# Patient Record
Sex: Male | Born: 1950
Health system: Southern US, Community
[De-identification: ages and names within clinical notes are randomized; demographics above are authoritative.]

## PROBLEM LIST (undated history)

## (undated) DIAGNOSIS — I219 Acute myocardial infarction, unspecified: Secondary | ICD-10-CM

## (undated) DIAGNOSIS — F1990 Other psychoactive substance use, unspecified, uncomplicated: Secondary | ICD-10-CM

## (undated) DIAGNOSIS — T7840XA Allergy, unspecified, initial encounter: Secondary | ICD-10-CM

## (undated) DIAGNOSIS — R0600 Dyspnea, unspecified: Secondary | ICD-10-CM

## (undated) DIAGNOSIS — G4733 Obstructive sleep apnea (adult) (pediatric): Secondary | ICD-10-CM

## (undated) DIAGNOSIS — I739 Peripheral vascular disease, unspecified: Secondary | ICD-10-CM

## (undated) DIAGNOSIS — B029 Zoster without complications: Secondary | ICD-10-CM

## (undated) DIAGNOSIS — R7303 Prediabetes: Secondary | ICD-10-CM

## (undated) DIAGNOSIS — Z8489 Family history of other specified conditions: Secondary | ICD-10-CM

## (undated) DIAGNOSIS — R600 Localized edema: Secondary | ICD-10-CM

## (undated) DIAGNOSIS — I2510 Atherosclerotic heart disease of native coronary artery without angina pectoris: Secondary | ICD-10-CM

## (undated) DIAGNOSIS — Z95810 Presence of automatic (implantable) cardiac defibrillator: Secondary | ICD-10-CM

## (undated) DIAGNOSIS — I509 Heart failure, unspecified: Secondary | ICD-10-CM

## (undated) DIAGNOSIS — G4730 Sleep apnea, unspecified: Secondary | ICD-10-CM

## (undated) DIAGNOSIS — I359 Nonrheumatic aortic valve disorder, unspecified: Secondary | ICD-10-CM

## (undated) DIAGNOSIS — F40243 Fear of flying: Secondary | ICD-10-CM

## (undated) DIAGNOSIS — D649 Anemia, unspecified: Secondary | ICD-10-CM

## (undated) DIAGNOSIS — I4891 Unspecified atrial fibrillation: Secondary | ICD-10-CM

## (undated) DIAGNOSIS — I442 Atrioventricular block, complete: Secondary | ICD-10-CM

## (undated) DIAGNOSIS — F32A Depression, unspecified: Secondary | ICD-10-CM

## (undated) DIAGNOSIS — Z950 Presence of cardiac pacemaker: Secondary | ICD-10-CM

## (undated) DIAGNOSIS — I10 Essential (primary) hypertension: Secondary | ICD-10-CM

## (undated) DIAGNOSIS — M2550 Pain in unspecified joint: Secondary | ICD-10-CM

## (undated) DIAGNOSIS — H409 Unspecified glaucoma: Secondary | ICD-10-CM

## (undated) DIAGNOSIS — R0602 Shortness of breath: Secondary | ICD-10-CM

## (undated) DIAGNOSIS — T8859XA Other complications of anesthesia, initial encounter: Secondary | ICD-10-CM

## (undated) DIAGNOSIS — M1990 Unspecified osteoarthritis, unspecified site: Secondary | ICD-10-CM

## (undated) DIAGNOSIS — F419 Anxiety disorder, unspecified: Secondary | ICD-10-CM

## (undated) DIAGNOSIS — F1010 Alcohol abuse, uncomplicated: Secondary | ICD-10-CM

## (undated) DIAGNOSIS — R011 Cardiac murmur, unspecified: Secondary | ICD-10-CM

## (undated) DIAGNOSIS — I351 Nonrheumatic aortic (valve) insufficiency: Secondary | ICD-10-CM

## (undated) HISTORY — DX: Unspecified glaucoma: H40.9

## (undated) HISTORY — DX: Alcohol abuse, uncomplicated: F10.10

## (undated) HISTORY — DX: Cardiac murmur, unspecified: R01.1

## (undated) HISTORY — PX: CATARACT EXTRACTION: SUR2

## (undated) HISTORY — DX: Presence of automatic (implantable) cardiac defibrillator: Z95.810

## (undated) HISTORY — PX: OTHER SURGICAL HISTORY: SHX169

## (undated) HISTORY — DX: Allergy, unspecified, initial encounter: T78.40XA

## (undated) HISTORY — DX: Essential (primary) hypertension: I10

## (undated) HISTORY — DX: Zoster without complications: B02.9

## (undated) HISTORY — DX: Nonrheumatic aortic (valve) insufficiency: I35.1

## (undated) HISTORY — DX: Other psychoactive substance use, unspecified, uncomplicated: F19.90

## (undated) HISTORY — DX: Obstructive sleep apnea (adult) (pediatric): G47.33

## (undated) HISTORY — PX: EYE SURGERY: SHX253

## (undated) HISTORY — PX: CARDIAC VALVE REPLACEMENT: SHX585

## (undated) HISTORY — DX: Unspecified atrial fibrillation: I48.91

## (undated) HISTORY — PX: CARDIAC CATHETERIZATION: SHX172

## (undated) HISTORY — DX: Atrioventricular block, complete: I44.2

## (undated) HISTORY — DX: Localized edema: R60.0

---
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
## 2005-10-25 ENCOUNTER — Ambulatory Visit: Payer: Self-pay | Admitting: Family Medicine

## 2005-10-25 LAB — CONVERTED CEMR LAB
ALT: 44 units/L — ABNORMAL HIGH (ref 0–40)
AST: 26 units/L (ref 0–37)
Albumin: 3.9 g/dL (ref 3.5–5.2)
Alkaline Phosphatase: 38 units/L — ABNORMAL LOW (ref 39–117)
BUN: 11 mg/dL (ref 6–23)
Basophils Absolute: 0.1 10*3/uL (ref 0.0–0.1)
Basophils Relative: 1 % (ref 0.0–1.0)
CO2: 29 meq/L (ref 19–32)
Calcium: 9.2 mg/dL (ref 8.4–10.5)
Chloride: 104 meq/L (ref 96–112)
Chol/HDL Ratio, serum: 6.4
Cholesterol: 267 mg/dL (ref 0–200)
Creatinine, Ser: 0.9 mg/dL (ref 0.4–1.5)
Eosinophil percent: 2.2 % (ref 0.0–5.0)
GFR calc non Af Amer: 92 mL/min
Glomerular Filtration Rate, Af Am: 112 mL/min/{1.73_m2}
Glucose, Bld: 92 mg/dL (ref 70–99)
HCT: 42.6 % (ref 39.0–52.0)
HDL: 41.5 mg/dL (ref >39.0)
Hemoglobin: 14.4 g/dL (ref 13.0–17.0)
LDL DIRECT: 188.2 mg/dL
Lymphocytes Relative: 34.3 % (ref 12.0–46.0)
MCHC: 33.8 g/dL (ref 30.0–36.0)
MCV: 93.4 fL (ref 78.0–100.0)
Monocytes Absolute: 0.6 10*3/uL (ref 0.2–0.7)
Monocytes Relative: 8.3 % (ref 3.0–11.0)
Neutro Abs: 3.7 10*3/uL (ref 1.4–7.7)
Neutrophils Relative %: 54.2 % (ref 43.0–77.0)
PSA: 0.34 ng/mL (ref 0.10–4.00)
Platelets: 245 10*3/uL (ref 150–400)
Potassium: 4.1 meq/L (ref 3.5–5.1)
RBC: 4.56 M/uL (ref 4.22–5.81)
RDW: 12.6 % (ref 11.5–14.6)
Sodium: 140 meq/L (ref 135–145)
TSH: 3.19 microintl units/mL (ref 0.35–5.50)
Total Bilirubin: 0.8 mg/dL (ref 0.3–1.2)
Total Protein: 6.4 g/dL (ref 6.0–8.3)
Triglyceride fasting, serum: 162 mg/dL — ABNORMAL HIGH (ref 0–149)
VLDL: 32 mg/dL (ref 0–40)
WBC: 6.8 10*3/uL (ref 4.5–10.5)

## 2005-10-29 ENCOUNTER — Ambulatory Visit: Payer: Self-pay | Admitting: Family Medicine

## 2005-12-05 ENCOUNTER — Ambulatory Visit: Payer: Self-pay | Admitting: Family Medicine

## 2006-01-10 ENCOUNTER — Ambulatory Visit: Payer: Self-pay | Admitting: Family Medicine

## 2006-10-09 DIAGNOSIS — I10 Essential (primary) hypertension: Secondary | ICD-10-CM | POA: Insufficient documentation

## 2007-12-15 ENCOUNTER — Ambulatory Visit: Payer: Self-pay | Admitting: Family Medicine

## 2007-12-15 DIAGNOSIS — B029 Zoster without complications: Secondary | ICD-10-CM | POA: Insufficient documentation

## 2008-01-08 ENCOUNTER — Ambulatory Visit: Payer: Self-pay | Admitting: Family Medicine

## 2008-01-08 DIAGNOSIS — B0229 Other postherpetic nervous system involvement: Secondary | ICD-10-CM | POA: Insufficient documentation

## 2010-02-02 NOTE — Assessment & Plan Note (Signed)
Summary: BUMPS ON BACK AND UNDER ARM/JLS   Vital Signs:  Patient Profile:   60 Years Old Male Weight:      249 pounds Temp:     98.6 degrees F oral Pulse rate:   68 / minute Pulse rhythm:   regular BP sitting:   142 / 68  (left arm)  Vitals Entered By: Kern Reap CMA (December 15, 2007 3:41 PM)                 Chief Complaint:  rash.  History of Present Illness: it is a 60 year old male, who comes in today with a rash under right armpit.  He began having pain in the 3 days later he broke out in a rash.  His pain now on a scale of one to 10 as a 5.  It's localized to the right axillary area    Prior Medication List:  ATENOLOL 50 MG  TABS (ATENOLOL) 1/2 every morning ZESTORETIC 20-25 MG  TABS (LISINOPRIL-HYDROCHLOROTHIAZIDE) every morning   Current Allergies: No known allergies   Past Medical History:    Reviewed history from 10/09/2006 and no changes required:       Hypertension       shingles   Social History:    Reviewed history and no changes required:       Occupation:       Married       Never Smoked       Alcohol use-no       Drug use-no       Regular exercise-yes   Risk Factors:  Tobacco use:  never Drug use:  no Alcohol use:  no Exercise:  yes   Review of Systems      See HPI   Physical Exam  General:     Well-developed,well-nourished,in no acute distress; alert,appropriate and cooperative throughout examination Skin:     rash consistent with herpes zoster    Impression & Recommendations:  Problem # 1:  HERPES ZOSTER (ICD-053.9) Assessment: New  Complete Medication List: 1)  Atenolol 50 Mg Tabs (Atenolol) .... 1/2 every morning 2)  Zestoretic 20-25 Mg Tabs (Lisinopril-hydrochlorothiazide) .... Every morning 3)  Zovirax 800 Mg Tabs (Acyclovir) .... Take 1 tablet by mouth three times a day 4)  Vicodin Es 7.5-750 Mg Tabs (Hydrocodone-acetaminophen) .... Take 1 tablet by mouth three times a day   Patient Instructions:  1)  begin Zovirax, 800 mg 3 times a day.  I will also give these and pain pills to take.  Return p.r.n.   Prescriptions: VICODIN ES 7.5-750 MG TABS (HYDROCODONE-ACETAMINOPHEN) Take 1 tablet by mouth three times a day  #40 x 1   Entered and Authorized by:   Roderick Pee MD   Signed by:   Roderick Pee MD on 12/15/2007   Method used:   Print then Give to Patient   RxID:   [id number] ZOVIRAX 800 MG TABS (ACYCLOVIR) Take 1 tablet by mouth three times a day  #50 x 1   Entered and Authorized by:   Roderick Pee MD   Signed by:   Roderick Pee MD on 12/15/2007   Method used:   Electronically to        [facility] [address] (retail)       [address], [address]       [place], [place]  [address]       Ph: [phone]  Fax: [phone]   RxID:   [id number]  ]

## 2010-02-02 NOTE — Assessment & Plan Note (Signed)
Summary: shingles/mhf   Vital Signs:  Patient Profile:   60 Years Old Male Weight:      250 pounds Temp:     98.3 degrees F oral BP sitting:   140 / 78  (left arm) Cuff size:   regular  Vitals Entered By: Kern Reap CMA (January 08, 2008 11:32 AM)                 Chief Complaint:  follow up shingles.  History of Present Illness: that is a 60 year old male, nonsmoker, who comes in today for reevaluation of shingles.  We saw him on December the 14th with acute shingles involving h.  He stating the Zovirax, 800 mg 3 times a day, and the red spots have premature resolved, but not completely gone.  now he has developed some tingling and pain in his posterior right shoulder where there was no rash.    Prior Medication List:  ATENOLOL 50 MG  TABS (ATENOLOL) 1/2 every morning ZESTORETIC 20-25 MG  TABS (LISINOPRIL-HYDROCHLOROTHIAZIDE) every morning ZOVIRAX 800 MG TABS (ACYCLOVIR) Take 1 tablet by mouth three times a day VICODIN ES 7.5-750 MG TABS (HYDROCODONE-ACETAMINOPHEN) Take 1 tablet by mouth three times a day   Current Allergies: No known allergies   Past Medical History:    Reviewed history from 12/15/2007 and no changes required:       Hypertension       shingles   Social History:    Reviewed history from 12/15/2007 and no changes required:       Occupation:       Married       Never Smoked       Alcohol use-no       Drug use-no       Regular exercise-yes    Review of Systems      See HPI   Physical Exam  General:     Well-developed,well-nourished,in no acute distress; alert,appropriate and cooperative throughout examination Skin:     there are still some residual zoster lesions, although there not vesiculated.  Posterior right shoulder is tender to touch.  No rash    Impression & Recommendations:  Problem # 1:  POSTHERPETIC NEURALGIA (ICD-053.19) Assessment: New  Problem # 2:  HERPES ZOSTER (ICD-053.9) Assessment: Improved   Complete Medication List: 1)  Atenolol 50 Mg Tabs (Atenolol) .... 1/2 every morning 2)  Zestoretic 20-25 Mg Tabs (Lisinopril-hydrochlorothiazide) .... Every morning 3)  Zovirax 800 Mg Tabs (Acyclovir) .... Take 1 tablet by mouth three times a day 4)  Vicodin Es 7.5-750 Mg Tabs (Hydrocodone-acetaminophen) .... Take 1 tablet by mouth three times a day 5)  Prednisone 20 Mg Tabs (Prednisone) .... Uad   Patient Instructions: 1)  take any med around of the Zovirax, 800 mg 3 times a day.  Also add prednisone two tablets daily for 3 days, one for 3 days, a half a tablet a day for 3 days, then half a tablet Monday, Wednesday, Friday, for a 3-week taper.  Return p.r.n.   Prescriptions: ZOVIRAX 800 MG TABS (ACYCLOVIR) Take 1 tablet by mouth three times a day  #50 x 1   Entered and Authorized by:   Roderick Pee MD   Signed by:   Roderick Pee MD on 01/08/2008   Method used:   Electronically to        [facility] [address] (retail)       [address], [address]  [place], [place]  [address]       Ph: [phone]       Fax: [phone]   RxID:   [id number] PREDNISONE 20 MG TABS (PREDNISONE) UAD  #50 x 1   Entered and Authorized by:   Roderick Pee MD   Signed by:   Roderick Pee MD on 01/08/2008   Method used:   Electronically to        [facility] [address] (retail)       [address]       [place], [place]  [address]       Ph: [phone]       Fax: [phone]   RxID:   [id number]  ]
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
## 2011-01-03 ENCOUNTER — Ambulatory Visit (INDEPENDENT_AMBULATORY_CARE_PROVIDER_SITE_OTHER): Payer: Self-pay | Admitting: Internal Medicine

## 2011-01-03 ENCOUNTER — Encounter: Payer: Self-pay | Admitting: Internal Medicine

## 2011-01-03 DIAGNOSIS — R011 Cardiac murmur, unspecified: Secondary | ICD-10-CM

## 2011-01-03 DIAGNOSIS — J069 Acute upper respiratory infection, unspecified: Secondary | ICD-10-CM

## 2011-01-03 MED ORDER — HYDROCODONE-HOMATROPINE 5-1.5 MG/5ML PO SYRP: 5.0000 mL | ORAL_SOLUTION | Freq: Two times a day (BID) | ORAL | Status: AC | PRN

## 2011-01-03 NOTE — Assessment & Plan Note (Signed)
61 year old white male with probable viral URI. We discussed symptomatic treatment. Patient advised to office if symptoms persist or worsen.

## 2011-01-03 NOTE — Assessment & Plan Note (Signed)
Patient with new heart murmur.  He denies chest pain but has dyspnea on exertion which he attributes to obesity.  Patient advised to follow up with PCP re: murmur and possible evaluation with 2 D Echo.

## 2011-01-03 NOTE — Progress Notes (Signed)
  Subjective:    Patient ID: Gregory [name], male    DOB: 1950/05/02, 61 y.o.   MRN: [id number]  URI  This is a new problem. The current episode started in the past 7 days. There has been no fever. Associated symptoms include congestion, coughing and a sore throat. Pertinent negatives include no diarrhea or vomiting. He has tried nothing for the symptoms.      Review of Systems  HENT: Positive for congestion and sore throat.   Respiratory: Positive for cough.   Gastrointestinal: Negative for vomiting and diarrhea.       Past Medical History  Diagnosis Date  . Hypertension   . Shingles     History   Social History  . Marital Status: Married    Spouse Name: N/A    Number of Children: N/A  . Years of Education: N/A   Occupational History  . Not on file.   Social History Main Topics  . Smoking status: Never Smoker   . Smokeless tobacco: Not on file  . Alcohol Use: No  . Drug Use: No  . Sexually Active:    Other Topics Concern  . Not on file   Social History Narrative  . No narrative on file    No past surgical history on file.  No family history on file.  No Known Allergies  No current outpatient prescriptions on file prior to visit.    BP 152/74  Temp(Src) 98.2 F (36.8 C) (Oral)  Ht 5\' 8"  (1.727 m)  Wt 255 lb (115.667 kg)  BMI 38.77 kg/m2    Objective:   Physical Exam  Constitutional: He appears well-developed and well-nourished.  HENT:  Head: Normocephalic and atraumatic.  Right Ear: External ear normal.  Left Ear: External ear normal.  Mouth/Throat: No oropharyngeal exudate.       Slight oropharyngeal erythema  Cardiovascular: Normal rate and regular rhythm.        Systolic ejection murmur 2/6 right sternal border, lower frequency  Pulmonary/Chest: Effort normal and breath sounds normal. No respiratory distress. He has no wheezes. He has no rales.  Lymphadenopathy:    He has no cervical adenopathy.  Skin: Skin is warm and dry.    Psychiatric: He has a normal mood and affect. His behavior is normal.       Assessment & Plan:

## 2011-01-03 NOTE — Patient Instructions (Signed)
Please follow up with Dr. Tawanna Cooler within 1-2 weeks re:  Elevated blood pressure and heart murmur Please call our office if your symptoms do not improve or gets worse.

## 2011-01-17 ENCOUNTER — Other Ambulatory Visit (INDEPENDENT_AMBULATORY_CARE_PROVIDER_SITE_OTHER): Payer: Self-pay

## 2011-01-17 DIAGNOSIS — Z0000 Encounter for general adult medical examination without abnormal findings: Secondary | ICD-10-CM

## 2011-01-17 LAB — LIPID PANEL
Cholesterol: 218 mg/dL — ABNORMAL HIGH (ref 0–200)
HDL: 47.5 mg/dL
Total CHOL/HDL Ratio: 5
Triglycerides: 135 mg/dL (ref 0.0–149.0)
VLDL: 27 mg/dL (ref 0.0–40.0)

## 2011-01-17 LAB — CBC WITH DIFFERENTIAL/PLATELET
Basophils Absolute: 0 10*3/uL (ref 0.0–0.1)
Basophils Relative: 0.4 % (ref 0.0–3.0)
Eosinophils Absolute: 0.2 10*3/uL (ref 0.0–0.7)
Eosinophils Relative: 2.5 % (ref 0.0–5.0)
HCT: 38.9 % — ABNORMAL LOW (ref 39.0–52.0)
Hemoglobin: 13 g/dL (ref 13.0–17.0)
Lymphocytes Relative: 23.1 % (ref 12.0–46.0)
Lymphs Abs: 1.9 10*3/uL (ref 0.7–4.0)
MCHC: 33.4 g/dL (ref 30.0–36.0)
MCV: 96.1 fl (ref 78.0–100.0)
Monocytes Absolute: 0.6 10*3/uL (ref 0.1–1.0)
Monocytes Relative: 6.8 % (ref 3.0–12.0)
Neutro Abs: 5.7 10*3/uL (ref 1.4–7.7)
Neutrophils Relative %: 67.2 % (ref 43.0–77.0)
Platelets: 276 10*3/uL (ref 150.0–400.0)
RBC: 4.05 Mil/uL — ABNORMAL LOW (ref 4.22–5.81)
RDW: 13.4 % (ref 11.5–14.6)
WBC: 8.4 10*3/uL (ref 4.5–10.5)

## 2011-01-17 LAB — HEPATIC FUNCTION PANEL
ALT: 35 U/L (ref 0–53)
AST: 26 U/L (ref 0–37)
Albumin: 3.8 g/dL (ref 3.5–5.2)
Alkaline Phosphatase: 44 U/L (ref 39–117)
Bilirubin, Direct: 0.1 mg/dL (ref 0.0–0.3)
Total Bilirubin: 0.7 mg/dL (ref 0.3–1.2)
Total Protein: 6.4 g/dL (ref 6.0–8.3)

## 2011-01-17 LAB — BASIC METABOLIC PANEL
BUN: 12 mg/dL (ref 6–23)
CO2: 29 mEq/L (ref 19–32)
Calcium: 8.7 mg/dL (ref 8.4–10.5)
Chloride: 104 mEq/L (ref 96–112)
Creatinine, Ser: 0.8 mg/dL (ref 0.4–1.5)
GFR: 101.05 mL/min (ref >60.00)
Glucose, Bld: 98 mg/dL (ref 70–99)
Potassium: 4.4 mEq/L (ref 3.5–5.1)
Sodium: 142 mEq/L (ref 135–145)

## 2011-01-17 LAB — POCT URINALYSIS DIPSTICK
Bilirubin, UA: NEGATIVE
Blood, UA: NEGATIVE
Glucose, UA: NEGATIVE
Ketones, UA: NEGATIVE
Leukocytes, UA: NEGATIVE
Nitrite, UA: NEGATIVE
Protein, UA: NEGATIVE
Spec Grav, UA: 1.02
Urobilinogen, UA: 0.2
pH, UA: 6

## 2011-01-17 LAB — PSA: PSA: 0.44 ng/mL (ref 0.10–4.00)

## 2011-01-17 LAB — LDL CHOLESTEROL, DIRECT: Direct LDL: 145 mg/dL

## 2011-01-18 LAB — TSH: TSH: 2.73 u[IU]/mL (ref 0.35–5.50)

## 2011-02-13 ENCOUNTER — Other Ambulatory Visit: Payer: Self-pay | Admitting: Family Medicine

## 2011-02-13 ENCOUNTER — Encounter: Payer: Self-pay | Admitting: Family Medicine

## 2011-02-13 ENCOUNTER — Ambulatory Visit (INDEPENDENT_AMBULATORY_CARE_PROVIDER_SITE_OTHER): Payer: Self-pay | Admitting: Family Medicine

## 2011-02-13 DIAGNOSIS — R011 Cardiac murmur, unspecified: Secondary | ICD-10-CM

## 2011-02-13 DIAGNOSIS — I10 Essential (primary) hypertension: Secondary | ICD-10-CM

## 2011-02-13 MED ORDER — LISINOPRIL-HYDROCHLOROTHIAZIDE 20-12.5 MG PO TABS: 1.0000 | ORAL_TABLET | Freq: Every day | ORAL | Status: DC

## 2011-02-13 NOTE — Progress Notes (Signed)
  Subjective:    Patient ID: Gregory [name], male    DOB: 1950/07/12, 61 y.o.   MRN: [id number]  HPI Gregory [name] is a 61 year old married male nonsmoker who comes in today for a physical examination  I have not seen him in many years. He has a history of hypertension and was on medication which he stopped 3 years ago. His blood pressure today is 220/70. He states he's asymptomatic.  He states he was here a couple weeks ago and saw Dr. you who told him he had a heart murmur. He's never had a history of a heart murmur in the past.  He gets routine eye care, dental care, never has had a colonoscopy and tetanus booster has been greater than 10 years ago.   Review of Systems  Constitutional: Negative.   HENT: Negative.   Eyes: Negative.   Respiratory: Negative.   Cardiovascular: Negative.   Gastrointestinal: Negative.   Genitourinary: Negative.   Musculoskeletal: Negative.   Skin: Negative.   Neurological: Negative.   Hematological: Negative.   Psychiatric/Behavioral: Negative.        Objective:   Physical Exam  Constitutional: He is oriented to person, place, and time. He appears well-developed and well-nourished.  HENT:  Head: Normocephalic and atraumatic.  Right Ear: External ear normal.  Left Ear: External ear normal.  Nose: Nose normal.  Mouth/Throat: Oropharynx is clear and moist.  Eyes: Conjunctivae and EOM are normal. Pupils are equal, round, and reactive to light.  Neck: Normal range of motion. Neck supple. No JVD present. No tracheal deviation present. No thyromegaly present.  Cardiovascular: Normal rate, regular rhythm and intact distal pulses.  Exam reveals no gallop and no friction rub.   Murmur heard.      There is a grade 3/6 systolic ejection murmur heard best at the aortic area  BP 220/70 at 4:12 PM he was given 0.4 mg of clonidine and 10 mg of a beta blocker and blood pressure was monitored every 15 minutes x4  Pulmonary/Chest: Effort normal and breath sounds  normal. No stridor. No respiratory distress. He has no wheezes. He has no rales. He exhibits no tenderness.  Abdominal: Soft. Bowel sounds are normal. He exhibits no distension and no mass. There is no tenderness. There is no rebound and no guarding.  Genitourinary: Rectum normal, prostate normal and penis normal. Guaiac negative stool. No penile tenderness.  Musculoskeletal: Normal range of motion. He exhibits no edema and no tenderness.  Lymphadenopathy:    He has no cervical adenopathy.  Neurological: He is alert and oriented to person, place, and time. He has normal reflexes. No cranial nerve deficit. He exhibits normal muscle tone.  Skin: Skin is warm and dry. No rash noted. No erythema. No pallor.  Psychiatric: He has a normal mood and affect. His behavior is normal. Judgment and thought content normal.          Assessment & Plan:  Hypertension,,,,,,,,,,,,, begin Zestoretic monitor BP followup in 48 hours  Obesity weight 258 pounds height 67-1/2 inches tall recommend diet exercise and weight loss  Heart murmur set up for a 2-D echo for eval

## 2011-02-13 NOTE — Patient Instructions (Signed)
Take one at the Zestoretic tablet when you get home now then starting tomorrow morning one tablet every morning  Check your blood pressure 3 times daily  Complete salt free diet  Return on Thursday for followup with you all your blood pressure readings and the device  We will get you set up in cardiology to evaluate your heart murmur

## 2011-02-15 ENCOUNTER — Ambulatory Visit (INDEPENDENT_AMBULATORY_CARE_PROVIDER_SITE_OTHER): Payer: Self-pay | Admitting: Family Medicine

## 2011-02-15 ENCOUNTER — Encounter: Payer: Self-pay | Admitting: Family Medicine

## 2011-02-15 DIAGNOSIS — I10 Essential (primary) hypertension: Secondary | ICD-10-CM

## 2011-02-15 DIAGNOSIS — I359 Nonrheumatic aortic valve disorder, unspecified: Secondary | ICD-10-CM

## 2011-02-15 DIAGNOSIS — I351 Nonrheumatic aortic (valve) insufficiency: Secondary | ICD-10-CM | POA: Insufficient documentation

## 2011-02-15 NOTE — Progress Notes (Signed)
  Subjective:    Patient ID: Gregory [name], male    DOB: 1950/05/24, 61 y.o.   MRN: [id number]  HPI  ed is a 61 year old male who comes in today for evaluation of hypertension  We saw him the other day for the first time in many years. His blood pressure was up and he had a new heart murmur. We started Zestoretic 20-12.5 daily BP now 160/70. Pressure the other day was 220 systolic no side effects from medication  Echocardiogram set up for tomorrow  Review of Systems Cardiopulmonary his systems otherwise negative    Objective:   Physical Exam  Well-developed well-nourished male in no acute distress cardiac exam shows a murmur consistent with aortic insufficiency grade 3/6 BP right arm sitting position 160/70      Assessment & Plan:  Hypertension with aortic insufficiency continue current medication echocardiogram tomorrow followup after echo

## 2011-02-15 NOTE — Patient Instructions (Signed)
I will call you I gets a report on your echocardiogram

## 2011-02-16 ENCOUNTER — Other Ambulatory Visit: Payer: Self-pay

## 2011-02-16 ENCOUNTER — Ambulatory Visit (HOSPITAL_COMMUNITY): Payer: Self-pay | Attending: Family Medicine | Admitting: Radiology

## 2011-02-16 DIAGNOSIS — R011 Cardiac murmur, unspecified: Secondary | ICD-10-CM | POA: Insufficient documentation

## 2011-02-16 DIAGNOSIS — Z6838 Body mass index (BMI) 38.0-38.9, adult: Secondary | ICD-10-CM | POA: Insufficient documentation

## 2011-02-16 DIAGNOSIS — I10 Essential (primary) hypertension: Secondary | ICD-10-CM | POA: Insufficient documentation

## 2011-02-21 ENCOUNTER — Other Ambulatory Visit: Payer: Self-pay | Admitting: Family Medicine

## 2011-02-21 DIAGNOSIS — R011 Cardiac murmur, unspecified: Secondary | ICD-10-CM

## 2011-02-21 DIAGNOSIS — I351 Nonrheumatic aortic (valve) insufficiency: Secondary | ICD-10-CM

## 2011-03-01 ENCOUNTER — Institutional Professional Consult (permissible substitution) (INDEPENDENT_AMBULATORY_CARE_PROVIDER_SITE_OTHER): Payer: Self-pay | Admitting: Cardiothoracic Surgery

## 2011-03-01 ENCOUNTER — Encounter: Payer: Self-pay | Admitting: Cardiothoracic Surgery

## 2011-03-01 VITALS — BP 146/62 | HR 62 | Resp 16 | Ht 67.5 in | Wt 258.0 lb

## 2011-03-01 DIAGNOSIS — I359 Nonrheumatic aortic valve disorder, unspecified: Secondary | ICD-10-CM

## 2011-03-01 NOTE — Patient Instructions (Addendum)
Start Good dental Care see dentist Will make appointment with Cardiology FU echo 3 months then see me   Aortic Insufficiency  Aortic insufficiency (AI) is a condition where the valve between the heart and the aorta (the big vessel that pumps blood to the entire body) does not close well enough. This means the heart has to work harder to pump the same amount of blood than it would if the valve closed tightly. Every time the heart beats, some of the blood leaks back into the heart. This would be like bailing out a boat with a leaky bucket. Over time, this condition leads to high blood pressure and eventually causes the heart to fail. CAUSES  Anything that weakens the aortic valve can cause AI. Examples include:  Rheumatic fever.   Congenital (present at birth) valve abnormalities.   Aortic aneurysm (a ballooning of a weak spot in the vessel wall).   Syphilis.   Some collagen diseases and genetic problems.  AI also can be caused by infection or injury or can develop following the repair of aortic stenosis (a narrowing of the valve that does not let enough blood through). SYMPTOMS  Weakness and fatigue.   Shortness of breath.   Needing to sleep on 2 or more pillows at night to breathe better.   Chest discomfort.   Head bobbing.  DIAGNOSIS  The diagnosis of AI can usually be made with a physical exam and an echocardiogram. An echocardiogram is a test that uses ultrasound to examine the heart. Other tests may also be done to confirm the diagnosis. TREATMENT   If there are mild to no symptoms, only observation may be needed. With severe problems, hospitalization may be necessary.   Medications may be used to prevent an infection forming on the valves, to keep symptoms from getting worse or to delay surgery for 2 or 3 years. Some medications commonly used for AI help the heart work better.   Surgery to repair or replace the valve is usually reserved for last. Surgery results in better  outcomes if not delayed too long.   Sudden onset of AI may require urgent surgery to replace the valve.  HOME CARE INSTRUCTIONS   Echocardiograms (a sound wave picture of the heart and great vessels) are done periodically to monitor treatment.   Notify the health care provider or dentist about any history of heart valve disease before treatment for any condition. Any dental work, including cleaning, and any invasive procedure can introduce bacteria into the bloodstream. Bacteria can infect a weakened valve causing endocarditis.   Take any medications as prescribed.  SEEK MEDICAL CARE IF:  Chest or breathing problems that get worse occur.   You notice irregular heartbeats.   You have unexplained fevers.  SEEK IMMEDIATE MEDICAL CARE IF:   You have new or severe shortness of breath.   You experience lightheadedness.   New or severe chest pains occur.   There are rapid or irregular heartbeats related to the above symptoms.  MAKE SURE YOU:   Understand these instructions.   Will watch your condition.   Will get help right away if you are not doing well or get worse.  Document Released: 06/24/2002 Document Revised: 08/30/2010 Document Reviewed: 11/06/2006 [facility] Patient Information 2012 [place], [name].  Aortic Valve Replacement You have a disease of one of the valves of your heart. In you or your child's case, it is the aortic valve which needs replacing. Aortic valve replacement is open heart surgery done by  a heart surgeon. This operation treats problems with the aortic valve. The aortic valve is the "outflow valve" for the left side of the heart. The left side of your heart (left ventricle) is the large muscular part of the heart that pumps blood to the rest of the body. It separates the left ventricle from the aorta. When the heart squeezes down (contracts), the aortic valve is what keeps the blood from flowing back into the ventricle from the aorta. This allows the blood to  keep moving through the body.  Surgery may be necessary when the valve does not open or close completely. A stenotic (narrow) valve does not let the blood leave the heart normally. This causes blood to back up in the left ventricle. This makes it hard for the heart to increase the amount of blood that it pumps. The heart has to work harder. This may produce shortness of breath and fatigue. Problems are worse with activity.  If the valve leaflets do not meet correctly when closing, blood may leak backward into the ventricle each time the heart pumps. This is called aortic insufficiency. When some of the blood leaks backwards, the heart has to work even harder. The heart can allow for this over-work for a long time if the leakage came on slowly. Eventually, the heart fails.  Aortic valve problems may be caused by a birth defect. This is called congenital. Wear and tear can cause valves to fail. More commonly, rheumatic fever may damage the aortic valve. Occasionally, the valve may be damaged by infection. This also causes the aortic valve to leak.  DESCRIPTION OF SURGERY Aortic valves can be repaired. When the valve is too damaged to repair, the valve must be replaced. A prosthetic (artificial) valve is used to do this. Valves damaged by rheumatic disease often must be replaced.  Two types of artificial valves are available:  Mechanical valves made entirely from man-made materials.   Biological valves which are made from animal tissues or taken from a cadaver.  Each has advantages and disadvantages. The choice of which type to use should be made by you and your surgeon. Your risks, age, lifestyle, other medical problems including the decision on whether to be on blood thinners the rest of your life all will help you decide on which type of valve to use. There are a number of good MECHANICAL PROSTHESES available. All work well. The main advantage of mechanical valves is that they do not wear out. Their main  disadvantage is that blood clots easier on mechanical valves. If this happens the valve will not work normally. Because of this, patients with mechanical valves must take anticoagulants (blood thinners) for life. There is also a small but definite risk of blood clots causing stroke, even when taking anticoagulants.  There are a number of BIOLOGICAL CHOICES for aortic valve replacement. Most are made from pig aortic valves. Some are taken from cadavers. The main advantage is that they have a reduced risk of blood clots forming on the valve. This lessens the chance of the valve not working or causing a stroke. A large disadvantage of biological or tissue valves is that they wear out sooner than mechanical valves. The rate at which they wear out depends on the patient's age. A young boy might wear out such a valve in only a few years. The same valve might last 10 years in a middle aged person, and even longer in a patient over the age of 65. A tissue  valve used in a person over 76 years old may never need replacement. RISKS AND COMPLICATIONS Your cardiologist and cardiothoracic surgeon can best determine your individual risk. It will depend on your age, general condition, medical conditions, and your heart function. In general, the risks include:  Problems from the operation itself are low risk. Some common risks are:   Risks from the anesthesia.   Bleeding and infection.   Lifelong treatment with medications to prevent blood clots is needed for mechanical valve replacements.   Infection is more common with valve replacement than with valve repair.   Valve failure is more common with valve replacement than with valve repair. Pig valves tend to fail after about 8 to 10 years.  PROCEDURE  Valve repair or replacement is open-heart surgery. You are given general anesthesia (medications to help you sleep). You are then placed on a heart-lung machine. This machine provides oxygen to your blood while the  heart is not working. The surgery generally lasts from 3 to 5 hours. During surgery, the surgeon makes a large incision (cut) in the chest. Sometimes the heart is cooled to slow or stop the heartbeat. The damaged aortic valve is either repaired or removed and replaced with an artificial heart valve. AFTER THE PROCEDURE  Recovery from heart valve surgery usually involves a few days in an intensive care unit (ICU) of a hospital. Full recovery from heart valve surgery can take several months.   Anticoagulation (blood thinning) treatment with warfarin is often prescribed for 6 weeks to 3 months after surgery for those with biological valves. It is prescribed for life for those with mechanical valves.   Recovery includes healing of the surgical incision. There is a gradual building of stamina and exercise abilities. An exercise program under the direction of a physical therapist may be recommended.   Once you have an artificial valve, your heart function and your life will return to normal. You usually feel better after surgery. Shortness of breath and fatigue should lessen. If your heart was already severely damaged before your surgery, you may continue to have problems.   You can usually resume most of your normal activities. You will have to continue to monitor your condition. You need to watch out for blood clots and infections.   Artificial valves need to be replaced after a period of time. It is important that you see your caregiver regularly.   Some individuals with an aortic valve replacement need to take antibiotics before having dental work or other surgical procedures. This is called prophylactic antibiotic treatment. These drugs help to prevent infective endocarditis. Antibiotics are only recommended for individuals with the highest risk for developing infective endocarditis. Let your dentist and your caregiver know if you have a history of any of the following so that the necessary precautions  can be taken:   A VSD.   A repaired VSD.   Endocarditis in the past.   An artificial (prosthetic) heart valve.  HOME CARE INSTRUCTIONS   Use all medications as prescribed.   Take your temperature every morning for the first week after surgery. Record these.   Weigh yourself every morning for at least the first week after surgery and record.   Do not lift more than 10 pounds (4.5 kg) until your sternum (breastbone) has healed. Avoid all activities which would place strain on your incision.   You may shower but do not take baths until instructed by your caregivers.   Avoid driving for 4 to 6  weeks following surgery or as instructed.   Use your elastic stockings during the day. You should wear the stockings for at least 2 weeks after discharge or longer if your ankles are swollen. The stockings help blood flow and help reduce swelling in the legs. It is easiest to put the stockings on before you get out of bed in the morning. They should fit snugly.  SEEK IMMEDIATE MEDICAL CARE IF:  You develop chest pain which is not coming from your incision (surgical cut) .   You develop shortness of breath.   You develop a temperature over 101 F (38.3 C).   You have a sudden weight gain. Let your caregiver know what the weight gain is.  Document Released: 05/09/2004 Document Revised: 08/30/2010 Document Reviewed: 12/15/2007 [facility] Patient Information 2012 [place], [name].

## 2011-03-01 NOTE — Progress Notes (Signed)
[address]            [place] [address]          [phone]      TABITHA T[place] Medical Record #[id number] Date of Birth: 12/26/1950  Referring: Evette Georges, MD Primary Care: Evette Georges, MD, MD No Cardiology  Chief Complaint:    Chief Complaint  Patient presents with  . Heart Murmur    ECHO 02/16/11...aortic valve regurgitation....eval and treat  . Shortness of Breath    History of Present Illness:    Patient is a 61 year old male with no known previous cardiac history. He noted about 6 weeks ago some flulike symptoms and went to see his primary care physician. He was noted to be very hypertensive with a blood pressure of 210/90 and a cardiac murmur was noted. With no previous history of echocardiogram or previous history of murmur patient was sent for an echocardiogram which demonstrated aortic insufficiency. The patient notes that his flulike symptoms had dissipated he denies any fever chills or other signs or symptoms of endocarditis. He does have some exertion related shortness of breath but his remains very active. 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 He notes only mild pedal edema.      Current Activity/ Functional Status: Patient is independent with mobility/ambulation, transfers, ADL's, IADL's.   Past Medical History  Diagnosis Date  . Hypertension poor control recently started back taking meds   . Shingles      surgical history :. Broken ankle and rib fractures   family history :. Patient adopted, biologic mother died of breast cancer 06-Jun-2022, sister has breast cancer, unknow histor of father  History   Social History  . Marital Status: Married    Spouse Name: N/A    Number of Children: N/A  . Years of Education: N/A   Occupational History  works Holiday representative      Social History Main Topics  . Smoking status: Never Smoker    . Smokeless tobacco: Never Used  . Alcohol Use: Yes daily beer drinker  . Drug Use: No   Other Topics Concern  . Not on file   Social History Narrative  . Married lives with wife one daughter now nursing student at [employer]    History  Smoking status  . Never Smoker   Smokeless tobacco  . Never Used    History  Alcohol Use  . Yes     No Known Allergies  Current Outpatient Prescriptions  Medication Sig Dispense Refill  . aspirin EC 325 MG tablet Take 325 mg by mouth daily.      [name] lisinopril-hydrochlorothiazide (ZESTORETIC) 20-12.5 MG per tablet Take 1 tablet by mouth daily.  90 tablet  3       Review of Systems:     Cardiac Review of Systems: Y or N  Chest Pain [ n   ]  Resting SOB [ n  ] Exertional SOB  Cove.Etienne  ]  Orthopnea Cove.Etienne  ]   Pedal Edema [ mild  ]    Palpitations Milo.Brash  ] Syncope  [  n]   Presyncope [ n  ]  General Review of Systems: [Y] = yes [  ]=no Constitional: recent weight change [ gaining wt ]; anorexia [ n ]; fatigue [ n ]; nausea [  n  ]; night sweats [n  ]; fever [ n ]; or chills [n  ];                                                                                                                                          Dental: poor dentition[ very poor ]; Last Dentist visit:many  years  Eye : blurred vision [  ]; diplopia [   ]; vision changes [  ];  Amaurosis fugax[  ]; Resp: cough [  ];  wheezing[n  ];  hemoptysis[ n ]; shortness of breath[ y ]; paroxysmal nocturnal dyspnea[n  ]; dyspnea on exertion[ y ]; or orthopnea[y  ];  GI:  gallstones[  ], vomiting[  ];  dysphagia[ n ]; melena[n  ];  hematochezia [ n ]; heartburn[  ];   Hx of  Colonoscopy[ n- has scheduled next month ]; GU: kidney stones [  ]; hematuria[ n ];   dysuria [  ];  nocturia[  ];  history of     obstruction [  ];             Skin: rash, swelling[  ];, hair loss[  ];  peripheral edema[  ];  or itching[  ]; Musculosketetal: myalgias[  ];  joint swelling[  ];  joint erythema[  ];  joint pain[  ];   back pain[  ];  Heme/Lymph: bruising[  ];  bleeding[  ];  anemia[  ];  Neuro: TIA[ n ];  headaches[  ];  stroke[ n ];  vertigo[n  ];  seizures[  ];   paresthesias[  ];  difficulty walking[  ];  Psych:depression[  ]; anxiety[  ];  Endocrine: diabetes[  ];  thyroid dysfunction[  ];  Immunizations: Flu [ n ]; Pneumococcal[n  ];  Other:  Physical Exam: BP 146/62  Pulse 62  Resp 16  Ht 5' 7.5" (1.715 m)  Wt 258 lb (117.028 kg)  BMI 39.81 kg/m2  SpO2 93%  General appearance: alert, cooperative and no distress Neurologic: intact Heart: diastolic murmur: holodiastolic 3/6, blowing throughout the precordium Lungs: clear to auscultation bilaterally Abdomen: soft, non-tender; bowel sounds normal; no masses,  no organomegaly and marked obesity Extremities: extremities normal, atraumatic, no cyanosis or edema and Homans sign is negative, no sign of DVT no carotid bruits   Diagnostic Studies & Laboratory data:     Recent Radiology Findings:   No results found.  Recent Lab Findings: Lab Results  Component Value Date   WBC 8.4 01/17/2011   HGB 13.0 01/17/2011   HCT 38.9* 01/17/2011   PLT 276.0 01/17/2011   GLUCOSE 98 01/17/2011   CHOL 218* 01/17/2011   TRIG 135.0 01/17/2011   HDL 47.50 01/17/2011   LDLDIRECT 145.0 01/17/2011   ALT 35 01/17/2011   AST 26 01/17/2011   NA 142 01/17/2011   K 4.4 01/17/2011   CL 104  01/17/2011   CREATININE 0.8 01/17/2011   BUN 12 01/17/2011   CO2 29 01/17/2011   TSH 2.73 01/17/2011   ECHO: Echocardiography  Patient: Kendrew, Paci MR #: [id number] Study Date: 02/16/2011 Gender: M Age: 66 Height: 172.7cm Weight: 113.4kg BSA: 2.48m^2 Pt. Status: Room:  ATTENDING Evette Georges ORDERING Evette Georges REFERRING Evette Georges PERFORMING [facility], [facility] SONOGRAPHER Junious Dresser, RDCS cc:  ------------------------------------------------------------ LV EF: 50% -  55%  ------------------------------------------------------------ Indications: Murmur 785.2.  ------------------------------------------------------------ History: PMH: Acquired from the patient and from the patient's chart. Murmur. Accelerated hypertension. Risk factors: Hypertension. Morbidly obese.  ------------------------------------------------------------ Study Conclusions  - Left ventricle: Wall thickness was increased in a pattern of mild LVH. Systolic function was normal. The estimated ejection fraction was in the range of 50% to 55%. - Aortic valve: Moderate to severe regurgitation. Mean gradient: 14mm Hg (S). Peak gradient: 28mm Hg (S). Echocardiography. M-mode, complete 2D, spectral Doppler, and color Doppler. Height: Height: 172.7cm. Height: 68in. Weight: Weight: 113.4kg. Weight: 249.5lb. Body mass index: BMI: 38kg/m^2. Body surface area: BSA: 2.66m^2. Blood pressure: 176/72. Patient status: Outpatient. Location: [place] [facility]  ------------------------------------------------------------  ------------------------------------------------------------ Left ventricle: Wall thickness was increased in a pattern of mild LVH. Systolic function was normal. The estimated ejection fraction was in the range of 50% to 55%. Early diastolic septal annular tissue Doppler velocities Ea were abnormal.  ------------------------------------------------------------ Aortic valve: Mildly thickened, mildly calcified leaflets. Doppler: Moderate to severe regurgitation. VTI ratio of LVOT to aortic valve: 0.61. Valve area: 3.21cm^2(VTI). Indexed valve area: 1.43cm^2/m^2 (VTI). Peak velocity ratio of LVOT to aortic valve: 0.55. Valve area: 2.91cm^2 (Vmax). Indexed valve area: 1.29cm^2/m^2 (Vmax). Mean gradient: 14mm Hg (S). Peak gradient: 28mm Hg (S).  ------------------------------------------------------------ Aorta: Ascending aorta: The ascending aorta was  mildly dilated.  ------------------------------------------------------------ Mitral valve: Structurally normal valve. Leaflet separation was normal. Doppler: Transvalvular velocity was within the normal range. There was no evidence for stenosis. No regurgitation.  ------------------------------------------------------------ Left atrium: The atrium was normal in size.  ------------------------------------------------------------ Right ventricle: The cavity size was normal. Wall thickness was normal. Systolic function was normal.  ------------------------------------------------------------ Pulmonic valve: Doppler: No significant regurgitation.  ------------------------------------------------------------ Tricuspid valve: Doppler: Trivial regurgitation.  ------------------------------------------------------------ Right atrium: The atrium was normal in size.  ------------------------------------------------------------ Pericardium: There was no pericardial effusion.  ------------------------------------------------------------  2D measurements Normal Doppler measurements Normal Left ventricle Left ventricle LVID ED, 52.6 mm 43-52 Ea, lat 3.95 cm/s ------ chord, ann, tiss PLAX DP LVID ES, 31.4 mm 23-38 E/Ea, lat 14.9 ------ chord, ann, tiss 9 PLAX DP FS, chord, 40 % >29 Ea, med 6.25 cm/s ------ PLAX ann, tiss LVPW, ED 13.7 mm ------ DP IVS/LVPW 1.45 <1.3 E/Ea, med 9.47 ------ ratio, ED ann, tiss Vol ED, 329 ml ------ DP MOD1 LVOT Vol ES, 127 ml ------ Peak vel, 145 cm/s ------ MOD1 S EF, MOD1 62 % ------ VTI, S 32.8 cm ------ Vol index, 146 ml/m^2 ------ Peak 8 mm Hg ------ ED, MOD1 gradient, Vol index, 56 ml/m^2 ------ S ES, MOD1 Stroke vol 174. ml ------ Vol ED, 284 ml ------ 1 MOD2 Stroke 77.4 ml/m^2 ------ Vol ES, 127 ml ------ index MOD2 Aortic valve EF, MOD2 55 % ------ Peak vel, 265 cm/s ------ Stroke 157 ml ------ S vol, MOD2 Mean vel, 174 cm/s  ------ Vol index, 126 ml/m^2 ------ S ED, MOD2 VTI, S 54.2 cm ------ Vol index, 56 ml/m^2 ------ Mean 14 mm Hg ------ ES, MOD2 gradient, Stroke 69.8 ml/m^2 ------ S index,  Peak 28 mm Hg ------ MOD2 gradient, Ventricular septum S IVS, ED 19.8 mm ------ VTI ratio 0.61 ------ LVOT LVOT/AV Diam, S 26 mm ------ Area, VTI 3.21 cm^2 ------ Area 5.31 cm^2 ------ Area index 1.43 cm^2/m ------ Diam 26 mm ------ (VTI) ^2 Aorta Peak vel 0.55 ------ Root diam, 44 mm ------ ratio, ED LVOT/AV AAo AP 45 mm ------ Area, Vmax 2.91 cm^2 ------ diam, S Area index 1.29 cm^2/m ------ Left atrium (Vmax) ^2 AP dim 36 mm ------ Regurg PHT 356 ms ------ AP dim 1.6 cm/m^2 <2.2 Mitral valve index Peak E vel 59.2 cm/s ------ Peak A vel 118 cm/s ------ Decelerati 285 ms 150-23 on time 0 Peak E/A 0.5 ------ ratio Right ventricle Sa vel, 15.8 cm/s ------ lat ann, tiss DP     Assessment / Plan:     #1 new discovery of aortic insufficiency, moderate to severe with 55% ejection fraction end diastolic LV dimension 52.6 mm left ventricular dimension end systole 31.4, peak velocity across aortic valve 265 cm/s #2 poor dentition with abnormal aortic valve #3 poorly controlled hypertension, now back on meds and compliant #4 obesity  I have reviewed with the patient the diagnosis of aortic insufficiency at this point without evidence of LV enlargement, and no baseline study to compare I would recommend reevaluation with echocardiogram in 3 months. The patient has not seen cardiology this will be arranged.  Have discussed with him the need to start good dental care and to have his poor dentition taking care of her for consideration of aortic valve repair or replacement. The risk of endocarditis with abnormal valve and poor dentition was strongly stressed to the patient.         Delight Ovens MD  Beeper [phone] Office [phone] 03/01/2011 11:36 AM

## 2011-03-02 ENCOUNTER — Telehealth: Payer: Self-pay | Admitting: Family Medicine

## 2011-03-02 NOTE — Telephone Encounter (Signed)
Spoke with wife

## 2011-03-02 NOTE — Telephone Encounter (Signed)
The cardiac appt. Has been made w/ Dr. Antoine Poche...wife just wanted to let Dr.Todd know this..please give her a call

## 2011-03-22 ENCOUNTER — Encounter: Payer: Self-pay | Admitting: Gastroenterology

## 2011-03-26 ENCOUNTER — Ambulatory Visit (INDEPENDENT_AMBULATORY_CARE_PROVIDER_SITE_OTHER): Payer: Self-pay | Admitting: Cardiology

## 2011-03-26 ENCOUNTER — Encounter: Payer: Self-pay | Admitting: Cardiology

## 2011-03-26 VITALS — BP 140/70 | HR 64 | Ht 67.0 in | Wt 257.0 lb

## 2011-03-26 DIAGNOSIS — I351 Nonrheumatic aortic (valve) insufficiency: Secondary | ICD-10-CM

## 2011-03-26 DIAGNOSIS — I10 Essential (primary) hypertension: Secondary | ICD-10-CM

## 2011-03-26 DIAGNOSIS — E66811 Obesity, class 1: Secondary | ICD-10-CM | POA: Insufficient documentation

## 2011-03-26 DIAGNOSIS — E661 Drug-induced obesity: Secondary | ICD-10-CM | POA: Insufficient documentation

## 2011-03-26 DIAGNOSIS — I359 Nonrheumatic aortic valve disorder, unspecified: Secondary | ICD-10-CM

## 2011-03-26 DIAGNOSIS — E669 Obesity, unspecified: Secondary | ICD-10-CM

## 2011-03-26 MED ORDER — AMLODIPINE BESYLATE 2.5 MG PO TABS: 2.5000 mg | ORAL_TABLET | Freq: Every day | ORAL | Status: DC

## 2011-03-26 NOTE — Assessment & Plan Note (Signed)
He will start Norvasc 2.5 mg for better blood pressure control and possibly reduce the progression of his aortic insufficiency. He will otherwise continue the medications as listed. Of note he almost definitely has sleep apnea but would like to defer a sleep study. Control of this could also control his blood pressure.

## 2011-03-26 NOTE — Assessment & Plan Note (Signed)
He has significant regurgitation and a low normal ejection fraction but normal chamber size. While he will eventually need surgery on his aortic valve I do think we can follow this and I will repeat an echo in 3 months. In the meantime he needs dental work and weight loss.

## 2011-03-26 NOTE — Assessment & Plan Note (Signed)
The patient understands the need to lose weight with diet and exercise. We have discussed specific strategies for this.  

## 2011-03-26 NOTE — Patient Instructions (Addendum)
Please start Amlodipine 2.5 mg once a day.  Continue all other medications as listed  Your physician has requested that you have an echocardiogram in 3 months. Echocardiography is a painless test that uses sound waves to create images of your heart. It provides your doctor with information about the size and shape of your heart and how well your heart's chambers and valves are working. This procedure takes approximately one hour. There are no restrictions for this procedure.  See Dr Antoine Poche in 3 months  Aortic Insufficiency  Aortic insufficiency (AI) is a condition where the valve between the heart and the aorta (the big vessel that pumps blood to the entire body) does not close well enough. This means the heart has to work harder to pump the same amount of blood than it would if the valve closed tightly. Every time the heart beats, some of the blood leaks back into the heart. This would be like bailing out a boat with a leaky bucket. Over time, this condition leads to high blood pressure and eventually causes the heart to fail. CAUSES  Anything that weakens the aortic valve can cause AI. Examples include:  Rheumatic fever.   Congenital (present at birth) valve abnormalities.   Aortic aneurysm (a ballooning of a weak spot in the vessel wall).   Syphilis.   Some collagen diseases and genetic problems.  AI also can be caused by infection or injury or can develop following the repair of aortic stenosis (a narrowing of the valve that does not let enough blood through). SYMPTOMS  Weakness and fatigue.   Shortness of breath.   Needing to sleep on 2 or more pillows at night to breathe better.   Chest discomfort.   Head bobbing.  DIAGNOSIS  The diagnosis of AI can usually be made with a physical exam and an echocardiogram. An echocardiogram is a test that uses ultrasound to examine the heart. Other tests may also be done to confirm the diagnosis. TREATMENT   If there are mild to no  symptoms, only observation may be needed. With severe problems, hospitalization may be necessary.   Medications may be used to prevent an infection forming on the valves, to keep symptoms from getting worse or to delay surgery for 2 or 3 years. Some medications commonly used for AI help the heart work better.   Surgery to repair or replace the valve is usually reserved for last. Surgery results in better outcomes if not delayed too long.   Sudden onset of AI may require urgent surgery to replace the valve.  HOME CARE INSTRUCTIONS   Echocardiograms (a sound wave picture of the heart and great vessels) are done periodically to monitor treatment.   Notify the health care provider or dentist about any history of heart valve disease before treatment for any condition. Any dental work, including cleaning, and any invasive procedure can introduce bacteria into the bloodstream. Bacteria can infect a weakened valve causing endocarditis.   Take any medications as prescribed.  SEEK MEDICAL CARE IF:  Chest or breathing problems that get worse occur.   You notice irregular heartbeats.   You have unexplained fevers.  SEEK IMMEDIATE MEDICAL CARE IF:   You have new or severe shortness of breath.   You experience lightheadedness.   New or severe chest pains occur.   There are rapid or irregular heartbeats related to the above symptoms.  MAKE SURE YOU:   Understand these instructions.   Will watch your condition.   Will  get help right away if you are not doing well or get worse.  Document Released: 06/24/2002 Document Revised: 12/07/2010 Document Reviewed: 11/06/2006 [facility] Patient Information 2012 [place], [name].

## 2011-03-26 NOTE — Progress Notes (Signed)
   HPI The patient is referred for evaluation of aortic insufficiency. He recently had hypertension and saw Dr. Tawanna Cooler.  He does not routinely follow with a primary physician. He was noted to have a murmur and was sent for an echocardiogram. This demonstrated severe aortic insufficiency with an EF of 50-55% and some mild stenosis. The end-diastolic dimension was 52 mm and end-systolic 31 mm. He was originally sent to Dr. Tyrone Sage who referred him here for continued followup.  The patient denies any cardiovascular symptoms. He doesn't exercise but he does work in [employer]. The patient denies any new symptoms such as chest discomfort, neck or arm discomfort. There has been no new shortness of breath, PND or orthopnea. There have been no reported palpitations, presyncope or syncope.  He has no edema.   No Known Allergies  Current Outpatient Prescriptions  Medication Sig Dispense Refill  . aspirin EC 325 MG tablet Take 325 mg by mouth daily.      [name] lisinopril-hydrochlorothiazide (ZESTORETIC) 20-12.5 MG per tablet Take 1 tablet by mouth daily.  90 tablet  3    Past Medical History  Diagnosis Date  . Hypertension   . Shingles     Past Surgical History  Procedure Date  . None     Family History  Problem Relation Age of Onset  . Adopted: Yes    History   Social History  . Marital Status: Married    Spouse Name: N/A    Number of Children: 3  . Years of Education: N/A   Occupational History  . Construction    Social History Main Topics  . Smoking status: Never Smoker   . Smokeless tobacco: Never Used  . Alcohol Use: Yes  . Drug Use: No  . Sexually Active: Not on file   Other Topics Concern  . Not on file   Social History Narrative   Lives at home with wife.    ROS: As stated in the HPI and negative for all other systems.  PHYSICAL EXAM BP 140/70  Pulse 64  Ht 5\' 7"  (1.702 m)  Wt 257 lb (116.574 kg)  BMI 40.25 kg/m2 GENERAL:  Well appearing HEENT:  Pupils equal  round and reactive, fundi not visualized, oral mucosa unremarkable NECK:  No jugular venous distention, waveform within normal limits, carotid upstroke brisk and symmetric, no bruits, no thyromegaly LYMPHATICS:  No cervical, inguinal adenopathy LUNGS:  Clear to auscultation bilaterally BACK:  No CVA tenderness CHEST:  Unremarkable HEART:  PMI not displaced or sustained,S1 and S2 within normal limits, no S3, no S4, no clicks, no rubs, apical systolic murmur radiating out the outflow tract.  3/6 diastolic murmur heard best at the left third interspace and terminating in mid diastole ABD:  Flat, positive bowel sounds normal in frequency in pitch, no bruits, no rebound, no guarding, no midline pulsatile mass, no hepatomegaly, no splenomegaly, obese EXT:  Pulses are brisk throughout, no edema, no cyanosis no clubbing SKIN:  No rashes no nodules NEURO:  Cranial nerves II through XII grossly intact, motor grossly intact throughout [facility]:  Cognitively intact, oriented to person place and time  EKG:  02/13/11  sinus rhythm, left ventricular hypertrophy by voltage criteria, no acute ST-T wave changes.  ASSESSMENT AND PLAN

## 2011-06-26 ENCOUNTER — Other Ambulatory Visit: Payer: Self-pay

## 2011-06-26 ENCOUNTER — Ambulatory Visit (INDEPENDENT_AMBULATORY_CARE_PROVIDER_SITE_OTHER): Payer: Self-pay | Admitting: Cardiology

## 2011-06-26 ENCOUNTER — Ambulatory Visit (HOSPITAL_COMMUNITY): Payer: Self-pay | Attending: Cardiology | Admitting: Radiology

## 2011-06-26 ENCOUNTER — Encounter: Payer: Self-pay | Admitting: Cardiology

## 2011-06-26 VITALS — BP 138/60 | HR 60 | Ht 67.0 in | Wt 258.0 lb

## 2011-06-26 DIAGNOSIS — I359 Nonrheumatic aortic valve disorder, unspecified: Secondary | ICD-10-CM

## 2011-06-26 DIAGNOSIS — I079 Rheumatic tricuspid valve disease, unspecified: Secondary | ICD-10-CM | POA: Insufficient documentation

## 2011-06-26 DIAGNOSIS — I351 Nonrheumatic aortic (valve) insufficiency: Secondary | ICD-10-CM

## 2011-06-26 DIAGNOSIS — E669 Obesity, unspecified: Secondary | ICD-10-CM

## 2011-06-26 DIAGNOSIS — I10 Essential (primary) hypertension: Secondary | ICD-10-CM | POA: Insufficient documentation

## 2011-06-26 MED ORDER — AMLODIPINE BESYLATE 5 MG PO TABS: 5.0000 mg | ORAL_TABLET | Freq: Every day | ORAL | Status: DC

## 2011-06-26 NOTE — Assessment & Plan Note (Signed)
I will increase his Norvasc 5 mg daily.

## 2011-06-26 NOTE — Assessment & Plan Note (Signed)
I will review the results of the echo today. He will eventually need valve replacement but his chamber size was not enlarged previously. He's having no symptoms and has normal LV function. I suspect we'll be able to follow this longer with repeat physical and echo examinations.

## 2011-06-26 NOTE — Patient Instructions (Addendum)
Please start Norvasc (Amlodipine) 5 mg a day Continue all other medications as listed  Follow up in 6 months with Dr Antoine Poche.  You will receive a letter in the mail 2 months before you are due.  Please call us when you receive this letter to schedule your follow up appointment.

## 2011-06-26 NOTE — Progress Notes (Signed)
    HPI The patient is referred for evaluation of aortic insufficiency. He had a repeat echo today with results pending.   At the last visit I started him on low-dose amlodipine.  He did well with this. He has had no new cardiovascular symptoms. The patient denies any new symptoms such as chest discomfort, neck or arm discomfort. There has been no new shortness of breath, PND or orthopnea. There have been no reported palpitations, presyncope or syncope.  He is limited somewhat by bilateral knee pain.   No Known Allergies  Current Outpatient Prescriptions  Medication Sig Dispense Refill  . amLODipine (NORVASC) 2.5 MG tablet Take 1 tablet (2.5 mg total) by mouth daily.  30 tablet  11  . aspirin EC 325 MG tablet Take 325 mg by mouth daily.      [name] lisinopril-hydrochlorothiazide (ZESTORETIC) 20-12.5 MG per tablet Take 1 tablet by mouth daily.  90 tablet  3  . DISCONTD: atenolol (TENORMIN) 50 MG tablet Take 50 mg by mouth daily. 1/2 tab in am        Past Medical History  Diagnosis Date  . Hypertension   . Shingles     Past Surgical History  Procedure Date  . None     ROS: As stated in the HPI and negative for all other systems.  PHYSICAL EXAM BP 138/60  Pulse 60  Ht 5\' 7"  (1.702 m)  Wt 258 lb (117.028 kg)  BMI 40.41 kg/m2 GENERAL:  Well appearing HEENT:  Pupils equal round and reactive, fundi not visualized, oral mucosa unremarkable NECK:  No jugular venous distention, waveform within normal limits, carotid upstroke brisk and symmetric, no bruits, no thyromegaly LYMPHATICS:  No cervical, inguinal adenopathy LUNGS:  Clear to auscultation bilaterally BACK:  No CVA tenderness CHEST:  Unremarkable HEART:  PMI not displaced or sustained,S1 and S2 within normal limits, no S3, no S4, no clicks, no rubs, apical systolic murmur radiating out the outflow tract.  3/6 diastolic murmur heard best at the left third interspace and terminating in mid diastole ABD:  Flat, positive bowel sounds  normal in frequency in pitch, no bruits, no rebound, no guarding, no midline pulsatile mass, no hepatomegaly, no splenomegaly, obese EXT:  Pulses are brisk throughout, no edema, no cyanosis no clubbing   EKG:  Sinus rhythm, rate 60, axis within normal limits, intervals within normal limits, no acute ST-T wave changes.  06/26/2011   ASSESSMENT AND PLAN

## 2011-06-26 NOTE — Progress Notes (Signed)
Echocardiogram performed.  

## 2011-06-26 NOTE — Assessment & Plan Note (Signed)
The patient understands the need to lose weight with diet and exercise. We have discussed specific strategies for this.  

## 2011-06-28 ENCOUNTER — Ambulatory Visit: Payer: Self-pay | Admitting: Cardiothoracic Surgery

## 2011-08-01 ENCOUNTER — Encounter: Payer: Self-pay | Admitting: *Deleted

## 2012-02-08 ENCOUNTER — Telehealth: Payer: Self-pay | Admitting: Family Medicine

## 2012-02-08 ENCOUNTER — Other Ambulatory Visit: Payer: Self-pay | Admitting: Family Medicine

## 2012-02-08 MED ORDER — LISINOPRIL-HYDROCHLOROTHIAZIDE 20-12.5 MG PO TABS: 1.0000 | ORAL_TABLET | Freq: Every day | ORAL | Status: DC

## 2012-02-08 NOTE — Telephone Encounter (Signed)
Pt needs refill on lisinopril-hctz # 30 call into [facility] [place] [phone]

## 2012-02-19 ENCOUNTER — Other Ambulatory Visit (INDEPENDENT_AMBULATORY_CARE_PROVIDER_SITE_OTHER): Payer: Self-pay

## 2012-02-19 DIAGNOSIS — Z0000 Encounter for general adult medical examination without abnormal findings: Secondary | ICD-10-CM

## 2012-02-19 LAB — CBC WITH DIFFERENTIAL/PLATELET
Basophils Absolute: 0 10*3/uL (ref 0.0–0.1)
Basophils Relative: 0.5 % (ref 0.0–3.0)
Eosinophils Absolute: 0.1 10*3/uL (ref 0.0–0.7)
Eosinophils Relative: 1.7 % (ref 0.0–5.0)
HCT: 40.2 % (ref 39.0–52.0)
Hemoglobin: 13.5 g/dL (ref 13.0–17.0)
Lymphocytes Relative: 33.7 % (ref 12.0–46.0)
Lymphs Abs: 2.1 10*3/uL (ref 0.7–4.0)
MCHC: 33.6 g/dL (ref 30.0–36.0)
MCV: 93.6 fl (ref 78.0–100.0)
Monocytes Absolute: 0.6 10*3/uL (ref 0.1–1.0)
Monocytes Relative: 10.2 % (ref 3.0–12.0)
Neutro Abs: 3.3 10*3/uL (ref 1.4–7.7)
Neutrophils Relative %: 53.9 % (ref 43.0–77.0)
Platelets: 214 10*3/uL (ref 150.0–400.0)
RBC: 4.3 Mil/uL (ref 4.22–5.81)
RDW: 12.9 % (ref 11.5–14.6)
WBC: 6.1 10*3/uL (ref 4.5–10.5)

## 2012-02-19 LAB — POCT URINALYSIS DIPSTICK
Blood, UA: NEGATIVE
Glucose, UA: NEGATIVE
Leukocytes, UA: NEGATIVE
Nitrite, UA: NEGATIVE
Protein, UA: NEGATIVE
Spec Grav, UA: >=1.03
Urobilinogen, UA: 0.2
pH, UA: 5.5

## 2012-02-19 LAB — HEPATIC FUNCTION PANEL
ALT: 28 U/L (ref 0–53)
AST: 24 U/L (ref 0–37)
Albumin: 3.8 g/dL (ref 3.5–5.2)
Alkaline Phosphatase: 37 U/L — ABNORMAL LOW (ref 39–117)
Bilirubin, Direct: 0.1 mg/dL (ref 0.0–0.3)
Total Bilirubin: 0.7 mg/dL (ref 0.3–1.2)
Total Protein: 6.5 g/dL (ref 6.0–8.3)

## 2012-02-19 LAB — LIPID PANEL
Cholesterol: 252 mg/dL — ABNORMAL HIGH (ref 0–200)
HDL: 50.6 mg/dL (ref >39.00)
Total CHOL/HDL Ratio: 5
Triglycerides: 198 mg/dL — ABNORMAL HIGH (ref 0.0–149.0)
VLDL: 39.6 mg/dL (ref 0.0–40.0)

## 2012-02-19 LAB — BASIC METABOLIC PANEL
BUN: 17 mg/dL (ref 6–23)
CO2: 29 mEq/L (ref 19–32)
Calcium: 9.1 mg/dL (ref 8.4–10.5)
Chloride: 102 mEq/L (ref 96–112)
Creatinine, Ser: 1.1 mg/dL (ref 0.4–1.5)
GFR: 72.51 mL/min (ref >60.00)
Glucose, Bld: 88 mg/dL (ref 70–99)
Potassium: 4.4 mEq/L (ref 3.5–5.1)
Sodium: 138 mEq/L (ref 135–145)

## 2012-02-19 LAB — LDL CHOLESTEROL, DIRECT: Direct LDL: 162.2 mg/dL

## 2012-02-19 LAB — PSA: PSA: 0.33 ng/mL (ref 0.10–4.00)

## 2012-02-19 LAB — TSH: TSH: 3.38 u[IU]/mL (ref 0.35–5.50)

## 2012-02-25 ENCOUNTER — Encounter: Payer: Self-pay | Admitting: Family Medicine

## 2012-02-25 ENCOUNTER — Ambulatory Visit (INDEPENDENT_AMBULATORY_CARE_PROVIDER_SITE_OTHER): Payer: Self-pay | Admitting: Family Medicine

## 2012-02-25 VITALS — BP 180/80 | Temp 98.3°F | Ht 68.0 in | Wt 260.0 lb

## 2012-02-25 DIAGNOSIS — I351 Nonrheumatic aortic (valve) insufficiency: Secondary | ICD-10-CM

## 2012-02-25 DIAGNOSIS — E785 Hyperlipidemia, unspecified: Secondary | ICD-10-CM

## 2012-02-25 DIAGNOSIS — Z1211 Encounter for screening for malignant neoplasm of colon: Secondary | ICD-10-CM

## 2012-02-25 DIAGNOSIS — I359 Nonrheumatic aortic valve disorder, unspecified: Secondary | ICD-10-CM

## 2012-02-25 DIAGNOSIS — Z23 Encounter for immunization: Secondary | ICD-10-CM

## 2012-02-25 DIAGNOSIS — I10 Essential (primary) hypertension: Secondary | ICD-10-CM

## 2012-02-25 MED ORDER — AMLODIPINE BESYLATE 5 MG PO TABS: 5.0000 mg | ORAL_TABLET | Freq: Every day | ORAL | Status: DC

## 2012-02-25 MED ORDER — LISINOPRIL-HYDROCHLOROTHIAZIDE 20-12.5 MG PO TABS: 1.0000 | ORAL_TABLET | Freq: Every day | ORAL | Status: DC

## 2012-02-25 NOTE — Progress Notes (Signed)
  Subjective:    Patient ID: Gregory [name], male    DOB: 05/01/1950, 62 y.o.   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 Rexene Edison. In cardiology in June exam was unchanged EKG was unchanged. He has voltage criteria for LVH secondary to his aortic insufficiency however he's noticed no change in exercise tolerance PND peripheral edema etc. He states he's able to work 8 hours outside doing [employer] representative without complications  His blood pressure today is 180/80. He states his cuff at home he gets 138-165 systolic over 60-65 diastolic. However his cuff is over 40 years old. I will ask him to purchase a new cuff.  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   HENT: Negative.   Eyes: Negative.   Respiratory: Negative.   Cardiovascular: Negative.   Gastrointestinal: Negative.   Genitourinary: Negative.   Musculoskeletal: Negative.   Skin: Negative.   Neurological: Negative.   Psychiatric/Behavioral: Negative.        Objective:   Physical Exam  Constitutional: He is oriented to person, place, and time. He appears well-developed and well-nourished.  HENT:  Head: Normocephalic and atraumatic.  Right Ear: External ear normal.  Left Ear: External ear normal.  Nose: Nose normal.  Mouth/Throat: Oropharynx is clear and moist.  Eyes: Conjunctivae and EOM are normal. Pupils are equal, round, and reactive to light.  Neck: Normal range of motion. Neck supple. No JVD present. No tracheal deviation present. No thyromegaly present.  Cardiovascular: Normal rate, regular rhythm, normal heart sounds and intact  distal pulses.  Exam reveals no gallop and no friction rub.   No murmur heard. Grade 2 murmur of aortic insufficiency also audible in the carotids  Aorta normal peripheral pulses normal 1+ peripheral edema bilaterally  Pulmonary/Chest: Effort normal and breath sounds normal. No stridor. No respiratory distress. He has no wheezes. He has no rales. He exhibits no tenderness.  Abdominal: Soft. Bowel sounds are normal. He exhibits no distension and no mass. There is no tenderness. There is no rebound and no guarding.  Genitourinary: Rectum normal, prostate normal and penis normal. Guaiac negative stool. No penile tenderness.  Musculoskeletal: Normal range of motion. He exhibits no edema and no tenderness.  Lymphadenopathy:    He has no cervical adenopathy.  Neurological: He is alert and oriented to person, place, and time. He has normal reflexes. No cranial nerve deficit. He exhibits normal muscle tone.  Skin: Skin is warm and dry. No rash noted. No erythema. No pallor.  Psychiatric: He has a normal mood and affect. His behavior is normal. Judgment and thought content normal.          Assessment & Plan:  Healthy male  Overweight again encouraged diet exercise and weight loss  Hypertension purchased a new blood pressure cuff monitor Bp,,,,,,,,,, return in 6 weeks for followup  Continue the Norvasc and lisinopril for good blood pressure control and an aspirin tablet  We will put you in the 2 for a screening colonoscopy

## 2012-02-25 NOTE — Patient Instructions (Addendum)
Continue your good health habits  Of avoid salt  Purchase a new digital blood pressure cuff  Omron and check your blood pressure daily in the morning  Return in 6 weeks for followup  Annual physical examination  Fat-free diet as we discussed and return in 3 months for followup lipid panel,,,,,,,,,, fasting

## 2012-04-08 ENCOUNTER — Ambulatory Visit (INDEPENDENT_AMBULATORY_CARE_PROVIDER_SITE_OTHER): Payer: Self-pay | Admitting: Family Medicine

## 2012-04-08 ENCOUNTER — Encounter: Payer: Self-pay | Admitting: Family Medicine

## 2012-04-08 VITALS — BP 150/70 | Temp 98.4°F | Wt 260.0 lb

## 2012-04-08 DIAGNOSIS — I10 Essential (primary) hypertension: Secondary | ICD-10-CM

## 2012-04-08 MED ORDER — AMLODIPINE BESYLATE 10 MG PO TABS: 10.0000 mg | ORAL_TABLET | Freq: Every day | ORAL | Status: DC

## 2012-04-08 NOTE — Progress Notes (Signed)
  Subjective:    Patient ID: Gregory [name], male    DOB: [birth year], 62 y.o.   MRN: [id number]  HPI  Gregory [name] is a delightful 62 year old married male nonsmoker who comes in today for followup of hypertension  He's currently on Norvasc 5 mg daily and systematic 20-12.5 daily BP 150/70. Pulse 70 and regular. He does have a history of aortic insufficiency  Review of Systems    review of systems negative Objective:   Physical Exam Well-developed well-nourished male in no acute distress BP right arm sitting position with cough and his digital cuff 160/70       Assessment & Plan:  Hypertension not at goal increase

## 2012-04-08 NOTE — Patient Instructions (Addendum)
Continue the Zestoretic one daily  Increase the Norvasc to 10 mg daily  Check a blood pressure daily in the morning  Return in one month for followup  Make an appointment to see Dr. Davonna Belling this spring for followup

## 2012-05-20 ENCOUNTER — Other Ambulatory Visit (INDEPENDENT_AMBULATORY_CARE_PROVIDER_SITE_OTHER): Payer: Self-pay

## 2012-05-20 DIAGNOSIS — E785 Hyperlipidemia, unspecified: Secondary | ICD-10-CM

## 2012-05-20 LAB — LIPID PANEL
Cholesterol: 234 mg/dL — ABNORMAL HIGH (ref 0–200)
HDL: 46.8 mg/dL (ref >39.00)
Total CHOL/HDL Ratio: 5
Triglycerides: 146 mg/dL (ref 0.0–149.0)
VLDL: 29.2 mg/dL (ref 0.0–40.0)

## 2012-05-20 LAB — LDL CHOLESTEROL, DIRECT: Direct LDL: 153.3 mg/dL

## 2012-05-27 ENCOUNTER — Ambulatory Visit: Payer: Self-pay | Admitting: Family Medicine

## 2012-05-29 ENCOUNTER — Encounter: Payer: Self-pay | Admitting: Family Medicine

## 2012-05-29 ENCOUNTER — Ambulatory Visit (INDEPENDENT_AMBULATORY_CARE_PROVIDER_SITE_OTHER): Payer: Self-pay | Admitting: Family Medicine

## 2012-05-29 VITALS — BP 140/70 | Temp 98.2°F | Wt 265.0 lb

## 2012-05-29 DIAGNOSIS — I10 Essential (primary) hypertension: Secondary | ICD-10-CM

## 2012-05-29 LAB — BASIC METABOLIC PANEL
BUN: 18 mg/dL (ref 6–23)
CO2: 28 mEq/L (ref 19–32)
Calcium: 8.6 mg/dL (ref 8.4–10.5)
Chloride: 103 mEq/L (ref 96–112)
Creatinine, Ser: 1 mg/dL (ref 0.4–1.5)
GFR: 80.95 mL/min (ref >60.00)
Glucose, Bld: 88 mg/dL (ref 70–99)
Potassium: 4.3 mEq/L (ref 3.5–5.1)
Sodium: 138 mEq/L (ref 135–145)

## 2012-05-29 MED ORDER — LISINOPRIL 40 MG PO TABS: 40.0000 mg | ORAL_TABLET | Freq: Every day | ORAL | Status: DC

## 2012-05-29 MED ORDER — HYDROCHLOROTHIAZIDE 25 MG PO TABS: 25.0000 mg | ORAL_TABLET | Freq: Every day | ORAL | Status: DC

## 2012-05-29 NOTE — Progress Notes (Signed)
  Subjective:    Patient ID: Gregory [name], male    DOB: 17-Jan-1950, 62 y.o.   MRN: [id number]  HPI Gregory [name] is a Delightful 62 year old married male nonsmoker who comes in today for followup of hypertension. We increased his Norvasc to 10 mg daily baseline a systematic 20-12.5. BP still elevated 170/70 today. BP checks at home consistent We also did a followup lipid panel in 2 weeks ago. He changed his diet decreased his fatty intake and alcohol and lipids are fairly back to normal  Review of Systems    review of systems negative,,,,,,,,, daughter recently got a job at [employer] in the nursing department Objective:   Physical Exam  Well-developed well-nourished man any acute distress BP right arm sitting position 170/70      Assessment & Plan:  Hypertension not at goal increase lisinopril to 40 mg daily BP check daily followup in

## 2012-05-29 NOTE — Patient Instructions (Addendum)
Continue the Norvasc 10 mg daily  Stop the Zestoretic 20-12.5  Start lisinopril 40 mg and hydrochlorothiazide 25 mg,,,,,,,,,,,, one of each every morning  BP check every morning followup in one month

## 2013-04-17 ENCOUNTER — Telehealth: Payer: Self-pay | Admitting: Family Medicine

## 2013-04-17 DIAGNOSIS — I10 Essential (primary) hypertension: Secondary | ICD-10-CM

## 2013-04-17 MED ORDER — AMLODIPINE BESYLATE 10 MG PO TABS: 10.0000 mg | ORAL_TABLET | Freq: Every day | ORAL | Status: DC

## 2013-04-17 NOTE — Telephone Encounter (Signed)
Rx sent to pharmacy   

## 2013-04-17 NOTE — Telephone Encounter (Signed)
Patient came in and schedule his CPX for 05/26/13, but also needs a refill of amLODipine (NORVASC) 10 MG tablet. He actually needs refill before his next appointment date. His contact number has been verified. Thanks!

## 2013-05-18 ENCOUNTER — Other Ambulatory Visit (INDEPENDENT_AMBULATORY_CARE_PROVIDER_SITE_OTHER): Payer: Self-pay

## 2013-05-18 DIAGNOSIS — Z0000 Encounter for general adult medical examination without abnormal findings: Secondary | ICD-10-CM

## 2013-05-18 LAB — LIPID PANEL
Cholesterol: 236 mg/dL — ABNORMAL HIGH (ref 0–200)
HDL: 49.3 mg/dL (ref >39.00)
LDL Cholesterol: 160 mg/dL — ABNORMAL HIGH (ref 0–99)
Total CHOL/HDL Ratio: 5
Triglycerides: 134 mg/dL (ref 0.0–149.0)
VLDL: 26.8 mg/dL (ref 0.0–40.0)

## 2013-05-18 LAB — CBC WITH DIFFERENTIAL/PLATELET
Basophils Absolute: 0 10*3/uL (ref 0.0–0.1)
Basophils Relative: 0.5 % (ref 0.0–3.0)
Eosinophils Absolute: 0.2 10*3/uL (ref 0.0–0.7)
Eosinophils Relative: 2.7 % (ref 0.0–5.0)
HCT: 38.7 % — ABNORMAL LOW (ref 39.0–52.0)
Hemoglobin: 12.7 g/dL — ABNORMAL LOW (ref 13.0–17.0)
Lymphocytes Relative: 30 % (ref 12.0–46.0)
Lymphs Abs: 2 10*3/uL (ref 0.7–4.0)
MCHC: 32.8 g/dL (ref 30.0–36.0)
MCV: 95.9 fl (ref 78.0–100.0)
Monocytes Absolute: 0.6 10*3/uL (ref 0.1–1.0)
Monocytes Relative: 9.4 % (ref 3.0–12.0)
Neutro Abs: 3.9 10*3/uL (ref 1.4–7.7)
Neutrophils Relative %: 57.4 % (ref 43.0–77.0)
Platelets: 216 10*3/uL (ref 150.0–400.0)
RBC: 4.03 Mil/uL — ABNORMAL LOW (ref 4.22–5.81)
RDW: 13.9 % (ref 11.5–15.5)
WBC: 6.8 10*3/uL (ref 4.0–10.5)

## 2013-05-18 LAB — BASIC METABOLIC PANEL
BUN: 31 mg/dL — ABNORMAL HIGH (ref 6–23)
CO2: 25 mEq/L (ref 19–32)
Calcium: 8.8 mg/dL (ref 8.4–10.5)
Chloride: 104 mEq/L (ref 96–112)
Creatinine, Ser: 1.2 mg/dL (ref 0.4–1.5)
GFR: 62.24 mL/min (ref >60.00)
Glucose, Bld: 101 mg/dL — ABNORMAL HIGH (ref 70–99)
Potassium: 4.7 mEq/L (ref 3.5–5.1)
Sodium: 137 mEq/L (ref 135–145)

## 2013-05-18 LAB — HEPATIC FUNCTION PANEL
ALT: 26 U/L (ref 0–53)
AST: 24 U/L (ref 0–37)
Albumin: 3.8 g/dL (ref 3.5–5.2)
Alkaline Phosphatase: 39 U/L (ref 39–117)
Bilirubin, Direct: 0.1 mg/dL (ref 0.0–0.3)
Total Bilirubin: 0.7 mg/dL (ref 0.2–1.2)
Total Protein: 6.2 g/dL (ref 6.0–8.3)

## 2013-05-18 LAB — POCT URINALYSIS DIPSTICK
Blood, UA: NEGATIVE
Glucose, UA: NEGATIVE
Leukocytes, UA: NEGATIVE
Nitrite, UA: NEGATIVE
Spec Grav, UA: >=1.03
Urobilinogen, UA: 0.2
pH, UA: 5.5

## 2013-05-18 LAB — PSA: PSA: 0.36 ng/mL (ref 0.10–4.00)

## 2013-05-18 LAB — TSH: TSH: 2.99 u[IU]/mL (ref 0.35–4.50)

## 2013-05-21 ENCOUNTER — Telehealth: Payer: Self-pay | Admitting: Family Medicine

## 2013-05-21 DIAGNOSIS — I10 Essential (primary) hypertension: Secondary | ICD-10-CM

## 2013-05-21 MED ORDER — HYDROCHLOROTHIAZIDE 25 MG PO TABS: 25.0000 mg | ORAL_TABLET | Freq: Every day | ORAL | Status: DC

## 2013-05-21 MED ORDER — LISINOPRIL 40 MG PO TABS: 40.0000 mg | ORAL_TABLET | Freq: Every day | ORAL | Status: DC

## 2013-05-21 NOTE — Telephone Encounter (Signed)
[facility] [name], Gregory [name] is requesting re-fill on lisinopril (PRINIVIL,ZESTRIL) 40 MG tablet and lisinopril-hydrochlorothiazide (PRINZIDE,ZESTORETIC) 20-12.5 MG per tablet

## 2013-05-22 ENCOUNTER — Telehealth: Payer: Self-pay | Admitting: Family Medicine

## 2013-05-22 NOTE — Telephone Encounter (Signed)
Relevant patient education mailed to patient.q

## 2013-05-26 ENCOUNTER — Ambulatory Visit (INDEPENDENT_AMBULATORY_CARE_PROVIDER_SITE_OTHER): Payer: Self-pay | Admitting: Family Medicine

## 2013-05-26 ENCOUNTER — Encounter: Payer: Self-pay | Admitting: Family Medicine

## 2013-05-26 VITALS — BP 130/90 | Temp 98.3°F | Ht 68.0 in | Wt 263.0 lb

## 2013-05-26 DIAGNOSIS — D649 Anemia, unspecified: Secondary | ICD-10-CM

## 2013-05-26 DIAGNOSIS — I10 Essential (primary) hypertension: Secondary | ICD-10-CM

## 2013-05-26 DIAGNOSIS — I359 Nonrheumatic aortic valve disorder, unspecified: Secondary | ICD-10-CM

## 2013-05-26 DIAGNOSIS — E669 Obesity, unspecified: Secondary | ICD-10-CM

## 2013-05-26 DIAGNOSIS — R011 Cardiac murmur, unspecified: Secondary | ICD-10-CM

## 2013-05-26 DIAGNOSIS — I351 Nonrheumatic aortic (valve) insufficiency: Secondary | ICD-10-CM

## 2013-05-26 MED ORDER — LISINOPRIL 40 MG PO TABS: 40.0000 mg | ORAL_TABLET | Freq: Every day | ORAL | Status: DC

## 2013-05-26 MED ORDER — AMLODIPINE BESYLATE 10 MG PO TABS: 10.0000 mg | ORAL_TABLET | Freq: Every day | ORAL | Status: DC

## 2013-05-26 MED ORDER — HYDROCHLOROTHIAZIDE 25 MG PO TABS: 25.0000 mg | ORAL_TABLET | Freq: Every day | ORAL | Status: DC

## 2013-05-26 NOTE — Progress Notes (Signed)
Pre visit review using our clinic review tool, if applicable. No additional management support is needed unless otherwise documented below in the visit note. 

## 2013-05-26 NOTE — Progress Notes (Signed)
   Subjective:    Patient ID: Gregory [name], male    DOB: [birth year], 63 y.o.   MRN: [id number]  HPI Gregory [name] is a 63 year old married male nonsmoker who comes in today for general physical examination because of a history of hypertension, obesity...Marland KitchenMarland KitchenMarland Kitchen weight 263 pounds....... aortic insufficiency  He says overall he feels well and has no complaints.  He gets routine eye care, dental care, never had a colonoscopy. I've explained to him repeatedly the importance of screening colonoscopy again he declines  Vaccinations up-to-date  His GFR has gone from 80-62 BUN is up to 31 from 18 and he's noticed decreased exercise tolerance which may mean his aortic insufficiency is getting worse   Review of Systems  Constitutional: Negative.   HENT: Negative.   Eyes: Negative.   Respiratory: Negative.   Cardiovascular: Negative.   Gastrointestinal: Negative.   Genitourinary: Negative.   Musculoskeletal: Negative.   Skin: Negative.   Neurological: Negative.   Psychiatric/Behavioral: Negative.        Objective:   Physical Exam  Nursing note and vitals reviewed. Constitutional: He is oriented to person, place, and time. He appears well-developed and well-nourished.  HENT:  Head: Normocephalic and atraumatic.  Right Ear: External ear normal.  Left Ear: External ear normal.  Nose: Nose normal.  Mouth/Throat: Oropharynx is clear and moist.  Eyes: Conjunctivae and EOM are normal. Pupils are equal, round, and reactive to light.  Neck: Normal range of motion. Neck supple. No JVD present. No tracheal deviation present. No thyromegaly present.  Cardiovascular: Normal rate, regular rhythm and intact distal pulses.  Exam reveals no gallop and no friction rub.   Murmur heard. Pulmonary/Chest: Effort normal and breath sounds normal. No stridor. No respiratory distress. He has no wheezes. He has no rales. He exhibits no tenderness.  Abdominal: Soft. Bowel sounds are normal. He exhibits no distension  and no mass. There is no tenderness. There is no rebound and no guarding.  Genitourinary: Rectum normal, prostate normal and penis normal. Guaiac negative stool. No penile tenderness.  Musculoskeletal: Normal range of motion. He exhibits no edema and no tenderness.  Lymphadenopathy:    He has no cervical adenopathy.  Neurological: He is alert and oriented to person, place, and time. He has normal reflexes. No cranial nerve deficit. He exhibits normal muscle tone.  Skin: Skin is warm and dry. No rash noted. No erythema. No pallor.  Psychiatric: He has a normal mood and affect. His behavior is normal. Judgment and thought content normal.          Assessment & Plan:  ... Obesity......... diet exercise and weight loss  Hypertension at goal continue current therapy  Aortic insufficiency followup by cardiology yearly ..............[name]  Anemia....... stop aspirin...Marland KitchenMarland KitchenMarland Kitchen begin iron........ colonoscopy .........[name]

## 2013-05-26 NOTE — Patient Instructions (Signed)
Work hard this summer with walking 30 minutes daily...Marland KitchenMarland KitchenMarland Kitchen diet and weight loss  Call and make an appointment to see Dr. Deeann Dowse. ............ in cardiology  Continue your current medications  Followup in 1 year sooner if any problem ..........[name]

## 2013-06-10 ENCOUNTER — Encounter: Payer: Self-pay | Admitting: Family Medicine

## 2013-08-03 ENCOUNTER — Ambulatory Visit: Payer: Self-pay | Admitting: Family Medicine

## 2013-08-06 ENCOUNTER — Ambulatory Visit (INDEPENDENT_AMBULATORY_CARE_PROVIDER_SITE_OTHER): Payer: Medicare Other | Admitting: Family Medicine

## 2013-08-06 ENCOUNTER — Encounter: Payer: Self-pay | Admitting: Family Medicine

## 2013-08-06 VITALS — BP 110/70 | Temp 98.5°F | Wt 261.0 lb

## 2013-08-06 DIAGNOSIS — D509 Iron deficiency anemia, unspecified: Secondary | ICD-10-CM | POA: Diagnosis not present

## 2013-08-06 DIAGNOSIS — I10 Essential (primary) hypertension: Secondary | ICD-10-CM

## 2013-08-06 DIAGNOSIS — D649 Anemia, unspecified: Secondary | ICD-10-CM | POA: Insufficient documentation

## 2013-08-06 DIAGNOSIS — D539 Nutritional anemia, unspecified: Secondary | ICD-10-CM | POA: Insufficient documentation

## 2013-08-06 LAB — CBC WITH DIFFERENTIAL/PLATELET
Basophils Absolute: 0 10*3/uL (ref 0.0–0.1)
Basophils Relative: 0.3 % (ref 0.0–3.0)
Eosinophils Absolute: 0.2 10*3/uL (ref 0.0–0.7)
Eosinophils Relative: 2 % (ref 0.0–5.0)
HCT: 39.1 % (ref 39.0–52.0)
Hemoglobin: 13.2 g/dL (ref 13.0–17.0)
Lymphocytes Relative: 26.7 % (ref 12.0–46.0)
Lymphs Abs: 2.2 10*3/uL (ref 0.7–4.0)
MCHC: 33.6 g/dL (ref 30.0–36.0)
MCV: 95.1 fl (ref 78.0–100.0)
Monocytes Absolute: 0.7 10*3/uL (ref 0.1–1.0)
Monocytes Relative: 8.2 % (ref 3.0–12.0)
Neutro Abs: 5.2 10*3/uL (ref 1.4–7.7)
Neutrophils Relative %: 62.8 % (ref 43.0–77.0)
Platelets: 227 10*3/uL (ref 150.0–400.0)
RBC: 4.11 Mil/uL — ABNORMAL LOW (ref 4.22–5.81)
RDW: 13.6 % (ref 11.5–15.5)
WBC: 8.3 10*3/uL (ref 4.0–10.5)

## 2013-08-06 NOTE — Progress Notes (Signed)
   Subjective:    Patient ID: Gregory [name], male    DOB: 1950/11/10, 63 y.o.   MRN: [id number]  HPI Gregory [name] is a 63 year old married male nonsmoker who comes in today for evaluation of 2 problems  We changed his blood pressure medication because he was not at goal. On 10 mg of Norvasc, hydrochlorothiazide 25 and lisinopril 40 mg daily his blood pressures dropped to 110/70. No side effects to medication  On his physical exam 2 months ago we also notice a slight anemia. He didn't take an aspirin daily. Otherwise his CBC was normal. We therefore stopped the aspirin and start him on iron. He needs a followup CBC to be sure his blood count has gone back to normal   Review of Systems Negative    Objective:   Physical Exam  Well-developed and nourished male no acute distress vital signs stable he is afebrile BP 110/70      Assessment & Plan:  Hypertension at goal,,,,,,,,,,,,,, continue current therapy  Anemia,,,,,,,,,,, probably secondary to aspirin,,,,,,,,,, recheck CBC,

## 2013-08-06 NOTE — Progress Notes (Signed)
Pre visit review using our clinic review tool, if applicable. No additional management support is needed unless otherwise documented below in the visit note. 

## 2013-08-06 NOTE — Patient Instructions (Signed)
Continue current medications  I will call you I get your blood count report back

## 2013-08-12 NOTE — Progress Notes (Signed)
I left a message at the pts cell number to return my call-home number disconnected.

## 2014-05-03 ENCOUNTER — Telehealth: Payer: Self-pay

## 2014-05-03 DIAGNOSIS — I10 Essential (primary) hypertension: Secondary | ICD-10-CM

## 2014-05-03 MED ORDER — LISINOPRIL 40 MG PO TABS: 40.0000 mg | ORAL_TABLET | Freq: Every day | ORAL | Status: DC

## 2014-05-03 MED ORDER — HYDROCHLOROTHIAZIDE 25 MG PO TABS: 25.0000 mg | ORAL_TABLET | Freq: Every day | ORAL | Status: DC

## 2014-05-03 NOTE — Telephone Encounter (Signed)
rx sent

## 2014-05-03 NOTE — Telephone Encounter (Signed)
[facility] refill request for lisinopril (PRINIVIL,ZESTRIL) 40 MG tablet and hydrochlorothiazide (HYDRODIURIL) 25 MG tablet

## 2014-08-04 ENCOUNTER — Other Ambulatory Visit: Payer: Self-pay | Admitting: Family Medicine

## 2014-08-18 ENCOUNTER — Telehealth: Payer: Self-pay | Admitting: Family Medicine

## 2014-08-18 DIAGNOSIS — I10 Essential (primary) hypertension: Secondary | ICD-10-CM

## 2014-08-18 MED ORDER — LISINOPRIL 40 MG PO TABS: 40.0000 mg | ORAL_TABLET | Freq: Every day | ORAL | Status: DC

## 2014-08-18 MED ORDER — AMLODIPINE BESYLATE 10 MG PO TABS: 10.0000 mg | ORAL_TABLET | Freq: Every day | ORAL | Status: DC

## 2014-08-18 MED ORDER — HYDROCHLOROTHIAZIDE 25 MG PO TABS: 25.0000 mg | ORAL_TABLET | Freq: Every day | ORAL | Status: DC

## 2014-08-18 NOTE — Telephone Encounter (Signed)
Pt has an appt for cpx in jan 2017. Pt needs refills on lisinopril, hctz and amlodipine sent to [facility] in [place]

## 2015-01-11 ENCOUNTER — Ambulatory Visit (INDEPENDENT_AMBULATORY_CARE_PROVIDER_SITE_OTHER): Payer: Self-pay | Admitting: Family Medicine

## 2015-01-11 ENCOUNTER — Encounter: Payer: Self-pay | Admitting: Family Medicine

## 2015-01-11 VITALS — BP 130/78 | Temp 97.7°F | Ht 68.0 in | Wt 269.0 lb

## 2015-01-11 DIAGNOSIS — E669 Obesity, unspecified: Secondary | ICD-10-CM

## 2015-01-11 DIAGNOSIS — R011 Cardiac murmur, unspecified: Secondary | ICD-10-CM

## 2015-01-11 DIAGNOSIS — I351 Nonrheumatic aortic (valve) insufficiency: Secondary | ICD-10-CM

## 2015-01-11 DIAGNOSIS — Z23 Encounter for immunization: Secondary | ICD-10-CM

## 2015-01-11 DIAGNOSIS — R351 Nocturia: Secondary | ICD-10-CM

## 2015-01-11 DIAGNOSIS — N401 Enlarged prostate with lower urinary tract symptoms: Secondary | ICD-10-CM

## 2015-01-11 DIAGNOSIS — I10 Essential (primary) hypertension: Secondary | ICD-10-CM

## 2015-01-11 DIAGNOSIS — Z0000 Encounter for general adult medical examination without abnormal findings: Secondary | ICD-10-CM

## 2015-01-11 LAB — CBC WITH DIFFERENTIAL/PLATELET
Basophils Absolute: 0 10*3/uL (ref 0.0–0.1)
Basophils Relative: 0.4 % (ref 0.0–3.0)
Eosinophils Absolute: 0.2 10*3/uL (ref 0.0–0.7)
Eosinophils Relative: 2.4 % (ref 0.0–5.0)
HCT: 41.9 % (ref 39.0–52.0)
Hemoglobin: 13.8 g/dL (ref 13.0–17.0)
Lymphocytes Relative: 33.9 % (ref 12.0–46.0)
Lymphs Abs: 2.3 10*3/uL (ref 0.7–4.0)
MCHC: 33 g/dL (ref 30.0–36.0)
MCV: 94.8 fl (ref 78.0–100.0)
Monocytes Absolute: 0.7 10*3/uL (ref 0.1–1.0)
Monocytes Relative: 10.1 % (ref 3.0–12.0)
Neutro Abs: 3.7 10*3/uL (ref 1.4–7.7)
Neutrophils Relative %: 53.2 % (ref 43.0–77.0)
Platelets: 240 10*3/uL (ref 150.0–400.0)
RBC: 4.42 Mil/uL (ref 4.22–5.81)
RDW: 13.6 % (ref 11.5–15.5)
WBC: 6.9 10*3/uL (ref 4.0–10.5)

## 2015-01-11 LAB — POCT URINALYSIS DIPSTICK
Glucose, UA: NEGATIVE
Ketones, UA: NEGATIVE
Leukocytes, UA: NEGATIVE
Nitrite, UA: NEGATIVE
Protein, UA: NEGATIVE
Spec Grav, UA: >=1.03
Urobilinogen, UA: 0.2
pH, UA: 5.5

## 2015-01-11 LAB — BASIC METABOLIC PANEL
BUN: 19 mg/dL (ref 6–23)
CO2: 31 mEq/L (ref 19–32)
Calcium: 9.4 mg/dL (ref 8.4–10.5)
Chloride: 101 mEq/L (ref 96–112)
Creatinine, Ser: 0.99 mg/dL (ref 0.40–1.50)
GFR: 80.29 mL/min (ref >60.00)
Glucose, Bld: 87 mg/dL (ref 70–99)
Potassium: 4.6 mEq/L (ref 3.5–5.1)
Sodium: 140 mEq/L (ref 135–145)

## 2015-01-11 LAB — HEPATIC FUNCTION PANEL
ALT: 19 U/L (ref 0–53)
AST: 16 U/L (ref 0–37)
Albumin: 4.3 g/dL (ref 3.5–5.2)
Alkaline Phosphatase: 41 U/L (ref 39–117)
Bilirubin, Direct: 0.1 mg/dL (ref 0.0–0.3)
Total Bilirubin: 0.6 mg/dL (ref 0.2–1.2)
Total Protein: 6.6 g/dL (ref 6.0–8.3)

## 2015-01-11 LAB — LIPID PANEL
Cholesterol: 267 mg/dL — ABNORMAL HIGH (ref 0–200)
HDL: 52.6 mg/dL (ref >39.00)
LDL Cholesterol: 174 mg/dL — ABNORMAL HIGH (ref 0–99)
NonHDL: 213.95
Total CHOL/HDL Ratio: 5
Triglycerides: 198 mg/dL — ABNORMAL HIGH (ref 0.0–149.0)
VLDL: 39.6 mg/dL (ref 0.0–40.0)

## 2015-01-11 LAB — TSH: TSH: 4.14 u[IU]/mL (ref 0.35–4.50)

## 2015-01-11 LAB — PSA: PSA: 0.39 ng/mL (ref 0.10–4.00)

## 2015-01-11 MED ORDER — AMLODIPINE BESYLATE 10 MG PO TABS: 10.0000 mg | ORAL_TABLET | Freq: Every day | ORAL | Status: DC

## 2015-01-11 MED ORDER — LISINOPRIL 40 MG PO TABS: 40.0000 mg | ORAL_TABLET | Freq: Every day | ORAL | Status: DC

## 2015-01-11 MED ORDER — HYDROCHLOROTHIAZIDE 25 MG PO TABS: 25.0000 mg | ORAL_TABLET | Freq: Every day | ORAL | Status: DC

## 2015-01-11 NOTE — Progress Notes (Signed)
Pre visit review using our clinic review tool, if applicable. No additional management support is needed unless otherwise documented below in the visit note. 

## 2015-01-11 NOTE — Patient Instructions (Addendum)
Work are this year in the diet exercise and weight loss................. complete Sugar free diet  Continue current medication  Return when the week for removal of the lesion on the back  We will set you up a time in GI to discuss noninvasive colon cancer screening Please call your insurance company and find out where you can get the shingles vaccine the cheapest

## 2015-01-11 NOTE — Progress Notes (Signed)
Subjective:    Patient ID: Gregory [name], male    DOB: November 14, 1950, 65 y.o.   MRN: [id number]  HPI Head is a 65 year old married male nonsmoker who comes in today for general physical examination  He has a history of hypertension on Norvasc 10 mg, hydrochlorothiazide 25 mg, and lisinopril 40 mg daily his blood pressures normal 130/78  He said overall he feels well. He has a lesion on the left upper back which we advised to return last year and get removed. The os was unable to come back. It's grown in size. Again advised to come back ASAP and have that removed.  He gets routine eye care, dental care, colonoscopy........ have discussed since age 59 having a colonoscopy for screening. He's always declined. Explained the reasons for colonoscopy this time is willing to listen to their presentation about noninvasive screening.  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 He's got a big job in [place] right now  Married nonsmoker  Vaccinations tetanus 2014 and had a repeat in 2016. He declines a flu shot. He was given a Pneumovax. Information given on shingles although he has had occasional shingles in the past. Still recommend vaccination  Weight is 261. This was a year ago now it's 269. Again discussed diet exercise and weight loss.  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 Advised to do so   Review of Systems  Constitutional: Negative.   HENT: Negative.   Eyes: Negative.   Respiratory: Negative.   Cardiovascular: Negative.   Gastrointestinal: Negative.   Endocrine: Negative.   Genitourinary: Negative.   Musculoskeletal: Negative.   Skin: Negative.   Allergic/Immunologic: Negative.   Neurological: Negative.   Hematological: Negative.   Psychiatric/Behavioral: Negative.        Objective:   Physical Exam  Constitutional: He is oriented to person, place, and time. He appears well-developed and well-nourished.  HENT:  Head: Normocephalic and atraumatic.  Right Ear: External ear normal.  Left Ear: External ear normal.  Nose: Nose normal.  Mouth/Throat: Oropharynx is clear and moist.  Eyes: Conjunctivae and EOM are normal. Pupils are equal, round, and reactive to light.  Neck: Normal range of motion. Neck supple. No JVD present. No tracheal deviation present. No thyromegaly present.  Cardiovascular: Normal rate, regular rhythm, normal heart sounds and intact distal pulses.  Exam reveals no gallop and no friction rub.   Murmur of aortic insufficiency as previously heard no change in quality of murmur  Pulmonary/Chest: Effort normal and breath sounds normal. No stridor. No respiratory distress. He has no wheezes. He has no rales. He exhibits no tenderness.  Abdominal: Soft. Bowel sounds are normal. He exhibits no distension and no mass. There is no tenderness. There is no rebound and no guarding.  Genitourinary: Rectum normal and penis normal. Guaiac negative stool. No penile tenderness.  2+ symmetrical nonnodular BPH  Musculoskeletal: Normal range of motion. He exhibits no edema or tenderness.  Lymphadenopathy:    He has no cervical adenopathy.  Neurological: He is alert and oriented to person, place, and time. He has normal reflexes. No cranial nerve deficit. He exhibits normal muscle tone.  Skin: Skin is warm and dry. No rash noted. No erythema. No pallor.  Total body skin exam normal except a 12 mm x 12 mm lesion left upper back. Will get  him a time next week for removal it's enlarged and has irregular margins  Psychiatric: He has a normal mood and affect. His behavior is normal. Judgment and thought content normal.  Nursing note and vitals reviewed.         Assessment & Plan:Obesity. obesity  Obesity.......... again recommended diet exercise and weight loss  Hypertension  at goal......... continue current therapy  Aortic insufficiency..........[name] murmur unchanged no further therapy at this time  Abnormal mole left upper back......[name] return for removal ASAP  Also referred for GI to discuss noninvasive colon cancer screening

## 2015-01-18 ENCOUNTER — Ambulatory Visit (INDEPENDENT_AMBULATORY_CARE_PROVIDER_SITE_OTHER): Payer: Self-pay | Admitting: Family Medicine

## 2015-01-18 ENCOUNTER — Encounter: Payer: Self-pay | Admitting: Family Medicine

## 2015-01-18 DIAGNOSIS — C44509 Unspecified malignant neoplasm of skin of other part of trunk: Secondary | ICD-10-CM | POA: Insufficient documentation

## 2015-01-18 DIAGNOSIS — C44519 Basal cell carcinoma of skin of other part of trunk: Secondary | ICD-10-CM | POA: Diagnosis not present

## 2015-01-18 DIAGNOSIS — C44599 Other specified malignant neoplasm of skin of other part of trunk: Secondary | ICD-10-CM

## 2015-01-18 NOTE — Addendum Note (Signed)
Addended by: Westley Hummer B on: 01/18/2015 04:37 PM   Modules accepted: Orders

## 2015-01-18 NOTE — Patient Instructions (Signed)
Remove the Band-Aid tomorrow and leave the area open to the air  Within 2 weeks Gregory [name] or I will call you the report

## 2015-01-18 NOTE — Progress Notes (Signed)
   Subjective:    Patient ID: Gregory [name], male    DOB: [birth year], 65 y.o.   MRN: [id number]  HPI Gregory [name] is a 65 year old married male nonsmoker who comes in today for move of the lesion on his back  We noticed this lesion a year ago and asked him back for removal and never came back. We saw last week for a physical and noticed that again and indeed it's gotten bigger. We encouraged him to come back for move when he did today  The lesion measures 15 by note 15 mm. It's raised red and appears to be a skin cancer.  After informed consent lesion was cleaned with alcohol anesthetized with 1% Xylocaine with epinephrine. It was excised with 3 mm margins and sent for pathologic analysis. The base was cauterized. Band-Aid was applied. He tolerated the procedure no complications   Review of Systems    review of systems otherwise negative Objective:   Physical Exam  Procedure see above      Assessment & Plan:  15 mm x 15 mm mass left upper back appears to be a skin cancer..........[name] removed as above........ path pending

## 2015-03-15 ENCOUNTER — Ambulatory Visit: Payer: Medicare Other | Admitting: Family Medicine

## 2015-04-04 ENCOUNTER — Ambulatory Visit (INDEPENDENT_AMBULATORY_CARE_PROVIDER_SITE_OTHER): Payer: Self-pay | Admitting: Family Medicine

## 2015-04-04 ENCOUNTER — Encounter: Payer: Self-pay | Admitting: Family Medicine

## 2015-04-04 VITALS — BP 120/70 | Temp 98.4°F | Wt 273.0 lb

## 2015-04-04 DIAGNOSIS — L910 Hypertrophic scar: Secondary | ICD-10-CM

## 2015-04-04 NOTE — Progress Notes (Signed)
   Subjective:    Patient ID: Gregory [name], male    DOB: March 10, 1950, 65 y.o.   MRN: [id number]  HPI Gregory [name] is a 64 year old married male nonsmoker who comes in today for follow-up  We removed the lesion in his left upper scapula 2 months ago. Pathologically it turned out to be a basal cell carcinoma. Margins were clean he comes in today for follow-up. He says everything is well but it's itchy and elevated and red   Review of Systems Review of systems negative    Objective:   Physical Exam  Well-developed well-nourished male no acute distress vital signs stable he is afebrile examination of back shows a keloid formation were removed the basal cell carcinoma      Assessment & Plan:  Keloid in the basal cell carcinoma.......[name] 1/2 mL of Depo-Medrol was injected into the keloid......[name] return when necessary

## 2015-04-04 NOTE — Patient Instructions (Signed)
Return when necessary 

## 2015-04-04 NOTE — Progress Notes (Signed)
Pre visit review using our clinic review tool, if applicable. No additional management support is needed unless otherwise documented below in the visit note. 

## 2015-05-04 ENCOUNTER — Ambulatory Visit: Payer: Medicare Other | Admitting: Family Medicine

## 2015-07-26 ENCOUNTER — Telehealth: Payer: Self-pay | Admitting: Family Medicine

## 2015-07-26 NOTE — Telephone Encounter (Signed)
Spouse called wanting to transfer care from dr todd to dr Diona Browner.  [place] is closer to home Ok to schedule

## 2015-07-26 NOTE — Telephone Encounter (Signed)
Okay as long as Dr Sherren Mocha does not have concerns about pt.

## 2015-08-10 NOTE — Telephone Encounter (Signed)
ok 

## 2015-08-10 NOTE — Telephone Encounter (Signed)
Left message asking pt to call office  °

## 2015-09-06 DIAGNOSIS — M170 Bilateral primary osteoarthritis of knee: Secondary | ICD-10-CM | POA: Diagnosis not present

## 2015-09-06 DIAGNOSIS — M25561 Pain in right knee: Secondary | ICD-10-CM | POA: Diagnosis not present

## 2015-09-06 DIAGNOSIS — M25562 Pain in left knee: Secondary | ICD-10-CM | POA: Diagnosis not present

## 2015-09-06 DIAGNOSIS — R262 Difficulty in walking, not elsewhere classified: Secondary | ICD-10-CM | POA: Diagnosis not present

## 2015-09-13 DIAGNOSIS — M25561 Pain in right knee: Secondary | ICD-10-CM | POA: Diagnosis not present

## 2015-09-13 DIAGNOSIS — M1711 Unilateral primary osteoarthritis, right knee: Secondary | ICD-10-CM | POA: Diagnosis not present

## 2015-09-14 DIAGNOSIS — M25562 Pain in left knee: Secondary | ICD-10-CM | POA: Diagnosis not present

## 2015-09-14 DIAGNOSIS — M1712 Unilateral primary osteoarthritis, left knee: Secondary | ICD-10-CM | POA: Diagnosis not present

## 2015-09-20 DIAGNOSIS — M170 Bilateral primary osteoarthritis of knee: Secondary | ICD-10-CM | POA: Diagnosis not present

## 2015-09-20 DIAGNOSIS — M25561 Pain in right knee: Secondary | ICD-10-CM | POA: Diagnosis not present

## 2015-09-20 DIAGNOSIS — M25562 Pain in left knee: Secondary | ICD-10-CM | POA: Diagnosis not present

## 2015-09-23 ENCOUNTER — Ambulatory Visit (INDEPENDENT_AMBULATORY_CARE_PROVIDER_SITE_OTHER): Payer: Medicare Other | Admitting: Family Medicine

## 2015-09-23 ENCOUNTER — Encounter: Payer: Self-pay | Admitting: Family Medicine

## 2015-09-23 DIAGNOSIS — D509 Iron deficiency anemia, unspecified: Secondary | ICD-10-CM

## 2015-09-23 DIAGNOSIS — I10 Essential (primary) hypertension: Secondary | ICD-10-CM | POA: Diagnosis not present

## 2015-09-23 DIAGNOSIS — M170 Bilateral primary osteoarthritis of knee: Secondary | ICD-10-CM

## 2015-09-23 DIAGNOSIS — I351 Nonrheumatic aortic (valve) insufficiency: Secondary | ICD-10-CM | POA: Diagnosis not present

## 2015-09-23 NOTE — Assessment & Plan Note (Signed)
Unclear reason present.  Stop iron. recehck with iron test in 01/2016 in next CPX.

## 2015-09-23 NOTE — Assessment & Plan Note (Signed)
Need to re-eval with ECHO. Some evidence of fluid overload and DOE.

## 2015-09-23 NOTE — Progress Notes (Signed)
Pre visit review using our clinic review tool, if applicable. No additional management support is needed unless otherwise documented below in the visit note. 

## 2015-09-23 NOTE — Assessment & Plan Note (Signed)
Well controlled. Continue current medication.  

## 2015-09-23 NOTE — Progress Notes (Addendum)
   Subjective:    Patient ID: Gregory [name], male    DOB: 09-30-1950, 65 y.o.   MRN: [id number]  HPI  65 year old male presents to establish care.  Previous PCP: Dr. Sherren Mocha  Last CPX: 01/2015 reviewed note.  Hypertension:   Stable control on amlodipine, HCTZ and lisinopril BP Readings from Last 3 Encounters:  09/23/15 128/70  04/04/15 120/70  01/11/15 130/78  Using medication without problems or lightheadedness:  Chest pain with exertion: None Edema: Has noted swelling in leg in last 4-5 months. Short of breath:some increase with exertio Average home BPs:  Not checking regularly. Other issues: Aortic insufficiency, moderate: ECHO: stable in 2013. Was seen by Dr. Percival Spanish at the time.  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 No additional exercise  Bilateral bone on bone knee arthritis: Followed by flexogenics. Stable control at this time.  Hx of anemia: on ferrous sulfate. HAs been using once a week.  Morbid obesity:  Body mass index is 43.38 kg/m.   Review of Systems  Constitutional: Negative for fatigue.  Respiratory: Positive for shortness of breath. Negative for cough.   Cardiovascular: Positive for leg swelling. Negative for chest pain and palpitations.  Gastrointestinal: Negative for abdominal pain.       Objective:   Physical Exam  Constitutional: Vital signs are normal. He appears well-developed and well-nourished.  Morbid obesity, central obesity  HENT:  Head: Normocephalic.  Right Ear: Hearing normal.  Left Ear: Hearing normal.  Nose: Nose normal.  Mouth/Throat: Oropharynx is clear and moist and mucous membranes are normal.  Neck: Trachea normal. Carotid bruit is not present. No thyroid mass and no thyromegaly present.  Cardiovascular: Normal rate, regular rhythm and normal pulses.  Exam reveals no gallop, no distant heart sounds and no friction rub.   Murmur heard.  Systolic murmur is present with a grade of 2/6  2 plus pitting peripheral edema    Pulmonary/Chest: Effort normal and breath sounds normal. No respiratory distress.  Skin: Skin is warm, dry and intact. No rash noted.  Psychiatric: He has a normal mood and affect. His speech is normal and behavior is normal. Thought content normal.          Assessment & Plan:

## 2015-09-23 NOTE — Assessment & Plan Note (Signed)
Recent treatment of flexogenics. Injections.. Improved.

## 2015-09-23 NOTE — Patient Instructions (Addendum)
Hold iron. Limit to 2 beer daily if able.  Start water exercise or aerobics.  Work on low carbohydrate low fat diet. Stop at front desk to set up referral for ECHO.

## 2015-09-23 NOTE — Assessment & Plan Note (Signed)
Counseled on healthy eating, regular exercise and weight loss.

## 2015-10-05 ENCOUNTER — Telehealth (HOSPITAL_COMMUNITY): Payer: Self-pay | Admitting: Family Medicine

## 2015-10-06 NOTE — Telephone Encounter (Signed)
Close encounter 

## 2015-10-07 ENCOUNTER — Other Ambulatory Visit (HOSPITAL_COMMUNITY): Payer: Medicare Other

## 2015-10-19 ENCOUNTER — Other Ambulatory Visit: Payer: Self-pay

## 2015-10-19 ENCOUNTER — Ambulatory Visit (HOSPITAL_COMMUNITY): Payer: Medicare Other | Attending: Cardiology

## 2015-10-19 DIAGNOSIS — I700 Atherosclerosis of aorta: Secondary | ICD-10-CM | POA: Diagnosis not present

## 2015-10-19 DIAGNOSIS — I351 Nonrheumatic aortic (valve) insufficiency: Secondary | ICD-10-CM | POA: Diagnosis not present

## 2015-10-20 ENCOUNTER — Telehealth: Payer: Self-pay | Admitting: Family Medicine

## 2015-10-20 DIAGNOSIS — I351 Nonrheumatic aortic (valve) insufficiency: Secondary | ICD-10-CM

## 2015-10-20 DIAGNOSIS — R011 Cardiac murmur, unspecified: Secondary | ICD-10-CM

## 2015-10-20 DIAGNOSIS — I350 Nonrheumatic aortic (valve) stenosis: Secondary | ICD-10-CM

## 2015-10-20 NOTE — Telephone Encounter (Signed)
-----   Message from Carter Kitten, [place] sent at 10/20/2015 12:27 PM EDT ----- Mr. Certo notified as instructed by telephone.  He is agreeable to the cardiologist referral.  He has seen Dr. [name] in the past.  Ok to leave message with appointment when scheduled.

## 2015-10-20 NOTE — Telephone Encounter (Signed)
Pt returned Donnas call about echo results. Please advise

## 2015-10-20 NOTE — Telephone Encounter (Signed)
Echocardiogram result discussed with patient.  See results note on Echo from 10/19/2015.

## 2015-10-30 NOTE — Progress Notes (Signed)
Cardiology Office Note   Date:  10/31/2015   ID:  Emmanuelle, Connally [birth year], MRN [id number]  PCP:  [name], MD  Cardiologist:   [name], MD  Referring:  [name], MD  Chief Complaint  Patient presents with  . AS/AI      History of Present Illness: Gregory [name] is a 65 y.o. male who presents for follow up of aortic valve disease.  I saw her in 2013 for follow up of aortic insufficiency.  She had a follow up echo earlier this month.  This demonstrated a preserved EF with moderate AS and moderate to severe AI.  Since I last saw him he has done well.  The patient denies any new symptoms such as chest discomfort, neck or arm discomfort. There has been no new shortness of breath, PND or orthopnea. There have been no reported palpitations, presyncope or syncope.  He does have some knee pain and has slowly gained weight and so does not have as much exercise tolerance as he had previously.   Past Medical History:  Diagnosis Date  . Aortic insufficiency   . Hypertension   . Shingles     Past Surgical History:  Procedure Laterality Date  . None       Current Outpatient Prescriptions  Medication Sig Dispense Refill  . amLODipine (NORVASC) 10 MG tablet Take 1 tablet (10 mg total) by mouth daily. 90 tablet 3  . hydrochlorothiazide (HYDRODIURIL) 25 MG tablet Take 1 tablet (25 mg total) by mouth daily. 90 tablet 1  . ibuprofen (ADVIL,MOTRIN) 100 MG chewable tablet Chew 100 mg by mouth every 8 (eight) hours as needed for fever.    [name] lisinopril (PRINIVIL,ZESTRIL) 40 MG tablet Take 1 tablet (40 mg total) by mouth daily. 90 tablet 3   No current facility-administered medications for this visit.     Allergies:   Review of patient's allergies indicates no known allergies.    Social History:  The patient  reports that he has never smoked. He has never used smokeless tobacco. He reports that he drinks about 3.0 oz of alcohol per week . He reports that he does not  use drugs.   Family History:  The patient's family history includes Alcohol abuse in his father; Breast cancer in his sister; Cancer in his mother. He was adopted.    ROS:  Please see the history of present illness.   Otherwise, review of systems are positive for none.   All other systems are reviewed and negative.    PHYSICAL EXAM: VS:  BP (!) 136/54 (BP Location: Right Arm)   Pulse (!) 56   Ht 5\' 8"  (1.727 m)   Wt 276 lb (125.2 kg)   BMI 41.97 kg/m  , BMI Body mass index is 41.97 kg/m. GENERAL:  Well appearing HEENT:  Pupils equal round and reactive, fundi not visualized, oral mucosa unremarkable NECK:  No jugular venous distention, waveform within normal limits, carotid upstroke brisk and symmetric, no bruits, no thyromegaly LYMPHATICS:  No cervical, inguinal adenopathy LUNGS:  Clear to auscultation bilaterally BACK:  No CVA tenderness CHEST:  Unremarkable HEART:  PMI not displaced or sustained,S1 and S2 within normal limits, no S3, no S4, no clicks, no rubs, 2 out of 6 apical systolic murmur radiating slightly out the outflow tract, soft diastolic murmur heard best at the third left intercostal space ABD:  Flat, positive bowel sounds normal in frequency in pitch, no bruits, no rebound, no guarding, no  midline pulsatile mass, no hepatomegaly, no splenomegaly EXT:  2 plus pulses throughout, trace edema, no cyanosis no clubbing SKIN:  No rashes no nodules NEURO:  Cranial nerves II through XII grossly intact, motor grossly intact throughout PSYCH:  Cognitively intact, oriented to person place and time    EKG:  EKG is not ordered today. The ekg ordered 01/11/15 demonstrates sinus rhythm, rate 60, axis within normal limits, intervals within normal limits, no acute ST-T wave changes.   Recent Labs: 01/11/2015: ALT 19; BUN 19; Creatinine, Ser 0.99; Hemoglobin 13.8; Platelets 240.0; Potassium 4.6; Sodium 140; TSH 4.14    Lipid Panel    Component Value Date/Time   CHOL 267 (H)  01/11/2015 1111   TRIG 198.0 (H) 01/11/2015 1111   TRIG 162 (H) 10/25/2005 1507   HDL 52.60 01/11/2015 1111   CHOLHDL 5 01/11/2015 1111   VLDL 39.6 01/11/2015 1111   LDLCALC 174 (H) 01/11/2015 1111   LDLDIRECT 153.3 05/20/2012 0938      Wt Readings from Last 3 Encounters:  10/31/15 276 lb (125.2 kg)  09/23/15 277 lb (125.6 kg)  04/04/15 273 lb (123.8 kg)      Other studies Reviewed: Additional studies/ records that were reviewed today include: Echo. Review of the above records demonstrates:  Please see elsewhere in the note.     ASSESSMENT AND PLAN:  AS/AI:    These are both moderate. He has no symptoms. We had a long discussion about the physiology of this. I will follow this clinically and with a repeat echocardiogram in one year.  HTN:  The blood pressure is at target. No change in medications is indicated. We will continue with therapeutic lifestyle changes (TLC).  OBESITY:  We had a long discussion about this.  The patient understands the need to lose weight with diet and exercise. We have discussed specific strategies for this.  Current medicines are reviewed at length with the patient today.  The patient does not have concerns regarding medicines.  The following changes have been made:  no change  Labs/ tests ordered today include:   Orders Placed This Encounter  Procedures  . ECHOCARDIOGRAM COMPLETE     Disposition:   FU with me in one year.     Signed, [name], MD  10/31/2015 10:50 AM    [place]

## 2015-10-31 ENCOUNTER — Ambulatory Visit (INDEPENDENT_AMBULATORY_CARE_PROVIDER_SITE_OTHER): Payer: Medicare Other | Admitting: Cardiology

## 2015-10-31 ENCOUNTER — Encounter: Payer: Self-pay | Admitting: Cardiology

## 2015-10-31 VITALS — BP 136/54 | HR 56 | Ht 68.0 in | Wt 276.0 lb

## 2015-10-31 DIAGNOSIS — I350 Nonrheumatic aortic (valve) stenosis: Secondary | ICD-10-CM

## 2015-10-31 NOTE — Patient Instructions (Addendum)
Medication Instructions:  Your physician recommends that you continue on your current medications as directed. Please refer to the Current Medication list given to you today.  Labwork: none  Testing/Procedures: Your physician has requested that you have an echocardiogram. Echocardiography is a painless test that uses sound waves to create images of your heart. It provides your doctor with information about the size and shape of your heart and how well your heart's chambers and valves are working. This procedure takes approximately one hour. There are no restrictions for this procedure. 1 year a day or so before follow up ov  Follow-Up: Your physician wants you to follow-up in: 1 year ov day or so after Echo You will receive a reminder letter in the mail two months in advance. If you don't receive a letter, please call our office to schedule the follow-up appointment.  If you need a refill on your cardiac medications before your next appointment, please call your pharmacy.

## 2015-11-17 ENCOUNTER — Other Ambulatory Visit: Payer: Self-pay | Admitting: Family Medicine

## 2015-11-17 DIAGNOSIS — I10 Essential (primary) hypertension: Secondary | ICD-10-CM

## 2015-11-17 NOTE — Telephone Encounter (Signed)
Filled by Dr. Diona Browner since she is new PCP

## 2016-01-03 ENCOUNTER — Telehealth: Payer: Self-pay | Admitting: Family Medicine

## 2016-01-03 DIAGNOSIS — Z125 Encounter for screening for malignant neoplasm of prostate: Secondary | ICD-10-CM

## 2016-01-03 DIAGNOSIS — I10 Essential (primary) hypertension: Secondary | ICD-10-CM

## 2016-01-03 DIAGNOSIS — D509 Iron deficiency anemia, unspecified: Secondary | ICD-10-CM

## 2016-01-03 DIAGNOSIS — R7303 Prediabetes: Secondary | ICD-10-CM

## 2016-01-03 NOTE — Telephone Encounter (Signed)
-----   Message from Marchia Bond sent at 12/27/2015  1:46 PM EST ----- Regarding: Dm f/u labs Thurs 1/4, need orders. Thanks :-) Please order future dm f/u labs for pt's upcoming lab appt. Thanks Aniceto Boss

## 2016-01-03 NOTE — Telephone Encounter (Signed)
Please correct pt's appt.Gregory [name] He is scheduled for DM follow up.. Not sure why at last OV started make AMW with labs prior in 3 months. Please correct appt and move to different day if needed to put in correct slot... Or if he is agreeable, schedule him to see Benita Stabile first

## 2016-01-04 DIAGNOSIS — R262 Difficulty in walking, not elsewhere classified: Secondary | ICD-10-CM | POA: Diagnosis not present

## 2016-01-04 DIAGNOSIS — M25562 Pain in left knee: Secondary | ICD-10-CM | POA: Diagnosis not present

## 2016-01-04 DIAGNOSIS — M170 Bilateral primary osteoarthritis of knee: Secondary | ICD-10-CM | POA: Diagnosis not present

## 2016-01-04 DIAGNOSIS — M25561 Pain in right knee: Secondary | ICD-10-CM | POA: Diagnosis not present

## 2016-01-04 NOTE — Telephone Encounter (Signed)
Will forward to Melissa to have her reschedule patient since she ordinally scheduled him.  Please see Dr. Rometta Emery note below.

## 2016-01-05 ENCOUNTER — Other Ambulatory Visit (INDEPENDENT_AMBULATORY_CARE_PROVIDER_SITE_OTHER): Payer: Medicare Other

## 2016-01-05 DIAGNOSIS — I10 Essential (primary) hypertension: Secondary | ICD-10-CM

## 2016-01-05 DIAGNOSIS — R7303 Prediabetes: Secondary | ICD-10-CM

## 2016-01-05 DIAGNOSIS — Z125 Encounter for screening for malignant neoplasm of prostate: Secondary | ICD-10-CM | POA: Diagnosis not present

## 2016-01-05 DIAGNOSIS — D509 Iron deficiency anemia, unspecified: Secondary | ICD-10-CM | POA: Diagnosis not present

## 2016-01-05 LAB — CBC WITH DIFFERENTIAL/PLATELET
Basophils Absolute: 0.1 10*3/uL (ref 0.0–0.1)
Basophils Relative: 0.7 % (ref 0.0–3.0)
Eosinophils Absolute: 0.1 10*3/uL (ref 0.0–0.7)
Eosinophils Relative: 2.1 % (ref 0.0–5.0)
HCT: 38.1 % — ABNORMAL LOW (ref 39.0–52.0)
Hemoglobin: 13.2 g/dL (ref 13.0–17.0)
Lymphocytes Relative: 32.8 % (ref 12.0–46.0)
Lymphs Abs: 2.3 10*3/uL (ref 0.7–4.0)
MCHC: 34.5 g/dL (ref 30.0–36.0)
MCV: 93.6 fl (ref 78.0–100.0)
Monocytes Absolute: 0.6 10*3/uL (ref 0.1–1.0)
Monocytes Relative: 8.7 % (ref 3.0–12.0)
Neutro Abs: 3.8 10*3/uL (ref 1.4–7.7)
Neutrophils Relative %: 55.7 % (ref 43.0–77.0)
Platelets: 222 10*3/uL (ref 150.0–400.0)
RBC: 4.07 Mil/uL — ABNORMAL LOW (ref 4.22–5.81)
RDW: 12.9 % (ref 11.5–15.5)
WBC: 6.9 10*3/uL (ref 4.0–10.5)

## 2016-01-05 LAB — LIPID PANEL
Cholesterol: 240 mg/dL — ABNORMAL HIGH (ref 0–200)
HDL: 48.7 mg/dL (ref >39.00)
NonHDL: 190.99
Total CHOL/HDL Ratio: 5
Triglycerides: 207 mg/dL — ABNORMAL HIGH (ref 0.0–149.0)
VLDL: 41.4 mg/dL — ABNORMAL HIGH (ref 0.0–40.0)

## 2016-01-05 LAB — COMPREHENSIVE METABOLIC PANEL
ALT: 15 U/L (ref 0–53)
AST: 14 U/L (ref 0–37)
Albumin: 4.1 g/dL (ref 3.5–5.2)
Alkaline Phosphatase: 41 U/L (ref 39–117)
BUN: 21 mg/dL (ref 6–23)
CO2: 33 mEq/L — ABNORMAL HIGH (ref 19–32)
Calcium: 9.3 mg/dL (ref 8.4–10.5)
Chloride: 102 mEq/L (ref 96–112)
Creatinine, Ser: 1.03 mg/dL (ref 0.40–1.50)
GFR: 76.48 mL/min (ref >60.00)
Glucose, Bld: 120 mg/dL — ABNORMAL HIGH (ref 70–99)
Potassium: 4.5 mEq/L (ref 3.5–5.1)
Sodium: 141 mEq/L (ref 135–145)
Total Bilirubin: 0.5 mg/dL (ref 0.2–1.2)
Total Protein: 6.3 g/dL (ref 6.0–8.3)

## 2016-01-05 LAB — HEMOGLOBIN A1C: Hgb A1c MFr Bld: 5.7 % (ref 4.6–6.5)

## 2016-01-05 LAB — PSA, MEDICARE: PSA: 0.41 ng/ml (ref 0.10–4.00)

## 2016-01-05 LAB — LDL CHOLESTEROL, DIRECT: Direct LDL: 151 mg/dL

## 2016-01-09 NOTE — Telephone Encounter (Signed)
Called pt to change appt, left message [phone]

## 2016-01-12 ENCOUNTER — Ambulatory Visit (INDEPENDENT_AMBULATORY_CARE_PROVIDER_SITE_OTHER): Payer: Medicare Other | Admitting: Family Medicine

## 2016-01-12 ENCOUNTER — Encounter: Payer: Self-pay | Admitting: Family Medicine

## 2016-01-12 DIAGNOSIS — D509 Iron deficiency anemia, unspecified: Secondary | ICD-10-CM

## 2016-01-12 DIAGNOSIS — E1169 Type 2 diabetes mellitus with other specified complication: Secondary | ICD-10-CM | POA: Diagnosis not present

## 2016-01-12 DIAGNOSIS — E7800 Pure hypercholesterolemia, unspecified: Secondary | ICD-10-CM | POA: Insufficient documentation

## 2016-01-12 DIAGNOSIS — I10 Essential (primary) hypertension: Secondary | ICD-10-CM | POA: Diagnosis not present

## 2016-01-12 DIAGNOSIS — E669 Obesity, unspecified: Secondary | ICD-10-CM

## 2016-01-12 LAB — HM DIABETES FOOT EXAM

## 2016-01-12 MED ORDER — ATORVASTATIN CALCIUM 40 MG PO TABS: 40.0000 mg | ORAL_TABLET | Freq: Every day | ORAL | 11 refills | Status: DC

## 2016-01-12 NOTE — Progress Notes (Signed)
Pre visit review using our clinic review tool, if applicable. No additional management support is needed unless otherwise documented below in the visit note. 

## 2016-01-12 NOTE — Progress Notes (Signed)
   Subjective:    Patient ID: Gregory [name], male    DOB: 09-14-1950, 66 y.o.   MRN: [id number]  Diabetes  Pertinent negatives for diabetes include no chest pain and no fatigue.      65 year old male pt presents for 3 month follow up DM.   Diabetes:  Excellent control Lab Results  Component Value Date   HGBA1C 5.7 01/05/2016  Using medications without difficulties: Hypoglycemic episodes:? Hyperglycemic episodes: ? Feet problems: none Blood Sugars averaging: not checking eye exam within last year:  Hypertension:   stable control on amlodipine, HCTZ and lisinopril. 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  High risk CVD with DM history.. On NO statin.. rec high dose statin. LD far from goal. Lab Results  Component Value Date   CHOL 240 (H) 01/05/2016   HDL 48.70 01/05/2016   LDLCALC 174 (H) 01/11/2015   LDLDIRECT 151.0 01/05/2016   TRIG 207.0 (H) 01/05/2016   CHOLHDL 5 01/05/2016    Aortic insufficiency:mderate, asymptomatic  Seen 10/2015 by Dr. Percival Spanish.  Plan repeat ECHO in 1 year   Anemia, resolved. No current indication for iron supplements  Review of Systems  Constitutional: Negative for fatigue and fever.  HENT: Negative for ear pain.   Eyes: Negative for pain.  Respiratory: Negative for shortness of breath.   Cardiovascular: Negative for chest pain.  Gastrointestinal: Negative for abdominal distention.       Objective:   Physical Exam  Constitutional: Vital signs are  normal. He appears well-developed and well-nourished.  Morbidly obese  HENT:  Head: Normocephalic.  Right Ear: Hearing normal.  Left Ear: Hearing normal.  Nose: Nose normal.  Mouth/Throat: Oropharynx is clear and moist and mucous membranes are normal.  Neck: Trachea normal. Carotid bruit is not present. No thyroid mass and no thyromegaly present.  Cardiovascular: Normal rate, regular rhythm and normal pulses.  Exam reveals no gallop, no distant heart sounds and no friction rub.   Murmur heard.  Systolic murmur is present with a grade of 2/6  No peripheral edema  Pulmonary/Chest: Effort normal and breath sounds normal. No respiratory distress.  1 plus pitting edema  Skin: Skin is warm, dry and intact. No rash noted.  Psychiatric: He has a normal mood and affect. His speech is normal and behavior is normal. Thought content normal.          Assessment & Plan:

## 2016-01-12 NOTE — Assessment & Plan Note (Signed)
High risk for CVD. Rec high to mod dose statin.  Start atorvastatin 40 mg daily.  Follow up with labs in 3 months.

## 2016-01-12 NOTE — Assessment & Plan Note (Signed)
Well controlled. Continue current medication.  

## 2016-01-12 NOTE — Assessment & Plan Note (Signed)
Resolved off iron.

## 2016-01-12 NOTE — Assessment & Plan Note (Addendum)
Well controlled on no med. Work on [employer].

## 2016-01-12 NOTE — Assessment & Plan Note (Signed)
Encouraged exercise, weight loss, healthy eating habits. ? ?

## 2016-01-12 NOTE — Patient Instructions (Addendum)
Start atorvastatin 40 mg daily. Work on low cholesterol diet, low carb diet, weight loss and try to increase exercise. Decreased the cheese. Make sure to have yearly eye exam.

## 2016-01-31 ENCOUNTER — Other Ambulatory Visit: Payer: Self-pay | Admitting: *Deleted

## 2016-01-31 MED ORDER — AMLODIPINE BESYLATE 10 MG PO TABS: 10.0000 mg | ORAL_TABLET | Freq: Every day | ORAL | 3 refills | Status: DC

## 2016-02-12 ENCOUNTER — Other Ambulatory Visit: Payer: Self-pay | Admitting: Family Medicine

## 2016-02-12 DIAGNOSIS — I10 Essential (primary) hypertension: Secondary | ICD-10-CM

## 2016-02-23 DIAGNOSIS — G8929 Other chronic pain: Secondary | ICD-10-CM | POA: Diagnosis not present

## 2016-02-23 DIAGNOSIS — M25562 Pain in left knee: Secondary | ICD-10-CM | POA: Diagnosis not present

## 2016-02-23 DIAGNOSIS — M170 Bilateral primary osteoarthritis of knee: Secondary | ICD-10-CM | POA: Diagnosis not present

## 2016-04-10 ENCOUNTER — Telehealth: Payer: Self-pay | Admitting: Family Medicine

## 2016-04-10 DIAGNOSIS — E7800 Pure hypercholesterolemia, unspecified: Secondary | ICD-10-CM

## 2016-04-10 DIAGNOSIS — Z1159 Encounter for screening for other viral diseases: Secondary | ICD-10-CM

## 2016-04-10 DIAGNOSIS — D509 Iron deficiency anemia, unspecified: Secondary | ICD-10-CM

## 2016-04-10 DIAGNOSIS — E1169 Type 2 diabetes mellitus with other specified complication: Secondary | ICD-10-CM

## 2016-04-10 DIAGNOSIS — E669 Obesity, unspecified: Principal | ICD-10-CM

## 2016-04-10 NOTE — Telephone Encounter (Signed)
-----   Message from Ellamae Sia sent at 03/26/2016  3:28 PM EDT ----- Regarding: Lab orders for Wednesday, 4.11.18 Lab orders for a f/u appt

## 2016-04-11 ENCOUNTER — Encounter (INDEPENDENT_AMBULATORY_CARE_PROVIDER_SITE_OTHER): Payer: Self-pay

## 2016-04-11 ENCOUNTER — Other Ambulatory Visit (INDEPENDENT_AMBULATORY_CARE_PROVIDER_SITE_OTHER): Payer: Medicare Other

## 2016-04-11 DIAGNOSIS — E669 Obesity, unspecified: Secondary | ICD-10-CM | POA: Diagnosis not present

## 2016-04-11 DIAGNOSIS — E7800 Pure hypercholesterolemia, unspecified: Secondary | ICD-10-CM | POA: Diagnosis not present

## 2016-04-11 DIAGNOSIS — Z1159 Encounter for screening for other viral diseases: Secondary | ICD-10-CM

## 2016-04-11 DIAGNOSIS — E1169 Type 2 diabetes mellitus with other specified complication: Secondary | ICD-10-CM

## 2016-04-11 LAB — COMPREHENSIVE METABOLIC PANEL
ALT: 16 U/L (ref 0–53)
AST: 15 U/L (ref 0–37)
Albumin: 4 g/dL (ref 3.5–5.2)
Alkaline Phosphatase: 48 U/L (ref 39–117)
BUN: 21 mg/dL (ref 6–23)
CO2: 29 mEq/L (ref 19–32)
Calcium: 9 mg/dL (ref 8.4–10.5)
Chloride: 102 mEq/L (ref 96–112)
Creatinine, Ser: 0.9 mg/dL (ref 0.40–1.50)
GFR: 89.29 mL/min (ref >60.00)
Glucose, Bld: 101 mg/dL — ABNORMAL HIGH (ref 70–99)
Potassium: 4.3 mEq/L (ref 3.5–5.1)
Sodium: 137 mEq/L (ref 135–145)
Total Bilirubin: 0.5 mg/dL (ref 0.2–1.2)
Total Protein: 6.6 g/dL (ref 6.0–8.3)

## 2016-04-11 LAB — LIPID PANEL
Cholesterol: 151 mg/dL (ref 0–200)
HDL: 58.7 mg/dL (ref >39.00)
LDL Cholesterol: 74 mg/dL (ref 0–99)
NonHDL: 92.2
Total CHOL/HDL Ratio: 3
Triglycerides: 91 mg/dL (ref 0.0–149.0)
VLDL: 18.2 mg/dL (ref 0.0–40.0)

## 2016-04-11 LAB — HEMOGLOBIN A1C: Hgb A1c MFr Bld: 5.9 % (ref 4.6–6.5)

## 2016-04-12 LAB — HEPATITIS C ANTIBODY: HCV Ab: NEGATIVE

## 2016-04-17 ENCOUNTER — Ambulatory Visit (INDEPENDENT_AMBULATORY_CARE_PROVIDER_SITE_OTHER): Payer: Medicare Other | Admitting: Family Medicine

## 2016-04-17 ENCOUNTER — Encounter: Payer: Self-pay | Admitting: Family Medicine

## 2016-04-17 DIAGNOSIS — M5442 Lumbago with sciatica, left side: Secondary | ICD-10-CM | POA: Diagnosis not present

## 2016-04-17 DIAGNOSIS — Z23 Encounter for immunization: Secondary | ICD-10-CM

## 2016-04-17 DIAGNOSIS — M545 Low back pain, unspecified: Secondary | ICD-10-CM | POA: Insufficient documentation

## 2016-04-17 DIAGNOSIS — E7800 Pure hypercholesterolemia, unspecified: Secondary | ICD-10-CM

## 2016-04-17 NOTE — Progress Notes (Signed)
Pre visit review using our clinic review tool, if applicable. No additional management support is needed unless otherwise documented below in the visit note. 

## 2016-04-17 NOTE — Addendum Note (Signed)
Addended by: Carter Kitten on: 04/17/2016 09:46 AM   Modules accepted: Orders

## 2016-04-17 NOTE — Progress Notes (Signed)
   Subjective:    Patient ID: Gregory [name], male    DOB: 1950/06/09, 66 y.o.   MRN: [id number]  HPI   66 year old male with DM, HTN, morbid obesity presents for 3 month follow up on cholesterol.  Elevated Cholesterol: Started on atorvastatin 40 mg daily. LDL now at goal, much improved. LFTs stable. Lab Results  Component Value Date   CHOL 151 04/11/2016   HDL 58.70 04/11/2016   LDLCALC 74 04/11/2016   LDLDIRECT 151.0 01/05/2016   TRIG 91.0 04/11/2016   CHOLHDL 3 04/11/2016  Using medications without problems: none Muscle aches: none Diet compliance: moderate Exercise: minimal due to knee.. Active at work. Other complaints:  DM stable: Lab Results  Component Value Date   HGBA1C 5.9 04/11/2016     Wt Readings from Last 3 Encounters:  04/17/16 268 lb 12 oz (121.9 kg)  01/12/16 272 lb 4 oz (123.5 kg)  10/31/15 276 lb (125.2 kg)    He has been having 2 weeks of left buttock pain, radiating to left foot. No numbness, no weakness.  no incontinence.  No fall no new injury.  He carried weight at work.  Ibuprofen 800 mg twice daily.. Doesn't help much... Uses mainly for knees.  Blood pressure (!) 140/58, pulse (!) 50, temperature 97.4 F (36.3 C), temperature source Oral, height 5\' 7"  (1.702 m), weight 268 lb 12 oz (121.9 kg).   Review of Systems  Constitutional: Negative for fatigue.  HENT: Negative for ear pain.   Eyes: Negative for pain.  Respiratory: Negative for shortness of breath and wheezing.   Cardiovascular: Negative for chest pain.       Objective:   Physical Exam  Constitutional: Vital signs are normal. He appears well-developed and well-nourished.  Central obesity  HENT:  Head: Normocephalic.  Right Ear: Hearing normal.  Left Ear: Hearing normal.  Nose: Nose normal.  Mouth/Throat: Oropharynx is clear and moist and mucous membranes are normal.  Neck: Trachea normal. Carotid bruit is not present. No thyroid mass and no thyromegaly present.    Cardiovascular: Normal rate, regular rhythm and normal pulses.  Exam reveals no gallop, no distant heart sounds and no friction rub.   No murmur heard. No peripheral edema  Pulmonary/Chest: Effort normal and breath sounds normal. No respiratory distress.  Musculoskeletal:   Decreased ROM back  no ttp, neg SLR  Neurological: He has normal strength. He displays no atrophy. No sensory deficit. He exhibits normal muscle tone. Gait abnormal. Coordination normal.   Stiff gait   Skin: Skin is warm, dry and intact. No rash noted.  Psychiatric: He has a normal mood and affect. His speech is normal and behavior is normal. Thought content normal.          Assessment & Plan:

## 2016-04-17 NOTE — Patient Instructions (Signed)
Heat on low back. Start gentle stretching.  Try to use ibuprofen prn only.  Continue atorvastatin daily longterm. Call if left buttock pain not improving.

## 2016-04-17 NOTE — Assessment & Plan Note (Addendum)
Treat with heat, NSAIDs and start ROM exercises. Follow up if not improving in 2 weeks.

## 2016-04-17 NOTE — Assessment & Plan Note (Signed)
LDl now improved on atorvastatin.  Encouraged exercise, weight loss, healthy eating habits.

## 2016-04-18 DIAGNOSIS — M1711 Unilateral primary osteoarthritis, right knee: Secondary | ICD-10-CM | POA: Diagnosis not present

## 2016-04-18 DIAGNOSIS — M2022 Hallux rigidus, left foot: Secondary | ICD-10-CM | POA: Diagnosis not present

## 2016-04-18 DIAGNOSIS — M1712 Unilateral primary osteoarthritis, left knee: Secondary | ICD-10-CM | POA: Diagnosis not present

## 2016-05-12 ENCOUNTER — Other Ambulatory Visit: Payer: Self-pay | Admitting: Family Medicine

## 2016-05-12 DIAGNOSIS — I10 Essential (primary) hypertension: Secondary | ICD-10-CM

## 2016-08-23 ENCOUNTER — Telehealth: Payer: Self-pay | Admitting: Family Medicine

## 2016-09-27 NOTE — Telephone Encounter (Signed)
08/23/16-Left pt message asking to call Ebony Hail back directly at [phone] to schedule AWV + labs with Katha Cabal and CPE with PCP.  *NOTE* No hx of AWV   09/27/16-Left pt message asking to call Ebony Hail back directly at [phone] to schedule AWV + labs with Katha Cabal and CPE with PCP.  *NOTE* No hx of AWV

## 2016-10-10 DIAGNOSIS — S0101XD Laceration without foreign body of scalp, subsequent encounter: Secondary | ICD-10-CM | POA: Diagnosis not present

## 2016-10-10 DIAGNOSIS — S0101XA Laceration without foreign body of scalp, initial encounter: Secondary | ICD-10-CM | POA: Diagnosis not present

## 2016-10-10 DIAGNOSIS — Z23 Encounter for immunization: Secondary | ICD-10-CM | POA: Diagnosis not present

## 2016-10-13 DIAGNOSIS — S61311D Laceration without foreign body of left index finger with damage to nail, subsequent encounter: Secondary | ICD-10-CM | POA: Diagnosis not present

## 2016-10-17 DIAGNOSIS — S61211D Laceration without foreign body of left index finger without damage to nail, subsequent encounter: Secondary | ICD-10-CM | POA: Diagnosis not present

## 2016-10-17 DIAGNOSIS — Z4802 Encounter for removal of sutures: Secondary | ICD-10-CM | POA: Diagnosis not present

## 2016-10-30 ENCOUNTER — Ambulatory Visit (INDEPENDENT_AMBULATORY_CARE_PROVIDER_SITE_OTHER): Payer: Medicare Other | Admitting: Family Medicine

## 2016-10-30 ENCOUNTER — Encounter: Payer: Self-pay | Admitting: Family Medicine

## 2016-10-30 DIAGNOSIS — F418 Other specified anxiety disorders: Secondary | ICD-10-CM

## 2016-10-30 MED ORDER — ALPRAZOLAM 0.5 MG PO TBDP: 0.5000 mg | ORAL_TABLET | Freq: Every day | ORAL | 0 refills | Status: DC | PRN

## 2016-10-30 MED ORDER — ALPRAZOLAM 0.25 MG PO TABS: 0.2500 mg | ORAL_TABLET | Freq: Every day | ORAL | 0 refills | Status: DC | PRN

## 2016-10-30 NOTE — Assessment & Plan Note (Signed)
Associated with plane flights and some elements of claustrophobia. No clear panic disorder or GAD.  Denies depression.

## 2016-10-30 NOTE — Progress Notes (Signed)
   Subjective:    Patient ID: Gregory [name], male    DOB: 03-Sep-1950, 66 y.o.   MRN: [id number]  HPI  66 year old male presents for   Medication for a flight to [facility]  He has a history of flight associated anxiety. Hyperventilating, panic attack. He has noted claustrophobia when crawling under a house. He has removed.   Has no other depression and anxiety. Denies SI.  GAD 7: 4    Review of Systems  Constitutional: Negative for diaphoresis.  HENT: Negative for ear pain.   Eyes: Negative for pain.  Respiratory: Negative for cough.   Cardiovascular: Negative for chest pain.  Gastrointestinal: Negative for abdominal distention.       Objective:   Physical Exam  Constitutional: Vital signs are normal. He appears well-developed and well-nourished.  HENT:  Head: Normocephalic.  Right Ear: Hearing normal.  Left Ear: Hearing normal.  Nose: Nose normal.  Mouth/Throat: Oropharynx is clear and moist and mucous membranes are normal.  Neck: Trachea normal. Carotid bruit is not present. No thyroid mass and no thyromegaly present.  Cardiovascular: Normal rate, regular rhythm and normal pulses.  Exam reveals no gallop, no distant heart sounds and no friction rub.   No murmur heard. No peripheral edema  Pulmonary/Chest: Effort normal and breath sounds normal. No respiratory distress.  Skin: Skin is warm, dry and intact. No rash noted.  Psychiatric: He has a normal mood and affect. His speech is normal and behavior is normal. Thought content normal.          Assessment & Plan:

## 2016-10-31 ENCOUNTER — Ambulatory Visit: Payer: Self-pay

## 2016-10-31 DIAGNOSIS — I10 Essential (primary) hypertension: Secondary | ICD-10-CM

## 2016-10-31 MED ORDER — HYDROCHLOROTHIAZIDE 25 MG PO TABS: 25.0000 mg | ORAL_TABLET | Freq: Every day | ORAL | 1 refills | Status: DC

## 2016-12-12 ENCOUNTER — Telehealth: Payer: Self-pay | Admitting: Family Medicine

## 2016-12-12 ENCOUNTER — Ambulatory Visit: Payer: Medicare Other

## 2016-12-12 ENCOUNTER — Other Ambulatory Visit (INDEPENDENT_AMBULATORY_CARE_PROVIDER_SITE_OTHER): Payer: Medicare Other

## 2016-12-12 DIAGNOSIS — E1169 Type 2 diabetes mellitus with other specified complication: Secondary | ICD-10-CM | POA: Diagnosis not present

## 2016-12-12 DIAGNOSIS — E669 Obesity, unspecified: Secondary | ICD-10-CM | POA: Diagnosis not present

## 2016-12-12 DIAGNOSIS — D509 Iron deficiency anemia, unspecified: Secondary | ICD-10-CM | POA: Diagnosis not present

## 2016-12-12 DIAGNOSIS — Z125 Encounter for screening for malignant neoplasm of prostate: Secondary | ICD-10-CM

## 2016-12-12 LAB — CBC WITH DIFFERENTIAL/PLATELET
Basophils Absolute: 0.1 10*3/uL (ref 0.0–0.1)
Basophils Relative: 0.8 % (ref 0.0–3.0)
Eosinophils Absolute: 0.1 10*3/uL (ref 0.0–0.7)
Eosinophils Relative: 1.5 % (ref 0.0–5.0)
HCT: 40.1 % (ref 39.0–52.0)
Hemoglobin: 13.3 g/dL (ref 13.0–17.0)
Lymphocytes Relative: 26.7 % (ref 12.0–46.0)
Lymphs Abs: 1.8 10*3/uL (ref 0.7–4.0)
MCHC: 33.2 g/dL (ref 30.0–36.0)
MCV: 97.5 fl (ref 78.0–100.0)
Monocytes Absolute: 0.8 10*3/uL (ref 0.1–1.0)
Monocytes Relative: 11.2 % (ref 3.0–12.0)
Neutro Abs: 4.1 10*3/uL (ref 1.4–7.7)
Neutrophils Relative %: 59.8 % (ref 43.0–77.0)
Platelets: 204 10*3/uL (ref 150.0–400.0)
RBC: 4.12 Mil/uL — ABNORMAL LOW (ref 4.22–5.81)
RDW: 13.8 % (ref 11.5–15.5)
WBC: 6.8 10*3/uL (ref 4.0–10.5)

## 2016-12-12 LAB — COMPREHENSIVE METABOLIC PANEL
ALT: 12 U/L (ref 0–53)
AST: 15 U/L (ref 0–37)
Albumin: 4.2 g/dL (ref 3.5–5.2)
Alkaline Phosphatase: 43 U/L (ref 39–117)
BUN: 20 mg/dL (ref 6–23)
CO2: 30 mEq/L (ref 19–32)
Calcium: 9.1 mg/dL (ref 8.4–10.5)
Chloride: 101 mEq/L (ref 96–112)
Creatinine, Ser: 1.03 mg/dL (ref 0.40–1.50)
GFR: 76.26 mL/min (ref >60.00)
Glucose, Bld: 97 mg/dL (ref 70–99)
Potassium: 4.4 mEq/L (ref 3.5–5.1)
Sodium: 140 mEq/L (ref 135–145)
Total Bilirubin: 0.7 mg/dL (ref 0.2–1.2)
Total Protein: 6.4 g/dL (ref 6.0–8.3)

## 2016-12-12 LAB — LIPID PANEL
Cholesterol: 156 mg/dL (ref 0–200)
HDL: 57.9 mg/dL (ref >39.00)
LDL Cholesterol: 58 mg/dL (ref 0–99)
NonHDL: 97.66
Total CHOL/HDL Ratio: 3
Triglycerides: 197 mg/dL — ABNORMAL HIGH (ref 0.0–149.0)
VLDL: 39.4 mg/dL (ref 0.0–40.0)

## 2016-12-12 LAB — PSA, MEDICARE: PSA: 0.42 ng/ml (ref 0.10–4.00)

## 2016-12-12 LAB — HEMOGLOBIN A1C: Hgb A1c MFr Bld: 5.9 % (ref 4.6–6.5)

## 2016-12-12 NOTE — Telephone Encounter (Signed)
-----   Message from Ellamae Sia sent at 12/12/2016  9:45 AM EST ----- Regarding: Lab orders asap Patient is scheduled for CPX labs, please order future labs, Thanks , Karna Christmas

## 2016-12-18 ENCOUNTER — Other Ambulatory Visit: Payer: Self-pay

## 2016-12-18 ENCOUNTER — Encounter: Payer: Self-pay | Admitting: Family Medicine

## 2016-12-18 ENCOUNTER — Ambulatory Visit (INDEPENDENT_AMBULATORY_CARE_PROVIDER_SITE_OTHER): Payer: Medicare Other | Admitting: Family Medicine

## 2016-12-18 VITALS — BP 140/60 | HR 59 | Temp 97.5°F | Ht 68.0 in | Wt 276.5 lb

## 2016-12-18 DIAGNOSIS — I351 Nonrheumatic aortic (valve) insufficiency: Secondary | ICD-10-CM

## 2016-12-18 DIAGNOSIS — E7800 Pure hypercholesterolemia, unspecified: Secondary | ICD-10-CM

## 2016-12-18 DIAGNOSIS — E1169 Type 2 diabetes mellitus with other specified complication: Secondary | ICD-10-CM

## 2016-12-18 DIAGNOSIS — E669 Obesity, unspecified: Secondary | ICD-10-CM | POA: Diagnosis not present

## 2016-12-18 DIAGNOSIS — I10 Essential (primary) hypertension: Secondary | ICD-10-CM

## 2016-12-18 DIAGNOSIS — M170 Bilateral primary osteoarthritis of knee: Secondary | ICD-10-CM

## 2016-12-18 DIAGNOSIS — R609 Edema, unspecified: Secondary | ICD-10-CM | POA: Diagnosis not present

## 2016-12-18 DIAGNOSIS — Z0000 Encounter for general adult medical examination without abnormal findings: Secondary | ICD-10-CM | POA: Diagnosis not present

## 2016-12-18 DIAGNOSIS — F418 Other specified anxiety disorders: Secondary | ICD-10-CM | POA: Diagnosis not present

## 2016-12-18 MED ORDER — LISINOPRIL 40 MG PO TABS: 40.0000 mg | ORAL_TABLET | Freq: Every day | ORAL | 3 refills | Status: DC

## 2016-12-18 MED ORDER — FUROSEMIDE 20 MG PO TABS: 20.0000 mg | ORAL_TABLET | Freq: Every day | ORAL | 3 refills | Status: DC | PRN

## 2016-12-18 MED ORDER — ATORVASTATIN CALCIUM 40 MG PO TABS: 40.0000 mg | ORAL_TABLET | Freq: Every day | ORAL | 3 refills | Status: DC

## 2016-12-18 MED ORDER — AMLODIPINE BESYLATE 10 MG PO TABS: 10.0000 mg | ORAL_TABLET | Freq: Every day | ORAL | 3 refills | Status: DC

## 2016-12-18 MED ORDER — ALPRAZOLAM 0.5 MG PO TABS: 0.5000 mg | ORAL_TABLET | Freq: Every day | ORAL | 0 refills | Status: DC | PRN

## 2016-12-18 MED ORDER — POTASSIUM CHLORIDE CRYS ER 20 MEQ PO TBCR: 20.0000 meq | EXTENDED_RELEASE_TABLET | Freq: Every day | ORAL | 3 refills | Status: DC | PRN

## 2016-12-18 MED ORDER — ALPRAZOLAM 0.25 MG PO TABS: 0.2500 mg | ORAL_TABLET | Freq: Every day | ORAL | 0 refills | Status: DC | PRN

## 2016-12-18 NOTE — Progress Notes (Addendum)
Subjective:    Patient ID: Gregory [name], male    DOB: [birth year], 66 y.o.   MRN: [id number]  HPI  The patient presents for annual medicare wellness, complete physical and review of chronic health problems. He/She also has the following acute concerns today: Bilateral knee pain.  I have personally reviewed the Medicare Annual Wellness questionnaire and have noted 1. The patient's medical and social history 2. Their use of alcohol, tobacco or illicit drugs 3. Their current medications and supplements 4. The patient's functional ability including ADL's, fall risks, home safety risks and hearing or visual             impairment. 5. Diet and physical activities 6. Evidence for depression or mood disorders 7.         Updated provider list Cognitive evaluation was performed and recorded on pt medicare questionnaire form. The patients weight, height, BMI and visual acuity have been recorded in the chart  I have made referrals, counseling and provided education to the patient based review of the above and I have provided the pt with a written personalized care plan for preventive services.   Documentation of this information was scanned into the electronic record under the media tab.   Bilateral knee pain: secondary to OA.[name] Left knee steroid injection last year.  He has been  Having increased pain in right knee, gradually worse in last 3-4 months. Interested in steroid injection in right knee.  No recent fall, or change in activity.  Diabetes:   Well controlled Lab Results  Component Value Date   HGBA1C 5.9 12/12/2016  Using medications without difficulties: Hypoglycemic episodes: Hyperglycemic episodes: Feet problems: none Blood Sugars averaging: not checking eye exam within last year: DUE  Hypertension:   Borderline control in office today.. On HCTZ, amlodipine, lisinopril BP Readings from Last 3 Encounters:  12/18/16 140/60  10/30/16 138/66  04/17/16 (!) 140/58  Using  medication without problems or lightheadedness:  Chest pain with exertion: none Edema: Improved initially on HCTZ, but now has returned bilaterally, left greater than right. Short of breath:  Stable with exertion Average home BPs: not checking at home. Other issues:   Peripheral edema eval  ECHO 2017: changes of aortic valve have progressed some from 2013. Recommend referral to cardiology for further recommendations/eval.  HE HAS NOT SEEN YET. Heart squeeze strength Is normal  Elevated Cholesterol:. At goal on atorvastatin 40 mg daily Lab Results  Component Value Date   CHOL 156 12/12/2016   HDL 57.90 12/12/2016   LDLCALC 58 12/12/2016   LDLDIRECT 151.0 01/05/2016   TRIG 197.0 (H) 12/12/2016   CHOLHDL 3 12/12/2016  Using medications without problems: Muscle aches:  Diet compliance: moderate Exercise: he remains fairly active.. walking Other complaints:  Body mass index is 42.04 kg/m. Wt Readings from Last 3 Encounters:  12/18/16 276 lb 8 oz (125.4 kg)  10/30/16 275 lb (124.7 kg)  04/17/16 268 lb 12 oz (121.9 kg)   Anxiety situation on plane flight, Need 3-4 at a time.  Occ using in stores.. Agoraphobia.. Occ.  PHQ2: 2   Social History /Family History/Past Medical History reviewed in detail and updated in EMR if needed. Pulse (!) 59, temperature (!) 97.5 F (36.4 C), temperature source Oral, height 5\' 8"  (1.727 m), weight 276 lb 8 oz (125.4 kg).   Visual Acuity Screening   Right eye Left eye Both eyes  Without correction:     With correction: 20/40 20/25 20/25    Advance directives and  end of life planning reviewed in detail with patient and documented in EMR. Patient given handout on advance care directives if needed. HCPOA and living will updated if needed. Fall Risk  12/18/2016 01/11/2015  Falls in the past year? No Yes  Number falls in past yr: - 1  Injury with Fall? - Yes    Review of Systems  Constitutional: Negative for fatigue and fever.  HENT: Negative  for ear pain.   Eyes: Negative for pain.  Respiratory: Positive for shortness of breath. Negative for cough.   Cardiovascular: Positive for leg swelling. Negative for chest pain and palpitations.  Gastrointestinal: Negative for abdominal pain.  Genitourinary: Negative for dysuria.  Musculoskeletal: Negative for arthralgias.  Neurological: Negative for syncope, light-headedness and headaches.  Psychiatric/Behavioral: Negative for dysphoric mood.       Objective:   Physical Exam  Constitutional: He appears well-developed and well-nourished.  Non-toxic appearance. He does not appear ill. No distress.  HENT:  Head: Normocephalic and atraumatic.  Right Ear: Hearing, tympanic membrane, external ear and ear canal normal.  Left Ear: Hearing, tympanic membrane, external ear and ear canal normal.  Nose: Nose normal.  Mouth/Throat: Uvula is midline, oropharynx is clear and moist and mucous membranes are normal.  Eyes: Conjunctivae, EOM and lids are normal. Pupils are equal, round, and reactive to light. Lids are everted and swept, no foreign bodies found.  Neck: Trachea normal, normal range of motion and phonation normal. Neck supple. Carotid bruit is not present. No thyroid mass and no thyromegaly present.  Cardiovascular: Normal rate, regular rhythm, S1 normal, S2 normal, intact distal pulses and normal pulses. Exam reveals no gallop.  No murmur heard. Pulmonary/Chest: Breath sounds normal. He has no wheezes. He has no rhonchi. He has no rales.  Abdominal: Soft. Normal appearance and bowel sounds are normal. There is no hepatosplenomegaly. There is no tenderness. There is no rebound, no guarding and no CVA tenderness. No hernia.  Lymphadenopathy:    He has no cervical adenopathy.  Neurological: He is alert. He has normal strength and normal reflexes. No cranial nerve deficit or sensory deficit. Gait normal.  Skin: Skin is warm, dry and intact. No rash noted.  Psychiatric: He has a normal mood  and affect. His speech is normal and behavior is normal. Judgment normal.     Diabetic foot exam: Normal inspection No skin breakdown No calluses  Normal DP pulses Normal sensation to light touch and monofilament Nails normal      Assessment & Plan:  The patient's preventative maintenance and recommended screening tests for an annual wellness exam were reviewed in full today. Brought up to date unless services declined.  Counselled on the importance of diet, exercise, and its role in overall health and mortality. The patient's FH and SH was reviewed, including their home life, tobacco status, and drug and alcohol status.    Vaccines: refused flu.. uptodate with PNA, Tdap.. Consider shingles Prostate Cancer Screen:  Stable Lab Results  Component Value Date   PSA 0.42 12/12/2016   PSA 0.41 01/05/2016   PSA 0.39 01/11/2015  Colon Cancer Screen:       Smoking Status:no smoker ETOH/ drug use: beer 2 a day  Hep C: neg  HIV screen:   refused

## 2016-12-18 NOTE — Patient Instructions (Addendum)
Set up yearly eye exam.  Stop HCTZ.  Start lasix as needed for swelling. When taking lasix take a potassium tablet with it.  Make sure to keep cardiologist appointment for further eval.  Elevate, walk daily and consider compression hose.  Call if interested in cologuard testing.  Work on [employer] and regular exercise.  MAKE AN APPT ON WAY OUT TO SEE DR. COPLAND for right knee pain and likely steroid injection.

## 2016-12-18 NOTE — Assessment & Plan Note (Signed)
Needs follow up for progression with cardiology.

## 2017-04-21 NOTE — Progress Notes (Signed)
Cardiology Office Note   Date:  04/22/2017   ID:  Gregory, [name] [birth year], MRN [id number]  PCP:  Jinny Sanders, MD  Cardiologist:   [name], MD  Referring:  Jinny Sanders, MD  Chief Complaint  Patient presents with  . Edema      History of Present Illness: Gregory [name] is a 67 y.o. male who presents for follow up of aortic valve disease.  I saw him last in 2017.  She had moderate AI and AS.   He was supposed to return for 1 year follow-up but he did not schedule this and his wife made him come to an appointment finally.  He has had increasing lower extremity swelling for about the past year.  He was recently treated with diuretic without improvement.  His weights have been steady for about a year.  He is on his feet a lot at work and still works an 8-hour day in [employer].  He does get dyspnea climbing a flight of stairs but he can keep going.  Is not describing PND or orthopnea.  Is not having any palpitations, presyncope or syncope.  He has had no cough fevers or chills.  He has no chest pressure, neck or arm discomfort.  Past Medical History:  Diagnosis Date  . Aortic insufficiency   . Hypertension   . Shingles     Past Surgical History:  Procedure Laterality Date  . None       Current Outpatient Medications  Medication Sig Dispense Refill  . ALPRAZolam (XANAX) 0.5 MG tablet Take 1 tablet (0.5 mg total) by mouth daily as needed for anxiety (30 minprior to plane floght). 20 tablet 0  . amLODipine (NORVASC) 10 MG tablet Take 1 tablet (10 mg total) by mouth daily. 90 tablet 3  . atorvastatin (LIPITOR) 40 MG tablet Take 1 tablet (40 mg total) by mouth daily. 90 tablet 3  . furosemide (LASIX) 40 MG tablet Take 1 tablet (40 mg total) by mouth daily. 90 tablet 3  . hydrochlorothiazide (HYDRODIURIL) 25 MG tablet Take 1 tablet (25 mg total) by mouth daily. 90 tablet 1  . ibuprofen (ADVIL,MOTRIN) 200 MG tablet Take 800 mg by mouth 2 (two) times daily.      [name] lisinopril (PRINIVIL,ZESTRIL) 40 MG tablet Take 1 tablet (40 mg total) by mouth daily. 90 tablet 3  . potassium chloride SA (K-DUR,KLOR-CON) 20 MEQ tablet Take 1 tablet (20 mEq total) by mouth daily as needed (Take only when taking lasix). 30 tablet 3   No current facility-administered medications for this visit.     Allergies:   Patient has no known allergies.    ROS:  Please see the history of present illness.   Otherwise, review of systems are positive for knee pain.   All other systems are reviewed and negative.    PHYSICAL EXAM: VS:  BP (!) 144/65   Pulse (!) 53   Ht 5\' 8"  (1.727 m)   Wt 278 lb (126.1 kg)   BMI 42.27 kg/m  , BMI Body mass index is 42.27 kg/m.  GENERAL:  Well appearing NECK:  No jugular venous distention, waveform within normal limits, carotid upstroke brisk and symmetric, no bruits, no thyromegaly LUNGS:  Clear to auscultation bilaterally CHEST:  Unremarkable HEART:  PMI not displaced or sustained,S1 and S2 within normal limits, no S3, no S4, no clicks, no rubs, 3 of 6 apical systolic murmur radiating at the aortic outflow tract, diastolic murmur  ending in mid diastole ABD:  Flat, positive bowel sounds normal in frequency in pitch, no bruits, no rebound, no guarding, no midline pulsatile mass, no hepatomegaly, no splenomegaly, umbilical hernia, obese EXT:  2 plus pulses throughout, moderate edema to knees with chronic venous stasis changes, no cyanosis no clubbing    EKG:  EKG  ordered today. The ekg ordered 04/22/17 demonstrates sinus rhythm, rate 53, axis within normal limits, intervals within normal limits, no acute ST-T wave changes.   Recent Labs: 12/12/2016: ALT 12; BUN 20; Creatinine, Ser 1.03; Hemoglobin 13.3; Platelets 204.0; Potassium 4.4; Sodium 140    Lipid Panel    Component Value Date/Time   CHOL 156 12/12/2016 1129   TRIG 197.0 (H) 12/12/2016 1129   TRIG 162 (H) 10/25/2005 1507   HDL 57.90 12/12/2016 1129   CHOLHDL 3 12/12/2016 1129    VLDL 39.4 12/12/2016 1129   LDLCALC 58 12/12/2016 1129   LDLDIRECT 151.0 01/05/2016 0748      Wt Readings from Last 3 Encounters:  04/22/17 278 lb (126.1 kg)  12/18/16 276 lb 8 oz (125.4 kg)  10/30/16 275 lb (124.7 kg)      Other studies Reviewed: Additional studies/ records that were reviewed today include: labs. Review of the above records demonstrates:     ASSESSMENT AND PLAN:  AS/AI:   She had moderate stenosis and regurgitation in 2017.  He is overdue for follow-up.  At this point I will start with an echocardiogram.  HTN: Blood pressure is slightly elevated but he says it is not high at home.  He can keep a blood pressure diary.   OBESITY:   We talked about weight as a contributing factor.  He drinks too much beer and we talked about reducing this.    EDEMA: This is probably multifactorial and somewhat related to his weight.  I am going to increase his Lasix to 40 mg daily.  We talked about fluid restriction.  He is going to get compression socks.  I will check the echocardiogram and then further evaluation will be based on these results.  Current medicines are reviewed at length with the patient today.  The patient does not have concerns regarding medicines.  The following changes have been made:  None  Labs/ tests ordered today include:   Orders Placed This Encounter  Procedures  . EKG 12-Lead  . ECHOCARDIOGRAM COMPLETE     Disposition:   FU with me after the echo.     Signed, [name], MD  04/22/2017 11:40 AM    [place]

## 2017-04-22 ENCOUNTER — Ambulatory Visit (INDEPENDENT_AMBULATORY_CARE_PROVIDER_SITE_OTHER): Payer: Medicare Other | Admitting: Cardiology

## 2017-04-22 ENCOUNTER — Encounter: Payer: Self-pay | Admitting: Cardiology

## 2017-04-22 VITALS — BP 144/65 | HR 53 | Ht 68.0 in | Wt 278.0 lb

## 2017-04-22 DIAGNOSIS — I351 Nonrheumatic aortic (valve) insufficiency: Secondary | ICD-10-CM

## 2017-04-22 DIAGNOSIS — M7989 Other specified soft tissue disorders: Secondary | ICD-10-CM

## 2017-04-22 DIAGNOSIS — I10 Essential (primary) hypertension: Secondary | ICD-10-CM | POA: Diagnosis not present

## 2017-04-22 MED ORDER — FUROSEMIDE 40 MG PO TABS: 40.0000 mg | ORAL_TABLET | Freq: Every day | ORAL | 3 refills | Status: DC

## 2017-04-22 NOTE — Patient Instructions (Signed)
Medication Instructions:  INCREASE- Furosemide(Lasix) 40 mg daily  If you need a refill on your cardiac medications before your next appointment, please call your pharmacy.  Labwork: None Ordered   Testing/Procedures: Your physician has requested that you have an echocardiogram. Echocardiography is a painless test that uses sound waves to create images of your heart. It provides your doctor with information about the size and shape of your heart and how well your heart's chambers and valves are working. This procedure takes approximately one hour. There are no restrictions for this procedure.  Follow-Up: Your physician wants you to follow-up in: 1 Month.     Thank you for choosing [facility] at [facility]!!

## 2017-04-29 ENCOUNTER — Other Ambulatory Visit: Payer: Self-pay

## 2017-04-29 ENCOUNTER — Ambulatory Visit (HOSPITAL_COMMUNITY): Payer: Medicare Other | Attending: Internal Medicine

## 2017-04-29 DIAGNOSIS — I351 Nonrheumatic aortic (valve) insufficiency: Secondary | ICD-10-CM | POA: Diagnosis not present

## 2017-04-29 DIAGNOSIS — I080 Rheumatic disorders of both mitral and aortic valves: Secondary | ICD-10-CM | POA: Insufficient documentation

## 2017-04-29 DIAGNOSIS — I10 Essential (primary) hypertension: Secondary | ICD-10-CM | POA: Insufficient documentation

## 2017-05-01 ENCOUNTER — Telehealth: Payer: Self-pay | Admitting: Cardiology

## 2017-05-01 NOTE — Telephone Encounter (Signed)
Left message to call back  

## 2017-05-01 NOTE — Telephone Encounter (Signed)
New message   Patient's spouse order calling to discuss and request order for home health services. Please call

## 2017-05-01 NOTE — Telephone Encounter (Signed)
Spoke with Pt's wife who reports her husband was instructed to wear compression hose. She verbalize that she has a bad back and its very difficult for her and her husband to put/remove them. Pt requesting to see if home health services would be available to provide assistance. Routed to Dr. Warren Lacy and primary nurse.

## 2017-05-01 NOTE — Telephone Encounter (Signed)
I am sure that home health would not be able to come out daily and apply compression stockings.

## 2017-05-03 NOTE — Telephone Encounter (Signed)
Spoke with pt he stated he is taking care of the situation, and voice thanks for calling

## 2017-05-08 ENCOUNTER — Other Ambulatory Visit: Payer: Self-pay | Admitting: Family Medicine

## 2017-05-14 ENCOUNTER — Encounter: Payer: Self-pay | Admitting: Cardiology

## 2017-05-20 ENCOUNTER — Other Ambulatory Visit: Payer: Self-pay | Admitting: Family Medicine

## 2017-06-02 NOTE — Progress Notes (Signed)
Cardiology Office Note   Date:  06/03/2017   ID:  Gregory [name] 28-May-1950, MRN [id number]  PCP:  Jinny Sanders, MD  Cardiologist:   [name], MD  Referring:  Jinny Sanders, MD  Chief Complaint  Patient presents with  . Shortness of Breath      History of Present Illness: Gregory [name] is a 67 y.o. male who presents for follow up of aortic valve disease.  I saw him last in 2017.  She had moderate AI and AS.   He was supposed to return for 1 year follow-up but he did not schedule this and his wife made him come to an appointment finally.  He has had increasing lower extremity swelling for about the past year.  I sent him for an echo and had moderately severe AS and severe AI with an EF of 55 - 60%.  LV size was normal.    Since that time he has had no new symptoms.  He will get dyspneic walking across level ground but this is chronic.  He previously reported that this was shortness of breath walking up the stairs but it might be more than this.  He does not have PND or orthopnea.  He works outside in [employer] The patient denies any new symptoms such as chest discomfort, neck or arm discomfort.  There have been no reported palpitations, presyncope or syncope.    Past Medical History:  Diagnosis Date  . Aortic insufficiency   . Hypertension   . Shingles     Past Surgical History:  Procedure Laterality Date  . None       Current Outpatient Medications  Medication Sig Dispense Refill  . ALPRAZolam (XANAX) 0.5 MG tablet Take 1 tablet (0.5 mg total) by mouth daily as needed for anxiety (30 minprior to plane floght). 20 tablet 0  . amLODipine (NORVASC) 10 MG tablet Take 1 tablet (10 mg total) by mouth daily. 90 tablet 3  . atorvastatin (LIPITOR) 40 MG tablet Take 1 tablet (40 mg total) by mouth daily. 90 tablet 3  . furosemide (LASIX) 40 MG tablet Take 1 tablet (40 mg total) by mouth daily. 90 tablet 3  . hydrochlorothiazide (HYDRODIURIL) 25 MG tablet Take  1 tablet (25 mg total) by mouth daily. 90 tablet 1  . ibuprofen (ADVIL,MOTRIN) 200 MG tablet Take 800 mg by mouth 2 (two) times daily.    [name] KLOR-CON M20 20 MEQ tablet TAKE 1 TABLET BY MOUTH ONCE DAILY AS NEEDED (TAKE  ONLY  WHEN  TAKING  LASIX) 90 tablet 1  . lisinopril (PRINIVIL,ZESTRIL) 40 MG tablet Take 1 tablet (40 mg total) by mouth daily. 90 tablet 3   No current facility-administered medications for this visit.     Allergies:   Patient has no known allergies.    ROS:  Please see the history of present illness.   Otherwise, review of systems are positive none.   All other systems are reviewed and negative.    PHYSICAL EXAM: VS:  BP 139/70   Pulse 60   Ht 5\' 7"  (1.702 m)   Wt 280 lb 3.2 oz (127.1 kg)   BMI 43.89 kg/m  , BMI Body mass index is 43.89 kg/m.  GENERAL:  Well appearing NECK:  No jugular venous distention, waveform within normal limits, carotid upstroke brisk and symmetric, no bruits, no thyromegaly LUNGS:  Clear to auscultation bilaterally CHEST:  Unremarkable HEART:  PMI not displaced or sustained,S1 and S2  within normal limits, no S3, no S4, no clicks, no rubs, 3 out of 6 apical systolic murmur radiating at the aortic outflow tract, diastolic murmur ending in mid diastole.  ABD:  Flat, positive bowel sounds normal in frequency in pitch, no bruits, no rebound, no guarding, no midline pulsatile mass, no hepatomegaly, no splenomegaly EXT:  2 plus pulses throughout, weeping leg edema, no cyanosis no clubbing    EKG:  EKG  ordered today. The ekg ordered 04/22/17 demonstrates sinus rhythm, rate 60, axis within normal limits, intervals within normal limits, no acute ST-T wave changes.   Recent Labs: 12/12/2016: ALT 12; BUN 20; Creatinine, Ser 1.03; Hemoglobin 13.3; Platelets 204.0; Potassium 4.4; Sodium 140    Lipid Panel    Component Value Date/Time   CHOL 156 12/12/2016 1129   TRIG 197.0 (H) 12/12/2016 1129   TRIG 162 (H) 10/25/2005 1507   HDL 57.90  12/12/2016 1129   CHOLHDL 3 12/12/2016 1129   VLDL 39.4 12/12/2016 1129   LDLCALC 58 12/12/2016 1129   LDLDIRECT 151.0 01/05/2016 0748      Wt Readings from Last 3 Encounters:  06/03/17 280 lb 3.2 oz (127.1 kg)  04/22/17 278 lb (126.1 kg)  12/18/16 276 lb 8 oz (125.4 kg)      Other studies Reviewed: Additional studies/ records that were reviewed today include:  Echocardiogram personally reviewed for this visit. Review of the above records demonstrates:  See elsewhere.    ASSESSMENT AND PLAN:  AS/AI:    He has moderately severe AS and severe AI.    We reviewed this at length.  This has progressed.  He still however has a mean gradient of only 25.  His left ventricular size and function is not at the point suggesting need for valve replacement.  It is very hard to judge symptoms as he is obese and this certainly contributes to dyspnea.  At this point and we will follow this clinically and check him again with an echocardiogram in 6 months.  I think he has he does know that I think he is progressing to the need for valve replacement perhaps in the nextC1 valvular disease.  He knows that I think that he is progressing to the need for valve replacement in the next 6 to 18 months and he needs to have modified lifestyle changes.  HTN: Blood pressure is at target.  I will continue the meds as listed.   OBESITY:    We had another long discussion about this.  We talked about diet and exercise.    EDEMA:   At the last visit I increased his lasix.  However, I do not go up on this further as he works outside in the hot sun.  He is wearing his compression stockings.  I do not think this is related to right-sided heart failure as this is not evident on his echo.  He needs to drastically have lifestyle changes as we discussed and in particular salt restriction.    Current medicines are reviewed at length with the patient today.  The patient does not have concerns regarding medicines.  The following  changes have been made:  None  Labs/ tests ordered today include:   Orders Placed This Encounter  Procedures  . EKG 12-Lead  . ECHOCARDIOGRAM COMPLETE     Disposition:   FU with me in six months after the repeat echo. [name], MD  06/03/2017 9:30 AM    [facility]

## 2017-06-03 ENCOUNTER — Ambulatory Visit (INDEPENDENT_AMBULATORY_CARE_PROVIDER_SITE_OTHER): Payer: Medicare Other | Admitting: Cardiology

## 2017-06-03 ENCOUNTER — Encounter: Payer: Self-pay | Admitting: Cardiology

## 2017-06-03 VITALS — BP 139/70 | HR 60 | Ht 67.0 in | Wt 280.2 lb

## 2017-06-03 DIAGNOSIS — I350 Nonrheumatic aortic (valve) stenosis: Secondary | ICD-10-CM

## 2017-06-03 DIAGNOSIS — M7989 Other specified soft tissue disorders: Secondary | ICD-10-CM | POA: Diagnosis not present

## 2017-06-03 DIAGNOSIS — I351 Nonrheumatic aortic (valve) insufficiency: Secondary | ICD-10-CM

## 2017-06-03 NOTE — Patient Instructions (Addendum)
Medication Instructions:  Continue current medications  If you need a refill on your cardiac medications before your next appointment, please call your pharmacy.  Labwork: None Ordered    Testing/Procedures: Your physician has requested that you have an echocardiogram in 6 Months. Echocardiography is a painless test that uses sound waves to create images of your heart. It provides your doctor with information about the size and shape of your heart and how well your heart's chambers and valves are working. This procedure takes approximately one hour. There are no restrictions for this procedure.  Follow-Up: Your physician wants you to follow-up in: 6 Months. You should receive a reminder letter in the mail two months in advance. If you do not receive a letter, please call our office [phone].    Thank you for choosing [facility] at [facility]!!       

## 2017-07-01 DIAGNOSIS — L600 Ingrowing nail: Secondary | ICD-10-CM | POA: Diagnosis not present

## 2017-07-01 DIAGNOSIS — M25775 Osteophyte, left foot: Secondary | ICD-10-CM | POA: Diagnosis not present

## 2017-07-01 DIAGNOSIS — M25774 Osteophyte, right foot: Secondary | ICD-10-CM | POA: Diagnosis not present

## 2017-10-16 LAB — HM DIABETES EYE EXAM

## 2017-11-11 DIAGNOSIS — H25812 Combined forms of age-related cataract, left eye: Secondary | ICD-10-CM | POA: Diagnosis not present

## 2017-11-11 DIAGNOSIS — H25811 Combined forms of age-related cataract, right eye: Secondary | ICD-10-CM | POA: Diagnosis not present

## 2017-11-11 DIAGNOSIS — H40003 Preglaucoma, unspecified, bilateral: Secondary | ICD-10-CM | POA: Diagnosis not present

## 2017-11-11 DIAGNOSIS — H02831 Dermatochalasis of right upper eyelid: Secondary | ICD-10-CM | POA: Diagnosis not present

## 2017-11-14 ENCOUNTER — Telehealth: Payer: Self-pay

## 2017-11-14 NOTE — Telephone Encounter (Signed)
Noted  

## 2017-11-14 NOTE — Telephone Encounter (Signed)
FYI:  Spoke with patient about scheduling his Annual Wellness visit-physical with Dr Diona Browner. Patient declined getting this scheduled at this time and advised patient we will follow up in January to try and schedule again.

## 2017-11-21 ENCOUNTER — Other Ambulatory Visit: Payer: Self-pay | Admitting: Family Medicine

## 2017-12-03 ENCOUNTER — Other Ambulatory Visit: Payer: Self-pay

## 2017-12-03 ENCOUNTER — Ambulatory Visit (HOSPITAL_COMMUNITY): Payer: Medicare Other | Attending: Cardiology

## 2017-12-03 DIAGNOSIS — I350 Nonrheumatic aortic (valve) stenosis: Secondary | ICD-10-CM | POA: Diagnosis not present

## 2017-12-03 DIAGNOSIS — I351 Nonrheumatic aortic (valve) insufficiency: Secondary | ICD-10-CM | POA: Diagnosis not present

## 2017-12-05 DIAGNOSIS — H2511 Age-related nuclear cataract, right eye: Secondary | ICD-10-CM | POA: Diagnosis not present

## 2017-12-05 DIAGNOSIS — H25811 Combined forms of age-related cataract, right eye: Secondary | ICD-10-CM | POA: Diagnosis not present

## 2017-12-30 ENCOUNTER — Other Ambulatory Visit: Payer: Self-pay | Admitting: Family Medicine

## 2017-12-30 NOTE — Telephone Encounter (Signed)
Last office visit 12/18/2016.  Last refilled 11/21/2017 for #30 with no refills.  Rx states needs office visit.  Patient was contacted on 11/14/2017 for schedule MWV which patient declined at that time.  No future appointments.  Refill?

## 2018-01-09 ENCOUNTER — Ambulatory Visit (INDEPENDENT_AMBULATORY_CARE_PROVIDER_SITE_OTHER): Payer: Medicare HMO | Admitting: Family Medicine

## 2018-01-09 ENCOUNTER — Encounter: Payer: Self-pay | Admitting: Family Medicine

## 2018-01-09 VITALS — BP 120/60 | HR 59 | Temp 98.3°F | Ht 68.0 in | Wt 292.2 lb

## 2018-01-09 DIAGNOSIS — I10 Essential (primary) hypertension: Secondary | ICD-10-CM | POA: Diagnosis not present

## 2018-01-09 DIAGNOSIS — R609 Edema, unspecified: Secondary | ICD-10-CM | POA: Diagnosis not present

## 2018-01-09 DIAGNOSIS — E7800 Pure hypercholesterolemia, unspecified: Secondary | ICD-10-CM

## 2018-01-09 DIAGNOSIS — I519 Heart disease, unspecified: Secondary | ICD-10-CM

## 2018-01-09 DIAGNOSIS — E1169 Type 2 diabetes mellitus with other specified complication: Secondary | ICD-10-CM | POA: Diagnosis not present

## 2018-01-09 DIAGNOSIS — E669 Obesity, unspecified: Secondary | ICD-10-CM | POA: Diagnosis not present

## 2018-01-09 DIAGNOSIS — I5189 Other ill-defined heart diseases: Secondary | ICD-10-CM | POA: Insufficient documentation

## 2018-01-09 DIAGNOSIS — L98491 Non-pressure chronic ulcer of skin of other sites limited to breakdown of skin: Secondary | ICD-10-CM

## 2018-01-09 LAB — HM DIABETES FOOT EXAM

## 2018-01-09 MED ORDER — POTASSIUM CHLORIDE CRYS ER 20 MEQ PO TBCR: 20.0000 meq | EXTENDED_RELEASE_TABLET | Freq: Every day | ORAL | 3 refills | Status: DC

## 2018-01-09 MED ORDER — ALPRAZOLAM 0.5 MG PO TABS: 0.5000 mg | ORAL_TABLET | Freq: Every day | ORAL | 0 refills | Status: DC | PRN

## 2018-01-09 MED ORDER — FUROSEMIDE 40 MG PO TABS: 40.0000 mg | ORAL_TABLET | Freq: Every day | ORAL | 3 refills | Status: DC

## 2018-01-09 NOTE — Assessment & Plan Note (Signed)
Due for re-eval. Encouraged exercise, weight loss, healthy eating habits. Referred to nutritionist.

## 2018-01-09 NOTE — Assessment & Plan Note (Signed)
Due for re-eval. Low chol diet.

## 2018-01-09 NOTE — Assessment & Plan Note (Signed)
Well controlled. Continue current medication.  

## 2018-01-09 NOTE — Assessment & Plan Note (Signed)
Referred to wound care center.  No clear infection.  Poor healing given severe edema and diabetes.   Elevate legs, compression hose and will increase lasix to 2 tabs 40 mg daily x 3 days.

## 2018-01-09 NOTE — Progress Notes (Signed)
Subjective:    Patient ID: Gregory [name], male    DOB: [birth year], 68 y.o.   MRN: [id number]  HPI   68 year old male presents for a medication refill and is overdue for follow up.  Diabetes:  Due for A1C  On no meds. Lab Results  Component Value Date   HGBA1C 5.9 12/12/2016  Using medications without difficulties: Hypoglycemic episodes: Hyperglycemic episodes: Feet problems:  Has sores on boths Blood Sugars averaging: not  checking eye exam within last year: yes  Hypertension:   Good control of BP on lisinopril, HCTZ, amlodipine Using medication without problems or lightheadedness:  none Chest pain with exertion:none Edema:  yes Short of breath: none Average home BPs: Other issues: fatigued  Due for re-check of cholesterol on atorvastatin 40 mg daily.   Lesion  on  Right lower legs.. nonhealing.  Clear liquids weeping. Mild odor.  Itching, not painful. Using pads on it, cleaning with hydrogen peroxide and applying neosporin. No fever.  No numbness in feet. Wears compression hoses.  Started after area rubbed on compression hose stand.   He has appt with cardiology with [facility].  recent ECHo stable AI and AS, nml EF, grade 1 diastolic dys  On lasix and potassium for swelling.   Using alprazolam for situation anxiety. Social History /Family History/Past Medical History reviewed in detail and updated in EMR if needed. Blood pressure 120/60, pulse (!) 59, temperature 98.3 F (36.8 C), temperature source Oral, height 5\' 8"  (1.727 m), weight 292 lb 4 oz (132.6 kg), SpO2 90 %.  Review of Systems  Constitutional: Positive for fatigue. Negative for fever.  HENT: Negative for ear pain.   Eyes: Negative for pain.  Respiratory: Negative for cough and shortness of breath.   Cardiovascular: Positive for leg swelling. Negative for chest pain and palpitations.  Gastrointestinal: Negative for abdominal pain.  Genitourinary: Negative for dysuria.  Musculoskeletal: Negative  for arthralgias.  Neurological: Negative for syncope, light-headedness and headaches.  Psychiatric/Behavioral: Negative for dysphoric mood.       Objective:   Physical Exam Constitutional:      Appearance: He is well-developed. He is obese.  HENT:     Head: Normocephalic.     Right Ear: Hearing normal.     Left Ear: Hearing normal.     Nose: Nose normal.  Neck:     Thyroid: No thyroid mass or thyromegaly.     Vascular: No carotid bruit.     Trachea: Trachea normal.  Cardiovascular:     Rate and Rhythm: Normal rate and regular rhythm.     Pulses: Normal pulses.     Heart sounds: Heart sounds not distant. No murmur. No friction rub. No gallop.      Comments: No peripheral edema Pulmonary:     Effort: Pulmonary effort is normal. No respiratory distress.     Breath sounds: Normal breath sounds.  Musculoskeletal:     Right lower leg: 2+ Pitting Edema present.     Left lower leg: 2+ Pitting Edema present.  Skin:    General: Skin is warm and dry.     Findings: Wound present. No rash.          Comments:  Large are of shallow Ulceration and weeping across back of right calf.  Psychiatric:        Speech: Speech normal.        Behavior: Behavior normal.        Thought Content: Thought content normal.  Assessment & Plan:

## 2018-01-09 NOTE — Assessment & Plan Note (Signed)
Noted on 2019 ECHO... likely contributing to peripheral edema.

## 2018-01-09 NOTE — Patient Instructions (Addendum)
Our office will call you to set up wound care and nutrition referral. Return for fasting labs  in next few days.  Stop HCTZ.  Increase lasix x 2 tabs daily until follow up with cardiology.  Elevate feet above heart   Continue compression hose.

## 2018-01-11 NOTE — Progress Notes (Signed)
Cardiology Office Note   Date:  01/13/2018   ID:  Ebon, Ketchum [birth year], MRN [id number]  PCP:  Jinny Sanders, MD  Cardiologist:   [name], MD  Referring:  Jinny Sanders, MD   Chief Complaint  Patient presents with  . Edema      History of Present Illness: Gregory [name] is a 68 y.o. male who presents for follow up of aortic valve disease.  He had a follow up echo in Dec and had moderate AS and AI.  This was unchanged from previous or maybe appeared to be slightly less.  He has had no new shortness of breath, PND or orthopnea.  His problem is joint pain.  This limits his ability to do his job which is in [employer].  He has to climb stairs.  He gets dyspneic walking up the stairs but this is not changed.  He is not describing resting shortness of breath, PND or orthopnea.  He has had increased lower extremity swelling and has some nonhealing ulcers about the see the wound clinic.  He has been given some increased diuretic in the last couple of days for this.  He unfortunately has not been able lose weight with diet and exercise   Past Medical History:  Diagnosis Date  . Aortic insufficiency   . Hypertension   . Shingles     Past Surgical History:  Procedure Laterality Date  . None       Current Outpatient Medications  Medication Sig Dispense Refill  . ALPRAZolam (XANAX) 0.5 MG tablet Take 1 tablet (0.5 mg total) by mouth daily as needed for anxiety (30 minprior to plane floght). 20 tablet 0  . amLODipine (NORVASC) 10 MG tablet Take 1 tablet (10 mg total) by mouth daily. 90 tablet 3  . atorvastatin (LIPITOR) 40 MG tablet Take 1 tablet (40 mg total) by mouth daily. 90 tablet 3  . furosemide (LASIX) 40 MG tablet Take 1 tablet (40 mg total) by mouth daily. 90 tablet 3  . ibuprofen (ADVIL,MOTRIN) 200 MG tablet Take 800 mg by mouth 2 (two) times daily.    [name] lisinopril (PRINIVIL,ZESTRIL) 40 MG tablet Take 1 tablet (40 mg total) by mouth daily. 90 tablet  3  . potassium chloride SA (K-DUR,KLOR-CON) 20 MEQ tablet Take 1 tablet (20 mEq total) by mouth daily. 90 tablet 3   No current facility-administered medications for this visit.     Allergies:   Patient has no known allergies.    ROS:  Please see the history of present illness.   Otherwise, review of systems are positive knee pain.   All other systems are reviewed and negative.    PHYSICAL EXAM: VS:  BP 120/68   Pulse 60   Ht 5\' 7"  (1.702 m)   Wt 291 lb (132 kg)   SpO2 94%   BMI 45.58 kg/m  , BMI Body mass index is 45.58 kg/m.  GENERAL:  Well appearing NECK:  No jugular venous distention, waveform within normal limits, carotid upstroke brisk and symmetric, no bruits, no thyromegaly LUNGS:  Clear to auscultation bilaterally CHEST:  Unremarkable HEART:  PMI not displaced or sustained,S1 and S2 within normal limits, no S3, no S4, no clicks, no rubs, 3 out of 6 apical systolic murmur radiating out the aortic outflow tract, diastolic murmur ending in mid diastole, ABD:  Flat, positive bowel sounds normal in frequency in pitch, no bruits, no rebound, no guarding, no midline pulsatile mass, no  hepatomegaly, no splenomegaly EXT:  2 plus pulses throughout, moderate calf edema edema, no cyanosis no clubbing, bandaged venous stasis ulcers   EKG:  EKG not ordered today.   Recent Labs: No results found for requested labs within last 8760 hours.    Lipid Panel    Component Value Date/Time   CHOL 156 12/12/2016 1129   TRIG 197.0 (H) 12/12/2016 1129   TRIG 162 (H) 10/25/2005 1507   HDL 57.90 12/12/2016 1129   CHOLHDL 3 12/12/2016 1129   VLDL 39.4 12/12/2016 1129   LDLCALC 58 12/12/2016 1129   LDLDIRECT 151.0 01/05/2016 0748      Wt Readings from Last 3 Encounters:  01/13/18 291 lb (132 kg)  01/09/18 292 lb 4 oz (132.6 kg)  06/03/17 280 lb 3.2 oz (127.1 kg)      Other studies Reviewed: Additional studies/ records that were reviewed today include: Echo. Review of the above  records demonstrates: See below   ASSESSMENT AND PLAN:  AS/AI:    These were both moderate at the recent echo.  At this point he does not seem to have overt symptoms necessarily related to this.  He is got normal LV size and function.  I am going to keep a close eye on this and see him again in 6 months.  I will consider repeating the echo at that time.  In the meantime we talked a lot about weight loss as below.   HTN: Blood pressure is at target.  No change in therapy.   OBESITY:    I am going to refer him to the [facility].  EDEMA:    This is probably more venous insufficiency issue also tied to some morbid obesity and sleep apnea.   He is going to treat this with as needed diuretics.  He is wearing compression stockings.  He needs salt restriction.  I will consider sleep apnea evaluation in the future.  I think the most important issue is weight loss.  He will follow with the wound clinic.   Current medicines are reviewed at length with the patient today.  The patient does not have concerns regarding medicines.  The following changes have been made:  None  Labs/ tests ordered today include:   Orders Placed This Encounter  Procedures  . Amb Ref to [facility]     Disposition:   FU with me in six months.    Signed, [name], MD  01/13/2018 12:34 PM    [facility]

## 2018-01-13 ENCOUNTER — Ambulatory Visit: Payer: Medicare HMO | Admitting: Cardiology

## 2018-01-13 ENCOUNTER — Encounter: Payer: Self-pay | Admitting: Cardiology

## 2018-01-13 ENCOUNTER — Other Ambulatory Visit: Payer: Self-pay | Admitting: Family Medicine

## 2018-01-13 DIAGNOSIS — I10 Essential (primary) hypertension: Secondary | ICD-10-CM | POA: Diagnosis not present

## 2018-01-13 DIAGNOSIS — I351 Nonrheumatic aortic (valve) insufficiency: Secondary | ICD-10-CM | POA: Diagnosis not present

## 2018-01-13 DIAGNOSIS — I890 Lymphedema, not elsewhere classified: Secondary | ICD-10-CM | POA: Insufficient documentation

## 2018-01-13 DIAGNOSIS — I350 Nonrheumatic aortic (valve) stenosis: Secondary | ICD-10-CM | POA: Diagnosis not present

## 2018-01-13 DIAGNOSIS — M7989 Other specified soft tissue disorders: Secondary | ICD-10-CM

## 2018-01-13 NOTE — Patient Instructions (Addendum)
Medication Instructions:  Continue current medications  If you need a refill on your cardiac medications before your next appointment, please call your pharmacy.  Labwork: None Ordered   Take the provided lab slips with you to the lab for your blood draw.  When you have your labs (blood work) drawn today and your tests are completely normal, you will receive your results only by MyChart Message (if you have MyChart) -OR-  A paper copy in the mail.  If you have any lab test that is abnormal or we need to change your treatment, we will call you to review these results.  Testing/Procedures: None Ordered  Follow-Up: You have been referred to [facility] [phone]  You will need a follow up appointment in 6 months.  Please call our office 2 months in advance to schedule this appointment.  You may see Dr Percival Spanish or one of the following Advanced Practice Providers on your designated Care Team:   Rosaria Ferries, PA-C . Jory Sims, DNP, ANP    At [facility], you and your health needs are our priority.  As part of our continuing mission to provide you with exceptional heart care, we have created designated Provider Care Teams.  These Care Teams include your primary Cardiologist (physician) and Advanced Practice Providers (APPs -  Physician Assistants and Nurse Practitioners) who all work together to provide you with the care you need, when you need it.  Thank you for choosing [facility] at [facility]!!

## 2018-01-14 ENCOUNTER — Other Ambulatory Visit (INDEPENDENT_AMBULATORY_CARE_PROVIDER_SITE_OTHER): Payer: Medicare HMO

## 2018-01-14 ENCOUNTER — Encounter: Payer: Self-pay | Admitting: *Deleted

## 2018-01-14 DIAGNOSIS — E7800 Pure hypercholesterolemia, unspecified: Secondary | ICD-10-CM

## 2018-01-14 DIAGNOSIS — E1169 Type 2 diabetes mellitus with other specified complication: Secondary | ICD-10-CM

## 2018-01-14 DIAGNOSIS — E669 Obesity, unspecified: Secondary | ICD-10-CM

## 2018-01-14 LAB — COMPREHENSIVE METABOLIC PANEL
ALT: 13 U/L (ref 0–53)
AST: 15 U/L (ref 0–37)
Albumin: 4 g/dL (ref 3.5–5.2)
Alkaline Phosphatase: 58 U/L (ref 39–117)
BUN: 16 mg/dL (ref 6–23)
CO2: 30 mEq/L (ref 19–32)
Calcium: 8.9 mg/dL (ref 8.4–10.5)
Chloride: 100 mEq/L (ref 96–112)
Creatinine, Ser: 0.8 mg/dL (ref 0.40–1.50)
GFR: 101.76 mL/min (ref >60.00)
Glucose, Bld: 99 mg/dL (ref 70–99)
Potassium: 4.3 mEq/L (ref 3.5–5.1)
Sodium: 138 mEq/L (ref 135–145)
Total Bilirubin: 0.6 mg/dL (ref 0.2–1.2)
Total Protein: 6.9 g/dL (ref 6.0–8.3)

## 2018-01-14 LAB — LIPID PANEL
Cholesterol: 156 mg/dL (ref 0–200)
HDL: 56.2 mg/dL (ref >39.00)
LDL Cholesterol: 74 mg/dL (ref 0–99)
NonHDL: 99.69
Total CHOL/HDL Ratio: 3
Triglycerides: 129 mg/dL (ref 0.0–149.0)
VLDL: 25.8 mg/dL (ref 0.0–40.0)

## 2018-01-14 LAB — HEMOGLOBIN A1C: Hgb A1c MFr Bld: 5.8 % (ref 4.6–6.5)

## 2018-01-17 ENCOUNTER — Encounter: Payer: Medicare HMO | Attending: Physician Assistant | Admitting: Physician Assistant

## 2018-01-17 DIAGNOSIS — I10 Essential (primary) hypertension: Secondary | ICD-10-CM | POA: Insufficient documentation

## 2018-01-17 DIAGNOSIS — L97812 Non-pressure chronic ulcer of other part of right lower leg with fat layer exposed: Secondary | ICD-10-CM | POA: Diagnosis not present

## 2018-01-17 DIAGNOSIS — L97822 Non-pressure chronic ulcer of other part of left lower leg with fat layer exposed: Secondary | ICD-10-CM | POA: Diagnosis not present

## 2018-01-17 DIAGNOSIS — E11622 Type 2 diabetes mellitus with other skin ulcer: Secondary | ICD-10-CM | POA: Insufficient documentation

## 2018-01-17 DIAGNOSIS — H40013 Open angle with borderline findings, low risk, bilateral: Secondary | ICD-10-CM | POA: Diagnosis not present

## 2018-01-17 DIAGNOSIS — I83028 Varicose veins of left lower extremity with ulcer other part of lower leg: Secondary | ICD-10-CM | POA: Insufficient documentation

## 2018-01-17 DIAGNOSIS — I83018 Varicose veins of right lower extremity with ulcer other part of lower leg: Secondary | ICD-10-CM | POA: Insufficient documentation

## 2018-01-17 DIAGNOSIS — I890 Lymphedema, not elsewhere classified: Secondary | ICD-10-CM | POA: Insufficient documentation

## 2018-01-17 DIAGNOSIS — L03115 Cellulitis of right lower limb: Secondary | ICD-10-CM | POA: Insufficient documentation

## 2018-01-17 DIAGNOSIS — I872 Venous insufficiency (chronic) (peripheral): Secondary | ICD-10-CM | POA: Diagnosis not present

## 2018-01-19 NOTE — Progress Notes (Signed)
Gregory [name] ([id number]) Visit Report for 01/17/2018 Abuse/Suicide Risk Screen Details Patient Name: Gregory [name] Date of Service: 01/17/2018 2:30 PM Medical Record Number: [id number] Patient Account Number: [id number] Date of Birth/Sex: 1950/11/20 (68 y.o. M) Treating RN: Montey Hora Primary Care Ameen Mostafa: [name] Other Clinician: Referring Shian Goodnow: [name] Treating Alyjah Lovingood/Extender: Melburn Hake, HOYT Weeks in Treatment: 0 Abuse/Suicide Risk Screen Items Answer ABUSE/SUICIDE RISK SCREEN: Has anyone close to you tried to hurt or harm you recentlyo No Do you feel uncomfortable with anyone in your familyo No Has anyone forced you do things that you didnot want to doo No Do you have any thoughts of harming yourselfo No Patient displays signs or symptoms of abuse and/or neglect. No Electronic Signature(s) Signed: 01/17/2018 5:32:05 PM By: Montey Hora Entered By: Montey Hora on 01/17/2018 14:38:00 Gregory [name] ([id number]) -------------------------------------------------------------------------------- Activities of Daily Living Details Patient Name: [name], [name] Date of Service: 01/17/2018 2:30 PM Medical Record Number: [id number] Patient Account Number: [id number] Date of Birth/Sex: 06-Feb-1950 (68 y.o. M) Treating RN: Montey Hora Primary Care Ryder Man: [name] Other Clinician: Referring Linnie Mcglocklin: [name] Treating Diondra Pines/Extender: Melburn Hake, HOYT Weeks in Treatment: 0 Activities of Daily Living Items Answer Activities of Daily Living (Please select one for each item) Drive Automobile Completely Able Take Medications Completely Able Use Telephone Completely Able Care for Appearance Completely Able Use Toilet Completely Able Bath / Shower Completely Able Dress Self Completely Able Feed Self Completely Able Walk Completely Able Get In / Out Bed Completely Able Housework Completely Able Prepare Meals Completely [place] for Self Completely Able Electronic Signature(s) Signed: 01/17/2018 5:32:05 PM By: Montey Hora Entered By: Montey Hora on 01/17/2018 14:38:19 Gregory [name] ([id number]) -------------------------------------------------------------------------------- Education Assessment Details Patient Name: Gregory [name] Date of Service: 01/17/2018 2:30 PM Medical Record Number: [id number] Patient Account Number: [id number] Date of Birth/Sex: 12/12/1950 (68 y.o. M) Treating RN: Montey Hora Primary Care Xiamara Hulet: [name] Other Clinician: Referring Manda Holstad: [name] Treating Johnie Makki/Extender: Melburn Hake, HOYT Weeks in Treatment: 0 Primary Learner Assessed: Patient Learning Preferences/Education Level/Primary Language Learning Preference: Explanation, Demonstration Highest Education Level: College or Above Preferred Language: English Cognitive Barrier Assessment/Beliefs Language Barrier: No Translator Needed: No Memory Deficit: No Emotional Barrier: No Cultural/Religious Beliefs Affecting Medical Care: No Physical Barrier Assessment Impaired Vision: No Impaired Hearing: No Decreased Hand dexterity: No Knowledge/Comprehension Assessment Knowledge Level: Medium Comprehension Level: Medium Ability to understand written Medium instructions: Ability to understand verbal Medium instructions: Motivation Assessment Anxiety Level: Calm Cooperation: Cooperative Education Importance: Acknowledges Need Interest in Health Problems: Asks Questions Perception: Coherent Willingness to Engage in Self- Medium Management Activities: Readiness to Engage in Self- Medium Management Activities: Electronic Signature(s) Signed: 01/17/2018 5:32:05 PM By: Montey Hora Entered By: Montey Hora on 01/17/2018 14:38:48 Gregory [name] ([id number]) -------------------------------------------------------------------------------- Fall Risk Assessment  Details Patient Name: Gregory [name] Date of Service: 01/17/2018 2:30 PM Medical Record Number: [id number] Patient Account Number: [id number] Date of Birth/Sex: 12-Mar-1950 (68 y.o. M) Treating RN: Montey Hora Primary Care Seymore Brodowski: [name] Other Clinician: Referring Dorise Gangi: [name] Treating Sven Pinheiro/Extender: Melburn Hake, HOYT Weeks in Treatment: 0 Fall Risk Assessment Items Have you had 2 or more falls in the last 12 monthso 0 No Have you had any fall that resulted in injury in the last 12 monthso 0 No FALL RISK ASSESSMENT: History of falling - immediate or within 3 months 0 No Secondary diagnosis 0 No Ambulatory aid None/bed rest/wheelchair/nurse 0 Yes Crutches/cane/walker 0 No Furniture  0 No IV Access/Saline Lock 0 No Gait/Training Normal/bed rest/immobile 0 No Weak 10 Yes Impaired 0 No Mental Status Oriented to own ability 0 Yes Electronic Signature(s) Signed: 01/17/2018 5:32:05 PM By: Montey Hora Entered By: Montey Hora on 01/17/2018 14:40:10 Gregory [name] ([id number]) -------------------------------------------------------------------------------- Foot Assessment Details Patient Name: Gregory [name] Date of Service: 01/17/2018 2:30 PM Medical Record Number: [id number] Patient Account Number: [id number] Date of Birth/Sex: 29-Feb-1950 (67 y.o. M) Treating RN: Montey Hora Primary Care Brodie Correll: [name] Other Clinician: Referring Sequan Auxier: [name] Treating Tymarion Everard/Extender: Melburn Hake, HOYT Weeks in Treatment: 0 Foot Assessment Items Site Locations + = Sensation present, - = Sensation absent, C = Callus, U = Ulcer R = Redness, W = Warmth, M = Maceration, PU = Pre-ulcerative lesion F = Fissure, S = Swelling, D = Dryness Assessment Right: Left: Other Deformity: No No Prior Foot Ulcer: No No Prior Amputation: No No Charcot Joint: No No Ambulatory Status: Ambulatory Without Help Gait: Steady Electronic Signature(s) Signed:  01/17/2018 5:32:05 PM By: Montey Hora Entered By: Montey Hora on 01/17/2018 14:50:10 Gregory [name] ([id number]) -------------------------------------------------------------------------------- Nutrition Risk Assessment Details Patient Name: Gregory [name] Date of Service: 01/17/2018 2:30 PM Medical Record Number: [id number] Patient Account Number: [id number] Date of Birth/Sex: [birth year] (68 y.o. M) Treating RN: Montey Hora Primary Care Phuong [name]: [name] Other Clinician: Referring Bryley Chrisman: [name] Treating Alveda Vanhorne/Extender: Melburn Hake, HOYT Weeks in Treatment: 0 Height (in): 68 Weight (lbs): 285 Body Mass Index (BMI): 43.3 Nutrition Risk Assessment Items NUTRITION RISK SCREEN: I have an illness or condition that made me change the kind and/or amount of 0 No food I eat I eat fewer than two meals per day 0 No I eat few fruits and vegetables, or milk products 0 No I have three or more drinks of beer, liquor or wine almost every day 0 No I have tooth or mouth problems that make it hard for me to eat 0 No I don't always have enough money to buy the food I need 0 No I eat alone most of the time 0 No I take three or more different prescribed or over-the-counter drugs a day 1 Yes Without wanting to, I have lost or gained 10 pounds in the last six months 0 No I am not always physically able to shop, cook and/or feed myself 0 No Nutrition Protocols Good Risk Protocol 0 No interventions needed Moderate Risk Protocol Electronic Signature(s) Signed: 01/17/2018 5:32:05 PM By: Montey Hora Entered By: Montey Hora on 01/17/2018 14:40:16

## 2018-01-19 NOTE — Progress Notes (Addendum)
Gregory [name], Gregory [name] ([id number]) Visit Report for 01/17/2018 Allergy List Details Patient Name: Gregory [name] Date of Service: 01/17/2018 2:30 PM Medical Record Number: [id number] Patient Account Number: [id number] Date of Birth/Sex: 10-Nov-1950 (68 y.o. M) Treating RN: Secundino Ginger Primary Care Tomeika Weinmann: [name] Other Clinician: Referring Emberley Kral: [name] Treating Micheal Murad/Extender: STONE III, HOYT Weeks in Treatment: 0 Allergies Active Allergies No Known Allergies Allergy Notes Electronic Signature(s) Signed: 01/17/2018 2:50:02 PM By: Secundino Ginger Entered By: Secundino Ginger on 01/17/2018 14:36:01 Gregory [name] ([id number]) -------------------------------------------------------------------------------- Arrival Information Details Patient Name: Gregory [name] Date of Service: 01/17/2018 2:30 PM Medical Record Number: [id number] Patient Account Number: [id number] Date of Birth/Sex: November 13, 1950 (68 y.o. M) Treating RN: Secundino Ginger Primary Care Ernestene Coover: [name] Other Clinician: Referring Sintia Mckissic: [name] Treating Momina Hunton/Extender: Melburn Hake, HOYT Weeks in Treatment: 0 Visit Information Patient Arrived: Cane Arrival Time: 14:19 Accompanied By: wife Transfer Assistance: None Patient Identification Verified: Yes Secondary Verification Process Completed: Yes Electronic Signature(s) Signed: 01/17/2018 2:50:02 PM By: Secundino Ginger Entered By: Secundino Ginger on 01/17/2018 14:21:40 Gregory [name] ([id number]) -------------------------------------------------------------------------------- Clinic Level of Care Assessment Details Patient Name: Gregory [name] Date of Service: 01/17/2018 2:30 PM Medical Record Number: [id number] Patient Account Number: [id number] Date of Birth/Sex: 11-24-1950 (68 y.o. M) Treating RN: Harold Barban Primary Care Satara Virella: [name] Other Clinician: Referring Dennie Moltz: [name] Treating Elberta Lachapelle/Extender: Melburn Hake, HOYT Weeks  in Treatment: 0 Clinic Level of Care Assessment Items TOOL 2 Quantity Score []  - Use when only an EandM is performed on the INITIAL visit 0 ASSESSMENTS - Nursing Assessment / Reassessment X - General Physical Exam (combine w/ comprehensive assessment (listed just below) when 1 20 performed on new pt. evals) X- 1 25 Comprehensive Assessment (HX, ROS, Risk Assessments, Wounds Hx, etc.) ASSESSMENTS - Wound and Skin Assessment / Reassessment []  - Simple Wound Assessment / Reassessment - one wound 0 X- 2 5 Complex Wound Assessment / Reassessment - multiple wounds []  - 0 Dermatologic / Skin Assessment (not related to wound area) ASSESSMENTS - Ostomy and/or Continence Assessment and Care []  - Incontinence Assessment and Management 0 []  - 0 Ostomy Care Assessment and Management (repouching, etc.) PROCESS - Coordination of Care X - Simple Patient / Family Education for ongoing care 1 15 []  - 0 Complex (extensive) Patient / Family Education for ongoing care X- 1 10 Staff obtains Programmer, systems, Records, Test Results / Process Orders []  - 0 Staff telephones HHA, Nursing Homes / Clarify orders / etc []  - 0 Routine Transfer to another Facility (non-emergent condition) []  - 0 Routine Hospital Admission (non-emergent condition) X- 1 15 New Admissions / Biomedical engineer / Ordering NPWT, Apligraf, etc. []  - 0 Emergency Hospital Admission (emergent condition) X- 1 10 Simple Discharge Coordination []  - 0 Complex (extensive) Discharge Coordination PROCESS - Special Needs []  - Pediatric / Minor Patient Management 0 []  - 0 Isolation Patient Management Gregory [name], Gregory [name] ([id number]) []  - 0 Hearing / Language / Visual special needs []  - 0 Assessment of Community assistance (transportation, D/C planning, etc.) []  - 0 Additional assistance / Altered mentation []  - 0 Support Surface(s) Assessment (bed, cushion, seat, etc.) INTERVENTIONS - Wound Cleansing / Measurement X - Wound Imaging  (photographs - any number of wounds) 1 5 []  - 0 Wound Tracing (instead of photographs) []  - 0 Simple Wound Measurement - one wound X- 2 5 Complex Wound Measurement - multiple wounds []  - 0 Simple Wound Cleansing - one wound X- 2 5 Complex Wound Cleansing -  multiple wounds INTERVENTIONS - Wound Dressings []  - Small Wound Dressing one or multiple wounds 0 X- 2 15 Medium Wound Dressing one or multiple wounds []  - 0 Large Wound Dressing one or multiple wounds []  - 0 Application of Medications - injection INTERVENTIONS - Miscellaneous []  - External ear exam 0 []  - 0 Specimen Collection (cultures, biopsies, blood, body fluids, etc.) []  - 0 Specimen(s) / Culture(s) sent or taken to Lab for analysis []  - 0 Patient Transfer (multiple staff / Harrel Lemon Lift / Similar devices) []  - 0 Simple Staple / Suture removal (25 or less) []  - 0 Complex Staple / Suture removal (26 or more) []  - 0 Hypo / Hyperglycemic Management (close monitor of Blood Glucose) X- 1 15 Ankle / Brachial Index (ABI) - do not check if billed separately Has the patient been seen at the hospital within the last three years: Yes Total Score: 175 Level Of Care: New/Established - Level 5 Electronic Signature(s) Signed: 01/20/2018 5:13:49 PM By: Harold Barban Entered By: Harold Barban on 01/17/2018 15:23:38 Gregory [name] ([id number]) -------------------------------------------------------------------------------- Encounter Discharge Information Details Patient Name: Gregory [name] Date of Service: 01/17/2018 2:30 PM Medical Record Number: [id number] Patient Account Number: [id number] Date of Birth/Sex: [birth year] (68 y.o. M) Treating RN: Montey Hora Primary Care Ellysia Char: [name] Other Clinician: Referring Jomarie Gellis: [name] Treating Celedonio Sortino/Extender: Melburn Hake, HOYT Weeks in Treatment: 0 Encounter Discharge Information Items Post Procedure Vitals Discharge Condition: Stable Temperature  (F): 98.1 Ambulatory Status: Ambulatory Pulse (bpm): 60 Discharge Destination: Home Respiratory Rate (breaths/min): 18 Transportation: Private Auto Blood Pressure (mmHg): 120/60 Accompanied By: spouse Schedule Follow-up Appointment: Yes Clinical Summary of Care: Electronic Signature(s) Signed: 01/17/2018 5:32:05 PM By: Montey Hora Entered By: Montey Hora on 01/17/2018 16:25:03 Gregory [name] ([id number]) -------------------------------------------------------------------------------- Lower Extremity Assessment Details Patient Name: Gregory [name] Date of Service: 01/17/2018 2:30 PM Medical Record Number: [id number] Patient Account Number: [id number] Date of Birth/Sex: [birth year] (68 y.o. M) Treating RN: Montey Hora Primary Care Oliverio Cho: [name] Other Clinician: Referring Navneet Schmuck: [name] Treating Woodrow Dulski/Extender: Melburn Hake, HOYT Weeks in Treatment: 0 Edema Assessment Assessed: [Left: No] [Right: No] Edema: [Left: Yes] [Right: Yes] Calf Left: Right: Point of Measurement: 32 cm From Medial Instep 47 cm 46.7 cm Ankle Left: Right: Point of Measurement: 12 cm From Medial Instep 32.8 cm 31.5 cm Vascular Assessment Pulses: Dorsalis Pedis Palpable: [Left:Yes] [Right:Yes] Posterior Tibial Palpable: [Left:No] [Right:No] Extremity colors, hair growth, and conditions: Extremity Color: [Left:Hyperpigmented] [Right:Hyperpigmented] Hair Growth on Extremity: [Left:No] [Right:No] Temperature of Extremity: [Left:Warm] [Right:Warm] Capillary Refill: [Left:< 3 seconds] [Right:< 3 seconds] Toe Nail Assessment Left: Right: Thick: Yes Yes Discolored: Yes Yes Deformed: No No Improper Length and Hygiene: Yes Yes Notes ABI [place] BILATERAL >220 - all pulses were biphasic Electronic Signature(s) Signed: 01/22/2018 9:48:36 AM By: Montey Hora Previous Signature: 01/17/2018 5:32:05 PM Version By: Montey Hora Entered By: Montey Hora on 01/22/2018  09:48:36 Gregory [name] ([id number]) -------------------------------------------------------------------------------- Multi Wound Chart Details Patient Name: Gregory [name] Date of Service: 01/17/2018 2:30 PM Medical Record Number: [id number] Patient Account Number: [id number] Date of Birth/Sex: 1950-06-01 (68 y.o. M) Treating RN: Harold Barban Primary Care Curley Hogen: [name] Other Clinician: Referring Shernell Saldierna: [name] Treating Charelle Petrakis/Extender: Melburn Hake, HOYT Weeks in Treatment: 0 Vital Signs Height(in): 68 Pulse(bpm): 60 Weight(lbs): 285 Blood Pressure(mmHg): 120/60 Body Mass Index(BMI): 43 Temperature(F): 98.1 Respiratory Rate 16 (breaths/min): Photos: [1:No Photos] [2:No Photos] [N/A:N/A] Wound Location: [1:Right Lower Leg - Circumfernential] [2:Left Lower Leg - Lateral] [N/A:N/A] Wounding Event: [1:Gradually  Appeared] [2:Gradually Appeared] [N/A:N/A] Primary Etiology: [1:Venous Leg Ulcer] [2:Venous Leg Ulcer] [N/A:N/A] Comorbid History: [1:Cataracts, Hypertension, Type II Diabetes] [2:Cataracts, Hypertension, Type II Diabetes] [N/A:N/A] Date Acquired: [1:12/03/2017] [2:12/30/2017] [N/A:N/A] Weeks of Treatment: [1:0] [2:0] [N/A:N/A] Wound Status: [1:Open] [2:Open] [N/A:N/A] Measurements L x W x D [1:24.5x13x0.1] [2:1.4x0.7x0.1] [N/A:N/A] (cm) Area (cm) : [8:127.517] [2:0.77] [N/A:N/A] Volume (cm) : [1:25.015] [2:0.077] [N/A:N/A] Classification: [1:Partial Thickness] [2:Full Thickness Without Exposed Support Structures] [N/A:N/A] Exudate Amount: [1:Large] [2:Large] [N/A:N/A] Exudate Type: [1:Serous] [2:Sanguinous] [N/A:N/A] Exudate Color: [1:amber] [2:red] [N/A:N/A] Wound Margin: [1:Indistinct, nonvisible] [2:Flat and Intact] [N/A:N/A] Granulation Amount: [1:Large (67-100%)] [2:Large (67-100%)] [N/A:N/A] Granulation Quality: [1:Red] [2:Red] [N/A:N/A] Necrotic Amount: [1:None Present (0%)] [2:None Present (0%)] [N/A:N/A] Exposed  Structures: [1:Fascia: No Fat Layer (Subcutaneous Tissue) Exposed: No Tendon: No Muscle: No Joint: No Bone: No Limited to Skin Breakdown] [2:Fat Layer (Subcutaneous Tissue) Exposed: Yes Fascia: No Tendon: No Muscle: No Joint: No Bone: No] [N/A:N/A] Epithelialization: [1:None] [2:None] [N/A:N/A] Periwound Skin Texture: [1:Excoriation: Yes Induration: No Callus: No Crepitus: No] [2:Excoriation: No Induration: No Callus: No Crepitus: No] [N/A:N/A] Rash: No Rash: No Scarring: No Scarring: No Periwound Skin Moisture: Maceration: Yes Maceration: No N/A Dry/Scaly: No Dry/Scaly: No Periwound Skin Color: Hemosiderin Staining: Yes Hemosiderin Staining: Yes N/A Atrophie Blanche: No Atrophie Blanche: No Cyanosis: No Cyanosis: No Ecchymosis: No Ecchymosis: No Erythema: No Erythema: No Mottled: No Mottled: No Pallor: No Pallor: No Rubor: No Rubor: No Temperature: No Abnormality No Abnormality N/A Tenderness on Palpation: Yes Yes N/A Wound Preparation: Ulcer Cleansing: Ulcer Cleansing: N/A Rinsed/Irrigated with Saline Rinsed/Irrigated with Saline Topical Anesthetic Applied: Topical Anesthetic Applied: None Other: lidocaine 4% Treatment Notes Electronic Signature(s) Signed: 01/20/2018 5:13:49 PM By: Harold Barban Entered By: Harold Barban on 01/17/2018 15:21:27 Gregory [name] ([id number]) -------------------------------------------------------------------------------- [place] Details Patient Name: Gregory [name] Date of Service: 01/17/2018 2:30 PM Medical Record Number: [id number] Patient Account Number: [id number] Date of Birth/Sex: 09-16-1950 (68 y.o. M) Treating RN: Harold Barban Primary Care Cassius Cullinane: [name] Other Clinician: Referring Kevork Joyce: [name] Treating Nastassja Witkop/Extender: Melburn Hake, HOYT Weeks in Treatment: 0 Active Inactive Wound/Skin Impairment Nursing Diagnoses: Impaired tissue integrity Knowledge deficit related to  ulceration/compromised skin integrity Goals: Ulcer/skin breakdown will have a volume reduction of 30% by week 4 Date Initiated: 01/17/2018 Target Resolution Date: 02/17/2018 Goal Status: Active Interventions: Assess patient/caregiver ability to obtain necessary supplies Assess patient/caregiver ability to perform ulcer/skin care regimen upon admission and as needed Assess ulceration(s) every visit Notes: Electronic Signature(s) Signed: 01/20/2018 5:13:49 PM By: Harold Barban Entered By: Harold Barban on 01/17/2018 15:21:12 Gregory [name] ([id number]) -------------------------------------------------------------------------------- Pain Assessment Details Patient Name: Gregory [name] Date of Service: 01/17/2018 2:30 PM Medical Record Number: [id number] Patient Account Number: [id number] Date of Birth/Sex: [birth year] (68 y.o. M) Treating RN: Secundino Ginger Primary Care Lonzell Dorris: [name] Other Clinician: Referring Ayodeji Keimig: [name] Treating Phoenix Dresser/Extender: Melburn Hake, HOYT Weeks in Treatment: 0 Active Problems Location of Pain Severity and Description of Pain Patient Has Paino No Site Locations Pain Management and Medication Current Pain Management: Notes pt denies any pain at this time. Electronic Signature(s) Signed: 01/17/2018 2:50:02 PM By: Secundino Ginger Entered By: Secundino Ginger on 01/17/2018 14:34:36 Gregory [name] ([id number]) -------------------------------------------------------------------------------- Patient/Caregiver Education Details Patient Name: Gregory [name] Date of Service: 01/17/2018 2:30 PM Medical Record Number: [id number] Patient Account Number: [id number] Date of Birth/Gender: 07-May-1950 (68 y.o. M) Treating RN: Harold Barban Primary Care Physician: [name] Other Clinician: Referring Physician: [name] Treating Physician/Extender: Melburn Hake, HOYT Weeks in Treatment: 0 Education  Assessment Education Provided  To: Patient Education Topics Provided Welcome [facility]: Handouts: Welcome [facility] Methods: Demonstration, Explain/Verbal Responses: State content correctly Wound/Skin Impairment: Handouts: Caring for Your Ulcer Methods: Demonstration, Explain/Verbal Responses: State content correctly Electronic Signature(s) Signed: 01/20/2018 5:13:49 PM By: Harold Barban Entered By: Harold Barban on 01/17/2018 15:21:49 Gregory [name] ([id number]) -------------------------------------------------------------------------------- Wound Assessment Details Patient Name: Gregory [name] Date of Service: 01/17/2018 2:30 PM Medical Record Number: [id number] Patient Account Number: [id number] Date of Birth/Sex: Oct 01, 1950 (68 y.o. M) Treating RN: Montey Hora Primary Care Gregg Winchell: [name] Other Clinician: Referring Hajer Dwyer: [name] Treating Notnamed Scholz/Extender: Melburn Hake, HOYT Weeks in Treatment: 0 Wound Status Wound Number: 1 Primary Etiology: Venous Leg Ulcer Wound Location: Right Lower Leg - Circumfernential Wound Status: Open Wounding Event: Gradually Appeared Comorbid History: Cataracts, Hypertension, Type II Diabetes Date Acquired: 12/03/2017 Weeks Of Treatment: 0 Clustered Wound: No Wound Measurements Length: (cm) 24.5 Width: (cm) 13 Depth: (cm) 0.1 Area: (cm) 250.149 Volume: (cm) 25.015 % Reduction in Area: % Reduction in Volume: Epithelialization: None Tunneling: No Undermining: No Wound Description Classification: Partial Thickness Wound Margin: Indistinct, nonvisible Exudate Amount: Large Exudate Type: Serous Exudate Color: amber Foul Odor After Cleansing: No Slough/Fibrino No Wound Bed Granulation Amount: Large (67-100%) Exposed Structure Granulation Quality: Red Fascia Exposed: No Necrotic Amount: None Present (0%) Fat Layer (Subcutaneous Tissue) Exposed: No Tendon Exposed: No Muscle Exposed: No Joint Exposed:  No Bone Exposed: No Limited to Skin Breakdown Periwound Skin Texture Texture Color No Abnormalities Noted: No No Abnormalities Noted: No Callus: No Atrophie Blanche: No Crepitus: No Cyanosis: No Excoriation: Yes Ecchymosis: No Induration: No Erythema: No Rash: No Hemosiderin Staining: Yes Scarring: No Mottled: No Pallor: No Moisture Rubor: No No Abnormalities Noted: No Dry / Scaly: No Temperature / Pain Maceration: Yes Temperature: No Abnormality Gregory [name], Gregory [name] ([id number]) Tenderness on Palpation: Yes Wound Preparation Ulcer Cleansing: Rinsed/Irrigated with Saline Topical Anesthetic Applied: None Electronic Signature(s) Signed: 01/17/2018 5:32:05 PM By: Montey Hora Entered By: Montey Hora on 01/17/2018 14:56:28 Gregory [name] ([id number]) -------------------------------------------------------------------------------- Wound Assessment Details Patient Name: Gregory [name] Date of Service: 01/17/2018 2:30 PM Medical Record Number: [id number] Patient Account Number: [id number] Date of Birth/Sex: May 22, 1950 (68 y.o. M) Treating RN: Montey Hora Primary Care Kandi Brusseau: [name] Other Clinician: Referring Lorina Duffner: [name] Treating Aleea Hendry/Extender: Melburn Hake, HOYT Weeks in Treatment: 0 Wound Status Wound Number: 2 Primary Etiology: Venous Leg Ulcer Wound Location: Left Lower Leg - Lateral Wound Status: Open Wounding Event: Gradually Appeared Comorbid History: Cataracts, Hypertension, Type II Diabetes Date Acquired: 12/30/2017 Weeks Of Treatment: 0 Clustered Wound: No Photos Photo Uploaded By: Montey Hora on 01/17/2018 17:35:22 Wound Measurements Length: (cm) 1.4 Width: (cm) 0.7 Depth: (cm) 0.1 Area: (cm) 0.77 Volume: (cm) 0.077 % Reduction in Area: % Reduction in Volume: Epithelialization: None Tunneling: No Undermining: No Wound Description Full Thickness Without Exposed Support Classification: Structures Wound  Margin: Flat and Intact Exudate Large Amount: Exudate Type: Sanguinous Exudate Color: red Foul Odor After Cleansing: No Slough/Fibrino No Wound Bed Granulation Amount: Large (67-100%) Exposed Structure Granulation Quality: Red Fascia Exposed: No Necrotic Amount: None Present (0%) Fat Layer (Subcutaneous Tissue) Exposed: Yes Tendon Exposed: No Muscle Exposed: No Joint Exposed: No Bone Exposed: No Gregory [name], Gregory [name] ([id number]) Periwound Skin Texture Texture Color No Abnormalities Noted: No No Abnormalities Noted: No Callus: No Atrophie Blanche: No Crepitus: No Cyanosis: No Excoriation: No Ecchymosis: No Induration: No Erythema: No Rash: No Hemosiderin Staining: Yes Scarring: No Mottled:  No Pallor: No Moisture Rubor: No No Abnormalities Noted: No Dry / Scaly: No Temperature / Pain Maceration: No Temperature: No Abnormality Tenderness on Palpation: Yes Wound Preparation Ulcer Cleansing: Rinsed/Irrigated with Saline Topical Anesthetic Applied: Other: lidocaine 4%, Electronic Signature(s) Signed: 01/17/2018 5:32:05 PM By: Montey Hora Entered By: Montey Hora on 01/17/2018 14:59:16 Gregory [name] ([id number]) -------------------------------------------------------------------------------- Vitals Details Patient Name: Gregory [name] Date of Service: 01/17/2018 2:30 PM Medical Record Number: [id number] Patient Account Number: [id number] Date of Birth/Sex: 1950/07/04 (68 y.o. M) Treating RN: Secundino Ginger Primary Care Thierry Dobosz: [name] Other Clinician: Referring Marquese Burkland: [name] Treating Cristen Murcia/Extender: Melburn Hake, HOYT Weeks in Treatment: 0 Vital Signs Time Taken: 14:22 Temperature (F): 98.1 Height (in): 68 Pulse (bpm): 60 Source: Stated Respiratory Rate (breaths/min): 16 Weight (lbs): 285 Blood Pressure (mmHg): 120/60 Source: Stated Reference Range: 80 - 120 mg / dl Body Mass Index (BMI): 43.3 Electronic Signature(s) Signed:  01/17/2018 2:50:02 PM By: Secundino Ginger Entered BySecundino Ginger on 01/17/2018 14:29:40

## 2018-01-20 DIAGNOSIS — I10 Essential (primary) hypertension: Secondary | ICD-10-CM | POA: Diagnosis not present

## 2018-01-20 DIAGNOSIS — L03115 Cellulitis of right lower limb: Secondary | ICD-10-CM | POA: Diagnosis not present

## 2018-01-20 DIAGNOSIS — I890 Lymphedema, not elsewhere classified: Secondary | ICD-10-CM | POA: Diagnosis not present

## 2018-01-20 DIAGNOSIS — E11622 Type 2 diabetes mellitus with other skin ulcer: Secondary | ICD-10-CM | POA: Diagnosis not present

## 2018-01-20 DIAGNOSIS — I83028 Varicose veins of left lower extremity with ulcer other part of lower leg: Secondary | ICD-10-CM | POA: Diagnosis not present

## 2018-01-20 DIAGNOSIS — I83018 Varicose veins of right lower extremity with ulcer other part of lower leg: Secondary | ICD-10-CM | POA: Diagnosis not present

## 2018-01-20 DIAGNOSIS — L97822 Non-pressure chronic ulcer of other part of left lower leg with fat layer exposed: Secondary | ICD-10-CM | POA: Diagnosis not present

## 2018-01-20 DIAGNOSIS — L97812 Non-pressure chronic ulcer of other part of right lower leg with fat layer exposed: Secondary | ICD-10-CM | POA: Diagnosis not present

## 2018-01-20 NOTE — Progress Notes (Signed)
Gregory, [name] ([id number]) Visit Report for 01/20/2018 Arrival Information Details Patient Name: Gregory [name], Gregory [name] Date of Service: 01/20/2018 8:00 AM Medical Record Number: [id number] Patient Account Number: [id number] Date of Birth/Sex: 06-06-1950 (67 y.o. M) Treating RN: Montey Hora Primary Care Jakevion Arney: [name] Other Clinician: Referring Shalom Ware: [name] Treating Kemani Heidel/Extender: Melburn Hake, HOYT Weeks in Treatment: 0 Visit Information Patient Arrived: Ambulatory Arrival Time: 08:17 Accompanied By: self Transfer Assistance: None Patient Identification Verified: Yes Secondary Verification Process Completed: Yes Electronic Signature(s) Signed: 01/20/2018 3:56:47 PM By: Montey Hora Entered By: Montey Hora on 01/20/2018 08:26:34 Gregory [name] ([id number]) -------------------------------------------------------------------------------- Clinic Level of Care Assessment Details Patient Name: Gregory [name] Date of Service: 01/20/2018 8:00 AM Medical Record Number: [id number] Patient Account Number: [id number] Date of Birth/Sex: [birth year] (67 y.o. M) Treating RN: Montey Hora Primary Care Tezra Mahr: [name] Other Clinician: Referring Jaydyn Bozzo: [name] Treating Nataley Bahri/Extender: Melburn Hake, HOYT Weeks in Treatment: 0 Clinic Level of Care Assessment Items TOOL 4 Quantity Score []  - Use when only an EandM is performed on FOLLOW-UP visit 0 ASSESSMENTS - Nursing Assessment / Reassessment X - Reassessment of Co-morbidities (includes updates in patient status) 1 10 X- 1 5 Reassessment of Adherence to Treatment Plan ASSESSMENTS - Wound and Skin Assessment / Reassessment []  - Simple Wound Assessment / Reassessment - one wound 0 X- 2 5 Complex Wound Assessment / Reassessment - multiple wounds []  - 0 Dermatologic / Skin Assessment (not related to wound area) ASSESSMENTS - Focused Assessment []  - Circumferential Edema Measurements - multi  extremities 0 []  - 0 Nutritional Assessment / Counseling / Intervention []  - 0 Lower Extremity Assessment (monofilament, tuning fork, pulses) []  - 0 Peripheral Arterial Disease Assessment (using hand held doppler) ASSESSMENTS - Ostomy and/or Continence Assessment and Care []  - Incontinence Assessment and Management 0 []  - 0 Ostomy Care Assessment and Management (repouching, etc.) PROCESS - Coordination of Care X - Simple Patient / Family Education for ongoing care 1 15 []  - 0 Complex (extensive) Patient / Family Education for ongoing care X- 1 10 Staff obtains Programmer, systems, Records, Test Results / Process Orders []  - 0 Staff telephones HHA, Nursing Homes / Clarify orders / etc []  - 0 Routine Transfer to another Facility (non-emergent condition) []  - 0 Routine Hospital Admission (non-emergent condition) []  - 0 New Admissions / Biomedical engineer / Ordering NPWT, Apligraf, etc. []  - 0 Emergency Hospital Admission (emergent condition) X- 1 10 Simple Discharge Coordination PESACH, FRISCH ([id number]) []  - 0 Complex (extensive) Discharge Coordination PROCESS - Special Needs []  - Pediatric / Minor Patient Management 0 []  - 0 Isolation Patient Management []  - 0 Hearing / Language / Visual special needs []  - 0 Assessment of Community assistance (transportation, D/C planning, etc.) []  - 0 Additional assistance / Altered mentation []  - 0 Support Surface(s) Assessment (bed, cushion, seat, etc.) INTERVENTIONS - Wound Cleansing / Measurement []  - Simple Wound Cleansing - one wound 0 X- 2 5 Complex Wound Cleansing - multiple wounds X- 1 5 Wound Imaging (photographs - any number of wounds) []  - 0 Wound Tracing (instead of photographs) []  - 0 Simple Wound Measurement - one wound X- 2 5 Complex Wound Measurement - multiple wounds INTERVENTIONS - Wound Dressings []  - Small Wound Dressing one or multiple wounds 0 []  - 0 Medium Wound Dressing one or multiple wounds X- 2  20 Large Wound Dressing one or multiple wounds []  - 0 Application of Medications - topical []  - 0 Application of Medications -  injection INTERVENTIONS - Miscellaneous []  - External ear exam 0 []  - 0 Specimen Collection (cultures, biopsies, blood, body fluids, etc.) []  - 0 Specimen(s) / Culture(s) sent or taken to Lab for analysis []  - 0 Patient Transfer (multiple staff / Harrel Lemon Lift / Similar devices) []  - 0 Simple Staple / Suture removal (25 or less) []  - 0 Complex Staple / Suture removal (26 or more) []  - 0 Hypo / Hyperglycemic Management (close monitor of Blood Glucose) []  - 0 Ankle / Brachial Index (ABI) - do not check if billed separately []  - 0 Vital Signs AMADI, FRADY ([id number]) Has the patient been seen at the hospital within the last three years: Yes Total Score: 125 Level Of Care: New/Established - Level 4 Electronic Signature(s) Signed: 01/20/2018 3:56:47 PM By: Montey Hora Entered By: Montey Hora on 01/20/2018 08:29:05 Gregory [name] ([id number]) -------------------------------------------------------------------------------- Encounter Discharge Information Details Patient Name: Gregory [name] Date of Service: 01/20/2018 8:00 AM Medical Record Number: [id number] Patient Account Number: [id number] Date of Birth/Sex: [birth year] (67 y.o. M) Treating RN: Montey Hora Primary Care Thales Knipple: [name] Other Clinician: Referring Caleel Kiner: [name] Treating Laneisha Mino/Extender: Melburn Hake, HOYT Weeks in Treatment: 0 Encounter Discharge Information Items Discharge Condition: Stable Ambulatory Status: Ambulatory Discharge Destination: Home Transportation: Private Auto Accompanied By: self Schedule Follow-up Appointment: Yes Clinical Summary of Care: Electronic Signature(s) Signed: 01/20/2018 3:56:47 PM By: Montey Hora Entered By: Montey Hora on 01/20/2018 08:28:18 Gregory [name]  ([id number]) -------------------------------------------------------------------------------- Patient/Caregiver Education Details Patient Name: Gregory [name] Date of Service: 01/20/2018 8:00 AM Medical Record Number: [id number] Patient Account Number: [id number] Date of Birth/Gender: 1950-11-25 (68 y.o. M) Treating RN: Montey Hora Primary Care Physician: [name] Other Clinician: Referring Physician: [name] Treating Physician/Extender: Sharalyn Ink in Treatment: 0 Education Assessment Education Provided To: Patient Education Topics Provided Wound/Skin Impairment: Handouts: Other: reportable s/s Methods: Explain/Verbal Responses: State content correctly Electronic Signature(s) Signed: 01/20/2018 3:56:47 PM By: Montey Hora Entered By: Montey Hora on 01/20/2018 08:28:08 Gregory [name] ([id number]) -------------------------------------------------------------------------------- Wound Assessment Details Patient Name: Gregory [name] Date of Service: 01/20/2018 8:00 AM Medical Record Number: [id number] Patient Account Number: [id number] Date of Birth/Sex: 04/22/1950 (67 y.o. M) Treating RN: Montey Hora Primary Care Porfirio Bollier: [name] Other Clinician: Referring Reizy Dunlow: [name] Treating Rinaldo Macqueen/Extender: Melburn Hake, HOYT Weeks in Treatment: 0 Wound Status Wound Number: 1 Primary Etiology: Venous Leg Ulcer Wound Location: Right Lower Leg - Circumfernential Wound Status: Open Wounding Event: Gradually Appeared Comorbid Cataracts, Hypertension, Type II History: Diabetes Date Acquired: 12/03/2017 Weeks Of Treatment: 0 Clustered Wound: No Wound Measurements Length: (cm) 24.5 Width: (cm) 13 Depth: (cm) 0.1 Area: (cm) 250.149 Volume: (cm) 25.015 % Reduction in Area: 0% % Reduction in Volume: 0% Epithelialization: Medium (34-66%) Tunneling: No Wound Description Classification: Partial Thickness Foul Wound Margin: Indistinct,  nonvisible Sloug Exudate Amount: Large Exudate Type: Serous Exudate Color: amber Odor After Cleansing: No h/Fibrino No Wound Bed Granulation Amount: Large (67-100%) Exposed Structure Granulation Quality: Red Fascia Exposed: No Necrotic Amount: None Present (0%) Fat Layer (Subcutaneous Tissue) Exposed: No Tendon Exposed: No Muscle Exposed: No Joint Exposed: No Bone Exposed: No Limited to Skin Breakdown Periwound Skin Texture Texture Color No Abnormalities Noted: No No Abnormalities Noted: No Callus: No Atrophie Blanche: No Crepitus: No Cyanosis: No Excoriation: Yes Ecchymosis: No Induration: No Erythema: No Rash: No Hemosiderin Staining: Yes Scarring: No Mottled: No Pallor: No Moisture Rubor: No No Abnormalities Noted: No Dry / Scaly: No Temperature / Pain Maceration: Yes Temperature:  No Abnormality TRELL, SECRIST ([id number]) Tenderness on Palpation: Yes Wound Preparation Ulcer Cleansing: Rinsed/Irrigated with Saline Topical Anesthetic Applied: None Treatment Notes Wound #1 (Right, Circumferential Lower Leg) Notes silvercel to left leg, xtrasorb and abd to right leg with bilateral kerlix and coban wrap with unna to anchor Electronic Signature(s) Signed: 01/20/2018 3:56:47 PM By: Montey Hora Entered By: Montey Hora on 01/20/2018 08:27:08 Gregory [name] ([id number]) -------------------------------------------------------------------------------- Wound Assessment Details Patient Name: Gregory [name] Date of Service: 01/20/2018 8:00 AM Medical Record Number: [id number] Patient Account Number: [id number] Date of Birth/Sex: 1950-07-01 (67 y.o. M) Treating RN: Montey Hora Primary Care Dredyn Gubbels: [name] Other Clinician: Referring Lynna Zamorano: [name] Treating Oumar Marcott/Extender: Melburn Hake, HOYT Weeks in Treatment: 0 Wound Status Wound Number: 2 Primary Etiology: Venous Leg Ulcer Wound Location: Left Lower Leg - Lateral Wound Status:  Open Wounding Event: Gradually Appeared Comorbid History: Cataracts, Hypertension, Type II Diabetes Date Acquired: 12/30/2017 Weeks Of Treatment: 0 Clustered Wound: No Wound Measurements Length: (cm) 1.4 Width: (cm) 0.7 Depth: (cm) 0.1 Area: (cm) 0.77 Volume: (cm) 0.077 % Reduction in Area: 0% % Reduction in Volume: 0% Epithelialization: None Tunneling: No Undermining: No Wound Description Full Thickness Without Exposed Support Foul Odo Classification: Structures Slough/F Wound Margin: Flat and Intact Exudate Large Amount: Exudate Type: Sanguinous Exudate Color: red r After Cleansing: No ibrino No Wound Bed Granulation Amount: Large (67-100%) Exposed Structure Granulation Quality: Red Fascia Exposed: No Necrotic Amount: None Present (0%) Fat Layer (Subcutaneous Tissue) Exposed: Yes Tendon Exposed: No Muscle Exposed: No Joint Exposed: No Bone Exposed: No Periwound Skin Texture Texture Color No Abnormalities Noted: No No Abnormalities Noted: No Callus: No Atrophie Blanche: No Crepitus: No Cyanosis: No Excoriation: No Ecchymosis: No Induration: No Erythema: No Rash: No Hemosiderin Staining: Yes Scarring: No Mottled: No Pallor: No Moisture Rubor: No No Abnormalities Noted: No Dry / Scaly: No Temperature / Pain Reeser, Jyquan J. ([id number]) Maceration: No Temperature: No Abnormality Tenderness on Palpation: Yes Wound Preparation Ulcer Cleansing: Other: soap and water, Topical Anesthetic Applied: Other: lidocaine 4%, Treatment Notes Wound #2 (Left, Lateral Lower Leg) Notes silvercel to left leg, xtrasorb and abd to right leg with bilateral kerlix and coban wrap with unna to anchor Electronic Signature(s) Signed: 01/20/2018 3:56:47 PM By: Montey Hora Entered By: Montey Hora on 01/20/2018 08:27:26

## 2018-01-22 ENCOUNTER — Encounter: Payer: Medicare HMO | Admitting: Physician Assistant

## 2018-01-22 DIAGNOSIS — I890 Lymphedema, not elsewhere classified: Secondary | ICD-10-CM | POA: Diagnosis not present

## 2018-01-22 DIAGNOSIS — I87311 Chronic venous hypertension (idiopathic) with ulcer of right lower extremity: Secondary | ICD-10-CM | POA: Diagnosis not present

## 2018-01-22 DIAGNOSIS — S81802A Unspecified open wound, left lower leg, initial encounter: Secondary | ICD-10-CM | POA: Diagnosis not present

## 2018-01-22 DIAGNOSIS — S81801A Unspecified open wound, right lower leg, initial encounter: Secondary | ICD-10-CM | POA: Diagnosis not present

## 2018-01-22 DIAGNOSIS — E11622 Type 2 diabetes mellitus with other skin ulcer: Secondary | ICD-10-CM | POA: Diagnosis not present

## 2018-01-22 DIAGNOSIS — I10 Essential (primary) hypertension: Secondary | ICD-10-CM | POA: Diagnosis not present

## 2018-01-22 DIAGNOSIS — I87391 Chronic venous hypertension (idiopathic) with other complications of right lower extremity: Secondary | ICD-10-CM | POA: Diagnosis not present

## 2018-01-22 DIAGNOSIS — L97812 Non-pressure chronic ulcer of other part of right lower leg with fat layer exposed: Secondary | ICD-10-CM | POA: Diagnosis not present

## 2018-01-22 DIAGNOSIS — I83018 Varicose veins of right lower extremity with ulcer other part of lower leg: Secondary | ICD-10-CM | POA: Diagnosis not present

## 2018-01-22 DIAGNOSIS — L97822 Non-pressure chronic ulcer of other part of left lower leg with fat layer exposed: Secondary | ICD-10-CM | POA: Diagnosis not present

## 2018-01-22 DIAGNOSIS — I83028 Varicose veins of left lower extremity with ulcer other part of lower leg: Secondary | ICD-10-CM | POA: Diagnosis not present

## 2018-01-22 DIAGNOSIS — L03115 Cellulitis of right lower limb: Secondary | ICD-10-CM | POA: Diagnosis not present

## 2018-01-23 NOTE — Progress Notes (Signed)
Gregory [name] ([id number]) Visit Report for 01/17/2018 Chief Complaint Document Details Patient Name: Gregory [name], Gregory [name] Date of Service: 01/17/2018 2:30 PM Medical Record Number: [id number] Patient Account Number: [id number] Date of Birth/Sex: [birth year] (68 y.o. M) Treating RN: Harold Barban Primary Care Provider: [name] Other Clinician: Referring Provider: [name] Treating Provider/Extender: Melburn Hake, HOYT Weeks in Treatment: 0 Information Obtained from: Patient Chief Complaint Bilateral LE ulcers Electronic Signature(s) Signed: 01/23/2018 9:23:56 AM By: Worthy Keeler PA-C Entered By: Worthy Keeler on 01/17/2018 15:18:27 Gregory [name] ([id number]) -------------------------------------------------------------------------------- Debridement Details Patient Name: Gregory [name] Date of Service: 01/17/2018 2:30 PM Medical Record Number: [id number] Patient Account Number: [id number] Date of Birth/Sex: 1950-01-21 (68 y.o. M) Treating RN: Harold Barban Primary Care Provider: [name] Other Clinician: Referring Provider: [name] Treating Provider/Extender: Melburn Hake, HOYT Weeks in Treatment: 0 Debridement Performed for Wound #1 Right,Circumferential Lower Leg Assessment: Performed By: Physician STONE III, HOYT E., PA-C Debridement Type: Chemical/Enzymatic/Mechanical Agent Used: Gauze Severity of Tissue Pre Fat layer exposed Debridement: Level of Consciousness (Pre- Awake and Alert procedure): Pre-procedure Verification/Time Yes - 15:20 Out Taken: Start Time: 15:20 Pain Control: Lidocaine Instrument: Other : gauze Bleeding: None End Time: 15:24 Procedural Pain: 0 Post Procedural Pain: 0 Response to Treatment: Procedure was tolerated well Level of Consciousness Awake and Alert (Post-procedure): Post Debridement Measurements of Total Wound Length: (cm) 24.5 Width: (cm) 13 Depth: (cm) 0.1 Volume: (cm) 25.015 Character of  Wound/Ulcer Post Debridement: Improved Severity of Tissue Post Debridement: Fat layer exposed Post Procedure Diagnosis Same as Pre-procedure Electronic Signature(s) Signed: 01/20/2018 5:13:49 PM By: Harold Barban Signed: 01/23/2018 9:23:56 AM By: Worthy Keeler PA-C Entered By: Harold Barban on 01/17/2018 15:30:04 Gregory [name] ([id number]) -------------------------------------------------------------------------------- Debridement Details Patient Name: Gregory [name] Date of Service: 01/17/2018 2:30 PM Medical Record Number: [id number] Patient Account Number: [id number] Date of Birth/Sex: 11/27/1950 (68 y.o. M) Treating RN: Harold Barban Primary Care Provider: [name] Other Clinician: Referring Provider: [name] Treating Provider/Extender: Melburn Hake, HOYT Weeks in Treatment: 0 Debridement Performed for Wound #2 Left,Lateral Lower Leg Assessment: Performed By: Physician STONE III, HOYT E., PA-C Debridement Type: Chemical/Enzymatic/Mechanical Agent Used: Gauze Severity of Tissue Pre Fat layer exposed Debridement: Level of Consciousness (Pre- Awake and Alert procedure): Pre-procedure Verification/Time Yes - 15:20 Out Taken: Start Time: 15:20 Pain Control: Lidocaine Instrument: Other : gauze Bleeding: None End Time: 15:24 Procedural Pain: 0 Post Procedural Pain: 0 Response to Treatment: Procedure was tolerated well Level of Consciousness Awake and Alert (Post-procedure): Post Debridement Measurements of Total Wound Length: (cm) 1.4 Width: (cm) 0.7 Depth: (cm) 0.1 Volume: (cm) 0.077 Character of Wound/Ulcer Post Debridement: Improved Severity of Tissue Post Debridement: Fat layer exposed Post Procedure Diagnosis Same as Pre-procedure Electronic Signature(s) Signed: 01/20/2018 5:13:49 PM By: Harold Barban Signed: 01/23/2018 9:23:56 AM By: Worthy Keeler PA-C Entered By: Harold Barban on 01/17/2018 15:30:36 Gregory [name]  ([id number]) -------------------------------------------------------------------------------- HPI Details Patient Name: Gregory [name] Date of Service: 01/17/2018 2:30 PM Medical Record Number: [id number] Patient Account Number: [id number] Date of Birth/Sex: [birth year] (68 y.o. M) Treating RN: Harold Barban Primary Care Provider: [name] Other Clinician: Referring Provider: [name] Treating Provider/Extender: Melburn Hake, HOYT Weeks in Treatment: 0 History of Present Illness HPI Description: 01/17/18 on evaluation today patient presents for initial inspection concerning wounds that he has of the bilateral lower extremities. This has been an intermittent issue for him and he states that he's had swelling for at least a couple of years  that he can remember. 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 He does have a history of hypertension as well as diabetes mellitus type II. His hemoglobin A1c was 8.5 on 01/14/18. Currently he has been putting antibiotic ointment on it and in regard to the right lower extremity using a telfa pad which you can see the outline of where there is some maceration and skin breakdown. The weeping of the right lower extremity is more circumferential in nature. He is not currently on any oral antibiotic therapy. No fevers, chills, nausea, or vomiting noted at this time. Electronic Signature(s) Signed: 01/23/2018 9:23:56 AM By: Worthy Keeler PA-C Entered By: Worthy Keeler on 01/22/2018 00:28:35 Gregory [name] ([id number]) -------------------------------------------------------------------------------- Physical Exam Details Patient Name: Gregory [name] Date of Service: 01/17/2018 2:30 PM Medical Record Number: [id number] Patient Account Number: [id number] Date of Birth/Sex: November 11, 1950 (68 y.o.  M) Treating RN: Harold Barban Primary Care Provider: [name] Other Clinician: Referring Provider: [name] Treating Provider/Extender: Melburn Hake, HOYT Weeks in Treatment: 0 Constitutional sitting or standing blood pressure is within target range for patient.. pulse regular and within target range for patient.[name] respirations regular, non-labored and within target range for patient.[name] temperature within target range for patient.. Well- nourished and well-hydrated in no acute distress. Eyes conjunctiva clear no eyelid edema noted. pupils equal round and reactive to light and accommodation. Ears, Nose, Mouth, and Throat no gross abnormality of ear auricles or external auditory canals. normal hearing noted during conversation. mucus membranes moist. Respiratory normal breathing without difficulty. clear to auscultation bilaterally. Cardiovascular regular rate and rhythm with normal S1, S2. 2+ dorsalis pedis/posterior tibialis pulses. 2+ pitting edema of the bilateral lower extremities. Gastrointestinal (GI) soft, non-tender, non-distended, +BS. no ventral hernia noted. Musculoskeletal normal gait and posture. no significant deformity or arthritic changes, no loss or range of motion, no clubbing. Psychiatric this patient is able to make decisions and demonstrates good insight into disease process. Alert and Oriented x 3. pleasant and cooperative. Notes On evaluation today patient's wounds did require debridement bilaterally to clear away necrotic material and aid in healing. He tolerated this without complication at this point. Fortunately there does not appear to be significant pain nor any significant signs of infection which was good news. He does have swelling and erythema but I'm not certain this is actually related to cellulitis I believe it may be just do more to the lymphedema. Electronic Signature(s) Signed: 01/23/2018 9:23:56 AM By: Worthy Keeler PA-C Entered By: Worthy Keeler on 01/22/2018 00:29:36 Gregory [name] ([id number]) -------------------------------------------------------------------------------- Physician Orders Details Patient Name: IKER, NUTTALL. Date of Service: 01/17/2018 2:30 PM Medical Record Number: [id number] Patient Account Number: [id number] Date of Birth/Sex: 1950-08-18 (68 y.o. M) Treating RN: Harold Barban Primary Care Provider: [name] Other Clinician: Referring Provider: [name] Treating Provider/Extender: Melburn Hake, HOYT Weeks in Treatment: 0 Verbal / Phone Orders: No Diagnosis Coding ICD-10 Coding Code Description E11.622 Type 2 diabetes mellitus with other skin ulcer I87.2 Venous insufficiency (chronic) (peripheral) L97.812 Non-pressure chronic ulcer of other part of right lower leg with fat layer exposed L97.822 Non-pressure chronic ulcer of other part of left lower leg with fat layer exposed I10 Essential (primary) hypertension Wound Cleansing o May shower with protection. Primary Wound Dressing Wound #1 Right,Circumferential Lower Leg o XtraSorb Wound #2 Left,Lateral Lower Leg   o Silver Alginate Secondary Dressing Wound #1 Right,Circumferential Lower Leg o ABD pad Wound #2 Left,Lateral Lower Leg o ABD pad Follow-up Appointments Wound #1 Right,Circumferential Lower Leg o Return Appointment in 1 week. o Other: - Nurse Visit Monday January 20th Wound #2 Left,Lateral Lower Leg o Return Appointment in 1 week. o Other: - Nurse Visit Monday January 20th Edema Control o Kerlix and Coban - Bilateral Consults o Vascular - Arterial Study ABI, TBI with [facility] Patient Medications Allergies: No Known Allergies ZACKARIAH, VANDERPOL ([id number]) Notifications Medication Indication Start End Levaquin 01/20/2018 DOSE 1 - oral 750 mg tablet - 1 tablet oral taken 1 time a day for 10 days Electronic Signature(s) Signed: 01/20/2018 8:34:02 AM By: Worthy Keeler  PA-C Entered By: Worthy Keeler on 01/20/2018 08:34:02 Gregory [name] ([id number]) -------------------------------------------------------------------------------- Problem List Details Patient Name: Gregory [name] Date of Service: 01/17/2018 2:30 PM Medical Record Number: [id number] Patient Account Number: [id number] Date of Birth/Sex: 16-Aug-1950 (68 y.o. M) Treating RN: Harold Barban Primary Care Provider: [name] Other Clinician: Referring Provider: [name] Treating Provider/Extender: Melburn Hake, HOYT Weeks in Treatment: 0 Active Problems ICD-10 Evaluated Encounter Code Description Active Date Today Diagnosis E11.622 Type 2 diabetes mellitus with other skin ulcer 01/17/2018 No Yes I87.2 Venous insufficiency (chronic) (peripheral) 01/17/2018 No Yes L97.812 Non-pressure chronic ulcer of other part of right lower leg 01/17/2018 No Yes with fat layer exposed L97.822 Non-pressure chronic ulcer of other part of left lower leg with 01/17/2018 No Yes fat layer exposed [place] (primary) hypertension 01/17/2018 No Yes Inactive Problems Resolved Problems Electronic Signature(s) Signed: 01/23/2018 9:23:56 AM By: Worthy Keeler PA-C Entered By: Worthy Keeler on 01/17/2018 15:18:18 Gregory [name] ([id number]) -------------------------------------------------------------------------------- Progress Note Details Patient Name: Gregory [name] Date of Service: 01/17/2018 2:30 PM Medical Record Number: [id number] Patient Account Number: [id number] Date of Birth/Sex: December 01, 1950 (68 y.o. M) Treating RN: Harold Barban Primary Care Provider: [name] Other Clinician: Referring Provider: [name] Treating Provider/Extender: Melburn Hake, HOYT Weeks in Treatment: 0 Subjective Chief Complaint Information obtained from Patient Bilateral LE ulcers History of Present Illness (HPI) 01/17/18 on evaluation today patient presents for initial inspection concerning  wounds that he has of the bilateral lower extremities. This has been an intermittent issue for him and he states that he's had swelling for at least a couple of years that he can remember. 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 He does have a history of hypertension as well as diabetes mellitus type II. His hemoglobin A1c was 8.5 on 01/14/18. Currently he has been putting antibiotic ointment on it and in regard to the right lower extremity using a telfa pad which you can see the outline of where there is some maceration and skin breakdown. The weeping of the right lower extremity is more circumferential in nature. He is not currently on any oral antibiotic therapy. No fevers, chills, nausea, or vomiting noted at this time. Wound History Patient presents with 2 open wounds that have been present for approximately 6 weeks. Patient has been treating wounds in the following manner: dry bandage and compression socks. Laboratory tests have not been performed in the last month. Patient reportedly has not tested positive for an antibiotic resistant organism. Patient reportedly has not tested positive for osteomyelitis. Patient reportedly has not had testing performed to  evaluate circulation in the legs. Patient experiences the following problems associated with their wounds: swelling. Patient History Information obtained from Patient, Caregiver. Allergies No Known Allergies Family History Cancer - Siblings,Mother, No family history of Diabetes, Heart Disease, Hereditary Spherocytosis, Hypertension, Kidney Disease, Lung Disease, Seizures, Stroke, Thyroid Problems, Tuberculosis. Social History Never smoker, Marital Status - Married, Alcohol Use - Moderate, Drug Use - No History, Caffeine Use - Daily. Medical History Eyes Patient has history of Cataracts  - left; right removed Denies history of Glaucoma, Optic Neuritis Ear/Nose/Mouth/Throat Denies history of Chronic sinus problems/congestion, Middle ear problems Hematologic/Lymphatic Denies history of Anemia, Hemophilia, Human Immunodeficiency Virus, Lymphedema, Sickle Cell Disease Respiratory MATSON, WELCH ([id number]) Denies history of Aspiration, Asthma, Chronic Obstructive Pulmonary Disease (COPD), Pneumothorax, Sleep Apnea, Tuberculosis Cardiovascular Patient has history of Hypertension Denies history of Angina, Arrhythmia, Congestive Heart Failure, Coronary Artery Disease, Deep Vein Thrombosis, Hypotension, Myocardial Infarction, Peripheral Arterial Disease, Peripheral Venous Disease, Phlebitis, Vasculitis Gastrointestinal Denies history of Cirrhosis , Colitis, Crohn s, Hepatitis A, Hepatitis B, Hepatitis C Endocrine Patient has history of Type II Diabetes - borderline Denies history of Type I Diabetes Genitourinary Denies history of End Stage Renal Disease Immunological Denies history of Lupus Erythematosus, Raynaud s, Scleroderma Integumentary (Skin) Denies history of History of Burn, History of pressure wounds Musculoskeletal Denies history of Gout, Rheumatoid Arthritis, Osteoarthritis, Osteomyelitis Neurologic Denies history of Dementia, Neuropathy, Quadriplegia, Paraplegia, Seizure Disorder Oncologic Denies history of Received Chemotherapy, Received Radiation Psychiatric Denies history of Anorexia/bulimia, Confinement Anxiety Medical And Surgical History Notes Cardiovascular aortic insufficiency Review of Systems (ROS) Constitutional Symptoms (General Health) Denies complaints or symptoms of Fatigue, Fever, Chills, Marked Weight Change. Eyes Complains or has symptoms of Glasses / Contacts - glasses. Denies complaints or symptoms of Dry Eyes, Vision Changes. Ear/Nose/Mouth/Throat Denies complaints or symptoms of Difficult clearing ears,  Sinusitis. Hematologic/Lymphatic Denies complaints or symptoms of Bleeding / Clotting Disorders, Human Immunodeficiency Virus. Respiratory Denies complaints or symptoms of Chronic or frequent coughs, Shortness of Breath. Cardiovascular Complains or has symptoms of LE edema. Denies complaints or symptoms of Chest pain. Gastrointestinal Denies complaints or symptoms of Frequent diarrhea, Nausea, Vomiting. Endocrine Denies complaints or symptoms of Hepatitis, Thyroid disease, Polydypsia (Excessive Thirst). Genitourinary Denies complaints or symptoms of Kidney failure/ Dialysis, Incontinence/dribbling. Immunological Denies complaints or symptoms of Hives, Itching. Integumentary (Skin) Complains or has symptoms of Wounds, Swelling. Denies complaints or symptoms of Bleeding or bruising tendency, Breakdown. Musculoskeletal Denies complaints or symptoms of Muscle Pain, Muscle Weakness. Neurologic Denies complaints or symptoms of Numbness/parasthesias, Focal/Weakness. EANN, CLELAND ([id number]) Psychiatric Denies complaints or symptoms of Anxiety, Claustrophobia. General Notes: patient is adopted and does not know much family history Objective Constitutional sitting or standing blood pressure is within target range for patient.. pulse regular and within target range for patient.[name] respirations regular, non-labored and within target range for patient.[name] temperature within target range for patient.. Well- nourished and well-hydrated in no acute distress. Vitals Time Taken: 2:22 PM, Height: 68 in, Source: Stated, Weight: 285 lbs, Source: Stated, BMI: 43.3, Temperature: 98.1 F, Pulse: 60 bpm, Respiratory Rate: 16 breaths/min, Blood Pressure: 120/60 mmHg. Eyes conjunctiva clear no eyelid edema noted. pupils equal round and reactive to light and accommodation. Ears, Nose, Mouth, and Throat no gross abnormality of ear auricles or external auditory canals. normal hearing noted during  conversation. mucus membranes moist. Respiratory normal breathing without difficulty. clear to auscultation bilaterally. Cardiovascular regular rate and rhythm with normal S1, S2. 2+ dorsalis pedis/posterior tibialis pulses. 2+ pitting edema of the  bilateral lower extremities. Gastrointestinal (GI) soft, non-tender, non-distended, +BS. no ventral hernia noted. Musculoskeletal normal gait and posture. no significant deformity or arthritic changes, no loss or range of motion, no clubbing. Psychiatric this patient is able to make decisions and demonstrates good insight into disease process. Alert and Oriented x 3. pleasant and cooperative. General Notes: On evaluation today patient's wounds did require debridement bilaterally to clear away necrotic material and aid in healing. He tolerated this without complication at this point. Fortunately there does not appear to be significant pain nor any significant signs of infection which was good news. He does have swelling and erythema but I'm not certain this is actually related to cellulitis I believe it may be just do more to the lymphedema. Integumentary (Hair, Skin) Wound #1 status is Open. Original cause of wound was Gradually Appeared. The wound is located on the Right,Circumferential Lower Leg. The wound measures 24.5cm length x 13cm width x 0.1cm depth; 250.149cm^2 area and 25.015cm^3 volume. The wound is limited to skin breakdown. There is no tunneling or undermining noted. There is a large amount of serous drainage noted. The wound margin is indistinct and nonvisible. There is large (67-100%) red granulation within the wound bed. There is no necrotic tissue within the wound bed. The periwound skin appearance exhibited: VENANCIO, CHENIER ([id number]) Excoriation, Maceration, Hemosiderin Staining. The periwound skin appearance did not exhibit: Callus, Crepitus, Induration, Rash, Scarring, Dry/Scaly, Atrophie Blanche, Cyanosis, Ecchymosis,  Mottled, Pallor, Rubor, Erythema. Periwound temperature was noted as No Abnormality. The periwound has tenderness on palpation. Wound #2 status is Open. Original cause of wound was Gradually Appeared. The wound is located on the Left,Lateral Lower Leg. The wound measures 1.4cm length x 0.7cm width x 0.1cm depth; 0.77cm^2 area and 0.077cm^3 volume. There is Fat Layer (Subcutaneous Tissue) Exposed exposed. There is no tunneling or undermining noted. There is a large amount of sanguinous drainage noted. The wound margin is flat and intact. There is large (67-100%) red granulation within the wound bed. There is no necrotic tissue within the wound bed. The periwound skin appearance exhibited: Hemosiderin Staining. The periwound skin appearance did not exhibit: Callus, Crepitus, Excoriation, Induration, Rash, Scarring, Dry/Scaly, Maceration, Atrophie Blanche, Cyanosis, Ecchymosis, Mottled, Pallor, Rubor, Erythema. Periwound temperature was noted as No Abnormality. The periwound has tenderness on palpation. Assessment Active Problems ICD-10 Type 2 diabetes mellitus with other skin ulcer Venous insufficiency (chronic) (peripheral) Non-pressure chronic ulcer of other part of right lower leg with fat layer exposed Non-pressure chronic ulcer of other part of left lower leg with fat layer exposed Essential (primary) hypertension Procedures Wound #1 Pre-procedure diagnosis of Wound #1 is a Venous Leg Ulcer located on the Right,Circumferential Lower Leg .Severity of Tissue Pre Debridement is: Fat layer exposed. There was a Chemical/Enzymatic/Mechanical debridement performed by STONE III, HOYT E., PA-C. With the following instrument(s): gauze after achieving pain control using Lidocaine. Other agent used was Gauze. A time out was conducted at 15:20, prior to the start of the procedure. There was no bleeding. The procedure was tolerated well with a pain level of 0 throughout and a pain level of 0 following  the procedure. Post Debridement Measurements: 24.5cm length x 13cm width x 0.1cm depth; 25.015cm^3 volume. Character of Wound/Ulcer Post Debridement is improved. Severity of Tissue Post Debridement is: Fat layer exposed. Post procedure Diagnosis Wound #1: Same as Pre-Procedure Wound #2 Pre-procedure diagnosis of Wound #2 is a Venous Leg Ulcer located on the Left,Lateral Lower Leg .Severity of Tissue Pre Debridement is:  Fat layer exposed. There was a Chemical/Enzymatic/Mechanical debridement performed by STONE III, HOYT E., PA-C. With the following instrument(s): gauze after achieving pain control using Lidocaine. Other agent used was Gauze. A time out was conducted at 15:20, prior to the start of the procedure. There was no bleeding. The procedure was tolerated well with a pain level of 0 throughout and a pain level of 0 following the procedure. Post Debridement Measurements: 1.4cm length x 0.7cm width x 0.1cm depth; 0.077cm^3 volume. Character of Wound/Ulcer Post Debridement is improved. Severity of Tissue Post Debridement is: Fat layer exposed. Post procedure Diagnosis Wound #2: Same as Pre-Procedure KINNEY, SACKMANN ([id number]) Plan Wound Cleansing: May shower with protection. Primary Wound Dressing: Wound #1 Right,Circumferential Lower Leg: XtraSorb Wound #2 Left,Lateral Lower Leg: Silver Alginate Secondary Dressing: Wound #1 Right,Circumferential Lower Leg: ABD pad Wound #2 Left,Lateral Lower Leg: ABD pad Follow-up Appointments: Wound #1 Right,Circumferential Lower Leg: Return Appointment in 1 week. Other: - Nurse Visit Monday January 20th Wound #2 Left,Lateral Lower Leg: Return Appointment in 1 week. Other: - Nurse Visit Monday January 20th Edema Control: Kerlix and Coban - Bilateral Consults ordered were: Vascular - Arterial Study ABI, TBI with [facility] The following medication(s) was prescribed: Levaquin oral 750 mg tablet 1 1 tablet oral taken 1  time a day for 10 days starting 01/20/2018 My suggestion at this point is gonna be that we initiate the above wound care measures for the next week. Patient was in agreement with plan. This will include bringing him back on Monday for a repeat wrap in order to ensure there's no sign of infection and nothing worsening at that point. I'm also going to send him for an arterial study with ABI and TBI with [facility]. In the meantime we will utilize a Kerlex and Coban wrap which should provide some compression while not being too constrictive until we can obtain the arterial studies. He was in agreement with this plan. Please see above for specific wound care orders. We will see patient for re-evaluation in 1 week(s) here in the clinic. If anything worsens or changes patient will contact our office for additional recommendations. Addendum: patient returns prepared nurse visit for a wrap change on Monday 01/20/18. Unfortunately it did appear that he was having some increasing cellulitis of the right lower extremity which had me somewhat more concerned at this point. For that reason I did go ahead and have this rewrapped but recommended a repeat nurse visit on Wednesday in order to ensure nothing is worsening. I did also go ahead and prescribe Levaquin for him which was sent to the pharmacy. We will see were things stand on Wednesday and I will see him for a provider visit on Friday. Electronic Signature(s) Signed: 01/23/2018 9:23:56 AM By: Cathi Roan, Guy Sandifer ([id number]) Entered By: Worthy Keeler on 01/22/2018 00:31:42 Gregory [name] ([id number]) -------------------------------------------------------------------------------- ROS/PFSH Details Patient Name: JACARIE, PATE Date of Service: 01/17/2018 2:30 PM Medical Record Number: [id number] Patient Account Number: [id number] Date of Birth/Sex: [birth year] (67 y.o. M) Treating RN: Montey Hora Primary Care  Provider: [name] Other Clinician: Referring Provider: [name] Treating Provider/Extender: Melburn Hake, HOYT Weeks in Treatment: 0 Information Obtained From Patient Caregiver Wound History Do you currently have one or more open woundso Yes How many open wounds do you currently haveo 2 Approximately how long have you had your woundso 6 weeks How have you been treating your wound(s) until nowo dry bandage and  compression socks Has your wound(s) ever healed and then re-openedo No Have you had any lab work done in the past montho No Have you tested positive for an antibiotic resistant organism (MRSA, VRE)o No Have you tested positive for osteomyelitis (bone infection)o No Have you had any tests for circulation on your legso No Have you had other problems associated with your woundso Swelling Constitutional Symptoms (General Health) Complaints and Symptoms: Negative for: Fatigue; Fever; Chills; Marked Weight Change Eyes Complaints and Symptoms: Positive for: Glasses / Contacts - glasses Negative for: Dry Eyes; Vision Changes Medical History: Positive for: Cataracts - left; right removed Negative for: Glaucoma; Optic Neuritis Ear/Nose/Mouth/Throat Complaints and Symptoms: Negative for: Difficult clearing ears; Sinusitis Medical History: Negative for: Chronic sinus problems/congestion; Middle ear problems Hematologic/Lymphatic Complaints and Symptoms: Negative for: Bleeding / Clotting Disorders; Human Immunodeficiency Virus Medical History: Negative for: Anemia; Hemophilia; Human Immunodeficiency Virus; Lymphedema; Sickle Cell Disease Respiratory Complaints and Symptoms: Negative for: Chronic or frequent coughs; Shortness of Breath CUINN, WESTERHOLD ([id number]) Medical History: Negative for: Aspiration; Asthma; Chronic Obstructive Pulmonary Disease (COPD); Pneumothorax; Sleep Apnea; Tuberculosis Cardiovascular Complaints and Symptoms: Positive for: LE edema Negative  for: Chest pain Medical History: Positive for: Hypertension Negative for: Angina; Arrhythmia; Congestive Heart Failure; Coronary Artery Disease; Deep Vein Thrombosis; Hypotension; Myocardial Infarction; Peripheral Arterial Disease; Peripheral Venous Disease; Phlebitis; Vasculitis Past Medical History Notes: aortic insufficiency Gastrointestinal Complaints and Symptoms: Negative for: Frequent diarrhea; Nausea; Vomiting Medical History: Negative for: Cirrhosis ; Colitis; Crohnos; Hepatitis A; Hepatitis B; Hepatitis C Endocrine Complaints and Symptoms: Negative for: Hepatitis; Thyroid disease; Polydypsia (Excessive Thirst) Medical History: Positive for: Type II Diabetes - borderline Negative for: Type I Diabetes Genitourinary Complaints and Symptoms: Negative for: Kidney failure/ Dialysis; Incontinence/dribbling Medical History: Negative for: End Stage Renal Disease Immunological Complaints and Symptoms: Negative for: Hives; Itching Medical History: Negative for: Lupus Erythematosus; Raynaudos; Scleroderma Integumentary (Skin) Complaints and Symptoms: Positive for: Wounds; Swelling Negative for: Bleeding or bruising tendency; Breakdown Medical History: KENZEL, RUESCH ([id number]) Negative for: History of Burn; History of pressure wounds Musculoskeletal Complaints and Symptoms: Negative for: Muscle Pain; Muscle Weakness Medical History: Negative for: Gout; Rheumatoid Arthritis; Osteoarthritis; Osteomyelitis Neurologic Complaints and Symptoms: Negative for: Numbness/parasthesias; Focal/Weakness Medical History: Negative for: Dementia; Neuropathy; Quadriplegia; Paraplegia; Seizure Disorder Psychiatric Complaints and Symptoms: Negative for: Anxiety; Claustrophobia Medical History: Negative for: Anorexia/bulimia; Confinement Anxiety Oncologic Medical History: Negative for: Received Chemotherapy; Received Radiation HBO Extended History  Items Eyes: Cataracts Immunizations Pneumococcal Vaccine: Received Pneumococcal Vaccination: No Implantable Devices Family and Social History Cancer: Yes - Siblings,Mother; Diabetes: No; Heart Disease: No; Hereditary Spherocytosis: No; Hypertension: No; Kidney Disease: No; Lung Disease: No; Seizures: No; Stroke: No; Thyroid Problems: No; Tuberculosis: No; Never smoker; Marital Status - Married; Alcohol Use: Moderate; Drug Use: No History; Caffeine Use: Daily; Financial Concerns: No; Food, Clothing or Shelter Needs: No; Support System Lacking: No; Transportation Concerns: No; Advanced Directives: No; Patient does not want information on Advanced Directives Notes patient is adopted and does not know much family history Electronic Signature(s) Signed: 01/17/2018 5:32:05 PM By: Montey Hora Signed: 01/23/2018 9:23:56 AM By: Worthy Keeler PA-C Entered By: Montey Hora on 01/17/2018 14:48:39 Gregory [name] ([id number]) -------------------------------------------------------------------------------- [address] Details Patient Name: Gregory [name] Date of Service: 01/17/2018 Medical Record Number: [id number] Patient Account Number: [id number] Date of Birth/Sex: 1950-06-25 (68 y.o. M) Treating RN: Harold Barban Primary Care Provider: [name] Other Clinician: Referring Provider: [name] Treating Provider/Extender: Melburn Hake, HOYT Weeks in Treatment: 0 Diagnosis Coding ICD-10 Codes  Code Description E11.622 Type 2 diabetes mellitus with other skin ulcer I87.2 Venous insufficiency (chronic) (peripheral) L97.812 Non-pressure chronic ulcer of other part of right lower leg with fat layer exposed L97.822 Non-pressure chronic ulcer of other part of left lower leg with fat layer exposed [place] (primary) hypertension Facility Procedures CPT4 Code: 34356861 Description: 68372 - DEBR[place] W/O ANES NON SELECT Modifier: Quantity: 1 CPT4 Code: [id number] Description: 20802  - [place] W/O ANES NON SELECT Modifier: Quantity: 1 Physician Procedures CPT4 Code Description: 2336122 [id number] - WC PHYS LEVEL 4 - NEW PT ICD-10 Diagnosis Description E11.622 Type 2 diabetes mellitus with other skin ulcer I87.2 Venous insufficiency (chronic) (peripheral) L97.812 Non-pressure chronic ulcer of other part of  right lower leg wit L97.822 Non-pressure chronic ulcer of other part of left lower leg with Modifier: h fat layer expo fat layer expos Quantity: 1 sed ed Electronic Signature(s) Signed: 01/20/2018 9:58:15 PM By: Worthy Keeler PA-C Entered By: Worthy Keeler on 01/20/2018 21:22:33

## 2018-01-24 ENCOUNTER — Encounter: Payer: Medicare HMO | Admitting: Physician Assistant

## 2018-01-24 DIAGNOSIS — E11622 Type 2 diabetes mellitus with other skin ulcer: Secondary | ICD-10-CM | POA: Diagnosis not present

## 2018-01-24 DIAGNOSIS — L97822 Non-pressure chronic ulcer of other part of left lower leg with fat layer exposed: Secondary | ICD-10-CM | POA: Diagnosis not present

## 2018-01-24 DIAGNOSIS — L97812 Non-pressure chronic ulcer of other part of right lower leg with fat layer exposed: Secondary | ICD-10-CM | POA: Diagnosis not present

## 2018-01-24 DIAGNOSIS — L03115 Cellulitis of right lower limb: Secondary | ICD-10-CM | POA: Diagnosis not present

## 2018-01-24 DIAGNOSIS — I83018 Varicose veins of right lower extremity with ulcer other part of lower leg: Secondary | ICD-10-CM | POA: Diagnosis not present

## 2018-01-24 DIAGNOSIS — I872 Venous insufficiency (chronic) (peripheral): Secondary | ICD-10-CM | POA: Diagnosis not present

## 2018-01-24 DIAGNOSIS — I890 Lymphedema, not elsewhere classified: Secondary | ICD-10-CM | POA: Diagnosis not present

## 2018-01-24 DIAGNOSIS — I83028 Varicose veins of left lower extremity with ulcer other part of lower leg: Secondary | ICD-10-CM | POA: Diagnosis not present

## 2018-01-24 DIAGNOSIS — I10 Essential (primary) hypertension: Secondary | ICD-10-CM | POA: Diagnosis not present

## 2018-01-24 NOTE — Progress Notes (Signed)
SHON, INDELICATO ([id number]) Visit Report for 01/22/2018 HPI Details Patient Name: Gregory [name], Gregory [name] Date of Service: 01/22/2018 8:30 AM Medical Record Number: [id number] Patient Account Number: [id number] Date of Birth/Sex: 02/27/1950 (67 y.o. M) Treating RN: Cornell Barman Primary Care Provider: [name] Other Clinician: Referring Provider: [name] Treating Provider/Extender: Ricard Dillon Weeks in Treatment: 0 History of Present Illness HPI Description: 01/17/18 on evaluation today patient presents for initial inspection concerning wounds that he has of the bilateral lower extremities. This has been an intermittent issue for him and he states that he's had swelling for at least a couple of years that he can remember. 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 He does have a history of hypertension as well as diabetes mellitus type II. His hemoglobin A1c was 8.5 on 01/14/18. Currently he has been putting antibiotic ointment on it and in regard to the right lower extremity using a telfa pad which you can see the outline of where there is some maceration and skin breakdown. The weeping of the right lower extremity is more circumferential in nature. He is not currently on any oral antibiotic therapy. No fevers, chills, nausea, or vomiting noted at this time. 1/22; the patient was here for a nurse visit today but I was asked to look at the patient because of concerns raised by his wife and daughter who are present. The patient is on Levaquin based on concern of cellulitis in the right lower leg. This was apparently started on Monday. His daughter and wife are concerned about the increasing erythema predominantly on the anterior foot and anterior leg. There does not appear to be a lot of pain here but there is weeping lymphedema fluid  coming out of the anterior leg with loss of epithelialization. The entire area looks somewhat angry but there is no palpable tenderness. There is extensive damage on the posterior calf as well. On the left leg he has a small wound that looks improved. The patient has not been systemically unwell no fever no chills. He worked a full day Designer, fashion/clothing I believe]. His daughter is actually a nurse with previous MedSurg training and should be able to change his dressings Electronic Signature(s) Signed: 01/22/2018 5:53:07 PM By: Linton Ham MD Entered By: Linton Ham on 01/22/2018 10:44:47 Gregory [name] ([id number]) -------------------------------------------------------------------------------- Physical Exam Details Patient Name: Gregory [name], Gregory [name] Date of Service: 01/22/2018 8:30 AM Medical Record Number: [id number] Patient Account Number: [id number] Date of Birth/Sex: 05-03-1950 (67 y.o. M) Treating RN: Cornell Barman Primary Care Provider: [name] Other Clinician: Referring Provider: [name] Treating Provider/Extender: Ricard Dillon Weeks in Treatment: 0 Constitutional Sitting or standing Blood Pressure is within target range for patient.. Pulse regular and within target range for patient.[name] Respirations regular, non-labored and within target range.. Temperature is normal and within the target range for the patient.[name] appears in no distress. Eyes Conjunctivae clear. No discharge. Respiratory Respiratory effort is easy and symmetric bilaterally. Rate is normal at rest and on room air.. Bilateral breath sounds are clear and equal in all lobes with no wheezes, rales or rhonchi.. Cardiovascular Pansystolic murmur compatible with mitral regurgitation. Pedal pulses are easily palpable on the right and the left although his ABIs in the clinic were noncompressible. Bilateral lymphedema. Integumentary (Hair, Skin) Patient clearly has lymphedema in both  legs weeping lymphedema fluid  on the right. Psychiatric No evidence of depression, anxiety, or agitation. Calm, cooperative, and communicative. Appropriate interactions and affect.. Notes Wound exam; on evaluation today the patient's wounds did not require debridement. The area on the left lateral calf looks healthy and appears to be epithelializing. He has open areas on the right anterior and right posterior calf. Especially anteriorly he is weeping edema fluid. There is erythema on the right anterior part of this and also the dorsal foot but no palpable tenderness. Electronic Signature(s) Signed: 01/22/2018 5:53:07 PM By: Linton Ham MD Entered By: Linton Ham on 01/22/2018 10:47:54 Gregory [name] ([id number]) -------------------------------------------------------------------------------- Physician Orders Details Patient Name: Gregory [name], Gregory [name] Date of Service: 01/22/2018 8:30 AM Medical Record Number: [id number] Patient Account Number: [id number] Date of Birth/Sex: Apr 24, 1950 (67 y.o. M) Treating RN: Cornell Barman Primary Care Provider: [name] Other Clinician: Referring Provider: [name] Treating Provider/Extender: Tito Dine in Treatment: 0 Verbal / Phone Orders: No Diagnosis Coding Wound Cleansing Wound #1 Right,Circumferential Lower Leg o May shower with protection. Wound #2 Left,Lateral Lower Leg o May shower with protection. Skin Barriers/Peri-Wound Care Wound #1 Right,Circumferential Lower Leg o Triamcinolone Acetonide Ointment (TCA) Primary Wound Dressing Wound #1 Right,Circumferential Lower Leg o Silver Alginate o XtraSorb Wound #2 Left,Lateral Lower Leg o Silver Alginate Secondary Dressing Wound #2 Left,Lateral Lower Leg o Dry Gauze Dressing Change Frequency o Change Dressing Monday, Wednesday, Friday Follow-up Appointments Wound #1 Right,Circumferential Lower Leg o Return Appointment in 1 week. o Other: -  Nurse Visit Monday, Wed, Fri Wound #2 Left,Lateral Lower Leg o Return Appointment in 1 week. o Other: - Nurse Visit Monday, Wed, Fri Edema Control Wound #1 Right,Circumferential Lower Leg o 3 Layer Compression System - Bilateral o Elevate legs to the level of the heart and pump ankles as often as possible Wound #2 Left,Lateral Lower Leg o 3 Layer Compression System - Bilateral o Elevate legs to the level of the heart and pump ankles as often as possible Gregory [name], Gregory [name] ([id number]) Electronic Signature(s) Signed: 01/22/2018 5:53:07 PM By: Linton Ham MD Signed: 01/23/2018 5:43:44 PM By: Gretta Cool, BSN, RN, CWS, Kim RN, BSN Entered By: Gretta Cool, BSN, RN, CWS, Kim on 01/22/2018 09:29:29 Gregory [name] ([id number]) -------------------------------------------------------------------------------- Problem List Details Patient Name: Gregory [name], Gregory [name] Date of Service: 01/22/2018 8:30 AM Medical Record Number: [id number] Patient Account Number: [id number] Date of Birth/Sex: 1950/10/19 (67 y.o. M) Treating RN: Cornell Barman Primary Care Provider: [name] Other Clinician: Referring Provider: [name] Treating Provider/Extender: Tito Dine in Treatment: 0 Active Problems ICD-10 Evaluated Encounter Code Description Active Date Today Diagnosis E11.622 Type 2 diabetes mellitus with other skin ulcer 01/17/2018 No Yes I87.2 Venous insufficiency (chronic) (peripheral) 01/17/2018 No Yes L97.812 Non-pressure chronic ulcer of other part of right lower leg 01/17/2018 No Yes with fat layer exposed L97.822 Non-pressure chronic ulcer of other part of left lower leg with 01/17/2018 No Yes fat layer exposed I10 Essential (primary) hypertension 01/17/2018 No Yes L03.115 Cellulitis of right lower limb 01/22/2018 No Yes Inactive Problems Resolved Problems Electronic Signature(s) Signed: 01/22/2018 5:53:07 PM By: Linton Ham MD Entered By: Linton Ham on 01/22/2018  10:23:17 Gregory [name] ([id number]) -------------------------------------------------------------------------------- Progress Note Details Patient Name: Gregory [name] Date of Service: 01/22/2018 8:30 AM Medical Record Number: [id number] Patient Account Number: [id number] Date of Birth/Sex: 10-Jul-1950 (67 y.o. M) Treating RN: Cornell Barman Primary Care Provider: [name] Other Clinician: Referring Provider: [name] Treating Provider/Extender: Ricard Dillon Weeks in Treatment: 0 Subjective History of  Present Illness (HPI) 01/17/18 on evaluation today patient presents for initial inspection concerning wounds that he has of the bilateral lower extremities. This has been an intermittent issue for him and he states that he's had swelling for at least a couple of years that he can remember. 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 He does have a history of hypertension as well as diabetes mellitus type II. His hemoglobin A1c was 8.5 on 01/14/18. Currently he has been putting antibiotic ointment on it and in regard to the right lower extremity using a telfa pad which you can see the outline of where there is some maceration and skin breakdown. The weeping of the right lower extremity is more circumferential in nature. He is not currently on any oral antibiotic therapy. No fevers, chills, nausea, or vomiting noted at this time. 1/22; the patient was here for a nurse visit today but I was asked to look at the patient because of concerns raised by his wife and daughter who are present. The patient is on Levaquin based on concern of cellulitis in the right lower leg. This was apparently started on Monday. His daughter and wife are concerned about the increasing erythema predominantly on the anterior foot and anterior leg. There does not  appear to be a lot of pain here but there is weeping lymphedema fluid coming out of the anterior leg with loss of epithelialization. The entire area looks somewhat angry but there is no palpable tenderness. There is extensive damage on the posterior calf as well. On the left leg he has a small wound that looks improved. The patient has not been systemically unwell no fever no chills. He worked a full day Designer, fashion/clothing I believe]. His daughter is actually a nurse with previous MedSurg training and should be able to change his dressings Objective Constitutional Sitting or standing Blood Pressure is within target range for patient.. Pulse regular and within target range for patient.[name] Respirations regular, non-labored and within target range.. Temperature is normal and within the target range for the patient.[name] appears in no distress. Vitals Time Taken: 8:57 AM, Height: 68 in, Weight: 285 lbs, BMI: 43.3, Temperature: 97.9 F, Pulse: 60 bpm, Respiratory Rate: 16 breaths/min, Blood Pressure: 124/60 mmHg. Eyes Conjunctivae clear. No discharge. Respiratory Respiratory effort is easy and symmetric bilaterally. Rate is normal at rest and on room air.. Bilateral breath sounds are clear and equal in all lobes with no wheezes, rales or rhonchi.. Cardiovascular Pansystolic murmur compatible with mitral regurgitation. Pedal pulses are easily palpable on the right and the left although his ABIs in the clinic were noncompressible. Bilateral lymphedema. Gregory [name], Gregory [name] ([id number]) Psychiatric No evidence of depression, anxiety, or agitation. Calm, cooperative, and communicative. Appropriate interactions and affect.. General Notes: Wound exam; on evaluation today the patient's wounds did not require debridement. The area on the left lateral calf looks healthy and appears to be epithelializing. He has open areas on the right anterior and right posterior calf. Especially anteriorly he  is weeping edema fluid. There is erythema on the right anterior part of this and also the dorsal foot but no palpable tenderness. Integumentary (Hair, Skin) Patient clearly has lymphedema in both legs weeping lymphedema fluid on the right. Wound #1 status is Open. Original cause of wound was Gradually Appeared. The wound is located on the Right,Circumferential Lower Leg.  The wound measures 14cm length x 25cm width x 0.1cm depth; 274.889cm^2 area and 27.489cm^3 volume. The wound is limited to skin breakdown. There is no tunneling or undermining noted. There is a large amount of serous drainage noted. The wound margin is indistinct and nonvisible. There is large (67-100%) red granulation within the wound bed. There is no necrotic tissue within the wound bed. The periwound skin appearance exhibited: Excoriation, Maceration, Hemosiderin Staining, Erythema. The periwound skin appearance did not exhibit: Callus, Crepitus, Induration, Rash, Scarring, Dry/Scaly, Atrophie Blanche, Cyanosis, Ecchymosis, Mottled, Pallor, Rubor. The surrounding wound skin color is noted with erythema. Periwound temperature was noted as No Abnormality. The periwound has tenderness on palpation. Wound #2 status is Open. Original cause of wound was Gradually Appeared. The wound is located on the Left,Lateral Lower Leg. The wound measures 0.9cm length x 0.7cm width x 0.1cm depth; 0.495cm^2 area and 0.049cm^3 volume. There is Fat Layer (Subcutaneous Tissue) Exposed exposed. There is no tunneling or undermining noted. There is a large amount of sanguinous drainage noted. The wound margin is flat and intact. There is large (67-100%) red granulation within the wound bed. There is no necrotic tissue within the wound bed. The periwound skin appearance exhibited: Hemosiderin Staining. The periwound skin appearance did not exhibit: Callus, Crepitus, Excoriation, Induration, Rash, Scarring, Dry/Scaly, Maceration, Atrophie Blanche,  Cyanosis, Ecchymosis, Mottled, Pallor, Rubor, Erythema. Periwound temperature was noted as No Abnormality. The periwound has tenderness on palpation. 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 Wound #2 Left,Lateral Lower Leg: May shower with protection. Skin Barriers/Peri-Wound Care: Wound #1 Right,Circumferential Lower Leg: Triamcinolone Acetonide Ointment (TCA) Primary Wound Dressing: Wound #1 Right,Circumferential Lower Leg: Silver Alginate XtraSorb Wound #2 Left,Lateral Lower Leg: Silver Alginate Secondary Dressing: Wound #2 Left,Lateral Lower Leg: Dry Gauze Dressing Change Frequency: Change Dressing Monday, Wednesday, Friday Follow-up Appointments: Wound #1 Right,Circumferential Lower Leg: Return Appointment in 1 week. Other: - Nurse Visit Monday, Wed, Fri Wound #2 Left,Lateral Lower Leg: Return Appointment in 1 week. Other: - Nurse Visit Monday, Wed, Fri Edema Control: Wound #1 Right,Circumferential Lower Leg: 3 Layer Compression System - Bilateral Elevate legs to the level of the heart and pump ankles as often as possible Wound #2 Left,Lateral Lower  Leg: 3 Layer Compression System - Bilateral Elevate legs to the level of the heart and pump ankles as often as possible 1. On the right leg a combination of lymphedema with chronic venous inflammation. I cannot rule out a component of cellulitis however he is already on Levaquin which should cover most identifiable causes of this. The major issue is the weeping edema fluid and we are going to have to put him in compression I chose 3 layer. Although his previous ABIs were noncompressible he has a vibrant lower extremity pulses. His daughter is a Marine scientist with MedSurg training and she  should be able to change his dressing tomorrow. We are going to use Liberal TCA/silver alginate size extra sorb under 3 layer compression. Daughter will change this tomorrow and will see him back in follow-up on Friday 2. I have marked the area of erythema on the right leg which is the biggest concern here. I have told them that any extension of the erythema, increase in pain and/or systemic symptoms would dictate a trip to the ER in the meantime. 3. I see no evidence to suggest a DVT 4. At this point I continue to think he is safe for outpatient management. I did not change the antibiotics. 5. Unfortunately was really difficult to compare this to anything since we did not seem to have pictures of the right leg and I heal although they were taken according to our nurses 6. I have advised him to take some time off work and keep his leg elevated. Electronic Signature(s) Gregory [name], Gregory [name] ([id number]) Signed: 01/22/2018 5:53:07 PM By: Linton Ham MD Entered By: Linton Ham on 01/22/2018 10:52:33 Gregory [name], Gregory [name] ([id number]) -------------------------------------------------------------------------------- SuperBill Details Patient Name: Gregory [name], Gregory [name] Date of Service: 01/22/2018 Medical Record Number: [id number] Patient Account Number: [id number] Date of Birth/Sex: Oct 15, 1950 (68 y.o. M) Treating RN:  Cornell Barman Primary Care Provider: [name] Other Clinician: Referring Provider: [name] Treating Provider/Extender: Ricard Dillon Weeks in Treatment: 0 Diagnosis Coding ICD-10 Codes Code Description E11.622 Type 2 diabetes mellitus with other skin ulcer I87.2 Venous insufficiency (chronic) (peripheral) L97.812 Non-pressure chronic ulcer of other part of right lower leg with fat layer exposed L97.822 Non-pressure chronic ulcer of other part of left lower leg with fat layer exposed I10 Essential (primary) hypertension L03.115 Cellulitis of right lower limb Facility Procedures CPT4: Description Modifier Quantity Code [id number] BILATERAL: Application of multi-layer venous compression system; leg (below 1 knee), including ankle and foot. Physician Procedures CPT4 Code Description: [id number] - WC PHYS LEVEL 3 - EST PT ICD-10 Diagnosis Description K82.060 Non-pressure chronic ulcer of other part of right lower leg w L97.822 Non-pressure chronic ulcer of other part of left lower leg wi L03.115 Cellulitis of  right lower limb I87.2 Venous insufficiency (chronic) (peripheral) Modifier: ith fat layer expo th fat layer expos Quantity: 1 sed ed Electronic Signature(s) Signed: 01/22/2018 12:52:45 PM By: Gretta Cool, BSN, RN, CWS, Kim RN, BSN Signed: 01/22/2018 5:53:07 PM By: Linton Ham MD Entered By: Gretta Cool, BSN, RN, CWS, Kim on 01/22/2018 12:52:44

## 2018-01-24 NOTE — Progress Notes (Signed)
Gregory [name], Gregory [name] ([id number]) Visit Report for 01/22/2018 Arrival Information Details Patient Name: Gregory [name], Gregory [name] Date of Service: 01/22/2018 8:30 AM Medical Record Number: [id number] Patient Account Number: [id number] Date of Birth/Sex: 03/25/1950 (68 y.o. M) Treating RN: Secundino Ginger Primary Care Celestine Bougie: [name] Other Clinician: Referring Babbie Dondlinger: [name] Treating Svea Pusch/Extender: Tito Dine in Treatment: 0 Visit Information History Since Last Visit Added or deleted any medications: No Patient Arrived: Ambulatory Any new allergies or adverse reactions: No Arrival Time: 08:46 Had a fall or experienced change in No Accompanied By: spouse activities of daily living that may affect Transfer Assistance: None risk of falls: Patient Identification Verified: Yes Signs or symptoms of abuse/neglect since last visito No Secondary Verification Process Completed: Yes Hospitalized since last visit: No Implantable device outside of the clinic excluding No cellular tissue based products placed in the center since last visit: Has Dressing in Place as Prescribed: Yes Has Compression in Place as Prescribed: Yes Pain Present Now: No Notes BP 124/60 HR 60 T 97.9 R 16 Electronic Signature(s) Signed: 01/22/2018 11:13:56 AM By: Secundino Ginger Entered By: Secundino Ginger on 01/22/2018 08:57:17 Gregory [name] ([id number]) -------------------------------------------------------------------------------- Encounter Discharge Information Details Patient Name: Gregory [name] Date of Service: 01/22/2018 8:30 AM Medical Record Number: [id number] Patient Account Number: [id number] Date of Birth/Sex: February 21, 1950 (68 y.o. M) Treating RN: Montey Hora Primary Care Savior Himebaugh: [name] Other Clinician: Referring Yahye Siebert: [name] Treating Efton Thomley/Extender: Tito Dine in Treatment: 0 Encounter Discharge Information Items Discharge Condition: Stable Ambulatory  Status: Ambulatory Discharge Destination: Home Transportation: Private Auto Accompanied By: spouse and dtr Schedule Follow-up Appointment: Yes Clinical Summary of Care: Electronic Signature(s) Signed: 01/22/2018 5:29:12 PM By: Montey Hora Entered By: Montey Hora on 01/22/2018 09:55:16 Gregory [name] ([id number]) -------------------------------------------------------------------------------- Lower Extremity Assessment Details Patient Name: Gregory [name] Date of Service: 01/22/2018 8:30 AM Medical Record Number: [id number] Patient Account Number: [id number] Date of Birth/Sex: 07/12/1950 (68 y.o. M) Treating RN: Secundino Ginger Primary Care Jock Mahon: [name] Other Clinician: Referring Ericha Whittingham: [name] Treating Kregg Cihlar/Extender: Tito Dine in Treatment: 0 Edema Assessment Assessed: [Left: No] [Right: No] [Left: Edema] [Right: :] Calf Left: Right: Point of Measurement: 32 cm From Medial Instep 45.6 cm 45.5 cm Ankle Left: Right: Point of Measurement: 12 cm From Medial Instep 30.2 cm 30.5 cm Vascular Assessment Pulses: Dorsalis Pedis Palpable: [Left:Yes] [Right:Yes] Posterior Tibial Extremity colors, hair growth, and conditions: Extremity Color: [Left:Normal] [Right:Red] Hair Growth on Extremity: [Left:Yes] [Right:Yes] Temperature of Extremity: [Left:Warm] [Right:Hot] Capillary Refill: [Left:< 3 seconds] [Right:< 3 seconds] Toe Nail Assessment Left: Right: Thick: Yes Yes Discolored: Yes Yes Deformed: No No Improper Length and Hygiene: Yes Yes Electronic Signature(s) Signed: 01/22/2018 11:13:56 AM By: Secundino Ginger Entered By: Secundino Ginger on 01/22/2018 09:03:17 Gregory [name] ([id number]) -------------------------------------------------------------------------------- Multi Wound Chart Details Patient Name: Gregory [name] Date of Service: 01/22/2018 8:30 AM Medical Record Number: [id number] Patient Account Number: [id number] Date of  Birth/Sex: 1950-01-12 (68 y.o. M) Treating RN: Cornell Barman Primary Care Hermen Mario: [name] Other Clinician: Referring Malley Hauter: [name] Treating Xadrian Craighead/Extender: Tito Dine in Treatment: 0 Vital Signs Height(in): 67 Pulse(bpm): 60 Weight(lbs): 285 Blood Pressure(mmHg): 124/60 Body Mass Index(BMI): 43 Temperature(F): 97.9 Respiratory Rate 16 (breaths/min): Photos: [N/A:N/A] Wound Location: Right Lower Leg - Left Lower Leg - Lateral N/A Circumfernential Wounding Event: Gradually Appeared Gradually Appeared N/A Primary Etiology: Venous Leg Ulcer Venous Leg Ulcer N/A Comorbid History: Cataracts, Hypertension, Type Cataracts, Hypertension, Type N/A II Diabetes II Diabetes  Date Acquired: 12/03/2017 12/30/2017 N/A Weeks of Treatment: 0 0 N/A Wound Status: Open Open N/A Measurements L x W x D 14x25x0.1 0.9x0.7x0.1 N/A (cm) Area (cm) : 274.889 0.495 N/A Volume (cm) : 27.489 0.049 N/A % Reduction in Area: -9.90% 35.70% N/A % Reduction in Volume: -9.90% 36.40% N/A Classification: Partial Thickness Full Thickness Without N/A Exposed Support Structures Exudate Amount: Large Large N/A Exudate Type: Serous Sanguinous N/A Exudate Color: amber red N/A LARONN, Gregory [name] ([id number]) Wound Margin: Indistinct, nonvisible Flat and Intact N/A Granulation Amount: Large (67-100%) Large (67-100%) N/A Granulation Quality: Red Red N/A Necrotic Amount: None Present (0%) None Present (0%) N/A Exposed Structures: Fascia: No Fat Layer (Subcutaneous N/A Fat Layer (Subcutaneous Tissue) Exposed: Yes Tissue) Exposed: No Fascia: No Tendon: No Tendon: No Muscle: No Muscle: No Joint: No Joint: No Bone: No Bone: No Limited to Skin Breakdown Epithelialization: Medium (34-66%) Small (1-33%) N/A Periwound Skin Texture: Excoriation: Yes Excoriation: No N/A Induration: No Induration: No Callus: No Callus: No Crepitus: No Crepitus: No Rash: No Rash: No Scarring:  No Scarring: No Periwound Skin Moisture: Maceration: Yes Maceration: No N/A Dry/Scaly: No Dry/Scaly: No Periwound Skin Color: Erythema: Yes Hemosiderin Staining: Yes N/A Hemosiderin Staining: Yes Atrophie Blanche: No Atrophie Blanche: No Cyanosis: No Cyanosis: No Ecchymosis: No Ecchymosis: No Erythema: No Mottled: No Mottled: No Pallor: No Pallor: No Rubor: No Rubor: No Temperature: No Abnormality No Abnormality N/A Tenderness on Palpation: Yes Yes N/A Wound Preparation: Ulcer Cleansing: Other: soap Ulcer Cleansing: Other: soap N/A and water and water Topical Anesthetic Applied: Topical Anesthetic Applied: None None Treatment Notes Wound #1 (Right, Circumferential Lower Leg) Notes silvercel to both wounds, xtrasorb and abd to right leg with bilateral kerlix and coban wrap with unna to anchor Wound #2 (Left, Lateral Lower Leg) Notes silvercel to both wounds, xtrasorb and abd with bilateral 3 layer wrap with unna to anchor Electronic Signature(s) Signed: 01/22/2018 5:53:07 PM By: Linton Ham MD Entered By: Linton Ham on 01/22/2018 10:23:29 Gregory [name] ([id number]) -------------------------------------------------------------------------------- Multi-Disciplinary Care Plan Details Patient Name: OSMANY, AZER. Date of Service: 01/22/2018 8:30 AM Medical Record Number: [id number] Patient Account Number: [id number] Date of Birth/Sex: 1950/10/11 (67 y.o. M) Treating RN: Cornell Barman Primary Care Judeth Gilles: [name] Other Clinician: Referring Haasini Patnaude: [name] Treating Levia Waltermire/Extender: Tito Dine in Treatment: 0 Active Inactive Soft Tissue Infection Nursing Diagnoses: Impaired tissue integrity Potential for infection: soft tissue Goals: Patient's soft tissue infection will resolve Date Initiated: 01/22/2018 Target Resolution Date: 01/27/2018 Goal Status: Active Interventions: Assess signs and symptoms of infection every  visit Treatment Activities: Systemic antibiotics : 01/22/2018 Notes: Wound/Skin Impairment Nursing Diagnoses: Impaired tissue integrity Knowledge deficit related to ulceration/compromised skin integrity Goals: Ulcer/skin breakdown will have a volume reduction of 30% by week 4 Date Initiated: 01/17/2018 Target Resolution Date: 02/17/2018 Goal Status: Active Interventions: Assess patient/caregiver ability to obtain necessary supplies Assess patient/caregiver ability to perform ulcer/skin care regimen upon admission and as needed Assess ulceration(s) every visit Notes: Electronic Signature(s) Signed: 01/23/2018 5:43:44 PM By: Gretta Cool, BSN, RN, CWS, Kim RN, BSN Entered By: Gretta Cool, BSN, RN, CWS, Kim on 01/22/2018 09:21:21 ANIAS, BARTOL ([id number], BRUMETT ([id number]) -------------------------------------------------------------------------------- Pain Assessment Details Patient Name: FISCHER, HALLEY. Date of Service: 01/22/2018 8:30 AM Medical Record Number: [id number] Patient Account Number: [id number] Date of Birth/Sex: December 07, 1950 (67 y.o. M) Treating RN: Secundino Ginger Primary Care Miku Udall: [name] Other Clinician: Referring Vineeth Fell: [name] Treating Honesti Seaberg/Extender: Tito Dine in Treatment: 0 Active Problems Location  of Pain Severity and Description of Pain Patient Has Paino No Site Locations Pain Management and Medication Current Pain Management: Electronic Signature(s) Signed: 01/22/2018 11:13:56 AM By: Secundino Ginger Entered By: Secundino Ginger on 01/22/2018 08:57:23 Gregory [name] ([id number]) -------------------------------------------------------------------------------- Patient/Caregiver Education Details Patient Name: ASHAR, LEWINSKI. Date of Service: 01/22/2018 8:30 AM Medical Record Number: [id number] Patient Account Number: [id number] Date of Birth/Gender: 09/24/1950 (68 y.o. M) Treating RN: Montey Hora Primary Care Physician:  [name] Other Clinician: Referring Physician: [name] Treating Physician/Extender: Tito Dine in Treatment: 0 Education Assessment Education Provided To: Patient and Caregiver Education Topics Provided Venous: Handouts: Other: how to wrap using 3 layer compression Methods: Demonstration, Explain/Verbal Responses: State content correctly Electronic Signature(s) Signed: 01/22/2018 5:29:12 PM By: Montey Hora Entered By: Montey Hora on 01/22/2018 09:55:37 Gregory [name] ([id number]) -------------------------------------------------------------------------------- Wound Assessment Details Patient Name: Gregory [name] Date of Service: 01/22/2018 8:30 AM Medical Record Number: [id number] Patient Account Number: [id number] Date of Birth/Sex: 1950-07-03 (67 y.o. M) Treating RN: Secundino Ginger Primary Care Temesgen Weightman: [name] Other Clinician: Referring Singleton Hickox: [name] Treating Caylin Raby/Extender: Tito Dine in Treatment: 0 Wound Status Wound Number: 1 Primary Etiology: Venous Leg Ulcer Wound Location: Right Lower Leg - Circumfernential Wound Status: Open Wounding Event: Gradually Appeared Comorbid History: Cataracts, Hypertension, Type II Diabetes Date Acquired: 12/03/2017 Weeks Of Treatment: 0 Clustered Wound: No Photos Photo Uploaded By: Montey Hora on 01/22/2018 09:11:23 Wound Measurements Length: (cm) 14 Width: (cm) 25 Depth: (cm) 0.1 Area: (cm) 274.889 Volume: (cm) 27.489 % Reduction in Area: -9.9% % Reduction in Volume: -9.9% Epithelialization: Medium (34-66%) Tunneling: No Undermining: No Wound Description Classification: Partial Thickness Wound Margin: Indistinct, nonvisible Exudate Amount: Large Exudate Type: Serous Exudate Color: amber Foul Odor After Cleansing: No Slough/Fibrino No Wound Bed Granulation Amount: Large (67-100%) Exposed Structure Granulation Quality: Red Fascia Exposed: No Necrotic  Amount: None Present (0%) Fat Layer (Subcutaneous Tissue) Exposed: No Tendon Exposed: No Muscle Exposed: No Joint Exposed: No Bone Exposed: No Limited to Skin Breakdown Periwound Skin Texture LEVERNE, AMRHEIN ([id number]) Texture Color No Abnormalities Noted: No No Abnormalities Noted: No Callus: No Atrophie Blanche: No Crepitus: No Cyanosis: No Excoriation: Yes Ecchymosis: No Induration: No Erythema: Yes Rash: No Hemosiderin Staining: Yes Scarring: No Mottled: No Pallor: No Moisture Rubor: No No Abnormalities Noted: No Dry / Scaly: No Temperature / Pain Maceration: Yes Temperature: No Abnormality Tenderness on Palpation: Yes Wound Preparation Ulcer Cleansing: Other: soap and water, Topical Anesthetic Applied: None Electronic Signature(s) Signed: 01/22/2018 11:13:56 AM By: Secundino Ginger Entered By: Secundino Ginger on 01/22/2018 09:00:51 Gregory [name] ([id number]) -------------------------------------------------------------------------------- Wound Assessment Details Patient Name: Gregory [name] Date of Service: 01/22/2018 8:30 AM Medical Record Number: [id number] Patient Account Number: [id number] Date of Birth/Sex: November 15, 1950 (67 y.o. M) Treating RN: Secundino Ginger Primary Care Liara Holm: [name] Other Clinician: Referring Satia Winger: [name] Treating Teeghan Hammer/Extender: Tito Dine in Treatment: 0 Wound Status Wound Number: 2 Primary Etiology: Venous Leg Ulcer Wound Location: Left Lower Leg - Lateral Wound Status: Open Wounding Event: Gradually Appeared Comorbid History: Cataracts, Hypertension, Type II Diabetes Date Acquired: 12/30/2017 Weeks Of Treatment: 0 Clustered Wound: No Photos Photo Uploaded By: Montey Hora on 01/22/2018 09:10:57 Wound Measurements Length: (cm) 0.9 Width: (cm) 0.7 Depth: (cm) 0.1 Area: (cm) 0.495 Volume: (cm) 0.049 % Reduction in Area: 35.7% % Reduction in Volume: 36.4% Epithelialization: Small  (1-33%) Tunneling: No Undermining: No Wound Description Full Thickness Without Exposed Support Classification: Structures Wound Margin: Flat and  Intact Exudate Large Amount: Exudate Type: Sanguinous Exudate Color: red Foul Odor After Cleansing: No Slough/Fibrino No Wound Bed Granulation Amount: Large (67-100%) Exposed Structure Granulation Quality: Red Fascia Exposed: No Necrotic Amount: None Present (0%) Fat Layer (Subcutaneous Tissue) Exposed: Yes Tendon Exposed: No Muscle Exposed: No Joint Exposed: No Bone Exposed: No TANDY, LEWIN ([id number]) Periwound Skin Texture Texture Color No Abnormalities Noted: No No Abnormalities Noted: No Callus: No Atrophie Blanche: No Crepitus: No Cyanosis: No Excoriation: No Ecchymosis: No Induration: No Erythema: No Rash: No Hemosiderin Staining: Yes Scarring: No Mottled: No Pallor: No Moisture Rubor: No No Abnormalities Noted: No Dry / Scaly: No Temperature / Pain Maceration: No Temperature: No Abnormality Tenderness on Palpation: Yes Wound Preparation Ulcer Cleansing: Other: soap and water, Topical Anesthetic Applied: None Electronic Signature(s) Signed: 01/22/2018 11:13:56 AM By: Secundino Ginger Entered By: Secundino Ginger on 01/22/2018 08:59:06 Gregory [name] ([id number]) -------------------------------------------------------------------------------- Vitals Details Patient Name: Gregory [name] Date of Service: 01/22/2018 8:30 AM Medical Record Number: [id number] Patient Account Number: [id number] Date of Birth/Sex: [birth year] (67 y.o. M) Treating RN: Secundino Ginger Primary Care Elyssia Strausser: [name] Other Clinician: Referring Johnattan Strassman: [name] Treating Ardon Franklin/Extender: Tito Dine in Treatment: 0 Vital Signs Time Taken: 08:57 Temperature (F): 97.9 Height (in): 68 Pulse (bpm): 60 Weight (lbs): 285 Respiratory Rate (breaths/min): 16 Body Mass Index (BMI): 43.3 Blood Pressure (mmHg):  124/60 Reference Range: 80 - 120 mg / dl Electronic Signature(s) Signed: 01/22/2018 11:13:56 AM By: Secundino Ginger Entered BySecundino Ginger on 01/22/2018 08:57:47

## 2018-01-25 NOTE — Progress Notes (Signed)
Gregory [name] ([id number]) Visit Report for 01/24/2018 Chief Complaint Document Details Patient Name: Gregory [name] Date of Service: 01/24/2018 9:00 AM Medical Record Number: [id number] Patient Account Number: [id number] Date of Birth/Sex: 20-Nov-1950 (67 y.o. M) Treating RN: Montey Hora Primary Care Provider: [name] Other Clinician: Referring Provider: [name] Treating Provider/Extender: Melburn Hake, HOYT Weeks in Treatment: 1 Information Obtained from: Patient Chief Complaint Bilateral LE ulcers Electronic Signature(s) Signed: 01/24/2018 5:23:14 PM By: Worthy Keeler PA-C Entered By: Worthy Keeler on 01/24/2018 09:32:26 Gregory [name] ([id number]) -------------------------------------------------------------------------------- HPI Details Patient Name: Gregory [name] Date of Service: 01/24/2018 9:00 AM Medical Record Number: [id number] Patient Account Number: [id number] Date of Birth/Sex: 04/17/1950 (67 y.o. M) Treating RN: Montey Hora Primary Care Provider: [name] Other Clinician: Referring Provider: [name] Treating Provider/Extender: Melburn Hake, HOYT Weeks in Treatment: 1 History of Present Illness HPI Description: 01/17/18 on evaluation today patient presents for initial inspection concerning wounds that he has of the bilateral lower extremities. This has been an intermittent issue for him and he states that he's had swelling for at least a couple of years that he can remember. 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 He does have a history of hypertension as well as diabetes mellitus type II. His hemoglobin A1c was 8.5 on 01/14/18. Currently he has been putting antibiotic ointment on it and in regard to the right lower extremity using a telfa pad which you can see the outline of where there  is some maceration and skin breakdown. The weeping of the right lower extremity is more circumferential in nature. He is not currently on any oral antibiotic therapy. No fevers, chills, nausea, or vomiting noted at this time. 1/22; the patient was here for a nurse visit today but I was asked to look at the patient because of concerns raised by his wife and daughter who are present. The patient is on Levaquin based on concern of cellulitis in the right lower leg. This was apparently started on Monday. His daughter and wife are concerned about the increasing erythema predominantly on the anterior foot and anterior leg. There does not appear to be a lot of pain here but there is weeping lymphedema fluid coming out of the anterior leg with loss of epithelialization. The entire area looks somewhat angry but there is no palpable tenderness. There is extensive damage on the posterior calf as well. On the left leg he has a small wound that looks improved. The patient has not been systemically unwell no fever no chills. He worked a full day Designer, fashion/clothing I believe]. His daughter is actually a nurse with previous MedSurg training and should be able to change his dressings 01/24/18 on evaluation today patient appears to be doing better in my pinion in regard to his bilateral lower extremity ones/cellulitis. With that being said it sounds like when he came in first nurse visit on Wednesday things were not doing nearly as well as what they are today. In fact his wife states that it is much improved today compared to the way things appeared at that point. Nonetheless they were very worried in fact she tells me that she was "scared". His daughter who is a nurse has actually changed his dressing yesterday for him including the three layer compression wrap. She appeared to do a good job  with this which is excellent news. With that being said we did order wrap supplies for them to have it home just  so they can keep a close eye on this over the next several days as well. Fortunately there is no sign of systemic infection at this time which is good news. The erythema never spread out of the marked locations that we marked last Wednesday. Electronic Signature(s) Signed: 01/24/2018 5:23:14 PM By: Worthy Keeler PA-C Entered By: Worthy Keeler on 01/24/2018 11:30:50 Gregory [name] ([id number]) -------------------------------------------------------------------------------- Physical Exam Details Patient Name: Gregory [name] Date of Service: 01/24/2018 9:00 AM Medical Record Number: [id number] Patient Account Number: [id number] Date of Birth/Sex: [birth year] (67 y.o. M) Treating RN: Montey Hora Primary Care Provider: [name] Other Clinician: Referring Provider: [name] Treating Provider/Extender: Melburn Hake, HOYT Weeks in Treatment: 1 Constitutional Well-nourished and well-hydrated in no acute distress. Respiratory normal breathing without difficulty. clear to auscultation bilaterally. Cardiovascular regular rate and rhythm with normal S1, S2. 2+ dorsalis pedis/posterior tibialis pulses. 1+ pitting edema of the bilateral lower extremities. Psychiatric this patient is able to make decisions and demonstrates good insight into disease process. Alert and Oriented x 3. pleasant and cooperative. Notes Patient's wound bed currently shows signs of good granulation at this time which is excellent news. Fortunately there does not appear to be any evidence of infection at this point also good news that is systemically. Locally he still has erythema of the right lower extremity really none in the left this does have me concerned about whether or not the Levaquin is still doing the job like you wanted to or if we should potentially add something else to aid in resolution. After discussing with the family and patient today my suggestion was that we go ahead and proceed with adding  Keflex to his regimen to be on the safe side. They will keep a close eye on the theme of the right lower extremity in particular. Electronic Signature(s) Signed: 01/24/2018 5:23:14 PM By: Worthy Keeler PA-C Entered By: Worthy Keeler on 01/24/2018 11:31:53 Gregory [name] ([id number]) -------------------------------------------------------------------------------- Physician Orders Details Patient Name: Gregory [name] Date of Service: 01/24/2018 9:00 AM Medical Record Number: [id number] Patient Account Number: [id number] Date of Birth/Sex: 1950/08/16 (67 y.o. M) Treating RN: Montey Hora Primary Care Provider: [name] Other Clinician: Referring Provider: [name] Treating Provider/Extender: Melburn Hake, HOYT Weeks in Treatment: 1 Verbal / Phone Orders: No Diagnosis Coding ICD-10 Coding Code Description E11.622 Type 2 diabetes mellitus with other skin ulcer I87.2 Venous insufficiency (chronic) (peripheral) L97.812 Non-pressure chronic ulcer of other part of right lower leg with fat layer exposed L97.822 Non-pressure chronic ulcer of other part of left lower leg with fat layer exposed I10 Essential (primary) hypertension L03.115 Cellulitis of right lower limb Wound Cleansing Wound #1 Right,Circumferential Lower Leg o May shower with protection. Wound #2 Left,Lateral Lower Leg o May shower with protection. Skin Barriers/Peri-Wound Care Wound #1 Right,Circumferential Lower Leg o Moisturizing lotion Wound #2 Left,Lateral Lower Leg o Moisturizing lotion Primary Wound Dressing Wound #2 Left,Lateral Lower Leg o Silver Alginate Secondary Dressing Wound #1 Right,Circumferential Lower Leg o ABD pad Wound #2 Left,Lateral Lower Leg o Dry Gauze Dressing Change Frequency Wound #1 Right,Circumferential Lower Leg o Change dressing every day. Wound #2 Left,Lateral Lower Leg o Change dressing every week Gregory [name], Gregory [name] ([id number]) Follow-up  Appointments Wound #1 Right,Circumferential Lower Leg o Return Appointment in 1 week. - Monday 01/27/18 Wound #2 Left,Lateral Lower Leg o Return  Appointment in 1 week. - Monday 01/27/18 Edema Control Wound #1 Right,Circumferential Lower Leg o 3 Layer Compression System - Bilateral o Elevate legs to the level of the heart and pump ankles as often as possible Wound #2 Left,Lateral Lower Leg o 3 Layer Compression System - Bilateral o Elevate legs to the level of the heart and pump ankles as often as possible Patient Medications Allergies: No Known Allergies Notifications Medication Indication Start End Keflex 01/24/2018 DOSE 1 - oral 500 mg capsule - 1 capsule oral taken 3 times a day for 10 days Electronic Signature(s) Signed: 01/24/2018 10:08:38 AM By: Worthy Keeler PA-C Entered By: Worthy Keeler on 01/24/2018 10:08:37 Gregory [name] ([id number]) -------------------------------------------------------------------------------- Problem List Details Patient Name: Gregory [name] Date of Service: 01/24/2018 9:00 AM Medical Record Number: [id number] Patient Account Number: [id number] Date of Birth/Sex: 1950/11/19 (67 y.o. M) Treating RN: Montey Hora Primary Care Provider: [name] Other Clinician: Referring Provider: [name] Treating Provider/Extender: Worthy Keeler Weeks in Treatment: 1 Active Problems ICD-10 Evaluated Encounter Code Description Active Date Today Diagnosis E11.622 Type 2 diabetes mellitus with other skin ulcer 01/17/2018 No Yes I87.2 Venous insufficiency (chronic) (peripheral) 01/17/2018 No Yes L97.812 Non-pressure chronic ulcer of other part of right lower leg 01/17/2018 No Yes with fat layer exposed L97.822 Non-pressure chronic ulcer of other part of left lower leg with 01/17/2018 No Yes fat layer exposed [place] (primary) hypertension 01/17/2018 No Yes L03.115 Cellulitis of right lower limb 01/22/2018 No Yes Inactive  Problems Resolved Problems Electronic Signature(s) Signed: 01/24/2018 5:23:14 PM By: Worthy Keeler PA-C Entered By: Worthy Keeler on 01/24/2018 09:32:21 Gregory [name] ([id number]) -------------------------------------------------------------------------------- Progress Note Details Patient Name: Gregory [name] Date of Service: 01/24/2018 9:00 AM Medical Record Number: [id number] Patient Account Number: [id number] Date of Birth/Sex: 1950-05-25 (67 y.o. M) Treating RN: Montey Hora Primary Care Provider: [name] Other Clinician: Referring Provider: [name] Treating Provider/Extender: Melburn Hake, HOYT Weeks in Treatment: 1 Subjective Chief Complaint Information obtained from Patient Bilateral LE ulcers History of Present Illness (HPI) 01/17/18 on evaluation today patient presents for initial inspection concerning wounds that he has of the bilateral lower extremities. This has been an intermittent issue for him and he states that he's had swelling for at least a couple of years that he can remember. 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 He does have a history of hypertension as well as diabetes mellitus type II. His hemoglobin A1c was 8.5 on 01/14/18. Currently he has been putting antibiotic ointment on it and in regard to the right lower extremity using a telfa pad which you can see the outline of where there is some maceration and skin breakdown. The weeping of the right lower extremity is more circumferential in nature. He is not currently on any oral antibiotic therapy. No fevers, chills, nausea, or vomiting noted at this time. 1/22; the patient was here for a nurse visit today but I was asked to look at the patient because of concerns raised by his wife and daughter who are present. The patient is on Levaquin based  on concern of cellulitis in the right lower leg. This was apparently started on Monday. His daughter and wife are concerned about the increasing erythema predominantly on the anterior foot and anterior leg. There does not appear to be a lot of  pain here but there is weeping lymphedema fluid coming out of the anterior leg with loss of epithelialization. The entire area looks somewhat angry but there is no palpable tenderness. There is extensive damage on the posterior calf as well. On the left leg he has a small wound that looks improved. The patient has not been systemically unwell no fever no chills. He worked a full day Designer, fashion/clothing I believe]. His daughter is actually a nurse with previous MedSurg training and should be able to change his dressings 01/24/18 on evaluation today patient appears to be doing better in my pinion in regard to his bilateral lower extremity ones/cellulitis. With that being said it sounds like when he came in first nurse visit on Wednesday things were not doing nearly as well as what they are today. In fact his wife states that it is much improved today compared to the way things appeared at that point. Nonetheless they were very worried in fact she tells me that she was "scared". His daughter who is a nurse has actually changed his dressing yesterday for him including the three layer compression wrap. She appeared to do a good job with this which is excellent news. With that being said we did order wrap supplies for them to have it home just so they can keep a close eye on this over the next several days as well. Fortunately there is no sign of systemic infection at this time which is good news. The erythema never spread out of the marked locations that we marked last Wednesday. Patient History Information obtained from Patient. Family History Cancer - Siblings,Mother, No family history of Diabetes, Heart Disease, Hereditary Spherocytosis,  Hypertension, Kidney Disease, Lung Disease, Seizures, Stroke, Thyroid Problems, Tuberculosis. Social History Never smoker, Marital Status - Married, Alcohol Use - Moderate, Drug Use - No History, Caffeine Use - Daily. Medical And Surgical History Notes Cardiovascular Gregory [name], Gregory [name] ([id number]) aortic insufficiency Review of Systems (ROS) Constitutional Symptoms (General Health) Denies complaints or symptoms of Fever, Chills. Respiratory The patient has no complaints or symptoms. Cardiovascular Complains or has symptoms of LE edema. Psychiatric The patient has no complaints or symptoms. Objective Constitutional Well-nourished and well-hydrated in no acute distress. Vitals Time Taken: 9:04 AM, Height: 68 in, Weight: 285 lbs, BMI: 43.3, Temperature: 97.6 F, Pulse: 61 bpm, Respiratory Rate: 16 breaths/min, Blood Pressure: 121/58 mmHg. Respiratory normal breathing without difficulty. clear to auscultation bilaterally. Cardiovascular regular rate and rhythm with normal S1, S2. 2+ dorsalis pedis/posterior tibialis pulses. 1+ pitting edema of the bilateral lower extremities. Psychiatric this patient is able to make decisions and demonstrates good insight into disease process. Alert and Oriented x 3. pleasant and cooperative. General Notes: Patient's wound bed currently shows signs of good granulation at this time which is excellent news. Fortunately there does not appear to be any evidence of infection at this point also good news that is systemically. Locally he still has erythema of the right lower extremity really none in the left this does have me concerned about whether or not the Levaquin is still doing the job like you wanted to or if we should potentially add something else to aid in resolution. After discussing with the family and patient today my suggestion was that we go ahead and proceed with adding Keflex to his regimen to be on the safe side. They will keep a close eye  on the theme of the right lower extremity in particular. Integumentary (Hair, Skin) Wound #1 status is Open.  Original cause of wound was Gradually Appeared. The wound is located on the Right,Circumferential Lower Leg. The wound measures 0.1cm length x 0.1cm width x 0.1cm depth; 0.008cm^2 area and 0.001cm^3 volume. The wound is limited to skin breakdown. There is no tunneling or undermining noted. There is a none present amount of drainage noted. The wound margin is indistinct and nonvisible. There is no granulation within the wound bed. There is no necrotic tissue within the wound bed. The periwound skin appearance exhibited: Excoriation, Maceration, Hemosiderin Staining, Erythema. The periwound skin appearance did not exhibit: Callus, Crepitus, Induration, Rash, Scarring, Dry/Scaly, Atrophie Blanche, Cyanosis, Ecchymosis, Mottled, Pallor, Rubor. The surrounding wound skin color is noted with erythema. Periwound temperature was noted as No Abnormality. The periwound has tenderness on palpation. Wound #2 status is Open. Original cause of wound was Gradually Appeared. The wound is located on the [place] J. ([id number]) Leg. The wound measures 0.9cm length x 0.7cm width x 0.1cm depth; 0.495cm^2 area and 0.049cm^3 volume. There is Fat Layer (Subcutaneous Tissue) Exposed exposed. There is no tunneling or undermining noted. There is a small amount of serous drainage noted. The wound margin is flat and intact. There is large (67-100%) red granulation within the wound bed. There is no necrotic tissue within the wound bed. The periwound skin appearance exhibited: Hemosiderin Staining. The periwound skin appearance did not exhibit: Callus, Crepitus, Excoriation, Induration, Rash, Scarring, Dry/Scaly, Maceration, Atrophie Blanche, Cyanosis, Ecchymosis, Mottled, Pallor, Rubor, Erythema. Periwound temperature was noted as No Abnormality. The periwound has tenderness on  palpation. Assessment Active Problems ICD-10 Type 2 diabetes mellitus with other skin ulcer Venous insufficiency (chronic) (peripheral) Non-pressure chronic ulcer of other part of right lower leg with fat layer exposed Non-pressure chronic ulcer of other part of left lower leg with fat layer exposed Essential (primary) hypertension Cellulitis of right lower limb Procedures Wound #1 Pre-procedure diagnosis of Wound #1 is a Venous Leg Ulcer located on the Right,Circumferential Lower Leg . There was a Three Layer Compression Therapy Procedure by Montey Hora, RN. Post procedure Diagnosis Wound #1: Same as Pre-Procedure Wound #2 Pre-procedure diagnosis of Wound #2 is a Venous Leg Ulcer located on the Left,Lateral Lower Leg . There was a Three Layer Compression Therapy Procedure by Montey Hora, RN. Post procedure Diagnosis Wound #2: Same as Pre-Procedure Plan Wound Cleansing: Wound #1 Right,Circumferential Lower Leg: May shower with protection. Wound #2 Left,Lateral Lower Leg: May shower with protection. Skin Barriers/Peri-Wound Care: Wound #1 Right,Circumferential Lower Leg: Moisturizing lotion Wound #2 Left,Lateral Lower Leg: Moisturizing lotion Gregory [name], Gregory [name] ([id number]) Primary Wound Dressing: Wound #2 Left,Lateral Lower Leg: Silver Alginate Secondary Dressing: Wound #1 Right,Circumferential Lower Leg: ABD pad Wound #2 Left,Lateral Lower Leg: Dry Gauze Dressing Change Frequency: Wound #1 Right,Circumferential Lower Leg: Change dressing every day. Wound #2 Left,Lateral Lower Leg: Change dressing every week Follow-up Appointments: Wound #1 Right,Circumferential Lower Leg: Return Appointment in 1 week. - Monday 01/27/18 Wound #2 Left,Lateral Lower Leg: Return Appointment in 1 week. - Monday 01/27/18 Edema Control: Wound #1 Right,Circumferential Lower Leg: 3 Layer Compression System - Bilateral Elevate legs to the level of the heart and pump ankles as often as  possible Wound #2 Left,Lateral Lower Leg: 3 Layer Compression System - Bilateral Elevate legs to the level of the heart and pump ankles as often as possible The following medication(s) was prescribed: Keflex oral 500 mg capsule 1 1 capsule oral taken 3 times a day for 10 days starting 01/24/2018 At this point since the patient seems  to be doing so well I am going to suggest we continue the Levaquin I will add the Keflex but I do not think there's any need for him to go to the ER for further evaluation at this moment. With that being said if he is not continuing to show signs of improvement or special if anything worsens included fever, chills, nausea, vomiting, or diarrhea and I would want him to go to the ER as soon as possible. This was discussed with patient in the present of his daughter and wife today. He's in agreement with this plan. Otherwise I did send in the Flex for him to his pharmacy of choice [facility]. I will see him back for reevaluation on Monday to ensure things appear to be doing well and that there is no sign of worsening. Again my biggest concern is this infection getting worse and causing him to become septic. Electronic Signature(s) Signed: 01/24/2018 5:23:14 PM By: Worthy Keeler PA-C Entered By: Worthy Keeler on 01/24/2018 11:33:03 Gregory [name] ([id number]) -------------------------------------------------------------------------------- ROS/PFSH Details Patient Name: Gregory [name] Date of Service: 01/24/2018 9:00 AM Medical Record Number: [id number] Patient Account Number: [id number] Date of Birth/Sex: 1950/06/20 (67 y.o. M) Treating RN: Montey Hora Primary Care Provider: [name] Other Clinician: Referring Provider: [name] Treating Provider/Extender: Melburn Hake, HOYT Weeks in Treatment: 1 Information Obtained From Patient Wound History Do you currently have one or more open woundso Yes How many open wounds do you currently  haveo 2 Approximately how long have you had your woundso 6 weeks How have you been treating your wound(s) until nowo dry bandage and compression socks Has your wound(s) ever healed and then re-openedo No Have you had any lab work done in the past montho No Have you tested positive for an antibiotic resistant organism (MRSA, VRE)o No Have you tested positive for osteomyelitis (bone infection)o No Have you had any tests for circulation on your legso No Have you had other problems associated with your woundso Swelling Constitutional Symptoms (General Health) Complaints and Symptoms: Negative for: Fever; Chills Cardiovascular Complaints and Symptoms: Positive for: LE edema Medical History: Positive for: Hypertension Negative for: Angina; Arrhythmia; Congestive Heart Failure; Coronary Artery Disease; Deep Vein Thrombosis; Hypotension; Myocardial Infarction; Peripheral Arterial Disease; Peripheral Venous Disease; Phlebitis; Vasculitis Past Medical History Notes: aortic insufficiency Eyes Medical History: Positive for: Cataracts - left; right removed Negative for: Glaucoma; Optic Neuritis Ear/Nose/Mouth/Throat Medical History: Negative for: Chronic sinus problems/congestion; Middle ear problems Hematologic/Lymphatic Medical History: Negative for: Anemia; Hemophilia; Human Immunodeficiency Virus; Lymphedema; Sickle Cell Disease Respiratory Gregory [name], Gregory [name] ([id number]) Complaints and Symptoms: No Complaints or Symptoms Medical History: Negative for: Aspiration; Asthma; Chronic Obstructive Pulmonary Disease (COPD); Pneumothorax; Sleep Apnea; Tuberculosis Gastrointestinal Medical History: Negative for: Cirrhosis ; Colitis; Crohnos; Hepatitis A; Hepatitis B; Hepatitis C Endocrine Medical History: Positive for: Type II Diabetes - borderline Negative for: Type I Diabetes Genitourinary Medical History: Negative for: End Stage Renal Disease Immunological Medical History: Negative  for: Lupus Erythematosus; Raynaudos; Scleroderma Integumentary (Skin) Medical History: Negative for: History of Burn; History of pressure wounds Musculoskeletal Medical History: Negative for: Gout; Rheumatoid Arthritis; Osteoarthritis; Osteomyelitis Neurologic Medical History: Negative for: Dementia; Neuropathy; Quadriplegia; Paraplegia; Seizure Disorder Oncologic Medical History: Negative for: Received Chemotherapy; Received Radiation Psychiatric Complaints and Symptoms: No Complaints or Symptoms Medical History: Negative for: Anorexia/bulimia; Confinement Anxiety HBO Extended History Items Gregory [name], Gregory [name] ([id number]) Eyes: Cataracts Immunizations Pneumococcal Vaccine: Received Pneumococcal Vaccination: No Implantable Devices Family and Social History Cancer: Yes -  Siblings,Mother; Diabetes: No; Heart Disease: No; Hereditary Spherocytosis: No; Hypertension: No; Kidney Disease: No; Lung Disease: No; Seizures: No; Stroke: No; Thyroid Problems: No; Tuberculosis: No; Never smoker; Marital Status - Married; Alcohol Use: Moderate; Drug Use: No History; Caffeine Use: Daily; Financial Concerns: No; Food, Clothing or Shelter Needs: No; Support System Lacking: No; Transportation Concerns: No; Advanced Directives: No; Patient does not want information on Advanced Directives Physician Affirmation I have reviewed and agree with the above information. Electronic Signature(s) Signed: 01/24/2018 3:43:47 PM By: Montey Hora Signed: 01/24/2018 5:23:14 PM By: Worthy Keeler PA-C Entered By: Worthy Keeler on 01/24/2018 11:31:12 Gregory [name], Gregory [name] ([id number]) -------------------------------------------------------------------------------- SuperBill Details Patient Name: Gregory [name] Date of Service: 01/24/2018 Medical Record Number: [id number] Patient Account Number: [id number] Date of Birth/Sex: 1950/03/01 (68 y.o. 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 Physician Procedures CPT4 Code Description: 8264158 [id number] - WC PHYS LEVEL 4 - EST PT ICD-10 Diagnosis Description E11.622 Type 2 diabetes mellitus with other skin ulcer I87.2 Venous insufficiency (chronic) (peripheral) L97.822 Non-pressure chronic ulcer of other part of left  lower leg wi L97.812 Non-pressure chronic ulcer of other part of right lower leg w Modifier: th fat layer expos ith fat layer expo Quantity: 1 ed sed Electronic Signature(s) Signed: 01/24/2018 5:23:14 PM By: Worthy Keeler PA-C Entered By: Worthy Keeler on 01/24/2018 11:33:16

## 2018-01-25 NOTE — Progress Notes (Signed)
ROYAL, VANDEVOORT ([id number]) Visit Report for 01/24/2018 Arrival Information Details Patient Name: Gregory [name], Gregory [name] Date of Service: 01/24/2018 9:00 AM Medical Record Number: [id number] Patient Account Number: [id number] Date of Birth/Sex: 04/28/1950 (68 y.o. M) Treating RN: Montey Hora Primary Care Sharnay Cashion: [name] Other Clinician: Referring Bettey Muraoka: [name] Treating Rhonin Trott/Extender: Melburn Hake, HOYT Weeks in Treatment: 1 Visit Information History Since Last Visit Added or deleted any medications: No Patient Arrived: Ambulatory Any new allergies or adverse reactions: No Arrival Time: 09:02 Had a fall or experienced change in No Accompanied By: wife, daughter activities of daily living that may affect Transfer Assistance: None risk of falls: Patient Identification Verified: Yes Signs or symptoms of abuse/neglect since last visito No Secondary Verification Process Completed: Yes Hospitalized since last visit: No Implantable device outside of the clinic excluding No cellular tissue based products placed in the center since last visit: Has Dressing in Place as Prescribed: Yes Pain Present Now: Yes Electronic Signature(s) Signed: 01/24/2018 3:57:43 PM By: Lorine Bears RCP, RRT, CHT Entered By: Lorine Bears on 01/24/2018 09:03:51 Gregory [name] ([id number]) -------------------------------------------------------------------------------- Compression Therapy Details Patient Name: Gregory [name] Date of Service: 01/24/2018 9:00 AM Medical Record Number: [id number] Patient Account Number: [id number] Date of Birth/Sex: 1950-11-07 (68 y.o. M) Treating RN: Montey Hora Primary Care Zeanna Sunde: [name] Other Clinician: Referring Tevis Conger: [name] Treating Lamija Besse/Extender: Melburn Hake, HOYT Weeks in Treatment: 1 Compression Therapy Performed for Wound Assessment: Wound #1 Right,Circumferential Lower Leg Performed By:  Clinician Montey Hora, RN Compression Type: Three Layer Post Procedure Diagnosis Same as Pre-procedure Electronic Signature(s) Signed: 01/24/2018 3:43:47 PM By: Montey Hora Entered By: Montey Hora on 01/24/2018 10:07:19 Gregory [name] ([id number]) -------------------------------------------------------------------------------- Compression Therapy Details Patient Name: Gregory [name] Date of Service: 01/24/2018 9:00 AM Medical Record Number: [id number] Patient Account Number: [id number] Date of Birth/Sex: 05-09-1950 (68 y.o. M) Treating RN: Montey Hora Primary Care Brighten Buzzelli: [name] Other Clinician: Referring Laketa Sandoz: [name] Treating Erielle Gawronski/Extender: Melburn Hake, HOYT Weeks in Treatment: 1 Compression Therapy Performed for Wound Assessment: Wound #2 Left,Lateral Lower Leg Performed By: Clinician Montey Hora, RN Compression Type: Three Layer Post Procedure Diagnosis Same as Pre-procedure Electronic Signature(s) Signed: 01/24/2018 3:43:47 PM By: Montey Hora Entered By: Montey Hora on 01/24/2018 10:07:19 Gregory [name] ([id number]) -------------------------------------------------------------------------------- Encounter Discharge Information Details Patient Name: Gregory [name] Date of Service: 01/24/2018 9:00 AM Medical Record Number: [id number] Patient Account Number: [id number] Date of Birth/Sex: 09/21/1950 (68 y.o. M) Treating RN: Montey Hora Primary Care Olando Willems: [name] Other Clinician: Referring Calbert Hulsebus: [name] Treating Maigan Bittinger/Extender: Melburn Hake, HOYT Weeks in Treatment: 1 Encounter Discharge Information Items Discharge Condition: Stable Ambulatory Status: Ambulatory Discharge Destination: Home Transportation: Private Auto Accompanied By: spouse Schedule Follow-up Appointment: Yes Clinical Summary of Care: Electronic Signature(s) Signed: 01/24/2018 3:43:47 PM By: Montey Hora Entered By: Montey Hora  on 01/24/2018 10:46:23 Gregory [name] ([id number]) -------------------------------------------------------------------------------- Lower Extremity Assessment Details Patient Name: Gregory [name] Date of Service: 01/24/2018 9:00 AM Medical Record Number: [id number] Patient Account Number: [id number] Date of Birth/Sex: Mar 21, 1950 (68 y.o. M) Treating RN: Secundino Ginger Primary Care Myracle Febres: [name] Other Clinician: Referring Amareon Phung: [name] Treating Ruffus Kamaka/Extender: Melburn Hake, HOYT Weeks in Treatment: 1 Edema Assessment Assessed: [Left: No] [Right: No] [Left: Edema] [Right: :] Calf Left: Right: Point of Measurement: 32 cm From Medial Instep 40 cm 44 cm Ankle Left: Right: Point of Measurement: 12 cm From Medial Instep 28 cm 30 cm Vascular Assessment Claudication: Claudication Assessment [Left:None] [Right:None] Pulses: Dorsalis  Pedis Palpable: [Left:Yes] [Right:Yes] Posterior Tibial Extremity colors, hair growth, and conditions: Extremity Color: [Left:Normal] [Right:Red] Hair Growth on Extremity: [Left:No] [Right:No] Temperature of Extremity: [Left:Warm] [Right:Warm] Capillary Refill: [Left:< 3 seconds] [Right:< 3 seconds] Toe Nail Assessment Left: Right: Thick: Yes Yes Discolored: No No Deformed: No No Improper Length and Hygiene: No No Electronic Signature(s) Signed: 01/24/2018 3:23:26 PM By: Secundino Ginger Entered By: Secundino Ginger on 01/24/2018 09:32:53 Gregory [name] ([id number]) -------------------------------------------------------------------------------- Multi Wound Chart Details Patient Name: Gregory [name] Date of Service: 01/24/2018 9:00 AM Medical Record Number: [id number] Patient Account Number: [id number] Date of Birth/Sex: [birth year] (68 y.o. M) Treating RN: Montey Hora Primary Care Dimples Probus: [name] Other Clinician: Referring Darlin Stenseth: [name] Treating Adabella Stanis/Extender: Melburn Hake, HOYT Weeks in Treatment: 1 Vital  Signs Height(in): 68 Pulse(bpm): 61 Weight(lbs): 285 Blood Pressure(mmHg): 121/58 Body Mass Index(BMI): 43 Temperature(F): 97.6 Respiratory Rate 16 (breaths/min): Photos: [N/A:N/A] Wound Location: Right Lower Leg - Left Lower Leg - Lateral N/A Circumfernential Wounding Event: Gradually Appeared Gradually Appeared N/A Primary Etiology: Venous Leg Ulcer Venous Leg Ulcer N/A Comorbid History: Cataracts, Hypertension, Type Cataracts, Hypertension, Type N/A II Diabetes II Diabetes Date Acquired: 12/03/2017 12/30/2017 N/A Weeks of Treatment: 1 1 N/A Wound Status: Open Open N/A Measurements L x W x D 0.1x0.1x0.1 0.9x0.7x0.1 N/A (cm) Area (cm) : 0.008 0.495 N/A Volume (cm) : 0.001 0.049 N/A % Reduction in Area: 100.00% 35.70% N/A % Reduction in Volume: 100.00% 36.40% N/A Classification: Partial Thickness Full Thickness Without N/A Exposed Support Structures Exudate Amount: None Present Small N/A Exudate Type: N/A Serous N/A Exudate Color: N/A amber N/A Wound Margin: Indistinct, nonvisible Flat and Intact N/A Granulation Amount: None Present (0%) Large (67-100%) N/A Granulation Quality: N/A Red N/A Necrotic Amount: None Present (0%) None Present (0%) N/A Exposed Structures: Fascia: No Fat Layer (Subcutaneous N/A Fat Layer (Subcutaneous Tissue) Exposed: Yes Tissue) Exposed: No Fascia: No Tendon: No Tendon: No Muscle: No Muscle: No TEDRIC, LEETH ([id number]) Joint: No Joint: No Bone: No Bone: No Limited to Skin Breakdown Epithelialization: Medium (34-66%) Small (1-33%) N/A Periwound Skin Texture: Excoriation: Yes Excoriation: No N/A Induration: No Induration: No Callus: No Callus: No Crepitus: No Crepitus: No Rash: No Rash: No Scarring: No Scarring: No Periwound Skin Moisture: Maceration: Yes Maceration: No N/A Dry/Scaly: No Dry/Scaly: No Periwound Skin Color: Erythema: Yes Hemosiderin Staining: Yes N/A Hemosiderin Staining: Yes Atrophie  Blanche: No Atrophie Blanche: No Cyanosis: No Cyanosis: No Ecchymosis: No Ecchymosis: No Erythema: No Mottled: No Mottled: No Pallor: No Pallor: No Rubor: No Rubor: No Temperature: No Abnormality No Abnormality N/A Tenderness on Palpation: Yes Yes N/A Wound Preparation: Ulcer Cleansing: Other: soap Ulcer Cleansing: Other: soap N/A and water and water Topical Anesthetic Applied: Topical Anesthetic Applied: None None Treatment Notes Electronic Signature(s) Signed: 01/24/2018 3:43:47 PM By: Montey Hora Entered By: Montey Hora on 01/24/2018 09:52:24 Gregory [name] ([id number]) -------------------------------------------------------------------------------- Bryce Details Patient Name: Gregory [name] Date of Service: 01/24/2018 9:00 AM Medical Record Number: [id number] Patient Account Number: [id number] Date of Birth/Sex: 01/19/1950 (68 y.o. M) Treating RN: Montey Hora Primary Care Khamila Bassinger: [name] Other Clinician: Referring Matthew Cina: [name] Treating Kalayla Shadden/Extender: Melburn Hake, HOYT Weeks in Treatment: 1 Active Inactive Soft Tissue Infection Nursing Diagnoses: Impaired tissue integrity Potential for infection: soft tissue Goals: Patient's soft tissue infection will resolve Date Initiated: 01/22/2018 Target Resolution Date: 01/27/2018 Goal Status: Active Interventions: Assess signs and symptoms of infection every visit Treatment Activities: Systemic antibiotics : 01/22/2018 Notes: Wound/Skin Impairment Nursing Diagnoses: Impaired tissue  integrity Knowledge deficit related to ulceration/compromised skin integrity Goals: Ulcer/skin breakdown will have a volume reduction of 30% by week 4 Date Initiated: 01/17/2018 Target Resolution Date: 02/17/2018 Goal Status: Active Interventions: Assess patient/caregiver ability to obtain necessary supplies Assess patient/caregiver ability to perform ulcer/skin care regimen upon  admission and as needed Assess ulceration(s) every visit Notes: Electronic Signature(s) Signed: 01/24/2018 3:43:47 PM By: Montey Hora Entered By: Montey Hora on 01/24/2018 09:52:18 Gregory [name] ([id number]) Bernie Covey, Guy Sandifer ([id number]) -------------------------------------------------------------------------------- Pain Assessment Details Patient Name: Gregory [name] Date of Service: 01/24/2018 9:00 AM Medical Record Number: [id number] Patient Account Number: [id number] Date of Birth/Sex: 1950-06-20 (68 y.o. M) Treating RN: Montey Hora Primary Care Dvante Hands: [name] Other Clinician: Referring Kateryn Marasigan: [name] Treating Dameshia Seybold/Extender: Melburn Hake, HOYT Weeks in Treatment: 1 Active Problems Location of Pain Severity and Description of Pain Patient Has Paino Yes Site Locations Rate the pain. Current Pain Level: 4 Pain Management and Medication Current Pain Management: Electronic Signature(s) Signed: 01/24/2018 3:43:47 PM By: Montey Hora Signed: 01/24/2018 3:57:43 PM By: Becky Sax, Sallie RCP, RRT, CHT Entered By: Lorine Bears on 01/24/2018 09:04:01 Gregory [name] ([id number]) -------------------------------------------------------------------------------- Patient/Caregiver Education Details Patient Name: Gregory [name] Date of Service: 01/24/2018 9:00 AM Medical Record Number: [id number] Patient Account Number: [id number] Date of Birth/Gender: 1950/05/25 (68 y.o. M) Treating RN: Montey Hora Primary Care Physician: [name] Other Clinician: Referring Physician: [name] Treating Physician/Extender: Sharalyn Ink in Treatment: 1 Education Assessment Education Provided To: Patient and Caregiver Education Topics Provided Wound/Skin Impairment: Handouts: Other: wound care as ordered Methods: Explain/Verbal Responses: State content correctly Electronic Signature(s) Signed: 01/24/2018 3:43:47  PM By: Montey Hora Entered By: Montey Hora on 01/24/2018 10:07:55 Gregory [name] ([id number]) -------------------------------------------------------------------------------- Wound Assessment Details Patient Name: Gregory [name] Date of Service: 01/24/2018 9:00 AM Medical Record Number: [id number] Patient Account Number: [id number] Date of Birth/Sex: Aug 18, 1950 (68 y.o. M) Treating RN: Secundino Ginger Primary Care Vivyan Biggers: [name] Other Clinician: Referring Io Dieujuste: [name] Treating Ahnna Dungan/Extender: Melburn Hake, HOYT Weeks in Treatment: 1 Wound Status Wound Number: 1 Primary Etiology: Venous Leg Ulcer Wound Location: Right Lower Leg - Circumfernential Wound Status: Open Wounding Event: Gradually Appeared Comorbid History: Cataracts, Hypertension, Type II Diabetes Date Acquired: 12/03/2017 Weeks Of Treatment: 1 Clustered Wound: No Photos Photo Uploaded By: Secundino Ginger on 01/24/2018 09:35:18 Wound Measurements Length: (cm) 0.1 Width: (cm) 0.1 Depth: (cm) 0.1 Area: (cm) 0.008 Volume: (cm) 0.001 % Reduction in Area: 100% % Reduction in Volume: 100% Epithelialization: Medium (34-66%) Tunneling: No Undermining: No Wound Description Classification: Partial Thickness Foul Od Wound Margin: Indistinct, nonvisible Slough/ Exudate Amount: None Present or After Cleansing: No Fibrino No Wound Bed Granulation Amount: None Present (0%) Exposed Structure Necrotic Amount: None Present (0%) Fascia Exposed: No Fat Layer (Subcutaneous Tissue) Exposed: No Tendon Exposed: No Muscle Exposed: No Joint Exposed: No Bone Exposed: No Limited to Skin Breakdown Periwound Skin Texture Texture Color ROGER, KETTLES ([id number]) No Abnormalities Noted: No No Abnormalities Noted: No Callus: No Atrophie Blanche: No Crepitus: No Cyanosis: No Excoriation: Yes Ecchymosis: No Induration: No Erythema: Yes Rash: No Hemosiderin Staining: Yes Scarring: No Mottled:  No Pallor: No Moisture Rubor: No No Abnormalities Noted: No Dry / Scaly: No Temperature / Pain Maceration: Yes Temperature: No Abnormality Tenderness on Palpation: Yes Wound Preparation Ulcer Cleansing: Other: soap and water, Topical Anesthetic Applied: None Treatment Notes Wound #1 (Right, Circumferential Lower Leg) Notes silvercel left, ABD to right with 3 layer wraps bilateral Electronic Signature(s) Signed:  01/24/2018 3:23:26 PM By: Secundino Ginger Entered By: Secundino Ginger on 01/24/2018 09:22:29 Gregory [name] ([id number]) -------------------------------------------------------------------------------- Wound Assessment Details Patient Name: IAM, LIPSON Date of Service: 01/24/2018 9:00 AM Medical Record Number: [id number] Patient Account Number: [id number] Date of Birth/Sex: 1950-01-30 (68 y.o. M) Treating RN: Secundino Ginger Primary Care Anaclara Acklin: [name] Other Clinician: Referring Dally Oshel: [name] Treating Emmalena Canny/Extender: Melburn Hake, HOYT Weeks in Treatment: 1 Wound Status Wound Number: 2 Primary Etiology: Venous Leg Ulcer Wound Location: Left Lower Leg - Lateral Wound Status: Open Wounding Event: Gradually Appeared Comorbid History: Cataracts, Hypertension, Type II Diabetes Date Acquired: 12/30/2017 Weeks Of Treatment: 1 Clustered Wound: No Photos Photo Uploaded By: Secundino Ginger on 01/24/2018 09:35:53 Wound Measurements Length: (cm) 0.9 Width: (cm) 0.7 Depth: (cm) 0.1 Area: (cm) 0.495 Volume: (cm) 0.049 % Reduction in Area: 35.7% % Reduction in Volume: 36.4% Epithelialization: Small (1-33%) Tunneling: No Undermining: No Wound Description Full Thickness Without Exposed Support Foul Odo Classification: Structures Slough/F Wound Margin: Flat and Intact Exudate Small Amount: Exudate Type: Serous Exudate Color: amber r After Cleansing: No ibrino No Wound Bed Granulation Amount: Large (67-100%) Exposed Structure Granulation Quality:  Red Fascia Exposed: No Necrotic Amount: None Present (0%) Fat Layer (Subcutaneous Tissue) Exposed: Yes Tendon Exposed: No Muscle Exposed: No Joint Exposed: No Bone Exposed: No KARSIN, PESTA ([id number]) Periwound Skin Texture Texture Color No Abnormalities Noted: No No Abnormalities Noted: No Callus: No Atrophie Blanche: No Crepitus: No Cyanosis: No Excoriation: No Ecchymosis: No Induration: No Erythema: No Rash: No Hemosiderin Staining: Yes Scarring: No Mottled: No Pallor: No Moisture Rubor: No No Abnormalities Noted: No Dry / Scaly: No Temperature / Pain Maceration: No Temperature: No Abnormality Tenderness on Palpation: Yes Wound Preparation Ulcer Cleansing: Other: soap and water, Topical Anesthetic Applied: None Treatment Notes Wound #2 (Left, Lateral Lower Leg) Notes silvercel left, ABD to right with 3 layer wraps bilateral Electronic Signature(s) Signed: 01/24/2018 3:23:26 PM By: Secundino Ginger Entered By: Secundino Ginger on 01/24/2018 09:29:41 Gregory [name] ([id number]) -------------------------------------------------------------------------------- Vitals Details Patient Name: Gregory [name] Date of Service: 01/24/2018 9:00 AM Medical Record Number: [id number] Patient Account Number: [id number] Date of Birth/Sex: December 31, 1950 (68 y.o. M) Treating RN: Montey Hora Primary Care Marcelus Dubberly: [name] Other Clinician: Referring Kinsley Holderman: [name] Treating Leelynn Whetsel/Extender: Melburn Hake, HOYT Weeks in Treatment: 1 Vital Signs Time Taken: 09:04 Temperature (F): 97.6 Height (in): 68 Pulse (bpm): 61 Weight (lbs): 285 Respiratory Rate (breaths/min): 16 Body Mass Index (BMI): 43.3 Blood Pressure (mmHg): 121/58 Reference Range: 80 - 120 mg / dl Airway Electronic Signature(s) Signed: 01/24/2018 3:57:43 PM By: Lorine Bears RCP, RRT, CHT Entered By: Lorine Bears on 01/24/2018 09:07:39

## 2018-01-27 ENCOUNTER — Encounter: Payer: Medicare HMO | Admitting: Physician Assistant

## 2018-01-27 DIAGNOSIS — L97822 Non-pressure chronic ulcer of other part of left lower leg with fat layer exposed: Secondary | ICD-10-CM | POA: Diagnosis not present

## 2018-01-27 DIAGNOSIS — I890 Lymphedema, not elsewhere classified: Secondary | ICD-10-CM | POA: Diagnosis not present

## 2018-01-27 DIAGNOSIS — I83018 Varicose veins of right lower extremity with ulcer other part of lower leg: Secondary | ICD-10-CM | POA: Diagnosis not present

## 2018-01-27 DIAGNOSIS — E11622 Type 2 diabetes mellitus with other skin ulcer: Secondary | ICD-10-CM | POA: Diagnosis not present

## 2018-01-27 DIAGNOSIS — I10 Essential (primary) hypertension: Secondary | ICD-10-CM | POA: Diagnosis not present

## 2018-01-27 DIAGNOSIS — L03115 Cellulitis of right lower limb: Secondary | ICD-10-CM | POA: Diagnosis not present

## 2018-01-27 DIAGNOSIS — L97811 Non-pressure chronic ulcer of other part of right lower leg limited to breakdown of skin: Secondary | ICD-10-CM | POA: Diagnosis not present

## 2018-01-27 DIAGNOSIS — I83028 Varicose veins of left lower extremity with ulcer other part of lower leg: Secondary | ICD-10-CM | POA: Diagnosis not present

## 2018-01-27 DIAGNOSIS — L97812 Non-pressure chronic ulcer of other part of right lower leg with fat layer exposed: Secondary | ICD-10-CM | POA: Diagnosis not present

## 2018-01-29 ENCOUNTER — Encounter: Payer: Medicare HMO | Attending: Family Medicine | Admitting: *Deleted

## 2018-01-29 DIAGNOSIS — E1169 Type 2 diabetes mellitus with other specified complication: Secondary | ICD-10-CM | POA: Insufficient documentation

## 2018-01-29 DIAGNOSIS — E669 Obesity, unspecified: Secondary | ICD-10-CM | POA: Diagnosis not present

## 2018-01-29 NOTE — Patient Instructions (Signed)
Plan:  Aim for 4 Carb Choices per meal (60 grams) +/- 1 either way  Aim for 0-2 Carbs per snack if hungry  Include protein in moderation with your meals and snacks Consider reading food labels for Total Carbohydrate and Sodium Content of foods Consider  increasing your activity level daily as tolerated

## 2018-01-30 NOTE — Progress Notes (Signed)
Diabetes Self-Management Education  Visit Type: First/Initial  Appt. Start Time: 0800 Appt. End Time: 0930  01/30/2018  [name], identified by name and date of birth, is a 68 y.o. male with a diagnosis of Diabetes: Type 2.  He is here with his wife, who participated in the visit. 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 He is planning to have knee surgery as soon as he can lose the weight needed to have it done.  He does not have a meter yet and is not on any diabetes medications. His wife prepares the meals and she states she is very careful about the sodium content of the foods she fixes.   ASSESSMENT  There were no vitals taken for this visit. There is no height or weight on file to calculate BMI.  Diabetes Self-Management Education - 01/29/18 0824      Visit Information   Visit Type  First/Initial      Initial Visit   Diabetes Type  Type 2    Are you currently following a meal plan?  No    Are you taking your medications as prescribed?  Not on Medications      Health Coping   How would you rate your overall health?  Poor      Psychosocial Assessment   Patient Belief/Attitude about Diabetes  Other (comment)   confused   Self-care barriers  None    Other persons present  Patient;Spouse/SO    Patient Concerns  Nutrition/Meal planning    Special Needs  None    Preferred Learning Style  No preference indicated    Learning Readiness  Contemplating    What is the last grade level you completed in school?  3 years college      Pre-Education Assessment   Patient understands the diabetes disease and treatment process.  Needs Instruction    Patient understands incorporating nutritional management into lifestyle.  Needs Instruction    Patient undertands incorporating physical activity into lifestyle.  Needs Instruction    Patient understands using medications safely.  Needs Instruction    Patient  understands monitoring blood glucose, interpreting and using results  Needs Instruction    Patient understands prevention, detection, and treatment of acute complications.  Needs Instruction    Patient understands prevention, detection, and treatment of chronic complications.  Needs Instruction    Patient understands how to develop strategies to address psychosocial issues.  Needs Instruction    Patient understands how to develop strategies to promote health/change behavior.  Needs Instruction      Complications   Last HgB A1C per patient/outside source  5.8 %    How often do you check your blood sugar?  0 times/day (not testing)    Have you had a dilated eye exam in the past 12 months?  Yes    Have you had a dental exam in the past 12 months?  Yes    Are you checking your feet?  Yes    How many days per week are you checking your feet?  5      Dietary Intake   Breakfast  bacon egg biscuit NOW grits, 1 egg, occasionally sausage    Snack (morning)  occasionally nutrition bar OR payday candy bar    Lunch  buys on job [employer] classic, (no fries) - twice a week OR skips    Dinner  wife cooks most nights now-  meat, starch, vegetables, salad occasionally, has cut back to 1 serving now    Snack (evening)  ocasionally 1/3 cup ice cream OR gelato    Beverage(s)  coffee with flavored creamer, medium regular soda, was drinking at least 4 beers/night, less than 3/week now      Exercise   Exercise Type  ADL's      Patient Education   Previous Diabetes Education  No    Disease state   Definition of diabetes, type 1 and 2, and the diagnosis of diabetes;Factors that contribute to the development of diabetes    Nutrition management   Role of diet in the treatment of diabetes and the relationship between the three main macronutrients and blood glucose level;Food label reading, portion sizes and measuring food.;Carbohydrate counting;Reviewed blood glucose goals for pre and post meals and how to evaluate the  patients' food intake on their blood glucose level.    Physical activity and exercise   Role of exercise on diabetes management, blood pressure control and cardiac health.    Monitoring  Purpose and frequency of SMBG.;Identified appropriate SMBG and/or A1C goals.    Chronic complications  Relationship between chronic complications and blood glucose control    Psychosocial adjustment  Role of stress on diabetes      Individualized Goals (developed by patient)   Nutrition  General guidelines for healthy choices and portions discussed    Physical Activity  Exercise 3-5 times per week    Medications  Not Applicable      Post-Education Assessment   Patient understands the diabetes disease and treatment process.  Demonstrates understanding / competency    Patient understands incorporating nutritional management into lifestyle.  Demonstrates understanding / competency    Patient undertands incorporating physical activity into lifestyle.  Demonstrates understanding / competency    Patient understands prevention, detection, and treatment of acute complications.  Demonstrates understanding / competency    Patient understands prevention, detection, and treatment of chronic complications.  Demonstrates understanding / competency    Patient understands how to develop strategies to address psychosocial issues.  Demonstrates understanding / competency    Patient understands how to develop strategies to promote health/change behavior.  Demonstrates understanding / competency      Outcomes   Expected Outcomes  Demonstrated interest in learning. Expect positive outcomes    Future DMSE  PRN    Program Status  Completed       Individualized Plan for Diabetes Self-Management Training:   Learning Objective:  Patient will have a greater understanding of diabetes self-management. Patient education plan is to attend individual and/or group sessions per assessed needs and concerns.   Plan:   Patient  Instructions  Plan:  Aim for 4 Carb Choices per meal (60 grams) +/- 1 either way  Aim for 0-2 Carbs per snack if hungry  Include protein in moderation with your meals and snacks Consider reading food labels for Total Carbohydrate and Sodium Content of foods Consider  increasing your activity level daily as tolerated  Expected Outcomes:  Demonstrated interest in learning. Expect positive outcomes  Education material provided: Food label handouts, A1C conversion sheet and Carbohydrate counting sheet  If problems or questions, patient to contact team via:  Phone  Future DSME appointment: PRN

## 2018-01-31 ENCOUNTER — Other Ambulatory Visit (INDEPENDENT_AMBULATORY_CARE_PROVIDER_SITE_OTHER): Payer: Self-pay | Admitting: Physician Assistant

## 2018-01-31 ENCOUNTER — Ambulatory Visit (INDEPENDENT_AMBULATORY_CARE_PROVIDER_SITE_OTHER): Payer: Medicare HMO

## 2018-01-31 DIAGNOSIS — I890 Lymphedema, not elsewhere classified: Secondary | ICD-10-CM | POA: Diagnosis not present

## 2018-01-31 DIAGNOSIS — L03115 Cellulitis of right lower limb: Secondary | ICD-10-CM | POA: Diagnosis not present

## 2018-01-31 DIAGNOSIS — E11622 Type 2 diabetes mellitus with other skin ulcer: Secondary | ICD-10-CM | POA: Diagnosis not present

## 2018-01-31 DIAGNOSIS — L98499 Non-pressure chronic ulcer of skin of other sites with unspecified severity: Secondary | ICD-10-CM | POA: Diagnosis not present

## 2018-01-31 DIAGNOSIS — L97812 Non-pressure chronic ulcer of other part of right lower leg with fat layer exposed: Secondary | ICD-10-CM | POA: Diagnosis not present

## 2018-01-31 DIAGNOSIS — I83018 Varicose veins of right lower extremity with ulcer other part of lower leg: Secondary | ICD-10-CM | POA: Diagnosis not present

## 2018-01-31 DIAGNOSIS — L97822 Non-pressure chronic ulcer of other part of left lower leg with fat layer exposed: Secondary | ICD-10-CM | POA: Diagnosis not present

## 2018-01-31 DIAGNOSIS — I83028 Varicose veins of left lower extremity with ulcer other part of lower leg: Secondary | ICD-10-CM | POA: Diagnosis not present

## 2018-01-31 DIAGNOSIS — I10 Essential (primary) hypertension: Secondary | ICD-10-CM | POA: Diagnosis not present

## 2018-02-02 NOTE — Progress Notes (Signed)
KEEVON, HENNEY ([id number]) Visit Report for 01/31/2018 Arrival Information Details Patient Name: Gregory [name], Gregory [name] Date of Service: 01/31/2018 2:30 PM Medical Record Number: [id number] Patient Account Number: [id number] Date of Birth/Sex: Aug 25, 1950 (68 y.o. M) Treating RN: Montey Hora Primary Care Malone Admire: [name] Other Clinician: Referring Jesselle Laflamme: [name] Treating Dacia Capers/Extender: Melburn Hake, HOYT Weeks in Treatment: 2 Visit Information History Since Last Visit Added or deleted any medications: No Patient Arrived: Ambulatory Any new allergies or adverse reactions: No Arrival Time: 14:38 Had a fall or experienced change in No Accompanied By: self activities of daily living that may affect Transfer Assistance: None risk of falls: Patient Identification Verified: Yes Signs or symptoms of abuse/neglect since last visito No Secondary Verification Process Yes Hospitalized since last visit: No Completed: Implantable device outside of the clinic excluding No Patient Has Alerts: Yes cellular tissue based products placed in the center Patient Alerts: ABI 01/31/18 L 1.16 R since last visit: 1.14 Has Dressing in Place as Prescribed: Yes TBI L .59 R .58 Has Compression in Place as Prescribed: Yes Pain Present Now: No Electronic Signature(s) Signed: 01/31/2018 4:57:04 PM By: Montey Hora Entered By: Montey Hora on 01/31/2018 16:57:04 Gregory [name] ([id number]) -------------------------------------------------------------------------------- Compression Therapy Details Patient Name: Gregory [name] Date of Service: 01/31/2018 2:30 PM Medical Record Number: [id number] Patient Account Number: [id number] Date of Birth/Sex: 10/16/1950 (68 y.o. M) Treating RN: Montey Hora Primary Care Lynnell Fiumara: [name] Other Clinician: Referring Kejon Feild: [name] Treating Kelvin Sennett/Extender: Melburn Hake, HOYT Weeks in Treatment: 2 Compression Therapy Performed  for Wound Assessment: Wound #2 Left,Lateral Lower Leg Performed By: Clinician Montey Hora, RN Compression Type: Three Layer Electronic Signature(s) Signed: 01/31/2018 5:16:27 PM By: Montey Hora Entered By: Montey Hora on 01/31/2018 14:39:50 Gregory [name] ([id number]) -------------------------------------------------------------------------------- Compression Therapy Details Patient Name: Gregory [name] Date of Service: 01/31/2018 2:30 PM Medical Record Number: [id number] Patient Account Number: [id number] Date of Birth/Sex: 03-08-1950 (68 y.o. M) Treating RN: Montey Hora Primary Care Vilda Zollner: [name] Other Clinician: Referring Braden Cimo: [name] Treating Marites Nath/Extender: Melburn Hake, HOYT Weeks in Treatment: 2 Compression Therapy Performed for Wound Assessment: NonWound Condition Lymphedema - Right Leg Performed By: Clinician Montey Hora, RN Compression Type: Three Layer Electronic Signature(s) Signed: 01/31/2018 5:16:27 PM By: Montey Hora Entered By: Montey Hora on 01/31/2018 14:40:06 Gregory [name] ([id number]) -------------------------------------------------------------------------------- Encounter Discharge Information Details Patient Name: Gregory [name], Gregory [name] Date of Service: 01/31/2018 2:30 PM Medical Record Number: [id number] Patient Account Number: [id number] Date of Birth/Sex: [birth year] (68 y.o. M) Treating RN: Montey Hora Primary Care Kadiatou Oplinger: [name] Other Clinician: Referring Neera Teng: [name] Treating Roda Lauture/Extender: Sharalyn Ink in Treatment: 2 Encounter Discharge Information Items Discharge Condition: Stable Ambulatory Status: Ambulatory Discharge Destination: Home Transportation: Private Auto Accompanied By: self Schedule Follow-up Appointment: No Clinical Summary of Care: Electronic Signature(s) Signed: 01/31/2018 5:16:27 PM By: Montey Hora Entered By: Montey Hora on 01/31/2018  14:41:03 Gregory [name] ([id number]) -------------------------------------------------------------------------------- Patient/Caregiver Education Details Patient Name: Gregory [name] Date of Service: 01/31/2018 2:30 PM Medical Record Number: [id number] Patient Account Number: [id number] Date of Birth/Gender: May 13, 1950 (68 y.o. M) Treating RN: Montey Hora Primary Care Physician: [name] Other Clinician: Referring Physician: [name] Treating Physician/Extender: Sharalyn Ink in Treatment: 2 Education Assessment Education Provided To: Patient Education Topics Provided Venous: Handouts: Other: need for compression Methods: Explain/Verbal Responses: State content correctly Electronic Signature(s) Signed: 01/31/2018 5:16:27 PM By: Montey Hora Entered By: Montey Hora on 01/31/2018 14:40:53 Gregory [name] ([id number]) -------------------------------------------------------------------------------- Wound Assessment  Details Patient Name: Gregory [name], Gregory [name] Date of Service: 01/31/2018 2:30 PM Medical Record Number: [id number] Patient Account Number: [id number] Date of Birth/Sex: [birth year] (68 y.o. M) Treating RN: Montey Hora Primary Care Lyndall Windt: [name] Other Clinician: Referring Eann Cleland: [name] Treating Isaid Salvia/Extender: Melburn Hake, HOYT Weeks in Treatment: 2 Wound Status Wound Number: 2 Primary Etiology: Venous Leg Ulcer Wound Location: Left Lower Leg - Lateral Wound Status: Open Wounding Event: Gradually Appeared Comorbid History: Cataracts, Hypertension, Type II Diabetes Date Acquired: 12/30/2017 Weeks Of Treatment: 2 Clustered Wound: No Wound Measurements Length: (cm) 0.1 Width: (cm) 0.1 Depth: (cm) 0.1 Area: (cm) 0.008 Volume: (cm) 0.001 % Reduction in Area: 99% % Reduction in Volume: 98.7% Epithelialization: Small (1-33%) Tunneling: No Undermining: No Wound Description Full Thickness Without Exposed  Support Foul Odo Classification: Structures Slough/F Wound Margin: Flat and Intact Exudate None Present Amount: r After Cleansing: No ibrino No Wound Bed Granulation Amount: None Present (0%) Exposed Structure Necrotic Amount: Small (1-33%) Fascia Exposed: No Necrotic Quality: Eschar Fat Layer (Subcutaneous Tissue) Exposed: Yes Tendon Exposed: No Muscle Exposed: No Joint Exposed: No Bone Exposed: No Periwound Skin Texture Texture Color No Abnormalities Noted: No No Abnormalities Noted: No Callus: No Atrophie Blanche: No Crepitus: No Cyanosis: No Excoriation: No Ecchymosis: No Induration: No Erythema: No Rash: No Hemosiderin Staining: Yes Scarring: No Mottled: No Pallor: No Moisture Rubor: No No Abnormalities Noted: No Dry / Scaly: No Temperature / Pain Maceration: No Temperature: No Abnormality Tenderness on Palpation: Yes Gregory [name], Gregory [name] ([id number]) Wound Preparation Ulcer Cleansing: Other: soap and water, Topical Anesthetic Applied: None Treatment Notes Wound #2 (Left, Lateral Lower Leg) Notes Siver Alg left; Bilateral Wraps weekly Electronic Signature(s) Signed: 01/31/2018 5:16:27 PM By: Montey Hora Entered By: Montey Hora on 01/31/2018 14:39:34

## 2018-02-02 NOTE — Progress Notes (Signed)
Gregory [name], Gregory [name] ([id number]) Visit Report for 01/27/2018 Arrival Information Details Patient Name: Gregory [name], Gregory [name] Date of Service: 01/27/2018 9:30 AM Medical Record Number: [id number] Patient Account Number: [id number] Date of Birth/Sex: 1950/09/30 (67 y.o. M) Treating RN: Cornell Barman Primary Care Hakiem Malizia: [name] Other Clinician: Referring Kyeisha Janowicz: [name] Treating Delman Goshorn/Extender: Melburn Hake, HOYT Weeks in Treatment: 1 Visit Information History Since Last Visit Added or deleted any medications: No Patient Arrived: Kasandra Knudsen Any new allergies or adverse reactions: No Arrival Time: 09:26 Had a fall or experienced change in No Accompanied By: wife activities of daily living that may affect Transfer Assistance: None risk of falls: Patient Identification Verified: Yes Signs or symptoms of abuse/neglect since last visito No Secondary Verification Process Completed: Yes Hospitalized since last visit: No Implantable device outside of the clinic excluding No cellular tissue based products placed in the center since last visit: Has Dressing in Place as Prescribed: Yes Pain Present Now: Yes Electronic Signature(s) Signed: 01/27/2018 2:36:35 PM By: Lorine Bears RCP, RRT, CHT Entered By: Lorine Bears on 01/27/2018 09:27:19 Gregory [name] ([id number]) -------------------------------------------------------------------------------- Encounter Discharge Information Details Patient Name: Gregory [name], Gregory [name] Date of Service: 01/27/2018 9:30 AM Medical Record Number: [id number] Patient Account Number: [id number] Date of Birth/Sex: 02-24-1950 (67 y.o. M) Treating RN: Cornell Barman Primary Care Samarion Ehle: [name] Other Clinician: Referring Michie Molnar: [name] Treating Liara Holm/Extender: Melburn Hake, HOYT Weeks in Treatment: 1 Encounter Discharge Information Items Discharge Condition: Stable Ambulatory Status: Cane Discharge Destination:  Home Transportation: Private Auto Accompanied By: self Schedule Follow-up Appointment: Yes Clinical Summary of Care: Electronic Signature(s) Signed: 01/27/2018 4:45:46 PM By: Gretta Cool, BSN, RN, CWS, Kim RN, BSN Entered By: Gretta Cool, BSN, RN, CWS, Kim on 01/27/2018 10:27:21 Gregory [name] ([id number]) -------------------------------------------------------------------------------- Lower Extremity Assessment Details Patient Name: Gregory [name], Gregory [name] Date of Service: 01/27/2018 9:30 AM Medical Record Number: [id number] Patient Account Number: [id number] Date of Birth/Sex: Jun 15, 1950 (67 y.o. M) Treating RN: Secundino Ginger Primary Care Erminie Foulks: [name] Other Clinician: Referring Prestin Munch: [name] Treating Bessie Boyte/Extender: Melburn Hake, HOYT Weeks in Treatment: 1 Edema Assessment Assessed: [Left: No] [Right: No] [Left: Edema] [Right: :] Calf Left: Right: Point of Measurement: 32 cm From Medial Instep 40 cm 40 cm Ankle Left: Right: Point of Measurement: 12 cm From Medial Instep 28 cm 27 cm Vascular Assessment Claudication: Claudication Assessment [Left:None] [Right:None] Pulses: Dorsalis Pedis Palpable: [Left:Yes] [Right:Yes] Posterior Tibial Extremity colors, hair growth, and conditions: Extremity Color: [Left:Red] [Right:Normal] Hair Growth on Extremity: [Right:No] Temperature of Extremity: [Left:Warm] [Right:Warm] Capillary Refill: [Left:< 3 seconds] [Right:< 3 seconds] Toe Nail Assessment Left: Right: Thick: No No Discolored: No No Deformed: No No Improper Length and Hygiene: No No Electronic Signature(s) Signed: 01/27/2018 2:52:40 PM By: Secundino Ginger Signed: 01/27/2018 4:45:46 PM By: Gretta Cool, BSN, RN, CWS, Kim RN, BSN Entered By: Gretta Cool, BSN, RN, CWS, Kim on 01/27/2018 10:24:24 Gregory [name] ([id number]) -------------------------------------------------------------------------------- Multi Wound Chart Details Patient Name: Gregory [name], Gregory [name] Date of Service:  01/27/2018 9:30 AM Medical Record Number: [id number] Patient Account Number: [id number] Date of Birth/Sex: Jan 30, 1950 (67 y.o. M) Treating RN: Cornell Barman Primary Care Latoy Labriola: [name] Other Clinician: Referring Khaza Blansett: [name] Treating Dawnyel Leven/Extender: Melburn Hake, HOYT Weeks in Treatment: 1 Vital Signs Height(in): 68 Pulse(bpm): 71 Weight(lbs): 285 Blood Pressure(mmHg): 130/55 Body Mass Index(BMI): 43 Temperature(F): 97.6 Respiratory Rate 16 (breaths/min): Photos: [1:No Photos] [2:No Photos] [N/A:N/A] Wound Location: [1:Right Lower Leg - Circumferential] [2:Left Lower Leg - Lateral] [N/A:N/A] Wounding Event: [1:Gradually Appeared] [2:Gradually Appeared] [N/A:N/A] Primary Etiology: [1:Venous Leg  Ulcer] [2:Venous Leg Ulcer] [N/A:N/A] Comorbid History: [1:Cataracts, Hypertension, Type II Diabetes] [2:Cataracts, Hypertension, Type II Diabetes] [N/A:N/A] Date Acquired: [1:12/03/2017] [2:12/30/2017] [N/A:N/A] Weeks of Treatment: [1:1] [2:1] [N/A:N/A] Wound Status: [1:Open] [2:Open] [N/A:N/A] Measurements L x W x D [1:0.1x0.1x0.1] [2:0.9x0.5x0.1] [N/A:N/A] (cm) Area (cm) : [1:0.008] [2:0.353] [N/A:N/A] Volume (cm) : [1:0.001] [2:0.035] [N/A:N/A] % Reduction in Area: [1:100.00%] [2:54.20%] [N/A:N/A] % Reduction in Volume: [1:100.00%] [2:54.50%] [N/A:N/A] Classification: [1:Partial Thickness] [2:Full Thickness Without Exposed Support Structures] [N/A:N/A] Exudate Amount: [1:None Present] [2:Small] [N/A:N/A] Exudate Type: [1:N/A] [2:Serous] [N/A:N/A] Exudate Color: [1:N/A] [2:amber] [N/A:N/A] Wound Margin: [1:Indistinct, nonvisible] [2:Flat and Intact] [N/A:N/A] Granulation Amount: [1:None Present (0%)] [2:None Present (0%)] [N/A:N/A] Necrotic Amount: [1:None Present (0%)] [2:Small (1-33%)] [N/A:N/A] Necrotic Tissue: [1:N/A] [2:Eschar] [N/A:N/A] Exposed Structures: [1:Fascia: No Fat Layer (Subcutaneous Tissue) Exposed: No Tendon: No Muscle: No Joint: No Bone: No Limited  to Skin Breakdown] [2:Fat Layer (Subcutaneous Tissue) Exposed: Yes Fascia: No Tendon: No Muscle: No Joint: No Bone: No] [N/A:N/A] Epithelialization: [1:Medium (34-66%)] [2:Small (1-33%)] [N/A:N/A] Periwound Skin Texture: [1:Excoriation: Yes Induration: No Callus: No] [2:Excoriation: No Induration: No Callus: No] [N/A:N/A] Crepitus: No Crepitus: No Rash: No Rash: No Scarring: No Scarring: No Periwound Skin Moisture: Maceration: Yes Maceration: No N/A Dry/Scaly: No Dry/Scaly: No Periwound Skin Color: Erythema: Yes Hemosiderin Staining: Yes N/A Hemosiderin Staining: Yes Atrophie Blanche: No Atrophie Blanche: No Cyanosis: No Cyanosis: No Ecchymosis: No Ecchymosis: No Erythema: No Mottled: No Mottled: No Pallor: No Pallor: No Rubor: No Rubor: No Temperature: No Abnormality No Abnormality N/A Tenderness on Palpation: Yes Yes N/A Wound Preparation: Ulcer Cleansing: Other: soap Ulcer Cleansing: Other: soap N/A and water and water Topical Anesthetic Applied: Topical Anesthetic Applied: None None Treatment Notes Electronic Signature(s) Signed: 01/27/2018 4:45:46 PM By: Gretta Cool, BSN, RN, CWS, Kim RN, BSN Entered By: Gretta Cool, BSN, RN, CWS, Kim on 01/27/2018 10:18:58 Gregory [name] ([id number]) -------------------------------------------------------------------------------- Multi-Disciplinary Care Plan Details Patient Name: Gregory [name], Gregory [name] Date of Service: 01/27/2018 9:30 AM Medical Record Number: [id number] Patient Account Number: [id number] Date of Birth/Sex: 1950/10/20 (67 y.o. M) Treating RN: Cornell Barman Primary Care Dorlisa Savino: [name] Other Clinician: Referring Natisha Trzcinski: [name] Treating Toria Monte/Extender: Melburn Hake, HOYT Weeks in Treatment: 1 Active Inactive Soft Tissue Infection Nursing Diagnoses: Impaired tissue integrity Potential for infection: soft tissue Goals: Patient's soft tissue infection will resolve Date Initiated: 01/22/2018 Target  Resolution Date: 01/27/2018 Goal Status: Active Interventions: Assess signs and symptoms of infection every visit Treatment Activities: Systemic antibiotics : 01/22/2018 Notes: Wound/Skin Impairment Nursing Diagnoses: Impaired tissue integrity Knowledge deficit related to ulceration/compromised skin integrity Goals: Ulcer/skin breakdown will have a volume reduction of 30% by week 4 Date Initiated: 01/17/2018 Target Resolution Date: 02/17/2018 Goal Status: Active Interventions: Assess patient/caregiver ability to obtain necessary supplies Assess patient/caregiver ability to perform ulcer/skin care regimen upon admission and as needed Assess ulceration(s) every visit Notes: Electronic Signature(s) Signed: 01/27/2018 4:45:46 PM By: Gretta Cool, BSN, RN, CWS, Kim RN, BSN Entered By: Gretta Cool, BSN, RN, CWS, Kim on 01/27/2018 10:18:50 Gregory [name], Gregory [name] ([id number], BIVINS ([id number]) -------------------------------------------------------------------------------- Non-Wound Condition Assessment Details Patient Name: Gregory [name], Gregory [name] Date of Service: 01/27/2018 9:30 AM Medical Record Number: [id number] Patient Account Number: [id number] Date of Birth/Sex: 01-Sep-1950 (67 y.o. M) Treating RN: Cornell Barman Primary Care Burech Mcfarland: [name] Other Clinician: Referring Ecko Beasley: [name] Treating Johnross Nabozny/Extender: Melburn Hake, HOYT Weeks in Treatment: 1 Non-Wound Condition: Condition: Lymphedema Location: Leg Side: Right Periwound Skin Texture Texture Color No Abnormalities Noted: No No Abnormalities Noted: No Callus: No Atrophie Blanche: No Crepitus: No Cyanosis:  No Excoriation: No Ecchymosis: No Friable: No Erythema: No Induration: No Hemosiderin Staining: No Rash: No Mottled: No Scarring: No Pallor: No Rubor: Yes Moisture No Abnormalities Noted: No Dry / Scaly: Yes Maceration: No Electronic Signature(s) Signed: 01/27/2018 4:45:46 PM By: Gretta Cool, BSN, RN, CWS, Kim  RN, BSN Entered By: Gretta Cool, BSN, RN, CWS, Kim on 01/27/2018 10:20:42 Gregory [name] ([id number]) -------------------------------------------------------------------------------- Pain Assessment Details Patient Name: Gregory [name], Gregory [name] Date of Service: 01/27/2018 9:30 AM Medical Record Number: [id number] Patient Account Number: [id number] Date of Birth/Sex: 10/23/1950 (67 y.o. M) Treating RN: Cornell Barman Primary Care Bufford Helms: [name] Other Clinician: Referring Danelle Curiale: [name] Treating Veronique Warga/Extender: Melburn Hake, HOYT Weeks in Treatment: 1 Active Problems Location of Pain Severity and Description of Pain Patient Has Paino Yes Site Locations Rate the pain. Current Pain Level: 6 Pain Management and Medication Current Pain Management: Electronic Signature(s) Signed: 01/27/2018 2:36:35 PM By: Lorine Bears RCP, RRT, CHT Signed: 01/27/2018 4:45:46 PM By: Gretta Cool, BSN, RN, CWS, Kim RN, BSN Entered By: Lorine Bears on 01/27/2018 09:27:39 Gregory [name] ([id number]) -------------------------------------------------------------------------------- Patient/Caregiver Education Details Patient Name: Gregory [name] Date of Service: 01/27/2018 9:30 AM Medical Record Number: [id number] Patient Account Number: [id number] Date of Birth/Gender: 1950-03-24 (68 y.o. M) Treating RN: Cornell Barman Primary Care Physician: [name] Other Clinician: Referring Physician: [name] Treating Physician/Extender: Sharalyn Ink in Treatment: 1 Education Assessment Education Provided To: Patient Education Topics Provided Venous: Handouts: Controlling Swelling with Multilayered Compression Wraps Methods: Demonstration, Explain/Verbal Responses: State content correctly Wound/Skin Impairment: Handouts: Caring for Your Ulcer Methods: Demonstration, Explain/Verbal Responses: State content correctly Electronic Signature(s) Signed: 01/27/2018  4:45:46 PM By: Gretta Cool, BSN, RN, CWS, Kim RN, BSN Entered By: Gretta Cool, BSN, RN, CWS, Kim on 01/27/2018 10:27:37 Gregory [name] ([id number]) -------------------------------------------------------------------------------- Wound Assessment Details Patient Name: Gregory [name], Gregory [name] Date of Service: 01/27/2018 9:30 AM Medical Record Number: [id number] Patient Account Number: [id number] Date of Birth/Sex: 11/23/1950 (67 y.o. M) Treating RN: Cornell Barman Primary Care Kenedy Haisley: [name] Other Clinician: Referring Antwine Agosto: [name] Treating Eythan Jayne/Extender: Melburn Hake, HOYT Weeks in Treatment: 1 Wound Status Wound Number: 1 Primary Etiology: Venous Leg Ulcer Wound Location: Right, Circumferential Lower Leg Wound Status: Healed - Epithelialized Wounding Event: Gradually Appeared Comorbid History: Cataracts, Hypertension, Type II Diabetes Date Acquired: 12/03/2017 Weeks Of Treatment: 1 Clustered Wound: No Photos Photo Uploaded By: Secundino Ginger on 01/27/2018 10:53:31 Wound Measurements Length: (cm) 0 % Red Width: (cm) 0 % Red Depth: (cm) 0 Epith Area: (cm) 0 Tunn Volume: (cm) 0 Unde uction in Area: 100% uction in Volume: 100% elialization: Medium (34-66%) eling: No rmining: No Wound Description Classification: Partial Thickness Wound Margin: Indistinct, nonvisible Exudate Amount: None Present Foul Odor After Cleansing: No Slough/Fibrino No Wound Bed Granulation Amount: None Present (0%) Exposed Structure Necrotic Amount: None Present (0%) Fascia Exposed: No Fat Layer (Subcutaneous Tissue) Exposed: No Tendon Exposed: No Muscle Exposed: No Joint Exposed: No Bone Exposed: No Limited to Skin Breakdown Periwound Skin Texture Texture Color EARLY, STEEL ([id number]) No Abnormalities Noted: No No Abnormalities Noted: No Callus: No Atrophie Blanche: No Crepitus: No Cyanosis: No Excoriation: Yes Ecchymosis: No Induration: No Erythema: Yes Rash: No Hemosiderin  Staining: Yes Scarring: No Mottled: No Pallor: No Moisture Rubor: No No Abnormalities Noted: No Dry / Scaly: No Temperature / Pain Maceration: Yes Temperature: No Abnormality Tenderness on Palpation: Yes Wound Preparation Ulcer Cleansing: Other: soap and water, Topical Anesthetic Applied: None Electronic Signature(s) Signed: 01/27/2018 4:45:46 PM By: Gretta Cool, BSN, RN,  CWS, Kim RN, BSN Entered By: Gretta Cool, BSN, RN, CWS, Kim on 01/27/2018 10:21:09 Gregory [name] ([id number]) -------------------------------------------------------------------------------- Wound Assessment Details Patient Name: Gregory [name], Gregory [name] Date of Service: 01/27/2018 9:30 AM Medical Record Number: [id number] Patient Account Number: [id number] Date of Birth/Sex: [birth year] (67 y.o. M) Treating RN: Secundino Ginger Primary Care Tali Coster: [name] Other Clinician: Referring Tonique Mendonca: [name] Treating Rhonda Vangieson/Extender: Melburn Hake, HOYT Weeks in Treatment: 1 Wound Status Wound Number: 2 Primary Etiology: Venous Leg Ulcer Wound Location: Left Lower Leg - Lateral Wound Status: Open Wounding Event: Gradually Appeared Comorbid History: Cataracts, Hypertension, Type II Diabetes Date Acquired: 12/30/2017 Weeks Of Treatment: 1 Clustered Wound: No Photos Photo Uploaded By: Secundino Ginger on 01/27/2018 10:53:31 Wound Measurements Length: (cm) 0.9 Width: (cm) 0.5 Depth: (cm) 0.1 Area: (cm) 0.353 Volume: (cm) 0.035 % Reduction in Area: 54.2% % Reduction in Volume: 54.5% Epithelialization: Small (1-33%) Tunneling: No Undermining: No Wound Description Full Thickness Without Exposed Support Foul Odo Classification: Structures Slough/F Wound Margin: Flat and Intact Exudate Small Amount: Exudate Type: Serous Exudate Color: amber r After Cleansing: No ibrino No Wound Bed Granulation Amount: None Present (0%) Exposed Structure Necrotic Amount: Small (1-33%) Fascia Exposed: No Necrotic Quality:  Eschar Fat Layer (Subcutaneous Tissue) Exposed: Yes Tendon Exposed: No Muscle Exposed: No Joint Exposed: No Bone Exposed: No BHARATH, BERNSTEIN ([id number]) Periwound Skin Texture Texture Color No Abnormalities Noted: No No Abnormalities Noted: No Callus: No Atrophie Blanche: No Crepitus: No Cyanosis: No Excoriation: No Ecchymosis: No Induration: No Erythema: No Rash: No Hemosiderin Staining: Yes Scarring: No Mottled: No Pallor: No Moisture Rubor: No No Abnormalities Noted: No Dry / Scaly: No Temperature / Pain Maceration: No Temperature: No Abnormality Tenderness on Palpation: Yes Wound Preparation Ulcer Cleansing: Other: soap and water, Topical Anesthetic Applied: None Electronic Signature(s) Signed: 01/27/2018 2:52:40 PM By: Secundino Ginger Entered By: Secundino Ginger on 01/27/2018 09:54:41 Gregory [name] ([id number]) -------------------------------------------------------------------------------- [place] Details Patient Name: Gregory [name] Date of Service: 01/27/2018 9:30 AM Medical Record Number: [id number] Patient Account Number: [id number] Date of Birth/Sex: 25-Jun-1950 (67 y.o. M) Treating RN: Cornell Barman Primary Care Rain Friedt: [name] Other Clinician: Referring Jovanne Riggenbach: [name] Treating Nhyla Nappi/Extender: Melburn Hake, HOYT Weeks in Treatment: 1 Vital Signs Time Taken: 09:27 Temperature (F): 97.6 Height (in): 68 Pulse (bpm): 71 Weight (lbs): 285 Respiratory Rate (breaths/min): 16 Body Mass Index (BMI): 43.3 Blood Pressure (mmHg): 130/55 Reference Range: 80 - 120 mg / dl Airway Electronic Signature(s) Signed: 01/27/2018 2:36:35 PM By: Lorine Bears RCP, RRT, CHT Entered By: Lorine Bears on 01/27/2018 09:30:48

## 2018-02-03 ENCOUNTER — Other Ambulatory Visit: Payer: Self-pay | Admitting: Family Medicine

## 2018-02-04 ENCOUNTER — Encounter: Payer: Medicare HMO | Attending: Physician Assistant | Admitting: Physician Assistant

## 2018-02-04 DIAGNOSIS — Z09 Encounter for follow-up examination after completed treatment for conditions other than malignant neoplasm: Secondary | ICD-10-CM | POA: Insufficient documentation

## 2018-02-04 DIAGNOSIS — R609 Edema, unspecified: Secondary | ICD-10-CM | POA: Diagnosis not present

## 2018-02-04 DIAGNOSIS — I890 Lymphedema, not elsewhere classified: Secondary | ICD-10-CM | POA: Diagnosis not present

## 2018-02-04 DIAGNOSIS — Z872 Personal history of diseases of the skin and subcutaneous tissue: Secondary | ICD-10-CM | POA: Diagnosis not present

## 2018-02-04 DIAGNOSIS — I872 Venous insufficiency (chronic) (peripheral): Secondary | ICD-10-CM | POA: Diagnosis not present

## 2018-02-04 DIAGNOSIS — L97822 Non-pressure chronic ulcer of other part of left lower leg with fat layer exposed: Secondary | ICD-10-CM | POA: Diagnosis not present

## 2018-02-04 DIAGNOSIS — E119 Type 2 diabetes mellitus without complications: Secondary | ICD-10-CM | POA: Insufficient documentation

## 2018-02-04 DIAGNOSIS — I10 Essential (primary) hypertension: Secondary | ICD-10-CM | POA: Diagnosis not present

## 2018-02-05 NOTE — Progress Notes (Signed)
Gregory [name] ([id number]) Visit Report for 02/04/2018 Chief Complaint Document Details Patient Name: Gregory [name], Gregory [name] Date of Service: 02/04/2018 8:30 AM Medical Record Number: [id number] Patient Account Number: [id number] Date of Birth/Sex: December 15, 1950 (68 y.o. M) Treating RN: Montey Hora Primary Care Provider: [name] Other Clinician: Referring Provider: [name] Treating Provider/Extender: Melburn Hake, HOYT Weeks in Treatment: 2 Information Obtained from: Patient Chief Complaint Bilateral LE ulcers Electronic Signature(s) Signed: 02/04/2018 5:55:30 PM By: Worthy Keeler PA-C Entered By: Worthy Keeler on 02/04/2018 08:10:03 Gregory [name] ([id number]) -------------------------------------------------------------------------------- HPI Details Patient Name: Gregory [name] Date of Service: 02/04/2018 8:30 AM Medical Record Number: [id number] Patient Account Number: [id number] Date of Birth/Sex: 1950-01-14 (68 y.o. M) Treating RN: Montey Hora Primary Care Provider: [name] Other Clinician: Referring Provider: [name] Treating Provider/Extender: Melburn Hake, HOYT Weeks in Treatment: 2 History of Present Illness HPI Description: 01/17/18 on evaluation today patient presents for initial inspection concerning wounds that he has of the bilateral lower extremities. This has been an intermittent issue for him and he states that he's had swelling for at least a couple of years that he can remember. 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 He does have a history of hypertension as well as diabetes mellitus type II. His hemoglobin A1c was 8.5 on 01/14/18. Currently he has been putting antibiotic ointment on it and in regard to the right lower extremity using a telfa pad which you can see the outline of where there is  some maceration and skin breakdown. The weeping of the right lower extremity is more circumferential in nature. He is not currently on any oral antibiotic therapy. No fevers, chills, nausea, or vomiting noted at this time. 1/22; the patient was here for a nurse visit today but I was asked to look at the patient because of concerns raised by his wife and daughter who are present. The patient is on Levaquin based on concern of cellulitis in the right lower leg. This was apparently started on Monday. His daughter and wife are concerned about the increasing erythema predominantly on the anterior foot and anterior leg. There does not appear to be a lot of pain here but there is weeping lymphedema fluid coming out of the anterior leg with loss of epithelialization. The entire area looks somewhat angry but there is no palpable tenderness. There is extensive damage on the posterior calf as well. On the left leg he has a small wound that looks improved. The patient has not been systemically unwell no fever no chills. He worked a full day Designer, fashion/clothing I believe]. His daughter is actually a nurse with previous MedSurg training and should be able to change his dressings 01/24/18 on evaluation today patient appears to be doing better in my pinion in regard to his bilateral lower extremity ones/cellulitis. With that being said it sounds like when he came in first nurse visit on Wednesday things were not doing nearly as well as what they are today. In fact his wife states that it is much improved today compared to the way things appeared at that point. Nonetheless they were very worried in fact she tells me that she was "scared". His daughter who is a nurse has actually changed his dressing yesterday for him including the three layer compression wrap. She appeared to do a good job  with this which is excellent news. With that being said we did order wrap supplies for them to have it home just so  they can keep a close eye on this over the next several days as well. Fortunately there is no sign of systemic infection at this time which is good news. The erythema never spread out of the marked locations that we marked last Wednesday. 01/27/18 on evaluation today patient actually appears to be doing excellent in regard to his bilateral lower extremities. Overall I feel like his left lateral wound is almost completely healed and in regard to his right lower extremity he appears to be doing excellent in regard to his swelling. In general I am very pleased. The erythema and cellulitis seems to be completely resolving in regard to the right lower extremity. 02/04/18 on evaluation today patient appears to be doing rather well in regard to his bilateral lower extremities. There does not appear to be anything remaining open today which is great news. Overall very pleased with how things have progressed. There is no signs of infection he has completed both of his antibiotics. Electronic Signature(s) Signed: 02/04/2018 5:55:30 PM By: Worthy Keeler PA-C Entered By: Worthy Keeler on 02/04/2018 09:04:25 Gregory [name] ([id number]) -------------------------------------------------------------------------------- Physical Exam Details Patient Name: Gregory [name] Date of Service: 02/04/2018 8:30 AM Medical Record Number: [id number] Patient Account Number: [id number] Date of Birth/Sex: 10/16/1950 (68 y.o. M) Treating RN: Montey Hora Primary Care Provider: [name] Other Clinician: Referring Provider: [name] Treating Provider/Extender: Melburn Hake, HOYT Weeks in Treatment: 2 Constitutional Well-nourished and well-hydrated in no acute distress. Respiratory normal breathing without difficulty. Cardiovascular 1+ pitting edema of the bilateral lower extremities. Psychiatric this patient is able to make decisions and demonstrates good insight into disease process. Alert and Oriented x 3.  pleasant and cooperative. Notes Patient's wounds currently show signs of complete epithelialization which is excellent news. There is no sign of anything worsening and no sign of active infection which is good news. Overall very pleased with the progress that he has made. Electronic Signature(s) Signed: 02/04/2018 5:55:30 PM By: Worthy Keeler PA-C Entered By: Worthy Keeler on 02/04/2018 09:04:46 Gregory [name] ([id number]) -------------------------------------------------------------------------------- Physician Orders Details Patient Name: Gregory [name] Date of Service: 02/04/2018 8:30 AM Medical Record Number: [id number] Patient Account Number: [id number] Date of Birth/Sex: [birth year] (68 y.o. M) Treating RN: Montey Hora Primary Care Provider: [name] Other Clinician: Referring Provider: [name] Treating Provider/Extender: Melburn Hake, HOYT Weeks in Treatment: 2 Verbal / Phone Orders: No Diagnosis Coding ICD-10 Coding Code Description E11.622 Type 2 diabetes mellitus with other skin ulcer I87.2 Venous insufficiency (chronic) (peripheral) L97.812 Non-pressure chronic ulcer of other part of right lower leg with fat layer exposed L97.822 Non-pressure chronic ulcer of other part of left lower leg with fat layer exposed I10 Essential (primary) hypertension L03.115 Cellulitis of right lower limb Discharge From [facility] o Discharge from [place] Signature(s) Signed: 02/04/2018 4:52:45 PM By: Montey Hora Signed: 02/04/2018 5:55:30 PM By: Worthy Keeler PA-C Entered By: Montey Hora on 02/04/2018 08:58:36 Gregory [name] ([id number]) -------------------------------------------------------------------------------- Problem List Details Patient Name: Gregory [name] Date of Service: 02/04/2018 8:30 AM Medical Record Number: [id number] Patient Account Number: [id number] Date of Birth/Sex: [birth year] (68 y.o. M) Treating RN: Montey Hora Primary Care Provider: [name] Other Clinician: Referring Provider: [name] Treating Provider/Extender: Melburn Hake, HOYT Weeks in Treatment: 2 Active Problems ICD-10 Evaluated Encounter Code Description Active Date  Today Diagnosis E11.622 Type 2 diabetes mellitus with other skin ulcer 01/17/2018 No Yes I87.2 Venous insufficiency (chronic) (peripheral) 01/17/2018 No Yes L97.812 Non-pressure chronic ulcer of other part of right lower leg 01/17/2018 No Yes with fat layer exposed L97.822 Non-pressure chronic ulcer of other part of left lower leg with 01/17/2018 No Yes fat layer exposed I10 Essential (primary) hypertension 01/17/2018 No Yes L03.115 Cellulitis of right lower limb 01/22/2018 No Yes Inactive Problems Resolved Problems Electronic Signature(s) Signed: 02/04/2018 5:55:30 PM By: Worthy Keeler PA-C Entered By: Worthy Keeler on 02/04/2018 08:09:59 Gregory [name] ([id number]) -------------------------------------------------------------------------------- Progress Note Details Patient Name: Gregory [name] Date of Service: 02/04/2018 8:30 AM Medical Record Number: [id number] Patient Account Number: [id number] Date of Birth/Sex: 06-17-1950 (68 y.o. M) Treating RN: Montey Hora Primary Care Provider: [name] Other Clinician: Referring Provider: [name] Treating Provider/Extender: Melburn Hake, HOYT Weeks in Treatment: 2 Subjective Chief Complaint Information obtained from Patient Bilateral LE ulcers History of Present Illness (HPI) 01/17/18 on evaluation today patient presents for initial inspection concerning wounds that he has of the bilateral lower extremities. This has been an intermittent issue for him and he states that he's had swelling for at least a couple of years that he can remember. 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 He does have a history of hypertension as well as diabetes mellitus type II. His hemoglobin A1c was 8.5 on 01/14/18. Currently he has been putting antibiotic ointment on it and in regard to the right lower extremity using a telfa pad which you can see the outline of where there is some maceration and skin breakdown. The weeping of the right lower extremity is more circumferential in nature. He is not currently on any oral antibiotic therapy. No fevers, chills, nausea, or vomiting noted at this time. 1/22; the patient was here for a nurse visit today but I was asked to look at the patient because of concerns raised by his wife and daughter who are present. The patient is on Levaquin based on concern of cellulitis in the right lower leg. This was apparently started on Monday. His daughter and wife are concerned about the increasing erythema predominantly on the anterior foot and anterior leg. There does not appear to be a lot of pain here but there is weeping lymphedema fluid coming out of the anterior leg with loss of epithelialization. The entire area looks somewhat angry but there is no palpable tenderness. There is extensive damage on the posterior calf as well. On the left leg he has a small wound that looks improved. The patient has not been systemically unwell no fever no chills. He worked a full day Designer, fashion/clothing I believe]. His daughter is actually a nurse with previous MedSurg training and should be able to change his dressings 01/24/18 on evaluation today patient appears to be doing better in my pinion in regard to his bilateral lower extremity ones/cellulitis. With that being said it sounds like when he came in first nurse visit on Wednesday things were not doing nearly as well as what they are today. In fact his wife states that it is much improved today compared to the way things appeared at that point. Nonetheless they were very  worried in fact she tells me that she was "scared". His daughter who is a Marine scientist has actually changed  his dressing yesterday for him including the three layer compression wrap. She appeared to do a good job with this which is excellent news. With that being said we did order wrap supplies for them to have it home just so they can keep a close eye on this over the next several days as well. Fortunately there is no sign of systemic infection at this time which is good news. The erythema never spread out of the marked locations that we marked last Wednesday. 01/27/18 on evaluation today patient actually appears to be doing excellent in regard to his bilateral lower extremities. Overall I feel like his left lateral wound is almost completely healed and in regard to his right lower extremity he appears to be doing excellent in regard to his swelling. In general I am very pleased. The erythema and cellulitis seems to be completely resolving in regard to the right lower extremity. 02/04/18 on evaluation today patient appears to be doing rather well in regard to his bilateral lower extremities. There does not appear to be anything remaining open today which is great news. Overall very pleased with how things have progressed. There is no signs of infection he has completed both of his antibiotics. Patient History Information obtained from Patient. Family History CONNOR, MEACHAM ([id number]) [place], No family history of Diabetes, Heart Disease, Hereditary Spherocytosis, Hypertension, Kidney Disease, Lung Disease, Seizures, Stroke, Thyroid Problems, Tuberculosis. Social History Never smoker, Marital Status - Married, Alcohol Use - Moderate, Drug Use - No History, Caffeine Use - Daily. Medical History Eyes Patient has history of Cataracts - left; right removed Denies history of Glaucoma, Optic Neuritis Ear/Nose/Mouth/Throat Denies history of Chronic sinus problems/congestion, Middle  ear problems Hematologic/Lymphatic Denies history of Anemia, Hemophilia, Human Immunodeficiency Virus, Lymphedema, Sickle Cell Disease Respiratory Denies history of Aspiration, Asthma, Chronic Obstructive Pulmonary Disease (COPD), Pneumothorax, Sleep Apnea, Tuberculosis Cardiovascular Patient has history of Hypertension Denies history of Angina, Arrhythmia, Congestive Heart Failure, Coronary Artery Disease, Deep Vein Thrombosis, Hypotension, Myocardial Infarction, Peripheral Arterial Disease, Peripheral Venous Disease, Phlebitis, Vasculitis Gastrointestinal Denies history of Cirrhosis , Colitis, Crohn s, Hepatitis A, Hepatitis B, Hepatitis C Endocrine Patient has history of Type II Diabetes - borderline Denies history of Type I Diabetes Genitourinary Denies history of End Stage Renal Disease Immunological Denies history of Lupus Erythematosus, Raynaud s, Scleroderma Integumentary (Skin) Denies history of History of Burn, History of pressure wounds Musculoskeletal Denies history of Gout, Rheumatoid Arthritis, Osteoarthritis, Osteomyelitis Neurologic Denies history of Dementia, Neuropathy, Quadriplegia, Paraplegia, Seizure Disorder Oncologic Denies history of Received Chemotherapy, Received Radiation Psychiatric Denies history of Anorexia/bulimia, Confinement Anxiety Medical And Surgical History Notes Cardiovascular aortic insufficiency Review of Systems (ROS) Constitutional Symptoms (General Health) Denies complaints or symptoms of Fever, Chills. Respiratory The patient has no complaints or symptoms. Cardiovascular Complains or has symptoms of LE edema. Psychiatric The patient has no complaints or symptoms. JONNIE, TRUXILLO ([id number]) Objective Constitutional Well-nourished and well-hydrated in no acute distress. Vitals Time Taken: 8:31 AM, Height: 68 in, Weight: 285 lbs, BMI: 43.3, Temperature: 97.6 F, Pulse: 54 bpm, Respiratory Rate: 16 breaths/min, Blood  Pressure: 132/51 mmHg. Respiratory normal breathing without difficulty. Cardiovascular 1+ pitting edema of the bilateral lower extremities. Psychiatric this patient is able to make decisions and demonstrates good insight into disease process. Alert and Oriented x 3. pleasant and cooperative. General Notes: Patient's wounds currently show signs of complete epithelialization which is excellent news. There is no sign of anything worsening and no sign of active infection which is  good news. Overall very pleased with the progress that he has made. Integumentary (Hair, Skin) Wound #2 status is Healed - Epithelialized. Original cause of wound was Gradually Appeared. The wound is located on the Left,Lateral Lower Leg. The wound measures 0cm length x 0cm width x 0cm depth; 0cm^2 area and 0cm^3 volume. There is Fat Layer (Subcutaneous Tissue) Exposed exposed. There is no tunneling or undermining noted. There is a none present amount of drainage noted. The wound margin is flat and intact. There is no granulation within the wound bed. There is a small (1-33%) amount of necrotic tissue within the wound bed including Eschar. The periwound skin appearance exhibited: Hemosiderin Staining. The periwound skin appearance did not exhibit: Callus, Crepitus, Excoriation, Induration, Rash, Scarring, Dry/Scaly, Maceration, Atrophie Blanche, Cyanosis, Ecchymosis, Mottled, Pallor, Rubor, Erythema. Periwound temperature was noted as No Abnormality. The periwound has tenderness on palpation. Assessment Active Problems ICD-10 Type 2 diabetes mellitus with other skin ulcer Venous insufficiency (chronic) (peripheral) Non-pressure chronic ulcer of other part of right lower leg with fat layer exposed Non-pressure chronic ulcer of other part of left lower leg with fat layer exposed Essential (primary) hypertension Cellulitis of right lower limb Plan JARRAH, BABICH ([id number]) Discharge From [facility]  Services: Discharge from [place] My suggestion currently is gonna be that we continue with the above wound care measures for the next week. I did review his arterial study with him today which revealed excellent findings in regard to his ABI's and TBI's. I think he has no issues in this regard. Therefore he will continue to wear the compression stockings which he did pick up from [place] as well. We will subsequently see were things stand in the future as needed if anything changes or worsens. Electronic Signature(s) Signed: 02/04/2018 5:55:30 PM By: Worthy Keeler PA-C Entered By: Worthy Keeler on 02/04/2018 09:05:21 Gregory [name] ([id number]) -------------------------------------------------------------------------------- ROS/PFSH Details Patient Name: Gregory [name] Date of Service: 02/04/2018 8:30 AM Medical Record Number: [id number] Patient Account Number: [id number] Date of Birth/Sex: 1950-04-24 (68 y.o. M) Treating RN: Montey Hora Primary Care Provider: [name] Other Clinician: Referring Provider: [name] Treating Provider/Extender: Melburn Hake, HOYT Weeks in Treatment: 2 Information Obtained From Patient Wound History Do you currently have one or more open woundso Yes How many open wounds do you currently haveo 2 Approximately how long have you had your woundso 6 weeks How have you been treating your wound(s) until nowo dry bandage and compression socks Has your wound(s) ever healed and then re-openedo No Have you had any lab work done in the past montho No Have you tested positive for an antibiotic resistant organism (MRSA, VRE)o No Have you tested positive for osteomyelitis (bone infection)o No Have you had any tests for circulation on your legso No Have you had other problems associated with your woundso Swelling Constitutional Symptoms (General Health) Complaints and Symptoms: Negative for: Fever; Chills Cardiovascular Complaints and  Symptoms: Positive for: LE edema Medical History: Positive for: Hypertension Negative for: Angina; Arrhythmia; Congestive Heart Failure; Coronary Artery Disease; Deep Vein Thrombosis; Hypotension; Myocardial Infarction; Peripheral Arterial Disease; Peripheral Venous Disease; Phlebitis; Vasculitis Past Medical History Notes: aortic insufficiency Eyes Medical History: Positive for: Cataracts - left; right removed Negative for: Glaucoma; Optic Neuritis Ear/Nose/Mouth/Throat Medical History: Negative for: Chronic sinus problems/congestion; Middle ear problems Hematologic/Lymphatic Medical History: Negative for: Anemia; Hemophilia; Human Immunodeficiency Virus; Lymphedema; Sickle Cell Disease Respiratory JASIR, ROTHER ([id number]) Complaints and Symptoms: No Complaints or Symptoms Medical History: Negative  for: Aspiration; Asthma; Chronic Obstructive Pulmonary Disease (COPD); Pneumothorax; Sleep Apnea; Tuberculosis Gastrointestinal Medical History: Negative for: Cirrhosis ; Colitis; Crohnos; Hepatitis A; Hepatitis B; Hepatitis C Endocrine Medical History: Positive for: Type II Diabetes - borderline Negative for: Type I Diabetes Genitourinary Medical History: Negative for: End Stage Renal Disease Immunological Medical History: Negative for: Lupus Erythematosus; Raynaudos; Scleroderma Integumentary (Skin) Medical History: Negative for: History of Burn; History of pressure wounds Musculoskeletal Medical History: Negative for: Gout; Rheumatoid Arthritis; Osteoarthritis; Osteomyelitis Neurologic Medical History: Negative for: Dementia; Neuropathy; Quadriplegia; Paraplegia; Seizure Disorder Oncologic Medical History: Negative for: Received Chemotherapy; Received Radiation Psychiatric Complaints and Symptoms: No Complaints or Symptoms Medical History: Negative for: Anorexia/bulimia; Confinement Anxiety HBO Extended History Items LENNIX, KNEISEL  ([id number]) Eyes: Cataracts Immunizations Pneumococcal Vaccine: Received Pneumococcal Vaccination: No Implantable Devices Family and Social History Cancer: Yes - Siblings,Mother; Diabetes: No; Heart Disease: No; Hereditary Spherocytosis: No; Hypertension: No; Kidney Disease: No; Lung Disease: No; Seizures: No; Stroke: No; Thyroid Problems: No; Tuberculosis: No; Never smoker; Marital Status - Married; Alcohol Use: Moderate; Drug Use: No History; Caffeine Use: Daily; Financial Concerns: No; Food, Clothing or Shelter Needs: No; Support System Lacking: No; Transportation Concerns: No; Advanced Directives: No; Patient does not want information on Advanced Directives Physician Affirmation I have reviewed and agree with the above information. Electronic Signature(s) Signed: 02/04/2018 4:52:45 PM By: Montey Hora Signed: 02/04/2018 5:55:30 PM By: Worthy Keeler PA-C Entered By: Worthy Keeler on 02/04/2018 08:53:25 Gregory [name] ([id number]) -------------------------------------------------------------------------------- SuperBill Details Patient Name: Gregory [name] Date of Service: 02/04/2018 Medical Record Number: [id number] Patient Account Number: [id number] Date of Birth/Sex: 1950-08-17 (68 y.o. M) Treating RN: Montey Hora Primary Care Provider: [name] Other Clinician: Referring Provider: [name] Treating Provider/Extender: Melburn Hake, HOYT Weeks in Treatment: 2 Diagnosis Coding ICD-10 Codes Code Description E11.622 Type 2 diabetes mellitus with other skin ulcer I87.2 Venous insufficiency (chronic) (peripheral) L97.812 Non-pressure chronic ulcer of other part of right lower leg with fat layer exposed L97.822 Non-pressure chronic ulcer of other part of left lower leg with fat layer exposed I10 Essential (primary) hypertension L03.115 Cellulitis of right lower limb Facility Procedures CPT4 Code: [id number] Description: 99213 - WOUND CARE VISIT-LEV 3 EST  PT Modifier: Quantity: 1 Physician Procedures CPT4 Code Description: [id number] - WC PHYS LEVEL 3 - EST PT ICD-10 Diagnosis Description E11.622 Type 2 diabetes mellitus with other skin ulcer I87.2 Venous insufficiency (chronic) (peripheral) L97.812 Non-pressure chronic ulcer of other part of  right lower leg w Y60.600 Non-pressure chronic ulcer of other part of left lower leg wi Modifier: ith fat layer expo th fat layer expos Quantity: 1 sed ed Electronic Signature(s) Signed: 02/04/2018 5:55:30 PM By: Worthy Keeler PA-C Entered By: Worthy Keeler on 02/04/2018 09:05:38

## 2018-02-07 NOTE — Progress Notes (Signed)
ROSE, HIPPLER ([id number]) Visit Report for 02/04/2018 Arrival Information Details Patient Name: Gregory [name], Gregory [name] Date of Service: 02/04/2018 8:30 AM Medical Record Number: [id number] Patient Account Number: [id number] Date of Birth/Sex: 1950-10-16 (68 y.o. M) Treating RN: Montey Hora Primary Care Brodrick Curran: [name] Other Clinician: Referring Eden Toohey: [name] Treating Vinny Taranto/Extender: Melburn Hake, HOYT Weeks in Treatment: 2 Visit Information History Since Last Visit Added or deleted any medications: No Patient Arrived: Cane Any new allergies or adverse reactions: No Arrival Time: 08:28 Had a fall or experienced change in No Accompanied By: wife activities of daily living that may affect Transfer Assistance: None risk of falls: Patient Identification Verified: Yes Signs or symptoms of abuse/neglect since last visito No Secondary Verification Process Yes Hospitalized since last visit: No Completed: Implantable device outside of the clinic excluding No Patient Has Alerts: Yes cellular tissue based products placed in the center Patient Alerts: ABI 01/31/18 L 1.16 R since last visit: 1.14 Has Dressing in Place as Prescribed: Yes TBI L .59 R .58 Pain Present Now: No Electronic Signature(s) Signed: 02/04/2018 4:14:38 PM By: Lorine Bears RCP, RRT, CHT Entered By: Lorine Bears on 02/04/2018 08:31:25 Gregory [name] ([id number]) -------------------------------------------------------------------------------- Clinic Level of Care Assessment Details Patient Name: Gregory [name] Date of Service: 02/04/2018 8:30 AM Medical Record Number: [id number] Patient Account Number: [id number] Date of Birth/Sex: [birth year] (68 y.o. M) Treating RN: Montey Hora Primary Care Adlean Hardeman: [name] Other Clinician: Referring Keelyn Fjelstad: [name] Treating Terald Jump/Extender: Melburn Hake, HOYT Weeks in Treatment: 2 Clinic Level of Care Assessment  Items TOOL 4 Quantity Score []  - Use when only an EandM is performed on FOLLOW-UP visit 0 ASSESSMENTS - Nursing Assessment / Reassessment X - Reassessment of Co-morbidities (includes updates in patient status) 1 10 X- 1 5 Reassessment of Adherence to Treatment Plan ASSESSMENTS - Wound and Skin Assessment / Reassessment X - Simple Wound Assessment / Reassessment - one wound 1 5 []  - 0 Complex Wound Assessment / Reassessment - multiple wounds []  - 0 Dermatologic / Skin Assessment (not related to wound area) ASSESSMENTS - Focused Assessment X - Circumferential Edema Measurements - multi extremities 2 5 []  - 0 Nutritional Assessment / Counseling / Intervention X- 1 5 Lower Extremity Assessment (monofilament, tuning fork, pulses) []  - 0 Peripheral Arterial Disease Assessment (using hand held doppler) ASSESSMENTS - Ostomy and/or Continence Assessment and Care []  - Incontinence Assessment and Management 0 []  - 0 Ostomy Care Assessment and Management (repouching, etc.) PROCESS - Coordination of Care X - Simple Patient / Family Education for ongoing care 1 15 []  - 0 Complex (extensive) Patient / Family Education for ongoing care X- 1 10 Staff obtains Programmer, systems, Records, Test Results / Process Orders []  - 0 Staff telephones HHA, Nursing Homes / Clarify orders / etc []  - 0 Routine Transfer to another Facility (non-emergent condition) []  - 0 Routine Hospital Admission (non-emergent condition) []  - 0 New Admissions / Biomedical engineer / Ordering NPWT, Apligraf, etc. []  - 0 Emergency Hospital Admission (emergent condition) X- 1 10 Simple Discharge Coordination RAYN, ENDERSON ([id number]) []  - 0 Complex (extensive) Discharge Coordination PROCESS - Special Needs []  - Pediatric / Minor Patient Management 0 []  - 0 Isolation Patient Management []  - 0 Hearing / Language / Visual special needs []  - 0 Assessment of Community assistance (transportation, D/C planning, etc.) []   - 0 Additional assistance / Altered mentation []  - 0 Support Surface(s) Assessment (bed, cushion, seat, etc.) INTERVENTIONS - Wound Cleansing / Measurement X -  Simple Wound Cleansing - one wound 1 5 []  - 0 Complex Wound Cleansing - multiple wounds X- 1 5 Wound Imaging (photographs - any number of wounds) []  - 0 Wound Tracing (instead of photographs) X- 1 5 Simple Wound Measurement - one wound []  - 0 Complex Wound Measurement - multiple wounds INTERVENTIONS - Wound Dressings []  - Small Wound Dressing one or multiple wounds 0 []  - 0 Medium Wound Dressing one or multiple wounds []  - 0 Large Wound Dressing one or multiple wounds []  - 0 Application of Medications - topical []  - 0 Application of Medications - injection INTERVENTIONS - Miscellaneous []  - External ear exam 0 []  - 0 Specimen Collection (cultures, biopsies, blood, body fluids, etc.) []  - 0 Specimen(s) / Culture(s) sent or taken to Lab for analysis []  - 0 Patient Transfer (multiple staff / Civil Service fast streamer / Similar devices) []  - 0 Simple Staple / Suture removal (25 or less) []  - 0 Complex Staple / Suture removal (26 or more) []  - 0 Hypo / Hyperglycemic Management (close monitor of Blood Glucose) []  - 0 Ankle / Brachial Index (ABI) - do not check if billed separately X- 1 5 Vital Signs PAOLA, FLYNT. ([id number]) Has the patient been seen at the hospital within the last three years: Yes Total Score: 90 Level Of Care: New/Established - Level 3 Electronic Signature(s) Signed: 02/04/2018 4:52:45 PM By: Montey Hora Entered By: Montey Hora on 02/04/2018 08:59:05 Gregory [name] ([id number]) -------------------------------------------------------------------------------- Encounter Discharge Information Details Patient Name: Gregory [name] Date of Service: 02/04/2018 8:30 AM Medical Record Number: [id number] Patient Account Number: [id number] Date of Birth/Sex: 01/13/1950 (68 y.o. M) Treating RN: Montey Hora Primary Care Kinneth Fujiwara: [name] Other Clinician: Referring Layten Aiken: [name] Treating Deuce Paternoster/Extender: Melburn Hake, HOYT Weeks in Treatment: 2 Encounter Discharge Information Items Discharge Condition: Stable Ambulatory Status: Ambulatory Discharge Destination: Home Transportation: Private Auto Accompanied By: wife Schedule Follow-up Appointment: No Clinical Summary of Care: Electronic Signature(s) Signed: 02/04/2018 4:52:45 PM By: Montey Hora Entered By: Montey Hora on 02/04/2018 09:00:34 Gregory [name] ([id number]) -------------------------------------------------------------------------------- Lower Extremity Assessment Details Patient Name: Gregory [name] Date of Service: 02/04/2018 8:30 AM Medical Record Number: [id number] Patient Account Number: [id number] Date of Birth/Sex: 1950/05/01 (68 y.o. M) Treating RN: Harold Barban Primary Care Shalay Carder: [name] Other Clinician: Referring Thomasena Vandenheuvel: [name] Treating Ayanah Snader/Extender: Melburn Hake, HOYT Weeks in Treatment: 2 Edema Assessment Assessed: [Left: No] [Right: No] Edema: [Left: Yes] [Right: Yes] Calf Left: Right: Point of Measurement: 35 cm From Medial Instep 38.5 cm 39 cm Ankle Left: Right: Point of Measurement: 12 cm From Medial Instep 27.5 cm 27.5 cm Vascular Assessment Pulses: Dorsalis Pedis Palpable: [Left:Yes] [Right:Yes] Posterior Tibial Palpable: [Left:Yes] [Right:Yes] Extremity colors, hair growth, and conditions: Extremity Color: [Left:Normal] [Right:Normal] Hair Growth on Extremity: [Left:Yes] [Right:Yes] Temperature of Extremity: [Left:Warm] [Right:Warm] Capillary Refill: [Left:< 3 seconds] [Right:< 3 seconds] Toe Nail Assessment Left: Right: Thick: Yes Yes Discolored: Yes Yes Deformed: Yes Yes Improper Length and Hygiene: No No Electronic Signature(s) Signed: 02/06/2018 11:43:02 AM By: Harold Barban Entered By: Harold Barban on 02/04/2018  08:45:06 Gregory [name] ([id number]) -------------------------------------------------------------------------------- Multi-Disciplinary Care Plan Details Patient Name: Gregory [name] Date of Service: 02/04/2018 8:30 AM Medical Record Number: [id number] Patient Account Number: [id number] Date of Birth/Sex: 1950/09/18 (68 y.o. M) Treating RN: Montey Hora Primary Care Renne Cornick: [name] Other Clinician: Referring Joanie Duprey: [name] Treating Stephonie Wilcoxen/Extender: Melburn Hake, HOYT Weeks in Treatment: 2 Active Inactive Electronic Signature(s) Signed: 02/04/2018 4:52:45 PM By: Marjory Lies,  Di Kindle Entered By: Montey Hora on 02/04/2018 08:58:22 Gregory [name] ([id number]) -------------------------------------------------------------------------------- Pain Assessment Details Patient Name: SAYID, MOLL Date of Service: 02/04/2018 8:30 AM Medical Record Number: [id number] Patient Account Number: [id number] Date of Birth/Sex: 07-03-1950 (68 y.o. M) Treating RN: Montey Hora Primary Care Tywaun Hiltner: [name] Other Clinician: Referring Merritt Kibby: [name] Treating Reylene Stauder/Extender: Melburn Hake, HOYT Weeks in Treatment: 2 Active Problems Location of Pain Severity and Description of Pain Patient Has Paino No Site Locations Pain Management and Medication Current Pain Management: Electronic Signature(s) Signed: 02/04/2018 4:14:38 PM By: Paulla Fore, RRT, CHT Signed: 02/04/2018 4:52:45 PM By: Montey Hora Entered By: Lorine Bears on 02/04/2018 08:31:37 Gregory [name] ([id number]) -------------------------------------------------------------------------------- Patient/Caregiver Education Details Patient Name: LAROY, MUSTARD. Date of Service: 02/04/2018 8:30 AM Medical Record Number: [id number] Patient Account Number: [id number] Date of Birth/Gender: August 12, 1950 (68 y.o. M) Treating RN: Montey Hora Primary Care Physician:  [name] Other Clinician: Referring Physician: [name] Treating Physician/Extender: Sharalyn Ink in Treatment: 2 Education Assessment Education Provided To: Patient and Caregiver Education Topics Provided Basic Hygiene: Handouts: Other: care of newly healed ulcer site Methods: Explain/Verbal Responses: State content correctly Venous: Handouts: Other: need for ongoing compression Methods: Explain/Verbal Responses: State content correctly Electronic Signature(s) Signed: 02/04/2018 4:52:45 PM By: Montey Hora Entered By: Montey Hora on 02/04/2018 09:01:06 Gregory [name] ([id number]) -------------------------------------------------------------------------------- Wound Assessment Details Patient Name: Gregory [name] Date of Service: 02/04/2018 8:30 AM Medical Record Number: [id number] Patient Account Number: [id number] Date of Birth/Sex: 30-Apr-1950 (68 y.o. M) Treating RN: Montey Hora Primary Care Koltyn Kelsay: [name] Other Clinician: Referring Kawan Valladolid: [name] Treating Janit Cutter/Extender: Melburn Hake, HOYT Weeks in Treatment: 2 Wound Status Wound Number: 2 Primary Etiology: Venous Leg Ulcer Wound Location: Left, Lateral Lower Leg Wound Status: Healed - Epithelialized Wounding Event: Gradually Appeared Comorbid History: Cataracts, Hypertension, Type II Diabetes Date Acquired: 12/30/2017 Weeks Of Treatment: 2 Clustered Wound: No Photos Photo Uploaded By: Harold Barban on 02/04/2018 12:20:44 Wound Measurements Length: (cm) 0 % Red Width: (cm) 0 % Red Depth: (cm) 0 Epith Area: (cm) 0 Tunn Volume: (cm) 0 Unde uction in Area: 100% uction in Volume: 100% elialization: Small (1-33%) eling: No rmining: No Wound Description Full Thickness Without Exposed Support Classification: Structures Wound Margin: Flat and Intact Exudate None Present Amount: Foul Odor After Cleansing: No Slough/Fibrino No Wound Bed Granulation Amount:  None Present (0%) Exposed Structure Necrotic Amount: Small (1-33%) Fascia Exposed: No Necrotic Quality: Eschar Fat Layer (Subcutaneous Tissue) Exposed: Yes Tendon Exposed: No Muscle Exposed: No Joint Exposed: No Bone Exposed: No Periwound Skin Texture Texture Color DENNIES, COATE ([id number]) No Abnormalities Noted: No No Abnormalities Noted: No Callus: No Atrophie Blanche: No Crepitus: No Cyanosis: No Excoriation: No Ecchymosis: No Induration: No Erythema: No Rash: No Hemosiderin Staining: Yes Scarring: No Mottled: No Pallor: No Moisture Rubor: No No Abnormalities Noted: No Dry / Scaly: No Temperature / Pain Maceration: No Temperature: No Abnormality Tenderness on Palpation: Yes Wound Preparation Ulcer Cleansing: Other: soap and water, Topical Anesthetic Applied: None Electronic Signature(s) Signed: 02/04/2018 4:52:45 PM By: Montey Hora Entered By: Montey Hora on 02/04/2018 08:55:51 Gregory [name] ([id number]) -------------------------------------------------------------------------------- Vitals Details Patient Name: Gregory [name] Date of Service: 02/04/2018 8:30 AM Medical Record Number: [id number] Patient Account Number: [id number] Date of Birth/Sex: Feb 21, 1950 (68 y.o. M) Treating RN: Montey Hora Primary Care Aarav Burgett: [name] Other Clinician: Referring Shanekqua Schaper: [name] Treating Jasmane Brockway/Extender: Melburn Hake, HOYT Weeks in Treatment: 2 Vital Signs Time Taken:  08:31 Temperature (F): 97.6 Height (in): 68 Pulse (bpm): 54 Weight (lbs): 285 Respiratory Rate (breaths/min): 16 Body Mass Index (BMI): 43.3 Blood Pressure (mmHg): 132/51 Reference Range: 80 - 120 mg / dl Airway Electronic Signature(s) Signed: 02/04/2018 4:14:38 PM By: Lorine Bears RCP, RRT, CHT Entered By: Lorine Bears on 02/04/2018 08:33:58

## 2018-02-20 DIAGNOSIS — H2512 Age-related nuclear cataract, left eye: Secondary | ICD-10-CM | POA: Diagnosis not present

## 2018-02-20 DIAGNOSIS — H25812 Combined forms of age-related cataract, left eye: Secondary | ICD-10-CM | POA: Diagnosis not present

## 2018-02-20 NOTE — Progress Notes (Signed)
Gregory [name], Gregory [name] ([id number]) Visit Report for 01/27/2018 Chief Complaint Document Details Patient Name: Gregory [name], Gregory [name] Date of Service: 01/27/2018 9:30 AM Medical Record Number: [id number] Patient Account Number: [id number] Date of Birth/Sex: 09-19-1950 (67 y.o. M) Treating RN: Cornell Barman Primary Care Provider: [name] Other Clinician: Referring Provider: [name] Treating Provider/Extender: Melburn Hake, HOYT Weeks in Treatment: 1 Information Obtained from: Patient Chief Complaint Bilateral LE ulcers Electronic Signature(s) Signed: 01/31/2018 9:05:33 PM By: Worthy Keeler PA-C Entered By: Worthy Keeler on 01/27/2018 09:59:00 Gregory [name] ([id number]) -------------------------------------------------------------------------------- HPI Details Patient Name: Gregory [name] Date of Service: 01/27/2018 9:30 AM Medical Record Number: [id number] Patient Account Number: [id number] Date of Birth/Sex: 1950-10-03 (67 y.o. M) Treating RN: Cornell Barman Primary Care Provider: [name] Other Clinician: Referring Provider: [name] Treating Provider/Extender: Melburn Hake, HOYT Weeks in Treatment: 1 History of Present Illness HPI Description: 01/17/18 on evaluation today patient presents for initial inspection concerning wounds that he has of the bilateral lower extremities. This has been an intermittent issue for him and he states that he's had swelling for at least a couple of years that he can remember. 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 He does have a history of hypertension as well as diabetes mellitus type II. His hemoglobin A1c was 8.5 on 01/14/18. Currently he has been putting antibiotic ointment on it and in regard to the right lower extremity using a telfa pad which you can see the outline of where there is some  maceration and skin breakdown. The weeping of the right lower extremity is more circumferential in nature. He is not currently on any oral antibiotic therapy. No fevers, chills, nausea, or vomiting noted at this time. 1/22; the patient was here for a nurse visit today but I was asked to look at the patient because of concerns raised by his wife and daughter who are present. The patient is on Levaquin based on concern of cellulitis in the right lower leg. This was apparently started on Monday. His daughter and wife are concerned about the increasing erythema predominantly on the anterior foot and anterior leg. There does not appear to be a lot of pain here but there is weeping lymphedema fluid coming out of the anterior leg with loss of epithelialization. The entire area looks somewhat angry but there is no palpable tenderness. There is extensive damage on the posterior calf as well. On the left leg he has a small wound that looks improved. The patient has not been systemically unwell no fever no chills. He worked a full day Designer, fashion/clothing I believe]. His daughter is actually a nurse with previous MedSurg training and should be able to change his dressings 01/24/18 on evaluation today patient appears to be doing better in my pinion in regard to his bilateral lower extremity ones/cellulitis. With that being said it sounds like when he came in first nurse visit on Wednesday things were not doing nearly as well as what they are today. In fact his wife states that it is much improved today compared to the way things appeared at that point. Nonetheless they were very worried in fact she tells me that she was "scared". His daughter who is a nurse has actually changed his dressing yesterday for him including the three layer compression wrap. She appeared to do a good job  with this which is excellent news. With that being said we did order wrap supplies for them to have it home just so they  can keep a close eye on this over the next several days as well. Fortunately there is no sign of systemic infection at this time which is good news. The erythema never spread out of the marked locations that we marked last Wednesday. 01/27/18 on evaluation today patient actually appears to be doing excellent in regard to his bilateral lower extremities. Overall I feel like his left lateral wound is almost completely healed and in regard to his right lower extremity he appears to be doing excellent in regard to his swelling. In general I am very pleased. The erythema and cellulitis seems to be completely resolving in regard to the right lower extremity. Electronic Signature(s) Signed: 01/31/2018 9:05:33 PM By: Worthy Keeler PA-C Entered By: Worthy Keeler on 01/31/2018 20:00:41 Gregory [name] ([id number]) -------------------------------------------------------------------------------- Physical Exam Details Patient Name: Gregory [name], Gregory [name] Date of Service: 01/27/2018 9:30 AM Medical Record Number: [id number] Patient Account Number: [id number] Date of Birth/Sex: [birth year] (67 y.o. M) Treating RN: Cornell Barman Primary Care Provider: [name] Other Clinician: Referring Provider: [name] Treating Provider/Extender: Melburn Hake, HOYT Weeks in Treatment: 1 Constitutional Well-nourished and well-hydrated in no acute distress. Respiratory normal breathing without difficulty. clear to auscultation bilaterally. Cardiovascular regular rate and rhythm with normal S1, S2. Psychiatric this patient is able to make decisions and demonstrates good insight into disease process. Alert and Oriented x 3. pleasant and cooperative. Notes Patient's wound bed currently on the left lateral leg showed signs of good granulation no sharp debridement was required today. He's been doing very well with the compression wrap and overall I think this is definitely something I would recommend continuing I fully  expect he will likely be healed next week. Electronic Signature(s) Signed: 01/31/2018 9:05:33 PM By: Worthy Keeler PA-C Entered By: Worthy Keeler on 01/31/2018 20:01:09 Gregory [name] ([id number]) -------------------------------------------------------------------------------- Physician Orders Details Patient Name: Gregory [name], Gregory [name] Date of Service: 01/27/2018 9:30 AM Medical Record Number: [id number] Patient Account Number: [id number] Date of Birth/Sex: Dec 19, 1950 (67 y.o. M) Treating RN: Cornell Barman Primary Care Provider: [name] Other Clinician: Referring Provider: [name] Treating Provider/Extender: Melburn Hake, HOYT Weeks in Treatment: 1 Verbal / Phone Orders: No Diagnosis Coding ICD-10 Coding Code Description E11.622 Type 2 diabetes mellitus with other skin ulcer I87.2 Venous insufficiency (chronic) (peripheral) L97.812 Non-pressure chronic ulcer of other part of right lower leg with fat layer exposed L97.822 Non-pressure chronic ulcer of other part of left lower leg with fat layer exposed I10 Essential (primary) hypertension L03.115 Cellulitis of right lower limb Wound Cleansing Wound #2 Left,Lateral Lower Leg o May shower with protection. Anesthetic (add to Medication List) Wound #2 Left,Lateral Lower Leg o Topical Lidocaine 4% cream applied to wound bed prior to debridement (In Clinic Only). Skin Barriers/Peri-Wound Care Wound #2 Left,Lateral Lower Leg o Moisturizing lotion Primary Wound Dressing Wound #2 Left,Lateral Lower Leg o Silver Alginate Secondary Dressing Wound #2 Left,Lateral Lower Leg o Dry Gauze Dressing Change Frequency Wound #2 Left,Lateral Lower Leg o Change dressing every week Follow-up Appointments Wound #2 Left,Lateral Lower Leg o Return Appointment in 1 week. Edema Control Wound #2 Left,Lateral Lower Leg o 3 Layer Compression System - Bilateral Gregory [name], Gregory [name] ([id number]) o Elevate legs to the level of  the heart and pump ankles as often as possible Electronic Signature(s) Signed: 01/27/2018 4:45:46 PM By: Gretta Cool, BSN, RN,  CWS, Romie Minus, BSN Signed: 01/31/2018 9:05:33 PM By: Worthy Keeler PA-C Entered By: Gretta Cool, BSN, RN, CWS, Kim on 01/27/2018 10:22:46 Gregory [name] ([id number]) -------------------------------------------------------------------------------- Problem List Details Patient Name: KIYOTO, SLOMSKI Date of Service: 01/27/2018 9:30 AM Medical Record Number: [id number] Patient Account Number: [id number] Date of Birth/Sex: Oct 16, 1950 (67 y.o. M) Treating RN: Cornell Barman Primary Care Provider: [name] Other Clinician: Referring Provider: [name] Treating Provider/Extender: Worthy Keeler Weeks in Treatment: 1 Active Problems ICD-10 Evaluated Encounter Code Description Active Date Today Diagnosis E11.622 Type 2 diabetes mellitus with other skin ulcer 01/17/2018 No Yes I87.2 Venous insufficiency (chronic) (peripheral) 01/17/2018 No Yes L97.812 Non-pressure chronic ulcer of other part of right lower leg 01/17/2018 No Yes with fat layer exposed L97.822 Non-pressure chronic ulcer of other part of left lower leg with 01/17/2018 No Yes fat layer exposed I10 Essential (primary) hypertension 01/17/2018 No Yes L03.115 Cellulitis of right lower limb 01/22/2018 No Yes Inactive Problems Resolved Problems Electronic Signature(s) Signed: 01/31/2018 9:05:33 PM By: Worthy Keeler PA-C Entered By: Worthy Keeler on 01/27/2018 09:58:54 Gregory [name] ([id number]) -------------------------------------------------------------------------------- Progress Note Details Patient Name: Gregory [name] Date of Service: 01/27/2018 9:30 AM Medical Record Number: [id number] Patient Account Number: [id number] Date of Birth/Sex: 12/12/1950 (67 y.o. M) Treating RN: Cornell Barman Primary Care Provider: [name] Other Clinician: Referring Provider: [name] Treating  Provider/Extender: Melburn Hake, HOYT Weeks in Treatment: 1 Subjective Chief Complaint Information obtained from Patient Bilateral LE ulcers History of Present Illness (HPI) 01/17/18 on evaluation today patient presents for initial inspection concerning wounds that he has of the bilateral lower extremities. This has been an intermittent issue for him and he states that he's had swelling for at least a couple of years that he can remember. 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 He does have a history of hypertension as well as diabetes mellitus type II. His hemoglobin A1c was 8.5 on 01/14/18. Currently he has been putting antibiotic ointment on it and in regard to the right lower extremity using a telfa pad which you can see the outline of where there is some maceration and skin breakdown. The weeping of the right lower extremity is more circumferential in nature. He is not currently on any oral antibiotic therapy. No fevers, chills, nausea, or vomiting noted at this time. 1/22; the patient was here for a nurse visit today but I was asked to look at the patient because of concerns raised by his wife and daughter who are present. The patient is on Levaquin based on concern of cellulitis in the right lower leg. This was apparently started on Monday. His daughter and wife are concerned about the increasing erythema predominantly on the anterior foot and anterior leg. There does not appear to be a lot of pain here but there is weeping lymphedema fluid coming out of the anterior leg with loss of epithelialization. The entire area looks somewhat angry but there is no palpable tenderness. There is extensive damage on the posterior calf as well. On the left leg he has a small wound that looks improved. The patient has not been systemically unwell no fever no  chills. He worked a full day Designer, fashion/clothing I believe]. His daughter is actually a nurse with previous MedSurg training and should be able to change his dressings 01/24/18 on  evaluation today patient appears to be doing better in my pinion in regard to his bilateral lower extremity ones/cellulitis. With that being said it sounds like when he came in first nurse visit on Wednesday things were not doing nearly as well as what they are today. In fact his wife states that it is much improved today compared to the way things appeared at that point. Nonetheless they were very worried in fact she tells me that she was "scared". His daughter who is a nurse has actually changed his dressing yesterday for him including the three layer compression wrap. She appeared to do a good job with this which is excellent news. With that being said we did order wrap supplies for them to have it home just so they can keep a close eye on this over the next several days as well. Fortunately there is no sign of systemic infection at this time which is good news. The erythema never spread out of the marked locations that we marked last Wednesday. 01/27/18 on evaluation today patient actually appears to be doing excellent in regard to his bilateral lower extremities. Overall I feel like his left lateral wound is almost completely healed and in regard to his right lower extremity he appears to be doing excellent in regard to his swelling. In general I am very pleased. The erythema and cellulitis seems to be completely resolving in regard to the right lower extremity. Patient History Information obtained from Patient. Family History Cancer - Siblings,Mother, No family history of Diabetes, Heart Disease, Hereditary Spherocytosis, Hypertension, Kidney Disease, Lung Disease, Seizures, Stroke, Thyroid Problems, Tuberculosis. Gregory [name], Gregory [name] ([id number]) Social History Never smoker, Marital Status -  Married, Alcohol Use - Moderate, Drug Use - No History, Caffeine Use - Daily. Medical History Eyes Patient has history of Cataracts - left; right removed Denies history of Glaucoma, Optic Neuritis Ear/Nose/Mouth/Throat Denies history of Chronic sinus problems/congestion, Middle ear problems Hematologic/Lymphatic Denies history of Anemia, Hemophilia, Human Immunodeficiency Virus, Lymphedema, Sickle Cell Disease Respiratory Denies history of Aspiration, Asthma, Chronic Obstructive Pulmonary Disease (COPD), Pneumothorax, Sleep Apnea, Tuberculosis Cardiovascular Patient has history of Hypertension Denies history of Angina, Arrhythmia, Congestive Heart Failure, Coronary Artery Disease, Deep Vein Thrombosis, Hypotension, Myocardial Infarction, Peripheral Arterial Disease, Peripheral Venous Disease, Phlebitis, Vasculitis Gastrointestinal Denies history of Cirrhosis , Colitis, Crohn s, Hepatitis A, Hepatitis B, Hepatitis C Endocrine Patient has history of Type II Diabetes - borderline Denies history of Type I Diabetes Genitourinary Denies history of End Stage Renal Disease Immunological Denies history of Lupus Erythematosus, Raynaud s, Scleroderma Integumentary (Skin) Denies history of History of Burn, History of pressure wounds Musculoskeletal Denies history of Gout, Rheumatoid Arthritis, Osteoarthritis, Osteomyelitis Neurologic Denies history of Dementia, Neuropathy, Quadriplegia, Paraplegia, Seizure Disorder Oncologic Denies history of Received Chemotherapy, Received Radiation Psychiatric Denies history of Anorexia/bulimia, Confinement Anxiety Medical And Surgical History Notes Cardiovascular aortic insufficiency Review of Systems (ROS) Constitutional Symptoms (General Health) Denies complaints or symptoms of Fever, Chills. Respiratory The patient has no complaints or symptoms. Cardiovascular Complains or has symptoms of LE edema. Psychiatric The patient has no complaints or  symptoms. Gregory [name], Gregory [name] ([id number]) Objective Constitutional Well-nourished and well-hydrated in no acute distress. Vitals Time Taken: 9:27 AM, Height: 68 in, Weight: 285 lbs, BMI: 43.3, Temperature: 97.6 F, Pulse: 71 bpm, Respiratory Rate: 16 breaths/min, Blood Pressure: 130/55 mmHg. Respiratory normal breathing without difficulty. clear to auscultation bilaterally. Cardiovascular regular rate and rhythm with normal S1, S2. Psychiatric this patient is able to make decisions and demonstrates good  insight into disease process. Alert and Oriented x 3. pleasant and cooperative. General Notes: Patient's wound bed currently on the left lateral leg showed signs of good granulation no sharp debridement was required today. He's been doing very well with the compression wrap and overall I think this is definitely something I would recommend continuing I fully expect he will likely be healed next week. Integumentary (Hair, Skin) Wound #1 status is Healed - Epithelialized. Original cause of wound was Gradually Appeared. The wound is located on the Right,Circumferential Lower Leg. The wound measures 0cm length x 0cm width x 0cm depth; 0cm^2 area and 0cm^3 volume. The wound is limited to skin breakdown. There is no tunneling or undermining noted. There is a none present amount of drainage noted. The wound margin is indistinct and nonvisible. There is no granulation within the wound bed. There is no necrotic tissue within the wound bed. The periwound skin appearance exhibited: Excoriation, Maceration, Hemosiderin Staining, Erythema. The periwound skin appearance did not exhibit: Callus, Crepitus, Induration, Rash, Scarring, Dry/Scaly, Atrophie Blanche, Cyanosis, Ecchymosis, Mottled, Pallor, Rubor. The surrounding wound skin color is noted with erythema. Periwound temperature was noted as No Abnormality. The periwound has tenderness on palpation. Wound #2 status is Open. Original cause of wound  was Gradually Appeared. The wound is located on the Left,Lateral Lower Leg. The wound measures 0.9cm length x 0.5cm width x 0.1cm depth; 0.353cm^2 area and 0.035cm^3 volume. There is Fat Layer (Subcutaneous Tissue) Exposed exposed. There is no tunneling or undermining noted. There is a small amount of serous drainage noted. The wound margin is flat and intact. There is no granulation within the wound bed. There is a small (1-33%) amount of necrotic tissue within the wound bed including Eschar. The periwound skin appearance exhibited: Hemosiderin Staining. The periwound skin appearance did not exhibit: Callus, Crepitus, Excoriation, Induration, Rash, Scarring, Dry/Scaly, Maceration, Atrophie Blanche, Cyanosis, Ecchymosis, Mottled, Pallor, Rubor, Erythema. Periwound temperature was noted as No Abnormality. The periwound has tenderness on palpation. Other Condition(s) Patient presents with Lymphedema located on the Right Leg. The skin appearance exhibited: Dry/Scaly, Rubor. The skin appearance did not exhibit: Atrophie Blanche, Callus, Crepitus, Cyanosis, Ecchymosis, Erythema, Excoriation, Friable, Hemosiderin Staining, Induration, Maceration, Mottled, Pallor, Rash, Scarring. Assessment Active Problems ICD-10 Type 2 diabetes mellitus with other skin ulcer Venous insufficiency (chronic) (peripheral) Gregory [name], LAMOUREAUX. ([id number]) Non-pressure chronic ulcer of other part of right lower leg with fat layer exposed Non-pressure chronic ulcer of other part of left lower leg with fat layer exposed Essential (primary) hypertension Cellulitis of right lower limb Plan Wound Cleansing: Wound #2 Left,Lateral Lower Leg: May shower with protection. Anesthetic (add to Medication List): Wound #2 Left,Lateral Lower Leg: Topical Lidocaine 4% cream applied to wound bed prior to debridement (In Clinic Only). Skin Barriers/Peri-Wound Care: Wound #2 Left,Lateral Lower Leg: Moisturizing lotion Primary Wound  Dressing: Wound #2 Left,Lateral Lower Leg: Silver Alginate Secondary Dressing: Wound #2 Left,Lateral Lower Leg: Dry Gauze Dressing Change Frequency: Wound #2 Left,Lateral Lower Leg: Change dressing every week Follow-up Appointments: Wound #2 Left,Lateral Lower Leg: Return Appointment in 1 week. Edema Control: Wound #2 Left,Lateral Lower Leg: 3 Layer Compression System - Bilateral Elevate legs to the level of the heart and pump ankles as often as possible My suggestion currently is gonna be that we go ahead and continue with the above wound care measures the patient is in agreement the plan. If anything changes or worsens meantime he or his family will contact the office to let me know. We will  see him for one more nurse visit rapid change on Friday just to see that everything is still okay and ensure there are no complications or concerns. He is in agreement that plan. Please see above for specific wound care orders. We will see patient for re-evaluation in 1 week(s) here in the clinic. If anything worsens or changes patient will contact our office for additional recommendations. Electronic Signature(s) Signed: 01/31/2018 9:05:33 PM By: Worthy Keeler PA-C Entered By: Worthy Keeler on 01/31/2018 20:01:36 Gregory [name] ([id number]) -------------------------------------------------------------------------------- ROS/PFSH Details Patient Name: Gregory [name] Date of Service: 01/27/2018 9:30 AM Medical Record Number: [id number] Patient Account Number: [id number] Date of Birth/Sex: Jul 25, 1950 (67 y.o. M) Treating RN: Cornell Barman Primary Care Provider: [name] Other Clinician: Referring Provider: [name] Treating Provider/Extender: Melburn Hake, HOYT Weeks in Treatment: 1 Information Obtained From Patient Wound History Do you currently have one or more open woundso Yes How many open wounds do you currently haveo 2 Approximately how long have you had your woundso 6  weeks How have you been treating your wound(s) until nowo dry bandage and compression socks Has your wound(s) ever healed and then re-openedo No Have you had any lab work done in the past montho No Have you tested positive for an antibiotic resistant organism (MRSA, VRE)o No Have you tested positive for osteomyelitis (bone infection)o No Have you had any tests for circulation on your legso No Have you had other problems associated with your woundso Swelling Constitutional Symptoms (General Health) Complaints and Symptoms: Negative for: Fever; Chills Cardiovascular Complaints and Symptoms: Positive for: LE edema Medical History: Positive for: Hypertension Negative for: Angina; Arrhythmia; Congestive Heart Failure; Coronary Artery Disease; Deep Vein Thrombosis; Hypotension; Myocardial Infarction; Peripheral Arterial Disease; Peripheral Venous Disease; Phlebitis; Vasculitis Past Medical History Notes: aortic insufficiency Eyes Medical History: Positive for: Cataracts - left; right removed Negative for: Glaucoma; Optic Neuritis Ear/Nose/Mouth/Throat Medical History: Negative for: Chronic sinus problems/congestion; Middle ear problems Hematologic/Lymphatic Medical History: Negative for: Anemia; Hemophilia; Human Immunodeficiency Virus; Lymphedema; Sickle Cell Disease Respiratory Gregory [name], Gregory [name] ([id number]) Complaints and Symptoms: No Complaints or Symptoms Medical History: Negative for: Aspiration; Asthma; Chronic Obstructive Pulmonary Disease (COPD); Pneumothorax; Sleep Apnea; Tuberculosis Gastrointestinal Medical History: Negative for: Cirrhosis ; Colitis; Crohnos; Hepatitis A; Hepatitis B; Hepatitis C Endocrine Medical History: Positive for: Type II Diabetes - borderline Negative for: Type I Diabetes Genitourinary Medical History: Negative for: End Stage Renal Disease Immunological Medical History: Negative for: Lupus Erythematosus; Raynaudos;  Scleroderma Integumentary (Skin) Medical History: Negative for: History of Burn; History of pressure wounds Musculoskeletal Medical History: Negative for: Gout; Rheumatoid Arthritis; Osteoarthritis; Osteomyelitis Neurologic Medical History: Negative for: Dementia; Neuropathy; Quadriplegia; Paraplegia; Seizure Disorder Oncologic Medical History: Negative for: Received Chemotherapy; Received Radiation Psychiatric Complaints and Symptoms: No Complaints or Symptoms Medical History: Negative for: Anorexia/bulimia; Confinement Anxiety HBO Extended History Items DELMAS, Gregory [name] ([id number]) Eyes: Cataracts Immunizations Pneumococcal Vaccine: Received Pneumococcal Vaccination: No Implantable Devices Family and Social History Cancer: Yes - Siblings,Mother; Diabetes: No; Heart Disease: No; Hereditary Spherocytosis: No; Hypertension: No; Kidney Disease: No; Lung Disease: No; Seizures: No; Stroke: No; Thyroid Problems: No; Tuberculosis: No; Never smoker; Marital Status - Married; Alcohol Use: Moderate; Drug Use: No History; Caffeine Use: Daily; Financial Concerns: No; Food, Clothing or Shelter Needs: No; Support System Lacking: No; Transportation Concerns: No; Advanced Directives: No; Patient does not want information on Advanced Directives Physician Affirmation I have reviewed and agree with the above information. Electronic Signature(s) Signed: 01/31/2018 9:05:33 PM By: Worthy Keeler  PA-C Signed: 02/20/2018 7:49:08 AM By: Gretta Cool, BSN, RN, CWS, Kim RN, BSN Entered By: Worthy Keeler on 01/31/2018 20:00:57 Gregory [name] ([id number]) -------------------------------------------------------------------------------- SuperBill Details Patient Name: Gregory [name], Gregory [name] Date of Service: 01/27/2018 Medical Record Number: [id number] Patient Account Number: [id number] Date of Birth/Sex: 1950-11-24 (68 y.o. M) Treating RN: Cornell Barman Primary Care Provider: [name] Other  Clinician: Referring Provider: [name] Treating Provider/Extender: Melburn Hake, HOYT Weeks in Treatment: 1 Diagnosis Coding ICD-10 Codes Code Description E11.622 Type 2 diabetes mellitus with other skin ulcer I87.2 Venous insufficiency (chronic) (peripheral) L97.812 Non-pressure chronic ulcer of other part of right lower leg with fat layer exposed L97.822 Non-pressure chronic ulcer of other part of left lower leg with fat layer exposed I10 Essential (primary) hypertension L03.115 Cellulitis of right lower limb Facility Procedures CPT4: Description Modifier Quantity Code [id number] BILATERAL: Application of multi-layer venous compression system; leg (below 1 knee), including ankle and foot. Physician Procedures CPT4 Code Description: [id number] - WC PHYS LEVEL 4 - EST PT ICD-10 Diagnosis Description E11.622 Type 2 diabetes mellitus with other skin ulcer I87.2 Venous insufficiency (chronic) (peripheral) L97.812 Non-pressure chronic ulcer of other part of  right lower leg w L97.822 Non-pressure chronic ulcer of other part of left lower leg wi Modifier: ith fat layer expo th fat layer expos Quantity: 1 sed ed Electronic Signature(s) Signed: 01/31/2018 9:05:33 PM By: Worthy Keeler PA-C Previous Signature: 01/27/2018 4:45:46 PM Version By: Gretta Cool, BSN, RN, CWS, Kim RN, BSN Entered By: Worthy Keeler on 01/27/2018 23:42:06

## 2018-03-19 DIAGNOSIS — H524 Presbyopia: Secondary | ICD-10-CM | POA: Diagnosis not present

## 2018-03-20 ENCOUNTER — Encounter (INDEPENDENT_AMBULATORY_CARE_PROVIDER_SITE_OTHER): Payer: Medicare HMO

## 2018-03-20 ENCOUNTER — Other Ambulatory Visit: Payer: Self-pay

## 2018-03-27 ENCOUNTER — Ambulatory Visit (INDEPENDENT_AMBULATORY_CARE_PROVIDER_SITE_OTHER): Payer: Medicare HMO | Admitting: Family Medicine

## 2018-04-10 ENCOUNTER — Ambulatory Visit (INDEPENDENT_AMBULATORY_CARE_PROVIDER_SITE_OTHER): Payer: Medicare HMO | Admitting: Family Medicine

## 2018-04-21 ENCOUNTER — Other Ambulatory Visit: Payer: Self-pay | Admitting: Family Medicine

## 2018-05-23 ENCOUNTER — Encounter: Payer: Self-pay | Admitting: Family Medicine

## 2018-05-23 ENCOUNTER — Ambulatory Visit (INDEPENDENT_AMBULATORY_CARE_PROVIDER_SITE_OTHER): Payer: Medicare HMO | Admitting: Family Medicine

## 2018-05-23 VITALS — Ht 67.0 in | Wt 270.0 lb

## 2018-05-23 DIAGNOSIS — I519 Heart disease, unspecified: Secondary | ICD-10-CM

## 2018-05-23 DIAGNOSIS — I890 Lymphedema, not elsewhere classified: Secondary | ICD-10-CM

## 2018-05-23 DIAGNOSIS — I5189 Other ill-defined heart diseases: Secondary | ICD-10-CM

## 2018-05-23 MED ORDER — ALPRAZOLAM 0.5 MG PO TABS: 0.5000 mg | ORAL_TABLET | Freq: Every day | ORAL | 0 refills | Status: DC | PRN

## 2018-05-23 NOTE — Assessment & Plan Note (Signed)
As well as venous insufficiency   INcrease lasix for 3 days, continue elevation, increase to multiple times daily, wear  compression hose, decrease salt, wors more aggressively on weight loss and increase exercise.  Pt will call/email with update in several days.

## 2018-05-23 NOTE — Progress Notes (Signed)
VIRTUAL VISIT Due to national recommendations of social distancing due to [place] 19, a virtual visit is felt to be most appropriate for this patient at this time.   I connected with the patient on 05/23/18 at  8:00 AM EDT by virtual telehealth platform and verified that I am speaking with the correct person using two identifiers.   I discussed the limitations, risks, security and privacy concerns of performing an evaluation and management service by  virtual telehealth platform and the availability of in person appointments. I also discussed with the patient that there may be a patient responsible charge related to this service. The patient expressed understanding and agreed to proceed.  Patient location: Home Provider Location: [facility] Participants: Gregory [name] and [name]   Chief Complaint  Patient presents with  . Leg Swelling    was diagnosed with lymphedema 6 months ago. Cleared up. About 2 weeks ago he got a "nic" on his leg and legs are swelling even with compression hoses.    History of Present Illness: 68 year old male pt with HTN, AS, AI, morbid obesity  lymphedema and well controlled diabetes Treated wounds on legs in past with wound care center, treated edema with compression hose,elevation and lasix  Lab Results  Component Value Date   HGBA1C 5.8 01/14/2018    Today he reports that 2 weeks ago his peripheral edema returned significantly;y worse in last 3-5 days despite compression hose.  Worst at ngiht, improves after elevation and when waking in AM.   He has been eating more in last 2 month with covid pandemic. Walking 1 miles daily though. May be eating more salt. No SOB, no cheat pain, some increase in fatigue. He is taking lasix 40 mg daily. Not following weights at home.   He also has an area that he scratched 2 weeks ago on left lower leg.. scabbed and healing, no discharged.  No surrounding erythema.   135/73 He has been more anxious  with covid 19... has used alprazolam once a week in last several months... requests refill.  Previously used the mdd for plane flight anxiety.  Denies depression or generalized anxiety. Not interested in other medications at this time.  COVID 19 screen No recent travel or known exposure to COVID19 The patient denies respiratory symptoms of COVID 19 at this time.  The importance of social distancing was discussed today.   Review of Systems  Constitutional: Negative for chills and fever.  HENT: Negative for congestion and ear pain.   Eyes: Negative for pain and redness.  Respiratory: Negative for cough and shortness of breath.   Cardiovascular: Positive for leg swelling. Negative for chest pain and palpitations.  Gastrointestinal: Negative for abdominal pain, blood in stool, constipation, diarrhea, nausea and vomiting.  Genitourinary: Negative for dysuria.  Musculoskeletal: Negative for falls and myalgias.  Skin: Negative for rash.  Neurological: Negative for dizziness.  Psychiatric/Behavioral: Negative for depression. The patient is nervous/anxious.       Past Medical History:  Diagnosis Date  . Aortic insufficiency   . Hypertension   . Shingles     reports that he has never smoked. He has never used smokeless tobacco. He reports current alcohol use of about 5.0 standard drinks of alcohol per week. He reports that he does not use drugs.   Current Outpatient Medications:  .  ALPRAZolam (XANAX) 0.5 MG tablet, Take 1 tablet (0.5 mg total) by mouth daily as needed for anxiety (30 minprior to plane floght).,  Disp: 20 tablet, Rfl: 0 .  amLODipine (NORVASC) 10 MG tablet, TAKE 1 TABLET BY MOUTH ONCE DAILY, Disp: 90 tablet, Rfl: 1 .  atorvastatin (LIPITOR) 40 MG tablet, Take 1 tablet by mouth once daily, Disp: 90 tablet, Rfl: 2 .  furosemide (LASIX) 40 MG tablet, Take 1 tablet (40 mg total) by mouth daily., Disp: 90 tablet, Rfl: 3 .  ibuprofen (ADVIL,MOTRIN) 200 MG tablet, Take 800 mg by  mouth 2 (two) times daily., Disp: , Rfl:  .  lisinopril (PRINIVIL,ZESTRIL) 40 MG tablet, TAKE 1 TABLET BY MOUTH DAILY, Disp: 90 tablet, Rfl: 1 .  potassium chloride SA (K-DUR,KLOR-CON) 20 MEQ tablet, Take 1 tablet (20 mEq total) by mouth daily., Disp: 90 tablet, Rfl: 3   Observations/Objective: Height 5\' 7"  (1.702 m), weight 270 lb (122.5 kg).  Physical Exam Physical Exam Constitutional:      General: The patient is not in acute distress.Morbidly obese Pulmonary:     Effort: Pulmonary effort is normal. No respiratory distress.  Neurological:     Mental Status: The patient is alert and oriented to person, place, and time.  Psychiatric:        Mood and Affect: Mood normal.        Behavior: Behavior normal.  Currently minimal edema in bialteral ancles given it is early AM, small scab on elft lower leg without redness.  Assessment and Plan Lymphedema As well as venous insufficiency   INcrease lasix for 3 days, continue elevation, increase to multiple times daily, wear  compression hose, decrease salt, wors more aggressively on weight loss and increase exercise.  Pt will call/email with update in several days.  Grade I diastolic dysfunction No current SOB or chest pain despite fluid overload.     I discussed the assessment and treatment plan with the patient. The patient was provided an opportunity to ask questions and all were answered. The patient agreed with the plan and demonstrated an understanding of the instructions.   The patient was advised to call back or seek an in-person evaluation if the symptoms worsen or if the condition fails to improve as anticipated.     Gregory Lofts, MD

## 2018-05-23 NOTE — Patient Instructions (Addendum)
Increase lasix to  Extra 1/2 tab daily x 3 days  But if no improvement with that dose increase to extra 1 tab daily ( for total of 2 tabs daily ) x 3 days. Continue elevation, increase to multiple times daily, wear  compression hose, decrease salt, work more aggressively on weight loss and increased exercise.   Edema  Edema is an abnormal buildup of fluids in the body tissues and under the skin. Swelling of the legs, feet, and ankles is a common symptom that becomes more likely as you get older. Swelling is also common in looser tissues, like around the eyes. When the affected area is squeezed, the fluid may move out of that spot and leave a dent for a few moments. This dent is called pitting edema. There are many possible causes of edema. Eating too much salt (sodium) and being on your feet or sitting for a long time can cause edema in your legs, feet, and ankles. Hot weather may make edema worse. Common causes of edema include:  Heart failure.  Liver or kidney disease.  Weak leg blood vessels.  Cancer.  An injury.  Pregnancy.  Medicines.  Being obese.  Low protein levels in the blood. Edema is usually painless. Your skin may look swollen or shiny. Follow these instructions at home:  Keep the affected body part raised (elevated) above the level of your heart when you are sitting or lying down.  Do not sit still or stand for long periods of time.  Do not wear tight clothing. Do not wear garters on your upper legs.  Exercise your legs to get your circulation going. This helps to move the fluid back into your blood vessels, and it may help the swelling go down.  Wear elastic bandages or support stockings to reduce swelling as told by your health care provider.  Eat a low-salt (low-sodium) diet to reduce fluid as told by your health care provider.  Depending on the cause of your swelling, you may need to limit how much fluid you drink (fluid restriction).  Take  over-the-counter and prescription medicines only as told by your health care provider. Contact a health care provider if:  Your edema does not get better with treatment.  You have heart, liver, or kidney disease and have symptoms of edema.  You have sudden and unexplained weight gain. Get help right away if:  You develop shortness of breath or chest pain.  You cannot breathe when you lie down.  You develop pain, redness, or warmth in the swollen areas.  You have heart, liver, or kidney disease and suddenly get edema.  You have a fever and your symptoms suddenly get worse. Summary  Edema is an abnormal buildup of fluids in the body tissues and under the skin.  Eating too much salt (sodium) and being on your feet or sitting for a long time can cause edema in your legs, feet, and ankles.  Keep the affected body part raised (elevated) above the level of your heart when you are sitting or lying down. This information is not intended to replace advice given to you by your health care provider. Make sure you discuss any questions you have with your health care provider. Document Released: 12/18/2004 Document Revised: 01/21/2016 Document Reviewed: 01/21/2016 Elsevier Interactive Patient Education  2019 [employer].

## 2018-05-23 NOTE — Assessment & Plan Note (Signed)
No current SOB or chest pain despite fluid overload.

## 2018-07-30 ENCOUNTER — Other Ambulatory Visit: Payer: Self-pay | Admitting: Family Medicine

## 2018-07-30 DIAGNOSIS — I10 Essential (primary) hypertension: Secondary | ICD-10-CM

## 2018-08-03 ENCOUNTER — Encounter: Payer: Self-pay | Admitting: Adult Health

## 2018-08-03 NOTE — Progress Notes (Signed)
Cardiology Office Note   Date:  08/04/2018   ID:  Gregory [name], Gregory [name] [birth year], MRN [id number]  PCP:  Jinny Sanders, MD  Cardiologist: Dr. Percival Spanish Follow Up   History of Present Illness: Gregory [name] is a 68 y.o. male who presents for ongoing assessment and management of aortic valve disease with moderate left ear and AI per echocardiogram in December 2019.  The patient is also being treated for hypertension with prior history of morbid obesity, OSA, anxiety associated with flying and shingles.  Patient was last seen in the office on 01/13/2018 he was completely asymptomatic concerning his aortic valve disease.  Plan was to repeat echocardiogram for close surveillance of aortic valve disease.  Due to obesity the patient was referred to the medical weight loss clinic.  Lower extremity edema was noted and felt to be related to venous insufficiency.  He is here for follow-up to evaluate his status.  He comes today without any significant complaints.  He is medically compliant.  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  The patient denies significant shortness of breath or chest pain with exertion.  He lost about 20 pounds but put the weight back on within the last couple of months since the [place] pandemic.  He is uncertain what his blood glucoses been running.   Past Medical History:  Diagnosis Date  . Anxiety with flying   . Aortic insufficiency   . Hypertension   . OSA (obstructive sleep apnea)   . Shingles     Past Surgical History:  Procedure Laterality Date  . None       Current Outpatient Medications  Medication Sig Dispense Refill  . ALPRAZolam (XANAX) 0.5 MG tablet Take 1 tablet (0.5 mg total) by mouth daily as needed for anxiety (30 min prior to plane flight or prn anxiety). 20 tablet 0  . amLODipine (NORVASC) 10 MG tablet Take 1 tablet by mouth once  daily 90 tablet 1  . atorvastatin (LIPITOR) 40 MG tablet Take 1 tablet by mouth once daily 90 tablet 2  . furosemide (LASIX) 40 MG tablet Take 1 tablet (40 mg total) by mouth daily. 90 tablet 3  . ibuprofen (ADVIL,MOTRIN) 200 MG tablet Take 800 mg by mouth 2 (two) times daily.    [name] lisinopril (ZESTRIL) 40 MG tablet Take 1 tablet by mouth once daily 90 tablet 1  . potassium chloride SA (K-DUR,KLOR-CON) 20 MEQ tablet Take 1 tablet (20 mEq total) by mouth daily. 90 tablet 3   No current facility-administered medications for this visit.     Allergies:   Patient has no known allergies.    Social History:  The patient  reports that he has never smoked. He has never used smokeless tobacco. He reports current alcohol use of about 5.0 standard drinks of alcohol per week. He reports that he does not use drugs.   Family History:  The patient's family history includes Alcohol abuse in his father; Breast cancer in his sister; Cancer in his mother. He was adopted.    ROS: All other systems are reviewed and negative. Unless otherwise mentioned in H&P    PHYSICAL EXAM: VS:  BP (!) 146/60   Pulse (!) 56   Ht 5\' 7"  (1.702 m)   Wt 278 lb 6.4 oz (126.3 kg)   SpO2 94%   BMI 43.60 kg/m  ,  BMI Body mass index is 43.6 kg/m. GEN: Well nourished, well developed, in no acute distress HEENT: normal Neck: no JVD, carotid bruits, or masses Cardiac: RRR; 2/6 harsh holosystolic murmur with preserved S2, radiation to the carotids bilaterally louder on the left than the right, with abdominal bruits auscultated rubs, or gallops,no edema  Respiratory:  Clear to auscultation bilaterally, normal work of breathing GI: soft, nontender, nondistended, + BS MS: no deformity or atrophy Skin: warm and dry, no rash Neuro:  Strength and sensation are intact Psych: euthymic mood, full affect   EKG: Not completed this office visit.  Recent Labs: 01/14/2018: ALT 13; BUN 16; Creatinine, Ser 0.80; Potassium 4.3; Sodium 138     Lipid Panel    Component Value Date/Time   CHOL 156 01/14/2018 0752   TRIG 129.0 01/14/2018 0752   TRIG 162 (H) 10/25/2005 1507   HDL 56.20 01/14/2018 0752   CHOLHDL 3 01/14/2018 0752   VLDL 25.8 01/14/2018 0752   LDLCALC 74 01/14/2018 0752   LDLDIRECT 151.0 01/05/2016 0748      Wt Readings from Last 3 Encounters:  08/04/18 278 lb 6.4 oz (126.3 kg)  05/23/18 270 lb (122.5 kg)  01/13/18 291 lb (132 kg)      Other studies Reviewed: Echocardiogram 12-22-2018  Left ventricle: The cavity size was severely dilated. There was   mild concentric hypertrophy. Systolic function was normal. The   estimated ejection fraction was in the range of 55% to 60%. Wall   motion was normal; there were no regional wall motion   abnormalities. There was an increased relative contribution of   atrial contraction to ventricular filling. Doppler parameters are   consistent with abnormal left ventricular relaxation (grade 1   diastolic dysfunction). Doppler parameters are consistent with   high ventricular filling pressure. - Aortic valve: Trileaflet; severely thickened, severely calcified   leaflets. Valve mobility was restricted. There was moderate   stenosis. There was moderate regurgitation. Mean gradient (S): 20   mm Hg. VTI ratio of LVOT to aortic valve: 0.38. Valve area (VTI):   1.89 cm^2. Valve area (Vmax): 1.71 cm^2. Valve area (Vmean): 1.78   cm^2. Regurgitation pressure half-time: 491 ms. - Aorta: Aortic root dimension: 44 mm (ED). Ascending aorta   diameter: 4.5 mm (ED). - Aortic root: The aortic root was moderately dilated. - Ascending aorta: The ascending aorta was moderately dilated. - Mitral valve: Calcified annulus. - Left atrium: The atrium was severely dilated. - Right ventricle: The cavity size was moderately dilated. Wall   thickness was normal.  ASSESSMENT AND PLAN:  1.  Aortic valve stenosis: Per echocardiogram in December 2020 he had moderate stenosis with moderate  regurgitation.  Aortic valve area 1.89 cm by VTI, and 1.71 by Vmax.  I will repeat his echocardiogram for progression of aortic valve stenosis in comparison to echocardiogram completed in December.  He is currently asymptomatic and continues to climb stairs even though he is having some shortness of breath and feeling fatigued afterwards.  He states this is nothing new and chalks up to his weight.  2.  Hypertension: Slightly elevated today.  The patient states it is normally lower that he was running late and the traffic was more congested than usual.  We will not make any changes at this time unless he has persistently elevated blood pressures.  3.  Hypercholesterolemia: He continues on atorvastatin 40 mg daily.  He will have fasting lipids LFTs, BMET ordered today.   Current medicines are reviewed at  length with the patient today.    Labs/ tests ordered today include: Fasting lipids LFTs, BMET, echocardiogram.  Phill Myron. West Pugh, ANP, AACC   08/04/2018 11:06 AM    [place] [address] Office [phone] Fax [phone]

## 2018-08-04 ENCOUNTER — Encounter: Payer: Self-pay | Admitting: Adult Health

## 2018-08-04 ENCOUNTER — Other Ambulatory Visit: Payer: Self-pay

## 2018-08-04 ENCOUNTER — Ambulatory Visit: Payer: Medicare HMO | Admitting: Adult Health

## 2018-08-04 VITALS — BP 146/60 | HR 56 | Ht 67.0 in | Wt 278.4 lb

## 2018-08-04 DIAGNOSIS — I350 Nonrheumatic aortic (valve) stenosis: Secondary | ICD-10-CM

## 2018-08-04 DIAGNOSIS — E7800 Pure hypercholesterolemia, unspecified: Secondary | ICD-10-CM | POA: Diagnosis not present

## 2018-08-04 DIAGNOSIS — I10 Essential (primary) hypertension: Secondary | ICD-10-CM | POA: Diagnosis not present

## 2018-08-04 DIAGNOSIS — Z79899 Other long term (current) drug therapy: Secondary | ICD-10-CM

## 2018-08-04 LAB — HEPATIC FUNCTION PANEL
ALT: 16 IU/L (ref 0–44)
AST: 18 IU/L (ref 0–40)
Albumin: 4.5 g/dL (ref 3.8–4.8)
Alkaline Phosphatase: 56 IU/L (ref 39–117)
Bilirubin Total: 0.4 mg/dL (ref 0.0–1.2)
Bilirubin, Direct: 0.15 mg/dL (ref 0.00–0.40)
Total Protein: 6.6 g/dL (ref 6.0–8.5)

## 2018-08-04 LAB — LIPID PANEL
Chol/HDL Ratio: 2.4 ratio (ref 0.0–5.0)
Cholesterol, Total: 161 mg/dL (ref 100–199)
HDL: 66 mg/dL (ref >39)
LDL Calculated: 74 mg/dL (ref 0–99)
Triglycerides: 105 mg/dL (ref 0–149)
VLDL Cholesterol Cal: 21 mg/dL (ref 5–40)

## 2018-08-04 LAB — BASIC METABOLIC PANEL
BUN/Creatinine Ratio: 16 (ref 10–24)
BUN: 15 mg/dL (ref 8–27)
CO2: 24 mmol/L (ref 20–29)
Calcium: 9.1 mg/dL (ref 8.6–10.2)
Chloride: 102 mmol/L (ref 96–106)
Creatinine, Ser: 0.95 mg/dL (ref 0.76–1.27)
GFR calc Af Amer: 94 mL/min/{1.73_m2} (ref >59)
GFR calc non Af Amer: 81 mL/min/{1.73_m2} (ref >59)
Glucose: 94 mg/dL (ref 65–99)
Potassium: 4.9 mmol/L (ref 3.5–5.2)
Sodium: 143 mmol/L (ref 134–144)

## 2018-08-04 NOTE — Patient Instructions (Signed)
Medication Instructions:  Continue current medications  If you need a refill on your cardiac medications before your next appointment, please call your pharmacy.  Labwork: Fasting Lipid Liver, BMP HERE IN OUR OFFICE AT [facility]  You will need to fast. DO NOT EAT OR DRINK PAST MIDNIGHT.      Take the provided lab slips with you to the lab for your blood draw.   When you have your labs (blood work) drawn today and your tests are completely normal, you will receive your results only by MyChart Message (if you have MyChart) -OR-  A paper copy in the mail.  If you have any lab test that is abnormal or we need to change your treatment, we will call you to review these results.  Testing/Procedures: Your physician has requested that you have an echocardiogram. Echocardiography is a painless test that uses sound waves to create images of your heart. It provides your doctor with information about the size and shape of your heart and how well your heart's chambers and valves are working. This procedure takes approximately one hour. There are no restrictions for this procedure.  Follow-Up: You will need a follow up appointment in 6 months.  Please call our office 2 months in advance to schedule this appointment.  You may see Dr Percival Spanish or one of the following Advanced Practice Providers on your designated Care Team:   Rosaria Ferries, PA-C . Jory Sims, DNP, ANP     At [facility], you and your health needs are our priority.  As part of our continuing mission to provide you with exceptional heart care, we have created designated Provider Care Teams.  These Care Teams include your primary Cardiologist (physician) and Advanced Practice Providers (APPs -  Physician Assistants and Nurse Practitioners) who all work together to provide you with the care you need, when you need it.  Thank you for choosing [facility]!!

## 2018-08-08 ENCOUNTER — Other Ambulatory Visit (INDEPENDENT_AMBULATORY_CARE_PROVIDER_SITE_OTHER): Payer: Medicare HMO

## 2018-08-08 DIAGNOSIS — I350 Nonrheumatic aortic (valve) stenosis: Secondary | ICD-10-CM | POA: Diagnosis not present

## 2018-08-08 DIAGNOSIS — M5442 Lumbago with sciatica, left side: Secondary | ICD-10-CM | POA: Diagnosis not present

## 2018-08-08 DIAGNOSIS — I10 Essential (primary) hypertension: Secondary | ICD-10-CM

## 2018-08-08 DIAGNOSIS — R011 Cardiac murmur, unspecified: Secondary | ICD-10-CM

## 2018-08-08 DIAGNOSIS — I351 Nonrheumatic aortic (valve) insufficiency: Secondary | ICD-10-CM

## 2018-08-14 ENCOUNTER — Ambulatory Visit (INDEPENDENT_AMBULATORY_CARE_PROVIDER_SITE_OTHER): Payer: Medicare HMO | Admitting: Bariatrics

## 2018-08-14 ENCOUNTER — Encounter (INDEPENDENT_AMBULATORY_CARE_PROVIDER_SITE_OTHER): Payer: Self-pay | Admitting: Bariatrics

## 2018-08-14 ENCOUNTER — Other Ambulatory Visit: Payer: Self-pay

## 2018-08-14 VITALS — BP 142/99 | HR 53 | Temp 98.3°F | Ht 67.0 in | Wt 267.0 lb

## 2018-08-14 DIAGNOSIS — R0602 Shortness of breath: Secondary | ICD-10-CM

## 2018-08-14 DIAGNOSIS — Z0289 Encounter for other administrative examinations: Secondary | ICD-10-CM

## 2018-08-14 DIAGNOSIS — F3289 Other specified depressive episodes: Secondary | ICD-10-CM | POA: Diagnosis not present

## 2018-08-14 DIAGNOSIS — R69 Illness, unspecified: Secondary | ICD-10-CM | POA: Diagnosis not present

## 2018-08-14 DIAGNOSIS — E66813 Obesity, class 3: Secondary | ICD-10-CM

## 2018-08-14 DIAGNOSIS — Z6841 Body Mass Index (BMI) 40.0 and over, adult: Secondary | ICD-10-CM

## 2018-08-14 DIAGNOSIS — E119 Type 2 diabetes mellitus without complications: Secondary | ICD-10-CM | POA: Diagnosis not present

## 2018-08-14 DIAGNOSIS — I10 Essential (primary) hypertension: Secondary | ICD-10-CM | POA: Diagnosis not present

## 2018-08-14 DIAGNOSIS — E1169 Type 2 diabetes mellitus with other specified complication: Secondary | ICD-10-CM | POA: Diagnosis not present

## 2018-08-14 DIAGNOSIS — R5383 Other fatigue: Secondary | ICD-10-CM | POA: Diagnosis not present

## 2018-08-14 DIAGNOSIS — R799 Abnormal finding of blood chemistry, unspecified: Secondary | ICD-10-CM | POA: Diagnosis not present

## 2018-08-14 DIAGNOSIS — I890 Lymphedema, not elsewhere classified: Secondary | ICD-10-CM | POA: Diagnosis not present

## 2018-08-15 LAB — HEMOGLOBIN A1C
Est. average glucose Bld gHb Est-mCnc: 114 mg/dL
Hgb A1c MFr Bld: 5.6 % (ref 4.8–5.6)

## 2018-08-15 LAB — VITAMIN D 25 HYDROXY (VIT D DEFICIENCY, FRACTURES): Vit D, 25-Hydroxy: 36.3 ng/mL (ref 30.0–100.0)

## 2018-08-15 LAB — INSULIN, RANDOM: INSULIN: 11.4 u[IU]/mL (ref 2.6–24.9)

## 2018-08-15 LAB — TSH: TSH: 2.56 u[IU]/mL (ref 0.450–4.500)

## 2018-08-15 LAB — T3: T3, Total: 88 ng/dL (ref 71–180)

## 2018-08-15 LAB — T4, FREE: Free T4: 0.8 ng/dL — ABNORMAL LOW (ref 0.82–1.77)

## 2018-08-18 ENCOUNTER — Other Ambulatory Visit: Payer: Self-pay

## 2018-08-18 ENCOUNTER — Ambulatory Visit (HOSPITAL_COMMUNITY): Payer: Medicare HMO | Attending: Cardiology

## 2018-08-18 DIAGNOSIS — I350 Nonrheumatic aortic (valve) stenosis: Secondary | ICD-10-CM | POA: Insufficient documentation

## 2018-08-19 ENCOUNTER — Telehealth: Payer: Self-pay | Admitting: Cardiology

## 2018-08-19 NOTE — Telephone Encounter (Signed)
New Message   Patient returning call about his echo results. Please give patient a call back.

## 2018-08-19 NOTE — Progress Notes (Signed)
Office: [phone]  /  Fax: [phone]   Dear Dr. Diona Browner,   Thank you for referring Gregory Gregory [name] to our clinic. The following note includes my evaluation and treatment recommendations.  HPI:   Chief Complaint: OBESITY    Gregory Gregory [name] has been referred by Gregory Sanders, MD for consultation regarding his obesity and obesity related comorbidities.    Gregory Gregory [name] (MR# [id number]) is a 68 y.o. male who presents on 08/14/2018 for obesity evaluation and treatment. Current BMI is Body mass index is 41.82 kg/m.Gregory Gregory [name] has been struggling with his weight for many years and has been unsuccessful in either losing weight, maintaining weight loss, or reaching his healthy weight goal. His goal is to be under 200 pounds.     Gregory Gregory [name] attended our information session and states he is currently in the action stage of change and ready to dedicate time achieving and maintaining a healthier weight. Gregory Gregory [name] is interested in becoming our patient and working on intensive lifestyle modifications including (but not limited to) diet, exercise and weight loss. Gregory Gregory [name] states that he needs a knee replacement    Gregory Gregory [name] states his family eats meals together he thinks his family will eat healthier with him  his wife does the cooking and he likes sweets his desired weight loss is 92 lbs. his heaviest weight ever was 292 lbs. he has significant food cravings issues  he skips meals frequently he is frequently drinking liquids with calories he frequently makes poor food choices he sometimes eats larger portions than normal  he has binge eating behaviors he struggles with emotional eating    Fatigue Gregory Gregory [name] feels his energy is lower than it should be. This has worsened with weight gain and has not worsened recently. Gregory Gregory [name] admits to daytime somnolence and he admits to waking up still tired. Patient is at risk for obstructive sleep apnea. Gregory [name] has a history of symptoms of daytime fatigue, morning  fatigue and hypertension. Patient generally gets 5 hours of sleep per night, and states they generally have restless sleep. Snoring is present. Apneic episodes are present. Epworth Sleepiness Score is 21  Dyspnea on exertion Gregory Gregory [name] notes increasing shortness of breath with certain activities and seems to be worsening over time with weight gain. He notes getting out of breath sooner with activity than he used to. This has not gotten worse recently. Gregory Gregory [name] admits to orthopnea.  Hypertension Gregory Gregory [name] is a 68 y.o. male with hypertension. He has a history of aortic insufficiency, Grade I diastolic dysfunction and heart murmur). His blood pressure is elevated today at 142/99. He is taking Norvasc and Zestril. Gregory Gregory [name] denies chest pain. He is working weight loss to help control his blood pressure with the goal of decreasing his risk of heart attack and stroke. Gregory Gregory [name] blood pressure is not currently controlled.  Diabetes II in obesity Gregory [name] has a diagnosis of diabetes type II. He is not on medications. Last A1c was at 5.8 (prediabetic stage). He is attempting to work on intensive lifestyle modifications including diet, exercise, and weight loss to help control his blood glucose levels.  Lymphedema Gregory Gregory [name] has a diagnosis of lymphedema and he wears support Gregory [name]  Depression with emotional eating behaviors Gregory Gregory [name] is struggling with emotional eating and he is doing some stress eating. He is using food for comfort to the extent that it is negatively impacting his health. He often snacks when he is not hungry. Gregory Gregory [name] sometimes feels he is out of control and then feels  guilty that he made poor food choices. He has been working on behavior modification techniques to help reduce his emotional eating. He shows no sign of suicidal or homicidal ideations.  Depression Screen Gregory Gregory [name] Food and Mood (modified PHQ-9) score was  Depression screen PHQ 2/9 08/14/2018  Decreased Interest 3  Down,  Depressed, Hopeless 1  PHQ - 2 Score 4  Altered sleeping 3  Tired, decreased energy 3  Change in appetite 1  Trouble concentrating 1  Moving slowly or fidgety/restless 3  Suicidal thoughts 0  PHQ-9 Score 15  Difficult doing work/chores Somewhat difficult    ASSESSMENT AND PLAN:  Other fatigue - Plan: T3, TSH, T4, free, VITAMIN D 25 Hydroxy (Vit-D Deficiency, Fractures)  Shortness of breath on exertion  Essential hypertension  Type 2 diabetes mellitus without complication, without long-term current use of insulin (HCC) - Plan: Hemoglobin A1c, Insulin, random  Other depression - with emotional eating  Lymphedema  Class 3 severe obesity with serious comorbidity and body mass index (BMI) of 40.0 to 44.9 in adult, unspecified obesity type (HCC)  PLAN:  Fatigue Gregory Gregory [name] was informed that his fatigue may be related to obesity, depression or many other causes. Labs will be ordered, and in the meanwhile Gregory [name] has agreed to work on diet, exercise and weight loss to help with fatigue. Proper sleep hygiene was discussed including the need for 7-8 hours of quality sleep each night. A sleep study was not ordered based on symptoms and Epworth score.  Dyspnea on exertion Gregory [name]'s shortness of breath appears to be obesity related and exercise induced. He has agreed to work on weight loss and gradually increase exercise to treat his exercise induced shortness of breath. If Gregory [name] follows our instructions and loses weight without improvement of his shortness of breath, we will plan to refer to pulmonology. We will monitor this condition regularly. Gregory Gregory [name] agrees to this plan.  Hypertension We discussed sodium restriction, working on healthy weight loss, and a regular exercise program as the means to achieve improved blood pressure control. Gregory Gregory [name] agreed with this plan and agreed to follow up as directed. We will continue to monitor his blood pressure as well as his progress with the above lifestyle  modifications. He will continue his medications as prescribed and will watch for signs of hypotension as he continues his lifestyle modifications.  Diabetes II in obesity Gregory Gregory [name] has been given extensive diabetes education by myself today including ideal fasting and post-prandial blood glucose readings, individual ideal Hgb A1c goals and hypoglycemia prevention. We discussed the importance of good blood sugar control to decrease the likelihood of diabetic complications such as nephropathy, neuropathy, limb loss, blindness, coronary artery disease, and death. We discussed the importance of intensive lifestyle modification including diet, exercise and weight loss as the first line treatment for diabetes. Ebbie will work on increasing lean protein and decreasing simple carbohydrates in his diet. He agrees to follow up at the agreed upon time.  Lymphedema Gregory Gregory [name] will increase elevating his feet and he will continue to wear support Gregory [name] Gregory Gregory [name] agrees to follow up with our clinic in 2 weeks.  Depression with Emotional Eating Behaviors We discussed behavior modification techniques today to help Gregory Gregory [name] deal with his emotional eating and depression. We will refer patient to Dr. Mallie Mussel our bariatric psychologist.  Depression Screen Gregory Gregory [name] had a strongly positive depression screening. Depression is commonly associated with obesity and often results in emotional eating behaviors. We will monitor this closely and work on CBT to help improve the non-hunger  eating patterns. Referral to Psychology may be required if no improvement is seen as he continues in our clinic.  Obesity Shelia is currently in the action stage of change and his goal is to continue with weight loss efforts. I recommend Gregory Gregory [name] begin the structured treatment plan as follows:  He has agreed to follow the Category 4 plan  Gregory Gregory [name] has been instructed to eventually work up to a goal of 150 minutes of combined cardio and strengthening exercise per  week for weight loss and overall health benefits. We discussed the following Behavioral Modification Strategies today: planning for success, increase H2O intake, no skipping meals, keeping healthy foods in the home, increasing lean protein intake, decreasing simple carbohydrates, increasing vegetables, decrease eating out and work on meal planning and intentional eating   He was informed of the importance of frequent follow up visits to maximize his success with intensive lifestyle modifications for his multiple health conditions. He was informed we would discuss his lab results at his next visit unless there is a critical issue that needs to be addressed sooner. Juvon agreed to keep his next visit at the agreed upon time to discuss these results.  ALLERGIES: No Known Allergies  MEDICATIONS: Current Outpatient Medications on File Prior to Visit  Medication Sig Dispense Refill  . ALPRAZolam (XANAX) 0.5 MG tablet Take 1 tablet (0.5 mg total) by mouth daily as needed for anxiety (30 min prior to plane flight or prn anxiety). 20 tablet 0  . amLODipine (NORVASC) 10 MG tablet Take 1 tablet by mouth once daily 90 tablet 1  . aspirin 81 MG chewable tablet Chew by mouth daily.    [name] atorvastatin (LIPITOR) 40 MG tablet Take 1 tablet by mouth once daily 90 tablet 2  . furosemide (LASIX) 40 MG tablet Take 1 tablet (40 mg total) by mouth daily. 90 tablet 3  . ibuprofen (ADVIL,MOTRIN) 200 MG tablet Take 800 mg by mouth 2 (two) times daily.    [name] lisinopril (ZESTRIL) 40 MG tablet Take 1 tablet by mouth once daily 90 tablet 1  . Misc Natural Products (OSTEO BI-FLEX/5-LOXIN ADVANCED PO) Take by mouth.    . potassium chloride SA (K-DUR,KLOR-CON) 20 MEQ tablet Take 1 tablet (20 mEq total) by mouth daily. 90 tablet 3  . Ferrous Sulfate (IRON) 325 (65 Fe) MG TABS Take by mouth.     No current facility-administered medications on file prior to visit.     PAST MEDICAL HISTORY: Past Medical History:  Diagnosis  Date  . Alcohol abuse   . Anxiety with flying   . Aortic insufficiency   . Drug use   . Edema of both lower extremities   . Glaucoma   . Heart murmur   . Hypertension   . Joint pain   . OSA (obstructive sleep apnea)   . Shingles   . SOB (shortness of breath)     PAST SURGICAL HISTORY: Past Surgical History:  Procedure Laterality Date  . CATARACT EXTRACTION    . None      SOCIAL HISTORY: Social History   Tobacco Use  . Smoking status: Never Smoker  . Smokeless tobacco: Never Used  Substance Use Topics  . Alcohol use: Yes    Alcohol/week: 5.0 standard drinks    Types: 5 Cans of beer per week  . Drug use: No    FAMILY HISTORY: Family History  Adopted: Yes  Problem Relation Age of Onset  . Cancer Mother        breast  cancer  . Alcohol abuse Father   . Breast cancer Sister     ROS: Review of Systems  Constitutional: Positive for malaise/fatigue.  HENT: Positive for hearing loss.   Eyes:       + Wear Glasses or Contacts Positive for Floaters  Respiratory: Positive for shortness of breath.   Cardiovascular: Positive for orthopnea. Negative for chest pain.       Positive for Shortness of Breath with Activity Positive for Sudden Awakening from Sleep with Shortness of Breath Positive for Calf/Leg Pain with Walking  Gastrointestinal: Positive for constipation and heartburn.  Genitourinary: Positive for frequency.  Musculoskeletal: Positive for back pain.       Positive for Muscle or Joint Pain Positive for Swollen Joints  Skin:       Positive for Dryness  Neurological: Positive for weakness.  Endo/Heme/Allergies: Bruises/bleeds easily.  Psychiatric/Behavioral: Positive for depression. Negative for suicidal ideas. The patient has insomnia.     PHYSICAL EXAM: Blood pressure (!) 142/99, pulse (!) 53, temperature 98.3 F (36.8 C), temperature source Oral, height 5\' 7"  (1.702 m), weight 267 lb (121.1 kg), SpO2 93 %. Body mass index is 41.82 kg/m. Physical  Exam Vitals signs reviewed.  Constitutional:      Appearance: He is well-developed. He is obese.  HENT:     Head: Normocephalic and atraumatic.     Nose: Nose normal.  Eyes:     General: No scleral icterus.    Extraocular Movements: Extraocular movements intact.  Neck:     Musculoskeletal: Normal range of motion and neck supple.     Thyroid: No thyromegaly.  Cardiovascular:     Rate and Rhythm: Normal rate and regular rhythm.  Pulmonary:     Effort: Pulmonary effort is normal. No respiratory distress.  Abdominal:     Palpations: Abdomen is soft.     Tenderness: There is no abdominal tenderness.  Musculoskeletal: Normal range of motion.     Comments: Range of Motion normal in all 4 extremities  Skin:    General: Skin is warm and dry.  Neurological:     Mental Status: He is alert and oriented to person, place, and time.     Comments: + walking with a cane  Psychiatric:        Behavior: Behavior normal.        Thought Content: Thought content does not include homicidal or suicidal ideation.     RECENT LABS AND TESTS: BMET    Component Value Date/Time   NA 143 08/04/2018 1052   K 4.9 08/04/2018 1052   CL 102 08/04/2018 1052   CO2 24 08/04/2018 1052   GLUCOSE 94 08/04/2018 1052   GLUCOSE 99 01/14/2018 0752   GLUCOSE 92 10/25/2005 1507   BUN 15 08/04/2018 1052   CREATININE 0.95 08/04/2018 1052   CALCIUM 9.1 08/04/2018 1052   GFRNONAA 81 08/04/2018 1052   GFRAA 94 08/04/2018 1052   Lab Results  Component Value Date   HGBA1C 5.6 08/14/2018   Lab Results  Component Value Date   INSULIN 11.4 08/14/2018   CBC    Component Value Date/Time   WBC 6.8 12/12/2016 1129   RBC 4.12 (L) 12/12/2016 1129   HGB 13.3 12/12/2016 1129   HCT 40.1 12/12/2016 1129   PLT 204.0 12/12/2016 1129   MCV 97.5 12/12/2016 1129   MCHC 33.2 12/12/2016 1129   RDW 13.8 12/12/2016 1129   LYMPHSABS 1.8 12/12/2016 1129   MONOABS 0.8 12/12/2016 1129   EOSABS 0.1  12/12/2016 1129   BASOSABS  0.1 12/12/2016 1129   Iron/TIBC/Ferritin/ %Sat No results found for: IRON, TIBC, FERRITIN, IRONPCTSAT Lipid Panel     Component Value Date/Time   CHOL 161 08/04/2018 1052   TRIG 105 08/04/2018 1052   TRIG 162 (H) 10/25/2005 1507   HDL 66 08/04/2018 1052   CHOLHDL 2.4 08/04/2018 1052   CHOLHDL 3 01/14/2018 0752   VLDL 25.8 01/14/2018 0752   LDLCALC 74 08/04/2018 1052   LDLDIRECT 151.0 01/05/2016 0748   Hepatic Function Panel     Component Value Date/Time   PROT 6.6 08/04/2018 1052   ALBUMIN 4.5 08/04/2018 1052   AST 18 08/04/2018 1052   ALT 16 08/04/2018 1052   ALKPHOS 56 08/04/2018 1052   BILITOT 0.4 08/04/2018 1052   BILIDIR 0.15 08/04/2018 1052      Component Value Date/Time   TSH 2.560 08/14/2018 1209   TSH 4.14 01/11/2015 1111   TSH 2.99 05/18/2013 0847    INDIRECT CALORIMETER done today shows a VO2 of 349 and a REE of 2428.  His calculated basal metabolic rate is 1505 thus his basal metabolic rate is better than expected.       OBESITY BEHAVIORAL INTERVENTION VISIT  Today's visit was # 1   Starting weight: 267 lbs Starting date: 08/14/2018 Today's weight : 267 lbs Today's date: 08/14/2018 Total lbs lost to date: 0    08/14/2018  Height 5\' 7"  (1.702 m)  Weight 267 lb (121.1 kg)  BMI (Calculated) 41.81  BLOOD PRESSURE - SYSTOLIC 697  BLOOD PRESSURE - DIASTOLIC 99   Body Fat % 94.8 %    ASK: We discussed the diagnosis of obesity with Gregory Gregory [name] today and Gregory Gregory [name] agreed to give [place] permission to discuss obesity behavioral modification therapy today.  ASSESS: Riyad has the diagnosis of obesity and his BMI today is 41.81 Jamail is in the action stage of change   ADVISE: Herberto was educated on the multiple health risks of obesity as well as the benefit of weight loss to improve his health. He was advised of the need for long term treatment and the importance of lifestyle modifications to improve his current health and to decrease his risk of  future health problems.  AGREE: Multiple dietary modification options and treatment options were discussed and  Tahjir agreed to follow the recommendations documented in the above note.  ARRANGE: Ladell was educated on the importance of frequent visits to treat obesity as outlined per CMS and USPSTF guidelines and agreed to schedule his next follow up appointment today.  Corey Skains, am acting as Location manager for [employer]. Owens Shark, DO  I have reviewed the above documentation for accuracy and completeness, and I agree with the above. -Jearld Lesch, DO

## 2018-08-20 ENCOUNTER — Encounter (INDEPENDENT_AMBULATORY_CARE_PROVIDER_SITE_OTHER): Payer: Self-pay | Admitting: Bariatrics

## 2018-08-20 NOTE — Progress Notes (Signed)
Office: [phone]  /  Fax: [phone]    Date: August 26, 2018   Appointment Start Time: 9:09am Duration: 31 minutes Provider: Lawerance Cruel, Psy.D. Type of Session: Intake for Individual Therapy  Location of Patient: Home Location of Provider: [facility] Type of Contact: Telepsychological Visit via [employer] Web[employer]  Informed Consent: This provider called Ramon Dredge at 9:01am as he did not present for the [facility] appointment. He reported he was working with his wife to join the [employer] appointment. Assistance was provided. As such, today's appointment was initiated 9 minutes late. Prior to proceeding with today's appointment, two pieces of identifying information were obtained from Ramon Dredge to verify identity. In addition, Rebel's physical location at the time of this appointment was obtained. Barry reported he was at home and provided the address. In the event of technical difficulties, Hershall shared a phone number he could be reached at. Ramon Dredge and this provider participated in today's telepsychological service. Also, Pepper shared his wife, Braeton Chaires, was present. He requested she be present for the duration of the appointment. As such, limits of confidentiality were addressed. Lavert acknowledged understanding and consented to proceed with his wife being present.   The provider's role was explained to [employer]. The provider reviewed and discussed issues of confidentiality, privacy, and limits therein (e.g., reporting obligations). In addition to verbal informed consent, written informed consent for psychological services was obtained from [facility] prior to the initial intake interview. Written consent included information concerning the practice, financial arrangements, and confidentiality and patients' rights. Since the clinic is not a 24/7 crisis center, mental health emergency resources were shared, and the provider explained MyChart, e-mail, voicemail, and/or other  messaging systems should be utilized only for non-emergency reasons. This provider also explained that information obtained during appointments will be placed in Dracen's medical record in a confidential manner and relevant information will be shared with other providers at [facility] that he meets with for coordination of care. Trisden verbally acknowledged understanding of the aforementioned, and agreed to use mental health emergency resources discussed if needed. Moreover, Trenity agreed information may be shared with other [facility] providers as needed for coordination of care. By signing the service agreement document, Rhody provided written consent for coordination of care.   Prior to initiating telepsychological services, Waldron was provided with an informed consent document, which included the development of a safety plan (i.e., an emergency contact and emergency resources) in the event of an emergency/crisis. Josian expressed understanding of the rationale of the safety plan and provided consent for this provider to reach out to his emergency contact in the event of an emergency/crisis. Jamille returned the completed consent form prior to today's appointment. This provider verbally reviewed the consent form during today's appointment prior to proceeding with the appointment. Jastin verbally acknowledged understanding that he is ultimately responsible for understanding his insurance benefits as it relates to reimbursement of telepsychological and in-person services. This provider also reviewed confidentiality, as it relates to telepsychological services, as well as the rationale for telepsychological services. More specifically, this provider's clinic is limiting in-person visits due to COVID-19. Therapeutic services will resume to in-person appointments once deemed appropriate. Tylyn expressed understanding regarding the rationale for telepsychological services. In addition,  this provider explained the telepsychological services informed consent document would be considered an addendum to the initial consent document/service agreement. Angie verbally consented to proceed.   Chief Complaint/HPI: Absalon was referred by Dr. Corinna Capra due to depression  with emotional eating behaviors. Per the note for the visit with Dr. Corinna Capra on August 14, 2018, "Macklyn is struggling with emotional eating and he is doing some stress eating. He is using food for comfort to the extent that it is negatively impacting his health. He often snacks when he is not hungry. Djay sometimes feels he is out of control and then feels guilty that he made poor food choices. He has been working on behavior modification techniques to help reduce his emotional eating. He shows no sign of suicidal or homicidal ideations." Pedro reported experiencing the following: significant food cravings issues , frequently drinking liquids with calories, frequently making poor food choices, frequently eating larger portions than normal , binge eating behaviors, struggling with emotional eating and skipping meals frequently.   During today's appointment, Javyn was verbally administered a questionnaire assessing various behaviors related to emotional eating. Hristopher endorsed the following: overeat when you are celebrating, experience food cravings on a regular basis, eat certain foods when you are anxious, stressed, depressed, or your feelings are hurt, use food to help you cope with emotional situations, overeat when you are angry or upset, overeat frequently when you are bored or lonely, not worry about what you eat when you are in a good mood, overeat when you are angry at someone just to show them they cannot control you and overeat when you are alone, but eat much less when you are with other people. He shared he craves salty snacks, pretzels and chips. He added, "We've pretty much eliminated 90% of that." Regarding the  onset of emotional eating, Ward stated, "I wouldn't know." He described the current frequency as "very little because we are on this little diet thing we are doing." In addition, Chawn denied a history of binge eating. He described engaging in grazing behaviors on the weekends. Johathon denied a history of restricting food intake, purging and engagement in other compensatory strategies, and has never been diagnosed with an eating disorder. He also denied a history of treatment for emotional eating.  Moreover, Eaven indicated boredom triggers emotional eating, whereas "the wife cracking the whip" makes emotional eating better. His wife added Daishawn was previously more physically activity, but is no longer able to due to medical conditions. She further shared he continues to eat the way he used to when he was "active." Furthermore, Lion shared he needs to knee surgery and noted he currently also has a heart murmur that will need to be addressed.   Mental Status Examination:  Appearance: neat Behavior: cooperative Mood: euthymic Affect: mood congruent Speech: normal in rate, volume, and tone Eye Contact: appropriate Psychomotor Activity: appropriate Thought Process: linear, logical, and goal directed  Content/Perceptual Disturbances: denies suicidal and homicidal ideation, plan, and intent and no hallucinations, delusions, bizarre thinking or behavior reported or observed Orientation: time, person, place and purpose of appointment Cognition/Sensorium: memory, attention, language, and fund of knowledge intact  Insight: fair Judgment: fair  Family & Psychosocial History: Loran reported he is married and he has one adult biological daughter and two adult adopted sons. He indicated he is currently employed full time in [employer]. Additionally, Damarea shared his highest level of education obtained is "about third year of college." Currently, Kasem's social support system consists of his  wife, co-workers, and friends. Moreover, Adrianne stated he resides with his wife, 3 cats, and 1 dog.   Medical History:  Past Medical History:  Diagnosis Date  . Alcohol abuse   . Anxiety  with flying   . Aortic insufficiency   . Drug use   . Edema of both lower extremities   . Glaucoma   . Heart murmur   . Hypertension   . Joint pain   . OSA (obstructive sleep apnea)   . Shingles   . SOB (shortness of breath)    Past Surgical History:  Procedure Laterality Date  . CATARACT EXTRACTION    . None     Current Outpatient Medications on File Prior to Visit  Medication Sig Dispense Refill  . ALPRAZolam (XANAX) 0.5 MG tablet Take 1 tablet (0.5 mg total) by mouth daily as needed for anxiety (30 min prior to plane flight or prn anxiety). 20 tablet 0  . amLODipine (NORVASC) 10 MG tablet Take 1 tablet by mouth once daily 90 tablet 1  . aspirin 81 MG chewable tablet Chew by mouth daily.    [name] atorvastatin (LIPITOR) 40 MG tablet Take 1 tablet by mouth once daily 90 tablet 2  . Ferrous Sulfate (IRON) 325 (65 Fe) MG TABS Take by mouth.    . furosemide (LASIX) 40 MG tablet Take 1 tablet (40 mg total) by mouth daily. 90 tablet 3  . ibuprofen (ADVIL,MOTRIN) 200 MG tablet Take 800 mg by mouth 2 (two) times daily.    [name] lisinopril (ZESTRIL) 40 MG tablet Take 1 tablet by mouth once daily 90 tablet 1  . Misc Natural Products (OSTEO BI-FLEX/5-LOXIN ADVANCED PO) Take by mouth.    . potassium chloride SA (K-DUR,KLOR-CON) 20 MEQ tablet Take 1 tablet (20 mEq total) by mouth daily. 90 tablet 3   No current facility-administered medications on file prior to visit.   Zamar denied a history of head injuries and loss of consciousness.    Mental Health History: Taras denied a history of therapeutic services. Samantha denied a history of hospitalizations for psychiatric concerns. He indicated he met with a psychiatrist "during the [place] war era." Maks recalled he met with a psychiatrist "to have an excuse" to  not be enlisted. Currently, Parsa is prescribed Xanax by her PCP to assist with flying as he experiences claustrophobia. Demetrik denied a family history of mental health related concerns. He noted, "I don't have a whole lot of history. Parents died when I was very young." Tegen denied a trauma history, including psychological, physical  and sexual abuse, as well as neglect.   Gar described his typical mood as "slack." He explained, "I try not to think too hard about anything in general." Aside from concerns noted above and endorsed on the PHQ-9 and GAD-7, Brodrick reported experiencing decreased motivation due to his physical concerns and decreased self-esteem due to weight. He reported a history of panic attacks and noted the last one was a year ago. Given endorsed current alcohol use. More specifically, Kross shared he used to consume "3 or 4 beers a night 7 days a week." Since starting with the clinic, Laurin stated they have "gotten rid of most of the beer." Currently, he consumes 2 White Claws daily. He denied current concerns about his alcohol use. He denied tobacco use. He endorsed illicit/recreational substance use. More specifically, Kaynin shared marijuana used "maybe once a week, but not a weekly basis." His marijuana use is in the form of vaping and edibles. Klay is unsure of the quantity. Madyx denied operating a motor vehicle or machinery while under the influence. He noted his marijuana and alcohol use are at home. Tray further shared his providers are aware of his  marijuana use. Regarding caffeine intake, Jamesdavid reported consuming one cup of coffee daily. Furthermore, Ramon Dredge denied experiencing the following: hopelessness, memory concerns, hallucinations and delusions, paranoia and symptoms of mania (e.g., expansive mood, flighty ideas, decreased need for sleep, engagement in risky behaviors). He also denied history of and current suicidal ideation, plan, and intent; history of and current  homicidal ideation, plan, and intent; and history of and current engagement in self-harm.  The following strengths were reported by Ramon Dredge: sense of humor and very dedicated at work. The following strengths were observed by this provider: ability to express thoughts and feelings during the therapeutic session, ability to establish and benefit from a therapeutic relationship, ability to learn and practice coping skills, willingness to work toward established goal(s) with the clinic and ability to engage in reciprocal conversation.  Legal History: Delshaun reported a history of "several DWIs" during the 39s and 57s. His last DWI was in the mid- 90s.   Structured Assessment Results: The Patient Health Questionnaire-9 (PHQ-9) is a self-report measure that assesses symptoms and severity of depression over the course of the last two weeks. Cahlil obtained a score of 5 suggesting mild depression. Nix finds the endorsed symptoms to be not difficult at all. Little interest or pleasure in doing things 0  Feeling down, depressed, or hopeless 0  Trouble falling or staying asleep, or sleeping too much 3  Feeling tired or having little energy 1  Poor appetite or overeating 0  Feeling bad about yourself --- or that you are a failure or have let yourself or your family down 1  Trouble concentrating on things, such as reading the newspaper or watching television 0  Moving or speaking so slowly that other people could have noticed? Or the opposite --- being so fidgety or restless that you have been moving around a lot more than usual 0  Thoughts that you would be better off dead or hurting yourself in some way 0  PHQ-9 Score 5    The Generalized Anxiety Disorder-7 (GAD-7) is a brief self-report measure that assesses symptoms of anxiety over the course of the last two weeks. Jocob obtained a score of 1 suggesting minimal anxiety. Gamaliel finds the endorsed symptoms to be not difficult at all. Feeling nervous,  anxious, on edge 0  Not being able to stop or control worrying 0  Worrying too much about different things 0  Trouble relaxing 0  Being so restless that it's hard to sit still 0  Becoming easily annoyed or irritable 1  Feeling afraid as if something awful might happen 0  GAD-7 Score 1   Interventions: A chart review was conducted prior to the clinical intake interview. The PHQ-9, and GAD-7 were verbally administered as well as a Mood and Food questionnaire to assess various behaviors related to emotional eating. Throughout session, empathic reflections and validation was provided. Psychoeducation regarding emotional versus physical hunger was provided. Cope was sent a handout via e-mail to increase awareness of hunger patterns and subsequent eating. Ramon Dredge provided verbal consent during today's appointment for this provider to send the handout via e-mail.   Provisional DSM-5 Diagnosis: 311 (F32.8) Other Specified Depressive Disorder, Emotional Eating Behaviors  Plan: Enoch declined future appointments with this provider. Kailand noted, "I know what I need to do" and shared he has support. He acknowledged understanding that he may request a follow-up appointment with this provider in the future as long as he is still established with the clinic. No further follow-up planned by this  provider.

## 2018-08-21 NOTE — Telephone Encounter (Signed)
Pt aware of his Echo

## 2018-08-26 ENCOUNTER — Ambulatory Visit (INDEPENDENT_AMBULATORY_CARE_PROVIDER_SITE_OTHER): Payer: Medicare HMO | Admitting: Psychology

## 2018-08-26 ENCOUNTER — Other Ambulatory Visit: Payer: Self-pay

## 2018-08-26 DIAGNOSIS — R69 Illness, unspecified: Secondary | ICD-10-CM | POA: Diagnosis not present

## 2018-08-26 DIAGNOSIS — F3289 Other specified depressive episodes: Secondary | ICD-10-CM

## 2018-08-28 ENCOUNTER — Ambulatory Visit (INDEPENDENT_AMBULATORY_CARE_PROVIDER_SITE_OTHER): Payer: Medicare HMO | Admitting: Bariatrics

## 2018-09-01 ENCOUNTER — Ambulatory Visit (INDEPENDENT_AMBULATORY_CARE_PROVIDER_SITE_OTHER): Payer: Medicare HMO | Admitting: Bariatrics

## 2018-09-01 ENCOUNTER — Other Ambulatory Visit: Payer: Self-pay

## 2018-09-01 VITALS — BP 122/59 | HR 59 | Temp 98.3°F | Ht 67.0 in | Wt 259.0 lb

## 2018-09-01 DIAGNOSIS — E559 Vitamin D deficiency, unspecified: Secondary | ICD-10-CM

## 2018-09-01 DIAGNOSIS — E8881 Metabolic syndrome: Secondary | ICD-10-CM

## 2018-09-01 DIAGNOSIS — Z6841 Body Mass Index (BMI) 40.0 and over, adult: Secondary | ICD-10-CM | POA: Diagnosis not present

## 2018-09-01 MED ORDER — VITAMIN D (ERGOCALCIFEROL) 1.25 MG (50000 UNIT) PO CAPS: 50000.0000 [IU] | ORAL_CAPSULE | ORAL | 0 refills | Status: DC

## 2018-09-02 NOTE — Progress Notes (Signed)
Office: [phone]  /  Fax: [phone]   HPI:   Chief Complaint: OBESITY Gregory [name] is here to discuss his progress with his obesity treatment plan. He is on the Category 4 plan and is following his eating plan approximately 85 % of the time. He states he is exercising 0 minutes 0 times per week. [name] is down 8 pounds. He did well with breakfast and he struggles with grazing. [name] is not getting enough water. His weight is 259 lb (117.5 kg) today and he has had a weight loss of 8 pounds over a period of 2 weeks since his last visit. He has lost 8 lbs since starting treatment with [place].  Insulin Resistance (mild) [name] has a diagnosis of insulin resistance based on his elevated fasting insulin level >5. Although [name]'s blood glucose readings are still under good control, insulin resistance puts him at greater risk of metabolic syndrome and diabetes. His last A1c was at 5.6 and last insulin level was at 11.4 He is not taking metformin currently and continues to work on diet and exercise to decrease risk of diabetes. [name] denies polyphagia.  Vitamin D deficiency Triumph has a diagnosis of vitamin D deficiency. He is not currently taking vit D and his last vitamin D level was at 36.3. Aj denies nausea, vomiting or muscle weakness.  ASSESSMENT AND PLAN:  Insulin resistance  Vitamin D deficiency - Plan: Vitamin D, Ergocalciferol, (DRISDOL) 1.25 MG (50000 UT) CAPS capsule  Class 3 severe obesity with serious comorbidity and body mass index (BMI) of 40.0 to 44.9 in adult, unspecified obesity type (HCC)  PLAN:  Insulin Resistance (mild) [name] will continue to work on weight loss, exercise, increasing lean protein and decreasing simple carbohydrates in his diet to help decrease the risk of diabetes. He was informed that eating too many simple carbohydrates or too many calories at one sitting increases the likelihood of GI side effects. [name] agreed to follow up with [place] as directed to  monitor his progress.  Vitamin D Deficiency [name] was informed that low vitamin D levels contributes to fatigue and are associated with obesity, breast, and colon cancer. [name] agrees to start prescription Vit D @50 ,000 IU every week #4 with no refills and he will follow up for routine testing of vitamin D, at least 2-3 times per year. He was informed of the risk of over-replacement of vitamin D and agrees to not increase his dose unless he discusses this with [place] first. [name] agrees to follow up with our clinic in 2 weeks.  Obesity [name] is currently in the action stage of change. As such, his goal is to continue with weight loss efforts He has agreed to follow the Category 4 plan [name] has been instructed to work up to a goal of 150 minutes of combined cardio and strengthening exercise per week for weight loss and overall health benefits. We discussed the following Behavioral Modification Strategies today: increase H2O intake, no skipping meals, keeping healthy foods in the home, increasing lean protein intake, decreasing simple carbohydrates, increasing vegetables, decrease eating out and work on meal planning and intentional eating  [name] has agreed to follow up with our clinic in 2 weeks. He was informed of the importance of frequent follow up visits to maximize his success with intensive lifestyle modifications for his multiple health conditions.  ALLERGIES: No Known Allergies  MEDICATIONS: Current Outpatient Medications on File Prior to Visit  Medication Sig Dispense Refill  . ALPRAZolam (XANAX) 0.5 MG tablet Take 1  tablet (0.5 mg total) by mouth daily as needed for anxiety (30 min prior to plane flight or prn anxiety). 20 tablet 0  . amLODipine (NORVASC) 10 MG tablet Take 1 tablet by mouth once daily 90 tablet 1  . aspirin 81 MG chewable tablet Chew by mouth daily.    [name] atorvastatin (LIPITOR) 40 MG tablet Take 1 tablet by mouth once daily 90 tablet 2  . Ferrous Sulfate (IRON) 325  (65 Fe) MG TABS Take by mouth.    . furosemide (LASIX) 40 MG tablet Take 1 tablet (40 mg total) by mouth daily. 90 tablet 3  . ibuprofen (ADVIL,MOTRIN) 200 MG tablet Take 800 mg by mouth 2 (two) times daily.    [name] lisinopril (ZESTRIL) 40 MG tablet Take 1 tablet by mouth once daily 90 tablet 1  . Misc Natural Products (OSTEO BI-FLEX/5-LOXIN ADVANCED PO) Take by mouth.    . potassium chloride SA (K-DUR,KLOR-CON) 20 MEQ tablet Take 1 tablet (20 mEq total) by mouth daily. 90 tablet 3   No current facility-administered medications on file prior to visit.     PAST MEDICAL HISTORY: Past Medical History:  Diagnosis Date  . Alcohol abuse   . Anxiety with flying   . Aortic insufficiency   . Drug use   . Edema of both lower extremities   . Glaucoma   . Heart murmur   . Hypertension   . Joint pain   . OSA (obstructive sleep apnea)   . Shingles   . SOB (shortness of breath)     PAST SURGICAL HISTORY: Past Surgical History:  Procedure Laterality Date  . CATARACT EXTRACTION    . None      SOCIAL HISTORY: Social History   Tobacco Use  . Smoking status: Never Smoker  . Smokeless tobacco: Never Used  Substance Use Topics  . Alcohol use: Yes    Alcohol/week: 5.0 standard drinks    Types: 5 Cans of beer per week  . Drug use: No    FAMILY HISTORY: Family History  Adopted: Yes  Problem Relation Age of Onset  . Cancer Mother        breast cancer  . Alcohol abuse Father   . Breast cancer Sister     ROS: Review of Systems  Constitutional: Positive for weight loss.  Gastrointestinal: Negative for nausea and vomiting.  Musculoskeletal:       Negative for muscle weakness  Endo/Heme/Allergies:       Negative for polyphagia    PHYSICAL EXAM: Blood pressure (!) 122/59, pulse (!) 59, temperature 98.3 F (36.8 C), temperature source Oral, height 5\' 7"  (1.702 m), weight 259 lb (117.5 kg), SpO2 94 %. Body mass index is 40.57 kg/m. Physical Exam Vitals signs reviewed.   Constitutional:      Appearance: Normal appearance. He is well-developed. He is obese.  Cardiovascular:     Rate and Rhythm: Normal rate.  Pulmonary:     Effort: Pulmonary effort is normal.  Musculoskeletal: Normal range of motion.     Comments: + wearing support hose on lower legs  Skin:    General: Skin is warm and dry.  Neurological:     Mental Status: He is alert and oriented to person, place, and time.     Comments: + walking with a cane  Psychiatric:        Mood and Affect: Mood normal.        Behavior: Behavior normal.     RECENT LABS AND TESTS: BMET  Component Value Date/Time   NA 143 08/04/2018 1052   K 4.9 08/04/2018 1052   CL 102 08/04/2018 1052   CO2 24 08/04/2018 1052   GLUCOSE 94 08/04/2018 1052   GLUCOSE 99 01/14/2018 0752   GLUCOSE 92 10/25/2005 1507   BUN 15 08/04/2018 1052   CREATININE 0.95 08/04/2018 1052   CALCIUM 9.1 08/04/2018 1052   GFRNONAA 81 08/04/2018 1052   GFRAA 94 08/04/2018 1052   Lab Results  Component Value Date   HGBA1C 5.6 08/14/2018   HGBA1C 5.8 01/14/2018   HGBA1C 5.9 12/12/2016   HGBA1C 5.9 04/11/2016   HGBA1C 5.7 01/05/2016   Lab Results  Component Value Date   INSULIN 11.4 08/14/2018   CBC    Component Value Date/Time   WBC 6.8 12/12/2016 1129   RBC 4.12 (L) 12/12/2016 1129   HGB 13.3 12/12/2016 1129   HCT 40.1 12/12/2016 1129   PLT 204.0 12/12/2016 1129   MCV 97.5 12/12/2016 1129   MCHC 33.2 12/12/2016 1129   RDW 13.8 12/12/2016 1129   LYMPHSABS 1.8 12/12/2016 1129   MONOABS 0.8 12/12/2016 1129   EOSABS 0.1 12/12/2016 1129   BASOSABS 0.1 12/12/2016 1129   Iron/TIBC/Ferritin/ %Sat No results found for: IRON, TIBC, FERRITIN, IRONPCTSAT Lipid Panel     Component Value Date/Time   CHOL 161 08/04/2018 1052   TRIG 105 08/04/2018 1052   TRIG 162 (H) 10/25/2005 1507   HDL 66 08/04/2018 1052   CHOLHDL 2.4 08/04/2018 1052   CHOLHDL 3 01/14/2018 0752   VLDL 25.8 01/14/2018 0752   LDLCALC 74 08/04/2018 1052    LDLDIRECT 151.0 01/05/2016 0748   Hepatic Function Panel     Component Value Date/Time   PROT 6.6 08/04/2018 1052   ALBUMIN 4.5 08/04/2018 1052   AST 18 08/04/2018 1052   ALT 16 08/04/2018 1052   ALKPHOS 56 08/04/2018 1052   BILITOT 0.4 08/04/2018 1052   BILIDIR 0.15 08/04/2018 1052      Component Value Date/Time   TSH 2.560 08/14/2018 1209   TSH 4.14 01/11/2015 1111   TSH 2.99 05/18/2013 0847     Ref. Range 08/14/2018 12:09  Vitamin D, 25-Hydroxy Latest Ref Range: 30.0 - 100.0 ng/mL 36.3    OBESITY BEHAVIORAL INTERVENTION VISIT  Today's visit was # 2   Starting weight: 267 lbs Starting date: 08/14/2018 Today's weight : 259 lbs Today's date: 09/01/2018 Total lbs lost to date: 8    09/01/2018  Height 5\' 7"  (1.702 m)  Weight 259 lb (117.5 kg)  BMI (Calculated) 40.56  BLOOD PRESSURE - SYSTOLIC [id number]  BLOOD PRESSURE - DIASTOLIC 59   Body Fat % 123XX123 %  Total Body Water (lbs) 124.4 lbs    ASK: We discussed the diagnosis of obesity with [name] today and Clifford agreed to give [place] permission to discuss obesity behavioral modification therapy today.  ASSESS: Josiya has the diagnosis of obesity and his BMI today is 40.56 Treyvion is in the action stage of change   ADVISE: Atlas was educated on the multiple health risks of obesity as well as the benefit of weight loss to improve his health. He was advised of the need for long term treatment and the importance of lifestyle modifications to improve his current health and to decrease his risk of future health problems.  AGREE: Multiple dietary modification options and treatment options were discussed and  Jahsir agreed to follow the recommendations documented in the above note.  ARRANGE: Jamonta was educated on the importance  of frequent visits to treat obesity as outlined per CMS and USPSTF guidelines and agreed to schedule his next follow up appointment today.  Corey Skains, am acting as Location manager for [employer]. Owens Shark, DO  I have reviewed the above documentation for accuracy and completeness, and I agree with the above. -Jearld Lesch, DO

## 2018-09-03 ENCOUNTER — Encounter (INDEPENDENT_AMBULATORY_CARE_PROVIDER_SITE_OTHER): Payer: Self-pay | Admitting: Bariatrics

## 2018-09-15 ENCOUNTER — Ambulatory Visit (INDEPENDENT_AMBULATORY_CARE_PROVIDER_SITE_OTHER): Payer: Medicare HMO | Admitting: Family Medicine

## 2018-09-15 ENCOUNTER — Other Ambulatory Visit: Payer: Self-pay

## 2018-09-15 VITALS — BP 110/54 | HR 55 | Temp 97.8°F | Ht 67.0 in | Wt 256.0 lb

## 2018-09-15 DIAGNOSIS — E8881 Metabolic syndrome: Secondary | ICD-10-CM | POA: Diagnosis not present

## 2018-09-15 DIAGNOSIS — Z6841 Body Mass Index (BMI) 40.0 and over, adult: Secondary | ICD-10-CM | POA: Diagnosis not present

## 2018-09-16 NOTE — Progress Notes (Signed)
Office: [phone]  /  Fax: [phone]   HPI:   Chief Complaint: OBESITY Gregory [name] is here to discuss his progress with his obesity treatment plan. He is on the Category 4 plan and is following his eating plan approximately 100 % of the time. He states he is walking on the job 1 to 2 miles 5 times per week. Gregory [name] doesn't always eat all of the food on the plan, such as the bread. He does tend to eat too many grapes. His wife prepares his food for him. His weight is 256 lb (116.1 kg) today and has had a weight loss of 3 pounds over a period of 2 weeks since his last visit. He has lost 11 lbs since starting treatment with [place].  Insulin Resistance Gregory [name] has a diagnosis of insulin resistance based on his elevated fasting insulin level >5. Although Harman's blood glucose readings are still under good control, insulin resistance puts him at greater risk of metabolic syndrome and diabetes. Gregory [name] is not on metformin and he continues to work on diet and exercise to decrease risk of diabetes. Gregory [name] denies polyphagia.  ASSESSMENT AND PLAN:  Insulin resistance  Class 3 severe obesity with serious comorbidity and body mass index (BMI) of 40.0 to 44.9 in adult, unspecified obesity type ([place])  PLAN:  Insulin Resistance Gregory [name] will continue to work on weight loss, exercise, and decreasing simple carbohydrates in his diet to help decrease the risk of diabetes. We dicussed metformin including benefits and risks. He was informed that eating too many simple carbohydrates or too many calories at one sitting increases the likelihood of GI side effects. Gregory [name] will continue with the meal plan and follow up with [place] as directed to monitor his progress. At least 15 minutes were spent on discussing the following behavioral intervention visit.  Obesity Keydon is currently in the action stage of change. As such, his goal is to continue with weight loss efforts He has agreed to follow the Category 4 plan  [name] will continue his current exercise regimen for weight loss and overall health benefits. We discussed the following Behavioral Modification Strategies today: planning for success and decreasing simple carbohydrates   Gregory [name] may eat 20 grapes, rather than apples. I encouraged him to eat all of the food on the plan, including the bread.  Gregory [name] has agreed to follow up with our clinic in 2 weeks. He was informed of the importance of frequent follow up visits to maximize his success with intensive lifestyle modifications for his multiple health conditions.  ALLERGIES: No Known Allergies  MEDICATIONS: Current Outpatient Medications on File Prior to Visit  Medication Sig Dispense Refill  . ALPRAZolam (XANAX) 0.5 MG tablet Take 1 tablet (0.5 mg total) by mouth daily as needed for anxiety (30 min prior to plane flight or prn anxiety). 20 tablet 0  . amLODipine (NORVASC) 10 MG tablet Take 1 tablet by mouth once daily 90 tablet 1  . aspirin 81 MG chewable tablet Chew by mouth daily.    Gregory [name] atorvastatin (LIPITOR) 40 MG tablet Take 1 tablet by mouth once daily 90 tablet 2  . furosemide (LASIX) 40 MG tablet Take 1 tablet (40 mg total) by mouth daily. 90 tablet 3  . ibuprofen (ADVIL,MOTRIN) 200 MG tablet Take 800 mg by mouth 2 (two) times daily.    Gregory [name] lisinopril (ZESTRIL) 40 MG tablet Take 1 tablet by mouth once daily 90 tablet 1  . Misc Natural Products (OSTEO BI-FLEX/5-LOXIN ADVANCED PO) Take by mouth.    Gregory [name]  potassium chloride SA (K-DUR,KLOR-CON) 20 MEQ tablet Take 1 tablet (20 mEq total) by mouth daily. 90 tablet 3  . Vitamin D, Ergocalciferol, (DRISDOL) 1.25 MG (50000 UT) CAPS capsule Take 1 capsule (50,000 Units total) by mouth every 7 (seven) days. 4 capsule 0  . Ferrous Sulfate (IRON) 325 (65 Fe) MG TABS Take by mouth.     No current facility-administered medications on file prior to visit.     PAST MEDICAL HISTORY: Past Medical History:  Diagnosis Date  . Alcohol abuse   . Anxiety with  flying   . Aortic insufficiency   . Drug use   . Edema of both lower extremities   . Glaucoma   . Heart murmur   . Hypertension   . Joint pain   . OSA (obstructive sleep apnea)   . Shingles   . SOB (shortness of breath)     PAST SURGICAL HISTORY: Past Surgical History:  Procedure Laterality Date  . CATARACT EXTRACTION    . None      SOCIAL HISTORY: Social History   Tobacco Use  . Smoking status: Never Smoker  . Smokeless tobacco: Never Used  Substance Use Topics  . Alcohol use: Yes    Alcohol/week: 5.0 standard drinks    Types: 5 Cans of beer per week  . Drug use: No    FAMILY HISTORY: Family History  Adopted: Yes  Problem Relation Age of Onset  . Cancer Mother        breast cancer  . Alcohol abuse Father   . Breast cancer Sister     ROS: Review of Systems  Constitutional: Positive for weight loss.  Endo/Heme/Allergies:       Negative for polyphagia    PHYSICAL EXAM: Blood pressure (!) 110/54, pulse (!) 55, temperature 97.8 F (36.6 C), temperature source Oral, height 5\' 7"  (1.702 m), weight 256 lb (116.1 kg), SpO2 95 %. Body mass index is 40.1 kg/m. Physical Exam Vitals signs reviewed.  Constitutional:      Appearance: Normal appearance. He is well-developed. He is obese.  Cardiovascular:     Rate and Rhythm: Normal rate.  Pulmonary:     Effort: Pulmonary effort is normal.  Musculoskeletal: Normal range of motion.  Skin:    General: Skin is warm and dry.  Neurological:     Mental Status: He is alert and oriented to person, place, and time.  Psychiatric:        Mood and Affect: Mood normal.        Behavior: Behavior normal.     RECENT LABS AND TESTS: BMET    Component Value Date/Time   NA 143 08/04/2018 1052   K 4.9 08/04/2018 1052   CL 102 08/04/2018 1052   CO2 24 08/04/2018 1052   GLUCOSE 94 08/04/2018 1052   GLUCOSE 99 01/14/2018 0752   GLUCOSE 92 10/25/2005 1507   BUN 15 08/04/2018 1052   CREATININE 0.95 08/04/2018 1052    CALCIUM 9.1 08/04/2018 1052   GFRNONAA 81 08/04/2018 1052   GFRAA 94 08/04/2018 1052   Lab Results  Component Value Date   HGBA1C 5.6 08/14/2018   HGBA1C 5.8 01/14/2018   HGBA1C 5.9 12/12/2016   HGBA1C 5.9 04/11/2016   HGBA1C 5.7 01/05/2016   Lab Results  Component Value Date   INSULIN 11.4 08/14/2018   CBC    Component Value Date/Time   WBC 6.8 12/12/2016 1129   RBC 4.12 (L) 12/12/2016 1129   HGB 13.3 12/12/2016 1129   HCT  40.1 12/12/2016 1129   PLT 204.0 12/12/2016 1129   MCV 97.5 12/12/2016 1129   MCHC 33.2 12/12/2016 1129   RDW 13.8 12/12/2016 1129   LYMPHSABS 1.8 12/12/2016 1129   MONOABS 0.8 12/12/2016 1129   EOSABS 0.1 12/12/2016 1129   BASOSABS 0.1 12/12/2016 1129   Iron/TIBC/Ferritin/ %Sat No results found for: IRON, TIBC, FERRITIN, IRONPCTSAT Lipid Panel     Component Value Date/Time   CHOL 161 08/04/2018 1052   TRIG 105 08/04/2018 1052   TRIG 162 (H) 10/25/2005 1507   HDL 66 08/04/2018 1052   CHOLHDL 2.4 08/04/2018 1052   CHOLHDL 3 01/14/2018 0752   VLDL 25.8 01/14/2018 0752   LDLCALC 74 08/04/2018 1052   LDLDIRECT 151.0 01/05/2016 0748   Hepatic Function Panel     Component Value Date/Time   PROT 6.6 08/04/2018 1052   ALBUMIN 4.5 08/04/2018 1052   AST 18 08/04/2018 1052   ALT 16 08/04/2018 1052   ALKPHOS 56 08/04/2018 1052   BILITOT 0.4 08/04/2018 1052   BILIDIR 0.15 08/04/2018 1052      Component Value Date/Time   TSH 2.560 08/14/2018 1209   TSH 4.14 01/11/2015 1111   TSH 2.99 05/18/2013 0847     Ref. Range 08/14/2018 12:09  Vitamin D, 25-Hydroxy Latest Ref Range: 30.0 - 100.0 ng/mL 36.3    OBESITY BEHAVIORAL INTERVENTION VISIT  Today's visit was # 3   Starting weight: 267 lbs Starting date: 08/14/2018 Today's weight : 256 lbs Today's date: 09/15/2018 Total lbs lost to date: 11    09/15/2018  Height 5\' 7"  (1.702 m)  Weight 256 lb (116.1 kg)  BMI (Calculated) 40.09  BLOOD PRESSURE - SYSTOLIC [id number]  BLOOD PRESSURE - DIASTOLIC 54    Body Fat % 36.9 %  Total Body Water (lbs) 119 lbs    ASK: We discussed the diagnosis of obesity with [name] today and Broly agreed to give [place] permission to discuss obesity behavioral modification therapy today.  ASSESS: Malick has the diagnosis of obesity and his BMI today is 40.09 Jagdish is in the action stage of change   ADVISE: Xaylen was educated on the multiple health risks of obesity as well as the benefit of weight loss to improve his health. He was advised of the need for long term treatment and the importance of lifestyle modifications to improve his current health and to decrease his risk of future health problems.  AGREE: Multiple dietary modification options and treatment options were discussed and  Kodi agreed to follow the recommendations documented in the above note.  ARRANGE: Mcarthur was educated on the importance of frequent visits to treat obesity as outlined per CMS and USPSTF guidelines and agreed to schedule his next follow up appointment today.  Corey Skains, am acting as Location manager for [employer], FNP-C.  I have reviewed the above documentation for accuracy and completeness, and I agree with the above.  - Hadlei Stitt, FNP-C.

## 2018-09-17 ENCOUNTER — Encounter (INDEPENDENT_AMBULATORY_CARE_PROVIDER_SITE_OTHER): Payer: Self-pay | Admitting: Family Medicine

## 2018-09-17 DIAGNOSIS — Z6839 Body mass index (BMI) 39.0-39.9, adult: Secondary | ICD-10-CM | POA: Insufficient documentation

## 2018-09-17 DIAGNOSIS — E8881 Metabolic syndrome: Secondary | ICD-10-CM | POA: Insufficient documentation

## 2018-09-29 ENCOUNTER — Ambulatory Visit (INDEPENDENT_AMBULATORY_CARE_PROVIDER_SITE_OTHER): Payer: Medicare HMO | Admitting: Family Medicine

## 2018-09-29 ENCOUNTER — Other Ambulatory Visit: Payer: Self-pay

## 2018-09-29 VITALS — BP 107/52 | HR 60 | Ht 67.0 in | Wt 258.0 lb

## 2018-09-29 DIAGNOSIS — E119 Type 2 diabetes mellitus without complications: Secondary | ICD-10-CM | POA: Diagnosis not present

## 2018-09-29 DIAGNOSIS — Z6841 Body Mass Index (BMI) 40.0 and over, adult: Secondary | ICD-10-CM | POA: Diagnosis not present

## 2018-09-29 DIAGNOSIS — E559 Vitamin D deficiency, unspecified: Secondary | ICD-10-CM | POA: Diagnosis not present

## 2018-09-29 DIAGNOSIS — E66813 Obesity, class 3: Secondary | ICD-10-CM

## 2018-09-29 MED ORDER — VITAMIN D (ERGOCALCIFEROL) 1.25 MG (50000 UNIT) PO CAPS: 50000.0000 [IU] | ORAL_CAPSULE | ORAL | 0 refills | Status: DC

## 2018-09-30 ENCOUNTER — Encounter (INDEPENDENT_AMBULATORY_CARE_PROVIDER_SITE_OTHER): Payer: Self-pay | Admitting: Family Medicine

## 2018-09-30 DIAGNOSIS — E559 Vitamin D deficiency, unspecified: Secondary | ICD-10-CM | POA: Insufficient documentation

## 2018-09-30 NOTE — Progress Notes (Signed)
Office: [phone]  /  Fax: [phone]   HPI:   Chief Complaint: OBESITY Gregory [name] is here to discuss his progress with his obesity treatment plan. He is on the  follow the Category 4 plan and is following his eating plan approximately 95 % of the time. He states he is exercising by walking at work 1-2 miles daily 5 times per week.   Alexis has been off his plan because wife is out of town caring for her mother. She usually fixes his meals. He has been off the plan mostly at dinner. He is also not eating all his protein throughout the day.  His weight is 258 lb (117 kg) today and has had a weight gain of 2 pounds over a period of 2 weeks since his last visit. He has lost 9 lbs since starting treatment with [place].  Vitamin D deficiency Gregory [name] has a diagnosis of vitamin D deficiency. He is currently taking vit D and denies nausea, vomiting or muscle weakness. He reports fatigue. Vit D level not yet at goal but has improved with vit D prescription.   Ref. Range 08/14/2018 12:09  Vitamin D, 25-Hydroxy Latest Ref Range: 30.0 - 100.0 ng/mL 36.3   Diabetes II Gregory [name] has a diagnosis of diabetes type II. Gregory [name] diabetes is well controlled with diet only. He  denies any hypoglycemic episodes. Last A1c was 5.6.  He has been working on intensive lifestyle modifications including diet, exercise, and weight loss to help control his blood glucose levels. Lab Results  Component Value Date   HGBA1C 5.6 08/14/2018     ASSESSMENT AND PLAN:  Vitamin D deficiency - Plan: Vitamin D, Ergocalciferol, (DRISDOL) 1.25 MG (50000 UT) CAPS capsule  Type 2 diabetes mellitus without complication, without long-term current use of insulin (HCC)  Class 3 severe obesity with serious comorbidity and body mass index (BMI) of 40.0 to 44.9 in adult, unspecified obesity type ([employer])  PLAN: Vitamin D Deficiency Gregory [name] was informed that low vitamin D levels contributes to fatigue and are associated with obesity, breast, and  colon cancer. He agrees to continue to take prescription Vit D @50 ,000 IU every week #4 with no refills  and will follow up for routine testing of vitamin D, at least 2-3 times per year. He was informed of the risk of over-replacement of vitamin D and agrees to not increase his dose unless he discusses this with [place] first. Agrees to follow up with our clinic as directed.   Diabetes II Gregory [name] has been given extensive diabetes education by myself today including ideal fasting and post-prandial blood glucose readings, individual ideal HgA1c goals  and hypoglycemia prevention. We discussed the importance of good blood sugar control to decrease the likelihood of diabetic complications such as nephropathy, neuropathy, limb loss, blindness, coronary artery disease, and death. We discussed the importance of intensive lifestyle modification including diet, exercise and weight loss as the first line treatment for diabetes. Gregory [name] agrees to continue his diabetes medications and will follow up at the agreed upon time.  Obesity Gregory [name] is currently in the action stage of change. As such, his goal is to continue with weight loss efforts He has agreed to follow a lower carbohydrate, vegetable and lean protein rich diet plan and follow the Category 4 plan Gregory [name] has been instructed to continue exercise as above for weight loss and overall health benefits. We discussed the following Behavioral Modification Strategies today:planning for success,  increasing lean protein intake and work on meal planning and easy  cooking plans   Gregory [name] has agreed to follow up with our clinic in 2 weeks. He was informed of the importance of frequent follow up visits to maximize his success with intensive lifestyle modifications for his multiple health conditions.  ALLERGIES: No Known Allergies  MEDICATIONS: Current Outpatient Medications on File Prior to Visit  Medication Sig Dispense Refill  . ALPRAZolam (XANAX) 0.5 MG tablet Take 1  tablet (0.5 mg total) by mouth daily as needed for anxiety (30 min prior to plane flight or prn anxiety). 20 tablet 0  . amLODipine (NORVASC) 10 MG tablet Take 1 tablet by mouth once daily 90 tablet 1  . aspirin 81 MG chewable tablet Chew by mouth daily.    [name] atorvastatin (LIPITOR) 40 MG tablet Take 1 tablet by mouth once daily 90 tablet 2  . Ferrous Sulfate (IRON) 325 (65 Fe) MG TABS Take by mouth.    . furosemide (LASIX) 40 MG tablet Take 1 tablet (40 mg total) by mouth daily. 90 tablet 3  . ibuprofen (ADVIL,MOTRIN) 200 MG tablet Take 800 mg by mouth 2 (two) times daily.    [name] lisinopril (ZESTRIL) 40 MG tablet Take 1 tablet by mouth once daily 90 tablet 1  . Misc Natural Products (OSTEO BI-FLEX/5-LOXIN ADVANCED PO) Take by mouth.    . potassium chloride SA (K-DUR,KLOR-CON) 20 MEQ tablet Take 1 tablet (20 mEq total) by mouth daily. 90 tablet 3   No current facility-administered medications on file prior to visit.     PAST MEDICAL HISTORY: Past Medical History:  Diagnosis Date  . Alcohol abuse   . Anxiety with flying   . Aortic insufficiency   . Drug use   . Edema of both lower extremities   . Glaucoma   . Heart murmur   . Hypertension   . Joint pain   . OSA (obstructive sleep apnea)   . Shingles   . SOB (shortness of breath)     PAST SURGICAL HISTORY: Past Surgical History:  Procedure Laterality Date  . CATARACT EXTRACTION    . None      SOCIAL HISTORY: Social History   Tobacco Use  . Smoking status: Never Smoker  . Smokeless tobacco: Never Used  Substance Use Topics  . Alcohol use: Yes    Alcohol/week: 5.0 standard drinks    Types: 5 Cans of beer per week  . Drug use: No    FAMILY HISTORY: Family History  Adopted: Yes  Problem Relation Age of Onset  . Cancer Mother        breast cancer  . Alcohol abuse Father   . Breast cancer Sister     ROS: Review of Systems  Constitutional: Negative for weight loss.  Gastrointestinal: Negative for nausea and  vomiting.  Musculoskeletal:       Negative for muscle weakness  Endo/Heme/Allergies:       Negative for hypoglycemia     PHYSICAL EXAM: Blood pressure (!) 107/52, pulse 60, height 5\' 7"  (1.702 m), weight 258 lb (117 kg), SpO2 95 %. Body mass index is 40.41 kg/m. Physical Exam Vitals signs reviewed.  Constitutional:      Appearance: Normal appearance. He is obese.  HENT:     Head: Normocephalic.     Nose: Nose normal.  Neck:     Musculoskeletal: Normal range of motion.  Cardiovascular:     Rate and Rhythm: Normal rate.     Pulses: Normal pulses.  Pulmonary:     Effort: Pulmonary effort is normal.  Musculoskeletal: Normal range of motion.  Skin:    General: Skin is warm and dry.  Neurological:     Mental Status: He is alert and oriented to person, place, and time.  Psychiatric:        Mood and Affect: Mood normal.        Behavior: Behavior normal.     RECENT LABS AND TESTS: BMET    Component Value Date/Time   NA 143 08/04/2018 1052   K 4.9 08/04/2018 1052   CL 102 08/04/2018 1052   CO2 24 08/04/2018 1052   GLUCOSE 94 08/04/2018 1052   GLUCOSE 99 01/14/2018 0752   GLUCOSE 92 10/25/2005 1507   BUN 15 08/04/2018 1052   CREATININE 0.95 08/04/2018 1052   CALCIUM 9.1 08/04/2018 1052   GFRNONAA 81 08/04/2018 1052   GFRAA 94 08/04/2018 1052   Lab Results  Component Value Date   HGBA1C 5.6 08/14/2018   HGBA1C 5.8 01/14/2018   HGBA1C 5.9 12/12/2016   HGBA1C 5.9 04/11/2016   HGBA1C 5.7 01/05/2016   Lab Results  Component Value Date   INSULIN 11.4 08/14/2018   CBC    Component Value Date/Time   WBC 6.8 12/12/2016 1129   RBC 4.12 (L) 12/12/2016 1129   HGB 13.3 12/12/2016 1129   HCT 40.1 12/12/2016 1129   PLT 204.0 12/12/2016 1129   MCV 97.5 12/12/2016 1129   MCHC 33.2 12/12/2016 1129   RDW 13.8 12/12/2016 1129   LYMPHSABS 1.8 12/12/2016 1129   MONOABS 0.8 12/12/2016 1129   EOSABS 0.1 12/12/2016 1129   BASOSABS 0.1 12/12/2016 1129   Iron/TIBC/Ferritin/  %Sat No results found for: IRON, TIBC, FERRITIN, IRONPCTSAT Lipid Panel     Component Value Date/Time   CHOL 161 08/04/2018 1052   TRIG 105 08/04/2018 1052   TRIG 162 (H) 10/25/2005 1507   HDL 66 08/04/2018 1052   CHOLHDL 2.4 08/04/2018 1052   CHOLHDL 3 01/14/2018 0752   VLDL 25.8 01/14/2018 0752   LDLCALC 74 08/04/2018 1052   LDLDIRECT 151.0 01/05/2016 0748   Hepatic Function Panel     Component Value Date/Time   PROT 6.6 08/04/2018 1052   ALBUMIN 4.5 08/04/2018 1052   AST 18 08/04/2018 1052   ALT 16 08/04/2018 1052   ALKPHOS 56 08/04/2018 1052   BILITOT 0.4 08/04/2018 1052   BILIDIR 0.15 08/04/2018 1052      Component Value Date/Time   TSH 2.560 08/14/2018 1209   TSH 4.14 01/11/2015 1111   TSH 2.99 05/18/2013 0847    Ref. Range 08/14/2018 12:09  Vitamin D, 25-Hydroxy Latest Ref Range: 30.0 - 100.0 ng/mL 36.3      OBESITY BEHAVIORAL INTERVENTION VISIT  Today's visit was # 4   Starting weight: 267 lbs Starting date: 08/14/18 Today's weight : Weight: 258 lb (117 kg)  Today's date: 09/29/18 Total lbs lost to date: 9 lbs At least 15 minutes were spent on discussing the following behavioral intervention visit.   ASK: We discussed the diagnosis of obesity with Gregory [name] today and Gregory [name] agreed to give [place] permission to discuss obesity behavioral modification therapy today.  ASSESS: Gregory [name] has the diagnosis of obesity and his BMI today is 40.4 Gregory [name] is in the action stage of change   ADVISE: Gregory [name] was educated on the multiple health risks of obesity as well as the benefit of weight loss to improve his health. He was advised of the need for long term treatment and the importance of lifestyle modifications to improve his current  health and to decrease his risk of future health problems.  AGREE: Multiple dietary modification options and treatment options were discussed and  Gregory [name] agreed to follow the recommendations documented in the above note.  ARRANGE:  Gregory [name] was educated on the importance of frequent visits to treat obesity as outlined per CMS and USPSTF guidelines and agreed to schedule his next follow up appointment today.  Leary Roca, am acting as transcriptionist for [employer], FNP   I have reviewed the above documentation for accuracy and completeness, and I agree with the above.  -  , FNP-C.

## 2018-10-01 DIAGNOSIS — H40013 Open angle with borderline findings, low risk, bilateral: Secondary | ICD-10-CM | POA: Diagnosis not present

## 2018-10-13 ENCOUNTER — Other Ambulatory Visit: Payer: Self-pay

## 2018-10-13 ENCOUNTER — Ambulatory Visit (INDEPENDENT_AMBULATORY_CARE_PROVIDER_SITE_OTHER): Payer: Medicare HMO | Admitting: Family Medicine

## 2018-10-13 VITALS — BP 125/56 | HR 65 | Temp 98.0°F | Ht 67.0 in | Wt 255.0 lb

## 2018-10-13 DIAGNOSIS — Z6839 Body mass index (BMI) 39.0-39.9, adult: Secondary | ICD-10-CM

## 2018-10-13 DIAGNOSIS — E119 Type 2 diabetes mellitus without complications: Secondary | ICD-10-CM

## 2018-10-14 NOTE — Progress Notes (Signed)
Office: [phone]  /  Fax: [phone]   HPI:   Chief Complaint: OBESITY Gregory [name] is here to discuss his progress with his obesity treatment plan. He is on the Category 4 plan and is following his eating plan approximately 80 to 90 % of the time. He states he is exercising 0 minutes 0 times per week. Gregory [name] has been cooking more for himself since his wife has been away helping her mom who is ill. He has a lot of stress right now  - at work and personal. His appetite is a little of, due to stress. He is not getting all of the protein in. His weight is 255 lb (115.7 kg) today and has had a weight loss of 3 pounds over a period of 2 weeks since his last visit. He has lost 12 lbs since starting treatment with [place].  Diabetes II Gregory [name] has a diagnosis of diabetes type II. Gregory [name] does not check his blood sugars at home. He is on metformin only and his last A1c was at 5.6 on 08/14/18. Gregory [name] denies hypoglycemia or polyphagia. He has been working on intensive lifestyle modifications including diet, exercise, and weight loss to help control his blood glucose levels.  ASSESSMENT AND PLAN:  Type 2 diabetes mellitus without complication, without long-term current use of insulin (HCC)  Class 2 severe obesity with serious comorbidity and body mass index (BMI) of 39.0 to 39.9 in adult, unspecified obesity type (HCC)  PLAN:  Diabetes II Gregory [name] has been given extensive diabetes education by myself today including ideal fasting and post-prandial blood glucose readings, individual ideal Hgb A1c goals and hypoglycemia prevention. We discussed the importance of good blood sugar control to decrease the likelihood of diabetic complications such as nephropathy, neuropathy, limb loss, blindness, coronary artery disease, and death. We discussed the importance of intensive lifestyle modification including diet, exercise and weight loss as the first line treatment for diabetes. Gregory [name] will continue metformin and he  will follow up at the agreed upon time.  Obesity Gregory [name] is currently in the action stage of change. As such, his goal is to continue with weight loss efforts He has agreed to follow the Category 4 plan Gregory [name] has not been prescribed exercise at this time. We discussed the following Behavioral Modification Strategies today: planning for success, increasing lean protein intake and work on meal planning and easy cooking plans  We discussed exchanges for 2 ounces of protein.  Gregory [name] has agreed to follow up with our clinic in 2 weeks. He was informed of the importance of frequent follow up visits to maximize his success with intensive lifestyle modifications for his multiple health conditions.  ALLERGIES: No Known Allergies  MEDICATIONS: Current Outpatient Medications on File Prior to Visit  Medication Sig Dispense Refill  . ALPRAZolam (XANAX) 0.5 MG tablet Take 1 tablet (0.5 mg total) by mouth daily as needed for anxiety (30 min prior to plane flight or prn anxiety). 20 tablet 0  . amLODipine (NORVASC) 10 MG tablet Take 1 tablet by mouth once daily 90 tablet 1  . aspirin 81 MG chewable tablet Chew by mouth daily.    [name] atorvastatin (LIPITOR) 40 MG tablet Take 1 tablet by mouth once daily 90 tablet 2  . Ferrous Sulfate (IRON) 325 (65 Fe) MG TABS Take by mouth.    . furosemide (LASIX) 40 MG tablet Take 1 tablet (40 mg total) by mouth daily. 90 tablet 3  . ibuprofen (ADVIL,MOTRIN) 200 MG tablet Take 800 mg by mouth 2 (  two) times daily.    [name] lisinopril (ZESTRIL) 40 MG tablet Take 1 tablet by mouth once daily 90 tablet 1  . Misc Natural Products (OSTEO BI-FLEX/5-LOXIN ADVANCED PO) Take by mouth.    . potassium chloride SA (K-DUR,KLOR-CON) 20 MEQ tablet Take 1 tablet (20 mEq total) by mouth daily. 90 tablet 3  . Vitamin D, Ergocalciferol, (DRISDOL) 1.25 MG (50000 UT) CAPS capsule Take 1 capsule (50,000 Units total) by mouth every 7 (seven) days. 4 capsule 0   No current facility-administered  medications on file prior to visit.     PAST MEDICAL HISTORY: Past Medical History:  Diagnosis Date  . Alcohol abuse   . Anxiety with flying   . Aortic insufficiency   . Drug use   . Edema of both lower extremities   . Glaucoma   . Heart murmur   . Hypertension   . Joint pain   . OSA (obstructive sleep apnea)   . Shingles   . SOB (shortness of breath)     PAST SURGICAL HISTORY: Past Surgical History:  Procedure Laterality Date  . CATARACT EXTRACTION    . None      SOCIAL HISTORY: Social History   Tobacco Use  . Smoking status: Never Smoker  . Smokeless tobacco: Never Used  Substance Use Topics  . Alcohol use: Yes    Alcohol/week: 5.0 standard drinks    Types: 5 Cans of beer per week  . Drug use: No    FAMILY HISTORY: Family History  Adopted: Yes  Problem Relation Age of Onset  . Cancer Mother        breast cancer  . Alcohol abuse Father   . Breast cancer Sister     ROS: Review of Systems  Constitutional: Positive for weight loss.  Endo/Heme/Allergies:       Negative for hypoglycemia Negative for polyphagia  Psychiatric/Behavioral:       Positive for Stress    PHYSICAL EXAM: Blood pressure (!) 125/56, pulse 65, temperature 98 F (36.7 C), temperature source Oral, height 5\' 7"  (1.702 m), weight 255 lb (115.7 kg), SpO2 94 %. Body mass index is 39.94 kg/m. Physical Exam Vitals signs reviewed.  Constitutional:      Appearance: Normal appearance. He is well-developed. He is obese.  Cardiovascular:     Rate and Rhythm: Normal rate.  Pulmonary:     Effort: Pulmonary effort is normal.  Musculoskeletal: Normal range of motion.     Comments: Uses cane for ambulation.  Skin:    General: Skin is warm and dry.  Neurological:     Mental Status: He is alert and oriented to person, place, and time.  Psychiatric:        Mood and Affect: Mood normal.        Behavior: Behavior normal.     RECENT LABS AND TESTS: BMET    Component Value Date/Time    NA 143 08/04/2018 1052   K 4.9 08/04/2018 1052   CL 102 08/04/2018 1052   CO2 24 08/04/2018 1052   GLUCOSE 94 08/04/2018 1052   GLUCOSE 99 01/14/2018 0752   GLUCOSE 92 10/25/2005 1507   BUN 15 08/04/2018 1052   CREATININE 0.95 08/04/2018 1052   CALCIUM 9.1 08/04/2018 1052   GFRNONAA 81 08/04/2018 1052   GFRAA 94 08/04/2018 1052   Lab Results  Component Value Date   HGBA1C 5.6 08/14/2018   HGBA1C 5.8 01/14/2018   HGBA1C 5.9 12/12/2016   HGBA1C 5.9 04/11/2016   HGBA1C 5.7  01/05/2016   Lab Results  Component Value Date   INSULIN 11.4 08/14/2018   CBC    Component Value Date/Time   WBC 6.8 12/12/2016 1129   RBC 4.12 (L) 12/12/2016 1129   HGB 13.3 12/12/2016 1129   HCT 40.1 12/12/2016 1129   PLT 204.0 12/12/2016 1129   MCV 97.5 12/12/2016 1129   MCHC 33.2 12/12/2016 1129   RDW 13.8 12/12/2016 1129   LYMPHSABS 1.8 12/12/2016 1129   MONOABS 0.8 12/12/2016 1129   EOSABS 0.1 12/12/2016 1129   BASOSABS 0.1 12/12/2016 1129   Iron/TIBC/Ferritin/ %Sat No results found for: IRON, TIBC, FERRITIN, IRONPCTSAT Lipid Panel     Component Value Date/Time   CHOL 161 08/04/2018 1052   TRIG 105 08/04/2018 1052   TRIG 162 (H) 10/25/2005 1507   HDL 66 08/04/2018 1052   CHOLHDL 2.4 08/04/2018 1052   CHOLHDL 3 01/14/2018 0752   VLDL 25.8 01/14/2018 0752   LDLCALC 74 08/04/2018 1052   LDLDIRECT 151.0 01/05/2016 0748   Hepatic Function Panel     Component Value Date/Time   PROT 6.6 08/04/2018 1052   ALBUMIN 4.5 08/04/2018 1052   AST 18 08/04/2018 1052   ALT 16 08/04/2018 1052   ALKPHOS 56 08/04/2018 1052   BILITOT 0.4 08/04/2018 1052   BILIDIR 0.15 08/04/2018 1052      Component Value Date/Time   TSH 2.560 08/14/2018 1209   TSH 4.14 01/11/2015 1111   TSH 2.99 05/18/2013 0847     Ref. Range 08/14/2018 12:09  Vitamin D, 25-Hydroxy Latest Ref Range: 30.0 - 100.0 ng/mL 36.3    OBESITY BEHAVIORAL INTERVENTION VISIT  Today's visit was # 5   Starting weight: 267  lbs Starting date: 08/14/2018 Today's weight : 255 lbs Today's date: 10/13/2018 Total lbs lost to date: 12    10/13/2018  Height 5\' 7"  (1.702 m)  Weight 255 lb (115.7 kg)  BMI (Calculated) 39.93  BLOOD PRESSURE - SYSTOLIC 125  BLOOD PRESSURE - DIASTOLIC 56   Body Fat % 38 %  Total Body Water (lbs) 125 lbs    ASK: We discussed the diagnosis of obesity with Gregory [name] today and Gregory [name] agreed to give [place] permission to discuss obesity behavioral modification therapy today.  ASSESS: Gregory [name] has the diagnosis of obesity and his BMI today is 39.93 Gregory [name] is in the action stage of change   ADVISE: Gregory [name] was educated on the multiple health risks of obesity as well as the benefit of weight loss to improve his health. He was advised of the need for long term treatment and the importance of lifestyle modifications to improve his current health and to decrease his risk of future health problems.  AGREE: Multiple dietary modification options and treatment options were discussed and  Gregory [name] agreed to follow the recommendations documented in the above note.  ARRANGE: Gregory [name] was educated on the importance of frequent visits to treat obesity as outlined per CMS and USPSTF guidelines and agreed to schedule his next follow up appointment today.  I, [place] Crane, am acting as transcriptionist for [place], FNP-C  I have reviewed the above documentation for accuracy and completeness, and I agree with the above.  - Ladavion Savitz, FNP-C.

## 2018-10-15 ENCOUNTER — Encounter (INDEPENDENT_AMBULATORY_CARE_PROVIDER_SITE_OTHER): Payer: Self-pay | Admitting: Family Medicine

## 2018-10-27 ENCOUNTER — Ambulatory Visit (INDEPENDENT_AMBULATORY_CARE_PROVIDER_SITE_OTHER): Payer: Medicare HMO | Admitting: Family Medicine

## 2018-10-27 ENCOUNTER — Other Ambulatory Visit: Payer: Self-pay

## 2018-10-27 ENCOUNTER — Encounter (INDEPENDENT_AMBULATORY_CARE_PROVIDER_SITE_OTHER): Payer: Self-pay | Admitting: Family Medicine

## 2018-10-27 VITALS — BP 122/56 | HR 53 | Temp 97.6°F | Ht 67.0 in | Wt 253.0 lb

## 2018-10-27 DIAGNOSIS — E559 Vitamin D deficiency, unspecified: Secondary | ICD-10-CM | POA: Diagnosis not present

## 2018-10-27 DIAGNOSIS — Z6839 Body mass index (BMI) 39.0-39.9, adult: Secondary | ICD-10-CM | POA: Diagnosis not present

## 2018-10-27 DIAGNOSIS — E119 Type 2 diabetes mellitus without complications: Secondary | ICD-10-CM | POA: Diagnosis not present

## 2018-10-27 MED ORDER — VITAMIN D (ERGOCALCIFEROL) 1.25 MG (50000 UNIT) PO CAPS: 50000.0000 [IU] | ORAL_CAPSULE | ORAL | 0 refills | Status: DC

## 2018-10-28 NOTE — Progress Notes (Signed)
Office: [phone]  /  Fax: [phone]   HPI:   Chief Complaint: OBESITY Gregory [name] is here to discuss his progress with his obesity treatment plan. He is on the Category 4 plan and is following his eating plan approximately 90 % of the time. He states he is exercising 0 minutes 0 times per week. Gregory [name] is doing well on his plan, despite having to cook for himself. Hunger is well satisfied. His wife is now home. She had been away from home caring for her mother. His weight is 253 lb (114.8 kg) today and has had a weight loss of 2 pounds over a period of 2 weeks since his last visit. He has lost 14 lbs since starting treatment with [place].  Vitamin D deficiency Gregory [name] has a diagnosis of vitamin D deficiency. His last vitamin D level was at 36.3 on 08/14/18 and was not at goal. Gregory [name] is currently taking vit D and he denies nausea, vomiting or muscle weakness.  Diabetes II Gregory [name] has a diagnosis of diabetes type II which is well controlled with diet. He is not on metformin. Gregory [name] does not check CBGs at home. Gregory [name] denies any hypoglycemic episodes. Last A1c was at 5.6 on 08/14/18. He has been working on intensive lifestyle modifications including diet, exercise, and weight loss to help control his blood glucose levels.  ASSESSMENT AND PLAN:  Vitamin D deficiency - Plan: Vitamin D, Ergocalciferol, (DRISDOL) 1.25 MG (50000 UT) CAPS capsule, DISCONTINUED: Vitamin D, Ergocalciferol, (DRISDOL) 1.25 MG (50000 UT) CAPS capsule  Type 2 diabetes mellitus without complication, without long-term current use of insulin (HCC)  Class 2 severe obesity with serious comorbidity and body mass index (BMI) of 39.0 to 39.9 in adult, unspecified obesity type ([place])  PLAN:  Vitamin D Deficiency Gregory [name] was informed that low vitamin D levels contributes to fatigue and are associated with obesity, breast, and colon cancer. Gregory [name] agrees to continue to take prescription Vit D @50 ,000 IU every week #4 with no refills and  he will follow up for routine testing of vitamin D, at least 2-3 times per year. He was informed of the risk of over-replacement of vitamin D and agrees to not increase his dose unless he discusses this with [place] first. We will check vitamin D level next month and Gregory [name] agrees to follow up as directed.  Diabetes II Gregory [name] has been given extensive diabetes education by myself today including ideal fasting and post-prandial blood glucose readings, individual ideal Hgb A1c goals and hypoglycemia prevention. We discussed the importance of good blood sugar control to decrease the likelihood of diabetic complications such as nephropathy, neuropathy, limb loss, blindness, coronary artery disease, and death. We discussed the importance of intensive lifestyle modification including diet, exercise and weight loss as the first line treatment for diabetes. Gregory [name] will continue with the meal plan and he will follow up at the agreed upon time.  Obesity Gregory [name] is currently in the action stage of change. 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 We discussed the following Behavioral Modification Strategies today: planning for success, increasing lean protein intake and work on meal planning and easy cooking plans Handout for egg muffin recipe was given to patient today.  Gregory [name] has agreed to follow up with our clinic in 2 to 3 weeks. He was informed of the importance of frequent follow up  visits to maximize his success with intensive lifestyle modifications for his multiple health conditions.  ALLERGIES: No Known Allergies  MEDICATIONS: Current Outpatient Medications on File Prior to Visit  Medication Sig Dispense Refill  . ALPRAZolam (XANAX) 0.5 MG tablet Take 1 tablet (0.5 mg total) by mouth daily as needed for anxiety (30 min prior to plane flight or prn anxiety). 20 tablet 0   . amLODipine (NORVASC) 10 MG tablet Take 1 tablet by mouth once daily 90 tablet 1  . aspirin 81 MG chewable tablet Chew by mouth daily.    [name] atorvastatin (LIPITOR) 40 MG tablet Take 1 tablet by mouth once daily 90 tablet 2  . Ferrous Sulfate (IRON) 325 (65 Fe) MG TABS Take by mouth.    . furosemide (LASIX) 40 MG tablet Take 1 tablet (40 mg total) by mouth daily. 90 tablet 3  . ibuprofen (ADVIL,MOTRIN) 200 MG tablet Take 800 mg by mouth 2 (two) times daily.    [name] lisinopril (ZESTRIL) 40 MG tablet Take 1 tablet by mouth once daily 90 tablet 1  . Misc Natural Products (OSTEO BI-FLEX/5-LOXIN ADVANCED PO) Take by mouth.    . potassium chloride SA (K-DUR,KLOR-CON) 20 MEQ tablet Take 1 tablet (20 mEq total) by mouth daily. 90 tablet 3   No current facility-administered medications on file prior to visit.     PAST MEDICAL HISTORY: Past Medical History:  Diagnosis Date  . Alcohol abuse   . Anxiety with flying   . Aortic insufficiency   . Drug use   . Edema of both lower extremities   . Glaucoma   . Heart murmur   . Hypertension   . Joint pain   . OSA (obstructive sleep apnea)   . Shingles   . SOB (shortness of breath)     PAST SURGICAL HISTORY: Past Surgical History:  Procedure Laterality Date  . CATARACT EXTRACTION    . None      SOCIAL HISTORY: Social History   Tobacco Use  . Smoking status: Never Smoker  . Smokeless tobacco: Never Used  Substance Use Topics  . Alcohol use: Yes    Alcohol/week: 5.0 standard drinks    Types: 5 Cans of beer per week  . Drug use: No    FAMILY HISTORY: Family History  Adopted: Yes  Problem Relation Age of Onset  . Cancer Mother        breast cancer  . Alcohol abuse Father   . Breast cancer Sister     ROS: Review of Systems  Constitutional: Positive for weight loss.  Gastrointestinal: Negative for nausea and vomiting.  Musculoskeletal:       Negative for muscle weakness  Endo/Heme/Allergies:       Negative for hypoglycemia     PHYSICAL EXAM: Blood pressure (!) 122/56, pulse (!) 53, temperature 97.6 F (36.4 C), temperature source Oral, height 5\' 7"  (1.702 m), weight 253 lb (114.8 kg), SpO2 96 %. Body mass index is 39.63 kg/m. Physical Exam Vitals signs reviewed.  Constitutional:      Appearance: Normal appearance. He is well-developed. He is obese.  Cardiovascular:     Rate and Rhythm: Normal rate.  Pulmonary:     Effort: Pulmonary effort is normal.  Musculoskeletal: Normal range of motion.  Skin:    General: Skin is warm and dry.  Neurological:     Mental Status: He is alert and oriented to person, place, and time.  Psychiatric:        Mood and Affect: Mood  normal.        Behavior: Behavior normal.     RECENT LABS AND TESTS: BMET    Component Value Date/Time   NA 143 08/04/2018 1052   K 4.9 08/04/2018 1052   CL 102 08/04/2018 1052   CO2 24 08/04/2018 1052   GLUCOSE 94 08/04/2018 1052   GLUCOSE 99 01/14/2018 0752   GLUCOSE 92 10/25/2005 1507   BUN 15 08/04/2018 1052   CREATININE 0.95 08/04/2018 1052   CALCIUM 9.1 08/04/2018 1052   GFRNONAA 81 08/04/2018 1052   GFRAA 94 08/04/2018 1052   Lab Results  Component Value Date   HGBA1C 5.6 08/14/2018   HGBA1C 5.8 01/14/2018   HGBA1C 5.9 12/12/2016   HGBA1C 5.9 04/11/2016   HGBA1C 5.7 01/05/2016   Lab Results  Component Value Date   INSULIN 11.4 08/14/2018   CBC    Component Value Date/Time   WBC 6.8 12/12/2016 1129   RBC 4.12 (L) 12/12/2016 1129   HGB 13.3 12/12/2016 1129   HCT 40.1 12/12/2016 1129   PLT 204.0 12/12/2016 1129   MCV 97.5 12/12/2016 1129   MCHC 33.2 12/12/2016 1129   RDW 13.8 12/12/2016 1129   LYMPHSABS 1.8 12/12/2016 1129   MONOABS 0.8 12/12/2016 1129   EOSABS 0.1 12/12/2016 1129   BASOSABS 0.1 12/12/2016 1129   Iron/TIBC/Ferritin/ %Sat No results found for: IRON, TIBC, FERRITIN, IRONPCTSAT Lipid Panel     Component Value Date/Time   CHOL 161 08/04/2018 1052   TRIG 105 08/04/2018 1052   TRIG 162 (H)  10/25/2005 1507   HDL 66 08/04/2018 1052   CHOLHDL 2.4 08/04/2018 1052   CHOLHDL 3 01/14/2018 0752   VLDL 25.8 01/14/2018 0752   LDLCALC 74 08/04/2018 1052   LDLDIRECT 151.0 01/05/2016 0748   Hepatic Function Panel     Component Value Date/Time   PROT 6.6 08/04/2018 1052   ALBUMIN 4.5 08/04/2018 1052   AST 18 08/04/2018 1052   ALT 16 08/04/2018 1052   ALKPHOS 56 08/04/2018 1052   BILITOT 0.4 08/04/2018 1052   BILIDIR 0.15 08/04/2018 1052      Component Value Date/Time   TSH 2.560 08/14/2018 1209   TSH 4.14 01/11/2015 1111   TSH 2.99 05/18/2013 0847     Ref. Range 08/14/2018 12:09  Vitamin D, 25-Hydroxy Latest Ref Range: 30.0 - 100.0 ng/mL 36.3    OBESITY BEHAVIORAL INTERVENTION VISIT  Today's visit was # 6   Starting weight: 267 lbs Starting date: 08/14/2018 Today's weight : 253 lbs Today's date: 10/27/2018 Total lbs lost to date: 14    10/27/2018  Height 5\' 7"  (1.702 m)  Weight 253 lb (114.8 kg)  BMI (Calculated) 39.62  BLOOD PRESSURE - SYSTOLIC [id number]  BLOOD PRESSURE - DIASTOLIC 56   Body Fat % [id number] %  Total Body Water (lbs) 118.4 lbs    ASK: We discussed the diagnosis of obesity with Gregory [name] today and Gregory [name] agreed to give [place] permission to discuss obesity behavioral modification therapy today.  ASSESS: Gregory [name] has the diagnosis of obesity and his BMI today is 39.62 Gregory [name] is in the action stage of change   ADVISE: Gregory [name] was educated on the multiple health risks of obesity as well as the benefit of weight loss to improve his health. He was advised of the need for long term treatment and the importance of lifestyle modifications to improve his current health and to decrease his risk of future health problems.  AGREE: Multiple dietary modification options and treatment options  were discussed and  Sakib agreed to follow the recommendations documented in the above note.  ARRANGE: Gregory [name] was educated on the importance of frequent visits to treat  obesity as outlined per CMS and USPSTF guidelines and agreed to schedule his next follow up appointment today.  I, Doreene Nest, am acting as transcriptionist for [employer], FNP-C  I have reviewed the above documentation for accuracy and completeness, and I agree with the above.  - Dawn Whitmire, FNP-C.

## 2018-10-29 ENCOUNTER — Encounter (INDEPENDENT_AMBULATORY_CARE_PROVIDER_SITE_OTHER): Payer: Self-pay | Admitting: Family Medicine

## 2018-10-29 DIAGNOSIS — E119 Type 2 diabetes mellitus without complications: Secondary | ICD-10-CM | POA: Insufficient documentation

## 2018-10-31 DIAGNOSIS — H40013 Open angle with borderline findings, low risk, bilateral: Secondary | ICD-10-CM | POA: Diagnosis not present

## 2018-11-11 ENCOUNTER — Ambulatory Visit (INDEPENDENT_AMBULATORY_CARE_PROVIDER_SITE_OTHER): Payer: Medicare HMO | Admitting: Family Medicine

## 2018-11-11 ENCOUNTER — Encounter: Payer: Self-pay | Admitting: Family Medicine

## 2018-11-11 ENCOUNTER — Other Ambulatory Visit: Payer: Self-pay

## 2018-11-11 VITALS — BP 148/60 | HR 58 | Temp 98.0°F | Ht 67.0 in | Wt 256.8 lb

## 2018-11-11 DIAGNOSIS — F418 Other specified anxiety disorders: Secondary | ICD-10-CM | POA: Diagnosis not present

## 2018-11-11 DIAGNOSIS — M545 Low back pain, unspecified: Secondary | ICD-10-CM

## 2018-11-11 DIAGNOSIS — Z23 Encounter for immunization: Secondary | ICD-10-CM | POA: Diagnosis not present

## 2018-11-11 DIAGNOSIS — R69 Illness, unspecified: Secondary | ICD-10-CM | POA: Diagnosis not present

## 2018-11-11 MED ORDER — ALPRAZOLAM 0.5 MG PO TABS: 0.5000 mg | ORAL_TABLET | Freq: Every day | ORAL | 0 refills | Status: DC | PRN

## 2018-11-11 MED ORDER — DICLOFENAC SODIUM 75 MG PO TBEC: 75.0000 mg | DELAYED_RELEASE_TABLET | Freq: Two times a day (BID) | ORAL | 0 refills | Status: DC

## 2018-11-11 NOTE — Addendum Note (Signed)
Addended by: Carter Kitten on: 11/11/2018 09:24 AM   Modules accepted: Orders

## 2018-11-11 NOTE — Patient Instructions (Signed)
Start diclofenac twice daily, heat on low back and  Start home stretching exercise.  Call if not improving in 2-4 weeks for possible physical therapy or further imaging.

## 2018-11-11 NOTE — Assessment & Plan Note (Signed)
Pdmp unremarkable.  Not escalating use... refilled 20 today.

## 2018-11-11 NOTE — Assessment & Plan Note (Signed)
Treat with NSAIDs, heat, and start home PT. If not improving refer to PT and consider X-ray to eval further. Weight loss encouraged.
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
## 2018-11-17 ENCOUNTER — Other Ambulatory Visit: Payer: Self-pay

## 2018-11-17 ENCOUNTER — Ambulatory Visit (INDEPENDENT_AMBULATORY_CARE_PROVIDER_SITE_OTHER): Payer: Medicare HMO | Admitting: Family Medicine

## 2018-11-17 ENCOUNTER — Encounter: Payer: Self-pay | Admitting: Family Medicine

## 2018-11-17 ENCOUNTER — Encounter (INDEPENDENT_AMBULATORY_CARE_PROVIDER_SITE_OTHER): Payer: Self-pay | Admitting: Family Medicine

## 2018-11-17 VITALS — BP 129/70 | HR 56 | Temp 97.5°F | Ht 67.0 in | Wt 253.0 lb

## 2018-11-17 DIAGNOSIS — Z6839 Body mass index (BMI) 39.0-39.9, adult: Secondary | ICD-10-CM | POA: Diagnosis not present

## 2018-11-17 DIAGNOSIS — R7303 Prediabetes: Secondary | ICD-10-CM

## 2018-11-17 DIAGNOSIS — E559 Vitamin D deficiency, unspecified: Secondary | ICD-10-CM | POA: Diagnosis not present

## 2018-11-17 MED ORDER — VITAMIN D (ERGOCALCIFEROL) 1.25 MG (50000 UNIT) PO CAPS: 50000.0000 [IU] | ORAL_CAPSULE | ORAL | 0 refills | Status: DC

## 2018-11-18 ENCOUNTER — Encounter (INDEPENDENT_AMBULATORY_CARE_PROVIDER_SITE_OTHER): Payer: Self-pay | Admitting: Family Medicine

## 2018-11-18 DIAGNOSIS — R7303 Prediabetes: Secondary | ICD-10-CM | POA: Insufficient documentation

## 2018-11-18 NOTE — Progress Notes (Signed)
Office: [phone]  /  Fax: [phone]   HPI:   Chief Complaint: OBESITY Gregory [name] is here to discuss his progress with his obesity treatment plan. He is on the Category 4 plan and is following his eating plan approximately 90 % of the time. He states he is exercising 0 minutes 0 times per week. Gregory [name] has stuck to the plan well overall. He is eating all of the protein on the plan, but he reports that he is not eating all of the snack calories. 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 Gregory [name] Kitchen His weight is 253 lb (114.8 kg) today and he has maintained weight since his last visit. He has lost 14 lbs since starting treatment with [place].  Prediabetes Gregory [name] has a reported diagnosis of diabetes type II which is well controlled. He is not on metformin. Gregory [name] does not check his blood sugars at home. Last A1c was at 5.6 (08/14/18). Patient denies he is diabetic but reports he is prediabetic. He reports his wife told the doctor at one point that he was diabetic and it has been on his chart ever since. The highest A1c I can find in his chart is 5.9. We have records back to 2007.   Vitamin D deficiency Gregory [name] has a diagnosis of vitamin D deficiency. His last vitamin D level was at 36.3 (08/14/18) and was not at goal. He is currently taking vit D. Gregory [name] denies fatigue, nausea, vomiting or muscle weakness.  ASSESSMENT AND PLAN:  Vitamin D deficiency - Plan: Vitamin D, Ergocalciferol, (DRISDOL) 1.25 MG (50000 UT) CAPS capsule, DISCONTINUED: Vitamin D, Ergocalciferol, (DRISDOL) 1.25 MG (50000 UT) CAPS capsule  Prediabetes  Class 2 severe obesity with serious comorbidity and body mass index (BMI) of 39.0 to 39.9 in adult, unspecified obesity type [facility])  PLAN:  Pre-Diabetes Gregory [name] will continue to work on weight loss, exercise, and decreasing simple carbohydrates in his diet to help decrease the risk of diabetes. We dicussed metformin including benefits and risks. He  was informed that eating too many simple carbohydrates or too many calories at one sitting increases the likelihood of GI side effects. Gregory [name] declined metformin for now and a prescription was not written today. Gregory [name] agreed to follow up with [place] as directed to monitor his progress.   Vitamin D Deficiency Gregory [name] was informed that low vitamin D levels contributes to fatigue and are associated with obesity, breast, and colon cancer. Gregory [name] agrees to continue to take prescription Vit D @50 ,000 IU every week #12 with no refills. He requested 90 days. He will follow up for routine testing of vitamin D, at least 2-3 times per year. He was informed of the risk of over-replacement of vitamin D and agrees to not increase his dose unless he discusses this with [place] first. We will check vitamin D level in 1 month and Day agreed to follow up as directed.  Obesity Gregory [name] is currently in the action stage of change. As such, his goal is to continue with weight loss efforts He has agreed to follow the Category 4 plan We discussed the following Behavioral Modification Strategies today: planning for success, increasing lean protein intake and holiday eating strategies   Gregory [name] will eat all of his snack calories.  Gregory [name] has agreed to follow up with our clinic in 3 weeks. He was informed of the importance of frequent follow up visits to maximize his success with intensive lifestyle modifications for his multiple health conditions.  ALLERGIES: No Known Allergies  MEDICATIONS: Current Outpatient Medications on File Prior to Visit  Medication Sig Dispense Refill  . ALPRAZolam (XANAX) 0.5 MG tablet Take 1 tablet (0.5 mg total) by mouth daily as needed for anxiety (30 min prior to plane flight or prn anxiety). 20 tablet 0  . amLODipine (NORVASC) 10 MG tablet Take 1 tablet by mouth once daily 90 tablet 1  . aspirin 81 MG chewable tablet Chew by mouth daily.    Gregory [name] Kitchen atorvastatin (LIPITOR) 40 MG tablet Take 1 tablet by  mouth once daily 90 tablet 2  . diclofenac (VOLTAREN) 75 MG EC tablet Take 1 tablet (75 mg total) by mouth 2 (two) times daily. 30 tablet 0  . Ferrous Sulfate (IRON) 325 (65 Fe) MG TABS Take by mouth.    . furosemide (LASIX) 40 MG tablet Take 1 tablet (40 mg total) by mouth daily. 90 tablet 3  . latanoprost (XALATAN) 0.005 % ophthalmic solution 1 drop at bedtime.    Gregory [name] Kitchen lisinopril (ZESTRIL) 40 MG tablet Take 1 tablet by mouth once daily 90 tablet 1  . Misc Natural Products (OSTEO BI-FLEX/5-LOXIN ADVANCED PO) Take by mouth.    . potassium chloride SA (K-DUR,KLOR-CON) 20 MEQ tablet Take 1 tablet (20 mEq total) by mouth daily. 90 tablet 3   No current facility-administered medications on file prior to visit.     PAST MEDICAL HISTORY: Past Medical History:  Diagnosis Date  . Alcohol abuse   . Anxiety with flying   . Aortic insufficiency   . Drug use   . Edema of both lower extremities   . Glaucoma   . Heart murmur   . Hypertension   . Joint pain   . OSA (obstructive sleep apnea)   . Shingles   . SOB (shortness of breath)     PAST SURGICAL HISTORY: Past Surgical History:  Procedure Laterality Date  . CATARACT EXTRACTION    . None      SOCIAL HISTORY: Social History   Tobacco Use  . Smoking status: Never Smoker  . Smokeless tobacco: Never Used  Substance Use Topics  . Alcohol use: Yes    Alcohol/week: 5.0 standard drinks    Types: 5 Cans of beer per week  . Drug use: No    FAMILY HISTORY: Family History  Adopted: Yes  Problem Relation Age of Onset  . Cancer Mother        breast cancer  . Alcohol abuse Father   . Breast cancer Sister     ROS: Review of Systems  Constitutional: Negative for malaise/fatigue and weight loss.  Gastrointestinal: Negative for nausea and vomiting.  Musculoskeletal:       Negative for muscle weakness  Endo/Heme/Allergies:       Negative for polyphagia    PHYSICAL EXAM: Blood pressure 129/70, pulse (!) 56, temperature (!) 97.5 F  (36.4 C), temperature source Oral, height 5\' 7"  (1.702 m), weight 253 lb (114.8 kg), SpO2 95 %. Body mass index is 39.63 kg/m. Physical Exam Vitals signs reviewed.  Constitutional:      Appearance: Normal appearance. He is well-developed. He is obese.  Cardiovascular:     Rate and Rhythm: Normal rate.  Pulmonary:     Effort: Pulmonary effort is normal.  Musculoskeletal: Normal range of motion.  Skin:    General: Skin is warm and dry.  Neurological:     Mental Status: He is alert and oriented to person, place, and time.  Psychiatric:  Mood and Affect: Mood normal.        Behavior: Behavior normal.     RECENT LABS AND TESTS: BMET    Component Value Date/Time   NA 143 08/04/2018 1052   K 4.9 08/04/2018 1052   CL 102 08/04/2018 1052   CO2 24 08/04/2018 1052   GLUCOSE 94 08/04/2018 1052   GLUCOSE 99 01/14/2018 0752   GLUCOSE 92 10/25/2005 1507   BUN 15 08/04/2018 1052   CREATININE 0.95 08/04/2018 1052   CALCIUM 9.1 08/04/2018 1052   GFRNONAA 81 08/04/2018 1052   GFRAA 94 08/04/2018 1052   Lab Results  Component Value Date   HGBA1C 5.6 08/14/2018   HGBA1C 5.8 01/14/2018   HGBA1C 5.9 12/12/2016   HGBA1C 5.9 04/11/2016   HGBA1C 5.7 01/05/2016   Lab Results  Component Value Date   INSULIN 11.4 08/14/2018   CBC    Component Value Date/Time   WBC 6.8 12/12/2016 1129   RBC 4.12 (L) 12/12/2016 1129   HGB 13.3 12/12/2016 1129   HCT 40.1 12/12/2016 1129   PLT 204.0 12/12/2016 1129   MCV 97.5 12/12/2016 1129   MCHC 33.2 12/12/2016 1129   RDW 13.8 12/12/2016 1129   LYMPHSABS 1.8 12/12/2016 1129   MONOABS 0.8 12/12/2016 1129   EOSABS 0.1 12/12/2016 1129   BASOSABS 0.1 12/12/2016 1129   Iron/TIBC/Ferritin/ %Sat No results found for: IRON, TIBC, FERRITIN, IRONPCTSAT Lipid Panel     Component Value Date/Time   CHOL 161 08/04/2018 1052   TRIG 105 08/04/2018 1052   TRIG 162 (H) 10/25/2005 1507   HDL 66 08/04/2018 1052   CHOLHDL 2.4 08/04/2018 1052    CHOLHDL 3 01/14/2018 0752   VLDL 25.8 01/14/2018 0752   LDLCALC 74 08/04/2018 1052   LDLDIRECT 151.0 01/05/2016 0748   Hepatic Function Panel     Component Value Date/Time   PROT 6.6 08/04/2018 1052   ALBUMIN 4.5 08/04/2018 1052   AST 18 08/04/2018 1052   ALT 16 08/04/2018 1052   ALKPHOS 56 08/04/2018 1052   BILITOT 0.4 08/04/2018 1052   BILIDIR 0.15 08/04/2018 1052      Component Value Date/Time   TSH 2.560 08/14/2018 1209   TSH 4.14 01/11/2015 1111   TSH 2.99 05/18/2013 0847     Ref. Range 08/14/2018 12:09  Vitamin D, 25-Hydroxy Latest Ref Range: 30.0 - 100.0 ng/mL 36.3    OBESITY BEHAVIORAL INTERVENTION VISIT  Today's visit was # 7   Starting weight: 267 lbs Starting date: 08/14/2018 Today's weight : 253 lbs Today's date: 11/17/2018 Total lbs lost to date: 14    11/17/2018  Height 5\' 7"  (1.702 m)  Weight 253 lb (114.8 kg)  BMI (Calculated) 39.62  BLOOD PRESSURE - SYSTOLIC [id number]  BLOOD PRESSURE - DIASTOLIC 70   Body Fat % [id number] %  Total Body Water (lbs) 128.6 lbs    ASK: We discussed the diagnosis of obesity with Gregory [name] today and Gregory [name] agreed to give [place] permission to discuss obesity behavioral modification therapy today.  ASSESS: Gregory [name] has the diagnosis of obesity and his BMI today is 39.62 Gregory [name] is in the action stage of change   ADVISE: Gregory [name] was educated on the multiple health risks of obesity as well as the benefit of weight loss to improve his health. He was advised of the need for long term treatment and the importance of lifestyle modifications to improve his current health and to decrease his risk of future health problems.  AGREE: Multiple dietary modification  options and treatment options were discussed and  Shayde agreed to follow the recommendations documented in the above note.  ARRANGE: Gregory [name] was educated on the importance of frequent visits to treat obesity as outlined per CMS and USPSTF guidelines and agreed to schedule his  next follow up appointment today.  Corey Skains, am acting as Location manager for [employer], FNP-C.  I have reviewed the above documentation for accuracy and completeness, and I agree with the above.  - Boniface Goffe, FNP-C.

## 2018-11-21 ENCOUNTER — Ambulatory Visit: Payer: Medicare HMO | Admitting: Family Medicine

## 2018-11-24 ENCOUNTER — Ambulatory Visit (INDEPENDENT_AMBULATORY_CARE_PROVIDER_SITE_OTHER): Payer: Medicare HMO | Admitting: Primary Care

## 2018-11-24 ENCOUNTER — Encounter: Payer: Self-pay | Admitting: Primary Care

## 2018-11-24 DIAGNOSIS — U071 COVID-19: Secondary | ICD-10-CM | POA: Diagnosis not present

## 2018-11-24 DIAGNOSIS — Z20828 Contact with and (suspected) exposure to other viral communicable diseases: Secondary | ICD-10-CM

## 2018-11-24 DIAGNOSIS — Z20822 Contact with and (suspected) exposure to covid-19: Secondary | ICD-10-CM | POA: Insufficient documentation

## 2018-11-24 NOTE — Assessment & Plan Note (Signed)
Exposed to Covid-19 all last week, he has not developed symptoms. He appears very well today. Discussed that it's reasonable to remain home without testing as he doesn't have symptoms and is feeling well, but he does already have an appointment this evening. Discussed to quarantine until results return.

## 2018-11-24 NOTE — Progress Notes (Signed)
Subjective:    Patient ID: Gregory [name], male    DOB: Feb 26, 1950, 68 y.o.   MRN: [id number]  HPI  Virtual Visit via Video Note  I connected with Gregory [name] on 11/24/18 at  9:20 AM EST by a video enabled telemedicine application and verified that I am speaking with the correct person using two identifiers.  Location: Patient: Home Provider: Office   I discussed the limitations of evaluation and management by telemedicine and the availability of in person appointments. The patient expressed understanding and agreed to proceed.  History of Present Illness:  Gregory [name] is a 68 year old male with a history of hypertension, anemia, anxiety, obesity, prediabetes who presents today with reports of Covid-19 exposure.  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 The friend had a fever and cough and five days ago tested positive for Covid-19. He did go to the gas station once before he knew about the exposure, wore a mask. He denies cough, fevers, loss of taste/smell, shortness of breath, body aches, etc. He's feeling great overall. His wife has no symptoms.  He has an appointment with the health department this evening for Covid-19 testing.    Observations/Objective:  Alert and oriented. Appears well, not sickly. No distress. Speaking in complete sentences. No cough.   Assessment and Plan:  Exposed to Covid-19 all last week, he has not developed symptoms. He appears very well today. Discussed that it's reasonable to remain home without testing as he doesn't have symptoms and is feeling well, but he does already have an appointment this evening. Discussed to quarantine until results return.  Follow Up Instructions:  Please update [place] with your Covid-19 test result.  Remain at home until notified of your results.   It was a pleasure meeting you! Allie Bossier, NP-C    I discussed the  assessment and treatment plan with the patient. The patient was provided an opportunity to ask questions and all were answered. The patient agreed with the plan and demonstrated an understanding of the instructions.   The patient was advised to call back or seek an in-person evaluation if the symptoms worsen or if the condition fails to improve as anticipated.    Pleas Koch, NP    Review of Systems  Constitutional: Negative for chills, fatigue and fever.  HENT: Negative for congestion and sore throat.   Respiratory: Negative for cough and shortness of breath.   Cardiovascular: Negative for chest pain.  Gastrointestinal: Negative for diarrhea.       Past Medical History:  Diagnosis Date  . Alcohol abuse   . Anxiety with flying   . Aortic insufficiency   . Drug use   . Edema of both lower extremities   . Glaucoma   . Heart murmur   . Hypertension   . Joint pain   . OSA (obstructive sleep apnea)   . Shingles   . SOB (shortness of breath)      Social History   Socioeconomic History  . Marital status: Married    Spouse name: Cindi  . Number of children: 3  . Years of education: Not on file  . Highest education level: Not on file  Occupational History  . Occupation: Barista  . Financial resource strain: Not on file  . Food insecurity    Worry: Not on file  Inability: Not on file  . Transportation needs    Medical: Not on file    Non-medical: Not on file  Tobacco Use  . Smoking status: Never Smoker  . Smokeless tobacco: Never Used  Substance and Sexual Activity  . Alcohol use: Yes    Alcohol/week: 5.0 standard drinks    Types: 5 Cans of beer per week  . Drug use: No  . Sexual activity: Not on file  Lifestyle  . Physical activity    Days per week: Not on file    Minutes per session: Not on file  . Stress: Not on file  Relationships  . Social Herbalist on phone: Not on file    Gets together: Not on file    Attends  religious service: Not on file    Active member of club or organization: Not on file    Attends meetings of clubs or organizations: Not on file    Relationship status: Not on file  . Intimate partner violence    Fear of current or ex partner: Not on file    Emotionally abused: Not on file    Physically abused: Not on file    Forced sexual activity: Not on file  Other Topics Concern  . Not on file  Social History Narrative   Lives at home with wife.    Past Surgical History:  Procedure Laterality Date  . CATARACT EXTRACTION    . None      Family History  Adopted: Yes  Problem Relation Age of Onset  . Cancer Mother        breast cancer  . Alcohol abuse Father   . Breast cancer Sister     No Known Allergies  Current Outpatient Medications on File Prior to Visit  Medication Sig Dispense Refill  . ALPRAZolam (XANAX) 0.5 MG tablet Take 1 tablet (0.5 mg total) by mouth daily as needed for anxiety (30 min prior to plane flight or prn anxiety). 20 tablet 0  . amLODipine (NORVASC) 10 MG tablet Take 1 tablet by mouth once daily 90 tablet 1  . aspirin 81 MG chewable tablet Chew by mouth daily.    [name] atorvastatin (LIPITOR) 40 MG tablet Take 1 tablet by mouth once daily 90 tablet 2  . diclofenac (VOLTAREN) 75 MG EC tablet Take 1 tablet (75 mg total) by mouth 2 (two) times daily. 30 tablet 0  . Ferrous Sulfate (IRON) 325 (65 Fe) MG TABS Take by mouth.    . furosemide (LASIX) 40 MG tablet Take 1 tablet (40 mg total) by mouth daily. 90 tablet 3  . latanoprost (XALATAN) 0.005 % ophthalmic solution 1 drop at bedtime.    [name] lisinopril (ZESTRIL) 40 MG tablet Take 1 tablet by mouth once daily 90 tablet 1  . Misc Natural Products (OSTEO BI-FLEX/5-LOXIN ADVANCED PO) Take by mouth.    . potassium chloride SA (K-DUR,KLOR-CON) 20 MEQ tablet Take 1 tablet (20 mEq total) by mouth daily. 90 tablet 3  . Vitamin D, Ergocalciferol, (DRISDOL) 1.25 MG (50000 UT) CAPS capsule Take 1 capsule (50,000 Units  total) by mouth every 7 (seven) days. 12 capsule 0   No current facility-administered medications on file prior to visit.     There were no vitals taken for this visit.   Objective:   Physical Exam  Constitutional: He is oriented to person, place, and time. He appears well-nourished.  Respiratory: Effort normal. No respiratory distress.  No cough   Neurological: He  is alert and oriented to person, place, and time.  Psychiatric: He has a normal mood and affect.           Assessment & Plan:

## 2018-11-24 NOTE — Patient Instructions (Signed)
Please update [place] with your Covid-19 test result.  Remain at home until notified of your results.   It was a pleasure meeting you! Allie Bossier, NP-C

## 2018-11-25 ENCOUNTER — Other Ambulatory Visit: Payer: Self-pay | Admitting: Family Medicine

## 2018-11-25 NOTE — Telephone Encounter (Signed)
Last office visit 11/24/2018 with [name] for Covid Exposure.  Last refilled 11/11/2018 for #30 with no refills.  No future appointments with PCP.

## 2018-12-04 ENCOUNTER — Telehealth: Payer: Self-pay | Admitting: Family Medicine

## 2018-12-04 NOTE — Telephone Encounter (Signed)
Spoke with spouse (cindy) she wanted to know when pt could go get another covid test so he can to back to work  He had a covid test 2 weeks from Monday

## 2018-12-04 NOTE — Telephone Encounter (Signed)
No retesting needed.  CDC recs:   If your COVID test is POSITIVE you may return to work/school  if all of the following are true: 1.  10 days since symptom onset or positive COVID-19 test 2.    3 consecutive days without fever and without antipyretics 3.    You have symptom improvement [especially respiratory])

## 2018-12-05 NOTE — Telephone Encounter (Signed)
Cindy notified as instructed by telephone.

## 2018-12-08 ENCOUNTER — Ambulatory Visit (INDEPENDENT_AMBULATORY_CARE_PROVIDER_SITE_OTHER): Payer: Medicare HMO | Admitting: Family Medicine

## 2018-12-12 ENCOUNTER — Other Ambulatory Visit: Payer: Self-pay | Admitting: Family Medicine

## 2018-12-12 NOTE — Telephone Encounter (Signed)
Last office visit 11/24/2018 with [name] for Covid Exposure.  Last refilled 11/25/2018 for #30 with no refills.  No future appointments.

## 2018-12-29 ENCOUNTER — Other Ambulatory Visit: Payer: Self-pay | Admitting: Family Medicine

## 2018-12-29 NOTE — Telephone Encounter (Signed)
Last office visit 11/24/2018 with [name] for Covid Exposure.  Last refilled 12/12/2018 for #30 with no refills.  No future appointments.

## 2019-01-06 ENCOUNTER — Other Ambulatory Visit: Payer: Self-pay | Admitting: Family Medicine

## 2019-01-19 ENCOUNTER — Other Ambulatory Visit: Payer: Self-pay | Admitting: Family Medicine

## 2019-01-19 NOTE — Telephone Encounter (Signed)
Last office 11/24/2018 with [name] with exposure to Covid-19. Last refilled 12/28/2020f or #30 with no refills.  No future appointments.

## 2019-01-23 ENCOUNTER — Ambulatory Visit (INDEPENDENT_AMBULATORY_CARE_PROVIDER_SITE_OTHER): Payer: Medicare HMO | Admitting: Family Medicine

## 2019-01-23 ENCOUNTER — Encounter: Payer: Self-pay | Admitting: Family Medicine

## 2019-01-23 VITALS — Temp 96.4°F | Ht 68.4 in

## 2019-01-23 DIAGNOSIS — M545 Low back pain, unspecified: Secondary | ICD-10-CM

## 2019-01-23 MED ORDER — CYCLOBENZAPRINE HCL 10 MG PO TABS: 10.0000 mg | ORAL_TABLET | Freq: Every evening | ORAL | 0 refills | Status: DC | PRN

## 2019-01-23 NOTE — Assessment & Plan Note (Signed)
Add muscle relaxant in addition to diclofenac BID. Continue heat and start home PT.  if not improving he will need X-ray , in office eval and likely formal PT.  No red flags.

## 2019-01-23 NOTE — Progress Notes (Signed)
VIRTUAL VISIT Due to national recommendations of social distancing due to [place] 19, a virtual visit is felt to be most appropriate for this patient at this time.   I connected with the patient on 01/23/19 at  2:20 PM EST by virtual telehealth platform and verified that I am speaking with the correct person using two identifiers.   I discussed the limitations, risks, security and privacy concerns of performing an evaluation and management service by  virtual telehealth platform and the availability of in person appointments. I also discussed with the patient that there may be a patient responsible charge related to this service. The patient expressed understanding and agreed to proceed.  Patient location: Home Provider Location: [facility] Participants: [name] and [name]   Chief Complaint  Patient presents with  . Spasms    Back    History of Present Illness:  69 year old male with history of  Back pain presents with flare of low back muscle spasms.  Seen in 11/2018 with right sided low back pain without sciatica.[name] treated with  Diclofenac, home PT and heat.  Symptoms improved but did not resolve.   He reports that is has returned in last week to left. More sore  Had sudden worsening and tightening left lower back.. sharp pain yesterday when adjusting position.  Cannot stand when it occurs.  no new loss of urine, no fever, no numbness or weakness.   Fell  7-10 days ago  Going up stairs, foot caught.. hit right shin   No radiation of pain.   He has lymphedema and slow resolution of scratch on legs.    He is still taking diclofenac.. no SE.  Using heat and bengay.  Per wife he is not ttp over vertebrae.   COVID 19 screen No recent travel or known exposure to COVID19 The patient denies respiratory symptoms of COVID 19 at this time.  The importance of social distancing was discussed today.   Review of Systems  Constitutional: Negative for chills and  fever.  HENT: Negative for congestion and ear pain.   Eyes: Negative for pain and redness.  Respiratory: Negative for cough and shortness of breath.   Cardiovascular: Negative for chest pain, palpitations and leg swelling.  Gastrointestinal: Negative for abdominal pain, blood in stool, constipation, diarrhea, nausea and vomiting.  Genitourinary: Negative for dysuria.  Musculoskeletal: Negative for falls and myalgias.  Skin: Negative for rash.  Neurological: Negative for dizziness.  Psychiatric/Behavioral: Negative for depression. The patient is not nervous/anxious.       Past Medical History:  Diagnosis Date  . Alcohol abuse   . Anxiety with flying   . Aortic insufficiency   . Drug use   . Edema of both lower extremities   . Glaucoma   . Heart murmur   . Hypertension   . Joint pain   . OSA (obstructive sleep apnea)   . Shingles   . SOB (shortness of breath)     reports that he has never smoked. He has never used smokeless tobacco. He reports current alcohol use of about 5.0 standard drinks of alcohol per week. He reports that he does not use drugs.   Current Outpatient Medications:  .  ALPRAZolam (XANAX) 0.5 MG tablet, Take 1 tablet (0.5 mg total) by mouth daily as needed for anxiety (30 min prior to plane flight or prn anxiety)., Disp: 20 tablet, Rfl: 0 .  amLODipine (NORVASC) 10 MG tablet, Take 1 tablet by mouth once  daily, Disp: 90 tablet, Rfl: 1 .  aspirin 81 MG chewable tablet, Chew by mouth daily., Disp: , Rfl:  .  atorvastatin (LIPITOR) 40 MG tablet, Take 1 tablet by mouth once daily, Disp: 90 tablet, Rfl: 2 .  diclofenac (VOLTAREN) 75 MG EC tablet, Take 1 tablet by mouth twice daily, Disp: 30 tablet, Rfl: 0 .  Ferrous Sulfate (IRON) 325 (65 Fe) MG TABS, Take by mouth., Disp: , Rfl:  .  furosemide (LASIX) 40 MG tablet, Take 1 tablet (40 mg total) by mouth daily., Disp: 90 tablet, Rfl: 3 .  latanoprost (XALATAN) 0.005 % ophthalmic solution, 1 drop at bedtime., Disp: , Rfl:   .  lisinopril (ZESTRIL) 40 MG tablet, Take 1 tablet by mouth once daily, Disp: 90 tablet, Rfl: 1 .  Misc Natural Products (OSTEO BI-FLEX/5-LOXIN ADVANCED PO), Take by mouth., Disp: , Rfl:  .  potassium chloride SA (KLOR-CON) 20 MEQ tablet, Take 1 tablet by mouth once daily, Disp: 90 tablet, Rfl: 1 .  Vitamin D, Ergocalciferol, (DRISDOL) 1.25 MG (50000 UT) CAPS capsule, Take 1 capsule (50,000 Units total) by mouth every 7 (seven) days., Disp: 12 capsule, Rfl: 0   Observations/Objective: Temperature (!) 96.4 F (35.8 C), temperature source Temporal, height 5' 8.4" (1.737 m).  Physical Exam  Physical Exam Constitutional:      General: The patient is not in acute distress. Pulmonary:     Effort: Pulmonary effort is normal. No respiratory distress.  Neurological:     Mental Status: The patient is alert and oriented to person, place, and time.  Psychiatric:        Mood and Affect: Mood normal.        Behavior: Behavior normal.   Assessment and Plan Acute left-sided low back pain without sciatica Add muscle relaxant in addition to diclofenac BID. Continue heat and start home PT.  if not improving he will need X-ray , in office eval and likely formal PT.  No red flags.     I discussed the assessment and treatment plan with the patient. The patient was provided an opportunity to ask questions and all were answered. The patient agreed with the plan and demonstrated an understanding of the instructions.   The patient was advised to call back or seek an in-person evaluation if the symptoms worsen or if the condition fails to improve as anticipated.     [name], MD

## 2019-01-23 NOTE — Patient Instructions (Signed)
Start cyclobenzaprine at night.  Continue diclofenac twice daily.  Continue home low back stretches.  Continue heat.  If not improving 2 weeks.. make appt for in office eval and likely X-ray.

## 2019-01-26 ENCOUNTER — Other Ambulatory Visit: Payer: Self-pay | Admitting: Family Medicine

## 2019-01-28 ENCOUNTER — Telehealth: Payer: Self-pay | Admitting: *Deleted

## 2019-01-28 NOTE — Telephone Encounter (Signed)
Patient's wife called stating that he takes Diclofenac for back pain. Patient's wife stated that he took the covid vaccine yesterday and has a bad headache today. Patient's wife stated that the diclofenac is not helping with his headache. Jenny Reichmann wants to know if her husband can take tylenol for the headache since he takes Diclofenac?

## 2019-01-28 NOTE — Telephone Encounter (Signed)
yes

## 2019-01-28 NOTE — Telephone Encounter (Addendum)
Left message for Gregory [name] that he can take Tylenol for his headache.  It is safe to take with the Diclofenac per Dr. Lorelei Pont.

## 2019-01-30 DIAGNOSIS — H5213 Myopia, bilateral: Secondary | ICD-10-CM | POA: Diagnosis not present

## 2019-02-02 ENCOUNTER — Other Ambulatory Visit: Payer: Self-pay | Admitting: Family Medicine

## 2019-02-02 DIAGNOSIS — I10 Essential (primary) hypertension: Secondary | ICD-10-CM

## 2019-02-10 ENCOUNTER — Other Ambulatory Visit: Payer: Self-pay | Admitting: Family Medicine

## 2019-02-10 NOTE — Telephone Encounter (Signed)
Last office visit 01/23/2019 for back pain.  Last refilled 01/19/2019 for #30 with no refills.  No future appointments.

## 2019-03-02 DIAGNOSIS — R079 Chest pain, unspecified: Secondary | ICD-10-CM

## 2019-03-02 HISTORY — DX: Chest pain, unspecified: R07.9

## 2019-03-03 ENCOUNTER — Inpatient Hospital Stay (HOSPITAL_COMMUNITY)
Admission: EM | Admit: 2019-03-03 | Discharge: 2019-03-20 | DRG: 217 | Disposition: A | Discharge status: Home Health | Payer: Medicare HMO | Attending: Cardiothoracic Surgery | Admitting: Cardiothoracic Surgery

## 2019-03-03 ENCOUNTER — Emergency Department (HOSPITAL_COMMUNITY): Payer: Medicare HMO

## 2019-03-03 ENCOUNTER — Other Ambulatory Visit: Payer: Self-pay

## 2019-03-03 ENCOUNTER — Encounter (HOSPITAL_COMMUNITY): Payer: Self-pay | Admitting: Emergency Medicine

## 2019-03-03 DIAGNOSIS — Z4682 Encounter for fitting and adjustment of non-vascular catheter: Secondary | ICD-10-CM | POA: Diagnosis not present

## 2019-03-03 DIAGNOSIS — E785 Hyperlipidemia, unspecified: Secondary | ICD-10-CM | POA: Diagnosis not present

## 2019-03-03 DIAGNOSIS — M1710 Unilateral primary osteoarthritis, unspecified knee: Secondary | ICD-10-CM | POA: Diagnosis present

## 2019-03-03 DIAGNOSIS — Z01818 Encounter for other preprocedural examination: Secondary | ICD-10-CM | POA: Diagnosis not present

## 2019-03-03 DIAGNOSIS — H409 Unspecified glaucoma: Secondary | ICD-10-CM | POA: Diagnosis present

## 2019-03-03 DIAGNOSIS — I25119 Atherosclerotic heart disease of native coronary artery with unspecified angina pectoris: Secondary | ICD-10-CM | POA: Diagnosis present

## 2019-03-03 DIAGNOSIS — I5032 Chronic diastolic (congestive) heart failure: Secondary | ICD-10-CM | POA: Diagnosis present

## 2019-03-03 DIAGNOSIS — Z01810 Encounter for preprocedural cardiovascular examination: Secondary | ICD-10-CM | POA: Diagnosis not present

## 2019-03-03 DIAGNOSIS — R079 Chest pain, unspecified: Secondary | ICD-10-CM

## 2019-03-03 DIAGNOSIS — F1020 Alcohol dependence, uncomplicated: Secondary | ICD-10-CM | POA: Diagnosis present

## 2019-03-03 DIAGNOSIS — Z888 Allergy status to other drugs, medicaments and biological substances status: Secondary | ICD-10-CM

## 2019-03-03 DIAGNOSIS — Z419 Encounter for procedure for purposes other than remedying health state, unspecified: Secondary | ICD-10-CM

## 2019-03-03 DIAGNOSIS — I77810 Thoracic aortic ectasia: Secondary | ICD-10-CM | POA: Diagnosis present

## 2019-03-03 DIAGNOSIS — Z7982 Long term (current) use of aspirin: Secondary | ICD-10-CM

## 2019-03-03 DIAGNOSIS — D7582 Heparin induced thrombocytopenia (HIT): Secondary | ICD-10-CM | POA: Diagnosis not present

## 2019-03-03 DIAGNOSIS — Z79899 Other long term (current) drug therapy: Secondary | ICD-10-CM

## 2019-03-03 DIAGNOSIS — K029 Dental caries, unspecified: Secondary | ICD-10-CM | POA: Diagnosis present

## 2019-03-03 DIAGNOSIS — I38 Endocarditis, valve unspecified: Secondary | ICD-10-CM | POA: Diagnosis not present

## 2019-03-03 DIAGNOSIS — I2510 Atherosclerotic heart disease of native coronary artery without angina pectoris: Secondary | ICD-10-CM | POA: Diagnosis not present

## 2019-03-03 DIAGNOSIS — J479 Bronchiectasis, uncomplicated: Secondary | ICD-10-CM | POA: Diagnosis not present

## 2019-03-03 DIAGNOSIS — I440 Atrioventricular block, first degree: Secondary | ICD-10-CM | POA: Diagnosis not present

## 2019-03-03 DIAGNOSIS — J90 Pleural effusion, not elsewhere classified: Secondary | ICD-10-CM

## 2019-03-03 DIAGNOSIS — I252 Old myocardial infarction: Secondary | ICD-10-CM | POA: Diagnosis not present

## 2019-03-03 DIAGNOSIS — Z8616 Personal history of COVID-19: Secondary | ICD-10-CM | POA: Diagnosis not present

## 2019-03-03 DIAGNOSIS — I214 Non-ST elevation (NSTEMI) myocardial infarction: Principal | ICD-10-CM

## 2019-03-03 DIAGNOSIS — K011 Impacted teeth: Secondary | ICD-10-CM | POA: Diagnosis present

## 2019-03-03 DIAGNOSIS — I110 Hypertensive heart disease with heart failure: Secondary | ICD-10-CM | POA: Diagnosis not present

## 2019-03-03 DIAGNOSIS — F419 Anxiety disorder, unspecified: Secondary | ICD-10-CM | POA: Diagnosis present

## 2019-03-03 DIAGNOSIS — I10 Essential (primary) hypertension: Secondary | ICD-10-CM | POA: Diagnosis not present

## 2019-03-03 DIAGNOSIS — R0789 Other chest pain: Secondary | ICD-10-CM | POA: Diagnosis not present

## 2019-03-03 DIAGNOSIS — K5900 Constipation, unspecified: Secondary | ICD-10-CM | POA: Diagnosis present

## 2019-03-03 DIAGNOSIS — I890 Lymphedema, not elsewhere classified: Secondary | ICD-10-CM | POA: Diagnosis present

## 2019-03-03 DIAGNOSIS — I255 Ischemic cardiomyopathy: Secondary | ICD-10-CM | POA: Diagnosis present

## 2019-03-03 DIAGNOSIS — K0530 Chronic periodontitis, unspecified: Secondary | ICD-10-CM | POA: Diagnosis present

## 2019-03-03 DIAGNOSIS — J9811 Atelectasis: Secondary | ICD-10-CM | POA: Diagnosis not present

## 2019-03-03 DIAGNOSIS — R0602 Shortness of breath: Secondary | ICD-10-CM

## 2019-03-03 DIAGNOSIS — I352 Nonrheumatic aortic (valve) stenosis with insufficiency: Secondary | ICD-10-CM | POA: Diagnosis present

## 2019-03-03 DIAGNOSIS — R0902 Hypoxemia: Secondary | ICD-10-CM | POA: Diagnosis not present

## 2019-03-03 DIAGNOSIS — G4733 Obstructive sleep apnea (adult) (pediatric): Secondary | ICD-10-CM | POA: Diagnosis present

## 2019-03-03 DIAGNOSIS — Z01811 Encounter for preprocedural respiratory examination: Secondary | ICD-10-CM | POA: Diagnosis not present

## 2019-03-03 DIAGNOSIS — Z803 Family history of malignant neoplasm of breast: Secondary | ICD-10-CM

## 2019-03-03 DIAGNOSIS — I350 Nonrheumatic aortic (valve) stenosis: Secondary | ICD-10-CM | POA: Diagnosis not present

## 2019-03-03 DIAGNOSIS — D62 Acute posthemorrhagic anemia: Secondary | ICD-10-CM | POA: Diagnosis not present

## 2019-03-03 DIAGNOSIS — Z952 Presence of prosthetic heart valve: Secondary | ICD-10-CM

## 2019-03-03 DIAGNOSIS — I4891 Unspecified atrial fibrillation: Secondary | ICD-10-CM | POA: Diagnosis not present

## 2019-03-03 DIAGNOSIS — R918 Other nonspecific abnormal finding of lung field: Secondary | ICD-10-CM | POA: Diagnosis not present

## 2019-03-03 DIAGNOSIS — I25110 Atherosclerotic heart disease of native coronary artery with unstable angina pectoris: Secondary | ICD-10-CM | POA: Diagnosis not present

## 2019-03-03 DIAGNOSIS — M549 Dorsalgia, unspecified: Secondary | ICD-10-CM | POA: Diagnosis present

## 2019-03-03 DIAGNOSIS — K30 Functional dyspepsia: Secondary | ICD-10-CM | POA: Diagnosis present

## 2019-03-03 DIAGNOSIS — E8881 Metabolic syndrome: Secondary | ICD-10-CM | POA: Diagnosis present

## 2019-03-03 DIAGNOSIS — M264 Malocclusion, unspecified: Secondary | ICD-10-CM | POA: Diagnosis present

## 2019-03-03 DIAGNOSIS — I959 Hypotension, unspecified: Secondary | ICD-10-CM | POA: Diagnosis not present

## 2019-03-03 DIAGNOSIS — I358 Other nonrheumatic aortic valve disorders: Secondary | ICD-10-CM | POA: Diagnosis present

## 2019-03-03 DIAGNOSIS — Z6839 Body mass index (BMI) 39.0-39.9, adult: Secondary | ICD-10-CM

## 2019-03-03 DIAGNOSIS — G8929 Other chronic pain: Secondary | ICD-10-CM | POA: Diagnosis present

## 2019-03-03 DIAGNOSIS — Z20822 Contact with and (suspected) exposure to covid-19: Secondary | ICD-10-CM | POA: Diagnosis not present

## 2019-03-03 DIAGNOSIS — Z9889 Other specified postprocedural states: Secondary | ICD-10-CM

## 2019-03-03 DIAGNOSIS — Z951 Presence of aortocoronary bypass graft: Secondary | ICD-10-CM

## 2019-03-03 HISTORY — DX: Nonrheumatic aortic valve disorder, unspecified: I35.9

## 2019-03-03 LAB — CBC
HCT: 35.3 % — ABNORMAL LOW (ref 39.0–52.0)
Hemoglobin: 11.4 g/dL — ABNORMAL LOW (ref 13.0–17.0)
MCH: 30.2 pg (ref 26.0–34.0)
MCHC: 32.3 g/dL (ref 30.0–36.0)
MCV: 93.4 fL (ref 80.0–100.0)
Platelets: 227 10*3/uL (ref 150–400)
RBC: 3.78 MIL/uL — ABNORMAL LOW (ref 4.22–5.81)
RDW: 13.3 % (ref 11.5–15.5)
WBC: 8.4 10*3/uL (ref 4.0–10.5)
nRBC: 0 % (ref 0.0–0.2)

## 2019-03-03 LAB — BASIC METABOLIC PANEL
Anion gap: 9 (ref 5–15)
BUN: 18 mg/dL (ref 8–23)
CO2: 24 mmol/L (ref 22–32)
Calcium: 8.7 mg/dL — ABNORMAL LOW (ref 8.9–10.3)
Chloride: 106 mmol/L (ref 98–111)
Creatinine, Ser: 0.88 mg/dL (ref 0.61–1.24)
GFR calc Af Amer: >60 mL/min (ref >60)
GFR calc non Af Amer: >60 mL/min (ref >60)
Glucose, Bld: 114 mg/dL — ABNORMAL HIGH (ref 70–99)
Potassium: 3.8 mmol/L (ref 3.5–5.1)
Sodium: 139 mmol/L (ref 135–145)

## 2019-03-03 LAB — TROPONIN I (HIGH SENSITIVITY): Troponin I (High Sensitivity): 741 ng/L (ref <18)

## 2019-03-03 LAB — PROTIME-INR
INR: 1.1 (ref 0.8–1.2)
Prothrombin Time: 14 seconds (ref 11.4–15.2)

## 2019-03-03 MED ORDER — SODIUM CHLORIDE 0.9% FLUSH
3.0000 mL | Freq: Once | INTRAVENOUS | Status: AC
  Administered 2019-03-03: 3 mL via INTRAVENOUS

## 2019-03-03 NOTE — ED Triage Notes (Signed)
Patient arrived with EMS from home reports central chest pain this evening radiating to left arm , denies SOB , mild nausea /no diaphoresis , chest pain resolved at arrival , he received 1 NTG sl and ASA 324 mg prior to arrival .

## 2019-03-03 NOTE — Progress Notes (Signed)
ANTICOAGULATION CONSULT NOTE - Initial Consult  Pharmacy Consult for Heparin Indication: chest pain/ACS  No Known Allergies  Patient Measurements: Height: 5\' 6"  (167.6 cm) Weight: 264 lb 8.8 oz (120 kg) IBW/kg (Calculated) : 63.8 Heparin Dosing Weight: 90 kg  Vital Signs: Temp: 97.7 F (36.5 C) (03/02 2119) Temp Source: Oral (03/02 2119) BP: 141/57 (03/02 2119) Pulse Rate: 83 (03/02 2119)  Labs: Recent Labs    03/03/19 2113  HGB 11.4*  HCT 35.3*  PLT 227  LABPROT 14.0  INR 1.1  CREATININE 0.88  TROPONINIHS 741*    Estimated Creatinine Clearance: 95.3 mL/min (by C-G formula based on SCr of 0.88 mg/dL).   Medical History: Past Medical History:  Diagnosis Date  . Alcohol abuse   . Anxiety with flying   . Aortic insufficiency   . Drug use   . Edema of both lower extremities   . Glaucoma   . Heart murmur   . Hypertension   . Joint pain   . OSA (obstructive sleep apnea)   . Shingles   . SOB (shortness of breath)     Medications:  No current facility-administered medications on file prior to encounter.   Current Outpatient Medications on File Prior to Encounter  Medication Sig Dispense Refill  . ALPRAZolam (XANAX) 0.5 MG tablet Take 1 tablet (0.5 mg total) by mouth daily as needed for anxiety (30 min prior to plane flight or prn anxiety). 20 tablet 0  . amLODipine (NORVASC) 10 MG tablet Take 1 tablet by mouth once daily 90 tablet 0  . aspirin 81 MG chewable tablet Chew by mouth daily.    [name] atorvastatin (LIPITOR) 40 MG tablet Take 1 tablet by mouth once daily 90 tablet 2  . cyclobenzaprine (FLEXERIL) 10 MG tablet Take 1 tablet (10 mg total) by mouth at bedtime as needed for muscle spasms. 15 tablet 0  . diclofenac (VOLTAREN) 75 MG EC tablet Take 1 tablet by mouth twice daily 30 tablet 0  . Ferrous Sulfate (IRON) 325 (65 Fe) MG TABS Take by mouth.    . furosemide (LASIX) 40 MG tablet Take 1 tablet by mouth once daily 90 tablet 0  . latanoprost (XALATAN) 0.005  % ophthalmic solution 1 drop at bedtime.    [name] lisinopril (ZESTRIL) 40 MG tablet Take 1 tablet by mouth once daily 90 tablet 0  . Misc Natural Products (OSTEO BI-FLEX/5-LOXIN ADVANCED PO) Take by mouth.    . potassium chloride SA (KLOR-CON) 20 MEQ tablet Take 1 tablet by mouth once daily 90 tablet 1  . Vitamin D, Ergocalciferol, (DRISDOL) 1.25 MG (50000 UT) CAPS capsule Take 1 capsule (50,000 Units total) by mouth every 7 (seven) days. 12 capsule 0     Assessment: 69 y.o. male with chest pain for heparin Goal of Therapy:  Heparin level 0.3-0.7 units/ml Monitor platelets by anticoagulation protocol: Yes   Plan:  Heparin 4000 units IV bolus, then start heparin 1450 units/hr Check heparin level in 6 hours.   Caryl Pina 03/03/2019,11:53 PM

## 2019-03-04 ENCOUNTER — Encounter (HOSPITAL_COMMUNITY)
Admission: EM | Disposition: A | Discharge status: Home Health | Payer: Self-pay | Source: Home / Self Care | Attending: Cardiothoracic Surgery

## 2019-03-04 ENCOUNTER — Inpatient Hospital Stay (HOSPITAL_COMMUNITY): Payer: Medicare HMO

## 2019-03-04 ENCOUNTER — Encounter (HOSPITAL_COMMUNITY): Payer: Self-pay | Admitting: Cardiovascular Disease

## 2019-03-04 DIAGNOSIS — R0602 Shortness of breath: Secondary | ICD-10-CM | POA: Diagnosis not present

## 2019-03-04 DIAGNOSIS — I890 Lymphedema, not elsewhere classified: Secondary | ICD-10-CM | POA: Diagnosis present

## 2019-03-04 DIAGNOSIS — R918 Other nonspecific abnormal finding of lung field: Secondary | ICD-10-CM | POA: Diagnosis not present

## 2019-03-04 DIAGNOSIS — I2510 Atherosclerotic heart disease of native coronary artery without angina pectoris: Secondary | ICD-10-CM | POA: Diagnosis not present

## 2019-03-04 DIAGNOSIS — G4733 Obstructive sleep apnea (adult) (pediatric): Secondary | ICD-10-CM | POA: Diagnosis present

## 2019-03-04 DIAGNOSIS — I25119 Atherosclerotic heart disease of native coronary artery with unspecified angina pectoris: Secondary | ICD-10-CM | POA: Diagnosis present

## 2019-03-04 DIAGNOSIS — J9811 Atelectasis: Secondary | ICD-10-CM | POA: Diagnosis not present

## 2019-03-04 DIAGNOSIS — I214 Non-ST elevation (NSTEMI) myocardial infarction: Secondary | ICD-10-CM | POA: Diagnosis not present

## 2019-03-04 DIAGNOSIS — I77810 Thoracic aortic ectasia: Secondary | ICD-10-CM | POA: Diagnosis present

## 2019-03-04 DIAGNOSIS — I350 Nonrheumatic aortic (valve) stenosis: Secondary | ICD-10-CM | POA: Diagnosis not present

## 2019-03-04 DIAGNOSIS — I5032 Chronic diastolic (congestive) heart failure: Secondary | ICD-10-CM | POA: Diagnosis not present

## 2019-03-04 DIAGNOSIS — F1020 Alcohol dependence, uncomplicated: Secondary | ICD-10-CM | POA: Diagnosis present

## 2019-03-04 DIAGNOSIS — I255 Ischemic cardiomyopathy: Secondary | ICD-10-CM | POA: Diagnosis present

## 2019-03-04 DIAGNOSIS — K029 Dental caries, unspecified: Secondary | ICD-10-CM | POA: Diagnosis present

## 2019-03-04 DIAGNOSIS — M1710 Unilateral primary osteoarthritis, unspecified knee: Secondary | ICD-10-CM | POA: Diagnosis present

## 2019-03-04 DIAGNOSIS — K5900 Constipation, unspecified: Secondary | ICD-10-CM | POA: Diagnosis present

## 2019-03-04 DIAGNOSIS — J90 Pleural effusion, not elsewhere classified: Secondary | ICD-10-CM | POA: Diagnosis not present

## 2019-03-04 DIAGNOSIS — D62 Acute posthemorrhagic anemia: Secondary | ICD-10-CM | POA: Diagnosis not present

## 2019-03-04 DIAGNOSIS — Z20822 Contact with and (suspected) exposure to covid-19: Secondary | ICD-10-CM | POA: Diagnosis not present

## 2019-03-04 DIAGNOSIS — I440 Atrioventricular block, first degree: Secondary | ICD-10-CM | POA: Diagnosis not present

## 2019-03-04 DIAGNOSIS — I252 Old myocardial infarction: Secondary | ICD-10-CM | POA: Diagnosis not present

## 2019-03-04 DIAGNOSIS — I352 Nonrheumatic aortic (valve) stenosis with insufficiency: Secondary | ICD-10-CM | POA: Diagnosis not present

## 2019-03-04 DIAGNOSIS — R079 Chest pain, unspecified: Secondary | ICD-10-CM | POA: Diagnosis not present

## 2019-03-04 DIAGNOSIS — Z4682 Encounter for fitting and adjustment of non-vascular catheter: Secondary | ICD-10-CM | POA: Diagnosis not present

## 2019-03-04 DIAGNOSIS — Z01818 Encounter for other preprocedural examination: Secondary | ICD-10-CM | POA: Diagnosis not present

## 2019-03-04 DIAGNOSIS — F419 Anxiety disorder, unspecified: Secondary | ICD-10-CM | POA: Diagnosis present

## 2019-03-04 DIAGNOSIS — K0530 Chronic periodontitis, unspecified: Secondary | ICD-10-CM | POA: Diagnosis present

## 2019-03-04 DIAGNOSIS — J479 Bronchiectasis, uncomplicated: Secondary | ICD-10-CM | POA: Diagnosis not present

## 2019-03-04 DIAGNOSIS — Z01810 Encounter for preprocedural cardiovascular examination: Secondary | ICD-10-CM | POA: Diagnosis not present

## 2019-03-04 DIAGNOSIS — I38 Endocarditis, valve unspecified: Secondary | ICD-10-CM | POA: Diagnosis not present

## 2019-03-04 DIAGNOSIS — Z6839 Body mass index (BMI) 39.0-39.9, adult: Secondary | ICD-10-CM | POA: Diagnosis not present

## 2019-03-04 DIAGNOSIS — I110 Hypertensive heart disease with heart failure: Secondary | ICD-10-CM | POA: Diagnosis not present

## 2019-03-04 DIAGNOSIS — D7582 Heparin induced thrombocytopenia (HIT): Secondary | ICD-10-CM | POA: Diagnosis not present

## 2019-03-04 DIAGNOSIS — E785 Hyperlipidemia, unspecified: Secondary | ICD-10-CM | POA: Diagnosis not present

## 2019-03-04 DIAGNOSIS — I4891 Unspecified atrial fibrillation: Secondary | ICD-10-CM | POA: Diagnosis not present

## 2019-03-04 DIAGNOSIS — Z01811 Encounter for preprocedural respiratory examination: Secondary | ICD-10-CM | POA: Diagnosis not present

## 2019-03-04 DIAGNOSIS — I25110 Atherosclerotic heart disease of native coronary artery with unstable angina pectoris: Secondary | ICD-10-CM | POA: Diagnosis not present

## 2019-03-04 DIAGNOSIS — K011 Impacted teeth: Secondary | ICD-10-CM | POA: Diagnosis present

## 2019-03-04 DIAGNOSIS — Z8616 Personal history of COVID-19: Secondary | ICD-10-CM | POA: Diagnosis not present

## 2019-03-04 HISTORY — PX: LEFT HEART CATH AND CORONARY ANGIOGRAPHY: CATH118249

## 2019-03-04 LAB — HEPARIN LEVEL (UNFRACTIONATED): Heparin Unfractionated: 0.16 IU/mL — ABNORMAL LOW (ref 0.30–0.70)

## 2019-03-04 LAB — SARS CORONAVIRUS 2 (TAT 6-24 HRS): SARS Coronavirus 2: NEGATIVE

## 2019-03-04 LAB — POC SARS CORONAVIRUS 2 AG -  ED: SARS Coronavirus 2 Ag: NEGATIVE

## 2019-03-04 LAB — TROPONIN I (HIGH SENSITIVITY)
Troponin I (High Sensitivity): 1464 ng/L (ref <18)
Troponin I (High Sensitivity): 3155 ng/L (ref <18)

## 2019-03-04 LAB — ECHOCARDIOGRAM COMPLETE
Height: 66 in
Weight: 4232.83 oz

## 2019-03-04 LAB — HIV ANTIBODY (ROUTINE TESTING W REFLEX): HIV Screen 4th Generation wRfx: NONREACTIVE

## 2019-03-04 SURGERY — LEFT HEART CATH AND CORONARY ANGIOGRAPHY
Anesthesia: LOCAL

## 2019-03-04 MED ORDER — HEPARIN (PORCINE) IN NACL 1000-0.9 UT/500ML-% IV SOLN
INTRAVENOUS | Status: DC | PRN
  Administered 2019-03-04 (×2): 500 mL

## 2019-03-04 MED ORDER — YOU HAVE A PACEMAKER BOOK
Freq: Once | Status: DC
  Filled 2019-03-04: qty 1

## 2019-03-04 MED ORDER — IOHEXOL 350 MG/ML SOLN
INTRAVENOUS | Status: DC | PRN
  Administered 2019-03-04: 150 mL via INTRA_ARTERIAL

## 2019-03-04 MED ORDER — SODIUM CHLORIDE 0.9% FLUSH
3.0000 mL | Freq: Two times a day (BID) | INTRAVENOUS | Status: DC
  Administered 2019-03-05: 3 mL via INTRAVENOUS

## 2019-03-04 MED ORDER — IOHEXOL 350 MG/ML SOLN
INTRAVENOUS | Status: AC
  Filled 2019-03-04: qty 1

## 2019-03-04 MED ORDER — SODIUM CHLORIDE 0.9 % IV SOLN: INTRAVENOUS | Status: AC

## 2019-03-04 MED ORDER — ATORVASTATIN CALCIUM 40 MG PO TABS
40.0000 mg | ORAL_TABLET | Freq: Every day | ORAL | Status: DC
  Administered 2019-03-04: 40 mg via ORAL
  Filled 2019-03-04: qty 1

## 2019-03-04 MED ORDER — LIDOCAINE HCL (PF) 1 % IJ SOLN
INTRAMUSCULAR | Status: AC
  Filled 2019-03-04: qty 30

## 2019-03-04 MED ORDER — SODIUM CHLORIDE 0.9 % WEIGHT BASED INFUSION
3.0000 mL/kg/h | INTRAVENOUS | Status: DC
  Administered 2019-03-04: 3 mL/kg/h via INTRAVENOUS

## 2019-03-04 MED ORDER — PERFLUTREN LIPID MICROSPHERE
1.0000 mL | INTRAVENOUS | Status: AC | PRN
  Administered 2019-03-04: 2 mL via INTRAVENOUS
  Filled 2019-03-04: qty 10

## 2019-03-04 MED ORDER — ASPIRIN EC 81 MG PO TBEC
81.0000 mg | DELAYED_RELEASE_TABLET | Freq: Every day | ORAL | Status: DC
  Administered 2019-03-04 – 2019-03-05 (×2): 81 mg via ORAL
  Filled 2019-03-04 (×2): qty 1

## 2019-03-04 MED ORDER — LISINOPRIL 40 MG PO TABS
40.0000 mg | ORAL_TABLET | Freq: Every day | ORAL | Status: DC
  Administered 2019-03-04 – 2019-03-05 (×2): 40 mg via ORAL
  Filled 2019-03-04: qty 2
  Filled 2019-03-04: qty 1

## 2019-03-04 MED ORDER — MIDAZOLAM HCL 2 MG/2ML IJ SOLN
INTRAMUSCULAR | Status: DC | PRN
  Administered 2019-03-04: 2 mg via INTRAVENOUS

## 2019-03-04 MED ORDER — SODIUM CHLORIDE 0.9% FLUSH
3.0000 mL | Freq: Two times a day (BID) | INTRAVENOUS | Status: DC
  Administered 2019-03-04 – 2019-03-05 (×3): 3 mL via INTRAVENOUS

## 2019-03-04 MED ORDER — LIDOCAINE HCL (PF) 1 % IJ SOLN
INTRAMUSCULAR | Status: DC | PRN
  Administered 2019-03-04: 18 mL
  Administered 2019-03-04: 2 mL

## 2019-03-04 MED ORDER — HEPARIN (PORCINE) 25000 UT/250ML-% IV SOLN
1650.0000 [IU]/h | INTRAVENOUS | Status: DC
  Administered 2019-03-04: 1450 [IU]/h via INTRAVENOUS
  Administered 2019-03-04: 1650 [IU]/h via INTRAVENOUS
  Filled 2019-03-04 (×2): qty 250

## 2019-03-04 MED ORDER — HEPARIN (PORCINE) 25000 UT/250ML-% IV SOLN
1750.0000 [IU]/h | INTRAVENOUS | Status: DC
  Administered 2019-03-05: 1750 [IU]/h via INTRAVENOUS
  Administered 2019-03-05: 1650 [IU]/h via INTRAVENOUS
  Administered 2019-03-06: 1750 [IU]/h via INTRAVENOUS
  Filled 2019-03-04 (×4): qty 250

## 2019-03-04 MED ORDER — HEPARIN SODIUM (PORCINE) 1000 UNIT/ML IJ SOLN
INTRAMUSCULAR | Status: AC
  Filled 2019-03-04: qty 1

## 2019-03-04 MED ORDER — AMLODIPINE BESYLATE 10 MG PO TABS
10.0000 mg | ORAL_TABLET | Freq: Every day | ORAL | Status: DC
  Administered 2019-03-04 – 2019-03-05 (×2): 10 mg via ORAL
  Filled 2019-03-04 (×2): qty 2

## 2019-03-04 MED ORDER — HEART ATTACK BOUNCING BOOK
Freq: Once | Status: AC
  Filled 2019-03-04: qty 1

## 2019-03-04 MED ORDER — SODIUM CHLORIDE 0.9 % WEIGHT BASED INFUSION: 1.0000 mL/kg/h | INTRAVENOUS | Status: DC

## 2019-03-04 MED ORDER — FUROSEMIDE 40 MG PO TABS
40.0000 mg | ORAL_TABLET | Freq: Every day | ORAL | Status: DC
  Administered 2019-03-04 – 2019-03-05 (×2): 40 mg via ORAL
  Filled 2019-03-04: qty 1
  Filled 2019-03-04: qty 2

## 2019-03-04 MED ORDER — MIDAZOLAM HCL 2 MG/2ML IJ SOLN
INTRAMUSCULAR | Status: AC
  Filled 2019-03-04: qty 2

## 2019-03-04 MED ORDER — FENTANYL CITRATE (PF) 100 MCG/2ML IJ SOLN
INTRAMUSCULAR | Status: AC
  Filled 2019-03-04: qty 2

## 2019-03-04 MED ORDER — SODIUM CHLORIDE 0.9 % IV SOLN: 250.0000 mL | INTRAVENOUS | Status: DC | PRN

## 2019-03-04 MED ORDER — NITROGLYCERIN 0.4 MG SL SUBL: 0.4000 mg | SUBLINGUAL_TABLET | SUBLINGUAL | Status: DC | PRN

## 2019-03-04 MED ORDER — ONDANSETRON HCL 4 MG/2ML IJ SOLN: 4.0000 mg | Freq: Four times a day (QID) | INTRAMUSCULAR | Status: DC | PRN

## 2019-03-04 MED ORDER — SODIUM CHLORIDE 0.9% FLUSH: 3.0000 mL | INTRAVENOUS | Status: DC | PRN

## 2019-03-04 MED ORDER — HYDRALAZINE HCL 20 MG/ML IJ SOLN: 10.0000 mg | INTRAMUSCULAR | Status: AC | PRN

## 2019-03-04 MED ORDER — SODIUM CHLORIDE 0.9 % WEIGHT BASED INFUSION: 3.0000 mL/kg/h | INTRAVENOUS | Status: DC

## 2019-03-04 MED ORDER — LABETALOL HCL 5 MG/ML IV SOLN: 10.0000 mg | INTRAVENOUS | Status: AC | PRN

## 2019-03-04 MED ORDER — HEPARIN (PORCINE) IN NACL 1000-0.9 UT/500ML-% IV SOLN
INTRAVENOUS | Status: AC
  Filled 2019-03-04: qty 1000

## 2019-03-04 MED ORDER — SODIUM CHLORIDE 0.9 % WEIGHT BASED INFUSION
1.0000 mL/kg/h | INTRAVENOUS | Status: DC
  Administered 2019-03-04: 1 mL/kg/h via INTRAVENOUS

## 2019-03-04 MED ORDER — VERAPAMIL HCL 2.5 MG/ML IV SOLN
INTRAVENOUS | Status: DC | PRN
  Administered 2019-03-04: 10 mL via INTRA_ARTERIAL

## 2019-03-04 MED ORDER — ASPIRIN EC 81 MG PO TBEC: 81.0000 mg | DELAYED_RELEASE_TABLET | Freq: Every day | ORAL | Status: DC

## 2019-03-04 MED ORDER — VERAPAMIL HCL 2.5 MG/ML IV SOLN
INTRAVENOUS | Status: AC
  Filled 2019-03-04: qty 2

## 2019-03-04 MED ORDER — HEPARIN BOLUS VIA INFUSION
4000.0000 [IU] | Freq: Once | INTRAVENOUS | Status: AC
  Administered 2019-03-04: 4000 [IU] via INTRAVENOUS
  Filled 2019-03-04: qty 4000

## 2019-03-04 MED ORDER — ACETAMINOPHEN 325 MG PO TABS: 650.0000 mg | ORAL_TABLET | ORAL | Status: DC | PRN

## 2019-03-04 MED ORDER — FENTANYL CITRATE (PF) 100 MCG/2ML IJ SOLN
INTRAMUSCULAR | Status: DC | PRN
  Administered 2019-03-04: 50 ug via INTRAVENOUS

## 2019-03-04 SURGICAL SUPPLY — 21 items
CATH 5FR JL3.5 JR4 ANG PIG MP (CATHETERS) ×1 IMPLANT
CATH INFINITI 5FR AL1 (CATHETERS) ×1 IMPLANT
CATH INFINITI 5FR JL4 (CATHETERS) ×1 IMPLANT
CATH INFINITI 5FR JL5 (CATHETERS) ×1 IMPLANT
CATH LAUNCHER 5F EBU4.0 (CATHETERS) ×1 IMPLANT
CATH VISTA GUIDE 6FR XBLAD4 (CATHETERS) ×1 IMPLANT
CLOSURE MYNX CONTROL 6F/7F (Vascular Products) ×1 IMPLANT
DEVICE RAD COMP TR BAND LRG (VASCULAR PRODUCTS) ×1 IMPLANT
GLIDESHEATH SLEND SS 6F .021 (SHEATH) ×1 IMPLANT
GUIDEWIRE INQWIRE 1.5J.035X260 (WIRE) IMPLANT
INQWIRE 1.5J .035X260CM (WIRE) ×2
KIT HEART LEFT (KITS) ×2 IMPLANT
PACK CARDIAC CATHETERIZATION (CUSTOM PROCEDURE TRAY) ×2 IMPLANT
SHEATH PINNACLE 5F 10CM (SHEATH) ×1 IMPLANT
SHEATH PINNACLE 6F 10CM (SHEATH) ×1 IMPLANT
SHEATH PROBE COVER 6X72 (BAG) ×1 IMPLANT
TRANSDUCER W/STOPCOCK (MISCELLANEOUS) ×2 IMPLANT
TUBING CIL FLEX 10 FLL-RA (TUBING) ×2 IMPLANT
WIRE EMERALD 3MM-J .035X150CM (WIRE) ×1 IMPLANT
WIRE EMERALD ST .035X150CM (WIRE) ×1 IMPLANT
WIRE HI TORQ VERSACORE-J 145CM (WIRE) ×1 IMPLANT

## 2019-03-04 NOTE — Progress Notes (Addendum)
Progress Note  Patient Name: Gregory [name] Date of Encounter: 03/04/2019  Primary Cardiologist: [name], MD   Subjective   Patient is comfortable in bed. No further chest pain. Breathing is normal.   Inpatient Medications    Scheduled Meds: . amLODipine  10 mg Oral Daily  . [START ON 03/05/2019] aspirin EC  81 mg Oral Daily  . atorvastatin  40 mg Oral Daily  . furosemide  40 mg Oral Daily  . lisinopril  40 mg Oral Daily   Continuous Infusions: . heparin 1,650 Units/hr (03/04/19 0905)   PRN Meds: acetaminophen, nitroGLYCERIN, ondansetron (ZOFRAN) IV   Vital Signs    Vitals:   03/04/19 0800 03/04/19 0815 03/04/19 0830 03/04/19 0845  BP: (!) 136/56 (!) 133/52 (!) 123/59 (!) 144/47  Pulse:      Resp: (!) 24 17 16 18   Temp:      TempSrc:      SpO2:      Weight:      Height:       No intake or output data in the 24 hours ending 03/04/19 0908 Last 3 Weights 03/03/2019 11/17/2018 11/11/2018  Weight (lbs) 264 lb 8.8 oz 253 lb 256 lb 12 oz  Weight (kg) 120 kg 114.76 kg 116.461 kg      Telemetry     - Personally Reviewed  ECG    NSR, LVH, minimal ST depression lateral leads - Personally Reviewed  Physical Exam   GEN: No acute distress.   Neck: No JVD Cardiac: RRR, + murmurs,no rubs, or gallops.  Respiratory: Clear to auscultation bilaterally. GI: Soft, nontender, non-distended  MS: trace edema; No deformity. Neuro:  Nonfocal  Psych: Normal affect   Labs    High Sensitivity Troponin:   Recent Labs  Lab 03/03/19 2113 03/03/19 2319  TROPONINIHS 741* 1,464*      Chemistry Recent Labs  Lab 03/03/19 2113  NA 139  K 3.8  CL 106  CO2 24  GLUCOSE 114*  BUN 18  CREATININE 0.88  CALCIUM 8.7*  GFRNONAA >60  GFRAA >60  ANIONGAP 9     Hematology Recent Labs  Lab 03/03/19 2113  WBC 8.4  RBC 3.78*  HGB 11.4*  HCT 35.3*  MCV 93.4  MCH 30.2  MCHC 32.3  RDW 13.3  PLT 227    BNPNo results for input(s): BNP, PROBNP in the last 168  hours.   DDimer No results for input(s): DDIMER in the last 168 hours.   Radiology    DG Chest 2 View  Result Date: 03/03/2019 CLINICAL DATA:  Chest pain radiating into the left arm. EXAM: CHEST - 2 VIEW COMPARISON:  None. FINDINGS: Cardiac shadow is at the upper limits of normal in size. Aortic calcifications are seen. The lungs are clear without focal infiltrate or effusion. No acute bony abnormality is noted. IMPRESSION: No active cardiopulmonary disease. Electronically Signed   By: Inez Catalina M.D.   On: 03/03/2019 21:29    Cardiac Studies   Echo ordered  Echo 08/2018 1. The left ventricle has normal systolic function, with an ejection  fraction of 55-60%. The cavity size was normal. Left ventricular diastolic  Doppler parameters are consistent with pseudonormalization. Elevated left  ventricular end-diastolic pressure  No evidence of left ventricular regional wall motion abnormalities.  2. The right ventricle has normal systolic function. The cavity was  normal. There is no increase in right ventricular wall thickness.  3. Left atrial size was mildly dilated.  4. The  mitral valve is degenerative. Mild thickening of the mitral valve  leaflet. There is moderate mitral annular calcification present.  5. The aortic valve is tricuspid. Moderate thickening of the aortic  valve. Severe calcifcation of the aortic valve. Aortic valve regurgitation  is mild to moderate by color flow Doppler. Mild to moderate stenosis of  the aortic valve. AV Mean Grad: 26.0  mmHg, AV Area (VTI): 1.57 cm, LVOT/AV VTI ratio: 0.45.  6. The aorta is abnormal unless otherwise noted.  7. There is mild dilatation of the ascending aorta and of the aortic root  measuring 42 mm.  8. Compared to prior echo, the mean AVG has increased from 20 to 83mmHg.   Patient Profile     69 y.o. male with pmh of mod AS and AI, prediabetes, Vitamin D deficiency, HTN, HLD, OSA, chronic lymphedema, obesity, back pain,  ETOH use, chronic exertional dyspnea, and COVID infection 2 months ago who presents to the ER for chest pain.   Assessment & Plan    NSTEMI Patient presents with chest pain described as "a small child sitting on my chest" with associated nausea. Received NTG and ASA prior to ariival. EKG with minimal ST depressions in the lateral leads and TWI in the inferior leads. HS troponin 741 > 1,464. COVID negative. Echo from August 2020 showed preserved LV function.  - continue IV heparin - continue to trend troponin - continue aspirin - continue atorvastatin 40 mg - echo ordered - Would plan for cath. Creatinine stable Risks and benefits of cardiac catheterization have been discussed with the patient.  These include bleeding, infection, kidney damage, stroke, heart attack, death.  The patient understands these risks and is willing to proceed.  HLD - continue statin - LDL 74 08/2018, goal <70>>increase statin  AS/AI - moderate AS mean gradient 57mmHg, AV area 1.57cm2 (compared to prior echo the AVG has increased from 20 to 76mmHg) -  moderate AI - echo ordered  HTN - continue lisinopril and amlodipine - pressure stable    For questions or updates, please contact [facility] Please consult [email or web address] for contact info under        Signed, [name], MD  03/04/2019, 9:08 AM    History and all data above reviewed.  Patient examined.  I agree with the findings as above.  The patient has no further chest pain.  Reports two days of on and off pain.    The patient exam reveals COR:RRR, systolic murmur unchanged from previous  ,  Lungs: Clear  ,  Abd: Positive bowel sounds, no rebound no guarding, Ext No edema  .  All available labs, radiology testing, previous records reviewed. Agree with documented assessment and plan. NSTEMI:  Cath today.  Discussed with the patient.    [name]  9:11 AM  03/04/2019

## 2019-03-04 NOTE — Progress Notes (Signed)
  Echocardiogram 2D Echocardiogram has been performed.  Gregory [name] 03/04/2019, 12:18 PM

## 2019-03-04 NOTE — H&P (View-Only) (Signed)
Progress Note  Patient Name: Gregory [name] Date of Encounter: 03/04/2019  Primary Cardiologist: [name], MD   Subjective   Patient is comfortable in bed. No further chest pain. Breathing is normal.   Inpatient Medications    Scheduled Meds: . amLODipine  10 mg Oral Daily  . [START ON 03/05/2019] aspirin EC  81 mg Oral Daily  . atorvastatin  40 mg Oral Daily  . furosemide  40 mg Oral Daily  . lisinopril  40 mg Oral Daily   Continuous Infusions: . heparin 1,650 Units/hr (03/04/19 0905)   PRN Meds: acetaminophen, nitroGLYCERIN, ondansetron (ZOFRAN) IV   Vital Signs    Vitals:   03/04/19 0800 03/04/19 0815 03/04/19 0830 03/04/19 0845  BP: (!) 136/56 (!) 133/52 (!) 123/59 (!) 144/47  Pulse:      Resp: (!) 24 17 16 18   Temp:      TempSrc:      SpO2:      Weight:      Height:       No intake or output data in the 24 hours ending 03/04/19 0908 Last 3 Weights 03/03/2019 11/17/2018 11/11/2018  Weight (lbs) 264 lb 8.8 oz 253 lb 256 lb 12 oz  Weight (kg) 120 kg 114.76 kg 116.461 kg      Telemetry     - Personally Reviewed  ECG    NSR, LVH, minimal ST depression lateral leads - Personally Reviewed  Physical Exam   GEN: No acute distress.   Neck: No JVD Cardiac: RRR, + murmurs,no rubs, or gallops.  Respiratory: Clear to auscultation bilaterally. GI: Soft, nontender, non-distended  MS: trace edema; No deformity. Neuro:  Nonfocal  Psych: Normal affect   Labs    High Sensitivity Troponin:   Recent Labs  Lab 03/03/19 2113 03/03/19 2319  TROPONINIHS 741* 1,464*      Chemistry Recent Labs  Lab 03/03/19 2113  NA 139  K 3.8  CL 106  CO2 24  GLUCOSE 114*  BUN 18  CREATININE 0.88  CALCIUM 8.7*  GFRNONAA >60  GFRAA >60  ANIONGAP 9     Hematology Recent Labs  Lab 03/03/19 2113  WBC 8.4  RBC 3.78*  HGB 11.4*  HCT 35.3*  MCV 93.4  MCH 30.2  MCHC 32.3  RDW 13.3  PLT 227    BNPNo results for input(s): BNP, PROBNP in the last 168  hours.   DDimer No results for input(s): DDIMER in the last 168 hours.   Radiology    DG Chest 2 View  Result Date: 03/03/2019 CLINICAL DATA:  Chest pain radiating into the left arm. EXAM: CHEST - 2 VIEW COMPARISON:  None. FINDINGS: Cardiac shadow is at the upper limits of normal in size. Aortic calcifications are seen. The lungs are clear without focal infiltrate or effusion. No acute bony abnormality is noted. IMPRESSION: No active cardiopulmonary disease. Electronically Signed   By: Inez Catalina M.D.   On: 03/03/2019 21:29    Cardiac Studies   Echo ordered  Echo 08/2018 1. The left ventricle has normal systolic function, with an ejection  fraction of 55-60%. The cavity size was normal. Left ventricular diastolic  Doppler parameters are consistent with pseudonormalization. Elevated left  ventricular end-diastolic pressure  No evidence of left ventricular regional wall motion abnormalities.  2. The right ventricle has normal systolic function. The cavity was  normal. There is no increase in right ventricular wall thickness.  3. Left atrial size was mildly dilated.  4. The  mitral valve is degenerative. Mild thickening of the mitral valve  leaflet. There is moderate mitral annular calcification present.  5. The aortic valve is tricuspid. Moderate thickening of the aortic  valve. Severe calcifcation of the aortic valve. Aortic valve regurgitation  is mild to moderate by color flow Doppler. Mild to moderate stenosis of  the aortic valve. AV Mean Grad: 26.0  mmHg, AV Area (VTI): 1.57 cm, LVOT/AV VTI ratio: 0.45.  6. The aorta is abnormal unless otherwise noted.  7. There is mild dilatation of the ascending aorta and of the aortic root  measuring 42 mm.  8. Compared to prior echo, the mean AVG has increased from 20 to 28mmHg.   Patient Profile     69 y.o. male with pmh of mod AS and AI, prediabetes, Vitamin D deficiency, HTN, HLD, OSA, chronic lymphedema, obesity, back pain,  ETOH use, chronic exertional dyspnea, and COVID infection 2 months ago who presents to the ER for chest pain.   Assessment & Plan    NSTEMI Patient presents with chest pain described as "a small child sitting on my chest" with associated nausea. Received NTG and ASA prior to ariival. EKG with minimal ST depressions in the lateral leads and TWI in the inferior leads. HS troponin 741 > 1,464. COVID negative. Echo from August 2020 showed preserved LV function.  - continue IV heparin - continue to trend troponin - continue aspirin - continue atorvastatin 40 mg - echo ordered - Would plan for cath. Creatinine stable Risks and benefits of cardiac catheterization have been discussed with the patient.  These include bleeding, infection, kidney damage, stroke, heart attack, death.  The patient understands these risks and is willing to proceed.  HLD - continue statin - LDL 74 08/2018, goal <70>>increase statin  AS/AI - moderate AS mean gradient 57mmHg, AV area 1.57cm2 (compared to prior echo the AVG has increased from 20 to 21mmHg) -  moderate AI - echo ordered  HTN - continue lisinopril and amlodipine - pressure stable    For questions or updates, please contact [facility] Please consult [email or web address] for contact info under        Signed, [name], MD  03/04/2019, 9:08 AM    History and all data above reviewed.  Patient examined.  I agree with the findings as above.  The patient has no further chest pain.  Reports two days of on and off pain.    The patient exam reveals COR:RRR, systolic murmur unchanged from previous  ,  Lungs: Clear  ,  Abd: Positive bowel sounds, no rebound no guarding, Ext No edema  .  All available labs, radiology testing, previous records reviewed. Agree with documented assessment and plan. NSTEMI:  Cath today.  Discussed with the patient.    [name]  9:11 AM  03/04/2019

## 2019-03-04 NOTE — Progress Notes (Signed)
ANTICOAGULATION CONSULT NOTE  Pharmacy Consult for Heparin Indication: chest pain/ACS  No Known Allergies  Patient Measurements: Height: 5\' 6"  (167.6 cm) Weight: 264 lb 8.8 oz (120 kg) IBW/kg (Calculated) : 63.8 Heparin Dosing Weight: 91.8 kg  Vital Signs: Temp: 97.7 F (36.5 C) (03/02 2119) Temp Source: Oral (03/02 2119) BP: 127/43 (03/03 0745) Pulse Rate: 67 (03/03 0600)  Labs: Recent Labs    03/03/19 2113 03/03/19 2319 03/04/19 0731  HGB 11.4*  --   --   HCT 35.3*  --   --   PLT 227  --   --   LABPROT 14.0  --   --   INR 1.1  --   --   HEPARINUNFRC  --   --  0.16*  CREATININE 0.88  --   --   TROPONINIHS 741* 1,464*  --     Estimated Creatinine Clearance: 95.3 mL/min (by C-G formula based on SCr of 0.88 mg/dL).   Medical History: Past Medical History:  Diagnosis Date  . Alcohol abuse   . Anxiety with flying   . Aortic insufficiency   . Drug use   . Edema of both lower extremities   . Glaucoma   . Heart murmur   . Hypertension   . Joint pain   . OSA (obstructive sleep apnea)   . Shingles   . SOB (shortness of breath)     Medications:  No current facility-administered medications on file prior to encounter.   Current Outpatient Medications on File Prior to Encounter  Medication Sig Dispense Refill  . ALPRAZolam (XANAX) 0.5 MG tablet Take 1 tablet (0.5 mg total) by mouth daily as needed for anxiety (30 min prior to plane flight or prn anxiety). 20 tablet 0  . amLODipine (NORVASC) 10 MG tablet Take 1 tablet by mouth once daily (Patient taking differently: Take 10 mg by mouth daily. ) 90 tablet 0  . atorvastatin (LIPITOR) 40 MG tablet Take 1 tablet by mouth once daily (Patient taking differently: Take 40 mg by mouth every morning. ) 90 tablet 2  . diclofenac (VOLTAREN) 75 MG EC tablet Take 1 tablet by mouth twice daily (Patient taking differently: Take 75 mg by mouth daily. ) 30 tablet 0  . furosemide (LASIX) 40 MG tablet Take 1 tablet by mouth once daily  (Patient taking differently: Take 40 mg by mouth daily. ) 90 tablet 0  . latanoprost (XALATAN) 0.005 % ophthalmic solution Place 1 drop into both eyes every morning.     [name] lisinopril (ZESTRIL) 40 MG tablet Take 1 tablet by mouth once daily (Patient taking differently: Take 40 mg by mouth daily. ) 90 tablet 0  . Misc Natural Products (OSTEO BI-FLEX/5-LOXIN ADVANCED PO) Take 2 tablets by mouth daily.     . potassium chloride SA (KLOR-CON) 20 MEQ tablet Take 1 tablet by mouth once daily (Patient taking differently: Take 20 mEq by mouth daily. ) 90 tablet 1  . cyclobenzaprine (FLEXERIL) 10 MG tablet Take 1 tablet (10 mg total) by mouth at bedtime as needed for muscle spasms. (Patient not taking: Reported on 03/04/2019) 15 tablet 0  . [DISCONTINUED] aspirin 81 MG chewable tablet Chew by mouth daily.    . [DISCONTINUED] Ferrous Sulfate (IRON) 325 (65 Fe) MG TABS Take by mouth.    . [DISCONTINUED] Vitamin D, Ergocalciferol, (DRISDOL) 1.25 MG (50000 UT) CAPS capsule Take 1 capsule (50,000 Units total) by mouth every 7 (seven) days. 12 capsule 0     Assessment: 69 y.o. male  presenting with NSTEMI, continuing on heparin IV. Cardiology planning cath today. Patient is not on anticoagulation PTA. Hg 11.4, plt wnl. Initial heparin level low at 0.16. No bleeding or issues with infusion per discussion with RN.  Goal of Therapy:  Heparin level 0.3-0.7 units/ml Monitor platelets by anticoagulation protocol: Yes   Plan:  Increase heparin to 1650 units/hr 8hr heparin level Monitor daily heparin level and CBC, s/sx bleeding Cath planned 3/3   Arturo Morton, PharmD, BCPS Please check AMION for all [place] contact numbers Clinical Pharmacist 03/04/2019 8:27 AM

## 2019-03-04 NOTE — Progress Notes (Signed)
ANTICOAGULATION CONSULT NOTE  Pharmacy Consult for Heparin Indication: chest pain/ACS  No Known Allergies  Patient Measurements: Height: 5\' 7"  (170.2 cm) Weight: 241 lb 11.2 oz (109.6 kg) IBW/kg (Calculated) : 66.1 Heparin Dosing Weight: 91.8 kg  Vital Signs: Temp: 98.9 F (37.2 C) (03/03 1657) Temp Source: Oral (03/03 1657) BP: 121/52 (03/03 1657) Pulse Rate: 66 (03/03 1657)  Labs: Recent Labs    03/03/19 2113 03/03/19 2319 03/04/19 0505 03/04/19 0731  HGB 11.4*  --   --   --   HCT 35.3*  --   --   --   PLT 227  --   --   --   LABPROT 14.0  --   --   --   INR 1.1  --   --   --   HEPARINUNFRC  --   --   --  0.16*  CREATININE 0.88  --   --   --   TROPONINIHS 741* 1,464* 3,155*  --     Estimated Creatinine Clearance: 92.3 mL/min (by C-G formula based on SCr of 0.88 mg/dL).   Medical History: Past Medical History:  Diagnosis Date  . Alcohol abuse   . Aortic valve disease    AI and AS, Moderate  . Chest pain 03/2019  . Drug use   . Edema of both lower extremities   . Glaucoma   . Hypertension   . OSA (obstructive sleep apnea)   . Shingles     Assessment: 69 yr old male presented with NSTEMI, for which pharmacy was consulted to dose IV heparin. Patient was not on anticoagulation PTA. Hgb 11.4,platelets 227.   Pt is S/P cath today, with findings of complex disease in mid LAD, diffuse complex disease in proximal, and mid RCA and moderate AI/AS; CT surgery to see pt re: surgical AVR and bypass of LAD and RCA. Pharmacy is consulted to restart heparin 8 hrs after sheath removal (per procedure log, sheath removed at 16:35 PM today).  Goal of Therapy:  Heparin level 0.3-0.7 units/ml Monitor platelets by anticoagulation protocol: Yes   Plan:  Restart heparin infusion at 1650 units/hr ~8 hrs after sheath removal (restart heparin at 12:30 AM tomorrow, 3/4) Check heparin level 8 hrs after restarting heparin infusion Monitor daily heparin level, CBC Monitor for  signs/symptoms of bleeding  Gillermina Hu, PharmD, BCPS, [facility] Clinical Pharmacist 03/04/2019 5:16 PM

## 2019-03-04 NOTE — ED Notes (Signed)
Echo at bedside

## 2019-03-04 NOTE — ED Notes (Signed)
Labs collected by phlebotomy  

## 2019-03-04 NOTE — H&P (Signed)
Cardiology Admission History and Physical:   Patient ID: Gregory [name]; MRN: [id number]; DOB: 30-Apr-1950   Admission date: 03/03/2019  Primary Care Provider: Jinny Sanders, MD Primary Cardiologist: No primary care provider on file.   Chief Complaint:  Chest pain  History of Present Illness:   Gregory [name] is a 69 y.o. male with a history of moderate AS and AI, prediabetes (not on medications), vitamin D deficiency, hypertension, hyperlipidemia, obstructive sleep apnea, chronic lymphedema, obesity, back pain, ETOH use, and chronic exertional dyspnea who now presents to the hospital with complaints of central chest pain. There was some associated nausea but no vomiting or diaphoresis. The chest pain did not radiate.   In the ED the blood pressure was 141/57 mmHg with a heart rate of 83 bpm.  Respiratory rate was 16.  The chest x-ray was unremarkable.  The high-sensitivity troponins were 741 and 1464.  The initial ECG revealed ST depressions in V4 V5 and V6 with T wave inversions in the inferior leads.  The subsequent electrocardiogram revealed normal sinus rhythm, ventricular rate of 74 bpm, voltage criteria for LVH and nonspecific T wave abnormalities.  He received sublingual NTG and 324 mg of ASA prior to hospital arrival.   Previous cardiac work-up Echocardiogram - 08/18/2018 1. The left ventricle has normal systolic function, with an ejection fraction of 55-60%. The cavity size was normal. Left ventricular diastolic Doppler parameters are consistent with pseudonormalization. Elevated left ventricular end-diastolic pressure No evidence of left ventricular regional wall motion abnormalities. 2. The right ventricle has normal systolic function. The cavity was normal. There is no increase in right ventricular wall thickness. 3. Left atrial size was mildly dilated. 4. The mitral valve is degenerative. Mild thickening of the mitral valve leaflet. There is moderate mitral annular  calcification present. 5. The aortic valve is tricuspid. Moderate thickening of the aortic valve. Severe calcifcation of the aortic valve. Aortic valve regurgitation is mild to moderate by color flow Doppler. Mild to moderate stenosis of the aortic valve. AV Mean Grad: 26.0 mmHg, AV Area (VTI): 1.57 cm, LVOT/AV VTI ratio: 0.45. 6. The aorta is abnormal unless otherwise noted. 7. There is mild dilatation of the ascending aorta and of the aortic root measuring 42 mm. 8. Compared to prior echo, the mean AVG has increased from 20 to 31mmHg.   Past Medical History:  Diagnosis Date  . Alcohol abuse   . Anxiety with flying   . Aortic insufficiency   . Drug use   . Edema of both lower extremities   . Glaucoma   . Heart murmur   . Hypertension   . Joint pain   . OSA (obstructive sleep apnea)   . Shingles   . SOB (shortness of breath)     Past Surgical History:  Procedure Laterality Date  . CATARACT EXTRACTION    . None       Medications Prior to Admission: Prior to Admission medications   Medication Sig Start Date End Date Taking? Authorizing Provider  ALPRAZolam Duanne Moron) 0.5 MG tablet Take 1 tablet (0.5 mg total) by mouth daily as needed for anxiety (30 min prior to plane flight or prn anxiety). 11/11/18   Bedsole, Amy E, MD  amLODipine (NORVASC) 10 MG tablet Take 1 tablet by mouth once daily 01/26/19   Diona Browner, Amy E, MD  aspirin 81 MG chewable tablet Chew by mouth daily.    [provider]  atorvastatin (LIPITOR) 40 MG tablet Take 1 tablet by mouth once  daily 01/19/19   Bedsole, Amy E, MD  cyclobenzaprine (FLEXERIL) 10 MG tablet Take 1 tablet (10 mg total) by mouth at bedtime as needed for muscle spasms. 01/23/19   Jinny Sanders, MD  diclofenac (VOLTAREN) 75 MG EC tablet Take 1 tablet by mouth twice daily 02/10/19   Bedsole, Amy E, MD  Ferrous Sulfate (IRON) 325 (65 Fe) MG TABS Take by mouth.    [provider]  furosemide (LASIX) 40 MG tablet Take 1 tablet by mouth  once daily 01/26/19   Bedsole, Amy E, MD  latanoprost (XALATAN) 0.005 % ophthalmic solution 1 drop at bedtime. 10/25/18   [provider]  lisinopril (ZESTRIL) 40 MG tablet Take 1 tablet by mouth once daily 02/02/19   Bedsole, Amy E, MD  Misc Natural Products (OSTEO BI-FLEX/5-LOXIN ADVANCED PO) Take by mouth.    [provider]  potassium chloride SA (KLOR-CON) 20 MEQ tablet Take 1 tablet by mouth once daily 01/06/19   Bedsole, Amy E, MD  Vitamin D, Ergocalciferol, (DRISDOL) 1.25 MG (50000 UT) CAPS capsule Take 1 capsule (50,000 Units total) by mouth every 7 (seven) days. 11/17/18   [place], Joneen Boers, FNP     Allergies:   No Known Allergies  Social History:   Social History   Socioeconomic History  . Marital status: Married    Spouse name: Cindi  . Number of children: 3  . Years of education: Not on file  . Highest education level: Not on file  Occupational History  . Occupation: Architect  Tobacco Use  . Smoking status: Never Smoker  . Smokeless tobacco: Never Used  Substance and Sexual Activity  . Alcohol use: Yes    Alcohol/week: 5.0 standard drinks    Types: 5 Cans of beer per week  . Drug use: No  . Sexual activity: Not on file  Other Topics Concern  . Not on file  Social History Narrative   Lives at home with wife.   Social Determinants of Health   Financial Resource Strain:   . Difficulty of Paying Living Expenses: Not on file  Food Insecurity:   . Worried About Charity fundraiser in the Last Year: Not on file  . Ran Out of Food in the Last Year: Not on file  Transportation Needs:   . Lack of Transportation (Medical): Not on file  . Lack of Transportation (Non-Medical): Not on file  Physical Activity:   . Days of Exercise per Week: Not on file  . Minutes of Exercise per Session: Not on file  Stress:   . Feeling of Stress : Not on file  Social Connections:   . Frequency of Communication with Friends and Family: Not on file  . Frequency of  Social Gatherings with Friends and Family: Not on file  . Attends Religious Services: Not on file  . Active Member of Clubs or Organizations: Not on file  . Attends Archivist Meetings: Not on file  . Marital Status: Not on file  Intimate Partner Violence:   . Fear of Current or Ex-Partner: Not on file  . Emotionally Abused: Not on file  . Physically Abused: Not on file  . Sexually Abused: Not on file     Family History:  The patient's family history includes Alcohol abuse in his father; Breast cancer in his sister; Cancer in his mother. He was adopted.     Review of Systems: [y] = yes, [ ]  = no   . General: Weight gain [ ] ;  Weight loss [ ] ; Anorexia [ ] ; Fatigue [ ] ; Fever [ ] ; Chills [ ] ; Weakness [ ]   . Cardiac: Chest pain/pressure [Y]; Resting SOB [ ] ; Exertional SOB [Y]; Orthopnea [ ] ; Pedal Edema [Y]; Palpitations [ ] ; Syncope [ ] ; Presyncope [ ] ; Paroxysmal nocturnal dyspnea[ ]   . Pulmonary: Cough [ ] ; Wheezing[ ] ; Hemoptysis[ ] ; Sputum [ ] ; Snoring [ ]   . GI: Vomiting[ ] ; Dysphagia[ ] ; Melena[ ] ; Hematochezia [ ] ; Heartburn[ ] ; Abdominal pain [ ] ; Constipation [ ] ; Diarrhea [ ] ; BRBPR [ ]   . GU: Hematuria[ ] ; Dysuria [ ] ; Nocturia[ ]   . Vascular: Pain in legs with walking [ ] ; Pain in feet with lying flat [ ] ; Non-healing sores [ ] ; Stroke [ ] ; TIA [ ] ; Slurred speech [ ] ;  . Neuro: Headaches[ ] ; Vertigo[ ] ; Seizures[ ] ; Paresthesias[ ] ;Blurred vision [ ] ; Diplopia [ ] ; Vision changes [ ]   . Ortho/Skin: Arthritis [ ] ; Joint pain [Y ]; Muscle pain [ ] ; Joint swelling [ ] ; Back Pain [Y]; Rash [ ]   . Psych: Depression[ ] ; Anxiety[ ]   . Heme: Bleeding problems [ ] ; Clotting disorders [ ] ; Anemia [ ]   . Endocrine: Diabetes [ ] ; Thyroid dysfunction[ ]      Physical Exam/Data:   Vitals:   03/03/19 2105 03/03/19 2119 03/04/19 0034 03/04/19 0042  BP:  (!) 141/57  (!) 109/40  Pulse:  83 69   Resp:  16 16   Temp:  97.7 F (36.5 C)    TempSrc:  Oral    SpO2:  97% 98%    Weight: 120 kg     Height: 5\' 6"  (1.676 m)      No intake or output data in the 24 hours ending 03/04/19 0055 Filed Weights   03/03/19 2105  Weight: 120 kg   Body mass index is 42.7 kg/m.  General:  Well nourished, well developed, in no acute distress HEENT: normal Lymph: no adenopathy Neck: no JVD Endocrine:  No thryomegaly Vascular: No carotid bruits; FA pulses 2+ bilaterally without bruits  Cardiac:  normal S1, S2; 2/6 systolic murmur, soft diastolic murmur. Lungs:  clear to auscultation bilaterally, no wheezing, rhonchi or rales  Abd: soft, nontender, no hepatomegaly  Ext: Nonpitting edema of the bilateral lower extremities Musculoskeletal:  No deformities, BUE and BLE strength normal and equal Skin: warm and dry  Neuro:  CNs 2-12 intact, no focal abnormalities noted Psych:  Normal affect    Laboratory Data:  Chemistry Recent Labs  Lab 03/03/19 2113  NA 139  K 3.8  CL 106  CO2 24  GLUCOSE 114*  BUN 18  CREATININE 0.88  CALCIUM 8.7*  GFRNONAA >60  GFRAA >60  ANIONGAP 9    No results for input(s): PROT, ALBUMIN, AST, ALT, ALKPHOS, BILITOT in the last 168 hours. Hematology Recent Labs  Lab 03/03/19 2113  WBC 8.4  RBC 3.78*  HGB 11.4*  HCT 35.3*  MCV 93.4  MCH 30.2  MCHC 32.3  RDW 13.3  PLT 227   Cardiac EnzymesNo results for input(s): TROPONINI in the last 168 hours. No results for input(s): TROPIPOC in the last 168 hours.  BNPNo results for input(s): BNP, PROBNP in the last 168 hours.  DDimer No results for input(s): DDIMER in the last 168 hours.  Radiology/Studies:  DG Chest 2 View  Result Date: 03/03/2019 CLINICAL DATA:  Chest pain radiating into the left arm. EXAM: CHEST - 2 VIEW COMPARISON:  None. FINDINGS: Cardiac shadow is at the  upper limits of normal in size. Aortic calcifications are seen. The lungs are clear without focal infiltrate or effusion. No acute bony abnormality is noted. IMPRESSION: No active cardiopulmonary disease.  Electronically Signed   By: Inez Catalina M.D.   On: 03/03/2019 21:29    Assessment and Plan:   1. Non-ST elevation myocardial infarction Patient has a known history of moderate AS and moderate AI with preserved LV function.  He now presents to the hospital with complaints of chest pain.  He was noted to have transient ST depressions in the lateral leads with T wave inversions in the inferior leads.  These subsequently normalized.  The high-sensitivity troponins are abnormal.  -Admit to Stepdown unit -ASA 81 mg daily -Continue atorvastatin 40 mg nightly -Unfractionated heparin IV infusion -Consider a repeat transthoracic echocardiogram to evaluate the severity of his valvular disease and to reestimate the LV function. -Continue lisinopril and amlodipine -Continue daily furosemide -Daily weights, strict I and Os -Maintain serum potassium >4.0 and magnesium >2.0 -Keep the patient NPO for possible cardiac catheterization in the morning.   Severity of Illness: The appropriate patient status for this patient is INPATIENT. Inpatient status is judged to be reasonable and necessary in order to provide the required intensity of service to ensure the patient's safety. The patient's presenting symptoms, physical exam findings, and initial radiographic and laboratory data in the context of their chronic comorbidities is felt to place them at high risk for further clinical deterioration. Furthermore, it is not anticipated that the patient will be medically stable for discharge from the hospital within 2 midnights of admission. The following factors support the patient status of inpatient.   " The patient's presenting symptoms include chest pain. " The worrisome physical exam findings include abnormal cardiac auscultation. " The initial radiographic and laboratory data are worrisome because of elevated troponin. " The chronic co-morbidities include hypertension and hyperlipidemia.   * I certify that at  the point of admission it is my clinical judgment that the patient will require inpatient hospital care spanning beyond 2 midnights from the point of admission due to high intensity of service, high risk for further deterioration and high frequency of surveillance required.*    For questions or updates, please contact [place] Please consult [email or web address] for contact info under Cardiology/STEMI.    Signed, Meade Maw, MD  03/04/2019 12:55 AM

## 2019-03-04 NOTE — Interval H&P Note (Signed)
History and Physical Interval Note:  03/04/2019 3:10 PM  Gregory [name]  has presented today for surgery, with the diagnosis of n stemi.  The various methods of treatment have been discussed with the patient and family. After consideration of risks, benefits and other options for treatment, the patient has consented to  Procedure(s): LEFT HEART CATH AND CORONARY ANGIOGRAPHY (N/A) as a surgical intervention.  The patient's history has been reviewed, patient examined, no change in status, stable for surgery.  I have reviewed the patient's chart and labs.  Questions were answered to the patient's satisfaction.    Cath Lab Visit (complete for each Cath Lab visit)  Clinical Evaluation Leading to the Procedure:   ACS: Yes.    Non-ACS:    Anginal Classification: CCS III  Anti-ischemic medical therapy: Minimal Therapy (1 class of medications)  Non-Invasive Test Results: No non-invasive testing performed  Prior CABG: No previous CABG        Lauree Chandler

## 2019-03-04 NOTE — ED Notes (Signed)
Care endorsed to [place], [place]

## 2019-03-04 NOTE — ED Notes (Signed)
Assumed care of pt. Pt resting on cart in NAD. Equal rise and fall of chest noted. Breathing easy, non-labored. VSS on monitors. Call light within reach.  Denies any needs at this time. Will continue to monitor

## 2019-03-04 NOTE — ED Provider Notes (Signed)
Emergency Department Provider Note   I have reviewed the triage vital signs and the nursing notes.   HISTORY  Chief Complaint Chest Pain   HPI Gregory [name] is a 69 y.o. male who presents the emerge department today with chest pain.  Patient states that he initially had the feeling of chest pressure like someone standing on it.  Positive diaphoresis and dyspnea but no dyspnea or lightheadedness.  Patient states that he called EMS and took an aspirin.  When EMS got there they gave him nitroglycerin which resolved his pain he is pain-free at this time.  No history of the same.  Does have history of borderline diabetes, hypertension hyperlipidemia.  Has seen Dr. Percival Spanish before for valve disorder. No recent illnesses.    No other associated or modifying symptoms.    Past Medical History:  Diagnosis Date  . Alcohol abuse   . Anxiety with flying   . Aortic insufficiency   . Drug use   . Edema of both lower extremities   . Glaucoma   . Heart murmur   . Hypertension   . Joint pain   . OSA (obstructive sleep apnea)   . Shingles   . SOB (shortness of breath)     Patient Active Problem List   Diagnosis Date Noted  . NSTEMI (non-ST elevated myocardial infarction) ([place]) 03/04/2019  . Exposure to COVID-19 virus 11/24/2018  . Prediabetes 11/18/2018  . Vitamin D deficiency 09/30/2018  . Insulin resistance 09/17/2018  . Class 2 severe obesity with serious comorbidity and body mass index (BMI) of 39.0 to 39.9 in adult ([place]) 09/17/2018  . Lymphedema 01/13/2018  . Aortic valve stenosis 01/13/2018  . Grade I diastolic dysfunction [id number]  . Situational anxiety 10/30/2016  . Acute left-sided low back pain without sciatica 04/17/2016  . High cholesterol 01/12/2016  . Bilateral primary osteoarthritis of knee 09/23/2015  . Anemia 08/06/2013  . Morbid obesity ([place]) 03/26/2011  . Aortic insufficiency 02/15/2011  . Heart murmur 01/03/2011  . Essential hypertension 10/09/2006     Past Surgical History:  Procedure Laterality Date  . CATARACT EXTRACTION    . None      Current Outpatient Rx  . Order #: [id number] Class: Normal  . Order #: [id number] Class: Normal  . Order #: [id number] Class: Normal  . Order #: [id number] Class: Normal  . Order #: [id number] Class: Normal  . Order #: [id number] Class: Historical Med  . Order #: [id number] Class: Normal  . Order #: [id number] Class: Historical Med  . Order #: [id number] Class: Normal  . Order #: [id number] Class: Normal    Allergies Patient has no known allergies.  Family History  Adopted: Yes  Problem Relation Age of Onset  . Cancer Mother        breast cancer  . Alcohol abuse Father   . Breast cancer Sister     Social History Social History   Tobacco Use  . Smoking status: Never Smoker  . Smokeless tobacco: Never Used  Substance Use Topics  . Alcohol use: Yes    Alcohol/week: 5.0 standard drinks    Types: 5 Cans of beer per week  . Drug use: No    Review of Systems  All other systems negative except as documented in the HPI. All pertinent positives and negatives as reviewed in the HPI. ____________________________________________   PHYSICAL EXAM:  VITAL SIGNS: ED Triage Vitals  Enc Vitals Group     BP 03/03/19 2119 (!) 141/57     Pulse Rate 03/03/19 2119 83  Resp 03/03/19 2119 16     Temp 03/03/19 2119 97.7 F (36.5 C)     Temp Source 03/03/19 2119 Oral     SpO2 03/03/19 2119 97 %     Weight 03/03/19 2105 264 lb 8.8 oz (120 kg)     Height 03/03/19 2105 5\' 6"  (1.676 m)     Head Circumference --      Peak Flow --      Pain Score 03/03/19 2105 4     Pain Loc --      Pain Edu? --      Excl. in [place]? --     Constitutional: Alert and oriented. Well appearing and in no acute distress. Eyes: Conjunctivae are normal. PERRL. EOMI. Head: Atraumatic. Nose: No congestion/rhinnorhea. Mouth/Throat: Mucous membranes are moist.  Oropharynx non-erythematous. Neck: No stridor.  No meningeal signs.    Cardiovascular: Normal rate, regular rhythm. Good peripheral circulation. Grossly normal heart sounds.   Respiratory: Normal respiratory effort.  No retractions. Lungs CTAB. Gastrointestinal: Soft and nontender. No distention.  Musculoskeletal: No lower extremity tenderness nor edema. No gross deformities of extremities. Neurologic:  Normal speech and language. No gross focal neurologic deficits are appreciated.  Skin:  Skin is warm, dry and intact. No rash noted.   ____________________________________________   LABS (all labs ordered are listed, but only abnormal results are displayed)  Labs Reviewed  BASIC METABOLIC PANEL - Abnormal; Notable for the following components:      Result Value   Glucose, Bld 114 (*)    Calcium 8.7 (*)    All other components within normal limits  CBC - Abnormal; Notable for the following components:   RBC 3.78 (*)    Hemoglobin 11.4 (*)    HCT 35.3 (*)    All other components within normal limits  TROPONIN I (HIGH SENSITIVITY) - Abnormal; Notable for the following components:   Troponin I (High Sensitivity) 741 (*)    All other components within normal limits  TROPONIN I (HIGH SENSITIVITY) - Abnormal; Notable for the following components:   Troponin I (High Sensitivity) 1,464 (*)    All other components within normal limits  SARS CORONAVIRUS 2 (TAT 6-24 HRS)  PROTIME-INR  HEPARIN LEVEL (UNFRACTIONATED)  HIV ANTIBODY (ROUTINE TESTING W REFLEX)  POC SARS CORONAVIRUS 2 AG -  ED   ____________________________________________  EKG   EKG Interpretation  Date/Time:  Tuesday March 03 2019 21:10:26 EST Ventricular Rate:  85 PR Interval:  198 QRS Duration: 100 QT Interval:  384 QTC Calculation: 456 R Axis:   99 Text Interpretation: Normal sinus rhythm Rightward axis ST & T wave abnormality, consider inferior ischemia Abnormal ECG Confirmed by Merrily Pew [phone]) on 03/03/2019 11:35:15 PM       EKG Interpretation  Date/Time:  Tuesday March 03 2019 23:36:49 EST Ventricular Rate:  74 PR Interval:  198 QRS Duration: 100 QT Interval:  417 QTC Calculation: 463 R Axis:   -31 Text Interpretation: Sinus rhythm Prolonged PR interval Abnormal R-wave progression, early transition Left ventricular hypertrophy Borderline T abnormalities, inferior leads improved depressions from earlier Confirmed by Merrily Pew [phone]) on 03/04/2019 2:08:26 AM      ____________________________________________  RADIOLOGY  DG Chest 2 View  Result Date: 03/03/2019 CLINICAL DATA:  Chest pain radiating into the left arm. EXAM: CHEST - 2 VIEW COMPARISON:  None. FINDINGS: Cardiac shadow is at the upper limits of normal in size. Aortic calcifications are seen. The lungs are clear without focal infiltrate or effusion. No  acute bony abnormality is noted. IMPRESSION: No active cardiopulmonary disease. Electronically Signed   By: Inez Catalina M.D.   On: 03/03/2019 21:29    ____________________________________________   PROCEDURES  Procedure(s) performed:   .Critical Care Performed by: Merrily Pew, MD Authorized by: Merrily Pew, MD   Critical care provider statement:    Critical care time (minutes):  31   Critical care was time spent personally by me on the following activities:  Discussions with consultants, evaluation of patient's response to treatment, examination of patient, ordering and performing treatments and interventions, ordering and review of laboratory studies, ordering and review of radiographic studies, pulse oximetry, re-evaluation of patient's condition, obtaining history from patient or surrogate and review of old charts  ____________________________________________   [place] / [place] / ED COURSE  Patient with a pretty typical story for angina.  His EKG was not consistent with STEMI but did have evidence of ischemia.  First troponin 741.  Heparin started.  Cardiology consulted for admission.   Pertinent labs  & imaging results that were available during my care of the patient were reviewed by me and considered in my medical decision making (see chart for details).  ____________________________________________  FINAL CLINICAL IMPRESSION(S) / ED DIAGNOSES  Final diagnoses:  NSTEMI (non-ST elevated myocardial infarction) ([place])    MEDICATIONS GIVEN DURING THIS VISIT:  Medications  heparin ADULT infusion 100 units/mL (25000 units/236mL sodium chloride 0.45%) (1,450 Units/hr Intravenous New Bag/Given 03/04/19 0038)  amLODipine (NORVASC) tablet 10 mg (has no administration in time range)  atorvastatin (LIPITOR) tablet 40 mg (has no administration in time range)  furosemide (LASIX) tablet 40 mg (has no administration in time range)  lisinopril (ZESTRIL) tablet 40 mg (has no administration in time range)  aspirin EC tablet 81 mg (has no administration in time range)  nitroGLYCERIN (NITROSTAT) SL tablet 0.4 mg (has no administration in time range)  acetaminophen (TYLENOL) tablet 650 mg (has no administration in time range)  ondansetron (ZOFRAN) injection 4 mg (has no administration in time range)  sodium chloride flush (NS) 0.9 % injection 3 mL (3 mLs Intravenous Given 03/03/19 2115)  heparin bolus via infusion 4,000 Units (4,000 Units Intravenous Bolus from Bag 03/04/19 0041)     NEW OUTPATIENT MEDICATIONS STARTED DURING THIS VISIT:  New Prescriptions   No medications on file    Note:  This note was prepared with assistance of Dragon voice recognition software. Occasional wrong-word or sound-a-like substitutions may have occurred due to the inherent limitations of voice recognition software.   Alik Mawson, Corene Cornea, MD 03/04/19 [phone]

## 2019-03-05 ENCOUNTER — Inpatient Hospital Stay (HOSPITAL_COMMUNITY): Payer: Medicare HMO

## 2019-03-05 ENCOUNTER — Other Ambulatory Visit: Payer: Self-pay | Admitting: *Deleted

## 2019-03-05 DIAGNOSIS — I2510 Atherosclerotic heart disease of native coronary artery without angina pectoris: Secondary | ICD-10-CM

## 2019-03-05 DIAGNOSIS — I25110 Atherosclerotic heart disease of native coronary artery with unstable angina pectoris: Secondary | ICD-10-CM

## 2019-03-05 DIAGNOSIS — Z01810 Encounter for preprocedural cardiovascular examination: Secondary | ICD-10-CM

## 2019-03-05 DIAGNOSIS — I352 Nonrheumatic aortic (valve) stenosis with insufficiency: Secondary | ICD-10-CM

## 2019-03-05 LAB — URINALYSIS, ROUTINE W REFLEX MICROSCOPIC
Bilirubin Urine: NEGATIVE
Glucose, UA: NEGATIVE mg/dL
Ketones, ur: 5 mg/dL — AB
Leukocytes,Ua: NEGATIVE
Nitrite: NEGATIVE
Protein, ur: NEGATIVE mg/dL
Specific Gravity, Urine: 1.018 (ref 1.005–1.030)
pH: 5 (ref 5.0–8.0)

## 2019-03-05 LAB — BLOOD GAS, ARTERIAL
Acid-Base Excess: 2 mmol/L (ref 0.0–2.0)
Bicarbonate: 25.5 mmol/L (ref 20.0–28.0)
Drawn by: 39898
FIO2: 21
O2 Saturation: 93.3 %
Patient temperature: 36.8
pCO2 arterial: 35.8 mmHg (ref 32.0–48.0)
pH, Arterial: 7.466 — ABNORMAL HIGH (ref 7.350–7.450)
pO2, Arterial: 70.8 mmHg — ABNORMAL LOW (ref 83.0–108.0)

## 2019-03-05 LAB — PULMONARY FUNCTION TEST
FEF 25-75 Pre: 2.01 L/sec
FEF2575-%Pred-Pre: 91 %
FEV1-%Pred-Pre: 83 %
FEV1-Pre: 2.4 L
FEV1FVC-%Pred-Pre: 103 %
FEV6-%Pred-Pre: 84 %
FEV6-Pre: 3.14 L
FEV6FVC-%Pred-Pre: 106 %
FVC-%Pred-Pre: 79 %
FVC-Pre: 3.14 L
Pre FEV1/FVC ratio: 76 %
Pre FEV6/FVC Ratio: 100 %

## 2019-03-05 LAB — SURGICAL PCR SCREEN
MRSA, PCR: NEGATIVE
Staphylococcus aureus: NEGATIVE

## 2019-03-05 LAB — CBC
HCT: 34.5 % — ABNORMAL LOW (ref 39.0–52.0)
Hemoglobin: 11.3 g/dL — ABNORMAL LOW (ref 13.0–17.0)
MCH: 29.8 pg (ref 26.0–34.0)
MCHC: 32.8 g/dL (ref 30.0–36.0)
MCV: 91 fL (ref 80.0–100.0)
Platelets: 226 10*3/uL (ref 150–400)
RBC: 3.79 MIL/uL — ABNORMAL LOW (ref 4.22–5.81)
RDW: 13.3 % (ref 11.5–15.5)
WBC: 9.2 10*3/uL (ref 4.0–10.5)
nRBC: 0 % (ref 0.0–0.2)

## 2019-03-05 LAB — ABO/RH: ABO/RH(D): B POS

## 2019-03-05 LAB — BASIC METABOLIC PANEL
Anion gap: 12 (ref 5–15)
BUN: 10 mg/dL (ref 8–23)
CO2: 24 mmol/L (ref 22–32)
Calcium: 8.4 mg/dL — ABNORMAL LOW (ref 8.9–10.3)
Chloride: 103 mmol/L (ref 98–111)
Creatinine, Ser: 0.72 mg/dL (ref 0.61–1.24)
GFR calc Af Amer: >60 mL/min (ref >60)
GFR calc non Af Amer: >60 mL/min (ref >60)
Glucose, Bld: 110 mg/dL — ABNORMAL HIGH (ref 70–99)
Potassium: 3.8 mmol/L (ref 3.5–5.1)
Sodium: 139 mmol/L (ref 135–145)

## 2019-03-05 LAB — HEMOGLOBIN A1C
Hgb A1c MFr Bld: 5.6 % (ref 4.8–5.6)
Mean Plasma Glucose: 114.02 mg/dL

## 2019-03-05 LAB — TYPE AND SCREEN
ABO/RH(D): B POS
Antibody Screen: NEGATIVE

## 2019-03-05 LAB — APTT: aPTT: 69 seconds — ABNORMAL HIGH (ref 24–36)

## 2019-03-05 LAB — HEPARIN LEVEL (UNFRACTIONATED): Heparin Unfractionated: 0.28 IU/mL — ABNORMAL LOW (ref 0.30–0.70)

## 2019-03-05 MED ORDER — MILRINONE LACTATE IN DEXTROSE 20-5 MG/100ML-% IV SOLN
0.3000 ug/kg/min | INTRAVENOUS | Status: DC
  Filled 2019-03-05: qty 100

## 2019-03-05 MED ORDER — EPINEPHRINE HCL 5 MG/250ML IV SOLN IN NS
0.0000 ug/min | INTRAVENOUS | Status: DC
  Filled 2019-03-05: qty 250

## 2019-03-05 MED ORDER — NOREPINEPHRINE 4 MG/250ML-% IV SOLN
0.0000 ug/min | INTRAVENOUS | Status: DC
  Filled 2019-03-05: qty 250

## 2019-03-05 MED ORDER — PHENYLEPHRINE HCL-NACL 20-0.9 MG/250ML-% IV SOLN
30.0000 ug/min | INTRAVENOUS | Status: AC
  Administered 2019-03-06: 50 ug/min via INTRAVENOUS
  Filled 2019-03-05: qty 250

## 2019-03-05 MED ORDER — TRANEXAMIC ACID (OHS) BOLUS VIA INFUSION
15.0000 mg/kg | INTRAVENOUS | Status: AC
  Administered 2019-03-06: 1635 mg via INTRAVENOUS
  Filled 2019-03-05: qty 1635

## 2019-03-05 MED ORDER — SODIUM CHLORIDE 0.9 % IV SOLN
INTRAVENOUS | Status: DC
  Filled 2019-03-05: qty 30

## 2019-03-05 MED ORDER — METOPROLOL TARTRATE 12.5 MG HALF TABLET
12.5000 mg | ORAL_TABLET | Freq: Once | ORAL | Status: AC
  Administered 2019-03-06: 12.5 mg via ORAL
  Filled 2019-03-05: qty 1

## 2019-03-05 MED ORDER — TEMAZEPAM 15 MG PO CAPS: 15.0000 mg | ORAL_CAPSULE | Freq: Once | ORAL | Status: DC | PRN

## 2019-03-05 MED ORDER — SODIUM CHLORIDE 0.9 % IV SOLN
750.0000 mg | INTRAVENOUS | Status: AC
  Administered 2019-03-06: 750 mg via INTRAVENOUS
  Filled 2019-03-05: qty 750

## 2019-03-05 MED ORDER — VANCOMYCIN HCL 1500 MG/300ML IV SOLN
1500.0000 mg | INTRAVENOUS | Status: AC
  Administered 2019-03-06: 1500 mg via INTRAVENOUS
  Filled 2019-03-05: qty 300

## 2019-03-05 MED ORDER — CHLORHEXIDINE GLUCONATE CLOTH 2 % EX PADS
6.0000 | MEDICATED_PAD | Freq: Once | CUTANEOUS | Status: AC
  Administered 2019-03-05: 6 via TOPICAL

## 2019-03-05 MED ORDER — DEXMEDETOMIDINE HCL IN NACL 400 MCG/100ML IV SOLN
0.1000 ug/kg/h | INTRAVENOUS | Status: AC
  Administered 2019-03-06: .5 ug/kg/h via INTRAVENOUS
  Filled 2019-03-05: qty 100

## 2019-03-05 MED ORDER — CHLORHEXIDINE GLUCONATE 0.12 % MT SOLN
15.0000 mL | Freq: Once | OROMUCOSAL | Status: AC
  Administered 2019-03-06: 15 mL via OROMUCOSAL
  Filled 2019-03-05: qty 15

## 2019-03-05 MED ORDER — TRANEXAMIC ACID 1000 MG/10ML IV SOLN
1.5000 mg/kg/h | INTRAVENOUS | Status: AC
  Administered 2019-03-06: 1.5 mg/kg/h via INTRAVENOUS
  Filled 2019-03-05: qty 25

## 2019-03-05 MED ORDER — INSULIN REGULAR(HUMAN) IN NACL 100-0.9 UT/100ML-% IV SOLN
INTRAVENOUS | Status: AC
  Administered 2019-03-06: 1 [IU]/h via INTRAVENOUS
  Filled 2019-03-05: qty 100

## 2019-03-05 MED ORDER — PLASMA-LYTE 148 IV SOLN
INTRAVENOUS | Status: DC
  Filled 2019-03-05 (×2): qty 2.5

## 2019-03-05 MED ORDER — TRANEXAMIC ACID (OHS) PUMP PRIME SOLUTION
2.0000 mg/kg | INTRAVENOUS | Status: DC
  Filled 2019-03-05: qty 2.18

## 2019-03-05 MED ORDER — CHLORHEXIDINE GLUCONATE 0.12 % MT SOLN
15.0000 mL | Freq: Two times a day (BID) | OROMUCOSAL | Status: DC
  Administered 2019-03-05 (×2): 15 mL via OROMUCOSAL
  Filled 2019-03-05 (×2): qty 15

## 2019-03-05 MED ORDER — NITROGLYCERIN IN D5W 200-5 MCG/ML-% IV SOLN
2.0000 ug/min | INTRAVENOUS | Status: AC
  Administered 2019-03-06: 10 ug/min via INTRAVENOUS
  Filled 2019-03-05: qty 250

## 2019-03-05 MED ORDER — ALPRAZOLAM 0.5 MG PO TABS
0.5000 mg | ORAL_TABLET | Freq: Two times a day (BID) | ORAL | Status: DC | PRN
  Administered 2019-03-05 (×2): 0.5 mg via ORAL
  Filled 2019-03-05: qty 1
  Filled 2019-03-05: qty 2

## 2019-03-05 MED ORDER — ATORVASTATIN CALCIUM 80 MG PO TABS
80.0000 mg | ORAL_TABLET | Freq: Every day | ORAL | Status: DC
  Administered 2019-03-05 – 2019-03-20 (×15): 80 mg via ORAL
  Filled 2019-03-05 (×15): qty 1

## 2019-03-05 MED ORDER — POTASSIUM CHLORIDE 2 MEQ/ML IV SOLN
80.0000 meq | INTRAVENOUS | Status: DC
  Filled 2019-03-05: qty 40

## 2019-03-05 MED ORDER — MAGNESIUM SULFATE 50 % IJ SOLN
40.0000 meq | INTRAMUSCULAR | Status: DC
  Filled 2019-03-05: qty 9.85

## 2019-03-05 MED ORDER — SODIUM CHLORIDE 0.9 % IV SOLN
1.5000 g | INTRAVENOUS | Status: AC
  Administered 2019-03-06: 1.5 g via INTRAVENOUS
  Filled 2019-03-05: qty 1.5

## 2019-03-05 MED ORDER — CHLORHEXIDINE GLUCONATE CLOTH 2 % EX PADS: 6.0000 | MEDICATED_PAD | Freq: Once | CUTANEOUS | Status: DC

## 2019-03-05 MED ORDER — BISACODYL 5 MG PO TBEC
5.0000 mg | DELAYED_RELEASE_TABLET | Freq: Once | ORAL | Status: AC
  Administered 2019-03-05: 5 mg via ORAL
  Filled 2019-03-05: qty 1

## 2019-03-05 MED FILL — Heparin Sodium (Porcine) Inj 1000 Unit/ML: INTRAMUSCULAR | Qty: 30 | Status: AC

## 2019-03-05 NOTE — Consult Note (Addendum)
[address]       [place] [address]             [phone]        Gurley J X[place] Medical Record [id number] Date of Birth: 1950-09-21  Referring: No ref. provider found Primary Care: [name], MD Primary Cardiologist:James Hochrein, MD  Chief Complaint:    Chief Complaint  Patient presents with  . Chest Pain    History of Present Illness:      [name] is a 69 year old male patient with a past medical history significant for essential hypertension, moderate aortic valve insufficiency, moderate aortic valve stenosis, ETOH abuse, chronic lymphedema, chronic back pain, morbid obesity, hyperlipidemia, obsturctive sleep apnea, insulin resistance, and exposure to COVID 19 (with recent negative test on 3/3) who presented to [facility] with a chief complaint of chest pain with assicated nausea but no vomiting or diaphoresis. The pain did not radiate. Vitals were stable in the ED but high-sensitivity troponins were 741 and 1464 respectfully. EKG suspicious for MI. He underwent a cardiac cath on 3/3 which revealed: Proximal RCA lesion of 70%, mid RCA lesion of 95%, RV branch lesion of 80%, proximal circumflex lesion of 40%, proximal to mid LAD lesion of 90%, and mid LAD lesion of 70%.  Due to his complex LAD disease and multivessel disease coronary artery bypass grafting was recommended.  An echocardiogram was also performed which showed a left ventricular ejection fraction of 45 to 50%.  His aortic valve had severe regurgitation and moderate stenosis.  He plans to have a cardiac CT today to evaluate his aortic root and aorta. We are consulted for possible surgical intervention for his aortic valve disease and coronary disease.   The patient shares that he does have some ambulation issues due to chronic knee pain.  He does use a cane with ambulation.  He still endorses working an 8-hour day without issues.  Him and his wife expressed interest in  cardiac rehab post procedure.    Current Activity/ Functional Status: Patient was independent with mobility/ambulation, transfers, ADL's, IADL's.   Zubrod Score: At the time of surgery this patient's most appropriate activity status/level should be described as: []     0    Normal activity, no symptoms []     1    Restricted in physical strenuous activity but ambulatory, able to do out light work [x]     2    Ambulatory and capable of self care, unable to do work activities, up and about                 more than 50%  Of the time                            []     3    Only limited self care, in bed greater than 50% of waking hours []     4    Completely disabled, no self care, confined to bed or chair []     5    Moribund  Past Medical History:  Diagnosis Date  . Alcohol abuse   . Aortic valve disease    AI and AS, Moderate  . Chest pain 03/2019  . Drug use   . Edema of both lower extremities   . Glaucoma   . Hypertension   . OSA (obstructive sleep apnea)   . Shingles  Past Surgical History:  Procedure Laterality Date  . CATARACT EXTRACTION    . LEFT HEART CATH AND CORONARY ANGIOGRAPHY N/A 03/04/2019   Procedure: LEFT HEART CATH AND CORONARY ANGIOGRAPHY;  Surgeon: Burnell Blanks, MD;  Location: [facility];  Service: Cardiovascular;  Laterality: N/A;  . None      Social History   Tobacco Use  Smoking Status Never Smoker  Smokeless Tobacco Never Used    Social History   Substance and Sexual Activity  Alcohol Use Yes  . Alcohol/week: 5.0 standard drinks  . Types: 5 Cans of beer per week     No Known Allergies  Current Facility-Administered Medications  Medication Dose Route Frequency Provider Last Rate Last Admin  . 0.9 %  sodium chloride infusion  250 mL Intravenous PRN Burnell Blanks, MD      . acetaminophen (TYLENOL) tablet 650 mg  650 mg Oral Q4H PRN Burnell Blanks, MD      . amLODipine (NORVASC) tablet 10 mg  10 mg Oral Daily  Burnell Blanks, MD   10 mg at 03/04/19 [id number]  . aspirin EC tablet 81 mg  81 mg Oral Daily Burnell Blanks, MD   81 mg at 03/04/19 1156  . atorvastatin (LIPITOR) tablet 80 mg  80 mg Oral Daily Furth, Cadence H, PA-C      . furosemide (LASIX) tablet 40 mg  40 mg Oral Daily Burnell Blanks, MD   40 mg at 03/04/19 0923  . heparin ADULT infusion 100 units/mL (25000 units/252mL sodium chloride 0.45%)  1,650 Units/hr Intravenous Continuous Donato Heinz, MD 16.5 mL/hr at 03/05/19 0038 1,650 Units/hr at 03/05/19 0038  . lisinopril (ZESTRIL) tablet 40 mg  40 mg Oral Daily Burnell Blanks, MD   40 mg at 03/04/19 [id number]  . nitroGLYCERIN (NITROSTAT) SL tablet 0.4 mg  0.4 mg Sublingual Q5 Min x 3 PRN Burnell Blanks, MD      . ondansetron [facility]) injection 4 mg  4 mg Intravenous Q6H PRN Burnell Blanks, MD      . sodium chloride flush (NS) 0.9 % injection 3 mL  3 mL Intravenous Q12H Lauree Chandler D, MD      . sodium chloride flush (NS) 0.9 % injection 3 mL  3 mL Intravenous Q12H Burnell Blanks, MD   3 mL at 03/04/19 2144  . sodium chloride flush (NS) 0.9 % injection 3 mL  3 mL Intravenous PRN Burnell Blanks, MD        Medications Prior to Admission  Medication Sig Dispense Refill Last Dose  . ALPRAZolam (XANAX) 0.5 MG tablet Take 1 tablet (0.5 mg total) by mouth daily as needed for anxiety (30 min prior to plane flight or prn anxiety). 20 tablet 0   . amLODipine (NORVASC) 10 MG tablet Take 1 tablet by mouth once daily (Patient taking differently: Take 10 mg by mouth daily. ) 90 tablet 0 03/03/2019 at Unknown time  . atorvastatin (LIPITOR) 40 MG tablet Take 1 tablet by mouth once daily (Patient taking differently: Take 40 mg by mouth every morning. ) 90 tablet 2 03/03/2019 at Unknown time  . diclofenac (VOLTAREN) 75 MG EC tablet Take 1 tablet by mouth twice daily (Patient taking differently: Take 75 mg by mouth daily. ) 30 tablet 0  03/03/2019 at Unknown time  . furosemide (LASIX) 40 MG tablet Take 1 tablet by mouth once daily (Patient taking differently: Take 40 mg by mouth daily. ) 90  tablet 0 03/03/2019 at Unknown time  . latanoprost (XALATAN) 0.005 % ophthalmic solution Place 1 drop into both eyes every morning.    03/03/2019 at Unknown time  . lisinopril (ZESTRIL) 40 MG tablet Take 1 tablet by mouth once daily (Patient taking differently: Take 40 mg by mouth daily. ) 90 tablet 0 03/03/2019 at Unknown time  . Misc Natural Products (OSTEO BI-FLEX/5-LOXIN ADVANCED PO) Take 2 tablets by mouth daily.    03/03/2019 at Unknown time  . potassium chloride SA (KLOR-CON) 20 MEQ tablet Take 1 tablet by mouth once daily (Patient taking differently: Take 20 mEq by mouth daily. ) 90 tablet 1 03/03/2019 at Unknown time  . cyclobenzaprine (FLEXERIL) 10 MG tablet Take 1 tablet (10 mg total) by mouth at bedtime as needed for muscle spasms. (Patient not taking: Reported on 03/04/2019) 15 tablet 0 Not Taking at Unknown time    Family History  Adopted: Yes  Problem Relation Age of Onset  . Cancer Mother        breast cancer  . Alcohol abuse Father   . Breast cancer Sister      Review of Systems:   Review of Systems  Constitutional: Positive for malaise/fatigue. Negative for chills, diaphoresis, fever and weight loss.  HENT: Negative.   Respiratory: Negative for cough, shortness of breath and wheezing.   Cardiovascular: Positive for chest pain and leg swelling.  Gastrointestinal: Positive for nausea. Negative for abdominal pain, constipation and vomiting.  Musculoskeletal: Positive for back pain and joint pain.  Neurological: Positive for weakness (with ambulation due to knee issues).  Psychiatric/Behavioral: The patient is nervous/anxious.    Pertinent items are noted in HPI.     Physical Exam: BP (!) 129/52 (BP Location: Right Arm)   Pulse 82   Temp 98.3 F (36.8 C) (Oral)   Resp 18   Ht 5\' 7"  (1.702 m)   Wt 109 kg   SpO2 95%   BMI  37.62 kg/m    General appearance: alert, cooperative and no distress Resp: clear to auscultation bilaterally Cardio: regular rate and rhythm, S1, S2 normal, no murmur, click, rub or gallop GI: soft, non-tender; bowel sounds normal; no masses,  no organomegaly Extremities: 1+ pitting edema in his lower extremity Neurologic: Grossly normal     Recent Radiology Findings:   DG Chest 2 View  Result Date: 03/03/2019 CLINICAL DATA:  Chest pain radiating into the left arm. EXAM: CHEST - 2 VIEW COMPARISON:  None. FINDINGS: Cardiac shadow is at the upper limits of normal in size. Aortic calcifications are seen. The lungs are clear without focal infiltrate or effusion. No acute bony abnormality is noted. IMPRESSION: No active cardiopulmonary disease. Electronically Signed   By: Inez Catalina M.D.   On: 03/03/2019 21:29   CARDIAC CATHETERIZATION  Result Date: 03/04/2019  Prox RCA lesion is 70% stenosed.  Mid RCA lesion is 95% stenosed.  RV Branch lesion is 80% stenosed.  Prox Cx lesion is 40% stenosed.  Prox LAD to Mid LAD lesion is 90% stenosed.  Mid LAD lesion is 70% stenosed.  1. Severe calcific disease in the mid LAD 2. The Circumflex has mild to moderate proximal stenosis 3. The RCA is a large dominant vessel with severe proximal stenosis and severe mid stenosis with poor flow into the distal vessel. Recommendations: He has complex disease in the mid LAD, diffuse complex disease in the proximal and mid RCA and moderate AI/AS. I suspect that his aortic root is enlarged.. Echo pending today. He  is known to have at least moderate AS and AI by prior echo. I think he would benefit from surgical AVR and bypass of the LAD and RCA. We will arrange a cardiac CTA tomorrow to better assess his aortic root and ascending aorta. I have called CT surgery tonight and they will see him tomorrow.   ECHOCARDIOGRAM COMPLETE  Result Date: 03/04/2019    ECHOCARDIOGRAM REPORT   Patient Name:   Gregory [name] [facility] Date of  Exam: 03/04/2019 Medical Rec #:  [id number]         Height:       66.0 in Accession #:    [id number]        Weight:       264.6 lb Date of Birth:  Nov 28, 1950         BSA:          2.252 m Patient Age:    76 years          BP:           143/52 mmHg Patient Gender: M                 HR:           73 bpm. Exam Location:  Inpatient Procedure: 2D Echo Indications:    Chest Pain 786.50 / R07.9  History:        Patient has prior history of Echocardiogram examinations, most                 recent 08/18/2018. Aortic Valve Disease; Risk Factors:Prediabetes                 and Sleep Apnea. History of COVID 19. ETOH abuse. Chronic                 lymphedema, chronic exertional dyspnea.  Sonographer:    Roseanna Rainbow RDCS Referring Phys: [id number] [place]  Sonographer Comments: Technically difficult study due to poor echo windows and patient is morbidly obese. Image acquisition challenging due to patient body habitus. IMPRESSIONS  1. Left ventricular ejection fraction, by estimation, is 45 to 50%. The left ventricle has mildly decreased function. The left ventricle demonstrates regional wall motion abnormalities, hypokinesis of the apical septal wall and the apex, hypokinesis of the basal inferior wall. There is moderate left ventricular hypertrophy. Left ventricular diastolic parameters are consistent with Grade I diastolic dysfunction (impaired relaxation).  2. Right ventricular systolic function is normal. The right ventricular size is normal. Tricuspid regurgitation signal is inadequate for assessing PA pressure.  3. Left atrial size was mildly dilated.  4. The mitral valve is normal in structure and function. No evidence of mitral valve regurgitation. No evidence of mitral stenosis.  5. The aortic valve is tricuspid. Aortic valve regurgitation is severe. Moderate aortic valve stenosis. Mean gradient across the aortic valve is 40 mmHg with AVA, however, calculated to 1.7 cm^2. I think that there is severe aortic  insufficiency in the setting of a dilated aortic root with high flow from AI leading to elevated gradient across the aortic valve (probably no more than moderate stenosis).  6. Aortic dilatation noted. 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  7. The inferior vena cava is normal in size with <50% respiratory variability, suggesting right atrial pressure of 8 mmHg. FINDINGS  Left Ventricle: Left ventricular ejection fraction, by estimation, is 45 to 50%. The left ventricle has mildly decreased function. The left ventricle demonstrates regional wall  motion abnormalities. Definity contrast agent was given IV to delineate the left ventricular endocardial borders. The left ventricular internal cavity size was normal in size. There is moderate left ventricular hypertrophy. Left ventricular diastolic parameters are consistent with Grade I diastolic dysfunction (impaired relaxation). Right Ventricle: The right ventricular size is normal. No increase in right ventricular wall thickness. Right ventricular systolic function is normal. Tricuspid regurgitation signal is inadequate for assessing PA pressure. Left Atrium: Left atrial size was mildly dilated. Right Atrium: Right atrial size was normal in size. Pericardium: There is no evidence of pericardial effusion. Mitral Valve: The mitral valve is normal in structure and function. There is mild calcification of the mitral valve leaflet(s). Mild to moderate mitral annular calcification. No evidence of mitral valve regurgitation. No evidence of mitral valve stenosis. Tricuspid Valve: The tricuspid valve is normal in structure. Tricuspid valve regurgitation is not demonstrated. Aortic Valve: The aortic valve is tricuspid. Aortic valve regurgitation is severe. Aortic regurgitation PHT measures 228 msec. Moderate aortic stenosis is present. Aortic valve mean gradient measures 40.0 mmHg. Aortic valve peak gradient measures 62.0 mmHg. Aortic valve area, by VTI  measures 1.86 cm. Pulmonic Valve: The pulmonic valve was normal in structure. Pulmonic valve regurgitation is not visualized. Aorta: Aortic dilatation noted. 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 Venous: The inferior vena cava is normal in size with less than 50% respiratory variability, suggesting right atrial pressure of 8 mmHg. IAS/Shunts: No atrial level shunt detected by color flow Doppler.  LEFT VENTRICLE PLAX 2D LVIDd:         5.40 cm      Diastology LVIDs:         3.80 cm      LV e' lateral:   6.85 cm/s LV PW:         1.90 cm      LV E/e' lateral: 16.1 LV IVS:        1.80 cm      LV e' medial:    6.42 cm/s LVOT diam:     2.30 cm      LV E/e' medial:  17.1 LV SV:         182 LV SV Index:   81 LVOT Area:     4.15 cm  LV Volumes (MOD) LV vol d, MOD A2C: 236.0 ml LV vol d, MOD A4C: 262.0 ml LV vol s, MOD A2C: 140.0 ml LV vol s, MOD A4C: 143.0 ml LV SV MOD A2C:     96.0 ml LV SV MOD A4C:     262.0 ml LV SV MOD BP:      120.0 ml RIGHT VENTRICLE             IVC RV S prime:     11.30 cm/s  IVC diam: 2.00 cm TAPSE (M-mode): 2.5 cm LEFT ATRIUM             Index       RIGHT ATRIUM           Index LA diam:        4.00 cm 1.78 cm/m  RA Area:     20.20 cm LA Vol (A2C):   83.0 ml 36.85 ml/m RA Volume:   63.80 ml  28.33 ml/m LA Vol (A4C):   61.1 ml 27.13 ml/m LA Biplane Vol: 74.5 ml 33.08 ml/m  AORTIC VALVE AV Area (Vmax):    1.84 cm AV Area (Vmean):   1.84 cm AV Area (VTI):  1.86 cm AV Vmax:           393.80 cm/s AV Vmean:          282.800 cm/s AV VTI:            0.978 m AV Peak Grad:      62.0 mmHg AV Mean Grad:      40.0 mmHg LVOT Vmax:         174.00 cm/s LVOT Vmean:        125.000 cm/s LVOT VTI:          0.439 m LVOT/AV VTI ratio: 0.45 AI PHT:            228 msec  AORTA Ao Root diam: 4.60 cm Ao Asc diam:  4.40 cm MITRAL VALVE MV Area (PHT): 2.38 cm     SHUNTS MV Decel Time: 319 msec     Systemic VTI:  0.44 m MV E velocity: 110.10 cm/s  Systemic Diam: 2.30 cm MV A velocity:  154.00 cm/s MV E/A ratio:  0.71 Loralie Champagne MD Electronically signed by Loralie Champagne MD Signature Date/Time: 03/04/2019/5:51:01 PM    Final      I have independently reviewed the above radiologic studies and discussed with the patient   Recent Lab Findings: Lab Results  Component Value Date   WBC 9.2 03/05/2019   HGB 11.3 (L) 03/05/2019   HCT 34.5 (L) 03/05/2019   PLT 226 03/05/2019   GLUCOSE 110 (H) 03/05/2019   CHOL 161 08/04/2018   TRIG 105 08/04/2018   HDL 66 08/04/2018   LDLDIRECT 151.0 01/05/2016   LDLCALC 74 08/04/2018   ALT 16 08/04/2018   AST 18 08/04/2018   NA 139 03/05/2019   K 3.8 03/05/2019   CL 103 03/05/2019   CREATININE 0.72 03/05/2019   BUN 10 03/05/2019   CO2 24 03/05/2019   TSH 2.560 08/14/2018   INR 1.1 03/03/2019   HGBA1C 5.6 08/14/2018      Assessment / Plan:      1. NSTEMI-continue asa81, sublingual nitro PRN 2. Severe aortic valve insufficiency-replacement planned 3. Moderate aortic valve stenosis-replacement planned 4. Hypertension-continue norvasc 5. Hyperlipidemia-continue lipitor 6. Obesity-heart healthy diet, education 7. Obstructive sleep apnea 8.  Anxiety-continue PRN xanax 9.  Alcohol dependence-drinks 5x a week. Wife also asking about "pot brownies" for before surgery due to his anxiety. Discouraged use.  10. May need a dental work-up due to several missing teeth and what seems to be gingivitis/abscess issue. He does have regular cleanings with the most recent being last month.   Plan: Will await the results of the cardiac CT scan. Continue pre-op workup.  I discussed mechanical versus tissue aortic valve replacement.  The patient is leaning towards a tissue valve since there is no need for lifelong Coumadin.  Coronary artery bypass grafting also discussed with the patient and his wife.  Cardiac catheterization reviewed.  The patient will most likely need outpatient cardiac rehab due to chronic mobility issues related to his knee  arthritis.  I  spent 40 minutes counseling the patient face to face.   Nicholes Rough, PA-C 03/05/2019 8:34 AM   Pt seen and examined; chart reviewed. I agree with the documentation as provided by PA Harriet Pho. 69 yo man with severe AS and complicated CAD, making clear best option for treatment SAVR/CABG. He has been cleared by dental. Would like to get noncontrast CT chest to assess ascending aorta/root due to AS/AI.  Plan OR tomorrow. He has had the opportunity to  ask questions which are answered to his apparent satisfaction. Will proceed.   Loni Delbridge Z. Orvan Seen, [place]

## 2019-03-05 NOTE — Progress Notes (Addendum)
Progress Note  Patient Name: Gregory [name] Date of Encounter: 03/05/2019  Primary Cardiologist: [name], MD   Subjective   Cath yesterday showed disease in the mid LAD, mid RCA and moderate AI/AS/ Plan CT surgery consult. Denis further chest pain.   Inpatient Medications    Scheduled Meds:  amLODipine  10 mg Oral Daily   aspirin EC  81 mg Oral Daily   atorvastatin  40 mg Oral Daily   furosemide  40 mg Oral Daily   lisinopril  40 mg Oral Daily   sodium chloride flush  3 mL Intravenous Q12H   sodium chloride flush  3 mL Intravenous Q12H   Continuous Infusions:  sodium chloride     heparin 1,650 Units/hr (03/05/19 0038)   PRN Meds: sodium chloride, acetaminophen, nitroGLYCERIN, ondansetron (ZOFRAN) IV, sodium chloride flush   Vital Signs    Vitals:   03/04/19 1920 03/04/19 1950 03/04/19 2146 03/05/19 0447  BP:  (!) 126/51 (!) 122/51 (!) 122/53  Pulse: 73 75 80 76  Resp:  17  18  Temp:  98.4 F (36.9 C)  98.2 F (36.8 C)  TempSrc:  Oral  Oral  SpO2: 95% 95% 93% 94%  Weight:    109 kg  Height:        Intake/Output Summary (Last 24 hours) at 03/05/2019 0811 Last data filed at 03/05/2019 0658 Gross per 24 hour  Intake 1486.67 ml  Output 2325 ml  Net -838.33 ml   Last 3 Weights 03/05/2019 03/04/2019 03/03/2019  Weight (lbs) 240 lb 3.2 oz 241 lb 11.2 oz 264 lb 8.8 oz  Weight (kg) 108.954 kg 109.634 kg 120 kg      Telemetry    NSR PACs and PVCs, HR 60-70s - Personally Reviewed  ECG    NSR, 72 bpm, minimal LVH, TWI III - Personally Reviewed  Physical Exam   GEN: No acute distress.   Neck: No JVD Cardiac: RRR, + murmur, no rubs, or gallops.  Respiratory: Clear to auscultation bilaterally. GI: Soft, nontender, non-distended  MS: Trace edema; No deformity. Neuro:  Nonfocal  Psych: Normal affect   Labs    High Sensitivity Troponin:   Recent Labs  Lab 03/03/19 2113 03/03/19 2319 03/04/19 0505  TROPONINIHS 741* 1,464* 3,155*       Chemistry Recent Labs  Lab 03/03/19 2113 03/05/19 0412  NA 139 139  K 3.8 3.8  CL 106 103  CO2 24 24  GLUCOSE 114* 110*  BUN 18 10  CREATININE 0.88 0.72  CALCIUM 8.7* 8.4*  GFRNONAA >60 >60  GFRAA >60 >60  ANIONGAP 9 12     Hematology Recent Labs  Lab 03/03/19 2113 03/05/19 0412  WBC 8.4 9.2  RBC 3.78* 3.79*  HGB 11.4* 11.3*  HCT 35.3* 34.5*  MCV 93.4 91.0  MCH 30.2 29.8  MCHC 32.3 32.8  RDW 13.3 13.3  PLT 227 226    BNPNo results for input(s): BNP, PROBNP in the last 168 hours.   DDimer No results for input(s): DDIMER in the last 168 hours.   Radiology    DG Chest 2 View  Result Date: 03/03/2019 CLINICAL DATA:  Chest pain radiating into the left arm. EXAM: CHEST - 2 VIEW COMPARISON:  None. FINDINGS: Cardiac shadow is at the upper limits of normal in size. Aortic calcifications are seen. The lungs are clear without focal infiltrate or effusion. No acute bony abnormality is noted. IMPRESSION: No active cardiopulmonary disease. Electronically Signed   By: Inez Catalina  M.D.   On: 03/03/2019 21:29   CARDIAC CATHETERIZATION  Result Date: 03/04/2019  Prox RCA lesion is 70% stenosed.  Mid RCA lesion is 95% stenosed.  RV Branch lesion is 80% stenosed.  Prox Cx lesion is 40% stenosed.  Prox LAD to Mid LAD lesion is 90% stenosed.  Mid LAD lesion is 70% stenosed.  1. Severe calcific disease in the mid LAD 2. The Circumflex has mild to moderate proximal stenosis 3. The RCA is a large dominant vessel with severe proximal stenosis and severe mid stenosis with poor flow into the distal vessel. Recommendations: He has complex disease in the mid LAD, diffuse complex disease in the proximal and mid RCA and moderate AI/AS. I suspect that his aortic root is enlarged.. Echo pending today. He is known to have at least moderate AS and AI by prior echo. I think he would benefit from surgical AVR and bypass of the LAD and RCA. We will arrange a cardiac CTA tomorrow to better assess his  aortic root and ascending aorta. I have called CT surgery tonight and they will see him tomorrow.   ECHOCARDIOGRAM COMPLETE  Result Date: 03/04/2019    ECHOCARDIOGRAM REPORT   Patient Name:   Gregory [name] [facility] Date of Exam: 03/04/2019 Medical Rec #:  [id number]         Height:       66.0 in Accession #:    [id number]        Weight:       264.6 lb Date of Birth:  13-Mar-1950         BSA:          2.252 m Patient Age:    69 years          BP:           143/52 mmHg Patient Gender: M                 HR:           73 bpm. Exam Location:  Inpatient Procedure: 2D Echo Indications:    Chest Pain 786.50 / R07.9  History:        Patient has prior history of Echocardiogram examinations, most                 recent 08/18/2018. Aortic Valve Disease; Risk Factors:Prediabetes                 and Sleep Apnea. History of COVID 19. ETOH abuse. Chronic                 lymphedema, chronic exertional dyspnea.  Sonographer:    Roseanna Rainbow RDCS Referring Phys: [id number] [place]  Sonographer Comments: Technically difficult study due to poor echo windows and patient is morbidly obese. Image acquisition challenging due to patient body habitus. IMPRESSIONS  1. Left ventricular ejection fraction, by estimation, is 45 to 50%. The left ventricle has mildly decreased function. The left ventricle demonstrates regional wall motion abnormalities, hypokinesis of the apical septal wall and the apex, hypokinesis of the basal inferior wall. There is moderate left ventricular hypertrophy. Left ventricular diastolic parameters are consistent with Grade I diastolic dysfunction (impaired relaxation).  2. Right ventricular systolic function is normal. The right ventricular size is normal. Tricuspid regurgitation signal is inadequate for assessing PA pressure.  3. Left atrial size was mildly dilated.  4. The mitral valve is normal in structure and function. No evidence of mitral valve regurgitation. No evidence of mitral  stenosis.  5. The aortic valve is  tricuspid. Aortic valve regurgitation is severe. Moderate aortic valve stenosis. Mean gradient across the aortic valve is 40 mmHg with AVA, however, calculated to 1.7 cm^2. I think that there is severe aortic insufficiency in the setting of a dilated aortic root with high flow from AI leading to elevated gradient across the aortic valve (probably no more than moderate stenosis).  6. Aortic dilatation noted. 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  7. The inferior vena cava is normal in size with <50% respiratory variability, suggesting right atrial pressure of 8 mmHg. FINDINGS  Left Ventricle: Left ventricular ejection fraction, by estimation, is 45 to 50%. The left ventricle has mildly decreased function. The left ventricle demonstrates regional wall motion abnormalities. Definity contrast agent was given IV to delineate the left ventricular endocardial borders. The left ventricular internal cavity size was normal in size. There is moderate left ventricular hypertrophy. Left ventricular diastolic parameters are consistent with Grade I diastolic dysfunction (impaired relaxation). Right Ventricle: The right ventricular size is normal. No increase in right ventricular wall thickness. Right ventricular systolic function is normal. Tricuspid regurgitation signal is inadequate for assessing PA pressure. Left Atrium: Left atrial size was mildly dilated. Right Atrium: Right atrial size was normal in size. Pericardium: There is no evidence of pericardial effusion. Mitral Valve: The mitral valve is normal in structure and function. There is mild calcification of the mitral valve leaflet(s). Mild to moderate mitral annular calcification. No evidence of mitral valve regurgitation. No evidence of mitral valve stenosis. Tricuspid Valve: The tricuspid valve is normal in structure. Tricuspid valve regurgitation is not demonstrated. Aortic Valve: The aortic valve is tricuspid. Aortic valve regurgitation is  severe. Aortic regurgitation PHT measures 228 msec. Moderate aortic stenosis is present. Aortic valve mean gradient measures 40.0 mmHg. Aortic valve peak gradient measures 62.0 mmHg. Aortic valve area, by VTI measures 1.86 cm. Pulmonic Valve: The pulmonic valve was normal in structure. Pulmonic valve regurgitation is not visualized. Aorta: Aortic dilatation noted. 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 Venous: The inferior vena cava is normal in size with less than 50% respiratory variability, suggesting right atrial pressure of 8 mmHg. IAS/Shunts: No atrial level shunt detected by color flow Doppler.  LEFT VENTRICLE PLAX 2D LVIDd:         5.40 cm      Diastology LVIDs:         3.80 cm      LV e' lateral:   6.85 cm/s LV PW:         1.90 cm      LV E/e' lateral: 16.1 LV IVS:        1.80 cm      LV e' medial:    6.42 cm/s LVOT diam:     2.30 cm      LV E/e' medial:  17.1 LV SV:         182 LV SV Index:   81 LVOT Area:     4.15 cm  LV Volumes (MOD) LV vol d, MOD A2C: 236.0 ml LV vol d, MOD A4C: 262.0 ml LV vol s, MOD A2C: 140.0 ml LV vol s, MOD A4C: 143.0 ml LV SV MOD A2C:     96.0 ml LV SV MOD A4C:     262.0 ml LV SV MOD BP:      120.0 ml RIGHT VENTRICLE  IVC RV S prime:     11.30 cm/s  IVC diam: 2.00 cm TAPSE (M-mode): 2.5 cm LEFT ATRIUM             Index       RIGHT ATRIUM           Index LA diam:        4.00 cm 1.78 cm/m  RA Area:     20.20 cm LA Vol (A2C):   83.0 ml 36.85 ml/m RA Volume:   63.80 ml  28.33 ml/m LA Vol (A4C):   61.1 ml 27.13 ml/m LA Biplane Vol: 74.5 ml 33.08 ml/m  AORTIC VALVE AV Area (Vmax):    1.84 cm AV Area (Vmean):   1.84 cm AV Area (VTI):     1.86 cm AV Vmax:           393.80 cm/s AV Vmean:          282.800 cm/s AV VTI:            0.978 m AV Peak Grad:      62.0 mmHg AV Mean Grad:      40.0 mmHg LVOT Vmax:         174.00 cm/s LVOT Vmean:        125.000 cm/s LVOT VTI:          0.439 m LVOT/AV VTI ratio: 0.45 AI PHT:            228 msec  AORTA Ao  Root diam: 4.60 cm Ao Asc diam:  4.40 cm MITRAL VALVE MV Area (PHT): 2.38 cm     SHUNTS MV Decel Time: 319 msec     Systemic VTI:  0.44 m MV E velocity: 110.10 cm/s  Systemic Diam: 2.30 cm MV A velocity: 154.00 cm/s MV E/A ratio:  0.71 Loralie Champagne MD Electronically signed by Loralie Champagne MD Signature Date/Time: 03/04/2019/5:51:01 PM    Final     Cardiac Studies    Cardiac cath 03/04/19  Prox RCA lesion is 70% stenosed.  Mid RCA lesion is 95% stenosed.  RV Branch lesion is 80% stenosed.  Prox Cx lesion is 40% stenosed.  Prox LAD to Mid LAD lesion is 90% stenosed.  Mid LAD lesion is 70% stenosed.   1. Severe calcific disease in the mid LAD 2. The Circumflex has mild to moderate proximal stenosis 3. The RCA is a large dominant vessel with severe proximal stenosis and severe mid stenosis with poor flow into the distal vessel.   Recommendations: He has complex disease in the mid LAD, diffuse complex disease in the proximal and mid RCA and moderate AI/AS. I suspect that his aortic root is enlarged.. Echo pending today. He is known to have at least moderate AS and AI by prior echo. I think he would benefit from surgical AVR and bypass of the LAD and RCA. We will arrange a cardiac CTA tomorrow to better assess his aortic root and ascending aorta. I have called CT surgery tonight and they will see him tomorrow.   Coronary Diagrams  Diagnostic Dominance: Right    Echo 03/04/19 1. Left ventricular ejection fraction, by estimation, is 45 to 50%. The  left ventricle has mildly decreased function. The left ventricle  demonstrates regional wall motion abnormalities, hypokinesis of the apical  septal wall and the apex, hypokinesis of  the basal inferior wall. There is moderate left ventricular hypertrophy.  Left ventricular diastolic parameters are consistent with Grade I  diastolic dysfunction (impaired relaxation).  2. Right ventricular systolic function is normal. The right ventricular   size is normal. Tricuspid regurgitation signal is inadequate for assessing  PA pressure.  3. Left atrial size was mildly dilated.  4. The mitral valve is normal in structure and function. No evidence of  mitral valve regurgitation. No evidence of mitral stenosis.  5. The aortic valve is tricuspid. Aortic valve regurgitation is severe.  Moderate aortic valve stenosis. Mean gradient across the aortic valve is  40 mmHg with AVA, however, calculated to 1.7 cm^2. I think that there is  severe aortic insufficiency in the  setting of a dilated aortic root with high flow from AI leading to  elevated gradient across the aortic valve (probably no more than moderate  stenosis).  6. Aortic dilatation noted. There is moderate dilatation of the aortic  root measuring 46 mm.  7. The inferior vena cava is normal in size with <50% respiratory  variability, suggesting right atrial pressure of 8 mmHg.   Patient Profile     70 y.o. male with pmh of mod AS and AI, prediabetes, Vitamin D deficiency, HTN, HLD, OSA, chronic lymphedema, obesity, back pain, ETOH use, chronic exertional dyspnea, and COVID infection 2 months ago who presents to the ER for chest pain.  Assessment & Plan    NSTEMI Patient presented with chest pain. EKG with minimal ST depressions in the lateral leads and TWI in the inferior leads. HS troponin 741 > 1,464 > 3,155.  - Cath showed complex disease in the mid LAD, diffuse complex disease in the prox and mid RCA and moderate AI/AS. Recommend surgical AVR bypass of the LAD and RCA.  - Plan for cardiac CTA to better assess his aorta and CT surgery consult. Will hold on ordering given he might need other imaging for TAVR work-up.  - On IV heparin - continue aspirin and statin  - echo showed EF 45-50%, RWMA of LV, hypokinesis of the apical and septal wall and the apex, hypokinesis of basal inferior wall. Moderate LVH, G1DD, severe AR  Ischemic cardiomyopathy/Chronic diastolic HF - EF  is [id number], lower than EF in 08/2018 EF 55-60% - continue ACE - Patient appear compensated. Start low dose BB - continue home lasix  HLD - continue statin - LDL 74 08/2018, goal <70>>increase statin  AS/AI - moderate AS mean gradient 76mmHg, AV area 1.57cm2 (compared to prior echo the AVG has increased from 20 to 59mmHg) - echo this admission showed severe AR with mean gradient of 59mmHg , dilated aortic root, moderate stenosis  HTN - continue lisinopril and amlodipine - pressure stable   For questions or updates, please contact [facility] Please consult [email or web address] for contact info under        Signed, Cadence Ninfa Meeker, PA-C  03/05/2019, 8:11 AM    History and all data above reviewed.  Patient examined.  I agree with the findings as above.   No chest pain.  No SOB. The patient exam reveals COR:RRR, systolic murmur  ,  Lungs: Clear  ,  Abd: Positive bowel sounds, no rebound no guarding, Ext Right radial and femoral sites without bleeding or bruising.   .  All available labs, radiology testing, previous records reviewed. Agree with documented assessment and plan. CAD:  Films reviewed.  High grade RCA and LAD disease.  Plan CT today.  Suggesting CABG/AVR and we will evaluate the size of the root as well.   EF is mildly reduced.  NSTEMI:   Continue heparin.  Jeneen Rinks [facility]  10:56 AM  03/05/2019

## 2019-03-05 NOTE — Progress Notes (Signed)
Pt needs Cardiac CT ASAP before procedure tomorrow. RN contacted CT and confirmed CT would be performed ASAP.

## 2019-03-05 NOTE — Consult Note (Signed)
DENTAL CONSULTATION  Date of Consultation:  03/05/2019 Patient Name:   Gregory [name] Date of Birth:   [birth year] Medical Record Number: [id number]  COVID 19 SCREENING: The patient does not symptoms concerning for COVID-19 infection (Including fever, chills, cough, or new SHORTNESS OF BREATH).    VITALS: BP (!) 116/42 (BP Location: Left Arm)   Pulse 75   Temp 98.1 F (36.7 C) (Oral)   Resp 18   Ht 5\' 7"  (1.702 m)   Wt 109 kg   SpO2 95%   BMI 37.62 kg/m   CHIEF COMPLAINT: Patient referred by Dr. Orvan Seen for a dental consultation.  HPI: ALEX MENDIOLA is a 69 year old male recently diagnosed with severe aortic stenosis and coronary artery disease.  Patient with a distal aortic valve replacement and coronary artery bypass graft procedure.  Patient is now seen as part of a preheart valve surgery dental protocol examination to rule out dental infection that may affect the patient stomach health anticipated heart valve surgery.  The patient currently denies acute toothaches, swellings, or abscesses.  Patient was last seen 2-3 weeks ago for a dental cleaning. This was with Dr. Derrek Gu. Patient usually seen on an every 4- 75-month basis.  Patient denies having any partial dentures.  The patient has some dental phobia and a history of anxiety.    PROBLEM LIST: Patient Active Problem List   Diagnosis Date Noted  . NSTEMI (non-ST elevated myocardial infarction) ([place]) 03/04/2019  . Exposure to COVID-19 virus 11/24/2018  . Prediabetes 11/18/2018  . Vitamin D deficiency 09/30/2018  . Insulin resistance 09/17/2018  . Class 2 severe obesity with serious comorbidity and body mass index (BMI) of 39.0 to 39.9 in adult ([place]) 09/17/2018  . Lymphedema 01/13/2018  . Aortic valve stenosis 01/13/2018  . Grade I diastolic dysfunction [id number]  . Situational anxiety 10/30/2016  . Acute left-sided low back pain without sciatica 04/17/2016  . High cholesterol 01/12/2016  . Bilateral primary  osteoarthritis of knee 09/23/2015  . Anemia 08/06/2013  . Morbid obesity ([place]) 03/26/2011  . Aortic insufficiency 02/15/2011  . Heart murmur 01/03/2011  . Essential hypertension 10/09/2006    PMH: Past Medical History:  Diagnosis Date  . Alcohol abuse   . Aortic valve disease    AI and AS, Moderate  . Chest pain 03/2019  . Drug use   . Edema of both lower extremities   . Glaucoma   . Hypertension   . OSA (obstructive sleep apnea)   . Shingles     PSH: Past Surgical History:  Procedure Laterality Date  . CATARACT EXTRACTION    . LEFT HEART CATH AND CORONARY ANGIOGRAPHY N/A 03/04/2019   Procedure: LEFT HEART CATH AND CORONARY ANGIOGRAPHY;  Surgeon: Burnell Blanks, MD;  Location: [facility];  Service: Cardiovascular;  Laterality: N/A;  . None      ALLERGIES: No Known Allergies  MEDICATIONS: Current Facility-Administered Medications  Medication Dose Route Frequency Provider Last Rate Last Admin  . 0.9 %  sodium chloride infusion  250 mL Intravenous PRN Burnell Blanks, MD      . acetaminophen (TYLENOL) tablet 650 mg  650 mg Oral Q4H PRN Burnell Blanks, MD      . ALPRAZolam Duanne Moron) tablet 0.5 mg  0.5 mg Oral BID PRN Harriet Pho, Tessa N, PA-C   0.5 mg at 03/05/19 1024  . amLODipine (NORVASC) tablet 10 mg  10 mg Oral Daily Burnell Blanks, MD   10 mg at 03/05/19 1024  .  aspirin EC tablet 81 mg  81 mg Oral Daily Burnell Blanks, MD   81 mg at 03/05/19 1023  . atorvastatin (LIPITOR) tablet 80 mg  80 mg Oral Daily Furth, Cadence H, PA-C   80 mg at 03/05/19 1023  . furosemide (LASIX) tablet 40 mg  40 mg Oral Daily Burnell Blanks, MD   40 mg at 03/05/19 1023  . heparin ADULT infusion 100 units/mL (25000 units/229mL sodium chloride 0.45%)  1,750 Units/hr Intravenous Continuous Donato Heinz, MD 17.5 mL/hr at 03/05/19 1027 1,750 Units/hr at 03/05/19 1027  . lisinopril (ZESTRIL) tablet 40 mg  40 mg Oral Daily Burnell Blanks, MD   40 mg at 03/05/19 1024  . nitroGLYCERIN (NITROSTAT) SL tablet 0.4 mg  0.4 mg Sublingual Q5 Min x 3 PRN Burnell Blanks, MD      . ondansetron [facility]) injection 4 mg  4 mg Intravenous Q6H PRN Burnell Blanks, MD      . sodium chloride flush (NS) 0.9 % injection 3 mL  3 mL Intravenous Q12H Burnell Blanks, MD   3 mL at 03/05/19 1044  . sodium chloride flush (NS) 0.9 % injection 3 mL  3 mL Intravenous Q12H Burnell Blanks, MD   3 mL at 03/05/19 1025  . sodium chloride flush (NS) 0.9 % injection 3 mL  3 mL Intravenous PRN Burnell Blanks, MD        LABS: Lab Results  Component Value Date   WBC 9.2 03/05/2019   HGB 11.3 (L) 03/05/2019   HCT 34.5 (L) 03/05/2019   MCV 91.0 03/05/2019   PLT 226 03/05/2019      Component Value Date/Time   NA 139 03/05/2019 0412   NA 143 08/04/2018 1052   K 3.8 03/05/2019 0412   CL 103 03/05/2019 0412   CO2 24 03/05/2019 0412   GLUCOSE 110 (H) 03/05/2019 0412   GLUCOSE 92 10/25/2005 1507   BUN 10 03/05/2019 0412   BUN 15 08/04/2018 1052   CREATININE 0.72 03/05/2019 0412   CALCIUM 8.4 (L) 03/05/2019 0412   GFRNONAA >60 03/05/2019 0412   GFRAA >60 03/05/2019 0412   Lab Results  Component Value Date   INR 1.1 03/03/2019   No results found for: PTT  SOCIAL HISTORY: Social History   Socioeconomic History  . Marital status: Married    Spouse name: Cindi  . Number of children: 3  . Years of education: Not on file  . Highest education level: Not on file  Occupational History  . Occupation: Architect  Tobacco Use  . Smoking status: Never Smoker  . Smokeless tobacco: Never Used  Substance and Sexual Activity  . Alcohol use: Yes    Alcohol/week: 5.0 standard drinks    Types: 5 Cans of beer per week  . Drug use: No  . Sexual activity: Not on file  Other Topics Concern  . Not on file  Social History Narrative   Lives at home with wife.   Social Determinants of Health    Financial Resource Strain:   . Difficulty of Paying Living Expenses: Not on file  Food Insecurity:   . Worried About Charity fundraiser in the Last Year: Not on file  . Ran Out of Food in the Last Year: Not on file  Transportation Needs:   . Lack of Transportation (Medical): Not on file  . Lack of Transportation (Non-Medical): Not on file  Physical Activity:   . Days of Exercise  per Week: Not on file  . Minutes of Exercise per Session: Not on file  Stress:   . Feeling of Stress : Not on file  Social Connections:   . Frequency of Communication with Friends and Family: Not on file  . Frequency of Social Gatherings with Friends and Family: Not on file  . Attends Religious Services: Not on file  . Active Member of Clubs or Organizations: Not on file  . Attends Archivist Meetings: Not on file  . Marital Status: Not on file  Intimate Partner Violence:   . Fear of Current or Ex-Partner: Not on file  . Emotionally Abused: Not on file  . Physically Abused: Not on file  . Sexually Abused: Not on file    FAMILY HISTORY: Family History  Adopted: Yes  Problem Relation Age of Onset  . Cancer Mother        breast cancer  . Alcohol abuse Father   . Breast cancer Sister     REVIEW OF SYSTEMS: Reviewed with the patient as per History of present illness. Psych: Patient does have dental phobia with a generalized anxiety disorder.   DENTAL HISTORY: CHIEF COMPLAINT: Patient referred by Dr. Orvan Seen for a dental consultation.  HPI: NGHIA MASSER is a 69 year old male recently diagnosed with severe aortic stenosis and coronary artery disease.  Patient with a distal aortic valve replacement and coronary artery bypass graft procedure.  Patient is now seen as part of a preheart valve surgery dental protocol examination to rule out dental infection that may affect the patient stomach health anticipated heart valve surgery.  The patient currently denies acute toothaches,  swellings, or abscesses.  Patient was last seen 2-3 weeks ago for a dental cleaning. This was with Dr. Derrek Gu. Patient usually seen on an every 4- 10-month basis.  Patient denies having any partial dentures.  The patient has some dental phobia and a history of anxiety.    DENTAL EXAMINATION: GENERAL: The patient is a well-developed, well-nourished male no acute distress. HEAD AND NECK: The patient has a significant mustache and beard making access very difficult.  There is no palpable neck lymphadenopathy.  The patient denies acute TMJ symptoms. INTRAORAL EXAM: Patient has normal saliva.  I do not see any evidence of oral abscess formation.  The patient has by bilateral mandibular lingual tori DENTITION: Patient is missing tooth numbers 2, 3, 4, 15,18, 19, 20, 30, and 31.  There is an impacted tooth #16. PERIODONTAL: Patient has chronic periodontitis with plaque and calculus accumulations, gingival recession, and incipient to moderate bone loss.  No significant tooth mobility is noted. DENTAL CARIES/SUBOPTIMAL RESTORATIONS: A suboptimal dental restoration is associated with tooth #32.  This tooth has been treatment plan for a crown by patient report. ENDODONTIC: Patient currently denies acute vulvitis symptoms.  There does appear to be periapical pathology associated with the roots of tooth #15.  Tooth #8 had previous root canal therapy. CROWN AND BRIDGE: There is a crown on tooth #29.  Patient is also being treatment planned for a crown on tooth number 32 as soon as he is medically stable from the anticipated heart valve surgery.  PROSTHODONTIC: Patient denies having partial dentures. OCCLUSION: Patient has a poor occlusal scheme secondary to multiple missing teeth, supra eruption and drifting of the unopposed teeth into the edentulous areas, and lack replacing the missing teeth with dental prostheses.    RADIOGRAPHIC INTERPRETATION: Orthopantogram was taken on 03/05/2019. There are multiple  missing teeth.  There  is an impacted tooth #16.  There are multiple missing teeth.  There is supra eruption and drifting of the unopposed teeth into the edentulous areas.  Multiple dental restorations are noted.  Patient has a previous root canal therapy associated with tooth #8.   ASSESSMENTS: 1.  Severe aortic stenosis 2.  Coronary artery disease 3.  Preheart valve surgery dental protocol 4.  Suboptimal dental restoration associated with tooth #32 with need for crown restoration.   5.  Chronic periodontitis with bone loss 6.  Gingival recession 7.  Accretions-minimal 8.  Multiple missing teeth 9.  Impacted tooth #16. 10.  Supra eruption and drifting of the unopposed teeth into the edentulous areas 11.  Poor occlusal scheme and malocclusion 12.  Need for antibiotic premedication prior to invasive dental procedures after the anticipated heart valve surgery. 13.  Bilateral mandibular lingual tori 14.  Dental phobia  PLAN/RECOMMENDATIONS: 1. I discussed the risks, benefits, and complications of various treatment options with the patient in relationship to his medical and dental conditions, anticipated heart valve surgery, and risk for endocarditis. We discussed various treatment options to include no treatment, extractions with alveoloplasty, pre-prosthetic surgery as indicated, periodontal therapy, dental restorations, root canal therapy, crown and bridge therapy, implant therapy, and replacement of missing teeth as indicated. The patient currently wishes to defer any dental treatment at this time.  The patient will follow up with Dr. Orvil Feil for a crown restoration on tooth #32 once he is medically stable from the anticipated heart valve surgery.  Patient understands that he will need antibiotic premedication prior to invasive dental procedures after the heart valve surgery.  Patient did agree to use chlorhexidine rinses twice daily for the next 6 months to aid disinfection of the oral  cavity.   2. Discussion of findings with medical team and coordination of future medical and dental care as needed.    Lenn Cal, DDS

## 2019-03-05 NOTE — Progress Notes (Addendum)
ANTICOAGULATION CONSULT NOTE  Pharmacy Consult for Heparin Indication: chest pain/ACS  No Known Allergies  Patient Measurements: Height: 5\' 7"  (170.2 cm) Weight: 240 lb 3.2 oz (109 kg) IBW/kg (Calculated) : 66.1 Heparin Dosing Weight: 91.8 kg  Vital Signs: Temp: 98.3 F (36.8 C) (03/04 0750) Temp Source: Oral (03/04 0750) BP: 129/52 (03/04 0750) Pulse Rate: 82 (03/04 0750)  Labs: Recent Labs    03/03/19 2113 03/03/19 2319 03/04/19 0505 03/04/19 0731 03/05/19 0412 03/05/19 0812  HGB 11.4*  --   --   --  11.3*  --   HCT 35.3*  --   --   --  34.5*  --   PLT 227  --   --   --  226  --   LABPROT 14.0  --   --   --   --   --   INR 1.1  --   --   --   --   --   HEPARINUNFRC  --   --   --  0.16*  --  0.28*  CREATININE 0.88  --   --   --  0.72  --   TROPONINIHS 741* 1,464* 3,155*  --   --   --     Estimated Creatinine Clearance: 101.2 mL/min (by C-G formula based on SCr of 0.72 mg/dL).   Medical History: Past Medical History:  Diagnosis Date  . Alcohol abuse   . Aortic valve disease    AI and AS, Moderate  . Chest pain 03/2019  . Drug use   . Edema of both lower extremities   . Glaucoma   . Hypertension   . OSA (obstructive sleep apnea)   . Shingles     Assessment: 69 yr old male presented with NSTEMI, for which pharmacy was consulted to dose IV heparin. Patient was not on anticoagulation PTA.   Pt is S/P cath today, with findings of complex disease in mid LAD, diffuse complex disease in proximal, and mid RCA and moderate AI/AS; CT surgery to see pt re: surgical AVR and bypass of LAD and RCA. Heparin level after restart was slightly subtherapeutic at 0.28, on 1650 units/hr. Hgb 11.3, plt 226. No s/sx of bleeding or infusion issues.   Goal of Therapy:  Heparin level 0.3-0.7 units/ml Monitor platelets by anticoagulation protocol: Yes   Plan:  Increase heparin infusion to 1750 units/hr to get into goal range Monitor daily heparin level, CBC Monitor for  signs/symptoms of bleeding  Antonietta Jewel, PharmD, BCCCP Clinical Pharmacist  Phone: [phone]  Please check AMION for all [facility] phone numbers After 10:00 PM, call [place] [phone] 03/05/2019 9:44 AM

## 2019-03-05 NOTE — Progress Notes (Signed)
Pre CABG exam completed.  Preliminary results can be found under CV proc under chart review.  03/05/2019 1:55 PM  Steffon Gladu, K., RDMS, RVT

## 2019-03-06 ENCOUNTER — Inpatient Hospital Stay (HOSPITAL_COMMUNITY): Payer: Medicare HMO

## 2019-03-06 ENCOUNTER — Inpatient Hospital Stay (HOSPITAL_COMMUNITY): Payer: Medicare HMO | Admitting: Anesthesiology

## 2019-03-06 ENCOUNTER — Encounter (HOSPITAL_COMMUNITY)
Admission: EM | Disposition: A | Discharge status: Home Health | Payer: Self-pay | Source: Home / Self Care | Attending: Cardiothoracic Surgery

## 2019-03-06 DIAGNOSIS — I352 Nonrheumatic aortic (valve) stenosis with insufficiency: Secondary | ICD-10-CM

## 2019-03-06 DIAGNOSIS — I25110 Atherosclerotic heart disease of native coronary artery with unstable angina pectoris: Secondary | ICD-10-CM

## 2019-03-06 DIAGNOSIS — Z952 Presence of prosthetic heart valve: Secondary | ICD-10-CM

## 2019-03-06 HISTORY — PX: AORTIC VALVE REPLACEMENT: SHX41

## 2019-03-06 HISTORY — PX: TEE WITHOUT CARDIOVERSION: SHX5443

## 2019-03-06 HISTORY — PX: CORONARY ARTERY BYPASS GRAFT: SHX141

## 2019-03-06 HISTORY — PX: RADIAL ARTERY HARVEST: SHX5067

## 2019-03-06 LAB — CBC
HCT: 33.1 % — ABNORMAL LOW (ref 39.0–52.0)
HCT: 32.3 % — ABNORMAL LOW (ref 39.0–52.0)
HCT: 30.6 % — ABNORMAL LOW (ref 39.0–52.0)
Hemoglobin: 10.7 g/dL — ABNORMAL LOW (ref 13.0–17.0)
Hemoglobin: 10.1 g/dL — ABNORMAL LOW (ref 13.0–17.0)
Hemoglobin: 9.8 g/dL — ABNORMAL LOW (ref 13.0–17.0)
MCH: 29.9 pg (ref 26.0–34.0)
MCH: 30.1 pg (ref 26.0–34.0)
MCH: 29.9 pg (ref 26.0–34.0)
MCHC: 32.3 g/dL (ref 30.0–36.0)
MCHC: 31.3 g/dL (ref 30.0–36.0)
MCHC: 32 g/dL (ref 30.0–36.0)
MCV: 92.5 fL (ref 80.0–100.0)
MCV: 96.4 fL (ref 80.0–100.0)
MCV: 93.3 fL (ref 80.0–100.0)
Platelets: 224 10*3/uL (ref 150–400)
Platelets: 159 10*3/uL (ref 150–400)
Platelets: 165 10*3/uL (ref 150–400)
RBC: 3.58 MIL/uL — ABNORMAL LOW (ref 4.22–5.81)
RBC: 3.35 MIL/uL — ABNORMAL LOW (ref 4.22–5.81)
RBC: 3.28 MIL/uL — ABNORMAL LOW (ref 4.22–5.81)
RDW: 13.4 % (ref 11.5–15.5)
RDW: 13.2 % (ref 11.5–15.5)
RDW: 13.2 % (ref 11.5–15.5)
WBC: 9.2 10*3/uL (ref 4.0–10.5)
WBC: 19.7 10*3/uL — ABNORMAL HIGH (ref 4.0–10.5)
WBC: 13.3 10*3/uL — ABNORMAL HIGH (ref 4.0–10.5)
nRBC: 0 % (ref 0.0–0.2)
nRBC: 0 % (ref 0.0–0.2)
nRBC: 0 % (ref 0.0–0.2)

## 2019-03-06 LAB — GLUCOSE, CAPILLARY
Glucose-Capillary: 138 mg/dL — ABNORMAL HIGH (ref 70–99)
Glucose-Capillary: 141 mg/dL — ABNORMAL HIGH (ref 70–99)
Glucose-Capillary: 144 mg/dL — ABNORMAL HIGH (ref 70–99)
Glucose-Capillary: 149 mg/dL — ABNORMAL HIGH (ref 70–99)
Glucose-Capillary: 149 mg/dL — ABNORMAL HIGH (ref 70–99)
Glucose-Capillary: 159 mg/dL — ABNORMAL HIGH (ref 70–99)
Glucose-Capillary: 162 mg/dL — ABNORMAL HIGH (ref 70–99)
Glucose-Capillary: 163 mg/dL — ABNORMAL HIGH (ref 70–99)
Glucose-Capillary: 165 mg/dL — ABNORMAL HIGH (ref 70–99)
Glucose-Capillary: 169 mg/dL — ABNORMAL HIGH (ref 70–99)

## 2019-03-06 LAB — POCT I-STAT, CHEM 8
BUN: 18 mg/dL (ref 8–23)
BUN: 19 mg/dL (ref 8–23)
BUN: 17 mg/dL (ref 8–23)
BUN: 18 mg/dL (ref 8–23)
BUN: 18 mg/dL (ref 8–23)
BUN: 19 mg/dL (ref 8–23)
Calcium, Ion: 1.16 mmol/L (ref 1.15–1.40)
Calcium, Ion: 1.09 mmol/L — ABNORMAL LOW (ref 1.15–1.40)
Calcium, Ion: 1 mmol/L — ABNORMAL LOW (ref 1.15–1.40)
Calcium, Ion: 1.04 mmol/L — ABNORMAL LOW (ref 1.15–1.40)
Calcium, Ion: 1.05 mmol/L — ABNORMAL LOW (ref 1.15–1.40)
Calcium, Ion: 1.06 mmol/L — ABNORMAL LOW (ref 1.15–1.40)
Chloride: 101 mmol/L (ref 98–111)
Chloride: 101 mmol/L (ref 98–111)
Chloride: 100 mmol/L (ref 98–111)
Chloride: 101 mmol/L (ref 98–111)
Chloride: 102 mmol/L (ref 98–111)
Chloride: 102 mmol/L (ref 98–111)
Creatinine, Ser: 0.8 mg/dL (ref 0.61–1.24)
Creatinine, Ser: 0.9 mg/dL (ref 0.61–1.24)
Creatinine, Ser: 0.7 mg/dL (ref 0.61–1.24)
Creatinine, Ser: 0.8 mg/dL (ref 0.61–1.24)
Creatinine, Ser: 0.8 mg/dL (ref 0.61–1.24)
Creatinine, Ser: 0.8 mg/dL (ref 0.61–1.24)
Glucose, Bld: 111 mg/dL — ABNORMAL HIGH (ref 70–99)
Glucose, Bld: 113 mg/dL — ABNORMAL HIGH (ref 70–99)
Glucose, Bld: 106 mg/dL — ABNORMAL HIGH (ref 70–99)
Glucose, Bld: 132 mg/dL — ABNORMAL HIGH (ref 70–99)
Glucose, Bld: 143 mg/dL — ABNORMAL HIGH (ref 70–99)
Glucose, Bld: 162 mg/dL — ABNORMAL HIGH (ref 70–99)
HCT: 30 % — ABNORMAL LOW (ref 39.0–52.0)
HCT: 28 % — ABNORMAL LOW (ref 39.0–52.0)
HCT: 26 % — ABNORMAL LOW (ref 39.0–52.0)
HCT: 24 % — ABNORMAL LOW (ref 39.0–52.0)
HCT: 24 % — ABNORMAL LOW (ref 39.0–52.0)
HCT: 24 % — ABNORMAL LOW (ref 39.0–52.0)
Hemoglobin: 10.2 g/dL — ABNORMAL LOW (ref 13.0–17.0)
Hemoglobin: 9.5 g/dL — ABNORMAL LOW (ref 13.0–17.0)
Hemoglobin: 8.8 g/dL — ABNORMAL LOW (ref 13.0–17.0)
Hemoglobin: 8.2 g/dL — ABNORMAL LOW (ref 13.0–17.0)
Hemoglobin: 8.2 g/dL — ABNORMAL LOW (ref 13.0–17.0)
Hemoglobin: 8.2 g/dL — ABNORMAL LOW (ref 13.0–17.0)
Potassium: 3.7 mmol/L (ref 3.5–5.1)
Potassium: 4 mmol/L (ref 3.5–5.1)
Potassium: 4.1 mmol/L (ref 3.5–5.1)
Potassium: 4.5 mmol/L (ref 3.5–5.1)
Potassium: 5 mmol/L (ref 3.5–5.1)
Potassium: 4.6 mmol/L (ref 3.5–5.1)
Sodium: 138 mmol/L (ref 135–145)
Sodium: 137 mmol/L (ref 135–145)
Sodium: 138 mmol/L (ref 135–145)
Sodium: 135 mmol/L (ref 135–145)
Sodium: 135 mmol/L (ref 135–145)
Sodium: 138 mmol/L (ref 135–145)
TCO2: 30 mmol/L (ref 22–32)
TCO2: 27 mmol/L (ref 22–32)
TCO2: 28 mmol/L (ref 22–32)
TCO2: 27 mmol/L (ref 22–32)
TCO2: 28 mmol/L (ref 22–32)
TCO2: 24 mmol/L (ref 22–32)

## 2019-03-06 LAB — BASIC METABOLIC PANEL
Anion gap: 10 (ref 5–15)
Anion gap: 8 (ref 5–15)
BUN: 16 mg/dL (ref 8–23)
BUN: 16 mg/dL (ref 8–23)
CO2: 24 mmol/L (ref 22–32)
CO2: 21 mmol/L — ABNORMAL LOW (ref 22–32)
Calcium: 8.3 mg/dL — ABNORMAL LOW (ref 8.9–10.3)
Calcium: 8.2 mg/dL — ABNORMAL LOW (ref 8.9–10.3)
Chloride: 104 mmol/L (ref 98–111)
Chloride: 109 mmol/L (ref 98–111)
Creatinine, Ser: 0.99 mg/dL (ref 0.61–1.24)
Creatinine, Ser: 0.87 mg/dL (ref 0.61–1.24)
GFR calc Af Amer: >60 mL/min (ref >60)
GFR calc Af Amer: >60 mL/min (ref >60)
GFR calc non Af Amer: >60 mL/min (ref >60)
GFR calc non Af Amer: >60 mL/min (ref >60)
Glucose, Bld: 116 mg/dL — ABNORMAL HIGH (ref 70–99)
Glucose, Bld: 152 mg/dL — ABNORMAL HIGH (ref 70–99)
Potassium: 3.7 mmol/L (ref 3.5–5.1)
Potassium: 4.5 mmol/L (ref 3.5–5.1)
Sodium: 138 mmol/L (ref 135–145)
Sodium: 138 mmol/L (ref 135–145)

## 2019-03-06 LAB — PLATELET COUNT: Platelets: 150 10*3/uL (ref 150–400)

## 2019-03-06 LAB — POCT I-STAT 7, (LYTES, BLD GAS, ICA,H+H)
Acid-Base Excess: 2 mmol/L (ref 0.0–2.0)
Acid-base deficit: 2 mmol/L (ref 0.0–2.0)
Acid-base deficit: 3 mmol/L — ABNORMAL HIGH (ref 0.0–2.0)
Acid-base deficit: 3 mmol/L — ABNORMAL HIGH (ref 0.0–2.0)
Acid-base deficit: 4 mmol/L — ABNORMAL HIGH (ref 0.0–2.0)
Bicarbonate: 26.9 mmol/L (ref 20.0–28.0)
Bicarbonate: 23.2 mmol/L (ref 20.0–28.0)
Bicarbonate: 24.2 mmol/L (ref 20.0–28.0)
Bicarbonate: 22.3 mmol/L (ref 20.0–28.0)
Bicarbonate: 22.8 mmol/L (ref 20.0–28.0)
Calcium, Ion: 1.02 mmol/L — ABNORMAL LOW (ref 1.15–1.40)
Calcium, Ion: 1.06 mmol/L — ABNORMAL LOW (ref 1.15–1.40)
Calcium, Ion: 1.07 mmol/L — ABNORMAL LOW (ref 1.15–1.40)
Calcium, Ion: 1.19 mmol/L (ref 1.15–1.40)
Calcium, Ion: 1.2 mmol/L (ref 1.15–1.40)
HCT: 25 % — ABNORMAL LOW (ref 39.0–52.0)
HCT: 28 % — ABNORMAL LOW (ref 39.0–52.0)
HCT: 29 % — ABNORMAL LOW (ref 39.0–52.0)
HCT: 27 % — ABNORMAL LOW (ref 39.0–52.0)
HCT: 27 % — ABNORMAL LOW (ref 39.0–52.0)
Hemoglobin: 8.5 g/dL — ABNORMAL LOW (ref 13.0–17.0)
Hemoglobin: 9.5 g/dL — ABNORMAL LOW (ref 13.0–17.0)
Hemoglobin: 9.9 g/dL — ABNORMAL LOW (ref 13.0–17.0)
Hemoglobin: 9.2 g/dL — ABNORMAL LOW (ref 13.0–17.0)
Hemoglobin: 9.2 g/dL — ABNORMAL LOW (ref 13.0–17.0)
O2 Saturation: 100 %
O2 Saturation: 94 %
O2 Saturation: 93 %
O2 Saturation: 95 %
O2 Saturation: 89 %
Patient temperature: 36.2
Patient temperature: 37.3
Patient temperature: 37.5
Potassium: 4.1 mmol/L (ref 3.5–5.1)
Potassium: 4.5 mmol/L (ref 3.5–5.1)
Potassium: 4.2 mmol/L (ref 3.5–5.1)
Potassium: 4.3 mmol/L (ref 3.5–5.1)
Potassium: 4.3 mmol/L (ref 3.5–5.1)
Sodium: 139 mmol/L (ref 135–145)
Sodium: 139 mmol/L (ref 135–145)
Sodium: 139 mmol/L (ref 135–145)
Sodium: 138 mmol/L (ref 135–145)
Sodium: 139 mmol/L (ref 135–145)
TCO2: 28 mmol/L (ref 22–32)
TCO2: 25 mmol/L (ref 22–32)
TCO2: 26 mmol/L (ref 22–32)
TCO2: 24 mmol/L (ref 22–32)
TCO2: 24 mmol/L (ref 22–32)
pCO2 arterial: 43.8 mmHg (ref 32.0–48.0)
pCO2 arterial: 42.8 mmHg (ref 32.0–48.0)
pCO2 arterial: 48.4 mmHg — ABNORMAL HIGH (ref 32.0–48.0)
pCO2 arterial: 40.4 mmHg (ref 32.0–48.0)
pCO2 arterial: 49.2 mmHg — ABNORMAL HIGH (ref 32.0–48.0)
pH, Arterial: 7.396 (ref 7.350–7.450)
pH, Arterial: 7.343 — ABNORMAL LOW (ref 7.350–7.450)
pH, Arterial: 7.303 — ABNORMAL LOW (ref 7.350–7.450)
pH, Arterial: 7.351 (ref 7.350–7.450)
pH, Arterial: 7.277 — ABNORMAL LOW (ref 7.350–7.450)
pO2, Arterial: 297 mmHg — ABNORMAL HIGH (ref 83.0–108.0)
pO2, Arterial: 74 mmHg — ABNORMAL LOW (ref 83.0–108.0)
pO2, Arterial: 73 mmHg — ABNORMAL LOW (ref 83.0–108.0)
pO2, Arterial: 83 mmHg (ref 83.0–108.0)
pO2, Arterial: 66 mmHg — ABNORMAL LOW (ref 83.0–108.0)

## 2019-03-06 LAB — PROTIME-INR
INR: 1.9 — ABNORMAL HIGH (ref 0.8–1.2)
Prothrombin Time: 21.9 seconds — ABNORMAL HIGH (ref 11.4–15.2)

## 2019-03-06 LAB — ECHO INTRAOPERATIVE TEE
Height: 67 in
Weight: 3829 oz

## 2019-03-06 LAB — HEMOGLOBIN AND HEMATOCRIT, BLOOD
HCT: 24.7 % — ABNORMAL LOW (ref 39.0–52.0)
Hemoglobin: 8 g/dL — ABNORMAL LOW (ref 13.0–17.0)

## 2019-03-06 LAB — APTT: aPTT: 62 seconds — ABNORMAL HIGH (ref 24–36)

## 2019-03-06 LAB — MAGNESIUM: Magnesium: 2.8 mg/dL — ABNORMAL HIGH (ref 1.7–2.4)

## 2019-03-06 SURGERY — CORONARY ARTERY BYPASS GRAFTING (CABG)
Anesthesia: General | Site: Chest

## 2019-03-06 MED ORDER — VANCOMYCIN HCL 1000 MG IV SOLR
INTRAVENOUS | Status: AC
  Filled 2019-03-06: qty 3000

## 2019-03-06 MED ORDER — PROTAMINE SULFATE 10 MG/ML IV SOLN
INTRAVENOUS | Status: DC | PRN
  Administered 2019-03-06: 50 mg via INTRAVENOUS
  Administered 2019-03-06: 10 mg via INTRAVENOUS
  Administered 2019-03-06 (×5): 50 mg via INTRAVENOUS

## 2019-03-06 MED ORDER — PROPOFOL 10 MG/ML IV BOLUS
INTRAVENOUS | Status: DC | PRN
  Administered 2019-03-06: 40 mg via INTRAVENOUS

## 2019-03-06 MED ORDER — ACETAMINOPHEN 160 MG/5ML PO SOLN: 650.0000 mg | Freq: Once | ORAL | Status: AC

## 2019-03-06 MED ORDER — NITROGLYCERIN IN D5W 200-5 MCG/ML-% IV SOLN: 7.0000 ug/min | INTRAVENOUS | Status: DC

## 2019-03-06 MED ORDER — FENTANYL CITRATE (PF) 250 MCG/5ML IJ SOLN
INTRAMUSCULAR | Status: AC
  Filled 2019-03-06: qty 5

## 2019-03-06 MED ORDER — OXYCODONE HCL 5 MG PO TABS
5.0000 mg | ORAL_TABLET | ORAL | Status: DC | PRN
  Administered 2019-03-07 (×2): 5 mg via ORAL
  Administered 2019-03-08 – 2019-03-09 (×2): 10 mg via ORAL
  Administered 2019-03-10 – 2019-03-11 (×4): 5 mg via ORAL
  Filled 2019-03-06: qty 2
  Filled 2019-03-06 (×6): qty 1
  Filled 2019-03-06: qty 2
  Filled 2019-03-06: qty 1

## 2019-03-06 MED ORDER — VANCOMYCIN HCL IN DEXTROSE 1-5 GM/200ML-% IV SOLN
1000.0000 mg | Freq: Once | INTRAVENOUS | Status: AC
  Administered 2019-03-06: 1000 mg via INTRAVENOUS
  Filled 2019-03-06: qty 200

## 2019-03-06 MED ORDER — CHLORHEXIDINE GLUCONATE CLOTH 2 % EX PADS
6.0000 | MEDICATED_PAD | Freq: Every day | CUTANEOUS | Status: DC
  Administered 2019-03-06 – 2019-03-10 (×5): 6 via TOPICAL

## 2019-03-06 MED ORDER — BUPIVACAINE LIPOSOME 1.3 % IJ SUSP
INTRAMUSCULAR | Status: DC | PRN
  Administered 2019-03-06: 50 mL

## 2019-03-06 MED ORDER — SODIUM CHLORIDE 0.9 % IV SOLN: INTRAVENOUS | Status: DC

## 2019-03-06 MED ORDER — DEXMEDETOMIDINE HCL IN NACL 200 MCG/50ML IV SOLN
INTRAVENOUS | Status: AC
  Filled 2019-03-06: qty 50

## 2019-03-06 MED ORDER — ROCURONIUM BROMIDE 10 MG/ML (PF) SYRINGE
PREFILLED_SYRINGE | INTRAVENOUS | Status: AC
  Filled 2019-03-06: qty 30

## 2019-03-06 MED ORDER — PROTAMINE SULFATE 10 MG/ML IV SOLN
INTRAVENOUS | Status: AC
  Filled 2019-03-06: qty 50

## 2019-03-06 MED ORDER — ACETAMINOPHEN 500 MG PO TABS
1000.0000 mg | ORAL_TABLET | Freq: Four times a day (QID) | ORAL | Status: AC
  Administered 2019-03-07 – 2019-03-11 (×16): 1000 mg via ORAL
  Filled 2019-03-06 (×15): qty 2

## 2019-03-06 MED ORDER — HEPARIN SODIUM (PORCINE) 1000 UNIT/ML IJ SOLN
INTRAMUSCULAR | Status: AC
  Filled 2019-03-06: qty 1

## 2019-03-06 MED ORDER — 0.9 % SODIUM CHLORIDE (POUR BTL) OPTIME
TOPICAL | Status: DC | PRN
  Administered 2019-03-06: 5000 mL

## 2019-03-06 MED ORDER — MORPHINE SULFATE (PF) 2 MG/ML IV SOLN
1.0000 mg | INTRAVENOUS | Status: DC | PRN
  Administered 2019-03-06: 2 mg via INTRAVENOUS
  Administered 2019-03-06: 1 mg via INTRAVENOUS
  Administered 2019-03-07 (×2): 2 mg via INTRAVENOUS
  Filled 2019-03-06 (×4): qty 1

## 2019-03-06 MED ORDER — ROCURONIUM BROMIDE 10 MG/ML (PF) SYRINGE
PREFILLED_SYRINGE | INTRAVENOUS | Status: DC | PRN
  Administered 2019-03-06 (×3): 50 mg via INTRAVENOUS
  Administered 2019-03-06: 100 mg via INTRAVENOUS
  Administered 2019-03-06: 50 mg via INTRAVENOUS

## 2019-03-06 MED ORDER — CALCIUM CHLORIDE 10 % IV SOLN
1.0000 g | Freq: Once | INTRAVENOUS | Status: AC
  Administered 2019-03-06: 1 g via INTRAVENOUS

## 2019-03-06 MED ORDER — SODIUM CHLORIDE 0.45 % IV SOLN: INTRAVENOUS | Status: DC | PRN

## 2019-03-06 MED ORDER — DOPAMINE-DEXTROSE 3.2-5 MG/ML-% IV SOLN
0.0000 ug/kg/min | INTRAVENOUS | Status: DC
  Administered 2019-03-06: 5 ug/kg/min via INTRAVENOUS
  Filled 2019-03-06 (×2): qty 250

## 2019-03-06 MED ORDER — ASPIRIN EC 325 MG PO TBEC
325.0000 mg | DELAYED_RELEASE_TABLET | Freq: Every day | ORAL | Status: DC
  Administered 2019-03-07 – 2019-03-12 (×6): 325 mg via ORAL
  Filled 2019-03-06 (×6): qty 1

## 2019-03-06 MED ORDER — ESMOLOL HCL 100 MG/10ML IV SOLN
INTRAVENOUS | Status: AC
  Filled 2019-03-06: qty 10

## 2019-03-06 MED ORDER — NON FORMULARY
Status: DC | PRN
  Administered 2019-03-06: 40 mL via SURGICAL_CAVITY

## 2019-03-06 MED ORDER — DOPAMINE-DEXTROSE 3.2-5 MG/ML-% IV SOLN
3.0000 ug/kg/min | INTRAVENOUS | Status: DC
  Administered 2019-03-07: 3 ug/kg/min via INTRAVENOUS
  Filled 2019-03-06: qty 250

## 2019-03-06 MED ORDER — LACTATED RINGERS IV SOLN: INTRAVENOUS | Status: DC

## 2019-03-06 MED ORDER — MIDAZOLAM HCL (PF) 10 MG/2ML IJ SOLN
INTRAMUSCULAR | Status: AC
  Filled 2019-03-06: qty 2

## 2019-03-06 MED ORDER — ALBUMIN HUMAN 5 % IV SOLN
250.0000 mL | INTRAVENOUS | Status: AC | PRN
  Administered 2019-03-06 (×2): 12.5 g via INTRAVENOUS
  Filled 2019-03-06: qty 500

## 2019-03-06 MED ORDER — DEXMEDETOMIDINE HCL IN NACL 400 MCG/100ML IV SOLN
0.0000 ug/kg/h | INTRAVENOUS | Status: DC
  Administered 2019-03-06 (×2): 0.5 ug/kg/h via INTRAVENOUS
  Filled 2019-03-06: qty 100

## 2019-03-06 MED ORDER — BISACODYL 10 MG RE SUPP
10.0000 mg | Freq: Every day | RECTAL | Status: DC
  Filled 2019-03-06: qty 1

## 2019-03-06 MED ORDER — INSULIN REGULAR(HUMAN) IN NACL 100-0.9 UT/100ML-% IV SOLN
INTRAVENOUS | Status: DC
  Administered 2019-03-07: 2.8 [IU]/h via INTRAVENOUS

## 2019-03-06 MED ORDER — TRAMADOL HCL 50 MG PO TABS
50.0000 mg | ORAL_TABLET | ORAL | Status: DC | PRN
  Administered 2019-03-07 (×2): 100 mg via ORAL
  Administered 2019-03-08 – 2019-03-13 (×4): 50 mg via ORAL
  Administered 2019-03-13: 100 mg via ORAL
  Filled 2019-03-06 (×3): qty 1
  Filled 2019-03-06 (×2): qty 2
  Filled 2019-03-06: qty 1
  Filled 2019-03-06: qty 2

## 2019-03-06 MED ORDER — VANCOMYCIN HCL 1000 MG IV SOLR
INTRAVENOUS | Status: DC | PRN
  Administered 2019-03-06: 3 g

## 2019-03-06 MED ORDER — ACETAMINOPHEN 160 MG/5ML PO SOLN: 1000.0000 mg | Freq: Four times a day (QID) | ORAL | Status: AC

## 2019-03-06 MED ORDER — DOCUSATE SODIUM 100 MG PO CAPS
200.0000 mg | ORAL_CAPSULE | Freq: Every day | ORAL | Status: DC
  Administered 2019-03-07 – 2019-03-17 (×9): 200 mg via ORAL
  Filled 2019-03-06 (×10): qty 2

## 2019-03-06 MED ORDER — MAGNESIUM SULFATE 4 GM/100ML IV SOLN
4.0000 g | Freq: Once | INTRAVENOUS | Status: AC
  Administered 2019-03-06: 4 g via INTRAVENOUS
  Filled 2019-03-06: qty 100

## 2019-03-06 MED ORDER — STERILE WATER FOR INJECTION IJ SOLN
INTRAMUSCULAR | Status: DC | PRN
  Administered 2019-03-06: 10 mL via SURGICAL_CAVITY

## 2019-03-06 MED ORDER — HEPARIN SODIUM (PORCINE) 1000 UNIT/ML IJ SOLN
INTRAMUSCULAR | Status: DC | PRN
  Administered 2019-03-06: 38000 [IU] via INTRAVENOUS

## 2019-03-06 MED ORDER — ISOSORBIDE MONONITRATE ER 30 MG PO TB24
30.0000 mg | ORAL_TABLET | Freq: Every day | ORAL | Status: DC
  Administered 2019-03-07 – 2019-03-20 (×14): 30 mg via ORAL
  Filled 2019-03-06 (×14): qty 1

## 2019-03-06 MED ORDER — LACTATED RINGERS IV SOLN: 500.0000 mL | Freq: Once | INTRAVENOUS | Status: DC | PRN

## 2019-03-06 MED ORDER — ALBUTEROL SULFATE HFA 108 (90 BASE) MCG/ACT IN AERS
INHALATION_SPRAY | RESPIRATORY_TRACT | Status: DC | PRN
  Administered 2019-03-06: 3 via RESPIRATORY_TRACT

## 2019-03-06 MED ORDER — FENTANYL CITRATE (PF) 250 MCG/5ML IJ SOLN
INTRAMUSCULAR | Status: DC | PRN
  Administered 2019-03-06: 250 ug via INTRAVENOUS
  Administered 2019-03-06: 700 ug via INTRAVENOUS
  Administered 2019-03-06: 250 ug via INTRAVENOUS
  Administered 2019-03-06: 50 ug via INTRAVENOUS

## 2019-03-06 MED ORDER — SODIUM CHLORIDE 0.9 % IV SOLN
1.5000 g | Freq: Two times a day (BID) | INTRAVENOUS | Status: AC
  Administered 2019-03-06 – 2019-03-08 (×4): 1.5 g via INTRAVENOUS
  Filled 2019-03-06 (×4): qty 1.5

## 2019-03-06 MED ORDER — SODIUM CHLORIDE 0.9% FLUSH
3.0000 mL | Freq: Two times a day (BID) | INTRAVENOUS | Status: DC
  Administered 2019-03-07 – 2019-03-09 (×4): 3 mL via INTRAVENOUS

## 2019-03-06 MED ORDER — DEXTROSE 50 % IV SOLN: 0.0000 mL | INTRAVENOUS | Status: DC | PRN

## 2019-03-06 MED ORDER — PLASMA-LYTE 148 IV SOLN
INTRAVENOUS | Status: DC | PRN
  Administered 2019-03-06: 500 mL via INTRAVASCULAR

## 2019-03-06 MED ORDER — ONDANSETRON HCL 4 MG/2ML IJ SOLN
4.0000 mg | Freq: Four times a day (QID) | INTRAMUSCULAR | Status: DC | PRN
  Administered 2019-03-08 – 2019-03-14 (×3): 4 mg via INTRAVENOUS
  Filled 2019-03-06 (×4): qty 2

## 2019-03-06 MED ORDER — STERILE WATER FOR INJECTION IJ SOLN
INTRAMUSCULAR | Status: AC
  Filled 2019-03-06: qty 10

## 2019-03-06 MED ORDER — LACTATED RINGERS IV SOLN: INTRAVENOUS | Status: DC | PRN

## 2019-03-06 MED ORDER — PANTOPRAZOLE SODIUM 40 MG PO TBEC
40.0000 mg | DELAYED_RELEASE_TABLET | Freq: Every day | ORAL | Status: DC
  Administered 2019-03-08 – 2019-03-20 (×13): 40 mg via ORAL
  Filled 2019-03-06 (×13): qty 1

## 2019-03-06 MED ORDER — SODIUM CHLORIDE 0.9% FLUSH: 3.0000 mL | INTRAVENOUS | Status: DC | PRN

## 2019-03-06 MED ORDER — SODIUM CHLORIDE 0.9 % IV SOLN: 250.0000 mL | INTRAVENOUS | Status: DC

## 2019-03-06 MED ORDER — ORAL CARE MOUTH RINSE
15.0000 mL | OROMUCOSAL | Status: DC
  Administered 2019-03-06 (×3): 15 mL via OROMUCOSAL

## 2019-03-06 MED ORDER — ALBUMIN HUMAN 5 % IV SOLN: INTRAVENOUS | Status: DC | PRN

## 2019-03-06 MED ORDER — METOPROLOL TARTRATE 25 MG/10 ML ORAL SUSPENSION
12.5000 mg | Freq: Two times a day (BID) | ORAL | Status: DC
  Filled 2019-03-06: qty 5

## 2019-03-06 MED ORDER — METOPROLOL TARTRATE 5 MG/5ML IV SOLN: 2.5000 mg | INTRAVENOUS | Status: DC | PRN

## 2019-03-06 MED ORDER — KETOROLAC TROMETHAMINE 15 MG/ML IJ SOLN: 15.0000 mg | Freq: Once | INTRAMUSCULAR | Status: AC | PRN

## 2019-03-06 MED ORDER — BISACODYL 5 MG PO TBEC
10.0000 mg | DELAYED_RELEASE_TABLET | Freq: Every day | ORAL | Status: DC
  Administered 2019-03-07 – 2019-03-17 (×8): 10 mg via ORAL
  Filled 2019-03-06 (×11): qty 2

## 2019-03-06 MED ORDER — POTASSIUM CHLORIDE 10 MEQ/50ML IV SOLN: 10.0000 meq | INTRAVENOUS | Status: AC

## 2019-03-06 MED ORDER — PHENYLEPHRINE HCL-NACL 10-0.9 MG/250ML-% IV SOLN
INTRAVENOUS | Status: AC
  Filled 2019-03-06: qty 250

## 2019-03-06 MED ORDER — HEMOSTATIC AGENTS (NO CHARGE) OPTIME
TOPICAL | Status: DC | PRN
  Administered 2019-03-06 (×2): 1 via TOPICAL

## 2019-03-06 MED ORDER — FENTANYL CITRATE (PF) 250 MCG/5ML IJ SOLN
INTRAMUSCULAR | Status: AC
  Filled 2019-03-06: qty 20

## 2019-03-06 MED ORDER — BUPIVACAINE HCL (PF) 0.5 % IJ SOLN
INTRAMUSCULAR | Status: AC
  Filled 2019-03-06: qty 30

## 2019-03-06 MED ORDER — MIDAZOLAM HCL (PF) 5 MG/ML IJ SOLN
INTRAMUSCULAR | Status: DC | PRN
  Administered 2019-03-06: 3 mg via INTRAVENOUS
  Administered 2019-03-06: 1 mg via INTRAVENOUS
  Administered 2019-03-06: 6 mg via INTRAVENOUS

## 2019-03-06 MED ORDER — NOREPINEPHRINE 4 MG/250ML-% IV SOLN
0.0000 ug/min | INTRAVENOUS | Status: DC
  Administered 2019-03-06: 5 ug/min via INTRAVENOUS
  Administered 2019-03-07: 15 ug/min via INTRAVENOUS
  Filled 2019-03-06 (×3): qty 250

## 2019-03-06 MED ORDER — CHLORHEXIDINE GLUCONATE 0.12% ORAL RINSE (MEDLINE KIT)
15.0000 mL | Freq: Two times a day (BID) | OROMUCOSAL | Status: DC
  Administered 2019-03-06 – 2019-03-12 (×7): 15 mL via OROMUCOSAL

## 2019-03-06 MED ORDER — ACETAMINOPHEN 650 MG RE SUPP
650.0000 mg | Freq: Once | RECTAL | Status: AC
  Administered 2019-03-06: 650 mg via RECTAL

## 2019-03-06 MED ORDER — ASPIRIN 81 MG PO CHEW
324.0000 mg | CHEWABLE_TABLET | Freq: Every day | ORAL | Status: DC
  Filled 2019-03-06: qty 4

## 2019-03-06 MED ORDER — PROPOFOL 10 MG/ML IV BOLUS
INTRAVENOUS | Status: AC
  Filled 2019-03-06: qty 20

## 2019-03-06 MED ORDER — METOPROLOL TARTRATE 12.5 MG HALF TABLET
12.5000 mg | ORAL_TABLET | Freq: Two times a day (BID) | ORAL | Status: DC
  Administered 2019-03-09 – 2019-03-20 (×21): 12.5 mg via ORAL
  Filled 2019-03-06 (×22): qty 1

## 2019-03-06 MED ORDER — MIDAZOLAM HCL 2 MG/2ML IJ SOLN: 2.0000 mg | INTRAMUSCULAR | Status: DC | PRN

## 2019-03-06 MED ORDER — BUPIVACAINE LIPOSOME 1.3 % IJ SUSP
20.0000 mL | Freq: Once | INTRAMUSCULAR | Status: DC
  Filled 2019-03-06: qty 20

## 2019-03-06 MED ORDER — FAMOTIDINE IN NACL 20-0.9 MG/50ML-% IV SOLN
20.0000 mg | Freq: Two times a day (BID) | INTRAVENOUS | Status: DC
  Administered 2019-03-06: 20 mg via INTRAVENOUS

## 2019-03-06 MED ORDER — CHLORHEXIDINE GLUCONATE 0.12 % MT SOLN
15.0000 mL | OROMUCOSAL | Status: AC
  Administered 2019-03-06: 15 mL via OROMUCOSAL

## 2019-03-06 MED ORDER — PHENYLEPHRINE HCL-NACL 20-0.9 MG/250ML-% IV SOLN
0.0000 ug/min | INTRAVENOUS | Status: DC
  Administered 2019-03-06: 85 ug/min via INTRAVENOUS
  Filled 2019-03-06 (×2): qty 250

## 2019-03-06 MED ORDER — PHENYLEPHRINE HCL-NACL 10-0.9 MG/250ML-% IV SOLN
INTRAVENOUS | Status: DC | PRN
  Administered 2019-03-06: 50 ug/min via INTRAVENOUS

## 2019-03-06 MED ORDER — 0.9 % SODIUM CHLORIDE (POUR BTL) OPTIME
TOPICAL | Status: DC | PRN
  Administered 2019-03-06: 1000 mL

## 2019-03-06 MED ORDER — SODIUM CHLORIDE (PF) 0.9 % IJ SOLN
OROMUCOSAL | Status: DC | PRN
  Administered 2019-03-06 (×2): 4 mL via TOPICAL

## 2019-03-06 SURGICAL SUPPLY — 109 items
ADAPTER CARDIO PERF ANTE/RETRO (ADAPTER) ×4 IMPLANT
ADH SKN CLS APL DERMABOND .7 (GAUZE/BANDAGES/DRESSINGS) ×6
ADPR PRFSN 84XANTGRD RTRGD (ADAPTER) ×3
APPLIER CLIP 9.375 SM OPEN (CLIP) ×4
APR CLP SM 9.3 20 MLT OPN (CLIP) ×3
BAG DECANTER FOR FLEXI CONT (MISCELLANEOUS) ×4 IMPLANT
BASKET HEART (ORDER IN 25'S) (MISCELLANEOUS) ×4
BASKET HEART (ORDER IN 25S) (MISCELLANEOUS) ×3 IMPLANT
BLADE CLIPPER SURG (BLADE) ×4 IMPLANT
BLADE STERNUM SYSTEM 6 (BLADE) ×4 IMPLANT
BLADE SURG 15 STRL LF DISP TIS (BLADE) ×4 IMPLANT
BLADE SURG 15 STRL SS (BLADE) ×8
BNDG CMPR MED 15X6 ELC VLCR LF (GAUZE/BANDAGES/DRESSINGS) ×3
BNDG ELASTIC 4X5.8 VLCR STR LF (GAUZE/BANDAGES/DRESSINGS) ×4 IMPLANT
BNDG ELASTIC 6X15 VLCR STRL LF (GAUZE/BANDAGES/DRESSINGS) ×2 IMPLANT
BNDG ELASTIC 6X5.8 VLCR STR LF (GAUZE/BANDAGES/DRESSINGS) ×4 IMPLANT
BNDG GAUZE ELAST 4 BULKY (GAUZE/BANDAGES/DRESSINGS) ×4 IMPLANT
CANISTER SUCT 3000ML PPV (MISCELLANEOUS) ×4 IMPLANT
CATH CPB KIT HENDRICKSON (MISCELLANEOUS) ×4 IMPLANT
CATH ROBINSON RED A/P 18FR (CATHETERS) ×10 IMPLANT
CLIP APPLIE 9.375 SM OPEN (CLIP) ×3 IMPLANT
CLIP RETRACTION 3.0MM CORONARY (MISCELLANEOUS) ×4 IMPLANT
CONT SPEC 4OZ CLIKSEAL STRL BL (MISCELLANEOUS) ×4 IMPLANT
COVER MAYO STAND STRL (DRAPES) ×5 IMPLANT
DERMABOND ADVANCED (GAUZE/BANDAGES/DRESSINGS) ×2
DERMABOND ADVANCED .7 DNX12 (GAUZE/BANDAGES/DRESSINGS) ×3 IMPLANT
DRAIN CHANNEL 28F RND 3/8 FF (WOUND CARE) ×12 IMPLANT
DRAPE CARDIOVASCULAR INCISE (DRAPES) ×4
DRAPE HALF SHEET 40X57 (DRAPES) ×4 IMPLANT
DRAPE SLUSH/WARMER DISC (DRAPES) ×4 IMPLANT
DRAPE SRG 135X102X78XABS (DRAPES) ×3 IMPLANT
DRSG AQUACEL AG ADV 3.5X14 (GAUZE/BANDAGES/DRESSINGS) ×4 IMPLANT
ELECT CAUTERY BLADE 6.4 (BLADE) ×4 IMPLANT
ELECT REM PT RETURN 9FT ADLT (ELECTROSURGICAL) ×8
ELECTRODE REM PT RTRN 9FT ADLT (ELECTROSURGICAL) ×6 IMPLANT
FELT TEFLON 1X6 (MISCELLANEOUS) ×7 IMPLANT
FIBERTAPE STERNAL CLSR 2 36IN (Sternal Fixation) ×8 IMPLANT
FIBERTAPE STERNAL CLSR 2X36 (Sternal Fixation) ×6 IMPLANT
GAUZE SPONGE 4X4 12PLY STRL (GAUZE/BANDAGES/DRESSINGS) ×6 IMPLANT
GAUZE SPONGE 4X4 12PLY STRL LF (GAUZE/BANDAGES/DRESSINGS) ×2 IMPLANT
GEL ULTRASOUND 20GR AQUASONIC (MISCELLANEOUS) ×4 IMPLANT
GLOVE BIO SURGEON STRL SZ 6.5 (GLOVE) ×6 IMPLANT
GLOVE BIOGEL PI IND STRL 6 (GLOVE) IMPLANT
GLOVE BIOGEL PI IND STRL 7.0 (GLOVE) ×1 IMPLANT
GLOVE BIOGEL PI IND STRL 9 (GLOVE) ×1 IMPLANT
GLOVE BIOGEL PI INDICATOR 6 (GLOVE) ×1
GLOVE BIOGEL PI INDICATOR 7.0 (GLOVE) ×1
GLOVE BIOGEL PI INDICATOR 9 (GLOVE) ×1
GLOVE NEODERM STRL 7.5  LF PF (GLOVE) ×9
GLOVE NEODERM STRL 7.5 LF PF (GLOVE) ×9 IMPLANT
GLOVE SURG NEODERM 7.5  LF PF (GLOVE) ×3
GLOVE SURG SS PI 7.5 STRL IVOR (GLOVE) ×4 IMPLANT
GOWN STRL REUS W/ TWL LRG LVL3 (GOWN DISPOSABLE) ×12 IMPLANT
GOWN STRL REUS W/TWL LRG LVL3 (GOWN DISPOSABLE) ×48
HEMOSTAT POWDER SURGIFOAM 1G (HEMOSTASIS) ×8 IMPLANT
INSERT FOGARTY XLG (MISCELLANEOUS) ×2 IMPLANT
KIT BASIN OR (CUSTOM PROCEDURE TRAY) ×4 IMPLANT
KIT SUCTION CATH 14FR (SUCTIONS) ×4 IMPLANT
KIT SUT CK MINI COMBO 4X17 (Prosthesis & Implant Heart) ×2 IMPLANT
KIT TURNOVER KIT B (KITS) ×4 IMPLANT
KIT VASOVIEW HEMOPRO 2 VH 4000 (KITS) ×4 IMPLANT
LINE VENT (MISCELLANEOUS) ×1 IMPLANT
MARKER GRAFT CORONARY BYPASS (MISCELLANEOUS) ×10 IMPLANT
NDL 18GX1X1/2 (RX/OR ONLY) (NEEDLE) ×2 IMPLANT
NDL SUT PASSING CERCLAG MED (SUTURE) IMPLANT
NDL SUT PASSING CERCLAGE MED (SUTURE) ×4
NEEDLE 18GX1X1/2 (RX/OR ONLY) (NEEDLE) ×4 IMPLANT
NEEDLE SUT PASSING CERCLAG MED (SUTURE) ×3 IMPLANT
NS IRRIG 1000ML POUR BTL (IV SOLUTION) ×20 IMPLANT
PACK E OPEN HEART (SUTURE) ×4 IMPLANT
PACK OPEN HEART (CUSTOM PROCEDURE TRAY) ×4 IMPLANT
PACK SPY-PHI (KITS) ×1 IMPLANT
PAD ARMBOARD 7.5X6 YLW CONV (MISCELLANEOUS) ×8 IMPLANT
PAD ELECT DEFIB RADIOL ZOLL (MISCELLANEOUS) ×4 IMPLANT
PENCIL BUTTON HOLSTER BLD 10FT (ELECTRODE) ×4 IMPLANT
POSITIONER HEAD DONUT 9IN (MISCELLANEOUS) ×4 IMPLANT
PUNCH AORTIC ROT 4.0MM RCL 40 (MISCELLANEOUS) ×1 IMPLANT
SEALANT SURG COSEAL 8ML (VASCULAR PRODUCTS) ×1 IMPLANT
SET CANNULATION TOURNIQUET (MISCELLANEOUS) ×2 IMPLANT
SET CARDIOPLEGIA MPS 5001102 (MISCELLANEOUS) ×1 IMPLANT
SHEARS HARMONIC STRL 23CM (MISCELLANEOUS) ×4 IMPLANT
SUT BONE WAX W31G (SUTURE) ×4 IMPLANT
SUT ETHIBOND 2 0 SH (SUTURE) ×8
SUT ETHIBOND 2 0 SH 36X2 (SUTURE) ×2 IMPLANT
SUT ETHIBOND X763 2 0 SH 1 (SUTURE) ×4 IMPLANT
SUT MNCRL AB 3-0 PS2 18 (SUTURE) ×8 IMPLANT
SUT PDS AB 1 CTX 36 (SUTURE) ×8 IMPLANT
SUT PROLENE 2 0 SH DA (SUTURE) ×2 IMPLANT
SUT PROLENE 3 0 SH DA (SUTURE) ×8 IMPLANT
SUT PROLENE 4 0 RB 1 (SUTURE) ×8
SUT PROLENE 4-0 RB1 .5 CRCL 36 (SUTURE) IMPLANT
SUT PROLENE 6 0 C 1 30 (SUTURE) ×13 IMPLANT
SUT PROLENE 8 0 BV175 6 (SUTURE) ×4 IMPLANT
SUT PROLENE BLUE 7 0 (SUTURE) ×4 IMPLANT
SUT PROLENE POLY MONO (SUTURE) ×6 IMPLANT
SUT STEEL 6MS V (SUTURE) ×4 IMPLANT
SUT STEEL SZ 6 DBL 3X14 BALL (SUTURE) ×4 IMPLANT
SYR 30ML LL (SYRINGE) ×5 IMPLANT
SYR 3ML LL SCALE MARK (SYRINGE) ×4 IMPLANT
SYSTEM SAHARA CHEST DRAIN ATS (WOUND CARE) ×4 IMPLANT
TAPE CLOTH SOFT 2X10 (GAUZE/BANDAGES/DRESSINGS) ×1 IMPLANT
TAPE CLOTH SURG 4X10 WHT LF (GAUZE/BANDAGES/DRESSINGS) ×1 IMPLANT
TOWEL GREEN STERILE (TOWEL DISPOSABLE) ×4 IMPLANT
TOWEL GREEN STERILE FF (TOWEL DISPOSABLE) ×4 IMPLANT
TRAY FOLEY SLVR 16FR TEMP STAT (SET/KITS/TRAYS/PACK) ×4 IMPLANT
TUBING LAP HI FLOW INSUFFLATIO (TUBING) ×4 IMPLANT
UNDERPAD 30X30 (UNDERPADS AND DIAPERS) ×4 IMPLANT
VALVE SYSTEM EDWS INTUITY 25A (Prosthesis & Implant Heart) ×1 IMPLANT
WATER STERILE IRR 1000ML POUR (IV SOLUTION) ×8 IMPLANT

## 2019-03-06 NOTE — Procedures (Signed)
Extubation Procedure Note  Patient Details:   Name: Gregory [name] DOB: 12/27/1950 MRN: [id number]   Airway Documentation:    Vent end date: 03/06/19 Vent end time: 2100   Evaluation  O2 sats: stable throughout Complications: No apparent complications Patient did tolerate procedure well. Bilateral Breath Sounds: Clear, Diminished   Yes, pt able to cough to clear secretions. Pt had NIF -27 and VC of 6L. Pt positive for cuff leak before extubation. Placed on 4L humidified nasal cannula. Rt noticed blood clot in bottom of ETT, pt in no distress. Rt to monitor as needed.   Virgilio Frees 03/06/2019, 9:04 PM

## 2019-03-06 NOTE — Anesthesia Procedure Notes (Signed)
Procedure Name: Intubation Date/Time: 03/06/2019 7:58 AM Performed by: Mariea Clonts, CRNA Pre-anesthesia Checklist: Patient identified, Emergency Drugs available, Suction available and Patient being monitored Patient Re-evaluated:Patient Re-evaluated prior to induction Oxygen Delivery Method: Circle System Utilized Preoxygenation: Pre-oxygenation with 100% oxygen Induction Type: IV induction Ventilation: Mask ventilation without difficulty and Oral airway inserted - appropriate to patient size Laryngoscope Size: Sabra Heck and 2 Grade View: Grade I Tube type: Oral Tube size: 8.0 mm Number of attempts: 1 Airway Equipment and Method: Stylet and Oral airway Placement Confirmation: ETT inserted through vocal cords under direct vision,  positive ETCO2 and breath sounds checked- equal and bilateral Tube secured with: Tape Dental Injury: Teeth and Oropharynx as per pre-operative assessment

## 2019-03-06 NOTE — Brief Op Note (Signed)
03/03/2019 - 03/06/2019  1:00 PM  PATIENT:  Gregory [name]  69 y.o. male  PRE-OPERATIVE DIAGNOSIS:  CAD AS AI  POST-OPERATIVE DIAGNOSIS:  CAD AS  PROCEDURE:  Procedure(s) with comments: CORONARY ARTERY BYPASS GRAFTING (CABG), ON PUMP, TIMES THREE, USING BILATERAL INTERNAL MAMMARIES AND LEFT RADIAL ARTERY HARVEST (N/A) - BILATERAL IMA AORTIC VALVE REPLACEMENT (AVR), USING INTUITY 25MM (N/A) RADIAL ARTERY HARVEST (Left) TRANSESOPHAGEAL ECHOCARDIOGRAM (TEE) (N/A) INDOCYANINE GREEN FLUORESCENCE IMAGING (ICG) (N/A)  SURGEON: Wonda Olds, MD   PHYSICIAN ASSISTANT: Taino Maertens  ANESTHESIA:   general  EBL:  Per anesthesia and perfusion records   BLOOD ADMINISTERED:none  DRAINS: Mediastinal and bilateral pleural drains   LOCAL MEDICATIONS USED:  Bilateral intercostal Exparel  SPECIMEN:  Source of Specimen:  Aortic valve leaflets  DISPOSITION OF SPECIMEN:  PATHOLOGY and LAB for Culture  COUNTS:  Correct  DICTATION: .Dragon Dictation  PLAN OF CARE: Discharge to home after PACU  PATIENT DISPOSITION:  ICU - intubated and hemodynamically stable.   Delay start of Pharmacological VTE agent (>24hrs) due to surgical blood loss or risk of bleeding: yes

## 2019-03-06 NOTE — Progress Notes (Signed)
Called by RN due to tape from OR no longer sticking to pts face and ETT not properly secured. ETT resecured with pink tape at 25cm. Pt tolerated well. RT will continue to monitor.

## 2019-03-06 NOTE — Progress Notes (Signed)
  Echocardiogram Echocardiogram Transesophageal has been performed.  Gregory [name] 03/06/2019, 9:02 AM

## 2019-03-06 NOTE — Anesthesia Procedure Notes (Signed)
Central Venous Catheter Insertion Performed by: Albertha Ghee, MD, anesthesiologist Start/End3/05/2019 7:06 AM, 03/06/2019 7:14 AM Patient location: Pre-op. Preanesthetic checklist: patient identified, IV checked, site marked, risks and benefits discussed, surgical consent, monitors and equipment checked, pre-op evaluation, timeout performed and anesthesia consent Position: Trendelenburg Lidocaine 1% used for infiltration and patient sedated Hand hygiene performed , maximum sterile barriers used  and Seldinger technique used Catheter size: 9 Fr Central line and PA cath was placed.Sheath introducer Swan type:thermodilation Procedure performed using ultrasound guided technique. Ultrasound Notes:anatomy identified, needle tip was noted to be adjacent to the nerve/plexus identified, no ultrasound evidence of intravascular and/or intraneural injection and image(s) printed for medical record Attempts: 1 Following insertion, line sutured, dressing applied and Biopatch. Post procedure assessment: blood return through all ports, free fluid flow and no air  Patient tolerated the procedure well with no immediate complications.

## 2019-03-06 NOTE — Op Note (Signed)
CARDIOTHORACIC SURGERY OPERATIVE NOTE  Date of Procedure: 03/06/2019  Preoperative Diagnosis: Severe 2-vessel Coronary Artery Disease and severe aortic valve disease  Postoperative Diagnosis: Same  Procedure:    Aortic Valve replacement (25 mm [name] Intuity bovine bioprosthetic)  Coronary Artery Bypass Grafting x 3  Left Internal Mammary Artery to Distal Left Anterior Descending Coronary Artery; left radial artery to Posterior Descending Coronary Artery; pedicled RIMA to rigth posterolateral artery. Open left radial artery Harvest; bilateral IMA harvesting Completion graft surveillance with indocyanine green fluorescence imaging (SPY);  Multilevel rib block with exparel/Marcaine solution  Surgeon: B. Murvin Natal, MD  Assistant: Macarthur Critchley PA-C  Anesthesia: get  Operative Findings:  Mildly reduced left ventricular systolic function  good quality internal mammary artery conduits  good quality radial artery conduit  good quality target vessels for grafting  Well-seated aortic valve prosthesis    BRIEF CLINICAL NOTE AND INDICATIONS FOR SURGERY 69 yo man with known aortic valve disease presented with new onset chest pain earlier this week. He ruled in for a NSTEMI. This prompted left heart catheterization which demonstrated severe two-vessel coronary artery disease. Consult was then placed for CT surgery for consideration of aortic valve replacement and coronary bypass. The risks and benefits of the procedure have been explained and he wishes to proceed.   DETAILS OF THE OPERATIVE PROCEDURE  Preparation:  The patient is brought to the operating room on the above mentioned date and central monitoring was established by the anesthesia team including placement of Swan-Ganz catheter and radial arterial line. The patient is placed in the supine position on the operating table.  Intravenous antibiotics are administered. General endotracheal anesthesia is induced uneventfully. A  Foley catheter is placed.  Baseline transesophageal echocardiogram was performed.  Findings were notable for moderate to severe aortic insufficiency and mildly reduced left ventricular function  The patient's chest, abdomen, both groins, left upper extremity, and both lower extremities are prepared and draped in a sterile manner. A time out procedure is performed.   Surgical Approach and Conduit Harvest:  A median sternotomy incision was performed and the left internal mammary artery is dissected from the chest wall and prepared for bypass grafting. The left internal mammary artery is notably good quality conduit. Simultaneously, the left radial artery is obtained using open harvesting technique.  After removal of the radial artery, the surgical incision in the upper extremity is closed with absorbable suture, and the left arm is tucked at the side. Attention is turned to the right chest with a right internal mammary artery is mobilized in a standard fashion. Following systemic heparinization, both internal mammary arteries were transected distally noted to have excellent flow. The arteries were treated with solution of papaverine. Both sides of the hemisternum were injected with Exparel rel/Marcaine solution within the interspaces.   Extracorporeal Cardiopulmonary Bypass and Myocardial Protection:  The pericardium is opened. The ascending aorta is mildly dilated in appearance. The ascending aorta and the right atrium are cannulated for cardiopulmonary bypass.  Adequate heparinization is verified. A retrograde cardioplegia cannula is placed through the right atrium into the coronary sinus. A left ventricular vent is inserted through the right superior pulmonary vein.  The entire pre-bypass portion of the operation was notable for stable hemodynamics.  Cardiopulmonary bypass was begun and the surface of the heart is inspected. Distal target vessels are selected for coronary artery bypass grafting. A  cardioplegia cannula is placed in the ascending aorta.   The patient is allowed to cool passively to [facility] systemic  temperature.  The aortic cross clamp is applied and cold blood cardioplegia is delivered initially in an retrograde fashion due to the severity of aortic insufficiency. Supplemental cardioplegia is given antegrade through the aortic root, but the heart tended to distend..  Iced saline slush is applied for topical hypothermia. Repeat doses of cardioplegia are administered intermittently throughout the entire cross clamp portion of the operation through the aortic root,  through the coronary sinus catheter, and through subsequently placed bypass grafts in order to maintain completely flat electrocardiogram.   Coronary Artery Bypass Grafting and Aortic Valve Replacement:   Attention is first turned to the coronary grafting. The posterior descending branch of the right coronary artery was grafted using the radial artery graft in an end-to-side fashion.  At the site of distal anastomosis the target vessel was fair quality and measured approximately 1.5 mm in diameter. Next, the distal left anterior coronary artery was grafted with the left internal mammary artery in an end-to-side fashion.  At the site of distal anastomosis the target vessel was good quality and measured approximately 2 mm in diameter. Anastomotic patency and runoff was confirmed with indocyanine green fluorescence imaging (SPY).   Next, the aortic valve is accessed by opening the aorta transversely. The aortic valve was inspected and found to be trileaflet and heavily calcified circumferentially including at the level of the annulus. Prior to debridement hand-held cardioplegia is given down the 2 coronary ostia. The valve leaflets are excised and the annulus was carefully and thoroughly debrided. A 25 mm [name] Intuity valve sizer fit easily. 3 sutures of 2-0 Ethibond were placed at the nadir of each cusp. These were then brought  through the sewing ring of the Intuity valve after it had been prepared on the back table. The valve was then lowered into position and the sutures were tied with core knot crimping devices. The balloon on the rapid deployment valve was then expanded according to manufacturer recommendations. The valve seated well. The aortotomy was closed in layers.   The distal right coronary artery was grafted using the pedicled right internal mammary graft in an end-to-side fashion. This was performed because the posterior descending artery was totally occluded and the prior radial graft was placed distal to the occlusion. At the site of distal anastomosis the target vessel was good quality and measured approximately 2 mm in diameter.  Finally, the proximal anastomosis of the radial artery was performed onto the side of the right internal mammary artery with a running suture of 7-0 Prolene. This was done because the radial graft would not reach the aorta.Anastomotic patency and runoff was confirmed with indocyanine green fluorescence imaging (SPY). A hotshot dose of cardioplegia was given antegrade. De-airing procedures were performed and the aortic cross-clamp was removed.  Procedure Completion:  All proximal and distal coronary anastomoses were inspected for hemostasis and appropriate graft orientation. Epicardial pacing wires are fixed to the right ventricular outflow tract and to the right atrial appendage. The patient is rewarmed to 37C temperature. The patient is weaned and disconnected from cardiopulmonary bypass.  The patient's rhythm at separation from bypass was sinus bradycardia.  The patient was weaned from cardiopulmonary bypass without any inotropic support.   Followup transesophageal echocardiogram performed after separation from bypass revealed a well-seated valve and preserved biventricular function. The aortic and venous cannula were removed uneventfully. Protamine was administered to reverse the  anticoagulation. The mediastinum and pleural space were inspected for hemostasis and irrigated with saline solution. The mediastinum and bilateral pleural  space were drained using fluted chest tubes placed through separate stab incisions inferiorly.  The soft tissues anterior to the aorta were reapproximated loosely. The sternum is closed with double strength sternal wire. The soft tissues anterior to the sternum were closed in multiple layers and the skin is closed with a running subcuticular skin closure.  The post-bypass portion of the operation was notable for stable rhythm and hemodynamics.  \No blood products were administered during the operation.   Disposition:  The patient tolerated the procedure well and is transported to the surgical intensive care in stable condition. There are no intraoperative complications. All sponge instrument and needle counts are verified correct at completion of the operation.    Jayme Cloud, MD 03/06/2019 7:54 PM

## 2019-03-06 NOTE — Transfer of Care (Signed)
Immediate Anesthesia Transfer of Care Note  Patient: Gregory [name]  Procedure(s) Performed: CORONARY ARTERY BYPASS GRAFTING (CABG), ON PUMP, TIMES THREE, USING BILATERAL INTERNAL MAMMARIES AND LEFT RADIAL ARTERY HARVEST (N/A Chest) AORTIC VALVE REPLACEMENT (AVR), USING INTUITY 25MM (N/A ) RADIAL ARTERY HARVEST (Left Arm Lower) TRANSESOPHAGEAL ECHOCARDIOGRAM (TEE) (N/A ) INDOCYANINE GREEN FLUORESCENCE IMAGING (ICG) (N/A )  Patient Location: SICU  Anesthesia Type:General  Level of Consciousness: sedated and unresponsive  Airway & Oxygen Therapy: Patient remains intubated per anesthesia plan and Patient placed on Ventilator (see vital sign flow sheet for setting)  Post-op Assessment: Report given to RN and Post -op Vital signs reviewed and stable  Post vital signs: Reviewed and stable  Last Vitals:  Vitals Value Taken Time  BP    Temp 36.5 C 03/06/19 1448  Pulse 88 03/06/19 1448  Resp 12 03/06/19 1448  SpO2 94 % 03/06/19 1448  Vitals shown include unvalidated device data.  Last Pain:  Vitals:   03/06/19 0422  TempSrc: Oral  PainSc:       Patients Stated Pain Goal: 0 ([id number])  Complications: No apparent anesthesia complications

## 2019-03-06 NOTE — Progress Notes (Signed)
CT surgery p.m. Rounds  Remains sedated after surgery on full ventilator support Atrially paced with good cardiac output Requiring high-dose neo with medium dose dopamine to maintain blood pressure We will transition from neo-Synephrine to Levophed and then wean as tolerated  Minimal chest tube drainage, good urine output Postop hemoglobin 10.1

## 2019-03-06 NOTE — Anesthesia Preprocedure Evaluation (Signed)
Anesthesia Evaluation  Patient identified by MRN, date of birth, ID band Patient awake    Reviewed: Allergy & Precautions, NPO status , Patient's Chart, lab work & pertinent test results  Airway Mallampati: I  TM Distance: >3 FB Neck ROM: Full    Dental   Pulmonary sleep apnea ,    Pulmonary exam normal        Cardiovascular hypertension, Pt. on medications + Past MI  Normal cardiovascular exam+ Valvular Problems/Murmurs AI and AS      Neuro/Psych Anxiety    GI/Hepatic   Endo/Other    Renal/GU      Musculoskeletal   Abdominal   Peds  Hematology   Anesthesia Other Findings   Reproductive/Obstetrics                             Anesthesia Physical Anesthesia Plan  ASA: III  Anesthesia Plan: General   Post-op Pain Management:    Induction: Intravenous  PONV Risk Score and Plan: Treatment may vary due to age or medical condition  Airway Management Planned: Oral ETT  Additional Equipment: Arterial line, PA Cath, TEE and Ultrasound Guidance Line Placement  Intra-op Plan:   Post-operative Plan: Post-operative intubation/ventilation  Informed Consent: I have reviewed the patients History and Physical, chart, labs and discussed the procedure including the risks, benefits and alternatives for the proposed anesthesia with the patient or authorized representative who has indicated his/her understanding and acceptance.       Plan Discussed with: CRNA and Surgeon  Anesthesia Plan Comments:         Anesthesia Quick Evaluation

## 2019-03-06 NOTE — Anesthesia Procedure Notes (Addendum)
Arterial Line Insertion Start/End3/05/2019 7:20 AM, 03/06/2019 7:25 AM Performed by: Lillia Abed, MD, anesthesiologist  Preanesthetic checklist: patient identified, IV checked, risks and benefits discussed, surgical consent, monitors and equipment checked, pre-op evaluation, timeout performed and anesthesia consent Lidocaine 1% used for infiltration Right, brachial was placed Catheter size: 20 G Hand hygiene performed  and maximum sterile barriers used   Attempts: 1 Procedure performed using ultrasound guided technique. Ultrasound Notes:anatomy identified, needle tip was noted to be adjacent to the nerve/plexus identified and no ultrasound evidence of intravascular and/or intraneural injection Following insertion, dressing applied and Biopatch. Post procedure assessment: normal  Patient tolerated the procedure well with no immediate complications.

## 2019-03-06 NOTE — H&P (Signed)
History and Physical Interval Note:  03/06/2019 7:19 AM  Gregory [name]  has presented today for surgery, with the diagnosis of CAD AS AI.  The various methods of treatment have been discussed with the patient and family. After consideration of risks, benefits and other options for treatment, the patient has consented to  Procedure(s) with comments: CORONARY ARTERY BYPASS GRAFTING (CABG) (N/A) - BILATERAL IMA AORTIC VALVE REPLACEMENT (AVR) (N/A) RADIAL ARTERY HARVEST (Left) TRANSESOPHAGEAL ECHOCARDIOGRAM (TEE) (N/A) INDOCYANINE GREEN FLUORESCENCE IMAGING (ICG) (N/A) as a surgical intervention.  The patient's history has been reviewed, patient examined, no change in status, stable for surgery.  I have reviewed the patient's chart and labs.  Questions were answered to the patient's satisfaction.     Wonda Olds

## 2019-03-06 NOTE — Anesthesia Postprocedure Evaluation (Signed)
Anesthesia Post Note  Patient: Gregory [name]  Procedure(s) Performed: CORONARY ARTERY BYPASS GRAFTING (CABG), ON PUMP, TIMES THREE, USING BILATERAL INTERNAL MAMMARIES AND LEFT RADIAL ARTERY HARVEST (N/A Chest) AORTIC VALVE REPLACEMENT (AVR), USING INTUITY 25MM (N/A ) RADIAL ARTERY HARVEST (Left Arm Lower) TRANSESOPHAGEAL ECHOCARDIOGRAM (TEE) (N/A ) INDOCYANINE GREEN FLUORESCENCE IMAGING (ICG) (N/A )     Patient location during evaluation: SICU Anesthesia Type: General Level of consciousness: sedated Pain management: pain level controlled Vital Signs Assessment: post-procedure vital signs reviewed and stable Respiratory status: patient remains intubated per anesthesia plan Cardiovascular status: stable Postop Assessment: no apparent nausea or vomiting Anesthetic complications: no    Last Vitals:  Vitals:   03/06/19 1645 03/06/19 1700  BP:    Pulse: 80 80  Resp: 16 14  Temp: 36.5 C 36.5 C  SpO2: 95% 95%    Last Pain:  Vitals:   03/06/19 0422  TempSrc: Oral  PainSc:                  Jessie Schrieber DAVID

## 2019-03-07 ENCOUNTER — Inpatient Hospital Stay (HOSPITAL_COMMUNITY): Payer: Medicare HMO

## 2019-03-07 LAB — GLUCOSE, CAPILLARY
Glucose-Capillary: 159 mg/dL — ABNORMAL HIGH (ref 70–99)
Glucose-Capillary: 140 mg/dL — ABNORMAL HIGH (ref 70–99)
Glucose-Capillary: 145 mg/dL — ABNORMAL HIGH (ref 70–99)
Glucose-Capillary: 124 mg/dL — ABNORMAL HIGH (ref 70–99)
Glucose-Capillary: 125 mg/dL — ABNORMAL HIGH (ref 70–99)
Glucose-Capillary: 149 mg/dL — ABNORMAL HIGH (ref 70–99)
Glucose-Capillary: 121 mg/dL — ABNORMAL HIGH (ref 70–99)
Glucose-Capillary: 134 mg/dL — ABNORMAL HIGH (ref 70–99)
Glucose-Capillary: 131 mg/dL — ABNORMAL HIGH (ref 70–99)
Glucose-Capillary: 107 mg/dL — ABNORMAL HIGH (ref 70–99)
Glucose-Capillary: 107 mg/dL — ABNORMAL HIGH (ref 70–99)
Glucose-Capillary: 141 mg/dL — ABNORMAL HIGH (ref 70–99)

## 2019-03-07 LAB — POCT I-STAT 7, (LYTES, BLD GAS, ICA,H+H)
Acid-base deficit: 2 mmol/L (ref 0.0–2.0)
Acid-base deficit: 3 mmol/L — ABNORMAL HIGH (ref 0.0–2.0)
Bicarbonate: 22.3 mmol/L (ref 20.0–28.0)
Bicarbonate: 23.4 mmol/L (ref 20.0–28.0)
Calcium, Ion: 1.16 mmol/L (ref 1.15–1.40)
Calcium, Ion: 1.16 mmol/L (ref 1.15–1.40)
HCT: 25 % — ABNORMAL LOW (ref 39.0–52.0)
HCT: 26 % — ABNORMAL LOW (ref 39.0–52.0)
Hemoglobin: 8.5 g/dL — ABNORMAL LOW (ref 13.0–17.0)
Hemoglobin: 8.8 g/dL — ABNORMAL LOW (ref 13.0–17.0)
O2 Saturation: 91 %
O2 Saturation: 96 %
Patient temperature: 37.3
Patient temperature: 37.7
Potassium: 4 mmol/L (ref 3.5–5.1)
Potassium: 4.1 mmol/L (ref 3.5–5.1)
Sodium: 139 mmol/L (ref 135–145)
Sodium: 140 mmol/L (ref 135–145)
TCO2: 24 mmol/L (ref 22–32)
TCO2: 25 mmol/L (ref 22–32)
pCO2 arterial: 42.8 mmHg (ref 32.0–48.0)
pCO2 arterial: 44.8 mmHg (ref 32.0–48.0)
pH, Arterial: 7.328 — ABNORMAL LOW (ref 7.350–7.450)
pH, Arterial: 7.329 — ABNORMAL LOW (ref 7.350–7.450)
pO2, Arterial: 69 mmHg — ABNORMAL LOW (ref 83.0–108.0)
pO2, Arterial: 87 mmHg (ref 83.0–108.0)

## 2019-03-07 LAB — BASIC METABOLIC PANEL
Anion gap: 8 (ref 5–15)
Anion gap: 10 (ref 5–15)
BUN: 12 mg/dL (ref 8–23)
BUN: 13 mg/dL (ref 8–23)
CO2: 22 mmol/L (ref 22–32)
CO2: 23 mmol/L (ref 22–32)
Calcium: 7.9 mg/dL — ABNORMAL LOW (ref 8.9–10.3)
Calcium: 7.9 mg/dL — ABNORMAL LOW (ref 8.9–10.3)
Chloride: 110 mmol/L (ref 98–111)
Chloride: 107 mmol/L (ref 98–111)
Creatinine, Ser: 0.76 mg/dL (ref 0.61–1.24)
Creatinine, Ser: 0.77 mg/dL (ref 0.61–1.24)
GFR calc Af Amer: >60 mL/min (ref >60)
GFR calc Af Amer: >60 mL/min (ref >60)
GFR calc non Af Amer: >60 mL/min (ref >60)
GFR calc non Af Amer: >60 mL/min (ref >60)
Glucose, Bld: 142 mg/dL — ABNORMAL HIGH (ref 70–99)
Glucose, Bld: 137 mg/dL — ABNORMAL HIGH (ref 70–99)
Potassium: 4.3 mmol/L (ref 3.5–5.1)
Potassium: 4.2 mmol/L (ref 3.5–5.1)
Sodium: 140 mmol/L (ref 135–145)
Sodium: 140 mmol/L (ref 135–145)

## 2019-03-07 LAB — CBC
HCT: 29.4 % — ABNORMAL LOW (ref 39.0–52.0)
HCT: 28.3 % — ABNORMAL LOW (ref 39.0–52.0)
Hemoglobin: 9.4 g/dL — ABNORMAL LOW (ref 13.0–17.0)
Hemoglobin: 9 g/dL — ABNORMAL LOW (ref 13.0–17.0)
MCH: 29.7 pg (ref 26.0–34.0)
MCH: 30 pg (ref 26.0–34.0)
MCHC: 32 g/dL (ref 30.0–36.0)
MCHC: 31.8 g/dL (ref 30.0–36.0)
MCV: 93 fL (ref 80.0–100.0)
MCV: 94.3 fL (ref 80.0–100.0)
Platelets: 153 10*3/uL (ref 150–400)
Platelets: 146 10*3/uL — ABNORMAL LOW (ref 150–400)
RBC: 3.16 MIL/uL — ABNORMAL LOW (ref 4.22–5.81)
RBC: 3 MIL/uL — ABNORMAL LOW (ref 4.22–5.81)
RDW: 13.2 % (ref 11.5–15.5)
RDW: 13.6 % (ref 11.5–15.5)
WBC: 12 10*3/uL — ABNORMAL HIGH (ref 4.0–10.5)
WBC: 14.7 10*3/uL — ABNORMAL HIGH (ref 4.0–10.5)
nRBC: 0 % (ref 0.0–0.2)
nRBC: 0 % (ref 0.0–0.2)

## 2019-03-07 LAB — MAGNESIUM
Magnesium: 2.5 mg/dL — ABNORMAL HIGH (ref 1.7–2.4)
Magnesium: 2.3 mg/dL (ref 1.7–2.4)

## 2019-03-07 MED ORDER — ALPRAZOLAM 0.5 MG PO TABS
0.5000 mg | ORAL_TABLET | Freq: Two times a day (BID) | ORAL | Status: DC | PRN
  Administered 2019-03-08 – 2019-03-19 (×13): 0.5 mg via ORAL
  Filled 2019-03-07 (×14): qty 1

## 2019-03-07 MED ORDER — NOREPINEPHRINE 4 MG/250ML-% IV SOLN
0.0000 ug/min | INTRAVENOUS | Status: DC
  Administered 2019-03-07: 12 ug/min via INTRAVENOUS
  Administered 2019-03-07: 10 ug/min via INTRAVENOUS
  Administered 2019-03-08: 9 ug/min via INTRAVENOUS
  Administered 2019-03-08: 5 ug/min via INTRAVENOUS
  Administered 2019-03-09: 7 ug/min via INTRAVENOUS
  Administered 2019-03-09: 10 ug/min via INTRAVENOUS
  Administered 2019-03-09 – 2019-03-10 (×2): 8 ug/min via INTRAVENOUS
  Filled 2019-03-07 (×7): qty 250

## 2019-03-07 MED ORDER — INSULIN DETEMIR 100 UNIT/ML ~~LOC~~ SOLN
8.0000 [IU] | Freq: Two times a day (BID) | SUBCUTANEOUS | Status: DC
  Administered 2019-03-07 – 2019-03-20 (×27): 8 [IU] via SUBCUTANEOUS
  Filled 2019-03-07 (×29): qty 0.08

## 2019-03-07 MED ORDER — INSULIN ASPART 100 UNIT/ML ~~LOC~~ SOLN
0.0000 [IU] | SUBCUTANEOUS | Status: DC
  Administered 2019-03-07 – 2019-03-13 (×5): 2 [IU] via SUBCUTANEOUS

## 2019-03-07 MED ORDER — FUROSEMIDE 10 MG/ML IJ SOLN
20.0000 mg | Freq: Two times a day (BID) | INTRAMUSCULAR | Status: DC
  Administered 2019-03-07 – 2019-03-08 (×4): 20 mg via INTRAVENOUS
  Filled 2019-03-07 (×4): qty 2

## 2019-03-07 MED ORDER — METOLAZONE 5 MG PO TABS
5.0000 mg | ORAL_TABLET | Freq: Every day | ORAL | Status: DC
  Administered 2019-03-08 – 2019-03-09 (×2): 5 mg via ORAL
  Filled 2019-03-07 (×2): qty 1

## 2019-03-07 NOTE — Progress Notes (Signed)
CT surgery p.m. Rounds  Patient up in chair supported with high flow oxygen Receiving Lasix twice daily On mid dose dopamine and norepinephrine to mean blood pressure P.m. labs reviewed and are satisfactory-creatinine 0.8 hemoglobin 9.0 potassium 4.2

## 2019-03-07 NOTE — Progress Notes (Signed)
1 Day Post-Op Procedure(s) (LRB): CORONARY ARTERY BYPASS GRAFTING (CABG), ON PUMP, TIMES THREE, USING BILATERAL INTERNAL MAMMARIES AND LEFT RADIAL ARTERY HARVEST (N/A) AORTIC VALVE REPLACEMENT (AVR), USING INTUITY 25MM (N/A) RADIAL ARTERY HARVEST (Left) TRANSESOPHAGEAL ECHOCARDIOGRAM (TEE) (N/A) INDOCYANINE GREEN FLUORESCENCE IMAGING (ICG) (N/A) Subjective: Breathing comfortably on BiPaP CXR clear NSR, CI > 2 Objective: Vital signs in last 24 hours: Temp:  [97.2 F (36.2 C)-99.9 F (37.7 C)] 99.7 F (37.6 C) (03/06 0700) Pulse Rate:  [78-93] 80 (03/06 0724) Cardiac Rhythm: Normal sinus rhythm;Ventricular paced (03/06 0400) Resp:  [12-33] 14 (03/06 0724) BP: (116-124)/(48-51) 124/48 (03/06 0724) SpO2:  [90 %-100 %] 96 % (03/06 0724) Arterial Line BP: (102-145)/(43-70) 126/48 (03/06 0700) FiO2 (%):  [40 %-50 %] 40 % (03/06 0724) Weight:  [108.8 kg] 108.8 kg (03/06 0500)  Hemodynamic parameters for last 24 hours: PAP: (29-57)/(11-23) 35/18 CVP:  [3 mmHg-17 mmHg] 10 mmHg CO:  [6.4 L/min-9.5 L/min] 6.8 L/min CI:  [2.9 L/min/m2-4.3 L/min/m2] 3.1 L/min/m2  Intake/Output from previous day: 03/05 0701 - 03/06 0700 In: 6024.1 [I.V.:4880.6; Blood:380; IV Piggyback:763.5] Out: [id number] [Urine:2225; Blood:800; Chest Tube:350] Intake/Output this shift: No intake/output data recorded.       Exam    General- alert and comfortable    Neck- no JVD, no cervical adenopathy palpable, no carotid bruit   Lungs- clear without rales, wheezes   Cor- regular rate and rhythm, no murmur , gallop   Abdomen- soft, non-tender   Extremities - warm, non-tender, minimal edema   Neuro- oriented, appropriate, no focal weakness   Lab Results: Recent Labs    03/06/19 2001 03/06/19 2050 03/07/19 0344 03/07/19 0639  WBC 13.3*  --  12.0*  --   HGB 9.8*   < > 9.4* 8.8*  HCT 30.6*   < > 29.4* 26.0*  PLT 165  --  153  --    < > = values in this interval not displayed.   BMET:  Recent Labs     03/06/19 2001 03/06/19 2050 03/07/19 0344 03/07/19 0639  NA 138   < > 140 140  K 4.5   < > 4.3 4.0  CL 109  --  110  --   CO2 21*  --  22  --   GLUCOSE 152*  --  142*  --   BUN 16  --  12  --   CREATININE 0.87  --  0.76  --   CALCIUM 8.2*  --  7.9*  --    < > = values in this interval not displayed.    PT/INR:  Recent Labs    03/06/19 1458  LABPROT 21.9*  INR 1.9*   ABG    Component Value Date/Time   PHART 7.329 (L) 03/07/2019 0639   HCO3 22.3 03/07/2019 0639   TCO2 24 03/07/2019 0639   ACIDBASEDEF 3.0 (H) 03/07/2019 0639   O2SAT 91.0 03/07/2019 0639   CBG (last 3)  Recent Labs    03/07/19 0637 03/07/19 0817 03/07/19 0923  GLUCAP 149* 121* 134*    Assessment/Plan: S/P Procedure(s) (LRB): CORONARY ARTERY BYPASS GRAFTING (CABG), ON PUMP, TIMES THREE, USING BILATERAL INTERNAL MAMMARIES AND LEFT RADIAL ARTERY HARVEST (N/A) AORTIC VALVE REPLACEMENT (AVR), USING INTUITY 25MM (N/A) RADIAL ARTERY HARVEST (Left) TRANSESOPHAGEAL ECHOCARDIOGRAM (TEE) (N/A) INDOCYANINE GREEN FLUORESCENCE IMAGING (ICG) (N/A) Mobilize Diuresis Diabetes control See progression orders wean pressors- leave dopamine at 3 mcg today   LOS: 3 days    Gregory [name] 03/07/2019

## 2019-03-07 NOTE — Progress Notes (Signed)
Patient taken off bipap and placed on 14L salter high flow nasal cannula.  Patient currently tolerating well.  Patient states he feels slightly anxious however reassured patient that vitals are stable and numbers are within normal limits.  Patient was able to take a few small sips of water.  Will continue to monitor.

## 2019-03-08 ENCOUNTER — Inpatient Hospital Stay (HOSPITAL_COMMUNITY): Payer: Medicare HMO

## 2019-03-08 LAB — GLUCOSE, CAPILLARY
Glucose-Capillary: 103 mg/dL — ABNORMAL HIGH (ref 70–99)
Glucose-Capillary: 108 mg/dL — ABNORMAL HIGH (ref 70–99)
Glucose-Capillary: 116 mg/dL — ABNORMAL HIGH (ref 70–99)
Glucose-Capillary: 117 mg/dL — ABNORMAL HIGH (ref 70–99)
Glucose-Capillary: 124 mg/dL — ABNORMAL HIGH (ref 70–99)
Glucose-Capillary: 138 mg/dL — ABNORMAL HIGH (ref 70–99)
Glucose-Capillary: 139 mg/dL — ABNORMAL HIGH (ref 70–99)

## 2019-03-08 LAB — POCT I-STAT, CHEM 8
BUN: 16 mg/dL (ref 8–23)
Calcium, Ion: 1.15 mmol/L (ref 1.15–1.40)
Chloride: 103 mmol/L (ref 98–111)
Creatinine, Ser: 0.7 mg/dL (ref 0.61–1.24)
Glucose, Bld: 101 mg/dL — ABNORMAL HIGH (ref 70–99)
HCT: 21 % — ABNORMAL LOW (ref 39.0–52.0)
Hemoglobin: 7.1 g/dL — ABNORMAL LOW (ref 13.0–17.0)
Potassium: 3.7 mmol/L (ref 3.5–5.1)
Sodium: 137 mmol/L (ref 135–145)
TCO2: 30 mmol/L (ref 22–32)

## 2019-03-08 LAB — CBC
HCT: 26.1 % — ABNORMAL LOW (ref 39.0–52.0)
Hemoglobin: 8.1 g/dL — ABNORMAL LOW (ref 13.0–17.0)
MCH: 30.1 pg (ref 26.0–34.0)
MCHC: 31 g/dL (ref 30.0–36.0)
MCV: 97 fL (ref 80.0–100.0)
Platelets: 132 10*3/uL — ABNORMAL LOW (ref 150–400)
RBC: 2.69 MIL/uL — ABNORMAL LOW (ref 4.22–5.81)
RDW: 13.7 % (ref 11.5–15.5)
WBC: 13.1 10*3/uL — ABNORMAL HIGH (ref 4.0–10.5)
nRBC: 0 % (ref 0.0–0.2)

## 2019-03-08 LAB — BASIC METABOLIC PANEL
Anion gap: 8 (ref 5–15)
BUN: 14 mg/dL (ref 8–23)
CO2: 25 mmol/L (ref 22–32)
Calcium: 7.6 mg/dL — ABNORMAL LOW (ref 8.9–10.3)
Chloride: 106 mmol/L (ref 98–111)
Creatinine, Ser: 0.71 mg/dL (ref 0.61–1.24)
GFR calc Af Amer: >60 mL/min (ref >60)
GFR calc non Af Amer: >60 mL/min (ref >60)
Glucose, Bld: 122 mg/dL — ABNORMAL HIGH (ref 70–99)
Potassium: 4.2 mmol/L (ref 3.5–5.1)
Sodium: 139 mmol/L (ref 135–145)

## 2019-03-08 MED ORDER — AMIODARONE LOAD VIA INFUSION
150.0000 mg | Freq: Once | INTRAVENOUS | Status: AC
  Administered 2019-03-08: 150 mg via INTRAVENOUS
  Filled 2019-03-08: qty 83.34

## 2019-03-08 MED ORDER — POTASSIUM CHLORIDE CRYS ER 20 MEQ PO TBCR
20.0000 meq | EXTENDED_RELEASE_TABLET | ORAL | Status: AC
  Administered 2019-03-08 – 2019-03-09 (×3): 20 meq via ORAL
  Filled 2019-03-08 (×2): qty 1

## 2019-03-08 MED ORDER — AMIODARONE HCL IN DEXTROSE 360-4.14 MG/200ML-% IV SOLN
30.0000 mg/h | INTRAVENOUS | Status: DC
  Administered 2019-03-09 – 2019-03-11 (×5): 30 mg/h via INTRAVENOUS
  Filled 2019-03-08 (×4): qty 200

## 2019-03-08 MED ORDER — AMIODARONE HCL IN DEXTROSE 360-4.14 MG/200ML-% IV SOLN
INTRAVENOUS | Status: AC
  Filled 2019-03-08: qty 200

## 2019-03-08 MED ORDER — AMIODARONE HCL IN DEXTROSE 360-4.14 MG/200ML-% IV SOLN
60.0000 mg/h | INTRAVENOUS | Status: AC
  Administered 2019-03-08 – 2019-03-09 (×2): 60 mg/h via INTRAVENOUS
  Filled 2019-03-08 (×2): qty 200

## 2019-03-08 MED ORDER — FUROSEMIDE 10 MG/ML IJ SOLN
40.0000 mg | Freq: Two times a day (BID) | INTRAMUSCULAR | Status: DC
  Administered 2019-03-09 – 2019-03-20 (×23): 40 mg via INTRAVENOUS
  Filled 2019-03-08 (×23): qty 4

## 2019-03-08 NOTE — Progress Notes (Signed)
      [address]       [place] [address]             [phone]                 2 Days Post-Op Procedure(s) (LRB): CORONARY ARTERY BYPASS GRAFTING (CABG), ON PUMP, TIMES THREE, USING BILATERAL INTERNAL MAMMARIES AND LEFT RADIAL ARTERY HARVEST (N/A) AORTIC VALVE REPLACEMENT (AVR), USING INTUITY 25MM (N/A) RADIAL ARTERY HARVEST (Left) TRANSESOPHAGEAL ECHOCARDIOGRAM (TEE) (N/A) INDOCYANINE GREEN FLUORESCENCE IMAGING (ICG) (N/A)   Events: No events.  Doing well _______________________________________________________________ Vitals: BP (!) 107/46 Comment: A-line pressure   Pulse 72   Temp (!) 96.8 F (36 C) (Axillary)   Resp (!) 22   Ht 5\' 7"  (1.702 m)   Wt 113.8 kg   SpO2 99%   BMI 39.29 kg/m   - Neuro: alert NAD  - Cardiovascular: sinus  Drips: dop 3 levo 5.   PAP: (28-40)/(10-16) 28/10 CVP:  [2 mmHg-6 mmHg] 2 mmHg  - Pulm: clear    ABG    Component Value Date/Time   PHART 7.329 (L) 03/07/2019 0639   PCO2ART 42.8 03/07/2019 0639   PO2ART 69.0 (L) 03/07/2019 0639   HCO3 22.3 03/07/2019 0639   TCO2 24 03/07/2019 0639   ACIDBASEDEF 3.0 (H) 03/07/2019 0639   O2SAT 91.0 03/07/2019 0639    - Abd: soft - Extremity: trace edema  .Intake/Output      03/06 0701 - 03/07 0700 03/07 0701 - 03/08 0700   P.O.  240   I.V. (mL/kg) 1469.4 (12.9) 245.4 (2.2)   Blood     IV Piggyback     Total Intake(mL/kg) 1469.4 (12.9) 485.4 (4.3)   Urine (mL/kg/hr) 1750 (0.6) 575 (1.2)   Blood     Chest Tube 270 50   Total Output 2020 625   Net -550.6 -139.6           _______________________________________________________________ Labs: CBC Latest Ref Rng & Units 03/08/2019 03/07/2019 03/07/2019  WBC 4.0 - 10.5 K/uL 13.1(H) 14.7(H) -  Hemoglobin 13.0 - 17.0 g/dL 8.1(L) 9.0(L) 8.8(L)  Hematocrit 39.0 - 52.0 % 26.1(L) 28.3(L) 26.0(L)  Platelets 150 - 400 K/uL 132(L) 146(L) -   CMP Latest Ref Rng & Units 03/08/2019 03/07/2019 03/07/2019  Glucose 70 - 99 mg/dL 122(H)  137(H) -  BUN 8 - 23 mg/dL 14 13 -  Creatinine 0.61 - 1.24 mg/dL 0.71 0.77 -  Sodium 135 - 145 mmol/L 139 140 140  Potassium 3.5 - 5.1 mmol/L 4.2 4.2 4.0  Chloride 98 - 111 mmol/L 106 107 -  CO2 22 - 32 mmol/L 25 23 -  Calcium 8.9 - 10.3 mg/dL 7.6(L) 7.9(L) -  Total Protein 6.0 - 8.5 g/dL - - -  Total Bilirubin 0.0 - 1.2 mg/dL - - -  Alkaline Phos 39 - 117 IU/L - - -  AST 0 - 40 IU/L - - -  ALT 0 - 44 IU/L - - -    CXR: PV congest.  Right effusion  _______________________________________________________________  Assessment and Plan: POD 2 s/p CABG AVR.  Doing well  Neuro: pain controlled CV: on asp and statin.  Will wean dop and levo today Pulm: will remove CT tubes.  Continue pulm toilet Renal: will start diuresis once off levo GI: on liquids Heme: stable ID: [email or web address] Endo: SSI  Dispo: continue ICU care  Melodie Bouillon, MD 03/08/2019 11:24 AM

## 2019-03-09 ENCOUNTER — Encounter: Payer: Self-pay | Admitting: *Deleted

## 2019-03-09 LAB — GLUCOSE, CAPILLARY
Glucose-Capillary: 101 mg/dL — ABNORMAL HIGH (ref 70–99)
Glucose-Capillary: 103 mg/dL — ABNORMAL HIGH (ref 70–99)
Glucose-Capillary: 107 mg/dL — ABNORMAL HIGH (ref 70–99)
Glucose-Capillary: 112 mg/dL — ABNORMAL HIGH (ref 70–99)
Glucose-Capillary: 113 mg/dL — ABNORMAL HIGH (ref 70–99)
Glucose-Capillary: 129 mg/dL — ABNORMAL HIGH (ref 70–99)

## 2019-03-09 LAB — SURGICAL PATHOLOGY

## 2019-03-09 LAB — CBC
HCT: 24.2 % — ABNORMAL LOW (ref 39.0–52.0)
Hemoglobin: 7.4 g/dL — ABNORMAL LOW (ref 13.0–17.0)
MCH: 29.6 pg (ref 26.0–34.0)
MCHC: 30.6 g/dL (ref 30.0–36.0)
MCV: 96.8 fL (ref 80.0–100.0)
Platelets: 130 10*3/uL — ABNORMAL LOW (ref 150–400)
RBC: 2.5 MIL/uL — ABNORMAL LOW (ref 4.22–5.81)
RDW: 13.9 % (ref 11.5–15.5)
WBC: 10.9 10*3/uL — ABNORMAL HIGH (ref 4.0–10.5)
nRBC: 0 % (ref 0.0–0.2)

## 2019-03-09 LAB — BASIC METABOLIC PANEL
Anion gap: 8 (ref 5–15)
BUN: 15 mg/dL (ref 8–23)
CO2: 27 mmol/L (ref 22–32)
Calcium: 7.7 mg/dL — ABNORMAL LOW (ref 8.9–10.3)
Chloride: 103 mmol/L (ref 98–111)
Creatinine, Ser: 0.73 mg/dL (ref 0.61–1.24)
GFR calc Af Amer: >60 mL/min (ref >60)
GFR calc non Af Amer: >60 mL/min (ref >60)
Glucose, Bld: 118 mg/dL — ABNORMAL HIGH (ref 70–99)
Potassium: 3.9 mmol/L (ref 3.5–5.1)
Sodium: 138 mmol/L (ref 135–145)

## 2019-03-09 MED ORDER — MIDODRINE HCL 5 MG PO TABS
2.5000 mg | ORAL_TABLET | Freq: Three times a day (TID) | ORAL | Status: DC
  Administered 2019-03-09 (×2): 2.5 mg via ORAL
  Filled 2019-03-09 (×2): qty 1

## 2019-03-09 MED ORDER — FE FUMARATE-B12-VIT C-FA-IFC PO CAPS
1.0000 | ORAL_CAPSULE | Freq: Two times a day (BID) | ORAL | Status: DC
  Administered 2019-03-09 – 2019-03-20 (×23): 1 via ORAL
  Filled 2019-03-09 (×24): qty 1

## 2019-03-09 MED ORDER — MAGNESIUM OXIDE 400 (241.3 MG) MG PO TABS
400.0000 mg | ORAL_TABLET | Freq: Two times a day (BID) | ORAL | Status: DC
  Administered 2019-03-09 – 2019-03-20 (×23): 400 mg via ORAL
  Filled 2019-03-09 (×23): qty 1

## 2019-03-09 MED ORDER — COLCHICINE 0.6 MG PO TABS
0.6000 mg | ORAL_TABLET | Freq: Two times a day (BID) | ORAL | Status: DC
  Administered 2019-03-09 – 2019-03-20 (×23): 0.6 mg via ORAL
  Filled 2019-03-09 (×22): qty 1

## 2019-03-09 MED ORDER — POTASSIUM CHLORIDE CRYS ER 20 MEQ PO TBCR
20.0000 meq | EXTENDED_RELEASE_TABLET | Freq: Two times a day (BID) | ORAL | Status: DC
  Administered 2019-03-09 – 2019-03-20 (×23): 20 meq via ORAL
  Filled 2019-03-09 (×24): qty 1

## 2019-03-09 MED ORDER — KETOROLAC TROMETHAMINE 15 MG/ML IJ SOLN
7.5000 mg | Freq: Three times a day (TID) | INTRAMUSCULAR | Status: AC
  Administered 2019-03-09 – 2019-03-12 (×12): 7.5 mg via INTRAVENOUS
  Filled 2019-03-09 (×11): qty 1

## 2019-03-09 NOTE — Progress Notes (Signed)
3 Days Post-Op Procedure(s) (LRB): CORONARY ARTERY BYPASS GRAFTING (CABG), ON PUMP, TIMES THREE, USING BILATERAL INTERNAL MAMMARIES AND LEFT RADIAL ARTERY HARVEST (N/A) AORTIC VALVE REPLACEMENT (AVR), USING INTUITY 25MM (N/A) RADIAL ARTERY HARVEST (Left) TRANSESOPHAGEAL ECHOCARDIOGRAM (TEE) (N/A) INDOCYANINE GREEN FLUORESCENCE IMAGING (ICG) (N/A) Subjective: Soreness with coughing Objective: Vital signs in last 24 hours: Temp:  [96.8 F (36 C)-98.1 F (36.7 C)] 98.1 F (36.7 C) (03/08 0400) Pulse Rate:  [62-117] 99 (03/08 0730) Cardiac Rhythm: Atrial fibrillation;Bundle branch block (03/08 0400) Resp:  [8-27] 20 (03/08 0730) BP: (80-112)/(30-65) 86/57 (03/08 0600) SpO2:  [92 %-100 %] 100 % (03/08 0730) Arterial Line BP: (100-145)/(40-73) 112/48 (03/08 0730) Weight:  [115.4 kg] 115.4 kg (03/08 0500)  Hemodynamic parameters for last 24 hours:    Intake/Output from previous day: 03/07 0701 - 03/08 0700 In: 2458.4 [P.O.:1080; I.V.:1378.4] Out: 1930 [Urine:1830; Chest Tube:100] Intake/Output this shift: No intake/output data recorded.  General appearance: alert and cooperative Neurologic: intact Heart: irregularly irregular rhythm Lungs: clear to auscultation bilaterally Abdomen: soft, non-tender; bowel sounds normal; no masses,  no organomegaly Extremities: edema mild Wound: c/d/i  Lab Results: Recent Labs    03/08/19 0500 03/08/19 0500 03/08/19 2319 03/09/19 0448  WBC 13.1*  --   --  10.9*  HGB 8.1*   < > 7.1* 7.4*  HCT 26.1*   < > 21.0* 24.2*  PLT 132*  --   --  130*   < > = values in this interval not displayed.   BMET:  Recent Labs    03/08/19 0500 03/08/19 0500 03/08/19 2319 03/09/19 0448  NA 139   < > 137 138  K 4.2   < > 3.7 3.9  CL 106   < > 103 103  CO2 25  --   --  27  GLUCOSE 122*   < > 101* 118*  BUN 14   < > 16 15  CREATININE 0.71   < > 0.70 0.73  CALCIUM 7.6*  --   --  7.7*   < > = values in this interval not displayed.    PT/INR:   Recent Labs    03/06/19 1458  LABPROT 21.9*  INR 1.9*   ABG    Component Value Date/Time   PHART 7.329 (L) 03/07/2019 0639   HCO3 22.3 03/07/2019 0639   TCO2 30 03/08/2019 2319   ACIDBASEDEF 3.0 (H) 03/07/2019 0639   O2SAT 91.0 03/07/2019 0639   CBG (last 3)  Recent Labs    03/08/19 2052 03/09/19 0038 03/09/19 0404  GLUCAP 103* 113* 103*    Assessment/Plan: S/P Procedure(s) (LRB): CORONARY ARTERY BYPASS GRAFTING (CABG), ON PUMP, TIMES THREE, USING BILATERAL INTERNAL MAMMARIES AND LEFT RADIAL ARTERY HARVEST (N/A) AORTIC VALVE REPLACEMENT (AVR), USING INTUITY 25MM (N/A) RADIAL ARTERY HARVEST (Left) TRANSESOPHAGEAL ECHOCARDIOGRAM (TEE) (N/A) INDOCYANINE GREEN FLUORESCENCE IMAGING (ICG) (N/A) Mobilize Diuresis leave in ICU today  amio for atrial fib Pain control   LOS: 5 days    Wonda Olds 03/09/2019

## 2019-03-09 NOTE — Discharge Summary (Signed)
Physician Discharge Summary  Patient ID: Gregory [name] MRN: [id number] DOB/AGE: 07/24/1950 69 y.o.  Admit date: 03/03/2019 Discharge date: 03/20/2019  Admission Diagnoses: Acute NSTEMI Coronary artery disease Aortic insufficiency Aortic stenosis Obesity History of hypertension Dyslipidemia Prediabetes   Discharge Diagnoses:  Acute NSTEMI Coronary artery disease Aortic insufficiency Aortic stenosis Obesity History of hypertension Dyslipidemia Prediabetes S/P aortic valve replacement and coronary bypass grafting x3 Postoperative atrial fibrillation Heparin induced thrombocytopenia (HIT)   Discharged Condition: good  History of Present Illness:      [name] is a 69 year old male patient with a past medical history significant for essential hypertension, moderate aortic valve insufficiency, moderate aortic valve stenosis, ETOH abuse, chronic lymphedema, chronic back pain, morbid obesity, hyperlipidemia, obsturctive sleep apnea, insulin resistance, and exposure to COVID 19 (with recent negative test on 3/3) who presented to [facility] with a chief complaint of chest pain with assicated nausea but no vomiting or diaphoresis. The pain did not radiate. Vitals were stable in the ED but high-sensitivity troponins were 741 and 1464 respectfully. EKG suspicious for MI. He underwent a cardiac cath on 3/3 which revealed: Proximal RCA lesion of 70%, mid RCA lesion of 95%, RV branch lesion of 80%, proximal circumflex lesion of 40%, proximal to mid LAD lesion of 90%, and mid LAD lesion of 70%.  Due to his complex LAD disease and multivessel disease coronary artery bypass grafting was recommended.  An echocardiogram was also performed which showed a left ventricular ejection fraction of 45 to 50%.  His aortic valve had severe regurgitation and moderate stenosis.  He plans to have a cardiac CT today to evaluate his aortic root and aorta. We are consulted for possible surgical  intervention for his aortic valve disease and coronary disease.   The patient shares that he does have some ambulation issues due to chronic knee pain.  He does use a cane with ambulation.  He still endorses working an 8-hour day without issues.  He and his wife expressed interest in cardiac rehab post procedure.  Hospital Course:   Coronary bypass grafting and aortic valve replacement was offered to the patient and he decided to proceed with surgery.  He was taken to the operating room on 03/06/2019.  The aortic valve was replaced with a 25 mm [name] Intuity bovine bioprosthetic valve.  Coronary bypass grafting x3 was also accomplished using all arterial conduit.  Please see operative note below for details.  Following the procedure, the patient was transferred to the cardiovascular ICU in stable condition.  He remained hemodynamically stable.  He was weaned from the ventilator and extubated the evening of surgery.  Levophed in renal dose dopamine were gradually weaned over the next few days.  He developed atrial fibrillation early postoperatively and was started on amiodarone drip.  This resulted in good rate control and eventual conversion back to SR. He developed profound thrombocytopenia several days following surgery. His heparin antibody screen was positive for HIT. Aspirin and Lovenox were discontinued.  He was started on an Argatroban infusion. His platelet count declined to 17,000 on 3/14 and was beginning to show evidence of recovery by 3/15 with a Plt count of 18,000. He did not have any evidence of bleeding. He and his wife were advised of his heparin allergy. His last platelet count was up to 32,000.  Hematology consult was placed.  They recommended patient be transitioned to coumadin.  His most recent INR is 2.3 with a goal of 2.0-3.0.  He was  mobilized with physical therapy.  Diet was advanced and well-tolerated.  He remains in NSR.  His blood pressure is improving on Midodrine.  He is working  with PT/OT, he is making good progress.  He is felt to be safe to discharge with home health.      Consults: hematology/oncology  Significant Diagnostic Studies:   LEFT HEART CATH AND CORONARY ANGIOGRAPHY  Conclusion    Prox RCA lesion is 70% stenosed.  Mid RCA lesion is 95% stenosed.  RV Branch lesion is 80% stenosed.  Prox Cx lesion is 40% stenosed.  Prox LAD to Mid LAD lesion is 90% stenosed.  Mid LAD lesion is 70% stenosed.   1. Severe calcific disease in the mid LAD 2. The Circumflex has mild to moderate proximal stenosis 3. The RCA is a large dominant vessel with severe proximal stenosis and severe mid stenosis with poor flow into the distal vessel.   Recommendations: He has complex disease in the mid LAD, diffuse complex disease in the proximal and mid RCA and moderate AI/AS. I suspect that his aortic root is enlarged.. Echo pending today. He is known to have at least moderate AS and AI by prior echo. I think he would benefit from surgical AVR and bypass of the LAD and RCA. We will arrange a cardiac CTA tomorrow to better assess his aortic root and ascending aorta. I have called CT surgery tonight and they will see him tomorrow.     ECHOCARDIOGRAM REPORT    Patient Name:  Gregory [name] [facility] Date of Exam: 03/04/2019  Medical Rec #: [id number]     Height:    66.0 in  Accession #:  [id number]    Weight:    264.6 lb  Date of Birth: 14-Sep-1950     BSA:     2.252 m  Patient Age:  89 years     BP:      143/52 mmHg  Patient Gender: M         HR:      73 bpm.  Exam Location: Inpatient   Procedure: 2D Echo   Indications:  Chest Pain 786.50 / R07.9    History:    Patient has prior history of Echocardiogram examinations,  most         recent 08/18/2018. Aortic Valve Disease; Risk  Factors:Prediabetes         and Sleep Apnea. History of COVID 19. ETOH abuse. Chronic         lymphedema,  chronic exertional dyspnea.    Sonographer:  Roseanna Rainbow RDCS  Referring Phys: [id number] [place]     Sonographer Comments: Technically difficult study due to poor echo windows  and patient is morbidly obese. Image acquisition challenging due to  patient body habitus.  IMPRESSIONS    1. Left ventricular ejection fraction, by estimation, is 45 to 50%. The  left ventricle has mildly decreased function. The left ventricle  demonstrates regional wall motion abnormalities, hypokinesis of the apical  septal wall and the apex, hypokinesis of  the basal inferior wall. There is moderate left ventricular hypertrophy.  Left ventricular diastolic parameters are consistent with Grade I  diastolic dysfunction (impaired relaxation).  2. Right ventricular systolic function is normal. The right ventricular  size is normal. Tricuspid regurgitation signal is inadequate for assessing  PA pressure.  3. Left atrial size was mildly dilated.  4. The mitral valve is normal in structure and function. No evidence of  mitral valve regurgitation. No evidence of  mitral stenosis.  5. The aortic valve is tricuspid. Aortic valve regurgitation is severe.  Moderate aortic valve stenosis. Mean gradient across the aortic valve is  40 mmHg with AVA, however, calculated to 1.7 cm^2. I think that there is  severe aortic insufficiency in the  setting of a dilated aortic root with high flow from AI leading to  elevated gradient across the aortic valve (probably no more than moderate  stenosis).  6. Aortic dilatation noted. There is moderate dilatation of the aortic  root measuring 46 mm.  7. The inferior vena cava is normal in size with <50% respiratory  variability, suggesting right atrial pressure of 8 mmHg.    Treatments:   CARDIOTHORACIC SURGERY OPERATIVE NOTE  Date of Procedure:    03/06/2019  Preoperative Diagnosis:      Severe 2-vessel Coronary Artery Disease and severe aortic valve  disease  Postoperative Diagnosis:    Same  Procedure:        Aortic Valve replacement (25 mm [name] Intuity bovine bioprosthetic)  Coronary Artery Bypass Grafting x 3             Left Internal Mammary Artery to Distal Left Anterior Descending Coronary Artery; left radial artery to Posterior Descending Coronary Artery; pedicled RIMA to rigth posterolateral artery. Open left radial artery Harvest; bilateral IMA harvesting Completion graft surveillance with indocyanine green fluorescence imaging (SPY);  Multilevel rib block with exparel/Marcaine solution  Surgeon:        B. Murvin Natal, MD  Assistant:       Macarthur Critchley PA-C  Anesthesia:    get  Operative Findings: ? Mildly reduced left ventricular systolic function ? good quality internal mammary artery conduits ? good quality radial artery conduit ? good quality target vessels for grafting ? Well-seated aortic valve prosthesis    BRIEF CLINICAL NOTE AND INDICATIONS FOR SURGERY 69 yo man with known aortic valve disease presented with new onset chest pain earlier this week. He ruled in for a NSTEMI. This prompted left heart catheterization which demonstrated severe two-vessel coronary artery disease. Consult was then placed for CT surgery for consideration of aortic valve replacement and coronary bypass. The risks and benefits of the procedure have been explained and he wishes to proceed.  Discharge Exam: Blood pressure 108/69, pulse 72, temperature 97.9 F (36.6 C), temperature source Oral, resp. rate 15, height 5\' 7"  (1.702 m), weight 100.7 kg, SpO2 93 %.  General appearance: alert, cooperative and no distress Heart: regular rate and rhythm Lungs: clear to auscultation bilaterally Abdomen: soft, non-tender; bowel sounds normal; no masses,  no organomegaly Extremities: extremities normal, atraumatic, no cyanosis or edema Wound: clean and dry   Discharge disposition: 01-Home or Self Care  Discharge Medications:    Allergies as of 03/20/2019      Reactions   Heparin    HIT antibody and SRA positive      Medication List    STOP taking these medications   amLODipine 10 MG tablet Commonly known as: NORVASC   diclofenac 75 MG EC tablet Commonly known as: VOLTAREN   lisinopril 40 MG tablet Commonly known as: ZESTRIL     TAKE these medications   ALPRAZolam 0.5 MG tablet Commonly known as: XANAX Take 1 tablet (0.5 mg total) by mouth daily as needed for anxiety (30 min prior to plane flight or prn anxiety).   amiodarone 200 MG tablet Commonly known as: PACERONE Take 1 tablet (200 mg total) by mouth daily.   atorvastatin 80 MG tablet  Commonly known as: LIPITOR Take 1 tablet (80 mg total) by mouth daily. What changed:   medication strength  how much to take   colchicine 0.6 MG tablet Take 1 tablet (0.6 mg total) by mouth 2 (two) times daily.   cyclobenzaprine 10 MG tablet Commonly known as: FLEXERIL Take 1 tablet (10 mg total) by mouth at bedtime as needed for muscle spasms.   furosemide 40 MG tablet Commonly known as: LASIX Take 1 tablet by mouth once daily   isosorbide mononitrate 30 MG 24 hr tablet Commonly known as: IMDUR Take 1 tablet (30 mg total) by mouth daily.   latanoprost 0.005 % ophthalmic solution Commonly known as: XALATAN Place 1 drop into both eyes every morning.   metoprolol tartrate 25 MG tablet Commonly known as: LOPRESSOR Take 0.5 tablets (12.5 mg total) by mouth 2 (two) times daily.   midodrine 2.5 MG tablet Commonly known as: PROAMATINE Take 1 tablet (2.5 mg total) by mouth 3 (three) times daily with meals.   OSTEO BI-FLEX/5-LOXIN ADVANCED PO Take 2 tablets by mouth daily.   potassium chloride SA 20 MEQ tablet Commonly known as: KLOR-CON Take 1 tablet by mouth once daily   traMADol 50 MG tablet Commonly known as: ULTRAM Take 1-2 tablets (50-100 mg total) by mouth every 4 (four) hours as needed for moderate pain.   warfarin 2.5 MG  tablet Commonly known as: Coumadin Take 1 tablet (2.5 mg total) by mouth daily.            Durable Medical Equipment  (From admission, onward)         Start     Ordered   03/18/19 0851  For home use only DME Shower stool  Once     03/18/19 0850   03/16/19 1135  For home use only DME 4 wheeled rolling walker with seat  Once    Question:  Patient needs a walker to treat with the following condition  Answer:  Weakness   03/16/19 1135        The patient has been discharged on:   1.Beta Blocker:  Yes [ x  ]                              No   [   ]                              If No, reason:  2.Ace Inhibitor/ARB: Yes [   ]                                     No  [ x   ]                                     If No, reason: labile BP  3.Statin:   Yes [  x ]                  No  [   ]                  If No, reason:  4.Ecasa:  Yes  [  x ]  No   [   ]                  If No, reason:       Follow-up Information    Wonda Olds, MD. Go on 03/30/2019.   Specialty: Cardiothoracic Surgery Why: Appointment time is at 3:00 pm Contact information: [address] [place] [address] [place], [place] Follow up.   Specialty: Home Health Services Why: HHPT Contact information: [place] [address] [facility] Follow up.   Why: rollator , will bring to room prior to discharge. Contact information: [place] [address] [phone]        Lendon Colonel, NP. Go on 04/08/2019.   Specialties: Nurse Practitioner, Radiology, Cardiology Why: Appointment time is at 2:15 pm Contact information: [address] [place] [address] [place] Follow up on 03/23/2019.   Specialty: Cardiology Why: Appointment is at 2:30 for PT/INR Contact information: [place] [phone]       Nicholas Lose, MD Follow up on 03/26/2019.   Specialty: Hematology and Oncology Why: Appointment is at 3:00 for follow up of HIT Contact information: [place] [phone] [phone]             Signed:  Ellwood Handler, PA-C 03/20/2019, 7:43 AM

## 2019-03-09 NOTE — Addendum Note (Signed)
Addendum  created 03/09/19 0755 by Josephine Igo, CRNA   Order list changed

## 2019-03-10 ENCOUNTER — Inpatient Hospital Stay (HOSPITAL_COMMUNITY): Payer: Medicare HMO

## 2019-03-10 LAB — GLUCOSE, CAPILLARY
Glucose-Capillary: 103 mg/dL — ABNORMAL HIGH (ref 70–99)
Glucose-Capillary: 109 mg/dL — ABNORMAL HIGH (ref 70–99)
Glucose-Capillary: 111 mg/dL — ABNORMAL HIGH (ref 70–99)
Glucose-Capillary: 112 mg/dL — ABNORMAL HIGH (ref 70–99)
Glucose-Capillary: 88 mg/dL (ref 70–99)
Glucose-Capillary: 95 mg/dL (ref 70–99)
Glucose-Capillary: 95 mg/dL (ref 70–99)

## 2019-03-10 LAB — BASIC METABOLIC PANEL
Anion gap: 7 (ref 5–15)
BUN: 16 mg/dL (ref 8–23)
CO2: 29 mmol/L (ref 22–32)
Calcium: 7.5 mg/dL — ABNORMAL LOW (ref 8.9–10.3)
Chloride: 102 mmol/L (ref 98–111)
Creatinine, Ser: 0.77 mg/dL (ref 0.61–1.24)
GFR calc Af Amer: >60 mL/min (ref >60)
GFR calc non Af Amer: >60 mL/min (ref >60)
Glucose, Bld: 126 mg/dL — ABNORMAL HIGH (ref 70–99)
Potassium: 4.6 mmol/L (ref 3.5–5.1)
Sodium: 138 mmol/L (ref 135–145)

## 2019-03-10 LAB — CBC
HCT: 25 % — ABNORMAL LOW (ref 39.0–52.0)
Hemoglobin: 7.7 g/dL — ABNORMAL LOW (ref 13.0–17.0)
MCH: 30.6 pg (ref 26.0–34.0)
MCHC: 30.8 g/dL (ref 30.0–36.0)
MCV: 99.2 fL (ref 80.0–100.0)
Platelets: 150 10*3/uL (ref 150–400)
RBC: 2.52 MIL/uL — ABNORMAL LOW (ref 4.22–5.81)
RDW: 14.1 % (ref 11.5–15.5)
WBC: 9.3 10*3/uL (ref 4.0–10.5)
nRBC: 0.5 % — ABNORMAL HIGH (ref 0.0–0.2)

## 2019-03-10 MED ORDER — MIDODRINE HCL 5 MG PO TABS
5.0000 mg | ORAL_TABLET | Freq: Three times a day (TID) | ORAL | Status: DC
  Administered 2019-03-10 – 2019-03-13 (×8): 5 mg via ORAL
  Filled 2019-03-10 (×9): qty 1

## 2019-03-10 NOTE — Plan of Care (Signed)
  Problem: Education: Goal: Knowledge of General Education information will improve Description: Including pain rating scale, medication(s)/side effects and non-pharmacologic comfort measures Outcome: Progressing   Problem: Education: Goal: Knowledge of General Education information will improve Description: Including pain rating scale, medication(s)/side effects and non-pharmacologic comfort measures Outcome: Progressing   Problem: Health Behavior/Discharge Planning: Goal: Ability to manage health-related needs will improve Outcome: Progressing   Problem: Clinical Measurements: Goal: Ability to maintain clinical measurements within normal limits will improve Outcome: Progressing   Problem: Activity: Goal: Risk for activity intolerance will decrease Outcome: Progressing   Problem: Nutrition: Goal: Adequate nutrition will be maintained Outcome: Progressing   Problem: Coping: Goal: Level of anxiety will decrease Outcome: Progressing   Problem: Elimination: Goal: Will not experience complications related to bowel motility Outcome: Progressing Goal: Will not experience complications related to urinary retention Outcome: Progressing   Problem: Pain Managment: Goal: General experience of comfort will improve Outcome: Progressing   Problem: Safety: Goal: Ability to remain free from injury will improve Outcome: Progressing   Problem: Skin Integrity: Goal: Risk for impaired skin integrity will decrease Outcome: Progressing

## 2019-03-10 NOTE — Plan of Care (Signed)
  Problem: Education: Goal: Knowledge of General Education information will improve Description: Including pain rating scale, medication(s)/side effects and non-pharmacologic comfort measures Outcome: Progressing   Problem: Health Behavior/Discharge Planning: Goal: Ability to manage health-related needs will improve Outcome: Progressing   Problem: Clinical Measurements: Goal: Ability to maintain clinical measurements within normal limits will improve Outcome: Progressing Goal: Will remain free from infection Outcome: Progressing Goal: Diagnostic test results will improve Outcome: Progressing Goal: Respiratory complications will improve Outcome: Progressing Goal: Cardiovascular complication will be avoided Outcome: Progressing   Problem: Activity: Goal: Risk for activity intolerance will decrease Outcome: Progressing   Problem: Nutrition: Goal: Adequate nutrition will be maintained Outcome: Progressing   Problem: Coping: Goal: Level of anxiety will decrease Outcome: Progressing   Problem: Elimination: Goal: Will not experience complications related to bowel motility Outcome: Progressing Goal: Will not experience complications related to urinary retention Outcome: Progressing   Problem: Pain Managment: Goal: General experience of comfort will improve Outcome: Progressing   Problem: Safety: Goal: Ability to remain free from injury will improve Outcome: Progressing   Problem: Skin Integrity: Goal: Risk for impaired skin integrity will decrease Outcome: Progressing   Problem: Education: Goal: Understanding of cardiac disease, CV risk reduction, and recovery process will improve Outcome: Progressing Goal: Understanding of medication regimen will improve Outcome: Progressing Goal: Individualized Educational Video(s) Outcome: Progressing   Problem: Activity: Goal: Ability to tolerate increased activity will improve Outcome: Progressing   Problem: Cardiac: Goal:  Ability to achieve and maintain adequate cardiopulmonary perfusion will improve Outcome: Progressing Goal: Vascular access site(s) Level 0-1 will be maintained Outcome: Progressing   Problem: Health Behavior/Discharge Planning: Goal: Ability to safely manage health-related needs after discharge will improve Outcome: Progressing   Problem: Education: Goal: Will demonstrate proper wound care and an understanding of methods to prevent future damage Outcome: Progressing Goal: Knowledge of disease or condition will improve Outcome: Progressing Goal: Knowledge of the prescribed therapeutic regimen will improve Outcome: Progressing Goal: Individualized Educational Video(s) Outcome: Progressing   Problem: Activity: Goal: Risk for activity intolerance will decrease Outcome: Progressing   Problem: Cardiac: Goal: Will achieve and/or maintain hemodynamic stability Outcome: Progressing   Problem: Clinical Measurements: Goal: Postoperative complications will be avoided or minimized Outcome: Progressing   Problem: Respiratory: Goal: Respiratory status will improve Outcome: Progressing   Problem: Skin Integrity: Goal: Wound healing without signs and symptoms of infection Outcome: Progressing Goal: Risk for impaired skin integrity will decrease Outcome: Progressing   Problem: Urinary Elimination: Goal: Ability to achieve and maintain adequate renal perfusion and functioning will improve Outcome: Progressing   Problem: Activity: Goal: Risk for activity intolerance will decrease Outcome: Progressing   Problem: Cardiac: Goal: Will achieve and/or maintain hemodynamic stability Outcome: Progressing   Problem: Respiratory: Goal: Respiratory status will improve Outcome: Progressing   Problem: Urinary Elimination: Goal: Ability to achieve and maintain adequate renal perfusion and functioning will improve Outcome: Progressing

## 2019-03-10 NOTE — Plan of Care (Signed)
  Problem: Cardiac: Goal: Will achieve and/or maintain hemodynamic stability Outcome: Progressing   Problem: Respiratory: Goal: Respiratory status will improve Outcome: Progressing   Problem: Urinary Elimination: Goal: Ability to achieve and maintain adequate renal perfusion and functioning will improve Outcome: Progressing   Problem: Cardiac: Goal: Will achieve and/or maintain hemodynamic stability Outcome: Progressing   Problem: Respiratory: Goal: Respiratory status will improve Outcome: Progressing

## 2019-03-10 NOTE — Progress Notes (Signed)
4 Days Post-Op Procedure(s) (LRB): CORONARY ARTERY BYPASS GRAFTING (CABG), ON PUMP, TIMES THREE, USING BILATERAL INTERNAL MAMMARIES AND LEFT RADIAL ARTERY HARVEST (N/A) AORTIC VALVE REPLACEMENT (AVR), USING INTUITY 25MM (N/A) RADIAL ARTERY HARVEST (Left) TRANSESOPHAGEAL ECHOCARDIOGRAM (TEE) (N/A) INDOCYANINE GREEN FLUORESCENCE IMAGING (ICG) (N/A) Subjective: No complaints  Objective: Vital signs in last 24 hours: Temp:  [97.9 F (36.6 C)-98.2 F (36.8 C)] 98 F (36.7 C) (03/09 0400) Pulse Rate:  [61-97] 74 (03/09 0700) Cardiac Rhythm: (P) Atrial fibrillation;Bundle branch block (03/08 2055) Resp:  [8-27] 21 (03/09 0700) BP: (92-119)/(51-67) 119/66 (03/09 0700) SpO2:  [91 %-100 %] 96 % (03/09 0700) Arterial Line BP: (74-116)/(37-55) 108/43 (03/08 1715) Weight:  [116.6 kg] 116.6 kg (03/09 0500)  Hemodynamic parameters for last 24 hours:    Intake/Output from previous day: 03/08 0701 - 03/09 0700 In: 1879.9 [P.O.:240; I.V.:1639.9] Out: 2500 [Urine:2500] Intake/Output this shift: No intake/output data recorded.  General appearance: alert and cooperative Neurologic: intact Heart: regular rate and rhythm, S1, S2 normal, no murmur, click, rub or gallop Lungs: clear to auscultation bilaterally Abdomen: soft, non-tender; bowel sounds normal; no masses,  no organomegaly Extremities: extremities normal, atraumatic, no cyanosis or edema Wound: c/d/i  Lab Results: Recent Labs    03/08/19 0500 03/08/19 0500 03/08/19 2319 03/09/19 0448  WBC 13.1*  --   --  10.9*  HGB 8.1*   < > 7.1* 7.4*  HCT 26.1*   < > 21.0* 24.2*  PLT 132*  --   --  130*   < > = values in this interval not displayed.   BMET:  Recent Labs    03/09/19 0448 03/10/19 0448  NA 138 138  K 3.9 4.6  CL 103 102  CO2 27 29  GLUCOSE 118* 126*  BUN 15 16  CREATININE 0.73 0.77  CALCIUM 7.7* 7.5*    PT/INR: No results for input(s): LABPROT, INR in the last 72 hours. ABG    Component Value Date/Time   PHART 7.329 (L) 03/07/2019 0639   HCO3 22.3 03/07/2019 0639   TCO2 30 03/08/2019 2319   ACIDBASEDEF 3.0 (H) 03/07/2019 0639   O2SAT 91.0 03/07/2019 0639   CBG (last 3)  Recent Labs    03/09/19 2037 03/10/19 0049 03/10/19 0438  GLUCAP 112* 95 103*    Assessment/Plan: S/P Procedure(s) (LRB): CORONARY ARTERY BYPASS GRAFTING (CABG), ON PUMP, TIMES THREE, USING BILATERAL INTERNAL MAMMARIES AND LEFT RADIAL ARTERY HARVEST (N/A) AORTIC VALVE REPLACEMENT (AVR), USING INTUITY 25MM (N/A) RADIAL ARTERY HARVEST (Left) TRANSESOPHAGEAL ECHOCARDIOGRAM (TEE) (N/A) INDOCYANINE GREEN FLUORESCENCE IMAGING (ICG) (N/A) Mobilize Diuresis See progression orders   LOS: 6 days    Gregory [name] 03/10/2019

## 2019-03-10 NOTE — Progress Notes (Signed)
CARDIAC REHAB PHASE I   PRE:  Rate/Rhythm: 78 SR    BP: sitting 113/65    SaO2: 97 4L  MODE:  Ambulation: 370 ft   POST:  Rate/Rhythm: 94 SR    BP: sitting 105/66     SaO2: 97 4L  Pt up to EOB with verbal cues and mod assist. Stood independently. Walked with EVA, 4L, assist x2 (standby). Anterior lean. Increased distance without rest. To recliner, no major c/o. Encouraged IS and another walk this evening. Wife present. Max, ACSM 03/10/2019 1:31 PM

## 2019-03-11 LAB — BASIC METABOLIC PANEL
Anion gap: 8 (ref 5–15)
BUN: 15 mg/dL (ref 8–23)
CO2: 33 mmol/L — ABNORMAL HIGH (ref 22–32)
Calcium: 7.7 mg/dL — ABNORMAL LOW (ref 8.9–10.3)
Chloride: 99 mmol/L (ref 98–111)
Creatinine, Ser: 0.77 mg/dL (ref 0.61–1.24)
GFR calc Af Amer: >60 mL/min (ref >60)
GFR calc non Af Amer: >60 mL/min (ref >60)
Glucose, Bld: 100 mg/dL — ABNORMAL HIGH (ref 70–99)
Potassium: 4.1 mmol/L (ref 3.5–5.1)
Sodium: 140 mmol/L (ref 135–145)

## 2019-03-11 LAB — GLUCOSE, CAPILLARY
Glucose-Capillary: 100 mg/dL — ABNORMAL HIGH (ref 70–99)
Glucose-Capillary: 106 mg/dL — ABNORMAL HIGH (ref 70–99)
Glucose-Capillary: 121 mg/dL — ABNORMAL HIGH (ref 70–99)
Glucose-Capillary: 96 mg/dL (ref 70–99)
Glucose-Capillary: 97 mg/dL (ref 70–99)

## 2019-03-11 LAB — CBC
HCT: 24 % — ABNORMAL LOW (ref 39.0–52.0)
Hemoglobin: 7.5 g/dL — ABNORMAL LOW (ref 13.0–17.0)
MCH: 29.6 pg (ref 26.0–34.0)
MCHC: 31.3 g/dL (ref 30.0–36.0)
MCV: 94.9 fL (ref 80.0–100.0)
Platelets: 104 10*3/uL — ABNORMAL LOW (ref 150–400)
RBC: 2.53 MIL/uL — ABNORMAL LOW (ref 4.22–5.81)
RDW: 14 % (ref 11.5–15.5)
WBC: 6.8 10*3/uL (ref 4.0–10.5)
nRBC: 0.3 % — ABNORMAL HIGH (ref 0.0–0.2)

## 2019-03-11 MED ORDER — AMIODARONE HCL 200 MG PO TABS
200.0000 mg | ORAL_TABLET | Freq: Every day | ORAL | Status: DC
  Administered 2019-03-11 – 2019-03-20 (×10): 200 mg via ORAL
  Filled 2019-03-11 (×10): qty 1

## 2019-03-11 MED ORDER — METOCLOPRAMIDE HCL 5 MG/ML IJ SOLN
10.0000 mg | Freq: Four times a day (QID) | INTRAMUSCULAR | Status: AC
  Administered 2019-03-11 (×2): 10 mg via INTRAVENOUS
  Filled 2019-03-11 (×2): qty 2

## 2019-03-11 MED ORDER — ALUM & MAG HYDROXIDE-SIMETH 200-200-20 MG/5ML PO SUSP
30.0000 mL | ORAL | Status: DC | PRN
  Administered 2019-03-11: 30 mL via ORAL
  Filled 2019-03-11: qty 30

## 2019-03-11 MED ORDER — METOCLOPRAMIDE HCL 5 MG/ML IJ SOLN: 10.0000 mg | Freq: Four times a day (QID) | INTRAMUSCULAR | Status: DC | PRN

## 2019-03-11 MED FILL — Sodium Chloride IV Soln 0.9%: INTRAVENOUS | Qty: 3000 | Status: AC

## 2019-03-11 MED FILL — Sodium Bicarbonate IV Soln 8.4%: INTRAVENOUS | Qty: 50 | Status: AC

## 2019-03-11 MED FILL — Mannitol IV Soln 20%: INTRAVENOUS | Qty: 500 | Status: AC

## 2019-03-11 MED FILL — Heparin Sodium (Porcine) Inj 1000 Unit/ML: INTRAMUSCULAR | Qty: 30 | Status: AC

## 2019-03-11 MED FILL — Magnesium Sulfate Inj 50%: INTRAMUSCULAR | Qty: 2 | Status: AC

## 2019-03-11 MED FILL — Potassium Chloride Inj 2 mEq/ML: INTRAVENOUS | Qty: 40 | Status: AC

## 2019-03-11 MED FILL — Heparin Sodium (Porcine) Inj 1000 Unit/ML: INTRAMUSCULAR | Qty: 20 | Status: AC

## 2019-03-11 MED FILL — Electrolyte-R (PH 7.4) Solution: INTRAVENOUS | Qty: 3000 | Status: AC

## 2019-03-11 NOTE — Evaluation (Signed)
Occupational Therapy Evaluation Patient Details Name: Gregory [name] MRN: [id number] DOB: 10-27-1950 Today's Date: 03/11/2019    History of Present Illness Pt is a 69 yo male s/p CABG x3, aortic valve replacement, radial artery harvest and TEE. PMHx: HTN, HLD, obstructive sleep apnea, chronic lymphedema in BLEs, obesity, back pain, ETOH use, and chronic exertional.   Clinical Impression   Pt PTA: Pt living at home with spouse; still working full time and very active. Pt currently requires additional OT for safety with precautions with ADL, mobility and energy conservation education. Pt very limited for LB ADL at this time; maxA and minA for UB ADL. Pt was using sock aid at home to donn compression stockings/socks.  Pt 95% on 2L at EOB; pt desatting to mid 80s when standing at sink requiring 4L O2 and pursed lip breathing techniques to assist. Pt returned to EOB >90% within 30 secs of resting on 2L [place] O2. Pt would benefit from continued OT skilled services. OT following acutely.    Follow Up Recommendations  Home health OT;Supervision/Assistance - 24 hour(initially)    Equipment Recommendations  3 in 1 bedside commode    Recommendations for Other Services       Precautions / Restrictions Precautions Precautions: Fall;Other (comment) Precaution Comments: O2 Restrictions Weight Bearing Restrictions: Yes Other Position/Activity Restrictions: sternal precautions      Mobility Bed Mobility Overal bed mobility: Needs Assistance Bed Mobility: Rolling;Supine to Sit;Sit to Sidelying Rolling: Min guard   Supine to sit: Mod assist Sit to supine: Min assist Sit to sidelying: Min guard General bed mobility comments: modA for trunk elevation and cues to scoot to EOB; minA for BLE management when getting back in bed  Transfers Overall transfer level: Needs assistance Equipment used: Rolling walker (2 wheeled) Transfers: Sit to/from Stand Sit to Stand: Min guard         General  transfer comment: using momentum    Balance Overall balance assessment: Needs assistance Sitting-balance support: No upper extremity supported;Feet supported Sitting balance-Leahy Scale: Good Sitting balance - Comments: supervision   Standing balance support: Bilateral upper extremity supported Standing balance-Leahy Scale: Good Standing balance comment: supervision with BUE support of RW                           ADL either performed or assessed with clinical judgement   ADL Overall ADL's : Needs assistance/impaired Eating/Feeding: Set up;Sitting   Grooming: Min guard;Standing   Upper Body Bathing: Minimal assistance;Standing   Lower Body Bathing: Maximal assistance;Sitting/lateral leans;Sit to/from stand;Cueing for safety   Upper Body Dressing : Minimal assistance;Sitting   Lower Body Dressing: Maximal assistance;Sitting/lateral leans;Sit to/from stand;Cueing for safety   Toilet Transfer: Min guard;Ambulation;Regular Toilet;RW   Toileting- Clothing Manipulation and Hygiene: Moderate assistance;Maximal assistance;Cueing for safety;Sitting/lateral lean;Sit to/from stand       Functional mobility during ADLs: Min guard;Rolling walker General ADL Comments: Pt requires additional OT for safety with precautions with ADL, mobility and energy conservation education. Pt very limited for LB ADL at this time. Pt was using sock aid at home to donn compression stockings/socks.      Vision Baseline Vision/History: Wears glasses Wears Glasses: At all times Patient Visual Report: No change from baseline Vision Assessment?: No apparent visual deficits     Perception     Praxis      Pertinent Vitals/Pain Pain Assessment: Faces Pain Score: 5  Faces Pain Scale: Hurts little more Pain Location: incision Pain Descriptors /  Indicators: Grimacing Pain Intervention(s): Limited activity within patient's tolerance     Hand Dominance Left   Extremity/Trunk Assessment  Upper Extremity Assessment Upper Extremity Assessment: Overall WFL for tasks assessed   Lower Extremity Assessment Lower Extremity Assessment: Generalized weakness   Cervical / Trunk Assessment Cervical / Trunk Assessment: Normal   Communication Communication Communication: No difficulties   Cognition Arousal/Alertness: Awake/alert Behavior During Therapy: WFL for tasks assessed/performed Overall Cognitive Status: Within Functional Limits for tasks assessed                                 General Comments: Pt able to answer all PLOF questions.   General Comments  pt on 3L [place] upon PT arrival, PT attempts to wean O2 but pt desats to 83% on RA. Pt desats into mid 80s on 2L [place]. Pt also reading with desaturation into 70s on 3L [place], however PT believes this is an inaccurate reading as pt conversing without distress during ambulation. Pt SpO2 reading 94% on 3L [place] immediately upon return to sitting at edge of bed    Exercises     Shoulder Instructions      Home Living Family/patient expects to be discharged to:: Private residence Living Arrangements: Spouse/significant other Available Help at Discharge: Family;Available 24 hours/day Type of Home: House Home Access: Stairs to enter [employer] of Steps: 2 Entrance Stairs-Rails: None Home Layout: One level     Bathroom Shower/Tub: Occupational psychologist: Standard     Home Equipment: Cane - single point          Prior Functioning/Environment Level of Independence: Independent with assistive device(s)(ambulates with cane)        Comments: Works in [employer] 8 hours daily M-F        OT Problem List: Decreased strength;Decreased activity tolerance;Impaired balance (sitting and/or standing);Decreased safety awareness;Pain;Increased edema;Decreased knowledge of use of DME or AE;Decreased knowledge of precautions      OT Treatment/Interventions: Self-care/ADL training;Therapeutic  exercise;Energy conservation;DME and/or AE instruction;Therapeutic activities;Patient/family education;Balance training    OT Goals(Current goals can be found in the care plan section) Acute Rehab OT Goals Patient Stated Goal: To improve ability to take care of self OT Goal Formulation: With patient Time For Goal Achievement: 03/25/19 Potential to Achieve Goals: Good ADL Goals Pt Will Perform Lower Body Dressing: with min guard assist;sit to/from stand;with adaptive equipment Pt Will Transfer to Toilet: with supervision;ambulating Pt Will Perform Toileting - Clothing Manipulation and hygiene: with supervision;sit to/from stand Pt/caregiver will Perform Home Exercise Program: Increased strength;Both right and left upper extremity;With Supervision Additional ADL Goal #1: Pt will be able to state 3 energy conservation techniques.  OT Frequency: Min 2X/week   Barriers to D/C:            Co-evaluation              AM-PAC OT "6 Clicks" Daily Activity     Outcome Measure Help from another person eating meals?: None Help from another person taking care of personal grooming?: A Little Help from another person toileting, which includes using toliet, bedpan, or urinal?: A Lot Help from another person bathing (including washing, rinsing, drying)?: A Lot Help from another person to put on and taking off regular upper body clothing?: A Lot Help from another person to put on and taking off regular lower body clothing?: Total 6 Click Score: 14   End of Session Equipment Utilized During  Treatment: Rolling walker Nurse Communication: Mobility status  Activity Tolerance: Patient tolerated treatment well;Patient limited by fatigue Patient left: in bed;with call bell/phone within reach;with family/visitor present  OT Visit Diagnosis: Unsteadiness on feet (R26.81);Muscle weakness (generalized) (M62.81)                Time: [id number] OT Time Calculation (min): 36 min Charges:  OT General  Charges $OT Visit: 1 Visit OT Evaluation $OT Eval Moderate Complexity: 1 Mod OT Treatments $Self Care/Home Management : 8-22 mins  Jefferey Pica, OTR/L Acute Rehabilitation Services Pager: [phone] Office: [phone]   Jarrah Babich C 03/11/2019, 5:30 PM

## 2019-03-11 NOTE — Plan of Care (Signed)
  Problem: Education: Goal: Knowledge of General Education information will improve Description: Including pain rating scale, medication(s)/side effects and non-pharmacologic comfort measures Outcome: Progressing   Problem: Education: Goal: Knowledge of General Education information will improve Description: Including pain rating scale, medication(s)/side effects and non-pharmacologic comfort measures Outcome: Progressing   Problem: Health Behavior/Discharge Planning: Goal: Ability to manage health-related needs will improve Outcome: Progressing   Problem: Clinical Measurements: Goal: Ability to maintain clinical measurements within normal limits will improve Outcome: Progressing Goal: Will remain free from infection Outcome: Progressing Goal: Diagnostic test results will improve Outcome: Progressing Goal: Respiratory complications will improve Outcome: Progressing Goal: Cardiovascular complication will be avoided Outcome: Progressing   Problem: Activity: Goal: Risk for activity intolerance will decrease Outcome: Progressing   Problem: Nutrition: Goal: Adequate nutrition will be maintained Outcome: Progressing   Problem: Coping: Goal: Level of anxiety will decrease Outcome: Progressing   Problem: Elimination: Goal: Will not experience complications related to bowel motility Outcome: Progressing Goal: Will not experience complications related to urinary retention Outcome: Progressing   Problem: Pain Managment: Goal: General experience of comfort will improve Outcome: Progressing   Problem: Safety: Goal: Ability to remain free from injury will improve Outcome: Progressing   Problem: Skin Integrity: Goal: Risk for impaired skin integrity will decrease Outcome: Progressing   Problem: Education: Goal: Understanding of cardiac disease, CV risk reduction, and recovery process will improve Outcome: Progressing Goal: Understanding of medication regimen will  improve Outcome: Progressing Goal: Individualized Educational Video(s) Outcome: Progressing   Problem: Activity: Goal: Ability to tolerate increased activity will improve Outcome: Progressing   Problem: Cardiac: Goal: Ability to achieve and maintain adequate cardiopulmonary perfusion will improve Outcome: Progressing Goal: Vascular access site(s) Level 0-1 will be maintained Outcome: Progressing   Problem: Health Behavior/Discharge Planning: Goal: Ability to safely manage health-related needs after discharge will improve Outcome: Progressing   Problem: Education: Goal: Will demonstrate proper wound care and an understanding of methods to prevent future damage Outcome: Progressing Goal: Knowledge of disease or condition will improve Outcome: Progressing Goal: Knowledge of the prescribed therapeutic regimen will improve Outcome: Progressing Goal: Individualized Educational Video(s) Outcome: Progressing   Problem: Activity: Goal: Risk for activity intolerance will decrease Outcome: Progressing   Problem: Cardiac: Goal: Will achieve and/or maintain hemodynamic stability Outcome: Progressing   Problem: Clinical Measurements: Goal: Postoperative complications will be avoided or minimized Outcome: Progressing   Problem: Respiratory: Goal: Respiratory status will improve Outcome: Progressing   Problem: Skin Integrity: Goal: Wound healing without signs and symptoms of infection Outcome: Progressing Goal: Risk for impaired skin integrity will decrease Outcome: Progressing   Problem: Urinary Elimination: Goal: Ability to achieve and maintain adequate renal perfusion and functioning will improve Outcome: Progressing   Problem: Activity: Goal: Risk for activity intolerance will decrease Outcome: Progressing   Problem: Cardiac: Goal: Will achieve and/or maintain hemodynamic stability Outcome: Progressing   Problem: Respiratory: Goal: Respiratory status will  improve Outcome: Progressing   Problem: Urinary Elimination: Goal: Ability to achieve and maintain adequate renal perfusion and functioning will improve Outcome: Progressing

## 2019-03-11 NOTE — Progress Notes (Signed)
CARDIAC REHAB PHASE I   PRE:  Rate/Rhythm: 79 SR    BP: sitting 124/77    SaO2: 93 4L  MODE:  Ambulation: 340 ft   POST:  Rate/Rhythm: 92 SR    BP: sitting 130/66     SaO2: 90 3L  Pt had not been up since yesterday afternoon per pt. Mod assist to get to EOB. Able to stand with rocking. Used RW in hall, slow and steady pace. C/o SOB and fatigue. Attempted 2L initially but 88 2L. Increased to 3L and maintained 90 3L rest of walk. To recliner after walk and left on 2L in room, notified RN.  Encouraged x2 more walks and IS and recliner. Pt will need encouragement. [place], ACSM 03/11/2019 11:24 AM

## 2019-03-11 NOTE — Progress Notes (Signed)
5 Days Post-Op Procedure(s) (LRB): CORONARY ARTERY BYPASS GRAFTING (CABG), ON PUMP, TIMES THREE, USING BILATERAL INTERNAL MAMMARIES AND LEFT RADIAL ARTERY HARVEST (N/A) AORTIC VALVE REPLACEMENT (AVR), USING INTUITY 25MM (N/A) RADIAL ARTERY HARVEST (Left) TRANSESOPHAGEAL ECHOCARDIOGRAM (TEE) (N/A) INDOCYANINE GREEN FLUORESCENCE IMAGING (ICG) (N/A) Subjective: Awake and alert. C/O some indigestion, no BM yet.  Maintaining SR on amiodarone infusion.   Objective: Vital signs in last 24 hours: Temp:  [97.7 F (36.5 C)-98.9 F (37.2 C)] 97.9 F (36.6 C) (03/10 0737) Pulse Rate:  [63-77] 73 (03/10 0737) Cardiac Rhythm: Normal sinus rhythm;Heart block (03/10 0737) Resp:  [11-22] 21 (03/10 0737) BP: (97-132)/(54-83) 132/83 (03/10 0737) SpO2:  [95 %-100 %] 100 % (03/10 0737) Weight:  [116.3 kg] 116.3 kg (03/10 0326)    Intake/Output from previous day: 03/09 0701 - 03/10 0700 In: 493.5 [I.V.:493.5] Out: 2025 [Urine:2025] Intake/Output this shift: No intake/output data recorded.  General appearance: alert, cooperative and no distress Neurologic: intact Heart: SR with first degreee AVB Lungs: Breath sounds clear. O2 sats acceptable on RA. Abdomen: Soft, non-tender. Extremities: All are warm and well perfused. Left arm incision is intact and dry.  Wound: the sternal incision is well approximated.  Lab Results: Recent Labs    03/10/19 0448 03/11/19 0234  WBC 9.3 6.8  HGB 7.7* 7.5*  HCT 25.0* 24.0*  PLT 150 104*   BMET:  Recent Labs    03/10/19 0448 03/11/19 0234  NA 138 140  K 4.6 4.1  CL 102 99  CO2 29 33*  GLUCOSE 126* 100*  BUN 16 15  CREATININE 0.77 0.77  CALCIUM 7.5* 7.7*    PT/INR: No results for input(s): LABPROT, INR in the last 72 hours. ABG    Component Value Date/Time   PHART 7.329 (L) 03/07/2019 0639   HCO3 22.3 03/07/2019 0639   TCO2 30 03/08/2019 2319   ACIDBASEDEF 3.0 (H) 03/07/2019 0639   O2SAT 91.0 03/07/2019 0639   CBG (last 3)  Recent  Labs    03/10/19 2015 03/10/19 2331 03/11/19 0328  GLUCAP 111* 88 97    Assessment/Plan: S/P Procedure(s) (LRB): CORONARY ARTERY BYPASS GRAFTING (CABG), ON PUMP, TIMES THREE, USING BILATERAL INTERNAL MAMMARIES AND LEFT RADIAL ARTERY HARVEST (N/A) AORTIC VALVE REPLACEMENT (AVR), USING INTUITY 25MM (N/A) RADIAL ARTERY HARVEST (Left) TRANSESOPHAGEAL ECHOCARDIOGRAM (TEE) (N/A) INDOCYANINE GREEN FLUORESCENCE IMAGING (ICG) (N/A)  -POD-5 CABG / AVR. BP acceptable off all pressors for ~24 hours. On midodrine 5mg  BID. Making slow progress with mobility, will request inatient rehab eval.   -Post-op A-fib-- now in SR with 1st degree AVB. Convert to oral amiodarone. K+4.1.  -Expected acute blood loss anemia--no indication for transfusion. Continue Trinsicon and monitor.  -Constipation / indigestion- PRN laxative, Mylanta. Reglan x 3 doses, ambulate.   -Endo- glucose is well controlled.   LOS: 7 days    Antony Odea, [place] [phone] 03/11/2019

## 2019-03-11 NOTE — Progress Notes (Signed)
Inpatient Rehabilitation Admissions Coordinator  PT and OT evals with home health recommended when medically ready for d/c. Patient mod assist supine to sit, min guard with transfers and then 400 feet with supervision. He is progressing well and will not need an inpt rehab admission at this current high functional level. Recommend HH and 24/7 assist of family when medically ready for d/c. Please call me with any questions.  Danne Baxter, RN, MSN Rehab Admissions Coordinator [phone] 03/11/2019 6:13 PM

## 2019-03-11 NOTE — Evaluation (Signed)
Physical Therapy Evaluation Patient Details Name: Gregory [name] MRN: [id number] DOB: 1950-03-05 Today's Date: 03/11/2019   History of Present Illness  Pt is a 69 yo male s/p CABG x3, aortic valve replacement, radial artery harvest and TEE. PMHx: HTN, HLD, obstructive sleep apnea, chronic lymphedema in BLEs, obesity, back pain, ETOH use, and chronic exertional.  Clinical Impression  Pt presents to PT with deficits in functional mobility, gait, balance, power, endurance, strength, and cognition. Pt is somewhat unsteady at times during ambulation with minor LOB and bumping into wall twice during gait training. Pt also requires physical assistance to perform bed mobility in alignment with sternal precautions. Pt will benefit from aggressive mobilization to improve functional mobility quality and reduce falls risk. Pt will benefit from acute PT session with wife present to assist in family education on mobility technique and guarding.    Follow Up Recommendations Home health PT;Supervision for mobility/OOB    Equipment Recommendations  Rolling walker with 5" wheels    Recommendations for Other Services       Precautions / Restrictions Precautions Precautions: Fall;Other (comment) Precaution Comments: O2 Restrictions Weight Bearing Restrictions: Yes Other Position/Activity Restrictions: sternal precautions      Mobility  Bed Mobility Overal bed mobility: Needs Assistance Bed Mobility: Rolling;Supine to Sit;Sit to Sidelying Rolling: Min guard   Supine to sit: Mod assist   Sit to sidelying: Min guard    Transfers Overall transfer level: Needs assistance Equipment used: Rolling walker (2 wheeled) Transfers: Sit to/from Stand Sit to Stand: Min guard            Ambulation/Gait Ambulation/Gait assistance: Supervision Gait Distance (Feet): 400 Feet Assistive device: Rolling walker (2 wheeled) Gait Pattern/deviations: Step-through pattern Gait velocity: reduced Gait  velocity interpretation: 1.31 - 2.62 ft/sec, indicative of limited community ambulator General Gait Details: pt with slowed step through gait, pt bumping into wall 3 times and with one minor LOB upon initiating gait requiring minA to correct, otherwise [facility]  Science writer    Modified Rankin (Stroke Patients Only)       Balance Overall balance assessment: Needs assistance Sitting-balance support: No upper extremity supported;Feet supported Sitting balance-Leahy Scale: Good Sitting balance - Comments: supervision   Standing balance support: Bilateral upper extremity supported Standing balance-Leahy Scale: Good Standing balance comment: supervision with BUE support of RW                             Pertinent Vitals/Pain Pain Assessment: Faces Pain Score: 5  Faces Pain Scale: Hurts little more Pain Location: incision Pain Descriptors / Indicators: Grimacing Pain Intervention(s): Limited activity within patient's tolerance    Home Living Family/patient expects to be discharged to:: Private residence Living Arrangements: Spouse/significant other Available Help at Discharge: Family;Available 24 hours/day Type of Home: House Home Access: Stairs to enter Entrance Stairs-Rails: None Entrance Stairs-Number of Steps: 2 Home Layout: One level Home Equipment: Cane - single point      Prior Function Level of Independence: Independent with assistive device(s)(ambulates with cane)         Comments: Works in [employer] 8 hours daily M-F     Hand Dominance   Dominant Hand: Left    Extremity/Trunk Assessment   Upper Extremity Assessment Upper Extremity Assessment: Overall WFL for tasks assessed    Lower Extremity Assessment Lower Extremity Assessment: Generalized weakness    Cervical / Trunk Assessment Cervical /  Trunk Assessment: Normal  Communication   Communication: No difficulties  Cognition Arousal/Alertness:  Awake/alert Behavior During Therapy: WFL for tasks assessed/performed Overall Cognitive Status: Within Functional Limits for tasks assessed                                 General Comments: Pt able to answer all PLOF questions.      General Comments General comments (skin integrity, edema, etc.): pt on 3L [place] upon PT arrival, PT attempts to wean O2 but pt desats to 83% on RA. Pt desats into mid 80s on 2L [place]. Pt also reading with desaturation into 70s on 3L [place], however PT believes this is an inaccurate reading as pt conversing without distress during ambulation. Pt SpO2 reading 94% on 3L [place] immediately upon return to sitting at edge of bed    Exercises     Assessment/Plan    PT Assessment Patient needs continued PT services  PT Problem List Decreased strength;Decreased activity tolerance;Decreased balance;Decreased mobility;Decreased cognition;Decreased knowledge of use of DME;Decreased safety awareness;Decreased knowledge of precautions       PT Treatment Interventions DME instruction;Gait training;Stair training;Functional mobility training;Therapeutic activities;Therapeutic exercise;Balance training;Neuromuscular re-education;Patient/family education    PT Goals (Current goals can be found in the Care Plan section)  Acute Rehab PT Goals Patient Stated Goal: To improve mobility and go home PT Goal Formulation: With patient Time For Goal Achievement: 03/25/19 Potential to Achieve Goals: Good    Frequency Min 3X/week   Barriers to discharge        Co-evaluation               AM-PAC PT "6 Clicks" Mobility  Outcome Measure Help needed turning from your back to your side while in a flat bed without using bedrails?: A Little Help needed moving from lying on your back to sitting on the side of a flat bed without using bedrails?: A Lot Help needed moving to and from a bed to a chair (including a wheelchair)?: A Little Help needed standing up from a chair using  your arms (e.g., wheelchair or bedside chair)?: A Little Help needed to walk in hospital room?: None Help needed climbing 3-5 steps with a railing? : A Lot 6 Click Score: 17    End of Session Equipment Utilized During Treatment: Oxygen Activity Tolerance: Patient tolerated treatment well Patient left: in bed;with call bell/phone within reach;with bed alarm set Nurse Communication: Mobility status PT Visit Diagnosis: Unsteadiness on feet (R26.81)    Time: [id number] PT Time Calculation (min) (ACUTE ONLY): 34 min   Charges:   PT Evaluation $PT Eval Moderate Complexity: 1 Mod PT Treatments $Gait Training: 8-22 mins        Zenaida Niece, PT, DPT Acute Rehabilitation Pager: [phone]   Zenaida Niece 03/11/2019, 5:13 PM

## 2019-03-11 NOTE — Progress Notes (Signed)
Inpatient Rehabilitation Admissions Coordinator  Inpatient rehab consult received. I will place order for PT and OT evals to assist with assessment of needs before we complete consult for candidacy to admit. I will follow.  Danne Baxter, RN, MSN Rehab Admissions Coordinator [phone] 03/11/2019 11:35 AM

## 2019-03-12 ENCOUNTER — Inpatient Hospital Stay (HOSPITAL_COMMUNITY): Payer: Medicare HMO

## 2019-03-12 LAB — BASIC METABOLIC PANEL
Anion gap: 9 (ref 5–15)
BUN: 13 mg/dL (ref 8–23)
CO2: 34 mmol/L — ABNORMAL HIGH (ref 22–32)
Calcium: 8.1 mg/dL — ABNORMAL LOW (ref 8.9–10.3)
Chloride: 96 mmol/L — ABNORMAL LOW (ref 98–111)
Creatinine, Ser: 0.87 mg/dL (ref 0.61–1.24)
GFR calc Af Amer: >60 mL/min (ref >60)
GFR calc non Af Amer: >60 mL/min (ref >60)
Glucose, Bld: 105 mg/dL — ABNORMAL HIGH (ref 70–99)
Potassium: 4.2 mmol/L (ref 3.5–5.1)
Sodium: 139 mmol/L (ref 135–145)

## 2019-03-12 LAB — CBC
HCT: 25.3 % — ABNORMAL LOW (ref 39.0–52.0)
Hemoglobin: 7.7 g/dL — ABNORMAL LOW (ref 13.0–17.0)
MCH: 28.9 pg (ref 26.0–34.0)
MCHC: 30.4 g/dL (ref 30.0–36.0)
MCV: 95.1 fL (ref 80.0–100.0)
Platelets: 56 10*3/uL — ABNORMAL LOW (ref 150–400)
RBC: 2.66 MIL/uL — ABNORMAL LOW (ref 4.22–5.81)
RDW: 14.2 % (ref 11.5–15.5)
WBC: 5.3 10*3/uL (ref 4.0–10.5)
nRBC: 0 % (ref 0.0–0.2)

## 2019-03-12 LAB — GLUCOSE, CAPILLARY
Glucose-Capillary: 119 mg/dL — ABNORMAL HIGH (ref 70–99)
Glucose-Capillary: 91 mg/dL (ref 70–99)
Glucose-Capillary: 106 mg/dL — ABNORMAL HIGH (ref 70–99)
Glucose-Capillary: 104 mg/dL — ABNORMAL HIGH (ref 70–99)
Glucose-Capillary: 115 mg/dL — ABNORMAL HIGH (ref 70–99)
Glucose-Capillary: 99 mg/dL (ref 70–99)

## 2019-03-12 MED ORDER — LOPERAMIDE HCL 1 MG/7.5ML PO SUSP
4.0000 mg | ORAL | Status: AC | PRN
  Administered 2019-03-12: 4 mg via ORAL
  Filled 2019-03-12 (×2): qty 30

## 2019-03-12 NOTE — Progress Notes (Signed)
Physical Therapy Treatment Patient Details Name: Gregory [name] MRN: [id number] DOB: 10/06/1950 Today's Date: 03/12/2019    History of Present Illness Pt is a 69 yo male s/p CABG x3, aortic valve replacement, radial artery harvest and TEE. PMHx: HTN, HLD, obstructive sleep apnea, chronic lymphedema in BLEs, obesity, back pain, ETOH use.    PT Comments    Pt demonstrating improved stability with walking today. His O2 sats were stable on 2 L [place] with walking and talking.  Pt needing min cues for safety with transfers.  Continues to require mod A for supine/sit due to sternal precautions - discussed sleeping in his power recliner at home.    Follow Up Recommendations  Home health PT;Supervision for mobility/OOB     Equipment Recommendations  Rolling walker with 5" wheels    Recommendations for Other Services       Precautions / Restrictions Precautions Precautions: Fall;Other (comment);Sternal Precaution Comments: O2 Restrictions Other Position/Activity Restrictions: sternal precautions    Mobility  Bed Mobility Overal bed mobility: Needs Assistance       Supine to sit: Mod assist Sit to supine: Min assist   General bed mobility comments: modA for trunk elevation and cues to scoot to EOB; minA for BLE management when getting back in bed - limited due to sternal precautions  Transfers Overall transfer level: Needs assistance Equipment used: Rolling walker (2 wheeled) Transfers: Sit to/from Stand Sit to Stand: Min guard  Pt required A with ADLs and use of RW for balance during ADLs. .         General transfer comment: using momentum; performed x 4  Ambulation/Gait Ambulation/Gait assistance: Supervision Gait Distance (Feet): 400 Feet Assistive device: Rolling walker (2 wheeled) Gait Pattern/deviations: Step-through pattern Gait velocity: reduced   General Gait Details: Cued for RW proximity 1 time, no episodes of bumping into wall today and no  LOB   Stairs             Wheelchair Mobility    Modified Rankin (Stroke Patients Only)       Balance Overall balance assessment: Needs assistance Sitting-balance support: No upper extremity supported;Feet supported Sitting balance-Leahy Scale: Good Sitting balance - Comments: supervision   Standing balance support: During functional activity;No upper extremity supported Standing balance-Leahy Scale: Fair                              Cognition Arousal/Alertness: Awake/alert Behavior During Therapy: WFL for tasks assessed/performed Overall Cognitive Status: Within Functional Limits for tasks assessed                                        Exercises      General Comments General comments (skin integrity, edema, etc.): Pt on 2 L [place] with sats maintaining 93-95% during PT.  Pt was able to carry on conversation with walking without SHOB.  Reports his goal is to get back on his bicycle.      Pertinent Vitals/Pain Pain Assessment: No/denies pain    Home Living                      Prior Function            PT Goals (current goals can now be found in the care plan section) Progress towards PT goals: Progressing toward goals    Frequency  Min 3X/week      PT Plan Current plan remains appropriate    Co-evaluation              AM-PAC PT "6 Clicks" Mobility   Outcome Measure  Help needed turning from your back to your side while in a flat bed without using bedrails?: A Little Help needed moving from lying on your back to sitting on the side of a flat bed without using bedrails?: A Lot Help needed moving to and from a bed to a chair (including a wheelchair)?: None Help needed standing up from a chair using your arms (e.g., wheelchair or bedside chair)?: None Help needed to walk in hospital room?: None Help needed climbing 3-5 steps with a railing? : A Lot 6 Click Score: 19    End of Session Equipment Utilized  During Treatment: Oxygen;Gait belt Activity Tolerance: Patient tolerated treatment well Patient left: in bed;with call bell/phone within reach;with bed alarm set Nurse Communication: Mobility status PT Visit Diagnosis: Unsteadiness on feet (R26.81)     Time: [id number] PT Time Calculation (min) (ACUTE ONLY): 36 min  Charges:  $Gait Training: 8-22 mins $Therapeutic Activity: 8-22 mins                     Maggie Font, PT Acute Rehab Services Pager [phone] [facility] [phone] [facility] [place] 03/12/2019, 4:32 PM

## 2019-03-12 NOTE — Progress Notes (Addendum)
6 Days Post-Op Procedure(s) (LRB): CORONARY ARTERY BYPASS GRAFTING (CABG), ON PUMP, TIMES THREE, USING BILATERAL INTERNAL MAMMARIES AND LEFT RADIAL ARTERY HARVEST (N/A) AORTIC VALVE REPLACEMENT (AVR), USING INTUITY 25MM (N/A) RADIAL ARTERY HARVEST (Left) TRANSESOPHAGEAL ECHOCARDIOGRAM (TEE) (N/A) INDOCYANINE GREEN FLUORESCENCE IMAGING (ICG) (N/A)   Subjective: Complains of headache and shortness of breath this AM.  BM yesterday afternoon.  Remains in SR.   Objective: Vital signs in last 24 hours: Temp:  [97.8 F (36.6 C)-97.9 F (36.6 C)] 97.8 F (36.6 C) (03/11 0221) Pulse Rate:  [70-74] 74 (03/11 0221) Cardiac Rhythm: Normal sinus rhythm;Bundle branch block (03/11 0704) Resp:  [16-25] 25 (03/11 0221) BP: (109-132)/(62-83) 126/74 (03/11 0221) SpO2:  [95 %-100 %] 95 % (03/11 0221) Weight:  [111.5 kg] 111.5 kg (03/11 0221)     Intake/Output from previous day: 03/10 0701 - 03/11 0700 In: -  Out: 350 [Urine:350] Intake/Output this shift: No intake/output data recorded.  Physical Exam: General appearance: alert, cooperative and moderate distress, pale skin. Neurologic: intact Heart: SR with first degreee AVB Lungs: Breath sounds clear. O2 sats 96 on 2Lnc O2. Abdomen: Soft, non-tender, active bowel sounds. Extremities: All are warm and well perfused. Left arm incision is intact and dry.  Wound: the sternal incision is well approximated.   Lab Results: Recent Labs    03/11/19 0234 03/12/19 0212  WBC 6.8 5.3  HGB 7.5* 7.7*  HCT 24.0* 25.3*  PLT 104* 56*   BMET:  Recent Labs    03/11/19 0234 03/12/19 0212  NA 140 139  K 4.1 4.2  CL 99 96*  CO2 33* 34*  GLUCOSE 100* 105*  BUN 15 13  CREATININE 0.77 0.87  CALCIUM 7.7* 8.1*    PT/INR: No results for input(s): LABPROT, INR in the last 72 hours. ABG    Component Value Date/Time   PHART 7.329 (L) 03/07/2019 0639   HCO3 22.3 03/07/2019 0639   TCO2 30 03/08/2019 2319   ACIDBASEDEF 3.0 (H) 03/07/2019 0639   O2SAT 91.0 03/07/2019 0639   CBG (last 3)  Recent Labs    03/11/19 2002 03/11/19 2352 03/12/19 0429  GLUCAP 121* 106* 119*    Assessment/Plan: S/P Procedure(s) (LRB): CORONARY ARTERY BYPASS GRAFTING (CABG), ON PUMP, TIMES THREE, USING BILATERAL INTERNAL MAMMARIES AND LEFT RADIAL ARTERY HARVEST (N/A) AORTIC VALVE REPLACEMENT (AVR), USING INTUITY 25MM (N/A) RADIAL ARTERY HARVEST (Left) TRANSESOPHAGEAL ECHOCARDIOGRAM (TEE) (N/A) INDOCYANINE GREEN FLUORESCENCE IMAGING (ICG) (N/A)  -POD-6 CABG / AVR.  BP stable, on midodrine 5mg  BID. Making slow progress with mobility.  He was evaluated by PT / OT and is felt to be functioning too well to justify inpatient rehab.  Check CXR this AM to eval shortness of breath.   -Post-op A-fib-- Remains in SR with 1st degree AVB. Continue oral amiodarone. K+4.2.  -Expected acute blood loss anemia--may benefit from transfusion due to weakness,fatiguw, and shortness of breath.  Continue Trinsicon and monitor.  -Constipation / indigestion- resolved.  -Thrombocytopenia--Plt count 150K->104K->56K over past 48 hours. Not on heparin or Lovenox. On ASA 325mg /day. No signs of bleeding.   -Endo- glucose is well controlled.    LOS: 8 days    Gregory [name], [place] [phone] 03/12/2019

## 2019-03-12 NOTE — Progress Notes (Signed)
CARDIAC REHAB PHASE I   PRE:  Rate/Rhythm: 80 SR    BP: sitting 151/79    SaO2: 98 3L  MODE:  Ambulation: 400 ft   POST:  Rate/Rhythm: 96 SR    BP: sitting 153/71     SaO2: 92 3L  Pt needed max assist to get to EOB. Attempt x2 to stand with rocking. Slow pace walking, fatigued and SOB. SaO2 90-92 3L. Tended to walk close to right wall. x2 rest stops. To recliner after walk. VSS. Encouraged IS, max 1250 mL. Encouraged strong effort every inspirations. Encouraged x2 more walks today. [place], ACSM 03/12/2019 10:53 AM

## 2019-03-12 NOTE — Plan of Care (Signed)
  Problem: Education: Goal: Knowledge of General Education information will improve Description: Including pain rating scale, medication(s)/side effects and non-pharmacologic comfort measures Outcome: Progressing   Problem: Education: Goal: Knowledge of General Education information will improve Description: Including pain rating scale, medication(s)/side effects and non-pharmacologic comfort measures Outcome: Progressing   Problem: Health Behavior/Discharge Planning: Goal: Ability to manage health-related needs will improve Outcome: Progressing   Problem: Clinical Measurements: Goal: Ability to maintain clinical measurements within normal limits will improve Outcome: Progressing Goal: Will remain free from infection Outcome: Progressing Goal: Diagnostic test results will improve Outcome: Progressing Goal: Respiratory complications will improve Outcome: Progressing Goal: Cardiovascular complication will be avoided Outcome: Progressing   Problem: Activity: Goal: Risk for activity intolerance will decrease Outcome: Progressing   Problem: Nutrition: Goal: Adequate nutrition will be maintained Outcome: Progressing   Problem: Coping: Goal: Level of anxiety will decrease Outcome: Progressing   Problem: Elimination: Goal: Will not experience complications related to bowel motility Outcome: Progressing Goal: Will not experience complications related to urinary retention Outcome: Progressing   Problem: Pain Managment: Goal: General experience of comfort will improve Outcome: Progressing   Problem: Safety: Goal: Ability to remain free from injury will improve Outcome: Progressing   Problem: Skin Integrity: Goal: Risk for impaired skin integrity will decrease Outcome: Progressing   Problem: Education: Goal: Understanding of cardiac disease, CV risk reduction, and recovery process will improve Outcome: Progressing Goal: Understanding of medication regimen will  improve Outcome: Progressing Goal: Individualized Educational Video(s) Outcome: Progressing   Problem: Activity: Goal: Ability to tolerate increased activity will improve Outcome: Progressing   Problem: Cardiac: Goal: Ability to achieve and maintain adequate cardiopulmonary perfusion will improve Outcome: Progressing Goal: Vascular access site(s) Level 0-1 will be maintained Outcome: Progressing   Problem: Health Behavior/Discharge Planning: Goal: Ability to safely manage health-related needs after discharge will improve Outcome: Progressing   Problem: Education: Goal: Will demonstrate proper wound care and an understanding of methods to prevent future damage Outcome: Progressing Goal: Knowledge of disease or condition will improve Outcome: Progressing Goal: Knowledge of the prescribed therapeutic regimen will improve Outcome: Progressing Goal: Individualized Educational Video(s) Outcome: Progressing   Problem: Activity: Goal: Risk for activity intolerance will decrease Outcome: Progressing   Problem: Cardiac: Goal: Will achieve and/or maintain hemodynamic stability Outcome: Progressing   Problem: Clinical Measurements: Goal: Postoperative complications will be avoided or minimized Outcome: Progressing   Problem: Respiratory: Goal: Respiratory status will improve Outcome: Progressing   Problem: Skin Integrity: Goal: Wound healing without signs and symptoms of infection Outcome: Progressing Goal: Risk for impaired skin integrity will decrease Outcome: Progressing   Problem: Urinary Elimination: Goal: Ability to achieve and maintain adequate renal perfusion and functioning will improve Outcome: Progressing   Problem: Activity: Goal: Risk for activity intolerance will decrease Outcome: Progressing   Problem: Cardiac: Goal: Will achieve and/or maintain hemodynamic stability Outcome: Progressing   Problem: Respiratory: Goal: Respiratory status will  improve Outcome: Progressing   Problem: Urinary Elimination: Goal: Ability to achieve and maintain adequate renal perfusion and functioning will improve Outcome: Progressing

## 2019-03-12 NOTE — Plan of Care (Signed)
  Problem: Education: Goal: Knowledge of General Education information will improve Description: Including pain rating scale, medication(s)/side effects and non-pharmacologic comfort measures Outcome: Progressing   Problem: Education: Goal: Knowledge of General Education information will improve Description: Including pain rating scale, medication(s)/side effects and non-pharmacologic comfort measures Outcome: Progressing   Problem: Health Behavior/Discharge Planning: Goal: Ability to manage health-related needs will improve Outcome: Progressing   Problem: Clinical Measurements: Goal: Ability to maintain clinical measurements within normal limits will improve Outcome: Progressing Goal: Will remain free from infection Outcome: Progressing Goal: Diagnostic test results will improve Outcome: Progressing Goal: Respiratory complications will improve Outcome: Progressing Goal: Cardiovascular complication will be avoided Outcome: Progressing   Problem: Activity: Goal: Risk for activity intolerance will decrease Outcome: Progressing   Problem: Nutrition: Goal: Adequate nutrition will be maintained Outcome: Progressing   Problem: Coping: Goal: Level of anxiety will decrease Outcome: Progressing   Problem: Elimination: Goal: Will not experience complications related to bowel motility Outcome: Progressing Goal: Will not experience complications related to urinary retention Outcome: Progressing   Problem: Pain Managment: Goal: General experience of comfort will improve Outcome: Progressing   Problem: Safety: Goal: Ability to remain free from injury will improve Outcome: Progressing   Problem: Skin Integrity: Goal: Risk for impaired skin integrity will decrease Outcome: Progressing   Problem: Education: Goal: Understanding of cardiac disease, CV risk reduction, and recovery process will improve Outcome: Progressing Goal: Understanding of medication regimen will  improve Outcome: Progressing Goal: Individualized Educational Video(s) Outcome: Progressing   Problem: Activity: Goal: Ability to tolerate increased activity will improve Outcome: Progressing   Problem: Cardiac: Goal: Ability to achieve and maintain adequate cardiopulmonary perfusion will improve Outcome: Progressing Goal: Vascular access site(s) Level 0-1 will be maintained Outcome: Progressing   Problem: Health Behavior/Discharge Planning: Goal: Ability to safely manage health-related needs after discharge will improve Outcome: Progressing   Problem: Education: Goal: Will demonstrate proper wound care and an understanding of methods to prevent future damage Outcome: Progressing Goal: Knowledge of disease or condition will improve Outcome: Progressing Goal: Knowledge of the prescribed therapeutic regimen will improve Outcome: Progressing Goal: Individualized Educational Video(s) Outcome: Progressing   Problem: Activity: Goal: Risk for activity intolerance will decrease Outcome: Progressing   Problem: Cardiac: Goal: Will achieve and/or maintain hemodynamic stability Outcome: Progressing   Problem: Clinical Measurements: Goal: Postoperative complications will be avoided or minimized Outcome: Progressing   Problem: Respiratory: Goal: Respiratory status will improve Outcome: Progressing   Problem: Skin Integrity: Goal: Wound healing without signs and symptoms of infection Outcome: Progressing Goal: Risk for impaired skin integrity will decrease Outcome: Progressing   Problem: Urinary Elimination: Goal: Ability to achieve and maintain adequate renal perfusion and functioning will improve Outcome: Progressing   Problem: Activity: Goal: Risk for activity intolerance will decrease Outcome: Progressing   Problem: Cardiac: Goal: Will achieve and/or maintain hemodynamic stability Outcome: Progressing   Problem: Respiratory: Goal: Respiratory status will  improve Outcome: Progressing

## 2019-03-13 ENCOUNTER — Inpatient Hospital Stay (HOSPITAL_COMMUNITY): Payer: Medicare HMO

## 2019-03-13 ENCOUNTER — Telehealth: Payer: Self-pay | Admitting: Cardiology

## 2019-03-13 LAB — BASIC METABOLIC PANEL
Anion gap: 11 (ref 5–15)
BUN: 17 mg/dL (ref 8–23)
CO2: 34 mmol/L — ABNORMAL HIGH (ref 22–32)
Calcium: 8.5 mg/dL — ABNORMAL LOW (ref 8.9–10.3)
Chloride: 94 mmol/L — ABNORMAL LOW (ref 98–111)
Creatinine, Ser: 0.9 mg/dL (ref 0.61–1.24)
GFR calc Af Amer: >60 mL/min (ref >60)
GFR calc non Af Amer: >60 mL/min (ref >60)
Glucose, Bld: 106 mg/dL — ABNORMAL HIGH (ref 70–99)
Potassium: 4.3 mmol/L (ref 3.5–5.1)
Sodium: 139 mmol/L (ref 135–145)

## 2019-03-13 LAB — CBC
HCT: 28.4 % — ABNORMAL LOW (ref 39.0–52.0)
Hemoglobin: 8.8 g/dL — ABNORMAL LOW (ref 13.0–17.0)
MCH: 29.7 pg (ref 26.0–34.0)
MCHC: 31 g/dL (ref 30.0–36.0)
MCV: 95.9 fL (ref 80.0–100.0)
Platelets: 30 10*3/uL — ABNORMAL LOW (ref 150–400)
RBC: 2.96 MIL/uL — ABNORMAL LOW (ref 4.22–5.81)
RDW: 14.3 % (ref 11.5–15.5)
WBC: 5.7 10*3/uL (ref 4.0–10.5)
nRBC: 0 % (ref 0.0–0.2)

## 2019-03-13 LAB — GLUCOSE, CAPILLARY
Glucose-Capillary: 104 mg/dL — ABNORMAL HIGH (ref 70–99)
Glucose-Capillary: 135 mg/dL — ABNORMAL HIGH (ref 70–99)
Glucose-Capillary: 91 mg/dL (ref 70–99)
Glucose-Capillary: 109 mg/dL — ABNORMAL HIGH (ref 70–99)
Glucose-Capillary: 129 mg/dL — ABNORMAL HIGH (ref 70–99)

## 2019-03-13 MED ORDER — INSULIN ASPART 100 UNIT/ML ~~LOC~~ SOLN: 0.0000 [IU] | SUBCUTANEOUS | Status: DC

## 2019-03-13 MED ORDER — CHLORHEXIDINE GLUCONATE 0.12 % MT SOLN
15.0000 mL | Freq: Two times a day (BID) | OROMUCOSAL | Status: DC
  Administered 2019-03-14 – 2019-03-19 (×12): 15 mL via OROMUCOSAL
  Filled 2019-03-13 (×12): qty 15

## 2019-03-13 MED ORDER — INSULIN ASPART 100 UNIT/ML ~~LOC~~ SOLN
0.0000 [IU] | Freq: Three times a day (TID) | SUBCUTANEOUS | Status: DC
  Administered 2019-03-18 (×2): 2 [IU] via SUBCUTANEOUS

## 2019-03-13 MED ORDER — MIDODRINE HCL 5 MG PO TABS
2.5000 mg | ORAL_TABLET | Freq: Three times a day (TID) | ORAL | Status: DC
  Administered 2019-03-13 – 2019-03-20 (×18): 2.5 mg via ORAL
  Filled 2019-03-13 (×19): qty 1

## 2019-03-13 NOTE — Progress Notes (Signed)
Occupational Therapy Treatment Patient Details Name: Gregory [name] MRN: [id number] DOB: 1950/03/05 Today's Date: 03/13/2019    History of present illness Pt is a 69 yo male s/p CABG x3, aortic valve replacement, radial artery harvest and TEE. PMHx: HTN, HLD, obstructive sleep apnea, chronic lymphedema in BLEs, obesity, back pain, ETOH use.   OT comments  Pt progressing toward stated goals. Reports increased fatigue this date due to not feeling well, with emesis and HA earlier this date. Pt completed sit <> stands with min A and momentum. Cues and education given on sternal precautions. Pt completed household level of functional mobility with min guard assist and cues for safety. Pt taking very small, slow and careful steps this date. 4L [place] required for functional mobility with cues for pursed lip breathing and ECS strategies. D/c recs remain appropriate. Will continue to follow.   Follow Up Recommendations  Home health OT;Supervision/Assistance - 24 hour    Equipment Recommendations  3 in 1 bedside commode    Recommendations for Other Services      Precautions / Restrictions Precautions Precautions: Fall;Other (comment);Sternal Precaution Booklet Issued: (reviewed in context of BADL) Precaution Comments: watch O2       Mobility Bed Mobility               General bed mobility comments: sitting EOB upon arrival  Transfers Overall transfer level: Needs assistance Equipment used: Rolling walker (2 wheeled) Transfers: Sit to/from Stand Sit to Stand: Min assist         General transfer comment: using momentum and heart pillow, min A to rise and steady then palcing hands on RW    Balance Overall balance assessment: Needs assistance Sitting-balance support: No upper extremity supported;Feet supported Sitting balance-Leahy Scale: Good     Standing balance support: During functional activity;No upper extremity supported Standing balance-Leahy Scale: Poor Standing  balance comment: reliant on external support                           ADL either performed or assessed with clinical judgement   ADL Overall ADL's : Needs assistance/impaired Eating/Feeding: Set up;Sitting Eating/Feeding Details (indicate cue type and reason): to feed self jello Grooming: Min guard;Standing Grooming Details (indicate cue type and reason): cues to maintain sternal precuations when reaching up to face             Lower Body Dressing: Maximal assistance;Sitting/lateral leans;Sit to/from stand Lower Body Dressing Details (indicate cue type and reason): to don socks Toilet Transfer: Minimal assistance;RW;Ambulation Toilet Transfer Details (indicate cue type and reason): needing momentum to engage in transfer while following sternal precautions Toileting- Clothing Manipulation and Hygiene: Moderate assistance;Maximal assistance;Cueing for safety;Sitting/lateral lean;Sit to/from stand       Functional mobility during ADLs: Min guard;Rolling walker;Cueing for safety General ADL Comments: progressed to increased engagement in BADL mobility with ECS strategies     Vision Patient Visual Report: No change from baseline     Perception     Praxis      Cognition Arousal/Alertness: Awake/alert Behavior During Therapy: WFL for tasks assessed/performed Overall Cognitive Status: Within Functional Limits for tasks assessed                                          Exercises     Shoulder Instructions       General Comments  Pertinent Vitals/ Pain       Pain Assessment: No/denies pain  Home Living                                          Prior Functioning/Environment              Frequency  Min 2X/week        Progress Toward Goals  OT Goals(current goals can now be found in the care plan section)  Progress towards OT goals: Progressing toward goals  Acute Rehab OT Goals Patient Stated Goal: To  improve ability to take care of self OT Goal Formulation: With patient Time For Goal Achievement: 03/25/19 Potential to Achieve Goals: Good  Plan Discharge plan remains appropriate    Co-evaluation                 AM-PAC OT "6 Clicks" Daily Activity     Outcome Measure   Help from another person eating meals?: None Help from another person taking care of personal grooming?: A Little Help from another person toileting, which includes using toliet, bedpan, or urinal?: A Little Help from another person bathing (including washing, rinsing, drying)?: A Lot Help from another person to put on and taking off regular upper body clothing?: A Lot Help from another person to put on and taking off regular lower body clothing?: Total 6 Click Score: 15    End of Session Equipment Utilized During Treatment: Rolling walker  OT Visit Diagnosis: Unsteadiness on feet (R26.81);Muscle weakness (generalized) (M62.81)   Activity Tolerance Patient tolerated treatment well   Patient Left in chair;with call bell/phone within reach;with family/visitor present   Nurse Communication Mobility status        Time: [id number] OT Time Calculation (min): 23 min  Charges: OT General Charges $OT Visit: 1 Visit OT Treatments $Self Care/Home Management : 23-37 mins  Zenovia Jarred, MSOT, OTR/L [facility] Office Number: [phone] Pager: [phone]  Zenovia Jarred 03/13/2019, 5:56 PM

## 2019-03-13 NOTE — Telephone Encounter (Signed)
  Wife is calling because patient had open heart surgery last Friday 03/06/19 and she would like to speak to Dr Percival Spanish in regards to what is going on with him. She would like some advice

## 2019-03-13 NOTE — Telephone Encounter (Signed)
Spoke to pt wife per DPR. States that the pt had open heart surgery two days ago and they drew labs yesterday and the pt platelet count dropped to 56. She stated the dr's discussed doing a tranfsfusion but has not heard anything else about it today and they have not gotten labs drawn today either. Pt wife is very frustrated and wants to know if Dr. Percival Spanish can have any input on pt. Will forward to Dr. Percival Spanish for review.

## 2019-03-13 NOTE — Telephone Encounter (Signed)
Wife returned call.

## 2019-03-13 NOTE — Plan of Care (Signed)
  Problem: Education: Goal: Knowledge of General Education information will improve Description: Including pain rating scale, medication(s)/side effects and non-pharmacologic comfort measures Outcome: Progressing   Problem: Education: Goal: Knowledge of General Education information will improve Description: Including pain rating scale, medication(s)/side effects and non-pharmacologic comfort measures Outcome: Progressing   Problem: Health Behavior/Discharge Planning: Goal: Ability to manage health-related needs will improve Outcome: Progressing   Problem: Clinical Measurements: Goal: Ability to maintain clinical measurements within normal limits will improve Outcome: Progressing Goal: Will remain free from infection Outcome: Progressing Goal: Diagnostic test results will improve Outcome: Progressing Goal: Respiratory complications will improve Outcome: Progressing Goal: Cardiovascular complication will be avoided Outcome: Progressing   Problem: Activity: Goal: Risk for activity intolerance will decrease Outcome: Progressing   Problem: Nutrition: Goal: Adequate nutrition will be maintained Outcome: Progressing   Problem: Coping: Goal: Level of anxiety will decrease Outcome: Progressing   Problem: Elimination: Goal: Will not experience complications related to bowel motility Outcome: Progressing Goal: Will not experience complications related to urinary retention Outcome: Progressing   Problem: Pain Managment: Goal: General experience of comfort will improve Outcome: Progressing   Problem: Safety: Goal: Ability to remain free from injury will improve Outcome: Progressing   Problem: Skin Integrity: Goal: Risk for impaired skin integrity will decrease Outcome: Progressing   Problem: Education: Goal: Understanding of cardiac disease, CV risk reduction, and recovery process will improve Outcome: Progressing Goal: Understanding of medication regimen will  improve Outcome: Progressing Goal: Individualized Educational Video(s) Outcome: Progressing   Problem: Activity: Goal: Ability to tolerate increased activity will improve Outcome: Progressing   Problem: Cardiac: Goal: Ability to achieve and maintain adequate cardiopulmonary perfusion will improve Outcome: Progressing Goal: Vascular access site(s) Level 0-1 will be maintained Outcome: Progressing   Problem: Health Behavior/Discharge Planning: Goal: Ability to safely manage health-related needs after discharge will improve Outcome: Progressing   Problem: Education: Goal: Will demonstrate proper wound care and an understanding of methods to prevent future damage Outcome: Progressing Goal: Knowledge of disease or condition will improve Outcome: Progressing Goal: Knowledge of the prescribed therapeutic regimen will improve Outcome: Progressing Goal: Individualized Educational Video(s) Outcome: Progressing   Problem: Activity: Goal: Risk for activity intolerance will decrease Outcome: Progressing   Problem: Cardiac: Goal: Will achieve and/or maintain hemodynamic stability Outcome: Progressing   Problem: Clinical Measurements: Goal: Postoperative complications will be avoided or minimized Outcome: Progressing   Problem: Respiratory: Goal: Respiratory status will improve Outcome: Progressing   Problem: Skin Integrity: Goal: Wound healing without signs and symptoms of infection Outcome: Progressing Goal: Risk for impaired skin integrity will decrease Outcome: Progressing   Problem: Urinary Elimination: Goal: Ability to achieve and maintain adequate renal perfusion and functioning will improve Outcome: Progressing   Problem: Activity: Goal: Risk for activity intolerance will decrease Outcome: Progressing   Problem: Cardiac: Goal: Will achieve and/or maintain hemodynamic stability Outcome: Progressing   Problem: Respiratory: Goal: Respiratory status will  improve Outcome: Progressing   Problem: Urinary Elimination: Goal: Ability to achieve and maintain adequate renal perfusion and functioning will improve Outcome: Progressing

## 2019-03-13 NOTE — Progress Notes (Signed)
7 Days Post-Op Procedure(s) (LRB): CORONARY ARTERY BYPASS GRAFTING (CABG), ON PUMP, TIMES THREE, USING BILATERAL INTERNAL MAMMARIES AND LEFT RADIAL ARTERY HARVEST (N/A) AORTIC VALVE REPLACEMENT (AVR), USING INTUITY 25MM (N/A) RADIAL ARTERY HARVEST (Left) TRANSESOPHAGEAL ECHOCARDIOGRAM (TEE) (N/A) INDOCYANINE GREEN FLUORESCENCE IMAGING (ICG) (N/A) Subjective: Sitting up in the bedside chair. Says he had a better day yesterday but that he doesn't sleep well at night in general.  O2 at 2L, [place].   Objective: Vital signs in last 24 hours: Temp:  [97.7 F (36.5 C)-98.2 F (36.8 C)] 97.7 F (36.5 C) (03/12 0735) Pulse Rate:  [68-80] 72 (03/12 0735) Cardiac Rhythm: Normal sinus rhythm;Bundle branch block (03/12 0700) Resp:  [14-27] 20 (03/12 0735) BP: (103-133)/(61-82) 133/61 (03/12 0735) SpO2:  [93 %-98 %] 93 % (03/12 0735) Weight:  [id number] kg] 109 kg (03/12 0610)     Intake/Output from previous day: 03/11 0701 - 03/12 0700 In: 600 [P.O.:600] Out: 1600 [Urine:1600] Intake/Output this shift: No intake/output data recorded.  Physical Exam: General appearance:alert, cooperative and no distress. Neurologic:intact Heart:SR with first degreee AVB Lungs:Breath sounds clear. O2 sats 96 on 2Lnc O2. Abdomen:Soft, non-tender, active bowel sounds. Extremities:All are warm and well perfused. Left arm incision is intact and dry. Wound:the sternal incision is well approximated.  Lab Results: Recent Labs    03/11/19 0234 03/12/19 0212  WBC 6.8 5.3  HGB 7.5* 7.7*  HCT 24.0* 25.3*  PLT 104* 56*   BMET:  Recent Labs    03/11/19 0234 03/12/19 0212  NA 140 139  K 4.1 4.2  CL 99 96*  CO2 33* 34*  GLUCOSE 100* 105*  BUN 15 13  CREATININE 0.77 0.87  CALCIUM 7.7* 8.1*    PT/INR: No results for input(s): LABPROT, INR in the last 72 hours. ABG    Component Value Date/Time   PHART 7.329 (L) 03/07/2019 0639   HCO3 22.3 03/07/2019 0639   TCO2 30 03/08/2019 2319   ACIDBASEDEF  3.0 (H) 03/07/2019 0639   O2SAT 91.0 03/07/2019 0639   CBG (last 3)  Recent Labs    03/12/19 2327 03/13/19 0434 03/13/19 0626  GLUCAP 99 104* 135*    Assessment/Plan: S/P Procedure(s) (LRB): CORONARY ARTERY BYPASS GRAFTING (CABG), ON PUMP, TIMES THREE, USING BILATERAL INTERNAL MAMMARIES AND LEFT RADIAL ARTERY HARVEST (N/A) AORTIC VALVE REPLACEMENT (AVR), USING INTUITY 25MM (N/A) RADIAL ARTERY HARVEST (Left) TRANSESOPHAGEAL ECHOCARDIOGRAM (TEE) (N/A) INDOCYANINE GREEN FLUORESCENCE IMAGING (ICG) (N/A)   -POD-7 CABG / AVR.  BP acceptable, on midodrine 5mg  BID. Making slow progress with mobility.  He was evaluated by PT / OT and is felt to be functioning too well to justify inpatient rehab.     -Post-op A-fib-- Remains in SR with 1st degree AVB. Continue oral amiodarone. .  -Expected acute blood loss anemia--may benefit from transfusion due to weakness,fatiguw, and shortness of breath.  AM labs pending. Continue Trinsicon and monitor.  -Constipation / indigestion- resolved.  -Thrombocytopenia--Plt count  56K yesterday. AM< lab pending.  Not on heparin or Lovenox. On ASA 325mg /day. No signs of bleeding.   -Endo- glucose is well controlled.    LOS: 9 days    Gregory [name], [place] [phone] 03/13/2019

## 2019-03-13 NOTE — Telephone Encounter (Signed)
LM2CB 

## 2019-03-13 NOTE — Progress Notes (Signed)
Checked back with pt. Now sleeping soundly. Per wife and RN recently c/o HA and vomiting. Will let him sleep now. Discussed with wife encouraging him to walk later and practice IS. Will f/u as time allows. [place], ACSM 1:49 PM 03/13/2019

## 2019-03-13 NOTE — Progress Notes (Signed)
Came to ambulate after pt got a bath. Very exerted/SOB. Can only complete 2-3 words while talking. SaO2 slow to register. 90-96 4L on EOB. Max assist to get to bed (minimal pt effort). Once settled, less SOB, VSS. Will f/u later.  [id number] [place], ACSM 10:22 AM 03/13/2019

## 2019-03-14 LAB — CBC
HCT: 26.2 % — ABNORMAL LOW (ref 39.0–52.0)
Hemoglobin: 8.2 g/dL — ABNORMAL LOW (ref 13.0–17.0)
MCH: 29.5 pg (ref 26.0–34.0)
MCHC: 31.3 g/dL (ref 30.0–36.0)
MCV: 94.2 fL (ref 80.0–100.0)
Platelets: 23 10*3/uL — CL (ref 150–400)
RBC: 2.78 MIL/uL — ABNORMAL LOW (ref 4.22–5.81)
RDW: 14.5 % (ref 11.5–15.5)
WBC: 6 10*3/uL (ref 4.0–10.5)
nRBC: 0 % (ref 0.0–0.2)

## 2019-03-14 LAB — BASIC METABOLIC PANEL
Anion gap: 13 (ref 5–15)
BUN: 17 mg/dL (ref 8–23)
CO2: 32 mmol/L (ref 22–32)
Calcium: 8.3 mg/dL — ABNORMAL LOW (ref 8.9–10.3)
Chloride: 93 mmol/L — ABNORMAL LOW (ref 98–111)
Creatinine, Ser: 0.85 mg/dL (ref 0.61–1.24)
GFR calc Af Amer: >60 mL/min (ref >60)
GFR calc non Af Amer: >60 mL/min (ref >60)
Glucose, Bld: 101 mg/dL — ABNORMAL HIGH (ref 70–99)
Potassium: 4.5 mmol/L (ref 3.5–5.1)
Sodium: 138 mmol/L (ref 135–145)

## 2019-03-14 LAB — HEPARIN INDUCED PLATELET AB (HIT ANTIBODY): Heparin Induced Plt Ab: 3.136 OD — ABNORMAL HIGH (ref 0.000–0.400)

## 2019-03-14 LAB — GLUCOSE, CAPILLARY
Glucose-Capillary: 92 mg/dL (ref 70–99)
Glucose-Capillary: 92 mg/dL (ref 70–99)
Glucose-Capillary: 111 mg/dL — ABNORMAL HIGH (ref 70–99)
Glucose-Capillary: 107 mg/dL — ABNORMAL HIGH (ref 70–99)

## 2019-03-14 LAB — APTT
aPTT: 41 seconds — ABNORMAL HIGH (ref 24–36)
aPTT: 73 seconds — ABNORMAL HIGH (ref 24–36)

## 2019-03-14 LAB — PREPARE RBC (CROSSMATCH)

## 2019-03-14 MED ORDER — ARGATROBAN 50 MG/50ML IV SOLN
0.5000 ug/kg/min | INTRAVENOUS | Status: DC
  Administered 2019-03-14 – 2019-03-19 (×9): 0.5 ug/kg/min via INTRAVENOUS
  Filled 2019-03-14 (×12): qty 50

## 2019-03-14 MED ORDER — SODIUM CHLORIDE 0.9% FLUSH
3.0000 mL | Freq: Two times a day (BID) | INTRAVENOUS | Status: DC
  Administered 2019-03-14 – 2019-03-19 (×11): 3 mL via INTRAVENOUS

## 2019-03-14 MED ORDER — SODIUM CHLORIDE 0.9 % IV SOLN: 250.0000 mL | INTRAVENOUS | Status: DC | PRN

## 2019-03-14 MED ORDER — AMIODARONE IV BOLUS ONLY 150 MG/100ML
150.0000 mg | Freq: Once | INTRAVENOUS | Status: DC
  Filled 2019-03-14 (×2): qty 100

## 2019-03-14 MED ORDER — SODIUM CHLORIDE 0.9% IV SOLUTION: Freq: Once | INTRAVENOUS | Status: DC

## 2019-03-14 MED ORDER — SODIUM CHLORIDE 0.9% FLUSH
3.0000 mL | INTRAVENOUS | Status: DC | PRN
  Administered 2019-03-15: 3 mL via INTRAVENOUS

## 2019-03-14 NOTE — Progress Notes (Signed)
Dr. Orvan Seen notified of patients Platelet count down to 23 this morning. Orders received for a type and screen and transfuse 1 unit of PRBC's. Lab notified to do the type and screen. Patient has consent signed from March 05 2019 this admission. Will complete  this order when lab is sent to blood bank. Patient resting in bed.

## 2019-03-14 NOTE — Progress Notes (Signed)
ANTICOAGULATION CONSULT NOTE - Initial Consult  Pharmacy Consult for argatroban Indication: R/o HIT  Allergies  Allergen Reactions  . Heparin     HIT antibody pending 3/13    Patient Measurements: Height: 5\' 7"  (170.2 cm) Weight: 235 lb 14.3 oz (107 kg) IBW/kg (Calculated) : 66.1  Vital Signs: Temp: 97.7 F (36.5 C) (03/13 0808) Temp Source: Oral (03/13 0808) BP: 135/89 (03/13 0808) Pulse Rate: 80 (03/13 0814)  Labs: Recent Labs    03/12/19 0212 03/12/19 0212 03/13/19 0846 03/14/19 0226  HGB 7.7*   < > 8.8* 8.2*  HCT 25.3*  --  28.4* 26.2*  PLT 56*  --  30* 23*  CREATININE 0.87  --  0.90 0.85   < > = values in this interval not displayed.    Estimated Creatinine Clearance: 94.4 mL/min (by C-G formula based on SCr of 0.85 mg/dL).   Medical History: Past Medical History:  Diagnosis Date  . Alcohol abuse   . Aortic valve disease    AI and AS, Moderate  . Chest pain 03/2019  . Drug use   . Edema of both lower extremities   . Glaucoma   . Hypertension   . OSA (obstructive sleep apnea)   . Shingles     Medications:  Infusions:  . sodium chloride    . argatroban      Assessment: 69 yo male admitted with ACS, now s/p CABG with dropping platelet count.  Baseline 227.  Heparin exposure 3/3-3/5, platelets dropped to 146 on 3/6, but then increased to 150 on 3/9.  As of 3/10 have continued to trend down. Platelet count today down to 23 and pharmacy asked to begin argatroban per possible HIT protocol.  Goal of Therapy:  aPTT 50-90 seconds Monitor platelets by anticoagulation protocol: Yes   Plan:  1. Start argatroban at 0.5 mcg/kg/min. 2. Check aPTT 2 hrs after gtt started. 3. Daily aPTT and CBC. 4.  F/u HIT labs as available.  Antibody and SRA pending.  Marguerite Olea, [facility] Clinical Pharmacist Phone [phone]  03/14/2019 8:34 AM

## 2019-03-14 NOTE — Progress Notes (Signed)
Pharmacy Heparin Induced Thrombocytopenia (HIT) Note:  Gregory [name] is an 69 y.o. male being evaluated for HIT. Heparin was started 3/3 for ACS, and baseline platelets were 227.   HIT labs were ordered on 3/13 when platelets dropped to 23.  Auto-populate labs: No results found for: HEPINDPLTAB, SRALOWDOSEHP, SRAHIGHDOSEH   CALCULATE SCORE:  4Ts (see the HIT Algorithm) Score  Thrombocytopenia 1  Timing 0  Thrombosis 0  Other causes of thrombocytopenia 0  Total 1     Recommendations (A or B) are based on available lab results (HIT antibody and/or SRA) and the HIT algorithm    A. HIT antibody result available-  In process  B. SRA result availability - in process  Name of MD Contacted: [facility] (Discussed with provider) Labs ordered:  SRA ordered  Heparin allergy:  Heparin allergy documented or updated. Anticoagulation plans:  Begin alternative anticoagulation with argatroban  Marguerite Olea, [facility] Clinical Pharmacist Phone [phone]  03/14/2019 7:45 AM

## 2019-03-14 NOTE — Progress Notes (Signed)
CRITICAL VALUE ALERT  Critical Value:  Platelets = 20  Date & Time Notied:  03/14/2019  Provider Notified: Dr. Orvan Seen  Orders Received/Actions taken: Amiodarone bolus held as per Dr. Orvan Seen as he has converted back to NSR with HR of 80s at approximately 1312.

## 2019-03-14 NOTE — Progress Notes (Signed)
8 Days Post-Op Procedure(s) (LRB): CORONARY ARTERY BYPASS GRAFTING (CABG), ON PUMP, TIMES THREE, USING BILATERAL INTERNAL MAMMARIES AND LEFT RADIAL ARTERY HARVEST (N/A) AORTIC VALVE REPLACEMENT (AVR), USING INTUITY 25MM (N/A) RADIAL ARTERY HARVEST (Left) TRANSESOPHAGEAL ECHOCARDIOGRAM (TEE) (N/A) INDOCYANINE GREEN FLUORESCENCE IMAGING (ICG) (N/A) Subjective: Says he feels poorly today, still has a headache. He has no new complaints.  Objective: Vital signs in last 24 hours: Temp:  [97.7 F (36.5 C)-98.2 F (36.8 C)] 98.2 F (36.8 C) (03/13 0316) Pulse Rate:  [69-89] 71 (03/13 0400) Cardiac Rhythm: Normal sinus rhythm (03/13 0400) Resp:  [12-26] 19 (03/13 0400) BP: (89-144)/(54-89) 142/89 (03/13 0316) SpO2:  [93 %-96 %] 94 % (03/13 0400) Weight:  ST:[id number] kg] 107 kg (03/13 0605)    Intake/Output from previous day: 03/12 0701 - 03/13 0700 In: 400 [P.O.:400] Out: 2975 [Urine:2975] Intake/Output this shift: No intake/output data recorded.  Physical Exam: General appearance:Sleeping but awakened easily. Hs is alert, cooperative and milddistress. Neurologic:intact Heart:SR with first degreee AVB Lungs:Breath sounds clear. O2 sats96on 3Lnc O2. Abdomen:Soft, non-tender, active bowel sounds. Extremities:All are warm and well perfused. Left arm incision is intact and dry. Wound:the sternal incision is well approximated.  Lab Results: Recent Labs    03/13/19 0846 03/14/19 0226  WBC 5.7 6.0  HGB 8.8* 8.2*  HCT 28.4* 26.2*  PLT 30* 23*   BMET:  Recent Labs    03/13/19 0846 03/14/19 0226  NA 139 138  K 4.3 4.5  CL 94* 93*  CO2 34* 32  GLUCOSE 106* 101*  BUN 17 17  CREATININE 0.90 0.85  CALCIUM 8.5* 8.3*    PT/INR: No results for input(s): LABPROT, INR in the last 72 hours. ABG    Component Value Date/Time   PHART 7.329 (L) 03/07/2019 0639   HCO3 22.3 03/07/2019 0639   TCO2 30 03/08/2019 2319   ACIDBASEDEF 3.0 (H) 03/07/2019 0639   O2SAT 91.0 03/07/2019  0639   CBG (last 3)  Recent Labs    03/13/19 1603 03/13/19 2132 03/14/19 0601  GLUCAP 109* 129* 92    Assessment/Plan: S/P Procedure(s) (LRB): CORONARY ARTERY BYPASS GRAFTING (CABG), ON PUMP, TIMES THREE, USING BILATERAL INTERNAL MAMMARIES AND LEFT RADIAL ARTERY HARVEST (N/A) AORTIC VALVE REPLACEMENT (AVR), USING INTUITY 25MM (N/A) RADIAL ARTERY HARVEST (Left) TRANSESOPHAGEAL ECHOCARDIOGRAM (TEE) (N/A) INDOCYANINE GREEN FLUORESCENCE IMAGING (ICG) (N/A)  -POD-8CABG / AVR. BP trending up,  Stop midodrine today.  Making slow progress with mobility. He was evaluated by PT / OT and is felt to be functioning too well to justify inpatient rehab.   -Post-op A-fib--Remainsin SR with 1st degree AVB. Continueoral amiodarone. .  -Expected acute blood loss anemia--plan to transfuse 1 unit PRBC's today.  Continue Trinsicon and monitor.  -Constipation / indigestion-resolved.  -Thrombocytopenia--Plt count  has continued to trend down -> 23,000 today.  HIT antibody screen and SRA ordered yesterday.    Not on heparin or Lovenox. ASA discontinued yesterday.  No signs of bleeding.   LOS: 10 days    Antony Odea, [place] [phone] 03/14/2019

## 2019-03-14 NOTE — Progress Notes (Signed)
[place] for argatroban Indication: R/o HIT  Allergies  Allergen Reactions  . Heparin     HIT antibody pending 3/13    Patient Measurements: Height: 5\' 7"  (170.2 cm) Weight: 235 lb 14.3 oz (107 kg) IBW/kg (Calculated) : 66.1  Vital Signs: Temp: 98 F (36.7 C) (03/13 1015) Temp Source: Oral (03/13 1015) BP: 117/67 (03/13 1400) Pulse Rate: 75 (03/13 1400)  Labs: Recent Labs    03/12/19 0212 03/12/19 0212 03/13/19 0846 03/13/19 0846 03/14/19 0226 03/14/19 1045 03/14/19 1350  HGB 7.7*   < > 8.8*   < > 8.2*  --  9.5*  HCT 25.3*   < > 28.4*  --  26.2*  --  29.4*  PLT 56*   < > 30*  --  23*  --  20*  APTT  --   --   --   --   --  41* 73*  CREATININE 0.87  --  0.90  --  0.85  --   --    < > = values in this interval not displayed.    Estimated Creatinine Clearance: 94.4 mL/min (by C-G formula based on SCr of 0.85 mg/dL).   Medical History: Past Medical History:  Diagnosis Date  . Alcohol abuse   . Aortic valve disease    AI and AS, Moderate  . Chest pain 03/2019  . Drug use   . Edema of both lower extremities   . Glaucoma   . Hypertension   . OSA (obstructive sleep apnea)   . Shingles     Medications:  Infusions:  . sodium chloride    . amiodarone    . argatroban 0.5 mcg/kg/min (03/14/19 1047)    Assessment: 69 yo male admitted with ACS, now s/p CABG with dropping platelet count.  Baseline 227.  Heparin exposure 3/3-3/5, platelets dropped to 146 on 3/6, but then increased to 150 on 3/9.  As of 3/10 have continued to trend down. Platelet count today down to 23 and pharmacy asked to begin argatroban per possible HIT protocol.  Initial aPTT drawn just minutes after infusion started (41), now up to 73 after infusion running for 2 hrs (at goal).  Goal of Therapy:  aPTT 50-90 seconds Monitor platelets by anticoagulation protocol: Yes   Plan:  1. Continue argatroban at 0.5 mcg/kg/min. 2. Check aPTT in 2 hrs. 3. Daily  aPTT and CBC. 4.  F/u HIT labs as available.  Antibody and SRA pending.  Marguerite Olea, [facility] Clinical Pharmacist Phone [phone]  03/14/2019 3:00 PM

## 2019-03-14 NOTE — Plan of Care (Signed)
  Problem: Education: Goal: Knowledge of General Education information will improve Description: Including pain rating scale, medication(s)/side effects and non-pharmacologic comfort measures Outcome: Progressing   Problem: Education: Goal: Knowledge of General Education information will improve Description: Including pain rating scale, medication(s)/side effects and non-pharmacologic comfort measures Outcome: Progressing   Problem: Health Behavior/Discharge Planning: Goal: Ability to manage health-related needs will improve Outcome: Progressing   Problem: Clinical Measurements: Goal: Ability to maintain clinical measurements within normal limits will improve Outcome: Progressing Goal: Will remain free from infection Outcome: Progressing Goal: Diagnostic test results will improve Outcome: Progressing Goal: Respiratory complications will improve Outcome: Progressing Goal: Cardiovascular complication will be avoided Outcome: Progressing   Problem: Activity: Goal: Risk for activity intolerance will decrease Outcome: Progressing   Problem: Nutrition: Goal: Adequate nutrition will be maintained Outcome: Progressing   Problem: Coping: Goal: Level of anxiety will decrease Outcome: Progressing   Problem: Elimination: Goal: Will not experience complications related to bowel motility Outcome: Progressing Goal: Will not experience complications related to urinary retention Outcome: Progressing   Problem: Pain Managment: Goal: General experience of comfort will improve Outcome: Progressing   Problem: Safety: Goal: Ability to remain free from injury will improve Outcome: Progressing   Problem: Skin Integrity: Goal: Risk for impaired skin integrity will decrease Outcome: Progressing   Problem: Education: Goal: Understanding of cardiac disease, CV risk reduction, and recovery process will improve Outcome: Progressing Goal: Understanding of medication regimen will  improve Outcome: Progressing Goal: Individualized Educational Video(s) Outcome: Progressing   Problem: Activity: Goal: Ability to tolerate increased activity will improve Outcome: Progressing   Problem: Cardiac: Goal: Ability to achieve and maintain adequate cardiopulmonary perfusion will improve Outcome: Progressing Goal: Vascular access site(s) Level 0-1 will be maintained Outcome: Progressing   Problem: Health Behavior/Discharge Planning: Goal: Ability to safely manage health-related needs after discharge will improve Outcome: Progressing   Problem: Education: Goal: Will demonstrate proper wound care and an understanding of methods to prevent future damage Outcome: Progressing Goal: Knowledge of disease or condition will improve Outcome: Progressing Goal: Knowledge of the prescribed therapeutic regimen will improve Outcome: Progressing Goal: Individualized Educational Video(s) Outcome: Progressing   Problem: Activity: Goal: Risk for activity intolerance will decrease Outcome: Progressing   Problem: Cardiac: Goal: Will achieve and/or maintain hemodynamic stability Outcome: Progressing   Problem: Clinical Measurements: Goal: Postoperative complications will be avoided or minimized Outcome: Progressing   Problem: Respiratory: Goal: Respiratory status will improve Outcome: Progressing   Problem: Skin Integrity: Goal: Wound healing without signs and symptoms of infection Outcome: Progressing Goal: Risk for impaired skin integrity will decrease Outcome: Progressing   Problem: Urinary Elimination: Goal: Ability to achieve and maintain adequate renal perfusion and functioning will improve Outcome: Progressing   Problem: Activity: Goal: Risk for activity intolerance will decrease Outcome: Progressing   Problem: Cardiac: Goal: Will achieve and/or maintain hemodynamic stability Outcome: Progressing   Problem: Respiratory: Goal: Respiratory status will  improve Outcome: Progressing   Problem: Urinary Elimination: Goal: Ability to achieve and maintain adequate renal perfusion and functioning will improve Outcome: Progressing

## 2019-03-14 NOTE — Progress Notes (Signed)
CARDIAC REHAB PHASE I   PRE:  Rate/Rhythm: 130s-140s Afib  1115 CR staff in to see patient. Patient laying in bed with table top fan blowing for "feeling hot". Primary RN in with patient. New onset Afib with RVR. Patient not feeling well. No ambulation at this time secondary to medical condition. Awaiting CVTS PA to present to bedside for Afib management. Will follow up with patient on Monday.  Colsen Modi [name] RN, BSN

## 2019-03-14 NOTE — Progress Notes (Addendum)
Patient converted to A-fib - HR 100-128.  B/P 110/95.  States feels "flushed", T=98.1.,  Otherwise, asymptomatic.  Enid Cutter, PA-C notified.  NNO at this time.  Will be in later to assess.

## 2019-03-15 LAB — CBC
HCT: 29.4 % — ABNORMAL LOW (ref 39.0–52.0)
HCT: 28.4 % — ABNORMAL LOW (ref 39.0–52.0)
Hemoglobin: 9.5 g/dL — ABNORMAL LOW (ref 13.0–17.0)
Hemoglobin: 9.1 g/dL — ABNORMAL LOW (ref 13.0–17.0)
MCH: 29.2 pg (ref 26.0–34.0)
MCH: 29.2 pg (ref 26.0–34.0)
MCHC: 32.3 g/dL (ref 30.0–36.0)
MCHC: 32 g/dL (ref 30.0–36.0)
MCV: 90.5 fL (ref 80.0–100.0)
MCV: 91 fL (ref 80.0–100.0)
Platelets: 20 10*3/uL — CL (ref 150–400)
Platelets: 17 10*3/uL — CL (ref 150–400)
RBC: 3.25 MIL/uL — ABNORMAL LOW (ref 4.22–5.81)
RBC: 3.12 MIL/uL — ABNORMAL LOW (ref 4.22–5.81)
RDW: 17 % — ABNORMAL HIGH (ref 11.5–15.5)
RDW: 17.2 % — ABNORMAL HIGH (ref 11.5–15.5)
WBC: 7.7 10*3/uL (ref 4.0–10.5)
WBC: 7.2 10*3/uL (ref 4.0–10.5)
nRBC: 0 % (ref 0.0–0.2)
nRBC: 0.3 % — ABNORMAL HIGH (ref 0.0–0.2)

## 2019-03-15 LAB — BASIC METABOLIC PANEL
Anion gap: 12 (ref 5–15)
BUN: 21 mg/dL (ref 8–23)
CO2: 32 mmol/L (ref 22–32)
Calcium: 8.2 mg/dL — ABNORMAL LOW (ref 8.9–10.3)
Chloride: 94 mmol/L — ABNORMAL LOW (ref 98–111)
Creatinine, Ser: 0.97 mg/dL (ref 0.61–1.24)
GFR calc Af Amer: >60 mL/min (ref >60)
GFR calc non Af Amer: >60 mL/min (ref >60)
Glucose, Bld: 108 mg/dL — ABNORMAL HIGH (ref 70–99)
Potassium: 4.5 mmol/L (ref 3.5–5.1)
Sodium: 138 mmol/L (ref 135–145)

## 2019-03-15 LAB — BPAM RBC
Blood Product Expiration Date: 202104052359
ISSUE DATE / TIME: 202103130808
Unit Type and Rh: 7300

## 2019-03-15 LAB — GLUCOSE, CAPILLARY
Glucose-Capillary: 100 mg/dL — ABNORMAL HIGH (ref 70–99)
Glucose-Capillary: 103 mg/dL — ABNORMAL HIGH (ref 70–99)
Glucose-Capillary: 90 mg/dL (ref 70–99)
Glucose-Capillary: 104 mg/dL — ABNORMAL HIGH (ref 70–99)

## 2019-03-15 LAB — TYPE AND SCREEN
ABO/RH(D): B POS
Antibody Screen: NEGATIVE
Unit division: 0

## 2019-03-15 LAB — AEROBIC/ANAEROBIC CULTURE W GRAM STAIN (SURGICAL/DEEP WOUND)

## 2019-03-15 LAB — HEPARIN INDUCED PLATELET AB (HIT ANTIBODY): Heparin Induced Plt Ab: 2.476 OD — ABNORMAL HIGH (ref 0.000–0.400)

## 2019-03-15 LAB — APTT: aPTT: 74 seconds — ABNORMAL HIGH (ref 24–36)

## 2019-03-15 NOTE — Progress Notes (Signed)
Occupational Therapy Treatment Patient Details Name: Gregory [name] MRN: [id number] DOB: August 06, 1950 Today's Date: 03/15/2019    History of present illness Pt is a 69 yo male s/p CABG x3, aortic valve replacement, radial artery harvest and TEE. PMHx: HTN, HLD, obstructive sleep apnea, chronic lymphedema in BLEs, obesity, back pain, ETOH use.   OT comments  Pt progressing with sit to stands and OOB ADL. Pt standing for grooming task x6 mins at sink with supervisionA. Pt requiring modA to maxA overall for toilet hygiene and bathing after catheter malfunction. Pt minguardA for power-up for sit to stand, but continues to require modA for bed mobility for trunk elevation. Pt's O2 desats on 3L O2 with exertion after 1 minute into 80s; pt requiring 4L O2 to maintain >90% O2.  Pt continues to require OT skilled services to increase to PLOF. OT following.    Follow Up Recommendations  Home health OT;Supervision/Assistance - 24 hour    Equipment Recommendations  3 in 1 bedside commode    Recommendations for Other Services      Precautions / Restrictions Precautions Precautions: Fall;Other (comment);Sternal Precaution Booklet Issued: No Precaution Comments: watch O2; pt aware of no pushing/pulling sternal precs Restrictions Weight Bearing Restrictions: Yes       Mobility Bed Mobility Overal bed mobility: Needs Assistance Bed Mobility: Rolling;Sidelying to Sit Rolling: Min guard Sidelying to sit: Mod assist       General bed mobility comments: modA for trunk elevation  Transfers Overall transfer level: Needs assistance Equipment used: Rolling walker (2 wheeled) Transfers: Sit to/from Stand Sit to Stand: Min guard         General transfer comment: using momentum + holding heart pillow with minguardA for power-up to x1 from bed x1 from [facility]    Balance Overall balance assessment: Needs assistance   Sitting balance-Leahy Scale: Good Sitting balance - Comments: supervision    Standing balance support: During functional activity;No upper extremity supported Standing balance-Leahy Scale: Fair Standing balance comment: standing at sink with neither UE supported                           ADL either performed or assessed with clinical judgement   ADL Overall ADL's : Needs assistance/impaired     Grooming: Supervision/safety;Standing Grooming Details (indicate cue type and reason): chair behind for energy conservation- pt stood x5 mins for grooming tasks.             Lower Body Dressing: Maximal assistance;Sit to/from stand Lower Body Dressing Details (indicate cue type and reason): to don socks Toilet Transfer: Min guard;Ambulation;BSC;RW   Toileting- Clothing Manipulation and Hygiene: Moderate assistance;Cueing for safety;Sitting/lateral lean;Sit to/from stand Toileting - Clothing Manipulation Details (indicate cue type and reason): Pt's condom catheter became kinked and pt urinating on floor; pt able to assist with washing anterior pericare prior to replacing catheter.     Functional mobility during ADLs: Min guard;Rolling walker;Cueing for safety General ADL Comments: Pt standing for grooming task x6 mins at sink with supervisionA/ Pt requiring modA to maxA overall for toilet hygiene and bathing after catheter malfunction.     Vision   Vision Assessment?: No apparent visual deficits   Perception     Praxis      Cognition Arousal/Alertness: Awake/alert Behavior During Therapy: WFL for tasks assessed/performed Overall Cognitive Status: Within Functional Limits for tasks assessed  Exercises     Shoulder Instructions       General Comments O2 desats on 3L O2 with exertion after 1 minute into 80; pt requiring 4L O2 to maintain >90% O2.    Pertinent Vitals/ Pain       Pain Assessment: Faces Faces Pain Scale: Hurts little more Pain Location: incision Pain Descriptors /  Indicators: Grimacing Pain Intervention(s): Monitored during session;Repositioned  Home Living                                          Prior Functioning/Environment              Frequency  Min 3X/week        Progress Toward Goals  OT Goals(current goals can now be found in the care plan section)  Progress towards OT goals: Progressing toward goals  Acute Rehab OT Goals Patient Stated Goal: To improve ability to take care of self OT Goal Formulation: With patient Time For Goal Achievement: 03/25/19 Potential to Achieve Goals: Good ADL Goals Pt Will Perform Lower Body Dressing: with min guard assist;sit to/from stand;with adaptive equipment Pt Will Transfer to Toilet: with supervision;ambulating Pt Will Perform Toileting - Clothing Manipulation and hygiene: with supervision;sit to/from stand Pt/caregiver will Perform Home Exercise Program: Increased strength;Both right and left upper extremity;With Supervision Additional ADL Goal #1: Pt will be able to state 3 energy conservation techniques.  Plan Discharge plan remains appropriate;Frequency needs to be updated    Co-evaluation                 AM-PAC OT "6 Clicks" Daily Activity     Outcome Measure   Help from another person eating meals?: None Help from another person taking care of personal grooming?: A Little Help from another person toileting, which includes using toliet, bedpan, or urinal?: A Little Help from another person bathing (including washing, rinsing, drying)?: A Lot Help from another person to put on and taking off regular upper body clothing?: A Lot Help from another person to put on and taking off regular lower body clothing?: A Lot 6 Click Score: 16    End of Session Equipment Utilized During Treatment: Rolling walker  OT Visit Diagnosis: Unsteadiness on feet (R26.81);Muscle weakness (generalized) (M62.81)   Activity Tolerance Patient tolerated treatment well    Patient Left in chair;with call bell/phone within reach;with chair alarm set   Nurse Communication Mobility status        Time: 0900-1000 OT Time Calculation (min): 60 min  Charges: OT General Charges $OT Visit: 1 Visit OT Treatments $Self Care/Home Management : 38-52 mins $Therapeutic Activity: 8-22 mins  Jefferey Pica, OTR/L Acute Rehabilitation Services Pager: [phone] Office: [phone]   Ariyan Brisendine C 03/15/2019, 3:32 PM

## 2019-03-15 NOTE — Progress Notes (Signed)
[place] for argatroban Indication: R/o HIT  Allergies  Allergen Reactions  . Heparin     HIT antibody pending 3/13    Patient Measurements: Height: 5\' 7"  (170.2 cm) Weight: 230 lb 6.1 oz (104.5 kg) IBW/kg (Calculated) : 66.1  Vital Signs: Temp: 98.1 F (36.7 C) (03/14 0355) Temp Source: Oral (03/14 0355) BP: 122/85 (03/14 0355) Pulse Rate: 77 (03/14 0400)  Labs: Recent Labs    03/13/19 0846 03/13/19 0846 03/14/19 0226 03/14/19 0226 03/14/19 1045 03/14/19 1350 03/15/19 0316  HGB 8.8*   < > 8.2*   < >  --  9.5* 9.1*  HCT 28.4*   < > 26.2*  --   --  29.4* 28.4*  PLT 30*   < > 23*  --   --  20* 17*  APTT  --   --   --   --  41* 73* 74*  CREATININE 0.90  --  0.85  --   --   --  0.97   < > = values in this interval not displayed.    Estimated Creatinine Clearance: 81.7 mL/min (by C-G formula based on SCr of 0.97 mg/dL).   Medical History: Past Medical History:  Diagnosis Date  . Alcohol abuse   . Aortic valve disease    AI and AS, Moderate  . Chest pain 03/2019  . Drug use   . Edema of both lower extremities   . Glaucoma   . Hypertension   . OSA (obstructive sleep apnea)   . Shingles     Medications:  Infusions:  . sodium chloride    . amiodarone    . argatroban 0.5 mcg/kg/min (03/15/19 0400)    Assessment: 69 yo male admitted with ACS, now s/p CABG with dropping platelet count.  Baseline 227.  Heparin exposure 3/3-3/5, platelets dropped to 146 on 3/6, but then increased to 150 on 3/9.  As of 3/10 have continued to trend down. Platelet count today down to 23 and pharmacy asked to begin argatroban per possible HIT protocol.  APTT this morning remains in goal range.  Pt is a difficult stick for labs.  HIT antibody test from 3/12 is positive.  Confirmatory SRA pending.  Goal of Therapy:  aPTT 50-90 seconds Monitor platelets by anticoagulation protocol: Yes   Plan:  1. Continue argatroban at 0.5 mcg/kg/min. 2.  Daily aPTT and CBC. 3. F/u SRA for HIT when available.  Marguerite Olea, [facility] Clinical Pharmacist Phone [phone]  03/15/2019 8:18 AM

## 2019-03-15 NOTE — Plan of Care (Signed)
  Problem: Education: Goal: Knowledge of General Education information will improve Description: Including pain rating scale, medication(s)/side effects and non-pharmacologic comfort measures Outcome: Progressing   Problem: Education: Goal: Knowledge of General Education information will improve Description: Including pain rating scale, medication(s)/side effects and non-pharmacologic comfort measures Outcome: Progressing   Problem: Health Behavior/Discharge Planning: Goal: Ability to manage health-related needs will improve Outcome: Progressing   Problem: Clinical Measurements: Goal: Ability to maintain clinical measurements within normal limits will improve Outcome: Progressing Goal: Will remain free from infection Outcome: Progressing Goal: Diagnostic test results will improve Outcome: Progressing Goal: Respiratory complications will improve Outcome: Progressing Goal: Cardiovascular complication will be avoided Outcome: Progressing   Problem: Activity: Goal: Risk for activity intolerance will decrease Outcome: Progressing   Problem: Nutrition: Goal: Adequate nutrition will be maintained Outcome: Progressing   Problem: Coping: Goal: Level of anxiety will decrease Outcome: Progressing   Problem: Elimination: Goal: Will not experience complications related to bowel motility Outcome: Progressing Goal: Will not experience complications related to urinary retention Outcome: Progressing   Problem: Pain Managment: Goal: General experience of comfort will improve Outcome: Progressing   Problem: Safety: Goal: Ability to remain free from injury will improve Outcome: Progressing   Problem: Skin Integrity: Goal: Risk for impaired skin integrity will decrease Outcome: Progressing   Problem: Education: Goal: Understanding of cardiac disease, CV risk reduction, and recovery process will improve Outcome: Progressing Goal: Understanding of medication regimen will  improve Outcome: Progressing Goal: Individualized Educational Video(s) Outcome: Progressing   Problem: Activity: Goal: Ability to tolerate increased activity will improve Outcome: Progressing   Problem: Cardiac: Goal: Ability to achieve and maintain adequate cardiopulmonary perfusion will improve Outcome: Progressing Goal: Vascular access site(s) Level 0-1 will be maintained Outcome: Progressing   Problem: Health Behavior/Discharge Planning: Goal: Ability to safely manage health-related needs after discharge will improve Outcome: Progressing   Problem: Education: Goal: Will demonstrate proper wound care and an understanding of methods to prevent future damage Outcome: Progressing Goal: Knowledge of disease or condition will improve Outcome: Progressing Goal: Knowledge of the prescribed therapeutic regimen will improve Outcome: Progressing Goal: Individualized Educational Video(s) Outcome: Progressing   Problem: Activity: Goal: Risk for activity intolerance will decrease Outcome: Progressing   Problem: Cardiac: Goal: Will achieve and/or maintain hemodynamic stability Outcome: Progressing   Problem: Clinical Measurements: Goal: Postoperative complications will be avoided or minimized Outcome: Progressing   Problem: Respiratory: Goal: Respiratory status will improve Outcome: Progressing   Problem: Skin Integrity: Goal: Wound healing without signs and symptoms of infection Outcome: Progressing Goal: Risk for impaired skin integrity will decrease Outcome: Progressing   Problem: Urinary Elimination: Goal: Ability to achieve and maintain adequate renal perfusion and functioning will improve Outcome: Progressing   Problem: Activity: Goal: Risk for activity intolerance will decrease Outcome: Progressing   Problem: Cardiac: Goal: Will achieve and/or maintain hemodynamic stability Outcome: Progressing   Problem: Respiratory: Goal: Respiratory status will  improve Outcome: Progressing   Problem: Urinary Elimination: Goal: Ability to achieve and maintain adequate renal perfusion and functioning will improve Outcome: Progressing

## 2019-03-15 NOTE — Progress Notes (Signed)
9 Days Post-Op Procedure(s) (LRB): CORONARY ARTERY BYPASS GRAFTING (CABG), ON PUMP, TIMES THREE, USING BILATERAL INTERNAL MAMMARIES AND LEFT RADIAL ARTERY HARVEST (N/A) AORTIC VALVE REPLACEMENT (AVR), USING INTUITY 25MM (N/A) RADIAL ARTERY HARVEST (Left) TRANSESOPHAGEAL ECHOCARDIOGRAM (TEE) (N/A) INDOCYANINE GREEN FLUORESCENCE IMAGING (ICG) (N/A) Subjective: Says he feels a lot better today. Headache resolved.  Had another episode of atrial fibrillation yesterday that converted back to SR with additional  IV amiodarone.  Objective: Vital signs in last 24 hours: Temp:  [97.7 F (36.5 C)-98.1 F (36.7 C)] 98.1 F (36.7 C) (03/14 0355) Pulse Rate:  [70-135] 77 (03/14 0400) Cardiac Rhythm: Normal sinus rhythm;Heart block;Bundle branch block (03/14 0703) Resp:  [12-28] 19 (03/14 0400) BP: (94-138)/(61-116) 122/85 (03/14 0355) SpO2:  [93 %-99 %] 97 % (03/14 0400) Weight:  [104.5 kg] 104.5 kg (03/14 0714)     Intake/Output from previous day: 03/13 0701 - 03/14 0700 In: 804 [P.O.:448; I.V.:62; Blood:294] Out: 1600 [Urine:1600] Intake/Output this shift: No intake/output data recorded.   Physical Exam: General appearance:Sleeping but awakened easily. Hs is alert, cooperative and milddistress. Neurologic:intact Heart:SR with first degreee AVB Lungs:Breath sounds clear. O2 sats96on 3Lnc O2. Abdomen:Soft, non-tender, active bowel sounds. Extremities:All are warm and well perfused. Left arm incision is intact and dry. Wound:the sternal incision is well approximated.  Lab Results: Recent Labs    03/14/19 1350 03/15/19 0316  WBC 7.7 7.2  HGB 9.5* 9.1*  HCT 29.4* 28.4*  PLT 20* 17*   BMET:  Recent Labs    03/14/19 0226 03/15/19 0316  NA 138 138  K 4.5 4.5  CL 93* 94*  CO2 32 32  GLUCOSE 101* 108*  BUN 17 21  CREATININE 0.85 0.97  CALCIUM 8.3* 8.2*    PT/INR: No results for input(s): LABPROT, INR in the last 72 hours. ABG    Component Value Date/Time   PHART 7.329 (L) 03/07/2019 0639   HCO3 22.3 03/07/2019 0639   TCO2 30 03/08/2019 2319   ACIDBASEDEF 3.0 (H) 03/07/2019 0639   O2SAT 91.0 03/07/2019 0639   CBG (last 3)  Recent Labs    03/14/19 1618 03/14/19 2118 03/15/19 0635  GLUCAP 111* 107* 100*    Assessment/Plan: S/P Procedure(s) (LRB): CORONARY ARTERY BYPASS GRAFTING (CABG), ON PUMP, TIMES THREE, USING BILATERAL INTERNAL MAMMARIES AND LEFT RADIAL ARTERY HARVEST (N/A) AORTIC VALVE REPLACEMENT (AVR), USING INTUITY 25MM (N/A) RADIAL ARTERY HARVEST (Left) TRANSESOPHAGEAL ECHOCARDIOGRAM (TEE) (N/A) INDOCYANINE GREEN FLUORESCENCE IMAGING (ICG) (N/A)  -POD-9CABG / AVR. BPstable off midodrine.  Making slow progress with mobility.    -Post-op A-fib--Remainsin SR with 1st degree AVB. Continueoral amiodarone. .  -Expected acute blood loss anemia--Hct improved after transfusion 1 unit PRBC's 3/13. Continue Trinsicon and monitor.  -Constipation / indigestion-resolved.  -Thrombocytopenia--Plt count has continued to trend down -> 17,000 today.  HIT antibody screen positive at 3.1 ( normal 0.0-0.4)  SRA ordered 3/12 ans is pending.  Not on heparin or Lovenox. ASA discontinued. Argatroban started 03/14/19.  No signs of bleeding.  -Disposition-He was evaluated by PT / OT and is felt to be functioning too well to justify inpatient rehab. Will plan for eventual discharge with home PT.    LOS: 11 days    Antony Odea, PA-C [phone] 03/15/2019

## 2019-03-15 NOTE — Plan of Care (Signed)
  Problem: Education: Goal: Knowledge of General Education information will improve Description: Including pain rating scale, medication(s)/side effects and non-pharmacologic comfort measures Outcome: Progressing   Problem: Education: Goal: Knowledge of General Education information will improve Description: Including pain rating scale, medication(s)/side effects and non-pharmacologic comfort measures Outcome: Progressing   Problem: Health Behavior/Discharge Planning: Goal: Ability to manage health-related needs will improve Outcome: Progressing   Problem: Clinical Measurements: Goal: Ability to maintain clinical measurements within normal limits will improve Outcome: Progressing Goal: Will remain free from infection Outcome: Progressing Goal: Diagnostic test results will improve Outcome: Progressing Goal: Respiratory complications will improve Outcome: Progressing Goal: Cardiovascular complication will be avoided Outcome: Progressing   Problem: Activity: Goal: Risk for activity intolerance will decrease Outcome: Progressing   Problem: Nutrition: Goal: Adequate nutrition will be maintained Outcome: Progressing   Problem: Coping: Goal: Level of anxiety will decrease Outcome: Progressing   Problem: Elimination: Goal: Will not experience complications related to bowel motility Outcome: Progressing Goal: Will not experience complications related to urinary retention Outcome: Progressing   Problem: Pain Managment: Goal: General experience of comfort will improve Outcome: Progressing   Problem: Safety: Goal: Ability to remain free from injury will improve Outcome: Progressing   Problem: Skin Integrity: Goal: Risk for impaired skin integrity will decrease Outcome: Progressing   Problem: Education: Goal: Understanding of cardiac disease, CV risk reduction, and recovery process will improve Outcome: Progressing Goal: Understanding of medication regimen will  improve Outcome: Progressing Goal: Individualized Educational Video(s) Outcome: Progressing   Problem: Activity: Goal: Ability to tolerate increased activity will improve Outcome: Progressing   Problem: Cardiac: Goal: Ability to achieve and maintain adequate cardiopulmonary perfusion will improve Outcome: Progressing Goal: Vascular access site(s) Level 0-1 will be maintained Outcome: Progressing   Problem: Health Behavior/Discharge Planning: Goal: Ability to safely manage health-related needs after discharge will improve Outcome: Progressing   Problem: Education: Goal: Will demonstrate proper wound care and an understanding of methods to prevent future damage Outcome: Progressing Goal: Knowledge of disease or condition will improve Outcome: Progressing Goal: Knowledge of the prescribed therapeutic regimen will improve Outcome: Progressing Goal: Individualized Educational Video(s) Outcome: Progressing   Problem: Activity: Goal: Risk for activity intolerance will decrease Outcome: Progressing   Problem: Cardiac: Goal: Will achieve and/or maintain hemodynamic stability Outcome: Progressing   Problem: Clinical Measurements: Goal: Postoperative complications will be avoided or minimized Outcome: Progressing   Problem: Respiratory: Goal: Respiratory status will improve Outcome: Progressing   Problem: Urinary Elimination: Goal: Ability to achieve and maintain adequate renal perfusion and functioning will improve Outcome: Progressing   Problem: Activity: Goal: Risk for activity intolerance will decrease Outcome: Progressing   Problem: Cardiac: Goal: Will achieve and/or maintain hemodynamic stability Outcome: Progressing   Problem: Respiratory: Goal: Respiratory status will improve Outcome: Progressing   Problem: Urinary Elimination: Goal: Ability to achieve and maintain adequate renal perfusion and functioning will improve Outcome: Progressing

## 2019-03-15 NOTE — Telephone Encounter (Signed)
I looked over the hospital records and it looks like the patient developed what is essentially a reaction to the heparin that means he cannot ever use this med again.  He is being appropriately treated as I reviewed the plan and labs.

## 2019-03-16 ENCOUNTER — Inpatient Hospital Stay (HOSPITAL_COMMUNITY): Payer: Medicare HMO

## 2019-03-16 LAB — BASIC METABOLIC PANEL
Anion gap: 11 (ref 5–15)
BUN: 25 mg/dL — ABNORMAL HIGH (ref 8–23)
CO2: 34 mmol/L — ABNORMAL HIGH (ref 22–32)
Calcium: 8.2 mg/dL — ABNORMAL LOW (ref 8.9–10.3)
Chloride: 94 mmol/L — ABNORMAL LOW (ref 98–111)
Creatinine, Ser: 1.03 mg/dL (ref 0.61–1.24)
GFR calc Af Amer: >60 mL/min (ref >60)
GFR calc non Af Amer: >60 mL/min (ref >60)
Glucose, Bld: 96 mg/dL (ref 70–99)
Potassium: 4.1 mmol/L (ref 3.5–5.1)
Sodium: 139 mmol/L (ref 135–145)

## 2019-03-16 LAB — CBC
HCT: 29.2 % — ABNORMAL LOW (ref 39.0–52.0)
Hemoglobin: 9.2 g/dL — ABNORMAL LOW (ref 13.0–17.0)
MCH: 28.8 pg (ref 26.0–34.0)
MCHC: 31.5 g/dL (ref 30.0–36.0)
MCV: 91.5 fL (ref 80.0–100.0)
Platelets: 18 10*3/uL — CL (ref 150–400)
RBC: 3.19 MIL/uL — ABNORMAL LOW (ref 4.22–5.81)
RDW: 17 % — ABNORMAL HIGH (ref 11.5–15.5)
WBC: 7.3 10*3/uL (ref 4.0–10.5)
nRBC: 0 % (ref 0.0–0.2)

## 2019-03-16 LAB — GLUCOSE, CAPILLARY
Glucose-Capillary: 83 mg/dL (ref 70–99)
Glucose-Capillary: 96 mg/dL (ref 70–99)
Glucose-Capillary: 84 mg/dL (ref 70–99)
Glucose-Capillary: 92 mg/dL (ref 70–99)

## 2019-03-16 LAB — APTT: aPTT: 83 seconds — ABNORMAL HIGH (ref 24–36)

## 2019-03-16 NOTE — Progress Notes (Signed)
[place] for argatroban Indication: R/o HIT  Allergies  Allergen Reactions  . Heparin     HIT antibody pending 3/13    Patient Measurements: Height: 5\' 7"  (170.2 cm) Weight: 226 lb 13.7 oz (102.9 kg) IBW/kg (Calculated) : 66.1  Vital Signs: Temp: 98.2 F (36.8 C) (03/15 0400) Temp Source: Oral (03/15 0400) BP: 101/71 (03/15 0400) Pulse Rate: 67 (03/15 0400)  Labs: Recent Labs    03/14/19 0226 03/14/19 1045 03/14/19 1350 03/14/19 1350 03/15/19 0316 03/16/19 0222  HGB 8.2*  --  9.5*   < > 9.1* 9.2*  HCT 26.2*  --  29.4*  --  28.4* 29.2*  PLT 23*  --  20*  --  17* 18*  APTT  --    < > 73*  --  74* 83*  CREATININE 0.85  --   --   --  0.97 1.03   < > = values in this interval not displayed.    Estimated Creatinine Clearance: 76.3 mL/min (by C-G formula based on SCr of 1.03 mg/dL).   Medical History: Past Medical History:  Diagnosis Date  . Alcohol abuse   . Aortic valve disease    AI and AS, Moderate  . Chest pain 03/2019  . Drug use   . Edema of both lower extremities   . Glaucoma   . Hypertension   . OSA (obstructive sleep apnea)   . Shingles     Medications:  Infusions:  . sodium chloride    . amiodarone    . argatroban 0.5 mcg/kg/min (03/16/19 0400)    Assessment: 69 yo male admitted with ACS, now s/p CABG with dropping platelet count.  Baseline 227.  Heparin exposure 3/3-3/5, platelets dropped to 146 on 3/6, but then increased to 150 on 3/9.  As of 3/10 have continued to trend down. Platelet count today stable from yesterday at 18 and pharmacy asked to begin argatroban per possible HIT protocol.  APTT this morning remains in goal range at 83.  Pt is a difficult stick for labs. No s/sx of bleeding.   HIT antibody test from 3/12 is positive.  Confirmatory SRA remains pending.  Goal of Therapy:  aPTT 50-90 seconds Monitor platelets by anticoagulation protocol: Yes   Plan:  1. Continue argatroban at 0.5  mcg/kg/min. 2. Daily aPTT and CBC. 3. F/u SRA for HIT when available.  Antonietta Jewel, PharmD, BCCCP Clinical Pharmacist  Phone: [phone]  Please check AMION for all [place] phone numbers After 10:00 PM, call [place] [phone]  03/16/2019 7:15 AM

## 2019-03-16 NOTE — Progress Notes (Signed)
CARDIAC REHAB PHASE I   PRE:  Rate/Rhythm: 81 SR    BP: sitting 110/85    SaO2: 98 3L  MODE:  Ambulation: 400 ft   POST:  Rate/Rhythm: 98 SR    BP: lying 83/75, retake 96/63     SaO2: 98 3L  Pt feeling much improved. Needed assist to stand. Independent with rollator but slow pace. Rest x3 due to SOB, used 3L. SAO2 improved to 98 3L. Will begin to wean tomorrow. To bed for nap, BP down. Sts he was slightly lightheaded. Practiced IS and flutter. Encouraged x1 more walk this evening.  [place], ACSM 03/16/2019 2:31 PM

## 2019-03-16 NOTE — Progress Notes (Signed)
10 Days Post-Op Procedure(s) (LRB): CORONARY ARTERY BYPASS GRAFTING (CABG), ON PUMP, TIMES THREE, USING BILATERAL INTERNAL MAMMARIES AND LEFT RADIAL ARTERY HARVEST (N/A) AORTIC VALVE REPLACEMENT (AVR), USING INTUITY 25MM (N/A) RADIAL ARTERY HARVEST (Left) TRANSESOPHAGEAL ECHOCARDIOGRAM (TEE) (N/A) INDOCYANINE GREEN FLUORESCENCE IMAGING (ICG) (N/A) Subjective: Awake and alert. More cheerful today and says he is feeling better each day. No new concerns.   Objective: Vital signs in last 24 hours: Temp:  [97.6 F (36.4 C)-98.2 F (36.8 C)] 97.8 F (36.6 C) (03/15 0731) Pulse Rate:  [64-78] 67 (03/15 0400) Cardiac Rhythm: Normal sinus rhythm (03/15 0500) Resp:  [12-20] 12 (03/15 0400) BP: (95-113)/(71-78) 101/71 (03/15 0400) SpO2:  [95 %-97 %] 97 % (03/15 0400) Weight:  [102.9 kg] 102.9 kg (03/15 0500)    Intake/Output from previous day: 03/14 0701 - 03/15 0700 In: 285.7 [P.O.:200; I.V.:85.7] Out: 1850 [Urine:1850] Intake/Output this shift: No intake/output data recorded.  Physical Exam: General appearance:Sleeping but awakened easily. Hs isalert, cooperative andmilddistress. Neurologic:intact Heart:SR with first degreee AVB Lungs:Breath sounds clear. O2 sats96on4Lnc O2. Abdomen:Soft, non-tender, active bowel sounds. Extremities:All are warm and well perfused. Left arm incision is intact and dry. Wound:the sternal incision is well approximated.  Lab Results: Recent Labs    03/15/19 0316 03/16/19 0222  WBC 7.2 7.3  HGB 9.1* 9.2*  HCT 28.4* 29.2*  PLT 17* 18*   BMET:  Recent Labs    03/15/19 0316 03/16/19 0222  NA 138 139  K 4.5 4.1  CL 94* 94*  CO2 32 34*  GLUCOSE 108* 96  BUN 21 25*  CREATININE 0.97 1.03  CALCIUM 8.2* 8.2*    PT/INR: No results for input(s): LABPROT, INR in the last 72 hours. ABG    Component Value Date/Time   PHART 7.329 (L) 03/07/2019 0639   HCO3 22.3 03/07/2019 0639   TCO2 30 03/08/2019 2319   ACIDBASEDEF 3.0 (H)  03/07/2019 0639   O2SAT 91.0 03/07/2019 0639   CBG (last 3)  Recent Labs    03/15/19 1618 03/15/19 2049 03/16/19 0617  GLUCAP 90 104* 83    Assessment/Plan: S/P Procedure(s) (LRB): CORONARY ARTERY BYPASS GRAFTING (CABG), ON PUMP, TIMES THREE, USING BILATERAL INTERNAL MAMMARIES AND LEFT RADIAL ARTERY HARVEST (N/A) AORTIC VALVE REPLACEMENT (AVR), USING INTUITY 25MM (N/A) RADIAL ARTERY HARVEST (Left) TRANSESOPHAGEAL ECHOCARDIOGRAM (TEE) (N/A) INDOCYANINE GREEN FLUORESCENCE IMAGING (ICG) (N/A)  -POD-10CABG / AVR. BPstable off midodrine. Continue working on mobility.    Still on 4L O2., will repeat the CXR today to f/u on small bilateral effusions.   -Post-op A-fib--Remainsin SR with 1st degree AVB. Continueoral amiodarone. .  -Expected acute blood loss anemia--Hct improved after transfusion 1 unit PRBC's 3/13 and continues to trend up. Continue Trinsicon and monitor.  -Constipation / indigestion-resolved.  -Thrombocytopenia--Plt count now trending up slightly ->18,000 today.  HIT antibody screen positive at 3.1 ( normal 0.0-0.4)  SRA ordered 3/12 ans is pending.Not on heparin or Lovenox.ASA discontinued.Argatroban started 03/14/19. No signs of bleeding.  -Disposition-He was evaluated by PT / OT and is felt to be functioning too well to justify inpatient rehab. Will plan for eventual discharge with home PT.    LOS: 12 days    Antony Odea, PA-C [phone] 03/16/2019

## 2019-03-16 NOTE — Plan of Care (Signed)

## 2019-03-16 NOTE — TOC Progression Note (Signed)
Transition of Care [facility]) - Progression Note    Patient Details  Name: Gregory [name] MRN: [id number] Date of Birth: Aug 10, 1950  [facility] of [facility] CM/SW Contact  Zenon Mayo, RN Phone Number: 03/16/2019, 12:07 PM  Clinical Narrative:    Patient from home with wife, pt eval rec HHPT.  Patient states wife will be home with him at all times and she can assist him.  NCM offered choice from medicare.gov list ,he states [facility] will be ok .  NCM made referral to [place] with [facility] for HHPT, she states they are able to take referral, soc will begin 24 to 48 hrs post dc.    Expected Discharge Plan: [place] Barriers to Discharge: Continued Medical Work up  Expected Discharge Plan and Services Expected Discharge Plan: [place] In-house Referral: NA Discharge Planning Services: CM Consult Post Acute Care Choice: Durable Medical Equipment, Home Health Living arrangements for the past 2 months: Single Family Home                 DME Arranged: Walker rolling with seat DME Agency: [employer] Date DME Agency Contacted: 03/16/19 Time DME Agency Contacted: 1206 Representative spoke with at DME Agency: [place]: PT Reliance: [facility] Date [place]: 03/16/19 Time [place]: 1207 Representative spoke with at [place]: [place] ([place]) Interventions    Readmission Risk Interventions No flowsheet data found.

## 2019-03-16 NOTE — Plan of Care (Signed)
  Problem: Education: Goal: Knowledge of General Education information will improve Description: Including pain rating scale, medication(s)/side effects and non-pharmacologic comfort measures Outcome: Progressing   Problem: Education: Goal: Knowledge of General Education information will improve Description: Including pain rating scale, medication(s)/side effects and non-pharmacologic comfort measures Outcome: Progressing   Problem: Health Behavior/Discharge Planning: Goal: Ability to manage health-related needs will improve Outcome: Progressing   Problem: Clinical Measurements: Goal: Ability to maintain clinical measurements within normal limits will improve Outcome: Progressing Goal: Will remain free from infection Outcome: Progressing Goal: Diagnostic test results will improve Outcome: Progressing Goal: Respiratory complications will improve Outcome: Progressing Goal: Cardiovascular complication will be avoided Outcome: Progressing   Problem: Activity: Goal: Risk for activity intolerance will decrease Outcome: Progressing   Problem: Nutrition: Goal: Adequate nutrition will be maintained Outcome: Progressing   Problem: Coping: Goal: Level of anxiety will decrease Outcome: Progressing   Problem: Elimination: Goal: Will not experience complications related to bowel motility Outcome: Progressing Goal: Will not experience complications related to urinary retention Outcome: Progressing   Problem: Pain Managment: Goal: General experience of comfort will improve Outcome: Progressing   Problem: Safety: Goal: Ability to remain free from injury will improve Outcome: Progressing   Problem: Skin Integrity: Goal: Risk for impaired skin integrity will decrease Outcome: Progressing   Problem: Education: Goal: Understanding of cardiac disease, CV risk reduction, and recovery process will improve Outcome: Progressing Goal: Understanding of medication regimen will  improve Outcome: Progressing Goal: Individualized Educational Video(s) Outcome: Progressing   Problem: Activity: Goal: Ability to tolerate increased activity will improve Outcome: Progressing   Problem: Cardiac: Goal: Ability to achieve and maintain adequate cardiopulmonary perfusion will improve Outcome: Progressing Goal: Vascular access site(s) Level 0-1 will be maintained Outcome: Progressing   Problem: Health Behavior/Discharge Planning: Goal: Ability to safely manage health-related needs after discharge will improve Outcome: Progressing   Problem: Education: Goal: Will demonstrate proper wound care and an understanding of methods to prevent future damage Outcome: Progressing Goal: Knowledge of disease or condition will improve Outcome: Progressing Goal: Knowledge of the prescribed therapeutic regimen will improve Outcome: Progressing Goal: Individualized Educational Video(s) Outcome: Progressing   Problem: Activity: Goal: Risk for activity intolerance will decrease Outcome: Progressing   Problem: Cardiac: Goal: Will achieve and/or maintain hemodynamic stability Outcome: Progressing   Problem: Clinical Measurements: Goal: Postoperative complications will be avoided or minimized Outcome: Progressing   Problem: Respiratory: Goal: Respiratory status will improve Outcome: Progressing   Problem: Skin Integrity: Goal: Wound healing without signs and symptoms of infection Outcome: Progressing Goal: Risk for impaired skin integrity will decrease Outcome: Progressing   Problem: Urinary Elimination: Goal: Ability to achieve and maintain adequate renal perfusion and functioning will improve Outcome: Progressing   Problem: Activity: Goal: Risk for activity intolerance will decrease Outcome: Progressing   Problem: Cardiac: Goal: Will achieve and/or maintain hemodynamic stability Outcome: Progressing   Problem: Respiratory: Goal: Respiratory status will  improve Outcome: Progressing   Problem: Urinary Elimination: Goal: Ability to achieve and maintain adequate renal perfusion and functioning will improve Outcome: Progressing

## 2019-03-16 NOTE — Care Management Important Message (Signed)
Important Message  Patient Details  Name: KEYSEAN GRANIER MRN: [id number] Date of Birth: [birth year]   Medicare Important Message Given:  Yes     [name] 03/16/2019, 11:37 AM

## 2019-03-16 NOTE — Progress Notes (Signed)
Physical Therapy Treatment Patient Details Name: Gregory [name] MRN: [id number] DOB: Jul 19, 1950 Today's Date: 03/16/2019    History of Present Illness Pt is a 69 yo male s/p CABG x3, aortic valve replacement, radial artery harvest and TEE. PMHx: HTN, HLD, obstructive sleep apnea, chronic lymphedema in BLEs, obesity, back pain, ETOH use.    PT Comments    Pt making good progress. Did very well using rollator. Prefers rollator to RW for home.    Follow Up Recommendations  Home health PT;Supervision for mobility/OOB     Equipment Recommendations  Other (comment)(rollator)    Recommendations for Other Services       Precautions / Restrictions Precautions Precautions: Fall;Other (comment);Sternal Precaution Booklet Issued: No Precaution Comments: watch O2; pt aware of no pushing/pulling sternal precs    Mobility  Bed Mobility Overal bed mobility: Needs Assistance Bed Mobility: Rolling;Sidelying to Sit Rolling: Min guard Sidelying to sit: Min assist       General bed mobility comments: Assist to elevate trunk into sitting  Transfers Overall transfer level: Needs assistance Equipment used: Rolling walker (2 wheeled) Transfers: Sit to/from Stand Sit to Stand: Min guard         General transfer comment: Assist to bring hips up using rocking momentum. Hands on knees for support.  Ambulation/Gait Ambulation/Gait assistance: Supervision Gait Distance (Feet): 350 Feet Assistive device: 4-wheeled walker Gait Pattern/deviations: Step-through pattern Gait velocity: decr Gait velocity interpretation: 1.31 - 2.62 ft/sec, indicative of limited community ambulator General Gait Details: Amb on 2L with SpO2 >90%. Did well with rolling walker.    Stairs             Wheelchair Mobility    Modified Rankin (Stroke Patients Only)       Balance Overall balance assessment: Needs assistance Sitting-balance support: No upper extremity supported Sitting balance-Leahy  Scale: Good     Standing balance support: During functional activity;No upper extremity supported Standing balance-Leahy Scale: Fair                              Cognition Arousal/Alertness: Awake/alert Behavior During Therapy: WFL for tasks assessed/performed Overall Cognitive Status: Within Functional Limits for tasks assessed                                        Exercises      General Comments        Pertinent Vitals/Pain Pain Assessment: No/denies pain    Home Living                      Prior Function            PT Goals (current goals can now be found in the care plan section) Progress towards PT goals: Progressing toward goals    Frequency    Min 3X/week      PT Plan Current plan remains appropriate    Co-evaluation              AM-PAC PT "6 Clicks" Mobility   Outcome Measure  Help needed turning from your back to your side while in a flat bed without using bedrails?: A Little Help needed moving from lying on your back to sitting on the side of a flat bed without using bedrails?: A Little Help needed moving to and from a bed to a chair (  including a wheelchair)?: A Little Help needed standing up from a chair using your arms (e.g., wheelchair or bedside chair)?: A Little Help needed to walk in hospital room?: A Little Help needed climbing 3-5 steps with a railing? : A Little 6 Click Score: 18    End of Session Equipment Utilized During Treatment: Oxygen Activity Tolerance: Patient tolerated treatment well Patient left: in chair;with call bell/phone within reach;with chair alarm set Nurse Communication: Mobility status PT Visit Diagnosis: Unsteadiness on feet (R26.81)     Time: 1000-1030 PT Time Calculation (min) (ACUTE ONLY): 30 min  Charges:  $Gait Training: 23-37 mins                     [place] Pager [phone] Office [place] 03/16/2019, 2:27 PM

## 2019-03-17 LAB — BASIC METABOLIC PANEL
Anion gap: 13 (ref 5–15)
BUN: 29 mg/dL — ABNORMAL HIGH (ref 8–23)
CO2: 31 mmol/L (ref 22–32)
Calcium: 8.1 mg/dL — ABNORMAL LOW (ref 8.9–10.3)
Chloride: 92 mmol/L — ABNORMAL LOW (ref 98–111)
Creatinine, Ser: 1.12 mg/dL (ref 0.61–1.24)
GFR calc Af Amer: >60 mL/min (ref >60)
GFR calc non Af Amer: >60 mL/min (ref >60)
Glucose, Bld: 106 mg/dL — ABNORMAL HIGH (ref 70–99)
Potassium: 4.3 mmol/L (ref 3.5–5.1)
Sodium: 136 mmol/L (ref 135–145)

## 2019-03-17 LAB — SEROTONIN RELEASE ASSAY (SRA)
SRA .2 IU/mL UFH Ser-aCnc: 92 % — ABNORMAL HIGH (ref 0–20)
SRA 100IU/mL UFH Ser-aCnc: <1 % (ref 0–20)

## 2019-03-17 LAB — CBC
HCT: 28.8 % — ABNORMAL LOW (ref 39.0–52.0)
Hemoglobin: 9.1 g/dL — ABNORMAL LOW (ref 13.0–17.0)
MCH: 28.6 pg (ref 26.0–34.0)
MCHC: 31.6 g/dL (ref 30.0–36.0)
MCV: 90.6 fL (ref 80.0–100.0)
Platelets: 20 10*3/uL — CL (ref 150–400)
RBC: 3.18 MIL/uL — ABNORMAL LOW (ref 4.22–5.81)
RDW: 16.7 % — ABNORMAL HIGH (ref 11.5–15.5)
WBC: 8.4 10*3/uL (ref 4.0–10.5)
nRBC: 0 % (ref 0.0–0.2)

## 2019-03-17 LAB — GLUCOSE, CAPILLARY
Glucose-Capillary: 104 mg/dL — ABNORMAL HIGH (ref 70–99)
Glucose-Capillary: 108 mg/dL — ABNORMAL HIGH (ref 70–99)
Glucose-Capillary: 102 mg/dL — ABNORMAL HIGH (ref 70–99)
Glucose-Capillary: 114 mg/dL — ABNORMAL HIGH (ref 70–99)

## 2019-03-17 LAB — APTT: aPTT: 80 seconds — ABNORMAL HIGH (ref 24–36)

## 2019-03-17 MED ORDER — LOPERAMIDE HCL 1 MG/7.5ML PO SUSP
4.0000 mg | ORAL | Status: AC | PRN
  Filled 2019-03-17: qty 30

## 2019-03-17 MED ORDER — WHITE PETROLATUM EX OINT
TOPICAL_OINTMENT | CUTANEOUS | Status: AC
  Filled 2019-03-17: qty 28.35

## 2019-03-17 NOTE — Progress Notes (Signed)
[place] for argatroban Indication: R/o HIT  Allergies  Allergen Reactions  . Heparin     HIT antibody pending 3/13    Patient Measurements: Height: 5\' 7"  (170.2 cm) Weight: 220 lb 3.8 oz (99.9 kg) IBW/kg (Calculated) : 66.1  Vital Signs: Temp: 97.9 F (36.6 C) (03/16 0415) Temp Source: Oral (03/16 0415) BP: 98/73 (03/16 0415) Pulse Rate: 69 (03/16 0415)  Labs: Recent Labs    03/15/19 0316 03/15/19 0316 03/16/19 0222 03/17/19 0104  HGB 9.1*   < > 9.2* 9.1*  HCT 28.4*  --  29.2* 28.8*  PLT 17*  --  18* 20*  APTT 74*  --  83* 80*  CREATININE 0.97  --  1.03 1.12   < > = values in this interval not displayed.    Estimated Creatinine Clearance: 69.1 mL/min (by C-G formula based on SCr of 1.12 mg/dL).   Medical History: Past Medical History:  Diagnosis Date  . Alcohol abuse   . Aortic valve disease    AI and AS, Moderate  . Chest pain 03/2019  . Drug use   . Edema of both lower extremities   . Glaucoma   . Hypertension   . OSA (obstructive sleep apnea)   . Shingles     Medications:  Infusions:  . sodium chloride    . amiodarone    . argatroban 0.5 mcg/kg/min (03/16/19 1726)    Assessment: 69 yo male admitted with ACS, now s/p CABG with dropping platelet count.  Baseline 227.  Heparin exposure 3/3-3/5, platelets dropped to 146 on 3/6, but then increased to 150 on 3/9.  As of 3/10 have continued to trend down. Pharmacy asked to begin argatroban per possible HIT protocol.  APTT this morning remains in goal range at 80.  Pt is a difficult stick for labs. No s/sx of bleeding. Hgb 9.1, plt increasing slightly to 20  HIT antibody test from 3/12 is positive.  Confirmatory SRA remains pending.  Goal of Therapy:  aPTT 50-90 seconds Monitor platelets by anticoagulation protocol: Yes   Plan:  1. Continue argatroban at 0.5 mcg/kg/min. 2. Daily aPTT and CBC. 3. F/u SRA for HIT when available.  Antonietta Jewel, PharmD,  BCCCP Clinical Pharmacist  Phone: [phone]  Please check AMION for all [place] phone numbers After 10:00 PM, call [place] [phone]  03/17/2019 7:17 AM

## 2019-03-17 NOTE — Telephone Encounter (Signed)
Spoke with patient's wife. Informed her that Dr. Percival Spanish has reviewed the hospital records and labs. Dr. Percival Spanish is in agreement with treatment plan. Spouse reports her concerns have been addressed at this time. She was having difficulty with communication but it has gotten better. No further questions or concerns at this time.

## 2019-03-17 NOTE — Plan of Care (Signed)
  Problem: Education: Goal: Knowledge of General Education information will improve Description: Including pain rating scale, medication(s)/side effects and non-pharmacologic comfort measures Outcome: Progressing   Problem: Education: Goal: Knowledge of General Education information will improve Description: Including pain rating scale, medication(s)/side effects and non-pharmacologic comfort measures Outcome: Progressing   Problem: Health Behavior/Discharge Planning: Goal: Ability to manage health-related needs will improve Outcome: Progressing   Problem: Clinical Measurements: Goal: Ability to maintain clinical measurements within normal limits will improve Outcome: Progressing Goal: Will remain free from infection Outcome: Progressing Goal: Diagnostic test results will improve Outcome: Progressing Goal: Respiratory complications will improve Outcome: Progressing Goal: Cardiovascular complication will be avoided Outcome: Progressing   Problem: Activity: Goal: Risk for activity intolerance will decrease Outcome: Progressing   Problem: Nutrition: Goal: Adequate nutrition will be maintained Outcome: Progressing   Problem: Coping: Goal: Level of anxiety will decrease Outcome: Progressing   Problem: Elimination: Goal: Will not experience complications related to bowel motility Outcome: Progressing Goal: Will not experience complications related to urinary retention Outcome: Progressing   Problem: Pain Managment: Goal: General experience of comfort will improve Outcome: Progressing   Problem: Safety: Goal: Ability to remain free from injury will improve Outcome: Progressing   Problem: Skin Integrity: Goal: Risk for impaired skin integrity will decrease Outcome: Progressing   Problem: Education: Goal: Understanding of cardiac disease, CV risk reduction, and recovery process will improve Outcome: Progressing Goal: Understanding of medication regimen will  improve Outcome: Progressing Goal: Individualized Educational Video(s) Outcome: Progressing   Problem: Activity: Goal: Ability to tolerate increased activity will improve Outcome: Progressing   Problem: Cardiac: Goal: Ability to achieve and maintain adequate cardiopulmonary perfusion will improve Outcome: Progressing Goal: Vascular access site(s) Level 0-1 will be maintained Outcome: Progressing   Problem: Health Behavior/Discharge Planning: Goal: Ability to safely manage health-related needs after discharge will improve Outcome: Progressing   Problem: Education: Goal: Will demonstrate proper wound care and an understanding of methods to prevent future damage Outcome: Progressing Goal: Knowledge of disease or condition will improve Outcome: Progressing Goal: Knowledge of the prescribed therapeutic regimen will improve Outcome: Progressing Goal: Individualized Educational Video(s) Outcome: Progressing   Problem: Activity: Goal: Risk for activity intolerance will decrease Outcome: Progressing   Problem: Cardiac: Goal: Will achieve and/or maintain hemodynamic stability Outcome: Progressing   Problem: Clinical Measurements: Goal: Postoperative complications will be avoided or minimized Outcome: Progressing   Problem: Respiratory: Goal: Respiratory status will improve Outcome: Progressing   Problem: Skin Integrity: Goal: Wound healing without signs and symptoms of infection Outcome: Progressing Goal: Risk for impaired skin integrity will decrease Outcome: Progressing   Problem: Urinary Elimination: Goal: Ability to achieve and maintain adequate renal perfusion and functioning will improve Outcome: Progressing   Problem: Activity: Goal: Risk for activity intolerance will decrease Outcome: Progressing   Problem: Cardiac: Goal: Will achieve and/or maintain hemodynamic stability Outcome: Progressing

## 2019-03-17 NOTE — Progress Notes (Signed)
      [address]       [place] [address]             [phone]      11 Days Post-Op Procedure(s) (LRB): CORONARY ARTERY BYPASS GRAFTING (CABG), ON PUMP, TIMES THREE, USING BILATERAL INTERNAL MAMMARIES AND LEFT RADIAL ARTERY HARVEST (N/A) AORTIC VALVE REPLACEMENT (AVR), USING INTUITY 25MM (N/A) RADIAL ARTERY HARVEST (Left) TRANSESOPHAGEAL ECHOCARDIOGRAM (TEE) (N/A) INDOCYANINE GREEN FLUORESCENCE IMAGING (ICG) (N/A) Subjective: Feels okay this morning. Pain is well controlled. Walked three times yesterday but none today so far.   Objective: Vital signs in last 24 hours: Temp:  [97.6 F (36.4 C)-98.4 F (36.9 C)] 97.8 F (36.6 C) (03/16 0839) Pulse Rate:  [69-81] 69 (03/16 0415) Cardiac Rhythm: Normal sinus rhythm;Bundle branch block (03/16 0703) Resp:  [16-25] 18 (03/16 0415) BP: (98-110)/(69-85) 105/76 (03/16 0839) SpO2:  [95 %-99 %] 95 % (03/16 0415) Weight:  [99.9 kg] 99.9 kg (03/16 0415)     Intake/Output from previous day: 03/15 0701 - 03/16 0700 In: 766.2 [P.O.:680; I.V.:86.2] Out: 1900 [Urine:1900] Intake/Output this shift: Total I/O In: -  Out: 300 [Urine:300]  General appearance: alert, cooperative and no distress Heart: regular rate and rhythm, S1, S2 normal, no murmur, click, rub or gallop Lungs: clear to auscultation bilaterally Abdomen: soft, non-tender; bowel sounds normal; no masses,  no organomegaly Extremities: extremities normal, atraumatic, no cyanosis or edema Wound: clean and dry sternal incision and radial harvest site on the left forearm  Lab Results: Recent Labs    03/16/19 0222 03/17/19 0104  WBC 7.3 8.4  HGB 9.2* 9.1*  HCT 29.2* 28.8*  PLT 18* 20*   BMET:  Recent Labs    03/16/19 0222 03/17/19 0104  NA 139 136  K 4.1 4.3  CL 94* 92*  CO2 34* 31  GLUCOSE 96 106*  BUN 25* 29*  CREATININE 1.03 1.12  CALCIUM 8.2* 8.1*    PT/INR: No results for input(s): LABPROT, INR in the last 72 hours. ABG      Component Value Date/Time   PHART 7.329 (L) 03/07/2019 0639   HCO3 22.3 03/07/2019 0639   TCO2 30 03/08/2019 2319   ACIDBASEDEF 3.0 (H) 03/07/2019 0639   O2SAT 91.0 03/07/2019 0639   CBG (last 3)  Recent Labs    03/16/19 1620 03/16/19 2104 03/17/19 0604  GLUCAP 84 92 104*    Assessment/Plan: S/P Procedure(s) (LRB): CORONARY ARTERY BYPASS GRAFTING (CABG), ON PUMP, TIMES THREE, USING BILATERAL INTERNAL MAMMARIES AND LEFT RADIAL ARTERY HARVEST (N/A) AORTIC VALVE REPLACEMENT (AVR), USING INTUITY 25MM (N/A) RADIAL ARTERY HARVEST (Left) TRANSESOPHAGEAL ECHOCARDIOGRAM (TEE) (N/A) INDOCYANINE GREEN FLUORESCENCE IMAGING (ICG) (N/A)  1. CV-BP stable, NSR in the 70s, Continue PO Amio, lipitor, Imdur (radial artery harvest) and metoprolol.  On low-dose midodrine.  2. Pulm- CXR showed: Mild atelectatic type opacity at the bases. Continue to wean oxygen as tolerated.  3. Renal-creatinine 1.12. electrolytes okay 4. Thrombocytopenia, HIT antibody positive. Argatroban started 3/13. H and H 9.1/28.8. Probably continue for another week per Dr. Orvan Seen   Plan: Expected discharge home with home health services when ready. Continue to wean oxygen as tolerated. Encouraged ambulation 3 x daily. Continues to use incentive spirometer.     LOS: 13 days    Gregory [name] 03/17/2019

## 2019-03-17 NOTE — Progress Notes (Signed)
Occupational Therapy Treatment Patient Details Name: Gregory [name] MRN: [id number] DOB: May 18, 1950 Today's Date: 03/17/2019    History of present illness Pt is a 69 yo male s/p CABG x3, aortic valve replacement, radial artery harvest and TEE. PMHx: HTN, HLD, obstructive sleep apnea, chronic lymphedema in BLEs, obesity, back pain, ETOH use.   OT comments  Pt standing for grooming; pt supervisionA for task. Pt cues for hand placement for transfers. O2 desats on 2L O2 with exertion  into 80s; pt requiring 4L O2 to maintain >90% O2. Pt quickly recovers >90% for 15 secs and returned to 2L O2 maintaining >90%.Pt continues to require maxA for LB ADL. Pt would benefit from continued OT skilled services. OT following acutely.   Follow Up Recommendations  Home health OT;Supervision/Assistance - 24 hour    Equipment Recommendations  3 in 1 bedside commode    Recommendations for Other Services      Precautions / Restrictions Precautions Precautions: Fall;Other (comment);Sternal Precaution Booklet Issued: No Precaution Comments: watch O2; pt aware of no pushing/pulling sternal precs Restrictions Weight Bearing Restrictions: Yes Other Position/Activity Restrictions: sternal precautions       Mobility Bed Mobility Overal bed mobility: Needs Assistance Bed Mobility: Rolling;Sidelying to Sit Rolling: Min guard Sidelying to sit: Min assist       General bed mobility comments: Assist to elevate trunk into sitting  Transfers Overall transfer level: Needs assistance Equipment used: Rolling walker (2 wheeled) Transfers: Sit to/from Stand Sit to Stand: Min guard         General transfer comment: Pt holding pillow requiring rocking momentum    Balance Overall balance assessment: Needs assistance Sitting-balance support: No upper extremity supported Sitting balance-Leahy Scale: Good     Standing balance support: During functional activity;No upper extremity supported Standing  balance-Leahy Scale: Fair                             ADL either performed or assessed with clinical judgement   ADL Overall ADL's : Needs assistance/impaired     Grooming: Supervision/safety;Standing Grooming Details (indicate cue type and reason): chair behind for energy conservation- pt stood x5 mins for grooming tasks.             Lower Body Dressing: Maximal assistance;Sit to/from stand Lower Body Dressing Details (indicate cue type and reason): to don socks             Functional mobility during ADLs: Min guard;Rolling walker;Cueing for safety General ADL Comments: Pt standing for grooming; pt supervisionA for task. Pt cues for hand placement for transfers.     Vision   Vision Assessment?: No apparent visual deficits   Perception     Praxis      Cognition Arousal/Alertness: Awake/alert Behavior During Therapy: WFL for tasks assessed/performed Overall Cognitive Status: Within Functional Limits for tasks assessed                                          Exercises     Shoulder Instructions       General Comments O2 desats on 2L O2 with exertion  into 80s; pt requiring 4L O2 to maintain >90% O2. Pt quickly recovers >90% for 15 secs and returned to 2L O2 maintaining >90%.    Pertinent Vitals/ Pain       Pain Assessment: No/denies pain  Home Living                                          Prior Functioning/Environment              Frequency  Min 3X/week        Progress Toward Goals  OT Goals(current goals can now be found in the care plan section)  Progress towards OT goals: Progressing toward goals  Acute Rehab OT Goals Patient Stated Goal: To improve ability to take care of self OT Goal Formulation: With patient Time For Goal Achievement: 03/25/19 Potential to Achieve Goals: Good ADL Goals Pt Will Perform Lower Body Dressing: with min guard assist;sit to/from stand;with adaptive  equipment Pt Will Transfer to Toilet: with supervision;ambulating Pt Will Perform Toileting - Clothing Manipulation and hygiene: with supervision;sit to/from stand Pt/caregiver will Perform Home Exercise Program: Increased strength;Both right and left upper extremity;With Supervision Additional ADL Goal #1: Pt will be able to state 3 energy conservation techniques.  Plan Discharge plan remains appropriate    Co-evaluation                 AM-PAC OT "6 Clicks" Daily Activity     Outcome Measure   Help from another person eating meals?: None Help from another person taking care of personal grooming?: A Little Help from another person toileting, which includes using toliet, bedpan, or urinal?: A Little Help from another person bathing (including washing, rinsing, drying)?: A Lot Help from another person to put on and taking off regular upper body clothing?: A Little Help from another person to put on and taking off regular lower body clothing?: A Lot 6 Click Score: 17    End of Session Equipment Utilized During Treatment: Rolling walker;Oxygen  OT Visit Diagnosis: Unsteadiness on feet (R26.81);Muscle weakness (generalized) (M62.81)   Activity Tolerance Patient tolerated treatment well   Patient Left in chair;with call bell/phone within reach;with chair alarm set   Nurse Communication Mobility status        Time: [id number] OT Time Calculation (min): 17 min  Charges: OT General Charges $OT Visit: 1 Visit OT Treatments $Self Care/Home Management : 8-22 mins  Jefferey Pica, OTR/L Acute Rehabilitation Services Pager: [phone] Office: [phone]    Churchill Grimsley C 03/17/2019, 5:52 PM

## 2019-03-17 NOTE — Progress Notes (Signed)
CARDIAC REHAB PHASE I   PRE:  Rate/Rhythm: 78 SR    BP: sitting 103/70    SaO2: 99 3L, 95-98 RA  MODE:  Ambulation: 500 ft   POST:  Rate/Rhythm: 98 SR    BP: sitting 116/77     SaO2: 98 RA  Pt feeling well this am. Able to get out of bed with less assist but still needed min to get all the way up. Rocked to stand, stood on second try. Steady in hall with rollator. D/c'd O2 and SaO2 97 RA. Pt sts slightly more SOB but tolerated fairly well. Had him increase distance. To recliner. Encouraged x2 more walks and IS/flutter. Feeling good.  [place], ACSM 03/17/2019 10:39 AM

## 2019-03-17 NOTE — Plan of Care (Signed)
  Problem: Education: Goal: Knowledge of General Education information will improve Description: Including pain rating scale, medication(s)/side effects and non-pharmacologic comfort measures Outcome: Progressing   Problem: Education: Goal: Knowledge of General Education information will improve Description: Including pain rating scale, medication(s)/side effects and non-pharmacologic comfort measures Outcome: Progressing   Problem: Health Behavior/Discharge Planning: Goal: Ability to manage health-related needs will improve Outcome: Progressing   Problem: Clinical Measurements: Goal: Ability to maintain clinical measurements within normal limits will improve Outcome: Progressing Goal: Will remain free from infection Outcome: Progressing Goal: Respiratory complications will improve Outcome: Progressing Goal: Cardiovascular complication will be avoided Outcome: Progressing   Problem: Activity: Goal: Risk for activity intolerance will decrease Outcome: Progressing   Problem: Nutrition: Goal: Adequate nutrition will be maintained Outcome: Progressing   Problem: Coping: Goal: Level of anxiety will decrease Outcome: Progressing

## 2019-03-18 LAB — GLUCOSE, CAPILLARY
Glucose-Capillary: 95 mg/dL (ref 70–99)
Glucose-Capillary: 128 mg/dL — ABNORMAL HIGH (ref 70–99)
Glucose-Capillary: 103 mg/dL — ABNORMAL HIGH (ref 70–99)
Glucose-Capillary: 125 mg/dL — ABNORMAL HIGH (ref 70–99)

## 2019-03-18 LAB — CBC
HCT: 27.4 % — ABNORMAL LOW (ref 39.0–52.0)
Hemoglobin: 8.7 g/dL — ABNORMAL LOW (ref 13.0–17.0)
MCH: 28.7 pg (ref 26.0–34.0)
MCHC: 31.8 g/dL (ref 30.0–36.0)
MCV: 90.4 fL (ref 80.0–100.0)
Platelets: 25 10*3/uL — CL (ref 150–400)
RBC: 3.03 MIL/uL — ABNORMAL LOW (ref 4.22–5.81)
RDW: 17 % — ABNORMAL HIGH (ref 11.5–15.5)
WBC: 9.4 10*3/uL (ref 4.0–10.5)
nRBC: 0 % (ref 0.0–0.2)

## 2019-03-18 LAB — APTT: aPTT: 79 seconds — ABNORMAL HIGH (ref 24–36)

## 2019-03-18 MED ORDER — BOOST / RESOURCE BREEZE PO LIQD CUSTOM
237.0000 mL | Freq: Three times a day (TID) | ORAL | Status: DC
  Administered 2019-03-18 – 2019-03-20 (×6): 1 via ORAL

## 2019-03-18 NOTE — Progress Notes (Signed)
Physical Therapy Treatment Patient Details Name: Gregory [name] MRN: [id number] DOB: [birth year] Today's Date: 03/18/2019    History of Present Illness Pt is a 69 yo male s/p CABG x3, aortic valve replacement, radial artery harvest and TEE. PMHx: HTN, HLD, obstructive sleep apnea, chronic lymphedema in BLEs, obesity, back pain, ETOH use.    PT Comments    Patient seen for gait/stair training. Pt tolerated session well on RA. Pt requires min guard/min A for mobility this session. Pt will continue to benefit from further skilled PT services to maximize independence and safety with mobility.    Follow Up Recommendations  Home health PT;Supervision for mobility/OOB     Equipment Recommendations  Other (comment)(rollator  (deliverd to room))    Recommendations for Other Services       Precautions / Restrictions Precautions Precautions: Fall;Sternal Precaution Booklet Issued: No Restrictions Weight Bearing Restrictions: Yes    Mobility  Bed Mobility Overal bed mobility: Needs Assistance Bed Mobility: Rolling;Sidelying to Sit;Sit to Sidelying Rolling: Min guard Sidelying to sit: Mod assist     Sit to sidelying: Min guard General bed mobility comments: cues for sequencing  Transfers Overall transfer level: Needs assistance Equipment used: (rollator) Transfers: Sit to/from Stand Sit to Stand: Min guard         General transfer comment: stood several times during session; first 2 trials pt with L lateral bias and unable to gain balance so returned to sitting; third trial pt more steady   Ambulation/Gait Ambulation/Gait assistance: Min guard Gait Distance (Feet): 150 Feet Assistive device: 4-wheeled walker Gait Pattern/deviations: Step-through pattern;Decreased stride length Gait velocity: decreased   General Gait Details: VSS on RA; min guard for safety given impaired balance with sit to stands this session; no LOB   Stairs Stairs: Yes Stairs assistance: Min  assist Stair Management: One rail Left;Step to pattern;Sideways Number of Stairs: 2 General stair comments:  cues for sequencing and technique; painful knees but R > L; assist to steady; heavy reliance on rail    Wheelchair Mobility    Modified Rankin (Stroke Patients Only)       Balance Overall balance assessment: Needs assistance Sitting-balance support: No upper extremity supported Sitting balance-Leahy Scale: Good     Standing balance support: During functional activity Standing balance-Leahy Scale: Poor                              Cognition Arousal/Alertness: Awake/alert Behavior During Therapy: WFL for tasks assessed/performed Overall Cognitive Status: Within Functional Limits for tasks assessed                                        Exercises      General Comments        Pertinent Vitals/Pain Pain Assessment: Faces Faces Pain Scale: Hurts little more Pain Location: incision Pain Descriptors / Indicators: Grimacing Pain Intervention(s): Monitored during session;Repositioned    Home Living                      Prior Function            PT Goals (current goals can now be found in the care plan section) Progress towards PT goals: Progressing toward goals    Frequency    Min 3X/week      PT Plan Current plan remains appropriate  Co-evaluation              AM-PAC PT "6 Clicks" Mobility   Outcome Measure  Help needed turning from your back to your side while in a flat bed without using bedrails?: A Little Help needed moving from lying on your back to sitting on the side of a flat bed without using bedrails?: A Little Help needed moving to and from a bed to a chair (including a wheelchair)?: A Little Help needed standing up from a chair using your arms (e.g., wheelchair or bedside chair)?: A Little Help needed to walk in hospital room?: A Little Help needed climbing 3-5 steps with a railing? : A  Little 6 Click Score: 18    End of Session Equipment Utilized During Treatment: Gait belt Activity Tolerance: Patient tolerated treatment well Patient left: with call bell/phone within reach;Other (comment)(pt on [facility] and RN notified) Nurse Communication: Mobility status PT Visit Diagnosis: Unsteadiness on feet (R26.81)     Time: [id number] PT Time Calculation (min) (ACUTE ONLY): 37 min  Charges:  $Gait Training: 23-37 mins                     Earney Navy, PTA Acute Rehabilitation Services Pager: [phone] Office: [phone]     Darliss Cheney 03/18/2019, 5:43 PM

## 2019-03-18 NOTE — Progress Notes (Signed)
CARDIAC REHAB PHASE I   PRE:  Rate/Rhythm: 78 SR    BP: sitting 85/68    SaO2: 95 RA  MODE:  Ambulation: 560 ft   POST:  Rate/Rhythm: 98 SR    BP: sitting 123/87     SaO2: 94 RA, up to 100 RA resting  Pt still requiring assist to move himself to EOB. Difficult for him to turn and also to scoot hips. Stood mostly independent. Ambulated independently with rollator. No rest, increased distance, less SOB. To recliner. SAO2 94 RA. Encouraged IS/flutter. Sts he wore O2 last night. Has OSA.  [place], ACSM 03/18/2019 2:52 PM

## 2019-03-18 NOTE — Progress Notes (Signed)
      [address]       [place] [address]             [phone]      12 Days Post-Op Procedure(s) (LRB): CORONARY ARTERY BYPASS GRAFTING (CABG), ON PUMP, TIMES THREE, USING BILATERAL INTERNAL MAMMARIES AND LEFT RADIAL ARTERY HARVEST (N/A) AORTIC VALVE REPLACEMENT (AVR), USING INTUITY 25MM (N/A) RADIAL ARTERY HARVEST (Left) TRANSESOPHAGEAL ECHOCARDIOGRAM (TEE) (N/A) INDOCYANINE GREEN FLUORESCENCE IMAGING (ICG) (N/A)   Subjective:  No new complaints.  Patient is feeling great.  He is hoping to go home so.  Objective: Vital signs in last 24 hours: Temp:  [97.5 F (36.4 C)-98.2 F (36.8 C)] 97.5 F (36.4 C) (03/17 0500) Pulse Rate:  [68-82] 69 (03/17 0500) Cardiac Rhythm: Normal sinus rhythm;Bundle branch block (03/17 0703) Resp:  [15-24] 15 (03/17 0500) BP: (94-111)/(64-76) 109/65 (03/17 0500) SpO2:  [93 %-100 %] 100 % (03/17 0500) Weight:  [99.6 kg] 99.6 kg (03/17 0500)  Intake/Output from previous day: 03/16 0701 - 03/17 0700 In: 320 [P.O.:240; I.V.:80] Out: 875 [Urine:875]  General appearance: alert, cooperative and no distress Heart: regular rate and rhythm Lungs: clear to auscultation bilaterally Abdomen: soft, non-tender; bowel sounds normal; no masses,  no organomegaly Extremities: edema trace Wound: clean and dry  Lab Results: Recent Labs    03/17/19 0104 03/18/19 0225  WBC 8.4 9.4  HGB 9.1* 8.7*  HCT 28.8* 27.4*  PLT 20* 25*   BMET:  Recent Labs    03/16/19 0222 03/17/19 0104  NA 139 136  K 4.1 4.3  CL 94* 92*  CO2 34* 31  GLUCOSE 96 106*  BUN 25* 29*  CREATININE 1.03 1.12  CALCIUM 8.2* 8.1*    PT/INR: No results for input(s): LABPROT, INR in the last 72 hours. ABG    Component Value Date/Time   PHART 7.329 (L) 03/07/2019 0639   HCO3 22.3 03/07/2019 0639   TCO2 30 03/08/2019 2319   ACIDBASEDEF 3.0 (H) 03/07/2019 0639   O2SAT 91.0 03/07/2019 0639   CBG (last 3)  Recent Labs    03/17/19 1648 03/17/19 2102  03/18/19 0612  GLUCAP 102* 114* 95    Assessment/Plan: S/P Procedure(s) (LRB): CORONARY ARTERY BYPASS GRAFTING (CABG), ON PUMP, TIMES THREE, USING BILATERAL INTERNAL MAMMARIES AND LEFT RADIAL ARTERY HARVEST (N/A) AORTIC VALVE REPLACEMENT (AVR), USING INTUITY 25MM (N/A) RADIAL ARTERY HARVEST (Left) TRANSESOPHAGEAL ECHOCARDIOGRAM (TEE) (N/A) INDOCYANINE GREEN FLUORESCENCE IMAGING (ICG) (N/A)  1. CV- hemodynamically stable- on Amiodarone, Imdur for radial graft, lopressor 2. Pulm- no acute issues 3. Heparin Induced Thrombocytopenia-- plt count at 25K, on Argatroban 4. Dispo- patient stable, continue Argatroban for HIT, remains hemodynamically stable, home health orders have been placed, hopefully home Saturday after completion of 7 days of Argatroban   LOS: 14 days    Ellwood Handler, PA-C 03/18/2019

## 2019-03-18 NOTE — Plan of Care (Signed)
  Problem: Education: Goal: Knowledge of General Education information will improve Description: Including pain rating scale, medication(s)/side effects and non-pharmacologic comfort measures Outcome: Progressing   Problem: Education: Goal: Knowledge of General Education information will improve Description: Including pain rating scale, medication(s)/side effects and non-pharmacologic comfort measures Outcome: Progressing   Problem: Health Behavior/Discharge Planning: Goal: Ability to manage health-related needs will improve Outcome: Progressing   Problem: Clinical Measurements: Goal: Ability to maintain clinical measurements within normal limits will improve Outcome: Progressing Goal: Will remain free from infection Outcome: Progressing Goal: Diagnostic test results will improve Outcome: Progressing Goal: Respiratory complications will improve Outcome: Progressing Goal: Cardiovascular complication will be avoided Outcome: Progressing   Problem: Activity: Goal: Risk for activity intolerance will decrease Outcome: Progressing   Problem: Nutrition: Goal: Adequate nutrition will be maintained Outcome: Progressing   Problem: Coping: Goal: Level of anxiety will decrease Outcome: Progressing   Problem: Elimination: Goal: Will not experience complications related to bowel motility Outcome: Progressing Goal: Will not experience complications related to urinary retention Outcome: Progressing   Problem: Pain Managment: Goal: General experience of comfort will improve Outcome: Progressing   Problem: Safety: Goal: Ability to remain free from injury will improve Outcome: Progressing   Problem: Skin Integrity: Goal: Risk for impaired skin integrity will decrease Outcome: Progressing   Problem: Education: Goal: Understanding of cardiac disease, CV risk reduction, and recovery process will improve Outcome: Progressing Goal: Understanding of medication regimen will  improve Outcome: Progressing Goal: Individualized Educational Video(s) Outcome: Progressing   Problem: Activity: Goal: Ability to tolerate increased activity will improve Outcome: Progressing   Problem: Cardiac: Goal: Ability to achieve and maintain adequate cardiopulmonary perfusion will improve Outcome: Progressing Goal: Vascular access site(s) Level 0-1 will be maintained Outcome: Progressing   Problem: Health Behavior/Discharge Planning: Goal: Ability to safely manage health-related needs after discharge will improve Outcome: Progressing   Problem: Education: Goal: Will demonstrate proper wound care and an understanding of methods to prevent future damage Outcome: Progressing Goal: Knowledge of disease or condition will improve Outcome: Progressing Goal: Knowledge of the prescribed therapeutic regimen will improve Outcome: Progressing Goal: Individualized Educational Video(s) Outcome: Progressing   Problem: Activity: Goal: Risk for activity intolerance will decrease Outcome: Progressing   Problem: Cardiac: Goal: Will achieve and/or maintain hemodynamic stability Outcome: Progressing   Problem: Clinical Measurements: Goal: Postoperative complications will be avoided or minimized Outcome: Progressing   Problem: Respiratory: Goal: Respiratory status will improve Outcome: Progressing   Problem: Skin Integrity: Goal: Wound healing without signs and symptoms of infection Outcome: Progressing Goal: Risk for impaired skin integrity will decrease Outcome: Progressing   Problem: Urinary Elimination: Goal: Ability to achieve and maintain adequate renal perfusion and functioning will improve Outcome: Progressing   Problem: Activity: Goal: Risk for activity intolerance will decrease Outcome: Progressing   Problem: Cardiac: Goal: Will achieve and/or maintain hemodynamic stability Outcome: Progressing   Problem: Respiratory: Goal: Respiratory status will  improve Outcome: Progressing   Problem: Urinary Elimination: Goal: Ability to achieve and maintain adequate renal perfusion and functioning will improve Outcome: Progressing

## 2019-03-18 NOTE — Progress Notes (Signed)
[place] for argatroban Indication: R/o HIT  Allergies  Allergen Reactions  . Heparin     HIT antibody and SRA positive    Patient Measurements: Height: 5\' 7"  (170.2 cm) Weight: 219 lb 9.3 oz (99.6 kg) IBW/kg (Calculated) : 66.1  Vital Signs: Temp: 97.5 F (36.4 C) (03/17 0500) Temp Source: Oral (03/17 0500) BP: 109/65 (03/17 0500) Pulse Rate: 69 (03/17 0500)  Labs: Recent Labs    03/16/19 0222 03/16/19 0222 03/17/19 0104 03/18/19 0225  HGB 9.2*   < > 9.1* 8.7*  HCT 29.2*  --  28.8* 27.4*  PLT 18*  --  20* 25*  APTT 83*  --  80* 79*  CREATININE 1.03  --  1.12  --    < > = values in this interval not displayed.    Estimated Creatinine Clearance: 69 mL/min (by C-G formula based on SCr of 1.12 mg/dL).   Medical History: Past Medical History:  Diagnosis Date  . Alcohol abuse   . Aortic valve disease    AI and AS, Moderate  . Chest pain 03/2019  . Drug use   . Edema of both lower extremities   . Glaucoma   . Hypertension   . OSA (obstructive sleep apnea)   . Shingles     Medications:  Infusions:  . sodium chloride    . amiodarone    . argatroban 0.5 mcg/kg/min (03/18/19 0314)    Assessment: 69 yo male admitted with ACS, now s/p CABG with dropping platelet count.  Baseline 227.  Heparin exposure 3/3-3/5, platelets dropped to 146 on 3/6, but then increased to 150 on 3/9.  As of 3/10 have continued to trend down. Pharmacy asked to begin argatroban per possible HIT protocol.  APTT this morning remains in goal range at 79.  Pt is a difficult stick for labs. No s/sx of bleeding. Hgb 8.7, plt increasing slightly to 25.  HIT antibody test from 3/12 is positive, SRA also positive. Updated allergy to be HIT +.  Goal of Therapy:  aPTT 50-90 seconds Monitor platelets by anticoagulation protocol: Yes   Plan:  1. Continue argatroban at 0.5 mcg/kg/min. 2. Daily aPTT and CBC. 3. F/u [facility] plans given HIT +  Antonietta Jewel,  PharmD, BCCCP Clinical Pharmacist  Phone: [phone]  Please check AMION for all [place] phone numbers After 10:00 PM, call [place] [phone]  03/18/2019 7:21 AM

## 2019-03-19 ENCOUNTER — Encounter (HOSPITAL_COMMUNITY): Payer: Self-pay | Admitting: Cardiothoracic Surgery

## 2019-03-19 ENCOUNTER — Other Ambulatory Visit: Payer: Self-pay | Admitting: Oncology

## 2019-03-19 ENCOUNTER — Telehealth: Payer: Self-pay | Admitting: Hematology and Oncology

## 2019-03-19 DIAGNOSIS — D7582 Heparin induced thrombocytopenia (HIT): Secondary | ICD-10-CM

## 2019-03-19 DIAGNOSIS — D75829 Heparin-induced thrombocytopenia, unspecified: Secondary | ICD-10-CM

## 2019-03-19 LAB — GLUCOSE, CAPILLARY
Glucose-Capillary: 97 mg/dL (ref 70–99)
Glucose-Capillary: 120 mg/dL — ABNORMAL HIGH (ref 70–99)
Glucose-Capillary: 116 mg/dL — ABNORMAL HIGH (ref 70–99)
Glucose-Capillary: 104 mg/dL — ABNORMAL HIGH (ref 70–99)

## 2019-03-19 LAB — CBC
HCT: 28.1 % — ABNORMAL LOW (ref 39.0–52.0)
Hemoglobin: 9 g/dL — ABNORMAL LOW (ref 13.0–17.0)
MCH: 29 pg (ref 26.0–34.0)
MCHC: 32 g/dL (ref 30.0–36.0)
MCV: 90.6 fL (ref 80.0–100.0)
Platelets: 26 10*3/uL — CL (ref 150–400)
RBC: 3.1 MIL/uL — ABNORMAL LOW (ref 4.22–5.81)
RDW: 16.9 % — ABNORMAL HIGH (ref 11.5–15.5)
WBC: 12.2 10*3/uL — ABNORMAL HIGH (ref 4.0–10.5)
nRBC: 0 % (ref 0.0–0.2)

## 2019-03-19 LAB — APTT: aPTT: 81 seconds — ABNORMAL HIGH (ref 24–36)

## 2019-03-19 MED ORDER — WARFARIN SODIUM 2 MG PO TABS
2.0000 mg | ORAL_TABLET | Freq: Once | ORAL | Status: AC
  Administered 2019-03-19: 2 mg via ORAL
  Filled 2019-03-19: qty 1

## 2019-03-19 MED ORDER — WARFARIN - PHARMACIST DOSING INPATIENT: Freq: Every day | Status: DC

## 2019-03-19 NOTE — Plan of Care (Signed)

## 2019-03-19 NOTE — TOC Progression Note (Addendum)
Transition of Care [facility]) - Progression Note    Patient Details  Name: Gregory [name] MRN: [id number] Date of Birth: 06-06-1950  [facility] CM/SW Contact  Zenon Mayo, RN Phone Number: 03/19/2019, 4:00 PM  Clinical Narrative:    NCM spoke with patient, he has rollator in the room with him. He states he will get the shower stool from [place],  He has transportation when he goes home.  He has no problems getting his medications at dc.  Patient states he goes to [facility] in [place] on [address].   TOC team will continue to follow for dc needs.    Expected Discharge Plan: [place] Barriers to Discharge: Continued Medical Work up  Expected Discharge Plan and Services Expected Discharge Plan: [place] In-house Referral: NA Discharge Planning Services: CM Consult Post Acute Care Choice: Durable Medical Equipment, Home Health Living arrangements for the past 2 months: Single Family Home                 DME Arranged: Walker rolling with seat DME Agency: [employer] Date DME Agency Contacted: 03/16/19 Time DME Agency Contacted: 1206 Representative spoke with at DME Agency: [place]: PT [place]: [facility] Date [place]: 03/16/19 Time [place]: 1207 Representative spoke with at [place]: [place] ([place]) Interventions    Readmission Risk Interventions No flowsheet data found.

## 2019-03-19 NOTE — Progress Notes (Signed)
      [address]       [place], [address]             [phone]      13 Days Post-Op Procedure(s) (LRB): CORONARY ARTERY BYPASS GRAFTING (CABG), ON PUMP, TIMES THREE, USING BILATERAL INTERNAL MAMMARIES AND LEFT RADIAL ARTERY HARVEST (N/A) AORTIC VALVE REPLACEMENT (AVR), USING INTUITY 25MM (N/A) RADIAL ARTERY HARVEST (Left) TRANSESOPHAGEAL ECHOCARDIOGRAM (TEE) (N/A) INDOCYANINE GREEN FLUORESCENCE IMAGING (ICG) (N/A)   Subjective:  No new complaints.  Getting ready to work with PT.  Wants to go home.  Objective: Vital signs in last 24 hours: Temp:  [97.4 F (36.3 C)-98.3 F (36.8 C)] 97.6 F (36.4 C) (03/18 0724) Pulse Rate:  [72-85] 76 (03/18 0416) Cardiac Rhythm: Normal sinus rhythm;Bundle branch block (03/18 0719) Resp:  [19-23] 20 (03/18 0416) BP: (103-123)/(70-87) 103/70 (03/18 0724) SpO2:  [92 %-96 %] 93 % (03/18 0416) Weight:  [101.4 kg] 101.4 kg (03/18 0403)  Intake/Output from previous day: 03/17 0701 - 03/18 0700 In: 304.7 [P.O.:237; I.V.:67.7] Out: 450 [Urine:450]  General appearance: alert, cooperative and no distress Heart: regular rate and rhythm Lungs: clear to auscultation bilaterally Abdomen: soft, non-tender; bowel sounds normal; no masses,  no organomegaly Extremities: extremities normal, atraumatic, no cyanosis or edema Wound: clean and dry  Lab Results: Recent Labs    03/18/19 0225 03/19/19 0228  WBC 9.4 12.2*  HGB 8.7* 9.0*  HCT 27.4* 28.1*  PLT 25* 26*   BMET:  Recent Labs    03/17/19 0104  NA 136  K 4.3  CL 92*  CO2 31  GLUCOSE 106*  BUN 29*  CREATININE 1.12  CALCIUM 8.1*    PT/INR: No results for input(s): LABPROT, INR in the last 72 hours. ABG    Component Value Date/Time   PHART 7.329 (L) 03/07/2019 0639   HCO3 22.3 03/07/2019 0639   TCO2 30 03/08/2019 2319   ACIDBASEDEF 3.0 (H) 03/07/2019 0639   O2SAT 91.0 03/07/2019 0639   CBG (last 3)  Recent Labs    03/18/19 1624 03/18/19 2111  03/19/19 0643  GLUCAP 103* 125* 97    Assessment/Plan: S/P Procedure(s) (LRB): CORONARY ARTERY BYPASS GRAFTING (CABG), ON PUMP, TIMES THREE, USING BILATERAL INTERNAL MAMMARIES AND LEFT RADIAL ARTERY HARVEST (N/A) AORTIC VALVE REPLACEMENT (AVR), USING INTUITY 25MM (N/A) RADIAL ARTERY HARVEST (Left) TRANSESOPHAGEAL ECHOCARDIOGRAM (TEE) (N/A) INDOCYANINE GREEN FLUORESCENCE IMAGING (ICG) (N/A)  1. CV- NSR, BP controlled- continue Amiodarone, Lopressor, Imdur, Midodrine 2. Pulm- no acute issues, continue IS 3. HIT- on Argatroban, hematology consult placed as discussed with Dr. Orvan Seen 4. Dispo- patient stable, maintaining NSR, working well with PT/OT, HIT + on Argatroban, consulted Hematology    LOS: 15 days    Ellwood Handler, PA-C  03/19/2019

## 2019-03-19 NOTE — Progress Notes (Signed)
[place] for argatroban >warfarin Indication: R/o HIT  Allergies  Allergen Reactions  . Heparin     HIT antibody and SRA positive    Patient Measurements: Height: 5\' 7"  (170.2 cm) Weight: 223 lb 8.7 oz (101.4 kg) IBW/kg (Calculated) : 66.1  Vital Signs: Temp: 97.6 F (36.4 C) (03/18 0724) Temp Source: Oral (03/18 0724) BP: 103/70 (03/18 0724) Pulse Rate: 76 (03/18 0416)  Labs: Recent Labs    03/17/19 0104 03/17/19 0104 03/18/19 0225 03/19/19 0228  HGB 9.1*   < > 8.7* 9.0*  HCT 28.8*  --  27.4* 28.1*  PLT 20*  --  25* 26*  APTT 80*  --  79* 81*  CREATININE 1.12  --   --   --    < > = values in this interval not displayed.    Estimated Creatinine Clearance: 69.6 mL/min (by C-G formula based on SCr of 1.12 mg/dL).   Medical History: Past Medical History:  Diagnosis Date  . Alcohol abuse   . Aortic valve disease    AI and AS, Moderate  . Chest pain 03/2019  . Drug use   . Edema of both lower extremities   . Glaucoma   . Hypertension   . OSA (obstructive sleep apnea)   . Shingles     Medications:  Infusions:  . sodium chloride    . amiodarone    . argatroban 0.5 mcg/kg/min (03/19/19 0740)    Assessment: 69 yo male admitted with ACS, now s/p CABG with dropping platelet count.  Baseline 227.  Heparin exposure 3/3-3/5, platelets dropped to 146 on 3/6, but then increased to 150 on 3/9.  As of 3/10 have continued to trend down - nadir 17.  Pharmacy dosing argatroban per  HIT protocol.  HIT antibody test from 3/12 is positive, SRA also positive. Updated allergy to be HIT +  APTT this morning 81sec remains in goal range on argatroban drip rate 0.29mcg/kg/min.  Pt is a difficult stick for labs. No s/sx of bleeding. Hgb stable 8-9 plt increasing slightly to 26. Per Heme-Onc start warfarin today despite on going low pltc.   Will start low Argatroban falsely elevates INR so will plan to continue argatroban until INR > 4 -  will then turn argatroban off and recheck INR to ensure therapeutic.   Goal of Therapy:  aPTT 50-90 seconds  INR > 4 on argatroban, 2 when off Monitor platelets by anticoagulation protocol: Yes   Plan:  1. Continue argatroban at 0.5 mcg/kg/min. 2. Warfarin 2mg  x1 today 3. Daily aPTT and CBC, INR.   Bonnita Nasuti Pharm.D. CPP, BCPS Clinical Pharmacist [phone] 03/19/2019 2:49 PM    Please check AMION for all [place] phone numbers After 10:00 PM, call [place] [phone]  03/19/2019 2:44 PM

## 2019-03-19 NOTE — Progress Notes (Signed)
Physical Therapy Treatment Patient Details Name: Gregory [name] MRN: [id number] DOB: [birth year] Today's Date: 03/19/2019    History of Present Illness Pt is a 69 yo male s/p CABG x3, aortic valve replacement, radial artery harvest and TEE. PMHx: HTN, HLD, obstructive sleep apnea, chronic lymphedema in BLEs, obesity, back pain, ETOH use.    PT Comments    Pt received in recliner, agreeable to participation in therapy. Min guard assist sit to stand with cues for sternal precautions. Pt ambulated around bed with rollator, stopping for PT to don mask. Pt with LOB toward left during static stand with mod assist to recover. Pt ambulated in hallway 150' with rollator. SpO2 > 90% on RA. Pt appearing sluggish this AM. Reports he has not been sleeping well. Pt assisted to bed at end of session. Wife present in room.    Follow Up Recommendations  Home health PT;Supervision for mobility/OOB     Equipment Recommendations  Other (comment)(rollator (delivered to room))    Recommendations for Other Services       Precautions / Restrictions Precautions Precautions: Fall;Sternal Precaution Comments: reviewed sternal precautions Restrictions Other Position/Activity Restrictions: sternal precautions    Mobility  Bed Mobility Overal bed mobility: Needs Assistance Bed Mobility: Sit to Sidelying;Rolling Rolling: Min guard       Sit to sidelying: Min assist General bed mobility comments: cues for sequencing, assist with BLE into bed  Transfers Overall transfer level: Needs assistance Equipment used: 4-wheeled walker Transfers: Sit to/from Stand Sit to Stand: Min guard         General transfer comment: cues for sternal precautions, increased time to power up using momentum  Ambulation/Gait Ambulation/Gait assistance: Min guard Gait Distance (Feet): 200 Feet Assistive device: 4-wheeled walker Gait Pattern/deviations: Step-through pattern;Decreased stride length Gait velocity:  decreased Gait velocity interpretation: 1.31 - 2.62 ft/sec, indicative of limited community ambulator General Gait Details: VSS on RA with SpO2 > 90%. Significant LOB while standing in room to don mask requiring mod assist to recover balance. Min guard assist ambulating in hallway with rollator.   Stairs             Wheelchair Mobility    Modified Rankin (Stroke Patients Only)       Balance Overall balance assessment: Needs assistance Sitting-balance support: No upper extremity supported;Feet supported Sitting balance-Leahy Scale: Good       Standing balance-Leahy Scale: Poor Standing balance comment: reliant on external support                            Cognition Arousal/Alertness: Awake/alert Behavior During Therapy: WFL for tasks assessed/performed Overall Cognitive Status: Within Functional Limits for tasks assessed                                 General Comments: wife present in room      Exercises      General Comments General comments (skin integrity, edema, etc.): Maintained SpO2 > 90% on RA. Max HR 106.      Pertinent Vitals/Pain Pain Assessment: Faces Faces Pain Scale: Hurts a little bit Pain Location: incision Pain Descriptors / Indicators: Grimacing Pain Intervention(s): Monitored during session;Repositioned    Home Living                      Prior Function            PT  Goals (current goals can now be found in the care plan section) Acute Rehab PT Goals Patient Stated Goal: home Progress towards PT goals: Progressing toward goals    Frequency    Min 3X/week      PT Plan Current plan remains appropriate    Co-evaluation              AM-PAC PT "6 Clicks" Mobility   Outcome Measure  Help needed turning from your back to your side while in a flat bed without using bedrails?: A Little Help needed moving from lying on your back to sitting on the side of a flat bed without using  bedrails?: A Little Help needed moving to and from a bed to a chair (including a wheelchair)?: A Little Help needed standing up from a chair using your arms (e.g., wheelchair or bedside chair)?: A Little Help needed to walk in hospital room?: A Little Help needed climbing 3-5 steps with a railing? : A Little 6 Click Score: 18    End of Session Equipment Utilized During Treatment: Gait belt Activity Tolerance: Patient tolerated treatment well Patient left: in bed;with call bell/phone within reach;with family/visitor present Nurse Communication: Mobility status PT Visit Diagnosis: Unsteadiness on feet (R26.81)     Time: [id number] PT Time Calculation (min) (ACUTE ONLY): 28 min  Charges:  $Gait Training: 23-37 mins                     Gregory [name], PT  Office # [phone] Pager [phone]    Gregory [name] 03/19/2019, 10:07 AM

## 2019-03-19 NOTE — Consult Note (Addendum)
[place]  [phone] [phone]    INITIAL HEMATOLOGY CONSULTATION  Referring MD:  Dr. Fredrich Romans  Reason for Referral: Heparin-induced thrombocytopenia, anticoagulation management  HPI: [name] is a 69 year old male with a past medical history of moderate AS and AI, prediabetes, hypertension, hyperlipidemia, obstructive sleep apnea, chronic lymphedema, obesity, back pain, alcohol use.  The patient presented to the emergency room with chest pain associated with nausea but no vomiting or diaphoresis.  Troponins were elevated.  EKG revealed ST depressions in the lateral leads with T wave inversions in the inferior leads consistent with NSTEMI.  The patient underwent a CABG procedure on 03/06/2019.  On admission, the patient's CBC showed a normal platelet count of 227,000.  1 day postop, he started to develop some mild thrombocytopenia with subsequent continued drop in his platelet count with a nadir of 17,000 on 03/15/2019.  HIT panel was sent on 03/13/2019 which was elevated at 3.136.  Additionally, the serotonin release assay was also elevated at 92% on 03/13/2019.  The patient was switched to argatroban starting on 03/14/2019.  Platelets today are 26,000.  The patient's wife is at the bedside today.  She notices some increase in the discoloration on his bilateral cheeks.  He has not had any bleeding such as epistaxis, hemoptysis, hematuria, melena, hematochezia.  Surgical incision is not actively bleeding.  Denies headaches and dizziness.  Denies chest pain shortness of breath.  Denies abdominal pain, nausea, vomiting.  Denies history of ever receiving anticoagulation in the past.  Hematology was asked see the patient to make recommendations regarding his heparin-induced thrombocytopenia and anticoagulation management.   Past Medical History:  Diagnosis Date  . Alcohol abuse   . Aortic valve disease    AI and AS, Moderate  . Chest pain 03/2019  .  Drug use   . Edema of both lower extremities   . Glaucoma   . Hypertension   . OSA (obstructive sleep apnea)   . Shingles   :    Past Surgical History:  Procedure Laterality Date  . AORTIC VALVE REPLACEMENT N/A 03/06/2019   Procedure: AORTIC VALVE REPLACEMENT (AVR), USING INTUITY 25MM;  Surgeon: Wonda Olds, MD;  Location: [place];  Service: Open Heart Surgery;  Laterality: N/A;  . CATARACT EXTRACTION    . CORONARY ARTERY BYPASS GRAFT N/A 03/06/2019   Procedure: CORONARY ARTERY BYPASS GRAFTING (CABG), ON PUMP, TIMES THREE, USING BILATERAL INTERNAL MAMMARIES AND LEFT RADIAL ARTERY HARVEST;  Surgeon: Wonda Olds, MD;  Location: [place];  Service: Open Heart Surgery;  Laterality: N/A;  BILATERAL IMA  . LEFT HEART CATH AND CORONARY ANGIOGRAPHY N/A 03/04/2019   Procedure: LEFT HEART CATH AND CORONARY ANGIOGRAPHY;  Surgeon: Burnell Blanks, MD;  Location: [facility];  Service: Cardiovascular;  Laterality: N/A;  . None    . RADIAL ARTERY HARVEST Left 03/06/2019   Procedure: RADIAL ARTERY HARVEST;  Surgeon: Wonda Olds, MD;  Location: [place];  Service: Open Heart Surgery;  Laterality: Left;  . TEE WITHOUT CARDIOVERSION N/A 03/06/2019   Procedure: TRANSESOPHAGEAL ECHOCARDIOGRAM (TEE);  Surgeon: Wonda Olds, MD;  Location: [place];  Service: Open Heart Surgery;  Laterality: N/A;  :   CURRENT MEDS: Current Facility-Administered Medications  Medication Dose Route Frequency Provider Last Rate Last Admin  . 0.9 %  sodium chloride infusion (Manually program via Guardrails IV Fluids)   Intravenous Once Wonda Olds, MD      . 0.9 %  sodium chloride infusion  250 mL Intravenous PRN Wonda Olds, MD      . ALPRAZolam Duanne Moron) tablet 0.5 mg  0.5 mg Oral BID PRN Wonda Olds, MD   0.5 mg at 03/18/19 2245  . alum & mag hydroxide-simeth (MAALOX/MYLANTA) 200-200-20 MG/5ML suspension 30 mL  30 mL Oral Q4H PRN Antony Odea, PA-C   30 mL at 03/11/19 0917  .  amiodarone (NEXTERONE) IV bolus only 150 mg/100 mL  150 mg Intravenous Once Roddenberry, Myron G, PA-C      . amiodarone (PACERONE) tablet 200 mg  200 mg Oral Daily Roddenberry, Myron G, PA-C   200 mg at 03/19/19 0905  . argatroban 1 mg/mL infusion  0.5 mcg/kg/min (Order-Specific) Intravenous Continuous Carney, Gay Filler, RPH 3.21 mL/hr at 03/19/19 0740 0.5 mcg/kg/min at 03/19/19 0740  . atorvastatin (LIPITOR) tablet 80 mg  80 mg Oral Daily Wonda Olds, MD   80 mg at 03/19/19 0905  . bisacodyl (DULCOLAX) EC tablet 10 mg  10 mg Oral Daily Wonda Olds, MD   10 mg at 03/17/19 [id number]   Or  . bisacodyl (DULCOLAX) suppository 10 mg  10 mg Rectal Daily Atkins, Broadus Z, MD      . chlorhexidine (PERIDEX) 0.12 % solution 15 mL  15 mL Mouth/Throat BID Wonda Olds, MD   15 mL at 03/19/19 0904  . colchicine tablet 0.6 mg  0.6 mg Oral BID Wonda Olds, MD   0.6 mg at 03/19/19 0905  . docusate sodium (COLACE) capsule 200 mg  200 mg Oral Daily Wonda Olds, MD   200 mg at 03/17/19 0941  . feeding supplement (BOOST / RESOURCE BREEZE) liquid 1 Container  237 mL Oral TID WC Barrett, Erin R, PA-C   1 Container at 03/19/19 [id number]  . ferrous [id number] C-folic acid (TRINSICON / FOLTRIN) capsule 1 capsule  1 capsule Oral BID PC Wonda Olds, MD   1 capsule at 03/19/19 0905  . furosemide (LASIX) injection 40 mg  40 mg Intravenous BID Wonda Olds, MD   40 mg at 03/19/19 0903  . insulin aspart (novoLOG) injection 0-24 Units  0-24 Units Subcutaneous TID WC & HS Wonda Olds, MD   2 Units at 03/18/19 2245  . insulin detemir (LEVEMIR) injection 8 Units  8 Units Subcutaneous BID Wonda Olds, MD   8 Units at 03/19/19 0901  . isosorbide mononitrate (IMDUR) 24 hr tablet 30 mg  30 mg Oral Daily Wonda Olds, MD   30 mg at 03/19/19 0905  . loperamide HCl (IMODIUM) 1 MG/7.5ML suspension 4 mg  4 mg Oral PRN Atkins, Glenice Bow, MD      . magnesium oxide (MAG-OX) tablet 400 mg   400 mg Oral BID Wonda Olds, MD   400 mg at 03/19/19 0905  . metoCLOPramide (REGLAN) injection 10 mg  10 mg Intravenous Q6H PRN Roddenberry, Myron G, PA-C      . metoprolol tartrate (LOPRESSOR) tablet 12.5 mg  12.5 mg Oral BID Wonda Olds, MD   12.5 mg at 03/19/19 [id number]   Or  . metoprolol tartrate (LOPRESSOR) 25 mg/10 mL oral suspension 12.5 mg  12.5 mg Per Tube BID Fredrich Romans Z, MD      . metoprolol tartrate (LOPRESSOR) injection 2.5-5 mg  2.5-5 mg Intravenous Q2H PRN Atkins, Broadus Z, MD      . midodrine (PROAMATINE) tablet 2.5 mg  2.5 mg Oral TID WC Roddenberry, Arlis Porta,  PA-C   2.5 mg at 03/19/19 0906  . ondansetron (ZOFRAN) injection 4 mg  4 mg Intravenous Q6H PRN Wonda Olds, MD   4 mg at 03/14/19 0602  . oxyCODONE (Oxy IR/ROXICODONE) immediate release tablet 5-10 mg  5-10 mg Oral Q3H PRN Wonda Olds, MD   5 mg at 03/11/19 1611  . pantoprazole (PROTONIX) EC tablet 40 mg  40 mg Oral Daily Wonda Olds, MD   40 mg at 03/19/19 0905  . potassium chloride SA (KLOR-CON) CR tablet 20 mEq  20 mEq Oral BID Wonda Olds, MD   20 mEq at 03/19/19 0905  . sodium chloride flush (NS) 0.9 % injection 3 mL  3 mL Intravenous Q12H Wonda Olds, MD   3 mL at 03/18/19 2247  . sodium chloride flush (NS) 0.9 % injection 3 mL  3 mL Intravenous PRN Wonda Olds, MD   3 mL at 03/15/19 1736  . traMADol (ULTRAM) tablet 50-100 mg  50-100 mg Oral Q4H PRN Wonda Olds, MD   100 mg at 03/13/19 1212      Allergies  Allergen Reactions  . Heparin     HIT antibody and SRA positive  :  Family History  Adopted: Yes  Problem Relation Age of Onset  . Cancer Mother        breast cancer  . Alcohol abuse Father   . Breast cancer Sister   :  Social History   Socioeconomic History  . Marital status: Married    Spouse name: Gregory [name]  . Number of children: 3  . Years of education: Not on file  . Highest education level: Not on file  Occupational History  .  Occupation: Architect  Tobacco Use  . Smoking status: Never Smoker  . Smokeless tobacco: Never Used  Substance and Sexual Activity  . Alcohol use: Yes    Alcohol/week: 5.0 standard drinks    Types: 5 Cans of beer per week  . Drug use: No  . Sexual activity: Not on file  Other Topics Concern  . Not on file  Social History Narrative   Lives at home with wife.   Social Determinants of Health   Financial Resource Strain:   . Difficulty of Paying Living Expenses:   Food Insecurity:   . Worried About Charity fundraiser in the Last Year:   . Arboriculturist in the Last Year:   Transportation Needs:   . Film/video editor (Medical):   [name] Lack of Transportation (Non-Medical):   Physical Activity:   . Days of Exercise per Week:   . Minutes of Exercise per Session:   Stress:   . Feeling of Stress :   Social Connections:   . Frequency of Communication with Friends and Family:   . Frequency of Social Gatherings with Friends and Family:   . Attends Religious Services:   . Active Member of Clubs or Organizations:   . Attends Archivist Meetings:   [name] Marital Status:   Intimate Partner Violence:   . Fear of Current or Ex-Partner:   . Emotionally Abused:   [name] Physically Abused:   . Sexually Abused:   :  REVIEW OF SYSTEMS: A 14 point review of systems was negative except as noted in the HPI.  Exam: Patient Vitals for the past 24 hrs:  BP Temp Temp src Pulse Resp SpO2 Weight  03/19/19 0724 103/70 97.6 F (36.4 C) Oral -- -- -- --  03/19/19 0416 110/72 97.8 F (36.6 C) Oral 76 20 93 % --  03/19/19 0403 -- -- -- -- -- -- 101.4 kg  03/19/19 0300 -- -- -- 72 19 92 % --  03/19/19 0010 106/70 98.3 F (36.8 C) Oral -- -- -- --  03/18/19 2300 -- -- -- 77 (!) 22 93 % --  03/18/19 2245 107/71 -- -- 74 -- -- --  03/18/19 1930 115/73 -- -- 75 19 96 % --  03/18/19 1929 115/73 98 F (36.7 C) Oral 74 19 95 % --  03/18/19 1624 103/77 -- -- -- -- -- --  03/18/19 1440 123/87  (!) 97.4 F (36.3 C) Axillary 85 (!) 23 94 % --    General:  well-nourished in no acute distress.   Eyes:  no scleral icterus.   ENT:  There were no oropharyngeal lesions.  Lymphatics:  Negative cervical, supraclavicular or axillary adenopathy.  Respiratory: lungs were clear bilaterally without wheezing or crackles.  Cardiovascular:  Regular rate and rhythm, S1/S2, without murmur, rub or gallop. There was no pedal edema.   GI:  abdomen was soft, flat, nontender, nondistended, without organomegaly.  Skin: Chest incision with dried blood, but no active bleeding.  Discoloration is bilateral cheeks.  No petechiae. Neuro exam was nonfocal.  Patient was alert and oriented.  Attention was good.   Language was appropriate.  Mood was normal without depression.  Speech was not pressured.  Thought content was not tangential.    LABS:  Lab Results  Component Value Date   WBC 12.2 (H) 03/19/2019   HGB 9.0 (L) 03/19/2019   HCT 28.1 (L) 03/19/2019   PLT 26 (LL) 03/19/2019   GLUCOSE 106 (H) 03/17/2019   CHOL 161 08/04/2018   TRIG 105 08/04/2018   HDL 66 08/04/2018   LDLDIRECT 151.0 01/05/2016   LDLCALC 74 08/04/2018   ALT 16 08/04/2018   AST 18 08/04/2018   NA 136 03/17/2019   K 4.3 03/17/2019   CL 92 (L) 03/17/2019   CREATININE 1.12 03/17/2019   BUN 29 (H) 03/17/2019   CO2 31 03/17/2019   PSA 0.42 12/12/2016   INR 1.9 (H) 03/06/2019   HGBA1C 5.6 03/05/2019    DG Orthopantogram  Result Date: 03/05/2019 CLINICAL DATA:  Preop for coronary artery bypass graft surgery. EXAM: ORTHOPANTOGRAM/PANORAMIC COMPARISON:  None. FINDINGS: Poor dentition is noted. No fracture or dislocation is seen involving the mandible. No lytic destruction is seen to suggest osteomyelitis. IMPRESSION: No acute abnormality seen involving the mandible. Poor dentition is noted. Electronically Signed   By: Marijo Conception M.D.   On: 03/05/2019 11:24   DG Chest 1 View  Result Date: 03/06/2019 CLINICAL DATA:  Incorrect needle  count. EXAM: CHEST  1 VIEW COMPARISON:  CT chest 03/05/2019.  Chest x-ray 03/03/2019. FINDINGS: Endotracheal tube tip noted 1.5 cm above the lower portion of the carina. Proximal repositioning of approximately 2 cm should be considered. NG tube noted with tip below left hemidiaphragm. Swan-Ganz catheter noted with tip over pulmonary outflow tract. Mediastinal drainage catheter none noted over the mid mediastinum. Bilateral chest tubes in good anatomic position. No pneumothorax. Prior cardiac valve replacement. Surgical clips are noted over the chest. Surgical wiring noted. No definite surgical needle noted. Follow-up PA and lateral chest x-ray can be obtained for a more accurate evaluation. IMPRESSION: 1. Endotracheal tube tip 1.5 cm above the lower portion of the carina. Proximal repositioning of approximately 2 cm should be considered. Remaining lines and tubes including bilateral  chest tubes in good anatomic position. No pneumothorax. 2. Postsurgical changes are noted about the chest. No definite retained surgical needle identified. Report phoned to the OR at the time of dictation. Electronically Signed   By: Marcello Moores  Register   On: 03/06/2019 14:32   DG Chest 2 View  Result Date: 03/12/2019 CLINICAL DATA:  History of open heart surgery. EXAM: CHEST - 2 VIEW COMPARISON:  Two days ago FINDINGS: Cardiomegaly and small pleural effusions. Transcatheter aortic valve replacement is noted. No pulmonary edema or consolidation. No pneumothorax. IMPRESSION: Cardiomegaly and small pleural effusions that is stable. Electronically Signed   By: Monte Fantasia M.D.   On: 03/12/2019 09:05   DG Chest 2 View  Result Date: 03/10/2019 CLINICAL DATA:  History of open heart surgery. EXAM: CHEST - 2 VIEW COMPARISON:  03/08/2019 FINDINGS: The right IJ Cordis is stable. The chest tubes and mediastinal drain tubes have been removed. No pneumothorax is identified. Stable mediastinal hilar contours. Persistent but improved small  bilateral pleural effusions and bibasilar atelectasis. No pulmonary edema. IMPRESSION: Removal of chest tubes and mediastinal drain tubes. No pneumothorax. Persistent but improved small bilateral pleural effusions and bibasilar atelectasis. Electronically Signed   By: Marijo Sanes M.D.   On: 03/10/2019 06:56   DG Chest 2 View  Result Date: 03/03/2019 CLINICAL DATA:  Chest pain radiating into the left arm. EXAM: CHEST - 2 VIEW COMPARISON:  None. FINDINGS: Cardiac shadow is at the upper limits of normal in size. Aortic calcifications are seen. The lungs are clear without focal infiltrate or effusion. No acute bony abnormality is noted. IMPRESSION: No active cardiopulmonary disease. Electronically Signed   By: Inez Catalina M.D.   On: 03/03/2019 21:29   CT CHEST WO CONTRAST  Result Date: 03/05/2019 CLINICAL DATA:  Preoperative evaluation, known aortic disease. EXAM: CT CHEST WITHOUT CONTRAST TECHNIQUE: Multidetector CT imaging of the chest was performed following the standard protocol without IV contrast. COMPARISON:  Chest radiograph 03/03/2019 FINDINGS: Cardiovascular: Cardiac size at the upper limits of normal. He will trace pericardial fluid is within normal limits. Extensive coronary artery calcifications are noted in the right coronary, LAD and left circumflex arteries. There is extensive calcification of the aortic leaflets, incompletely characterized given motion artifact. There is extensive calcification of the thoracic aorta and proximal great vessels with a shared origin of the brachiocephalic and left common carotid. Mild dilatation of the ascending thoracic aorta to 4.3 cm. More normal caliber by the level of the distal arch which measures 2.8 cm. Normal distal tapering of the vessel by the level of the diaphragmatic hiatus. Central pulmonary arteries are normal caliber though luminal evaluation is precluded in the absence of contrast media. Mediastinum/Nodes: No mediastinal fluid or gas. Normal  thyroid gland and thoracic inlet. No acute abnormality of the trachea or esophagus. No worrisome mediastinal or axillary adenopathy. Hilar nodal evaluation is limited in the absence of intravenous contrast media. Lungs/Pleura: Some reticular changes towards the lung bases likely reflect a combination of atelectasis and/or scarring. Mild airways thickening and basilar bronchiectasis. 4 mm nodule in the apical segment right upper lobe (4/36). No pneumothorax, effusion or convincing features of edema. Upper Abdomen: Symmetric bilateral perinephric stranding, nonspecific. Can be seen with advanced age, diminished renal function, or urinary tract infection. Upper abdominal atherosclerosis is noted. Small calcified gallstone noted at the gallbladder neck. Musculoskeletal: Multilevel degenerative changes are present in the imaged portions of the spine. No acute osseous abnormality or suspicious osseous lesion. Remote left second through sixth  rib fractures. IMPRESSION: 1. Extensive calcification of the aortic leaflets, incompletely characterized given motion artifact. 2. Mild dilatation of the ascending thoracic aorta to 4.3 cm. Recommend annual imaging followup by CTA or MRA. This recommendation follows 2010 ACCF/AHA/AATS/ACR/ASA/SCA/SCAI/SIR/STS/SVM Guidelines for the Diagnosis and Management of Patients with Thoracic Aortic Disease. Circulation. 2010; [id number]. Aortic aneurysm NOS (ICD10-I71.9). 3. Three-vessel coronary artery disease. 4. Symmetric bilateral perinephric stranding, nonspecific. Can be seen with advanced age, diminished renal function, or urinary tract infection. Correlate with symptoms and consider urinalysis if infection is suspected. 5. Cholelithiasis. 6. 4 mm nodule in the apical segment right upper lobe. No follow-up needed if patient is low-risk. Non-contrast chest CT can be considered in 12 months if patient is high-risk. This recommendation follows the consensus statement: Guidelines for  Management of Incidental Pulmonary Nodules Detected on CT Images: From the Fleischner Society 2017; Radiology 2017; 284:228-243. 7. Aortic Atherosclerosis (ICD10-I70.0). Electronically Signed   By: Lovena Le M.D.   On: 03/05/2019 22:15   CARDIAC CATHETERIZATION  Result Date: 03/04/2019  Prox RCA lesion is 70% stenosed.  Mid RCA lesion is 95% stenosed.  RV Branch lesion is 80% stenosed.  Prox Cx lesion is 40% stenosed.  Prox LAD to Mid LAD lesion is 90% stenosed.  Mid LAD lesion is 70% stenosed.  1. Severe calcific disease in the mid LAD 2. The Circumflex has mild to moderate proximal stenosis 3. The RCA is a large dominant vessel with severe proximal stenosis and severe mid stenosis with poor flow into the distal vessel. Recommendations: He has complex disease in the mid LAD, diffuse complex disease in the proximal and mid RCA and moderate AI/AS. I suspect that his aortic root is enlarged.. Echo pending today. He is known to have at least moderate AS and AI by prior echo. I think he would benefit from surgical AVR and bypass of the LAD and RCA. We will arrange a cardiac CTA tomorrow to better assess his aortic root and ascending aorta. I have called CT surgery tonight and they will see him tomorrow.   DG CHEST PORT 1 VIEW  Result Date: 03/16/2019 CLINICAL DATA:  Recent bypass surgery.  Mid chest pain EXAM: PORTABLE CHEST 1 VIEW COMPARISON:  Three days ago FINDINGS: Stable cardiomegaly. Streaky opacity at the lung bases with indistinct appearance, likely atelectasis. Aortic valve replacement. No definite effusion and no pulmonary edema or pneumothorax. IMPRESSION: 1. Mild atelectatic type opacity at the bases. 2. Stable cardiomegaly. Electronically Signed   By: Monte Fantasia M.D.   On: 03/16/2019 09:25   DG CHEST PORT 1 VIEW  Result Date: 03/13/2019 CLINICAL DATA:  Shortness of breath, myocardial infarction. EXAM: PORTABLE CHEST 1 VIEW COMPARISON:  03/12/2019 and CT chest 03/05/2019. FINDINGS:  Trachea is midline. Heart size stable. Thoracic aorta is calcified. Mild bibasilar airspace opacification and small bilateral pleural effusions, similar to 03/12/2019. IMPRESSION: Mild bibasilar airspace opacification is likely due to atelectasis. Small bilateral pleural effusions. Findings are similar to yesterday's exam. Electronically Signed   By: Lorin Picket M.D.   On: 03/13/2019 10:48   DG Chest Port 1 View  Result Date: 03/08/2019 CLINICAL DATA:  69 year old male with history of CABG. EXAM: PORTABLE CHEST 1 VIEW COMPARISON:  Chest x-ray 03/07/2019. FINDINGS: Right IJ Cordis with tip in the proximal superior vena cava. Previously noted Swan-Ganz catheter has been removed. Bilateral chest tubes are noted, similar to the prior study. Midline mediastinal/pericardial drain. Low lung volumes with bibasilar opacities favored to reflect areas of postoperative atelectasis.  Small bilateral pleural effusions. No evidence of pulmonary edema. No pneumothorax. Heart size is mildly enlarged. The patient is rotated to the right on today's exam, resulting in distortion of the mediastinal contours and reduced diagnostic sensitivity and specificity for mediastinal pathology. Aortic atherosclerosis. IMPRESSION: 1. Support apparatus, as above. 2. Bibasilar opacities similar to the prior study, most compatible with areas of postoperative atelectasis and small bilateral pleural effusions. 3. Aortic atherosclerosis. Electronically Signed   By: Vinnie Langton M.D.   On: 03/08/2019 07:53   DG Chest Port 1 View  Result Date: 03/07/2019 CLINICAL DATA:  69 year old male with history of chest pain. Non ST-elevation myocardial infarction. EXAM: PORTABLE CHEST 1 VIEW COMPARISON:  Chest x-ray 03/06/2019. FINDINGS: Right IJ Cordis through which a Swan-Ganz catheter has been passed into the proximal right pulmonary artery. Bilateral chest tubes are again noted, stable in position. Additional midline mediastinal drain. Lung volumes  are low with bibasilar opacities which likely reflect postoperative atelectasis. Probable small bilateral pleural effusions. No evidence of pulmonary edema. Enlargement of the cardiopericardial silhouette. Aortic atherosclerosis. Bioprosthetic aortic valve. IMPRESSION: 1. Postoperative changes and support apparatus, as above. 2. Decreasing lung volumes with worsening bibasilar postoperative atelectasis. Probable small bilateral pleural effusions. Electronically Signed   By: Vinnie Langton M.D.   On: 03/07/2019 09:24   DG Chest Port 1 View  Result Date: 03/06/2019 CLINICAL DATA:  69 year old male status post intubation. EXAM: PORTABLE CHEST 1 VIEW COMPARISON:  Earlier radiograph dated 03/06/2019. FINDINGS: There has been interval retraction of the endotracheal tube with tip now approximately 5.5 cm above the carina. Bilateral chest tubes, enteric tube, Swan-Ganz catheter and cardiac valve repair as seen previously. Bibasilar atelectatic changes noted. There is a small right pleural effusion increased since the prior radiograph. No pneumothorax. No acute osseous pathology. IMPRESSION: 1. Interval retraction of the endotracheal tube with tip now approximately 5.5 cm above the carina. 2. Small right pleural effusion. Electronically Signed   By: Anner Crete M.D.   On: 03/06/2019 16:10   ECHOCARDIOGRAM COMPLETE  Result Date: 03/04/2019    ECHOCARDIOGRAM REPORT   Patient Name:   Gregory [name] [facility] Date of Exam: 03/04/2019 Medical Rec #:  [id number]         Height:       66.0 in Accession #:    [id number]        Weight:       264.6 lb Date of Birth:  15-Feb-1950         BSA:          2.252 m Patient Age:    8 years          BP:           143/52 mmHg Patient Gender: M                 HR:           73 bpm. Exam Location:  Inpatient Procedure: 2D Echo Indications:    Chest Pain 786.50 / R07.9  History:        Patient has prior history of Echocardiogram examinations, most                 recent 08/18/2018. Aortic Valve  Disease; Risk Factors:Prediabetes                 and Sleep Apnea. History of COVID 19. ETOH abuse. Chronic                 lymphedema,  chronic exertional dyspnea.  Sonographer:    Roseanna Rainbow RDCS Referring Phys: [id number] [place]  Sonographer Comments: Technically difficult study due to poor echo windows and patient is morbidly obese. Image acquisition challenging due to patient body habitus. IMPRESSIONS  1. Left ventricular ejection fraction, by estimation, is 45 to 50%. The left ventricle has mildly decreased function. The left ventricle demonstrates regional wall motion abnormalities, hypokinesis of the apical septal wall and the apex, hypokinesis of the basal inferior wall. There is moderate left ventricular hypertrophy. Left ventricular diastolic parameters are consistent with Grade I diastolic dysfunction (impaired relaxation).  2. Right ventricular systolic function is normal. The right ventricular size is normal. Tricuspid regurgitation signal is inadequate for assessing PA pressure.  3. Left atrial size was mildly dilated.  4. The mitral valve is normal in structure and function. No evidence of mitral valve regurgitation. No evidence of mitral stenosis.  5. The aortic valve is tricuspid. Aortic valve regurgitation is severe. Moderate aortic valve stenosis. Mean gradient across the aortic valve is 40 mmHg with AVA, however, calculated to 1.7 cm^2. I think that there is severe aortic insufficiency in the setting of a dilated aortic root with high flow from AI leading to elevated gradient across the aortic valve (probably no more than moderate stenosis).  6. Aortic dilatation noted. 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  7. The inferior vena cava is normal in size with <50% respiratory variability, suggesting right atrial pressure of 8 mmHg. FINDINGS  Left Ventricle: Left ventricular ejection fraction, by estimation, is 45 to 50%. The left ventricle has mildly decreased  function. The left ventricle demonstrates regional wall motion abnormalities. Definity contrast agent was given IV to delineate the left ventricular endocardial borders. The left ventricular internal cavity size was normal in size. There is moderate left ventricular hypertrophy. Left ventricular diastolic parameters are consistent with Grade I diastolic dysfunction (impaired relaxation). Right Ventricle: The right ventricular size is normal. No increase in right ventricular wall thickness. Right ventricular systolic function is normal. Tricuspid regurgitation signal is inadequate for assessing PA pressure. Left Atrium: Left atrial size was mildly dilated. Right Atrium: Right atrial size was normal in size. Pericardium: There is no evidence of pericardial effusion. Mitral Valve: The mitral valve is normal in structure and function. There is mild calcification of the mitral valve leaflet(s). Mild to moderate mitral annular calcification. No evidence of mitral valve regurgitation. No evidence of mitral valve stenosis. Tricuspid Valve: The tricuspid valve is normal in structure. Tricuspid valve regurgitation is not demonstrated. Aortic Valve: The aortic valve is tricuspid. Aortic valve regurgitation is severe. Aortic regurgitation PHT measures 228 msec. Moderate aortic stenosis is present. Aortic valve mean gradient measures 40.0 mmHg. Aortic valve peak gradient measures 62.0 mmHg. Aortic valve area, by VTI measures 1.86 cm. Pulmonic Valve: The pulmonic valve was normal in structure. Pulmonic valve regurgitation is not visualized. Aorta: Aortic dilatation noted. 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 Venous: The inferior vena cava is normal in size with less than 50% respiratory variability, suggesting right atrial pressure of 8 mmHg. IAS/Shunts: No atrial level shunt detected by color flow Doppler.  LEFT VENTRICLE PLAX 2D LVIDd:         5.40 cm      Diastology LVIDs:         3.80 cm      LV  e' lateral:   6.85 cm/s LV PW:  1.90 cm      LV E/e' lateral: 16.1 LV IVS:        1.80 cm      LV e' medial:    6.42 cm/s LVOT diam:     2.30 cm      LV E/e' medial:  17.1 LV SV:         182 LV SV Index:   81 LVOT Area:     4.15 cm  LV Volumes (MOD) LV vol d, MOD A2C: 236.0 ml LV vol d, MOD A4C: 262.0 ml LV vol s, MOD A2C: 140.0 ml LV vol s, MOD A4C: 143.0 ml LV SV MOD A2C:     96.0 ml LV SV MOD A4C:     262.0 ml LV SV MOD BP:      120.0 ml RIGHT VENTRICLE             IVC RV S prime:     11.30 cm/s  IVC diam: 2.00 cm TAPSE (M-mode): 2.5 cm LEFT ATRIUM             Index       RIGHT ATRIUM           Index LA diam:        4.00 cm 1.78 cm/m  RA Area:     20.20 cm LA Vol (A2C):   83.0 ml 36.85 ml/m RA Volume:   63.80 ml  28.33 ml/m LA Vol (A4C):   61.1 ml 27.13 ml/m LA Biplane Vol: 74.5 ml 33.08 ml/m  AORTIC VALVE AV Area (Vmax):    1.84 cm AV Area (Vmean):   1.84 cm AV Area (VTI):     1.86 cm AV Vmax:           393.80 cm/s AV Vmean:          282.800 cm/s AV VTI:            0.978 m AV Peak Grad:      62.0 mmHg AV Mean Grad:      40.0 mmHg LVOT Vmax:         174.00 cm/s LVOT Vmean:        125.000 cm/s LVOT VTI:          0.439 m LVOT/AV VTI ratio: 0.45 AI PHT:            228 msec  AORTA Ao Root diam: 4.60 cm Ao Asc diam:  4.40 cm MITRAL VALVE MV Area (PHT): 2.38 cm     SHUNTS MV Decel Time: 319 msec     Systemic VTI:  0.44 m MV E velocity: 110.10 cm/s  Systemic Diam: 2.30 cm MV A velocity: 154.00 cm/s MV E/A ratio:  0.71 Loralie Champagne MD Electronically signed by Loralie Champagne MD Signature Date/Time: 03/04/2019/5:51:01 PM    Final    ECHO INTRAOPERATIVE TEE  Result Date: 03/06/2019  *INTRAOPERATIVE TRANSESOPHAGEAL REPORT *  Patient Name:   Gregory [name] Date of Exam: 03/06/2019 Medical Rec #:  [id number]         Height:       67.0 in Accession #:    [id number]        Weight:       239.3 lb Date of Birth:  [birth year]         BSA:          2.18 m Patient Age:    6 years          BP:  120/48 mmHg  Patient Gender: M                 HR:           73 bpm. Exam Location:  Anesthesiology Transesophogeal exam was perform intraoperatively during surgical procedure. Patient was closely monitored under general anesthesia during the entirety of examination. Indications:     CABG. CAD Sonographer:     Jannett Celestine RDCS (AE) Performing Phys: [id number] BROADUS Z ATKINS Complications: No known complications during this procedure. POST-OP IMPRESSIONS - Left Ventricle: The left ventricle is unchanged from pre-bypass. - Aorta: The aorta appears unchanged from pre-bypass. - Aortic Valve: A homograft bioprosthetic valve was placed, leaflets are not freely mobile. Normal washing jets for valve type. - Mitral Valve: There is no regurgitation. PRE-OP FINDINGS  Left Ventricle: The left ventricle has low normal systolic function, with an ejection fraction of 50-55%. The cavity size was normal. There is no increase in left ventricular wall thickness. Right Ventricle: The right ventricle has normal systolic function. The cavity was normal. There is no increase in right ventricular wall thickness. Left Atrium: Left atrial size was normal in size. The left atrial appendage is well visualized and there is no evidence of thrombus present. Right Atrium: Right atrial size was normal in size. Right atrial pressure is estimated at 10 mmHg. Interatrial Septum: No atrial level shunt detected by color flow Doppler. Pericardium: There is no evidence of pericardial effusion. Mitral Valve: The mitral valve is normal in structure. No thickening of the mitral valve leaflet. No calcification of the mitral valve leaflet. Mitral valve regurgitation is mild by color flow Doppler. The MR jet is centrally-directed. Tricuspid Valve: The tricuspid valve was normal in structure. Tricuspid valve regurgitation was not visualized by color flow Doppler. Aortic Valve: The aortic valve is tricuspid There is severe thickening of the aortic valve and There is severe  calcifcation of the aortic valve Aortic valve regurgitation is severe by color flow Doppler. The jet is SAM present. There is moderate stenosis  of the aortic valve. Pulmonic Valve: The pulmonic valve was normal in structure. Pulmonic valve regurgitation is not visualized by color flow Doppler. Aorta: The aortic root, ascending aorta and aortic arch are normal in size and structure.  Lillia Abed MD Electronically signed by Lillia Abed MD Signature Date/Time: 03/06/2019/5:09:22 PM    Final    VAS US DOPPLER PRE CABG  Result Date: 03/05/2019 PREOPERATIVE VASCULAR EVALUATION  Indications:      Pre-CABG. Risk Factors:     Hypertension. Limitations:      Bilateral carotid high bifurcations and patient body habitus. Comparison Study: ABI 01-31-18. Performing Technologist: Baldwin Crown RVT, RDMS  Examination Guidelines: A complete evaluation includes B-mode imaging, spectral Doppler, color Doppler, and power Doppler as needed of all accessible portions of each vessel. Bilateral testing is considered an integral part of a complete examination. Limited examinations for reoccurring indications may be performed as noted.  Right Carotid Findings: +----------+--------+--------+--------+---------------------+------------------+           PSV cm/sEDV cm/sStenosisDescribe             Comments           +----------+--------+--------+--------+---------------------+------------------+ CCA Prox  176     21                                   tortuous and  bounding           +----------+--------+--------+--------+---------------------+------------------+ CCA Distal83      21                                                      +----------+--------+--------+--------+---------------------+------------------+ ICA Prox  101     17      1-39%   heterogenous and                                                          calcific                                 +----------+--------+--------+--------+---------------------+------------------+ ICA Distal89      13                                                      +----------+--------+--------+--------+---------------------+------------------+ ECA       140                     heterogenous                            +----------+--------+--------+--------+---------------------+------------------+ Portions of this table do not appear on this page. +----------+--------+-------+----------------+------------+           PSV cm/sEDV cmsDescribe        Arm Pressure +----------+--------+-------+----------------+------------+ [id number]            Multiphasic, [id number]          +----------+--------+-------+----------------+------------+ +---------+--------+--+--------+---------+ VertebralPSV cm/s58EDV cm/sAntegrade +---------+--------+--+--------+---------+ Left Carotid Findings: +----------+--------+--------+--------+-------------------------+--------+           PSV cm/sEDV cm/sStenosisDescribe                 Comments +----------+--------+--------+--------+-------------------------+--------+ CCA Prox  134     17                                                +----------+--------+--------+--------+-------------------------+--------+ CCA Distal106     16                                                +----------+--------+--------+--------+-------------------------+--------+ ICA Prox  99      10      1-39%   heterogenous and calcific         +----------+--------+--------+--------+-------------------------+--------+ ICA Distal144     24                                                +----------+--------+--------+--------+-------------------------+--------+ ECA  84      17              heterogenous                      +----------+--------+--------+--------+-------------------------+--------+  +----------+--------+--------+--------+------------+ SubclavianPSV cm/sEDV cm/sDescribeArm Pressure +----------+--------+--------+--------+------------+           240                                  +----------+--------+--------+--------+------------+ +---------+--------+---+--------+--+ VertebralPSV cm/s114EDV cm/s17 +---------+--------+---+--------+--+  ABI Findings: +--------+------------------+-----+---------+--------+ Right   Rt Pressure (mmHg)IndexWaveform Comment  +--------+------------------+-----+---------+--------+ [id number]                    triphasic         +--------+------------------+-----+---------+--------+ PTA     254               2.15 triphasic         +--------+------------------+-----+---------+--------+ DP      181               1.53 triphasic         +--------+------------------+-----+---------+--------+ +--------+------------------+-----+---------+-------------+ Left    Lt Pressure (mmHg)IndexWaveform Comment       +--------+------------------+-----+---------+-------------+ Brachial                       triphasicUTO due to IV +--------+------------------+-----+---------+-------------+ PTA     216               1.83 triphasic              +--------+------------------+-----+---------+-------------+ DP      190               1.61 triphasic              +--------+------------------+-----+---------+-------------+ +-------+---------------+----------------+ ABI/TBIToday's ABI/TBIPrevious ABI/TBI +-------+---------------+----------------+ Right  1.53                            +-------+---------------+----------------+ Left   1.83                            +-------+---------------+----------------+  Right Doppler Findings: +--------+--------+-----+---------+--------+ Site    PressureIndexDoppler  Comments +--------+--------+-----+---------+--------+ [id number]          triphasic          +--------+--------+-----+---------+--------+ Radial               triphasic         +--------+--------+-----+---------+--------+ Ulnar                triphasic         +--------+--------+-----+---------+--------+  Left Doppler Findings: +--------+--------+-----+---------+-------------+ Site    PressureIndexDoppler  Comments      +--------+--------+-----+---------+-------------+ Brachial             triphasicUTO due to IV +--------+--------+-----+---------+-------------+ Radial               triphasic              +--------+--------+-----+---------+-------------+ Ulnar                triphasic              +--------+--------+-----+---------+-------------+  Summary: Right Carotid: Velocities in the right ICA are consistent with a 1-39% stenosis. Left Carotid: Velocities in the left ICA are consistent with a 1-39%  stenosis. Vertebrals:  Bilateral vertebral arteries demonstrate antegrade flow. Subclavians: Normal flow hemodynamics were seen in bilateral subclavian              arteries. Right ABI: Resting right ankle-brachial index indicates noncompressible right lower extremity arteries. Left ABI: Resting left ankle-brachial index indicates noncompressible left lower extremity arteries. Right Upper Extremity: Doppler waveform obliterate with right radial compression. Doppler waveforms decrease <50% with right ulnar compression. Left Upper Extremity: Doppler waveform obliterate with left radial compression. Doppler waveforms decrease 50% with left ulnar compression.  Electronically signed by Servando Snare MD on 03/05/2019 at 4:09:10 PM.    Final     ASSESSMENT AND PLAN:  1.  Heparin-induced thrombocytopenia 2.  NSTEMI 3.  Hypertension 4.  Hyperlipidemia 5.  Obstructive sleep apnea 6.  Obesity  -Platelet count is slowly rising.  He remains on argatroban.  He is not actively bleeding.  Recommend transition to warfarin for anticoagulation.  I have placed a pharmacy consult for  warfarin management.  We will plan to recheck a CBC and PT/INR in our office next week.  He will need to remain on warfarin for at least 3 months.  Thank you for this referral.  Mikey Bussing, DNP, AGPCNP-BC, AOCNP  Attending Note  I personally saw the patient, reviewed the chart and examined the patient. The plan of care was discussed with the patient. I agree with the assessment and plan as documented above. Thank you very much for the consultation. - HIT: Because of thrombosis risk in patients with HIT, I recommend anticoagulation with Coumadin for the next 3 months. -Thrombocytopenia: We expect that the platelet count will slowly recover.  After much discussion about continuation of argatroban versus transitioning to Coumadin, we made a decision to transition him to Coumadin over the next 3 days as the platelet count slowly recover. -Appreciate the pharmacy for titrating the Coumadin dose while continuing argatroban.  The INR tends to be influenced by argatroban and therefore our goal while the patient is on argatroban would be to have an INR of 4 with Coumadin. -At that time argatroban can be discontinued and patient can remain on Coumadin with INR check keep INR between 2-3.

## 2019-03-19 NOTE — Plan of Care (Signed)
  Problem: Education: Goal: Knowledge of General Education information will improve Description: Including pain rating scale, medication(s)/side effects and non-pharmacologic comfort measures Outcome: Progressing   Problem: Education: Goal: Knowledge of General Education information will improve Description: Including pain rating scale, medication(s)/side effects and non-pharmacologic comfort measures Outcome: Progressing   Problem: Health Behavior/Discharge Planning: Goal: Ability to manage health-related needs will improve Outcome: Progressing   Problem: Clinical Measurements: Goal: Ability to maintain clinical measurements within normal limits will improve Outcome: Progressing Goal: Will remain free from infection Outcome: Progressing Goal: Diagnostic test results will improve Outcome: Progressing Goal: Respiratory complications will improve Outcome: Progressing Goal: Cardiovascular complication will be avoided Outcome: Progressing   Problem: Activity: Goal: Risk for activity intolerance will decrease Outcome: Progressing   Problem: Nutrition: Goal: Adequate nutrition will be maintained Outcome: Progressing   Problem: Coping: Goal: Level of anxiety will decrease Outcome: Progressing   Problem: Elimination: Goal: Will not experience complications related to bowel motility Outcome: Progressing Goal: Will not experience complications related to urinary retention Outcome: Progressing   Problem: Pain Managment: Goal: General experience of comfort will improve Outcome: Progressing   Problem: Safety: Goal: Ability to remain free from injury will improve Outcome: Progressing   Problem: Skin Integrity: Goal: Risk for impaired skin integrity will decrease Outcome: Progressing   Problem: Education: Goal: Understanding of cardiac disease, CV risk reduction, and recovery process will improve Outcome: Progressing Goal: Understanding of medication regimen will  improve Outcome: Progressing Goal: Individualized Educational Video(s) Outcome: Progressing   Problem: Cardiac: Goal: Ability to achieve and maintain adequate cardiopulmonary perfusion will improve Outcome: Progressing Goal: Vascular access site(s) Level 0-1 will be maintained Outcome: Progressing

## 2019-03-19 NOTE — Progress Notes (Signed)
Came to ambulate however pt feeling anxious. Asked for anxiety meds and a nap. Notified RN, encouraged him to ambulate with staff after nap. Encouraged IS and flutter. Yves Dill CES, ACSM 2:58 PM 03/19/2019 [id number]

## 2019-03-19 NOTE — Telephone Encounter (Signed)
Scheduled appt per 3/18 schmessage - unable to reach pt / left message with appt date and time

## 2019-03-20 DIAGNOSIS — I214 Non-ST elevation (NSTEMI) myocardial infarction: Secondary | ICD-10-CM | POA: Diagnosis not present

## 2019-03-20 LAB — CBC
HCT: 28 % — ABNORMAL LOW (ref 39.0–52.0)
Hemoglobin: 8.8 g/dL — ABNORMAL LOW (ref 13.0–17.0)
MCH: 28.5 pg (ref 26.0–34.0)
MCHC: 31.4 g/dL (ref 30.0–36.0)
MCV: 90.6 fL (ref 80.0–100.0)
Platelets: 32 10*3/uL — ABNORMAL LOW (ref 150–400)
RBC: 3.09 MIL/uL — ABNORMAL LOW (ref 4.22–5.81)
RDW: 16.9 % — ABNORMAL HIGH (ref 11.5–15.5)
WBC: 11 10*3/uL — ABNORMAL HIGH (ref 4.0–10.5)
nRBC: 0 % (ref 0.0–0.2)

## 2019-03-20 LAB — GLUCOSE, CAPILLARY: Glucose-Capillary: 89 mg/dL (ref 70–99)

## 2019-03-20 LAB — APTT: aPTT: 83 seconds — ABNORMAL HIGH (ref 24–36)

## 2019-03-20 LAB — PROTIME-INR
INR: 2.3 — ABNORMAL HIGH (ref 0.8–1.2)
Prothrombin Time: 25.4 seconds — ABNORMAL HIGH (ref 11.4–15.2)

## 2019-03-20 MED ORDER — ISOSORBIDE MONONITRATE ER 30 MG PO TB24: 30.0000 mg | ORAL_TABLET | Freq: Every day | ORAL | 0 refills | Status: DC

## 2019-03-20 MED ORDER — ATORVASTATIN CALCIUM 80 MG PO TABS: 80.0000 mg | ORAL_TABLET | Freq: Every day | ORAL | 3 refills | Status: DC

## 2019-03-20 MED ORDER — TRAMADOL HCL 50 MG PO TABS: 50.0000 mg | ORAL_TABLET | ORAL | 0 refills | Status: DC | PRN

## 2019-03-20 MED ORDER — METOPROLOL TARTRATE 25 MG PO TABS: 12.5000 mg | ORAL_TABLET | Freq: Two times a day (BID) | ORAL | 3 refills | Status: DC

## 2019-03-20 MED ORDER — MIDODRINE HCL 2.5 MG PO TABS: 2.5000 mg | ORAL_TABLET | Freq: Three times a day (TID) | ORAL | 1 refills | Status: DC

## 2019-03-20 MED ORDER — AMIODARONE HCL 200 MG PO TABS: 200.0000 mg | ORAL_TABLET | Freq: Every day | ORAL | 1 refills | Status: DC

## 2019-03-20 MED ORDER — WARFARIN SODIUM 2 MG PO TABS
2.0000 mg | ORAL_TABLET | Freq: Once | ORAL | Status: AC
  Administered 2019-03-20: 2 mg via ORAL
  Filled 2019-03-20: qty 1

## 2019-03-20 MED ORDER — WARFARIN SODIUM 2.5 MG PO TABS: 2.5000 mg | ORAL_TABLET | Freq: Every day | ORAL | 3 refills | Status: DC

## 2019-03-20 MED ORDER — COLCHICINE 0.6 MG PO TABS: 0.6000 mg | ORAL_TABLET | Freq: Two times a day (BID) | ORAL | 1 refills | Status: DC

## 2019-03-20 NOTE — Progress Notes (Signed)
Physical Therapy Treatment Patient Details Name: Gregory [name] MRN: [id number] DOB: Nov 24, 1950 Today's Date: 03/20/2019    History of Present Illness Pt is a 69 yo male s/p CABG x3, aortic valve replacement, radial artery harvest and TEE. PMHx: HTN, HLD, obstructive sleep apnea, chronic lymphedema in BLEs, obesity, back pain, ETOH use.    PT Comments    Patient seen for mobility progression with wife and daughter present and actively participating in session. Pt requires more assist for OOB mobility this session given L lateral bias/instability upon standing from EOB and with gait. Pt had more difficulty with stair training this session vs on 3/17. Pt requires mod A +2 due to bilat knee buckling and pt hanging onto rail and required assistance to achieve upright standing and balance to ascend second step. Pt presents with slow processing and poor recall of precautions. Wife reports pt is not at baseline cognition. This therapist and pt's family expressed concern about pt being able to safely negotiate stairs to enter home. CM notified of possible need for ambulance transport home however family decided to take pt home. Pt will continue to benefit from further skilled PT services to maximize independence and safety with mobility.     Follow Up Recommendations  Home health PT;Supervision for mobility/OOB     Equipment Recommendations  Other (comment)(rollator (delivered to room))    Recommendations for Other Services       Precautions / Restrictions Precautions Precautions: Fall;Sternal Precaution Booklet Issued: Yes (comment) Precaution Comments: reviewed sternal precautions with all tasks as pt continues to need cues; handout provided by OT    Mobility  Bed Mobility               General bed mobility comments: pt sitting EOB upon arrival with wife and daughter present  Transfers Overall transfer level: Needs assistance Equipment used: 4-wheeled walker Transfers: Sit  to/from Stand Sit to Stand: Min guard;Min assist         General transfer comment: cues for hand placement and technique; pt stood from EOB had posterior LOB and went back down onto bed   Ambulation/Gait Ambulation/Gait assistance: Min guard;Min assist Gait Distance (Feet): (200 ft with seated break) Assistive device: 4-wheeled walker Gait Pattern/deviations: Step-through pattern;Decreased stride length;Trendelenburg;Decreased step length - left;Decreased stance time - right;Decreased weight shift to right(L lateral bias ) Gait velocity: decreased   General Gait Details: pt with L Lateral bias and c/o R knee pain when asked otherwise seemingly unaware/could not explain leaning toward L side; assist to steady; seated rest break required after stair training   Stairs   Stairs assistance: Mod assist;+2 safety/equipment Stair Management: One rail Left;Step to pattern;Sideways Number of Stairs: 2 General stair comments: attempted to have pt practice with +2 HHA as family reports they will not have railing installed on stairs before pt is d/c today however pt requires more support and used rail; pt presents with R knee buckling when descendin steps and then when ascending with bilat Knee buckling and pt hanging onto rail and assist from therapist required to get back into standing and to ascend last step; pt then requested seated break   Wheelchair Mobility    Modified Rankin (Stroke Patients Only)       Balance Overall balance assessment: Needs assistance Sitting-balance support: No upper extremity supported;Feet supported Sitting balance-Leahy Scale: Good     Standing balance support: During functional activity Standing balance-Leahy Scale: Poor  Cognition Arousal/Alertness: Awake/alert Behavior During Therapy: WFL for tasks assessed/performed Overall Cognitive Status: Impaired/Different from baseline Area of Impairment: Following  commands;Safety/judgement;Problem solving;Memory                     Memory: Decreased recall of precautions Following Commands: Follows one step commands with increased time Safety/Judgement: Decreased awareness of safety;Decreased awareness of deficits   Problem Solving: Slow processing;Requires verbal cues;Requires tactile cues;Difficulty sequencing General Comments: wife present and reports pt is not at baseline cognition      Exercises      General Comments        Pertinent Vitals/Pain Pain Assessment: Faces Faces Pain Scale: Hurts a little bit Pain Location: incision, R knee Pain Descriptors / Indicators: Grimacing Pain Intervention(s): Limited activity within patient's tolerance;Monitored during session;Repositioned    Home Living                      Prior Function            PT Goals (current goals can now be found in the care plan section) Acute Rehab PT Goals Patient Stated Goal: home Progress towards PT goals: Progressing toward goals    Frequency    Min 3X/week      PT Plan Current plan remains appropriate    Co-evaluation              AM-PAC PT "6 Clicks" Mobility   Outcome Measure  Help needed turning from your back to your side while in a flat bed without using bedrails?: A Little Help needed moving from lying on your back to sitting on the side of a flat bed without using bedrails?: A Little Help needed moving to and from a bed to a chair (including a wheelchair)?: A Little Help needed standing up from a chair using your arms (e.g., wheelchair or bedside chair)?: A Little Help needed to walk in hospital room?: A Little Help needed climbing 3-5 steps with a railing? : A Lot 6 Click Score: 17    End of Session Equipment Utilized During Treatment: Gait belt Activity Tolerance: Patient tolerated treatment well Patient left: with call bell/phone within reach;with family/visitor present;in chair Nurse Communication:  Mobility status PT Visit Diagnosis: Unsteadiness on feet (R26.81)     Time: [id number] PT Time Calculation (min) (ACUTE ONLY): 24 min  Charges:  $Gait Training: 23-37 mins                     Earney Navy, PTA Acute Rehabilitation Services Pager: [phone] Office: [phone]     Darliss Cheney 03/20/2019, 2:58 PM

## 2019-03-20 NOTE — Progress Notes (Signed)
Discharged home accompanied by wife and daughter, discharge instructions given to pt. Belongings taken home. 

## 2019-03-20 NOTE — Progress Notes (Signed)
CARDIAC REHAB PHASE I   Discussed sternal precautions, IS, exercise, daily wts, diet, and CRPII with pt, wife and daughter. Receptive but anxious. Worried he is too weak. I had him walk with bathroom a few steps without rollator, pt very hesitant, seems to stem from anxiety. Needs verbal cues to process through and to give encouragement. Will refer to [place].  Pt is interested in participating in Virtual Cardiac and Pulmonary Rehab. Pt advised that Virtual Cardiac and Pulmonary Rehab is provided at no cost to the patient.  Checklist:  1. Pt has smart device  ie smartphone and/or ipad for downloading an app  Yes 2. Reliable internet/wifi service    Yes 3. Understands how to use their smartphone and navigate within an app.  Yes Pt verbalized understanding and is in agreement. [id number]  [place], ACSM 03/20/2019 11:49 AM

## 2019-03-20 NOTE — Progress Notes (Signed)
      [address]       [place] [address]             [phone]      14 Days Post-Op Procedure(s) (LRB): CORONARY ARTERY BYPASS GRAFTING (CABG), ON PUMP, TIMES THREE, USING BILATERAL INTERNAL MAMMARIES AND LEFT RADIAL ARTERY HARVEST (N/A) AORTIC VALVE REPLACEMENT (AVR), USING INTUITY 25MM (N/A) RADIAL ARTERY HARVEST (Left) TRANSESOPHAGEAL ECHOCARDIOGRAM (TEE) (N/A) INDOCYANINE GREEN FLUORESCENCE IMAGING (ICG) (N/A)   Subjective:  Patient has no complaints.  He is very happy to be going home today.  Objective: Vital signs in last 24 hours: Temp:  [97.6 F (36.4 C)-98 F (36.7 C)] 97.9 F (36.6 C) (03/19 0307) Pulse Rate:  [72-81] 72 (03/18 2347) Cardiac Rhythm: Normal sinus rhythm (03/19 0708) Resp:  [15-20] 15 (03/19 0307) BP: (100-125)/(68-77) 108/69 (03/19 0307) SpO2:  [92 %-98 %] 93 % (03/19 0307) Weight:  [100.7 kg] 100.7 kg (03/19 0307)  Intake/Output from previous day: 03/18 0701 - 03/19 0700 In: 797 [P.O.:720; I.V.:77] Out: 500 [Urine:500]  General appearance: alert, cooperative and no distress Heart: regular rate and rhythm Lungs: clear to auscultation bilaterally Abdomen: soft, non-tender; bowel sounds normal; no masses,  no organomegaly Extremities: edema trace Wound: clean and dry  Lab Results: Recent Labs    03/19/19 0228 03/20/19 0213  WBC 12.2* 11.0*  HGB 9.0* 8.8*  HCT 28.1* 28.0*  PLT 26* 32*   BMET: No results for input(s): NA, K, CL, CO2, GLUCOSE, BUN, CREATININE, CALCIUM in the last 72 hours.  PT/INR:  Recent Labs    03/20/19 0213  LABPROT 25.4*  INR 2.3*   ABG    Component Value Date/Time   PHART 7.329 (L) 03/07/2019 0639   HCO3 22.3 03/07/2019 0639   TCO2 30 03/08/2019 2319   ACIDBASEDEF 3.0 (H) 03/07/2019 0639   O2SAT 91.0 03/07/2019 0639   CBG (last 3)  Recent Labs    03/19/19 1630 03/19/19 2158 03/20/19 0559  GLUCAP 116* 104* 89    Assessment/Plan: S/P Procedure(s) (LRB): CORONARY ARTERY  BYPASS GRAFTING (CABG), ON PUMP, TIMES THREE, USING BILATERAL INTERNAL MAMMARIES AND LEFT RADIAL ARTERY HARVEST (N/A) AORTIC VALVE REPLACEMENT (AVR), USING INTUITY 25MM (N/A) RADIAL ARTERY HARVEST (Left) TRANSESOPHAGEAL ECHOCARDIOGRAM (TEE) (N/A) INDOCYANINE GREEN FLUORESCENCE IMAGING (ICG) (N/A)  1. CV- NSR, BP slowly improving- continue Lopressor, Amiodarone, Midodrine 2. INR 2.3, continue coumadin will plan to have [facility] on Monday 3. HIT- appreciated hematology assistance, on coumadin will f/u with their office on Wednesday, platelet count up to 32 4. Dispo- patient stable, his INR is at 2.3 on coumadin for HIT plan to check PT/INR on Monday, his platelet count is slowly improving, maintaining NSR, BP slowly improving can hopefully d/c Midodrine as an outpatient.[name] as discussed with Dr. Orvan Seen will d/c home today   LOS: 16 days    Ellwood Handler, PA-C  03/20/2019

## 2019-03-20 NOTE — Care Management Important Message (Signed)
Important Message  Patient Details  Name: Gregory [name] MRN: [id number] Date of Birth: 1950-03-25   Medicare Important Message Given:  Yes     [name] 03/20/2019, 1:16 PM

## 2019-03-20 NOTE — Progress Notes (Signed)
[place] for argatroban >warfarin Indication: R/o HIT  Allergies  Allergen Reactions  . Heparin     HIT antibody and SRA positive    Patient Measurements: Height: 5\' 7"  (170.2 cm) Weight: 222 lb 0.1 oz (100.7 kg)(scale b) IBW/kg (Calculated) : 66.1  Vital Signs: Temp: 97.9 F (36.6 C) (03/19 0307) Temp Source: Oral (03/19 0307) BP: 108/69 (03/19 0307) Pulse Rate: 72 (03/18 2347)  Labs: Recent Labs    03/18/19 0225 03/18/19 0225 03/19/19 0228 03/20/19 0213  HGB 8.7*   < > 9.0* 8.8*  HCT 27.4*  --  28.1* 28.0*  PLT 25*  --  26* 32*  APTT 79*  --  81* 83*  LABPROT  --   --   --  25.4*  INR  --   --   --  2.3*   < > = values in this interval not displayed.    Estimated Creatinine Clearance: 69.4 mL/min (by C-G formula based on SCr of 1.12 mg/dL).   Medical History: Past Medical History:  Diagnosis Date  . Alcohol abuse   . Aortic valve disease    AI and AS, Moderate  . Chest pain 03/2019  . Drug use   . Edema of both lower extremities   . Glaucoma   . Hypertension   . OSA (obstructive sleep apnea)   . Shingles     Medications:  Infusions:  . sodium chloride    . amiodarone    . argatroban 0.5 mcg/kg/min (03/20/19 0300)    Assessment: 69 yo male admitted with ACS, now s/p CABG with dropping platelet count.  Baseline 227.  Heparin exposure 3/3-3/5, platelets dropped to 146 on 3/6, but then increased to 150 on 3/9.  As of 3/10 have continued to trend down - nadir 17.  Pharmacy dosing argatroban per  HIT protocol.  HIT antibody test from 3/12 is positive, SRA also positive. Updated allergy to be HIT +  APTT this morning 83sec remains in goal range on argatroban drip rate 0.82mcg/kg/min.   No s/sx of bleeding. Hgb stable 8-9 plt increasing slightly to 32. Per Heme-Onc start warfarin today despite on going low pltc.   Will start low Argatroban falsely elevates INR so will plan to continue argatroban until INR > 4 -  will then turn argatroban off and recheck INR to ensure therapeutic.   Goal of Therapy:  aPTT 50-90 seconds  INR > 4 on argatroban, 2 when off Monitor platelets by anticoagulation protocol: Yes   Plan:  1. Continue argatroban at 0.5 mcg/kg/min. 2. Warfarin 2mg  x1 today -repeat 3. Daily aPTT and CBC, INR.   Bonnita Nasuti Pharm.D. CPP, BCPS Clinical Pharmacist [phone] 03/20/2019 7:38 AM    Please check AMION for all [place] phone numbers After 10:00 PM, call [place] [phone]  03/20/2019 7:38 AM

## 2019-03-20 NOTE — TOC Transition Note (Signed)
Transition of Care [facility]) - CM/SW Discharge Note   Patient Details  Name: Gregory [name] MRN: [id number] Date of Birth: Jul 05, 1950  [facility] CM/SW Contact:  Zenon Mayo, RN Phone Number: 03/20/2019, 9:33 AM   Clinical Narrative:    Patient is for dc today, NCM notified [place] with [facility] to add [facility] to services, she states she can add , patient now with [place] and HHPT.  He has rollator in the room.  He will get the shower chair from [place].  He has no other needs.   Final next level of care: [place] Barriers to Discharge: No Barriers Identified   Patient Goals and CMS Choice Patient states their goals for this hospitalization and ongoing recovery are:: get better CMS Medicare.gov Compare Post Acute Care list provided to:: Patient Choice offered to / list presented to : Patient  Discharge Placement                       Discharge Plan and Services In-house Referral: NA Discharge Planning Services: CM Consult Post Acute Care Choice: Durable Medical Equipment, Home Health          DME Arranged: Walker rolling with seat DME Agency: [employer] Date DME Agency Contacted: 03/16/19 Time DME Agency Contacted: 1206 Representative spoke with at DME Agency: Thedore Mins HH Arranged: RN, PT [place] Agency: [facility] [place] Date [place]: 03/16/19 Time [place]: 1207 Representative spoke with at [place]: [employer] ([place]) Interventions     Readmission Risk Interventions No flowsheet data found.

## 2019-03-20 NOTE — Discharge Instructions (Signed)
Discharge Instructions:  1. You may shower, please wash incisions daily with soap and water and keep dry.  If you wish to cover wounds with dressing you may do so but please keep clean and change daily.  No tub baths or swimming until incisions have completely healed.  If your incisions become red or develop any drainage please call our office at [phone]  2. No Driving until cleared by Dr. Orvan Seen' office and you are no longer using narcotic pain medications  3. Monitor your weight daily.. Please use the same scale and weigh at same time... If you gain 5-10 lbs in 48 hours with associated lower extremity swelling, please contact our office at [phone]  4. Fever of 101.5 for at least 24 hours with no source, please contact our office at [phone]  5. Activity- up as tolerated, please walk at least 3 times per day.  Avoid strenuous activity, no lifting, pushing, or pulling with your arms over 8-10 lbs for a minimum of 6 weeks  6. If any questions or concerns arise, please do not hesitate to contact our office at [phone]       Bleeding Precautions When on Anticoagulant Therapy, Adult Anticoagulant therapy, also called blood thinner therapy, is medicine that helps to prevent and treat blood clots. The medicine works by stopping blood clots from forming or growing. Blood clots that form in your blood vessels can be dangerous. They can break loose and travel to the heart, lungs, or brain. This increases the risk of a heart attack, stroke, or blocked lung artery (pulmonary embolism). Anticoagulants also increase the risk of bleeding. Try to protect yourself from cuts and other injuries that can cause bleeding. It is important to take anticoagulants exactly as told by your health care provider. Why do I need to be on anticoagulant therapy? You may need this medicine if you are at risk of developing a blood clot. Conditions that increase your risk of a blood clot include:  Being born  with heart disease or a heart malformation (congenital heart disease).  Developing heart disease.  Having had surgery, such as valve replacement.  Having had a serious accident or other type of severe injury (trauma).  Having certain types of cancer.  Having certain diseases that can increase blood clotting.  Having a high risk of stroke or heart attack.  Having atrial fibrillation (AF). What are the common anticoagulant medicines? There are several types of anticoagulant medicines. The most common types are:  Medicines that you take by mouth (oral medicines), such as: ? Warfarin. ? Novel oral anticoagulants (NOACs), such as:  Direct thrombin inhibitors (dabigatran).  Factor Xa inhibitors (apixaban, edoxaban, and rivaroxaban).  Injections, such as: ? Unfractionated heparin. ? Low molecular weight heparin. These anticoagulants work in different ways to prevent blood clots. They also have different risks and side effects. What do I need to remember while on anticoagulant therapy? Taking anticoagulants  Take your medicine at the same time every day. If you forget to take your medicine, take it as soon as you remember. Do not double your dosage of medicine if you miss a whole day. Take your normal dose and call your health care provider.  Do not stop taking your medicine unless your health care provider approves. Stopping the medicine can increase your risk of developing a blood clot. Taking other medicines  Take over-the-counter and prescriptions medicines only as told by your health care provider.  Do not take over-the-counter NSAIDs, including aspirin and ibuprofen, while  you are on anticoagulant therapy. These medicines increase your risk of dangerous bleeding.  Get approval from your health care provider before you start taking any new medicines, vitamins, or herbal products. Some of these could interfere with your therapy. General instructions  Keep all follow-up visits  as told by your health care provider. This is important.  If you are pregnant or trying to get pregnant, talk with a health care provider about anticoagulants. Some of these medicines are not safe to take during pregnancy.  Tell all health care providers, including your dentist, that you are on anticoagulant therapy. It is especially important to tell providers before you have any surgery, medical procedures, or dental work done. What precautions should I take?   Be very careful when using knives, scissors, or other sharp objects.  Use an electric razor instead of a blade.  Do not use toothpicks.  Use a soft-bristled toothbrush. Brush your teeth gently.  Always wear shoes outdoors and wear slippers indoors.  Be careful when cutting your fingernails and toenails.  Place bath mats in the bathroom. If possible, install handrails as well.  Wear gloves while you do yard work.  Wear your seat belt.  Prevent falls by removing loose rugs and extension cords from areas where you walk. Use a cane or walker if you need it.  Avoid constipation by: ? Drinking enough fluid to keep your urine clear or pale yellow. ? Eating foods that are high in fiber, such as fresh fruits and vegetables, whole grains, and beans. ? Limiting foods that are high in fat and processed sugars, such as fried and sweet foods.  Do not play contact sports or participate in other activities that have a high risk for injury. What other precautions are important if on warfarin therapy? If you are taking a type of anticoagulant called warfarin, make sure you:  Work with a diet and nutrition specialist (dietitian) to make an eating plan. Do not make any sudden changes to your diet after you have started your eating plan.  Do not drink alcohol. It can interfere with your medicine and increase your risk of an injury that causes bleeding.  Get regular blood tests as told by your health care provider. What are some  questions to ask my health care provider?  Why do I need anticoagulant therapy?  What is the best anticoagulant therapy for my condition?  How long will I need anticoagulant therapy?  What are the side effects of anticoagulant therapy?  When should I take my medicine? What should I do if I forget to take it?  Will I need to have regular blood tests?  Do I need to change my diet? Are there foods or drinks that I should avoid?  What activities are safe for me?  What should I do if I want to get pregnant? Contact a health care provider if:  You miss a dose of medicine: ? And you are not sure what to do. ? For more than one day.  You have: ? Menstrual bleeding that is heavier than normal. ? Bloody or brown urine. ? Easy bruising. ? Black and tarry stool or bright red stool. ? Side effects from your medicine.  You feel weak or dizzy.  You become pregnant. Get help right away if:  You have bleeding that will not stop within 20 minutes from: ? The nose. ? The gums. ? A cut on the skin.  You have a severe headache or stomachache.  You  vomit or cough up blood.  You fall or hit your head. Summary  Anticoagulant therapy, also called blood thinner therapy, is medicine that helps to prevent and treat blood clots.  Anticoagulants work in different ways to prevent blood clots. They also have different risks and side effects.  Talk with your health care provider about any precautions that you should take while on anticoagulant therapy. This information is not intended to replace advice given to you by your health care provider. Make sure you discuss any questions you have with your health care provider. Document Revised: 04/09/2018 Document Reviewed: 03/06/2016 Elsevier Patient Education  [place] and Warfarin Warfarin is a blood thinner (anticoagulant). Anticoagulant medicines help prevent the formation of blood clots. These medicines work  by decreasing the activity of vitamin K, which promotes normal blood clotting. When you take warfarin, problems can occur from suddenly increasing or decreasing the amount of vitamin K that you eat from one day to the next. Problems may include:  Blood clots.  Bleeding. What general guidelines do I need to follow? To avoid problems when taking warfarin:  Eat a balanced diet that includes: ? Fresh fruits and vegetables. ? Whole grains. ? Low-fat dairy products. ? Lean proteins, such as fish, eggs, and lean cuts of meat.  Keep your intake of vitamin K consistent from day to day. To do this: ? Avoid eating large amounts of vitamin K one day and low amounts of vitamin K the next day. ? If you take a multivitamin that contains vitamin K, be sure to take it every day. ? Know which foods contain vitamin K. Use the lists below to understand serving sizes and the amount of vitamin K in one serving.  Avoid major changes in your diet. If you are going to change your diet, talk with your health care provider before making changes.  Work with a Financial planner (dietitian) to develop a meal plan that works best for you.  High vitamin K foods Foods that are high in vitamin K contain more than 100 mcg (micrograms) per serving. These include:  Broccoli (cooked) -  cup has 110 mcg.  Brussels sprouts (cooked) -  cup has 109 mcg.  Greens, beet (cooked) -  cup has 350 mcg.  Greens, collard (cooked) -  cup has 418 mcg.  Greens, turnip (cooked) -  cup has 265 mcg.  Green onions or scallions -  cup has 105 mcg.  Kale (fresh or frozen) -  cup has 531 mcg.  Parsley (raw) - 10 sprigs has 164 mcg.  Spinach (cooked) -  cup has 444 mcg.  Swiss chard (cooked) -  cup has 287 mcg. Moderate vitamin K foods Foods that have a moderate amount of vitamin K contain 25-100 mcg per serving. These include:  Asparagus (cooked) - 5 spears have 38 mcg.  Black-eyed peas (dried) -  cup has 32  mcg.  Cabbage (cooked) -  cup has 37 mcg.  Kiwi fruit - 1 medium has 31 mcg.  Lettuce - 1 cup has 57-63 mcg.  Okra (frozen) -  cup has 44 mcg.  Prunes (dried) - 5 prunes have 25 mcg.  Watercress (raw) - 1 cup has 85 mcg. Low vitamin K foods Foods low in vitamin K contain less than 25 mcg per serving. These include:  Artichoke - 1 medium has 18 mcg.  Avocado - 1 oz. has 6 mcg.  Blueberries -  cup has 14 mcg.  Cabbage (raw) -  cup has 21 mcg.  Carrots (cooked) -  cup has 11 mcg.  Cauliflower (raw) -  cup has 11 mcg.  Cucumber with peel (raw) -  cup has 9 mcg.  Grapes -  cup has 12 mcg.  Mango - 1 medium has 9 mcg.  Nuts - 1 oz. has 15 mcg.  Pear - 1 medium has 8 mcg.  Peas (cooked) -  cup has 19 mcg.  Pickles - 1 spear has 14 mcg.  Pumpkin seeds - 1 oz. has 13 mcg.  Sauerkraut (canned) -  cup has 16 mcg.  Soybeans (cooked) -  cup has 16 mcg.  Tomato (raw) - 1 medium has 10 mcg.  Tomato sauce -  cup has 17 mcg. Vitamin K-free foods If a food contain less than 5 mcg per serving, it is considered to have no vitamin K. These foods include:  Bread and cereal products.  Cheese.  Eggs.  Fish and shellfish.  Meat and poultry.  Milk and dairy products.  Sunflower seeds. Actual amounts of vitamin K in foods may be different depending on processing. Talk with your dietitian about what foods you can eat and what foods you should avoid. This information is not intended to replace advice given to you by your health care provider. Make sure you discuss any questions you have with your health care provider. Document Revised: 11/30/2016 Document Reviewed: 03/23/2015 Elsevier Patient Education  2020 [employer].

## 2019-03-20 NOTE — Progress Notes (Addendum)
Occupational Therapy Treatment Patient Details Name: Gregory [name] MRN: [id number] DOB: 25-Sep-1950 Today's Date: 03/20/2019    History of present illness Pt is a 69 yo male s/p CABG x3, aortic valve replacement, radial artery harvest and TEE. PMHx: HTN, HLD, obstructive sleep apnea, chronic lymphedema in BLEs, obesity, back pain, ETOH use.   OT comments  Pt with plan to d/c home this date, focused session on BADL and safety for home. Wife and daughter present for session. Gave pt and family printout for sternal precautions and reviewed and practiced. Pt continues to need cues to consistently follow sternal precautions with transfers. Pt has BSC and rollator for home; daughter has just purchased shower chair. Wife will be home 24/7 and pt and wife prefer to assist with LB ADL assistance. Reviewed ECS strategies as well. Anticipate d/c this date with HHOT remaining appropriate, but will continue to follow as acute to progress BADL tolerance.   Follow Up Recommendations  Home health OT;Supervision/Assistance - 24 hour    Equipment Recommendations  Other (comment)(has BSC and rollator for home)    Recommendations for Other Services      Precautions / Restrictions Precautions Precautions: Fall;Sternal Precaution Booklet Issued: Yes (comment) Precaution Comments: reviewed sternal precautions, continues to need cues, gave handout to wife and daughter present Restrictions Weight Bearing Restrictions: Yes RUE Weight Bearing: Non weight bearing LUE Weight Bearing: Non weight bearing       Mobility Bed Mobility               General bed mobility comments: sitting EOB on arrival  Transfers Overall transfer level: Needs assistance Equipment used: 4-wheeled walker Transfers: Sit to/from Stand Sit to Stand: Min guard         General transfer comment: continued cues for sternal precautions and to place hands on knees and use momentum to stand    Balance Overall balance  assessment: Needs assistance Sitting-balance support: No upper extremity supported;Feet supported Sitting balance-Leahy Scale: Good     Standing balance support: During functional activity Standing balance-Leahy Scale: Poor Standing balance comment: reliant on external support                           ADL either performed or assessed with clinical judgement   ADL Overall ADL's : Needs assistance/impaired                     Lower Body Dressing: Maximal assistance;Sit to/from stand Lower Body Dressing Details (indicate cue type and reason): to don boxers and pants, prefers wife to assist Toilet Transfer: Min guard;Ambulation;BSC;RW Toilet Transfer Details (indicate cue type and reason): needing momentum to engage in transfer while following sternal precautions         Functional mobility during ADLs: Min guard;Rolling walker;Cueing for safety General ADL Comments: focused session on BADL and safey for d/c     Vision Baseline Vision/History: Wears glasses Wears Glasses: At all times Patient Visual Report: No change from baseline     Perception     Praxis      Cognition Arousal/Alertness: Awake/alert Behavior During Therapy: WFL for tasks assessed/performed Overall Cognitive Status: Within Functional Limits for tasks assessed                                          Exercises     Shoulder Instructions  General Comments      Pertinent Vitals/ Pain       Pain Assessment: Faces Faces Pain Scale: Hurts a little bit Pain Location: incision, R knee Pain Descriptors / Indicators: Grimacing Pain Intervention(s): Monitored during session  Home Living                                          Prior Functioning/Environment              Frequency  Min 3X/week        Progress Toward Goals  OT Goals(current goals can now be found in the care plan section)  Progress towards OT goals: Progressing  toward goals  Acute Rehab OT Goals Patient Stated Goal: home OT Goal Formulation: With patient Time For Goal Achievement: 03/25/19 Potential to Achieve Goals: Good  Plan Discharge plan remains appropriate    Co-evaluation                 AM-PAC OT "6 Clicks" Daily Activity     Outcome Measure   Help from another person eating meals?: None Help from another person taking care of personal grooming?: A Little Help from another person toileting, which includes using toliet, bedpan, or urinal?: A Little Help from another person bathing (including washing, rinsing, drying)?: A Lot Help from another person to put on and taking off regular upper body clothing?: A Little Help from another person to put on and taking off regular lower body clothing?: A Lot 6 Click Score: 17    End of Session Equipment Utilized During Treatment: Rolling walker  OT Visit Diagnosis: Unsteadiness on feet (R26.81);Muscle weakness (generalized) (M62.81)   Activity Tolerance Patient tolerated treatment well   Patient Left Other (comment)(walking out in hall with PT)   Nurse Communication Mobility status        Time: [id number] OT Time Calculation (min): 19 min  Charges: OT General Charges $OT Visit: 1 Visit OT Treatments $Self Care/Home Management : 8-22 mins  Zenovia Jarred, MSOT, OTR/L [facility] Office Number: [phone] Pager: [phone]  Zenovia Jarred 03/20/2019, 10:53 AM

## 2019-03-23 ENCOUNTER — Encounter: Payer: Self-pay | Admitting: Pharmacist

## 2019-03-23 ENCOUNTER — Ambulatory Visit (INDEPENDENT_AMBULATORY_CARE_PROVIDER_SITE_OTHER): Payer: Medicare HMO | Admitting: Pharmacist

## 2019-03-23 ENCOUNTER — Other Ambulatory Visit: Payer: Self-pay

## 2019-03-23 ENCOUNTER — Telehealth: Payer: Self-pay | Admitting: *Deleted

## 2019-03-23 ENCOUNTER — Other Ambulatory Visit: Payer: Self-pay | Admitting: Family Medicine

## 2019-03-23 DIAGNOSIS — Z952 Presence of prosthetic heart valve: Secondary | ICD-10-CM

## 2019-03-23 DIAGNOSIS — D7582 Heparin induced thrombocytopenia (HIT): Secondary | ICD-10-CM

## 2019-03-23 DIAGNOSIS — I4891 Unspecified atrial fibrillation: Secondary | ICD-10-CM

## 2019-03-23 DIAGNOSIS — Z862 Personal history of diseases of the blood and blood-forming organs and certain disorders involving the immune mechanism: Secondary | ICD-10-CM | POA: Insufficient documentation

## 2019-03-23 DIAGNOSIS — D75829 Heparin-induced thrombocytopenia, unspecified: Secondary | ICD-10-CM

## 2019-03-23 DIAGNOSIS — Z7901 Long term (current) use of anticoagulants: Secondary | ICD-10-CM | POA: Diagnosis not present

## 2019-03-23 LAB — POCT INR: INR: 2 (ref 2.0–3.0)

## 2019-03-23 NOTE — Telephone Encounter (Signed)
I called the [facility] to see if they had a response to their call on Saturday to the ON CALL MD for our practice.  Mr. Plymouth was having sever diarrhea.  Mrs. Hardie Pulley that Dr. Kipp Brood had called and recommended Immodium.  It has checked the diarrhea, but it hasn't stopped completely.  He has had another episode this morning.  I consulted with D      With Dr. Orvan Seen and he recommended stopping the colchicine.  I relayed this to Mrs. Osmond and she understod..d.  We also covered sleep remedies such as Benadryl and Melatonin.Marland KitchenMarland KitchenShe asked if he could use Xanax for anxiety like he was given in the hospital.  His PCP has ordered this for him before.  I suggested he contact his PCP.  They agreed.  I said to call us back if the diarrhea persisted after the colchicine was stopped.

## 2019-03-23 NOTE — Telephone Encounter (Signed)
Last office visit 01/23/2019 for acute left sidded low back pain w/o sciatica.  Last refilled 11/11/2018 for #20 with no refills.  No future appointments with PCP.

## 2019-03-25 ENCOUNTER — Telehealth (HOSPITAL_COMMUNITY): Payer: Self-pay

## 2019-03-25 NOTE — Progress Notes (Signed)
Patient Care Team: Jinny Sanders, MD as PCP - General (Family Medicine) [name], MD as PCP - Cardiology (Cardiology)  DIAGNOSIS:    ICD-10-CM   1. HIT (heparin-induced thrombocytopenia) (HCC)  D75.82     CHIEF COMPLIANT: Follow-up of recent hospitalization  INTERVAL HISTORY: Gregory [name] is a 69 y.o. with above-mentioned history of Heparin-induced thrombocytopenia who is currently on anticoagulation with Coumadin. He presents to the clinic today for follow-up of his hospitalization.  He was in the hospital with the nausea and vomiting and was diagnosed with NSTEMI status post CABG on 03/06/2019.  Postoperatively he went on heparin drip.  His platelets which were normal on admission went down to 17,000.  HIT antibody panel was positive.  Heparin was stopped and we were consulted.  Recommended Coumadin treatment.  He has been on Coumadin being managed by Coumadin clinic. He is here today to recheck on his platelet count and to see if it is recovering.  He continues to have easy bruising but no active bleeding.  His energy levels are slowly improving and his appetite and strength are getting better.  ALLERGIES:  is allergic to heparin.  MEDICATIONS:  Current Outpatient Medications  Medication Sig Dispense Refill  . ALPRAZolam (XANAX) 0.5 MG tablet TAKE 1 TABLET BY MOUTH ONCE DAILY AS NEEDED FOR ANXIETY (TAKE  30  MINUTES  PRIOR  TO  PLANE  FLIGHT  OR  AS  NEEDED  FOR  ANXIETY) 20 tablet 0  . amiodarone (PACERONE) 200 MG tablet Take 1 tablet (200 mg total) by mouth daily. 30 tablet 1  . atorvastatin (LIPITOR) 80 MG tablet Take 1 tablet (80 mg total) by mouth daily. 30 tablet 3  . cyclobenzaprine (FLEXERIL) 10 MG tablet Take 1 tablet (10 mg total) by mouth at bedtime as needed for muscle spasms. (Patient not taking: Reported on 03/04/2019) 15 tablet 0  . furosemide (LASIX) 40 MG tablet Take 1 tablet by mouth once daily (Patient taking differently: Take 40 mg by mouth daily. ) 90  tablet 0  . isosorbide mononitrate (IMDUR) 30 MG 24 hr tablet Take 1 tablet (30 mg total) by mouth daily. 30 tablet 0  . latanoprost (XALATAN) 0.005 % ophthalmic solution Place 1 drop into both eyes every morning.     . metoprolol tartrate (LOPRESSOR) 25 MG tablet Take 0.5 tablets (12.5 mg total) by mouth 2 (two) times daily. 30 tablet 3  . midodrine (PROAMATINE) 2.5 MG tablet Take 1 tablet (2.5 mg total) by mouth 3 (three) times daily with meals. 90 tablet 1  . Misc Natural Products (OSTEO BI-FLEX/5-LOXIN ADVANCED PO) Take 2 tablets by mouth daily.     . potassium chloride SA (KLOR-CON) 20 MEQ tablet Take 1 tablet by mouth once daily (Patient taking differently: Take 20 mEq by mouth daily. ) 90 tablet 1  . traMADol (ULTRAM) 50 MG tablet Take 1-2 tablets (50-100 mg total) by mouth every 4 (four) hours as needed for moderate pain. 30 tablet 0  . warfarin (COUMADIN) 2.5 MG tablet Take 1 tablet (2.5 mg total) by mouth daily. 30 tablet 3   No current facility-administered medications for this visit.    PHYSICAL EXAMINATION: ECOG PERFORMANCE STATUS: 1 - Symptomatic but completely ambulatory  Vitals:   03/26/19 1500 03/26/19 1517  BP: 127/83 104/69  Pulse: 76   Resp: 17   Temp: 98.2 F (36.8 C)   SpO2: 98%    Filed Weights   03/26/19 1500  Weight: 220 lb 11.2  oz (100.1 kg)    LABORATORY DATA:  I have reviewed the data as listed CMP Latest Ref Rng & Units 03/17/2019 03/16/2019 03/15/2019  Glucose 70 - 99 mg/dL 106(H) 96 108(H)  BUN 8 - 23 mg/dL 29(H) 25(H) 21  Creatinine 0.61 - 1.24 mg/dL 1.12 1.03 0.97  Sodium 135 - 145 mmol/L 136 139 138  Potassium 3.5 - 5.1 mmol/L 4.3 4.1 4.5  Chloride 98 - 111 mmol/L 92(L) 94(L) 94(L)  CO2 22 - 32 mmol/L 31 34(H) 32  Calcium 8.9 - 10.3 mg/dL 8.1(L) 8.2(L) 8.2(L)  Total Protein 6.0 - 8.5 g/dL - - -  Total Bilirubin 0.0 - 1.2 mg/dL - - -  Alkaline Phos 39 - 117 IU/L - - -  AST 0 - 40 IU/L - - -  ALT 0 - 44 IU/L - - -    Lab Results  Component  Value Date   WBC 7.7 03/26/2019   HGB 10.5 (L) 03/26/2019   HCT 32.8 (L) 03/26/2019   MCV 88.4 03/26/2019   PLT 31 (L) 03/26/2019   NEUTROABS 4.5 03/26/2019    ASSESSMENT & PLAN:  HIT (heparin-induced thrombocytopenia) (HCC) HIT: Because of thrombosis risk in patients with HIT, I recommend anticoagulation with Coumadin for the next 3 months. Thrombocytopenia: We expect that the platelet count will slowly recover on coumadin. Lab review: Platelets 31, hemoglobin improved to 10.5 I discussed with the patient and his wife that it might take several weeks for the thrombocytopenia to get better. We can continue to watch and monitor as long as he does not have active bleeding. Because of his risk of thrombosis I recommended continuation of Coumadin therapy for at least the next 3 months. Return to clinic in 2 weeks with labs and follow-up.  No orders of the defined types were placed in this encounter.  The patient has a good understanding of the overall plan. he agrees with it. he will call with any problems that may develop before the next visit here.  Total time spent: 30 mins including face to face time and time spent for planning, charting and coordination of care  Nicholas Lose, MD 03/26/2019  I, Cloyde Reams Dorshimer, am acting as scribe for Dr. Nicholas Lose.  I have reviewed the above documentation for accuracy and completeness, and I agree with the above.

## 2019-03-25 NOTE — Telephone Encounter (Signed)
Attempted to call patient in regards to Cardiac Rehab - LM on VM 

## 2019-03-25 NOTE — Telephone Encounter (Signed)
Pt insurance is active and benefits verified through [facility]. Co-pay $45.00, DED $0.00/$0.00 met, out of pocket $5,000.00/$283.10 met, co-insurance 0%. No pre-authorization required. [employer], 03/25/19 @ 10:25AM, FMB#[id number]  Will contact patient to see if he is interested in the Cardiac Rehab Program. If interested, patient will need to complete follow up appt. Once completed, patient will be contacted for scheduling upon review by the RN Navigator.

## 2019-03-26 ENCOUNTER — Inpatient Hospital Stay: Payer: Medicare HMO | Attending: Hematology and Oncology | Admitting: Hematology and Oncology

## 2019-03-26 ENCOUNTER — Inpatient Hospital Stay: Payer: Medicare HMO

## 2019-03-26 ENCOUNTER — Other Ambulatory Visit: Payer: Self-pay

## 2019-03-26 DIAGNOSIS — Z79899 Other long term (current) drug therapy: Secondary | ICD-10-CM | POA: Insufficient documentation

## 2019-03-26 DIAGNOSIS — Z7901 Long term (current) use of anticoagulants: Secondary | ICD-10-CM | POA: Insufficient documentation

## 2019-03-26 DIAGNOSIS — D7582 Heparin induced thrombocytopenia (HIT): Secondary | ICD-10-CM

## 2019-03-26 DIAGNOSIS — D696 Thrombocytopenia, unspecified: Secondary | ICD-10-CM | POA: Diagnosis not present

## 2019-03-26 DIAGNOSIS — D75829 Heparin-induced thrombocytopenia, unspecified: Secondary | ICD-10-CM

## 2019-03-26 LAB — CBC WITH DIFFERENTIAL (CANCER CENTER ONLY)
Abs Immature Granulocytes: 0.04 10*3/uL (ref 0.00–0.07)
Basophils Absolute: 0 10*3/uL (ref 0.0–0.1)
Basophils Relative: 0 %
Eosinophils Absolute: 0.1 10*3/uL (ref 0.0–0.5)
Eosinophils Relative: 1 %
HCT: 32.8 % — ABNORMAL LOW (ref 39.0–52.0)
Hemoglobin: 10.5 g/dL — ABNORMAL LOW (ref 13.0–17.0)
Immature Granulocytes: 1 %
Lymphocytes Relative: 28 %
Lymphs Abs: 2.1 10*3/uL (ref 0.7–4.0)
MCH: 28.3 pg (ref 26.0–34.0)
MCHC: 32 g/dL (ref 30.0–36.0)
MCV: 88.4 fL (ref 80.0–100.0)
Monocytes Absolute: 0.9 10*3/uL (ref 0.1–1.0)
Monocytes Relative: 12 %
Neutro Abs: 4.5 10*3/uL (ref 1.7–7.7)
Neutrophils Relative %: 58 %
Platelet Count: 31 10*3/uL — ABNORMAL LOW (ref 150–400)
RBC: 3.71 MIL/uL — ABNORMAL LOW (ref 4.22–5.81)
RDW: 16.9 % — ABNORMAL HIGH (ref 11.5–15.5)
WBC Count: 7.7 10*3/uL (ref 4.0–10.5)
nRBC: 0 % (ref 0.0–0.2)

## 2019-03-26 NOTE — Assessment & Plan Note (Signed)
HIT: Because of thrombosis risk in patients with HIT, I recommend anticoagulation with Coumadin for the next 3 months. Thrombocytopenia: We expect that the platelet count will slowly recover on coumadin. Lab review:

## 2019-03-27 ENCOUNTER — Telehealth (HOSPITAL_COMMUNITY): Payer: Self-pay

## 2019-03-27 ENCOUNTER — Other Ambulatory Visit: Payer: Self-pay | Admitting: Cardiothoracic Surgery

## 2019-03-27 DIAGNOSIS — J45909 Unspecified asthma, uncomplicated: Secondary | ICD-10-CM | POA: Diagnosis not present

## 2019-03-27 DIAGNOSIS — D649 Anemia, unspecified: Secondary | ICD-10-CM | POA: Diagnosis not present

## 2019-03-27 DIAGNOSIS — I739 Peripheral vascular disease, unspecified: Secondary | ICD-10-CM | POA: Diagnosis not present

## 2019-03-27 DIAGNOSIS — H539 Unspecified visual disturbance: Secondary | ICD-10-CM | POA: Diagnosis not present

## 2019-03-27 DIAGNOSIS — M1990 Unspecified osteoarthritis, unspecified site: Secondary | ICD-10-CM | POA: Diagnosis not present

## 2019-03-27 DIAGNOSIS — M810 Age-related osteoporosis without current pathological fracture: Secondary | ICD-10-CM | POA: Diagnosis not present

## 2019-03-27 DIAGNOSIS — I2510 Atherosclerotic heart disease of native coronary artery without angina pectoris: Secondary | ICD-10-CM | POA: Diagnosis not present

## 2019-03-27 DIAGNOSIS — Z951 Presence of aortocoronary bypass graft: Secondary | ICD-10-CM | POA: Diagnosis not present

## 2019-03-27 DIAGNOSIS — Z48812 Encounter for surgical aftercare following surgery on the circulatory system: Secondary | ICD-10-CM | POA: Diagnosis not present

## 2019-03-27 DIAGNOSIS — Z952 Presence of prosthetic heart valve: Secondary | ICD-10-CM

## 2019-03-27 DIAGNOSIS — Z7901 Long term (current) use of anticoagulants: Secondary | ICD-10-CM | POA: Diagnosis not present

## 2019-03-27 NOTE — Telephone Encounter (Signed)
Pt wife Cindi returned CR phone call and stated pt is interested in CR. Went over [employer], Cindi verbalized understanding. Will contact patient for scheduling once f/u has been completed.

## 2019-03-28 DIAGNOSIS — D649 Anemia, unspecified: Secondary | ICD-10-CM | POA: Diagnosis not present

## 2019-03-28 DIAGNOSIS — Z48812 Encounter for surgical aftercare following surgery on the circulatory system: Secondary | ICD-10-CM | POA: Diagnosis not present

## 2019-03-28 DIAGNOSIS — I739 Peripheral vascular disease, unspecified: Secondary | ICD-10-CM | POA: Diagnosis not present

## 2019-03-28 DIAGNOSIS — Z951 Presence of aortocoronary bypass graft: Secondary | ICD-10-CM | POA: Diagnosis not present

## 2019-03-28 DIAGNOSIS — I2510 Atherosclerotic heart disease of native coronary artery without angina pectoris: Secondary | ICD-10-CM | POA: Diagnosis not present

## 2019-03-28 DIAGNOSIS — H539 Unspecified visual disturbance: Secondary | ICD-10-CM | POA: Diagnosis not present

## 2019-03-28 DIAGNOSIS — J45909 Unspecified asthma, uncomplicated: Secondary | ICD-10-CM | POA: Diagnosis not present

## 2019-03-28 DIAGNOSIS — M1990 Unspecified osteoarthritis, unspecified site: Secondary | ICD-10-CM | POA: Diagnosis not present

## 2019-03-28 DIAGNOSIS — Z7901 Long term (current) use of anticoagulants: Secondary | ICD-10-CM | POA: Diagnosis not present

## 2019-03-28 DIAGNOSIS — M810 Age-related osteoporosis without current pathological fracture: Secondary | ICD-10-CM | POA: Diagnosis not present

## 2019-03-30 ENCOUNTER — Ambulatory Visit (INDEPENDENT_AMBULATORY_CARE_PROVIDER_SITE_OTHER): Payer: Self-pay | Admitting: Cardiothoracic Surgery

## 2019-03-30 ENCOUNTER — Ambulatory Visit
Admission: RE | Admit: 2019-03-30 | Discharge: 2019-03-30 | Disposition: A | Discharge status: Home / Self Care | Payer: Medicare HMO | Source: Ambulatory Visit | Attending: Cardiothoracic Surgery | Admitting: Cardiothoracic Surgery

## 2019-03-30 ENCOUNTER — Ambulatory Visit (INDEPENDENT_AMBULATORY_CARE_PROVIDER_SITE_OTHER): Payer: Medicare HMO | Admitting: Pharmacist Clinician (PhC)/ Clinical Pharmacy Specialist

## 2019-03-30 ENCOUNTER — Other Ambulatory Visit: Payer: Self-pay

## 2019-03-30 VITALS — BP 118/83 | HR 93 | Temp 98.4°F | Resp 24 | Ht 67.0 in | Wt 218.0 lb

## 2019-03-30 DIAGNOSIS — Z952 Presence of prosthetic heart valve: Secondary | ICD-10-CM | POA: Diagnosis not present

## 2019-03-30 DIAGNOSIS — Z7901 Long term (current) use of anticoagulants: Secondary | ICD-10-CM | POA: Diagnosis not present

## 2019-03-30 DIAGNOSIS — J90 Pleural effusion, not elsewhere classified: Secondary | ICD-10-CM | POA: Diagnosis not present

## 2019-03-30 DIAGNOSIS — D75829 Heparin-induced thrombocytopenia, unspecified: Secondary | ICD-10-CM

## 2019-03-30 DIAGNOSIS — D7582 Heparin induced thrombocytopenia (HIT): Secondary | ICD-10-CM

## 2019-03-30 DIAGNOSIS — I4891 Unspecified atrial fibrillation: Secondary | ICD-10-CM

## 2019-03-30 DIAGNOSIS — Z951 Presence of aortocoronary bypass graft: Secondary | ICD-10-CM

## 2019-03-30 LAB — POCT INR: INR: 8 — AB (ref 2.0–3.0)

## 2019-03-30 LAB — PROTIME-INR
INR: 8.7 (ref 0.9–1.2)
Prothrombin Time: 91.6 s — ABNORMAL HIGH (ref 9.1–12.0)

## 2019-03-30 NOTE — Progress Notes (Signed)
[address]       [place] [address]             [phone]     CARDIOTHORACIC SURGERY OFFICE NOTE  Referring Provider is [name], MD Primary Cardiologist is [name], MD PCP is Jinny Sanders, MD   HPI:  69 yo man s/p AVR CABG 3 weeks ago complicated by development of thrombocytopenia postoperatively ultimately found to be associated with heparin allergy. He was managed with argatroban drip then warfarin therapy and was discharged recently. He now presents for 1st postoperative appointment. He received information today that his INR is 8, but there is no sx of bleeding or shortness of breath. Denies chest pain. Has difficulty sleeping and with anxiety.    Current Outpatient Medications  Medication Sig Dispense Refill  . ALPRAZolam (XANAX) 0.5 MG tablet TAKE 1 TABLET BY MOUTH ONCE DAILY AS NEEDED FOR ANXIETY (TAKE  30  MINUTES  PRIOR  TO  PLANE  FLIGHT  OR  AS  NEEDED  FOR  ANXIETY) 20 tablet 0  . amiodarone (PACERONE) 200 MG tablet Take 1 tablet (200 mg total) by mouth daily. 30 tablet 1  . atorvastatin (LIPITOR) 80 MG tablet Take 1 tablet (80 mg total) by mouth daily. 30 tablet 3  . cyclobenzaprine (FLEXERIL) 10 MG tablet Take 1 tablet (10 mg total) by mouth at bedtime as needed for muscle spasms. 15 tablet 0  . furosemide (LASIX) 40 MG tablet Take 1 tablet by mouth once daily (Patient taking differently: Take 40 mg by mouth daily. ) 90 tablet 0  . isosorbide mononitrate (IMDUR) 30 MG 24 hr tablet Take 1 tablet (30 mg total) by mouth daily. 30 tablet 0  . latanoprost (XALATAN) 0.005 % ophthalmic solution Place 1 drop into both eyes every morning.     . metoprolol tartrate (LOPRESSOR) 25 MG tablet Take 0.5 tablets (12.5 mg total) by mouth 2 (two) times daily. 30 tablet 3  . midodrine (PROAMATINE) 2.5 MG tablet Take 1 tablet (2.5 mg total) by mouth 3 (three) times daily with meals. 90 tablet 1  . Misc Natural Products (OSTEO BI-FLEX/5-LOXIN  ADVANCED PO) Take 2 tablets by mouth daily.     . potassium chloride SA (KLOR-CON) 20 MEQ tablet Take 1 tablet by mouth once daily (Patient taking differently: Take 20 mEq by mouth daily. ) 90 tablet 1  . traMADol (ULTRAM) 50 MG tablet Take 1-2 tablets (50-100 mg total) by mouth every 4 (four) hours as needed for moderate pain. 30 tablet 0  . warfarin (COUMADIN) 2.5 MG tablet Take 1 tablet (2.5 mg total) by mouth daily. (Patient not taking: Reported on 03/30/2019) 30 tablet 3   No current facility-administered medications for this visit.      Physical Exam:   BP 118/83 (BP Location: Left Arm, Patient Position: Sitting, Cuff Size: Normal)   Pulse 93   Temp 98.4 F (36.9 C) (Temporal)   Resp (!) 24   Ht 5\' 7"  (1.702 m)   Wt 98.9 kg   SpO2 94% Comment: RA  BMI 34.14 kg/m   General:  Well-appearing, NAD  Chest:   cta  CV:   rrr  Incisions:  Healing well  Extremities:  No edema  Diagnostic Tests:  CXR with clear lung fields and stable cardiac silhouette. INR 8  Impression:  Doing reasonably well after CABG/AVR despite HIT diagnosis  Plan:  F/u in 2 weeks. F/u with warfarin clinic Stop lasix/potassium  Stop isordil and midodrine I spent in excess of 20 minutes during the conduct of this office consultation and >50% of this time involved direct face-to-face encounter with the patient for counseling and/or coordination of their care.  Level 2                 10 minutes Level 3                 15 minutes Level 4                 25 minutes Level 5                 40 minutes  B. Murvin Natal, MD 03/30/2019 4:52 PM

## 2019-03-31 ENCOUNTER — Ambulatory Visit (INDEPENDENT_AMBULATORY_CARE_PROVIDER_SITE_OTHER): Payer: Medicare HMO | Admitting: Family Medicine

## 2019-03-31 ENCOUNTER — Encounter: Payer: Self-pay | Admitting: Family Medicine

## 2019-03-31 DIAGNOSIS — Z6834 Body mass index (BMI) 34.0-34.9, adult: Secondary | ICD-10-CM

## 2019-03-31 DIAGNOSIS — D7582 Heparin induced thrombocytopenia (HIT): Secondary | ICD-10-CM | POA: Diagnosis not present

## 2019-03-31 DIAGNOSIS — Z48812 Encounter for surgical aftercare following surgery on the circulatory system: Secondary | ICD-10-CM | POA: Diagnosis not present

## 2019-03-31 DIAGNOSIS — E66811 Obesity, class 1: Secondary | ICD-10-CM

## 2019-03-31 DIAGNOSIS — J45909 Unspecified asthma, uncomplicated: Secondary | ICD-10-CM | POA: Diagnosis not present

## 2019-03-31 DIAGNOSIS — I10 Essential (primary) hypertension: Secondary | ICD-10-CM | POA: Diagnosis not present

## 2019-03-31 DIAGNOSIS — D649 Anemia, unspecified: Secondary | ICD-10-CM | POA: Diagnosis not present

## 2019-03-31 DIAGNOSIS — Z951 Presence of aortocoronary bypass graft: Secondary | ICD-10-CM | POA: Diagnosis not present

## 2019-03-31 DIAGNOSIS — R7303 Prediabetes: Secondary | ICD-10-CM

## 2019-03-31 DIAGNOSIS — H539 Unspecified visual disturbance: Secondary | ICD-10-CM | POA: Diagnosis not present

## 2019-03-31 DIAGNOSIS — I214 Non-ST elevation (NSTEMI) myocardial infarction: Secondary | ICD-10-CM

## 2019-03-31 DIAGNOSIS — M1990 Unspecified osteoarthritis, unspecified site: Secondary | ICD-10-CM | POA: Diagnosis not present

## 2019-03-31 DIAGNOSIS — I4891 Unspecified atrial fibrillation: Secondary | ICD-10-CM | POA: Diagnosis not present

## 2019-03-31 DIAGNOSIS — R69 Illness, unspecified: Secondary | ICD-10-CM | POA: Diagnosis not present

## 2019-03-31 DIAGNOSIS — L219 Seborrheic dermatitis, unspecified: Secondary | ICD-10-CM

## 2019-03-31 DIAGNOSIS — F411 Generalized anxiety disorder: Secondary | ICD-10-CM

## 2019-03-31 DIAGNOSIS — I2510 Atherosclerotic heart disease of native coronary artery without angina pectoris: Secondary | ICD-10-CM | POA: Diagnosis not present

## 2019-03-31 DIAGNOSIS — D75829 Heparin-induced thrombocytopenia, unspecified: Secondary | ICD-10-CM

## 2019-03-31 DIAGNOSIS — M810 Age-related osteoporosis without current pathological fracture: Secondary | ICD-10-CM | POA: Diagnosis not present

## 2019-03-31 DIAGNOSIS — I739 Peripheral vascular disease, unspecified: Secondary | ICD-10-CM | POA: Diagnosis not present

## 2019-03-31 DIAGNOSIS — Z7901 Long term (current) use of anticoagulants: Secondary | ICD-10-CM | POA: Diagnosis not present

## 2019-03-31 DIAGNOSIS — E661 Drug-induced obesity: Secondary | ICD-10-CM | POA: Diagnosis not present

## 2019-03-31 MED ORDER — KETOCONAZOLE 2 % EX SHAM: 1.0000 "application " | MEDICATED_SHAMPOO | CUTANEOUS | 0 refills | Status: DC

## 2019-03-31 MED ORDER — SERTRALINE HCL 50 MG PO TABS: 50.0000 mg | ORAL_TABLET | Freq: Every day | ORAL | 3 refills | Status: DC

## 2019-03-31 MED ORDER — TRAZODONE HCL 50 MG PO TABS: 25.0000 mg | ORAL_TABLET | Freq: Every evening | ORAL | 3 refills | Status: DC | PRN

## 2019-03-31 NOTE — Assessment & Plan Note (Signed)
Well controlled. Continue current medication.  

## 2019-03-31 NOTE — Assessment & Plan Note (Signed)
Starting PT gradually get into cardiac rehab.

## 2019-03-31 NOTE — Assessment & Plan Note (Signed)
Start sertraline and trazodone for insomnia. Follow up in 4 weeks.

## 2019-03-31 NOTE — Assessment & Plan Note (Signed)
On coumadin followed by heme/oncology

## 2019-03-31 NOTE — Assessment & Plan Note (Signed)
Treat with ketoconazole shampoo.

## 2019-03-31 NOTE — Assessment & Plan Note (Signed)
Stable control . Encouraged exercise as tolerated with rehab, weight loss, healthy eating habits.

## 2019-03-31 NOTE — Assessment & Plan Note (Signed)
S/P CABG. Recovering well. Euvolemic. Reviewed recent notes.

## 2019-03-31 NOTE — Progress Notes (Signed)
Chief Complaint  Patient presents with  . Follow-up    on open heart surgery  . Discuss Blood Sugar  . Anxiety    History of Present Illness: HPI  69 year old male presents for follow up.   NSTEMI S/P CABG and aortic valve replacement 3 weeks ago, complicated with HIT Cardiologist in Dr. Percival Spanish. Reviewed CVTS  Dr. Orvan Seen post OP note from 03/30/2019 Followed at coumadin clinic and by Heme/Onc Dr. Lindi Adie ( 03/26/2019 note reviewed) for HIT  Plan coumadin for 3 months...  Currently holding coumadin .. follow up at coumadin clinic  In 3 days.   He presents with questions about sugar control. He is wondering why he had insulin given in hospital.  wife check CBG at home has been in 90s. Lab Results  Component Value Date   HGBA1C 5.6 03/05/2019   He has also noted increase anxiety and trouble sleeping in last 3 weeks. He is feeling anxious all day. Uncontrollable worry. He has trouble with early morning waking.[name] always  Using alprazolam  At night occ during the day Used tramadol  Last night for sleep... that helped.Using tramadol for chest wall pain in AM. Melatonin caused bad dreams in past. Needs sleep apnea eval.   Having indigestion with meals.. started prilosec 20 mg started yesterday.   Has PT coming today.   Wt Readings from Last 3 Encounters:  03/31/19 218 lb 8 oz (99.1 kg)  03/30/19 218 lb (98.9 kg)  03/26/19 220 lb 11.2 oz (100.1 kg)     This visit occurred during the SARS-CoV-2 public health emergency.  Safety protocols were in place, including screening questions prior to the visit, additional usage of staff PPE, and extensive cleaning of exam room while observing appropriate contact time as indicated for disinfecting solutions.   COVID 19 screen:  No recent travel or known exposure to COVID19 The patient denies respiratory symptoms of COVID 19 at this time. The importance of social distancing was discussed today.     Review of Systems  Constitutional:  Negative for chills and fever.  HENT: Negative for congestion and ear pain.   Eyes: Negative for pain and redness.  Respiratory: Positive for shortness of breath. Negative for cough.   Cardiovascular: Positive for chest pain. Negative for palpitations and leg swelling.  Gastrointestinal: Negative for abdominal pain, blood in stool, constipation, diarrhea, nausea and vomiting.  Genitourinary: Negative for dysuria.  Musculoskeletal: Negative for falls and myalgias.  Skin: Negative for rash.  Neurological: Negative for dizziness.  Psychiatric/Behavioral: Negative for depression, substance abuse and suicidal ideas. The patient is nervous/anxious and has insomnia.       Past Medical History:  Diagnosis Date  . Alcohol abuse   . Aortic valve disease    AI and AS, Moderate  . Chest pain 03/2019  . Drug use   . Edema of both lower extremities   . Glaucoma   . Hypertension   . OSA (obstructive sleep apnea)   . Shingles     reports that he has never smoked. He has never used smokeless tobacco. He reports current alcohol use of about 5.0 standard drinks of alcohol per week. He reports that he does not use drugs.   Current Outpatient Medications:  .  ALPRAZolam (XANAX) 0.5 MG tablet, TAKE 1 TABLET BY MOUTH ONCE DAILY AS NEEDED FOR ANXIETY (TAKE  30  MINUTES  PRIOR  TO  PLANE  FLIGHT  OR  AS  NEEDED  FOR  ANXIETY), Disp: 20 tablet,  Rfl: 0 .  amiodarone (PACERONE) 200 MG tablet, Take 1 tablet (200 mg total) by mouth daily., Disp: 30 tablet, Rfl: 1 .  atorvastatin (LIPITOR) 80 MG tablet, Take 1 tablet (80 mg total) by mouth daily., Disp: 30 tablet, Rfl: 3 .  cyclobenzaprine (FLEXERIL) 10 MG tablet, Take 1 tablet (10 mg total) by mouth at bedtime as needed for muscle spasms., Disp: 15 tablet, Rfl: 0 .  latanoprost (XALATAN) 0.005 % ophthalmic solution, Place 1 drop into both eyes every morning. , Disp: , Rfl:  .  metoprolol tartrate (LOPRESSOR) 25 MG tablet, Take 0.5 tablets (12.5 mg total) by  mouth 2 (two) times daily., Disp: 30 tablet, Rfl: 3 .  Misc Natural Products (OSTEO BI-FLEX/5-LOXIN ADVANCED PO), Take 2 tablets by mouth daily. , Disp: , Rfl:  .  traMADol (ULTRAM) 50 MG tablet, Take 1-2 tablets (50-100 mg total) by mouth every 4 (four) hours as needed for moderate pain., Disp: 30 tablet, Rfl: 0 .  warfarin (COUMADIN) 2.5 MG tablet, Take 1 tablet (2.5 mg total) by mouth daily., Disp: 30 tablet, Rfl: 3   Observations/Objective: Blood pressure 130/78, pulse 66, temperature (!) 97.3 F (36.3 C), temperature source Temporal, height 5\' 7"  (1.702 m), weight 218 lb 8 oz (99.1 kg), SpO2 97 %.  Physical Exam Constitutional:      Appearance: He is well-developed. He is obese.  HENT:     Head: Normocephalic.     Right Ear: Hearing normal.     Left Ear: Hearing normal.     Nose: Nose normal.  Neck:     Thyroid: No thyroid mass or thyromegaly.     Vascular: No carotid bruit.     Trachea: Trachea normal.  Cardiovascular:     Rate and Rhythm: Normal rate and regular rhythm.     Pulses: Normal pulses.     Heart sounds: Heart sounds not distant. No murmur. No friction rub. No gallop.      Comments: No peripheral edema Pulmonary:     Effort: Pulmonary effort is normal. No respiratory distress.     Breath sounds: Normal breath sounds.  Skin:    General: Skin is warm and dry.     Findings: No rash.     Comments: Dry flaky skin on scalp Well healing scar central chest.    Psychiatric:        Speech: Speech normal.        Behavior: Behavior normal.        Thought Content: Thought content normal.      Assessment and Plan   NSTEMI (non-ST elevated myocardial infarction) (HCC) S/P CABG. Recovering well. Euvolemic. Reviewed recent notes.  Class 1 drug-induced obesity with serious comorbidity and body mass index (BMI) of 34.0 to 34.9 in adult Starting PT gradually get into cardiac rehab.  Prediabetes Stable control . Encouraged exercise as tolerated with rehab, weight loss,  healthy eating habits.   HIT (heparin-induced thrombocytopenia) (HCC)  On coumadin followed by heme/oncology  Essential hypertension Well controlled. Continue current medication.   Atrial fibrillation ([place]) Rate controlled on amiodarone.  GAD (generalized anxiety disorder) Start sertraline and trazodone for insomnia. Follow up in 4 weeks.   Seborrheic dermatitis Treat with ketoconazole shampoo.     [name], MD

## 2019-03-31 NOTE — Assessment & Plan Note (Signed)
Rate controlled on amiodarone.

## 2019-03-31 NOTE — Patient Instructions (Signed)
Start sertraline at night for anxiety.  If sleep still poor, you can add trazodone at bedtime.  Can use xanax as needed for breakthrough anxiety daily. Start ketoconazole shampoo for seborrheic dermatitis.

## 2019-04-01 ENCOUNTER — Telehealth: Payer: Self-pay | Admitting: Cardiology

## 2019-04-01 DIAGNOSIS — Z951 Presence of aortocoronary bypass graft: Secondary | ICD-10-CM | POA: Diagnosis not present

## 2019-04-01 DIAGNOSIS — M1990 Unspecified osteoarthritis, unspecified site: Secondary | ICD-10-CM | POA: Diagnosis not present

## 2019-04-01 DIAGNOSIS — Z48812 Encounter for surgical aftercare following surgery on the circulatory system: Secondary | ICD-10-CM | POA: Diagnosis not present

## 2019-04-01 DIAGNOSIS — M810 Age-related osteoporosis without current pathological fracture: Secondary | ICD-10-CM | POA: Diagnosis not present

## 2019-04-01 DIAGNOSIS — H539 Unspecified visual disturbance: Secondary | ICD-10-CM | POA: Diagnosis not present

## 2019-04-01 DIAGNOSIS — I739 Peripheral vascular disease, unspecified: Secondary | ICD-10-CM | POA: Diagnosis not present

## 2019-04-01 DIAGNOSIS — J45909 Unspecified asthma, uncomplicated: Secondary | ICD-10-CM | POA: Diagnosis not present

## 2019-04-01 DIAGNOSIS — Z7901 Long term (current) use of anticoagulants: Secondary | ICD-10-CM | POA: Diagnosis not present

## 2019-04-01 DIAGNOSIS — D649 Anemia, unspecified: Secondary | ICD-10-CM | POA: Diagnosis not present

## 2019-04-01 DIAGNOSIS — I2510 Atherosclerotic heart disease of native coronary artery without angina pectoris: Secondary | ICD-10-CM | POA: Diagnosis not present

## 2019-04-01 NOTE — Telephone Encounter (Signed)
Called and spoke w/dana at [facility] gave verbal order to draw inr on Friday and report to the [facility] for dosing and dana verbalized understanding

## 2019-04-01 NOTE — Telephone Encounter (Signed)
Gregory [name] from [facility] is calling requesting they take over the patient's coumadin care for the next 4 weeks by at home appointments while he is in recovery so he doesn't have to come in the office. Please advise.

## 2019-04-02 DIAGNOSIS — M810 Age-related osteoporosis without current pathological fracture: Secondary | ICD-10-CM | POA: Diagnosis not present

## 2019-04-02 DIAGNOSIS — H539 Unspecified visual disturbance: Secondary | ICD-10-CM | POA: Diagnosis not present

## 2019-04-02 DIAGNOSIS — I2510 Atherosclerotic heart disease of native coronary artery without angina pectoris: Secondary | ICD-10-CM | POA: Diagnosis not present

## 2019-04-02 DIAGNOSIS — Z951 Presence of aortocoronary bypass graft: Secondary | ICD-10-CM | POA: Diagnosis not present

## 2019-04-02 DIAGNOSIS — D649 Anemia, unspecified: Secondary | ICD-10-CM | POA: Diagnosis not present

## 2019-04-02 DIAGNOSIS — J45909 Unspecified asthma, uncomplicated: Secondary | ICD-10-CM | POA: Diagnosis not present

## 2019-04-02 DIAGNOSIS — Z7901 Long term (current) use of anticoagulants: Secondary | ICD-10-CM | POA: Diagnosis not present

## 2019-04-02 DIAGNOSIS — M1990 Unspecified osteoarthritis, unspecified site: Secondary | ICD-10-CM | POA: Diagnosis not present

## 2019-04-02 DIAGNOSIS — Z48812 Encounter for surgical aftercare following surgery on the circulatory system: Secondary | ICD-10-CM | POA: Diagnosis not present

## 2019-04-02 DIAGNOSIS — I739 Peripheral vascular disease, unspecified: Secondary | ICD-10-CM | POA: Diagnosis not present

## 2019-04-03 ENCOUNTER — Other Ambulatory Visit: Payer: Self-pay | Admitting: *Deleted

## 2019-04-03 ENCOUNTER — Ambulatory Visit: Payer: Self-pay | Admitting: Pharmacist Clinician (PhC)/ Clinical Pharmacy Specialist

## 2019-04-03 DIAGNOSIS — I2510 Atherosclerotic heart disease of native coronary artery without angina pectoris: Secondary | ICD-10-CM | POA: Diagnosis not present

## 2019-04-03 DIAGNOSIS — D75829 Heparin-induced thrombocytopenia, unspecified: Secondary | ICD-10-CM

## 2019-04-03 DIAGNOSIS — J45909 Unspecified asthma, uncomplicated: Secondary | ICD-10-CM | POA: Diagnosis not present

## 2019-04-03 DIAGNOSIS — Z48812 Encounter for surgical aftercare following surgery on the circulatory system: Secondary | ICD-10-CM | POA: Diagnosis not present

## 2019-04-03 DIAGNOSIS — D649 Anemia, unspecified: Secondary | ICD-10-CM | POA: Diagnosis not present

## 2019-04-03 DIAGNOSIS — Z7901 Long term (current) use of anticoagulants: Secondary | ICD-10-CM

## 2019-04-03 DIAGNOSIS — I739 Peripheral vascular disease, unspecified: Secondary | ICD-10-CM | POA: Diagnosis not present

## 2019-04-03 DIAGNOSIS — Z951 Presence of aortocoronary bypass graft: Secondary | ICD-10-CM | POA: Diagnosis not present

## 2019-04-03 DIAGNOSIS — Z952 Presence of prosthetic heart valve: Secondary | ICD-10-CM

## 2019-04-03 DIAGNOSIS — D7582 Heparin induced thrombocytopenia (HIT): Secondary | ICD-10-CM

## 2019-04-03 DIAGNOSIS — H539 Unspecified visual disturbance: Secondary | ICD-10-CM | POA: Diagnosis not present

## 2019-04-03 DIAGNOSIS — I4891 Unspecified atrial fibrillation: Secondary | ICD-10-CM

## 2019-04-03 DIAGNOSIS — M810 Age-related osteoporosis without current pathological fracture: Secondary | ICD-10-CM | POA: Diagnosis not present

## 2019-04-03 DIAGNOSIS — M1990 Unspecified osteoarthritis, unspecified site: Secondary | ICD-10-CM | POA: Diagnosis not present

## 2019-04-03 LAB — POCT INR: INR: 3.9 — AB (ref 2.0–3.0)

## 2019-04-06 ENCOUNTER — Ambulatory Visit: Payer: Medicare HMO | Admitting: Cardiothoracic Surgery

## 2019-04-06 NOTE — Progress Notes (Signed)
Cardiology Office Note   Date:  04/08/2019   ID:  Gregory [name], DOB [birth year], MRN [id number]  PCP:  Jinny Sanders, MD  Cardiologist: Dr. Percival Spanish  No chief complaint on file.    History of Present Illness: Gregory [name] is a 69 y.o. male who presents for ongoing assessment and management of LAD and RCA disease with moderate AI / AS.  He was admitted in the setting of NSTEMI after coming to the ER with chest pain.  He was positive for ACS and therefore cardiac catheterization was completed.   He had a cardiac catheterization during recent hospitalization on 03/04/2019 which revealed severe calcific disease in the mid LAD, the circumflex had mild to moderate proximal stenosis, the RCA, a large dominant vessel, had severe proximal stenosis and severe mid stenosis with poor flow into the distal vessel.  He was also found to have severe aortic regurgitation and moderate aortic valve stenosis, with a mean gradient across aortic valve 56mmHg with AVA, but judged to be severe aortic insufficiency in the setting of dilated aortic root with high flow from AI leading to elevated gradient across aortic valve.  He was therefore referred to CVTS for bypass.  He ultimately underwent coronary artery bypass graft with aortic valve replacement on 03/06/2019.  The patient had LIMA to LAD, left radial artery to PDA, predicted RIMA to the right posterior lateral artery, with open left radial artery harvest, bilateral IMA harvesting.  Also, 25 mm [name] Intuity Bovine bioprosthetic aortic valve placement.  Of note, the patient had a severe reaction to heparin with HIT and severe profound thrombocytopenia postoperatively. Therefore, heparin was added to his medication allergy list.  He was transitioned to Coumadin.  Patient did have some postoperative atrial fibrillation and therefore was started on amiodarone 200 mg daily.  He will need 6-week postoperative repeat echocardiogram ordered today.   He has had his  INR checked tefoday by hematology. It was found to be elevated at 4.0. He has now also sustained a large hematoma of the left forearm at the proximal portion of his radial artery harvest. It is soft and non-painful. The CVTS surgeon has told him to stop coumadin until hearing from him. He is due for repeat labs tomorrow and in 5 days. He has also injured his left great toe which occurred when he was undergoing PT.  He has had an X-ray completed today to evaluate for fracture.   He come today frail, mildly depressed but continues to persevere though all of sequela from CABG and HIT. He states he is not sleeping well and energy is slow to return. He denies active bleeding, hemoptysis or melena.   Past Medical History:  Diagnosis Date  . Alcohol abuse   . Aortic valve disease    AI and AS, Moderate  . Chest pain 03/2019  . Drug use   . Edema of both lower extremities   . Glaucoma   . Hypertension   . OSA (obstructive sleep apnea)   . Shingles     Past Surgical History:  Procedure Laterality Date  . AORTIC VALVE REPLACEMENT N/A 03/06/2019   Procedure: AORTIC VALVE REPLACEMENT (AVR), USING INTUITY 25MM;  Surgeon: Wonda Olds, MD;  Location: [place];  Service: Open Heart Surgery;  Laterality: N/A;  . CATARACT EXTRACTION    . CORONARY ARTERY BYPASS GRAFT N/A 03/06/2019   Procedure: CORONARY ARTERY BYPASS GRAFTING (CABG), ON PUMP, TIMES THREE, USING BILATERAL INTERNAL MAMMARIES AND LEFT RADIAL ARTERY HARVEST;  Surgeon: Wonda Olds, MD;  Location: [place];  Service: Open Heart Surgery;  Laterality: N/A;  BILATERAL IMA  . LEFT HEART CATH AND CORONARY ANGIOGRAPHY N/A 03/04/2019   Procedure: LEFT HEART CATH AND CORONARY ANGIOGRAPHY;  Surgeon: Burnell Blanks, MD;  Location: [place] [facility];  Service: Cardiovascular;  Laterality: N/A;  . None    . RADIAL ARTERY HARVEST Left 03/06/2019   Procedure: RADIAL ARTERY HARVEST;  Surgeon: Wonda Olds, MD;  Location: [place];  Service: Open  Heart Surgery;  Laterality: Left;  . TEE WITHOUT CARDIOVERSION N/A 03/06/2019   Procedure: TRANSESOPHAGEAL ECHOCARDIOGRAM (TEE);  Surgeon: Wonda Olds, MD;  Location: [place];  Service: Open Heart Surgery;  Laterality: N/A;     Current Outpatient Medications  Medication Sig Dispense Refill  . ALPRAZolam (XANAX) 0.5 MG tablet TAKE 1 TABLET BY MOUTH ONCE DAILY AS NEEDED FOR ANXIETY (TAKE  30  MINUTES  PRIOR  TO  PLANE  FLIGHT  OR  AS  NEEDED  FOR  ANXIETY) 20 tablet 0  . amiodarone (PACERONE) 200 MG tablet Take 1 tablet (200 mg total) by mouth daily. 30 tablet 1  . atorvastatin (LIPITOR) 80 MG tablet Take 1 tablet (80 mg total) by mouth daily. 30 tablet 3  . cyclobenzaprine (FLEXERIL) 10 MG tablet Take 1 tablet (10 mg total) by mouth at bedtime as needed for muscle spasms. 15 tablet 0  . ketoconazole (NIZORAL) 2 % shampoo Apply 1 application topically 2 (two) times a week. 120 mL 0  . latanoprost (XALATAN) 0.005 % ophthalmic solution Place 1 drop into both eyes every morning.     . metoprolol tartrate (LOPRESSOR) 25 MG tablet Take 0.5 tablets (12.5 mg total) by mouth 2 (two) times daily. 30 tablet 3  . Misc Natural Products (OSTEO BI-FLEX/5-LOXIN ADVANCED PO) Take 2 tablets by mouth daily.     . sertraline (ZOLOFT) 50 MG tablet Take 1 tablet (50 mg total) by mouth daily. 30 tablet 3  . traMADol (ULTRAM) 50 MG tablet Take 1-2 tablets (50-100 mg total) by mouth every 4 (four) hours as needed for moderate pain. 30 tablet 0  . traZODone (DESYREL) 50 MG tablet Take 0.5-1 tablets (25-50 mg total) by mouth at bedtime as needed for sleep. 30 tablet 3  . warfarin (COUMADIN) 2.5 MG tablet Take 1 tablet (2.5 mg total) by mouth daily. 30 tablet 3   No current facility-administered medications for this visit.    Allergies:   Heparin    Social History:  The patient  reports that he has never smoked. He has never used smokeless tobacco. He reports current alcohol use of about 5.0 standard drinks of  alcohol per week. He reports that he does not use drugs.   Family History:  The patient's family history includes Alcohol abuse in his father; Breast cancer in his sister; Cancer in his mother. He was adopted.    ROS: All other systems are reviewed and negative. Unless otherwise mentioned in H&P    PHYSICAL EXAM: VS:  BP 104/72   Pulse 67   Ht 5\' 7"  (1.702 m)   Wt 215 lb (97.5 kg)   BMI 33.67 kg/m  , BMI Body mass index is 33.67 kg/m. GEN: Well nourished, well developed, in no acute distress HEENT: normal Neck: no JVD, carotid bruits, or masses Cardiac: RRR; 1/6 systolic murmur, heard best at the RSB,  rubs, or gallops,no edema  Respiratory:  Clear to auscultation bilaterally, normal work of breathingef  GI: soft, nontender, nondistended, + BS MS: no deformity or atrophy. Hematoma of the left forearm, soft.Good radial pulse on the left.  Skin: warm and dry, no rash Neuro:  Strength and sensation are intact Psych: euthymic mood, full affect   EKG:  SR with LBBB. Rate of 67 bpm  Recent Labs: 08/04/2018: ALT 16 08/14/2018: TSH 2.560 03/07/2019: Magnesium 2.3 03/17/2019: BUN 29; Creatinine, Ser 1.12; Potassium 4.3; Sodium 136 03/26/2019: Hemoglobin 10.5; Platelet Count 31    Lipid Panel    Component Value Date/Time   CHOL 161 08/04/2018 1052   TRIG 105 08/04/2018 1052   TRIG 162 (H) 10/25/2005 1507   HDL 66 08/04/2018 1052   CHOLHDL 2.4 08/04/2018 1052   CHOLHDL 3 01/14/2018 0752   VLDL 25.8 01/14/2018 0752   LDLCALC 74 08/04/2018 1052   LDLDIRECT 151.0 01/05/2016 0748      Wt Readings from Last 3 Encounters:  04/08/19 215 lb (97.5 kg)  04/08/19 211 lb (95.7 kg)  03/31/19 218 lb 8 oz (99.1 kg)      Other studies Reviewed:  LHC 03/04/2019 Conclusion    Prox RCA lesion is 70% stenosed.  Mid RCA lesion is 95% stenosed.  RV Branch lesion is 80% stenosed.  Prox Cx lesion is 40% stenosed.  Prox LAD to Mid LAD lesion is 90% stenosed.  Mid LAD lesion is 70%  stenosed.   1. Severe calcific disease in the mid LAD 2. The Circumflex has mild to moderate proximal stenosis 3. The RCA is a large dominant vessel with severe proximal stenosis and severe mid stenosis with poor flow into the distal vessel.   Recommendations: He has complex disease in the mid LAD, diffuse complex disease in the proximal and mid RCA and moderate AI/AS. I suspect that his aortic root is enlarged.. Echo pending today. He is known to have at least moderate AS and AI by prior echo. I think he would benefit from surgical AVR and bypass of the LAD and RCA. We will arrange a cardiac CTA tomorrow to better assess his aortic root and ascending aorta.    Echocardiogram 03/06/2019 POST-OP IMPRESSIONS  - Left Ventricle: The left ventricle is unchanged from pre-bypass.  - Aorta: The aorta appears unchanged from pre-bypass.  - Aortic Valve: A homograft bioprosthetic valve was placed, leaflets are  not  freely mobile. Normal washing jets for valve type.  - Mitral Valve: There is no regurgitation.   PRE-OP FINDINGS  Left Ventricle: The left ventricle has low normal systolic function, with  an ejection fraction of 50-55%. The cavity size was normal. There is no  increase in left ventricular wall thickness.   Right Ventricle: The right ventricle has normal systolic function. The  cavity was normal. There is no increase in right ventricular wall  thickness.   Left Atrium: Left atrial size was normal in size. The left atrial  appendage is well visualized and there is no evidence of thrombus present.   Right Atrium: Right atrial size was normal in size. Right atrial pressure  is estimated at 10 mmHg.   Interatrial Septum: No atrial level shunt detected by color flow Doppler.   Pericardium: There is no evidence of pericardial effusion.   Mitral Valve: The mitral valve is normal in structure. No thickening of  the mitral valve leaflet. No calcification of the mitral valve leaflet.    Mitral valve regurgitation is mild by color flow Doppler. The MR jet is  centrally-directed.   Tricuspid Valve: The tricuspid valve  was normal in structure. Tricuspid  valve regurgitation was not visualized by color flow Doppler.   Aortic Valve: The aortic valve is tricuspid There is severe thickening of  the aortic valve and There is severe calcifcation of the aortic valve  Aortic valve regurgitation is severe by color flow Doppler. The jet is SAM  present. There is moderate stenosis  of the aortic valve.   Pulmonic Valve: The pulmonic valve was normal in structure.  Pulmonic valve regurgitation is not visualized by color flow Doppler.    Aorta: The aortic root, ascending aorta and aortic arch are normal in size  and structure.    ASSESSMENT AND PLAN:  1. CAD: S/P CABG. He has had significant complications from this, with HIT, atrial fib and significant deconditioning. He is undergoing PT but has injured left great toe. lHe is very frail. I have spoken to him to encourage him to take it slow and make steady forward progress. No changes in his regimen at this time. He will follow up in 6 weeks with Dr Percival Spanish.   2. HIT: Has been placed on coumadin but noew on hold in the setting of elevated INR of 4.0, and hematoma of the left radial artery harvest site. He is being followed by hematology who is managing coumadin and INR checks.   3. PAF: Currently in NSR with LBBB on amiodarone, which is contributing to elevated INR. Should be able to stop this in another month.  However, with anemia, may need to wait a little longer with higher incidence of atrial fib in this setting  4. S/P Bioprosthetic AoV replacement: Followed by CVTS. He is to have SBE prophylaxis. Not currently on coumadin per Dr. Orvan Seen, CVTS.   5. Hyperlipidemia: On atorvastatin 80 mg daily. Goal of LDL < 70.     Current medicines are reviewed at length with the patient today.  I have spent 45 minutes dedicated to the  care of this patient on the date of this encounter to include pre-visit review of records, assessment, management and diagnostic testing,with shared decision making.  Labs/ tests ordered today include: None  Gregory [name], ANP, AACC   04/08/2019 2:52 PM    [facility] [place] [address] Office [phone] Fax [phone]  Notice: This dictation was prepared with Dragon dictation along with smaller phrase technology. Any transcriptional errors that result from this process are unintentional and may not be corrected upon review.

## 2019-04-07 DIAGNOSIS — Z7901 Long term (current) use of anticoagulants: Secondary | ICD-10-CM | POA: Diagnosis not present

## 2019-04-07 DIAGNOSIS — J45909 Unspecified asthma, uncomplicated: Secondary | ICD-10-CM | POA: Diagnosis not present

## 2019-04-07 DIAGNOSIS — M1990 Unspecified osteoarthritis, unspecified site: Secondary | ICD-10-CM | POA: Diagnosis not present

## 2019-04-07 DIAGNOSIS — H539 Unspecified visual disturbance: Secondary | ICD-10-CM | POA: Diagnosis not present

## 2019-04-07 DIAGNOSIS — I2510 Atherosclerotic heart disease of native coronary artery without angina pectoris: Secondary | ICD-10-CM | POA: Diagnosis not present

## 2019-04-07 DIAGNOSIS — I739 Peripheral vascular disease, unspecified: Secondary | ICD-10-CM | POA: Diagnosis not present

## 2019-04-07 DIAGNOSIS — D649 Anemia, unspecified: Secondary | ICD-10-CM | POA: Diagnosis not present

## 2019-04-07 DIAGNOSIS — Z951 Presence of aortocoronary bypass graft: Secondary | ICD-10-CM | POA: Diagnosis not present

## 2019-04-07 DIAGNOSIS — M810 Age-related osteoporosis without current pathological fracture: Secondary | ICD-10-CM | POA: Diagnosis not present

## 2019-04-07 DIAGNOSIS — Z48812 Encounter for surgical aftercare following surgery on the circulatory system: Secondary | ICD-10-CM | POA: Diagnosis not present

## 2019-04-08 ENCOUNTER — Ambulatory Visit: Payer: Self-pay | Admitting: Physician Assistant

## 2019-04-08 ENCOUNTER — Encounter: Payer: Self-pay | Admitting: Adult Health

## 2019-04-08 ENCOUNTER — Telehealth: Payer: Self-pay

## 2019-04-08 ENCOUNTER — Other Ambulatory Visit: Payer: Self-pay

## 2019-04-08 ENCOUNTER — Ambulatory Visit
Admission: RE | Admit: 2019-04-08 | Discharge: 2019-04-08 | Disposition: A | Discharge status: Home / Self Care | Payer: Medicare HMO | Source: Ambulatory Visit | Attending: Physician Assistant | Admitting: Physician Assistant

## 2019-04-08 ENCOUNTER — Ambulatory Visit: Payer: Medicare HMO | Admitting: Adult Health

## 2019-04-08 ENCOUNTER — Ambulatory Visit (INDEPENDENT_AMBULATORY_CARE_PROVIDER_SITE_OTHER): Payer: Medicare HMO | Admitting: Cardiology

## 2019-04-08 VITALS — BP 104/72 | HR 67 | Ht 67.0 in | Wt 215.0 lb

## 2019-04-08 VITALS — BP 123/80 | HR 61 | Temp 97.7°F | Resp 24 | Ht 67.0 in | Wt 211.0 lb

## 2019-04-08 DIAGNOSIS — M810 Age-related osteoporosis without current pathological fracture: Secondary | ICD-10-CM | POA: Diagnosis not present

## 2019-04-08 DIAGNOSIS — Z7901 Long term (current) use of anticoagulants: Secondary | ICD-10-CM | POA: Diagnosis not present

## 2019-04-08 DIAGNOSIS — Z952 Presence of prosthetic heart valve: Secondary | ICD-10-CM | POA: Diagnosis not present

## 2019-04-08 DIAGNOSIS — H539 Unspecified visual disturbance: Secondary | ICD-10-CM | POA: Diagnosis not present

## 2019-04-08 DIAGNOSIS — I350 Nonrheumatic aortic (valve) stenosis: Secondary | ICD-10-CM

## 2019-04-08 DIAGNOSIS — I739 Peripheral vascular disease, unspecified: Secondary | ICD-10-CM | POA: Diagnosis not present

## 2019-04-08 DIAGNOSIS — M19072 Primary osteoarthritis, left ankle and foot: Secondary | ICD-10-CM | POA: Diagnosis not present

## 2019-04-08 DIAGNOSIS — M1990 Unspecified osteoarthritis, unspecified site: Secondary | ICD-10-CM | POA: Diagnosis not present

## 2019-04-08 DIAGNOSIS — Z951 Presence of aortocoronary bypass graft: Secondary | ICD-10-CM | POA: Diagnosis not present

## 2019-04-08 DIAGNOSIS — I2510 Atherosclerotic heart disease of native coronary artery without angina pectoris: Secondary | ICD-10-CM | POA: Diagnosis not present

## 2019-04-08 DIAGNOSIS — E7800 Pure hypercholesterolemia, unspecified: Secondary | ICD-10-CM

## 2019-04-08 DIAGNOSIS — M79672 Pain in left foot: Secondary | ICD-10-CM

## 2019-04-08 DIAGNOSIS — D75829 Heparin-induced thrombocytopenia, unspecified: Secondary | ICD-10-CM

## 2019-04-08 DIAGNOSIS — S93135A Subluxation of interphalangeal joint of left lesser toe(s), initial encounter: Secondary | ICD-10-CM | POA: Diagnosis not present

## 2019-04-08 DIAGNOSIS — Z48812 Encounter for surgical aftercare following surgery on the circulatory system: Secondary | ICD-10-CM | POA: Diagnosis not present

## 2019-04-08 DIAGNOSIS — D7582 Heparin induced thrombocytopenia (HIT): Secondary | ICD-10-CM | POA: Diagnosis not present

## 2019-04-08 DIAGNOSIS — J45909 Unspecified asthma, uncomplicated: Secondary | ICD-10-CM | POA: Diagnosis not present

## 2019-04-08 DIAGNOSIS — D649 Anemia, unspecified: Secondary | ICD-10-CM | POA: Diagnosis not present

## 2019-04-08 DIAGNOSIS — I4891 Unspecified atrial fibrillation: Secondary | ICD-10-CM

## 2019-04-08 LAB — POCT INR: INR: 4.1 — AB (ref 2.0–3.0)

## 2019-04-08 NOTE — Progress Notes (Signed)
Patient Care Team: Jinny Sanders, MD as PCP - General (Family Medicine) [name], MD as PCP - Cardiology (Cardiology) Lendon Colonel, NP as Nurse Practitioner (Cardiology)  DIAGNOSIS:    ICD-10-CM   1. HIT (heparin-induced thrombocytopenia) (HCC)  D75.82     CHIEF COMPLIANT: Follow-up of heparin-induced thrombocytopenia   INTERVAL HISTORY: Gregory [name] is a 69 y.o. with above-mentioned history of Heparin-inducedthrombocytopenia who is currently on anticoagulation with Coumadin. He presents to the clinic today for follow-up.  He has noticed swelling of his left forearm for the past few days.  He went to see the surgeon and they instructed him to keep his arms elevated.  He tells me that it is getting more harder and is worried about hematoma.  This is at the site of his surgical incision for his graft.  ALLERGIES:  is allergic to heparin.  MEDICATIONS:  Current Outpatient Medications  Medication Sig Dispense Refill  . ALPRAZolam (XANAX) 0.5 MG tablet TAKE 1 TABLET BY MOUTH ONCE DAILY AS NEEDED FOR ANXIETY (TAKE  30  MINUTES  PRIOR  TO  PLANE  FLIGHT  OR  AS  NEEDED  FOR  ANXIETY) 20 tablet 0  . amiodarone (PACERONE) 200 MG tablet Take 1 tablet (200 mg total) by mouth daily. 30 tablet 1  . atorvastatin (LIPITOR) 80 MG tablet Take 1 tablet (80 mg total) by mouth daily. 30 tablet 3  . cyclobenzaprine (FLEXERIL) 10 MG tablet Take 1 tablet (10 mg total) by mouth at bedtime as needed for muscle spasms. 15 tablet 0  . ketoconazole (NIZORAL) 2 % shampoo Apply 1 application topically 2 (two) times a week. 120 mL 0  . latanoprost (XALATAN) 0.005 % ophthalmic solution Place 1 drop into both eyes every morning.     . metoprolol tartrate (LOPRESSOR) 25 MG tablet Take 0.5 tablets (12.5 mg total) by mouth 2 (two) times daily. 30 tablet 3  . Misc Natural Products (OSTEO BI-FLEX/5-LOXIN ADVANCED PO) Take 2 tablets by mouth daily.     . sertraline (ZOLOFT) 50 MG tablet Take 1 tablet  (50 mg total) by mouth daily. 30 tablet 3  . traMADol (ULTRAM) 50 MG tablet Take 1-2 tablets (50-100 mg total) by mouth every 4 (four) hours as needed for moderate pain. 30 tablet 0  . traZODone (DESYREL) 50 MG tablet Take 0.5-1 tablets (25-50 mg total) by mouth at bedtime as needed for sleep. 30 tablet 3  . warfarin (COUMADIN) 2.5 MG tablet Take 1 tablet (2.5 mg total) by mouth daily. 30 tablet 3   No current facility-administered medications for this visit.    PHYSICAL EXAMINATION: ECOG PERFORMANCE STATUS: 1 - Symptomatic but completely ambulatory  Vitals:   04/09/19 1048  BP: 123/74  Pulse: (!) 59  Resp: 17  Temp: 98.3 F (36.8 C)  SpO2: 98%   Filed Weights   04/09/19 1048  Weight: 214 lb 12.8 oz (97.4 kg)    LABORATORY DATA:  I have reviewed the data as listed CMP Latest Ref Rng & Units 03/17/2019 03/16/2019 03/15/2019  Glucose 70 - 99 mg/dL 106(H) 96 108(H)  BUN 8 - 23 mg/dL 29(H) 25(H) 21  Creatinine 0.61 - 1.24 mg/dL 1.12 1.03 0.97  Sodium 135 - 145 mmol/L 136 139 138  Potassium 3.5 - 5.1 mmol/L 4.3 4.1 4.5  Chloride 98 - 111 mmol/L 92(L) 94(L) 94(L)  CO2 22 - 32 mmol/L 31 34(H) 32  Calcium 8.9 - 10.3 mg/dL 8.1(L) 8.2(L) 8.2(L)  Total Protein 6.0 -  8.5 g/dL - - -  Total Bilirubin 0.0 - 1.2 mg/dL - - -  Alkaline Phos 39 - 117 IU/L - - -  AST 0 - 40 IU/L - - -  ALT 0 - 44 IU/L - - -    Lab Results  Component Value Date   WBC 8.2 04/09/2019   HGB 10.2 (L) 04/09/2019   HCT 32.5 (L) 04/09/2019   MCV 87.6 04/09/2019   PLT 79 (L) 04/09/2019   NEUTROABS 5.4 04/09/2019    ASSESSMENT & PLAN:  HIT (heparin-induced thrombocytopenia) (HCC) Diagnosed when he was in the hospital with NSTEMI status post CABG on 03/06/2019.  Postoperatively platelet count went down to 17,000.  HIT antibody panel was positive. Current treatment: Coumadin (on hold)  Lab review: Platelet count 79  Left arm swelling: We will get an ultrasound tomorrow.  We will call him with results of this  test.  If there is any concern for hematoma he will need to see his surgeon back again. He will hold off on taking Coumadin until he meets with the surgeon.  Return to clinic in 1 month with labs and follow-up.  If his platelet count normalizes, we can see him on an as-needed basis. No orders of the defined types were placed in this encounter.  The patient has a good understanding of the overall plan. he agrees with it. he will call with any problems that may develop before the next visit here.  Total time spent: 20 mins including face to face time and time spent for planning, charting and coordination of care  Nicholas Lose, MD 04/09/2019  I, Cloyde Reams Dorshimer, am acting as scribe for Dr. Nicholas Lose.  I have reviewed the above documentation for accuracy and completeness, and I agree with the above.

## 2019-04-08 NOTE — Progress Notes (Signed)
[address]       [place] [address]             [phone]       Gregory [name] is a 69 y.o. male patient s/p CABG x 3 with all arterial conduit and AVR on 3/5. The patient's hospitalization was complicated by HIT and he was started on coumadin on discharge. His INR was 2.3 on discharge but I noted it was supratherapeutic ( > 8) when he came to the office on 3/29 to see Dr. Orvan [name] It is 4.1 today which is still supratherapeutic with a goal INR of 2.0-3.0.     1. Acute pain of left foot    Past Medical History:  Diagnosis Date  . Alcohol abuse   . Aortic valve disease    AI and AS, Moderate  . Chest pain 03/2019  . Drug use   . Edema of both lower extremities   . Glaucoma   . Hypertension   . OSA (obstructive sleep apnea)   . Shingles    No past surgical history pertinent negatives on file. Scheduled Meds: Current Outpatient Medications on File Prior to Visit  Medication Sig Dispense Refill  . ALPRAZolam (XANAX) 0.5 MG tablet TAKE 1 TABLET BY MOUTH ONCE DAILY AS NEEDED FOR ANXIETY (TAKE  30  MINUTES  PRIOR  TO  PLANE  FLIGHT  OR  AS  NEEDED  FOR  ANXIETY) 20 tablet 0  . amiodarone (PACERONE) 200 MG tablet Take 1 tablet (200 mg total) by mouth daily. 30 tablet 1  . atorvastatin (LIPITOR) 80 MG tablet Take 1 tablet (80 mg total) by mouth daily. 30 tablet 3  . cyclobenzaprine (FLEXERIL) 10 MG tablet Take 1 tablet (10 mg total) by mouth at bedtime as needed for muscle spasms. 15 tablet 0  . ketoconazole (NIZORAL) 2 % shampoo Apply 1 application topically 2 (two) times a week. 120 mL 0  . latanoprost (XALATAN) 0.005 % ophthalmic solution Place 1 drop into both eyes every morning.     . metoprolol tartrate (LOPRESSOR) 25 MG tablet Take 0.5 tablets (12.5 mg total) by mouth 2 (two) times daily. 30 tablet 3  . Misc Natural Products (OSTEO BI-FLEX/5-LOXIN ADVANCED PO) Take 2 tablets by mouth daily.     . sertraline (ZOLOFT) 50 MG tablet Take 1 tablet (50 mg  total) by mouth daily. 30 tablet 3  . traMADol (ULTRAM) 50 MG tablet Take 1-2 tablets (50-100 mg total) by mouth every 4 (four) hours as needed for moderate pain. 30 tablet 0  . traZODone (DESYREL) 50 MG tablet Take 0.5-1 tablets (25-50 mg total) by mouth at bedtime as needed for sleep. 30 tablet 3  . warfarin (COUMADIN) 2.5 MG tablet Take 1 tablet (2.5 mg total) by mouth daily. 30 tablet 3   No current facility-administered medications on file prior to visit.    Allergies  Allergen Reactions  . Heparin     HIT antibody and SRA positive   Active Problems:   * No active hospital problems. *  Blood pressure 123/80, pulse 61, temperature 97.7 F (36.5 C), temperature source Temporal, resp. rate (!) 24, height 5\' 7"  (1.702 m), weight 211 lb (95.7 kg), SpO2 97 %.  Subjective patient returns to our office for a left open radial harvest incision check.  Objective   Cor: Regular rate and rhythm, no murmur Pulm: Clear to auscultation bilaterally Abd: No tenderness Ext: Left ankle edema which is  chronic for the patient.  Left big toe is red and tender Wound: Left radial hematoma noted in the picture below   No CXR to review     Left open radial artery harvest site. 10cm by 4.5cm hematoma.    Assessment & Plan   I asked Dr. Darcey [name] to assess his left radial artery harvest site hematoma.  It is soft to the touch and does not appear infected.  This hematoma developed over the last 1 to 2 days per the patient.  His INR is still supratherapeutic at 4.0 according to today's results.  Dr. Darcey [name] wanted the patient to stop Coumadin for now and resume when Dr. Orvan [name] is comfortable.  He does have a hematology appointment tomorrow and will likely have an INR drawn at that time.  He also comes to the clinic complaining of left toe pain.  This is also new and his wife is asking for a foot x-ray.  Otherwise the patient is doing well.  He continues to use a walker for mobilization.  For now, our  recommendations are to keep the left arm in a sling and elevated.  Continue to monitor its size and if it does continue to enlarge please contact our office.  If this is the case then a ultrasound would be indicated.  A left foot x-ray was ordered per Dr. Thayer [name] request.  The patient has a follow-up visit with Dr. Orvan [name] on 04/13/2019.  I have attached a picture of the hematoma at this time so that he may compare when he sees the patient next week.  Follow-up next week with Dr. Julien [name]  Please keep arm elevated and in a sling.  Holding Coumadin for now.  Gregory [name] 04/08/2019

## 2019-04-08 NOTE — Patient Instructions (Signed)
Medication Instructions:  Continue current medications  *If you need a refill on your cardiac medications before your next appointment, please call your pharmacy*   Lab Work: None Ordered  Testing/Procedures: None Ordered   Follow-Up: At [facility], you and your health needs are our priority.  As part of our continuing mission to provide you with exceptional heart care, we have created designated Provider Care Teams.  These Care Teams include your primary Cardiologist (physician) and Advanced Practice Providers (APPs -  Physician Assistants and Nurse Practitioners) who all work together to provide you with the care you need, when you need it.  We recommend signing up for the patient portal called "MyChart".  Sign up information is provided on this After Visit Summary.  MyChart is used to connect with patients for Virtual Visits (Telemedicine).  Patients are able to view lab/test results, encounter notes, upcoming appointments, etc.  Non-urgent messages can be sent to your provider as well.   To learn more about what you can do with MyChart, go to [email or web address].    Your next appointment:   Wednesday May 19th @ 2:00 pm  The format for your next appointment:   In Person  Provider:   [name], MD

## 2019-04-08 NOTE — Telephone Encounter (Addendum)
Pt's wife calls office to report a "knot" beneath his L wrist incision, s/p CABG by Dr. Orvan Seen on 03/06/19. Pt noticed this yesterday and states it has increased in size since then. Home care RN is present and says the area measures 5 cm x 10 cm in diameter. Reports pt is without c/o and VSS. PA visit scheduled for today at 1230.

## 2019-04-09 ENCOUNTER — Other Ambulatory Visit: Payer: Self-pay

## 2019-04-09 ENCOUNTER — Inpatient Hospital Stay: Payer: Medicare HMO | Attending: Hematology and Oncology

## 2019-04-09 ENCOUNTER — Emergency Department (HOSPITAL_COMMUNITY)
Admission: EM | Admit: 2019-04-09 | Discharge: 2019-04-09 | Disposition: A | Discharge status: Home / Self Care | Payer: Medicare HMO | Attending: Emergency Medicine | Admitting: Emergency Medicine

## 2019-04-09 ENCOUNTER — Inpatient Hospital Stay (HOSPITAL_BASED_OUTPATIENT_CLINIC_OR_DEPARTMENT_OTHER): Payer: Medicare HMO | Admitting: Hematology and Oncology

## 2019-04-09 ENCOUNTER — Encounter (HOSPITAL_COMMUNITY): Payer: Self-pay | Admitting: *Deleted

## 2019-04-09 DIAGNOSIS — D7582 Heparin induced thrombocytopenia (HIT): Secondary | ICD-10-CM

## 2019-04-09 DIAGNOSIS — L7632 Postprocedural hematoma of skin and subcutaneous tissue following other procedure: Secondary | ICD-10-CM | POA: Diagnosis present

## 2019-04-09 DIAGNOSIS — D75829 Heparin-induced thrombocytopenia, unspecified: Secondary | ICD-10-CM

## 2019-04-09 DIAGNOSIS — T148XXA Other injury of unspecified body region, initial encounter: Secondary | ICD-10-CM

## 2019-04-09 DIAGNOSIS — S40022A Contusion of left upper arm, initial encounter: Secondary | ICD-10-CM

## 2019-04-09 HISTORY — DX: Atherosclerotic heart disease of native coronary artery without angina pectoris: I25.10

## 2019-04-09 LAB — CBC WITH DIFFERENTIAL (CANCER CENTER ONLY)
Abs Immature Granulocytes: 0.03 10*3/uL (ref 0.00–0.07)
Basophils Absolute: 0 10*3/uL (ref 0.0–0.1)
Basophils Relative: 0 %
Eosinophils Absolute: 0.1 10*3/uL (ref 0.0–0.5)
Eosinophils Relative: 1 %
HCT: 32.5 % — ABNORMAL LOW (ref 39.0–52.0)
Hemoglobin: 10.2 g/dL — ABNORMAL LOW (ref 13.0–17.0)
Immature Granulocytes: 0 %
Lymphocytes Relative: 24 %
Lymphs Abs: 2 10*3/uL (ref 0.7–4.0)
MCH: 27.5 pg (ref 26.0–34.0)
MCHC: 31.4 g/dL (ref 30.0–36.0)
MCV: 87.6 fL (ref 80.0–100.0)
Monocytes Absolute: 0.8 10*3/uL (ref 0.1–1.0)
Monocytes Relative: 9 %
Neutro Abs: 5.4 10*3/uL (ref 1.7–7.7)
Neutrophils Relative %: 66 %
Platelet Count: 79 10*3/uL — ABNORMAL LOW (ref 150–400)
RBC: 3.71 MIL/uL — ABNORMAL LOW (ref 4.22–5.81)
RDW: 15.9 % — ABNORMAL HIGH (ref 11.5–15.5)
WBC Count: 8.2 10*3/uL (ref 4.0–10.5)
nRBC: 0 % (ref 0.0–0.2)

## 2019-04-09 LAB — PROTIME-INR
INR: 3.4 — ABNORMAL HIGH (ref 0.8–1.2)
Prothrombin Time: 34.1 seconds — ABNORMAL HIGH (ref 11.4–15.2)

## 2019-04-09 NOTE — ED Provider Notes (Signed)
Patient was seen here in the emergency department by his CT surgeon who evaluated the area on his arm.  They felt that he could be discharged home and that they would set up a follow-up appointment tomorrow.  The patient has been stable here in the emergency department and will be discharged home at this time.  He does have a hematoma to the left forearm at the area of the graft site.   Dalia Heading, PA-C 04/09/19 1757    Drenda Freeze, MD 04/09/19 [phone]

## 2019-04-09 NOTE — Discharge Instructions (Addendum)
Return here as needed.  Follow-up with your CT surgeon.

## 2019-04-09 NOTE — ED Notes (Signed)
Per MD atkins, pt is cleared to go home. He saw and evaluated pt in the lobby.

## 2019-04-09 NOTE — ED Notes (Signed)
Patient Alert and oriented to baseline. Stable and ambulatory to baseline. Patient verbalized understanding of the discharge instructions.  Patient belongings were taken by the patient.   

## 2019-04-09 NOTE — ED Triage Notes (Signed)
To ED for further eval/treatment of hematoma to left forearm at site of vessel grafting for bypass 3/5. Pt was seen yesterday when left forearm started to swell. Told to elevate and watch - come back if worse or changes. Today pt states swelling is worse and with an area of numbness at wrist. Radial pulse palpable. Sensation in left fingers present and cap refill <3 sec. Will place ice to forearm

## 2019-04-09 NOTE — Assessment & Plan Note (Signed)
Diagnosed when he was in the hospital with NSTEMI status post CABG on 03/06/2019.  Postoperatively platelet count went down to 17,000.  HIT antibody panel was positive. Current treatment: Coumadin  Lab review:

## 2019-04-10 ENCOUNTER — Ambulatory Visit (HOSPITAL_COMMUNITY): Payer: Medicare HMO

## 2019-04-10 ENCOUNTER — Other Ambulatory Visit: Payer: Self-pay | Admitting: Cardiothoracic Surgery

## 2019-04-10 ENCOUNTER — Telehealth: Payer: Self-pay | Admitting: Hematology and Oncology

## 2019-04-10 ENCOUNTER — Telehealth: Payer: Self-pay

## 2019-04-10 DIAGNOSIS — Z952 Presence of prosthetic heart valve: Secondary | ICD-10-CM

## 2019-04-10 NOTE — Telephone Encounter (Signed)
-----   Message from Wonda Olds, MD sent at 04/10/2019  3:57 PM EDT ----- Regarding: RE: re-starting Coumadin? FYI im not here on monday Contact: [phone] Based on how it looked yesterday, I think it's safe to restart tomorrow on a normal schedule for him. Thanks  ----- Message ----- From: Marylen Ponto, LPN Sent: [id number]   3:36 PM EDT To: Laury Deep, RN, Margit Hanks, RN, # Subject: re-starting Coumadin? FYI im not here on mon#  Mrs Rummel called today wanting to know when her husband should restart his coumadin? He saw Tessa on 04/09/19 and was told to hold the coumadin due to a hematoma and his INR was  thin. His last PT/INR check was on 04/09/19 and was 3.4.  He is not schedule for another INR check until 4/14th from home health/ He is scheduled to see you on Monday 04/13/19. Should he continue to hold the coumadin? Until he see's you Monday. Or can he re-start? Please advise Thanks Linden Dolin

## 2019-04-10 NOTE — Telephone Encounter (Signed)
Scheduled per 04/08 los, spoke with patient's wife and patient will be notified.

## 2019-04-13 ENCOUNTER — Ambulatory Visit
Admission: RE | Admit: 2019-04-13 | Discharge: 2019-04-13 | Disposition: A | Discharge status: Home / Self Care | Payer: Medicare HMO | Source: Ambulatory Visit | Attending: Cardiothoracic Surgery | Admitting: Cardiothoracic Surgery

## 2019-04-13 ENCOUNTER — Encounter: Payer: Self-pay | Admitting: Cardiothoracic Surgery

## 2019-04-13 ENCOUNTER — Other Ambulatory Visit: Payer: Self-pay | Admitting: *Deleted

## 2019-04-13 ENCOUNTER — Ambulatory Visit (INDEPENDENT_AMBULATORY_CARE_PROVIDER_SITE_OTHER): Payer: Self-pay | Admitting: Cardiothoracic Surgery

## 2019-04-13 ENCOUNTER — Other Ambulatory Visit: Payer: Self-pay

## 2019-04-13 VITALS — BP 129/79 | HR 66 | Temp 97.2°F | Resp 16 | Ht 67.0 in | Wt 212.7 lb

## 2019-04-13 DIAGNOSIS — Z7901 Long term (current) use of anticoagulants: Secondary | ICD-10-CM | POA: Diagnosis not present

## 2019-04-13 DIAGNOSIS — Z951 Presence of aortocoronary bypass graft: Secondary | ICD-10-CM

## 2019-04-13 DIAGNOSIS — Z952 Presence of prosthetic heart valve: Secondary | ICD-10-CM

## 2019-04-13 DIAGNOSIS — I729 Aneurysm of unspecified site: Secondary | ICD-10-CM

## 2019-04-13 DIAGNOSIS — M810 Age-related osteoporosis without current pathological fracture: Secondary | ICD-10-CM | POA: Diagnosis not present

## 2019-04-13 DIAGNOSIS — Z48812 Encounter for surgical aftercare following surgery on the circulatory system: Secondary | ICD-10-CM | POA: Diagnosis not present

## 2019-04-13 DIAGNOSIS — H539 Unspecified visual disturbance: Secondary | ICD-10-CM | POA: Diagnosis not present

## 2019-04-13 DIAGNOSIS — I739 Peripheral vascular disease, unspecified: Secondary | ICD-10-CM | POA: Diagnosis not present

## 2019-04-13 DIAGNOSIS — M1990 Unspecified osteoarthritis, unspecified site: Secondary | ICD-10-CM | POA: Diagnosis not present

## 2019-04-13 DIAGNOSIS — D649 Anemia, unspecified: Secondary | ICD-10-CM | POA: Diagnosis not present

## 2019-04-13 DIAGNOSIS — I2510 Atherosclerotic heart disease of native coronary artery without angina pectoris: Secondary | ICD-10-CM | POA: Diagnosis not present

## 2019-04-13 DIAGNOSIS — J45909 Unspecified asthma, uncomplicated: Secondary | ICD-10-CM | POA: Diagnosis not present

## 2019-04-13 DIAGNOSIS — J90 Pleural effusion, not elsewhere classified: Secondary | ICD-10-CM | POA: Diagnosis not present

## 2019-04-13 DIAGNOSIS — T81718A Complication of other artery following a procedure, not elsewhere classified, initial encounter: Secondary | ICD-10-CM

## 2019-04-14 DIAGNOSIS — L89312 Pressure ulcer of right buttock, stage 2: Secondary | ICD-10-CM | POA: Diagnosis not present

## 2019-04-14 NOTE — Progress Notes (Signed)
[address]       [place] [address]             [phone]     CARDIOTHORACIC SURGERY OFFICE NOTE  Referring Provider is [name], MD Primary Cardiologist is [name], MD PCP is Jinny Sanders, MD   HPI:  69 yo man underwent urgent AVR/CABG 03/06/19 for heart failure sx. He did well initially but developed HIT without thrombotic complication in the hospital. Ultimately was discharged. Has recently developed LUE hematoma in the bed of radial artery harvest site. No drainage or evidence of poor distal perfusion.    Current Outpatient Medications  Medication Sig Dispense Refill  . ALPRAZolam (XANAX) 0.5 MG tablet TAKE 1 TABLET BY MOUTH ONCE DAILY AS NEEDED FOR ANXIETY (TAKE  30  MINUTES  PRIOR  TO  PLANE  FLIGHT  OR  AS  NEEDED  FOR  ANXIETY) 20 tablet 0  . amiodarone (PACERONE) 200 MG tablet Take 1 tablet (200 mg total) by mouth daily. 30 tablet 1  . atorvastatin (LIPITOR) 80 MG tablet Take 1 tablet (80 mg total) by mouth daily. 30 tablet 3  . cyclobenzaprine (FLEXERIL) 10 MG tablet Take 1 tablet (10 mg total) by mouth at bedtime as needed for muscle spasms. 15 tablet 0  . ketoconazole (NIZORAL) 2 % shampoo Apply 1 application topically 2 (two) times a week. 120 mL 0  . latanoprost (XALATAN) 0.005 % ophthalmic solution Place 1 drop into both eyes every morning.     . metoprolol tartrate (LOPRESSOR) 25 MG tablet Take 0.5 tablets (12.5 mg total) by mouth 2 (two) times daily. 30 tablet 3  . Misc Natural Products (OSTEO BI-FLEX/5-LOXIN ADVANCED PO) Take 2 tablets by mouth daily.     . sertraline (ZOLOFT) 50 MG tablet Take 1 tablet (50 mg total) by mouth daily. 30 tablet 3  . traMADol (ULTRAM) 50 MG tablet Take 1-2 tablets (50-100 mg total) by mouth every 4 (four) hours as needed for moderate pain. 30 tablet 0  . traZODone (DESYREL) 50 MG tablet Take 0.5-1 tablets (25-50 mg total) by mouth at bedtime as needed for sleep. 30 tablet 3  . warfarin  (COUMADIN) 2.5 MG tablet Take 1 tablet (2.5 mg total) by mouth daily. 30 tablet 3   No current facility-administered medications for this visit.      Physical Exam:   BP 129/79 (BP Location: Right Arm)   Pulse 66   Temp (!) 97.2 F (36.2 C)   Resp 16   Ht 5\' 7"  (1.702 m)   Wt 96.5 kg   SpO2 96% Comment: RA  BMI 33.31 kg/m   General:  Well-appearing, NAD  Chest:   cta  CV:   rrr  Incisions:  Well-healed  Extremities:  4x10 cm swelling at proximal aspect of LUE incision, stable, soft  Diagnostic Tests:  CXR with clear lung fields   Impression:  Doing reasonably well after CABG/AVR but has left UE hematoma likely related to over anticoagulation and HIT with severe thrombocytopenia.   Plan:  LUE u/s to evaluate hematoma/seroma Possible OR evacuation of hematoma depending on u/s results.  I spent in excess of 15  minutes during the conduct of this office consultation and >50% of this time involved direct face-to-face encounter with the patient for counseling and/or coordination of their care.  Level 2                 10 minutes Level  3                 15 minutes Level 4                 25 minutes Level 5                 40 minutes  B. Murvin Natal, MD 04/14/2019 4:53 PM

## 2019-04-15 ENCOUNTER — Encounter: Payer: Self-pay | Admitting: Family Medicine

## 2019-04-15 ENCOUNTER — Other Ambulatory Visit: Payer: Self-pay

## 2019-04-15 ENCOUNTER — Ambulatory Visit (INDEPENDENT_AMBULATORY_CARE_PROVIDER_SITE_OTHER): Payer: Medicare HMO | Admitting: Family Medicine

## 2019-04-15 VITALS — BP 120/66 | HR 69 | Temp 97.9°F | Ht 67.0 in

## 2019-04-15 DIAGNOSIS — Z952 Presence of prosthetic heart valve: Secondary | ICD-10-CM

## 2019-04-15 DIAGNOSIS — D7582 Heparin induced thrombocytopenia (HIT): Secondary | ICD-10-CM | POA: Diagnosis not present

## 2019-04-15 DIAGNOSIS — Z951 Presence of aortocoronary bypass graft: Secondary | ICD-10-CM | POA: Insufficient documentation

## 2019-04-15 DIAGNOSIS — Z7901 Long term (current) use of anticoagulants: Secondary | ICD-10-CM | POA: Diagnosis not present

## 2019-04-15 DIAGNOSIS — D75829 Heparin-induced thrombocytopenia, unspecified: Secondary | ICD-10-CM

## 2019-04-15 DIAGNOSIS — M79671 Pain in right foot: Secondary | ICD-10-CM | POA: Insufficient documentation

## 2019-04-15 LAB — URIC ACID: Uric Acid, Serum: 4 mg/dL (ref 4.0–7.8)

## 2019-04-15 LAB — CBC WITH DIFFERENTIAL/PLATELET
Basophils Absolute: 0.1 10*3/uL (ref 0.0–0.1)
Basophils Relative: 0.6 % (ref 0.0–3.0)
Eosinophils Absolute: 0 10*3/uL (ref 0.0–0.7)
Eosinophils Relative: 0.2 % (ref 0.0–5.0)
HCT: 32.1 % — ABNORMAL LOW (ref 39.0–52.0)
Hemoglobin: 10.4 g/dL — ABNORMAL LOW (ref 13.0–17.0)
Lymphocytes Relative: 15.2 % (ref 12.0–46.0)
Lymphs Abs: 1.4 10*3/uL (ref 0.7–4.0)
MCHC: 32.5 g/dL (ref 30.0–36.0)
MCV: 87.4 fl (ref 78.0–100.0)
Monocytes Absolute: 1 10*3/uL (ref 0.1–1.0)
Monocytes Relative: 10.2 % (ref 3.0–12.0)
Neutro Abs: 6.9 10*3/uL (ref 1.4–7.7)
Neutrophils Relative %: 73.8 % (ref 43.0–77.0)
Platelets: 93 10*3/uL — ABNORMAL LOW (ref 150.0–400.0)
RBC: 3.67 Mil/uL — ABNORMAL LOW (ref 4.22–5.81)
RDW: 17.8 % — ABNORMAL HIGH (ref 11.5–15.5)
WBC: 9.3 10*3/uL (ref 4.0–10.5)

## 2019-04-15 LAB — BASIC METABOLIC PANEL
BUN: 14 mg/dL (ref 6–23)
CO2: 25 mEq/L (ref 19–32)
Calcium: 8.5 mg/dL (ref 8.4–10.5)
Chloride: 101 mEq/L (ref 96–112)
Creatinine, Ser: 0.72 mg/dL (ref 0.40–1.50)
GFR: 107.73 mL/min (ref >60.00)
Glucose, Bld: 114 mg/dL — ABNORMAL HIGH (ref 70–99)
Potassium: 3.8 mEq/L (ref 3.5–5.1)
Sodium: 136 mEq/L (ref 135–145)

## 2019-04-15 NOTE — Patient Instructions (Addendum)
I am suspicious for gout flare. Labs today. Restart colchicine, 1 tablet daily until pain has subsided. If not responding to colchicine, let me know and I will send in prednisone taper.   Low-Purine Eating Plan A low-purine eating plan involves making food choices to limit your intake of purine. Purine is a kind of uric acid. Too much uric acid in your blood can cause certain conditions, such as gout and kidney stones. Eating a low-purine diet can help control these conditions. What are tips for following this plan? Reading food labels   Avoid foods with saturated or Trans fat.  Check the ingredient list of grains-based foods, such as bread and cereal, to make sure that they contain whole grains.  Check the ingredient list of sauces or soups to make sure they do not contain meat or fish.  When choosing soft drinks, check the ingredient list to make sure they do not contain high-fructose corn syrup. Shopping  Buy plenty of fresh fruits and vegetables.  Avoid buying canned or fresh fish.  Buy dairy products labeled as low-fat or nonfat.  Avoid buying premade or processed foods. These foods are often high in fat, salt (sodium), and added sugar. Cooking  Use olive oil instead of butter when cooking. Oils like olive oil, canola oil, and sunflower oil contain healthy fats. Meal planning  Learn which foods do or do not affect you. If you find out that a food tends to cause your gout symptoms to flare up, avoid eating that food. You can enjoy foods that do not cause problems. If you have any questions about a food item, talk with your dietitian or health care provider.  Limit foods high in fat, especially saturated fat. Fat makes it harder for your body to get rid of uric acid.  Choose foods that are lower in fat and are lean sources of protein. General guidelines  Limit alcohol intake to no more than 1 drink a day for nonpregnant women and 2 drinks a day for men. One drink equals 12 oz  of beer, 5 oz of wine, or 1 oz of hard liquor. Alcohol can affect the way your body gets rid of uric acid.  Drink plenty of water to keep your urine clear or pale yellow. Fluids can help remove uric acid from your body.  If directed by your health care provider, take a vitamin C supplement.  Work with your health care provider and dietitian to develop a plan to achieve or maintain a healthy weight. Losing weight can help reduce uric acid in your blood. What foods are recommended? The items listed may not be a complete list. Talk with your dietitian about what dietary choices are best for you. Foods low in purines Foods low in purines do not need to be limited. These include:  All fruits.  All low-purine vegetables, pickles, and olives.  Breads, pasta, rice, cornbread, and popcorn. Cake and other baked goods.  All dairy foods.  Eggs, nuts, and nut butters.  Spices and condiments, such as salt, herbs, and vinegar.  Plant oils, butter, and margarine.  Water, sugar-free soft drinks, tea, coffee, and cocoa.  Vegetable-based soups, broths, sauces, and gravies. Foods moderate in purines Foods moderate in purines should be limited to the amounts listed.   cup of asparagus, cauliflower, spinach, mushrooms, or green peas, each day.  2/3 cup uncooked oatmeal, each day.   cup dry wheat bran or wheat germ, each day.  2-3 ounces of meat or poultry, each  day.  4-6 ounces of shellfish, such as crab, lobster, oysters, or shrimp, each day.  1 cup cooked beans, peas, or lentils, each day.  Soup, broths, or bouillon made from meat or fish. Limit these foods as much as possible. What foods are not recommended? The items listed may not be a complete list. Talk with your dietitian about what dietary choices are best for you. Limit your intake of foods high in purines, including:  Beer and other alcohol.  Meat-based gravy or sauce.  Canned or fresh fish, such as: ? Anchovies,  sardines, herring, and tuna. ? Mussels and scallops. ? Codfish, trout, and haddock.  Berniece Salines.  Organ meats, such as: ? Liver or kidney. ? Tripe. ? Sweetbreads (thymus gland or pancreas).  Wild [employer] biochemist.  Yeast or yeast extract supplements.  Drinks sweetened with high-fructose corn syrup. Summary  Eating a low-purine diet can help control conditions caused by too much uric acid in the body, such as gout or kidney stones.  Choose low-purine foods, limit alcohol, and limit foods high in fat.  You will learn over time which foods do or do not affect you. If you find out that a food tends to cause your gout symptoms to flare up, avoid eating that food. This information is not intended to replace advice given to you by your health care provider. Make sure you discuss any questions you have with your health care provider. Document Revised: 11/30/2016 Document Reviewed: 02/01/2016 Elsevier Patient Education  2020 [employer].

## 2019-04-15 NOTE — Assessment & Plan Note (Addendum)
Anticipate diffuse gout flare of R foot, more noticeable at mid sole. Describes mild episode of podagra to L foot last week, that did resolve after 2 days. He was recently on colchicine post-bypass surgery, stopped 2+ wks ago. Avoid NSAID and try to avoid prednisone given recent cardiac history. If colchicine ineffective to control gout, consider prednisone taper (after consulting with thoracic surgery).  rec colchicine 1 a day (BID caused diarrhea), reviewed drug interactions with amiodarone and atorvastatin. He is planning to finish amiodarone dose Monday.  Check labs today (CBC, Cr, urate).

## 2019-04-15 NOTE — Progress Notes (Signed)
This visit was conducted in person.  BP 120/66 (BP Location: Right Arm, Patient Position: Sitting, Cuff Size: Large)   Pulse 69   Temp 97.9 F (36.6 C) (Temporal)   Ht 5\' 7"  (1.702 m)   SpO2 96%   BMI 33.31 kg/m    CC: .bilateral foot swelling Subjective:    Patient ID: Gregory [name], male    DOB: 1950/02/13, 69 y.o.   MRN: [id number]  HPI: Gregory [name] is a 69 y.o. male presenting on 04/15/2019 for Foot Swelling (C/o bilateral foot swelling/pain.  L foot pain started last wk.  Right foot pain/swelling started 2 days ago and is hot to touch.  Pt accompanied by wife, Cindy- temp 97.6.)   2 nights ago R foot started swelling, painful, unable to bear weight. At home had been using rollator then cane, now using wheelchair.  Last week had L 1st great toe pain and swelling with some warmth s/p xray - thoracic surgery provider checked xray and told he had partially dislocated toe ?flared from PT toe lifts.   No fevers/chills, inciting trauma/injury or fall. No h/o gout.   Recent NSTEMI s/p CABG and AV replacement last month, complicated by HIT. On coumadin (for 3 months). To finish amiodarone course next week. Previously on midodrine TID - now off this.   Saw PCP 2 wks ago - at that time no pedal edema noted. He was started on sertraline and trazodone for GAD and insomnia.      Relevant past medical, surgical, family and social history reviewed and updated as indicated. Interim medical history since our last visit reviewed. Allergies and medications reviewed and updated. Outpatient Medications Prior to Visit  Medication Sig Dispense Refill  . ALPRAZolam (XANAX) 0.5 MG tablet TAKE 1 TABLET BY MOUTH ONCE DAILY AS NEEDED FOR ANXIETY (TAKE  30  MINUTES  PRIOR  TO  PLANE  FLIGHT  OR  AS  NEEDED  FOR  ANXIETY) 20 tablet 0  . amiodarone (PACERONE) 200 MG tablet Take 1 tablet (200 mg total) by mouth daily. 30 tablet 1  . atorvastatin (LIPITOR) 80 MG tablet Take 1 tablet (80 mg total)  by mouth daily. 30 tablet 3  . cyclobenzaprine (FLEXERIL) 10 MG tablet Take 1 tablet (10 mg total) by mouth at bedtime as needed for muscle spasms. 15 tablet 0  . ketoconazole (NIZORAL) 2 % shampoo Apply 1 application topically 2 (two) times a week. 120 mL 0  . latanoprost (XALATAN) 0.005 % ophthalmic solution Place 1 drop into both eyes every morning.     . metoprolol tartrate (LOPRESSOR) 25 MG tablet Take 0.5 tablets (12.5 mg total) by mouth 2 (two) times daily. 30 tablet 3  . Misc Natural Products (OSTEO BI-FLEX/5-LOXIN ADVANCED PO) Take 2 tablets by mouth daily.     . sertraline (ZOLOFT) 50 MG tablet Take 1 tablet (50 mg total) by mouth daily. 30 tablet 3  . traMADol (ULTRAM) 50 MG tablet Take 1-2 tablets (50-100 mg total) by mouth every 4 (four) hours as needed for moderate pain. 30 tablet 0  . traZODone (DESYREL) 50 MG tablet Take 0.5-1 tablets (25-50 mg total) by mouth at bedtime as needed for sleep. 30 tablet 3  . warfarin (COUMADIN) 2.5 MG tablet Take 1 tablet (2.5 mg total) by mouth daily. 30 tablet 3  . colchicine 0.6 MG tablet Take 2 tablets on first day of gout flare then 1 tablet daily until gout flare has resolved     No  facility-administered medications prior to visit.     Per HPI unless specifically indicated in ROS section below Review of Systems Objective:    BP 120/66 (BP Location: Right Arm, Patient Position: Sitting, Cuff Size: Large)   Pulse 69   Temp 97.9 F (36.6 C) (Temporal)   Ht 5\' 7"  (1.702 m)   SpO2 96%   BMI 33.31 kg/m   Wt Readings from Last 3 Encounters:  04/13/19 212 lb 11.2 oz (96.5 kg)  04/09/19 214 lb (97.1 kg)  04/09/19 214 lb 12.8 oz (97.4 kg)    Physical Exam Vitals and nursing note reviewed.  Constitutional:      Appearance: Normal appearance. He is not ill-appearing.     Comments: In wheelchair due to R foot pain  Cardiovascular:     Rate and Rhythm: Normal rate and regular rhythm.     Pulses: Normal pulses.     Heart sounds: Murmur  present.  Pulmonary:     Effort: Pulmonary effort is normal. No respiratory distress.     Breath sounds: Normal breath sounds. No wheezing, rhonchi or rales.  Musculoskeletal:        General: Swelling and tenderness present. No deformity. Normal range of motion.     Comments:  1+ DP bilaterally L foot - no residual pain or swelling at 1st MTPJ or throughout foot R foot - tender to light touch along medial ankle into sole, most tender at mid sole. No significant pain or swelling at 1st MTPJ. Edema present to ankle greater than on left, no significant erythema or warmth to foot noted.   Skin:    General: Skin is warm and dry.     Findings: No erythema or rash.  Neurological:     Mental Status: He is alert.  Psychiatric:        Mood and Affect: Mood normal.        Behavior: Behavior normal.       Lab Results  Component Value Date   INR 3.4 (H) 04/09/2019   INR 4.1 (A) 04/08/2019   INR 3.9 (A) 04/03/2019   No results found for: [facility]  Lab Results  Component Value Date   WBC 8.2 04/09/2019   HGB 10.2 (L) 04/09/2019   HCT 32.5 (L) 04/09/2019   MCV 87.6 04/09/2019   PLT 79 (L) 04/09/2019    Lab Results  Component Value Date   CREATININE 1.12 03/17/2019   BUN 29 (H) 03/17/2019   NA 136 03/17/2019   K 4.3 03/17/2019   CL 92 (L) 03/17/2019   CO2 31 03/17/2019   Lab Results  Component Value Date   HGBA1C 5.6 03/05/2019    Assessment & Plan:  This visit occurred during the SARS-CoV-2 public health emergency.  Safety protocols were in place, including screening questions prior to the visit, additional usage of staff PPE, and extensive cleaning of exam room while observing appropriate contact time as indicated for disinfecting solutions.   Problem List Items Addressed This Visit    S/P CABG x 3   S/P aortic valve replacement   Right foot pain - Primary    Anticipate diffuse gout flare of R foot, more noticeable at mid sole. Describes mild episode of podagra to L foot last  week, that did resolve after 2 days. He was recently on colchicine post-bypass surgery, stopped 2+ wks ago. Avoid NSAID and try to avoid prednisone given recent cardiac history. If colchicine ineffective to control gout, consider prednisone taper (after consulting with thoracic  surgery).  rec colchicine 1 a day (BID caused diarrhea), reviewed drug interactions with amiodarone and atorvastatin. He is planning to finish amiodarone dose Monday.  Check labs today (CBC, Cr, urate).       Relevant Orders   Uric acid   CBC with Differential/Platelet   Basic metabolic panel   INR   Long term (current) use of anticoagulants    They are awaiting call from [facility] to set up home monitoring.  Check INR per pt/wife request, forward to cardiology coumadin clinic.       HIT (heparin-induced thrombocytopenia) ([place])       No orders of the defined types were placed in this encounter.  Orders Placed This Encounter  Procedures  . Uric acid  . CBC with Differential/Platelet  . Basic metabolic panel  . INR    Patient instructions: I am suspicious for gout flare. Labs today. Restart colchicine, 1 tablet daily until pain has subsided. If not responding to colchicine, let me know and I will send in prednisone taper.   Follow up plan: Return if symptoms worsen or fail to improve.  Ria Bush, MD

## 2019-04-15 NOTE — Assessment & Plan Note (Addendum)
They are awaiting call from [facility] to set up home monitoring.  Check INR per pt/wife request, forward to cardiology coumadin clinic.

## 2019-04-16 ENCOUNTER — Telehealth: Payer: Self-pay

## 2019-04-16 ENCOUNTER — Telehealth: Payer: Self-pay | Admitting: Pharmacist Clinician (PhC)/ Clinical Pharmacy Specialist

## 2019-04-16 ENCOUNTER — Ambulatory Visit (HOSPITAL_COMMUNITY)
Admission: RE | Admit: 2019-04-16 | Discharge: 2019-04-16 | Disposition: A | Discharge status: Home / Self Care | Payer: Medicare HMO | Source: Ambulatory Visit | Attending: Cardiothoracic Surgery | Admitting: Cardiothoracic Surgery

## 2019-04-16 ENCOUNTER — Ambulatory Visit (INDEPENDENT_AMBULATORY_CARE_PROVIDER_SITE_OTHER): Payer: Medicare HMO

## 2019-04-16 DIAGNOSIS — Z951 Presence of aortocoronary bypass graft: Secondary | ICD-10-CM | POA: Insufficient documentation

## 2019-04-16 DIAGNOSIS — I729 Aneurysm of unspecified site: Secondary | ICD-10-CM | POA: Insufficient documentation

## 2019-04-16 DIAGNOSIS — Z7901 Long term (current) use of anticoagulants: Secondary | ICD-10-CM

## 2019-04-16 DIAGNOSIS — Z952 Presence of prosthetic heart valve: Secondary | ICD-10-CM | POA: Insufficient documentation

## 2019-04-16 DIAGNOSIS — T81718A Complication of other artery following a procedure, not elsewhere classified, initial encounter: Secondary | ICD-10-CM | POA: Insufficient documentation

## 2019-04-16 LAB — POCT INR: INR: 4.1 — AB (ref 2.0–3.0)

## 2019-04-16 NOTE — Patient Instructions (Addendum)
Pre visit review using our clinic review tool, if applicable. No additional management support is needed unless otherwise documented below in the visit note.  Hold warfarin today and tomorrow then continue taking 2.5mg  daily except take 1.25mg  on Mondays and Fridays. Recheck in one week.

## 2019-04-16 NOTE — Telephone Encounter (Signed)
Spoke with patient wife.  His [place] RN visit was cancelled earlier this week, was to have had INR check at same visit.  Because of this, he went to PCP office [place] and had INR done in their coumadin clinic.  INR done by Randall An RN and dosed appropriately.  Wife had asked that they forward INR result to our office as well.    Wife states HH will be at home tomorrow.  LMOM for [facility] Stephanie to call [place] after that visit, so we can determine next visit and arrange INR check.  Stressed that patient does not need INR on Friday.  Wife also states that if [facility] will be a challenge, she is happy to take patient to the [facility] clinic, as it is just 5 minutes from their home.  Will try to continue with [facility] as long as possible, but patient welcome to transition to [facility] at any time.

## 2019-04-16 NOTE — Telephone Encounter (Signed)
Received call from pts wife, Jenny Reichmann, who reports pt was here yesterday and INR was to be checked but they forgot. She is requesting apt today since they are in the area. Placed pt on schedule.

## 2019-04-17 DIAGNOSIS — Z48812 Encounter for surgical aftercare following surgery on the circulatory system: Secondary | ICD-10-CM | POA: Diagnosis not present

## 2019-04-17 DIAGNOSIS — I739 Peripheral vascular disease, unspecified: Secondary | ICD-10-CM | POA: Diagnosis not present

## 2019-04-17 DIAGNOSIS — Z951 Presence of aortocoronary bypass graft: Secondary | ICD-10-CM | POA: Diagnosis not present

## 2019-04-17 DIAGNOSIS — J45909 Unspecified asthma, uncomplicated: Secondary | ICD-10-CM | POA: Diagnosis not present

## 2019-04-17 DIAGNOSIS — Z7901 Long term (current) use of anticoagulants: Secondary | ICD-10-CM | POA: Diagnosis not present

## 2019-04-17 DIAGNOSIS — H539 Unspecified visual disturbance: Secondary | ICD-10-CM | POA: Diagnosis not present

## 2019-04-17 DIAGNOSIS — M810 Age-related osteoporosis without current pathological fracture: Secondary | ICD-10-CM | POA: Diagnosis not present

## 2019-04-17 DIAGNOSIS — D649 Anemia, unspecified: Secondary | ICD-10-CM | POA: Diagnosis not present

## 2019-04-17 DIAGNOSIS — M1990 Unspecified osteoarthritis, unspecified site: Secondary | ICD-10-CM | POA: Diagnosis not present

## 2019-04-17 DIAGNOSIS — I2510 Atherosclerotic heart disease of native coronary artery without angina pectoris: Secondary | ICD-10-CM | POA: Diagnosis not present

## 2019-04-20 ENCOUNTER — Telehealth: Payer: Self-pay | Admitting: Cardiology

## 2019-04-20 DIAGNOSIS — Z48812 Encounter for surgical aftercare following surgery on the circulatory system: Secondary | ICD-10-CM | POA: Diagnosis not present

## 2019-04-20 DIAGNOSIS — J45909 Unspecified asthma, uncomplicated: Secondary | ICD-10-CM | POA: Diagnosis not present

## 2019-04-20 DIAGNOSIS — Z7901 Long term (current) use of anticoagulants: Secondary | ICD-10-CM | POA: Diagnosis not present

## 2019-04-20 DIAGNOSIS — Z951 Presence of aortocoronary bypass graft: Secondary | ICD-10-CM | POA: Diagnosis not present

## 2019-04-20 DIAGNOSIS — D649 Anemia, unspecified: Secondary | ICD-10-CM | POA: Diagnosis not present

## 2019-04-20 DIAGNOSIS — M1990 Unspecified osteoarthritis, unspecified site: Secondary | ICD-10-CM | POA: Diagnosis not present

## 2019-04-20 DIAGNOSIS — M810 Age-related osteoporosis without current pathological fracture: Secondary | ICD-10-CM | POA: Diagnosis not present

## 2019-04-20 DIAGNOSIS — H539 Unspecified visual disturbance: Secondary | ICD-10-CM | POA: Diagnosis not present

## 2019-04-20 DIAGNOSIS — I2510 Atherosclerotic heart disease of native coronary artery without angina pectoris: Secondary | ICD-10-CM | POA: Diagnosis not present

## 2019-04-20 DIAGNOSIS — I739 Peripheral vascular disease, unspecified: Secondary | ICD-10-CM | POA: Diagnosis not present

## 2019-04-20 NOTE — Telephone Encounter (Signed)
Left detailed message on voicemail with verbal order to draw INR on 4/21.

## 2019-04-20 NOTE — Telephone Encounter (Signed)
New Message   Per Tillie Rung need verbal order to draw an inr from the patient on 04/22/2019. Please call to discuss.

## 2019-04-20 NOTE — Telephone Encounter (Signed)
Please arrange for the INR on 4/21.  thanks

## 2019-04-22 ENCOUNTER — Ambulatory Visit (INDEPENDENT_AMBULATORY_CARE_PROVIDER_SITE_OTHER): Payer: Medicare HMO | Admitting: Cardiology

## 2019-04-22 ENCOUNTER — Telehealth: Payer: Self-pay

## 2019-04-22 DIAGNOSIS — M1990 Unspecified osteoarthritis, unspecified site: Secondary | ICD-10-CM | POA: Diagnosis not present

## 2019-04-22 DIAGNOSIS — H539 Unspecified visual disturbance: Secondary | ICD-10-CM | POA: Diagnosis not present

## 2019-04-22 DIAGNOSIS — Z951 Presence of aortocoronary bypass graft: Secondary | ICD-10-CM | POA: Diagnosis not present

## 2019-04-22 DIAGNOSIS — Z7901 Long term (current) use of anticoagulants: Secondary | ICD-10-CM | POA: Diagnosis not present

## 2019-04-22 DIAGNOSIS — D7582 Heparin induced thrombocytopenia (HIT): Secondary | ICD-10-CM

## 2019-04-22 DIAGNOSIS — D649 Anemia, unspecified: Secondary | ICD-10-CM | POA: Diagnosis not present

## 2019-04-22 DIAGNOSIS — Z952 Presence of prosthetic heart valve: Secondary | ICD-10-CM

## 2019-04-22 DIAGNOSIS — M810 Age-related osteoporosis without current pathological fracture: Secondary | ICD-10-CM | POA: Diagnosis not present

## 2019-04-22 DIAGNOSIS — I4891 Unspecified atrial fibrillation: Secondary | ICD-10-CM

## 2019-04-22 DIAGNOSIS — Z48812 Encounter for surgical aftercare following surgery on the circulatory system: Secondary | ICD-10-CM | POA: Diagnosis not present

## 2019-04-22 DIAGNOSIS — J45909 Unspecified asthma, uncomplicated: Secondary | ICD-10-CM | POA: Diagnosis not present

## 2019-04-22 DIAGNOSIS — D75829 Heparin-induced thrombocytopenia, unspecified: Secondary | ICD-10-CM

## 2019-04-22 DIAGNOSIS — I739 Peripheral vascular disease, unspecified: Secondary | ICD-10-CM | POA: Diagnosis not present

## 2019-04-22 DIAGNOSIS — I2510 Atherosclerotic heart disease of native coronary artery without angina pectoris: Secondary | ICD-10-CM | POA: Diagnosis not present

## 2019-04-22 LAB — POCT INR: INR: 3.2 — AB (ref 2.0–3.0)

## 2019-04-22 NOTE — Telephone Encounter (Signed)
*  immobilizing sling.    Thanks Manuela Schwartz : )

## 2019-04-22 NOTE — Telephone Encounter (Signed)
Mr Cepin called this Am about continued tenderness to the touch at Rt Radial harvest site. There is pain with reaching and stretching arm and the knot/swelling seems to be getting harder. He had a ultrasound to r/o DVT and pseudoaneurysm. Both negative. Notified on-call PA/ Johann Capers and she reviewed Patient's information and testing. Patient was instructed to take Tylenol for pain prn./ alterate ice and heat to the area./ Wrap arm with ace bandage or can use a embolizing sling . If his sx's to do not improve by Friday this week to call back for an appointment.

## 2019-04-23 ENCOUNTER — Encounter: Payer: Self-pay | Admitting: Podiatry

## 2019-04-23 ENCOUNTER — Other Ambulatory Visit: Payer: Self-pay

## 2019-04-23 ENCOUNTER — Ambulatory Visit: Payer: Medicare HMO | Admitting: Podiatry

## 2019-04-23 VITALS — BP 151/74 | HR 60 | Temp 98.6°F

## 2019-04-23 DIAGNOSIS — B351 Tinea unguium: Secondary | ICD-10-CM

## 2019-04-23 DIAGNOSIS — D689 Coagulation defect, unspecified: Secondary | ICD-10-CM | POA: Insufficient documentation

## 2019-04-23 DIAGNOSIS — M79674 Pain in right toe(s): Secondary | ICD-10-CM | POA: Diagnosis not present

## 2019-04-23 DIAGNOSIS — M79675 Pain in left toe(s): Secondary | ICD-10-CM

## 2019-04-23 DIAGNOSIS — I890 Lymphedema, not elsewhere classified: Secondary | ICD-10-CM

## 2019-04-23 NOTE — Progress Notes (Signed)
This patient returns to my office for at risk foot care.  This patient requires this care by a professional since this patient will be at risk due to having lymphedema, and coagulation defect.  Patient is taking coumadin.  This patient is unable to cut nails himself since the patient cannot reach his nails.These nails are painful walking and wearing shoes.  This patient presents for at risk foot care today.  General Appearance  Alert, conversant and in no acute stress.  Vascular  Dorsalis pedis and posterior tibial  pulses are weakly  palpable due to swelling both feet. bilaterally.  Capillary return is within normal limits  bilaterally. Temperature is within normal limits  bilaterally.  Neurologic  Senn-Weinstein monofilament wire test within normal limits  bilaterally. Muscle power within normal limits bilaterally.  Nails Thick disfigured discolored nails with subungual debris  Hallux nails  B/L.  Pincer nails hallux  B/L. No evidence of bacterial infection or drainage bilaterally.  Orthopedic  No limitations of motion  feet .  No crepitus or effusions noted.  No bony pathology or digital deformities noted.  Skin  normotropic skin with no porokeratosis noted bilaterally.  No signs of infections or ulcers noted.     Onychomycosis  Pain in right toes  Pain in left toes  Consent was obtained for treatment procedures.   Mechanical debridement of nails 1-5  bilaterally performed with a nail nipper.  Filed with dremel without incident.    Return office visit    3 months                  Told patient to return for periodic foot care and evaluation due to potential at risk complications.   Gardiner Barefoot DPM

## 2019-04-30 ENCOUNTER — Other Ambulatory Visit: Payer: Self-pay

## 2019-04-30 ENCOUNTER — Ambulatory Visit (INDEPENDENT_AMBULATORY_CARE_PROVIDER_SITE_OTHER): Payer: Medicare HMO

## 2019-04-30 DIAGNOSIS — Z7901 Long term (current) use of anticoagulants: Secondary | ICD-10-CM | POA: Diagnosis not present

## 2019-04-30 LAB — POCT INR: INR: 5 — AB (ref 2.0–3.0)

## 2019-04-30 NOTE — Patient Instructions (Addendum)
Pre visit review using our clinic review tool, if applicable. No additional management support is needed unless otherwise documented below in the visit note.  Hold dose today and hold dose tomorrow. Change weekly dosing to 1.25mg  daily except 2.5mg  on Tues and Sat. Recheck in 1 wk.

## 2019-05-01 ENCOUNTER — Telehealth (INDEPENDENT_AMBULATORY_CARE_PROVIDER_SITE_OTHER): Payer: Medicare HMO | Admitting: Family Medicine

## 2019-05-01 ENCOUNTER — Encounter: Payer: Self-pay | Admitting: Family Medicine

## 2019-05-01 DIAGNOSIS — F411 Generalized anxiety disorder: Secondary | ICD-10-CM

## 2019-05-01 DIAGNOSIS — R69 Illness, unspecified: Secondary | ICD-10-CM | POA: Diagnosis not present

## 2019-05-01 NOTE — Progress Notes (Signed)
VIRTUAL VISIT Due to national recommendations of social distancing due to [place] 19, a virtual visit is felt to be most appropriate for this patient at this time.   I connected with the patient on 05/01/19 at  2:00 PM EDT by virtual telehealth platform and verified that I am speaking with the correct person using two identifiers.   I discussed the limitations, risks, security and privacy concerns of performing an evaluation and management service by  virtual telehealth platform and the availability of in person appointments. I also discussed with the patient that there may be a patient responsible charge related to this service. The patient expressed understanding and agreed to proceed.  Patient location: Home Provider Location: [facility] Participants: Gregory [name] and [name]   Chief Complaint  Patient presents with  . Follow-up    Anxiety    History of Present Illness:   69 year old male presents for follow up on GAD.   At last appt 4 weeks ago he was started on sertraline 50 mg daily and trazodone for sleep.  He has noted improvement in anxiety , worry. He is more motivated.  He is sleeping 4-6 hours at night.  Still issue staying asleep. He has not been using the trazodone... not sure it worked.   80-85 % improvement overall.  no SE. GAD 7 : Generalized Anxiety Score 05/01/2019 03/31/2019 10/30/2016  Nervous, Anxious, on Edge 0 3 0  Control/stop worrying 1 3 1   Worry too much - different things 1 3 1   Trouble relaxing 0 3 0  Restless 1 2 1   Easily annoyed or irritable 0 2 1  Afraid - awful might happen 0 2 0  Total GAD 7 Score 3 18 4   Anxiety Difficulty Not difficult at all - -      COVID 19 screen No recent travel or known exposure to [place] The patient denies respiratory symptoms of COVID 19 at this time.  The importance of social distancing was discussed today.   ROS    Past Medical History:  Diagnosis Date  . Alcohol abuse   . Aortic valve  disease    AI and AS, Moderate  . Chest pain 03/2019  . Coronary artery disease   . Drug use   . Edema of both lower extremities   . Glaucoma   . Hypertension   . OSA (obstructive sleep apnea)   . Shingles     reports that he has never smoked. He has never used smokeless tobacco. He reports current alcohol use of about 5.0 standard drinks of alcohol per week. He reports that he does not use drugs.   Current Outpatient Medications:  .  ALPRAZolam (XANAX) 0.5 MG tablet, TAKE 1 TABLET BY MOUTH ONCE DAILY AS NEEDED FOR ANXIETY (TAKE  30  MINUTES  PRIOR  TO  PLANE  FLIGHT  OR  AS  NEEDED  FOR  ANXIETY), Disp: 20 tablet, Rfl: 0 .  atorvastatin (LIPITOR) 80 MG tablet, Take 1 tablet (80 mg total) by mouth daily., Disp: 30 tablet, Rfl: 3 .  colchicine 0.6 MG tablet, Take 2 tablets on first day of gout flare then 1 tablet daily until gout flare has resolved, Disp: , Rfl:  .  cyclobenzaprine (FLEXERIL) 10 MG tablet, Take 1 tablet (10 mg total) by mouth at bedtime as needed for muscle spasms., Disp: 15 tablet, Rfl: 0 .  ketoconazole (NIZORAL) 2 % shampoo, Apply 1 application topically 2 (two) times a week., Disp:  120 mL, Rfl: 0 .  latanoprost (XALATAN) 0.005 % ophthalmic solution, Place 1 drop into both eyes every morning. , Disp: , Rfl:  .  metoprolol tartrate (LOPRESSOR) 25 MG tablet, Take 0.5 tablets (12.5 mg total) by mouth 2 (two) times daily., Disp: 30 tablet, Rfl: 3 .  Misc Natural Products (OSTEO BI-FLEX/5-LOXIN ADVANCED PO), Take 2 tablets by mouth daily. , Disp: , Rfl:  .  sertraline (ZOLOFT) 50 MG tablet, Take 1 tablet (50 mg total) by mouth daily., Disp: 30 tablet, Rfl: 3 .  traMADol (ULTRAM) 50 MG tablet, Take 1-2 tablets (50-100 mg total) by mouth every 4 (four) hours as needed for moderate pain., Disp: 30 tablet, Rfl: 0 .  traZODone (DESYREL) 50 MG tablet, Take 0.5-1 tablets (25-50 mg total) by mouth at bedtime as needed for sleep., Disp: 30 tablet, Rfl: 3 .  warfarin (COUMADIN) 2.5 MG  tablet, Take 1 tablet (2.5 mg total) by mouth daily., Disp: 30 tablet, Rfl: 3   Observations/Objective: Temperature 97.7 F (36.5 C), temperature source Temporal, height 5\' 7"  (1.702 m), weight 209 lb (94.8 kg).  Physical Exam  Physical Exam Constitutional:      General: The patient is not in acute distress. Pulmonary:     Effort: Pulmonary effort is normal. No respiratory distress.  Neurological:     Mental Status: The patient is alert and oriented to person, place, and time.  Psychiatric:        Mood and Affect: Mood normal.        Behavior: Behavior normal.   Assessment and Plan   GAD (generalized anxiety disorder) Improved control on sertrlaine 50 mg daily.. 85% improvement. Still some issues with sleep.. can take sertraline at bedtime or try higher dose or more consistent trazodone.  Follow up in 6 months or at next wellness.   I discussed the assessment and treatment plan with the patient. The patient was provided an opportunity to ask questions and all were answered. The patient agreed with the plan and demonstrated an understanding of the instructions.   The patient was advised to call back or seek an in-person evaluation if the symptoms worsen or if the condition fails to improve as anticipated.     Gregory Lofts, MD

## 2019-05-01 NOTE — Assessment & Plan Note (Signed)
Improved control on sertrlaine 50 mg daily.. 85% improvement. Still some issues with sleep.. can take sertraline at bedtime or try higher dose or more consistent trazodone.  Follow up in 6 months or at next wellness.

## 2019-05-04 ENCOUNTER — Ambulatory Visit (INDEPENDENT_AMBULATORY_CARE_PROVIDER_SITE_OTHER): Payer: Self-pay | Admitting: Surgical

## 2019-05-04 ENCOUNTER — Other Ambulatory Visit: Payer: Self-pay

## 2019-05-04 ENCOUNTER — Telehealth (HOSPITAL_COMMUNITY): Payer: Self-pay

## 2019-05-04 VITALS — BP 148/75 | HR 65 | Temp 97.6°F | Resp 20 | Ht 67.0 in | Wt 209.0 lb

## 2019-05-04 DIAGNOSIS — Z951 Presence of aortocoronary bypass graft: Secondary | ICD-10-CM

## 2019-05-04 DIAGNOSIS — T148XXA Other injury of unspecified body region, initial encounter: Secondary | ICD-10-CM

## 2019-05-04 NOTE — Telephone Encounter (Signed)
Patient wife called and stated he was interested in participating in the Cardiac Rehab Program. Patient will come in for orientation on 05/26/2019@10 :00am and will attend the 11:15am exercise class.  Mailed homework package.

## 2019-05-04 NOTE — Progress Notes (Signed)
[address]       [place] [address]             [phone]           [address]       ,[place] [address]             [phone]      [place] Medical Record [id number] Date of Birth: September 12, 1950  Referring: [name], MD Primary Care: Jinny Sanders, MD Primary Cardiologist: [name], MD   Chief Complaint:   POST OP FOLLOW UP CARDIOTHORACIC SURGERY OPERATIVE NOTE  Date of Procedure:    03/06/2019  Preoperative Diagnosis:      Severe 2-vessel Coronary Artery Disease and severe aortic valve disease  Postoperative Diagnosis:    Same  Procedure:        Aortic Valve replacement (25 mm [name] Intuity bovine bioprosthetic)  Coronary Artery Bypass Grafting x 3             Left Internal Mammary Artery to Distal Left Anterior Descending Coronary Artery; left radial artery to Posterior Descending Coronary Artery; pedicled RIMA to rigth posterolateral artery. Open left radial artery Harvest; bilateral IMA harvesting Completion graft surveillance with indocyanine green fluorescence imaging (SPY);  Multilevel rib block with exparel/Marcaine solution  Surgeon:        B. Murvin Natal, MD  Assistant:       Macarthur Critchley PA-C  Anesthesia:    get  Operative Findings: ? Mildly reduced left ventricular systolic function ? good quality internal mammary artery conduits ? good quality radial artery conduit ? good quality target vessels for grafting ? Well-seated aortic valve prosthesis History of Present Illness:    [name] is a 69 year old male status post the above described procedure who has been dealing with a left arm hematoma associated with his radial artery harvest site.  He is in the office today's date to have it rechecked for current status.  He has that today it is more firm and somewhat larger in the proximal portion of the incision.  He is not having any difficulty with pain or  numbness in the hand.  He is not having cool fingers.  There has been no evidence of erythema or drainage.  The discomfort is only minor.  He does have been heparin-induced thrombocytopenia and follow-up with hematology later this week.      Past Medical History:  Diagnosis Date  . Alcohol abuse   . Aortic valve disease    AI and AS, Moderate  . Chest pain 03/2019  . Coronary artery disease   . Drug use   . Edema of both lower extremities   . Glaucoma   . Hypertension   . OSA (obstructive sleep apnea)   . Shingles      Social History   Tobacco Use  Smoking Status Never Smoker  Smokeless Tobacco Never Used    Social History   Substance and Sexual Activity  Alcohol Use Yes  . Alcohol/week: 5.0 standard drinks  . Types: 5 Cans of beer per week     Allergies  Allergen Reactions  . Heparin     HIT antibody and SRA positive    Current Outpatient Medications  Medication Sig Dispense Refill  . ALPRAZolam (XANAX) 0.5 MG tablet TAKE 1 TABLET BY MOUTH ONCE DAILY AS NEEDED FOR ANXIETY (TAKE  30  MINUTES  PRIOR  TO  PLANE  FLIGHT  OR  AS  NEEDED  FOR  ANXIETY) 20 tablet 0  . atorvastatin (LIPITOR) 80 MG tablet Take 1 tablet (80 mg total) by mouth daily. 30 tablet 3  . colchicine 0.6 MG tablet Take 2 tablets on first day of gout flare then 1 tablet daily until gout flare has resolved    . cyclobenzaprine (FLEXERIL) 10 MG tablet Take 1 tablet (10 mg total) by mouth at bedtime as needed for muscle spasms. 15 tablet 0  . ketoconazole (NIZORAL) 2 % shampoo Apply 1 application topically 2 (two) times a week. 120 mL 0  . latanoprost (XALATAN) 0.005 % ophthalmic solution Place 1 drop into both eyes every morning.     . metoprolol tartrate (LOPRESSOR) 25 MG tablet Take 0.5 tablets (12.5 mg total) by mouth 2 (two) times daily. 30 tablet 3  . Misc Natural Products (OSTEO BI-FLEX/5-LOXIN ADVANCED PO) Take 2 tablets by mouth daily.     . sertraline (ZOLOFT) 50 MG tablet Take 1 tablet (50 mg  total) by mouth daily. 30 tablet 3  . traMADol (ULTRAM) 50 MG tablet Take 1-2 tablets (50-100 mg total) by mouth every 4 (four) hours as needed for moderate pain. 30 tablet 0  . traZODone (DESYREL) 50 MG tablet Take 0.5-1 tablets (25-50 mg total) by mouth at bedtime as needed for sleep. 30 tablet 3  . warfarin (COUMADIN) 2.5 MG tablet Take 1 tablet (2.5 mg total) by mouth daily. 30 tablet 3   No current facility-administered medications for this visit.       Physical Exam: BP (!) 148/75   Pulse 65   Temp 97.6 F (36.4 C) (Skin)   Resp 20   Ht 5\' 7"  (1.702 m)   Wt 209 lb (94.8 kg)   SpO2 95% Comment: RA  BMI 32.73 kg/m   Left forearm incision: Well-healed with a moderate sized hematoma in the proximal portion of the incision.  Fingers are warm.  Ulnar pulses intact.  No sensory changes.  No evidence of cellulitis.   Diagnostic Studies & Laboratory data:     Recent Radiology Findings:   No results found.    Recent Lab Findings: Lab Results  Component Value Date   WBC 9.3 04/15/2019   HGB 10.4 Repeated and verified X2. (L) 04/15/2019   HCT 32.1 (L) 04/15/2019   PLT 93.0 (L) 04/15/2019   GLUCOSE 114 (H) 04/15/2019   CHOL 161 08/04/2018   TRIG 105 08/04/2018   HDL 66 08/04/2018   LDLDIRECT 151.0 01/05/2016   LDLCALC 74 08/04/2018   ALT 16 08/04/2018   AST 18 08/04/2018   NA 136 04/15/2019   K 3.8 04/15/2019   CL 101 04/15/2019   CREATININE 0.72 04/15/2019   BUN 14 04/15/2019   CO2 25 04/15/2019   TSH 2.560 08/14/2018   INR 5.0 (A) 04/30/2019   HGBA1C 5.6 03/05/2019      Assessment / Plan: Moderate-sized left forearm hematoma associated with elevated INR and previous radial artery harvest.  There does not appear to be any specific concerns that would require surgical intervention.  Watchful waiting is warranted.  We did discuss heat and local massage.  Should there be any evidence of infection or neurovascular compromise thinking may change in this regard.  Dr.  Orvan Seen observe the findings and determined management plan.      Medication Changes: No orders of the defined types were placed in this encounter.     John Giovanni, PA-C 05/04/2019 2:44 PM

## 2019-05-06 NOTE — Progress Notes (Signed)
Patient Care Team: Jinny Sanders, MD as PCP - General (Family Medicine) [name], MD as PCP - Cardiology (Cardiology) Lendon Colonel, NP as Nurse Practitioner (Cardiology)  DIAGNOSIS:    ICD-10-CM   1. HIT (heparin-induced thrombocytopenia) (HCC)  D75.82     CHIEF COMPLIANT: Follow-up of heparin-induced thrombocytopenia   INTERVAL HISTORY: Gregory [name] is a 69 y.o. with above-mentioned history of Heparin-inducedthrombocytopenia who is currently on anticoagulation with Coumadin. He presents to the clinic todayfor follow-up.  He continues to have the left arm hematoma.  This is not going to be surgically removed.  He is currently on Coumadin and his INR today is 2.2.  ALLERGIES:  is allergic to heparin.  MEDICATIONS:  Current Outpatient Medications  Medication Sig Dispense Refill  . ALPRAZolam (XANAX) 0.5 MG tablet TAKE 1 TABLET BY MOUTH ONCE DAILY AS NEEDED FOR ANXIETY (TAKE  30  MINUTES  PRIOR  TO  PLANE  FLIGHT  OR  AS  NEEDED  FOR  ANXIETY) 20 tablet 0  . atorvastatin (LIPITOR) 80 MG tablet Take 1 tablet (80 mg total) by mouth daily. 30 tablet 3  . colchicine 0.6 MG tablet Take 2 tablets on first day of gout flare then 1 tablet daily until gout flare has resolved    . cyclobenzaprine (FLEXERIL) 10 MG tablet Take 1 tablet (10 mg total) by mouth at bedtime as needed for muscle spasms. 15 tablet 0  . ketoconazole (NIZORAL) 2 % shampoo Apply 1 application topically 2 (two) times a week. 120 mL 0  . latanoprost (XALATAN) 0.005 % ophthalmic solution Place 1 drop into both eyes every morning.     . metoprolol tartrate (LOPRESSOR) 25 MG tablet Take 0.5 tablets (12.5 mg total) by mouth 2 (two) times daily. 30 tablet 3  . Misc Natural Products (OSTEO BI-FLEX/5-LOXIN ADVANCED PO) Take 2 tablets by mouth daily.     . sertraline (ZOLOFT) 50 MG tablet Take 1 tablet (50 mg total) by mouth daily. 30 tablet 3  . traMADol (ULTRAM) 50 MG tablet Take 1-2 tablets (50-100 mg total)  by mouth every 4 (four) hours as needed for moderate pain. 30 tablet 0  . traZODone (DESYREL) 50 MG tablet Take 0.5-1 tablets (25-50 mg total) by mouth at bedtime as needed for sleep. 30 tablet 3  . warfarin (COUMADIN) 2.5 MG tablet Take 1 tablet (2.5 mg total) by mouth daily. 30 tablet 3   No current facility-administered medications for this visit.    PHYSICAL EXAMINATION: ECOG PERFORMANCE STATUS: 2 - Symptomatic, <50% confined to bed  Vitals:   05/07/19 1037  BP: 122/68  Pulse: (!) 56  Resp: 18  Temp: 98.5 F (36.9 C)  SpO2: 96%   Filed Weights   05/07/19 1037  Weight: 212 lb (96.2 kg)    LABORATORY DATA:  I have reviewed the data as listed CMP Latest Ref Rng & Units 04/15/2019 03/17/2019 03/16/2019  Glucose 70 - 99 mg/dL 114(H) 106(H) 96  BUN 6 - 23 mg/dL 14 29(H) 25(H)  Creatinine 0.40 - 1.50 mg/dL 0.72 1.12 1.03  Sodium 135 - 145 mEq/L 136 136 139  Potassium 3.5 - 5.1 mEq/L 3.8 4.3 4.1  Chloride 96 - 112 mEq/L 101 92(L) 94(L)  CO2 19 - 32 mEq/L 25 31 34(H)  Calcium 8.4 - 10.5 mg/dL 8.5 8.1(L) 8.2(L)  Total Protein 6.0 - 8.5 g/dL - - -  Total Bilirubin 0.0 - 1.2 mg/dL - - -  Alkaline Phos 39 - 117 IU/L - - -  AST 0 - 40 IU/L - - -  ALT 0 - 44 IU/L - - -    Lab Results  Component Value Date   WBC 5.0 05/07/2019   HGB 10.4 (L) 05/07/2019   HCT 34.3 (L) 05/07/2019   MCV 94.0 05/07/2019   PLT 150 05/07/2019   NEUTROABS 2.7 05/07/2019    ASSESSMENT & PLAN:  HIT (heparin-induced thrombocytopenia) (HCC) Diagnosed when he was in the hospital with NSTEMI status post CABG on 03/06/2019.  Postoperatively platelet count went down to 17,000.  HIT antibody panel was positive. Current treatment: Coumadin (on hold)  Lab review: 04/15/2019: Platelets 93 03/26/2019: Platelet count 31 04/16/2019 ultrasound left arm: Left forearm hematoma  Today's labs 05/07/2019: Platelets 150 Based on normalization of platelets, we can safely see him on an as-needed basis. He will continue  Coumadin until August 06, 2019.    No orders of the defined types were placed in this encounter.  The patient has a good understanding of the overall plan. he agrees with it. he will call with any problems that may develop before the next visit here.  Total time spent: 20 mins including face to face time and time spent for planning, charting and coordination of care  Nicholas Lose, MD 05/07/2019  I, Cloyde Reams Dorshimer, am acting as scribe for Dr. Nicholas Lose.  I have reviewed the above documentation for accuracy and completeness, and I agree with the above.

## 2019-05-07 ENCOUNTER — Inpatient Hospital Stay: Payer: Medicare HMO | Admitting: Hematology and Oncology

## 2019-05-07 ENCOUNTER — Ambulatory Visit: Payer: Medicare HMO

## 2019-05-07 ENCOUNTER — Other Ambulatory Visit: Payer: Self-pay

## 2019-05-07 ENCOUNTER — Ambulatory Visit (INDEPENDENT_AMBULATORY_CARE_PROVIDER_SITE_OTHER): Payer: Medicare HMO

## 2019-05-07 ENCOUNTER — Inpatient Hospital Stay: Payer: Medicare HMO | Attending: Hematology and Oncology

## 2019-05-07 DIAGNOSIS — Z79899 Other long term (current) drug therapy: Secondary | ICD-10-CM | POA: Diagnosis not present

## 2019-05-07 DIAGNOSIS — D7582 Heparin induced thrombocytopenia (HIT): Secondary | ICD-10-CM | POA: Insufficient documentation

## 2019-05-07 DIAGNOSIS — I252 Old myocardial infarction: Secondary | ICD-10-CM | POA: Insufficient documentation

## 2019-05-07 DIAGNOSIS — D75829 Heparin-induced thrombocytopenia, unspecified: Secondary | ICD-10-CM

## 2019-05-07 DIAGNOSIS — Z951 Presence of aortocoronary bypass graft: Secondary | ICD-10-CM | POA: Diagnosis not present

## 2019-05-07 DIAGNOSIS — Z7901 Long term (current) use of anticoagulants: Secondary | ICD-10-CM | POA: Insufficient documentation

## 2019-05-07 DIAGNOSIS — I4891 Unspecified atrial fibrillation: Secondary | ICD-10-CM

## 2019-05-07 DIAGNOSIS — Z952 Presence of prosthetic heart valve: Secondary | ICD-10-CM

## 2019-05-07 DIAGNOSIS — S40022A Contusion of left upper arm, initial encounter: Secondary | ICD-10-CM

## 2019-05-07 LAB — PROTIME-INR
INR: 2.2 — ABNORMAL HIGH (ref 0.8–1.2)
Prothrombin Time: 23.5 seconds — ABNORMAL HIGH (ref 11.4–15.2)

## 2019-05-07 LAB — CBC WITH DIFFERENTIAL (CANCER CENTER ONLY)
Abs Immature Granulocytes: 0.01 10*3/uL (ref 0.00–0.07)
Basophils Absolute: 0 10*3/uL (ref 0.0–0.1)
Basophils Relative: 0 %
Eosinophils Absolute: 0.1 10*3/uL (ref 0.0–0.5)
Eosinophils Relative: 2 %
HCT: 34.3 % — ABNORMAL LOW (ref 39.0–52.0)
Hemoglobin: 10.4 g/dL — ABNORMAL LOW (ref 13.0–17.0)
Immature Granulocytes: 0 %
Lymphocytes Relative: 32 %
Lymphs Abs: 1.6 10*3/uL (ref 0.7–4.0)
MCH: 28.5 pg (ref 26.0–34.0)
MCHC: 30.3 g/dL (ref 30.0–36.0)
MCV: 94 fL (ref 80.0–100.0)
Monocytes Absolute: 0.6 10*3/uL (ref 0.1–1.0)
Monocytes Relative: 11 %
Neutro Abs: 2.7 10*3/uL (ref 1.7–7.7)
Neutrophils Relative %: 55 %
Platelet Count: 150 10*3/uL (ref 150–400)
RBC: 3.65 MIL/uL — ABNORMAL LOW (ref 4.22–5.81)
RDW: 16.9 % — ABNORMAL HIGH (ref 11.5–15.5)
WBC Count: 5 10*3/uL (ref 4.0–10.5)
nRBC: 0 % (ref 0.0–0.2)

## 2019-05-07 LAB — POCT INR: INR: 2.2 (ref 2.0–3.0)

## 2019-05-07 NOTE — Patient Instructions (Addendum)
Pre visit review using our clinic review tool, if applicable. No additional management support is needed unless otherwise documented below in the visit note.  Pt had an oncology apt today where INR was checked. Pt's wife, Coralyn Mark, reports the INR by telephone and asks if pt has to come in today. Advised pt did not need to come in.  Advised to continue taking 1.25 daily except take 2.5mg  on Tues and Sat. Recheck in 3 wks.  Cindi verbalized understanding. She denied wanting AVS mailed. Advised if any changes to contact office.

## 2019-05-07 NOTE — Assessment & Plan Note (Signed)
Diagnosed when he was in the hospital with NSTEMI status post CABG on 03/06/2019.  Postoperatively platelet count went down to 17,000.  HIT antibody panel was positive. Current treatment: Coumadin (on hold)  Lab review: 04/15/2019: Platelets 93 03/26/2019: Platelet count 31 04/16/2019 ultrasound left arm: Left forearm hematoma  Today's labs 05/07/2019:

## 2019-05-13 ENCOUNTER — Ambulatory Visit: Payer: Medicare HMO | Admitting: Podiatry

## 2019-05-19 NOTE — Progress Notes (Signed)
Cardiology Office Note   Date:  05/20/2019   ID:  Gregory, [name] 10-27-1950, MRN [id number]  PCP:  Jinny Sanders, MD  Cardiologist:   [name], MD   Chief Complaint  Patient presents with  . Coronary Artery Disease      History of Present Illness: Gregory [name] is a 69 y.o. male for follow up of CAD/CABG and AVR.   He had a cardiac catheterization during recent hospitalization on 03/04/2019 which revealed severe calcific disease in the mid LAD, the circumflex had mild to moderate proximal stenosis, the RCA, a large dominant vessel, had severe proximal stenosis and severe mid stenosis with poor flow into the distal vessel.  He was also found to have severe aortic regurgitation and moderate aortic valve stenosis, with a mean gradient across aortic valve 43mmHg with AVA, but judged to be severe aortic insufficiency in the setting of dilated aortic root with high flow from AI leading to elevated gradient across aortic valve.  He underwent coronary artery bypass graft with aortic valve replacement on 03/06/2019.  The patient had LIMA to LAD, left radial artery to PDA, predicted RIMA to the right posterior lateral artery, with open left radial artery harvest, bilateral IMA harvesting.  Also, 25 mm [name] Intuity Bovine bioprosthetic aortic valve placement.  Of note, the patient had a severe reaction to heparin with HIT and severe profound thrombocytopenia postoperatively. He had postoperative atrial fibrillation and therefore was started on amiodarone 200 mg daily.  He had a left arm hematoma and an elevated INR.  He is being followed by heme.  I reviewed these records for this visit.  He was started back on warfarin as his platelets increased and he now no longer needs to be seen by Heme per their report.   He has done well.  The hematoma on his arm is slowly resolving.  He is going to orientation a cardiac rehab soon.  He walks with a cane because of some knee problems.  He is  not having any new chest pressure, neck or arm discomfort.  Is not having any of the symptoms that he had with his myocardial infarction.  Is not having any shortness of breath, PND or orthopnea.  He has had no weight gain or edema.    Past Medical History:  Diagnosis Date  . Alcohol abuse   . Aortic valve disease    AI and AS, Moderate  . Chest pain 03/2019  . Coronary artery disease   . Drug use   . Edema of both lower extremities   . Glaucoma   . Hypertension   . OSA (obstructive sleep apnea)   . Shingles     Past Surgical History:  Procedure Laterality Date  . AORTIC VALVE REPLACEMENT N/A 03/06/2019   Procedure: AORTIC VALVE REPLACEMENT (AVR), USING INTUITY 25MM;  Surgeon: Wonda Olds, MD;  Location: [place];  Service: Open Heart Surgery;  Laterality: N/A;  . CATARACT EXTRACTION    . CORONARY ARTERY BYPASS GRAFT N/A 03/06/2019   Procedure: CORONARY ARTERY BYPASS GRAFTING (CABG), ON PUMP, TIMES THREE, USING BILATERAL INTERNAL MAMMARIES AND LEFT RADIAL ARTERY HARVEST;  Surgeon: Wonda Olds, MD;  Location: [place];  Service: Open Heart Surgery;  Laterality: N/A;  BILATERAL IMA  . LEFT HEART CATH AND CORONARY ANGIOGRAPHY N/A 03/04/2019   Procedure: LEFT HEART CATH AND CORONARY ANGIOGRAPHY;  Surgeon: Burnell Blanks, MD;  Location: [employer] [facility];  Service: Cardiovascular;  Laterality:  N/A;  . None    . RADIAL ARTERY HARVEST Left 03/06/2019   Procedure: RADIAL ARTERY HARVEST;  Surgeon: Wonda Olds, MD;  Location: [place];  Service: Open Heart Surgery;  Laterality: Left;  . TEE WITHOUT CARDIOVERSION N/A 03/06/2019   Procedure: TRANSESOPHAGEAL ECHOCARDIOGRAM (TEE);  Surgeon: Wonda Olds, MD;  Location: [place];  Service: Open Heart Surgery;  Laterality: N/A;     Current Outpatient Medications  Medication Sig Dispense Refill  . ALPRAZolam (XANAX) 0.5 MG tablet TAKE 1 TABLET BY MOUTH ONCE DAILY AS NEEDED FOR ANXIETY (TAKE  30  MINUTES  PRIOR  TO  PLANE  FLIGHT  OR   AS  NEEDED  FOR  ANXIETY) 20 tablet 0  . atorvastatin (LIPITOR) 80 MG tablet Take 1 tablet (80 mg total) by mouth daily. 30 tablet 3  . colchicine 0.6 MG tablet Take 2 tablets on first day of gout flare then 1 tablet daily until gout flare has resolved    . cyclobenzaprine (FLEXERIL) 10 MG tablet Take 1 tablet (10 mg total) by mouth at bedtime as needed for muscle spasms. 15 tablet 0  . ketoconazole (NIZORAL) 2 % shampoo Apply 1 application topically 2 (two) times a week. 120 mL 0  . latanoprost (XALATAN) 0.005 % ophthalmic solution Place 1 drop into both eyes every morning.     . metoprolol tartrate (LOPRESSOR) 25 MG tablet Take 0.5 tablets (12.5 mg total) by mouth 2 (two) times daily. 30 tablet 3  . Misc Natural Products (OSTEO BI-FLEX/5-LOXIN ADVANCED PO) Take 2 tablets by mouth daily.     . sertraline (ZOLOFT) 50 MG tablet Take 1 tablet (50 mg total) by mouth daily. 30 tablet 3  . traMADol (ULTRAM) 50 MG tablet Take 1-2 tablets (50-100 mg total) by mouth every 4 (four) hours as needed for moderate pain. 30 tablet 0  . traZODone (DESYREL) 50 MG tablet Take 0.5-1 tablets (25-50 mg total) by mouth at bedtime as needed for sleep. 30 tablet 3  . warfarin (COUMADIN) 2.5 MG tablet Take 1 tablet (2.5 mg total) by mouth daily. 30 tablet 3   No current facility-administered medications for this visit.    Allergies:   Heparin    ROS:  Please see the history of present illness.   Otherwise, review of systems are positive for none.   All other systems are reviewed and negative.    PHYSICAL EXAM: VS:  BP 130/70   Pulse 62   Temp (!) 96.3 F (35.7 C)   Ht 5\' 7"  (1.702 m)   Wt 210 lb 12.8 oz (95.6 kg)   SpO2 94%   BMI 33.02 kg/m  , BMI Body mass index is 33.02 kg/m.  GENERAL:  Well appearing NECK:  No jugular venous distention, waveform within normal limits, carotid upstroke brisk and symmetric, no bruits, no thyromegaly LUNGS:  Clear to auscultation bilaterally CHEST:  Well healed sternotomy  scar. HEART:  PMI not displaced or sustained,S1 and S2 within normal limits, no S3, no S4, no clicks, no rubs, 2 out of 6 brief apical systolic murmur radiating slightly at aortic outflow tract, no diastolic murmurs ABD:  Flat, positive bowel sounds normal in frequency in pitch, no bruits, no rebound, no guarding, no midline pulsatile mass, no hepatomegaly, no splenomegaly EXT:  2 plus pulses throughout, mild leg edema, no cyanosis no clubbing, fluctuant hematoma currently left radial harvest site   EKG:  EKG is not ordered today.    Recent Labs: 08/04/2018: ALT  16 08/14/2018: TSH 2.560 03/07/2019: Magnesium 2.3 04/15/2019: BUN 14; Creatinine, Ser 0.72; Potassium 3.8; Sodium 136 05/07/2019: Hemoglobin 10.4; Platelet Count 150    Lipid Panel    Component Value Date/Time   CHOL 161 08/04/2018 1052   TRIG 105 08/04/2018 1052   TRIG 162 (H) 10/25/2005 1507   HDL 66 08/04/2018 1052   CHOLHDL 2.4 08/04/2018 1052   CHOLHDL 3 01/14/2018 0752   VLDL 25.8 01/14/2018 0752   LDLCALC 74 08/04/2018 1052   LDLDIRECT 151.0 01/05/2016 0748      Wt Readings from Last 3 Encounters:  05/20/19 210 lb 12.8 oz (95.6 kg)  05/07/19 212 lb (96.2 kg)  05/04/19 209 lb (94.8 kg)      Other studies Reviewed: Additional studies/ records that were reviewed today include:   Surgical records, hematology records. Review of the above records demonstrates:  Please see elsewhere in the note.     ASSESSMENT AND PLAN:  CAD: S/P CABG. the patient is made a slow but steady recovery.  He is going up is getting cardiac rehab.  No change in therapy.  HIT:   I am going to continue the Coumadin and see him back in August at which point I might discontinue this.  PAF:  Has had no symptomatic tachyarrhythmias.  No change in therapy until I see him back at the end of the summer.  S/P Bioprosthetic AoV replacement:   I will check an echocardiogram in June for a baseline.  He understands endocarditis prophylaxis.    Hyperlipidemia:    The goal will be an LDL less than 70.  He can have a repeat lipid profile when he comes back for his echo.  Hematoma: This is slowly improving.    Covid education: He has had his vaccine.   Current medicines are reviewed at length with the patient today.  The patient does not have concerns regarding medicines.  The following changes have been made:  no change  Labs/ tests ordered today include:   Orders Placed This Encounter  Procedures  . ECHOCARDIOGRAM COMPLETE     Disposition:   FU with me in August.    Signed, [name], MD  05/20/2019 2:50 PM    [place]

## 2019-05-20 ENCOUNTER — Other Ambulatory Visit: Payer: Self-pay

## 2019-05-20 ENCOUNTER — Ambulatory Visit (INDEPENDENT_AMBULATORY_CARE_PROVIDER_SITE_OTHER): Payer: Medicare HMO | Admitting: Cardiology

## 2019-05-20 ENCOUNTER — Encounter: Payer: Self-pay | Admitting: Cardiology

## 2019-05-20 VITALS — BP 130/70 | HR 62 | Temp 96.3°F | Ht 67.0 in | Wt 210.8 lb

## 2019-05-20 DIAGNOSIS — I480 Paroxysmal atrial fibrillation: Secondary | ICD-10-CM | POA: Diagnosis not present

## 2019-05-20 DIAGNOSIS — Z951 Presence of aortocoronary bypass graft: Secondary | ICD-10-CM | POA: Diagnosis not present

## 2019-05-20 DIAGNOSIS — E785 Hyperlipidemia, unspecified: Secondary | ICD-10-CM

## 2019-05-20 DIAGNOSIS — Z7189 Other specified counseling: Secondary | ICD-10-CM

## 2019-05-20 DIAGNOSIS — T148XXA Other injury of unspecified body region, initial encounter: Secondary | ICD-10-CM

## 2019-05-20 DIAGNOSIS — Z952 Presence of prosthetic heart valve: Secondary | ICD-10-CM

## 2019-05-20 NOTE — Patient Instructions (Signed)
Medication Instructions:  NO CHANGES *If you need a refill on your cardiac medications before your next appointment, please call your pharmacy*  Lab Work: NONE ORDERED THIS VISIT  Testing/Procedures: Your physician has requested that you have an echocardiogram IN JUNE. Echocardiography is a painless test that uses sound waves to create images of your heart. It provides your doctor with information about the size and shape of your heart and how well your heart's chambers and valves are working. This procedure takes approximately one hour. There are no restrictions for this procedure. [place]  Follow-Up: At [facility], you and your health needs are our priority.  As part of our continuing mission to provide you with exceptional heart care, we have created designated Provider Care Teams.  These Care Teams include your primary Cardiologist (physician) and Advanced Practice Providers (APPs -  Physician Assistants and Nurse Practitioners) who all work together to provide you with the care you need, when you need it.  Your next appointment:   3 month(s)  The format for your next appointment:   In Person  Provider:   [name], MD

## 2019-05-25 ENCOUNTER — Encounter (HOSPITAL_COMMUNITY)
Admission: RE | Admit: 2019-05-25 | Discharge: 2019-05-25 | Disposition: A | Discharge status: Home / Self Care | Payer: Medicare HMO | Source: Ambulatory Visit | Attending: Cardiology | Admitting: Cardiology

## 2019-05-25 ENCOUNTER — Telehealth (HOSPITAL_COMMUNITY): Payer: Self-pay | Admitting: *Deleted

## 2019-05-25 DIAGNOSIS — Z952 Presence of prosthetic heart valve: Secondary | ICD-10-CM | POA: Insufficient documentation

## 2019-05-25 DIAGNOSIS — Z951 Presence of aortocoronary bypass graft: Secondary | ICD-10-CM | POA: Insufficient documentation

## 2019-05-25 NOTE — Telephone Encounter (Signed)
Spoke with patient. Completed health history. Confirmed appointment for orientation on 05/26/19.Barnet Pall, RN,BSN 05/25/2019 2:44 PM

## 2019-05-26 ENCOUNTER — Ambulatory Visit (HOSPITAL_COMMUNITY): Payer: Medicare HMO

## 2019-05-26 ENCOUNTER — Other Ambulatory Visit: Payer: Self-pay

## 2019-05-26 ENCOUNTER — Encounter (HOSPITAL_COMMUNITY)
Admission: RE | Admit: 2019-05-26 | Discharge: 2019-05-26 | Disposition: A | Discharge status: Home / Self Care | Payer: Medicare HMO | Source: Ambulatory Visit | Attending: Cardiology | Admitting: Cardiology

## 2019-05-26 VITALS — BP 128/72 | HR 61 | Ht 67.0 in | Wt 211.9 lb

## 2019-05-26 DIAGNOSIS — Z951 Presence of aortocoronary bypass graft: Secondary | ICD-10-CM

## 2019-05-26 DIAGNOSIS — Z952 Presence of prosthetic heart valve: Secondary | ICD-10-CM

## 2019-05-27 ENCOUNTER — Encounter (HOSPITAL_COMMUNITY): Payer: Self-pay

## 2019-05-27 NOTE — Progress Notes (Signed)
Cardiac Individual Treatment Plan  Patient Details  Name: Gregory [name] MRN: [id number] Date of Birth: [birth year] Referring Provider:     [facility] from 05/26/2019 in [place]  Referring Provider  [name], MD      Initial Encounter Date:    CARDIAC REHAB PHASE II ORIENTATION from 05/26/2019 in [place]  Date  05/26/19      Visit Diagnosis: S/P CABG x 2 03/06/19  S/P AVR (aortic valve replacement) 03/06/19  Patient's Home Medications on Admission:  Current Outpatient Medications:  .  ALPRAZolam (XANAX) 0.5 MG tablet, TAKE 1 TABLET BY MOUTH ONCE DAILY AS NEEDED FOR ANXIETY (TAKE  30  MINUTES  PRIOR  TO  PLANE  FLIGHT  OR  AS  NEEDED  FOR  ANXIETY), Disp: 20 tablet, Rfl: 0 .  atorvastatin (LIPITOR) 80 MG tablet, Take 1 tablet (80 mg total) by mouth daily., Disp: 30 tablet, Rfl: 3 .  colchicine 0.6 MG tablet, Take 2 tablets on first day of gout flare then 1 tablet daily until gout flare has resolved, Disp: , Rfl:  .  cyclobenzaprine (FLEXERIL) 10 MG tablet, Take 1 tablet (10 mg total) by mouth at bedtime as needed for muscle spasms., Disp: 15 tablet, Rfl: 0 .  ketoconazole (NIZORAL) 2 % shampoo, Apply 1 application topically 2 (two) times a week., Disp: 120 mL, Rfl: 0 .  latanoprost (XALATAN) 0.005 % ophthalmic solution, Place 1 drop into both eyes every morning. , Disp: , Rfl:  .  metoprolol tartrate (LOPRESSOR) 25 MG tablet, Take 0.5 tablets (12.5 mg total) by mouth 2 (two) times daily., Disp: 30 tablet, Rfl: 3 .  Misc Natural Products (OSTEO BI-FLEX/5-LOXIN ADVANCED PO), Take 2 tablets by mouth daily. , Disp: , Rfl:  .  sertraline (ZOLOFT) 50 MG tablet, Take 1 tablet (50 mg total) by mouth daily., Disp: 30 tablet, Rfl: 3 .  traMADol (ULTRAM) 50 MG tablet, Take 1-2 tablets (50-100 mg total) by mouth every 4 (four) hours as needed for moderate pain., Disp: 30 tablet, Rfl: 0 .  traZODone  (DESYREL) 50 MG tablet, Take 0.5-1 tablets (25-50 mg total) by mouth at bedtime as needed for sleep., Disp: 30 tablet, Rfl: 3 .  warfarin (COUMADIN) 2.5 MG tablet, Take 1 tablet (2.5 mg total) by mouth daily., Disp: 30 tablet, Rfl: 3  Past Medical History: Past Medical History:  Diagnosis Date  . Alcohol abuse   . Aortic valve disease    AI and AS, Moderate  . Chest pain 03/2019  . Coronary artery disease   . Drug use   . Edema of both lower extremities   . Glaucoma   . Hypertension   . OSA (obstructive sleep apnea)   . Shingles     Tobacco Use: Social History   Tobacco Use  Smoking Status Never Smoker  Smokeless Tobacco Never Used    Labs: Recent Review Flowsheet Data    Labs for ITP Cardiac and Pulmonary Rehab Latest Ref Rng & Units 03/06/2019 03/06/2019 03/06/2019 03/07/2019 03/08/2019   Cholestrol 100 - 199 mg/dL - - - - -   LDLCALC 0 - 99 mg/dL - - - - -   LDLDIRECT mg/dL - - - - -   HDL >39 mg/dL - - - - -   Trlycerides 0 - 149 mg/dL - - - - -   Hemoglobin A1c 4.8 - 5.6 % - - - - -  PHART 7.350 - 7.450 7.351 7.277(L) 7.328(L) 7.329(L) -   PCO2ART 32.0 - 48.0 mmHg 40.4 49.2(H) 44.8 42.8 -   HCO3 20.0 - 28.0 mmol/L 22.3 22.8 23.4 22.3 -   TCO2 22 - 32 mmol/L 24 24 25 24 30    ACIDBASEDEF 0.0 - 2.0 mmol/L 3.0(H) 4.0(H) 2.0 3.0(H) -   O2SAT % 95.0 89.0 96.0 91.0 -      Capillary Blood Glucose: Lab Results  Component Value Date   GLUCAP 89 03/20/2019   GLUCAP 104 (H) 03/19/2019   GLUCAP 116 (H) 03/19/2019   GLUCAP 120 (H) 03/19/2019   GLUCAP 97 03/19/2019     Exercise Target Goals: Exercise Program Goal: Individual exercise prescription set using results from initial 6 min walk test and THRR while considering  patient's activity barriers and safety.   Exercise Prescription Goal: Starting with aerobic activity 30 plus minutes a day, 3 days per week for initial exercise prescription. Provide home exercise prescription and guidelines that participant acknowledges  understanding prior to discharge.  Activity Barriers & Risk Stratification: Activity Barriers & Cardiac Risk Stratification - 05/26/19 1416      Activity Barriers & Cardiac Risk Stratification   Activity Barriers  Other (comment);Assistive Device    Comments  Bilateral knee pain.    Cardiac Risk Stratification  High       6 Minute Walk: 6 Minute Walk    Row Name 05/26/19 1426         6 Minute Walk   Phase  Initial     Distance  1006 feet     Walk Time  6 minutes     # of Rest Breaks  0     MPH  1.91     METS  2.15     RPE  11     Perceived Dyspnea   1     VO2 Peak  7.53     Symptoms  Yes (comment)     Comments  Patient c/o mild shortness of breath because of facial mask.     Resting HR  61 bpm     Resting BP  128/72     Resting Oxygen Saturation   99 %     Exercise Oxygen Saturation  during 6 min walk  94 %     Max Ex. HR  97 bpm     Max Ex. BP  148/82     2 Minute Post BP  138/70        Oxygen Initial Assessment:   Oxygen Re-Evaluation:   Oxygen Discharge (Final Oxygen Re-Evaluation):   Initial Exercise Prescription: Initial Exercise Prescription - 05/26/19 1500      Date of Initial Exercise RX and Referring Provider   Date  05/26/19    Referring Provider  [name], MD    Expected Discharge Date  07/22/19      Bike   Level  1.5    Minutes  15    METs  2      NuStep   Level  2    SPM  85    Minutes  15    METs  2      Prescription Details   Frequency (times per week)  3    Duration  Progress to 30 minutes of continuous aerobic without signs/symptoms of physical distress      Intensity   THRR 40-80% of Max Heartrate  60-120    Ratings of Perceived Exertion  11-13  Perceived Dyspnea  0-4      Progression   Progression  Continue to progress workloads to maintain intensity without signs/symptoms of physical distress.      Resistance Training   Training Prescription  Yes    Weight  3lbs    Reps  10-15       Perform Capillary  Blood Glucose checks as needed.  Exercise Prescription Changes:   Exercise Comments:   Exercise Goals and Review: Exercise Goals    Row Name 05/26/19 1418             Exercise Goals   Increase Physical Activity  Yes       Intervention  Provide advice, education, support and counseling about physical activity/exercise needs.;Develop an individualized exercise prescription for aerobic and resistive training based on initial evaluation findings, risk stratification, comorbidities and participant's personal goals.       Expected Outcomes  Short Term: Attend rehab on a regular basis to increase amount of physical activity.;Long Term: Add in home exercise to make exercise part of routine and to increase amount of physical activity.;Long Term: Exercising regularly at least 3-5 days a week.       Increase Strength and Stamina  Yes       Intervention  Provide advice, education, support and counseling about physical activity/exercise needs.;Develop an individualized exercise prescription for aerobic and resistive training based on initial evaluation findings, risk stratification, comorbidities and participant's personal goals.       Expected Outcomes  Short Term: Increase workloads from initial exercise prescription for resistance, speed, and METs.;Short Term: Perform resistance training exercises routinely during rehab and add in resistance training at home;Long Term: Improve cardiorespiratory fitness, muscular endurance and strength as measured by increased METs and functional capacity (6MWT)       Able to understand and use rate of perceived exertion (RPE) scale  Yes       Intervention  Provide education and explanation on how to use RPE scale       Expected Outcomes  Short Term: Able to use RPE daily in rehab to express subjective intensity level;Long Term:  Able to use RPE to guide intensity level when exercising independently       Knowledge and understanding of Target Heart Rate Range (THRR)   Yes       Intervention  Provide education and explanation of THRR including how the numbers were predicted and where they are located for reference       Expected Outcomes  Short Term: Able to state/look up THRR;Long Term: Able to use THRR to govern intensity when exercising independently;Short Term: Able to use daily as guideline for intensity in rehab       Able to check pulse independently  Yes       Intervention  Provide education and demonstration on how to check pulse in carotid and radial arteries.;Review the importance of being able to check your own pulse for safety during independent exercise       Expected Outcomes  Short Term: Able to explain why pulse checking is important during independent exercise;Long Term: Able to check pulse independently and accurately       Understanding of Exercise Prescription  Yes       Intervention  Provide education, explanation, and written materials on patient's individual exercise prescription       Expected Outcomes  Short Term: Able to explain program exercise prescription;Long Term: Able to explain home exercise prescription to exercise independently  Exercise Goals Re-Evaluation :    Discharge Exercise Prescription (Final Exercise Prescription Changes):   Nutrition:  Target Goals: Understanding of nutrition guidelines, daily intake of sodium 1500mg , cholesterol 200mg , calories 30% from fat and 7% or less from saturated fats, daily to have 5 or more servings of fruits and vegetables.  Biometrics: Pre Biometrics - 05/26/19 1423      Pre Biometrics   Height  5\' 7"  (1.702 m)    Weight  96.1 kg    Waist Circumference  43 inches    Hip Circumference  44.25 inches    Waist to Hip Ratio  0.97 %    BMI (Calculated)  33.17    Triceps Skinfold  16 mm    % Body Fat  31.5 %    Grip Strength  25.5 kg    Flexibility  14 in    Single Leg Stand  0.81 seconds        Nutrition Therapy Plan and Nutrition Goals:   Nutrition  Assessments:   Nutrition Goals Re-Evaluation:   Nutrition Goals Discharge (Final Nutrition Goals Re-Evaluation):   Psychosocial: Target Goals: Acknowledge presence or absence of significant depression and/or stress, maximize coping skills, provide positive support system. Participant is able to verbalize types and ability to use techniques and skills needed for reducing stress and depression.  Initial Review & Psychosocial Screening: Initial Psych Review & Screening - 05/26/19 1531      Initial Review   Current issues with  Current Stress Concerns    Source of Stress Concerns  Chronic Illness    Comments  Ned did experience some stress due to his recent hosptalization, HIT and hematoma. Festus Aloe is feeling better now.      Family Dynamics   Good Support System?  Yes   Festus Aloe has his wife for support     Barriers   Psychosocial barriers to participate in program  There are no identifiable barriers or psychosocial needs.      Screening Interventions   Interventions  Encouraged to exercise       Quality of Life Scores: Quality of Life - 05/26/19 1557      Quality of Life   Select  Quality of Life      Quality of Life Scores   Health/Function Pre  20.63 %    Socioeconomic Pre  22.29 %    Psych/Spiritual Pre  21.93 %    Family Pre  24 %    GLOBAL Pre  21.74 %      Scores of 19 and below usually indicate a poorer quality of life in these areas.  A difference of  2-3 points is a clinically meaningful difference.  A difference of 2-3 points in the total score of the Quality of Life Index has been associated with significant improvement in overall quality of life, self-image, physical symptoms, and general health in studies assessing change in quality of life.  PHQ-9: Recent Review Flowsheet Data    Depression screen [facility] 2/9 05/26/2019 08/14/2018 12/18/2016 01/11/2015   Decreased Interest 0 3 1 0   Down, Depressed, Hopeless 0 1 1 0   PHQ - 2 Score 0 4 2 0   Altered sleeping - 3 - -    Tired, decreased energy - 3 - -   Change in appetite - 1 - -   Trouble concentrating - 1 - -   Moving slowly or fidgety/restless - 3 - -   Suicidal thoughts - 0 - -  PHQ-9 Score - 15 - -   Difficult doing work/chores - Somewhat difficult - -     Interpretation of Total Score  Total Score Depression Severity:  1-4 = Minimal depression, 5-9 = Mild depression, 10-14 = Moderate depression, 15-19 = Moderately severe depression, 20-27 = Severe depression   Psychosocial Evaluation and Intervention:   Psychosocial Re-Evaluation:   Psychosocial Discharge (Final Psychosocial Re-Evaluation):   Vocational Rehabilitation: Provide vocational rehab assistance to qualifying candidates.   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 Participant will state understanding/return demonstration of topics presented.  Learning Barriers/Preferences: Learning Barriers/Preferences - 05/27/19 0826      Learning Barriers/Preferences   Learning Barriers  Sight   wears reading glasses   Learning Preferences  Pictoral       Education Topics: Hypertension, Hypertension Reduction -Define heart disease and high blood pressure. Discus how high blood pressure affects the body and ways to reduce high blood pressure.   Exercise and Your Heart -Discuss why it is important to exercise, the FITT principles of exercise, normal and abnormal responses to exercise, and how to exercise safely.   Angina -Discuss definition of angina, causes of angina, treatment of angina, and how to decrease risk of having angina.   Cardiac Medications -Review  what the following cardiac medications are used for, how they affect the body, and side effects that may occur when taking the medications.  Medications include Aspirin, Beta blockers, calcium channel blockers, ACE Inhibitors, angiotensin receptor blockers, diuretics, digoxin, and antihyperlipidemics.   Congestive Heart Failure -Discuss the definition of CHF, how to live with CHF, the signs and symptoms of CHF, and how keep track of weight and sodium intake.   Heart Disease and Intimacy -Discus the effect sexual activity has on the heart, how changes occur during intimacy as we age, and safety during sexual activity.   Smoking Cessation / COPD -Discuss different methods to quit smoking, the health benefits of quitting smoking, and the definition of COPD.   Nutrition I: Fats -Discuss the types of cholesterol, what cholesterol does to the heart, and how cholesterol levels can be controlled.   Nutrition II: Labels -Discuss the different components of food labels and how to read food label   Heart Parts/Heart Disease and PAD -Discuss the anatomy of the heart, the pathway of blood circulation through the heart, and these are affected by heart disease.   Stress I: Signs and Symptoms -Discuss the causes of stress, how stress may lead to anxiety and depression, and ways to limit stress.   Stress II: Relaxation -Discuss different types of relaxation techniques to limit stress.   Warning Signs of Stroke / TIA -Discuss definition of a stroke, what the signs and symptoms are of a stroke, and how to identify when someone is having stroke.   Knowledge Questionnaire Score: Knowledge Questionnaire Score - 05/26/19 1525      Knowledge Questionnaire Score   Pre Score  19/24       Core Components/Risk Factors/Patient Goals at Admission: Personal Goals and Risk Factors at Admission - 05/26/19 1558      Core Components/Risk Factors/Patient Goals on Admission  Weight Management   Yes;Obesity    Intervention  Weight Management/Obesity: Establish reasonable short term and long term weight goals.;Obesity: Provide education and appropriate resources to help participant work on and attain dietary goals.    Admit Weight  211 lb 13.8 oz (96.1 kg)    Expected Outcomes  Short Term: Continue to assess and modify interventions until short term weight is achieved;Long Term: Adherence to nutrition and physical activity/exercise program aimed toward attainment of established weight goal;Weight Maintenance: Understanding of the daily nutrition guidelines, which includes 25-35% calories from fat, 7% or less cal from saturated fats, less than 200mg  cholesterol, less than 1.5gm of sodium, & 5 or more servings of fruits and vegetables daily;Understanding recommendations for meals to include 15-35% energy as protein, 25-35% energy from fat, 35-60% energy from carbohydrates, less than 200mg  of dietary cholesterol, 20-35 gm of total fiber daily;Understanding of distribution of calorie intake throughout the day with the consumption of 4-5 meals/snacks    Hypertension  Yes    Intervention  Provide education on lifestyle modifcations including regular physical activity/exercise, weight management, moderate sodium restriction and increased consumption of fresh fruit, vegetables, and low fat dairy, alcohol moderation, and smoking cessation.;Monitor prescription use compliance.    Expected Outcomes  Short Term: Continued assessment and intervention until BP is < 140/21mm HG in hypertensive participants. < 130/64mm HG in hypertensive participants with diabetes, heart failure or chronic kidney disease.;Long Term: Maintenance of blood pressure at goal levels.    Lipids  Yes    Intervention  Provide education and support for participant on nutrition & aerobic/resistive exercise along with prescribed medications to achieve LDL 70mg , HDL >40mg .    Expected Outcomes  Short Term: Participant states understanding of  desired cholesterol values and is compliant with medications prescribed. Participant is following exercise prescription and nutrition guidelines.;Long Term: Cholesterol controlled with medications as prescribed, with individualized exercise RX and with personalized nutrition plan. Value goals: LDL < 70mg , HDL > 40 mg.       Core Components/Risk Factors/Patient Goals Review:    Core Components/Risk Factors/Patient Goals at Discharge (Final Review):    ITP Comments: ITP Comments    Row Name 05/26/19 1424           ITP Comments  Dr Fransico Him MD, Medical Director          Comments: Patient attended orientation on 05/27/2019 to review rules and guidelines for program.  Completed 6 minute walk test, Intitial ITP, and exercise prescription.  VSS. Telemetry-Sinus Rhythm, Bundle Branch this has been previously documented.  Ned reported feeling slightly mild of breath due to  Having to wearing a mask otherwise. Asymptomatic. Safety measures and social distancing in place per CDC guidelines.Barnet Pall, RN,BSN 05/27/2019 8:35 AM

## 2019-05-28 ENCOUNTER — Ambulatory Visit: Payer: Medicare HMO

## 2019-05-29 DIAGNOSIS — H401133 Primary open-angle glaucoma, bilateral, severe stage: Secondary | ICD-10-CM | POA: Diagnosis not present

## 2019-06-03 ENCOUNTER — Other Ambulatory Visit: Payer: Self-pay

## 2019-06-03 ENCOUNTER — Encounter (HOSPITAL_COMMUNITY)
Admission: RE | Admit: 2019-06-03 | Discharge: 2019-06-03 | Disposition: A | Discharge status: Home / Self Care | Payer: Medicare HMO | Source: Ambulatory Visit | Attending: Cardiology | Admitting: Cardiology

## 2019-06-03 VITALS — Ht 67.0 in | Wt 211.0 lb

## 2019-06-03 DIAGNOSIS — Z952 Presence of prosthetic heart valve: Secondary | ICD-10-CM | POA: Insufficient documentation

## 2019-06-03 DIAGNOSIS — Z951 Presence of aortocoronary bypass graft: Secondary | ICD-10-CM | POA: Insufficient documentation

## 2019-06-03 NOTE — Progress Notes (Signed)
Gregory [name] 69 y.o. male Nutrition Note  Visit Diagnosis: S/P CABG x 2 03/06/19  S/P AVR (aortic valve replacement) 03/06/19  Past Medical History:  Diagnosis Date  . Alcohol abuse   . Aortic valve disease    AI and AS, Moderate  . Chest pain 03/2019  . Coronary artery disease   . Drug use   . Edema of both lower extremities   . Glaucoma   . Hypertension   . OSA (obstructive sleep apnea)   . Shingles      Medications reviewed.   Current Outpatient Medications:  .  ALPRAZolam (XANAX) 0.5 MG tablet, TAKE 1 TABLET BY MOUTH ONCE DAILY AS NEEDED FOR ANXIETY (TAKE  30  MINUTES  PRIOR  TO  PLANE  FLIGHT  OR  AS  NEEDED  FOR  ANXIETY), Disp: 20 tablet, Rfl: 0 .  atorvastatin (LIPITOR) 80 MG tablet, Take 1 tablet (80 mg total) by mouth daily., Disp: 30 tablet, Rfl: 3 .  colchicine 0.6 MG tablet, Take 2 tablets on first day of gout flare then 1 tablet daily until gout flare has resolved, Disp: , Rfl:  .  cyclobenzaprine (FLEXERIL) 10 MG tablet, Take 1 tablet (10 mg total) by mouth at bedtime as needed for muscle spasms., Disp: 15 tablet, Rfl: 0 .  ketoconazole (NIZORAL) 2 % shampoo, Apply 1 application topically 2 (two) times a week., Disp: 120 mL, Rfl: 0 .  latanoprost (XALATAN) 0.005 % ophthalmic solution, Place 1 drop into both eyes every morning. , Disp: , Rfl:  .  metoprolol tartrate (LOPRESSOR) 25 MG tablet, Take 0.5 tablets (12.5 mg total) by mouth 2 (two) times daily., Disp: 30 tablet, Rfl: 3 .  Misc Natural Products (OSTEO BI-FLEX/5-LOXIN ADVANCED PO), Take 2 tablets by mouth daily. , Disp: , Rfl:  .  sertraline (ZOLOFT) 50 MG tablet, Take 1 tablet (50 mg total) by mouth daily., Disp: 30 tablet, Rfl: 3 .  traMADol (ULTRAM) 50 MG tablet, Take 1-2 tablets (50-100 mg total) by mouth every 4 (four) hours as needed for moderate pain., Disp: 30 tablet, Rfl: 0 .  traZODone (DESYREL) 50 MG tablet, Take 0.5-1 tablets (25-50 mg total) by mouth at bedtime as needed for sleep., Disp: 30  tablet, Rfl: 3 .  warfarin (COUMADIN) 2.5 MG tablet, Take 1 tablet (2.5 mg total) by mouth daily., Disp: 30 tablet, Rfl: 3   Ht Readings from Last 1 Encounters:  05/26/19 5\' 7"  (1.702 m)     Wt Readings from Last 3 Encounters:  05/26/19 211 lb 13.8 oz (96.1 kg)  05/20/19 210 lb 12.8 oz (95.6 kg)  05/07/19 212 lb (96.2 kg)     There is no height or weight on file to calculate BMI.   Social History   Tobacco Use  Smoking Status Never Smoker  Smokeless Tobacco Never Used     Lab Results  Component Value Date   CHOL 161 08/04/2018   Lab Results  Component Value Date   HDL 66 08/04/2018   Lab Results  Component Value Date   LDLCALC 74 08/04/2018   Lab Results  Component Value Date   TRIG 105 08/04/2018     Lab Results  Component Value Date   HGBA1C 5.6 03/05/2019     CBG (last 3)  No results for input(s): GLUCAP in the last 72 hours.   Nutrition Note  Spoke with pt and his wife. Nutrition Plan and Nutrition Survey goals reviewed with pt. Pt is following a Heart  Healthy diet. Pt wants to lose wt. Pt lost 40 lbs with [facility]. He then lost another 30 lbs after surgery due to poor appetite and abstaining from alcohol. He says he would like to lose another 20 lbs.  We reviewed metabolic adaptations to weight loss. His wife reports pt losing quite a bit of muscle mass.  Pt has Pre-diabetes. Last A1c indicates blood glucose well-controlled.   Per discussion, pt does not use canned/convenience foods often. Pt does not add salt to food. Pt does not eat out frequently.  He feels satisfied with his diet. Pt expressed understanding of the information reviewed.   Nutrition Diagnosis ? Obese  I = 30-34.9 related to excessive energy intake as evidenced by a 33.05 kg/m2  Nutrition Intervention ? Pt's individual nutrition plan reviewed with pt. ? Benefits of adopting Heart Healthy diet discussed when Medficts reviewed.   ? Continue client-centered  nutrition education by RD, as part of interdisciplinary care.  Goal(s) ? Pt to identify food quantities necessary to achieve weight loss of 6-24 lb at graduation from cardiac rehab.  ? Pt to build a healthy plate including vegetables, fruits, whole grains, and low-fat dairy products in a heart healthy meal plan. ?   Plan:   Will provide client-centered nutrition education as part of interdisciplinary care  Monitor and evaluate progress toward nutrition goal with team.   Michaele Offer, MS, RDN, LDN

## 2019-06-03 NOTE — Progress Notes (Signed)
Cardiac Individual Treatment Plan  Patient Details  Name: Gregory [name] MRN: [id number] Date of Birth: [birth year] Referring Provider:     [place] from 05/26/2019 in [facility]  Referring Provider  [name], MD      Initial Encounter Date:    CARDIAC REHAB PHASE II ORIENTATION from 05/26/2019 in [facility]  Date  05/26/19      Visit Diagnosis: S/P CABG x 2 03/06/19  S/P AVR (aortic valve replacement) 03/06/19  Patient's Home Medications on Admission:  Current Outpatient Medications:  .  ALPRAZolam (XANAX) 0.5 MG tablet, TAKE 1 TABLET BY MOUTH ONCE DAILY AS NEEDED FOR ANXIETY (TAKE  30  MINUTES  PRIOR  TO  PLANE  FLIGHT  OR  AS  NEEDED  FOR  ANXIETY), Disp: 20 tablet, Rfl: 0 .  atorvastatin (LIPITOR) 80 MG tablet, Take 1 tablet (80 mg total) by mouth daily., Disp: 30 tablet, Rfl: 3 .  colchicine 0.6 MG tablet, Take 2 tablets on first day of gout flare then 1 tablet daily until gout flare has resolved, Disp: , Rfl:  .  cyclobenzaprine (FLEXERIL) 10 MG tablet, Take 1 tablet (10 mg total) by mouth at bedtime as needed for muscle spasms., Disp: 15 tablet, Rfl: 0 .  ketoconazole (NIZORAL) 2 % shampoo, Apply 1 application topically 2 (two) times a week., Disp: 120 mL, Rfl: 0 .  latanoprost (XALATAN) 0.005 % ophthalmic solution, Place 1 drop into both eyes every morning. , Disp: , Rfl:  .  metoprolol tartrate (LOPRESSOR) 25 MG tablet, Take 0.5 tablets (12.5 mg total) by mouth 2 (two) times daily., Disp: 30 tablet, Rfl: 3 .  Misc Natural Products (OSTEO BI-FLEX/5-LOXIN ADVANCED PO), Take 2 tablets by mouth daily. , Disp: , Rfl:  .  sertraline (ZOLOFT) 50 MG tablet, Take 1 tablet (50 mg total) by mouth daily., Disp: 30 tablet, Rfl: 3 .  traMADol (ULTRAM) 50 MG tablet, Take 1-2 tablets (50-100 mg total) by mouth every 4 (four) hours as needed for moderate pain., Disp: 30 tablet, Rfl: 0 .  traZODone  (DESYREL) 50 MG tablet, Take 0.5-1 tablets (25-50 mg total) by mouth at bedtime as needed for sleep., Disp: 30 tablet, Rfl: 3 .  warfarin (COUMADIN) 2.5 MG tablet, Take 1 tablet (2.5 mg total) by mouth daily., Disp: 30 tablet, Rfl: 3  Past Medical History: Past Medical History:  Diagnosis Date  . Alcohol abuse   . Aortic valve disease    AI and AS, Moderate  . Chest pain 03/2019  . Coronary artery disease   . Drug use   . Edema of both lower extremities   . Glaucoma   . Hypertension   . OSA (obstructive sleep apnea)   . Shingles     Tobacco Use: Social History   Tobacco Use  Smoking Status Never Smoker  Smokeless Tobacco Never Used    Labs: Recent Review Flowsheet Data    Labs for ITP Cardiac and Pulmonary Rehab Latest Ref Rng & Units 03/06/2019 03/06/2019 03/06/2019 03/07/2019 03/08/2019   Cholestrol 100 - 199 mg/dL - - - - -   LDLCALC 0 - 99 mg/dL - - - - -   LDLDIRECT mg/dL - - - - -   HDL >39 mg/dL - - - - -   Trlycerides 0 - 149 mg/dL - - - - -   Hemoglobin A1c 4.8 - 5.6 % - - - - -  PHART 7.350 - 7.450 7.351 7.277(L) 7.328(L) 7.329(L) -   PCO2ART 32.0 - 48.0 mmHg 40.4 49.2(H) 44.8 42.8 -   HCO3 20.0 - 28.0 mmol/L 22.3 22.8 23.4 22.3 -   TCO2 22 - 32 mmol/L 24 24 25 24 30    ACIDBASEDEF 0.0 - 2.0 mmol/L 3.0(H) 4.0(H) 2.0 3.0(H) -   O2SAT % 95.0 89.0 96.0 91.0 -      Capillary Blood Glucose: Lab Results  Component Value Date   GLUCAP 89 03/20/2019   GLUCAP 104 (H) 03/19/2019   GLUCAP 116 (H) 03/19/2019   GLUCAP 120 (H) 03/19/2019   GLUCAP 97 03/19/2019     Exercise Target Goals: Exercise Program Goal: Individual exercise prescription set using results from initial 6 min walk test and THRR while considering  patient's activity barriers and safety.   Exercise Prescription Goal: Starting with aerobic activity 30 plus minutes a day, 3 days per week for initial exercise prescription. Provide home exercise prescription and guidelines that participant acknowledges  understanding prior to discharge.  Activity Barriers & Risk Stratification: Activity Barriers & Cardiac Risk Stratification - 05/26/19 1416      Activity Barriers & Cardiac Risk Stratification   Activity Barriers  Other (comment);Assistive Device    Comments  Bilateral knee pain.    Cardiac Risk Stratification  High       6 Minute Walk: 6 Minute Walk    Row Name 05/26/19 1426         6 Minute Walk   Phase  Initial     Distance  1006 feet     Walk Time  6 minutes     # of Rest Breaks  0     MPH  1.91     METS  2.15     RPE  11     Perceived Dyspnea   1     VO2 Peak  7.53     Symptoms  Yes (comment)     Comments  Patient c/o mild shortness of breath because of facial mask.     Resting HR  61 bpm     Resting BP  128/72     Resting Oxygen Saturation   99 %     Exercise Oxygen Saturation  during 6 min walk  94 %     Max Ex. HR  97 bpm     Max Ex. BP  148/82     2 Minute Post BP  138/70        Oxygen Initial Assessment:   Oxygen Re-Evaluation:   Oxygen Discharge (Final Oxygen Re-Evaluation):   Initial Exercise Prescription: Initial Exercise Prescription - 05/26/19 1500      Date of Initial Exercise RX and Referring Provider   Date  05/26/19    Referring Provider  [name], MD    Expected Discharge Date  07/22/19      Bike   Level  1.5    Minutes  15    METs  2      NuStep   Level  2    SPM  85    Minutes  15    METs  2      Prescription Details   Frequency (times per week)  3    Duration  Progress to 30 minutes of continuous aerobic without signs/symptoms of physical distress      Intensity   THRR 40-80% of Max Heartrate  60-120    Ratings of Perceived Exertion  11-13  Perceived Dyspnea  0-4      Progression   Progression  Continue to progress workloads to maintain intensity without signs/symptoms of physical distress.      Resistance Training   Training Prescription  Yes    Weight  3lbs    Reps  10-15       Perform Capillary  Blood Glucose checks as needed.  Exercise Prescription Changes:  Exercise Prescription Changes    Row Name 06/03/19 1300             Response to Exercise   Blood Pressure (Admit)  140/72       Blood Pressure (Exercise)  118/72       Blood Pressure (Exit)  114/70       Heart Rate (Admit)  70 bpm       Heart Rate (Exercise)  78 bpm       Heart Rate (Exit)  66 bpm       Rating of Perceived Exertion (Exercise)  11       Perceived Dyspnea (Exercise)  0       Symptoms  None       Comments  Changed Prescription to 30 minutes on Nustep       Duration  Progress to 10 minutes continuous walking  at current work load and total walking time to 30-45 min       Intensity  THRR unchanged         Progression   Progression  Continue to progress workloads to maintain intensity without signs/symptoms of physical distress.       Average METs  1.8         Resistance Training   Training Prescription  No         NuStep   Level  2       SPM  85       Minutes  30       METs  1.8          Exercise Comments:  Exercise Comments    Row Name 06/03/19 1340           Exercise Comments  Pt's first day of exercise. Pt responded well to exercise prescription. Upright recumbent bike was not comfortable for pt. Changed prescription to Nustep for 25-30 minutes. Will continue to monitor and progress workloads.          Exercise Goals and Review:  Exercise Goals    Row Name 05/26/19 1418             Exercise Goals   Increase Physical Activity  Yes       Intervention  Provide advice, education, support and counseling about physical activity/exercise needs.;Develop an individualized exercise prescription for aerobic and resistive training based on initial evaluation findings, risk stratification, comorbidities and participant's personal goals.       Expected Outcomes  Short Term: Attend rehab on a regular basis to increase amount of physical activity.;Long Term: Add in home exercise to make  exercise part of routine and to increase amount of physical activity.;Long Term: Exercising regularly at least 3-5 days a week.       Increase Strength and Stamina  Yes       Intervention  Provide advice, education, support and counseling about physical activity/exercise needs.;Develop an individualized exercise prescription for aerobic and resistive training based on initial evaluation findings, risk stratification, comorbidities and participant's personal goals.       Expected Outcomes  Short Term: Increase  workloads from initial exercise prescription for resistance, speed, and METs.;Short Term: Perform resistance training exercises routinely during rehab and add in resistance training at home;Long Term: Improve cardiorespiratory fitness, muscular endurance and strength as measured by increased METs and functional capacity (6MWT)       Able to understand and use rate of perceived exertion (RPE) scale  Yes       Intervention  Provide education and explanation on how to use RPE scale       Expected Outcomes  Short Term: Able to use RPE daily in rehab to express subjective intensity level;Long Term:  Able to use RPE to guide intensity level when exercising independently       Knowledge and understanding of Target Heart Rate Range (THRR)  Yes       Intervention  Provide education and explanation of THRR including how the numbers were predicted and where they are located for reference       Expected Outcomes  Short Term: Able to state/look up THRR;Long Term: Able to use THRR to govern intensity when exercising independently;Short Term: Able to use daily as guideline for intensity in rehab       Able to check pulse independently  Yes       Intervention  Provide education and demonstration on how to check pulse in carotid and radial arteries.;Review the importance of being able to check your own pulse for safety during independent exercise       Expected Outcomes  Short Term: Able to explain why pulse  checking is important during independent exercise;Long Term: Able to check pulse independently and accurately       Understanding of Exercise Prescription  Yes       Intervention  Provide education, explanation, and written materials on patient's individual exercise prescription       Expected Outcomes  Short Term: Able to explain program exercise prescription;Long Term: Able to explain home exercise prescription to exercise independently          Exercise Goals Re-Evaluation :    Discharge Exercise Prescription (Final Exercise Prescription Changes): Exercise Prescription Changes - 06/03/19 1300      Response to Exercise   Blood Pressure (Admit)  140/72    Blood Pressure (Exercise)  118/72    Blood Pressure (Exit)  114/70    Heart Rate (Admit)  70 bpm    Heart Rate (Exercise)  78 bpm    Heart Rate (Exit)  66 bpm    Rating of Perceived Exertion (Exercise)  11    Perceived Dyspnea (Exercise)  0    Symptoms  None    Comments  Changed Prescription to 30 minutes on Nustep    Duration  Progress to 10 minutes continuous walking  at current work load and total walking time to 30-45 min    Intensity  THRR unchanged      Progression   Progression  Continue to progress workloads to maintain intensity without signs/symptoms of physical distress.    Average METs  1.8      Resistance Training   Training Prescription  No      NuStep   Level  2    SPM  85    Minutes  30    METs  1.8       Nutrition:  Target Goals: Understanding of nutrition guidelines, daily intake of sodium 1500mg , cholesterol 200mg , calories 30% from fat and 7% or less from saturated fats, daily to have 5 or more servings of  fruits and vegetables.  Biometrics: Pre Biometrics - 05/26/19 1423      Pre Biometrics   Height  5\' 7"  (1.702 m)    Weight  96.1 kg    Waist Circumference  43 inches    Hip Circumference  44.25 inches    Waist to Hip Ratio  0.97 %    BMI (Calculated)  33.17    Triceps Skinfold  16 mm     % Body Fat  31.5 %    Grip Strength  25.5 kg    Flexibility  14 in    Single Leg Stand  0.81 seconds        Nutrition Therapy Plan and Nutrition Goals:   Nutrition Assessments:   Nutrition Goals Re-Evaluation:   Nutrition Goals Discharge (Final Nutrition Goals Re-Evaluation):   Psychosocial: Target Goals: Acknowledge presence or absence of significant depression and/or stress, maximize coping skills, provide positive support system. Participant is able to verbalize types and ability to use techniques and skills needed for reducing stress and depression.  Initial Review & Psychosocial Screening: Initial Psych Review & Screening - 05/26/19 1531      Initial Review   Current issues with  Current Stress Concerns    Source of Stress Concerns  Chronic Illness    Comments  Ned did experience some stress due to his recent hosptalization, HIT and hematoma. Festus Aloe is feeling better now.      Family Dynamics   Good Support System?  Yes   Festus Aloe has his wife for support     Barriers   Psychosocial barriers to participate in program  There are no identifiable barriers or psychosocial needs.      Screening Interventions   Interventions  Encouraged to exercise       Quality of Life Scores: Quality of Life - 05/26/19 1557      Quality of Life   Select  Quality of Life      Quality of Life Scores   Health/Function Pre  20.63 %    Socioeconomic Pre  22.29 %    Psych/Spiritual Pre  21.93 %    Family Pre  24 %    GLOBAL Pre  21.74 %      Scores of 19 and below usually indicate a poorer quality of life in these areas.  A difference of  2-3 points is a clinically meaningful difference.  A difference of 2-3 points in the total score of the Quality of Life Index has been associated with significant improvement in overall quality of life, self-image, physical symptoms, and general health in studies assessing change in quality of life.  PHQ-9: Recent Review Flowsheet Data     Depression screen [facility] 2/9 05/26/2019 08/14/2018 12/18/2016 01/11/2015   Decreased Interest 0 3 1 0   Down, Depressed, Hopeless 0 1 1 0   PHQ - 2 Score 0 4 2 0   Altered sleeping - 3 - -   Tired, decreased energy - 3 - -   Change in appetite - 1 - -   Trouble concentrating - 1 - -   Moving slowly or fidgety/restless - 3 - -   Suicidal thoughts - 0 - -   PHQ-9 Score - 15 - -   Difficult doing work/chores - Somewhat difficult - -     Interpretation of Total Score  Total Score Depression Severity:  1-4 = Minimal depression, 5-9 = Mild depression, 10-14 = Moderate depression, 15-19 = Moderately severe depression, 20-27 = Severe  depression   Psychosocial Evaluation and Intervention:   Psychosocial Re-Evaluation:   Psychosocial Discharge (Final Psychosocial Re-Evaluation):   Vocational Rehabilitation: Provide vocational rehab assistance to qualifying candidates.   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 Participant will state understanding/return demonstration of topics presented.  Learning Barriers/Preferences: Learning Barriers/Preferences - 05/27/19 0826      Learning Barriers/Preferences   Learning Barriers  Sight   wears reading glasses   Learning Preferences  Pictoral       Education Topics: Hypertension, Hypertension Reduction -Define heart disease and high blood pressure. Discus how high blood pressure affects the body and ways to reduce high blood pressure.   Exercise and Your Heart -Discuss why it is important to exercise, the FITT principles of exercise, normal and abnormal responses to exercise,  and how to exercise safely.   Angina -Discuss definition of angina, causes of angina, treatment of angina, and how to decrease risk of having angina.   Cardiac Medications -Review what the following cardiac medications are used for, how they affect the body, and side effects that may occur when taking the medications.  Medications include Aspirin, Beta blockers, calcium channel blockers, ACE Inhibitors, angiotensin receptor blockers, diuretics, digoxin, and antihyperlipidemics.   Congestive Heart Failure -Discuss the definition of CHF, how to live with CHF, the signs and symptoms of CHF, and how keep track of weight and sodium intake.   Heart Disease and Intimacy -Discus the effect sexual activity has on the heart, how changes occur during intimacy as we age, and safety during sexual activity.   Smoking Cessation / COPD -Discuss different methods to quit smoking, the health benefits of quitting smoking, and the definition of COPD.   Nutrition I: Fats -Discuss the types of cholesterol, what cholesterol does to the heart, and how cholesterol levels can be controlled.   Nutrition II: Labels -Discuss the different components of food labels and how to read food label   Heart Parts/Heart Disease and PAD -Discuss the anatomy of the heart, the pathway of blood circulation through the heart, and these are affected by heart disease.   Stress I: Signs and Symptoms -Discuss the causes of stress, how stress may lead to anxiety and depression, and ways to limit stress.   Stress II: Relaxation -Discuss different types of relaxation techniques to limit stress.   Warning Signs of Stroke / TIA -Discuss definition of a stroke, what the signs and symptoms are of a stroke, and how to identify when someone is having stroke.   Knowledge Questionnaire Score: Knowledge Questionnaire Score - 05/26/19 1525      Knowledge Questionnaire Score   Pre Score  19/24       Core Components/Risk  Factors/Patient Goals at Admission: Personal Goals and Risk Factors at Admission - 05/26/19 1558      Core Components/Risk Factors/Patient Goals on Admission    Weight Management  Yes;Obesity    Intervention  Weight Management/Obesity: Establish reasonable short term and long term weight goals.;Obesity: Provide education and appropriate resources to help participant work on and attain dietary goals.    Admit Weight  211 lb 13.8 oz (96.1 kg)  Expected Outcomes  Short Term: Continue to assess and modify interventions until short term weight is achieved;Long Term: Adherence to nutrition and physical activity/exercise program aimed toward attainment of established weight goal;Weight Maintenance: Understanding of the daily nutrition guidelines, which includes 25-35% calories from fat, 7% or less cal from saturated fats, less than 200mg  cholesterol, less than 1.5gm of sodium, & 5 or more servings of fruits and vegetables daily;Understanding recommendations for meals to include 15-35% energy as protein, 25-35% energy from fat, 35-60% energy from carbohydrates, less than 200mg  of dietary cholesterol, 20-35 gm of total fiber daily;Understanding of distribution of calorie intake throughout the day with the consumption of 4-5 meals/snacks    Hypertension  Yes    Intervention  Provide education on lifestyle modifcations including regular physical activity/exercise, weight management, moderate sodium restriction and increased consumption of fresh fruit, vegetables, and low fat dairy, alcohol moderation, and smoking cessation.;Monitor prescription use compliance.    Expected Outcomes  Short Term: Continued assessment and intervention until BP is < 140/74mm HG in hypertensive participants. < 130/4mm HG in hypertensive participants with diabetes, heart failure or chronic kidney disease.;Long Term: Maintenance of blood pressure at goal levels.    Lipids  Yes    Intervention  Provide education and support for  participant on nutrition & aerobic/resistive exercise along with prescribed medications to achieve LDL 70mg , HDL >40mg .    Expected Outcomes  Short Term: Participant states understanding of desired cholesterol values and is compliant with medications prescribed. Participant is following exercise prescription and nutrition guidelines.;Long Term: Cholesterol controlled with medications as prescribed, with individualized exercise RX and with personalized nutrition plan. Value goals: LDL < 70mg , HDL > 40 mg.       Core Components/Risk Factors/Patient Goals Review:  Goals and Risk Factor Review    Row Name 06/03/19 1215             Core Components/Risk Factors/Patient Goals Review   Personal Goals Review  Weight Management/Obesity;Lipids;Hypertension       Review  Ned started exercise on 06/03/19 without difficulty       Expected Outcomes  Ned will continue to participate in for exercise, nutrition and lifestyle modifications          Core Components/Risk Factors/Patient Goals at Discharge (Final Review):  Goals and Risk Factor Review - 06/03/19 1215      Core Components/Risk Factors/Patient Goals Review   Personal Goals Review  Weight Management/Obesity;Lipids;Hypertension    Review  Ned started exercise on 06/03/19 without difficulty    Expected Outcomes  Ned will continue to participate in for exercise, nutrition and lifestyle modifications       ITP Comments: ITP Comments    Row Name 05/26/19 1424 06/03/19 1214         ITP Comments  Dr Fransico Him MD, Medical Director  30 Day ITP Review. Ned started exercise at cardiac rehab on 06/03/19 and exercised without diffculty.         Comments: Festus Aloe  started cardiac rehab today.  Pt tolerated light exercise without difficulty.Ned uses a cane for stability.Ned exercised on the nustep for 30 minutes. Ned tried the [employer] fit bike but was unable to use due to chronic knee problems, VSS, telemetry-Sinus Rhythm Bundle Branch Block, asymptomatic.   Medication list reconciled. Pt denies barriers to medicaiton compliance.  PSYCHOSOCIAL ASSESSMENT:  PHQ-0. Pt exhibits positive coping skills, hopeful outlook with supportive family. No psychosocial needs identified at this time, no psychosocial interventions necessary.    Pt enjoys listening  to music and working in his work shop.   Pt oriented to exercise equipment and routine.    Understanding verbalized.QUALITY OF LIFE SCORE REVIEW  Pt completed Quality of Life survey as a participant in Cardiac Rehab. Scores 21.0 or below are considered low. Pt scored low in health and functioning Overall 21.74, Health and Function 20.63, socioeconomic 22.29, physiological and spiritual 21.93, family 24.0. Patient quality of life slightly altered by physical constraints which limits ability to perform as prior to recent cardiac illness. Liana Gerold emotional support and reassurance.  Will continue to monitor and intervene as necessary.  Barnet Pall, RN,BSN 06/03/2019 2:18 PM

## 2019-06-04 ENCOUNTER — Ambulatory Visit (INDEPENDENT_AMBULATORY_CARE_PROVIDER_SITE_OTHER): Payer: Medicare HMO

## 2019-06-04 DIAGNOSIS — Z7901 Long term (current) use of anticoagulants: Secondary | ICD-10-CM | POA: Diagnosis not present

## 2019-06-04 LAB — POCT INR: INR: 1.2 — AB (ref 2.0–3.0)

## 2019-06-04 NOTE — Patient Instructions (Addendum)
Pre visit review using our clinic review tool, if applicable. No additional management support is needed unless otherwise documented below in the visit note.  Increase dose today and tomorrow to 2.5mg  then continue taking 1.25 daily except take 2.5mg  on Tues and Sat. Recheck in 2 wks.

## 2019-06-08 ENCOUNTER — Other Ambulatory Visit: Payer: Self-pay

## 2019-06-08 ENCOUNTER — Encounter (HOSPITAL_COMMUNITY)
Admission: RE | Admit: 2019-06-08 | Discharge: 2019-06-08 | Disposition: A | Discharge status: Home / Self Care | Payer: Medicare HMO | Source: Ambulatory Visit | Attending: Cardiology | Admitting: Cardiology

## 2019-06-08 DIAGNOSIS — Z952 Presence of prosthetic heart valve: Secondary | ICD-10-CM

## 2019-06-08 DIAGNOSIS — Z951 Presence of aortocoronary bypass graft: Secondary | ICD-10-CM

## 2019-06-10 ENCOUNTER — Encounter (HOSPITAL_COMMUNITY)
Admission: RE | Admit: 2019-06-10 | Discharge: 2019-06-10 | Disposition: A | Discharge status: Home / Self Care | Payer: Medicare HMO | Source: Ambulatory Visit | Attending: Cardiology | Admitting: Cardiology

## 2019-06-10 ENCOUNTER — Other Ambulatory Visit: Payer: Self-pay

## 2019-06-10 ENCOUNTER — Telehealth: Payer: Self-pay | Admitting: Cardiology

## 2019-06-10 ENCOUNTER — Ambulatory Visit (INDEPENDENT_AMBULATORY_CARE_PROVIDER_SITE_OTHER): Payer: Medicare HMO | Admitting: Primary Care

## 2019-06-10 ENCOUNTER — Encounter: Payer: Self-pay | Admitting: Primary Care

## 2019-06-10 VITALS — BP 130/84 | HR 89 | Temp 96.8°F | Ht 67.0 in | Wt 211.5 lb

## 2019-06-10 DIAGNOSIS — R350 Frequency of micturition: Secondary | ICD-10-CM | POA: Diagnosis not present

## 2019-06-10 DIAGNOSIS — N5089 Other specified disorders of the male genital organs: Secondary | ICD-10-CM | POA: Insufficient documentation

## 2019-06-10 DIAGNOSIS — Z952 Presence of prosthetic heart valve: Secondary | ICD-10-CM

## 2019-06-10 DIAGNOSIS — Z951 Presence of aortocoronary bypass graft: Secondary | ICD-10-CM

## 2019-06-10 LAB — POC URINALSYSI DIPSTICK (AUTOMATED)
Glucose, UA: NEGATIVE
Nitrite, UA: POSITIVE
Protein, UA: POSITIVE — AB
Spec Grav, UA: 1.015 (ref 1.010–1.025)
Urobilinogen, UA: 0.2 E.U./dL
pH, UA: 6 (ref 5.0–8.0)

## 2019-06-10 MED ORDER — CEPHALEXIN 500 MG PO CAPS: 500.0000 mg | ORAL_CAPSULE | Freq: Three times a day (TID) | ORAL | 0 refills | Status: AC

## 2019-06-10 NOTE — Telephone Encounter (Signed)
   Primary Cardiologist: [name], MD  Simple dental extractions are considered low risk procedures per guidelines and generally do not require any specific cardiac clearance. It is also generally accepted that for simple extractions and dental cleanings, there is no need to interrupt blood thinner therapy.  [name]. It does not look like patient has a penicillin allergy; therefore, recommended PO Amoxicillin 2g one hour prior to dental cleaning. If you need [place] to prescribe this, please let us know.  I will route this recommendation to the requesting party via Epic fax function and remove from pre-op pool.  Please call with questions.  Darreld Mclean, PA-C 06/10/2019, 10:25 AM

## 2019-06-10 NOTE — Progress Notes (Signed)
Subjective:    Patient ID: Gregory [name], male    DOB: [birth year], 69 y.o.   MRN: [id number]  HPI  This visit occurred during the SARS-CoV-2 public health emergency.  Safety protocols were in place, including screening questions prior to the visit, additional usage of staff PPE, and extensive cleaning of exam room while observing appropriate contact time as indicated for disinfecting solutions.   Gregory [name] is a 69 year old male patient of Dr. Diona Browner with a medical history of CAD with CABG x 3, osteoarthritis, coagulation disorder, lymphedema, prediabetes who presents today with a chief complaint of scrotal swelling.  Two evenings ago he was getting ready for bed, didn't have underwear on, was turning and accidentally "squashed" his testicle, but didn't really have much pain after. Yesterday he noticed a slight increase in pain and swelling. This morning he noticed a significant increase in swelling and pain, also with some redness. Sitting, getting in and out of his vehicle causes more pain.   He's also noticed nocturia, getting up 5 times a night on average for the last 2 weeks. Also with cloudy and darker urine over the last two weeks.   He denies hematuria, rectal bleeding, jaundice to skin, fevers, difficulty urinating. He is managed on Coumadin for chronic atrial fibrillation.  BP Readings from Last 3 Encounters:  06/10/19 130/84  05/26/19 128/72  05/20/19 130/70     Review of Systems  Constitutional: Negative for fever.  Respiratory: Negative for shortness of breath.   Gastrointestinal: Negative for abdominal pain.  Genitourinary: Positive for frequency, scrotal swelling and urgency. Negative for difficulty urinating, dysuria, flank pain, hematuria and testicular pain.       Past Medical History:  Diagnosis Date  . Alcohol abuse   . Aortic valve disease    AI and AS, Moderate  . Chest pain 03/2019  . Coronary artery disease   . Drug use   . Edema of both lower  extremities   . Glaucoma   . Hypertension   . OSA (obstructive sleep apnea)   . Shingles      Social History   Socioeconomic History  . Marital status: Married    Spouse name: Cindi  . Number of children: 3  . Years of education: Not on file  . Highest education level: Not on file  Occupational History  . Occupation: Architect  Tobacco Use  . Smoking status: Never Smoker  . Smokeless tobacco: Never Used  Substance and Sexual Activity  . Alcohol use: Yes    Alcohol/week: 5.0 standard drinks    Types: 5 Cans of beer per week  . Drug use: Yes    Types: Marijuana  . Sexual activity: Not on file  Other Topics Concern  . Not on file  Social History Narrative   Lives at home with wife.   Social Determinants of Health   Financial Resource Strain:   . Difficulty of Paying Living Expenses:   Food Insecurity:   . Worried About Charity fundraiser in the Last Year:   . Arboriculturist in the Last Year:   Transportation Needs:   . Film/video editor (Medical):   [name] Lack of Transportation (Non-Medical):   Physical Activity:   . Days of Exercise per Week:   . Minutes of Exercise per Session:   Stress:   . Feeling of Stress :   Social Connections:   . Frequency of Communication with Friends and Family:   . Frequency  of Social Gatherings with Friends and Family:   . Attends Religious Services:   . Active Member of Clubs or Organizations:   . Attends Archivist Meetings:   [name] Marital Status:   Intimate Partner Violence:   . Fear of Current or Ex-Partner:   . Emotionally Abused:   [name] Physically Abused:   . Sexually Abused:     Past Surgical History:  Procedure Laterality Date  . AORTIC VALVE REPLACEMENT N/A 03/06/2019   Procedure: AORTIC VALVE REPLACEMENT (AVR), USING INTUITY 25MM;  Surgeon: Wonda Olds, MD;  Location: [facility];  Service: Open Heart Surgery;  Laterality: N/A;  . CARDIAC CATHETERIZATION    . CATARACT EXTRACTION    . CORONARY ARTERY BYPASS  GRAFT N/A 03/06/2019   Procedure: CORONARY ARTERY BYPASS GRAFTING (CABG), ON PUMP, TIMES THREE, USING BILATERAL INTERNAL MAMMARIES AND LEFT RADIAL ARTERY HARVEST;  Surgeon: Wonda Olds, MD;  Location: [facility];  Service: Open Heart Surgery;  Laterality: N/A;  BILATERAL IMA  . LEFT HEART CATH AND CORONARY ANGIOGRAPHY N/A 03/04/2019   Procedure: LEFT HEART CATH AND CORONARY ANGIOGRAPHY;  Surgeon: Burnell Blanks, MD;  Location: [facility];  Service: Cardiovascular;  Laterality: N/A;  . None    . RADIAL ARTERY HARVEST Left 03/06/2019   Procedure: RADIAL ARTERY HARVEST;  Surgeon: Wonda Olds, MD;  Location: [facility];  Service: Open Heart Surgery;  Laterality: Left;  . TEE WITHOUT CARDIOVERSION N/A 03/06/2019   Procedure: TRANSESOPHAGEAL ECHOCARDIOGRAM (TEE);  Surgeon: Wonda Olds, MD;  Location: [facility];  Service: Open Heart Surgery;  Laterality: N/A;    Family History  Adopted: Yes  Problem Relation Age of Onset  . Cancer Mother        breast cancer  . Alcohol abuse Father   . Breast cancer Sister     Allergies  Allergen Reactions  . Heparin     HIT antibody and SRA positive    Current Outpatient Medications on File Prior to Visit  Medication Sig Dispense Refill  . ALPRAZolam (XANAX) 0.5 MG tablet TAKE 1 TABLET BY MOUTH ONCE DAILY AS NEEDED FOR ANXIETY (TAKE  30  MINUTES  PRIOR  TO  PLANE  FLIGHT  OR  AS  NEEDED  FOR  ANXIETY) 20 tablet 0  . atorvastatin (LIPITOR) 80 MG tablet Take 1 tablet (80 mg total) by mouth daily. 30 tablet 3  . colchicine 0.6 MG tablet Take 2 tablets on first day of gout flare then 1 tablet daily until gout flare has resolved    . cyclobenzaprine (FLEXERIL) 10 MG tablet Take 1 tablet (10 mg total) by mouth at bedtime as needed for muscle spasms. 15 tablet 0  . ketoconazole (NIZORAL) 2 % shampoo Apply 1 application topically 2 (two) times a week. 120 mL 0  . latanoprost (XALATAN) 0.005 % ophthalmic solution Place 1 drop into both eyes every  morning.     . metoprolol tartrate (LOPRESSOR) 25 MG tablet Take 0.5 tablets (12.5 mg total) by mouth 2 (two) times daily. 30 tablet 3  . Misc Natural Products (OSTEO BI-FLEX/5-LOXIN ADVANCED PO) Take 2 tablets by mouth daily.     . sertraline (ZOLOFT) 50 MG tablet Take 1 tablet (50 mg total) by mouth daily. 30 tablet 3  . traMADol (ULTRAM) 50 MG tablet Take 1-2 tablets (50-100 mg total) by mouth every 4 (four) hours as needed for moderate pain. 30 tablet 0  . traZODone (DESYREL) 50 MG tablet Take 0.5-1 tablets (  25-50 mg total) by mouth at bedtime as needed for sleep. 30 tablet 3  . warfarin (COUMADIN) 2.5 MG tablet Take 1 tablet (2.5 mg total) by mouth daily. 30 tablet 3   No current facility-administered medications on file prior to visit.    BP 130/84   Pulse 89   Temp (!) 96.8 F (36 C) (Temporal)   Ht 5\' 7"  (1.702 m)   Wt 211 lb 8 oz (95.9 kg)   SpO2 98%   BMI 33.13 kg/m    Objective:   Physical Exam  Constitutional: He appears well-nourished.  Respiratory: Effort normal.  Genitourinary:    Prostate is not tender. Right testis shows swelling and tenderness. Right testis shows no mass. Left testis shows no mass, no swelling and no tenderness.    Genitourinary Comments: Chaperone present.   Moderate swelling with erythema to right scrotum. No nodules or obvious abnormality to prostate. Patient had no pain with prostate exam.   Skin: Skin is warm. There is erythema.           Assessment & Plan:

## 2019-06-10 NOTE — Assessment & Plan Note (Addendum)
Acute for the last 2 weeks with nocturia, darker and cloudy urine. No difficulty urinating.  Prostate exam unrevealing for acute process. UA today with 2+ leuks, positive nitrites, 2+ blood. Culture sent.  Given symptoms, coupled with UA results, will cover for acute cystitis vs prostatitis. Would avoid Cipro due to CAD and NSTEMI history. Cannot prescribe bactrim due to interaction with Coumadin. Rx for cephalexin course sent to pharmacy.   Consulted with PCP who agrees.

## 2019-06-10 NOTE — Patient Instructions (Signed)
Stop by the front desk and speak with either Rosaria Ferries or Charmaine regarding your ultrasound.  Stop by the lab prior to leaving today. I will notify you of your results once received.   It was a pleasure meeting you!

## 2019-06-10 NOTE — Assessment & Plan Note (Addendum)
Acute for the last 2 days, obvious swelling with erythema on exam.  US scrotum ordered and pending. CBC and BMP pending.  Unclear if urinary symptoms and scrotal mass are related, will cover for acute prostatitis vs scrotal cellulitis. Would avoid Cipro due to CAD with NSTEMI history. Cannot do Bactrim due to interaction with Coumadin.  Rx for cephalexin course sent to pharmacy.  Consulted with PCP who agrees.

## 2019-06-10 NOTE — Telephone Encounter (Signed)
What dental office are you calling from? [facility]   1. What is your office phone number? [phone]   2. What is your fax number? [phone]  3. What type of procedure is the patient having performed? Teeth cleaning  4. What date is procedure scheduled or is the patient there now? 06/18/19 (if the patient is at the dentist's office question goes to their cardiologist if he/she is in the office.  If not, question should go to the DOD).   5. What is your question (ex. Antibiotics prior to procedure, holding medication-we need to know how long dentist wants pt to hold med)? Dental office is calling to see if patient needs a pre med before cleaning

## 2019-06-11 ENCOUNTER — Ambulatory Visit (HOSPITAL_COMMUNITY)
Admission: RE | Admit: 2019-06-11 | Discharge: 2019-06-11 | Disposition: A | Discharge status: Home / Self Care | Payer: Medicare HMO | Source: Ambulatory Visit | Attending: Primary Care | Admitting: Primary Care

## 2019-06-11 DIAGNOSIS — N433 Hydrocele, unspecified: Secondary | ICD-10-CM | POA: Diagnosis not present

## 2019-06-11 DIAGNOSIS — N5089 Other specified disorders of the male genital organs: Secondary | ICD-10-CM

## 2019-06-11 LAB — CBC WITH DIFFERENTIAL/PLATELET
Basophils Absolute: 0.1 10*3/uL (ref 0.0–0.1)
Basophils Relative: 0.7 % (ref 0.0–3.0)
Eosinophils Absolute: 0 10*3/uL (ref 0.0–0.7)
Eosinophils Relative: 0.1 % (ref 0.0–5.0)
HCT: 31.9 % — ABNORMAL LOW (ref 39.0–52.0)
Hemoglobin: 10.3 g/dL — ABNORMAL LOW (ref 13.0–17.0)
Lymphocytes Relative: 11.7 % — ABNORMAL LOW (ref 12.0–46.0)
Lymphs Abs: 1.3 10*3/uL (ref 0.7–4.0)
MCHC: 32.2 g/dL (ref 30.0–36.0)
MCV: 92.6 fl (ref 78.0–100.0)
Monocytes Absolute: 1.1 10*3/uL — ABNORMAL HIGH (ref 0.1–1.0)
Monocytes Relative: 9.6 % (ref 3.0–12.0)
Neutro Abs: 8.6 10*3/uL — ABNORMAL HIGH (ref 1.4–7.7)
Neutrophils Relative %: 77.9 % — ABNORMAL HIGH (ref 43.0–77.0)
Platelets: 202 10*3/uL (ref 150.0–400.0)
RBC: 3.44 Mil/uL — ABNORMAL LOW (ref 4.22–5.81)
RDW: 18.4 % — ABNORMAL HIGH (ref 11.5–15.5)
WBC: 11.1 10*3/uL — ABNORMAL HIGH (ref 4.0–10.5)

## 2019-06-11 LAB — BASIC METABOLIC PANEL
BUN: 15 mg/dL (ref 6–23)
CO2: 27 mEq/L (ref 19–32)
Calcium: 9.2 mg/dL (ref 8.4–10.5)
Chloride: 99 mEq/L (ref 96–112)
Creatinine, Ser: 0.84 mg/dL (ref 0.40–1.50)
GFR: 90.13 mL/min (ref >60.00)
Glucose, Bld: 103 mg/dL — ABNORMAL HIGH (ref 70–99)
Potassium: 4.2 mEq/L (ref 3.5–5.1)
Sodium: 136 mEq/L (ref 135–145)

## 2019-06-12 ENCOUNTER — Other Ambulatory Visit: Payer: Self-pay

## 2019-06-12 ENCOUNTER — Ambulatory Visit (HOSPITAL_COMMUNITY): Payer: Medicare HMO | Attending: Cardiology

## 2019-06-12 DIAGNOSIS — Z952 Presence of prosthetic heart valve: Secondary | ICD-10-CM | POA: Diagnosis not present

## 2019-06-12 LAB — URINE CULTURE
MICRO NUMBER:: 10571294
SPECIMEN QUALITY:: ADEQUATE

## 2019-06-15 ENCOUNTER — Other Ambulatory Visit: Payer: Self-pay

## 2019-06-15 ENCOUNTER — Encounter (HOSPITAL_COMMUNITY)
Admission: RE | Admit: 2019-06-15 | Discharge: 2019-06-15 | Disposition: A | Discharge status: Home / Self Care | Payer: Medicare HMO | Source: Ambulatory Visit | Attending: Cardiology | Admitting: Cardiology

## 2019-06-15 ENCOUNTER — Telehealth: Payer: Self-pay

## 2019-06-15 DIAGNOSIS — Z952 Presence of prosthetic heart valve: Secondary | ICD-10-CM

## 2019-06-15 DIAGNOSIS — Z951 Presence of aortocoronary bypass graft: Secondary | ICD-10-CM | POA: Diagnosis not present

## 2019-06-15 NOTE — Telephone Encounter (Signed)
Spoke with patient. Informed patient of results and recommendations. Order placed for repeat echo in 6 months. Patient verbalized understanding.

## 2019-06-15 NOTE — Telephone Encounter (Signed)
-----   Message from [name], MD sent at 06/14/2019 10:29 AM EDT ----- I reviewed the images.  Small perivalvular leak.  I will follow this clinically and with repeat echocardiography perhaps in six months.  He has follow up with me in August in the clinic.  Overall stable valve replacement.  Call Mr. Bunda with the results and send results to Jinny Sanders, MD

## 2019-06-17 ENCOUNTER — Other Ambulatory Visit: Payer: Self-pay

## 2019-06-17 ENCOUNTER — Encounter (HOSPITAL_COMMUNITY)
Admission: RE | Admit: 2019-06-17 | Discharge: 2019-06-17 | Disposition: A | Discharge status: Home / Self Care | Payer: Medicare HMO | Source: Ambulatory Visit | Attending: Cardiology | Admitting: Cardiology

## 2019-06-17 VITALS — Wt 211.0 lb

## 2019-06-17 DIAGNOSIS — Z952 Presence of prosthetic heart valve: Secondary | ICD-10-CM | POA: Diagnosis not present

## 2019-06-17 DIAGNOSIS — Z951 Presence of aortocoronary bypass graft: Secondary | ICD-10-CM | POA: Diagnosis not present

## 2019-06-18 ENCOUNTER — Ambulatory Visit: Payer: Medicare HMO

## 2019-06-18 ENCOUNTER — Ambulatory Visit: Payer: Medicare HMO | Admitting: Podiatry

## 2019-06-18 ENCOUNTER — Other Ambulatory Visit: Payer: Self-pay

## 2019-06-18 ENCOUNTER — Ambulatory Visit (INDEPENDENT_AMBULATORY_CARE_PROVIDER_SITE_OTHER): Payer: Medicare HMO

## 2019-06-18 ENCOUNTER — Encounter: Payer: Self-pay | Admitting: Podiatry

## 2019-06-18 DIAGNOSIS — L600 Ingrowing nail: Secondary | ICD-10-CM

## 2019-06-18 DIAGNOSIS — Z7901 Long term (current) use of anticoagulants: Secondary | ICD-10-CM

## 2019-06-18 LAB — POCT INR: INR: 1.7 — AB (ref 2.0–3.0)

## 2019-06-18 NOTE — Telephone Encounter (Signed)
Pt requesting refill of flexeril. Pt is currently doing  PT and said he has had some back spasms. Last fill Jan 2021 Last apt 05/01/19 No future apt

## 2019-06-18 NOTE — Progress Notes (Signed)
This patient returns to my office for at risk foot care.  This patient requires this care by a professional since this patient will be at risk due to having lymphedema, and coagulation defect.  Patient is taking coumadin.  This patient is unable to cut nails himself since the patient cannot reach his nails.These nails are painful walking and wearing shoes. Patient was seen 6 weeks ago and has developed an inflammation due to nail spicule outside border left foot. This patient presents for at risk foot care today.  General Appearance  Alert, conversant and in no acute stress.  Vascular  Dorsalis pedis and posterior tibial  pulses are weakly  palpable due to swelling both feet. bilaterally.  Capillary return is within normal limits  bilaterally. Temperature is within normal limits  bilaterally.  Neurologic  Senn-Weinstein monofilament wire test within normal limits  bilaterally. Muscle power within normal limits bilaterally.  Nails Thick disfigured discolored nails with subungual debris  Hallux nails  B/L.  Pincer nails hallux  B/L. No evidence of bacterial infection or drainage bilaterally.  Pain at the distal aspect lateral border left hallux.  Orthopedic  No limitations of motion  feet .  No crepitus or effusions noted.  No bony pathology or digital deformities noted.  Skin  normotropic skin with no porokeratosis noted bilaterally.  No signs of infections or ulcers noted.     Nail spicule left hallux.  Consent was obtained for treatment procedures.   Debridement of ingrown toenail lateral border left hallux.   Return office visit    Already scheduled. For preventative foot care services.                 Told patient to return for periodic foot care and evaluation due to potential at risk complications.   Gardiner Barefoot DPM

## 2019-06-18 NOTE — Patient Instructions (Addendum)
Pre visit review using our clinic review tool, if applicable. No additional management support is needed unless otherwise documented below in the visit note.  Increase dose today to 2.5 mg then change weekly does to take 1.25 daily except take 2.5mg  on Mon, Wed, Fri. Recheck in 3 wks.

## 2019-06-19 MED ORDER — CYCLOBENZAPRINE HCL 10 MG PO TABS: 10.0000 mg | ORAL_TABLET | Freq: Every evening | ORAL | 0 refills | Status: DC | PRN

## 2019-06-19 NOTE — Telephone Encounter (Signed)
Refill sent as requested.  Left message for Mr. Snodgrass that refill has been sent to his pharmacy.

## 2019-06-22 ENCOUNTER — Other Ambulatory Visit: Payer: Self-pay

## 2019-06-22 ENCOUNTER — Encounter (HOSPITAL_COMMUNITY)
Admission: RE | Admit: 2019-06-22 | Discharge: 2019-06-22 | Disposition: A | Discharge status: Home / Self Care | Payer: Medicare HMO | Source: Ambulatory Visit | Attending: Cardiology | Admitting: Cardiology

## 2019-06-22 DIAGNOSIS — Z951 Presence of aortocoronary bypass graft: Secondary | ICD-10-CM | POA: Diagnosis not present

## 2019-06-22 DIAGNOSIS — Z952 Presence of prosthetic heart valve: Secondary | ICD-10-CM | POA: Diagnosis not present

## 2019-06-22 MED ORDER — METOPROLOL TARTRATE 25 MG PO TABS: 12.5000 mg | ORAL_TABLET | Freq: Two times a day (BID) | ORAL | 3 refills | Status: DC

## 2019-06-22 MED ORDER — ATORVASTATIN CALCIUM 80 MG PO TABS: 80.0000 mg | ORAL_TABLET | Freq: Every day | ORAL | 3 refills | Status: DC

## 2019-06-23 NOTE — Progress Notes (Signed)
Cardiac Individual Treatment Plan  Patient Details  Name: COLE EASTRIDGE MRN: [id number] Date of Birth: July 04, 1950 Referring Provider:     [place] from 05/26/2019 in [facility]  Referring Provider [name], MD      Initial Encounter Date:    CARDIAC REHAB PHASE II ORIENTATION from 05/26/2019 in [facility]  Date 05/26/19      Visit Diagnosis: S/P CABG x 2 03/06/19  S/P AVR (aortic valve replacement) 03/06/19  Patient's Home Medications on Admission:  Current Outpatient Medications:  .  ALPRAZolam (XANAX) 0.5 MG tablet, TAKE 1 TABLET BY MOUTH ONCE DAILY AS NEEDED FOR ANXIETY (TAKE  30  MINUTES  PRIOR  TO  PLANE  FLIGHT  OR  AS  NEEDED  FOR  ANXIETY), Disp: 20 tablet, Rfl: 0 .  atorvastatin (LIPITOR) 80 MG tablet, Take 1 tablet (80 mg total) by mouth daily., Disp: 30 tablet, Rfl: 3 .  colchicine 0.6 MG tablet, Take 2 tablets on first day of gout flare then 1 tablet daily until gout flare has resolved, Disp: , Rfl:  .  cyclobenzaprine (FLEXERIL) 10 MG tablet, Take 1 tablet (10 mg total) by mouth at bedtime as needed for muscle spasms., Disp: 15 tablet, Rfl: 0 .  ketoconazole (NIZORAL) 2 % shampoo, Apply 1 application topically 2 (two) times a week., Disp: 120 mL, Rfl: 0 .  latanoprost (XALATAN) 0.005 % ophthalmic solution, Place 1 drop into both eyes every morning. , Disp: , Rfl:  .  metoprolol tartrate (LOPRESSOR) 25 MG tablet, Take 0.5 tablets (12.5 mg total) by mouth 2 (two) times daily., Disp: 30 tablet, Rfl: 3 .  Misc Natural Products (OSTEO BI-FLEX/5-LOXIN ADVANCED PO), Take 2 tablets by mouth daily. , Disp: , Rfl:  .  sertraline (ZOLOFT) 50 MG tablet, Take 1 tablet (50 mg total) by mouth daily., Disp: 30 tablet, Rfl: 3 .  traMADol (ULTRAM) 50 MG tablet, Take 1-2 tablets (50-100 mg total) by mouth every 4 (four) hours as needed for moderate pain., Disp: 30 tablet, Rfl: 0 .  traZODone  (DESYREL) 50 MG tablet, Take 0.5-1 tablets (25-50 mg total) by mouth at bedtime as needed for sleep., Disp: 30 tablet, Rfl: 3 .  warfarin (COUMADIN) 2.5 MG tablet, Take 1 tablet (2.5 mg total) by mouth daily., Disp: 30 tablet, Rfl: 3  Past Medical History: Past Medical History:  Diagnosis Date  . Alcohol abuse   . Aortic valve disease    AI and AS, Moderate  . Chest pain 03/2019  . Coronary artery disease   . Drug use   . Edema of both lower extremities   . Glaucoma   . Hypertension   . OSA (obstructive sleep apnea)   . Shingles     Tobacco Use: Social History   Tobacco Use  Smoking Status Never Smoker  Smokeless Tobacco Never Used    Labs: Recent Review Flowsheet Data    Labs for ITP Cardiac and Pulmonary Rehab Latest Ref Rng & Units 03/06/2019 03/06/2019 03/06/2019 03/07/2019 03/08/2019   Cholestrol 100 - 199 mg/dL - - - - -   LDLCALC 0 - 99 mg/dL - - - - -   LDLDIRECT mg/dL - - - - -   HDL >39 mg/dL - - - - -   Trlycerides 0 - 149 mg/dL - - - - -   Hemoglobin A1c 4.8 - 5.6 % - - - - -   PHART 7.35 -  7.45 7.351 7.277(L) 7.328(L) 7.329(L) -   PCO2ART 32 - 48 mmHg 40.4 49.2(H) 44.8 42.8 -   HCO3 20.0 - 28.0 mmol/L 22.3 22.8 23.4 22.3 -   TCO2 22 - 32 mmol/L 24 24 25 24 30    ACIDBASEDEF 0.0 - 2.0 mmol/L 3.0(H) 4.0(H) 2.0 3.0(H) -   O2SAT % 95.0 89.0 96.0 91.0 -      Capillary Blood Glucose: Lab Results  Component Value Date   GLUCAP 89 03/20/2019   GLUCAP 104 (H) 03/19/2019   GLUCAP 116 (H) 03/19/2019   GLUCAP 120 (H) 03/19/2019   GLUCAP 97 03/19/2019     Exercise Target Goals: Exercise Program Goal: Individual exercise prescription set using results from initial 6 min walk test and THRR while considering  patient's activity barriers and safety.   Exercise Prescription Goal: Starting with aerobic activity 30 plus minutes a day, 3 days per week for initial exercise prescription. Provide home exercise prescription and guidelines that participant acknowledges  understanding prior to discharge.  Activity Barriers & Risk Stratification:  Activity Barriers & Cardiac Risk Stratification - 05/26/19 1416      Activity Barriers & Cardiac Risk Stratification   Activity Barriers Other (comment);Assistive Device    Comments Bilateral knee pain.    Cardiac Risk Stratification High           6 Minute Walk:  6 Minute Walk    Row Name 05/26/19 1426         6 Minute Walk   Phase Initial     Distance 1006 feet     Walk Time 6 minutes     # of Rest Breaks 0     MPH 1.91     METS 2.15     RPE 11     Perceived Dyspnea  1     VO2 Peak 7.53     Symptoms Yes (comment)     Comments Patient c/o mild shortness of breath because of facial mask.     Resting HR 61 bpm     Resting BP 128/72     Resting Oxygen Saturation  99 %     Exercise Oxygen Saturation  during 6 min walk 94 %     Max Ex. HR 97 bpm     Max Ex. BP 148/82     2 Minute Post BP 138/70            Oxygen Initial Assessment:   Oxygen Re-Evaluation:   Oxygen Discharge (Final Oxygen Re-Evaluation):   Initial Exercise Prescription:  Initial Exercise Prescription - 05/26/19 1500      Date of Initial Exercise RX and Referring Provider   Date 05/26/19    Referring Provider [name], MD    Expected Discharge Date 07/22/19      Bike   Level 1.5    Minutes 15    METs 2      NuStep   Level 2    SPM 85    Minutes 15    METs 2      Prescription Details   Frequency (times per week) 3    Duration Progress to 30 minutes of continuous aerobic without signs/symptoms of physical distress      Intensity   THRR 40-80% of Max Heartrate 60-120    Ratings of Perceived Exertion 11-13    Perceived Dyspnea 0-4      Progression   Progression Continue to progress workloads to maintain intensity without signs/symptoms of physical distress.  Resistance Training   Training Prescription Yes    Weight 3lbs    Reps 10-15           Perform Capillary Blood Glucose checks  as needed.  Exercise Prescription Changes:   Exercise Prescription Changes    Row Name 06/03/19 1300 06/24/19 1400           Response to Exercise   Blood Pressure (Admit) 140/72 122/60      Blood Pressure (Exercise) 118/72 124/62      Blood Pressure (Exit) 114/70 108/70      Heart Rate (Admit) 70 bpm 63 bpm      Heart Rate (Exercise) 78 bpm 70 bpm      Heart Rate (Exit) 66 bpm 61 bpm      Rating of Perceived Exertion (Exercise) 11 12      Perceived Dyspnea (Exercise) 0 0      Symptoms None None      Comments Changed Prescription to 30 minutes on Nustep Reviewed Home Exercise Program      Duration Progress to 10 minutes continuous walking  at current work load and total walking time to 30-45 min Progress to 10 minutes continuous walking  at current work load and total walking time to 30-45 min      Intensity THRR unchanged THRR unchanged        Progression   Progression Continue to progress workloads to maintain intensity without signs/symptoms of physical distress. Continue to progress workloads to maintain intensity without signs/symptoms of physical distress.      Average METs 1.8 2.7        Resistance Training   Training Prescription No No        NuStep   Level 2 4      SPM 85 95      Minutes 30 30      METs 1.8 2.7        Home Exercise Plan   Plans to continue exercise at -- Home (comment)  Chair Exercises, Stretches      Frequency -- Add 2 additional days to program exercise sessions.      Initial Home Exercises Provided -- 06/24/19             Exercise Comments:   Exercise Comments    Row Name 06/03/19 1340 06/24/19 1444 06/24/19 1446       Exercise Comments Pt's first day of exercise. Pt responded well to exercise prescription. Upright recumbent bike was not comfortable for pt. Changed prescription to Nustep for 25-30 minutes. Will continue to monitor and progress workloads. -- Reviewed Home Exercise Program. Pt is not currently exercising. Discussed with pt  diffferent options to help incorporate exercise into his daily routine. Will continue to monitor.            Exercise Goals and Review:   Exercise Goals    Row Name 05/26/19 1418             Exercise Goals   Increase Physical Activity Yes       Intervention Provide advice, education, support and counseling about physical activity/exercise needs.;Develop an individualized exercise prescription for aerobic and resistive training based on initial evaluation findings, risk stratification, comorbidities and participant's personal goals.       Expected Outcomes Short Term: Attend rehab on a regular basis to increase amount of physical activity.;Long Term: Add in home exercise to make exercise part of routine and to increase amount of physical activity.;Long Term: Exercising regularly at  least 3-5 days a week.       Increase Strength and Stamina Yes       Intervention Provide advice, education, support and counseling about physical activity/exercise needs.;Develop an individualized exercise prescription for aerobic and resistive training based on initial evaluation findings, risk stratification, comorbidities and participant's personal goals.       Expected Outcomes Short Term: Increase workloads from initial exercise prescription for resistance, speed, and METs.;Short Term: Perform resistance training exercises routinely during rehab and add in resistance training at home;Long Term: Improve cardiorespiratory fitness, muscular endurance and strength as measured by increased METs and functional capacity (6MWT)       Able to understand and use rate of perceived exertion (RPE) scale Yes       Intervention Provide education and explanation on how to use RPE scale       Expected Outcomes Short Term: Able to use RPE daily in rehab to express subjective intensity level;Long Term:  Able to use RPE to guide intensity level when exercising independently       Knowledge and understanding of Target Heart Rate  Range (THRR) Yes       Intervention Provide education and explanation of THRR including how the numbers were predicted and where they are located for reference       Expected Outcomes Short Term: Able to state/look up THRR;Long Term: Able to use THRR to govern intensity when exercising independently;Short Term: Able to use daily as guideline for intensity in rehab       Able to check pulse independently Yes       Intervention Provide education and demonstration on how to check pulse in carotid and radial arteries.;Review the importance of being able to check your own pulse for safety during independent exercise       Expected Outcomes Short Term: Able to explain why pulse checking is important during independent exercise;Long Term: Able to check pulse independently and accurately       Understanding of Exercise Prescription Yes       Intervention Provide education, explanation, and written materials on patient's individual exercise prescription       Expected Outcomes Short Term: Able to explain program exercise prescription;Long Term: Able to explain home exercise prescription to exercise independently              Exercise Goals Re-Evaluation :  Exercise Goals Re-Evaluation    Row Name 06/24/19 1502             Exercise Goal Re-Evaluation   Exercise Goals Review Increase Physical Activity;Increase Strength and Stamina;Able to understand and use rate of perceived exertion (RPE) scale;Knowledge and understanding of Target Heart Rate Range (THRR);Able to check pulse independently;Understanding of Exercise Prescription       Comments Reviewed HEP with pt. Also discussed THRR, RPE Scale, weather precuations, NTG use, endpoints of exercise, warmup and cool down and strength training.       Expected Outcomes Pt will continue to do stretches at home. Pt is limited with exercise choices due to orthopedic limitations. Pt is unable to walk for 10 minutes at a time. Pt is considering joining local  [facility] with [employer] to have access to water aerobics. Encouraged pt to use that resource to help with home exercise. Will continue to monitor.               Discharge Exercise Prescription (Final Exercise Prescription Changes):  Exercise Prescription Changes - 06/24/19 1400      Response to  Exercise   Blood Pressure (Admit) 122/60    Blood Pressure (Exercise) 124/62    Blood Pressure (Exit) 108/70    Heart Rate (Admit) 63 bpm    Heart Rate (Exercise) 70 bpm    Heart Rate (Exit) 61 bpm    Rating of Perceived Exertion (Exercise) 12    Perceived Dyspnea (Exercise) 0    Symptoms None    Comments Reviewed Home Exercise Program    Duration Progress to 10 minutes continuous walking  at current work load and total walking time to 30-45 min    Intensity THRR unchanged      Progression   Progression Continue to progress workloads to maintain intensity without signs/symptoms of physical distress.    Average METs 2.7      Resistance Training   Training Prescription No      NuStep   Level 4    SPM 95    Minutes 30    METs 2.7      Home Exercise Plan   Plans to continue exercise at Home (comment)   Chair Exercises, Stretches   Frequency Add 2 additional days to program exercise sessions.    Initial Home Exercises Provided 06/24/19           Nutrition:  Target Goals: Understanding of nutrition guidelines, daily intake of sodium 1500mg , cholesterol 200mg , calories 30% from fat and 7% or less from saturated fats, daily to have 5 or more servings of fruits and vegetables.  Biometrics:  Pre Biometrics - 05/26/19 1423      Pre Biometrics   Height 5\' 7"  (1.702 m)    Weight 96.1 kg    Waist Circumference 43 inches    Hip Circumference 44.25 inches    Waist to Hip Ratio 0.97 %    BMI (Calculated) 33.17    Triceps Skinfold 16 mm    % Body Fat 31.5 %    Grip Strength 25.5 kg    Flexibility 14 in    Single Leg Stand 0.81 seconds            Nutrition Therapy Plan and  Nutrition Goals:  Nutrition Therapy & Goals - 06/03/19 1438      Nutrition Therapy   Diet Heart Healthy      Personal Nutrition Goals   Nutrition Goal Pt to identify food quantities necessary to achieve weight loss of 6-24 lb at graduation from cardiac rehab.    Personal Goal #2 Pt to build a healthy plate including vegetables, fruits, whole grains, and low-fat dairy products in a heart healthy meal plan.      Intervention Plan   Intervention Nutrition handout(s) given to patient.;Prescribe, educate and counsel regarding individualized specific dietary modifications aiming towards targeted core components such as weight, hypertension, lipid management, diabetes, heart failure and other comorbidities.    Expected Outcomes Short Term Goal: A plan has been developed with personal nutrition goals set during dietitian appointment.;Long Term Goal: Adherence to prescribed nutrition plan.           Nutrition Assessments:  Nutrition Assessments - 06/03/19 1442      MEDFICTS Scores   Pre Score 46           Nutrition Goals Re-Evaluation:  Nutrition Goals Re-Evaluation    Row Name 06/03/19 1439 06/25/19 0823           Goals   Current Weight 211 lb (95.7 kg) 210 lb 15.7 oz (95.7 kg)      Nutrition Goal Pt to  identify food quantities necessary to achieve weight loss of 6-24 lb at graduation from cardiac rehab. Pt to identify food quantities necessary to achieve weight loss of 6-24 lb at graduation from cardiac rehab.        Personal Goal #2 Re-Evaluation   Personal Goal #2 Pt to build a healthy plate including vegetables, fruits, whole grains, and low-fat dairy products in a heart healthy meal plan. Pt to build a healthy plate including vegetables, fruits, whole grains, and low-fat dairy products in a heart healthy meal plan.             Nutrition Goals Discharge (Final Nutrition Goals Re-Evaluation):  Nutrition Goals Re-Evaluation - 06/25/19 1751      Goals   Current Weight 210  lb 15.7 oz (95.7 kg)    Nutrition Goal Pt to identify food quantities necessary to achieve weight loss of 6-24 lb at graduation from cardiac rehab.      Personal Goal #2 Re-Evaluation   Personal Goal #2 Pt to build a healthy plate including vegetables, fruits, whole grains, and low-fat dairy products in a heart healthy meal plan.           Psychosocial: Target Goals: Acknowledge presence or absence of significant depression and/or stress, maximize coping skills, provide positive support system. Participant is able to verbalize types and ability to use techniques and skills needed for reducing stress and depression.  Initial Review & Psychosocial Screening:  Initial Psych Review & Screening - 05/26/19 1531      Initial Review   Current issues with Current Stress Concerns    Source of Stress Concerns Chronic Illness    Comments Ned did experience some stress due to his recent hosptalization, HIT and hematoma. Festus Aloe is feeling better now.      Family Dynamics   Good Support System? Yes   Festus Aloe has his wife for support     Barriers   Psychosocial barriers to participate in program There are no identifiable barriers or psychosocial needs.      Screening Interventions   Interventions Encouraged to exercise           Quality of Life Scores:  Quality of Life - 05/26/19 1557      Quality of Life   Select Quality of Life      Quality of Life Scores   Health/Function Pre 20.63 %    Socioeconomic Pre 22.29 %    Psych/Spiritual Pre 21.93 %    Family Pre 24 %    GLOBAL Pre 21.74 %          Scores of 19 and below usually indicate a poorer quality of life in these areas.  A difference of  2-3 points is a clinically meaningful difference.  A difference of 2-3 points in the total score of the Quality of Life Index has been associated with significant improvement in overall quality of life, self-image, physical symptoms, and general health in studies assessing change in quality of  life.  PHQ-9: Recent Review Flowsheet Data    Depression screen [facility] 2/9 05/26/2019 08/14/2018 12/18/2016 01/11/2015   Decreased Interest 0 3 1 0   Down, Depressed, Hopeless 0 1 1 0   PHQ - 2 Score 0 4 2 0   Altered sleeping - 3 - -   Tired, decreased energy - 3 - -   Change in appetite - 1 - -   Trouble concentrating - 1 - -   Moving slowly or fidgety/restless - 3 - -  Suicidal thoughts - 0 - -   PHQ-9 Score - 15 - -   Difficult doing work/chores - Somewhat difficult - -     Interpretation of Total Score  Total Score Depression Severity:  1-4 = Minimal depression, 5-9 = Mild depression, 10-14 = Moderate depression, 15-19 = Moderately severe depression, 20-27 = Severe depression   Psychosocial Evaluation and Intervention:   Psychosocial Re-Evaluation:  Psychosocial Re-Evaluation    [place] Name 06/23/19 1553             Psychosocial Re-Evaluation   Current issues with Current Stress Concerns       Comments Festus Aloe has not voiced having increased stress or concerns during exercise at cardiac rehab       Expected Outcomes Will continue to offer support as needed       Interventions Encouraged to attend Cardiac Rehabilitation for the exercise       Comments Ned did experience some stress due to his recent hosptalization, HIT and hematoma. Festus Aloe is feeling better now.         Initial Review   Source of Stress Concerns Chronic Illness              Psychosocial Discharge (Final Psychosocial Re-Evaluation):  Psychosocial Re-Evaluation - 06/23/19 1553      Psychosocial Re-Evaluation   Current issues with Current Stress Concerns    Comments Festus Aloe has not voiced having increased stress or concerns during exercise at cardiac rehab    Expected Outcomes Will continue to offer support as needed    Interventions Encouraged to attend Cardiac Rehabilitation for the exercise    Comments Ned did experience some stress due to his recent hosptalization, HIT and hematoma. Festus Aloe is feeling better now.       Initial Review   Source of Stress Concerns Chronic Illness           Vocational Rehabilitation: Provide vocational rehab assistance to qualifying candidates.   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 Participant will state understanding/return demonstration of topics presented.  Learning Barriers/Preferences:  Learning Barriers/Preferences - 05/27/19 0826      Learning Barriers/Preferences   Learning Barriers Sight   wears reading glasses   Learning Preferences Pictoral           Education Topics: Hypertension, Hypertension Reduction -Define heart disease and high blood pressure. Discus how high blood pressure affects the body and ways to reduce high blood pressure.   Exercise and Your Heart -Discuss why it is important to exercise, the FITT principles of exercise, normal and abnormal responses to exercise, and how to exercise safely.   Angina -Discuss definition of angina, causes of angina, treatment of angina, and how to decrease risk of having angina.   Cardiac Medications -Review what the following cardiac medications are used for, how they affect the body, and side effects that may occur when taking the medications.  Medications include Aspirin, Beta blockers, calcium channel blockers, ACE Inhibitors, angiotensin receptor blockers, diuretics, digoxin, and antihyperlipidemics.   Congestive Heart Failure -Discuss the definition of CHF, how to live with CHF, the signs and  symptoms of CHF, and how keep track of weight and sodium intake.   Heart Disease and Intimacy -Discus the effect  sexual activity has on the heart, how changes occur during intimacy as we age, and safety during sexual activity.   Smoking Cessation / COPD -Discuss different methods to quit smoking, the health benefits of quitting smoking, and the definition of COPD.   Nutrition I: Fats -Discuss the types of cholesterol, what cholesterol does to the heart, and how cholesterol levels can be controlled.   Nutrition II: Labels -Discuss the different components of food labels and how to read food label   Heart Parts/Heart Disease and PAD -Discuss the anatomy of the heart, the pathway of blood circulation through the heart, and these are affected by heart disease.   Stress I: Signs and Symptoms -Discuss the causes of stress, how stress may lead to anxiety and depression, and ways to limit stress.   Stress II: Relaxation -Discuss different types of relaxation techniques to limit stress.   Warning Signs of Stroke / TIA -Discuss definition of a stroke, what the signs and symptoms are of a stroke, and how to identify when someone is having stroke.   Knowledge Questionnaire Score:  Knowledge Questionnaire Score - 05/26/19 1525      Knowledge Questionnaire Score   Pre Score 19/24           Core Components/Risk Factors/Patient Goals at Admission:  Personal Goals and Risk Factors at Admission - 05/26/19 1558      Core Components/Risk Factors/Patient Goals on Admission    Weight Management Yes;Obesity    Intervention Weight Management/Obesity: Establish reasonable short term and long term weight goals.;Obesity: Provide education and appropriate resources to help participant work on and attain dietary goals.    Admit Weight 211 lb 13.8 oz (96.1 kg)    Expected Outcomes Short Term: Continue to assess and modify interventions until short term weight is achieved;Long Term: Adherence to nutrition and physical activity/exercise program aimed toward attainment of established weight goal;Weight  Maintenance: Understanding of the daily nutrition guidelines, which includes 25-35% calories from fat, 7% or less cal from saturated fats, less than 200mg  cholesterol, less than 1.5gm of sodium, & 5 or more servings of fruits and vegetables daily;Understanding recommendations for meals to include 15-35% energy as protein, 25-35% energy from fat, 35-60% energy from carbohydrates, less than 200mg  of dietary cholesterol, 20-35 gm of total fiber daily;Understanding of distribution of calorie intake throughout the day with the consumption of 4-5 meals/snacks    Hypertension Yes    Intervention Provide education on lifestyle modifcations including regular physical activity/exercise, weight management, moderate sodium restriction and increased consumption of fresh fruit, vegetables, and low fat dairy, alcohol moderation, and smoking cessation.;Monitor prescription use compliance.    Expected Outcomes Short Term: Continued assessment and intervention until BP is < 140/85mm HG in hypertensive participants. < 130/30mm HG in hypertensive participants with diabetes, heart failure or chronic kidney disease.;Long Term: Maintenance of blood pressure at goal levels.    Lipids Yes    Intervention Provide education and support for participant on nutrition & aerobic/resistive exercise along with prescribed medications to achieve LDL 70mg , HDL >40mg .    Expected Outcomes Short Term: Participant states understanding of desired cholesterol values and is compliant with medications prescribed. Participant is following exercise prescription and nutrition guidelines.;Long Term: Cholesterol controlled with medications as prescribed, with individualized exercise RX and with personalized nutrition plan. Value goals: LDL < 70mg , HDL > 40 mg.  Core Components/Risk Factors/Patient Goals Review:   Goals and Risk Factor Review    Row Name 06/03/19 1215 06/23/19 1601           Core Components/Risk Factors/Patient Goals  Review   Personal Goals Review Weight Management/Obesity;Lipids;Hypertension Weight Management/Obesity;Lipids;Hypertension      Review Ned started exercise on 06/03/19 without difficulty Festus Aloe is doing well with exercise twice a week. Ned's vital signs have been stable.      Expected Outcomes Festus Aloe will continue to participate in for exercise, nutrition and lifestyle modifications Festus Aloe will continue to participate in for exercise, nutrition and lifestyle modifications             Core Components/Risk Factors/Patient Goals at Discharge (Final Review):   Goals and Risk Factor Review - 06/23/19 1601      Core Components/Risk Factors/Patient Goals Review   Personal Goals Review Weight Management/Obesity;Lipids;Hypertension    Review Festus Aloe is doing well with exercise twice a week. Ned's vital signs have been stable.    Expected Outcomes Ned will continue to participate in for exercise, nutrition and lifestyle modifications           ITP Comments:  ITP Comments    Row Name 05/26/19 1424 06/03/19 1214 06/23/19 1549       ITP Comments Dr Fransico Him MD, Medical Director 30 Day ITP Review. Ned started exercise at cardiac rehab on 06/03/19 and exercised without diffculty. 30 Day ITP Review. Festus Aloe is doing well with exercise at cardiac rehab despite having chronic bilater knee pain. Attendance is good.            Comments: See ITP comments.Barnet Pall, RN,BSN 06/25/2019 3:12 PM

## 2019-06-24 ENCOUNTER — Other Ambulatory Visit: Payer: Self-pay

## 2019-06-24 ENCOUNTER — Encounter (HOSPITAL_COMMUNITY)
Admission: RE | Admit: 2019-06-24 | Discharge: 2019-06-24 | Disposition: A | Discharge status: Home / Self Care | Payer: Medicare HMO | Source: Ambulatory Visit | Attending: Cardiology | Admitting: Cardiology

## 2019-06-24 DIAGNOSIS — Z951 Presence of aortocoronary bypass graft: Secondary | ICD-10-CM

## 2019-06-24 DIAGNOSIS — Z952 Presence of prosthetic heart valve: Secondary | ICD-10-CM | POA: Diagnosis not present

## 2019-06-24 NOTE — Progress Notes (Signed)
I have reviewed a Home Exercise Prescription with Gregory [name] . Gregory [name] is not currently exercising at home. Pt is active with work and currently doing stretches at home. Pt is limited with exercise choices due to orthopedic limitations. Pt is unable to walk for 10 minutes at a time. Pt is considering joining [facility] [employer] with Gregory [name] to have access to water aerobics. Encouraged pt to use that resource to help with home exercise.   Gregory [name] and I discussed how to progress their exercise prescription. The patient stated that their goals were to increase upper body strength and increase stamina. Explained the importance of aerobic base exercise to help increase strength and stamina. The patient stated that they understand the exercise prescription.  We reviewed exercise guidelines, target heart rate during exercise, RPE Scale, weather conditions, NTG use, endpoints for exercise, warmup and cool down.  Patient is encouraged to come to me with any questions. I will continue to follow up with the patient to assist them with progression and safety.    Gregory Lair MS, ACSM CEP 3:10 PM 06/24/2019

## 2019-06-29 ENCOUNTER — Encounter (HOSPITAL_COMMUNITY)
Admission: RE | Admit: 2019-06-29 | Discharge: 2019-06-29 | Disposition: A | Discharge status: Home / Self Care | Payer: Medicare HMO | Source: Ambulatory Visit | Attending: Cardiology | Admitting: Cardiology

## 2019-06-29 ENCOUNTER — Other Ambulatory Visit: Payer: Self-pay

## 2019-06-29 DIAGNOSIS — Z952 Presence of prosthetic heart valve: Secondary | ICD-10-CM | POA: Diagnosis not present

## 2019-06-29 DIAGNOSIS — Z951 Presence of aortocoronary bypass graft: Secondary | ICD-10-CM | POA: Diagnosis not present

## 2019-07-01 ENCOUNTER — Other Ambulatory Visit: Payer: Self-pay

## 2019-07-01 ENCOUNTER — Encounter (HOSPITAL_COMMUNITY)
Admission: RE | Admit: 2019-07-01 | Discharge: 2019-07-01 | Disposition: A | Discharge status: Home / Self Care | Payer: Medicare HMO | Source: Ambulatory Visit | Attending: Cardiology | Admitting: Cardiology

## 2019-07-01 DIAGNOSIS — Z952 Presence of prosthetic heart valve: Secondary | ICD-10-CM | POA: Diagnosis not present

## 2019-07-01 DIAGNOSIS — Z951 Presence of aortocoronary bypass graft: Secondary | ICD-10-CM

## 2019-07-08 ENCOUNTER — Other Ambulatory Visit: Payer: Self-pay

## 2019-07-08 ENCOUNTER — Encounter (HOSPITAL_COMMUNITY)
Admission: RE | Admit: 2019-07-08 | Discharge: 2019-07-08 | Disposition: A | Discharge status: Home / Self Care | Payer: Medicare HMO | Source: Ambulatory Visit | Attending: Cardiology | Admitting: Cardiology

## 2019-07-08 DIAGNOSIS — Z951 Presence of aortocoronary bypass graft: Secondary | ICD-10-CM | POA: Diagnosis not present

## 2019-07-08 DIAGNOSIS — Z952 Presence of prosthetic heart valve: Secondary | ICD-10-CM | POA: Insufficient documentation

## 2019-07-09 ENCOUNTER — Ambulatory Visit (INDEPENDENT_AMBULATORY_CARE_PROVIDER_SITE_OTHER): Payer: Medicare HMO

## 2019-07-09 DIAGNOSIS — Z7901 Long term (current) use of anticoagulants: Secondary | ICD-10-CM

## 2019-07-09 LAB — POCT INR: INR: 1.2 — AB (ref 2.0–3.0)

## 2019-07-09 NOTE — Patient Instructions (Addendum)
Pre visit review using our clinic review tool, if applicable. No additional management support is needed unless otherwise documented below in the visit note.  Increase dose today to 2.5 mg then change weekly does to take 2.5 daily except take 1.25 mg on Tues and Thurs.  Recheck in 2 wks.

## 2019-07-13 ENCOUNTER — Encounter (HOSPITAL_COMMUNITY)
Admission: RE | Admit: 2019-07-13 | Discharge: 2019-07-13 | Disposition: A | Discharge status: Home / Self Care | Payer: Medicare HMO | Source: Ambulatory Visit | Attending: Cardiology | Admitting: Cardiology

## 2019-07-13 ENCOUNTER — Other Ambulatory Visit: Payer: Self-pay

## 2019-07-13 DIAGNOSIS — Z952 Presence of prosthetic heart valve: Secondary | ICD-10-CM | POA: Diagnosis not present

## 2019-07-13 DIAGNOSIS — Z951 Presence of aortocoronary bypass graft: Secondary | ICD-10-CM | POA: Diagnosis not present

## 2019-07-15 ENCOUNTER — Other Ambulatory Visit: Payer: Self-pay

## 2019-07-15 ENCOUNTER — Encounter (HOSPITAL_COMMUNITY)
Admission: RE | Admit: 2019-07-15 | Discharge: 2019-07-15 | Disposition: A | Discharge status: Home / Self Care | Payer: Medicare HMO | Source: Ambulatory Visit | Attending: Cardiology | Admitting: Cardiology

## 2019-07-15 VITALS — Ht 67.0 in | Wt 217.4 lb

## 2019-07-15 DIAGNOSIS — Z951 Presence of aortocoronary bypass graft: Secondary | ICD-10-CM | POA: Diagnosis not present

## 2019-07-15 DIAGNOSIS — Z952 Presence of prosthetic heart valve: Secondary | ICD-10-CM | POA: Diagnosis not present

## 2019-07-20 ENCOUNTER — Other Ambulatory Visit: Payer: Self-pay

## 2019-07-20 ENCOUNTER — Encounter (HOSPITAL_COMMUNITY)
Admission: RE | Admit: 2019-07-20 | Discharge: 2019-07-20 | Disposition: A | Discharge status: Home / Self Care | Payer: Medicare HMO | Source: Ambulatory Visit | Attending: Cardiology | Admitting: Cardiology

## 2019-07-20 DIAGNOSIS — Z951 Presence of aortocoronary bypass graft: Secondary | ICD-10-CM | POA: Diagnosis not present

## 2019-07-20 DIAGNOSIS — Z952 Presence of prosthetic heart valve: Secondary | ICD-10-CM | POA: Diagnosis not present

## 2019-07-20 NOTE — Progress Notes (Signed)
Discharge Progress Report  Patient Details  Name: Gregory [name] MRN: [id number] Date of Birth: 1950/04/06 Referring Provider:     [place] from 05/26/2019 in [place]  Referring Provider [name], MD        Number of Visits: 14  Reason for Discharge:  Patient reached a stable level of exercise. Patient independent in their exercise. Patient has met program and personal goals.  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  S/P AVR (aortic valve replacement) 03/06/19  ADL [facility]SD:    Initial Exercise Prescription:  Initial Exercise Prescription - 05/26/19 1500       Date of Initial Exercise RX and Referring Provider   Date 05/26/19    Referring Provider [name], MD    Expected Discharge Date 07/22/19      Bike   Level 1.5    Minutes 15    METs 2      NuStep   Level 2    SPM 85    Minutes 15    METs 2      Prescription Details   Frequency (times per week) 3    Duration Progress to 30 minutes of continuous aerobic without signs/symptoms of physical distress      Intensity   THRR 40-80% of Max Heartrate 60-120    Ratings of Perceived Exertion 11-13    Perceived Dyspnea 0-4      Progression   Progression Continue to progress workloads to maintain intensity without signs/symptoms of physical distress.      Resistance Training   Training Prescription Yes    Weight 3lbs    Reps 10-15             Discharge Exercise Prescription (Final Exercise Prescription Changes):  Exercise Prescription Changes - 06/24/19 1400       Response to Exercise   Blood Pressure (Admit) 122/60    Blood Pressure (Exercise) 124/62    Blood Pressure (Exit) 108/70    Heart Rate (Admit) 63 bpm    Heart Rate (Exercise) 70 bpm    Heart Rate (Exit) 61 bpm    Rating of Perceived Exertion (Exercise) 12     Perceived Dyspnea (Exercise) 0    Symptoms None    Comments Reviewed Home Exercise Program    Duration Progress to 10 minutes continuous walking  at current work load and total walking time to 30-45 min    Intensity THRR unchanged      Progression   Progression Continue to progress workloads to maintain intensity without signs/symptoms of physical distress.    Average METs 2.7      Resistance Training   Training Prescription No      NuStep   Level 4    SPM 95    Minutes 30    METs 2.7      Home Exercise Plan   Plans to continue exercise at Home (comment)   Chair Exercises, Stretches   Frequency Add 2 additional days to program exercise sessions.    Initial Home Exercises Provided 06/24/19             Functional Capacity:  6 Minute Walk     Row Name 05/26/19 1426 07/16/19 0739       6 Minute Walk   Phase Initial Discharge    Distance 1006 feet  1089 feet    Distance % Change -- 8.25 %    Distance Feet Change -- 83 ft    Walk Time 6 minutes 6 minutes    # of Rest Breaks 0 0    MPH 1.91 2.1    METS 2.15 2    RPE 11 12    Perceived Dyspnea  1 0    VO2 Peak 7.53 6.9    Symptoms Yes (comment) No    Comments Patient c/o mild shortness of breath because of facial mask. Bilateral knee pain 9/10, used cane during walk    Resting HR 61 bpm 56 bpm    Resting BP 128/72 120/60    Resting Oxygen Saturation  99 % --    Exercise Oxygen Saturation  during 6 min walk 94 % --    Max Ex. HR 97 bpm 81 bpm    Max Ex. BP 148/82 130/72    2 Minute Post BP 138/70 122/70             Psychological, QOL, Others - Outcomes: PHQ 2/9: Depression screen [facility] 2/9 07/20/2019 05/26/2019 08/14/2018 12/18/2016 01/11/2015  Decreased Interest 0 0 3 1 0  Down, Depressed, Hopeless 0 0 1 1 0  PHQ - 2 Score 0 0 4 2 0  Altered sleeping - - 3 - -  Tired, decreased energy - - 3 - -  Change in appetite - - 1 - -  Trouble concentrating - - 1 - -  Moving slowly or fidgety/restless - - 3 - -   Suicidal thoughts - - 0 - -  PHQ-9 Score - - 15 - -  Difficult doing work/chores - - Somewhat difficult - -    Quality of Life:  Quality of Life - 05/26/19 1557       Quality of Life   Select Quality of Life      Quality of Life Scores   Health/Function Pre 20.63 %    Socioeconomic Pre 22.29 %    Psych/Spiritual Pre 21.93 %    Family Pre 24 %    GLOBAL Pre 21.74 %             Personal Goals: Goals established at orientation with interventions provided to work toward goal.  Personal Goals and Risk Factors at Admission - 05/26/19 1558       Core Components/Risk Factors/Patient Goals on Admission    Weight Management Yes;Obesity    Intervention Weight Management/Obesity: Establish reasonable short term and long term weight goals.;Obesity: Provide education and appropriate resources to help participant work on and attain dietary goals.    Admit Weight 211 lb 13.8 oz (96.1 kg)    Expected Outcomes Short Term: Continue to assess and modify interventions until short term weight is achieved;Long Term: Adherence to nutrition and physical activity/exercise program aimed toward attainment of established weight goal;Weight Maintenance: Understanding of the daily nutrition guidelines, which includes 25-35% calories from fat, 7% or less cal from saturated fats, less than 231m cholesterol, less than 1.5gm of sodium, & 5 or more servings of fruits and vegetables daily;Understanding recommendations for meals to include 15-35% energy as protein, 25-35% energy from fat, 35-60% energy from carbohydrates, less than 2030mof dietary cholesterol, 20-35 gm of total fiber daily;Understanding of distribution of calorie intake throughout the day with the consumption of 4-5 meals/snacks    Hypertension Yes    Intervention Provide education on lifestyle modifcations including regular physical activity/exercise, weight management, moderate sodium restriction and increased consumption  of fresh fruit,  vegetables, and low fat dairy, alcohol moderation, and smoking cessation.;Monitor prescription use compliance.    Expected Outcomes Short Term: Continued assessment and intervention until BP is < 140/74m HG in hypertensive participants. < 130/812mHG in hypertensive participants with diabetes, heart failure or chronic kidney disease.;Long Term: Maintenance of blood pressure at goal levels.    Lipids Yes    Intervention Provide education and support for participant on nutrition & aerobic/resistive exercise along with prescribed medications to achieve LDL <7015mHDL >12m32m  Expected Outcomes Short Term: Participant states understanding of desired cholesterol values and is compliant with medications prescribed. Participant is following exercise prescription and nutrition guidelines.;Long Term: Cholesterol controlled with medications as prescribed, with individualized exercise RX and with personalized nutrition plan. Value goals: LDL < 70mg82mL > 40 mg.              Personal Goals Discharge:  Goals and Risk Factor Review     Row Name 06/03/19 1215 06/23/19 1601 07/20/19 1315         Core Components/Risk Factors/Patient Goals Review   Personal Goals Review Weight Management/Obesity;Lipids;Hypertension Weight Management/Obesity;Lipids;Hypertension Weight Management/Obesity;Lipids;Hypertension     Review Gregory [name] started exercise on 06/03/19 without difficulty Gregory [name] iFestus [name] well with exercise twice a week. Gregory [name]'s vital signs have been stable. Gregory [name] iFestus [name] well with exercise twice a week. Gregory [name]'s vital signs have been stable. Gregory [name] completes phase 2 cardiac rehab on 07/22/19     Expected Outcomes Gregory [name] continue to participate in for exercise, nutrition and lifestyle modifications Gregory [name] continue to participate in for exercise, nutrition and lifestyle modifications Gregory [name] continue to walk at home use his hand weights and continue lifestyle and dietary modifications upon completion of phase 2 cardiac rehab.               Exercise Goals and Review:  Exercise Goals     Row Name 05/26/19 1418             Exercise Goals   Increase Physical Activity Yes       Intervention Provide advice, education, support and counseling about physical activity/exercise needs.;Develop an individualized exercise prescription for aerobic and resistive training based on initial evaluation findings, risk stratification, comorbidities and participant's personal goals.       Expected Outcomes Short Term: Attend rehab on a regular basis to increase amount of physical activity.;Long Term: Add in home exercise to make exercise part of routine and to increase amount of physical activity.;Long Term: Exercising regularly at least 3-5 days a week.       Increase Strength and Stamina Yes       Intervention Provide advice, education, support and counseling about physical activity/exercise needs.;Develop an individualized exercise prescription for aerobic and resistive training based on initial evaluation findings, risk stratification, comorbidities and participant's personal goals.       Expected Outcomes Short Term: Increase workloads from initial exercise prescription for resistance, speed, and METs.;Short Term: Perform resistance training exercises routinely during rehab and add in resistance training at home;Long Term: Improve cardiorespiratory fitness, muscular endurance and strength as measured by increased METs and functional capacity (6MWT)       Able to understand and use rate of perceived exertion (RPE) scale Yes       Intervention Provide education and explanation on how to use RPE scale       Expected Outcomes Short Term: Able to use RPE daily in rehab to express subjective intensity level;Long Term:  Able to use RPE to guide intensity level when exercising independently       Knowledge and understanding of Target Heart Rate Range (THRR) Yes       Intervention Provide education and explanation of THRR including how  the numbers were predicted and where they are located for reference       Expected Outcomes Short Term: Able to state/look up THRR;Long Term: Able to use THRR to govern intensity when exercising independently;Short Term: Able to use daily as guideline for intensity in rehab       Able to check pulse independently Yes       Intervention Provide education and demonstration on how to check pulse in carotid and radial arteries.;Review the importance of being able to check your own pulse for safety during independent exercise       Expected Outcomes Short Term: Able to explain why pulse checking is important during independent exercise;Long Term: Able to check pulse independently and accurately       Understanding of Exercise Prescription Yes       Intervention Provide education, explanation, and written materials on patient's individual exercise prescription       Expected Outcomes Short Term: Able to explain program exercise prescription;Long Term: Able to explain home exercise prescription to exercise independently                Exercise Goals Re-Evaluation:  Exercise Goals Re-Evaluation     Row Name 06/24/19 1502             Exercise Goal Re-Evaluation   Exercise Goals Review Increase Physical Activity;Increase Strength and Stamina;Able to understand and use rate of perceived exertion (RPE) scale;Knowledge and understanding of Target Heart Rate Range (THRR);Able to check pulse independently;Understanding of Exercise Prescription       Comments Reviewed HEP with pt. Also discussed THRR, RPE Scale, weather precuations, NTG use, endpoints of exercise, warmup and cool down and strength training.       Expected Outcomes Pt will continue to do stretches at home. Pt is limited with exercise choices due to orthopedic limitations. Pt is unable to walk for 10 minutes at a time. Pt is considering joining local [employer] with [employer] to have access to water aerobics. Encouraged pt to use that  resource to help with home exercise. Will continue to monitor.                Nutrition & Weight - Outcomes:  Pre Biometrics - 05/26/19 1423       Pre Biometrics   Height _0  (1.702 m)    Weight 96.1 kg    Waist Circumference 43 inches    Hip Circumference 44.25 inches    Waist to Hip Ratio 0.97 %    BMI (Calculated) 33.17    Triceps Skinfold 16 mm    % Body Fat 31.5 %    Grip Strength 25.5 kg    Flexibility 14 in    Single Leg Stand 0.81 seconds             Post Biometrics - 07/16/19 0743        Post  Biometrics   Height _1  (1.702 m)    Weight 98.6 kg    Waist Circumference 44 inches    Hip Circumference 45 inches    Waist to Hip Ratio 0.98 %    BMI (Calculated) 34.04    Triceps Skinfold 15 mm    % Body Fat 32 %  Grip Strength 28 kg    Flexibility 17.5 in    Single Leg Stand 0 seconds             Nutrition:  Nutrition Therapy & Goals - 06/03/19 1438       Nutrition Therapy   Diet Heart Healthy      Personal Nutrition Goals   Nutrition Goal Pt to identify food quantities necessary to achieve weight loss of 6-24 lb at graduation from cardiac rehab.    Personal Goal #2 Pt to build a healthy plate including vegetables, fruits, whole grains, and low-fat dairy products in a heart healthy meal plan.      Intervention Plan   Intervention Nutrition handout(s) given to patient.;Prescribe, educate and counsel regarding individualized specific dietary modifications aiming towards targeted core components such as weight, hypertension, lipid management, diabetes, heart failure and other comorbidities.    Expected Outcomes Short Term Goal: A plan has been developed with personal nutrition goals set during dietitian appointment.;Long Term Goal: Adherence to prescribed nutrition plan.             Nutrition Discharge:  Nutrition Assessments - 06/03/19 1442       MEDFICTS Scores   Pre Score 46             Education Questionnaire Score:   Knowledge Questionnaire Score - 05/26/19 1525       Knowledge Questionnaire Score   Pre Score 19/24             Goals reviewed with patient; copy given to patient.Gregory [name] graduated from cardiac rehab program on 07/22/19 with completion of 14 exercise sessions in Phase II. Pt maintained good attendance and progressed nicely during his participation in rehab as evidenced by increased MET level.   Medication list reconciled. Repeat  PHQ score-0  .  Pt has made significant lifestyle changes and should be commended for his success. Pt feels he has achieved his goals during cardiac rehab. Gregory [name] increased his distance on his post exercise walk test by 83 feet  Pt plans to continue exercise by walking when able and using hand weights.Barnet Pall, RN,BSN 07/21/2019 1:14 PM

## 2019-07-22 ENCOUNTER — Encounter (HOSPITAL_COMMUNITY)
Admission: RE | Admit: 2019-07-22 | Discharge: 2019-07-22 | Disposition: A | Discharge status: Home / Self Care | Payer: Medicare HMO | Source: Ambulatory Visit | Attending: Cardiology | Admitting: Cardiology

## 2019-07-22 ENCOUNTER — Other Ambulatory Visit: Payer: Self-pay

## 2019-07-22 DIAGNOSIS — Z951 Presence of aortocoronary bypass graft: Secondary | ICD-10-CM | POA: Diagnosis not present

## 2019-07-22 DIAGNOSIS — Z952 Presence of prosthetic heart valve: Secondary | ICD-10-CM | POA: Diagnosis not present

## 2019-07-23 ENCOUNTER — Other Ambulatory Visit: Payer: Self-pay

## 2019-07-23 ENCOUNTER — Ambulatory Visit (INDEPENDENT_AMBULATORY_CARE_PROVIDER_SITE_OTHER): Payer: Medicare HMO

## 2019-07-23 DIAGNOSIS — Z7901 Long term (current) use of anticoagulants: Secondary | ICD-10-CM

## 2019-07-23 LAB — POCT INR: INR: 1.4 — AB (ref 2.0–3.0)

## 2019-07-23 MED ORDER — WARFARIN SODIUM 2.5 MG PO TABS: 2.5000 mg | ORAL_TABLET | Freq: Every day | ORAL | 3 refills | Status: DC

## 2019-07-23 NOTE — Patient Instructions (Addendum)
Pre visit review using our clinic review tool, if applicable. No additional management support is needed unless otherwise documented below in the visit note.  Increase dose today to 2.5 mg and increase dose tomorrow to 3.75mg  then change weekly does to take 2.5 daily.  Recheck in 2 wks.

## 2019-07-23 NOTE — Progress Notes (Signed)
Pt requested refill while in clinic today. Refill sent in.

## 2019-07-27 ENCOUNTER — Ambulatory Visit: Payer: Medicare HMO | Admitting: Podiatry

## 2019-07-27 ENCOUNTER — Other Ambulatory Visit: Payer: Self-pay | Admitting: Family Medicine

## 2019-07-30 ENCOUNTER — Ambulatory Visit: Payer: Medicare HMO | Admitting: Podiatry

## 2019-07-30 ENCOUNTER — Encounter: Payer: Self-pay | Admitting: Podiatry

## 2019-07-30 ENCOUNTER — Other Ambulatory Visit: Payer: Self-pay

## 2019-07-30 DIAGNOSIS — M79675 Pain in left toe(s): Secondary | ICD-10-CM | POA: Diagnosis not present

## 2019-07-30 DIAGNOSIS — I890 Lymphedema, not elsewhere classified: Secondary | ICD-10-CM | POA: Diagnosis not present

## 2019-07-30 DIAGNOSIS — B351 Tinea unguium: Secondary | ICD-10-CM

## 2019-07-30 DIAGNOSIS — M79674 Pain in right toe(s): Secondary | ICD-10-CM

## 2019-07-30 DIAGNOSIS — D689 Coagulation defect, unspecified: Secondary | ICD-10-CM

## 2019-07-30 NOTE — Progress Notes (Addendum)
This patient returns to my office for at risk foot care.  This patient requires this care by a professional since this patient will be at risk due to having lymphedema, and coagulation defect.  Patient is taking coumadin.  This patient is unable to cut nails himself since the patient cannot reach his nails.These nails are painful walking and wearing shoes.  This patient presents for at risk foot care today.  General Appearance  Alert, conversant and in no acute stress.  Vascular  Dorsalis pedis and posterior tibial  pulses are weakly  palpable due to swelling both feet. bilaterally.  Capillary return is within normal limits  bilaterally. Temperature is within normal limits  bilaterally.  Neurologic  Senn-Weinstein monofilament wire test within normal limits  bilaterally. Muscle power within normal limits bilaterally.  Nails Thick disfigured discolored nails with subungual debris  Hallux nails  B/L.  Pincer nails hallux  B/L. No evidence of bacterial infection or drainage bilaterally.  Orthopedic  No limitations of motion  feet .  No crepitus or effusions noted.  No bony pathology or digital deformities noted.  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  Skin  normotropic skin with no porokeratosis noted bilaterally.  No signs of infections or ulcers noted.     Onychomycosis  Pain in right toes  Pain in left toes  Consent was obtained for treatment procedures.   Mechanical debridement of nails 1-5  bilaterally performed with a nail nipper.  Filed with dremel without incident.    Return office visit    3 months                  Told patient to return for periodic foot care and evaluation due to potential at risk complications.   Gardiner Barefoot DPM

## 2019-08-04 ENCOUNTER — Ambulatory Visit (INDEPENDENT_AMBULATORY_CARE_PROVIDER_SITE_OTHER): Payer: Medicare HMO

## 2019-08-04 ENCOUNTER — Other Ambulatory Visit: Payer: Self-pay

## 2019-08-04 DIAGNOSIS — Z7901 Long term (current) use of anticoagulants: Secondary | ICD-10-CM | POA: Diagnosis not present

## 2019-08-04 LAB — POCT INR: INR: 1.6 — AB (ref 2.0–3.0)

## 2019-08-04 NOTE — Patient Instructions (Addendum)
Pre visit review using our clinic review tool, if applicable. No additional management support is needed unless otherwise documented below in the visit note.  Increase dose today to 5 mg and then change weekly does to take 2.5 daily except take 5mg  on Mondays and 3.75mg  on Thursdays.  Recheck in 2 wks.

## 2019-08-05 NOTE — Progress Notes (Signed)
Cardiology Office Note   Date:  08/06/2019   ID:  Alphonsa, Brickle 1950/02/14, MRN [id number]  PCP:  Jinny Sanders, MD  Cardiologist:   [name], MD   Chief Complaint  Patient presents with  . Coronary Artery Disease      History of Present Illness: Gregory [name] is a 69 y.o. male for follow up of CAD/CABG and AVR.   He had a cardiac catheterization during recent hospitalization on 03/04/2019 which revealed severe calcific disease in the mid LAD, the circumflex had mild to moderate proximal stenosis, the RCA, a large dominant vessel, had severe proximal stenosis and severe mid stenosis with poor flow into the distal vessel.  He was also found to have severe aortic regurgitation and moderate aortic valve stenosis, with a mean gradient across aortic valve 33mmHg with AVA, but judged to be severe aortic insufficiency in the setting of dilated aortic root with high flow from AI leading to elevated gradient across aortic valve.  He underwent coronary artery bypass graft with aortic valve replacement on 03/06/2019.  The patient had LIMA to LAD, left radial artery to PDA, predicted RIMA to the right posterior lateral artery, with open left radial artery harvest, bilateral IMA harvesting.  Also, 25 mm [name] Intuity Bovine bioprosthetic aortic valve placement.  Of note, the patient had a severe reaction to heparin with HIT and severe profound thrombocytopenia postoperatively. He had postoperative atrial fibrillation and therefore was started on amiodarone 200 mg daily.  He had a left arm hematoma and an elevated INR.  He was followed by heme.   He was started back on warfarin as his platelets increased and he now no longer needs to be seen by Heme per their report.  After the last visit I followed up with an echo.  He had a small perivalvular leak.  Otherwise it was stable.    He has been doing relatively well.  He is back at work.  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 The patient denies any new symptoms such as chest discomfort, neck or arm discomfort. There has been no new shortness of breath, PND or orthopnea. There have been no reported palpitations, presyncope or syncope.     Past Medical History:  Diagnosis Date  . Alcohol abuse   . Aortic valve disease    AI and AS, Moderate  . Chest pain 03/2019  . Coronary artery disease   . Drug use   . Edema of both lower extremities   . Glaucoma   . Hypertension   . OSA (obstructive sleep apnea)   . Shingles     Past Surgical History:  Procedure Laterality Date  . AORTIC VALVE REPLACEMENT N/A 03/06/2019   Procedure: AORTIC VALVE REPLACEMENT (AVR), USING INTUITY 25MM;  Surgeon: Wonda Olds, MD;  Location: [facility];  Service: Open Heart Surgery;  Laterality: N/A;  . CARDIAC CATHETERIZATION    . CATARACT EXTRACTION    . CORONARY ARTERY BYPASS GRAFT N/A 03/06/2019   Procedure: CORONARY ARTERY BYPASS GRAFTING (CABG), ON PUMP, TIMES THREE, USING BILATERAL INTERNAL MAMMARIES AND LEFT RADIAL ARTERY HARVEST;  Surgeon: Wonda Olds, MD;  Location: [facility];  Service: Open Heart Surgery;  Laterality: N/A;  BILATERAL IMA  . LEFT HEART CATH AND CORONARY ANGIOGRAPHY N/A 03/04/2019   Procedure: LEFT HEART CATH AND CORONARY ANGIOGRAPHY;  Surgeon: Burnell Blanks, MD;  Location: [facility];  Service: Cardiovascular;  Laterality: N/A;  .  None    . RADIAL ARTERY HARVEST Left 03/06/2019   Procedure: RADIAL ARTERY HARVEST;  Surgeon: Wonda Olds, MD;  Location: [facility];  Service: Open Heart Surgery;  Laterality: Left;  . TEE WITHOUT CARDIOVERSION N/A 03/06/2019   Procedure: TRANSESOPHAGEAL ECHOCARDIOGRAM (TEE);  Surgeon: Wonda Olds, MD;  Location: [facility];  Service: Open Heart Surgery;  Laterality: N/A;     Current Outpatient Medications  Medication Sig Dispense Refill  . ALPRAZolam (XANAX) 0.5 MG tablet TAKE 1 TABLET BY MOUTH ONCE DAILY AS NEEDED FOR ANXIETY (TAKE  30  MINUTES  PRIOR   TO  PLANE  FLIGHT  OR  AS  NEEDED  FOR  ANXIETY) 20 tablet 0  . atorvastatin (LIPITOR) 80 MG tablet Take 1 tablet (80 mg total) by mouth daily. 30 tablet 3  . colchicine 0.6 MG tablet Take 2 tablets on first day of gout flare then 1 tablet daily until gout flare has resolved    . cyclobenzaprine (FLEXERIL) 10 MG tablet Take 1 tablet (10 mg total) by mouth at bedtime as needed for muscle spasms. 15 tablet 0  . ketoconazole (NIZORAL) 2 % shampoo Apply 1 application topically 2 (two) times a week. 120 mL 0  . latanoprost (XALATAN) 0.005 % ophthalmic solution Place 1 drop into both eyes every morning.     . metoprolol tartrate (LOPRESSOR) 25 MG tablet Take 0.5 tablets (12.5 mg total) by mouth 2 (two) times daily. 30 tablet 3  . Misc Natural Products (OSTEO BI-FLEX/5-LOXIN ADVANCED PO) Take 2 tablets by mouth daily.     . sertraline (ZOLOFT) 50 MG tablet Take 1 tablet by mouth once daily 30 tablet 2  . traMADol (ULTRAM) 50 MG tablet Take 1-2 tablets (50-100 mg total) by mouth every 4 (four) hours as needed for moderate pain. 30 tablet 0  . traZODone (DESYREL) 50 MG tablet Take 0.5-1 tablets (25-50 mg total) by mouth at bedtime as needed for sleep. 30 tablet 3  . aspirin EC 81 MG tablet Take 1 tablet (81 mg total) by mouth daily. Swallow whole. 30 tablet 11   No current facility-administered medications for this visit.    Allergies:   Heparin    ROS:  Please see the history of present illness.   Otherwise, review of systems are positive for none.   All other systems are reviewed and negative.    PHYSICAL EXAM: VS:  BP 130/60   Pulse (!) 56   Temp 97.9 F (36.6 C)   Ht 5\' 7"  (1.702 m)   Wt 214 lb 6.4 oz (97.3 kg)   SpO2 95%   BMI 33.58 kg/m  , BMI Body mass index is 33.58 kg/m.  GENERAL:  Well appearing NECK:  No jugular venous distention, waveform within normal limits, carotid upstroke brisk and symmetric, no bruits, no thyromegaly LUNGS:  Clear to auscultation bilaterally CHEST:  Well-healed sternotomy scar HEART:  PMI not displaced or sustained,S1 and S2 within normal limits, no S3, no S4, no clicks, no rubs, 2 out of 6 apical systolic murmur radiating slightly at the aortic outflow tract, no diastolic murmurs ABD:  Flat, positive bowel sounds normal in frequency in pitch, no bruits, no rebound, no guarding, no midline pulsatile mass, no hepatomegaly, no splenomegaly EXT:  2 plus pulses throughout, mild ankle edema, no cyanosis no clubbing   EKG:  EKG not ordered today.    Recent Labs: 08/14/2018: TSH 2.560 03/07/2019: Magnesium 2.3 06/10/2019: BUN 15; Creatinine, Ser 0.84; Hemoglobin 10.3; Platelets  202.0; Potassium 4.2; Sodium 136    Lipid Panel    Component Value Date/Time   CHOL 161 08/04/2018 1052   TRIG 105 08/04/2018 1052   TRIG 162 (H) 10/25/2005 1507   HDL 66 08/04/2018 1052   CHOLHDL 2.4 08/04/2018 1052   CHOLHDL 3 01/14/2018 0752   VLDL 25.8 01/14/2018 0752   LDLCALC 74 08/04/2018 1052   LDLDIRECT 151.0 01/05/2016 0748      Wt Readings from Last 3 Encounters:  08/06/19 214 lb 6.4 oz (97.3 kg)  07/16/19 217 lb 6 oz (98.6 kg)  06/25/19 210 lb 15.7 oz (95.7 kg)      Other studies Reviewed: Additional studies/ records that were reviewed today include:   Echo Review of the above records demonstrates:  Please see elsewhere in the note.     ASSESSMENT AND PLAN:  CAD: S/P CABG.   The patient has no new sypmtoms.  No further cardiovascular testing is indicated.  We will continue with aggressive risk reduction and meds as listed.  HIT:    Today he can come off his anticoagulation.  He will start back on his 81 mg aspirin.  He is advised to get some indication that he can wear that he has a heparin allergy.  PAF:  He has had no recurrence of this.  Med changes as above.  S/P Bioprosthetic AoV replacement:   I will repeat an echo in December.  He understands endocarditis prophylaxis.   Hyperlipidemia:    I will have him check a fasting lipid  profile.   Hematoma:   This resolved.  Covid education: He has had his vaccine.   Current medicines are reviewed at length with the patient today.  The patient does not have concerns regarding medicines.  The following changes have been made:  no change  Labs/ tests ordered today include:   Orders Placed This Encounter  Procedures  . Lipid panel  . ECHOCARDIOGRAM COMPLETE     Disposition:   FU with me in December after the echo .    Signed, [name], MD  08/06/2019 11:56 AM    [place]

## 2019-08-06 ENCOUNTER — Encounter: Payer: Self-pay | Admitting: Cardiology

## 2019-08-06 ENCOUNTER — Other Ambulatory Visit: Payer: Self-pay

## 2019-08-06 ENCOUNTER — Ambulatory Visit: Payer: Medicare HMO | Admitting: Cardiology

## 2019-08-06 VITALS — BP 130/60 | HR 56 | Temp 97.9°F | Ht 67.0 in | Wt 214.4 lb

## 2019-08-06 DIAGNOSIS — Z952 Presence of prosthetic heart valve: Secondary | ICD-10-CM

## 2019-08-06 DIAGNOSIS — I480 Paroxysmal atrial fibrillation: Secondary | ICD-10-CM

## 2019-08-06 DIAGNOSIS — D7582 Heparin induced thrombocytopenia (HIT): Secondary | ICD-10-CM

## 2019-08-06 DIAGNOSIS — E785 Hyperlipidemia, unspecified: Secondary | ICD-10-CM

## 2019-08-06 DIAGNOSIS — I2510 Atherosclerotic heart disease of native coronary artery without angina pectoris: Secondary | ICD-10-CM

## 2019-08-06 DIAGNOSIS — D75829 Heparin-induced thrombocytopenia, unspecified: Secondary | ICD-10-CM

## 2019-08-06 MED ORDER — ASPIRIN EC 81 MG PO TBEC: 81.0000 mg | DELAYED_RELEASE_TABLET | Freq: Every day | ORAL | 11 refills | Status: DC

## 2019-08-06 NOTE — Patient Instructions (Signed)
Medication Instructions:  Stop Warfarin Start Aspirin 81 mg daily Continue all other medications *If you need a refill on your cardiac medications before your next appointment, please call your pharmacy*   Lab Work: Fasting Lipid Panel Lab order enclosed   Testing/Procedures: Schedule Echo in 4 months   Follow-Up: At [facility], you and your health needs are our priority.  As part of our continuing mission to provide you with exceptional heart care, we have created designated Provider Care Teams.  These Care Teams include your primary Cardiologist (physician) and Advanced Practice Providers (APPs -  Physician Assistants and Nurse Practitioners) who all work together to provide you with the care you need, when you need it.  We recommend signing up for the patient portal called "MyChart".  Sign up information is provided on this After Visit Summary.  MyChart is used to connect with patients for Virtual Visits (Telemedicine).  Patients are able to view lab/test results, encounter notes, upcoming appointments, etc.  Non-urgent messages can be sent to your provider as well.   To learn more about what you can do with MyChart, go to [email or web address].    Your next appointment: 4 months    The format for your next appointment: Office     Provider:  Dr.Hochrein

## 2019-08-07 DIAGNOSIS — I480 Paroxysmal atrial fibrillation: Secondary | ICD-10-CM | POA: Diagnosis not present

## 2019-08-07 DIAGNOSIS — I2510 Atherosclerotic heart disease of native coronary artery without angina pectoris: Secondary | ICD-10-CM | POA: Diagnosis not present

## 2019-08-07 DIAGNOSIS — Z952 Presence of prosthetic heart valve: Secondary | ICD-10-CM | POA: Diagnosis not present

## 2019-08-07 DIAGNOSIS — D7582 Heparin induced thrombocytopenia (HIT): Secondary | ICD-10-CM | POA: Diagnosis not present

## 2019-08-07 DIAGNOSIS — E785 Hyperlipidemia, unspecified: Secondary | ICD-10-CM | POA: Diagnosis not present

## 2019-08-07 LAB — LIPID PANEL
Chol/HDL Ratio: 2.2 ratio (ref 0.0–5.0)
Cholesterol, Total: 132 mg/dL (ref 100–199)
HDL: 61 mg/dL (ref >39)
LDL Chol Calc (NIH): 52 mg/dL (ref 0–99)
Triglycerides: 103 mg/dL (ref 0–149)
VLDL Cholesterol Cal: 19 mg/dL (ref 5–40)

## 2019-08-11 ENCOUNTER — Telehealth: Payer: Self-pay | Admitting: Family Medicine

## 2019-08-11 ENCOUNTER — Ambulatory Visit: Payer: Self-pay

## 2019-08-11 NOTE — Telephone Encounter (Signed)
FYI Spouse called to cancel coumadin appointment on 5/17.  She stated cardiology took pt of meds

## 2019-08-11 NOTE — Telephone Encounter (Signed)
Noted. Coumadin episode completed.

## 2019-08-12 ENCOUNTER — Encounter: Payer: Self-pay | Admitting: Family Medicine

## 2019-08-12 ENCOUNTER — Other Ambulatory Visit: Payer: Self-pay

## 2019-08-12 ENCOUNTER — Ambulatory Visit (INDEPENDENT_AMBULATORY_CARE_PROVIDER_SITE_OTHER): Payer: Medicare HMO | Admitting: Family Medicine

## 2019-08-12 VITALS — BP 118/70 | HR 60 | Temp 98.5°F | Ht 67.0 in | Wt 213.0 lb

## 2019-08-12 DIAGNOSIS — M109 Gout, unspecified: Secondary | ICD-10-CM | POA: Diagnosis not present

## 2019-08-12 DIAGNOSIS — M1712 Unilateral primary osteoarthritis, left knee: Secondary | ICD-10-CM

## 2019-08-13 ENCOUNTER — Encounter: Payer: Self-pay | Admitting: Family Medicine

## 2019-08-13 MED ORDER — COLCHICINE 0.6 MG PO TABS: 0.6000 mg | ORAL_TABLET | Freq: Two times a day (BID) | ORAL | 2 refills | Status: DC

## 2019-08-13 MED ORDER — TRAMADOL HCL 50 MG PO TABS: 50.0000 mg | ORAL_TABLET | ORAL | 0 refills | Status: DC | PRN

## 2019-08-13 MED ORDER — PREDNISONE 20 MG PO TABS: ORAL_TABLET | ORAL | 0 refills | Status: DC

## 2019-08-13 NOTE — Progress Notes (Signed)
Jeffre Enriques T. Todrick Siedschlag, MD, [name] [place], [address]  Phone: [phone]  FAX: [phone]  [name] - 69 y.o. male  MRN [id number]  Date of Birth: [birth year]  Date: 08/12/2019  PCP: Jinny Sanders, MD  Referral: Jinny Sanders, MD  Chief Complaint  Patient presents with  . Left Knee Pain    Has been bothering him for awhile. 08-06-19 started feeling a "catch" then heard a pop when he straightened it out all the way. H/O Gout. Has been taking colchicine. That seems to have helped with the pain.    This visit occurred during the SARS-CoV-2 public health emergency.  Safety protocols were in place, including screening questions prior to the visit, additional usage of staff PPE, and extensive cleaning of exam room while observing appropriate contact time as indicated for disinfecting solutions.   Subjective:   Gregory [name] is a 69 y.o. very pleasant male patient with Body mass index is 33.36 kg/m. who presents with the following:  He is a very nice gentleman, and he reports with a history of gouty arthropathy several times in the last 6 months.  He has had a swollen knee that was red and warm a few days ago.  He did start taking some colchicine 1 tablet twice daily on day 1, and subsequently 1 tablet daily.  He and his wife both think that his knee has improved and he is feeling better, but he is not back to baseline.  He does walk with a cane at baseline.  In the past, he reports trying QC kinetics without any relief of symptoms.  PRP versus stem cell.  Review of Systems is noted in the HPI, as appropriate   Objective:   BP 118/70 (BP Location: Right Arm, Patient Position: Sitting, Cuff Size: Large)   Pulse 60   Temp 98.5 F (36.9 C)   Ht 5\' 7"  (1.702 m)   Wt 213 lb (96.6 kg)   SpO2 97%   BMI 33.36 kg/m    GEN: No acute distress;  alert,appropriate. PULM: Breathing comfortably in no respiratory distress PSYCH: Normally interactive.    Left knee: The patient lacks 8 degrees of extension and he is able to flex to 95 degrees.  He does have a moderate ballotable effusion.  Stable to varus and valgus stress.  Lachman and posterior drawer testing is negative.  No tenderness at the tibial plateau as well as no tenderness at the patella.  Patellar and quad tendons are nontender to palpation.  He does have some tenderness on the medial joint line.  Currently there is no warmth or redness.  Radiology: No results found.  Assessment and Plan:     ICD-10-CM   1. Acute gout of left knee, unspecified cause  M10.9   2. Primary osteoarthritis of left knee  M17.12    During this office visit epic medical record was entirely down, and I was not able to review his chart, labs, or prior films.  This is a limitation and he and his wife understand this.  He clinically improved taking colchicine alone, and this is certainly gouty arthropathy.  Increase colchicine dosing to twice daily.  Pulse of oral steroids.  Also did renew his tramadol.  I did go over gouty arthropathy as well as osteoarthritis with the patient and his wife.  They had some questions about joint replacement, and  I reviewed this, but at this time unable to assess the patient in terms of his degree of osteoarthritis.    Longstanding problem contributes with acute exacerbation in terms of both gout as well as osteoarthritis.  Follow-up: No follow-ups on file.  Meds ordered this encounter  Medications  . traMADol (ULTRAM) 50 MG tablet    Sig: Take 1-2 tablets (50-100 mg total) by mouth every 4 (four) hours as needed for moderate pain.    Dispense:  30 tablet    Refill:  0  . colchicine 0.6 MG tablet    Sig: Take 1 tablet (0.6 mg total) by mouth 2 (two) times daily. Prn gout    Dispense:  60 tablet    Refill:  2  . predniSONE (DELTASONE) 20 MG tablet    Sig: 2  tabs po daily for 4 days, then 1 tab po daily for 4 days    Dispense:  12 tablet    Refill:  0   Medications Discontinued During This Encounter  Medication Reason  . traMADol (ULTRAM) 50 MG tablet Reorder  . colchicine 0.6 MG tablet Reorder   No orders of the defined types were placed in this encounter.   Signed,  Maud Deed. Ommie Degeorge, MD   Outpatient Encounter Medications as of 08/12/2019  Medication Sig  . ALPRAZolam (XANAX) 0.5 MG tablet TAKE 1 TABLET BY MOUTH ONCE DAILY AS NEEDED FOR ANXIETY (TAKE  30  MINUTES  PRIOR  TO  PLANE  FLIGHT  OR  AS  NEEDED  FOR  ANXIETY)  . aspirin EC 81 MG tablet Take 1 tablet (81 mg total) by mouth daily. Swallow whole.  [name] atorvastatin (LIPITOR) 80 MG tablet Take 1 tablet (80 mg total) by mouth daily.  . colchicine 0.6 MG tablet Take 1 tablet (0.6 mg total) by mouth 2 (two) times daily. Prn gout  . cyclobenzaprine (FLEXERIL) 10 MG tablet Take 1 tablet (10 mg total) by mouth at bedtime as needed for muscle spasms.  [name] ketoconazole (NIZORAL) 2 % shampoo Apply 1 application topically 2 (two) times a week.  . latanoprost (XALATAN) 0.005 % ophthalmic solution Place 1 drop into both eyes every morning.   . metoprolol tartrate (LOPRESSOR) 25 MG tablet Take 0.5 tablets (12.5 mg total) by mouth 2 (two) times daily.  . Misc Natural Products (OSTEO BI-FLEX/5-LOXIN ADVANCED PO) Take 2 tablets by mouth daily.   . sertraline (ZOLOFT) 50 MG tablet Take 1 tablet by mouth once daily  . traMADol (ULTRAM) 50 MG tablet Take 1-2 tablets (50-100 mg total) by mouth every 4 (four) hours as needed for moderate pain.  . traZODone (DESYREL) 50 MG tablet Take 0.5-1 tablets (25-50 mg total) by mouth at bedtime as needed for sleep.  . [DISCONTINUED] colchicine 0.6 MG tablet Take 2 tablets on first day of gout flare then 1 tablet daily until gout flare has resolved  . predniSONE (DELTASONE) 20 MG tablet 2 tabs po daily for 4 days, then 1 tab po daily for 4 days  . [DISCONTINUED]  traMADol (ULTRAM) 50 MG tablet Take 1-2 tablets (50-100 mg total) by mouth every 4 (four) hours as needed for moderate pain.   No facility-administered encounter medications on file as of 08/12/2019.

## 2019-08-18 ENCOUNTER — Ambulatory Visit: Payer: Medicare HMO

## 2019-08-27 ENCOUNTER — Telehealth: Payer: Self-pay

## 2019-08-27 NOTE — Telephone Encounter (Signed)
Pt has been coughing x 2 days... nonproductive...denies SOB, difficulty breathing, wheezing, fever, chills, nausea, vomiting, body aches, loss of taste or smell---has been had covid vaccine... Denies sick contacts... Wife would like to know what medication pt can take due to his recent heart surgery... please advise

## 2019-08-28 NOTE — Telephone Encounter (Signed)
Mucinex plain or DM.

## 2019-08-28 NOTE — Telephone Encounter (Signed)
Gregory [name] notified as instructed by telephone.

## 2019-08-29 DIAGNOSIS — Z20828 Contact with and (suspected) exposure to other viral communicable diseases: Secondary | ICD-10-CM | POA: Diagnosis not present

## 2019-09-09 ENCOUNTER — Other Ambulatory Visit: Payer: Self-pay | Admitting: Family Medicine

## 2019-09-09 NOTE — Telephone Encounter (Signed)
Last office visit 08/12/2019 with Dr. Lorelei Pont for acute gout of left knee.  Last refilled 08/12/2021f or #30 with no refills by Dr. Lorelei Pont.  No future appointments with PCP.

## 2019-09-15 ENCOUNTER — Other Ambulatory Visit: Payer: Self-pay | Admitting: Physician Assistant

## 2019-10-06 DIAGNOSIS — H401133 Primary open-angle glaucoma, bilateral, severe stage: Secondary | ICD-10-CM | POA: Diagnosis not present

## 2019-10-12 ENCOUNTER — Telehealth: Payer: Self-pay | Admitting: Family Medicine

## 2019-10-12 NOTE — Telephone Encounter (Signed)
Please try and schedule MWV with nurse and CPE with Dr. Bedsole. 

## 2019-10-18 ENCOUNTER — Other Ambulatory Visit: Payer: Self-pay | Admitting: Physician Assistant

## 2019-10-25 ENCOUNTER — Other Ambulatory Visit: Payer: Self-pay | Admitting: Family Medicine

## 2019-10-26 NOTE — Telephone Encounter (Signed)
Last office visit 08/12/2019 with Dr. Lorelei Pont for Gout of left knee.  Last refilled Alprazolam 03/23/2019 for #20 with no refills.  Sertraline 07/27/2019 for #30 with 2 refills.   Per Video visit on 05/01/2019 with Dr. Diona Browner patient was to follow up 6 months or at next wellness.   No future appointments with PCP.

## 2019-10-28 NOTE — Telephone Encounter (Signed)
Labs and medicare wellness 12/17 cpx 12/21 with bedsole  Pt aware

## 2019-11-02 ENCOUNTER — Ambulatory Visit: Payer: Medicare HMO | Admitting: Podiatry

## 2019-11-02 ENCOUNTER — Encounter: Payer: Self-pay | Admitting: Podiatry

## 2019-11-02 ENCOUNTER — Other Ambulatory Visit: Payer: Self-pay

## 2019-11-02 DIAGNOSIS — B351 Tinea unguium: Secondary | ICD-10-CM

## 2019-11-02 DIAGNOSIS — I890 Lymphedema, not elsewhere classified: Secondary | ICD-10-CM | POA: Diagnosis not present

## 2019-11-02 DIAGNOSIS — M79674 Pain in right toe(s): Secondary | ICD-10-CM | POA: Diagnosis not present

## 2019-11-02 DIAGNOSIS — M79675 Pain in left toe(s): Secondary | ICD-10-CM | POA: Diagnosis not present

## 2019-11-02 DIAGNOSIS — D689 Coagulation defect, unspecified: Secondary | ICD-10-CM | POA: Diagnosis not present

## 2019-11-02 NOTE — Progress Notes (Addendum)
This patient returns to my office for at risk foot care.  This patient requires this care by a professional since this patient will be at risk due to having lymphedema, and coagulation defect.    This patient is unable to cut nails himself since the patient cannot reach his nails.These nails are painful walking and wearing shoes.  This patient presents for at risk foot care today.  General Appearance  Alert, conversant and in no acute stress.  Vascular  Dorsalis pedis and posterior tibial  pulses are weakly  palpable due to swelling both feet. bilaterally.  Capillary return is within normal limits  bilaterally. Temperature is within normal limits  Bilaterally.  Mild leg swelling. Neurologic  Senn-Weinstein monofilament wire test within normal limits  bilaterally. Muscle power within normal limits bilaterally.  Nails Thick disfigured discolored nails with subungual debris  Hallux nails  B/L.  Pincer nails hallux  B/L. No evidence of bacterial infection or drainage bilaterally.  Orthopedic  No limitations of motion  feet .  No crepitus or effusions noted.  No bony pathology or digital deformities noted.  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  Skin  normotropic skin with no porokeratosis noted bilaterally.  No signs of infections or ulcers noted.     Onychomycosis  Pain in right toes  Pain in left toes  Consent was obtained for treatment procedures.   Mechanical debridement of nails 1-5  bilaterally performed with a nail nipper.  Filed with dremel without incident.    Return office visit    3 months                  Told patient to return for periodic foot care and evaluation due to potential at risk complications.   Gardiner Barefoot DPM

## 2019-11-19 ENCOUNTER — Other Ambulatory Visit: Payer: Self-pay | Admitting: Physician Assistant

## 2019-12-03 ENCOUNTER — Other Ambulatory Visit: Payer: Self-pay | Admitting: Physician Assistant

## 2019-12-03 ENCOUNTER — Other Ambulatory Visit: Payer: Self-pay | Admitting: Family Medicine

## 2019-12-04 ENCOUNTER — Telehealth: Payer: Self-pay | Admitting: Family Medicine

## 2019-12-04 DIAGNOSIS — D509 Iron deficiency anemia, unspecified: Secondary | ICD-10-CM

## 2019-12-04 DIAGNOSIS — R7303 Prediabetes: Secondary | ICD-10-CM

## 2019-12-04 DIAGNOSIS — M79671 Pain in right foot: Secondary | ICD-10-CM

## 2019-12-04 DIAGNOSIS — E785 Hyperlipidemia, unspecified: Secondary | ICD-10-CM

## 2019-12-04 DIAGNOSIS — E559 Vitamin D deficiency, unspecified: Secondary | ICD-10-CM

## 2019-12-04 NOTE — Telephone Encounter (Signed)
-----   Message from Cloyd Stagers, RT sent at 12/02/2019  2:57 PM EST ----- Regarding: Lab Orders for Friday 12.17.2021 Please place lab orders for Friday 12.17.2021, office visit for physical on Tuesday 12.21.2021 Thank you, Dyke Maes RT(R)

## 2019-12-07 ENCOUNTER — Ambulatory Visit (HOSPITAL_COMMUNITY): Payer: Medicare HMO | Attending: Cardiology

## 2019-12-07 ENCOUNTER — Other Ambulatory Visit: Payer: Self-pay | Admitting: Family Medicine

## 2019-12-07 ENCOUNTER — Other Ambulatory Visit: Payer: Self-pay

## 2019-12-07 DIAGNOSIS — I480 Paroxysmal atrial fibrillation: Secondary | ICD-10-CM

## 2019-12-07 DIAGNOSIS — D7582 Heparin induced thrombocytopenia (HIT): Secondary | ICD-10-CM | POA: Diagnosis not present

## 2019-12-07 DIAGNOSIS — I2510 Atherosclerotic heart disease of native coronary artery without angina pectoris: Secondary | ICD-10-CM | POA: Diagnosis not present

## 2019-12-07 DIAGNOSIS — E785 Hyperlipidemia, unspecified: Secondary | ICD-10-CM | POA: Diagnosis not present

## 2019-12-07 DIAGNOSIS — Z952 Presence of prosthetic heart valve: Secondary | ICD-10-CM | POA: Diagnosis not present

## 2019-12-07 DIAGNOSIS — D509 Iron deficiency anemia, unspecified: Secondary | ICD-10-CM

## 2019-12-07 DIAGNOSIS — D75829 Heparin-induced thrombocytopenia, unspecified: Secondary | ICD-10-CM

## 2019-12-07 LAB — ECHOCARDIOGRAM COMPLETE
AR max vel: 0.97 cm2
AV Area VTI: 0.91 cm2
AV Area mean vel: 0.96 cm2
AV Mean grad: 23 mmHg
AV Peak grad: 48.2 mmHg
Ao pk vel: 3.47 m/s
Area-P 1/2: 1.89 cm2
P 1/2 time: 565 msec
S' Lateral: 4.4 cm

## 2019-12-12 DIAGNOSIS — I2510 Atherosclerotic heart disease of native coronary artery without angina pectoris: Secondary | ICD-10-CM | POA: Insufficient documentation

## 2019-12-12 NOTE — Progress Notes (Signed)
Cardiology Office Note   Date:  12/14/2019   ID:  Gregory [name], Aschoff 10-Apr-1950, MRN [id number]  PCP:  Jinny Sanders, MD  Cardiologist:   [name], MD   Chief Complaint  Patient presents with  . Shortness of Breath      History of Present Illness: Gregory [name] is a 69 y.o. male for follow up of CAD/CABG and AVR.   He had a cardiac catheterization during recent hospitalization on 03/04/2019 which revealed severe calcific disease in the mid LAD, the circumflex had mild to moderate proximal stenosis, the RCA, a large dominant vessel, had severe proximal stenosis and severe mid stenosis with poor flow into the distal vessel.  He was also found to have severe aortic regurgitation and moderate aortic valve stenosis, with a mean gradient across aortic valve 23mmHg with AVA, but judged to be severe aortic insufficiency in the setting of dilated aortic root with high flow from AI leading to elevated gradient across aortic valve.  He underwent coronary artery bypass graft with aortic valve replacement on 03/06/2019.  The patient had LIMA to LAD, left radial artery to PDA, predicted RIMA to the right posterior lateral artery, with open left radial artery harvest, bilateral IMA harvesting.  Also, 25 mm [name] Intuity Bovine bioprosthetic aortic valve placement.  Of note, the patient had a severe reaction to heparin with HIT and severe profound thrombocytopenia postoperatively. He had postoperative atrial fibrillation and therefore was started on amiodarone 200 mg daily.  He had a left arm hematoma and an elevated INR.  He was followed by heme.   He was started back on warfarin as his platelets increased and he now no longer needs to be seen by Heme per their report.  On a follow up echo he had a small perivalvular leak.  Otherwise it was stable.    After the last visit I ordered an echo there is some mild AI and perivalvular leak.  EF was mildly reduced and the aorta is mildly enlarged.   He works in Architect.  Is been working slower than he did before his surgery but he still doing this.  He wants to start Silver sneakers and maybe go swimming.  He is not having any new chest pressure, neck or arm discomfort.  Is not having any palpitations, presyncope or syncope.  He feels like he cannot really take as deep of breath as he used to but he is not describing PND or orthopnea.  Is not had weight gain or edema.   Past Medical History:  Diagnosis Date  . Alcohol abuse   . Aortic valve disease    AI and AS, Moderate  . Chest pain 03/2019  . Coronary artery disease   . Drug use   . Edema of both lower extremities   . Glaucoma   . Hypertension   . OSA (obstructive sleep apnea)   . Shingles     Past Surgical History:  Procedure Laterality Date  . AORTIC VALVE REPLACEMENT N/A 03/06/2019   Procedure: AORTIC VALVE REPLACEMENT (AVR), USING INTUITY 25MM;  Surgeon: Wonda Olds, MD;  Location: [place];  Service: Open Heart Surgery;  Laterality: N/A;  . CARDIAC CATHETERIZATION    . CATARACT EXTRACTION    . CORONARY ARTERY BYPASS GRAFT N/A 03/06/2019   Procedure: CORONARY ARTERY BYPASS GRAFTING (CABG), ON PUMP, TIMES THREE, USING BILATERAL INTERNAL MAMMARIES AND LEFT RADIAL ARTERY HARVEST;  Surgeon: Wonda Olds, MD;  Location: [place];  Service: Open Heart Surgery;  Laterality: N/A;  BILATERAL IMA  . LEFT HEART CATH AND CORONARY ANGIOGRAPHY N/A 03/04/2019   Procedure: LEFT HEART CATH AND CORONARY ANGIOGRAPHY;  Surgeon: Burnell Blanks, MD;  Location: [place] [facility];  Service: Cardiovascular;  Laterality: N/A;  . None    . RADIAL ARTERY HARVEST Left 03/06/2019   Procedure: RADIAL ARTERY HARVEST;  Surgeon: Wonda Olds, MD;  Location: [place];  Service: Open Heart Surgery;  Laterality: Left;  . TEE WITHOUT CARDIOVERSION N/A 03/06/2019   Procedure: TRANSESOPHAGEAL ECHOCARDIOGRAM (TEE);  Surgeon: Wonda Olds, MD;  Location: [place];  Service: Open Heart Surgery;   Laterality: N/A;     Current Outpatient Medications  Medication Sig Dispense Refill  . ALPRAZolam (XANAX) 0.5 MG tablet TAKE 1 TABLET BY MOUTH ONCE DAILY AS NEEDED FOR ANXIETY TAKE  30  MINUTES  PRIOR  TO  PLANE  FLIGHT  OR  AS  NEEDED  FOR  ANXIETY 20 tablet 0  . aspirin EC 81 MG tablet Take 1 tablet (81 mg total) by mouth daily. Swallow whole. 30 tablet 11  . atorvastatin (LIPITOR) 80 MG tablet Take 1 tablet (80 mg total) by mouth daily. 30 tablet 3  . colchicine 0.6 MG tablet Take 1 tablet (0.6 mg total) by mouth 2 (two) times daily. Prn gout 60 tablet 2  . ketoconazole (NIZORAL) 2 % shampoo Apply 1 application topically 2 (two) times a week. 120 mL 0  . latanoprost (XALATAN) 0.005 % ophthalmic solution Place 1 drop into both eyes every morning.     [name] losartan (COZAAR) 25 MG tablet Take 1 tablet (25 mg total) by mouth daily. 90 tablet 3  . metoprolol tartrate (LOPRESSOR) 25 MG tablet Take 1/2 (one-half) tablet by mouth twice daily 30 tablet 2  . Misc Natural Products (OSTEO BI-FLEX/5-LOXIN ADVANCED PO) Take 2 tablets by mouth daily.     . sertraline (ZOLOFT) 50 MG tablet Take 1 tablet by mouth once daily 30 tablet 0  . traMADol (ULTRAM) 50 MG tablet TAKE 1 TABLET BY MOUTH EVERY 4 HOURS AS NEEDED FOR PAIN 30 tablet 0  . traZODone (DESYREL) 50 MG tablet Take 0.5-1 tablets (25-50 mg total) by mouth at bedtime as needed for sleep. 30 tablet 3   No current facility-administered medications for this visit.    Allergies:   Heparin    ROS:  Please see the history of present illness.   Otherwise, review of systems are positive for none.   All other systems are reviewed and negative.    PHYSICAL EXAM: VS:  BP (!) 158/70   Pulse (!) 52  , BMI There is no height or weight on file to calculate BMI.  GENERAL:  Well appearing NECK:  No jugular venous distention, waveform within normal limits, carotid upstroke brisk and symmetric, no bruits, no thyromegaly LUNGS:  Clear to auscultation  bilaterally CHEST:  Unremarkable HEART:  PMI not displaced or sustained,S1 and S2 within normal limits, no S3, no S4, no clicks, no rubs, 3 out of 6 apical systolic murmur early to mid peaking, very soft diastolic murmur murmurs ABD:  Flat, positive bowel sounds normal in frequency in pitch, no bruits, no rebound, no guarding, no midline pulsatile mass, no hepatomegaly, no splenomegaly EXT:  2 plus pulses throughout, no edema, no cyanosis no clubbing     EKG:  EKG not ordered today. NA   Recent Labs: 03/07/2019: Magnesium 2.3 06/10/2019: BUN 15; Creatinine, Ser 0.84; Hemoglobin 10.3;  Platelets 202.0; Potassium 4.2; Sodium 136    Lipid Panel    Component Value Date/Time   CHOL 132 08/07/2019 0811   TRIG 103 08/07/2019 0811   TRIG 162 (H) 10/25/2005 1507   HDL 61 08/07/2019 0811   CHOLHDL 2.2 08/07/2019 0811   CHOLHDL 3 01/14/2018 0752   VLDL 25.8 01/14/2018 0752   LDLCALC 52 08/07/2019 0811   LDLDIRECT 151.0 01/05/2016 0748      Wt Readings from Last 3 Encounters:  08/12/19 213 lb (96.6 kg)  08/06/19 214 lb 6.4 oz (97.3 kg)  07/16/19 217 lb 6 oz (98.6 kg)      Other studies Reviewed: Additional studies/ records that were reviewed today include:   Labs Review of the above records demonstrates:  Please see elsewhere in the note.     ASSESSMENT AND PLAN:  CAD: S/P CABG.    The patient has no new sypmtoms.  No further cardiovascular testing is indicated.  We will continue with aggressive risk reduction and meds as listed.  HIT:    He carries this diagnosis now.   PAF:  He has had no symptomatic paroxysms.   S/P Bioprosthetic AoV replacement:    This was stable on the echo last week as above.  He does have some paravalvular leak and I will follow this clinically.  He understands endocarditis prophylaxis.   Hyperlipidemia:   He is to have a lipid profile.  He has orders in for this.  The LDL goal should be less then 70.  Cardiomyopathy: I am going to add Cozaar 25 mg  daily to his regimen.  He would not be able to tolerate titration of his beta-blocker.  His ejection fraction is mildly low at about the same as it had been.  Covid education: He has had his vaccine.   Current medicines are reviewed at length with the patient today.  The patient does not have concerns regarding medicines.  The following changes have been made:  no change  Labs/ tests ordered today include:   No orders of the defined types were placed in this encounter.    Disposition:   FU with me in December after the echo .    Signed, [name], MD  12/14/2019 8:32 AM    [facility]

## 2019-12-14 ENCOUNTER — Encounter: Payer: Self-pay | Admitting: Cardiology

## 2019-12-14 ENCOUNTER — Ambulatory Visit: Payer: Medicare HMO | Admitting: Cardiology

## 2019-12-14 ENCOUNTER — Other Ambulatory Visit: Payer: Self-pay

## 2019-12-14 VITALS — BP 158/70 | HR 52

## 2019-12-14 DIAGNOSIS — Z951 Presence of aortocoronary bypass graft: Secondary | ICD-10-CM | POA: Diagnosis not present

## 2019-12-14 DIAGNOSIS — I480 Paroxysmal atrial fibrillation: Secondary | ICD-10-CM | POA: Diagnosis not present

## 2019-12-14 DIAGNOSIS — E785 Hyperlipidemia, unspecified: Secondary | ICD-10-CM

## 2019-12-14 DIAGNOSIS — Z952 Presence of prosthetic heart valve: Secondary | ICD-10-CM | POA: Diagnosis not present

## 2019-12-14 DIAGNOSIS — I2510 Atherosclerotic heart disease of native coronary artery without angina pectoris: Secondary | ICD-10-CM

## 2019-12-14 MED ORDER — LOSARTAN POTASSIUM 25 MG PO TABS: 25.0000 mg | ORAL_TABLET | Freq: Every day | ORAL | 3 refills | Status: DC

## 2019-12-14 NOTE — Patient Instructions (Signed)
Medication Instructions:  Start Losartan 25mg  daily *If you need a refill on your cardiac medications before your next appointment, please call your pharmacy*  Lab Work: None ordered this visit  Testing/Procedures: None ordered this visit  Follow-Up: At [facility], you and your health needs are our priority.  As part of our continuing mission to provide you with exceptional heart care, we have created designated Provider Care Teams.  These Care Teams include your primary Cardiologist (physician) and Advanced Practice Providers (APPs -  Physician Assistants and Nurse Practitioners) who all work together to provide you with the care you need, when you need it.    Your next appointment:   3 month(s)  The format for your next appointment:   In Person  Provider:   [name], MD

## 2019-12-18 ENCOUNTER — Other Ambulatory Visit (INDEPENDENT_AMBULATORY_CARE_PROVIDER_SITE_OTHER): Payer: Medicare HMO

## 2019-12-18 ENCOUNTER — Other Ambulatory Visit: Payer: Self-pay

## 2019-12-18 ENCOUNTER — Ambulatory Visit (INDEPENDENT_AMBULATORY_CARE_PROVIDER_SITE_OTHER): Payer: Medicare HMO

## 2019-12-18 DIAGNOSIS — E785 Hyperlipidemia, unspecified: Secondary | ICD-10-CM

## 2019-12-18 DIAGNOSIS — D649 Anemia, unspecified: Secondary | ICD-10-CM | POA: Diagnosis not present

## 2019-12-18 DIAGNOSIS — Z0000 Encounter for general adult medical examination without abnormal findings: Secondary | ICD-10-CM | POA: Diagnosis not present

## 2019-12-18 DIAGNOSIS — R7303 Prediabetes: Secondary | ICD-10-CM

## 2019-12-18 DIAGNOSIS — E559 Vitamin D deficiency, unspecified: Secondary | ICD-10-CM

## 2019-12-18 DIAGNOSIS — D509 Iron deficiency anemia, unspecified: Secondary | ICD-10-CM

## 2019-12-18 LAB — COMPREHENSIVE METABOLIC PANEL
ALT: 15 U/L (ref 0–53)
AST: 39 U/L — ABNORMAL HIGH (ref 0–37)
Albumin: 4.3 g/dL (ref 3.5–5.2)
Alkaline Phosphatase: 49 U/L (ref 39–117)
BUN: 18 mg/dL (ref 6–23)
CO2: 27 mEq/L (ref 19–32)
Calcium: 8.9 mg/dL (ref 8.4–10.5)
Chloride: 104 mEq/L (ref 96–112)
Creatinine, Ser: 0.9 mg/dL (ref 0.40–1.50)
GFR: 86.04 mL/min (ref >60.00)
Glucose, Bld: 104 mg/dL — ABNORMAL HIGH (ref 70–99)
Potassium: 4.3 mEq/L (ref 3.5–5.1)
Sodium: 138 mEq/L (ref 135–145)
Total Bilirubin: 1.2 mg/dL (ref 0.2–1.2)
Total Protein: 6.6 g/dL (ref 6.0–8.3)

## 2019-12-18 LAB — CBC WITH DIFFERENTIAL/PLATELET
Basophils Absolute: 0 10*3/uL (ref 0.0–0.1)
Basophils Relative: 0.5 % (ref 0.0–3.0)
Eosinophils Absolute: 0.1 10*3/uL (ref 0.0–0.7)
Eosinophils Relative: 2 % (ref 0.0–5.0)
HCT: 26.3 % — ABNORMAL LOW (ref 39.0–52.0)
Hemoglobin: 8.1 g/dL — ABNORMAL LOW (ref 13.0–17.0)
Lymphocytes Relative: 35.4 % (ref 12.0–46.0)
Lymphs Abs: 1.6 10*3/uL (ref 0.7–4.0)
MCHC: 30.7 g/dL (ref 30.0–36.0)
MCV: 74.7 fl — ABNORMAL LOW (ref 78.0–100.0)
Monocytes Absolute: 0.5 10*3/uL (ref 0.1–1.0)
Monocytes Relative: 10.4 % (ref 3.0–12.0)
Neutro Abs: 2.3 10*3/uL (ref 1.4–7.7)
Neutrophils Relative %: 51.7 % (ref 43.0–77.0)
Platelets: 254 10*3/uL (ref 150.0–400.0)
RBC: 3.52 Mil/uL — ABNORMAL LOW (ref 4.22–5.81)
RDW: 17.5 % — ABNORMAL HIGH (ref 11.5–15.5)
WBC: 4.5 10*3/uL (ref 4.0–10.5)

## 2019-12-18 LAB — IBC PANEL
Iron: 19 ug/dL — ABNORMAL LOW (ref 42–165)
Saturation Ratios: 3.8 % — ABNORMAL LOW (ref 20.0–50.0)
Transferrin: 360 mg/dL (ref 212.0–360.0)

## 2019-12-18 LAB — LIPID PANEL
Cholesterol: 184 mg/dL (ref 0–200)
HDL: 67.5 mg/dL (ref >39.00)
LDL Cholesterol: 99 mg/dL (ref 0–99)
NonHDL: 116.03
Total CHOL/HDL Ratio: 3
Triglycerides: 83 mg/dL (ref 0.0–149.0)
VLDL: 16.6 mg/dL (ref 0.0–40.0)

## 2019-12-18 LAB — FERRITIN: Ferritin: 9.4 ng/mL — ABNORMAL LOW (ref 22.0–322.0)

## 2019-12-18 LAB — VITAMIN D 25 HYDROXY (VIT D DEFICIENCY, FRACTURES): VITD: 39.3 ng/mL (ref 30.00–100.00)

## 2019-12-18 LAB — HEMOGLOBIN A1C: Hgb A1c MFr Bld: 4.6 % (ref 4.6–6.5)

## 2019-12-18 NOTE — Progress Notes (Addendum)
Subjective:   Gregory [name] is a 69 y.o. male who presents for Medicare Annual/Subsequent preventive examination.  Review of Systems: N/A      I connected with the patient today by telephone and verified that I am speaking with the correct person using two identifiers. Location patient: home Location nurse: work Persons participating in the telephone visit: patient, nurse.   I discussed the limitations, risks, security and privacy concerns of performing an evaluation and management service by telephone and the availability of in person appointments. I also discussed with the patient that there may be a patient responsible charge related to this service. The patient expressed understanding and verbally consented to this telephonic visit.        Cardiac Risk Factors include: advanced age (>51men, >89 women);male gender;Other (see comment), Risk factor comments: hyperlipidemia     Objective:    Today's Vitals   12/18/19 0853  PainSc: 9    There is no height or weight on file to calculate BMI.  Advanced Directives 12/18/2019 03/04/2019 03/03/2019 01/30/2018  Does Patient Have a Medical Advance Directive? No - No No  Would patient like information on creating a medical advance directive? No - Patient declined No - Patient declined No - Patient declined Yes (MAU/Ambulatory/Procedural Areas - Information given)    Current Medications (verified) Outpatient Encounter Medications as of 12/18/2019  Medication Sig   ALPRAZolam (XANAX) 0.5 MG tablet TAKE 1 TABLET BY MOUTH ONCE DAILY AS NEEDED FOR ANXIETY TAKE  30  MINUTES  PRIOR  TO  PLANE  FLIGHT  OR  AS  NEEDED  FOR  ANXIETY   aspirin EC 81 MG tablet Take 1 tablet (81 mg total) by mouth daily. Swallow whole.   atorvastatin (LIPITOR) 80 MG tablet Take 1 tablet (80 mg total) by mouth daily.   colchicine 0.6 MG tablet Take 1 tablet (0.6 mg total) by mouth 2 (two) times daily. Prn gout   ketoconazole (NIZORAL) 2 % shampoo Apply 1  application topically 2 (two) times a week.   latanoprost (XALATAN) 0.005 % ophthalmic solution Place 1 drop into both eyes every morning.    losartan (COZAAR) 25 MG tablet Take 1 tablet (25 mg total) by mouth daily.   metoprolol tartrate (LOPRESSOR) 25 MG tablet Take 1/2 (one-half) tablet by mouth twice daily   Misc Natural Products (OSTEO BI-FLEX/5-LOXIN ADVANCED PO) Take 2 tablets by mouth daily.    sertraline (ZOLOFT) 50 MG tablet Take 1 tablet by mouth once daily   traMADol (ULTRAM) 50 MG tablet TAKE 1 TABLET BY MOUTH EVERY 4 HOURS AS NEEDED FOR PAIN   traZODone (DESYREL) 50 MG tablet Take 0.5-1 tablets (25-50 mg total) by mouth at bedtime as needed for sleep.   No facility-administered encounter medications on file as of 12/18/2019.    Allergies (verified) Heparin   History: Past Medical History:  Diagnosis Date   Alcohol abuse    Aortic valve disease    AI and AS, Moderate   Chest pain 03/2019   Coronary artery disease    Drug use    Edema of both lower extremities    Glaucoma    Hypertension    OSA (obstructive sleep apnea)    Shingles    Past Surgical History:  Procedure Laterality Date   AORTIC VALVE REPLACEMENT N/A 03/06/2019   Procedure: AORTIC VALVE REPLACEMENT (AVR), USING INTUITY 25MM;  Surgeon: Wonda Olds, MD;  Location: [place];  Service: Open Heart Surgery;  Laterality: N/A;  CARDIAC CATHETERIZATION     CATARACT EXTRACTION     CORONARY ARTERY BYPASS GRAFT N/A 03/06/2019   Procedure: CORONARY ARTERY BYPASS GRAFTING (CABG), ON PUMP, TIMES THREE, USING BILATERAL INTERNAL MAMMARIES AND LEFT RADIAL ARTERY HARVEST;  Surgeon: Wonda Olds, MD;  Location: [place];  Service: Open Heart Surgery;  Laterality: N/A;  BILATERAL IMA   LEFT HEART CATH AND CORONARY ANGIOGRAPHY N/A 03/04/2019   Procedure: LEFT HEART CATH AND CORONARY ANGIOGRAPHY;  Surgeon: Burnell Blanks, MD;  Location: [place] [facility];  Service: Cardiovascular;  Laterality:  N/A;   None     RADIAL ARTERY HARVEST Left 03/06/2019   Procedure: RADIAL ARTERY HARVEST;  Surgeon: Wonda Olds, MD;  Location: [place];  Service: Open Heart Surgery;  Laterality: Left;   TEE WITHOUT CARDIOVERSION N/A 03/06/2019   Procedure: TRANSESOPHAGEAL ECHOCARDIOGRAM (TEE);  Surgeon: Wonda Olds, MD;  Location: [place];  Service: Open Heart Surgery;  Laterality: N/A;   Family History  Adopted: Yes  Problem Relation Age of Onset   Cancer Mother        breast cancer   Alcohol abuse Father    Breast cancer Sister    Social History   Socioeconomic History   Marital status: Married    Spouse name: Cindi   Number of children: 3   Years of education: Not on file   Highest education level: Not on file  Occupational History   Occupation: Architect  Tobacco Use   Smoking status: Never Smoker   Smokeless tobacco: Never Used  Scientific laboratory technician Use: Some days   Substances: Mixture of cannabinoids  Substance and Sexual Activity   Alcohol use: Yes    Alcohol/week: 12.0 standard drinks    Types: 12 Cans of beer per week    Comment: weekly   Drug use: Yes    Types: Marijuana   Sexual activity: Not on file  Other Topics Concern   Not on file  Social History Narrative   Lives at home with wife.   Social Determinants of Health   Financial Resource Strain: Low Risk    Difficulty of Paying Living Expenses: Not hard at all  Food Insecurity: No Food Insecurity   Worried About Charity fundraiser in the Last Year: Never true   [place] in the Last Year: Never true  Transportation Needs: No Transportation Needs   Lack of Transportation (Medical): No   Lack of Transportation (Non-Medical): No  Physical Activity: Inactive   Days of Exercise per Week: 0 days   Minutes of Exercise per Session: 0 min  Stress: Stress Concern Present   Feeling of Stress : To some extent  Social Connections: Not on file    Tobacco Counseling Counseling  given: Not Answered   Clinical Intake:  Pre-visit preparation completed: Yes  Pain : 0-10 Pain Score: 9  Pain Type: Chronic pain Pain Location: Knee Pain Orientation: Left,Right Pain Descriptors / Indicators: Aching Pain Onset: More than a month ago Pain Frequency: Constant     Nutritional Risks: None Diabetes: No  How often do you need to have someone help you when you read instructions, pamphlets, or other written materials from your doctor or pharmacy?: 1 - Never What is the last grade level you completed in school?: 15 years of school  Diabetic: No Nutrition Risk Assessment:  Has the patient had any N/V/D within the last 2 months?  No  Does the patient have any non-healing wounds?  No  Has the patient had any unintentional weight loss or weight gain?  No   Diabetes:  Is the patient diabetic?  No  If diabetic, was a CBG obtained today?  N/A Did the patient bring in their glucometer from home?  N/A How often do you monitor your CBG's? N/A.   Financial Strains and Diabetes Management:  Are you having any financial strains with the device, your supplies or your medication? N/A.  Does the patient want to be seen by Chronic Care Management for management of their diabetes?  N/A Would the patient like to be referred to a Nutritionist or for Diabetic Management?  N/A   Interpreter Needed?: No  Information entered by :: CJohnson, LPN   Activities of Daily Living In your present state of health, do you have any difficulty performing the following activities: 12/18/2019 03/04/2019  Hearing? N N  Vision? N N  Difficulty concentrating or making decisions? Y N  Comment some memory issues at times -  Walking or climbing stairs? N Y  Dressing or bathing? N N  Doing errands, shopping? N N  Preparing Food and eating ? N -  Using the Toilet? N -  In the past six months, have you accidently leaked urine? Y -  Comment occasionally -  Do you have problems with loss of bowel  control? N -  Managing your Medications? N -  Managing your Finances? N -  Housekeeping or managing your Housekeeping? N -  Some recent data might be hidden    Patient Care Team: Jinny Sanders, MD as PCP - General (Family Medicine) [name], MD as PCP - Cardiology (Cardiology) Lendon Colonel, NP as Nurse Practitioner (Cardiology)  Indicate any recent Medical Services you may have received from other than [facility] providers in the past year (date may be approximate).     Assessment:   This is a routine wellness examination for Demorio.  Hearing/Vision screen  Hearing Screening   125Hz  250Hz  500Hz  1000Hz  2000Hz  3000Hz  4000Hz  6000Hz  8000Hz   Right ear:           Left ear:           Vision Screening Comments: Patient gets annual eye exams   Dietary issues and exercise activities discussed: Current Exercise Habits: The patient does not participate in regular exercise at present, Exercise limited by: None identified  Goals     Patient Stated     12/18/2019, I will maintain and continue medications as prescribed.       Depression Screen PHQ 2/9 Scores 12/18/2019 07/20/2019 05/26/2019 08/14/2018 12/18/2016 01/11/2015  PHQ - 2 Score 0 0 0 4 2 0  PHQ- 9 Score 0 - - 15 - -  Exception Documentation - - - Medical reason - -    Fall Risk Fall Risk  12/18/2019 05/26/2019 01/30/2018 12/18/2016 01/11/2015  Falls in the past year? 0 0 0 No Yes  Number falls in past yr: 0 - - - 1  Injury with Fall? 0 - - - Yes  Risk for fall due to : Impaired balance/gait;Medication side effect Other (Comment) - - -  Risk for fall due to: Comment - Balance screen, single leg stand less than 5 seconds. - - -  Follow up Falls evaluation completed;Falls prevention discussed Falls evaluation completed - - -    FALL RISK PREVENTION PERTAINING TO THE HOME:  Any stairs in or around the home? Yes  If so, are there any without handrails? No  Home  free of loose throw rugs in walkways, pet beds, electrical  cords, etc? Yes  Adequate lighting in your home to reduce risk of falls? Yes   ASSISTIVE DEVICES UTILIZED TO PREVENT FALLS:  Life alert? No  Use of a cane, walker or w/c? Yes  Grab bars in the bathroom? No  Shower chair or bench in shower? No  Elevated toilet seat or a handicapped toilet? No   TIMED UP AND GO:  Was the test performed? N/A, telephone visit.  Cognitive Function: MMSE - Mini Mental State Exam 12/18/2019  Orientation to time 5  Orientation to Place 5  Registration 3  Attention/ Calculation 5  Recall 3  Language- repeat 1       Mini Cog  Mini-Cog screen was completed. Maximum score is 22. A value of 0 denotes this part of the MMSE was not completed or the patient failed this part of the Mini-Cog screening.  Immunizations Immunization History  Administered Date(s) Administered   Influenza,inj,Quad PF,6+ Mos 11/11/2018   Moderna Sars-Covid-2 Vaccination 01/27/2019, 02/24/2019, 10/31/2019   Pneumococcal Conjugate-13 01/11/2015   Pneumococcal Polysaccharide-23 04/17/2016   Tdap 02/25/2012    TDAP status: Up to date  Flu Vaccine status: Due, Education has been provided regarding the importance of this vaccine. Advised may receive this vaccine at local pharmacy or Health Dept. Aware to provide a copy of the vaccination record if obtained from local pharmacy or Health Dept. Verbalized acceptance and understanding.  Pneumococcal vaccine status: Up to date  Covid-19 vaccine status: Completed vaccines  Qualifies for Shingles Vaccine? Yes   Zostavax completed No   Shingrix Completed?: No.    Education has been provided regarding the importance of this vaccine. Patient has been advised to call insurance company to determine out of pocket expense if they have not yet received this vaccine. Advised may also receive vaccine at local pharmacy or Health Dept. Verbalized acceptance and understanding.  Screening Tests Health Maintenance  Topic Date Due   INFLUENZA  VACCINE  08/02/2019   Fecal DNA (Cologuard)  12/17/2020 (Originally 08/25/1998)   TETANUS/TDAP  02/24/2022   COVID-19 Vaccine  Completed   Hepatitis C Screening  Completed   PNA vac Low Risk Adult  Completed    Health Maintenance  Health Maintenance Due  Topic Date Due   INFLUENZA VACCINE  08/02/2019    Colorectal cancer screening: declined  Lung Cancer Screening: (Low Dose CT Chest recommended if Age 7-80 years, 30 pack-year currently smoking OR have quit w/in 15 years.) does not qualify.    Additional Screening:  Hepatitis C Screening: does qualify; Completed 04/11/2016  Vision Screening: Recommended annual ophthalmology exams for early detection of glaucoma and other disorders of the eye. Is the patient up to date with their annual eye exam?  Yes  Who is the provider or what is the name of the office in which the patient attends annual eye exams? Dr. Luretha Rued, [place]  If pt is not established with a provider, would they like to be referred to a provider to establish care? No .   Dental Screening: Recommended annual dental exams for proper oral hygiene  Community Resource Referral / Chronic Care Management: CRR required this visit?  No   CCM required this visit?  No      Plan:     I have personally reviewed and noted the following in the patients chart:    Medical and social history  Use of alcohol, tobacco or illicit drugs  Current medications and supplements  Functional ability and status  Nutritional status  Physical activity  Advanced directives  List of other physicians  Hospitalizations, surgeries, and ER visits in previous 12 months  Vitals  Screenings to include cognitive, depression, and falls  Referrals and appointments  In addition, I have reviewed and discussed with patient certain preventive protocols, quality metrics, and best practice recommendations. A written personalized care plan for preventive services  as well as general preventive health recommendations were provided to patient.   Due to this being a telephonic visit, the after visit summary with patients personalized plan was offered to patient via office or my-chart. Patient preferred to pick up at office at next visit or via mychart.   Andrez Grime, LPN   [id number]

## 2019-12-18 NOTE — Patient Instructions (Signed)
Gregory [name] , Thank you for taking time to come for your Medicare Wellness Visit. I appreciate your ongoing commitment to your health goals. Please review the following plan we discussed and let me know if I can assist you in the future.   Screening recommendations/referrals: Colonoscopy: declined Recommended yearly ophthalmology/optometry visit for glaucoma screening and checkup Recommended yearly dental visit for hygiene and checkup  Vaccinations: Influenza vaccine: due, will get at office visit  Pneumococcal vaccine: Completed series Tdap vaccine: Up to date, completed 02/25/2012, due 02/2022 Shingles vaccine: due, check with your insurance regarding coverage if interested    Covid-19: Completed series  Advanced directives: Advance directive discussed with you today. Even though you declined this today please call our office should you change your mind and we can give you the proper paperwork for you to fill out.   Conditions/risks identified: hyperlipidemia  Next appointment: Follow up in one year for your annual wellness visit.   Preventive Care 45 Years and Older, Male Preventive care refers to lifestyle choices and visits with your health care provider that can promote health and wellness. What does preventive care include?  A yearly physical exam. This is also called an annual well check.  Dental exams once or twice a year.  Routine eye exams. Ask your health care provider how often you should have your eyes checked.  Personal lifestyle choices, including:  Daily care of your teeth and gums.  Regular physical activity.  Eating a healthy diet.  Avoiding tobacco and drug use.  Limiting alcohol use.  Practicing safe sex.  Taking low doses of aspirin every day.  Taking vitamin and mineral supplements as recommended by your health care provider. What happens during an annual well check? The services and screenings done by your health care provider during your  annual well check will depend on your age, overall health, lifestyle risk factors, and family history of disease. Counseling  Your health care provider may ask you questions about your:  Alcohol use.  Tobacco use.  Drug use.  Emotional well-being.  Home and relationship well-being.  Sexual activity.  Eating habits.  History of falls.  Memory and ability to understand (cognition).  Work and work Statistician. Screening  You may have the following tests or measurements:  Height, weight, and BMI.  Blood pressure.  Lipid and cholesterol levels. These may be checked every 5 years, or more frequently if you are over 31 years old.  Skin check.  Lung cancer screening. You may have this screening every year starting at age 69 if you have a 30-pack-year history of smoking and currently smoke or have quit within the past 15 years.  Fecal occult blood test (FOBT) of the stool. You may have this test every year starting at age 66.  Flexible sigmoidoscopy or colonoscopy. You may have a sigmoidoscopy every 5 years or a colonoscopy every 10 years starting at age 20.  Prostate cancer screening. Recommendations will vary depending on your family history and other risks.  Hepatitis C blood test.  Hepatitis B blood test.  Sexually transmitted disease (STD) testing.  Diabetes screening. This is done by checking your blood sugar (glucose) after you have not eaten for a while (fasting). You may have this done every 1-3 years.  Abdominal aortic aneurysm (AAA) screening. You may need this if you are a current or former smoker.  Osteoporosis. You may be screened starting at age 31 if you are at high risk. Talk with your health care provider about  your test results, treatment options, and if necessary, the need for more tests. Vaccines  Your health care provider may recommend certain vaccines, such as:  Influenza vaccine. This is recommended every year.  Tetanus, diphtheria, and  acellular pertussis (Tdap, Td) vaccine. You may need a Td booster every 10 years.  Zoster vaccine. You may need this after age 40.  Pneumococcal 13-valent conjugate (PCV13) vaccine. One dose is recommended after age 27.  Pneumococcal polysaccharide (PPSV23) vaccine. One dose is recommended after age 74. Talk to your health care provider about which screenings and vaccines you need and how often you need them. This information is not intended to replace advice given to you by your health care provider. Make sure you discuss any questions you have with your health care provider. Document Released: 01/14/2015 Document Revised: 09/07/2015 Document Reviewed: 10/19/2014 Elsevier Interactive Patient Education  2017 [place] Prevention in the Home Falls can cause injuries. They can happen to people of all ages. There are many things you can do to make your home safe and to help prevent falls. What can I do on the outside of my home?  Regularly fix the edges of walkways and driveways and fix any cracks.  Remove anything that might make you trip as you walk through a door, such as a raised step or threshold.  Trim any bushes or trees on the path to your home.  Use bright outdoor lighting.  Clear any walking paths of anything that might make someone trip, such as rocks or tools.  Regularly check to see if handrails are loose or broken. Make sure that both sides of any steps have handrails.  Any raised decks and porches should have guardrails on the edges.  Have any leaves, snow, or ice cleared regularly.  Use sand or salt on walking paths during winter.  Clean up any spills in your garage right away. This includes oil or grease spills. What can I do in the bathroom?  Use night lights.  Install grab bars by the toilet and in the tub and shower. Do not use towel bars as grab bars.  Use non-skid mats or decals in the tub or shower.  If you need to sit down in the shower, use  a plastic, non-slip stool.  Keep the floor dry. Clean up any water that spills on the floor as soon as it happens.  Remove soap buildup in the tub or shower regularly.  Attach bath mats securely with double-sided non-slip rug tape.  Do not have throw rugs and other things on the floor that can make you trip. What can I do in the bedroom?  Use night lights.  Make sure that you have a light by your bed that is easy to reach.  Do not use any sheets or blankets that are too big for your bed. They should not hang down onto the floor.  Have a firm chair that has side arms. You can use this for support while you get dressed.  Do not have throw rugs and other things on the floor that can make you trip. What can I do in the kitchen?  Clean up any spills right away.  Avoid walking on wet floors.  Keep items that you use a lot in easy-to-reach places.  If you need to reach something above you, use a strong step stool that has a grab bar.  Keep electrical cords out of the way.  Do not use floor polish or wax  that makes floors slippery. If you must use wax, use non-skid floor wax.  Do not have throw rugs and other things on the floor that can make you trip. What can I do with my stairs?  Do not leave any items on the stairs.  Make sure that there are handrails on both sides of the stairs and use them. Fix handrails that are broken or loose. Make sure that handrails are as long as the stairways.  Check any carpeting to make sure that it is firmly attached to the stairs. Fix any carpet that is loose or worn.  Avoid having throw rugs at the top or bottom of the stairs. If you do have throw rugs, attach them to the floor with carpet tape.  Make sure that you have a light switch at the top of the stairs and the bottom of the stairs. If you do not have them, ask someone to add them for you. What else can I do to help prevent falls?  Wear shoes that:  Do not have high heels.  Have  rubber bottoms.  Are comfortable and fit you well.  Are closed at the toe. Do not wear sandals.  If you use a stepladder:  Make sure that it is fully opened. Do not climb a closed stepladder.  Make sure that both sides of the stepladder are locked into place.  Ask someone to hold it for you, if possible.  Clearly mark and make sure that you can see:  Any grab bars or handrails.  First and last steps.  Where the edge of each step is.  Use tools that help you move around (mobility aids) if they are needed. These include:  Canes.  Walkers.  Scooters.  Crutches.  Turn on the lights when you go into a dark area. Replace any light bulbs as soon as they burn out.  Set up your furniture so you have a clear path. Avoid moving your furniture around.  If any of your floors are uneven, fix them.  If there are any pets around you, be aware of where they are.  Review your medicines with your doctor. Some medicines can make you feel dizzy. This can increase your chance of falling. Ask your doctor what other things that you can do to help prevent falls. This information is not intended to replace advice given to you by your health care provider. Make sure you discuss any questions you have with your health care provider. Document Released: 10/14/2008 Document Revised: 05/26/2015 Document Reviewed: 01/22/2014 Elsevier Interactive Patient Education  2017 [employer].

## 2019-12-18 NOTE — Progress Notes (Signed)
PCP notes:  Health Maintenance: Cologuard- declined Flu- due   Abnormal Screenings: none   Patient concerns: Needs all medications refilled Has been out of his Lipitor for 2 weeks now.    Nurse concerns: none   Next PCP appt.: 12/22/2019 @ 8:40 am

## 2019-12-22 ENCOUNTER — Telehealth: Payer: Self-pay

## 2019-12-22 ENCOUNTER — Other Ambulatory Visit: Payer: Self-pay

## 2019-12-22 ENCOUNTER — Encounter: Payer: Self-pay | Admitting: Family Medicine

## 2019-12-22 ENCOUNTER — Ambulatory Visit (INDEPENDENT_AMBULATORY_CARE_PROVIDER_SITE_OTHER): Payer: Medicare HMO | Admitting: Family Medicine

## 2019-12-22 VITALS — BP 140/68 | HR 56 | Temp 97.7°F | Ht 66.5 in | Wt 222.8 lb

## 2019-12-22 DIAGNOSIS — D75829 Heparin-induced thrombocytopenia, unspecified: Secondary | ICD-10-CM

## 2019-12-22 DIAGNOSIS — F411 Generalized anxiety disorder: Secondary | ICD-10-CM

## 2019-12-22 DIAGNOSIS — E7800 Pure hypercholesterolemia, unspecified: Secondary | ICD-10-CM

## 2019-12-22 DIAGNOSIS — R7303 Prediabetes: Secondary | ICD-10-CM | POA: Diagnosis not present

## 2019-12-22 DIAGNOSIS — D689 Coagulation defect, unspecified: Secondary | ICD-10-CM | POA: Diagnosis not present

## 2019-12-22 DIAGNOSIS — Z23 Encounter for immunization: Secondary | ICD-10-CM | POA: Diagnosis not present

## 2019-12-22 DIAGNOSIS — Z6834 Body mass index (BMI) 34.0-34.9, adult: Secondary | ICD-10-CM

## 2019-12-22 DIAGNOSIS — I10 Essential (primary) hypertension: Secondary | ICD-10-CM | POA: Diagnosis not present

## 2019-12-22 DIAGNOSIS — Z0000 Encounter for general adult medical examination without abnormal findings: Secondary | ICD-10-CM | POA: Diagnosis not present

## 2019-12-22 DIAGNOSIS — I480 Paroxysmal atrial fibrillation: Secondary | ICD-10-CM

## 2019-12-22 DIAGNOSIS — E559 Vitamin D deficiency, unspecified: Secondary | ICD-10-CM | POA: Diagnosis not present

## 2019-12-22 DIAGNOSIS — D509 Iron deficiency anemia, unspecified: Secondary | ICD-10-CM

## 2019-12-22 DIAGNOSIS — D7582 Heparin induced thrombocytopenia (HIT): Secondary | ICD-10-CM

## 2019-12-22 DIAGNOSIS — E661 Drug-induced obesity: Secondary | ICD-10-CM

## 2019-12-22 LAB — CBC WITH DIFFERENTIAL/PLATELET
Basophils Absolute: 0 10*3/uL (ref 0.0–0.1)
Basophils Relative: 0.7 % (ref 0.0–3.0)
Eosinophils Absolute: 0.1 10*3/uL (ref 0.0–0.7)
Eosinophils Relative: 2 % (ref 0.0–5.0)
HCT: 26 % — ABNORMAL LOW (ref 39.0–52.0)
Hemoglobin: 7.8 g/dL — CL (ref 13.0–17.0)
Lymphocytes Relative: 31.6 % (ref 12.0–46.0)
Lymphs Abs: 1.5 10*3/uL (ref 0.7–4.0)
MCHC: 30.1 g/dL (ref 30.0–36.0)
MCV: 76.8 fl — ABNORMAL LOW (ref 78.0–100.0)
Monocytes Absolute: 0.5 10*3/uL (ref 0.1–1.0)
Monocytes Relative: 9.5 % (ref 3.0–12.0)
Neutro Abs: 2.8 10*3/uL (ref 1.4–7.7)
Neutrophils Relative %: 56.2 % (ref 43.0–77.0)
Platelets: 240 10*3/uL (ref 150.0–400.0)
RBC: 3.38 Mil/uL — ABNORMAL LOW (ref 4.22–5.81)
RDW: 18.2 % — ABNORMAL HIGH (ref 11.5–15.5)
WBC: 4.9 10*3/uL (ref 4.0–10.5)

## 2019-12-22 LAB — POCT URINALYSIS DIPSTICK
Bilirubin, UA: 1
Glucose, UA: POSITIVE — AB
Ketones, UA: NEGATIVE
Leukocytes, UA: NEGATIVE
Nitrite, UA: NEGATIVE
Protein, UA: POSITIVE — AB
Spec Grav, UA: >=1.03 — AB (ref 1.010–1.025)
Urobilinogen, UA: 0.2 E.U./dL
pH, UA: 5.5 (ref 5.0–8.0)

## 2019-12-22 MED ORDER — SERTRALINE HCL 50 MG PO TABS: 50.0000 mg | ORAL_TABLET | Freq: Every day | ORAL | 3 refills | Status: DC

## 2019-12-22 MED ORDER — ATORVASTATIN CALCIUM 80 MG PO TABS: 80.0000 mg | ORAL_TABLET | Freq: Every day | ORAL | 3 refills | Status: DC

## 2019-12-22 MED ORDER — TRAZODONE HCL 50 MG PO TABS: 25.0000 mg | ORAL_TABLET | Freq: Every evening | ORAL | 3 refills | Status: DC | PRN

## 2019-12-22 NOTE — Assessment & Plan Note (Signed)
Stable, chronic.  Continue current medication.   Sertraline 50 mg daily  trazodone 2-3 nights a week for sleep prn, otherwise uses CBD.  Not using alprazolam.

## 2019-12-22 NOTE — Assessment & Plan Note (Signed)
Stable platelets.

## 2019-12-22 NOTE — Assessment & Plan Note (Signed)
Followed by cardiology. ON ASA given paroxsysmal.

## 2019-12-22 NOTE — Assessment & Plan Note (Signed)
Resolved with diet changes, less beer.

## 2019-12-22 NOTE — Patient Instructions (Addendum)
Work on low cholesterol low animal fat diet.  Restart atorvastatin daily.  Continue ferrous sulfate 2 tabs daily.  Please stop at the lab to have labs drawn.

## 2019-12-22 NOTE — Telephone Encounter (Signed)
See other result not for UA for discussion of plan.

## 2019-12-22 NOTE — Addendum Note (Signed)
Addended by: Ronna Polio on: 12/22/2019 09:56 AM   Modules accepted: Orders

## 2019-12-22 NOTE — Progress Notes (Signed)
Patient ID: Gregory [name], male    DOB: [birth year], 69 y.o.   MRN: [id number]  This visit was conducted in person.  BP 140/68 (BP Location: Left Arm, Patient Position: Sitting)   Pulse (!) 56   Temp 97.7 F (36.5 C) (Temporal)   Ht 5' 6.5" (1.689 m)   Wt 222 lb 12.8 oz (101.1 kg)   SpO2 98%   BMI 35.42 kg/m    CC:  Annual physical with review of chronic health issues. Subjective:   HPI: Gregory [name] is a 69 y.o. male presenting on 12/22/2019 for Annual Exam    The patient presents for complete physical and review of chronic health problems. He/She also has the following acute concerns today:  Drop in hg, anemia noted on labs  The patient saw a LPN or RN for medicare wellness visit.  Prevention and wellness was reviewed in detail. Note reviewed and important notes copied below.  Health Maintenance: Cologuard- declined Flu- due   Abnormal Screenings: none  Patient concerns: Needs all medications refilled Has been out of his Lipitor for 2 weeks now.     12/22/19  Anemia, severe, recently worsened on routine testing unclear cause: iron deficiency. He has now started on He has noted increase in fatigue in last few weeks. No nose bleeds, no blood in stool or urine. Ferrous sulfate 325 mg  2 daily ( was taking it weekly)  Hypertension:  Borderline control on current regimen in office today, but better with losartan 25 mg daily  BP Readings from Last 3 Encounters:  12/22/19 140/68  12/14/19 (!) 158/70  08/12/19 118/70  Using medication without problems or lightheadedness:  none Chest pain with exertion:none Edema: stable with compression hose. Short of breath:  mild occ with exertion but not worse than usual Average home BPs: Other issues:  Prediabetes .. resolved. Lab Results  Component Value Date   HGBA1C 4.6 12/18/2019     Elevated Cholesterol: On atorvastatin 80 mg daily .[name] LDL not at goal < 70 but he has not been on for 2 weeks. Lab  Results  Component Value Date   CHOL 184 12/18/2019   HDL 67.50 12/18/2019   LDLCALC 99 12/18/2019   LDLDIRECT 151.0 01/05/2016   TRIG 83.0 12/18/2019   CHOLHDL 3 12/18/2019  Using medications without problems: Muscle aches:  Diet compliance: moderate Exercise: minimal Other complaints: CAD, PAF ( on ASA 81 anticoagulant), cardiomyopathy followed by cardiology. Last OV from 12/14/2019 reviewed.  The patient had a severe reaction to heparin with HIT and severe profound thrombocytopenia postoperatively to CABG in 03/2019.  Platelets are 254.  GAD: tolerable control, somewhat worse with holiday on sertraline, trazodone 2-3 nights a week and  Alprazolam prn.  Patient Care Team: Excell Seltzer, MD as PCP - General (Family Medicine) Rollene Rotunda, MD as PCP - Cardiology (Cardiology) Jodelle Gross, NP as Nurse Practitioner (Cardiology)    Relevant past medical, surgical, family and social history reviewed and updated as indicated. Interim medical history since our last visit reviewed. Allergies and medications reviewed and updated. Outpatient Medications Prior to Visit  Medication Sig Dispense Refill  . ALPRAZolam (XANAX) 0.5 MG tablet TAKE 1 TABLET BY MOUTH ONCE DAILY AS NEEDED FOR ANXIETY TAKE  30  MINUTES  PRIOR  TO  PLANE  FLIGHT  OR  AS  NEEDED  FOR  ANXIETY 20 tablet 0  . aspirin EC 81 MG tablet Take 1 tablet (81 mg total) by mouth daily. Swallow  whole. 30 tablet 11  . atorvastatin (LIPITOR) 80 MG tablet Take 1 tablet (80 mg total) by mouth daily. 30 tablet 3  . colchicine 0.6 MG tablet Take 1 tablet (0.6 mg total) by mouth 2 (two) times daily. Prn gout 60 tablet 2  . ketoconazole (NIZORAL) 2 % shampoo Apply 1 application topically 2 (two) times a week. 120 mL 0  . latanoprost (XALATAN) 0.005 % ophthalmic solution Place 1 drop into both eyes every morning.     [name] losartan (COZAAR) 25 MG tablet Take 1 tablet (25 mg total) by mouth daily. 90 tablet 3  . metoprolol tartrate  (LOPRESSOR) 25 MG tablet Take 1/2 (one-half) tablet by mouth twice daily 30 tablet 2  . Misc Natural Products (OSTEO BI-FLEX/5-LOXIN ADVANCED PO) Take 2 tablets by mouth daily.     . sertraline (ZOLOFT) 50 MG tablet Take 1 tablet by mouth once daily 30 tablet 0  . traMADol (ULTRAM) 50 MG tablet TAKE 1 TABLET BY MOUTH EVERY 4 HOURS AS NEEDED FOR PAIN 30 tablet 0  . traZODone (DESYREL) 50 MG tablet Take 0.5-1 tablets (25-50 mg total) by mouth at bedtime as needed for sleep. 30 tablet 3   No facility-administered medications prior to visit.     Per HPI unless specifically indicated in ROS section below Review of Systems  Constitutional: Positive for fatigue. Negative for fever.  HENT: Negative for ear pain.   Eyes: Negative for pain.  Respiratory: Negative for cough and shortness of breath.   Cardiovascular: Negative for chest pain, palpitations and leg swelling.  Gastrointestinal: Negative for abdominal pain.  Genitourinary: Negative for dysuria.  Musculoskeletal: Negative for arthralgias.  Neurological: Negative for syncope, light-headedness and headaches.  Psychiatric/Behavioral: Negative for dysphoric mood.   Objective:  BP 140/68 (BP Location: Left Arm, Patient Position: Sitting)   Pulse (!) 56   Temp 97.7 F (36.5 C) (Temporal)   Ht 5' 6.5" (1.689 m)   Wt 222 lb 12.8 oz (101.1 kg)   SpO2 98%   BMI 35.42 kg/m   Wt Readings from Last 3 Encounters:  12/22/19 222 lb 12.8 oz (101.1 kg)  08/12/19 213 lb (96.6 kg)  08/06/19 214 lb 6.4 oz (97.3 kg)      Physical Exam Constitutional:      General: Vital signs are normal.     Appearance: He is well-developed and well-nourished. He is obese.  HENT:     Head: Normocephalic.     Right Ear: Hearing normal.     Left Ear: Hearing normal.     Nose: Nose normal.     Mouth/Throat:     Mouth: Oropharynx is clear and moist and mucous membranes are normal.  Neck:     Thyroid: No thyroid mass or thyromegaly.     Vascular: No carotid  bruit.     Trachea: Trachea normal.  Cardiovascular:     Rate and Rhythm: Normal rate and regular rhythm.     Pulses: Normal pulses.     Heart sounds: Heart sounds not distant. Murmur heard.   Systolic murmur is present with a grade of 2/6. No friction rub. No gallop.      Comments: No peripheral edema.[name] but wearing compression hose Pulmonary:     Effort: Pulmonary effort is normal. No respiratory distress.     Breath sounds: Normal breath sounds.  Skin:    General: Skin is warm, dry and intact.     Findings: No rash.  Psychiatric:  Mood and Affect: Mood and affect normal.        Speech: Speech normal.        Behavior: Behavior normal.        Thought Content: Thought content normal.       Results for orders placed or performed in visit on 12/18/19  Ferritin  Result Value Ref Range   Ferritin 9.4 (L) 22.0 - 322.0 ng/mL  IBC panel([facility])  Result Value Ref Range   Iron 19 (L) 42 - 165 ug/dL   Transferrin 360.0 212.0 - 360.0 mg/dL   Saturation Ratios 3.8 (L) 20.0 - 50.0 %    This visit occurred during the SARS-CoV-2 public health emergency.  Safety protocols were in place, including screening questions prior to the visit, additional usage of staff PPE, and extensive cleaning of exam room while observing appropriate contact time as indicated for disinfecting solutions.   COVID 19 screen:  No recent travel or known exposure to COVID19 The patient denies respiratory symptoms of COVID 19 at this time. The importance of social distancing was discussed today.   Assessment and Plan   The patient's preventative maintenance and recommended screening tests for an annual wellness exam were reviewed in full today. Brought up to date unless services declined.  Counselled on the importance of diet, exercise, and its role in overall health and mortality. The patient's FH and SH was reviewed, including their home life, tobacco status, and drug and alcohol status.   Vaccines: given  flu.. uptodate with PNA, Tdap.. Consider shingles S/P COVID x 3 Prostate Cancer Screen:   Not indicated given age. Colon Cancer Screen:  COloguard palnned      Smoking Status:no smoker ETOH/ drug use: beer 8-10 a week, half as much as last year. Hep C: neg HIV screen:  refused  Problem List Items Addressed This Visit    Anemia    Unclear etiology.. eval with Cologuard, UA.  Re-eval cbc for further drop.  Continue ferrous sulfate 325 mg BID       Relevant Orders   CBC with Differential/Platelet   Atrial fibrillation ([place])    Followed by cardiology. ON ASA given paroxsysmal.      Relevant Medications   atorvastatin (LIPITOR) 80 MG tablet   Class 1 drug-induced obesity with serious comorbidity and body mass index (BMI) of 34.0 to 34.9 in adult   Coagulation disorder (HCC)   Essential hypertension    Stable, chronic.  Continue current medication.   Losartan 25 mg daily      Relevant Medications   atorvastatin (LIPITOR) 80 MG tablet   GAD (generalized anxiety disorder)    Stable, chronic.  Continue current medication.   Sertraline 50 mg daily  trazodone 2-3 nights a week for sleep prn, otherwise uses CBD.  Not using alprazolam.      Relevant Medications   sertraline (ZOLOFT) 50 MG tablet   traZODone (DESYREL) 50 MG tablet   High cholesterol    Worsened, chronic.  Off lipitor given refill issue. Refill sent in. Restart lipitor , work on lifestyle changes. Re-eval in 3 months.         Relevant Medications   atorvastatin (LIPITOR) 80 MG tablet   HIT (heparin-induced thrombocytopenia) (HCC)    Stable platelets.      Prediabetes    Resolved with diet changes, less beer.      Vitamin D deficiency    Other Visit Diagnoses    Routine general medical examination at a health care facility    -  Primary   Need for influenza vaccination       Relevant Orders   Flu Vaccine QUAD High Dose(Fluad) (Completed)      [name], MD

## 2019-12-22 NOTE — Telephone Encounter (Signed)
[facility] called critical results @ 1430  Hemoglobin 7.8

## 2019-12-22 NOTE — Assessment & Plan Note (Signed)
Worsened, chronic.  Off lipitor given refill issue. Refill sent in. Restart lipitor , work on lifestyle changes. Re-eval in 3 months.

## 2019-12-22 NOTE — Assessment & Plan Note (Signed)
Unclear etiology.. eval with Cologuard, UA.  Re-eval cbc for further drop.  Continue ferrous sulfate 325 mg BID

## 2019-12-22 NOTE — Assessment & Plan Note (Signed)
Stable, chronic.  Continue current medication.  Losartan 25 mg daily 

## 2019-12-29 ENCOUNTER — Other Ambulatory Visit: Payer: Self-pay | Admitting: Family Medicine

## 2019-12-29 NOTE — Telephone Encounter (Signed)
Last office visit 12/22/2019 for CPE.  Last refilled 09/09/2019 for #30 with no refills.  No future appointments with PCP.

## 2020-01-08 ENCOUNTER — Other Ambulatory Visit: Payer: Self-pay | Admitting: Family Medicine

## 2020-02-04 DIAGNOSIS — H5213 Myopia, bilateral: Secondary | ICD-10-CM | POA: Diagnosis not present

## 2020-02-12 ENCOUNTER — Other Ambulatory Visit: Payer: Self-pay | Admitting: Physician Assistant

## 2020-03-03 ENCOUNTER — Ambulatory Visit: Payer: Medicare HMO | Admitting: Podiatry

## 2020-03-07 ENCOUNTER — Telehealth: Payer: Self-pay

## 2020-03-07 DIAGNOSIS — M170 Bilateral primary osteoarthritis of knee: Secondary | ICD-10-CM | POA: Diagnosis not present

## 2020-03-07 NOTE — Telephone Encounter (Signed)
Primary Cardiologist:James Hochrein, MD  Chart reviewed as part of pre-operative protocol coverage. Because of Gregory [name]'s past medical history and time since last visit, he/she will require a follow-up visit in order to better assess preoperative cardiovascular risk.  Pre-op covering staff: - Please schedule appointment and call patient to inform them. - Please contact requesting surgeon's office via preferred method (i.e, phone, fax) to inform them of need for appointment prior to surgery.  If applicable, this message will also be routed to pharmacy pool and/or primary cardiologist for input on holding anticoagulant/antiplatelet agent as requested below so that this information is available at time of patient's appointment.   Deberah Pelton, NP  03/07/2020, 4:35 PM

## 2020-03-07 NOTE — Telephone Encounter (Signed)
   [facility] Pre-operative Risk Assessment    Request for surgical clearance:  1. What type of surgery is being performed? TOTAL KNEE ARTHROPLASTY   2. When is this surgery scheduled? TBD   3. What type of clearance is required (medical clearance vs. Pharmacy clearance to hold med vs. Both)? MEDICAL  4. Are there any medications that need to be held prior to surgery and how long? NONE    5. Practice name and name of physician performing surgery? [facility]  ATTN:KELLY   6. What is the office phone number? [phone]   7.   What is the office fax number? [phone]  8.   Anesthesia type (None, local, MAC, general) ? SPINAL

## 2020-03-07 NOTE — Telephone Encounter (Signed)
Pt has appt scheduled 03-14-2020 @ 920am

## 2020-03-10 ENCOUNTER — Telehealth: Payer: Self-pay | Admitting: Family Medicine

## 2020-03-10 DIAGNOSIS — D509 Iron deficiency anemia, unspecified: Secondary | ICD-10-CM

## 2020-03-10 DIAGNOSIS — R7303 Prediabetes: Secondary | ICD-10-CM

## 2020-03-10 DIAGNOSIS — E559 Vitamin D deficiency, unspecified: Secondary | ICD-10-CM

## 2020-03-10 DIAGNOSIS — E7800 Pure hypercholesterolemia, unspecified: Secondary | ICD-10-CM

## 2020-03-10 NOTE — Telephone Encounter (Signed)
-----   Message from Ellamae Sia sent at 03/07/2020 10:46 AM EST ----- Regarding: Lab orders for Monday, 3.21.22 Lab orders, no f/u

## 2020-03-13 DIAGNOSIS — I420 Dilated cardiomyopathy: Secondary | ICD-10-CM | POA: Insufficient documentation

## 2020-03-13 DIAGNOSIS — E785 Hyperlipidemia, unspecified: Secondary | ICD-10-CM | POA: Insufficient documentation

## 2020-03-13 NOTE — Progress Notes (Signed)
Cardiology Office Note   Date:  03/14/2020   ID:  Gregory, [name] 1950-08-22, MRN [id number]  PCP:  Jinny Sanders, MD  Cardiologist:   [name], MD   No chief complaint on file.     History of Present Illness: Gregory [name] is a 70 y.o. male for follow up of CAD/CABG and AVR.   He had a cardiac catheterization during recent hospitalization on 03/04/2019 which revealed severe calcific disease in the mid LAD, the circumflex had mild to moderate proximal stenosis, the RCA, a large dominant vessel, had severe proximal stenosis and severe mid stenosis with poor flow into the distal vessel.  He was also found to have severe aortic regurgitation and moderate aortic valve stenosis, with a mean gradient across aortic valve 69mmHg with AVA, but judged to be severe aortic insufficiency in the setting of dilated aortic root with high flow from AI leading to elevated gradient across aortic valve.  He underwent coronary artery bypass graft with aortic valve replacement on 03/06/2019.  The patient had LIMA to LAD, left radial artery to PDA, predicted RIMA to the right posterior lateral artery, with open left radial artery harvest, bilateral IMA harvesting.  Also, 25 mm [name] Intuity Bovine bioprosthetic aortic valve placement.  Of note, the patient had a severe reaction to heparin with HIT and severe profound thrombocytopenia postoperatively. He had postoperative atrial fibrillation and therefore was started on amiodarone 200 mg daily.  He had a left arm hematoma and an elevated INR.  He was followed by heme.   He was started back on warfarin as his platelets increased and he now no longer needs to be seen by Heme per their report.  On a follow an echo he had a small perivalvular leak.  Otherwise it was stable.  The most recent echo was in Dec   He is now preop for for knee replacement.  He will likely have the left done first and probably will need to have the right.  This probably will be  about 4 weeks.  He has had about 3 episodes of gout since his surgery.  Despite having to use a cane he still walks and does his work in [employer].  He says stairs are hard because of the knee pain but he does not report shortness of breath, PND or orthopnea.  He does not have any presyncope or syncope despite his bradycardia.  He has no chest pressure, neck or arm discomfort.  He has had no weight gain or edema.   Past Medical History:  Diagnosis Date  . Alcohol abuse   . Aortic valve disease    AI and AS, Moderate  . Chest pain 03/2019  . Coronary artery disease   . Drug use   . Edema of both lower extremities   . Glaucoma   . Hypertension   . OSA (obstructive sleep apnea)   . Shingles     Past Surgical History:  Procedure Laterality Date  . AORTIC VALVE REPLACEMENT N/A 03/06/2019   Procedure: AORTIC VALVE REPLACEMENT (AVR), USING INTUITY 25MM;  Surgeon: Wonda Olds, MD;  Location: [place];  Service: Open Heart Surgery;  Laterality: N/A;  . CARDIAC CATHETERIZATION    . CATARACT EXTRACTION    . CORONARY ARTERY BYPASS GRAFT N/A 03/06/2019   Procedure: CORONARY ARTERY BYPASS GRAFTING (CABG), ON PUMP, TIMES THREE, USING BILATERAL INTERNAL MAMMARIES AND LEFT RADIAL ARTERY HARVEST;  Surgeon: Wonda Olds, MD;  Location: [place];  Service: Open Heart Surgery;  Laterality: N/A;  BILATERAL IMA  . LEFT HEART CATH AND CORONARY ANGIOGRAPHY N/A 03/04/2019   Procedure: LEFT HEART CATH AND CORONARY ANGIOGRAPHY;  Surgeon: Burnell Blanks, MD;  Location: [facility];  Service: Cardiovascular;  Laterality: N/A;  . None    . RADIAL ARTERY HARVEST Left 03/06/2019   Procedure: RADIAL ARTERY HARVEST;  Surgeon: Wonda Olds, MD;  Location: [place];  Service: Open Heart Surgery;  Laterality: Left;  . TEE WITHOUT CARDIOVERSION N/A 03/06/2019   Procedure: TRANSESOPHAGEAL ECHOCARDIOGRAM (TEE);  Surgeon: Wonda Olds, MD;  Location: [place];  Service: Open Heart Surgery;  Laterality: N/A;      Current Outpatient Medications  Medication Sig Dispense Refill  . ALPRAZolam (XANAX) 0.5 MG tablet TAKE 1 TABLET BY MOUTH ONCE DAILY AS NEEDED FOR ANXIETY TAKE  30  MINUTES  PRIOR  TO  PLANE  FLIGHT  OR  AS  NEEDED  FOR  ANXIETY 20 tablet 0  . aspirin EC 81 MG tablet Take 1 tablet (81 mg total) by mouth daily. Swallow whole. 30 tablet 11  . atorvastatin (LIPITOR) 80 MG tablet Take 1 tablet (80 mg total) by mouth daily. 90 tablet 3  . colchicine 0.6 MG tablet Take 2 tablets by mouth once daily 60 tablet 5  . ketoconazole (NIZORAL) 2 % shampoo Apply 1 application topically 2 (two) times a week. 120 mL 0  . latanoprost (XALATAN) 0.005 % ophthalmic solution Place 1 drop into both eyes every morning.     . metoprolol tartrate (LOPRESSOR) 25 MG tablet Take 1/2 (one-half) tablet by mouth twice daily 30 tablet 2  . Misc Natural Products (OSTEO BI-FLEX/5-LOXIN ADVANCED PO) Take 2 tablets by mouth daily.     . sertraline (ZOLOFT) 50 MG tablet Take 1 tablet (50 mg total) by mouth daily. 90 tablet 3  . traMADol (ULTRAM) 50 MG tablet TAKE 1 TABLET BY MOUTH EVERY 4 HOURS AS NEEDED FOR PAIN 30 tablet 0  . traZODone (DESYREL) 50 MG tablet Take 0.5-1 tablets (25-50 mg total) by mouth at bedtime as needed for sleep. 30 tablet 3  . losartan (COZAAR) 50 MG tablet Take 1 tablet (50 mg total) by mouth daily. 90 tablet 3   No current facility-administered medications for this visit.    Allergies:   Heparin    ROS:  Please see the history of present illness.   Otherwise, review of systems are positive for none.   All other systems are reviewed and negative.    PHYSICAL EXAM: VS:  BP (!) 152/64   Pulse (!) 45   Ht 5\' 7"  (1.702 m)   Wt 220 lb (99.8 kg)   SpO2 94%   BMI 34.46 kg/m  , BMI Body mass index is 34.46 kg/m.  GENERAL:  Well appearing NECK:  No jugular venous distention, waveform within normal limits, carotid upstroke brisk and symmetric, no bruits, no thyromegaly LUNGS:  Clear to  auscultation bilaterally CHEST:  Well healed sternotomy scar. HEART:  PMI not displaced or sustained,S1 and S2 within normal limits, no S3, no S4, no clicks, no rubs, 3 out of 6 apical systolic murmur radiating out aortic outflow tract, slight diastolic murmurs ABD:  Flat, positive bowel sounds normal in frequency in pitch, no bruits, no rebound, no guarding, no midline pulsatile mass, no hepatomegaly, no splenomegaly EXT:  2 plus pulses throughout, no edema, no cyanosis no clubbing    EKG:  EKG  ordered today. Sinus bradycardia,  rate 45, left bundle branch block   Recent Labs: 12/18/2019: ALT 15; BUN 18; Creatinine, Ser 0.90; Potassium 4.3; Sodium 138 12/22/2019: Hemoglobin 7.8 Repeated and verified X2.; Platelets 240.0    Lipid Panel    Component Value Date/Time   CHOL 184 12/18/2019 0753   CHOL 132 08/07/2019 0811   TRIG 83.0 12/18/2019 0753   TRIG 162 (H) 10/25/2005 1507   HDL 67.50 12/18/2019 0753   HDL 61 08/07/2019 0811   CHOLHDL 3 12/18/2019 0753   VLDL 16.6 12/18/2019 0753   LDLCALC 99 12/18/2019 0753   LDLCALC 52 08/07/2019 0811   LDLDIRECT 151.0 01/05/2016 0748      Wt Readings from Last 3 Encounters:  03/14/20 220 lb (99.8 kg)  12/22/19 222 lb 12.8 oz (101.1 kg)  08/12/19 213 lb (96.6 kg)      Other studies Reviewed: Additional studies/ records that were reviewed today include:   Echo Review of the above records demonstrates:  Please see elsewhere in the note.     ASSESSMENT AND PLAN:  CAD: S/P CABG.     The patient has no new sypmtoms.  No further cardiovascular testing is indicated.  We will continue with aggressive risk reduction and meds as listed.  HIT:    He is aware that he carries this diagnosis and to notify everybody before any procedures.   S/P Bioprosthetic AoV replacement:    This was stable in Dec 2021.  He does have a mean gradient of 21.5 and some perivavular leak bu these were stable and I will repeat that later this year.  He  understands endocarditis prophylaxis  Hyperlipidemia:   LDL was 99 with HDL 67.  He has room to move on his diet.  I will repeat lipids at an upcoming appt and if not less than 70 LDL I will change to Crestor.   Cardiomyopathy:    I will increase his Cozaar to 50 mg daily.   HTN:  This is being treated in the context of treating his reduced EF.  BRADYCARDIA:  He tolerates this rhythm.  If he has any symptoms going forward we could stop the beta blocker.    GOUT:  I sent a message to Jinny Sanders, MD to see he should be on Allopurinol.    PREOP: Patient is at acceptable risk for the planned surgery.  No further testing is indicated.   Current medicines are reviewed at length with the patient today.  The patient does not have concerns regarding medicines.  The following changes have been made:   As above Labs/ tests ordered today include:   Orders Placed This Encounter  Procedures  . EKG 12-Lead     Disposition:   FU with me in six months.     Signed, [name], MD  03/14/2020 9:37 AM    [facility]

## 2020-03-14 ENCOUNTER — Ambulatory Visit: Payer: Medicare HMO | Admitting: Cardiology

## 2020-03-14 ENCOUNTER — Encounter: Payer: Self-pay | Admitting: Cardiology

## 2020-03-14 ENCOUNTER — Telehealth: Payer: Self-pay | Admitting: *Deleted

## 2020-03-14 ENCOUNTER — Other Ambulatory Visit: Payer: Self-pay

## 2020-03-14 VITALS — BP 152/64 | HR 45 | Ht 67.0 in | Wt 220.0 lb

## 2020-03-14 DIAGNOSIS — I420 Dilated cardiomyopathy: Secondary | ICD-10-CM

## 2020-03-14 DIAGNOSIS — Z01810 Encounter for preprocedural cardiovascular examination: Secondary | ICD-10-CM | POA: Diagnosis not present

## 2020-03-14 DIAGNOSIS — I480 Paroxysmal atrial fibrillation: Secondary | ICD-10-CM

## 2020-03-14 DIAGNOSIS — Z952 Presence of prosthetic heart valve: Secondary | ICD-10-CM

## 2020-03-14 DIAGNOSIS — E785 Hyperlipidemia, unspecified: Secondary | ICD-10-CM | POA: Diagnosis not present

## 2020-03-14 MED ORDER — LOSARTAN POTASSIUM 50 MG PO TABS: 50.0000 mg | ORAL_TABLET | Freq: Every day | ORAL | 3 refills | Status: DC

## 2020-03-14 NOTE — Telephone Encounter (Signed)
Please schedule Gregory [name] an appointment with Dr. Diona Browner for Gout.

## 2020-03-14 NOTE — Telephone Encounter (Signed)
Spoke with patient scheduled appointment

## 2020-03-14 NOTE — Patient Instructions (Signed)
Medication Instructions:  INCREASE- Losartan 50 mg by mouth daily  *If you need a refill on your cardiac medications before your next appointment, please call your pharmacy*   Lab Work: None Ordered   Testing/Procedures: None ordered   Follow-Up: At [facility], you and your health needs are our priority.  As part of our continuing mission to provide you with exceptional heart care, we have created designated Provider Care Teams.  These Care Teams include your primary Cardiologist (physician) and Advanced Practice Providers (APPs -  Physician Assistants and Nurse Practitioners) who all work together to provide you with the care you need, when you need it.  We recommend signing up for the patient portal called "MyChart".  Sign up information is provided on this After Visit Summary.  MyChart is used to connect with patients for Virtual Visits (Telemedicine).  Patients are able to view lab/test results, encounter notes, upcoming appointments, etc.  Non-urgent messages can be sent to your provider as well.   To learn more about what you can do with MyChart, go to [email or web address].    Your next appointment:   6 month(s)  The format for your next appointment:   In Person  Provider:   You may see [name], MD or one of the following Advanced Practice Providers on your designated Care Team:    Rosaria Ferries, PA-C  Jory Sims, DNP, ANP

## 2020-03-14 NOTE — Telephone Encounter (Signed)
-----   Message from Jinny Sanders, MD sent at 03/14/2020  1:22 PM EDT ----- Regarding: gout Please call pt.. have him make an appt to discuss gout and repeat uric acid level.  ----- Message ----- From: [name], MD Sent: 03/14/2020   9:30 AM EDT To: Jinny Sanders, MD  Hi,  He has had recurrent gout x 3.  Is he an allopurinol kind of guy?  Thanks  [employer]

## 2020-03-16 DIAGNOSIS — Z008 Encounter for other general examination: Secondary | ICD-10-CM | POA: Diagnosis not present

## 2020-03-16 DIAGNOSIS — E261 Secondary hyperaldosteronism: Secondary | ICD-10-CM | POA: Diagnosis not present

## 2020-03-16 DIAGNOSIS — I4891 Unspecified atrial fibrillation: Secondary | ICD-10-CM | POA: Diagnosis not present

## 2020-03-16 DIAGNOSIS — I509 Heart failure, unspecified: Secondary | ICD-10-CM | POA: Diagnosis not present

## 2020-03-16 DIAGNOSIS — D6869 Other thrombophilia: Secondary | ICD-10-CM | POA: Diagnosis not present

## 2020-03-16 DIAGNOSIS — I25119 Atherosclerotic heart disease of native coronary artery with unspecified angina pectoris: Secondary | ICD-10-CM | POA: Diagnosis not present

## 2020-03-16 DIAGNOSIS — E785 Hyperlipidemia, unspecified: Secondary | ICD-10-CM | POA: Diagnosis not present

## 2020-03-16 DIAGNOSIS — E669 Obesity, unspecified: Secondary | ICD-10-CM | POA: Diagnosis not present

## 2020-03-16 DIAGNOSIS — I739 Peripheral vascular disease, unspecified: Secondary | ICD-10-CM | POA: Diagnosis not present

## 2020-03-16 DIAGNOSIS — R69 Illness, unspecified: Secondary | ICD-10-CM | POA: Diagnosis not present

## 2020-03-16 DIAGNOSIS — I110 Hypertensive heart disease with heart failure: Secondary | ICD-10-CM | POA: Diagnosis not present

## 2020-03-18 ENCOUNTER — Other Ambulatory Visit: Payer: Self-pay

## 2020-03-18 ENCOUNTER — Ambulatory Visit (INDEPENDENT_AMBULATORY_CARE_PROVIDER_SITE_OTHER): Payer: Medicare HMO | Admitting: Family Medicine

## 2020-03-18 ENCOUNTER — Encounter: Payer: Self-pay | Admitting: Family Medicine

## 2020-03-18 VITALS — BP 146/60 | HR 50 | Temp 97.7°F | Ht 66.5 in | Wt 217.2 lb

## 2020-03-18 DIAGNOSIS — R0989 Other specified symptoms and signs involving the circulatory and respiratory systems: Secondary | ICD-10-CM | POA: Insufficient documentation

## 2020-03-18 DIAGNOSIS — M1A09X0 Idiopathic chronic gout, multiple sites, without tophus (tophi): Secondary | ICD-10-CM

## 2020-03-18 MED ORDER — ALLOPURINOL 100 MG PO TABS: 100.0000 mg | ORAL_TABLET | Freq: Every day | ORAL | 6 refills | Status: DC

## 2020-03-18 NOTE — Progress Notes (Signed)
Patient ID: Gregory [name], male    DOB: 06/16/1950, 70 y.o.   MRN: [id number]  This visit was conducted in person.  BP (!) 146/60   Pulse (!) 50   Temp 97.7 F (36.5 C) (Temporal)   Ht 5' 6.5" (1.689 m)   Wt 217 lb 4 oz (98.5 kg)   SpO2 95%   BMI 34.54 kg/m    CC:  Chief Complaint  Patient presents with  . Gout    Subjective:   HPI: Gregory [name] is a 70 y.o. male presenting on 03/18/2020 for Gout  Gout, inadequate control.  Has occurred in both ankles.[name] swelling and pain.  Last issue was 2 weeks ago in left knee.. severe pain, swelling, no redness, some heat.  No improvement with tramadol and acetaminophen. Has tried gout out. Colchicine has not helped prevent flares.  Less red meat.. but prior to last flare had steak. Has been working o low purine diet.  He current has no pain in knees.   Recent steroid injection in bilateral knees for OA... possible future left knee replacement.   no on diuretic.   GFR 86  Lab Results  Component Value Date   LABURIC 4.0 04/15/2019         Relevant past medical, surgical, family and social history reviewed and updated as indicated. Interim medical history since our last visit reviewed. Allergies and medications reviewed and updated. Outpatient Medications Prior to Visit  Medication Sig Dispense Refill  . ALPRAZolam (XANAX) 0.5 MG tablet TAKE 1 TABLET BY MOUTH ONCE DAILY AS NEEDED FOR ANXIETY TAKE  30  MINUTES  PRIOR  TO  PLANE  FLIGHT  OR  AS  NEEDED  FOR  ANXIETY 20 tablet 0  . aspirin EC 81 MG tablet Take 1 tablet (81 mg total) by mouth daily. Swallow whole. 30 tablet 11  . atorvastatin (LIPITOR) 80 MG tablet Take 1 tablet (80 mg total) by mouth daily. 90 tablet 3  . colchicine 0.6 MG tablet Take 2 tablets by mouth once daily 60 tablet 5  . ketoconazole (NIZORAL) 2 % shampoo Apply 1 application topically 2 (two) times a week. 120 mL 0  . latanoprost (XALATAN) 0.005 % ophthalmic solution Place 1 drop into both  eyes every morning.     [name] losartan (COZAAR) 50 MG tablet Take 1 tablet (50 mg total) by mouth daily. 90 tablet 3  . metoprolol tartrate (LOPRESSOR) 25 MG tablet Take 1/2 (one-half) tablet by mouth twice daily 30 tablet 2  . Misc Natural Products (OSTEO BI-FLEX/5-LOXIN ADVANCED PO) Take 2 tablets by mouth daily.     . sertraline (ZOLOFT) 50 MG tablet Take 1 tablet (50 mg total) by mouth daily. 90 tablet 3  . traMADol (ULTRAM) 50 MG tablet TAKE 1 TABLET BY MOUTH EVERY 4 HOURS AS NEEDED FOR PAIN 30 tablet 0  . traZODone (DESYREL) 50 MG tablet Take 0.5-1 tablets (25-50 mg total) by mouth at bedtime as needed for sleep. 30 tablet 3   No facility-administered medications prior to visit.     Per HPI unless specifically indicated in ROS section below Review of Systems  Constitutional: Negative for fatigue and fever.  HENT: Negative for ear pain.   Eyes: Negative for pain.  Respiratory: Negative for cough and shortness of breath.   Cardiovascular: Negative for chest pain, palpitations and leg swelling.  Gastrointestinal: Negative for abdominal pain.  Genitourinary: Negative for dysuria.  Musculoskeletal: Negative for arthralgias.  Neurological: Negative for syncope,  light-headedness and headaches.  Psychiatric/Behavioral: Negative for dysphoric mood.   Objective:  BP (!) 146/60   Pulse (!) 50   Temp 97.7 F (36.5 C) (Temporal)   Ht 5' 6.5" (1.689 m)   Wt 217 lb 4 oz (98.5 kg)   SpO2 95%   BMI 34.54 kg/m   Wt Readings from Last 3 Encounters:  03/18/20 217 lb 4 oz (98.5 kg)  03/14/20 220 lb (99.8 kg)  12/22/19 222 lb 12.8 oz (101.1 kg)      Physical Exam Constitutional:      Appearance: He is well-developed.  HENT:     Head: Normocephalic.     Right Ear: Hearing normal.     Left Ear: Hearing normal.     Nose: Nose normal.  Neck:     Thyroid: No thyroid mass or thyromegaly.     Vascular: No carotid bruit.     Trachea: Trachea normal.  Cardiovascular:     Rate and Rhythm:  Normal rate and regular rhythm.     Pulses:          Dorsalis pedis pulses are 1+ on the left side.       Posterior tibial pulses are 1+ on the left side.     Heart sounds: Heart sounds not distant. No murmur heard. No friction rub. No gallop.      Comments: No peripheral edema Pulmonary:     Effort: Pulmonary effort is normal. No respiratory distress.     Breath sounds: Normal breath sounds.  Skin:    General: Skin is warm and dry.     Findings: No rash.  Psychiatric:        Speech: Speech normal.        Behavior: Behavior normal.        Thought Content: Thought content normal.       Results for orders placed or performed in visit on 12/22/19  CBC with Differential/Platelet  Result Value Ref Range   WBC 4.9 4.0 - 10.5 K/uL   RBC 3.38 (L) 4.22 - 5.81 Mil/uL   Hemoglobin 7.8 Repeated and verified X2. (LL) 13.0 - 17.0 g/dL   HCT 26.0 (L) 39.0 - 52.0 %   MCV 76.8 (L) 78.0 - 100.0 fl   MCHC 30.1 30.0 - 36.0 g/dL   RDW 18.2 (H) 11.5 - 15.5 %   Platelets 240.0 150.0 - 400.0 K/uL   Neutrophils Relative % 56.2 43.0 - 77.0 %   Lymphocytes Relative 31.6 12.0 - 46.0 %   Monocytes Relative 9.5 3.0 - 12.0 %   Eosinophils Relative 2.0 0.0 - 5.0 %   Basophils Relative 0.7 0.0 - 3.0 %   Neutro Abs 2.8 1.4 - 7.7 K/uL   Lymphs Abs 1.5 0.7 - 4.0 K/uL   Monocytes Absolute 0.5 0.1 - 1.0 K/uL   Eosinophils Absolute 0.1 0.0 - 0.7 K/uL   Basophils Absolute 0.0 0.0 - 0.1 K/uL  Urinalysis Dipstick  Result Value Ref Range   Color, UA Straw    Clarity, UA Clear    Glucose, UA Positive (A) Negative   Bilirubin, UA 1    Ketones, UA Negative    Spec Grav, UA >=1.030 (A) 1.010 - 1.025   Blood, UA 1+    pH, UA 5.5 5.0 - 8.0   Protein, UA Positive (A) Negative   Urobilinogen, UA 0.2 0.2 or 1.0 E.U./dL   Nitrite, UA Negative    Leukocytes, UA Negative Negative   Appearance  Odor      This visit occurred during the SARS-CoV-2 public health emergency.  Safety protocols were in place,  including screening questions prior to the visit, additional usage of staff PPE, and extensive cleaning of exam room while observing appropriate contact time as indicated for disinfecting solutions.   COVID 19 screen:  No recent travel or known exposure to COVID19 The patient denies respiratory symptoms of COVID 19 at this time. The importance of social distancing was discussed today.   Assessment and Plan    Problem List Items Addressed This Visit    Chronic gout of multiple sites - Primary    Eval uric acid.  No triggering cause other than diet .. no diuretic or renal issues.  Plan allopurinol start if uric acid elevated.  Use colchicine prn flare if able to start early.   Work on low purine diet.      Relevant Medications   allopurinol (ZYLOPRIM) 100 MG tablet   Other Relevant Orders   Uric acid   Decreased pedal pulses    [facility] screening showed possible decreased ABI in right 0.22. Given CAd history he is a higher risk for PAD.  He has cold feet, decreased pulses but equal bilaterally.   Eval with full bilateral ABIs.       Relevant Orders   VAS [place] LOWER EXT ART SEG MULTI (SEGMENTALS & LE RAYNAUDS)       [name], MD

## 2020-03-18 NOTE — Assessment & Plan Note (Signed)
[facility] screening showed possible decreased ABI in right 0.22. Given CAd history he is a higher risk for PAD.  He has cold feet, decreased pulses but equal bilaterally.   Eval with full bilateral ABIs.

## 2020-03-18 NOTE — Assessment & Plan Note (Addendum)
Eval uric acid.  No triggering cause other than diet .. no diuretic or renal issues.  Plan allopurinol start if uric acid elevated.  Use colchicine prn flare if able to start early.   Work on low purine diet.

## 2020-03-18 NOTE — Patient Instructions (Addendum)
If uric acid elevated we will start allopurinol 100 mg daily.  Stop colchicine but can use early in flare for acute treatment. Continue working on low purines in diet.  We will set up dopplers to evaluate blood flow to legs.   Low-Purine Eating Plan A low-purine eating plan involves making food choices to limit your intake of purine. Purine is a kind of uric acid. Too much uric acid in your blood can cause certain conditions, such as gout and kidney stones. Eating a low-purine diet can help control these conditions. What are tips for following this plan? Reading food labels  Avoid foods with saturated or Trans fat.  Check the ingredient list of grains-based foods, such as bread and cereal, to make sure that they contain whole grains.  Check the ingredient list of sauces or soups to make sure they do not contain meat or fish.  When choosing soft drinks, check the ingredient list to make sure they do not contain high-fructose corn syrup. Shopping  Buy plenty of fresh fruits and vegetables.  Avoid buying canned or fresh fish.  Buy dairy products labeled as low-fat or nonfat.  Avoid buying premade or processed foods. These foods are often high in fat, salt (sodium), and added sugar.   Cooking  Use olive oil instead of butter when cooking. Oils like olive oil, canola oil, and sunflower oil contain healthy fats. Meal planning  Learn which foods do or do not affect you. If you find out that a food tends to cause your gout symptoms to flare up, avoid eating that food. You can enjoy foods that do not cause problems. If you have any questions about a food item, talk with your dietitian or health care provider.  Limit foods high in fat, especially saturated fat. Fat makes it harder for your body to get rid of uric acid.  Choose foods that are lower in fat and are lean sources of protein. General guidelines  Limit alcohol intake to no more than 1 drink a day for nonpregnant women and 2  drinks a day for men. One drink equals 12 oz of beer, 5 oz of wine, or 1 oz of hard liquor. Alcohol can affect the way your body gets rid of uric acid.  Drink plenty of water to keep your urine clear or pale yellow. Fluids can help remove uric acid from your body.  If directed by your health care provider, take a vitamin C supplement.  Work with your health care provider and dietitian to develop a plan to achieve or maintain a healthy weight. Losing weight can help reduce uric acid in your blood. What foods are recommended? The items listed may not be a complete list. Talk with your dietitian about what dietary choices are best for you. Foods low in purines Foods low in purines do not need to be limited. These include:  All fruits.  All low-purine vegetables, pickles, and olives.  Breads, pasta, rice, cornbread, and popcorn. Cake and other baked goods.  All dairy foods.  Eggs, nuts, and nut butters.  Spices and condiments, such as salt, herbs, and vinegar.  Plant oils, butter, and margarine.  Water, sugar-free soft drinks, tea, coffee, and cocoa.  Vegetable-based soups, broths, sauces, and gravies. Foods moderate in purines Foods moderate in purines should be limited to the amounts listed.   cup of asparagus, cauliflower, spinach, mushrooms, or green peas, each day.  2/3 cup uncooked oatmeal, each day.   cup dry wheat bran or wheat  germ, each day.  2-3 ounces of meat or poultry, each day.  4-6 ounces of shellfish, such as crab, lobster, oysters, or shrimp, each day.  1 cup cooked beans, peas, or lentils, each day.  Soup, broths, or bouillon made from meat or fish. Limit these foods as much as possible. What foods are not recommended? The items listed may not be a complete list. Talk with your dietitian about what dietary choices are best for you. Limit your intake of foods high in purines, including:  Beer and other alcohol.  Meat-based gravy or sauce.  Canned  or fresh fish, such as: ? Anchovies, sardines, herring, and tuna. ? Mussels and scallops. ? Codfish, trout, and haddock.  Berniece Salines.  Organ meats, such as: ? Liver or kidney. ? Tripe. ? Sweetbreads (thymus gland or pancreas).  [employer] biochemist.  Yeast or yeast extract supplements.  Drinks sweetened with high-fructose corn syrup. Summary  Eating a low-purine diet can help control conditions caused by too much uric acid in the body, such as gout or kidney stones.  Choose low-purine foods, limit alcohol, and limit foods high in fat.  You will learn over time which foods do or do not affect you. If you find out that a food tends to cause your gout symptoms to flare up, avoid eating that food. This information is not intended to replace advice given to you by your health care provider. Make sure you discuss any questions you have with your health care provider. Document Revised: 04/02/2019 Document Reviewed: 04/02/2019 Elsevier Patient Education  2021 [employer].

## 2020-03-21 ENCOUNTER — Other Ambulatory Visit (INDEPENDENT_AMBULATORY_CARE_PROVIDER_SITE_OTHER): Payer: Medicare HMO

## 2020-03-21 ENCOUNTER — Other Ambulatory Visit: Payer: Medicare HMO

## 2020-03-21 ENCOUNTER — Other Ambulatory Visit: Payer: Self-pay

## 2020-03-21 DIAGNOSIS — E7800 Pure hypercholesterolemia, unspecified: Secondary | ICD-10-CM | POA: Diagnosis not present

## 2020-03-21 DIAGNOSIS — D509 Iron deficiency anemia, unspecified: Secondary | ICD-10-CM | POA: Diagnosis not present

## 2020-03-21 DIAGNOSIS — M1A09X0 Idiopathic chronic gout, multiple sites, without tophus (tophi): Secondary | ICD-10-CM | POA: Diagnosis not present

## 2020-03-21 DIAGNOSIS — E559 Vitamin D deficiency, unspecified: Secondary | ICD-10-CM

## 2020-03-21 DIAGNOSIS — R7303 Prediabetes: Secondary | ICD-10-CM

## 2020-03-21 LAB — LIPID PANEL
Cholesterol: 151 mg/dL (ref 0–200)
HDL: 67.7 mg/dL (ref >39.00)
LDL Cholesterol: 63 mg/dL (ref 0–99)
NonHDL: 82.85
Total CHOL/HDL Ratio: 2
Triglycerides: 98 mg/dL (ref 0.0–149.0)
VLDL: 19.6 mg/dL (ref 0.0–40.0)

## 2020-03-21 LAB — COMPREHENSIVE METABOLIC PANEL
ALT: 20 U/L (ref 0–53)
AST: 47 U/L — ABNORMAL HIGH (ref 0–37)
Albumin: 4.7 g/dL (ref 3.5–5.2)
Alkaline Phosphatase: 43 U/L (ref 39–117)
BUN: 25 mg/dL — ABNORMAL HIGH (ref 6–23)
CO2: 28 mEq/L (ref 19–32)
Calcium: 9.4 mg/dL (ref 8.4–10.5)
Chloride: 105 mEq/L (ref 96–112)
Creatinine, Ser: 0.87 mg/dL (ref 0.40–1.50)
GFR: 86.77 mL/min (ref >60.00)
Glucose, Bld: 109 mg/dL — ABNORMAL HIGH (ref 70–99)
Potassium: 4.6 mEq/L (ref 3.5–5.1)
Sodium: 140 mEq/L (ref 135–145)
Total Bilirubin: 1.6 mg/dL — ABNORMAL HIGH (ref 0.2–1.2)
Total Protein: 6.4 g/dL (ref 6.0–8.3)

## 2020-03-21 LAB — CBC WITH DIFFERENTIAL/PLATELET
Basophils Absolute: 0 10*3/uL (ref 0.0–0.1)
Basophils Relative: 0.4 % (ref 0.0–3.0)
Eosinophils Absolute: 0.1 10*3/uL (ref 0.0–0.7)
Eosinophils Relative: 1.8 % (ref 0.0–5.0)
HCT: 35.5 % — ABNORMAL LOW (ref 39.0–52.0)
Hemoglobin: 11.8 g/dL — ABNORMAL LOW (ref 13.0–17.0)
Lymphocytes Relative: 26.2 % (ref 12.0–46.0)
Lymphs Abs: 1.6 10*3/uL (ref 0.7–4.0)
MCHC: 33.2 g/dL (ref 30.0–36.0)
MCV: 95.9 fl (ref 78.0–100.0)
Monocytes Absolute: 0.5 10*3/uL (ref 0.1–1.0)
Monocytes Relative: 7.8 % (ref 3.0–12.0)
Neutro Abs: 3.8 10*3/uL (ref 1.4–7.7)
Neutrophils Relative %: 63.8 % (ref 43.0–77.0)
Platelets: 185 10*3/uL (ref 150.0–400.0)
RBC: 3.7 Mil/uL — ABNORMAL LOW (ref 4.22–5.81)
RDW: 19.2 % — ABNORMAL HIGH (ref 11.5–15.5)
WBC: 6 10*3/uL (ref 4.0–10.5)

## 2020-03-21 LAB — IBC + FERRITIN
Ferritin: 20.9 ng/mL — ABNORMAL LOW (ref 22.0–322.0)
Iron: 183 ug/dL — ABNORMAL HIGH (ref 42–165)
Saturation Ratios: 40.7 % (ref 20.0–50.0)
Transferrin: 321 mg/dL (ref 212.0–360.0)

## 2020-03-21 LAB — VITAMIN D 25 HYDROXY (VIT D DEFICIENCY, FRACTURES): VITD: 41.22 ng/mL (ref 30.00–100.00)

## 2020-03-21 LAB — URIC ACID: Uric Acid, Serum: 6 mg/dL (ref 4.0–7.8)

## 2020-03-22 ENCOUNTER — Other Ambulatory Visit: Payer: Self-pay | Admitting: Family Medicine

## 2020-03-22 DIAGNOSIS — D509 Iron deficiency anemia, unspecified: Secondary | ICD-10-CM

## 2020-03-22 DIAGNOSIS — M109 Gout, unspecified: Secondary | ICD-10-CM

## 2020-03-22 LAB — HEMOGLOBIN A1C
Hgb A1c MFr Bld: 4.3 % of total Hgb (ref <5.7)
Mean Plasma Glucose: 77 mg/dL
eAG (mmol/L): 4.2 mmol/L

## 2020-03-23 ENCOUNTER — Other Ambulatory Visit: Payer: Self-pay | Admitting: Physician Assistant

## 2020-03-29 ENCOUNTER — Other Ambulatory Visit: Payer: Self-pay

## 2020-03-29 ENCOUNTER — Other Ambulatory Visit: Payer: Self-pay | Admitting: Family Medicine

## 2020-03-29 ENCOUNTER — Ambulatory Visit (HOSPITAL_COMMUNITY)
Admission: RE | Admit: 2020-03-29 | Discharge: 2020-03-29 | Disposition: A | Discharge status: Home / Self Care | Payer: Medicare HMO | Source: Ambulatory Visit | Attending: Internal Medicine | Admitting: Internal Medicine

## 2020-03-29 DIAGNOSIS — R0989 Other specified symptoms and signs involving the circulatory and respiratory systems: Secondary | ICD-10-CM

## 2020-04-23 ENCOUNTER — Other Ambulatory Visit: Payer: Self-pay | Admitting: Family Medicine

## 2020-04-25 NOTE — Telephone Encounter (Signed)
Last office visit 03/18/2020 for Gout.  Last refilled 12/30/2019 for #30 with no refills.  Next Appt: 05/06/2020 for follow up Gout.

## 2020-05-06 ENCOUNTER — Other Ambulatory Visit: Payer: Self-pay

## 2020-05-06 ENCOUNTER — Ambulatory Visit (INDEPENDENT_AMBULATORY_CARE_PROVIDER_SITE_OTHER): Payer: Medicare HMO | Admitting: Family Medicine

## 2020-05-06 VITALS — BP 150/60 | HR 54 | Temp 97.0°F | Ht 66.5 in | Wt 223.0 lb

## 2020-05-06 DIAGNOSIS — R6889 Other general symptoms and signs: Secondary | ICD-10-CM | POA: Diagnosis not present

## 2020-05-06 DIAGNOSIS — M1A09X0 Idiopathic chronic gout, multiple sites, without tophus (tophi): Secondary | ICD-10-CM

## 2020-05-06 DIAGNOSIS — L219 Seborrheic dermatitis, unspecified: Secondary | ICD-10-CM

## 2020-05-06 DIAGNOSIS — I10 Essential (primary) hypertension: Secondary | ICD-10-CM | POA: Diagnosis not present

## 2020-05-06 DIAGNOSIS — D509 Iron deficiency anemia, unspecified: Secondary | ICD-10-CM | POA: Diagnosis not present

## 2020-05-06 MED ORDER — KETOCONAZOLE 2 % EX SHAM: 1.0000 | MEDICATED_SHAMPOO | CUTANEOUS | 1 refills | Status: DC

## 2020-05-06 NOTE — Progress Notes (Signed)
Patient ID: Gregory [name], male    DOB: 05/03/1950, 70 y.o.   MRN: [id number]  This visit was conducted in person.   Blood pressure (!) 150/60, pulse (!) 54, temperature (!) 97 F (36.1 C), temperature source Temporal, height 5' 6.5" (1.689 m), weight 223 lb (101.2 kg), SpO2 98 %.  CC:  Chief Complaint  Patient presents with  . Follow-up    Gout and blood work     Subjective:   HPI: Gregory [name] is a 70 y.o. male presenting on 05/06/2020 for Follow-up (Gout and blood work )   1.Gout, now on allopurinol x 6 weeks. He started the allopurinol but  had a gout attack so stopped the allopurinol and changed to colchicine.  Left knee is where flare was located... had more alcohol and food triggers the night before  Due for repeat uric acid.   2. Iron deficiency anemia... 4 month ago dropped to 7.8.Marland KitchenMarland Kitchen  unclear cause.. never returned Cologuard. No nose bleed,  easy bruising. He has been on ferrous sulfate 325 mg  once daily for the last month.  03/21/2020 increased to  11.8  Ferritin  Low at 20  transferrin: normal at 321  total iron 183  Due for re-eval.  3. Hypertension: Chronic, poor control in office today despite losartan 50 mg daily and metoprolol 12.5 mg BID BP Readings from Last 3 Encounters:  05/06/20 (!) 150/60  03/18/20 (!) 146/60  03/14/20 (!) 152/64  Using medication without problems or lightheadedness:none  Chest pain with exertion:none Edema:none Short of breath:none Average home BPs: not checking Other issues:  4.  ABIs  Abnormal per home health.. pt with dorsal foot and toe pain at rest and with walking, no clear claudication symptoms. Reviewed results from 2022 ans 2020 03/2020  Bilateral ABIs appear increased compared to prior study on 01/31/2018. Bilateral TBIs appear essentially unchanged compared to prior study on 01/31/2018. Summary: Right: Resting right ankle-brachial index indicates noncompressible right lower extremity arteries. The right  toe-brachial index is normal. Left: Resting left ankle-brachial index indicates noncompressible left lower extremity arteries. The left toebrachial index is abnormal.      5. Seborrheic dermatitis: ketoconazole helping a lot.. needs refill.  Relevant past medical, surgical, family and social history reviewed and updated as indicated. Interim medical history since our last visit reviewed. Allergies and medications reviewed and updated. Outpatient Medications Prior to Visit  Medication Sig Dispense Refill  . allopurinol (ZYLOPRIM) 100 MG tablet Take 1 tablet (100 mg total) by mouth daily. 30 tablet 6  . ALPRAZolam (XANAX) 0.5 MG tablet TAKE 1 TABLET BY MOUTH ONCE DAILY AS NEEDED FOR ANXIETY TAKE  30  MINUTES  PRIOR  TO  PLANE  FLIGHT  OR  AS  NEEDED  FOR  ANXIETY 20 tablet 0  . aspirin EC 81 MG tablet Take 1 tablet (81 mg total) by mouth daily. Swallow whole. 30 tablet 11  . atorvastatin (LIPITOR) 80 MG tablet Take 1 tablet (80 mg total) by mouth daily. 90 tablet 3  . colchicine 0.6 MG tablet Take 2 tablets by mouth once daily 60 tablet 5  . ketoconazole (NIZORAL) 2 % shampoo Apply 1 application topically 2 (two) times a week. 120 mL 0  . latanoprost (XALATAN) 0.005 % ophthalmic solution Place 1 drop into both eyes every morning.     [name] losartan (COZAAR) 50 MG tablet Take 1 tablet (50 mg total) by mouth daily. 90 tablet 3  . metoprolol tartrate (LOPRESSOR) 25  MG tablet Take 1/2 (one-half) tablet by mouth twice daily 30 tablet 2  . Misc Natural Products (OSTEO BI-FLEX/5-LOXIN ADVANCED PO) Take 2 tablets by mouth daily.     . sertraline (ZOLOFT) 50 MG tablet Take 1 tablet (50 mg total) by mouth daily. 90 tablet 3  . traMADol (ULTRAM) 50 MG tablet TAKE 1 TABLET BY MOUTH EVERY 4 HOURS AS NEEDED FOR PAIN 30 tablet 0  . traZODone (DESYREL) 50 MG tablet Take 0.5-1 tablets (25-50 mg total) by mouth at bedtime as needed for sleep. 30 tablet 3   No facility-administered medications prior to visit.      Per HPI unless specifically indicated in ROS section below Review of Systems  Constitutional: Negative for fatigue and fever.  HENT: Negative for ear pain.   Eyes: Negative for pain.  Respiratory: Negative for cough and shortness of breath.   Cardiovascular: Negative for chest pain, palpitations and leg swelling.  Gastrointestinal: Negative for abdominal pain.  Genitourinary: Negative for dysuria.  Musculoskeletal: Negative for arthralgias.  Neurological: Negative for syncope, light-headedness and headaches.  Psychiatric/Behavioral: Negative for dysphoric mood.   Objective:  There were no vitals taken for this visit.  Wt Readings from Last 3 Encounters:  03/18/20 217 lb 4 oz (98.5 kg)  03/14/20 220 lb (99.8 kg)  12/22/19 222 lb 12.8 oz (101.1 kg)      Physical Exam Constitutional:      Appearance: He is well-developed.  HENT:     Head: Normocephalic.     Right Ear: Hearing normal.     Left Ear: Hearing normal.     Nose: Nose normal.  Neck:     Thyroid: No thyroid mass or thyromegaly.     Vascular: No carotid bruit.     Trachea: Trachea normal.  Cardiovascular:     Rate and Rhythm: Normal rate and regular rhythm.     Pulses:          Dorsalis pedis pulses are 1+ on the right side and 1+ on the left side.     Heart sounds: Heart sounds not distant. Murmur heard.   Systolic murmur is present. No friction rub. No gallop.      Comments: No peripheral edema Pulmonary:     Effort: Pulmonary effort is normal. No respiratory distress.     Breath sounds: Normal breath sounds.  Musculoskeletal:     Right lower leg: No edema.     Left lower leg: No edema.  Skin:    General: Skin is warm and dry.     Findings: No rash.  Psychiatric:        Speech: Speech normal.        Behavior: Behavior normal.        Thought Content: Thought content normal.       Results for orders placed or performed in visit on 03/21/20  Hemoglobin A1c  Result Value Ref Range   Hgb A1c MFr Bld  4.3 <5.7 % of total Hgb   Mean Plasma Glucose 77 mg/dL   eAG (mmol/L) 4.2 mmol/L    This visit occurred during the SARS-CoV-2 public health emergency.  Safety protocols were in place, including screening questions prior to the visit, additional usage of staff PPE, and extensive cleaning of exam room while observing appropriate contact time as indicated for disinfecting solutions.   COVID 19 screen:  No recent travel or known exposure to COVID19 The patient denies respiratory symptoms of COVID 19 at this time. The importance of social  distancing was discussed today.   Assessment and Plan    Problem List Items Addressed This Visit    Abnormal ankle brachial index (ABI)    Refer to vascular for further recommendations.      Relevant Orders   Ambulatory referral to Vascular Surgery   Anemia     He will return cologuard given no past colon cancer screening... unclear etiology of anemia. No other clear source.   Continued iron and return for labs when uric acid level checked.      Chronic gout of multiple sites - Primary    Reviewed how to take allopurinol and  How to treat flares with colchicine.      Essential hypertension    UNcelar control.. follow at home and call with measurements.. may need increase in losartan.      Seborrheic dermatitis    Improved.. refill ketoconazole.          Gregory Lofts, MD

## 2020-05-06 NOTE — Assessment & Plan Note (Signed)
Reviewed how to take allopurinol and  How to treat flares with colchicine.

## 2020-05-06 NOTE — Assessment & Plan Note (Signed)
UNcelar control.. follow at home and call with measurements.. may need increase in losartan.

## 2020-05-06 NOTE — Patient Instructions (Addendum)
For future flare of gout.. continue the allopurinol through the flare.. treat with colchicine 2 tabs x 1 then can repeat 1 tab later that day, if needed continue colchicine  1 tab twice daily until flare gone.  Please return Cologuard as soon as able.  Check blood pressure cuff.. follow BP at home.[name] goal < 140/90.  Call in 1-2 weeks with BP measurements.   We will set up a vascular referral.

## 2020-05-06 NOTE — Assessment & Plan Note (Signed)
Improved.. refill ketoconazole.

## 2020-05-06 NOTE — Assessment & Plan Note (Signed)
Refer to vascular for further recommendations.

## 2020-05-06 NOTE — Assessment & Plan Note (Signed)
He will return cologuard given no past colon cancer screening... unclear etiology of anemia. No other clear source.   Continued iron and return for labs when uric acid level checked.

## 2020-05-09 DIAGNOSIS — M170 Bilateral primary osteoarthritis of knee: Secondary | ICD-10-CM | POA: Diagnosis not present

## 2020-05-16 ENCOUNTER — Encounter: Payer: Self-pay | Admitting: Surgery

## 2020-05-16 ENCOUNTER — Ambulatory Visit: Payer: Medicare HMO | Admitting: Surgery

## 2020-05-16 ENCOUNTER — Other Ambulatory Visit: Payer: Self-pay

## 2020-05-16 VITALS — BP 180/71 | HR 53 | Temp 98.8°F | Resp 20 | Ht 66.5 in | Wt 224.0 lb

## 2020-05-16 DIAGNOSIS — I739 Peripheral vascular disease, unspecified: Secondary | ICD-10-CM

## 2020-05-16 NOTE — Progress Notes (Signed)
[facility]  Patient name: Gregory [name] MRN: [id number] DOB: 1950/10/06 Sex: male   REQUESTING PROVIDER:    Dr. Diona Browner PAD   REASON FOR CONSULT:    PAD  HISTORY OF PRESENT ILLNESS:   Gregory [name] is a 70 y.o. male, who is referred for evaluation of abnormal ABIs.  The patient has recently undergone CABG/AVR.[name]  He developed HIT postoperatively.  He was discharged on Coumadin.  Pre-CABG Doppler showed no significant carotid stenosis.  He did have an abnormal toe brachial index and calcified tibial vessels.  He denies symptoms of claudication.  He will occasionally get gout in his big toe.  He has been told he was borderline diabetic.  He is medically managed for hypertension.  He takes a statin for hypercholesterolemia.  He is a non-smoker.  PAST MEDICAL HISTORY    Past Medical History:  Diagnosis Date  . Alcohol abuse   . Aortic valve disease    AI and AS, Moderate  . Chest pain 03/2019  . Coronary artery disease   . Drug use   . Edema of both lower extremities   . Glaucoma   . Hypertension   . OSA (obstructive sleep apnea)   . Shingles      FAMILY HISTORY   Family History  Adopted: Yes  Problem Relation Age of Onset  . Cancer Mother        breast cancer  . Alcohol abuse Father   . Breast cancer Sister     SOCIAL HISTORY:   Social History   Socioeconomic History  . Marital status: Married    Spouse name: Cindi  . Number of children: 3  . Years of education: Not on file  . Highest education level: Not on file  Occupational History  . Occupation: Architect  Tobacco Use  . Smoking status: Never Smoker  . Smokeless tobacco: Never Used  Vaping Use  . Vaping Use: Some days  . Substances: Mixture of cannabinoids  Substance and Sexual Activity  . Alcohol use: Yes    Alcohol/week: 12.0 standard drinks    Types: 12 Cans of beer per week    Comment: weekly  . Drug use: Yes     Types: Marijuana  . Sexual activity: Not on file  Other Topics Concern  . Not on file  Social History Narrative   Lives at home with wife.   Social Determinants of Health   Financial Resource Strain: Low Risk   . Difficulty of Paying Living Expenses: Not hard at all  Food Insecurity: No Food Insecurity  . Worried About Charity fundraiser in the Last Year: Never true  . Ran Out of Food in the Last Year: Never true  Transportation Needs: No Transportation Needs  . Lack of Transportation (Medical): No  . Lack of Transportation (Non-Medical): No  Physical Activity: Inactive  . Days of Exercise per Week: 0 days  . Minutes of Exercise per Session: 0 min  Stress: Stress Concern Present  . Feeling of Stress : To some extent  Social Connections: Not on file  Intimate Partner Violence: Not At Risk  . Fear of Current or Ex-Partner: No  . Emotionally Abused: No  . Physically Abused: No  . Sexually Abused: No    ALLERGIES:    Allergies  Allergen Reactions  . Heparin     HIT antibody and SRA positive    CURRENT MEDICATIONS:    Current Outpatient Medications  Medication Sig  Dispense Refill  . allopurinol (ZYLOPRIM) 100 MG tablet Take 1 tablet (100 mg total) by mouth daily. 30 tablet 6  . ALPRAZolam (XANAX) 0.5 MG tablet TAKE 1 TABLET BY MOUTH ONCE DAILY AS NEEDED FOR ANXIETY TAKE  30  MINUTES  PRIOR  TO  PLANE  FLIGHT  OR  AS  NEEDED  FOR  ANXIETY 20 tablet 0  . aspirin EC 81 MG tablet Take 1 tablet (81 mg total) by mouth daily. Swallow whole. 30 tablet 11  . atorvastatin (LIPITOR) 80 MG tablet Take 1 tablet (80 mg total) by mouth daily. 90 tablet 3  . colchicine 0.6 MG tablet Take 2 tablets by mouth once daily 60 tablet 5  . ketoconazole (NIZORAL) 2 % shampoo Apply 1 application topically 2 (two) times a week. 120 mL 1  . latanoprost (XALATAN) 0.005 % ophthalmic solution Place 1 drop into both eyes every morning.     [name] losartan (COZAAR) 50 MG tablet Take 1 tablet (50 mg total)  by mouth daily. 90 tablet 3  . metoprolol tartrate (LOPRESSOR) 25 MG tablet Take 1/2 (one-half) tablet by mouth twice daily 30 tablet 2  . Misc Natural Products (OSTEO BI-FLEX/5-LOXIN ADVANCED PO) Take 2 tablets by mouth daily.     . sertraline (ZOLOFT) 50 MG tablet Take 1 tablet (50 mg total) by mouth daily. 90 tablet 3  . traMADol (ULTRAM) 50 MG tablet TAKE 1 TABLET BY MOUTH EVERY 4 HOURS AS NEEDED FOR PAIN 30 tablet 0  . traZODone (DESYREL) 50 MG tablet Take 0.5-1 tablets (25-50 mg total) by mouth at bedtime as needed for sleep. 30 tablet 3   No current facility-administered medications for this visit.    REVIEW OF SYSTEMS:   [X]  denotes positive finding, [ ]  denotes negative finding Cardiac  Comments:  Chest pain or chest pressure:    Shortness of breath upon exertion:    Short of breath when lying flat:    Irregular heart rhythm:        Vascular    Pain in calf, thigh, or hip brought on by ambulation:    Pain in feet at night that wakes you up from your sleep:     Blood clot in your veins:    Leg swelling:         Pulmonary    Oxygen at home:    Productive cough:     Wheezing:         Neurologic    Sudden weakness in arms or legs:     Sudden numbness in arms or legs:     Sudden onset of difficulty speaking or slurred speech:    Temporary loss of vision in one eye:     Problems with dizziness:         Gastrointestinal    Blood in stool:      Vomited blood:         Genitourinary    Burning when urinating:     Blood in urine:        Psychiatric    Major depression:         Hematologic    Bleeding problems:    Problems with blood clotting too easily:        Skin    Rashes or ulcers:        Constitutional    Fever or chills:     PHYSICAL EXAM:   Vitals:   05/16/20 1038  BP: (!) 180/71  Pulse: (!) 53  Resp: 20  Temp: 98.8 F (37.1 C)  SpO2: 95%  Weight: 224 lb (101.6 kg)  Height: 5' 6.5" (1.689 m)    GENERAL: The patient is a well-nourished  male, in no acute distress. The vital signs are documented above. CARDIAC: There is a regular rate and rhythm.  VASCULAR: Palpable dorsalis pedis pulse bilaterally PULMONARY: Nonlabored respirations ABDOMEN: Soft and non-tender.  No pulsatile mass MUSCULOSKELETAL: There are no major deformities or cyanosis. NEUROLOGIC: No focal weakness or paresthesias are detected. SKIN: There are no ulcers or rashes noted. PSYCHIATRIC: The patient has a normal affect.  STUDIES:   I have reviewed the following ultrasound:  Right: Resting right ankle-brachial index indicates noncompressible right  lower extremity arteries. The right toe-brachial index is normal.   Left: Resting left ankle-brachial index indicates noncompressible left  lower extremity arteries. The left toe-brachial index is abnormal.    Left toe pressure is 110 Right toe pressure is 124 Tibial waveforms are triphasic ASSESSMENT and PLAN   PAD: The patient has palpable pedal pulses, triphasic waveforms and normal toe pressures.  His left TBI was slightly abnormal.  I do not think he needs to have this further evaluated.  We talked about how to palpate his dorsalis pedis pulse, and that he should check for this each time he is diagnosed with gout, to make sure that that is the correct diagnosis.  Abdominal aorta: We discussed getting a abdominal ultrasound to rule out aneurysmal disease.   Leia Alf, MD, FACS [facility] of [facility] [phone] Pager [phone]

## 2020-05-27 ENCOUNTER — Other Ambulatory Visit: Payer: Self-pay

## 2020-05-27 ENCOUNTER — Other Ambulatory Visit (INDEPENDENT_AMBULATORY_CARE_PROVIDER_SITE_OTHER): Payer: Medicare HMO

## 2020-05-27 DIAGNOSIS — M109 Gout, unspecified: Secondary | ICD-10-CM

## 2020-05-27 DIAGNOSIS — D509 Iron deficiency anemia, unspecified: Secondary | ICD-10-CM

## 2020-05-27 LAB — IBC + FERRITIN
Ferritin: 25.3 ng/mL (ref 22.0–322.0)
Iron: 229 ug/dL — ABNORMAL HIGH (ref 42–165)
Saturation Ratios: 54.5 % — ABNORMAL HIGH (ref 20.0–50.0)
Transferrin: 300 mg/dL (ref 212.0–360.0)

## 2020-05-27 LAB — URIC ACID: Uric Acid, Serum: 4.6 mg/dL (ref 4.0–7.8)

## 2020-06-01 DIAGNOSIS — H401133 Primary open-angle glaucoma, bilateral, severe stage: Secondary | ICD-10-CM | POA: Diagnosis not present

## 2020-06-06 DIAGNOSIS — M170 Bilateral primary osteoarthritis of knee: Secondary | ICD-10-CM | POA: Diagnosis not present

## 2020-06-06 DIAGNOSIS — M1711 Unilateral primary osteoarthritis, right knee: Secondary | ICD-10-CM | POA: Diagnosis not present

## 2020-06-13 DIAGNOSIS — M1711 Unilateral primary osteoarthritis, right knee: Secondary | ICD-10-CM | POA: Diagnosis not present

## 2020-06-13 DIAGNOSIS — M170 Bilateral primary osteoarthritis of knee: Secondary | ICD-10-CM | POA: Diagnosis not present

## 2020-06-20 DIAGNOSIS — M170 Bilateral primary osteoarthritis of knee: Secondary | ICD-10-CM | POA: Diagnosis not present

## 2020-07-11 DIAGNOSIS — M1712 Unilateral primary osteoarthritis, left knee: Secondary | ICD-10-CM | POA: Diagnosis not present

## 2020-07-18 ENCOUNTER — Other Ambulatory Visit: Payer: Self-pay

## 2020-07-18 ENCOUNTER — Telehealth: Payer: Self-pay | Admitting: Family Medicine

## 2020-07-18 DIAGNOSIS — I739 Peripheral vascular disease, unspecified: Secondary | ICD-10-CM

## 2020-07-18 NOTE — Telephone Encounter (Signed)
Pt got cut on his ankle by a rusty nail yesterday and is wanting to come in an get a tetanus shot. Tried to schedule pt for an appt to be seen tomorrow. Pt wife stated they just wanted to come in for the shot

## 2020-07-18 NOTE — Telephone Encounter (Signed)
Spoke to patient by telephone and was advised that he has neosporin, band aid and support hose on his foot. Patient stated that he is at work and it will take time to remove the items to look at his foot. Advised patient that he needs to look at his foot to see if it is red or warm to the touch. Patient stated that it will take him a few minutes to look at his foot and requested a call back in about 30 minutes.

## 2020-07-18 NOTE — Telephone Encounter (Signed)
Spoke to patient and was advised that he has checked his ankle and it is not red or warm to the touch at this time. Patient stated that he wants to make sure that his ankle does not get infected and have his tetanus updated if needed.  Patient scheduled for an office visit tomorrow 07/19/20 at 10:40 with Dr. Diona Browner. Patient was given ER/UC precautions and he verbalized understanding.

## 2020-07-19 ENCOUNTER — Encounter: Payer: Self-pay | Admitting: Family Medicine

## 2020-07-19 ENCOUNTER — Ambulatory Visit (INDEPENDENT_AMBULATORY_CARE_PROVIDER_SITE_OTHER): Payer: Medicare HMO | Admitting: Family Medicine

## 2020-07-19 ENCOUNTER — Other Ambulatory Visit: Payer: Self-pay

## 2020-07-19 VITALS — BP 130/60 | HR 60 | Temp 97.3°F | Ht 66.5 in | Wt 231.5 lb

## 2020-07-19 DIAGNOSIS — S81812A Laceration without foreign body, left lower leg, initial encounter: Secondary | ICD-10-CM | POA: Diagnosis not present

## 2020-07-19 MED ORDER — CEPHALEXIN 500 MG PO CAPS
500.0000 mg | ORAL_CAPSULE | Freq: Three times a day (TID) | ORAL | 0 refills | Status: DC
Start: 2020-07-19 — End: 2020-09-12

## 2020-07-19 NOTE — Progress Notes (Signed)
Patient ID: TYRIE PORZIO, male    DOB: January 31, 1950, 70 y.o.   MRN: [id number]  This visit was conducted in person.  BP 130/60   Pulse 60   Temp (!) 97.3 F (36.3 C) (Temporal)   Ht 5' 6.5" (1.689 m)   Wt 231 lb 8 oz (105 kg)   SpO2 98%   BMI 36.81 kg/m    CC: Chief Complaint  Patient presents with   Cut by Rusty Nail on Sunday    Outside Left Ankle Area-Last Tdap 02/25/2012    Subjective:   HPI: DANTE ROUDEBUSH is a 70 y.o. male presenting on 07/19/2020 for Cut by Rusty Nail on Sunday (Outside Left Ankle Area-Last Tdap 02/25/2012)  He has received > 3 Td doses in life. Most recent in 02/2012...  no further indicated.  On 7/17.. cut left lateral ankle on rusty metal 50 gallon drum.  Area on left lateral ankle is sore.  No discharge.  No flu like symptoms and no fever.   History of lymphedema on leg.. wearing compression hose. Prediabetes.   Has been applying neosporin and bandage.       Relevant past medical, surgical, family and social history reviewed and updated as indicated. Interim medical history since our last visit reviewed. Allergies and medications reviewed and updated. Outpatient Medications Prior to Visit  Medication Sig Dispense Refill   allopurinol (ZYLOPRIM) 100 MG tablet Take 1 tablet (100 mg total) by mouth daily. 30 tablet 6   ALPRAZolam (XANAX) 0.5 MG tablet TAKE 1 TABLET BY MOUTH ONCE DAILY AS NEEDED FOR ANXIETY TAKE  30  MINUTES  PRIOR  TO  PLANE  FLIGHT  OR  AS  NEEDED  FOR  ANXIETY 20 tablet 0   aspirin EC 81 MG tablet Take 1 tablet (81 mg total) by mouth daily. Swallow whole. 30 tablet 11   atorvastatin (LIPITOR) 80 MG tablet Take 1 tablet (80 mg total) by mouth daily. 90 tablet 3   colchicine 0.6 MG tablet Take 2 tablets by mouth once daily 60 tablet 5   ketoconazole (NIZORAL) 2 % shampoo Apply 1 application topically 2 (two) times a week. 120 mL 1   latanoprost (XALATAN) 0.005 % ophthalmic solution Place 1 drop into both eyes every  morning.      metoprolol tartrate (LOPRESSOR) 25 MG tablet Take 1/2 (one-half) tablet by mouth twice daily 30 tablet 2   Misc Natural Products (OSTEO BI-FLEX/5-LOXIN ADVANCED PO) Take 2 tablets by mouth daily.      sertraline (ZOLOFT) 50 MG tablet Take 1 tablet (50 mg total) by mouth daily. 90 tablet 3   traMADol (ULTRAM) 50 MG tablet TAKE 1 TABLET BY MOUTH EVERY 4 HOURS AS NEEDED FOR PAIN 30 tablet 0   traZODone (DESYREL) 50 MG tablet Take 0.5-1 tablets (25-50 mg total) by mouth at bedtime as needed for sleep. 30 tablet 3   losartan (COZAAR) 50 MG tablet Take 1 tablet (50 mg total) by mouth daily. 90 tablet 3   No facility-administered medications prior to visit.     Per HPI unless specifically indicated in ROS section below Review of Systems  Constitutional:  Negative for fatigue and fever.  HENT:  Negative for ear pain.   Eyes:  Negative for pain.  Respiratory:  Negative for cough and shortness of breath.   Cardiovascular:  Negative for chest pain, palpitations and leg swelling.  Gastrointestinal:  Negative for abdominal pain.  Genitourinary:  Negative for dysuria.  Musculoskeletal:  Negative for arthralgias.  Neurological:  Negative for syncope, light-headedness and headaches.  Psychiatric/Behavioral:  Negative for dysphoric mood.   Objective:  BP 130/60   Pulse 60   Temp (!) 97.3 F (36.3 C) (Temporal)   Ht 5' 6.5" (1.689 m)   Wt 231 lb 8 oz (105 kg)   SpO2 98%   BMI 36.81 kg/m   Wt Readings from Last 3 Encounters:  07/19/20 231 lb 8 oz (105 kg)  05/16/20 224 lb (101.6 kg)  05/06/20 223 lb (101.2 kg)      Physical Exam Constitutional:      Appearance: He is well-developed.  HENT:     Head: Normocephalic.     Right Ear: Hearing normal.     Left Ear: Hearing normal.     Nose: Nose normal.  Neck:     Thyroid: No thyroid mass or thyromegaly.     Vascular: No carotid bruit.     Trachea: Trachea normal.  Cardiovascular:     Rate and Rhythm: Normal rate and regular  rhythm.     Pulses: Normal pulses.     Heart sounds: Heart sounds not distant. Murmur heard.  Systolic murmur is present.    No friction rub. No gallop.     Comments: No peripheral edema Pulmonary:     Effort: Pulmonary effort is normal. No respiratory distress.     Breath sounds: Normal breath sounds.  Skin:    General: Skin is warm and dry.     Findings: Lesion present.     Comments: 5 cm laertion, slight increase warmth surrounding, no erythema. See picture.  Psychiatric:        Speech: Speech normal.        Behavior: Behavior normal.        Thought Content: Thought content normal.       Results for orders placed or performed in visit on 05/27/20  IBC + Ferritin  Result Value Ref Range   Iron 229 (H) 42 - 165 ug/dL   Transferrin 300.0 212.0 - 360.0 mg/dL   Saturation Ratios 54.5 (H) 20.0 - 50.0 %   Ferritin 25.3 22.0 - 322.0 ng/mL  Uric acid  Result Value Ref Range   Uric Acid, Serum 4.6 4.0 - 7.8 mg/dL    This visit occurred during the SARS-CoV-2 public health emergency.  Safety protocols were in place, including screening questions prior to the visit, additional usage of staff PPE, and extensive cleaning of exam room while observing appropriate contact time as indicated for disinfecting solutions.   COVID 19 screen:  No recent travel or known exposure to COVID19 The patient denies respiratory symptoms of COVID 19 at this time. The importance of social distancing was discussed today.   Assessment and Plan    Problem List Items Addressed This Visit     Laceration of left lower extremity - Primary    He has received > 3 Td doses in life. Most recent in 02/2012...  no further indicated.  Will treat with prophylactic keflex course of 5 days TID given history of lymphedema and location of wound in lower leg.  ER precautions reviewed.       Meds ordered this encounter  Medications   cephALEXin (KEFLEX) 500 MG capsule    Sig: Take 1 capsule (500 mg total) by mouth  3 (three) times daily.    Dispense:  15 capsule    Refill:  0     [name], MD

## 2020-07-19 NOTE — Patient Instructions (Signed)
Complete antibiotics. Keep leg elevated.  Hold compression hose for at least 2 days into treatment.  Call if redness is spreading, increase pain, fever or if not tolerating antibiotics.

## 2020-07-19 NOTE — Assessment & Plan Note (Signed)
He has received > 3 Td doses in life. Most recent in 02/2012...  no further indicated.  Will treat with prophylactic keflex course of 5 days TID given history of lymphedema and location of wound in lower leg.  ER precautions reviewed.

## 2020-07-27 ENCOUNTER — Ambulatory Visit (HOSPITAL_COMMUNITY)
Admission: RE | Admit: 2020-07-27 | Discharge: 2020-07-27 | Disposition: A | Discharge status: Home / Self Care | Payer: Medicare HMO | Source: Ambulatory Visit | Attending: Vascular Surgery | Admitting: Vascular Surgery

## 2020-07-27 ENCOUNTER — Encounter: Payer: Self-pay | Admitting: Vascular Surgery

## 2020-07-27 ENCOUNTER — Ambulatory Visit: Payer: Medicare HMO | Admitting: Vascular Surgery

## 2020-07-27 ENCOUNTER — Other Ambulatory Visit: Payer: Self-pay

## 2020-07-27 VITALS — BP 163/78 | HR 52 | Temp 98.1°F | Resp 20 | Ht 66.5 in | Wt 233.0 lb

## 2020-07-27 DIAGNOSIS — I872 Venous insufficiency (chronic) (peripheral): Secondary | ICD-10-CM

## 2020-07-27 DIAGNOSIS — I739 Peripheral vascular disease, unspecified: Secondary | ICD-10-CM

## 2020-07-27 NOTE — Progress Notes (Signed)
Patient name: Gregory [name] MRN: [id number] DOB: September 19, 1950 Sex: male  REASON FOR VISIT:   Preoperative evaluation prior to knee replacement.  The consult is requested by Cherlynn June, PA  HPI:   Gregory [name] is a pleasant 70 y.o. male who was previously been seen by Dr. Trula Slade.  The patient was seen on 05/16/2020 with abnormal ABIs.  He had recently undergone coronary revascularization and aortic valve replacement.  He developed heparin-induced thrombocytopenia postoperatively.  He did have abnormal toe brachial indices and calcified tibial vessels.  The patient denies any history of claudication or rest pain.  His plan is to have him left knee replaced and then have the right knee replaced subsequent to that.  He does have a history of some lymphedema he states.  He injured his leg many years ago and had a wound that took some time to heal.  He had some mild lymphedema in the right leg after that.  He denies any previous history of DVT.  Current Outpatient Medications  Medication Sig Dispense Refill   allopurinol (ZYLOPRIM) 100 MG tablet Take 1 tablet (100 mg total) by mouth daily. 30 tablet 6   ALPRAZolam (XANAX) 0.5 MG tablet TAKE 1 TABLET BY MOUTH ONCE DAILY AS NEEDED FOR ANXIETY TAKE  30  MINUTES  PRIOR  TO  PLANE  FLIGHT  OR  AS  NEEDED  FOR  ANXIETY 20 tablet 0   aspirin EC 81 MG tablet Take 1 tablet (81 mg total) by mouth daily. Swallow whole. 30 tablet 11   atorvastatin (LIPITOR) 80 MG tablet Take 1 tablet (80 mg total) by mouth daily. 90 tablet 3   cephALEXin (KEFLEX) 500 MG capsule Take 1 capsule (500 mg total) by mouth 3 (three) times daily. 15 capsule 0   colchicine 0.6 MG tablet Take 2 tablets by mouth once daily 60 tablet 5   ketoconazole (NIZORAL) 2 % shampoo Apply 1 application topically 2 (two) times a week. 120 mL 1   latanoprost (XALATAN) 0.005 % ophthalmic solution Place 1 drop into both eyes every morning.      metoprolol tartrate (LOPRESSOR) 25 MG tablet  Take 1/2 (one-half) tablet by mouth twice daily 30 tablet 2   Misc Natural Products (OSTEO BI-FLEX/5-LOXIN ADVANCED PO) Take 2 tablets by mouth daily.      sertraline (ZOLOFT) 50 MG tablet Take 1 tablet (50 mg total) by mouth daily. 90 tablet 3   traMADol (ULTRAM) 50 MG tablet TAKE 1 TABLET BY MOUTH EVERY 4 HOURS AS NEEDED FOR PAIN 30 tablet 0   traZODone (DESYREL) 50 MG tablet Take 0.5-1 tablets (25-50 mg total) by mouth at bedtime as needed for sleep. 30 tablet 3   losartan (COZAAR) 50 MG tablet Take 1 tablet (50 mg total) by mouth daily. 90 tablet 3   No current facility-administered medications for this visit.    REVIEW OF SYSTEMS:  '[X]'$  denotes positive finding, '[ ]'$  denotes negative finding Vascular    Leg swelling    Cardiac    Chest pain or chest pressure:    Shortness of breath upon exertion:    Short of breath when lying flat:    Irregular heart rhythm:    Constitutional    Fever or chills:     PHYSICAL EXAM:   Vitals:   07/27/20 0831  BP: (!) 163/78  Pulse: (!) 52  Resp: 20  Temp: 98.1 F (36.7 C)  SpO2: 94%  Weight: 233 lb (105.7 kg)  Height:  5' 6.5" (1.689 m)    GENERAL: The patient is a well-nourished male, in no acute distress. The vital signs are documented above. CARDIOVASCULAR: There is a regular rate and rhythm.  He has a systolic ejection murmur. PULMONARY: There is good air exchange bilaterally without wheezing or rales. VASCULAR: His systolic murmur is transmitted to his carotids. He has palpable femoral, popliteal, dorsalis pedis, and posterior tibial pulses bilaterally. He has mild bilateral lower extremity swelling and also some hyperpigmentation consistent with chronic venous insufficiency.  DATA:   ARTERIAL DOPPLER STUDY: I have independently interpreted his arterial Doppler study today.  On the right side there is a triphasic dorsalis pedis and posterior tibial signal.  ABI is greater than 100%.  Toe pressures 125 mmHg.  On the left side  there is a triphasic dorsalis pedis and posterior tibial signal.  ABIs greater than 100%.  Toe pressure is 136 mmHg.  MEDICAL ISSUES:   PERIPHERAL VASCULAR DISEASE: The patient does have calcified vessels which make the ABIs unreliable.  However the patient has a palpable pedal pulses, triphasic dorsalis pedis and posterior tibial signal bilaterally and has normal toe pressures.  Thus I do not see any contraindications to his knee replacement from a vascular standpoint.  CHRONIC VENOUS INSUFFICIENCY: His physical exam does suggest some underlying chronic venous insufficiency.  He will need DVT prophylaxis.  He does have a history of heparin-induced thrombocytopenia so he will need early ambulation, leg elevation, and SCDs.  Gregory [name] [facility] [phone]

## 2020-08-02 ENCOUNTER — Telehealth: Payer: Self-pay

## 2020-08-02 NOTE — Telephone Encounter (Signed)
Pt s wife said pt tested positive on 07/30/20 for covid and has had heart surgery and started with symptoms on 07/29/20; pt has dry cough with SOB. Last night had difficulty breathing at times and pt could not sleep well. Pt will go to [facility] now for eval.sending note to Dr Diona Browner as PCP who is out of office, Butch Penny CMA and Dr Damita Dunnings who is in office and I just sent a note to DR Damita Dunnings about this pts wife also.pt has been quarantining will try to drink plenty of water and rest and tylenol for fever.

## 2020-08-02 NOTE — Telephone Encounter (Signed)
Agree with eval.  Thanks.

## 2020-08-17 ENCOUNTER — Telehealth: Payer: Self-pay | Admitting: Cardiology

## 2020-08-17 ENCOUNTER — Telehealth: Payer: Self-pay | Admitting: *Deleted

## 2020-08-17 NOTE — Telephone Encounter (Signed)
Received surgical clearance form and placed in Dr. Rometta Emery office in box to complete.

## 2020-08-17 NOTE — Telephone Encounter (Signed)
Patient's wife left a voicemail stating that she was following up on paperwork that was sent in for surgical clearance for her husband. Gregory [name] stated that paperwork should have been received from Dr. Lyla Glassing regarding her husband's knee surgery.  Gregory [name] wants to make sure that it was received and if anything is needed to get this taken care of. Gregory [name] stated that her husband has recently been in to see Dr. Diona Browner.

## 2020-08-17 NOTE — Telephone Encounter (Signed)
If it is in my inbox I will complete forms when I am back on Thursday.

## 2020-08-17 NOTE — Telephone Encounter (Signed)
Pts wife calling in to check the status of pt clearance... please advise

## 2020-08-17 NOTE — Telephone Encounter (Signed)
Called patient, pt reports he is having a total left knee replacement and was wanting to check on clearance for the procedure. Nurse reported to pt that there was a clearance form entered 03/14/2020 for a total knee replacement, pt reports the procedure had to be rescheduled a few times due to death in the family. Nurse advised although the clearance is completed, pt's message would be sent to Dr. Percival Spanish for review to ensure a new clearance form would not have to be completed. Pt verbalized understanding, all questions/concerns addressed at this time.

## 2020-08-17 NOTE — Telephone Encounter (Signed)
Attempted to call patient, no answer. Left nonspecific message for pt to return office call.

## 2020-08-17 NOTE — Telephone Encounter (Signed)
Per Dr. Percival Spanish: a new clearance is not needed.  I will remove from preop pool.

## 2020-08-17 NOTE — Telephone Encounter (Signed)
Spoke with Jenny Reichmann and let her know that I do not see any surgical clearance forms for Ned.  I ask that she call the surgeons office and ask them to fax them directly to me at [phone].

## 2020-08-18 NOTE — Telephone Encounter (Signed)
Pt was cleared for surgery by Dr., Percival Spanish in 03/2020 but surgery rescheduled. Cardiology does not think he needs reassessment.   Labs from 03/2020 were good... he may need repeat labs prior to surgery per ortho given rescheduled surgery ( I am not sure if this is already arranged, we can set up at our lab if needed).  No OV needed with me.. form completed for preop clearance.  Form in Donna's box in my office.

## 2020-08-18 NOTE — Telephone Encounter (Signed)
Surgical Clearance form faxed to [facility] at [phone].

## 2020-08-19 NOTE — Telephone Encounter (Signed)
Attempted again to call patient, reached pt's wife Cindi (ok per DPR). Relayed the following from [place] and Dr. Percival Spanish:  [facility], Tami Lin, [place]     2:09 PM Note Per Dr. Percival Spanish: a new clearance is not needed.   I will remove from preop pool.      Pt's wife verbalized understanding, all questions/concerns addressed at this time.

## 2020-08-22 ENCOUNTER — Ambulatory Visit: Payer: Self-pay | Admitting: Student

## 2020-08-30 ENCOUNTER — Ambulatory Visit: Payer: Self-pay | Admitting: Student

## 2020-08-30 NOTE — H&P (Signed)
TOTAL KNEE ADMISSION H&P  Patient is being admitted for left total knee arthroplasty.  Subjective:  Chief Complaint:left knee pain.  HPI: Gregory [name], 70 y.o. male, has a history of pain and functional disability in the left knee due to arthritis and has failed non-surgical conservative treatments for greater than 12 weeks to includeNSAID's and/or analgesics, corticosteriod injections, and activity modification.  Onset of symptoms was gradual, starting 3 years ago with gradually worsening course since that time. The patient noted no past surgery on the left knee(s).  Patient currently rates pain in the left knee(s) at 9 out of 10 with activity. Patient has worsening of pain with activity and weight bearing, pain that interferes with activities of daily living, and pain with passive range of motion.  Patient has evidence of subchondral cysts, subchondral sclerosis, and joint space narrowing by imaging studies. There is no active infection.  Patient Active Problem List   Diagnosis Date Noted   Laceration of left lower extremity 07/19/2020   Abnormal ankle brachial index (ABI) 05/06/2020   Decreased pedal pulses 03/18/2020   Chronic gout of multiple sites 03/18/2020   Dilated cardiomyopathy ([place]) 03/13/2020   Dyslipidemia 03/13/2020   Coronary artery disease involving native coronary artery of native heart without angina pectoris 12/12/2019   Pain due to onychomycosis of toenails of both feet 04/23/2019   Coagulation disorder ([place]) 04/23/2019   Right foot pain 04/15/2019   S/P CABG x 3 04/15/2019   GAD (generalized anxiety disorder) 03/31/2019   Seborrheic dermatitis 03/31/2019   HIT (heparin-induced thrombocytopenia) ([place]) 03/23/2019   Long term (current) use of anticoagulants 03/23/2019   Atrial fibrillation ([place]) 03/23/2019   S/P aortic valve replacement 03/06/2019   NSTEMI (non-ST elevated myocardial infarction) ([place]) 03/04/2019   Prediabetes 11/18/2018   Vitamin D deficiency  09/30/2018   Lymphedema 01/13/2018   Aortic valve stenosis 01/13/2018   Grade I diastolic dysfunction [id number]   Situational anxiety 10/30/2016   Acute left-sided low back pain without sciatica 04/17/2016   High cholesterol 01/12/2016   Bilateral primary osteoarthritis of knee 09/23/2015   Anemia 08/06/2013   Class 1 drug-induced obesity with serious comorbidity and body mass index (BMI) of 34.0 to 34.9 in adult 03/26/2011   Aortic insufficiency 02/15/2011   Heart murmur 01/03/2011   Essential hypertension 10/09/2006   Past Medical History:  Diagnosis Date   Alcohol abuse    Aortic valve disease    AI and AS, Moderate   Chest pain 03/2019   Coronary artery disease    Drug use    Edema of both lower extremities    Glaucoma    Hypertension    OSA (obstructive sleep apnea)    Shingles     Past Surgical History:  Procedure Laterality Date   AORTIC VALVE REPLACEMENT N/A 03/06/2019   Procedure: AORTIC VALVE REPLACEMENT (AVR), USING INTUITY 25MM;  Surgeon: Wonda Olds, MD;  Location: [place];  Service: Open Heart Surgery;  Laterality: N/A;   CARDIAC CATHETERIZATION     CATARACT EXTRACTION     CORONARY ARTERY BYPASS GRAFT N/A 03/06/2019   Procedure: CORONARY ARTERY BYPASS GRAFTING (CABG), ON PUMP, TIMES THREE, USING BILATERAL INTERNAL MAMMARIES AND LEFT RADIAL ARTERY HARVEST;  Surgeon: Wonda Olds, MD;  Location: [place];  Service: Open Heart Surgery;  Laterality: N/A;  BILATERAL IMA   LEFT HEART CATH AND CORONARY ANGIOGRAPHY N/A 03/04/2019   Procedure: LEFT HEART CATH AND CORONARY ANGIOGRAPHY;  Surgeon: Burnell Blanks, MD;  Location: [facility]  [facility];  Service: Cardiovascular;  Laterality: N/A;   None     RADIAL ARTERY HARVEST Left 03/06/2019   Procedure: RADIAL ARTERY HARVEST;  Surgeon: Wonda Olds, MD;  Location: [place];  Service: Open Heart Surgery;  Laterality: Left;   TEE WITHOUT CARDIOVERSION N/A 03/06/2019   Procedure: TRANSESOPHAGEAL ECHOCARDIOGRAM (TEE);   Surgeon: Wonda Olds, MD;  Location: [place];  Service: Open Heart Surgery;  Laterality: N/A;    Current Outpatient Medications  Medication Sig Dispense Refill Last Dose   allopurinol (ZYLOPRIM) 100 MG tablet Take 1 tablet (100 mg total) by mouth daily. 30 tablet 6    ALPRAZolam (XANAX) 0.5 MG tablet TAKE 1 TABLET BY MOUTH ONCE DAILY AS NEEDED FOR ANXIETY TAKE  30  MINUTES  PRIOR  TO  PLANE  FLIGHT  OR  AS  NEEDED  FOR  ANXIETY 20 tablet 0    aspirin EC 81 MG tablet Take 1 tablet (81 mg total) by mouth daily. Swallow whole. 30 tablet 11    atorvastatin (LIPITOR) 80 MG tablet Take 1 tablet (80 mg total) by mouth daily. 90 tablet 3    cephALEXin (KEFLEX) 500 MG capsule Take 1 capsule (500 mg total) by mouth 3 (three) times daily. 15 capsule 0    colchicine 0.6 MG tablet Take 2 tablets by mouth once daily 60 tablet 5    ketoconazole (NIZORAL) 2 % shampoo Apply 1 application topically 2 (two) times a week. 120 mL 1    latanoprost (XALATAN) 0.005 % ophthalmic solution Place 1 drop into both eyes every morning.       losartan (COZAAR) 50 MG tablet Take 1 tablet (50 mg total) by mouth daily. 90 tablet 3    metoprolol tartrate (LOPRESSOR) 25 MG tablet Take 1/2 (one-half) tablet by mouth twice daily 30 tablet 2    Misc Natural Products (OSTEO BI-FLEX/5-LOXIN ADVANCED PO) Take 2 tablets by mouth daily.       sertraline (ZOLOFT) 50 MG tablet Take 1 tablet (50 mg total) by mouth daily. 90 tablet 3    traMADol (ULTRAM) 50 MG tablet TAKE 1 TABLET BY MOUTH EVERY 4 HOURS AS NEEDED FOR PAIN 30 tablet 0    traZODone (DESYREL) 50 MG tablet Take 0.5-1 tablets (25-50 mg total) by mouth at bedtime as needed for sleep. 30 tablet 3    No current facility-administered medications for this visit.   Allergies  Allergen Reactions   Heparin     HIT antibody and SRA positive    Social History   Tobacco Use   Smoking status: Never   Smokeless tobacco: Never  Substance Use Topics   Alcohol use: Yes     Alcohol/week: 12.0 standard drinks    Types: 12 Cans of beer per week    Comment: weekly    Family History  Adopted: Yes  Problem Relation Age of Onset   Cancer Mother        breast cancer   Alcohol abuse Father    Breast cancer Sister      Review of Systems  Musculoskeletal:  Positive for arthralgias.  All other systems reviewed and are negative.  Objective:  Physical Exam HENT:     Head: Normocephalic.  Eyes:     Pupils: Pupils are equal, round, and reactive to light.  Cardiovascular:     Rate and Rhythm: Normal rate.     Pulses: Normal pulses.  Pulmonary:     Effort: Pulmonary effort is normal.  Abdominal:  Palpations: Abdomen is soft.  Genitourinary:    Comments: Deferred Musculoskeletal:        General: Tenderness present.     Cervical back: Normal range of motion.  Skin:    General: Skin is warm.  Neurological:     Mental Status: He is alert and oriented to person, place, and time.  Psychiatric:        Mood and Affect: Mood normal.    Vital signs in last 24 hours: '@VSRANGES'$ @  Labs:   Estimated body mass index is 37.04 kg/m as calculated from the following:   Height as of 07/27/20: 5' 6.5" (1.689 m).   Weight as of 07/27/20: 105.7 kg.   Imaging Review Plain radiographs demonstrate severe degenerative joint disease of the left knee(s). The bone quality appears to be adequate for age and reported activity level.      Assessment/Plan:  End stage arthritis, left knee   The patient history, physical examination, clinical judgment of the provider and imaging studies are consistent with end stage degenerative joint disease of the left knee(s) and total knee arthroplasty is deemed medically necessary. The treatment options including medical management, injection therapy arthroscopy and arthroplasty were discussed at length. The risks and benefits of total knee arthroplasty were presented and reviewed. The risks due to aseptic loosening, infection,  stiffness, patella tracking problems, thromboembolic complications and other imponderables were discussed. The patient acknowledged the explanation, agreed to proceed with the plan and consent was signed. Patient is being admitted for inpatient treatment for surgery, pain control, PT, OT, prophylactic antibiotics, VTE prophylaxis, progressive ambulation and ADL's and discharge planning. The patient is planning to be discharged  home after overnight observation     Patient's anticipated LOS is less than 2 midnights, meeting these requirements: - Lives within 1 hour of care - Has a competent adult at home to recover with post-op recover - NO history of  - Chronic pain requiring opiods  - Diabetes  - Coronary Artery Disease  - Heart failure  - Heart attack  - Stroke  - DVT/VTE  - Cardiac arrhythmia  - Respiratory Failure/COPD  - Renal failure  - Anemia  - Advanced Liver disease

## 2020-09-07 NOTE — Patient Instructions (Addendum)
DUE TO COVID-19 ONLY ONE VISITOR IS ALLOWED TO COME WITH YOU AND STAY IN THE WAITING ROOM ONLY DURING PRE OP AND PROCEDURE.   **NO VISITORS ARE ALLOWED IN THE SHORT STAY AREA OR RECOVERY ROOM!!**  IF YOU WILL BE ADMITTED INTO THE HOSPITAL YOU ARE ALLOWED ONLY TWO SUPPORT PEOPLE DURING VISITATION HOURS ONLY (10AM -8PM)   The support person(s) may change daily. The support person(s) must pass our screening, gel in and out, and wear a mask at all times, including in the patient's room. Patients must also wear a mask when staff or their support person are in the room.  No visitors under the age of 40. Any visitor under the age of 23 must be accompanied by an adult.    COVID SWAB TESTING MUST BE COMPLETED ON:  09/13/20 **MUST PRESENT COMPLETED FORM AT TESTING SITE**    [place] (backside of the building) Open 8am-3pm. No appointment needed. You are not required to quarantine, however you are required to wear a well-fitted mask when you are out and around people not in your household.  Hand Hygiene often Do NOT share personal items Notify your provider if you are in close contact with someone who has COVID or you develop fever 100.4 or greater, new onset of sneezing, cough, sore throat, shortness of breath or body aches.       Your procedure is scheduled on: 09/15/20   Report to [facility] Main  Entrance    Report to admitting at 7:45 AM   Call this number if you have problems the morning of surgery [phone]   Do not eat food :After Midnight.   May have liquids until 7:30 AM day of surgery  CLEAR LIQUID DIET  Foods Allowed                                                                     Foods Excluded  Water, Black Coffee and tea (no milk or creamer)           liquids that you cannot  Plain Jell-O in any flavor  (No red)                                    see through such as: Fruit ices (not with fruit pulp)                                             milk, soups, orange juice              Iced Popsicles (No red)                                                All solid food  Apple juices Sports drinks like Gatorade (No red) Lightly seasoned clear broth or consume(fat free) Sugar     The day of surgery:  Drink ONE (1) Pre-Surgery G2 by 7:30 am the morning of surgery. Drink in one sitting. Do not sip.  This drink was given to you during your hospital  pre-op appointment visit. Nothing else to drink after completing the  Pre-Surgery G2.          If you have questions, please contact your surgeon's office.     Oral Hygiene is also important to reduce your risk of infection.                                    Remember - BRUSH YOUR TEETH THE MORNING OF SURGERY WITH YOUR REGULAR TOOTHPASTE   Take these medicines the morning of surgery with A SIP OF WATER: Zyloprim, Xanax, Lipitor, Keflex, Lopressor, Sertraline, Tramadol.                               You may not have any metal on your body including jewelry, and body piercing             Do not wear lotions, powders, cologne, or deodorant              Men may shave face and neck.   Do not bring valuables to the hospital. [place].   Bring small overnight bag day of surgery.   Special Instructions: Bring a copy of your healthcare power of attorney and living will documents         the day of surgery if you haven't scanned them in before.   Please read over the following fact sheets you were given: IF YOU HAVE QUESTIONS ABOUT YOUR PRE OP INSTRUCTIONS PLEASE CALL [facility] - Preparing for Surgery Before surgery, you can play an important role.  Because skin is not sterile, your skin needs to be as free of germs as possible.  You can reduce the number of germs on your skin by washing with CHG (chlorahexidine gluconate) soap before surgery.  CHG is an antiseptic cleaner which  kills germs and bonds with the skin to continue killing germs even after washing. Please DO NOT use if you have an allergy to CHG or antibacterial soaps.  If your skin becomes reddened/irritated stop using the CHG and inform your nurse when you arrive at Short Stay. Do not shave (including legs and underarms) for at least 48 hours prior to the first CHG shower.  You may shave your face/neck.  Please follow these instructions carefully:  1.  Shower with CHG Soap the night before surgery and the  morning of surgery.  2.  If you choose to wash your hair, wash your hair first as usual with your normal  shampoo.  3.  After you shampoo, rinse your hair and body thoroughly to remove the shampoo.                             4.  Use CHG as you would any other liquid soap.  You can apply chg directly to the skin and wash.  Gently with a scrungie  or clean washcloth.  5.  Apply the CHG Soap to your body ONLY FROM THE NECK DOWN.   Do   not use on face/ open                           Wound or open sores. Avoid contact with eyes, ears mouth and   genitals (private parts).                       Wash face,  Genitals (private parts) with your normal soap.             6.  Wash thoroughly, paying special attention to the area where your    surgery  will be performed.  7.  Thoroughly rinse your body with warm water from the neck down.  8.  DO NOT shower/wash with your normal soap after using and rinsing off the CHG Soap.                9.  Pat yourself dry with a clean towel.            10.  Wear clean pajamas.            11.  Place clean sheets on your bed the night of your first shower and do not  sleep with pets. Day of Surgery : Do not apply any lotions/deodorants the morning of surgery.  Please wear clean clothes to the hospital/surgery center.  FAILURE TO FOLLOW THESE INSTRUCTIONS MAY RESULT IN THE CANCELLATION OF YOUR SURGERY  PATIENT SIGNATURE_________________________________  NURSE  SIGNATURE__________________________________  ________________________________________________________________________   Adam Phenix  An incentive spirometer is a tool that can help keep your lungs clear and active. This tool measures how well you are filling your lungs with each breath. Taking long deep breaths may help reverse or decrease the chance of developing breathing (pulmonary) problems (especially infection) following: A long period of time when you are unable to move or be active. BEFORE THE PROCEDURE  If the spirometer includes an indicator to show your best effort, your nurse or respiratory therapist will set it to a desired goal. If possible, sit up straight or lean slightly forward. Try not to slouch. Hold the incentive spirometer in an upright position. INSTRUCTIONS FOR USE  Sit on the edge of your bed if possible, or sit up as far as you can in bed or on a chair. Hold the incentive spirometer in an upright position. Breathe out normally. Place the mouthpiece in your mouth and seal your lips tightly around it. Breathe in slowly and as deeply as possible, raising the piston or the ball toward the top of the column. Hold your breath for 3-5 seconds or for as long as possible. Allow the piston or ball to fall to the bottom of the column. Remove the mouthpiece from your mouth and breathe out normally. Rest for a few seconds and repeat Steps 1 through 7 at least 10 times every 1-2 hours when you are awake. Take your time and take a few normal breaths between deep breaths. The spirometer may include an indicator to show your best effort. Use the indicator as a goal to work toward during each repetition. After each set of 10 deep breaths, practice coughing to be sure your lungs are clear. If you have an incision (the cut made at the time of surgery), support your incision when coughing by placing a pillow or rolled  up towels firmly against it. Once you are able to get out of  bed, walk around indoors and cough well. You may stop using the incentive spirometer when instructed by your caregiver.  RISKS AND COMPLICATIONS Take your time so you do not get dizzy or light-headed. If you are in pain, you may need to take or ask for pain medication before doing incentive spirometry. It is harder to take a deep breath if you are having pain. AFTER USE Rest and breathe slowly and easily. It can be helpful to keep track of a log of your progress. Your caregiver can provide you with a simple table to help with this. If you are using the spirometer at home, follow these instructions: [place] IF:  You are having difficultly using the spirometer. You have trouble using the spirometer as often as instructed. Your pain medication is not giving enough relief while using the spirometer. You develop fever of 100.5 F (38.1 C) or higher. SEEK IMMEDIATE MEDICAL CARE IF:  You cough up bloody sputum that had not been present before. You develop fever of 102 F (38.9 C) or greater. You develop worsening pain at or near the incision site. MAKE SURE YOU:  Understand these instructions. Will watch your condition. Will get help right away if you are not doing well or get worse. Document Released: 04/30/2006 Document Revised: 03/12/2011 Document Reviewed: 07/01/2006 [employer] Patient Information 2014 [employer], [place].   ________________________________________________________________________  WHAT IS A BLOOD TRANSFUSION? Blood Transfusion Information  A transfusion is the replacement of blood or some of its parts. Blood is made up of multiple cells which provide different functions. Red blood cells carry oxygen and are used for blood loss replacement. White blood cells fight against infection. Platelets control bleeding. Plasma helps clot blood. Other blood products are available for specialized needs, such as hemophilia or other clotting disorders. BEFORE THE TRANSFUSION   Who gives blood for transfusions?  Healthy volunteers who are fully evaluated to make sure their blood is safe. This is blood bank blood. Transfusion therapy is the safest it has ever been in the practice of medicine. Before blood is taken from a donor, a complete history is taken to make sure that person has no history of diseases nor engages in risky social behavior (examples are intravenous drug use or sexual activity with multiple partners). The donor's travel history is screened to minimize risk of transmitting infections, such as malaria. The donated blood is tested for signs of infectious diseases, such as HIV and hepatitis. The blood is then tested to be sure it is compatible with you in order to minimize the chance of a transfusion reaction. If you or a relative donates blood, this is often done in anticipation of surgery and is not appropriate for emergency situations. It takes many days to process the donated blood. RISKS AND COMPLICATIONS Although transfusion therapy is very safe and saves many lives, the main dangers of transfusion include:  Getting an infectious disease. Developing a transfusion reaction. This is an allergic reaction to something in the blood you were given. Every precaution is taken to prevent this. The decision to have a blood transfusion has been considered carefully by your caregiver before blood is given. Blood is not given unless the benefits outweigh the risks. AFTER THE TRANSFUSION Right after receiving a blood transfusion, you will usually feel much better and more energetic. This is especially true if your red blood cells have gotten low (anemic). The transfusion raises the level  of the red blood cells which carry oxygen, and this usually causes an energy increase. The nurse administering the transfusion will monitor you carefully for complications. HOME CARE INSTRUCTIONS  No special instructions are needed after a transfusion. You may find your energy is  better. Speak with your caregiver about any limitations on activity for underlying diseases you may have. SEEK MEDICAL CARE IF:  Your condition is not improving after your transfusion. You develop redness or irritation at the intravenous (IV) site. SEEK IMMEDIATE MEDICAL CARE IF:  Any of the following symptoms occur over the next 12 hours: Shaking chills. You have a temperature by mouth above 102 F (38.9 C), not controlled by medicine. Chest, back, or muscle pain. People around you feel you are not acting correctly or are confused. Shortness of breath or difficulty breathing. Dizziness and fainting. You get a rash or develop hives. You have a decrease in urine output. Your urine turns a dark color or changes to pink, red, or brown. Any of the following symptoms occur over the next 10 days: You have a temperature by mouth above 102 F (38.9 C), not controlled by medicine. Shortness of breath. Weakness after normal activity. The white part of the eye turns yellow (jaundice). You have a decrease in the amount of urine or are urinating less often. Your urine turns a dark color or changes to pink, red, or brown. Document Released: 12/16/1999 Document Revised: 03/12/2011 Document Reviewed: 08/04/2007 [facility] Patient Information 2014 [place], [place].  _______________________________________________________________________

## 2020-09-07 NOTE — Progress Notes (Addendum)
COVID swab appointment: 09/13/20  COVID Vaccine Completed: yes x3 Date COVID Vaccine completed: 01/27/19, 02/24/19 Has received booster: 10/31/19 COVID vaccine manufacturer:   Moderna     Date of COVID positive in last 90 days: pt states sometime in June had positive home test. Did not go to doctor  PCP - [name], MD Cardiologist -  [name], MD  Cardiac clearance 03/14/20 by [name], Epic  Chest x-ray - N/a EKG - 03/14/20 Epic Stress Test - yes many years ago per pt ECHO - 12/07/19 Epic Cardiac Cath - 03/04/19 Epic Pacemaker/ICD device last checked:N/a Spinal Cord Stimulator: N/a  Sleep Study - N/a CPAP -   Fasting Blood Sugar - pre DM per pt, no meds. PAT CBG 125 Checks Blood Sugar _____ times a day  Blood Thinner Instructions: Aspirin Instructions: ASA 81, no instructions. Pt will call cardiologist. Last Dose:  Activity level: Can go up a flight of stairs and perform activities of daily living without stopping and without symptoms of chest pain. SOB with activity. Not new, increased within last few weeks. Seeing cardiologist 9/12       Anesthesia review: OSA, preDM, HIT, cardiomyopathy, CAD, A fib, NSTEMI, aortic valve stenosis, HTN, worsening SOB, STOP BANG 6, Hgb 8.5  Patient denies shortness of breath, fever, cough and chest pain at PAT appointment   Patient verbalized understanding of instructions that were given to them at the PAT appointment. Patient was also instructed that they will need to review over the PAT instructions again at home before surgery.

## 2020-09-09 ENCOUNTER — Encounter (HOSPITAL_COMMUNITY): Payer: Self-pay

## 2020-09-09 ENCOUNTER — Encounter (HOSPITAL_COMMUNITY)
Admission: RE | Admit: 2020-09-09 | Discharge: 2020-09-09 | Disposition: A | Discharge status: Home / Self Care | Payer: Medicare HMO | Source: Ambulatory Visit | Attending: Orthopedic Surgery | Admitting: Orthopedic Surgery

## 2020-09-09 ENCOUNTER — Other Ambulatory Visit: Payer: Self-pay

## 2020-09-09 DIAGNOSIS — R7303 Prediabetes: Secondary | ICD-10-CM | POA: Insufficient documentation

## 2020-09-09 DIAGNOSIS — Z01812 Encounter for preprocedural laboratory examination: Secondary | ICD-10-CM | POA: Diagnosis present

## 2020-09-09 DIAGNOSIS — I2510 Atherosclerotic heart disease of native coronary artery without angina pectoris: Secondary | ICD-10-CM | POA: Insufficient documentation

## 2020-09-09 DIAGNOSIS — Z7982 Long term (current) use of aspirin: Secondary | ICD-10-CM | POA: Insufficient documentation

## 2020-09-09 DIAGNOSIS — I10 Essential (primary) hypertension: Secondary | ICD-10-CM | POA: Diagnosis not present

## 2020-09-09 DIAGNOSIS — I4891 Unspecified atrial fibrillation: Secondary | ICD-10-CM | POA: Diagnosis not present

## 2020-09-09 DIAGNOSIS — Z79899 Other long term (current) drug therapy: Secondary | ICD-10-CM | POA: Insufficient documentation

## 2020-09-09 DIAGNOSIS — I252 Old myocardial infarction: Secondary | ICD-10-CM | POA: Insufficient documentation

## 2020-09-09 HISTORY — DX: Acute myocardial infarction, unspecified: I21.9

## 2020-09-09 HISTORY — DX: Other complications of anesthesia, initial encounter: T88.59XA

## 2020-09-09 HISTORY — DX: Anxiety disorder, unspecified: F41.9

## 2020-09-09 HISTORY — DX: Dyspnea, unspecified: R06.00

## 2020-09-09 HISTORY — DX: Unspecified osteoarthritis, unspecified site: M19.90

## 2020-09-09 HISTORY — DX: Prediabetes: R73.03

## 2020-09-09 LAB — COMPREHENSIVE METABOLIC PANEL
ALT: 38 U/L (ref 0–44)
AST: 51 U/L — ABNORMAL HIGH (ref 15–41)
Albumin: 4.2 g/dL (ref 3.5–5.0)
Alkaline Phosphatase: 43 U/L (ref 38–126)
Anion gap: 9 (ref 5–15)
BUN: 22 mg/dL (ref 8–23)
CO2: 27 mmol/L (ref 22–32)
Calcium: 8.8 mg/dL — ABNORMAL LOW (ref 8.9–10.3)
Chloride: 111 mmol/L (ref 98–111)
Creatinine, Ser: 0.76 mg/dL (ref 0.61–1.24)
GFR, Estimated: >60 mL/min (ref >60)
Glucose, Bld: 112 mg/dL — ABNORMAL HIGH (ref 70–99)
Potassium: 4.4 mmol/L (ref 3.5–5.1)
Sodium: 147 mmol/L — ABNORMAL HIGH (ref 135–145)
Total Bilirubin: 1.9 mg/dL — ABNORMAL HIGH (ref 0.3–1.2)
Total Protein: 6.4 g/dL — ABNORMAL LOW (ref 6.5–8.1)

## 2020-09-09 LAB — HEMOGLOBIN A1C
Hgb A1c MFr Bld: 4.5 % — ABNORMAL LOW (ref 4.8–5.6)
Mean Plasma Glucose: 82.45 mg/dL

## 2020-09-09 LAB — CBC
HCT: 29.9 % — ABNORMAL LOW (ref 39.0–52.0)
Hemoglobin: 8.5 g/dL — ABNORMAL LOW (ref 13.0–17.0)
MCH: 27.3 pg (ref 26.0–34.0)
MCHC: 28.4 g/dL — ABNORMAL LOW (ref 30.0–36.0)
MCV: 96.1 fL (ref 80.0–100.0)
Platelets: 243 10*3/uL (ref 150–400)
RBC: 3.11 MIL/uL — ABNORMAL LOW (ref 4.22–5.81)
RDW: 15.2 % (ref 11.5–15.5)
WBC: 7.2 10*3/uL (ref 4.0–10.5)
nRBC: 0 % (ref 0.0–0.2)

## 2020-09-09 LAB — GLUCOSE, CAPILLARY: Glucose-Capillary: 125 mg/dL — ABNORMAL HIGH (ref 70–99)

## 2020-09-09 LAB — PROTIME-INR
INR: 1.2 (ref 0.8–1.2)
Prothrombin Time: 15.2 seconds (ref 11.4–15.2)

## 2020-09-09 LAB — SURGICAL PCR SCREEN
MRSA, PCR: NEGATIVE
Staphylococcus aureus: NEGATIVE

## 2020-09-09 NOTE — Progress Notes (Signed)
Hgb came back 8.5. Results routed to Dr. Lyla Glassing.

## 2020-09-09 NOTE — Progress Notes (Signed)
   09/09/20 0853  OBSTRUCTIVE SLEEP APNEA  Have you ever been diagnosed with sleep apnea through a sleep study? No  Do you snore loudly (loud enough to be heard through closed doors)?  1  Do you often feel tired, fatigued, or sleepy during the daytime (such as falling asleep during driving or talking to someone)? 1  Has anyone observed you stop breathing during your sleep? 0  Do you have, or are you being treated for high blood pressure? 1  BMI more than 35 kg/m2? 1  Age > 50 (1-yes) 1  Neck circumference greater than:Male 16 inches or larger, Male 17inches or larger? 0  Male Gender (Yes=1) 1  Obstructive Sleep Apnea Score 6

## 2020-09-10 NOTE — Progress Notes (Signed)
Cardiology Office Note   Date:  09/12/2020   ID:  Gregory, [name] 06/10/1950, MRN [id number]  PCP:  Jinny Sanders, MD  Cardiologist:   [name], MD   Chief Complaint  Patient presents with   Shortness of Breath       History of Present Illness: Gregory [name] is a 70 y.o. male for follow up of CAD/CABG and AVR.   He had a cardiac catheterization during recent hospitalization on 03/04/2019 which revealed severe calcific disease in the mid LAD, the circumflex had mild to moderate proximal stenosis, the RCA, a large dominant vessel, had severe proximal stenosis and severe mid stenosis with poor flow into the distal vessel.  He was also found to have severe aortic regurgitation and moderate aortic valve stenosis, with a mean gradient across aortic valve 57mHg with AVA, but judged to be severe aortic insufficiency in the setting of dilated aortic root with high flow from AI leading to elevated gradient across aortic valve.  He underwent coronary artery bypass graft with aortic valve replacement on 03/06/2019.  The patient had LIMA to LAD, left radial artery to PDA, predicted RIMA to the right posterior lateral artery, with open left radial artery harvest, bilateral IMA harvesting.  Also, 25 mm [name] Intuity Bovine bioprosthetic aortic valve placement.  Of note, the patient had a severe reaction to heparin with HIT and severe profound thrombocytopenia postoperatively. He had postoperative atrial fibrillation and therefore was started on amiodarone 200 mg daily.  He had a left arm hematoma and an elevated INR.  He was followed by heme.   He was started back on warfarin as his platelets increased and he now no longer needs to be seen by Heme per their report.  On a follow an echo he had a small perivalvular leak.  Otherwise it was stable.    He was to have  knee surgery since I last saw him.  However, this was recently canceled because he is found to be anemic.  He has had previous  iron deficiency anemia and was on iron which was discontinued.  He has had no active bleeding that he knows of.  Of note he also has had increased shortness of breath for couple of weeks.  He has been feeling anxious sometimes at night.  He feels like his heart might be skipping.  He has had some increased lower extremity swelling.  His weight is up a few pounds.  He still trying to work daily but he gets dyspneic with exertion more frequently than he did.  He is not describing classic PND or orthopnea.  He said no presyncope or syncope.  He said no chest pressure, neck or arm discomfort.   Past Medical History:  Diagnosis Date   Alcohol abuse    Anxiety    Aortic valve disease    AI and AS, Moderate   Arthritis    Chest pain [id number]  Complication of anesthesia    post op confusion   Coronary artery disease    Drug use    Dyspnea    Edema of both lower extremities    Glaucoma    Heart murmur    Hypertension    Myocardial infarction ([place]    OSA (obstructive sleep apnea)    Pre-diabetes    Shingles     Past Surgical History:  Procedure Laterality Date   AORTIC VALVE REPLACEMENT N/A 03/06/2019   Procedure: AORTIC VALVE REPLACEMENT (AVR), USING INTUITY  25MM;  Surgeon: Wonda Olds, MD;  Location: [place];  Service: Open Heart Surgery;  Laterality: N/A;   CARDIAC CATHETERIZATION     CATARACT EXTRACTION     CORONARY ARTERY BYPASS GRAFT N/A 03/06/2019   Procedure: CORONARY ARTERY BYPASS GRAFTING (CABG), ON PUMP, TIMES THREE, USING BILATERAL INTERNAL MAMMARIES AND LEFT RADIAL ARTERY HARVEST;  Surgeon: Wonda Olds, MD;  Location: [place];  Service: Open Heart Surgery;  Laterality: N/A;  BILATERAL IMA   LEFT HEART CATH AND CORONARY ANGIOGRAPHY N/A 03/04/2019   Procedure: LEFT HEART CATH AND CORONARY ANGIOGRAPHY;  Surgeon: Burnell Blanks, MD;  Location: [place] [facility];  Service: Cardiovascular;  Laterality: N/A;   None     RADIAL ARTERY HARVEST Left 03/06/2019   Procedure:  RADIAL ARTERY HARVEST;  Surgeon: Wonda Olds, MD;  Location: [place];  Service: Open Heart Surgery;  Laterality: Left;   TEE WITHOUT CARDIOVERSION N/A 03/06/2019   Procedure: TRANSESOPHAGEAL ECHOCARDIOGRAM (TEE);  Surgeon: Wonda Olds, MD;  Location: [place];  Service: Open Heart Surgery;  Laterality: N/A;     Current Outpatient Medications  Medication Sig Dispense Refill   allopurinol (ZYLOPRIM) 100 MG tablet Take 1 tablet (100 mg total) by mouth daily. 30 tablet 6   ALPRAZolam (XANAX) 0.5 MG tablet TAKE 1 TABLET BY MOUTH ONCE DAILY AS NEEDED FOR ANXIETY TAKE  30  MINUTES  PRIOR  TO  PLANE  FLIGHT  OR  AS  NEEDED  FOR  ANXIETY (Patient taking differently: Take 0.5 mg by mouth daily as needed for anxiety.) 20 tablet 0   aspirin EC 81 MG tablet Take 1 tablet (81 mg total) by mouth daily. Swallow whole. 30 tablet 11   atorvastatin (LIPITOR) 80 MG tablet Take 1 tablet (80 mg total) by mouth daily. 90 tablet 3   colchicine 0.6 MG tablet Take 2 tablets by mouth once daily 60 tablet 5   furosemide (LASIX) 20 MG tablet Take 1 tablet (20 mg total) by mouth daily. 90 tablet 3   guaiFENesin (MUCINEX) 600 MG 12 hr tablet Take 600 mg by mouth 2 (two) times daily as needed for cough.     ketoconazole (NIZORAL) 2 % shampoo Apply 1 application topically 2 (two) times a week. 120 mL 1   latanoprost (XALATAN) 0.005 % ophthalmic solution Place 1 drop into both eyes every morning.      losartan (COZAAR) 25 MG tablet Take 25 mg by mouth daily.     metoprolol tartrate (LOPRESSOR) 25 MG tablet Take 1/2 (one-half) tablet by mouth twice daily 30 tablet 2   sertraline (ZOLOFT) 50 MG tablet Take 1 tablet (50 mg total) by mouth daily. 90 tablet 3   traMADol (ULTRAM) 50 MG tablet TAKE 1 TABLET BY MOUTH EVERY 4 HOURS AS NEEDED FOR PAIN (Patient taking differently: Take 50 mg by mouth every 4 (four) hours as needed for moderate pain.) 30 tablet 0   traZODone (DESYREL) 50 MG tablet Take 0.5-1 tablets (25-50 mg total) by  mouth at bedtime as needed for sleep. 30 tablet 3   No current facility-administered medications for this visit.    Allergies:   Heparin    ROS:  Please see the history of present illness.   Otherwise, review of systems are positive for none.   All other systems are reviewed and negative.    PHYSICAL EXAM: VS:  BP 128/60   Pulse (!) 44   Ht '5\' 7"'$  (1.702 m)   Wt 236  lb 6.4 oz (107.2 kg)   SpO2 96%   BMI 37.03 kg/m  , BMI Body mass index is 37.03 kg/m.  GENERAL:  Well appearing NECK:  No jugular venous distention, waveform within normal limits, carotid upstroke brisk and symmetric, no bruits, no thyromegaly LUNGS:  Clear to auscultation bilaterally CHEST: Well healed sternotomy scar HEART:  PMI not displaced or sustained,S1 and S2 within normal limits, no S3, no S4, no clicks, no rubs, 3 out of 6 apical systolic murmur radiating up the aortic outflow tract, slight diastolic murmur heard at the third left intercostal space murmurs ABD:  Flat, positive bowel sounds normal in frequency in pitch, no bruits, no rebound, no guarding, no midline pulsatile mass, no hepatomegaly, no splenomegaly EXT:  2 plus pulses throughout, moderate bilateral lower extremity edema edema, no cyanosis no clubbing   EKG:  EKG  ordered today. Sinus rhythm, rate 75, left bundle branch block 2-1 heart block intermittent   Recent Labs: 09/09/2020: ALT 38; BUN 22; Creatinine, Ser 0.76; Hemoglobin 8.5; Platelets 243; Potassium 4.4; Sodium 147    Lipid Panel    Component Value Date/Time   CHOL 151 03/21/2020 0805   CHOL 132 08/07/2019 0811   TRIG 98.0 03/21/2020 0805   TRIG 162 (H) 10/25/2005 1507   HDL 67.70 03/21/2020 0805   HDL 61 08/07/2019 0811   CHOLHDL 2 03/21/2020 0805   VLDL 19.6 03/21/2020 0805   LDLCALC 63 03/21/2020 0805   LDLCALC 52 08/07/2019 0811   LDLDIRECT 151.0 01/05/2016 0748      Wt Readings from Last 3 Encounters:  09/12/20 236 lb 6.4 oz (107.2 kg)  09/09/20 232 lb 4 oz (105.3  kg)  07/27/20 233 lb (105.7 kg)      Other studies Reviewed: Additional studies/ records that were reviewed today include:   Labs Review of the above records demonstrates:  Please see elsewhere in the note.     ASSESSMENT AND PLAN:  CAD: S/P CABG.   he is having no anginal symptoms.  He will continue with risk reduction.  HIT: He is aware to alert everybody to his heparin allergy.  S/P Bioprosthetic AoV replacement:    This was stable in Dec 2021.  I am going to look at this again in December.  He understands endocarditis prophylaxis.  He does have a mean gradient of 21.5 and some paravalvular leak.  Hyperlipidemia:   LDL was 63 with an HDL of 67.  No change in therapy.  Cardiomyopathy: At the last visit I increased his Cozaar.  We will continue on the meds as listed.  HTN:  This is being managed in the context of treating his CHF  BRADYCARDIA:    He has 2-1 heart block which I suspect progressive symptoms.  I am going to apply a 3-day monitor and likely will be referring him for pacemaker.   Current medicines are reviewed at length with the patient today.  The patient does not have concerns regarding medicines.  The following changes have been made:   As above Labs/ tests ordered today include:   Orders Placed This Encounter  Procedures   LONG TERM MONITOR (3-14 DAYS)   EKG 12-Lead      Disposition:   FU with me in about 1 month or sooner as needed.   Signed, [name], MD  09/12/2020 9:32 AM    [facility]

## 2020-09-12 ENCOUNTER — Ambulatory Visit (INDEPENDENT_AMBULATORY_CARE_PROVIDER_SITE_OTHER): Payer: Medicare HMO

## 2020-09-12 ENCOUNTER — Other Ambulatory Visit: Payer: Self-pay | Admitting: Family Medicine

## 2020-09-12 ENCOUNTER — Ambulatory Visit: Payer: Medicare HMO | Admitting: Cardiology

## 2020-09-12 ENCOUNTER — Encounter: Payer: Self-pay | Admitting: Cardiology

## 2020-09-12 ENCOUNTER — Other Ambulatory Visit: Payer: Self-pay

## 2020-09-12 VITALS — BP 128/60 | HR 44 | Ht 67.0 in | Wt 236.4 lb

## 2020-09-12 DIAGNOSIS — Z952 Presence of prosthetic heart valve: Secondary | ICD-10-CM

## 2020-09-12 DIAGNOSIS — I10 Essential (primary) hypertension: Secondary | ICD-10-CM | POA: Diagnosis not present

## 2020-09-12 DIAGNOSIS — I480 Paroxysmal atrial fibrillation: Secondary | ICD-10-CM

## 2020-09-12 DIAGNOSIS — E785 Hyperlipidemia, unspecified: Secondary | ICD-10-CM | POA: Diagnosis not present

## 2020-09-12 DIAGNOSIS — I2510 Atherosclerotic heart disease of native coronary artery without angina pectoris: Secondary | ICD-10-CM | POA: Diagnosis not present

## 2020-09-12 DIAGNOSIS — R0683 Snoring: Secondary | ICD-10-CM

## 2020-09-12 MED ORDER — FUROSEMIDE 20 MG PO TABS: 20.0000 mg | ORAL_TABLET | Freq: Every day | ORAL | 3 refills | Status: DC

## 2020-09-12 NOTE — Progress Notes (Unsigned)
Patient enrolled for [employer] to mail a 3 day ZIO XT monitor to his address on file.

## 2020-09-12 NOTE — Patient Instructions (Signed)
Medication Instructions:  Start Lasix 20 mg daily   *If you need a refill on your cardiac medications before your next appointment, please call your pharmacy*   Testing/Procedures: [place] Monitor Instructions  Your physician has requested you wear a ZIO patch monitor for 3 days.  This is a single patch monitor. [employer] supplies one patch monitor per enrollment. Additional stickers are not available. Please do not apply patch if you will be having a Nuclear Stress Test,  Echocardiogram, Cardiac CT, MRI, or Chest Xray during the period you would be wearing the  monitor. The patch cannot be worn during these tests. You cannot remove and re-apply the  ZIO XT patch monitor.  Your ZIO patch monitor will be mailed 3 day USPS to your address on file. It may take 3-5 days  to receive your monitor after you have been enrolled.  Once you have received your monitor, please review the enclosed instructions. Your monitor  has already been registered assigning a specific monitor serial # to you.  Billing and Patient Assistance Program Information  We have supplied [employer] with any of your insurance information on file for billing purposes. [employer] offers a sliding scale Patient Assistance Program for patients that do not have  insurance, or whose insurance does not completely cover the cost of the ZIO monitor.  You must apply for the Patient Assistance Program to qualify for this discounted rate.  To apply, please call [employer] at [phone], select option 4, select option 2, ask to apply for  Patient Assistance Program. Theodore Demark will ask your household income, and how many people  are in your household. They will quote your out-of-pocket cost based on that information.  [employer] will also be able to set up a 40-month interest-free payment plan if needed.  Applying the monitor   Shave hair from upper left chest.  Hold abrader disc by orange tab. Rub abrader in 40 strokes over the upper  left chest as  indicated in your monitor instructions.  Clean area with 4 enclosed alcohol pads. Let dry.  Apply patch as indicated in monitor instructions. Patch will be placed under collarbone on left  side of chest with arrow pointing upward.  Rub patch adhesive wings for 2 minutes. Remove white label marked "1". Remove the white  label marked "2". Rub patch adhesive wings for 2 additional minutes.  While looking in a mirror, press and release button in center of patch. A small green light will  flash 3-4 times. This will be your only indicator that the monitor has been turned on.  Do not shower for the first 24 hours. You may shower after the first 24 hours.  Press the button if you feel a symptom. You will hear a small click. Record Date, Time and  Symptom in the Patient Logbook.  When you are ready to remove the patch, follow instructions on the last 2 pages of Patient  Logbook. Stick patch monitor onto the last page of Patient Logbook.  Place Patient Logbook in the blue and white box. Use locking tab on box and tape box closed  securely. The blue and white box has prepaid postage on it. Please place it in the mailbox as  soon as possible. Your physician should have your test results approximately 7 days after the  [facility]  Call [place]at [phone] you have questions regarding  your ZIO XT patch monitor. Call them immediately if  you see an orange light blinking on your  monitor.  If your monitor falls off in less than 4 days, contact our Monitor department at [phone].  If your monitor becomes loose or falls off after 4 days call [employer] at [phone] for  suggestions on securing your monitor    Follow-Up: At [facility], you and your health needs are our priority.  As part of our continuing mission to provide you with exceptional heart care, we have created designated Provider Care Teams.  These Care  Teams include your primary Cardiologist (physician) and Advanced Practice Providers (APPs -  Physician Assistants and Nurse Practitioners) who all work together to provide you with the care you need, when you need it.  We recommend signing up for the patient portal called "MyChart".  Sign up information is provided on this After Visit Summary.  MyChart is used to connect with patients for Virtual Visits (Telemedicine).  Patients are able to view lab/test results, encounter notes, upcoming appointments, etc.  Non-urgent messages can be sent to your provider as well.   To learn more about what you can do with MyChart, go to [email or web address].    Your next appointment:   2 week(s)  The format for your next appointment:   In Person  Provider:   [name], MD

## 2020-09-14 DIAGNOSIS — I480 Paroxysmal atrial fibrillation: Secondary | ICD-10-CM

## 2020-09-15 ENCOUNTER — Telehealth (INDEPENDENT_AMBULATORY_CARE_PROVIDER_SITE_OTHER): Payer: Medicare HMO | Admitting: Family Medicine

## 2020-09-15 ENCOUNTER — Ambulatory Visit (HOSPITAL_COMMUNITY): Admission: RE | Admit: 2020-09-15 | Payer: Medicare HMO | Source: Ambulatory Visit | Admitting: Orthopedic Surgery

## 2020-09-15 ENCOUNTER — Encounter: Payer: Self-pay | Admitting: Gastroenterology

## 2020-09-15 ENCOUNTER — Encounter: Payer: Self-pay | Admitting: Family Medicine

## 2020-09-15 ENCOUNTER — Other Ambulatory Visit: Payer: Self-pay

## 2020-09-15 ENCOUNTER — Encounter (HOSPITAL_COMMUNITY): Admission: RE | Payer: Self-pay | Source: Ambulatory Visit

## 2020-09-15 VITALS — Ht 66.5 in

## 2020-09-15 DIAGNOSIS — D509 Iron deficiency anemia, unspecified: Secondary | ICD-10-CM

## 2020-09-15 DIAGNOSIS — I10 Essential (primary) hypertension: Secondary | ICD-10-CM

## 2020-09-15 DIAGNOSIS — M1A09X0 Idiopathic chronic gout, multiple sites, without tophus (tophi): Secondary | ICD-10-CM | POA: Diagnosis not present

## 2020-09-15 LAB — TYPE AND SCREEN
ABO/RH(D): B POS
Antibody Screen: NEGATIVE

## 2020-09-15 SURGERY — ARTHROPLASTY, KNEE, TOTAL, USING IMAGELESS COMPUTER-ASSISTED NAVIGATION
Anesthesia: Choice | Site: Knee | Laterality: Left

## 2020-09-15 NOTE — Assessment & Plan Note (Signed)
BP Readings from Last 3 Encounters:  09/12/20 128/60  09/09/20 (!) 171/75  07/27/20 (!) 163/78   Stable, chronic.  Continue current medication.

## 2020-09-15 NOTE — Assessment & Plan Note (Signed)
No recent flares on allopuirinol

## 2020-09-15 NOTE — Progress Notes (Signed)
VIRTUAL VISIT Due to national recommendations of social distancing due to [place] 19, a virtual visit is felt to be most appropriate for this patient at this time.   I connected with the patient on 09/15/20 at 12:00 PM EDT by virtual telehealth platform and verified that I am speaking with the correct person using two identifiers.   I discussed the limitations, risks, security and privacy concerns of performing an evaluation and management service by  virtual telehealth platform and the availability of in person appointments. I also discussed with the patient that there may be a patient responsible charge related to this service. The patient expressed understanding and agreed to proceed.  Patient location: Home Provider Location: [facility] Participants: Gregory [name] and [name]   Chief Complaint  Patient presents with   Follow-up    History of Present Illness:  70 year old male presents for follow up.  He has an upcoming surgery TNK but this has been cancelled. Dr. Lyla Glassing On pre op screening Hg was found to be 8.5   No visual blood loss.  He started having issues with anemia in 2021 following NSTEMI and CABG. This was complicated by HIT. Plt 23  Hg nadir was 7.1 03/08/2019 in hospital.  Given platelets and prbc.. Hg returned to 10.2 by 04/09/2019  Saw Dr. Lindi Adie 04/2019 for HIT.  He stayed at this level until 12/18/2019 when hg decreased for unknown reason to 8.1  He was found to be iron deficient and started on iron, eval for GI blood low recommended ( but he did not comply)  Improved Hg , stopped iron given iron levels high in 05/2020    No CP,   continue SOB and fatigue. Hx of CAD  Reviewed OV from cardiology 09/12/2020  Plan 3 day monitor to assess bradycardia and possible progression of  symptoms of 2-1 heart block  Sleep apnea symptoms: He has been set up with sleep eval referral.   PAD followed by Vascular .. last OV Dr. Scot Dock 07/27/2020   Chronic  gout: On allopurinol.  COVID 19 screen No recent travel or known exposure to COVID19 The patient denies respiratory symptoms of COVID 19 at this time.  The importance of social distancing was discussed today.   Review of Systems  Constitutional:  Positive for malaise/fatigue. Negative for chills and fever.  HENT:  Negative for congestion and ear pain.   Eyes:  Negative for pain and redness.  Respiratory:  Positive for shortness of breath. Negative for cough.   Cardiovascular:  Negative for chest pain, palpitations and leg swelling.  Gastrointestinal:  Negative for abdominal pain, blood in stool, constipation, diarrhea, nausea and vomiting.  Genitourinary:  Negative for dysuria.  Musculoskeletal:  Positive for joint pain. Negative for falls and myalgias.  Skin:  Negative for rash.  Neurological:  Negative for dizziness.  Psychiatric/Behavioral:  Negative for depression. The patient is not nervous/anxious.      Past Medical History:  Diagnosis Date   Alcohol abuse    Anxiety    Aortic valve disease    AI and AS, Moderate   Arthritis    Chest pain [id number]   Complication of anesthesia    post op confusion   Coronary artery disease    Drug use    Dyspnea    Edema of both lower extremities    Glaucoma    Heart murmur    Hypertension    Myocardial infarction (H[facility])    OSA (obstructive sleep apnea)  Pre-diabetes    Shingles     reports that he has never smoked. He has never used smokeless tobacco. He reports current alcohol use of about 12.0 standard drinks per week. He reports current drug use. Frequency: 2.00 times per week. Drug: Marijuana.   Current Outpatient Medications:    allopurinol (ZYLOPRIM) 100 MG tablet, Take 1 tablet (100 mg total) by mouth daily., Disp: 30 tablet, Rfl: 6   ALPRAZolam (XANAX) 0.5 MG tablet, TAKE 1 TABLET BY MOUTH ONCE DAILY AS NEEDED FOR ANXIETY TAKE  30  MINUTES  PRIOR  TO  PLANE  FLIGHT  OR  AS  NEEDED  FOR  ANXIETY (Patient taking differently:  Take 0.5 mg by mouth daily as needed for anxiety.), Disp: 20 tablet, Rfl: 0   aspirin EC 81 MG tablet, Take 1 tablet (81 mg total) by mouth daily. Swallow whole., Disp: 30 tablet, Rfl: 11   atorvastatin (LIPITOR) 80 MG tablet, Take 1 tablet (80 mg total) by mouth daily., Disp: 90 tablet, Rfl: 3   colchicine 0.6 MG tablet, Take 2 tablets by mouth once daily, Disp: 60 tablet, Rfl: 5   furosemide (LASIX) 20 MG tablet, Take 1 tablet (20 mg total) by mouth daily., Disp: 90 tablet, Rfl: 3   guaiFENesin (MUCINEX) 600 MG 12 hr tablet, Take 600 mg by mouth 2 (two) times daily as needed for cough., Disp: , Rfl:    ketoconazole (NIZORAL) 2 % shampoo, Apply 1 application topically 2 (two) times a week., Disp: 120 mL, Rfl: 1   latanoprost (XALATAN) 0.005 % ophthalmic solution, Place 1 drop into both eyes every morning. , Disp: , Rfl:    losartan (COZAAR) 25 MG tablet, Take 25 mg by mouth daily., Disp: , Rfl:    metoprolol tartrate (LOPRESSOR) 25 MG tablet, Take 1/2 (one-half) tablet by mouth twice daily, Disp: 30 tablet, Rfl: 2   sertraline (ZOLOFT) 50 MG tablet, Take 1 tablet (50 mg total) by mouth daily., Disp: 90 tablet, Rfl: 3   traMADol (ULTRAM) 50 MG tablet, TAKE 1 TABLET BY MOUTH EVERY 4 HOURS AS NEEDED FOR PAIN (Patient taking differently: Take 50 mg by mouth every 4 (four) hours as needed for moderate pain.), Disp: 30 tablet, Rfl: 0   traZODone (DESYREL) 50 MG tablet, Take 0.5-1 tablets (25-50 mg total) by mouth at bedtime as needed for sleep., Disp: 30 tablet, Rfl: 3   Observations/Objective: Height 5' 6.5" (1.689 m).  Physical Exam  Physical Exam Constitutional:      General: The patient is not in acute distress. Pulmonary:     Effort: Pulmonary effort is normal. No respiratory distress.  Neurological:     Mental Status: The patient is alert and oriented to person, place, and time.  Psychiatric:        Mood and Affect: Mood normal.        Behavior: Behavior normal.   Assessment and  Plan    Problem List Items Addressed This Visit     Anemia - Primary    Unclear etiology.. referred for GI evaluation for GI loss. No visible blood loss.   He will return for  Recheck cbc and iron panel... has been off  Ferrous sulfate as iron levels were high.  He is symptomatic with fatigue and SOB.[name] but per pt no different than usual.  he is borderline for indication for transfusion given hx of  Pre-existing CAD. Referred back to Dr Lindi Adie for recommendations.      Relevant Orders   Ambulatory  referral to Gastroenterology   Ambulatory referral to Hematology / Oncology   IBC + Ferritin   CBC with Differential/Platelet   Chronic gout of multiple sites    No recent flares on allopuirinol      Essential hypertension    BP Readings from Last 3 Encounters:  09/12/20 128/60  09/09/20 (!) 171/75  07/27/20 (!) 163/78  Stable, chronic.  Continue current medication.           I discussed the assessment and treatment plan with the patient. The patient was provided an opportunity to ask questions and all were answered. The patient agreed with the plan and demonstrated an understanding of the instructions.   The patient was advised to call back or seek an in-person evaluation if the symptoms worsen or if the condition fails to improve as anticipated.     Gregory Lofts, MD

## 2020-09-15 NOTE — Assessment & Plan Note (Signed)
Unclear etiology.. referred for GI evaluation for GI loss. No visible blood loss.   He will return for  Recheck cbc and iron panel... has been off  Ferrous sulfate as iron levels were high.  He is symptomatic with fatigue and SOB.[name] but per pt no different than usual.  he is borderline for indication for transfusion given hx of  Pre-existing CAD. Referred back to Dr Lindi Adie for recommendations.

## 2020-09-15 NOTE — Patient Instructions (Addendum)
Call to make a lab appt for cbc and iron panel.  Dr. [id number]) [id number] Hematology  Due to there being a large influx of referrals the GI offices are currently scheduling appointments as far out as late Aug/Sept 2022. They have asked that you call their office regarding your referral and scheduling needs. Otherwise, they will reach out to you as soon as they are able. They are working diligently to get patients called and scheduled as soon as possible.  Colonoscopies will be called according to date/time of the referral entry as these are not of urgent need.    [facility]  [phone] [facility]  [phone] [facility]  [phone]  [facility] can be considered as well. They may be able to book sooner appointments. You can reach [facility] at [phone].   Thank you for your patience and please let us know if you have any questions or concerns.    Thank You!  - [place] [facility]

## 2020-09-16 ENCOUNTER — Other Ambulatory Visit: Payer: Self-pay

## 2020-09-16 ENCOUNTER — Other Ambulatory Visit: Payer: Medicare HMO

## 2020-09-16 ENCOUNTER — Other Ambulatory Visit (INDEPENDENT_AMBULATORY_CARE_PROVIDER_SITE_OTHER): Payer: Medicare HMO

## 2020-09-16 ENCOUNTER — Telehealth: Payer: Self-pay | Admitting: Hematology and Oncology

## 2020-09-16 DIAGNOSIS — D509 Iron deficiency anemia, unspecified: Secondary | ICD-10-CM | POA: Diagnosis not present

## 2020-09-16 LAB — CBC WITH DIFFERENTIAL/PLATELET
Basophils Absolute: 0 10*3/uL (ref 0.0–0.1)
Basophils Relative: 0.4 % (ref 0.0–3.0)
Eosinophils Absolute: 0.1 10*3/uL (ref 0.0–0.7)
Eosinophils Relative: 1 % (ref 0.0–5.0)
HCT: 28.6 % — ABNORMAL LOW (ref 39.0–52.0)
Hemoglobin: 8.8 g/dL — ABNORMAL LOW (ref 13.0–17.0)
Lymphocytes Relative: 20.9 % (ref 12.0–46.0)
Lymphs Abs: 1.2 10*3/uL (ref 0.7–4.0)
MCHC: 30.8 g/dL (ref 30.0–36.0)
MCV: 87.3 fl (ref 78.0–100.0)
Monocytes Absolute: 0.6 10*3/uL (ref 0.1–1.0)
Monocytes Relative: 10.4 % (ref 3.0–12.0)
Neutro Abs: 3.9 10*3/uL (ref 1.4–7.7)
Neutrophils Relative %: 67.3 % (ref 43.0–77.0)
Platelets: 237 10*3/uL (ref 150.0–400.0)
RBC: 3.28 Mil/uL — ABNORMAL LOW (ref 4.22–5.81)
RDW: 15.9 % — ABNORMAL HIGH (ref 11.5–15.5)
WBC: 5.7 10*3/uL (ref 4.0–10.5)

## 2020-09-16 LAB — IBC + FERRITIN
Ferritin: 14.1 ng/mL — ABNORMAL LOW (ref 22.0–322.0)
Iron: 25 ug/dL — ABNORMAL LOW (ref 42–165)
Saturation Ratios: 5.3 % — ABNORMAL LOW (ref 20.0–50.0)
TIBC: 471.8 ug/dL — ABNORMAL HIGH (ref 250.0–450.0)
Transferrin: 337 mg/dL (ref 212.0–360.0)

## 2020-09-16 NOTE — Telephone Encounter (Signed)
Scheduled appt per 9/15 referral. PT is aware of appt date and time.

## 2020-09-21 NOTE — Progress Notes (Signed)
Patient Care Team: Jinny Sanders, MD as PCP - General (Family Medicine) [name], MD as PCP - Cardiology (Cardiology) Lendon Colonel, NP as Nurse Practitioner (Cardiology)  DIAGNOSIS:    ICD-10-CM   1. HIT (heparin-induced thrombocytopenia) (HCC)  D75.82     2. Iron deficiency anemia, unspecified iron deficiency anemia type  D50.9       CHIEF COMPLIANT: Follow-up of heparin-induced thrombocytopenia   INTERVAL HISTORY: Gregory [name] is a 70 y.o. with above-mentioned history of Heparin-induced thrombocytopenia who is currently on anticoagulation with Coumadin. Labs on 09/16/2020 showed 3.28, Hgb 8.8, and HCT 28.6. He presents to the clinic today for follow-up.  Patient needs a knee replacement surgery and has been undergoing work-ups including evaluation of his heart function.  It appears that he feels short of breath very easily and he has been this way for the past year or so.  His baseline hemoglobin is around 10-10.5.  He has not seen any blood in the stool and has been referred to gastroenterology for further evaluation.  He does bruise extremely easily.  ALLERGIES:  is allergic to heparin.  MEDICATIONS:  Current Outpatient Medications  Medication Sig Dispense Refill   allopurinol (ZYLOPRIM) 100 MG tablet Take 1 tablet (100 mg total) by mouth daily. 30 tablet 6   ALPRAZolam (XANAX) 0.5 MG tablet TAKE 1 TABLET BY MOUTH ONCE DAILY AS NEEDED FOR ANXIETY TAKE  30  MINUTES  PRIOR  TO  PLANE  FLIGHT  OR  AS  NEEDED  FOR  ANXIETY (Patient taking differently: Take 0.5 mg by mouth daily as needed for anxiety.) 20 tablet 0   aspirin EC 81 MG tablet Take 1 tablet (81 mg total) by mouth daily. Swallow whole. 30 tablet 11   atorvastatin (LIPITOR) 80 MG tablet Take 1 tablet (80 mg total) by mouth daily. 90 tablet 3   colchicine 0.6 MG tablet Take 2 tablets by mouth once daily 60 tablet 5   furosemide (LASIX) 20 MG tablet Take 1 tablet (20 mg total) by mouth daily. 90 tablet 3    guaiFENesin (MUCINEX) 600 MG 12 hr tablet Take 600 mg by mouth 2 (two) times daily as needed for cough.     ketoconazole (NIZORAL) 2 % shampoo Apply 1 application topically 2 (two) times a week. 120 mL 1   latanoprost (XALATAN) 0.005 % ophthalmic solution Place 1 drop into both eyes every morning.      losartan (COZAAR) 25 MG tablet Take 25 mg by mouth daily.     metoprolol tartrate (LOPRESSOR) 25 MG tablet Take 1/2 (one-half) tablet by mouth twice daily 30 tablet 2   sertraline (ZOLOFT) 50 MG tablet Take 1 tablet (50 mg total) by mouth daily. 90 tablet 3   traMADol (ULTRAM) 50 MG tablet TAKE 1 TABLET BY MOUTH EVERY 4 HOURS AS NEEDED FOR PAIN (Patient taking differently: Take 50 mg by mouth every 4 (four) hours as needed for moderate pain.) 30 tablet 0   traZODone (DESYREL) 50 MG tablet Take 0.5-1 tablets (25-50 mg total) by mouth at bedtime as needed for sleep. 30 tablet 3   No current facility-administered medications for this visit.    PHYSICAL EXAMINATION: ECOG PERFORMANCE STATUS: 1 - Symptomatic but completely ambulatory  Vitals:   09/22/20 0817  BP: (!) 168/77  Pulse: 64  Resp: 18  Temp: 97.8 F (36.6 C)  SpO2: 95%   Filed Weights   09/22/20 0817  Weight: 242 lb 11.2 oz (110.1 kg)  LABORATORY DATA:  I have reviewed the data as listed CMP Latest Ref Rng & Units 09/09/2020 03/21/2020 12/18/2019  Glucose 70 - 99 mg/dL 112(H) 109(H) 104(H)  BUN 8 - 23 mg/dL 22 25(H) 18  Creatinine 0.61 - 1.24 mg/dL 0.76 0.87 0.90  Sodium 135 - 145 mmol/L 147(H) 140 138  Potassium 3.5 - 5.1 mmol/L 4.4 4.6 4.3  Chloride 98 - 111 mmol/L 111 105 104  CO2 22 - 32 mmol/L _0 Calcium 8.9 - 10.3 mg/dL 8.8(L) 9.4 8.9  Total Protein 6.5 - 8.1 g/dL 6.4(L) 6.4 6.6  Total Bilirubin 0.3 - 1.2 mg/dL 1.9(H) 1.6(H) 1.2  Alkaline Phos 38 - 126 U/L 43 43 49  AST 15 - 41 U/L 51(H) 47(H) 39(H)  ALT 0 - 44 U/L 38 20 15    Lab Results  Component Value Date   WBC 5.7 09/16/2020   HGB 8.8 Repeated and  verified X2. (L) 09/16/2020   HCT 28.6 (L) 09/16/2020   MCV 87.3 09/16/2020   PLT 237.0 09/16/2020   NEUTROABS 3.9 09/16/2020    ASSESSMENT & PLAN:  HIT (heparin-induced thrombocytopenia) (HCC) Normochromic normocytic anemia HIT was diagnosed when he was in the hospital with NSTEMI status post CABG on 03/06/2019.  Postoperatively platelet count went down to 17,000.  HIT antibody panel was positive. Current treatment: Coumadin completed 2021   Lab review: 04/15/2019: Platelets 93 03/26/2019: Platelet count 31 04/16/2019 ultrasound left arm: Left forearm hematoma 05/07/2019: Platelets 150 09/16/2020: Platelets 237, hemoglobin 8.8, MCV 87.3, ferritin 14.1, TIBC 471.8, iron saturation 5.3%  Normocytic anemia: Patient's baseline hemoglobin is around 10 g.  In December 2021 it dropped to 7.8 g and the most recent lab work on 09/16/2020 his hemoglobin was 8.8 g.  He took oral iron therapy from January 2021 until March 2021 when iron saturation increased iron was discontinued.  Currently he is on 1 tablet of oral iron daily.   Based on low iron parameters, recommended IV iron therapy. We will obtain labs today to rule out other causes of normocytic anemia.  If the hemoglobin does not respond, we may have to consider additional work-up and bone marrow biopsy. Return to clinic in 8 weeks for labs and follow-up 2 days later with a MyChart virtual visit to discuss results.   No orders of the defined types were placed in this encounter.  The patient has a good understanding of the overall plan. he agrees with it. he will call with any problems that may develop before the next visit here.  Total time spent: 30 mins including face to face time and time spent for planning, charting and coordination of care  Rulon Eisenmenger, MD, MPH 09/22/2020  I, Thana Ates, am acting as scribe for Dr. Nicholas Lose.  I have reviewed the above documentation for accuracy and completeness, and I agree with the  above.

## 2020-09-22 ENCOUNTER — Inpatient Hospital Stay: Payer: Medicare HMO

## 2020-09-22 ENCOUNTER — Inpatient Hospital Stay: Payer: Medicare HMO | Attending: Hematology and Oncology | Admitting: Hematology and Oncology

## 2020-09-22 ENCOUNTER — Other Ambulatory Visit: Payer: Self-pay

## 2020-09-22 VITALS — BP 168/77 | HR 64 | Temp 97.8°F | Resp 18 | Ht 66.5 in | Wt 242.7 lb

## 2020-09-22 DIAGNOSIS — Z79899 Other long term (current) drug therapy: Secondary | ICD-10-CM | POA: Diagnosis not present

## 2020-09-22 DIAGNOSIS — I252 Old myocardial infarction: Secondary | ICD-10-CM | POA: Insufficient documentation

## 2020-09-22 DIAGNOSIS — D7582 Heparin induced thrombocytopenia (HIT): Secondary | ICD-10-CM | POA: Diagnosis not present

## 2020-09-22 DIAGNOSIS — D75829 Heparin-induced thrombocytopenia, unspecified: Secondary | ICD-10-CM

## 2020-09-22 DIAGNOSIS — D509 Iron deficiency anemia, unspecified: Secondary | ICD-10-CM | POA: Insufficient documentation

## 2020-09-22 DIAGNOSIS — Z7901 Long term (current) use of anticoagulants: Secondary | ICD-10-CM | POA: Insufficient documentation

## 2020-09-22 DIAGNOSIS — D649 Anemia, unspecified: Secondary | ICD-10-CM

## 2020-09-22 DIAGNOSIS — R0602 Shortness of breath: Secondary | ICD-10-CM | POA: Insufficient documentation

## 2020-09-22 LAB — CBC WITH DIFFERENTIAL (CANCER CENTER ONLY)
Abs Immature Granulocytes: 0.02 10*3/uL (ref 0.00–0.07)
Basophils Absolute: 0 10*3/uL (ref 0.0–0.1)
Basophils Relative: 0 %
Eosinophils Absolute: 0.1 10*3/uL (ref 0.0–0.5)
Eosinophils Relative: 1 %
HCT: 28.7 % — ABNORMAL LOW (ref 39.0–52.0)
Hemoglobin: 8.5 g/dL — ABNORMAL LOW (ref 13.0–17.0)
Immature Granulocytes: 0 %
Lymphocytes Relative: 19 %
Lymphs Abs: 1.2 10*3/uL (ref 0.7–4.0)
MCH: 26.9 pg (ref 26.0–34.0)
MCHC: 29.6 g/dL — ABNORMAL LOW (ref 30.0–36.0)
MCV: 90.8 fL (ref 80.0–100.0)
Monocytes Absolute: 0.5 10*3/uL (ref 0.1–1.0)
Monocytes Relative: 8 %
Neutro Abs: 4.3 10*3/uL (ref 1.7–7.7)
Neutrophils Relative %: 72 %
Platelet Count: 242 10*3/uL (ref 150–400)
RBC: 3.16 MIL/uL — ABNORMAL LOW (ref 4.22–5.81)
RDW: 15.5 % (ref 11.5–15.5)
WBC Count: 6.1 10*3/uL (ref 4.0–10.5)
nRBC: 0 % (ref 0.0–0.2)

## 2020-09-22 LAB — DIRECT ANTIGLOBULIN TEST (NOT AT ARMC)
DAT, IgG: NEGATIVE
DAT, complement: NEGATIVE

## 2020-09-22 LAB — LACTATE DEHYDROGENASE: LDH: 1004 U/L — ABNORMAL HIGH (ref 98–192)

## 2020-09-22 LAB — VITAMIN B12: Vitamin B-12: 246 pg/mL (ref 180–914)

## 2020-09-22 LAB — RETIC PANEL
Immature Retic Fract: 28.5 % — ABNORMAL HIGH (ref 2.3–15.9)
RBC.: 3.21 MIL/uL — ABNORMAL LOW (ref 4.22–5.81)
Retic Count, Absolute: 122 10*3/uL (ref 19.0–186.0)
Retic Ct Pct: 3.8 % — ABNORMAL HIGH (ref 0.4–3.1)
Reticulocyte Hemoglobin: 23.9 pg — ABNORMAL LOW (ref >27.9)

## 2020-09-22 LAB — FOLATE: Folate: 10.6 ng/mL (ref >5.9)

## 2020-09-22 NOTE — Assessment & Plan Note (Signed)
Diagnosed when he was in the hospital with NSTEMI status post CABG on 03/06/2019. Postoperatively platelet count went down to 17,000. HIT antibody panel was positive. Current treatment: Coumadincompleted 2021  Lab review: 04/15/2019: Platelets 93 03/26/2019: Platelet count 31 04/16/2019 ultrasound left arm: Left forearm hematoma 05/07/2019: Platelets 150 09/16/2020: Platelets 237, hemoglobin 8.8, MCV 87.3, ferritin 14.1, TIBC 471.8, iron saturation 5.3%  Normocytic anemia: Patient's baseline hemoglobin is around 10 g.  In December 2021 it dropped to 7.8 g and the most recent lab work on 09/16/2020 his hemoglobin was 8.8 g.  Based on low iron parameters, recommended IV iron therapy. If the hemoglobin does not respond, we may have to consider additional work-up and bone marrow biopsy.

## 2020-09-22 NOTE — Addendum Note (Signed)
Addended by: Sharlynn Oliphant A on: 09/22/2020 09:15 AM   Modules accepted: Orders

## 2020-09-23 ENCOUNTER — Telehealth: Payer: Self-pay

## 2020-09-23 ENCOUNTER — Encounter: Payer: Self-pay | Admitting: Hematology and Oncology

## 2020-09-23 LAB — PROSTATE-SPECIFIC AG, SERUM (LABCORP): Prostate Specific Ag, Serum: 0.2 ng/mL (ref 0.0–4.0)

## 2020-09-23 LAB — ERYTHROPOIETIN: Erythropoietin: 235.3 m[IU]/mL — ABNORMAL HIGH (ref 2.6–18.5)

## 2020-09-23 NOTE — Telephone Encounter (Signed)
Spoke with pt's wife regarding PSA results. Pt's wife verbalized thanks and understanding.

## 2020-09-26 LAB — MULTIPLE MYELOMA PANEL, SERUM
Albumin SerPl Elph-Mcnc: 3.7 g/dL (ref 2.9–4.4)
Albumin/Glob SerPl: 1.7 (ref 0.7–1.7)
Alpha 1: 0.2 g/dL (ref 0.0–0.4)
Alpha2 Glob SerPl Elph-Mcnc: 0.4 g/dL (ref 0.4–1.0)
B-Globulin SerPl Elph-Mcnc: 0.9 g/dL (ref 0.7–1.3)
Gamma Glob SerPl Elph-Mcnc: 0.6 g/dL (ref 0.4–1.8)
Globulin, Total: 2.2 g/dL (ref 2.2–3.9)
IgA: 202 mg/dL (ref 61–437)
IgG (Immunoglobin G), Serum: 572 mg/dL — ABNORMAL LOW (ref 603–1613)
IgM (Immunoglobulin M), Srm: 75 mg/dL (ref 15–143)
Total Protein ELP: 5.9 g/dL — ABNORMAL LOW (ref 6.0–8.5)

## 2020-09-27 ENCOUNTER — Telehealth: Payer: Self-pay | Admitting: Hematology and Oncology

## 2020-09-27 ENCOUNTER — Encounter: Payer: Self-pay | Admitting: Hematology and Oncology

## 2020-09-27 DIAGNOSIS — I480 Paroxysmal atrial fibrillation: Secondary | ICD-10-CM | POA: Diagnosis not present

## 2020-09-27 NOTE — Telephone Encounter (Signed)
By the consent from the patient, I called his wife Jenny Reichmann and discussed the results of the erythropoietin.  His erythropoietin came up as to 35 which is markedly elevated.  I informed his wife that it is fairly normal for the erythropoietin to be elevated when someone is profoundly anemic.  It is likely to come down once his hemoglobin has improved.

## 2020-09-27 NOTE — Telephone Encounter (Signed)
[name], MD  You 1 minute ago (3:22 PM)   I called the patient today.  I was able to look at his monitor and he has significant bradycardia and will need a pacemaker.  I would like for him to have an elective admission today or tomorrow so that I can check an echo, diurese him and get him set up for a pacemaker.  He wants to talk this over with his wife and promises to call back tomorrow.

## 2020-09-28 ENCOUNTER — Encounter (HOSPITAL_COMMUNITY): Payer: Self-pay | Admitting: Cardiology

## 2020-09-28 ENCOUNTER — Inpatient Hospital Stay (HOSPITAL_COMMUNITY)
Admission: AD | Admit: 2020-09-28 | Discharge: 2020-10-02 | DRG: 291 | Disposition: A | Discharge status: Home / Self Care | Payer: Medicare HMO | Source: Ambulatory Visit | Attending: Cardiology | Admitting: Cardiology

## 2020-09-28 ENCOUNTER — Other Ambulatory Visit: Payer: Self-pay

## 2020-09-28 ENCOUNTER — Telehealth: Payer: Self-pay | Admitting: Cardiology

## 2020-09-28 ENCOUNTER — Encounter: Payer: Self-pay | Admitting: Hematology and Oncology

## 2020-09-28 DIAGNOSIS — R0602 Shortness of breath: Secondary | ICD-10-CM | POA: Diagnosis not present

## 2020-09-28 DIAGNOSIS — Z953 Presence of xenogenic heart valve: Secondary | ICD-10-CM | POA: Diagnosis not present

## 2020-09-28 DIAGNOSIS — E876 Hypokalemia: Secondary | ICD-10-CM | POA: Diagnosis present

## 2020-09-28 DIAGNOSIS — I255 Ischemic cardiomyopathy: Secondary | ICD-10-CM | POA: Diagnosis present

## 2020-09-28 DIAGNOSIS — D649 Anemia, unspecified: Secondary | ICD-10-CM | POA: Diagnosis not present

## 2020-09-28 DIAGNOSIS — R0609 Other forms of dyspnea: Secondary | ICD-10-CM | POA: Diagnosis not present

## 2020-09-28 DIAGNOSIS — R7303 Prediabetes: Secondary | ICD-10-CM | POA: Diagnosis present

## 2020-09-28 DIAGNOSIS — D509 Iron deficiency anemia, unspecified: Secondary | ICD-10-CM | POA: Diagnosis not present

## 2020-09-28 DIAGNOSIS — I5021 Acute systolic (congestive) heart failure: Secondary | ICD-10-CM

## 2020-09-28 DIAGNOSIS — Z952 Presence of prosthetic heart valve: Secondary | ICD-10-CM

## 2020-09-28 DIAGNOSIS — D539 Nutritional anemia, unspecified: Secondary | ICD-10-CM | POA: Diagnosis present

## 2020-09-28 DIAGNOSIS — Z79899 Other long term (current) drug therapy: Secondary | ICD-10-CM | POA: Diagnosis not present

## 2020-09-28 DIAGNOSIS — I5023 Acute on chronic systolic (congestive) heart failure: Secondary | ICD-10-CM | POA: Diagnosis not present

## 2020-09-28 DIAGNOSIS — T8209XA Other mechanical complication of heart valve prosthesis, initial encounter: Secondary | ICD-10-CM

## 2020-09-28 DIAGNOSIS — R0902 Hypoxemia: Secondary | ICD-10-CM | POA: Diagnosis present

## 2020-09-28 DIAGNOSIS — I110 Hypertensive heart disease with heart failure: Secondary | ICD-10-CM | POA: Diagnosis not present

## 2020-09-28 DIAGNOSIS — T8203XS Leakage of heart valve prosthesis, sequela: Secondary | ICD-10-CM | POA: Diagnosis not present

## 2020-09-28 DIAGNOSIS — I509 Heart failure, unspecified: Secondary | ICD-10-CM | POA: Diagnosis not present

## 2020-09-28 DIAGNOSIS — J9811 Atelectasis: Secondary | ICD-10-CM | POA: Diagnosis not present

## 2020-09-28 DIAGNOSIS — I517 Cardiomegaly: Secondary | ICD-10-CM | POA: Diagnosis not present

## 2020-09-28 DIAGNOSIS — I252 Old myocardial infarction: Secondary | ICD-10-CM | POA: Diagnosis not present

## 2020-09-28 DIAGNOSIS — I442 Atrioventricular block, complete: Secondary | ICD-10-CM | POA: Diagnosis not present

## 2020-09-28 DIAGNOSIS — I4891 Unspecified atrial fibrillation: Secondary | ICD-10-CM | POA: Diagnosis present

## 2020-09-28 DIAGNOSIS — I10 Essential (primary) hypertension: Secondary | ICD-10-CM | POA: Diagnosis not present

## 2020-09-28 DIAGNOSIS — J90 Pleural effusion, not elsewhere classified: Secondary | ICD-10-CM | POA: Diagnosis not present

## 2020-09-28 DIAGNOSIS — I2510 Atherosclerotic heart disease of native coronary artery without angina pectoris: Secondary | ICD-10-CM | POA: Diagnosis present

## 2020-09-28 DIAGNOSIS — I351 Nonrheumatic aortic (valve) insufficiency: Secondary | ICD-10-CM | POA: Diagnosis not present

## 2020-09-28 DIAGNOSIS — I5031 Acute diastolic (congestive) heart failure: Secondary | ICD-10-CM | POA: Diagnosis not present

## 2020-09-28 DIAGNOSIS — Z20822 Contact with and (suspected) exposure to covid-19: Secondary | ICD-10-CM | POA: Diagnosis not present

## 2020-09-28 DIAGNOSIS — R0600 Dyspnea, unspecified: Secondary | ICD-10-CM

## 2020-09-28 DIAGNOSIS — Z951 Presence of aortocoronary bypass graft: Secondary | ICD-10-CM

## 2020-09-28 DIAGNOSIS — R001 Bradycardia, unspecified: Secondary | ICD-10-CM | POA: Diagnosis present

## 2020-09-28 DIAGNOSIS — Z7982 Long term (current) use of aspirin: Secondary | ICD-10-CM | POA: Diagnosis not present

## 2020-09-28 DIAGNOSIS — K5730 Diverticulosis of large intestine without perforation or abscess without bleeding: Secondary | ICD-10-CM | POA: Diagnosis not present

## 2020-09-28 DIAGNOSIS — I712 Thoracic aortic aneurysm, without rupture: Secondary | ICD-10-CM | POA: Diagnosis not present

## 2020-09-28 DIAGNOSIS — K8020 Calculus of gallbladder without cholecystitis without obstruction: Secondary | ICD-10-CM | POA: Diagnosis not present

## 2020-09-28 DIAGNOSIS — I5043 Acute on chronic combined systolic (congestive) and diastolic (congestive) heart failure: Secondary | ICD-10-CM | POA: Diagnosis not present

## 2020-09-28 DIAGNOSIS — E7800 Pure hypercholesterolemia, unspecified: Secondary | ICD-10-CM | POA: Diagnosis present

## 2020-09-28 LAB — CBC WITH DIFFERENTIAL/PLATELET
Abs Immature Granulocytes: 0.02 10*3/uL (ref 0.00–0.07)
Basophils Absolute: 0 10*3/uL (ref 0.0–0.1)
Basophils Relative: 0 %
Eosinophils Absolute: 0.1 10*3/uL (ref 0.0–0.5)
Eosinophils Relative: 1 %
HCT: 28.7 % — ABNORMAL LOW (ref 39.0–52.0)
Hemoglobin: 8.2 g/dL — ABNORMAL LOW (ref 13.0–17.0)
Immature Granulocytes: 0 %
Lymphocytes Relative: 26 %
Lymphs Abs: 1.5 10*3/uL (ref 0.7–4.0)
MCH: 26.6 pg (ref 26.0–34.0)
MCHC: 28.6 g/dL — ABNORMAL LOW (ref 30.0–36.0)
MCV: 93.2 fL (ref 80.0–100.0)
Monocytes Absolute: 0.6 10*3/uL (ref 0.1–1.0)
Monocytes Relative: 10 %
Neutro Abs: 3.7 10*3/uL (ref 1.7–7.7)
Neutrophils Relative %: 63 %
Platelets: 223 10*3/uL (ref 150–400)
RBC: 3.08 MIL/uL — ABNORMAL LOW (ref 4.22–5.81)
RDW: 17.3 % — ABNORMAL HIGH (ref 11.5–15.5)
WBC: 5.9 10*3/uL (ref 4.0–10.5)
nRBC: 0 % (ref 0.0–0.2)

## 2020-09-28 LAB — MAGNESIUM: Magnesium: 1.9 mg/dL (ref 1.7–2.4)

## 2020-09-28 LAB — COMPREHENSIVE METABOLIC PANEL
ALT: 24 U/L (ref 0–44)
AST: 43 U/L — ABNORMAL HIGH (ref 15–41)
Albumin: 3.3 g/dL — ABNORMAL LOW (ref 3.5–5.0)
Alkaline Phosphatase: 41 U/L (ref 38–126)
Anion gap: 7 (ref 5–15)
BUN: 10 mg/dL (ref 8–23)
CO2: 28 mmol/L (ref 22–32)
Calcium: 8.3 mg/dL — ABNORMAL LOW (ref 8.9–10.3)
Chloride: 104 mmol/L (ref 98–111)
Creatinine, Ser: 0.75 mg/dL (ref 0.61–1.24)
GFR, Estimated: >60 mL/min (ref >60)
Glucose, Bld: 102 mg/dL — ABNORMAL HIGH (ref 70–99)
Potassium: 3 mmol/L — ABNORMAL LOW (ref 3.5–5.1)
Sodium: 139 mmol/L (ref 135–145)
Total Bilirubin: 1.9 mg/dL — ABNORMAL HIGH (ref 0.3–1.2)
Total Protein: 5.3 g/dL — ABNORMAL LOW (ref 6.5–8.1)

## 2020-09-28 LAB — TSH: TSH: 1.62 u[IU]/mL (ref 0.350–4.500)

## 2020-09-28 LAB — BRAIN NATRIURETIC PEPTIDE: B Natriuretic Peptide: 2498.6 pg/mL — ABNORMAL HIGH (ref 0.0–100.0)

## 2020-09-28 MED ORDER — ONDANSETRON HCL 4 MG/2ML IJ SOLN: 4.0000 mg | Freq: Four times a day (QID) | INTRAMUSCULAR | Status: DC | PRN

## 2020-09-28 MED ORDER — LOSARTAN POTASSIUM 25 MG PO TABS: 25.0000 mg | ORAL_TABLET | Freq: Every day | ORAL | Status: DC

## 2020-09-28 MED ORDER — ACETAMINOPHEN 325 MG PO TABS: 650.0000 mg | ORAL_TABLET | ORAL | Status: DC | PRN

## 2020-09-28 MED ORDER — SACUBITRIL-VALSARTAN 24-26 MG PO TABS
1.0000 | ORAL_TABLET | Freq: Two times a day (BID) | ORAL | Status: DC
  Administered 2020-09-28 – 2020-10-01 (×6): 1 via ORAL
  Filled 2020-09-28 (×6): qty 1

## 2020-09-28 MED ORDER — TRAMADOL HCL 50 MG PO TABS
50.0000 mg | ORAL_TABLET | ORAL | Status: DC | PRN
Start: 2020-09-28 — End: 2020-10-02

## 2020-09-28 MED ORDER — ALPRAZOLAM 0.5 MG PO TABS
0.5000 mg | ORAL_TABLET | Freq: Once | ORAL | Status: AC
  Administered 2020-09-28: 0.5 mg via ORAL
  Filled 2020-09-28: qty 1

## 2020-09-28 MED ORDER — ATORVASTATIN CALCIUM 80 MG PO TABS
80.0000 mg | ORAL_TABLET | Freq: Every day | ORAL | Status: DC
  Administered 2020-09-29 – 2020-10-02 (×4): 80 mg via ORAL
  Filled 2020-09-28 (×4): qty 1

## 2020-09-28 MED ORDER — SODIUM CHLORIDE 0.9% FLUSH: 3.0000 mL | INTRAVENOUS | Status: DC | PRN

## 2020-09-28 MED ORDER — ALLOPURINOL 100 MG PO TABS
100.0000 mg | ORAL_TABLET | Freq: Every day | ORAL | Status: DC
  Administered 2020-09-29 – 2020-10-02 (×4): 100 mg via ORAL
  Filled 2020-09-28 (×4): qty 1

## 2020-09-28 MED ORDER — SODIUM CHLORIDE 0.9% FLUSH
3.0000 mL | Freq: Two times a day (BID) | INTRAVENOUS | Status: DC
  Administered 2020-09-28 – 2020-10-02 (×8): 3 mL via INTRAVENOUS

## 2020-09-28 MED ORDER — TRAZODONE HCL 50 MG PO TABS: 25.0000 mg | ORAL_TABLET | Freq: Every evening | ORAL | Status: DC | PRN

## 2020-09-28 MED ORDER — FUROSEMIDE 10 MG/ML IJ SOLN
40.0000 mg | Freq: Two times a day (BID) | INTRAMUSCULAR | Status: DC
  Administered 2020-09-28 – 2020-10-02 (×8): 40 mg via INTRAVENOUS
  Filled 2020-09-28 (×8): qty 4

## 2020-09-28 MED ORDER — SERTRALINE HCL 50 MG PO TABS
50.0000 mg | ORAL_TABLET | Freq: Every day | ORAL | Status: DC
  Administered 2020-09-29 – 2020-10-02 (×4): 50 mg via ORAL
  Filled 2020-09-28 (×4): qty 1

## 2020-09-28 MED ORDER — ASPIRIN EC 81 MG PO TBEC
81.0000 mg | DELAYED_RELEASE_TABLET | Freq: Every day | ORAL | Status: DC
  Administered 2020-09-29 – 2020-10-02 (×4): 81 mg via ORAL
  Filled 2020-09-28 (×4): qty 1

## 2020-09-28 MED ORDER — SODIUM CHLORIDE 0.9 % IV SOLN: 250.0000 mL | INTRAVENOUS | Status: DC | PRN

## 2020-09-28 NOTE — Telephone Encounter (Signed)
Received a call from patient's wife.Patient has decided he will go to hospital.Bed management called left message on their voice mail patient needs to be admitted to [place] for diuresing,set up for a pacemaker,echocardiogram.Trish notified.

## 2020-09-28 NOTE — Plan of Care (Signed)
Problem: Education: Goal: Knowledge of General Education information will improve Description: Including pain rating scale, medication(s)/side effects and non-pharmacologic comfort measures 09/28/2020 1727 by Raelyn Number, RN Outcome: Progressing 09/28/2020 1550 by Raelyn Number, RN Outcome: Progressing   Problem: Health Behavior/Discharge Planning: Goal: Ability to manage health-related needs will improve 09/28/2020 1727 by Raelyn Number, RN Outcome: Progressing 09/28/2020 1550 by Raelyn Number, RN Outcome: Progressing   Problem: Clinical Measurements: Goal: Ability to maintain clinical measurements within normal limits will improve 09/28/2020 1727 by Raelyn Number, RN Outcome: Progressing 09/28/2020 1550 by Raelyn Number, RN Outcome: Progressing Goal: Will remain free from infection 09/28/2020 1727 by Raelyn Number, RN Outcome: Progressing 09/28/2020 1550 by Raelyn Number, RN Outcome: Progressing Goal: Diagnostic test results will improve 09/28/2020 1727 by Raelyn Number, RN Outcome: Progressing 09/28/2020 1550 by Raelyn Number, RN Outcome: Progressing Goal: Respiratory complications will improve 09/28/2020 1727 by Raelyn Number, RN Outcome: Progressing 09/28/2020 1550 by Raelyn Number, RN Outcome: Progressing Goal: Cardiovascular complication will be avoided 09/28/2020 1727 by Raelyn Number, RN Outcome: Progressing 09/28/2020 1550 by Raelyn Number, RN Outcome: Progressing   Problem: Activity: Goal: Risk for activity intolerance will decrease 09/28/2020 1727 by Raelyn Number, RN Outcome: Progressing 09/28/2020 1550 by Raelyn Number, RN Outcome: Progressing   Problem: Nutrition: Goal: Adequate nutrition will be maintained 09/28/2020 1727 by Raelyn Number, RN Outcome: Progressing 09/28/2020 1550 by Raelyn Number, RN Outcome: Progressing   Problem: Coping: Goal: Level of anxiety will decrease 09/28/2020 1727 by Raelyn Number, RN Outcome: Progressing 09/28/2020 1550 by Raelyn Number, RN Outcome: Progressing   Problem: Elimination: Goal: Will not experience complications related to bowel motility 09/28/2020 1727 by Raelyn Number, RN Outcome: Progressing 09/28/2020 1550 by Raelyn Number, RN Outcome: Progressing Goal: Will not experience complications related to urinary retention 09/28/2020 1727 by Raelyn Number, RN Outcome: Progressing 09/28/2020 1550 by Raelyn Number, RN Outcome: Progressing   Problem: Pain Managment: Goal: General experience of comfort will improve 09/28/2020 1727 by Raelyn Number, RN Outcome: Progressing 09/28/2020 1550 by Raelyn Number, RN Outcome: Progressing   Problem: Safety: Goal: Ability to remain free from injury will improve 09/28/2020 1727 by Raelyn Number, RN Outcome: Progressing 09/28/2020 1550 by Raelyn Number, RN Outcome: Progressing   Problem: Skin Integrity: Goal: Risk for impaired skin integrity will decrease 09/28/2020 1727 by Raelyn Number, RN Outcome: Progressing 09/28/2020 1550 by Raelyn Number, RN Outcome: Progressing   Problem: Education: Goal: Knowledge of cardiac device and self-care will improve 09/28/2020 1727 by Raelyn Number, RN Outcome: Progressing 09/28/2020 1550 by Raelyn Number, RN Outcome: Progressing Goal: Ability to safely manage health related needs after discharge will improve 09/28/2020 1727 by Raelyn Number, RN Outcome: Progressing 09/28/2020 1550 by Raelyn Number, RN Outcome: Progressing Goal: Individualized Educational Video(s) 09/28/2020 1727 by Raelyn Number, RN Outcome: Progressing 09/28/2020 1550 by Raelyn Number, RN Outcome: Progressing   Problem: Cardiac: Goal: Ability to achieve and maintain adequate cardiopulmonary perfusion will improve 09/28/2020 1727 by Raelyn Number, RN Outcome: Progressing 09/28/2020 1550 by Raelyn Number, RN Outcome: Progressing   Problem:  Education: Goal: Ability to demonstrate management of disease process will improve Outcome: Progressing Goal: Ability to verbalize understanding of medication therapies will improve Outcome: Progressing Goal: Individualized Educational Video(s) Outcome: Progressing   Problem: Activity: Goal: Capacity to carry out activities will improve Outcome:  Progressing   Problem: Cardiac: Goal: Ability to achieve and maintain adequate cardiopulmonary perfusion will improve Outcome: Progressing

## 2020-09-28 NOTE — Telephone Encounter (Signed)
Patient's wife was calling back because they was instructed by Dr. Azzie Roup to call back this morning. Please advise

## 2020-09-28 NOTE — Plan of Care (Signed)
  Problem: Education: Goal: Knowledge of General Education information will improve Description: Including pain rating scale, medication(s)/side effects and non-pharmacologic comfort measures Outcome: Progressing   Problem: Health Behavior/Discharge Planning: Goal: Ability to manage health-related needs will improve Outcome: Progressing   Problem: Clinical Measurements: Goal: Ability to maintain clinical measurements within normal limits will improve Outcome: Progressing Goal: Will remain free from infection Outcome: Progressing Goal: Diagnostic test results will improve Outcome: Progressing Goal: Respiratory complications will improve Outcome: Progressing Goal: Cardiovascular complication will be avoided Outcome: Progressing   Problem: Activity: Goal: Risk for activity intolerance will decrease Outcome: Progressing   Problem: Nutrition: Goal: Adequate nutrition will be maintained Outcome: Progressing   Problem: Coping: Goal: Level of anxiety will decrease Outcome: Progressing   Problem: Elimination: Goal: Will not experience complications related to bowel motility Outcome: Progressing Goal: Will not experience complications related to urinary retention Outcome: Progressing   Problem: Pain Managment: Goal: General experience of comfort will improve Outcome: Progressing   Problem: Safety: Goal: Ability to remain free from injury will improve Outcome: Progressing   Problem: Skin Integrity: Goal: Risk for impaired skin integrity will decrease Outcome: Progressing   Problem: Education: Goal: Knowledge of cardiac device and self-care will improve Outcome: Progressing Goal: Ability to safely manage health related needs after discharge will improve Outcome: Progressing Goal: Individualized Educational Video(s) Outcome: Progressing   Problem: Cardiac: Goal: Ability to achieve and maintain adequate cardiopulmonary perfusion will improve Outcome: Progressing   

## 2020-09-28 NOTE — Progress Notes (Signed)
Pt requested Xanax for night time, stated he takes it at home everyday. Pt stated he has white coat syndrome and stays anxious. On call MD Dr. Clayton Bibles paged. See MAR for new order. Will continue to monitor.

## 2020-09-28 NOTE — Progress Notes (Signed)
Patient admitted to 4E from MD office. VS are stable. CHG bath given. Pt is A/O x4. Telemetry applied. Oriented to room and call light within reach.  Raelyn Number, RN 09/28/20 3:43 PM

## 2020-09-28 NOTE — H&P (Signed)
CARDIOLOGY ADMISSION NOTE  Patient ID: [name] MRN: [id number] DOB/AGE: November 12, 1950 70 y.o.  Admit date: 09/28/2020 Primary Physician   Jinny Sanders, MD Primary Cardiologist   [name], MD Chief Complaint    SOB, Edema  HPI:   Gregory [name] is a 70 y.o. male for follow up of CAD/CABG and AVR.   He had a cardiac catheterization during recent hospitalization on 03/04/2019 which revealed severe calcific disease in the mid LAD, the circumflex had mild to moderate proximal stenosis, the RCA, a large dominant vessel, had severe proximal stenosis and severe mid stenosis with poor flow into the distal vessel.  He was also found to have severe aortic regurgitation and moderate aortic valve stenosis, with a mean gradient across aortic valve 10mmHg with AVA, but judged to be severe aortic insufficiency in the setting of dilated aortic root with high flow from AI leading to elevated gradient across aortic valve.  He underwent coronary artery bypass graft with aortic valve replacement on 03/06/2019.  The patient had LIMA to LAD, left radial artery to PDA, predicted RIMA to the right posterior lateral artery, with open left radial artery harvest, bilateral IMA harvesting.  Also, 25 mm [name] Intuity Bovine bioprosthetic aortic valve placement.   Of note, the patient had a severe reaction to heparin with HIT and severe profound thrombocytopenia postoperatively. He had postoperative atrial fibrillation and therefore was treated for a while with warfarin and amiodarone.  He did have a left arm hematoma.  Started on amiodarone 200 mg daily.  He had a left arm hematoma and an elevated INR.    Of note on a follow an echo he had a small perivalvular leak.  His aortic valve replacement was stable although his ejection fraction was 45%.  He was to have follow-up of this later this year.   He was to have  knee surgery.  However, this was recently canceled because he was found to be anemic.  He has had  no active bleeding that he knows of.  He had previous iron deficiency anemia.  He has since seen Dr. Lindi Adie who was planning iron and actually will plan on giving him iron infusion this admission.  Etiology of his iron deficiency is not clear.  I do see that he is got a elevated LDH.  His liver enzymes are fairly unremarkable.    When I last saw the patient he was having some increased swelling.  He was bradycardic with 2-1 heart block.  I put a monitor on him which shows high degree heart block with occasional junctional beats.  He is on a very low-dose beta-blocker and has baseline conduction disturbance.  I called him to discuss this yesterday.  He has been having increased swelling in his lower extremities.  He has gained about 6 pounds.  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  However, his wife says that he has been more short of breath just getting dressed.  He is not describing classic PND or orthopnea but he does get up and sleep in a recliner.  He feels anxious.  He is not having any presyncope or syncope.  He is not really feeling his heart racing or skipping.  He does acknowledge the increased edema.  He has had a little abdominal distention as well.  I called him to convince him to come into the hospital for diuresis, echocardiography follow-up his LV dysfunction and his aortic valve replacement as well  as EP consultation for permanent pacemaker placement.  Past Medical History:  Diagnosis Date   Alcohol abuse    Anxiety    Aortic valve disease    AI and AS, Moderate   Arthritis    Chest pain 52/7782   Complication of anesthesia    post op confusion   Coronary artery disease    Drug use    Dyspnea    Edema of both lower extremities    Glaucoma    Heart murmur    Hypertension    Myocardial infarction (HCC)    OSA (obstructive sleep apnea)    Pre-diabetes    Shingles     Past Surgical History:  Procedure Laterality Date   AORTIC VALVE  REPLACEMENT N/A 03/06/2019   Procedure: AORTIC VALVE REPLACEMENT (AVR), USING INTUITY 25MM;  Surgeon: Wonda Olds, MD;  Location: [place];  Service: Open Heart Surgery;  Laterality: N/A;   CARDIAC CATHETERIZATION     CATARACT EXTRACTION     CORONARY ARTERY BYPASS GRAFT N/A 03/06/2019   Procedure: CORONARY ARTERY BYPASS GRAFTING (CABG), ON PUMP, TIMES THREE, USING BILATERAL INTERNAL MAMMARIES AND LEFT RADIAL ARTERY HARVEST;  Surgeon: Wonda Olds, MD;  Location: [place];  Service: Open Heart Surgery;  Laterality: N/A;  BILATERAL IMA   LEFT HEART CATH AND CORONARY ANGIOGRAPHY N/A 03/04/2019   Procedure: LEFT HEART CATH AND CORONARY ANGIOGRAPHY;  Surgeon: Burnell Blanks, MD;  Location: [place] [facility];  Service: Cardiovascular;  Laterality: N/A;   None     RADIAL ARTERY HARVEST Left 03/06/2019   Procedure: RADIAL ARTERY HARVEST;  Surgeon: Wonda Olds, MD;  Location: [place];  Service: Open Heart Surgery;  Laterality: Left;   TEE WITHOUT CARDIOVERSION N/A 03/06/2019   Procedure: TRANSESOPHAGEAL ECHOCARDIOGRAM (TEE);  Surgeon: Wonda Olds, MD;  Location: [place];  Service: Open Heart Surgery;  Laterality: N/A;    Allergies  Allergen Reactions   Heparin     HIT antibody and SRA positive   No current facility-administered medications on file prior to encounter.   Current Outpatient Medications on File Prior to Encounter  Medication Sig Dispense Refill   acetaminophen (TYLENOL) 500 MG tablet Take 500 mg by mouth every 6 (six) hours as needed for moderate pain.     allopurinol (ZYLOPRIM) 100 MG tablet Take 1 tablet (100 mg total) by mouth daily. 30 tablet 6   ALPRAZolam (XANAX) 0.5 MG tablet TAKE 1 TABLET BY MOUTH ONCE DAILY AS NEEDED FOR ANXIETY TAKE  30  MINUTES  PRIOR  TO  PLANE  FLIGHT  OR  AS  NEEDED  FOR  ANXIETY (Patient taking differently: Take 0.5 mg by mouth daily as needed for anxiety.) 20 tablet 0   aspirin EC 81 MG tablet Take 1 tablet (81 mg total) by mouth daily.  Swallow whole. 30 tablet 11   atorvastatin (LIPITOR) 80 MG tablet Take 1 tablet (80 mg total) by mouth daily. 90 tablet 3   colchicine 0.6 MG tablet Take 2 tablets by mouth once daily 60 tablet 5   furosemide (LASIX) 20 MG tablet Take 1 tablet (20 mg total) by mouth daily. 90 tablet 3   latanoprost (XALATAN) 0.005 % ophthalmic solution Place 1 drop into both eyes every morning.      losartan (COZAAR) 25 MG tablet Take 25 mg by mouth daily.     metoprolol tartrate (LOPRESSOR) 25 MG tablet Take 1/2 (one-half) tablet by mouth twice daily (Patient taking differently: Take 25 mg  by mouth every morning.) 30 tablet 2   Misc Natural Products (OSTEO BI-FLEX/5-LOXIN ADVANCED) TABS Take 2 tablets by mouth daily.     sertraline (ZOLOFT) 50 MG tablet Take 1 tablet (50 mg total) by mouth daily. 90 tablet 3   traZODone (DESYREL) 50 MG tablet Take 0.5-1 tablets (25-50 mg total) by mouth at bedtime as needed for sleep. (Patient taking differently: Take 50 mg by mouth at bedtime.) 30 tablet 3   ketoconazole (NIZORAL) 2 % shampoo Apply 1 application topically 2 (two) times a week. (Patient not taking: Reported on 09/28/2020) 120 mL 1   traMADol (ULTRAM) 50 MG tablet TAKE 1 TABLET BY MOUTH EVERY 4 HOURS AS NEEDED FOR PAIN (Patient not taking: Reported on 09/28/2020) 30 tablet 0   Social History   Socioeconomic History   Marital status: Married    Spouse name: Cindi   Number of children: 3   Years of education: Not on file   Highest education level: Not on file  Occupational History   Occupation: Architect  Tobacco Use   Smoking status: Never   Smokeless tobacco: Never  Vaping Use   Vaping Use: Never used  Substance and Sexual Activity   Alcohol use: Yes    Alcohol/week: 12.0 standard drinks    Types: 12 Cans of beer per week   Drug use: Yes    Frequency: 2.0 times per week    Types: Marijuana    Comment: gummies   Sexual activity: Not on file  Other Topics Concern   Not on file  Social History  Narrative   Lives at home with wife.   Social Determinants of Health   Financial Resource Strain: Low Risk    Difficulty of Paying Living Expenses: Not hard at all  Food Insecurity: No Food Insecurity   Worried About Charity fundraiser in the Last Year: Never true   [place] in the Last Year: Never true  Transportation Needs: No Transportation Needs   Lack of Transportation (Medical): No   Lack of Transportation (Non-Medical): No  Physical Activity: Inactive   Days of Exercise per Week: 0 days   Minutes of Exercise per Session: 0 min  Stress: Stress Concern Present   Feeling of Stress : To some extent  Social Connections: Not on file  Intimate Partner Violence: Not At Risk   Fear of Current or Ex-Partner: No   Emotionally Abused: No   Physically Abused: No   Sexually Abused: No    Family History  Adopted: Yes  Problem Relation Age of Onset   Cancer Mother        breast cancer   Alcohol abuse Father    Breast cancer Sister      ROS:   The patient is limited by knee pain.  Otherwise as stated in the HPI and negative for all other systems.   Physical Exam: Blood pressure (!) 174/63, pulse (!) 40, temperature 98 F (36.7 C), temperature source Oral, resp. rate 16, height 5\' 6"  (1.676 m), weight 106.4 kg, SpO2 97 %.  GENERAL:  Well appearing HEENT:  Pupils equal round and reactive, fundi not visualized, oral mucosa unremarkable NECK:  No jugular venous distention, waveform within normal limits, carotid upstroke brisk and symmetric, no bruits, no thyromegaly LYMPHATICS:  No cervical, inguinal adenopathy LUNGS:  Clear to auscultation bilaterally BACK:  No CVA tenderness CHEST:  Unremarkable HEART:  PMI not displaced or sustained,S1 and S2 within normal limits, no S3, no S4, no  clicks, no rubs, 3 out of 6 apical systolic murmur radiating slightly at the aortic outflow tract, no diastolic murmurs ABD:  Flat, positive bowel sounds normal in frequency in pitch, no bruits,  no rebound, no guarding, no midline pulsatile mass, no hepatomegaly, no splenomegaly EXT:  2 plus pulses throughout, severe left greater than right lower extremity swelling up to the thighs with some mild abdominal distention as well , no cyanosis no clubbing SKIN:  No rashes no nodules NEURO:  Cranial nerves II through XII grossly intact, motor grossly intact throughout PSYCH:  Cognitively intact, oriented to person place and time   Labs: Lab Results  Component Value Date   BUN 22 09/09/2020   Lab Results  Component Value Date   CREATININE 0.76 09/09/2020   Lab Results  Component Value Date   NA 147 (H) 09/09/2020   K 4.4 09/09/2020   CL 111 09/09/2020   CO2 27 09/09/2020   No results found for: TROPONINI Lab Results  Component Value Date   WBC 5.9 09/28/2020   HGB 8.2 (L) 09/28/2020   HCT 28.7 (L) 09/28/2020   MCV 93.2 09/28/2020   PLT 223 09/28/2020   Lab Results  Component Value Date   CHOL 151 03/21/2020   HDL 67.70 03/21/2020   LDLCALC 63 03/21/2020   LDLDIRECT 151.0 01/05/2016   TRIG 98.0 03/21/2020   CHOLHDL 2 03/21/2020   Lab Results  Component Value Date   ALT 38 09/09/2020   AST 51 (H) 09/09/2020   ALKPHOS 43 09/09/2020   BILITOT 1.9 (H) 09/09/2020      Radiology:  CXR:  Pending  EKG:  Pending  ASSESSMENT AND PLAN:    BRADYCARDIA: The patient has baseline conduction disturbance.  He has symptomatic bradycardia.  He is only been on a low-dose beta-blocker and I do not think his bradycardia arrhythmia is reversible.  He needs pacing but we will determine whether he needs biventricular pacing after further information of his echocardiogram.  I have consulted EP.  HTN: His blood pressure is elevated and this will allow med titration for his slightly reduced ejection fraction.  I will switch him to [facility].  Cost is a consideration but ultimately might use an SGLT2 inhibitor.  Once he is paced we can consider beta-blocker.  ISCHEMIC CM: Awaiting  repeat echo as above.  We will pursue optimal medical therapy.  ACUTE SYSTOLIC HF: I will treat with IV diuresis and keep his feet elevated.  ANEMIA: He has an iron deficiency anemia.  I spoke with Dr. Lindi Adie tonight.  He will get IV iron while in the hospital.  He will be reviewing her labs that came back today including the elevated LDH.  At this point I do not think this is hepatic congestion.  I will check a stool guaiac.  CAD: I am not suspecting active ischemia.  He will continue with risk reduction.  AVR: We will assess this with his echocardiogram.  HIT: He is to avoid all heparin products.   SignedMinus Breeding 09/28/2020, 5:05 PM

## 2020-09-29 ENCOUNTER — Inpatient Hospital Stay (HOSPITAL_COMMUNITY): Payer: Medicare HMO

## 2020-09-29 ENCOUNTER — Ambulatory Visit: Payer: Medicare HMO

## 2020-09-29 DIAGNOSIS — R0609 Other forms of dyspnea: Secondary | ICD-10-CM | POA: Diagnosis not present

## 2020-09-29 DIAGNOSIS — I442 Atrioventricular block, complete: Secondary | ICD-10-CM | POA: Diagnosis not present

## 2020-09-29 DIAGNOSIS — I509 Heart failure, unspecified: Secondary | ICD-10-CM

## 2020-09-29 DIAGNOSIS — I5021 Acute systolic (congestive) heart failure: Secondary | ICD-10-CM | POA: Diagnosis not present

## 2020-09-29 LAB — ECHOCARDIOGRAM COMPLETE
AR max vel: 1.35 cm2
AV Area VTI: 1.7 cm2
AV Area mean vel: 1.57 cm2
AV Mean grad: 19 mmHg
AV Peak grad: 42 mmHg
Ao pk vel: 3.24 m/s
Area-P 1/2: 3.92 cm2
Calc EF: 48.1 %
Height: 66 in
P 1/2 time: 691 msec
S' Lateral: 5.3 cm
Single Plane A2C EF: 53.6 %
Single Plane A4C EF: 43.4 %
Weight: 3615.54 oz

## 2020-09-29 LAB — BASIC METABOLIC PANEL
Anion gap: 8 (ref 5–15)
BUN: 9 mg/dL (ref 8–23)
CO2: 29 mmol/L (ref 22–32)
Calcium: 8.5 mg/dL — ABNORMAL LOW (ref 8.9–10.3)
Chloride: 106 mmol/L (ref 98–111)
Creatinine, Ser: 0.89 mg/dL (ref 0.61–1.24)
GFR, Estimated: >60 mL/min (ref >60)
Glucose, Bld: 113 mg/dL — ABNORMAL HIGH (ref 70–99)
Potassium: 3.1 mmol/L — ABNORMAL LOW (ref 3.5–5.1)
Sodium: 143 mmol/L (ref 135–145)

## 2020-09-29 MED ORDER — SODIUM CHLORIDE 0.9 % IV SOLN
250.0000 mg | Freq: Every day | INTRAVENOUS | Status: AC
  Administered 2020-09-29 – 2020-10-02 (×4): 250 mg via INTRAVENOUS
  Filled 2020-09-29 (×4): qty 20

## 2020-09-29 MED ORDER — SPIRONOLACTONE 25 MG PO TABS
25.0000 mg | ORAL_TABLET | Freq: Every day | ORAL | Status: DC
  Administered 2020-09-29 – 2020-10-02 (×4): 25 mg via ORAL
  Filled 2020-09-29 (×4): qty 1

## 2020-09-29 MED ORDER — POTASSIUM CHLORIDE CRYS ER 20 MEQ PO TBCR
40.0000 meq | EXTENDED_RELEASE_TABLET | Freq: Three times a day (TID) | ORAL | Status: AC
  Administered 2020-09-29 (×3): 40 meq via ORAL
  Filled 2020-09-29 (×3): qty 2

## 2020-09-29 NOTE — Progress Notes (Signed)
Notified by CCMD that patient's rhythm was 3rd degree heart block. EKG done. Showed first degree HB. Copy of EKG in chart

## 2020-09-29 NOTE — Consult Note (Signed)
Cardiology Consultation:   Patient ID: Gregory [name] MRN: [id number]; DOB: 06-Oct-1950  Admit date: 09/28/2020 Date of Consult: 09/29/2020  PCP:  Jinny Sanders, MD   [facility] Providers Cardiologist:  [name], MD  Cardiology APP:  Lendon Colonel, NP     Patient Profile:   Gregory [name] is a 70 y.o. male with a hx of CAD/VHD w/severe AS (CABG/AVR 03/06/19), post op AFib started on amiodarone, known HIT, HTN, HLD, OSA who is being seen 09/29/2020 for the evaluation of CHB at the request of Dr. Percival Spanish.  History of Present Illness:   Mr. Wimer saw Dr. Percival Spanish 09/12/20 recently found to be anemic, holding up a planned knee surgery.  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  He was bradycardic and planned for monitoring.  Monitor noted variable degrees of AV block including some CHB, on only 12.5mg  daily of lopressor.  He was advised to come to the hospitalk for further management of acute/chronic CHF exacerbation and his heart block.  Medicine involved and addressing his anemia, planned for iron, with no reports of any signs of bleeding.  EP was asked to the case with need for pacing  LABS K+ 3.0 > 3.1 Mag 1.9BUN/Creat 9/0.89 BNP 2498 WBC 5.9 Hgb 8.2 Hct 28.7 Plts 223 TSH 1.620  TTE is pending He has output of 381ml, getting IV lasix BID Home lopressor held   Past Medical History:  Diagnosis Date   Alcohol abuse    Anxiety    Aortic valve disease    Arthritis    Coronary artery disease    Glaucoma    Hypertension    OSA (obstructive sleep apnea)    Pre-diabetes    Shingles     Past Surgical History:  Procedure Laterality Date   AORTIC VALVE REPLACEMENT N/A 03/06/2019   Procedure: AORTIC VALVE REPLACEMENT (AVR), USING INTUITY 25MM;  Surgeon: Wonda Olds, MD;  Location: [place];  Service: Open Heart Surgery;   Laterality: N/A;   CARDIAC CATHETERIZATION     CATARACT EXTRACTION     CORONARY ARTERY BYPASS GRAFT N/A 03/06/2019   Procedure: CORONARY ARTERY BYPASS GRAFTING (CABG), ON PUMP, TIMES THREE, USING BILATERAL INTERNAL MAMMARIES AND LEFT RADIAL ARTERY HARVEST;  Surgeon: Wonda Olds, MD;  Location: [place];  Service: Open Heart Surgery;  Laterality: N/A;  BILATERAL IMA   LEFT HEART CATH AND CORONARY ANGIOGRAPHY N/A 03/04/2019   Procedure: LEFT HEART CATH AND CORONARY ANGIOGRAPHY;  Surgeon: Burnell Blanks, MD;  Location: [place] [facility];  Service: Cardiovascular;  Laterality: N/A;   None     RADIAL ARTERY HARVEST Left 03/06/2019   Procedure: RADIAL ARTERY HARVEST;  Surgeon: Wonda Olds, MD;  Location: [place];  Service: Open Heart Surgery;  Laterality: Left;   TEE WITHOUT CARDIOVERSION N/A 03/06/2019   Procedure: TRANSESOPHAGEAL ECHOCARDIOGRAM (TEE);  Surgeon: Wonda Olds, MD;  Location: [place];  Service: Open Heart Surgery;  Laterality: N/A;     Home Medications:  Prior to Admission medications   Medication Sig Start Date End Date Taking? Authorizing Provider  acetaminophen (TYLENOL) 500 MG tablet Take 500 mg by mouth every 6 (six) hours as needed for moderate pain.   Yes [provider]  allopurinol (ZYLOPRIM) 100 MG tablet Take 1 tablet (100 mg total) by mouth daily. 03/18/20  Yes Bedsole, Amy E, MD  ALPRAZolam (XANAX) 0.5 MG tablet  TAKE 1 TABLET BY MOUTH ONCE DAILY AS NEEDED FOR ANXIETY TAKE  30  MINUTES  PRIOR  TO  PLANE  FLIGHT  OR  AS  NEEDED  FOR  ANXIETY Patient taking differently: Take 0.5 mg by mouth daily as needed for anxiety. 10/26/19  Yes Bedsole, Amy E, MD  aspirin EC 81 MG tablet Take 1 tablet (81 mg total) by mouth daily. Swallow whole. 08/06/19  Yes [name], MD  atorvastatin (LIPITOR) 80 MG tablet Take 1 tablet (80 mg total) by mouth daily. 12/22/19  Yes Bedsole, Amy E, MD  colchicine 0.6 MG tablet Take 2 tablets by mouth once daily 01/08/20  Yes  Bedsole, Amy E, MD  furosemide (LASIX) 20 MG tablet Take 1 tablet (20 mg total) by mouth daily. 09/12/20 12/11/20 Yes Hochrein, Jeneen Rinks, MD  latanoprost (XALATAN) 0.005 % ophthalmic solution Place 1 drop into both eyes every morning.  10/25/18  Yes [provider]  losartan (COZAAR) 25 MG tablet Take 25 mg by mouth daily. 08/18/20  Yes [provider]  metoprolol tartrate (LOPRESSOR) 25 MG tablet Take 1/2 (one-half) tablet by mouth twice daily Patient taking differently: Take 25 mg by mouth every morning. 10/12/19  Yes Bedsole, Amy E, MD  Misc Natural Products (OSTEO BI-FLEX/5-LOXIN ADVANCED) TABS Take 2 tablets by mouth daily.   Yes [provider]  sertraline (ZOLOFT) 50 MG tablet Take 1 tablet (50 mg total) by mouth daily. 12/22/19  Yes Bedsole, Amy E, MD  traZODone (DESYREL) 50 MG tablet Take 0.5-1 tablets (25-50 mg total) by mouth at bedtime as needed for sleep. Patient taking differently: Take 50 mg by mouth at bedtime. 12/22/19  Yes Bedsole, Amy E, MD  ketoconazole (NIZORAL) 2 % shampoo Apply 1 application topically 2 (two) times a week. Patient not taking: Reported on 09/28/2020 05/09/20   Jinny Sanders, MD  traMADol (ULTRAM) 50 MG tablet TAKE 1 TABLET BY MOUTH EVERY 4 HOURS AS NEEDED FOR PAIN Patient not taking: Reported on 09/28/2020 04/25/20   Jinny Sanders, MD    Inpatient Medications: Scheduled Meds:  allopurinol  100 mg Oral Daily   aspirin EC  81 mg Oral Daily   atorvastatin  80 mg Oral Daily   furosemide  40 mg Intravenous BID   sacubitril-valsartan  1 tablet Oral BID   sertraline  50 mg Oral Daily   sodium chloride flush  3 mL Intravenous Q12H   Continuous Infusions:  sodium chloride     ferric gluconate (FERRLECIT) IVPB     PRN Meds: sodium chloride, acetaminophen, ondansetron (ZOFRAN) IV, sodium chloride flush, traMADol, traZODone  Allergies:    Allergies  Allergen Reactions   Heparin     HIT antibody and SRA positive    Social History:    Social History   Socioeconomic History   Marital status: Married    Spouse name: Cindi   Number of children: 3   Years of education: Not on file   Highest education level: Not on file  Occupational History   Occupation: Architect  Tobacco Use   Smoking status: Never   Smokeless tobacco: Never  Vaping Use   Vaping Use: Never used  Substance and Sexual Activity   Alcohol use: Yes    Alcohol/week: 12.0 standard drinks    Types: 12 Cans of beer per week   Drug use: Yes    Frequency: 2.0 times per week    Types: Marijuana    Comment: gummies   Sexual activity: Not on  file  Other Topics Concern   Not on file  Social History Narrative   Lives at home with wife.   Social Determinants of Health   Financial Resource Strain: Low Risk    Difficulty of Paying Living Expenses: Not hard at all  Food Insecurity: No Food Insecurity   Worried About Charity fundraiser in the Last Year: Never true   [place] in the Last Year: Never true  Transportation Needs: No Transportation Needs   Lack of Transportation (Medical): No   Lack of Transportation (Non-Medical): No  Physical Activity: Inactive   Days of Exercise per Week: 0 days   Minutes of Exercise per Session: 0 min  Stress: Stress Concern Present   Feeling of Stress : To some extent  Social Connections: Not on file  Intimate Partner Violence: Not At Risk   Fear of Current or Ex-Partner: No   Emotionally Abused: No   Physically Abused: No   Sexually Abused: No    Family History:   Family History  Adopted: Yes  Problem Relation Age of Onset   Cancer Mother        breast cancer   Alcohol abuse Father    Breast cancer Sister      ROS:  Please see the history of present illness.  All other ROS reviewed and negative.     Physical Exam/Data:   Vitals:   09/28/20 2006 09/29/20 0026 09/29/20 0410 09/29/20 0855  BP: (!) 148/71 (!) 161/60 (!) 163/64 (!) 160/59  Pulse: (!) 48 (!) 43 (!) 46 (!) 47  Resp: 16 17 16  16   Temp: 98.5 F (36.9 C) 98.6 F (37 C) 98 F (36.7 C) 97.7 F (36.5 C)  TempSrc: Oral Oral Oral Oral  SpO2: 95% 99% 97% 97%  Weight:   102.5 kg   Height:        Intake/Output Summary (Last 24 hours) at 09/29/2020 0925 Last data filed at 09/29/2020 0856 Gross per 24 hour  Intake 0 ml  Output 3850 ml  Net -3850 ml   Last 3 Weights 09/29/2020 09/28/2020 09/22/2020  Weight (lbs) 225 lb 15.5 oz 234 lb 9.1 oz 242 lb 11.2 oz  Weight (kg) 102.5 kg 106.4 kg 110.088 kg     Body mass index is 36.47 kg/m.  General:  Well nourished, well developed, in no acute distress HEENT: normal Neck: no JVD Vascular: No carotid bruits; Distal pulses 2+ bilaterally Cardiac:  RRR; bradycardic, 3/6SM radiates to axilla, no gallops or rubs Lungs:  diminished at the bases, no wheezing, rhonchi or rales  Abd: soft, nontender  Ext: 1=-2+ edema Musculoskeletal:  No deformities Skin: warm and dry  Neuro: no focal abnormalities noted Psych:  Normal affect   EKG:  The EKG was personally reviewed and demonstrates:   CHB 41bpm, LBBB 2:1AVblock 38bpm, LBBB  OLD 09/14/20 CHB 44bpm, LBBB 03/15/20 looks like SB 45, LBBB  Telemetry:  Telemetry was personally reviewed and demonstrates:   CHB 30's-40's, infrequent PVC, one NSVT 4beat  Relevant CV Studies:   LONG TERM MONITOR (3-14 DAYS) Result Date: 09/28/2020 Predominant rhythm sinus bradycardia High degree heart block Mobitz Type I and II with probable episodes of complete heart block Junctional rhythm   12/07/19: TTE IMPRESSIONS   1. Left ventricular ejection fraction, by estimation, is 45%. The left  ventricle has mildly decreased function. The left ventricle demonstrates  regional wall motion abnormalities with septal-lateral dyssynchrony  consistent with LBBB and apical septal  and  apical severe hypokinesis. The left ventricular internal cavity size  was mildly dilated. There is mild left ventricular hypertrophy. Left  ventricular diastolic  parameters are consistent with Grade I diastolic  dysfunction (impaired relaxation).   2. Right ventricular systolic function is mildly reduced. The right  ventricular size is normal. There is normal pulmonary artery systolic  pressure. The estimated right ventricular systolic pressure is 98.1 mmHg.   3. Left atrial size was moderately dilated.   4. The mitral valve is normal in structure. No evidence of mitral valve  regurgitation. No evidence of mitral stenosis.   5. There is a bioprosthetic aortic valve. There is mild-moderate  peri-valvular leakage with 2 jets. Elevated mean gradient across the valve  at 23 mmHg, similar to previous study.   6. Aortic dilatation noted. There is moderate dilatation of the ascending  aorta, measuring 44 mm.   7. The inferior vena cava is normal in size with greater than 50%  respiratory variability, suggesting right atrial pressure of 3 mmHg.    06/12/19: TTE IMPRESSIONS   1. Left ventricular ejection fraction, by estimation, is 45 to 50%. The  left ventricle has mildly decreased function. The left ventricle  demonstrates regional wall motion abnormalities (see scoring  diagram/findings for description). There is moderate  concentric left ventricular hypertrophy. Left ventricular diastolic  parameters are consistent with Grade I diastolic dysfunction (impaired  relaxation).   2. Right ventricular systolic function is normal. The right ventricular  size is normal. There is mildly elevated pulmonary artery systolic  pressure.   3. Left atrial size was moderately dilated.   4. The mitral valve is normal in structure. Trivial mitral valve  regurgitation. No evidence of mitral stenosis.   5. The aortic valve has been repaired/replaced. Aortic valve  regurgitation is mild to moderate. There is a 25 mm [name] bovine valve  present in the aortic position. Procedure Date: 03/06/2019. Echo findings  are consistent with perivalvular leak of the  aortic  prosthesis. Aortic regurgitation PHT measures 418 msec. Aortic  valve area, by VTI measures 1.08 cm. Aortic valve mean gradient measures  21.5 mmHg. Aortic valve Vmax measures 3.24 m/s.   6. Aortic dilatation noted. There is moderate dilatation of the ascending  aorta measuring 45 mm.   Conclusion(s)/Recommendation(s): S/P CABG and AVR. EF similar, wall motion  abnormalities as noted. AVR well seated, mild (visually) to moderate (PHT)  anterior perivalvular leak.    03/04/2019: LHC Prox RCA lesion is 70% stenosed. Mid RCA lesion is 95% stenosed. RV Branch lesion is 80% stenosed. Prox Cx lesion is 40% stenosed. Prox LAD to Mid LAD lesion is 90% stenosed. Mid LAD lesion is 70% stenosed.   1. Severe calcific disease in the mid LAD 2. The Circumflex has mild to moderate proximal stenosis 3. The RCA is a large dominant vessel with severe proximal stenosis and severe mid stenosis with poor flow into the distal vessel.    Recommendations: He has complex disease in the mid LAD, diffuse complex disease in the proximal and mid RCA and moderate AI/AS. I suspect that his aortic root is enlarged.. Echo pending today. He is known to have at least moderate AS and AI by prior echo. I think he would benefit from surgical AVR and bypass of the LAD and RCA. We will arrange a cardiac CTA tomorrow to better assess his aortic root and ascending aorta. I have called CT surgery tonight and they will see him tomorrow.   Laboratory Data:  High  Sensitivity Troponin:  No results for input(s): TROPONINIHS in the last 720 hours.   Chemistry Recent Labs  Lab 09/28/20 1638 09/29/20 0141  NA 139 143  K 3.0* 3.1*  CL 104 106  CO2 28 29  GLUCOSE 102* 113*  BUN 10 9  CREATININE 0.75 0.89  CALCIUM 8.3* 8.5*  MG 1.9  --   GFRNONAA >60 >60  ANIONGAP 7 8    Recent Labs  Lab 09/28/20 1638  PROT 5.3*  ALBUMIN 3.3*  AST 43*  ALT 24  ALKPHOS 41  BILITOT 1.9*   Lipids No results for input(s): CHOL, TRIG,  HDL, LABVLDL, LDLCALC, CHOLHDL in the last 168 hours.  Hematology Recent Labs  Lab 09/28/20 1638  WBC 5.9  RBC 3.08*  HGB 8.2*  HCT 28.7*  MCV 93.2  MCH 26.6  MCHC 28.6*  RDW 17.3*  PLT 223   Thyroid  Recent Labs  Lab 09/28/20 1638  TSH 1.620    BNP Recent Labs  Lab 09/28/20 1638  BNP 2,498.6*    DDimer No results for input(s): DDIMER in the last 168 hours.   Radiology/Studies:  DG Chest 2 View Result Date: 09/29/2020 CLINICAL DATA:  Shortness of breath EXAM: CHEST - 2 VIEW COMPARISON:  Chest x-ray dated March 16, 2019 FINDINGS: Increased cardiac contours when compared with 2021 prior exams. Bibasilar opacities, likely atelectasis. No evidence of pleural effusion or pneumothorax. IMPRESSION: Increased cardiac contours when compared with 2021 prior exams. Correlate with echocardiography. Bibasilar atelectasis.  Lungs otherwise clear. Electronically Signed   By: Yetta Glassman M.D.   On: 09/29/2020 08:18     Assessment and Plan:   CHB Baseline conduction system disease Doubt low dose lopressor played a role BP stable No symptoms of near syncope or syncope.  + SOB reduced exertional capacity Also in acute/chronic CHF getting IV diuresis anemic  Dr. Quentin Ore has seen and examined the patient MR murmur on exam, very large cardiac silhouette on CXR Echo is pending  He will need pacing Will monitor as he is diuresed, wait on echo for device type Probably Monday  Continue ongoing management with attending cardiology team and IM EP will follow along and plan device timing as clinical curse progresses  Risk Assessment/Risk Scores:     For questions or updates, please contact [facility] Please consult [email or web address] for contact info under    Signed, Baldwin Jamaica, PA-C  09/29/2020 9:25 AM

## 2020-09-29 NOTE — Progress Notes (Signed)
Hematology: Patient has been admitted with symptomatic bradycardia and congestive heart failure Reviewed patient's blood work  CBC Latest Ref Rng & Units 09/28/2020 09/22/2020 09/16/2020  WBC 4.0 - 10.5 K/uL 5.9 6.1 5.7  Hemoglobin 13.0 - 17.0 g/dL 8.2(L) 8.5(L) 8.8 Repeated and verified X2.(L)  Hematocrit 39.0 - 52.0 % 28.7(L) 28.7(L) 28.6(L)  Platelets 150 - 400 K/uL 223 242 237.0   TSH: 2.56: Normal CMP: AST 43, ALT 24, bilirubin 1.9, LDH 1004 (direct Coombs test negative) SPEP: No M protein Reticulocyte count: 3.8%, absolute reticulocyte count 122, reticulocyte hemoglobin: 23.9 (suggestive of iron deficiency or ineffective erythropoiesis) T51: 761, folic acid 60.7 Erythropoietin: 235.3 09/16/2020: Iron studies revealed ferritin of 14 and iron saturation of 5.3%  Plan: Plan to administer Feraheme in the hospital Elevated LDH with normal direct Coombs test: Raising the concern that the patient may have nonautoimmune hemolysis, the differential includes conditions like G6PD deficiency, pyruvate kinase deficiency etc.  There is no role of steroids because of the negative Coombs test.  Therefore the labs will be monitored Elevated erythropoietin and elevated reticulocyte count: Suggest bone marrow response to the anemia. Although the hemoglobin is low, it has not dropped in the last 2 weeks.  It has remained stable around 8.5 g.  Will watch and monitor.

## 2020-09-29 NOTE — Progress Notes (Addendum)
Progress Note  Patient Name: Gregory [name] Date of Encounter: 09/29/2020  Primary Cardiologist:   [name], MD   Subjective   Ambulated in the hallway.  HR in the forties for the most part.  Sats down to 88 and mildly SOB.  Otherwise did well overnight.   Inpatient Medications    Scheduled Meds:  allopurinol  100 mg Oral Daily   aspirin EC  81 mg Oral Daily   atorvastatin  80 mg Oral Daily   furosemide  40 mg Intravenous BID   sacubitril-valsartan  1 tablet Oral BID   sertraline  50 mg Oral Daily   sodium chloride flush  3 mL Intravenous Q12H   Continuous Infusions:  sodium chloride     ferric gluconate (FERRLECIT) IVPB     PRN Meds: sodium chloride, acetaminophen, ondansetron (ZOFRAN) IV, sodium chloride flush, traMADol, traZODone   Vital Signs    Vitals:   09/28/20 1515 09/28/20 2006 09/29/20 0026 09/29/20 0410  BP: (!) 174/63 (!) 148/71 (!) 161/60 (!) 163/64  Pulse: (!) 40 (!) 48 (!) 43 (!) 46  Resp: 16 16 17 16   Temp: 98 F (36.7 C) 98.5 F (36.9 C) 98.6 F (37 C) 98 F (36.7 C)  TempSrc: Oral Oral Oral Oral  SpO2: 97% 95% 99% 97%  Weight: 106.4 kg   102.5 kg  Height: 5\' 6"  (1.676 m)       Intake/Output Summary (Last 24 hours) at 09/29/2020 0834 Last data filed at 09/29/2020 0428 Gross per 24 hour  Intake 0 ml  Output 3350 ml  Net -3350 ml   Filed Weights   09/28/20 1515 09/29/20 0410  Weight: 106.4 kg 102.5 kg    Telemetry    SB - Personally Reviewed  ECG    NA - Personally Reviewed  Physical Exam   GEN: No acute distress.   Neck: No  JVD Cardiac: RRR, 2/6 apical systolic murmur, mild diastolic murmur lasting long into diastole.   Respiratory: Clear  to auscultation bilaterally. GI: Soft, nontender, non-distended  MS:    Moderate leg edema; No deformity. Neuro:  Nonfocal  Psych: Normal affect   Labs    Chemistry Recent Labs  Lab 09/28/20 1638 09/29/20 0141  NA 139 143  K 3.0* 3.1*  CL 104 106  CO2 28 29  GLUCOSE  102* 113*  BUN 10 9  CREATININE 0.75 0.89  CALCIUM 8.3* 8.5*  PROT 5.3*  --   ALBUMIN 3.3*  --   AST 43*  --   ALT 24  --   ALKPHOS 41  --   BILITOT 1.9*  --   GFRNONAA >60 >60  ANIONGAP 7 8     Hematology Recent Labs  Lab 09/22/20 0914 09/28/20 1638  WBC 6.1 5.9  RBC 3.16*  3.21* 3.08*  HGB 8.5* 8.2*  HCT 28.7* 28.7*  MCV 90.8 93.2  MCH 26.9 26.6  MCHC 29.6* 28.6*  RDW 15.5 17.3*  PLT 242 223    Cardiac EnzymesNo results for input(s): TROPONINI in the last 168 hours. No results for input(s): TROPIPOC in the last 168 hours.   BNP Recent Labs  Lab 09/28/20 1638  BNP 2,498.6*     DDimer No results for input(s): DDIMER in the last 168 hours.   Radiology    DG Chest 2 View  Result Date: 09/29/2020 CLINICAL DATA:  Shortness of breath EXAM: CHEST - 2 VIEW COMPARISON:  Chest x-ray dated March 16, 2019 FINDINGS: Increased cardiac contours when  compared with 2021 prior exams. Bibasilar opacities, likely atelectasis. No evidence of pleural effusion or pneumothorax. IMPRESSION: Increased cardiac contours when compared with 2021 prior exams. Correlate with echocardiography. Bibasilar atelectasis.  Lungs otherwise clear. Electronically Signed   By: Yetta Glassman M.D.   On: 09/29/2020 08:18   LONG TERM MONITOR (3-14 DAYS)  Result Date: 09/28/2020 Predominant rhythm sinus bradycardia High degree heart block Mobitz Type I and II with probable episodes of complete heart block Junctional rhythm   Cardiac Studies   ECHO:  Pending  Patient Profile     70 y.o. male with CAD/CABG, AVR, mildly reduced EF who presented with symptomatic bradycardia and acute on chronic systolic and diastolic HF.  Also being treated for anemia.    Assessment & Plan    BRADYCARDIA:    EP consult and plan for device on Monday.     Not chronotropically competent.    HTN: His blood pressure is elevated.  Started Entresto.   I will start spironolactone as well.     ISCHEMIC CM: Awaiting repeat  echo.  Changed to [facility] yesterday.     ACUTE SYSTOLIC HF:    Net negative 3350 since admission although intake likely not counted. Continue diuresis.  On Entresto.     Start spiro. Continue current dose Lasix.     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  Being given Feraheme in the hospital.  No suggestion of bleeding but guaiac is pending.    HYPOKALEMIA:    Supplemented and he will need more today.     CAD: I am not suspecting active ischemia.     AVR: Echo pending.     HIT: He is to avoid all heparin products.  For questions or updates, please contact [place] Please consult [email or web address] for contact info under Cardiology/STEMI.   Signed, [name], MD  09/29/2020, 8:34 AM

## 2020-09-29 NOTE — Plan of Care (Signed)
  Problem: Activity: Goal: Risk for activity intolerance will decrease Outcome: Progressing   Problem: Health Behavior/Discharge Planning: Goal: Ability to manage health-related needs will improve Outcome: Not Progressing

## 2020-09-29 NOTE — Progress Notes (Signed)
Heart Failure Nurse Navigator Progress Note  Following this admission to assess for HV TOC readiness. Pt currently awaiting EP eval for potential device implantation for bradycardia/HB. Pt is s/p CABG and AVRx 03/2019.   ECHO pending.  Pricilla Holm, MSN, RN Heart Failure Nurse Navigator [phone]

## 2020-09-30 ENCOUNTER — Inpatient Hospital Stay (HOSPITAL_COMMUNITY): Payer: Medicare HMO

## 2020-09-30 DIAGNOSIS — R001 Bradycardia, unspecified: Secondary | ICD-10-CM | POA: Diagnosis not present

## 2020-09-30 DIAGNOSIS — T8203XS Leakage of heart valve prosthesis, sequela: Secondary | ICD-10-CM | POA: Diagnosis not present

## 2020-09-30 DIAGNOSIS — I5043 Acute on chronic combined systolic (congestive) and diastolic (congestive) heart failure: Secondary | ICD-10-CM

## 2020-09-30 DIAGNOSIS — I442 Atrioventricular block, complete: Secondary | ICD-10-CM

## 2020-09-30 DIAGNOSIS — T8209XA Other mechanical complication of heart valve prosthesis, initial encounter: Secondary | ICD-10-CM | POA: Diagnosis not present

## 2020-09-30 DIAGNOSIS — I5031 Acute diastolic (congestive) heart failure: Secondary | ICD-10-CM | POA: Diagnosis not present

## 2020-09-30 LAB — CBC WITH DIFFERENTIAL/PLATELET
Abs Immature Granulocytes: 0.01 10*3/uL (ref 0.00–0.07)
Basophils Absolute: 0 10*3/uL (ref 0.0–0.1)
Basophils Relative: 0 %
Eosinophils Absolute: 0.1 10*3/uL (ref 0.0–0.5)
Eosinophils Relative: 2 %
HCT: 35.5 % — ABNORMAL LOW (ref 39.0–52.0)
Hemoglobin: 10.2 g/dL — ABNORMAL LOW (ref 13.0–17.0)
Immature Granulocytes: 0 %
Lymphocytes Relative: 25 %
Lymphs Abs: 1.4 10*3/uL (ref 0.7–4.0)
MCH: 26.6 pg (ref 26.0–34.0)
MCHC: 28.7 g/dL — ABNORMAL LOW (ref 30.0–36.0)
MCV: 92.4 fL (ref 80.0–100.0)
Monocytes Absolute: 0.5 10*3/uL (ref 0.1–1.0)
Monocytes Relative: 10 %
Neutro Abs: 3.4 10*3/uL (ref 1.7–7.7)
Neutrophils Relative %: 63 %
Platelets: 216 10*3/uL (ref 150–400)
RBC: 3.84 MIL/uL — ABNORMAL LOW (ref 4.22–5.81)
RDW: 17.3 % — ABNORMAL HIGH (ref 11.5–15.5)
WBC: 5.4 10*3/uL (ref 4.0–10.5)
nRBC: 0 % (ref 0.0–0.2)

## 2020-09-30 LAB — BASIC METABOLIC PANEL
Anion gap: 10 (ref 5–15)
BUN: 9 mg/dL (ref 8–23)
CO2: 29 mmol/L (ref 22–32)
Calcium: 8.6 mg/dL — ABNORMAL LOW (ref 8.9–10.3)
Chloride: 104 mmol/L (ref 98–111)
Creatinine, Ser: 0.77 mg/dL (ref 0.61–1.24)
GFR, Estimated: >60 mL/min (ref >60)
Glucose, Bld: 110 mg/dL — ABNORMAL HIGH (ref 70–99)
Potassium: 3.5 mmol/L (ref 3.5–5.1)
Sodium: 143 mmol/L (ref 135–145)

## 2020-09-30 LAB — LACTATE DEHYDROGENASE: LDH: 939 U/L — ABNORMAL HIGH (ref 98–192)

## 2020-09-30 MED ORDER — ALPRAZOLAM 0.5 MG PO TABS
0.5000 mg | ORAL_TABLET | Freq: Two times a day (BID) | ORAL | Status: DC | PRN
  Administered 2020-09-30: 0.5 mg via ORAL
  Filled 2020-09-30: qty 1

## 2020-09-30 MED ORDER — IOHEXOL 350 MG/ML SOLN
100.0000 mL | Freq: Once | INTRAVENOUS | Status: AC | PRN
  Administered 2020-09-30: 100 mL via INTRAVENOUS

## 2020-09-30 NOTE — Progress Notes (Signed)
Progress Note  Patient Name: Gregory [name] Date of Encounter: 09/30/2020  Primary Cardiologist:   [name], MD   Subjective   He feels 100% better.  No pain. Breathing is better   Inpatient Medications    Scheduled Meds:  allopurinol  100 mg Oral Daily   aspirin EC  81 mg Oral Daily   atorvastatin  80 mg Oral Daily   furosemide  40 mg Intravenous BID   sacubitril-valsartan  1 tablet Oral BID   sertraline  50 mg Oral Daily   sodium chloride flush  3 mL Intravenous Q12H   spironolactone  25 mg Oral Daily   Continuous Infusions:  sodium chloride     ferric gluconate (FERRLECIT) IVPB 250 mg (09/30/20 0948)   PRN Meds: sodium chloride, acetaminophen, ondansetron (ZOFRAN) IV, sodium chloride flush, traMADol, traZODone   Vital Signs    Vitals:   09/29/20 2025 09/29/20 2347 09/30/20 0326 09/30/20 0802  BP: 140/69 (!) 158/53 (!) 152/60 (!) 151/63  Pulse: 72 (!) 41 (!) 41 (!) 42  Resp: 18 16 15 16   Temp: 98.1 F (36.7 C) (!) 97.3 F (36.3 C) 97.8 F (36.6 C) 98.4 F (36.9 C)  TempSrc: Oral Oral Oral Oral  SpO2: 92% 90% 93% 95%  Weight:      Height:        Intake/Output Summary (Last 24 hours) at 09/30/2020 1021 Last data filed at 09/30/2020 0805 Gross per 24 hour  Intake 270 ml  Output 2530 ml  Net -2260 ml   Filed Weights   09/28/20 1515 09/29/20 0410  Weight: 106.4 kg 102.5 kg    Telemetry    SB,  - Personally Reviewed  ECG    NA - Personally Reviewed  Physical Exam   GEN: No  acute distress.   Neck: No  JVD Cardiac: RRR, 3/6 apical systolic murmur, no diastolic murmurs, rubs, or gallops.  Respiratory: Clear   to auscultation bilaterally. GI: Soft, nontender, non-distended, normal bowel sounds  MS:  Moderate edema; No deformity. Neuro:   Nonfocal  Psych: Oriented and appropriate     Labs    Chemistry Recent Labs  Lab 09/28/20 1638 09/29/20 0141 09/30/20 0142  NA 139 143 143  K 3.0* 3.1* 3.5  CL 104 106 104  CO2 28 29 29    GLUCOSE 102* 113* 110*  BUN 10 9 9   CREATININE 0.75 0.89 0.77  CALCIUM 8.3* 8.5* 8.6*  PROT 5.3*  --   --   ALBUMIN 3.3*  --   --   AST 43*  --   --   ALT 24  --   --   ALKPHOS 41  --   --   BILITOT 1.9*  --   --   GFRNONAA >60 >60 >60  ANIONGAP 7 8 10      Hematology Recent Labs  Lab 09/28/20 1638 09/30/20 0819  WBC 5.9 5.4  RBC 3.08* 3.84*  HGB 8.2* 10.2*  HCT 28.7* 35.5*  MCV 93.2 92.4  MCH 26.6 26.6  MCHC 28.6* 28.7*  RDW 17.3* 17.3*  PLT 223 216    Cardiac EnzymesNo results for input(s): TROPONINI in the last 168 hours. No results for input(s): TROPIPOC in the last 168 hours.   BNP Recent Labs  Lab 09/28/20 1638  BNP 2,498.6*     DDimer No results for input(s): DDIMER in the last 168 hours.   Radiology    DG Chest 2 View  Result Date: 09/29/2020 CLINICAL DATA:  Shortness of breath EXAM: CHEST - 2 VIEW COMPARISON:  Chest x-ray dated March 16, 2019 FINDINGS: Increased cardiac contours when compared with 2021 prior exams. Bibasilar opacities, likely atelectasis. No evidence of pleural effusion or pneumothorax. IMPRESSION: Increased cardiac contours when compared with 2021 prior exams. Correlate with echocardiography. Bibasilar atelectasis.  Lungs otherwise clear. Electronically Signed   By: Yetta Glassman M.D.   On: 09/29/2020 08:18   ECHOCARDIOGRAM COMPLETE  Result Date: 09/29/2020    ECHOCARDIOGRAM REPORT   Patient Name:   Gregory [name] Date of Exam: 09/29/2020 Medical Rec #:  [id number]         Height:       66.0 in Accession #:    [id number]        Weight:       226.0 lb Date of Birth:  15-May-1950         BSA:          2.106 m Patient Age:    70 years          BP:           160/59 mmHg Patient Gender: M                 HR:           41 bpm. Exam Location:  Inpatient Procedure: 2D Echo, Cardiac Doppler and Color Doppler Indications:    Dyspnea R06.00  History:        Patient has prior history of Echocardiogram examinations, most                 recent  12/07/2019. CAD, Aortic Valve Disease, Arrythmias:LBBB and                 Complete Heart Block, Signs/Symptoms:Shortness of Breath; Risk                 Factors:Hypertension, Dyslipidemia and Sleep Apnea.                 Aortic Valve: 25 mm INTUITY valve is present in the aortic                 position. Procedure Date: 03/06/2019.  Sonographer:    Darlina Sicilian RDCS Referring Phys: [id number] [place]  1. The aortic valve has been Intuity 25 mm valve in the aortic position. There are two peri-valvular jets that are moderate in severity; in summation suggestive of severe regurgitation. There is a 25 mm INTUITY valve present in the aortic position. Procedure Date: 03/06/2019.  2. Left ventricular ejection fraction, by estimation, is 40 to 45%. The left ventricle has mildly decreased function. The left ventricle demonstrates global hypokinesis. The left ventricular internal cavity size was moderately dilated. There is mild concentric left ventricular hypertrophy. Left ventricular diastolic parameters are indeterminate.  3. Right ventricular systolic function is normal. The right ventricular size is normal. There is normal pulmonary artery systolic pressure.  4. Left atrial size was severely dilated.  5. The mitral valve is normal in structure. No evidence of mitral valve regurgitation.  6. Aortic dilatation noted. There is mild dilatation of the aortic root, measuring 44 mm. There is moderate dilatation of the ascending aorta, measuring 45 mm. Comparison(s): A prior study was performed on 12/07/2019. LV function is slightly worse, LV dilation has dilated. Comparison of the apical 5 chamber view and PSAX view (which does not show both jets) shows worse regurgitation despite being done at a lower  Nyquist  limit. In comparison to post operative TEE, perivalvular leak which was present psot op has worsened. Conclusion(s)/Recommendation(s): TEE or cardiac MRI could be considered for further  quantification of valve dysfunction. FINDINGS  Left Ventricle: Left ventricular ejection fraction, by estimation, is 40 to 45%. The left ventricle has mildly decreased function. The left ventricle demonstrates global hypokinesis. The left ventricular internal cavity size was moderately dilated. There is mild concentric left ventricular hypertrophy. Abnormal (paradoxical) septal motion, consistent with left bundle branch block. Left ventricular diastolic parameters are indeterminate. Right Ventricle: The right ventricular size is normal. Right vetricular wall thickness was not well visualized. Right ventricular systolic function is normal. There is normal pulmonary artery systolic pressure. The tricuspid regurgitant velocity is 2.62 m/s, and with an assumed right atrial pressure of 3 mmHg, the estimated right ventricular systolic pressure is 12.4 mmHg. Left Atrium: Left atrial size was severely dilated. Right Atrium: Right atrial size was normal in size. Pericardium: There is no evidence of pericardial effusion. Mitral Valve: The mitral valve is normal in structure. No evidence of mitral valve regurgitation. Tricuspid Valve: The tricuspid valve is normal in structure. Tricuspid valve regurgitation is trivial. Aortic Valve: The aortic valve has been repaired/replaced. Aortic valve regurgitation is severe. Aortic regurgitation PHT measures 691 msec. Aortic valve mean gradient measures 19.0 mmHg. Aortic valve peak gradient measures 42.0 mmHg. Aortic valve area, by VTI measures 1.70 cm. There is a 25 mm INTUITY valve present in the aortic position. Procedure Date: 03/06/2019. Pulmonic Valve: The pulmonic valve was not well visualized. Pulmonic valve regurgitation is trivial. No evidence of pulmonic stenosis. Aorta: Aortic dilatation noted. There is mild dilatation of the aortic root, measuring 44 mm. There is moderate dilatation of the ascending aorta, measuring 45 mm. IAS/Shunts: The atrial septum is grossly normal.   LEFT VENTRICLE PLAX 2D LVIDd:         6.70 cm      Diastology LVIDs:         5.30 cm      LV e' medial:    6.57 cm/s LV PW:         1.30 cm      LV E/e' medial:  17.5 LV IVS:        1.50 cm      LV e' lateral:   9.02 cm/s LVOT diam:     2.60 cm      LV E/e' lateral: 12.8 LV SV:         98 LV SV Index:   47 LVOT Area:     5.31 cm  LV Volumes (MOD) LV vol d, MOD A2C: 390.0 ml LV vol d, MOD A4C: 355.0 ml LV vol s, MOD A2C: 181.0 ml LV vol s, MOD A4C: 201.0 ml LV SV MOD A2C:     209.0 ml LV SV MOD A4C:     355.0 ml LV SV MOD BP:      180.5 ml RIGHT VENTRICLE RV S prime:     8.66 cm/s TAPSE (M-mode): 1.5 cm LEFT ATRIUM              Index       RIGHT ATRIUM           Index LA diam:        5.40 cm  2.56 cm/m  RA Area:     15.10 cm LA Vol (A2C):   128.0 ml 60.77 ml/m RA Volume:   32.90 ml  15.62 ml/m LA Vol (A4C):  139.0 ml 65.99 ml/m LA Biplane Vol: 136.0 ml 64.57 ml/m  AORTIC VALVE AV Area (Vmax):    1.35 cm AV Area (Vmean):   1.57 cm AV Area (VTI):     1.70 cm AV Vmax:           324.00 cm/s AV Vmean:          183.200 cm/s AV VTI:            0.580 m AV Peak Grad:      42.0 mmHg AV Mean Grad:      19.0 mmHg LVOT Vmax:         82.50 cm/s LVOT Vmean:        54.250 cm/s LVOT VTI:          0.186 m LVOT/AV VTI ratio: 0.32 AI PHT:            691 msec  AORTA Ao Root diam: 4.40 cm Ao Asc diam:  4.55 cm MITRAL VALVE                TRICUSPID VALVE MV Area (PHT): 3.92 cm     TR Peak grad:   27.5 mmHg MV Decel Time: 194 msec     TR Vmax:        262.00 cm/s MV E velocity: 115.08 cm/s                             SHUNTS                             Systemic VTI:  0.19 m                             Systemic Diam: 2.60 cm Rudean Haskell MD Electronically signed by Rudean Haskell MD Signature Date/Time: 09/29/2020/2:43:41 PM    Final     Cardiac Studies   ECHO:      Patient Profile     70 y.o. male with CAD/CABG, AVR, mildly reduced EF who presented with symptomatic bradycardia and acute on chronic systolic  and diastolic HF.  Also being treated for anemia.    Assessment & Plan    BRADYCARDIA:    I have had a long conversation about his need for pacing.  He will also need repair of his perivalvular leak.  The question will be if this is an open repair or percutaneous.  This will dictate how we address his device.  (Open LV lead placement vs percutaneous if we can fix his valve without open chest.)  Therfor, we are going to hold off on pacing currently as he has no unstable symptoms such as syncope.  Should those occur we would present to the ED.    HTN:   Started Entresto.   I started spironolactone as well.   I will increase Entresto as an out patient.     ISCHEMIC CM:   EF listed at 45% but I think it appears lower than than and lower than previous.  He had not tolerated med titration previous with post operative hypotension but is tolerating med changes as above.   I plan to up titrate the meds further by going up on the [place] today.      ACUTE SYSTOLIC HF:    Net negative 6690 since admission.     ANEMIA:   Work up  is ongoing per Dr. 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  Further treatment and work up per Dr. Lindi Adie.    Hgb is improved.   HYPOKALEMIA:    Supplemented and improved.    CAD:  He has had I am not suspecting active ischemia.     AVR:   He has a 25 mm [name] Untuity bovine valve. He has had perivalvular lead noted.  Visually looks more severe.  As mentioned I think the EF is slightly less.  LV is moderately dilated.  Summed vena contracta was assessed for the two jets.   Severe and needs to be repaired.  I have discussed with Dr. Burt Knack and had a long conversation with the patient.  We are ordering a TEE and CT and these will be arranged with the structural team with an eye toward possibly fixing these leaks electively.     HIT: He is to avoid all heparin products.  For questions or updates, please contact  [place] Please consult [email or web address] for contact info under Cardiology/STEMI.   Signed, [name], MD  09/30/2020, 10:21 AM

## 2020-09-30 NOTE — Progress Notes (Signed)
Progress Note  Patient Name: Gregory [name] Date of Encounter: 09/30/2020  [facility] Cardiologist: [name], MD   Subjective   Feeling much better though still a little bloated, no dizzy spells, near syncope or syncope, no rest SOB  Inpatient Medications    Scheduled Meds:  allopurinol  100 mg Oral Daily   aspirin EC  81 mg Oral Daily   atorvastatin  80 mg Oral Daily   furosemide  40 mg Intravenous BID   sacubitril-valsartan  1 tablet Oral BID   sertraline  50 mg Oral Daily   sodium chloride flush  3 mL Intravenous Q12H   spironolactone  25 mg Oral Daily   Continuous Infusions:  sodium chloride     ferric gluconate (FERRLECIT) IVPB 250 mg (09/29/20 1048)   PRN Meds: sodium chloride, acetaminophen, ondansetron (ZOFRAN) IV, sodium chloride flush, traMADol, traZODone   Vital Signs    Vitals:   09/29/20 1723 09/29/20 2025 09/29/20 2347 09/30/20 0326  BP: (!) 160/73 140/69 (!) 158/53 (!) 152/60  Pulse: (!) 45 72 (!) 41 (!) 41  Resp: 18 18 16 15   Temp: 98.5 F (36.9 C) 98.1 F (36.7 C) (!) 97.3 F (36.3 C) 97.8 F (36.6 C)  TempSrc: Oral Oral Oral Oral  SpO2: 96% 92% 90% 93%  Weight:      Height:        Intake/Output Summary (Last 24 hours) at 09/30/2020 0804 Last data filed at 09/30/2020 0329 Gross per 24 hour  Intake 390 ml  Output 3730 ml  Net -3340 ml   Last 3 Weights 09/29/2020 09/28/2020 09/22/2020  Weight (lbs) 225 lb 15.5 oz 234 lb 9.1 oz 242 lb 11.2 oz  Weight (kg) 102.5 kg 106.4 kg 110.088 kg      Telemetry    CHB 40's - Personally Reviewed  ECG    No new EKGs - Personally Reviewed  Physical Exam   GEN: No acute distress.   Neck: No JVD Cardiac: RRR, bradycardic, 4/6 SM/DM, no rubs, or gallops.  Respiratory: sligtly diminished at the bases. GI: Soft, nontender, non-distended  MS: edema is improving; No deformity. Neuro:  Nonfocal  Psych: Normal affect   Labs    High Sensitivity Troponin:  No results for input(s):  TROPONINIHS in the last 720 hours.   Chemistry Recent Labs  Lab 09/28/20 1638 09/29/20 0141 09/30/20 0142  NA 139 143 143  K 3.0* 3.1* 3.5  CL 104 106 104  CO2 28 29 29   GLUCOSE 102* 113* 110*  BUN 10 9 9   CREATININE 0.75 0.89 0.77  CALCIUM 8.3* 8.5* 8.6*  MG 1.9  --   --   PROT 5.3*  --   --   ALBUMIN 3.3*  --   --   AST 43*  --   --   ALT 24  --   --   ALKPHOS 41  --   --   BILITOT 1.9*  --   --   GFRNONAA >60 >60 >60  ANIONGAP 7 8 10     Lipids No results for input(s): CHOL, TRIG, HDL, LABVLDL, LDLCALC, CHOLHDL in the last 168 hours.  Hematology Recent Labs  Lab 09/28/20 1638  WBC 5.9  RBC 3.08*  HGB 8.2*  HCT 28.7*  MCV 93.2  MCH 26.6  MCHC 28.6*  RDW 17.3*  PLT 223   Thyroid  Recent Labs  Lab 09/28/20 1638  TSH 1.620    BNP Recent Labs  Lab 09/28/20 1638  BNP  2,498.6*    DDimer No results for input(s): DDIMER in the last 168 hours.   Radiology      Cardiac Studies   09/29/20: TTE IMPRESSIONS   1. The aortic valve has been Intuity 25 mm valve in the aortic position.  There are two peri-valvular jets that are moderate in severity; in  summation suggestive of severe regurgitation. There is a 25 mm INTUITY  valve present in the aortic position.  Procedure Date: 03/06/2019.   2. Left ventricular ejection fraction, by estimation, is 40 to 45%. The  left ventricle has mildly decreased function. The left ventricle  demonstrates global hypokinesis. The left ventricular internal cavity size  was moderately dilated. There is mild  concentric left ventricular hypertrophy. Left ventricular diastolic  parameters are indeterminate.   3. Right ventricular systolic function is normal. The right ventricular  size is normal. There is normal pulmonary artery systolic pressure.   4. Left atrial size was severely dilated.   5. The mitral valve is normal in structure. No evidence of mitral valve  regurgitation.   6. Aortic dilatation noted. There is mild  dilatation of the aortic root,  measuring 44 mm. There is moderate dilatation of the ascending aorta,  measuring 45 mm.   Comparison(s): A prior study was performed on 12/07/2019. LV function is  slightly worse, LV dilation has dilated. Comparison of the apical 5  chamber view and PSAX view (which does not show both jets) shows worse  regurgitation despite being done at a lower   Nyquist limit. In comparison to post operative TEE, perivalvular leak  which was present psot op has worsened.   Conclusion(s)/Recommendation(s): TEE or cardiac MRI could be considered  for further quantification of valve dysfunction.    LONG TERM MONITOR (3-14 DAYS) Result Date: 09/28/2020 Predominant rhythm sinus bradycardia High degree heart block Mobitz Type I and II with probable episodes of complete heart block Junctional rhythm    12/07/19: TTE IMPRESSIONS   1. Left ventricular ejection fraction, by estimation, is 45%. The left  ventricle has mildly decreased function. The left ventricle demonstrates  regional wall motion abnormalities with septal-lateral dyssynchrony  consistent with LBBB and apical septal  and apical severe hypokinesis. The left ventricular internal cavity size  was mildly dilated. There is mild left ventricular hypertrophy. Left  ventricular diastolic parameters are consistent with Grade I diastolic  dysfunction (impaired relaxation).   2. Right ventricular systolic function is mildly reduced. The right  ventricular size is normal. There is normal pulmonary artery systolic  pressure. The estimated right ventricular systolic pressure is 40.9 mmHg.   3. Left atrial size was moderately dilated.   4. The mitral valve is normal in structure. No evidence of mitral valve  regurgitation. No evidence of mitral stenosis.   5. There is a bioprosthetic aortic valve. There is mild-moderate  peri-valvular leakage with 2 jets. Elevated mean gradient across the valve  at 23 mmHg, similar to  previous study.   6. Aortic dilatation noted. There is moderate dilatation of the ascending  aorta, measuring 44 mm.   7. The inferior vena cava is normal in size with greater than 50%  respiratory variability, suggesting right atrial pressure of 3 mmHg.      06/12/19: TTE IMPRESSIONS   1. Left ventricular ejection fraction, by estimation, is 45 to 50%. The  left ventricle has mildly decreased function. The left ventricle  demonstrates regional wall motion abnormalities (see scoring  diagram/findings for description). There is moderate  concentric left ventricular hypertrophy. Left ventricular diastolic  parameters are consistent with Grade I diastolic dysfunction (impaired  relaxation).   2. Right ventricular systolic function is normal. The right ventricular  size is normal. There is mildly elevated pulmonary artery systolic  pressure.   3. Left atrial size was moderately dilated.   4. The mitral valve is normal in structure. Trivial mitral valve  regurgitation. No evidence of mitral stenosis.   5. The aortic valve has been repaired/replaced. Aortic valve  regurgitation is mild to moderate. There is a 25 mm [name] bovine valve  present in the aortic position. Procedure Date: 03/06/2019. Echo findings  are consistent with perivalvular leak of the  aortic prosthesis. Aortic regurgitation PHT measures 418 msec. Aortic  valve area, by VTI measures 1.08 cm. Aortic valve mean gradient measures  21.5 mmHg. Aortic valve Vmax measures 3.24 m/s.   6. Aortic dilatation noted. There is moderate dilatation of the ascending  aorta measuring 45 mm.   Conclusion(s)/Recommendation(s): S/P CABG and AVR. EF similar, wall motion  abnormalities as noted. AVR well seated, mild (visually) to moderate (PHT)  anterior perivalvular leak.      03/04/2019: LHC Prox RCA lesion is 70% stenosed. Mid RCA lesion is 95% stenosed. RV Branch lesion is 80% stenosed. Prox Cx lesion is 40% stenosed. Prox LAD to  Mid LAD lesion is 90% stenosed. Mid LAD lesion is 70% stenosed.   1. Severe calcific disease in the mid LAD 2. The Circumflex has mild to moderate proximal stenosis 3. The RCA is a large dominant vessel with severe proximal stenosis and severe mid stenosis with poor flow into the distal vessel.    Recommendations: He has complex disease in the mid LAD, diffuse complex disease in the proximal and mid RCA and moderate AI/AS. I suspect that his aortic root is enlarged.. Echo pending today. He is known to have at least moderate AS and AI by prior echo. I think he would benefit from surgical AVR and bypass of the LAD and RCA. We will arrange a cardiac CTA tomorrow to better assess his aortic root and ascending aorta. I have called CT surgery tonight and they will see him tomorrow.   Patient Profile     70 y.o. male with a hx of CAD/VHD w/severe AS (CABG/AVR 03/06/19), post op AFib started on amiodarone, known HIT, HTN, HLD, OSA admitted with worsening exertional capacity, weakness, DOE, and CHB and acute/chronic CHF  Assessment & Plan      CHB, rates remain 40's Baseline conduction system disease Doubt low dose lopressor played a role BP stable No symptoms of near syncope or syncope.  He will need pacing, timing and device choices pending BP remains very stable and without overt symptoms of brady currently Feeling better actually as he is being diuresed  Dr. Quentin Ore has seen and examined the patient this AM Plans to discuss echo with Dr. Percival Spanish and go from there   [place] care otherwise with attending cardiology team, hematology as well.   For questions or updates, please contact [place] Please consult [email or web address] for contact info under        Signed, Baldwin [place], PA-C  09/30/2020, 8:04 AM

## 2020-09-30 NOTE — Progress Notes (Signed)
Patient requesting an order for xanax for anxiety. MD paged.   Daymon Larsen, RN

## 2020-09-30 NOTE — Progress Notes (Signed)
Hematology: Discussion with cardiology whether the patient might have valvular heart disease related acute hemolysis given the fact that he has elevated LDH, reticulocyte count and elevated bilirubin with a negative direct Coombs test. It is rare but possible to have shearing forces from a very severe valvular heart disease which could cause hemolysis.  Plan: Recheck labs with CBC and LDH No indication for blood transfusion at this time.

## 2020-10-01 LAB — BASIC METABOLIC PANEL
Anion gap: 9 (ref 5–15)
BUN: 8 mg/dL (ref 8–23)
CO2: 30 mmol/L (ref 22–32)
Calcium: 8.3 mg/dL — ABNORMAL LOW (ref 8.9–10.3)
Chloride: 102 mmol/L (ref 98–111)
Creatinine, Ser: 0.8 mg/dL (ref 0.61–1.24)
GFR, Estimated: >60 mL/min (ref >60)
Glucose, Bld: 90 mg/dL (ref 70–99)
Potassium: 2.9 mmol/L — ABNORMAL LOW (ref 3.5–5.1)
Sodium: 141 mmol/L (ref 135–145)

## 2020-10-01 MED ORDER — POTASSIUM CHLORIDE CRYS ER 20 MEQ PO TBCR
40.0000 meq | EXTENDED_RELEASE_TABLET | Freq: Three times a day (TID) | ORAL | Status: AC
  Administered 2020-10-01 (×3): 40 meq via ORAL
  Filled 2020-10-01 (×3): qty 2

## 2020-10-01 MED ORDER — POTASSIUM CHLORIDE 10 MEQ/100ML IV SOLN
10.0000 meq | INTRAVENOUS | Status: AC
Start: 2020-10-01 — End: 2020-10-01
  Administered 2020-10-01 (×2): 10 meq via INTRAVENOUS
  Filled 2020-10-01 (×2): qty 100

## 2020-10-01 MED ORDER — SACUBITRIL-VALSARTAN 24-26 MG PO TABS
1.0000 | ORAL_TABLET | Freq: Once | ORAL | Status: AC
  Administered 2020-10-01: 1 via ORAL
  Filled 2020-10-01: qty 1

## 2020-10-01 MED ORDER — SACUBITRIL-VALSARTAN 49-51 MG PO TABS
1.0000 | ORAL_TABLET | Freq: Two times a day (BID) | ORAL | Status: DC
  Administered 2020-10-01 – 2020-10-02 (×2): 1 via ORAL
  Filled 2020-10-01 (×2): qty 1

## 2020-10-01 MED ORDER — SACUBITRIL-VALSARTAN 49-51 MG PO TABS: 1.0000 | ORAL_TABLET | Freq: Two times a day (BID) | ORAL | Status: DC

## 2020-10-01 MED ORDER — POTASSIUM CHLORIDE CRYS ER 20 MEQ PO TBCR
40.0000 meq | EXTENDED_RELEASE_TABLET | Freq: Every day | ORAL | Status: DC
  Administered 2020-10-02: 40 meq via ORAL
  Filled 2020-10-01: qty 2

## 2020-10-01 NOTE — Progress Notes (Signed)
Progress Note  Patient Name: Gregory [name] Date of Encounter: 10/01/2020  [facility] Cardiologist: [name], MD   Subjective   No chest pain or shortness of breath  Inpatient Medications    Scheduled Meds:  allopurinol  100 mg Oral Daily   aspirin EC  81 mg Oral Daily   atorvastatin  80 mg Oral Daily   furosemide  40 mg Intravenous BID   potassium chloride  40 mEq Oral TID   [START ON 10/02/2020] potassium chloride  40 mEq Oral Daily   sacubitril-valsartan  1 tablet Oral BID   sertraline  50 mg Oral Daily   sodium chloride flush  3 mL Intravenous Q12H   spironolactone  25 mg Oral Daily   Continuous Infusions:  sodium chloride     ferric gluconate (FERRLECIT) IVPB 250 mg (10/01/20 0823)   PRN Meds: sodium chloride, acetaminophen, ALPRAZolam, ondansetron (ZOFRAN) IV, sodium chloride flush, traMADol, traZODone   Vital Signs    Vitals:   09/30/20 2312 10/01/20 0325 10/01/20 0809 10/01/20 1119  BP: (!) 127/59 (!) 133/55 (!) 146/55 (!) 126/55  Pulse: (!) 41 (!) 49 (!) 47 (!) 53  Resp: 15 15 18 15   Temp: 98.2 F (36.8 C) (!) 97.3 F (36.3 C) 97.8 F (36.6 C) 98.4 F (36.9 C)  TempSrc: Oral Oral Oral Oral  SpO2: 90% 95% 96% 96%  Weight:      Height:        Intake/Output Summary (Last 24 hours) at 10/01/2020 1602 Last data filed at 10/01/2020 1158 Gross per 24 hour  Intake 3 ml  Output 2575 ml  Net -2572 ml    Last 3 Weights 09/29/2020 09/28/2020 09/22/2020  Weight (lbs) 225 lb 15.5 oz 234 lb 9.1 oz 242 lb 11.2 oz  Weight (kg) 102.5 kg 106.4 kg 110.088 kg      Telemetry    Complete heart block with a ventricular rate in the 50s- Personally Reviewed  ECG    No new EKGs - Personally Reviewed  Physical Exam   GEN: No acute distress.      Labs    High Sensitivity Troponin:  No results for input(s): TROPONINIHS in the last 720 hours.   Chemistry Recent Labs  Lab 09/28/20 1638 09/29/20 0141 09/30/20 0142 10/01/20 0121  NA 139 143 143  141  K 3.0* 3.1* 3.5 2.9*  CL 104 106 104 102  CO2 28 29 29 30   GLUCOSE 102* 113* 110* 90  BUN 10 9 9 8   CREATININE 0.75 0.89 0.77 0.80  CALCIUM 8.3* 8.5* 8.6* 8.3*  MG 1.9  --   --   --   PROT 5.3*  --   --   --   ALBUMIN 3.3*  --   --   --   AST 43*  --   --   --   ALT 24  --   --   --   ALKPHOS 41  --   --   --   BILITOT 1.9*  --   --   --   GFRNONAA >60 >60 >60 >60  ANIONGAP 7 8 10 9      Lipids No results for input(s): CHOL, TRIG, HDL, LABVLDL, LDLCALC, CHOLHDL in the last 168 hours.  Hematology Recent Labs  Lab 09/28/20 1638 09/30/20 0819  WBC 5.9 5.4  RBC 3.08* 3.84*  HGB 8.2* 10.2*  HCT 28.7* 35.5*  MCV 93.2 92.4  MCH 26.6 26.6  MCHC 28.6* 28.7*  RDW 17.3*  17.3*  PLT 223 216    Thyroid  Recent Labs  Lab 09/28/20 1638  TSH 1.620     BNP Recent Labs  Lab 09/28/20 1638  BNP 2,498.6*     DDimer No results for input(s): DDIMER in the last 168 hours.   Radiology      Cardiac Studies   09/29/20: TTE IMPRESSIONS   1. The aortic valve has been Intuity 25 mm valve in the aortic position.  There are two peri-valvular jets that are moderate in severity; in  summation suggestive of severe regurgitation. There is a 25 mm INTUITY  valve present in the aortic position.  Procedure Date: 03/06/2019.   2. Left ventricular ejection fraction, by estimation, is 40 to 45%. The  left ventricle has mildly decreased function. The left ventricle  demonstrates global hypokinesis. The left ventricular internal cavity size  was moderately dilated. There is mild  concentric left ventricular hypertrophy. Left ventricular diastolic  parameters are indeterminate.   3. Right ventricular systolic function is normal. The right ventricular  size is normal. There is normal pulmonary artery systolic pressure.   4. Left atrial size was severely dilated.   5. The mitral valve is normal in structure. No evidence of mitral valve  regurgitation.   6. Aortic dilatation noted.  There is mild dilatation of the aortic root,  measuring 44 mm. There is moderate dilatation of the ascending aorta,  measuring 45 mm.   Comparison(s): A prior study was performed on 12/07/2019. LV function is  slightly worse, LV dilation has dilated. Comparison of the apical 5  chamber view and PSAX view (which does not show both jets) shows worse  regurgitation despite being done at a lower   Nyquist limit. In comparison to post operative TEE, perivalvular leak  which was present psot op has worsened.   Conclusion(s)/Recommendation(s): TEE or cardiac MRI could be considered  for further quantification of valve dysfunction.    LONG TERM MONITOR (3-14 DAYS) Result Date: 09/28/2020 Predominant rhythm sinus bradycardia High degree heart block Mobitz Type I and II with probable episodes of complete heart block Junctional rhythm    12/07/19: TTE IMPRESSIONS   1. Left ventricular ejection fraction, by estimation, is 45%. The left  ventricle has mildly decreased function. The left ventricle demonstrates  regional wall motion abnormalities with septal-lateral dyssynchrony  consistent with LBBB and apical septal  and apical severe hypokinesis. The left ventricular internal cavity size  was mildly dilated. There is mild left ventricular hypertrophy. Left  ventricular diastolic parameters are consistent with Grade I diastolic  dysfunction (impaired relaxation).   2. Right ventricular systolic function is mildly reduced. The right  ventricular size is normal. There is normal pulmonary artery systolic  pressure. The estimated right ventricular systolic pressure is 22.6 mmHg.   3. Left atrial size was moderately dilated.   4. The mitral valve is normal in structure. No evidence of mitral valve  regurgitation. No evidence of mitral stenosis.   5. There is a bioprosthetic aortic valve. There is mild-moderate  peri-valvular leakage with 2 jets. Elevated mean gradient across the valve  at 23 mmHg,  similar to previous study.   6. Aortic dilatation noted. There is moderate dilatation of the ascending  aorta, measuring 44 mm.   7. The inferior vena cava is normal in size with greater than 50%  respiratory variability, suggesting right atrial pressure of 3 mmHg.      06/12/19: TTE IMPRESSIONS   1. Left ventricular ejection fraction,  by estimation, is 45 to 50%. The  left ventricle has mildly decreased function. The left ventricle  demonstrates regional wall motion abnormalities (see scoring  diagram/findings for description). There is moderate  concentric left ventricular hypertrophy. Left ventricular diastolic  parameters are consistent with Grade I diastolic dysfunction (impaired  relaxation).   2. Right ventricular systolic function is normal. The right ventricular  size is normal. There is mildly elevated pulmonary artery systolic  pressure.   3. Left atrial size was moderately dilated.   4. The mitral valve is normal in structure. Trivial mitral valve  regurgitation. No evidence of mitral stenosis.   5. The aortic valve has been repaired/replaced. Aortic valve  regurgitation is mild to moderate. There is a 25 mm [name] bovine valve  present in the aortic position. Procedure Date: 03/06/2019. Echo findings  are consistent with perivalvular leak of the  aortic prosthesis. Aortic regurgitation PHT measures 418 msec. Aortic  valve area, by VTI measures 1.08 cm. Aortic valve mean gradient measures  21.5 mmHg. Aortic valve Vmax measures 3.24 m/s.   6. Aortic dilatation noted. There is moderate dilatation of the ascending  aorta measuring 45 mm.   Conclusion(s)/Recommendation(s): S/P CABG and AVR. EF similar, wall motion  abnormalities as noted. AVR well seated, mild (visually) to moderate (PHT)  anterior perivalvular leak.      03/04/2019: LHC Prox RCA lesion is 70% stenosed. Mid RCA lesion is 95% stenosed. RV Branch lesion is 80% stenosed. Prox Cx lesion is 40%  stenosed. Prox LAD to Mid LAD lesion is 90% stenosed. Mid LAD lesion is 70% stenosed.   1. Severe calcific disease in the mid LAD 2. The Circumflex has mild to moderate proximal stenosis 3. The RCA is a large dominant vessel with severe proximal stenosis and severe mid stenosis with poor flow into the distal vessel.    Recommendations: He has complex disease in the mid LAD, diffuse complex disease in the proximal and mid RCA and moderate AI/AS. I suspect that his aortic root is enlarged.. Echo pending today. He is known to have at least moderate AS and AI by prior echo. I think he would benefit from surgical AVR and bypass of the LAD and RCA. We will arrange a cardiac CTA tomorrow to better assess his aortic root and ascending aorta. I have called CT surgery tonight and they will see him tomorrow.   Patient Profile     70 y.o. male with a hx of CAD/VHD w/severe AS (CABG/AVR 03/06/19), post op AFib started on amiodarone, known HIT, HTN, HLD, OSA admitted with worsening exertional capacity, weakness, DOE, and CHB and acute/chronic CHF  Assessment & Plan      CHB, rates remain 40's Aortic stenosis status post AVR with a perivalvular leak Valvular/ischemic cardiomyopathy   Patient will need device implantation.  Decision as to timing is deferred in the context of deciding what to do about his aortic valvular leak and whether will be done endovascularly or surgically.  We will follow from af[place] please call as needed  For questions or updates, please contact [place] Please consult [email or web address] for contact info under        Signed, Virl Axe, MD  10/01/2020, 4:02 PM

## 2020-10-01 NOTE — Progress Notes (Signed)
Progress Note  Patient Name: Gregory [name] Date of Encounter: 10/01/2020  Primary Cardiologist:   [name], MD   Subjective   Breathing OK.  No pain.    Inpatient Medications    Scheduled Meds:  allopurinol  100 mg Oral Daily   aspirin EC  81 mg Oral Daily   atorvastatin  80 mg Oral Daily   furosemide  40 mg Intravenous BID   sacubitril-valsartan  1 tablet Oral BID   sertraline  50 mg Oral Daily   sodium chloride flush  3 mL Intravenous Q12H   spironolactone  25 mg Oral Daily   Continuous Infusions:  sodium chloride     ferric gluconate (FERRLECIT) IVPB 250 mg (10/01/20 0823)   PRN Meds: sodium chloride, acetaminophen, ALPRAZolam, ondansetron (ZOFRAN) IV, sodium chloride flush, traMADol, traZODone   Vital Signs    Vitals:   09/30/20 2022 09/30/20 2312 10/01/20 0325 10/01/20 0809  BP: 104/61 (!) 127/59 (!) 133/55 (!) 146/55  Pulse: (!) 49 (!) 41 (!) 49 (!) 47  Resp: 17 15 15 18   Temp: 98.3 F (36.8 C) 98.2 F (36.8 C) (!) 97.3 F (36.3 C) 97.8 F (36.6 C)  TempSrc: Oral Oral Oral Oral  SpO2: 93% 90% 95% 96%  Weight:      Height:        Intake/Output Summary (Last 24 hours) at 10/01/2020 0941 Last data filed at 10/01/2020 0071 Gross per 24 hour  Intake 3 ml  Output 3650 ml  Net -3647 ml   Filed Weights   09/28/20 1515 09/29/20 0410  Weight: 106.4 kg 102.5 kg    Telemetry    SB, mobitz type I and 2:1 HB without sustained pauses - Personally Reviewed  ECG    NA - Personally Reviewed  Physical Exam   GEN: No  acute distress.   Neck: No  JVD Cardiac: RRR, harsh systolic murmur, diastolic murmur, rubs, or gallops.  Respiratory: Clear   to auscultation bilaterally. GI: Soft, nontender, non-distended, normal bowel sounds  MS:  Mild/mod improved edema; No deformity. Neuro:   Nonfocal  Psych: Oriented and appropriate      Labs    Chemistry Recent Labs  Lab 09/28/20 1638 09/29/20 0141 09/30/20 0142 10/01/20 0121  NA 139 143  143 141  K 3.0* 3.1* 3.5 2.9*  CL 104 106 104 102  CO2 28 29 29 30   GLUCOSE 102* 113* 110* 90  BUN 10 9 9 8   CREATININE 0.75 0.89 0.77 0.80  CALCIUM 8.3* 8.5* 8.6* 8.3*  PROT 5.3*  --   --   --   ALBUMIN 3.3*  --   --   --   AST 43*  --   --   --   ALT 24  --   --   --   ALKPHOS 41  --   --   --   BILITOT 1.9*  --   --   --   GFRNONAA >60 >60 >60 >60  ANIONGAP 7 8 10 9      Hematology Recent Labs  Lab 09/28/20 1638 09/30/20 0819  WBC 5.9 5.4  RBC 3.08* 3.84*  HGB 8.2* 10.2*  HCT 28.7* 35.5*  MCV 93.2 92.4  MCH 26.6 26.6  MCHC 28.6* 28.7*  RDW 17.3* 17.3*  PLT 223 216    Cardiac EnzymesNo results for input(s): TROPONINI in the last 168 hours. No results for input(s): TROPIPOC in the last 168 hours.   BNP Recent Labs  Lab 09/28/20  1638  BNP 2,498.6*     DDimer No results for input(s): DDIMER in the last 168 hours.   Radiology    CT CORONARY MORPH W/CTA COR W/SCORE W/CA W/CM &/OR WO/CM  Addendum Date: 09/30/2020   ADDENDUM REPORT: 09/30/2020 20:00 CLINICAL DATA:  Prosthetic valve regurgitation. 25 mm [name] Intuity bioprosthetic valve with severe regurgitation. EXAM: Cardiac TAVR CT TECHNIQUE: A non-contrast, gated CT scan was obtained with axial slices of 3 mm through the heart for aortic valve calcium scoring. A 120 kV retrospective, gated, contrast cardiac scan was obtained. Gantry rotation speed was 250 msecs and collimation was 0.6 mm. Nitroglycerin was not given. The 3D data set was reconstructed in 5% intervals of the 0-95% of the R-R cycle. Systolic and diastolic phases were analyzed on a dedicated workstation using MPR, MIP, and VRT modes. The patient received 100 cc of contrast. FINDINGS: Image quality: Excellent. Noise artifact is: Limited. Aortic Prosthesis: A 25 mm [name] Intuity bioprosthetic valve is present in the aortic position. The leaflets of the prosthesis are freely mobile without evidence of leaflet thickening or thrombosis. There are 2 areas of  paravalvular leak under the RCC and LCC related. The leak under the RCC measures ~3.7 mm x ~8.9 mm. The leak under the [place] is ~3.8 mm x 9.1 mm. The leak under then [facility] is likely related to heavy annular calcifications under the [place]. 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 Sinotubular Junction: 41 mm (double oblique). Ascending Thoracic Aorta: 41 mm (double oblique) Coronary Arteries: Normal coronary origin. Right dominance. The study was performed without use of NTG and is insufficient for plaque evaluation. A LIMA to the LAD is seen. A RIMA to the RCA is seen. 3-vessel coronary calcifications are seen in the native coronaries. Right Atrium: Right atrial size is dilated. Right Ventricle: The right ventricular cavity is dilated. Left Atrium: Left atrial size is normal in size with no left atrial appendage filling defect. Left Ventricle: The ventricular cavity size is severely dilated however, the entire left ventricle was not included in the FOV. Pulmonary arteries: Dilated pulmonary artery suggestive of pulmonary hypertension. Pulmonary veins: Normal pulmonary venous drainage. Pericardium: Normal thickness with no significant effusion or calcium present. Mitral Valve: The mitral valve is normal structure without significant calcification. Extra-cardiac findings: See attached radiology report for non-cardiac structures. IMPRESSION: 1. 25 mm [name] Intuity bioprosthetic valve with 2 identified paravalvular leaks under the RCC and LCC as detailed above. 2. 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 3. S/p CABG.  LIMA-LAD and RIMA-RCA are seen. 4. Severely dilated left ventricular cavity. 5. Dilated pulmonary artery suggestive of pulmonary hypertension. [place] T. Audie Box, MD Electronically Signed   By: Eleonore Chiquito M.D.   On: 09/30/2020 20:00   Result Date: 09/30/2020 EXAM: OVER-READ INTERPRETATION  CT CHEST The following report is an over-read performed by radiologist Dr. Vinnie Langton of  [facility], [place] on 09/30/2020. This over-read does not include interpretation of cardiac or coronary anatomy or pathology. The coronary calcium score/coronary CTA interpretation by the cardiologist is attached. COMPARISON:  Chest CT 03/05/2019. FINDINGS: Extracardiac findings will be described separately under dictation for contemporaneously obtained CTA chest, abdomen and pelvis. IMPRESSION: Please see separate dictation for contemporaneously obtained CTA chest, abdomen and pelvis dated 09/30/2020 for full description of relevant extracardiac findings. Electronically Signed: By: Vinnie Langton M.D. On: 09/30/2020 13:45   CT ANGIO CHEST AORTA W/CM & OR WO/CM  Result Date: 09/30/2020 CLINICAL DATA:  70 year old male  with history of severe aortic stenosis. Preprocedural study prior to potential transcatheter aortic valve replacement (TAVR) procedure. EXAM: CT ANGIOGRAPHY CHEST, ABDOMEN AND PELVIS TECHNIQUE: Multidetector CT imaging through the chest, abdomen and pelvis was performed using the standard protocol during bolus administration of intravenous contrast. Multiplanar reconstructed images and MIPs were obtained and reviewed to evaluate the vascular anatomy. CONTRAST:  172mL OMNIPAQUE IOHEXOL 350 MG/ML SOLN COMPARISON:  Chest CT 03/05/2019. FINDINGS: CTA CHEST FINDINGS Cardiovascular: Heart size is mildly enlarged. There is no significant pericardial fluid, thickening or pericardial calcification. There is aortic atherosclerosis, as well as atherosclerosis of the great vessels of the mediastinum and the coronary arteries, including calcified atherosclerotic plaque in the left main, left anterior descending, left circumflex and right coronary arteries. Status post median sternotomy for CABG including LIMA to the LAD. Patient is also status post aortic valve replacement with what appears to be a stented bioprosthesis. 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 Mild  aneurysmal dilatation of the ascending thoracic aorta (4.5 cm in diameter). Mediastinum/Lymph Nodes: No pathologically enlarged mediastinal or hilar lymph nodes. Esophagus is unremarkable in appearance. No axillary lymphadenopathy. Lungs/Pleura: No suspicious appearing pulmonary nodules or masses are noted. No acute consolidative airspace disease. Small bilateral pleural effusions lying dependently. Musculoskeletal/Soft Tissues: There are no aggressive appearing lytic or blastic lesions noted in the visualized portions of the skeleton. CTA ABDOMEN AND PELVIS FINDINGS Hepatobiliary: No suspicious cystic or solid hepatic lesions. No intra or extrahepatic biliary ductal dilatation. Tiny calcified gallstone lying dependently in the gallbladder. No findings to suggest an acute cholecystitis at this time. Pancreas: No pancreatic mass. No pancreatic ductal dilatation. No pancreatic or peripancreatic fluid collections or inflammatory changes. Spleen: Unremarkable. Adrenals/Urinary Tract: 1.4 cm low-attenuation lesion in the interpolar region of the left kidney is compatible with a simple cyst. Mild bilateral renal atrophy. Bilateral perinephric stranding (nonspecific). Right kidney and bilateral adrenal glands are otherwise unremarkable in appearance. No hydroureteronephrosis. Urinary bladder is normal in appearance. Stomach/Bowel: The appearance of the stomach is normal. No pathologic dilatation of small bowel or colon. Numerous colonic diverticulae are noted, without definite surrounding inflammatory changes to clearly indicate an acute diverticulitis at this time. Normal appendix. Vascular/Lymphatic: Aortic atherosclerosis, without evidence of aneurysm or dissection in the abdominal or pelvic vasculature. No lymphadenopathy noted in the abdomen or pelvis. Reproductive: Prostate gland and seminal vesicles are unremarkable in appearance. Other: No significant volume of ascites.  No pneumoperitoneum. Musculoskeletal: There  are no aggressive appearing lytic or blastic lesions noted in the visualized portions of the skeleton. VASCULAR MEASUREMENTS PERTINENT TO TAVR: AORTA: Minimal Aortic Diameter-22 x 19 mm Severity of Aortic Calcification-moderate RIGHT PELVIS: Right Common Iliac Artery - Minimal Diameter-11.8 x 13.3 mm Tortuosity-mild Calcification-moderate to severe Right External Iliac Artery - Minimal Diameter-10.9 x 11.3 mm Tortuosity-severe Calcification-none Right Common Femoral Artery - Minimal Diameter-11.9 x 10.9 mm Tortuosity-mild Calcification-mild LEFT PELVIS: Left Common Iliac Artery - Minimal Diameter-14.8 x 13.9 mm Tortuosity-mild Calcification-moderate to severe Left External Iliac Artery - Minimal Diameter-11.2 x 12.0 mm Tortuosity-moderate Calcification-none Left Common Femoral Artery - Minimal Diameter-10.7 x 10.9 mm Tortuosity-mild Calcification-mild Review of the MIP images confirms the above findings. IMPRESSION: 1. Vascular findings and measurements pertinent to potential TAVR procedure, as detailed above. 2. Status post aortic valve replacement with what appears to be a stented bioprosthesis. 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 Ascending thoracic aortic aneurysm. Recommend semi-annual imaging  followup by CTA or MRA and referral to cardiothoracic surgery if not already obtained. This recommendation follows 2010 ACCF/AHA/AATS/ACR/ASA/SCA/SCAI/SIR/STS/SVM Guidelines for the Diagnosis and Management of Patients With Thoracic Aortic Disease. Circulation. 2010; 121: B096-G836. Aortic aneurysm NOS (ICD10-I71.9). 3. Aortic atherosclerosis, in addition to left main and 3 vessel coronary artery disease. Status post median sternotomy for CABG including LIMA to the LAD. 4. Mild cardiomegaly. 5. Small bilateral pleural effusions lying dependently. 6. Colonic diverticulosis without evidence of acute diverticulitis at this time. 7.  Additional incidental findings, as above. Electronically Signed   By: Vinnie Langton M.D.   On: 09/30/2020 14:11   ECHOCARDIOGRAM COMPLETE  Result Date: 09/29/2020    ECHOCARDIOGRAM REPORT   Patient Name:   Gregory [name] Date of Exam: 09/29/2020 Medical Rec #:  [id number]         Height:       66.0 in Accession #:    [id number]        Weight:       226.0 lb Date of Birth:  09-Feb-1950         BSA:          2.106 m Patient Age:    6 years          BP:           160/59 mmHg Patient Gender: M                 HR:           41 bpm. Exam Location:  Inpatient Procedure: 2D Echo, Cardiac Doppler and Color Doppler Indications:    Dyspnea R06.00  History:        Patient has prior history of Echocardiogram examinations, most                 recent 12/07/2019. CAD, Aortic Valve Disease, Arrythmias:LBBB and                 Complete Heart Block, Signs/Symptoms:Shortness of Breath; Risk                 Factors:Hypertension, Dyslipidemia and Sleep Apnea.                 Aortic Valve: 25 mm INTUITY valve is present in the aortic                 position. Procedure Date: 03/06/2019.  Sonographer:    Darlina Sicilian RDCS Referring Phys: [id number] [place]  1. The aortic valve has been Intuity 25 mm valve in the aortic position. There are two peri-valvular jets that are moderate in severity; in summation suggestive of severe regurgitation. There is a 25 mm INTUITY valve present in the aortic position. Procedure Date: 03/06/2019.  2. Left ventricular ejection fraction, by estimation, is 40 to 45%. The left ventricle has mildly decreased function. The left ventricle demonstrates global hypokinesis. The left ventricular internal cavity size was moderately dilated. There is mild concentric left ventricular hypertrophy. Left ventricular diastolic parameters are indeterminate.  3. Right ventricular systolic function is normal. The right ventricular size is normal. There is normal pulmonary artery systolic pressure.   4. Left atrial size was severely dilated.  5. The mitral valve is normal in structure. No evidence of mitral valve regurgitation.  6. Aortic dilatation noted. There is mild dilatation of the aortic root, measuring 44 mm. There is moderate dilatation of the ascending aorta, measuring 45 mm. Comparison(s): A prior study was performed on 12/07/2019.  LV function is slightly worse, LV dilation has dilated. Comparison of the apical 5 chamber view and PSAX view (which does not show both jets) shows worse regurgitation despite being done at a lower  Nyquist limit. In comparison to post operative TEE, perivalvular leak which was present psot op has worsened. Conclusion(s)/Recommendation(s): TEE or cardiac MRI could be considered for further quantification of valve dysfunction. FINDINGS  Left Ventricle: Left ventricular ejection fraction, by estimation, is 40 to 45%. The left ventricle has mildly decreased function. The left ventricle demonstrates global hypokinesis. The left ventricular internal cavity size was moderately dilated. There is mild concentric left ventricular hypertrophy. Abnormal (paradoxical) septal motion, consistent with left bundle branch block. Left ventricular diastolic parameters are indeterminate. Right Ventricle: The right ventricular size is normal. Right vetricular wall thickness was not well visualized. Right ventricular systolic function is normal. There is normal pulmonary artery systolic pressure. The tricuspid regurgitant velocity is 2.62 m/s, and with an assumed right atrial pressure of 3 mmHg, the estimated right ventricular systolic pressure is 09.6 mmHg. Left Atrium: Left atrial size was severely dilated. Right Atrium: Right atrial size was normal in size. Pericardium: There is no evidence of pericardial effusion. Mitral Valve: The mitral valve is normal in structure. No evidence of mitral valve regurgitation. Tricuspid Valve: The tricuspid valve is normal in structure. Tricuspid valve  regurgitation is trivial. Aortic Valve: The aortic valve has been repaired/replaced. Aortic valve regurgitation is severe. Aortic regurgitation PHT measures 691 msec. Aortic valve mean gradient measures 19.0 mmHg. Aortic valve peak gradient measures 42.0 mmHg. Aortic valve area, by VTI measures 1.70 cm. There is a 25 mm INTUITY valve present in the aortic position. Procedure Date: 03/06/2019. Pulmonic Valve: The pulmonic valve was not well visualized. Pulmonic valve regurgitation is trivial. No evidence of pulmonic stenosis. Aorta: Aortic dilatation noted. There is mild dilatation of the aortic root, measuring 44 mm. There is moderate dilatation of the ascending aorta, measuring 45 mm. IAS/Shunts: The atrial septum is grossly normal.  LEFT VENTRICLE PLAX 2D LVIDd:         6.70 cm      Diastology LVIDs:         5.30 cm      LV e' medial:    6.57 cm/s LV PW:         1.30 cm      LV E/e' medial:  17.5 LV IVS:        1.50 cm      LV e' lateral:   9.02 cm/s LVOT diam:     2.60 cm      LV E/e' lateral: 12.8 LV SV:         98 LV SV Index:   47 LVOT Area:     5.31 cm  LV Volumes (MOD) LV vol d, MOD A2C: 390.0 ml LV vol d, MOD A4C: 355.0 ml LV vol s, MOD A2C: 181.0 ml LV vol s, MOD A4C: 201.0 ml LV SV MOD A2C:     209.0 ml LV SV MOD A4C:     355.0 ml LV SV MOD BP:      180.5 ml RIGHT VENTRICLE RV S prime:     8.66 cm/s TAPSE (M-mode): 1.5 cm LEFT ATRIUM              Index       RIGHT ATRIUM           Index LA diam:        5.40 cm  2.56 cm/m  RA Area:     15.10 cm LA Vol (A2C):   128.0 ml 60.77 ml/m RA Volume:   32.90 ml  15.62 ml/m LA Vol (A4C):   139.0 ml 65.99 ml/m LA Biplane Vol: 136.0 ml 64.57 ml/m  AORTIC VALVE AV Area (Vmax):    1.35 cm AV Area (Vmean):   1.57 cm AV Area (VTI):     1.70 cm AV Vmax:           324.00 cm/s AV Vmean:          183.200 cm/s AV VTI:            0.580 m AV Peak Grad:      42.0 mmHg AV Mean Grad:      19.0 mmHg LVOT Vmax:         82.50 cm/s LVOT Vmean:        54.250 cm/s LVOT VTI:           0.186 m LVOT/AV VTI ratio: 0.32 AI PHT:            691 msec  AORTA Ao Root diam: 4.40 cm Ao Asc diam:  4.55 cm MITRAL VALVE                TRICUSPID VALVE MV Area (PHT): 3.92 cm     TR Peak grad:   27.5 mmHg MV Decel Time: 194 msec     TR Vmax:        262.00 cm/s MV E velocity: 115.08 cm/s                             SHUNTS                             Systemic VTI:  0.19 m                             Systemic Diam: 2.60 cm Rudean Haskell MD Electronically signed by Rudean Haskell MD Signature Date/Time: 09/29/2020/2:43:41 PM    Final    CT Angio Abd/Pel w/ and/or w/o  Result Date: 09/30/2020 CLINICAL DATA:  70 year old male with history of severe aortic stenosis. Preprocedural study prior to potential transcatheter aortic valve replacement (TAVR) procedure. EXAM: CT ANGIOGRAPHY CHEST, ABDOMEN AND PELVIS TECHNIQUE: Multidetector CT imaging through the chest, abdomen and pelvis was performed using the standard protocol during bolus administration of intravenous contrast. Multiplanar reconstructed images and MIPs were obtained and reviewed to evaluate the vascular anatomy. CONTRAST:  154mL OMNIPAQUE IOHEXOL 350 MG/ML SOLN COMPARISON:  Chest CT 03/05/2019. FINDINGS: CTA CHEST FINDINGS Cardiovascular: Heart size is mildly enlarged. There is no significant pericardial fluid, thickening or pericardial calcification. There is aortic atherosclerosis, as well as atherosclerosis of the great vessels of the mediastinum and the coronary arteries, including calcified atherosclerotic plaque in the left main, left anterior descending, left circumflex and right coronary arteries. Status post median sternotomy for CABG including LIMA to the LAD. Patient is also status post aortic valve replacement with what appears to be a stented bioprosthesis. 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 Mild aneurysmal dilatation of the ascending thoracic aorta (4.5 cm in diameter). Mediastinum/Lymph  Nodes: No pathologically enlarged mediastinal or hilar lymph nodes. Esophagus is unremarkable in appearance. No axillary lymphadenopathy. Lungs/Pleura: No suspicious appearing pulmonary nodules or masses are noted.  No acute consolidative airspace disease. Small bilateral pleural effusions lying dependently. Musculoskeletal/Soft Tissues: There are no aggressive appearing lytic or blastic lesions noted in the visualized portions of the skeleton. CTA ABDOMEN AND PELVIS FINDINGS Hepatobiliary: No suspicious cystic or solid hepatic lesions. No intra or extrahepatic biliary ductal dilatation. Tiny calcified gallstone lying dependently in the gallbladder. No findings to suggest an acute cholecystitis at this time. Pancreas: No pancreatic mass. No pancreatic ductal dilatation. No pancreatic or peripancreatic fluid collections or inflammatory changes. Spleen: Unremarkable. Adrenals/Urinary Tract: 1.4 cm low-attenuation lesion in the interpolar region of the left kidney is compatible with a simple cyst. Mild bilateral renal atrophy. Bilateral perinephric stranding (nonspecific). Right kidney and bilateral adrenal glands are otherwise unremarkable in appearance. No hydroureteronephrosis. Urinary bladder is normal in appearance. Stomach/Bowel: The appearance of the stomach is normal. No pathologic dilatation of small bowel or colon. Numerous colonic diverticulae are noted, without definite surrounding inflammatory changes to clearly indicate an acute diverticulitis at this time. Normal appendix. Vascular/Lymphatic: Aortic atherosclerosis, without evidence of aneurysm or dissection in the abdominal or pelvic vasculature. No lymphadenopathy noted in the abdomen or pelvis. Reproductive: Prostate gland and seminal vesicles are unremarkable in appearance. Other: No significant volume of ascites.  No pneumoperitoneum. Musculoskeletal: There are no aggressive appearing lytic or blastic lesions noted in the visualized portions of the  skeleton. VASCULAR MEASUREMENTS PERTINENT TO TAVR: AORTA: Minimal Aortic Diameter-22 x 19 mm Severity of Aortic Calcification-moderate RIGHT PELVIS: Right Common Iliac Artery - Minimal Diameter-11.8 x 13.3 mm Tortuosity-mild Calcification-moderate to severe Right External Iliac Artery - Minimal Diameter-10.9 x 11.3 mm Tortuosity-severe Calcification-none Right Common Femoral Artery - Minimal Diameter-11.9 x 10.9 mm Tortuosity-mild Calcification-mild LEFT PELVIS: Left Common Iliac Artery - Minimal Diameter-14.8 x 13.9 mm Tortuosity-mild Calcification-moderate to severe Left External Iliac Artery - Minimal Diameter-11.2 x 12.0 mm Tortuosity-moderate Calcification-none Left Common Femoral Artery - Minimal Diameter-10.7 x 10.9 mm Tortuosity-mild Calcification-mild Review of the MIP images confirms the above findings. IMPRESSION: 1. Vascular findings and measurements pertinent to potential TAVR procedure, as detailed above. 2. Status post aortic valve replacement with what appears to be a stented bioprosthesis. 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 Ascending thoracic aortic aneurysm. Recommend semi-annual imaging followup by CTA or MRA and referral to cardiothoracic surgery if not already obtained. This recommendation follows 2010 ACCF/AHA/AATS/ACR/ASA/SCA/SCAI/SIR/STS/SVM Guidelines for the Diagnosis and Management of Patients With Thoracic Aortic Disease. Circulation. 2010; 121: H631-S970. Aortic aneurysm NOS (ICD10-I71.9). 3. Aortic atherosclerosis, in addition to left main and 3 vessel coronary artery disease. Status post median sternotomy for CABG including LIMA to the LAD. 4. Mild cardiomegaly. 5. Small bilateral pleural effusions lying dependently. 6. Colonic diverticulosis without evidence of acute diverticulitis at this time. 7. Additional incidental findings, as above. Electronically Signed   By: Vinnie Langton M.D.   On:  09/30/2020 14:11    Cardiac Studies   ECHO:      Patient Profile     70 y.o. male with CAD/CABG, AVR, mildly reduced EF who presented with symptomatic bradycardia and acute on chronic systolic and diastolic HF.  Also being treated for anemia.    Assessment & Plan    BRADYCARDIA:    See previous note.  He has follow up with me on 10/6 and Dr. Quentin Ore on 10/16.  He is to let us know if he has any symptomatic bradycardia.  He will remain off of beta blocker.    HTN:  Started Entresto.   I started spironolactone as well.   I will increase Entresto today.    ISCHEMIC CM:   EF listed at 45% but I think it appears lower than than and lower than previous.  (I would suggest that the EF is about 20%. )  Management as above.    ACUTE SYSTOLIC HF:    Net negative 10.2 liters since admission.     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  Further treatment and work up per Dr. Lindi Adie.    Hgb is improved.   HYPOKALEMIA:    Supplemented.  Will give increased dose again today.      CAD:  No active ischemia.     AVR:  He has had perivalvular lead noted.  Visually looks more severe than previous.  He will have follow up with Dr. Burt Knack and a scheduled TEE.  We are working on the timing of the latter.  CT was completed as above.     HIT: He is to avoid all heparin products.  For questions or updates, please contact [place] Please consult [email or web address] for contact info under Cardiology/STEMI.   Signed, [name], MD  10/01/2020, 9:41 AM

## 2020-10-02 ENCOUNTER — Encounter: Payer: Self-pay | Admitting: Hematology and Oncology

## 2020-10-02 ENCOUNTER — Other Ambulatory Visit: Payer: Self-pay | Admitting: Physician Assistant

## 2020-10-02 DIAGNOSIS — I509 Heart failure, unspecified: Secondary | ICD-10-CM | POA: Diagnosis not present

## 2020-10-02 DIAGNOSIS — Z79899 Other long term (current) drug therapy: Secondary | ICD-10-CM

## 2020-10-02 LAB — BASIC METABOLIC PANEL
Anion gap: 9 (ref 5–15)
BUN: 11 mg/dL (ref 8–23)
CO2: 28 mmol/L (ref 22–32)
Calcium: 8.6 mg/dL — ABNORMAL LOW (ref 8.9–10.3)
Chloride: 104 mmol/L (ref 98–111)
Creatinine, Ser: 0.81 mg/dL (ref 0.61–1.24)
GFR, Estimated: >60 mL/min (ref >60)
Glucose, Bld: 104 mg/dL — ABNORMAL HIGH (ref 70–99)
Potassium: 3.9 mmol/L (ref 3.5–5.1)
Sodium: 141 mmol/L (ref 135–145)

## 2020-10-02 MED ORDER — POTASSIUM CHLORIDE CRYS ER 20 MEQ PO TBCR: 20.0000 meq | EXTENDED_RELEASE_TABLET | Freq: Every day | ORAL | 2 refills | Status: DC

## 2020-10-02 MED ORDER — SPIRONOLACTONE 25 MG PO TABS: 25.0000 mg | ORAL_TABLET | Freq: Every day | ORAL | 3 refills | Status: DC

## 2020-10-02 MED ORDER — SACUBITRIL-VALSARTAN 49-51 MG PO TABS: 1.0000 | ORAL_TABLET | Freq: Two times a day (BID) | ORAL | 5 refills | Status: DC

## 2020-10-02 MED ORDER — FUROSEMIDE 20 MG PO TABS: ORAL_TABLET | ORAL | 3 refills | Status: DC

## 2020-10-02 NOTE — Progress Notes (Signed)
Received referral to assist with home O2. Met with pt. He reports that he doesn't have a preference for an agency for the O2. Contacted Jermaine with [employer] for referral and he accepted it.

## 2020-10-02 NOTE — Discharge Summary (Addendum)
Discharge Summary    Patient ID: Gregory [name] MRN: [id number]; DOB: [birth year]  Admit date: 09/28/2020 Discharge date: 10/02/2020  PCP:  Jinny Sanders, MD   [facility] Providers Cardiologist:  [name], MD  Cardiology APP:  Lendon Colonel, NP       Discharge Diagnoses    Principal Problem:   CHF (congestive heart failure) [facility]) Active Problems:   Essential hypertension   Aortic insufficiency   Anemia   High cholesterol   Prediabetes   S/P aortic valve replacement   Atrial fibrillation ([place])   S/P CABG x 3   Heart block AV complete ([place])    Diagnostic Studies/Procedures    Echo 09/29/2020  1. The aortic valve has been Intuity 25 mm valve in the aortic position.  There are two peri-valvular jets that are moderate in severity; in  summation suggestive of severe regurgitation. There is a 25 mm INTUITY  valve present in the aortic position.  Procedure Date: 03/06/2019.   2. Left ventricular ejection fraction, by estimation, is 40 to 45%. The  left ventricle has mildly decreased function. The left ventricle  demonstrates global hypokinesis. The left ventricular internal cavity size  was moderately dilated. There is mild  concentric left ventricular hypertrophy. Left ventricular diastolic  parameters are indeterminate.   3. Right ventricular systolic function is normal. The right ventricular  size is normal. There is normal pulmonary artery systolic pressure.   4. Left atrial size was severely dilated.   5. The mitral valve is normal in structure. No evidence of mitral valve  regurgitation.   6. Aortic dilatation noted. There is mild dilatation of the aortic root,  measuring 44 mm. There is moderate dilatation of the ascending aorta,  measuring 45 mm.   Comparison(s): A prior study was performed on 12/07/2019. LV function is  slightly worse, LV dilation has dilated. Comparison of the apical 5  chamber view and PSAX view (which does not show both  jets) shows worse  regurgitation despite being done at a lower   Nyquist limit. In comparison to post operative TEE, perivalvular leak  which was present psot op has worsened.  _____________   History of Present Illness     WALFRED BETTENDORF is a 70 y.o. male with CAD/CABG and AVR.   He had a cardiac catheterization during recent hospitalization on 03/04/2019 which revealed severe calcific disease in the mid LAD, the circumflex had mild to moderate proximal stenosis, the RCA, a large dominant vessel, had severe proximal stenosis and severe mid stenosis with poor flow into the distal vessel.  He was also found to have severe aortic regurgitation and moderate aortic valve stenosis, with a mean gradient across aortic valve 58mmHg with AVA, but judged to be severe aortic insufficiency in the setting of dilated aortic root with high flow from AI leading to elevated gradient across aortic valve.  He underwent coronary artery bypass graft with aortic valve replacement on 03/06/2019.  The patient had LIMA to LAD, left radial artery to PDA, predicted RIMA to the right posterior lateral artery, with open left radial artery harvest, bilateral IMA harvesting.  Also, 25 mm [name] Intuity Bovine bioprosthetic aortic valve placement.   Of note, the patient had a severe reaction to heparin with HIT and severe profound thrombocytopenia postoperatively. He had postoperative atrial fibrillation and therefore was treated for a while with warfarin and amiodarone.  He did have a left arm hematoma.  Started on amiodarone 200 mg daily.  He  had a left arm hematoma and an elevated INR.     Of note on a follow an echo he had a small perivalvular leak.  His aortic valve replacement was stable although his ejection fraction was 45%.  He was to have follow-up of this later this year.   He was to have  knee surgery.  However, this was recently canceled because he was found to be anemic.  He has had no active bleeding that he knows of.   He had previous iron deficiency anemia.  He has since seen Dr. Lindi Adie who was planning iron and actually will plan on giving him iron infusion this admission.  Etiology of his iron deficiency is not clear.  I do see that he is got a elevated LDH.  His liver enzymes are fairly unremarkable.     When I last saw the patient he was having some increased swelling.  He was bradycardic with 2-1 heart block.  I put a monitor on him which shows high degree heart block with occasional junctional beats.  He is on a very low-dose beta-blocker and has baseline conduction disturbance.  I called him to discuss this yesterday.  He has been having increased swelling in his lower extremities.  He has gained about 6 pounds.  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  However, his wife says that he has been more short of breath just getting dressed.  He is not describing classic PND or orthopnea but he does get up and sleep in a recliner.  He feels anxious.  He is not having any presyncope or syncope.  He is not really feeling his heart racing or skipping.  He does acknowledge the increased edema.  He has had a little abdominal distention as well.  I called him to convince him to come into the hospital for diuresis, echocardiography follow-up his LV dysfunction and his aortic valve replacement as well as EP consultation for permanent pacemaker placement.    Hospital Course     Consultants: Dr. Lindi Adie of hematology service, EP service Dr. Quentin Ore  He was directly admitted to cardiology service on 09/28/2020.  He underwent IV diuresis with good urinary output.  Spironolactone was added to his medical regimen.  Entresto was increased.  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  He was treated with Feraheme in the hospital.  Echocardiogram obtained on 9/29 revealed EF 40 to 45%, global hypokinesis, mild LVH, severe LAE, mildly dilated  aortic root measuring at 44 mm, bioprosthetic aortic valve present with perivalvular jet moderate in severity, in summation, this suggestive of severe AI.  Echocardiogram was personally reviewed by Dr. Percival Spanish who felt the ejection fraction was lower than the previous echo, he felt the EF was more around 20%.  EP service was consulted on 09/29/2020 given his bradycardia and signs of advanced heart block seen on the recent heart monitor.  It was felt that PPM was indicated, however further plan will be determined by his surgical/interventional plan for his significant perivalvular leakage.  Additional work-up will be done as outpatient by Dr. Burt Knack, potentially with a outpatient TEE.  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  CTA of  the chest, abdomen and pelvis were also obtained which suggested aneurysmal dilatation of sinuses of Valsalva measuring at 5 cm in diameter, three-vessel coronary artery disease.  Patient was seen in the morning of 10/02/2020 by Dr. Percival Spanish, since he is largely asymptomatic with intermittent complete heart block, pacemaker implantation was felt to be nonurgent and will be deferred until a decision made regarding his significant perivalvular leakage.  Patient is deemed stable for discharge from cardiac perspective.  He has been set up with home oxygen.  During this hospitalization, his Lasix has been increased.  He will require basic metabolic panel next Wednesday prior to his Thursday follow-up with Dr. Percival Spanish.  Patient has been instructed to seek urgent medical attention if he develops any symptomatic bradycardia with severe dizziness, blurred vision or feeling of passing out.   Did the patient have an  acute coronary syndrome (MI, NSTEMI, STEMI, etc) this admission?:  No                               Did the patient have a percutaneous coronary intervention (stent / angioplasty)?:  No.       _____________  Discharge Vitals Blood pressure (!) 149/54, pulse (!) 41, temperature 97.8 F (36.6 C), temperature source Oral, resp. rate 20, height 5\' 6"  (1.676 m), weight 102.5 kg, SpO2 91 %.  Filed Weights   09/28/20 1515 09/29/20 0410  Weight: 106.4 kg 102.5 kg    Labs & Radiologic Studies    CBC Recent Labs    09/30/20 0819  WBC 5.4  NEUTROABS 3.4  HGB 10.2*  HCT 35.5*  MCV 92.4  PLT 782   Basic Metabolic Panel Recent Labs    10/01/20 0121 10/02/20 0157  NA 141 141  K 2.9* 3.9  CL 102 104  CO2 30 28  GLUCOSE 90 104*  BUN 8 11  CREATININE 0.80 0.81  CALCIUM 8.3* 8.6*   Liver Function Tests No results for input(s): AST, ALT, ALKPHOS, BILITOT, PROT, ALBUMIN in the last 72 hours. No results for input(s): LIPASE, AMYLASE in the last 72 hours. High Sensitivity Troponin:   No results for input(s): TROPONINIHS in the last 720 hours.  BNP Invalid input(s): POCBNP D-Dimer No results for input(s): DDIMER in the last 72 hours. Hemoglobin A1C No results for input(s): HGBA1C in the last 72 hours. Fasting Lipid Panel No results for input(s): CHOL, HDL, LDLCALC, TRIG, CHOLHDL, LDLDIRECT in the last 72 hours. Thyroid Function Tests No results for input(s): TSH, T4TOTAL, T3FREE, THYROIDAB in the last 72 hours.  Invalid input(s): FREET3 _____________  DG Chest 2 View  Result Date: 09/29/2020 CLINICAL DATA:  Shortness of breath EXAM: CHEST - 2 VIEW COMPARISON:  Chest x-ray dated March 16, 2019 FINDINGS: Increased cardiac contours when compared with 2021 prior exams. Bibasilar opacities, likely atelectasis. No evidence of pleural effusion or pneumothorax. IMPRESSION: Increased cardiac contours when compared with 2021 prior exams. Correlate with echocardiography. Bibasilar  atelectasis.  Lungs otherwise clear. Electronically Signed   By: Yetta Glassman M.D.   On: 09/29/2020 08:18   CT CORONARY MORPH W/CTA COR W/SCORE W/CA W/CM &/OR WO/CM  Addendum Date: 09/30/2020   ADDENDUM REPORT: 09/30/2020 20:00 CLINICAL DATA:  Prosthetic valve regurgitation. 25 mm [name] Intuity bioprosthetic valve with severe regurgitation. EXAM: Cardiac TAVR CT TECHNIQUE: A non-contrast, gated CT scan was obtained with axial slices of 3 mm through the heart for aortic valve calcium scoring. A 120 kV retrospective,  gated, contrast cardiac scan was obtained. Gantry rotation speed was 250 msecs and collimation was 0.6 mm. Nitroglycerin was not given. The 3D data set was reconstructed in 5% intervals of the 0-95% of the R-R cycle. Systolic and diastolic phases were analyzed on a dedicated workstation using MPR, MIP, and VRT modes. The patient received 100 cc of contrast. FINDINGS: Image quality: Excellent. Noise artifact is: Limited. Aortic Prosthesis: A 25 mm [name] Intuity bioprosthetic valve is present in the aortic position. The leaflets of the prosthesis are freely mobile without evidence of leaflet thickening or thrombosis. There are 2 areas of paravalvular leak under the RCC and LCC related. The leak under the RCC measures ~3.7 mm x ~8.9 mm. The leak under the [place] is ~3.8 mm x 9.1 mm. The leak under then [facility] is likely related to heavy annular calcifications under the [place]. 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 Sinotubular Junction: 41 mm (double oblique). Ascending Thoracic Aorta: 41 mm (double oblique) Coronary Arteries: Normal coronary origin. Right dominance. The study was performed without use of NTG and is insufficient for plaque evaluation. A LIMA to the LAD is seen. A RIMA to the RCA is seen. 3-vessel coronary calcifications are seen in the native coronaries. Right Atrium: Right atrial size is dilated. Right Ventricle: The right ventricular cavity is dilated. Left Atrium: Left  atrial size is normal in size with no left atrial appendage filling defect. Left Ventricle: The ventricular cavity size is severely dilated however, the entire left ventricle was not included in the FOV. Pulmonary arteries: Dilated pulmonary artery suggestive of pulmonary hypertension. Pulmonary veins: Normal pulmonary venous drainage. Pericardium: Normal thickness with no significant effusion or calcium present. Mitral Valve: The mitral valve is normal structure without significant calcification. Extra-cardiac findings: See attached radiology report for non-cardiac structures. IMPRESSION: 1. 25 mm [name] Intuity bioprosthetic valve with 2 identified paravalvular leaks under the RCC and LCC as detailed above. 2. 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 3. S/p CABG.  LIMA-LAD and RIMA-RCA are seen. 4. Severely dilated left ventricular cavity. 5. Dilated pulmonary artery suggestive of pulmonary hypertension. [place] T. Audie Box, MD Electronically Signed   By: Eleonore Chiquito M.D.   On: 09/30/2020 20:00   Result Date: 09/30/2020 EXAM: OVER-READ INTERPRETATION  CT CHEST The following report is an over-read performed by radiologist Dr. Vinnie Langton of [facility] [place] on 09/30/2020. This over-read does not include interpretation of cardiac or coronary anatomy or pathology. The coronary calcium score/coronary CTA interpretation by the cardiologist is attached. COMPARISON:  Chest CT 03/05/2019. FINDINGS: Extracardiac findings will be described separately under dictation for contemporaneously obtained CTA chest, abdomen and pelvis. IMPRESSION: Please see separate dictation for contemporaneously obtained CTA chest, abdomen and pelvis dated 09/30/2020 for full description of relevant extracardiac findings. Electronically Signed: By: Vinnie Langton M.D. On: 09/30/2020 13:45   CT ANGIO CHEST AORTA W/CM & OR WO/CM  Result Date: 09/30/2020 CLINICAL DATA:  70 year old male with history of severe aortic  stenosis. Preprocedural study prior to potential transcatheter aortic valve replacement (TAVR) procedure. EXAM: CT ANGIOGRAPHY CHEST, ABDOMEN AND PELVIS TECHNIQUE: Multidetector CT imaging through the chest, abdomen and pelvis was performed using the standard protocol during bolus administration of intravenous contrast. Multiplanar reconstructed images and MIPs were obtained and reviewed to evaluate the vascular anatomy. CONTRAST:  1101mL OMNIPAQUE IOHEXOL 350 MG/ML SOLN COMPARISON:  Chest CT 03/05/2019. FINDINGS: CTA CHEST FINDINGS Cardiovascular: Heart size is mildly enlarged. There is no significant pericardial fluid, thickening  or pericardial calcification. There is aortic atherosclerosis, as well as atherosclerosis of the great vessels of the mediastinum and the coronary arteries, including calcified atherosclerotic plaque in the left main, left anterior descending, left circumflex and right coronary arteries. Status post median sternotomy for CABG including LIMA to the LAD. Patient is also status post aortic valve replacement with what appears to be a stented bioprosthesis. 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 Mild aneurysmal dilatation of the ascending thoracic aorta (4.5 cm in diameter). Mediastinum/Lymph Nodes: No pathologically enlarged mediastinal or hilar lymph nodes. Esophagus is unremarkable in appearance. No axillary lymphadenopathy. Lungs/Pleura: No suspicious appearing pulmonary nodules or masses are noted. No acute consolidative airspace disease. Small bilateral pleural effusions lying dependently. Musculoskeletal/Soft Tissues: There are no aggressive appearing lytic or blastic lesions noted in the visualized portions of the skeleton. CTA ABDOMEN AND PELVIS FINDINGS Hepatobiliary: No suspicious cystic or solid hepatic lesions. No intra or extrahepatic biliary ductal dilatation. Tiny calcified gallstone lying dependently in the gallbladder. No findings to suggest an  acute cholecystitis at this time. Pancreas: No pancreatic mass. No pancreatic ductal dilatation. No pancreatic or peripancreatic fluid collections or inflammatory changes. Spleen: Unremarkable. Adrenals/Urinary Tract: 1.4 cm low-attenuation lesion in the interpolar region of the left kidney is compatible with a simple cyst. Mild bilateral renal atrophy. Bilateral perinephric stranding (nonspecific). Right kidney and bilateral adrenal glands are otherwise unremarkable in appearance. No hydroureteronephrosis. Urinary bladder is normal in appearance. Stomach/Bowel: The appearance of the stomach is normal. No pathologic dilatation of small bowel or colon. Numerous colonic diverticulae are noted, without definite surrounding inflammatory changes to clearly indicate an acute diverticulitis at this time. Normal appendix. Vascular/Lymphatic: Aortic atherosclerosis, without evidence of aneurysm or dissection in the abdominal or pelvic vasculature. No lymphadenopathy noted in the abdomen or pelvis. Reproductive: Prostate gland and seminal vesicles are unremarkable in appearance. Other: No significant volume of ascites.  No pneumoperitoneum. Musculoskeletal: There are no aggressive appearing lytic or blastic lesions noted in the visualized portions of the skeleton. VASCULAR MEASUREMENTS PERTINENT TO TAVR: AORTA: Minimal Aortic Diameter-22 x 19 mm Severity of Aortic Calcification-moderate RIGHT PELVIS: Right Common Iliac Artery - Minimal Diameter-11.8 x 13.3 mm Tortuosity-mild Calcification-moderate to severe Right External Iliac Artery - Minimal Diameter-10.9 x 11.3 mm Tortuosity-severe Calcification-none Right Common Femoral Artery - Minimal Diameter-11.9 x 10.9 mm Tortuosity-mild Calcification-mild LEFT PELVIS: Left Common Iliac Artery - Minimal Diameter-14.8 x 13.9 mm Tortuosity-mild Calcification-moderate to severe Left External Iliac Artery - Minimal Diameter-11.2 x 12.0 mm Tortuosity-moderate Calcification-none Left  Common Femoral Artery - Minimal Diameter-10.7 x 10.9 mm Tortuosity-mild Calcification-mild Review of the MIP images confirms the above findings. IMPRESSION: 1. Vascular findings and measurements pertinent to potential TAVR procedure, as detailed above. 2. Status post aortic valve replacement with what appears to be a stented bioprosthesis. 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 Ascending thoracic aortic aneurysm. Recommend semi-annual imaging followup by CTA or MRA and referral to cardiothoracic surgery if not already obtained. This recommendation follows 2010 ACCF/AHA/AATS/ACR/ASA/SCA/SCAI/SIR/STS/SVM Guidelines for the Diagnosis and Management of Patients With Thoracic Aortic Disease. Circulation. 2010; 121: G992-E268. Aortic aneurysm NOS (ICD10-I71.9). 3. Aortic atherosclerosis, in addition to left main and 3 vessel coronary artery disease. Status post median sternotomy for CABG including LIMA to the LAD. 4. Mild cardiomegaly. 5. Small bilateral pleural effusions lying dependently. 6. Colonic diverticulosis without evidence of acute diverticulitis at this time. 7. Additional incidental findings, as  above. Electronically Signed   By: Vinnie Langton M.D.   On: 09/30/2020 14:11   ECHOCARDIOGRAM COMPLETE  Result Date: 09/29/2020    ECHOCARDIOGRAM REPORT   Patient Name:   Gregory [name] Date of Exam: 09/29/2020 Medical Rec #:  [id number]         Height:       66.0 in Accession #:    [id number]        Weight:       226.0 lb Date of Birth:  11/25/1950         BSA:          2.106 m Patient Age:    70 years          BP:           160/59 mmHg Patient Gender: M                 HR:           41 bpm. Exam Location:  Inpatient Procedure: 2D Echo, Cardiac Doppler and Color Doppler Indications:    Dyspnea R06.00  History:        Patient has prior history of Echocardiogram examinations, most                 recent 12/07/2019. CAD, Aortic  Valve Disease, Arrythmias:LBBB and                 Complete Heart Block, Signs/Symptoms:Shortness of Breath; Risk                 Factors:Hypertension, Dyslipidemia and Sleep Apnea.                 Aortic Valve: 25 mm INTUITY valve is present in the aortic                 position. Procedure Date: 03/06/2019.  Sonographer:    Darlina Sicilian RDCS Referring Phys: [id number] [place]  1. The aortic valve has been Intuity 25 mm valve in the aortic position. There are two peri-valvular jets that are moderate in severity; in summation suggestive of severe regurgitation. There is a 25 mm INTUITY valve present in the aortic position. Procedure Date: 03/06/2019.  2. Left ventricular ejection fraction, by estimation, is 40 to 45%. The left ventricle has mildly decreased function. The left ventricle demonstrates global hypokinesis. The left ventricular internal cavity size was moderately dilated. There is mild concentric left ventricular hypertrophy. Left ventricular diastolic parameters are indeterminate.  3. Right ventricular systolic function is normal. The right ventricular size is normal. There is normal pulmonary artery systolic pressure.  4. Left atrial size was severely dilated.  5. The mitral valve is normal in structure. No evidence of mitral valve regurgitation.  6. Aortic dilatation noted. There is mild dilatation of the aortic root, measuring 44 mm. There is moderate dilatation of the ascending aorta, measuring 45 mm. Comparison(s): A prior study was performed on 12/07/2019. LV function is slightly worse, LV dilation has dilated. Comparison of the apical 5 chamber view and PSAX view (which does not show both jets) shows worse regurgitation despite being done at a lower  Nyquist limit. In comparison to post operative TEE, perivalvular leak which was present psot op has worsened. Conclusion(s)/Recommendation(s): TEE or cardiac MRI could be considered for further quantification of valve dysfunction.  FINDINGS  Left Ventricle: Left ventricular ejection fraction, by estimation, is 40 to 45%. The left ventricle has mildly decreased function. The left ventricle  demonstrates global hypokinesis. The left ventricular internal cavity size was moderately dilated. There is mild concentric left ventricular hypertrophy. Abnormal (paradoxical) septal motion, consistent with left bundle branch block. Left ventricular diastolic parameters are indeterminate. Right Ventricle: The right ventricular size is normal. Right vetricular wall thickness was not well visualized. Right ventricular systolic function is normal. There is normal pulmonary artery systolic pressure. The tricuspid regurgitant velocity is 2.62 m/s, and with an assumed right atrial pressure of 3 mmHg, the estimated right ventricular systolic pressure is 38.7 mmHg. Left Atrium: Left atrial size was severely dilated. Right Atrium: Right atrial size was normal in size. Pericardium: There is no evidence of pericardial effusion. Mitral Valve: The mitral valve is normal in structure. No evidence of mitral valve regurgitation. Tricuspid Valve: The tricuspid valve is normal in structure. Tricuspid valve regurgitation is trivial. Aortic Valve: The aortic valve has been repaired/replaced. Aortic valve regurgitation is severe. Aortic regurgitation PHT measures 691 msec. Aortic valve mean gradient measures 19.0 mmHg. Aortic valve peak gradient measures 42.0 mmHg. Aortic valve area, by VTI measures 1.70 cm. There is a 25 mm INTUITY valve present in the aortic position. Procedure Date: 03/06/2019. Pulmonic Valve: The pulmonic valve was not well visualized. Pulmonic valve regurgitation is trivial. No evidence of pulmonic stenosis. Aorta: Aortic dilatation noted. There is mild dilatation of the aortic root, measuring 44 mm. There is moderate dilatation of the ascending aorta, measuring 45 mm. IAS/Shunts: The atrial septum is grossly normal.  LEFT VENTRICLE PLAX 2D LVIDd:          6.70 cm      Diastology LVIDs:         5.30 cm      LV e' medial:    6.57 cm/s LV PW:         1.30 cm      LV E/e' medial:  17.5 LV IVS:        1.50 cm      LV e' lateral:   9.02 cm/s LVOT diam:     2.60 cm      LV E/e' lateral: 12.8 LV SV:         98 LV SV Index:   47 LVOT Area:     5.31 cm  LV Volumes (MOD) LV vol d, MOD A2C: 390.0 ml LV vol d, MOD A4C: 355.0 ml LV vol s, MOD A2C: 181.0 ml LV vol s, MOD A4C: 201.0 ml LV SV MOD A2C:     209.0 ml LV SV MOD A4C:     355.0 ml LV SV MOD BP:      180.5 ml RIGHT VENTRICLE RV S prime:     8.66 cm/s TAPSE (M-mode): 1.5 cm LEFT ATRIUM              Index       RIGHT ATRIUM           Index LA diam:        5.40 cm  2.56 cm/m  RA Area:     15.10 cm LA Vol (A2C):   128.0 ml 60.77 ml/m RA Volume:   32.90 ml  15.62 ml/m LA Vol (A4C):   139.0 ml 65.99 ml/m LA Biplane Vol: 136.0 ml 64.57 ml/m  AORTIC VALVE AV Area (Vmax):    1.35 cm AV Area (Vmean):   1.57 cm AV Area (VTI):     1.70 cm AV Vmax:           324.00 cm/s AV Vmean:  183.200 cm/s AV VTI:            0.580 m AV Peak Grad:      42.0 mmHg AV Mean Grad:      19.0 mmHg LVOT Vmax:         82.50 cm/s LVOT Vmean:        54.250 cm/s LVOT VTI:          0.186 m LVOT/AV VTI ratio: 0.32 AI PHT:            691 msec  AORTA Ao Root diam: 4.40 cm Ao Asc diam:  4.55 cm MITRAL VALVE                TRICUSPID VALVE MV Area (PHT): 3.92 cm     TR Peak grad:   27.5 mmHg MV Decel Time: 194 msec     TR Vmax:        262.00 cm/s MV E velocity: 115.08 cm/s                             SHUNTS                             Systemic VTI:  0.19 m                             Systemic Diam: 2.60 cm Rudean Haskell MD Electronically signed by Rudean Haskell MD Signature Date/Time: 09/29/2020/2:43:41 PM    Final    LONG TERM MONITOR (3-14 DAYS)  Result Date: 09/28/2020 Predominant rhythm sinus bradycardia High degree heart block Mobitz Type I and II with probable episodes of complete heart block Junctional rhythm  CT Angio  Abd/Pel w/ and/or w/o  Result Date: 09/30/2020 CLINICAL DATA:  70 year old male with history of severe aortic stenosis. Preprocedural study prior to potential transcatheter aortic valve replacement (TAVR) procedure. EXAM: CT ANGIOGRAPHY CHEST, ABDOMEN AND PELVIS TECHNIQUE: Multidetector CT imaging through the chest, abdomen and pelvis was performed using the standard protocol during bolus administration of intravenous contrast. Multiplanar reconstructed images and MIPs were obtained and reviewed to evaluate the vascular anatomy. CONTRAST:  143mL OMNIPAQUE IOHEXOL 350 MG/ML SOLN COMPARISON:  Chest CT 03/05/2019. FINDINGS: CTA CHEST FINDINGS Cardiovascular: Heart size is mildly enlarged. There is no significant pericardial fluid, thickening or pericardial calcification. There is aortic atherosclerosis, as well as atherosclerosis of the great vessels of the mediastinum and the coronary arteries, including calcified atherosclerotic plaque in the left main, left anterior descending, left circumflex and right coronary arteries. Status post median sternotomy for CABG including LIMA to the LAD. Patient is also status post aortic valve replacement with what appears to be a stented bioprosthesis. 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 Mild aneurysmal dilatation of the ascending thoracic aorta (4.5 cm in diameter). Mediastinum/Lymph Nodes: No pathologically enlarged mediastinal or hilar lymph nodes. Esophagus is unremarkable in appearance. No axillary lymphadenopathy. Lungs/Pleura: No suspicious appearing pulmonary nodules or masses are noted. No acute consolidative airspace disease. Small bilateral pleural effusions lying dependently. Musculoskeletal/Soft Tissues: There are no aggressive appearing lytic or blastic lesions noted in the visualized portions of the skeleton. CTA ABDOMEN AND PELVIS FINDINGS Hepatobiliary: No suspicious cystic or solid hepatic lesions. No intra or extrahepatic  biliary ductal dilatation. Tiny calcified gallstone lying dependently in the gallbladder. No findings to suggest an acute cholecystitis  at this time. Pancreas: No pancreatic mass. No pancreatic ductal dilatation. No pancreatic or peripancreatic fluid collections or inflammatory changes. Spleen: Unremarkable. Adrenals/Urinary Tract: 1.4 cm low-attenuation lesion in the interpolar region of the left kidney is compatible with a simple cyst. Mild bilateral renal atrophy. Bilateral perinephric stranding (nonspecific). Right kidney and bilateral adrenal glands are otherwise unremarkable in appearance. No hydroureteronephrosis. Urinary bladder is normal in appearance. Stomach/Bowel: The appearance of the stomach is normal. No pathologic dilatation of small bowel or colon. Numerous colonic diverticulae are noted, without definite surrounding inflammatory changes to clearly indicate an acute diverticulitis at this time. Normal appendix. Vascular/Lymphatic: Aortic atherosclerosis, without evidence of aneurysm or dissection in the abdominal or pelvic vasculature. No lymphadenopathy noted in the abdomen or pelvis. Reproductive: Prostate gland and seminal vesicles are unremarkable in appearance. Other: No significant volume of ascites.  No pneumoperitoneum. Musculoskeletal: There are no aggressive appearing lytic or blastic lesions noted in the visualized portions of the skeleton. VASCULAR MEASUREMENTS PERTINENT TO TAVR: AORTA: Minimal Aortic Diameter-22 x 19 mm Severity of Aortic Calcification-moderate RIGHT PELVIS: Right Common Iliac Artery - Minimal Diameter-11.8 x 13.3 mm Tortuosity-mild Calcification-moderate to severe Right External Iliac Artery - Minimal Diameter-10.9 x 11.3 mm Tortuosity-severe Calcification-none Right Common Femoral Artery - Minimal Diameter-11.9 x 10.9 mm Tortuosity-mild Calcification-mild LEFT PELVIS: Left Common Iliac Artery - Minimal Diameter-14.8 x 13.9 mm Tortuosity-mild Calcification-moderate to  severe Left External Iliac Artery - Minimal Diameter-11.2 x 12.0 mm Tortuosity-moderate Calcification-none Left Common Femoral Artery - Minimal Diameter-10.7 x 10.9 mm Tortuosity-mild Calcification-mild Review of the MIP images confirms the above findings. IMPRESSION: 1. Vascular findings and measurements pertinent to potential TAVR procedure, as detailed above. 2. Status post aortic valve replacement with what appears to be a stented bioprosthesis. 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 Ascending thoracic aortic aneurysm. Recommend semi-annual imaging followup by CTA or MRA and referral to cardiothoracic surgery if not already obtained. This recommendation follows 2010 ACCF/AHA/AATS/ACR/ASA/SCA/SCAI/SIR/STS/SVM Guidelines for the Diagnosis and Management of Patients With Thoracic Aortic Disease. Circulation. 2010; 121: S496-P591. Aortic aneurysm NOS (ICD10-I71.9). 3. Aortic atherosclerosis, in addition to left main and 3 vessel coronary artery disease. Status post median sternotomy for CABG including LIMA to the LAD. 4. Mild cardiomegaly. 5. Small bilateral pleural effusions lying dependently. 6. Colonic diverticulosis without evidence of acute diverticulitis at this time. 7. Additional incidental findings, as above. Electronically Signed   By: Vinnie Langton M.D.   On: 09/30/2020 14:11   Disposition   Pt is being discharged home today in good condition.  Follow-up Plans & Appointments     Follow-up Information     Vickie Epley, MD Follow up.   Specialties: Cardiology, Radiology Why: 11/19/20 @ 4:00PM Contact information: [place] [address] [phone]         [facility] [employer] Follow up.   Why: [employer] will provide your home oxygen. Contact information: [address] [place] [address] [phone]         [facility] Follow up on 10/05/2020.   Specialty: Cardiology Why: obtain BMET lab work at Dr. Rosezella [place] office, no fasting needed Contact information: [place] [phone]        [name], MD Follow up on 10/06/2020.   Specialty: Cardiology Why: 9:30AM. Cardiology follow up. 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   allopurinol 100 MG tablet Commonly known as: ZYLOPRIM Take 1 tablet (100 mg total) by mouth daily.   ALPRAZolam 0.5 MG tablet Commonly known as: XANAX TAKE 1 TABLET BY MOUTH ONCE DAILY AS NEEDED FOR ANXIETY TAKE  30  MINUTES  PRIOR  TO  PLANE  FLIGHT  OR  AS  NEEDED  FOR  ANXIETY What changed: See the new instructions.   aspirin EC 81 MG tablet Take 1 tablet (81 mg total) by mouth daily. Swallow whole.   atorvastatin 80 MG tablet Commonly known as: LIPITOR Take 1 tablet (80 mg total) by mouth daily.   colchicine 0.6 MG tablet Take 2 tablets by mouth once daily   furosemide 20 MG tablet Commonly known as: LASIX Take 2 tablets in the morning and 1 tablet in the afternoon What changed:  how much to take how to take this when to take this additional instructions    ketoconazole 2 % shampoo Commonly known as: NIZORAL Apply 1 application topically 2 (two) times a week.   latanoprost 0.005 % ophthalmic solution Commonly known as: XALATAN Place 1 drop into both eyes every morning.   Osteo Bi-Flex/5-Loxin Advanced Tabs Take 2 tablets by mouth daily.   potassium chloride SA 20 MEQ tablet Commonly known as: KLOR-CON Take 1 tablet (20 mEq total) by mouth daily. Start taking on: October 03, 2020   sacubitril-valsartan 49-51 MG Commonly known as: ENTRESTO Take 1 tablet by mouth 2 (two) times daily.   sertraline 50 MG tablet Commonly known as: ZOLOFT Take 1 tablet (50 mg total) by mouth daily.   spironolactone 25 MG tablet Commonly known as: ALDACTONE Take 1 tablet (25 mg total) by mouth daily. Start taking on: October 03, 2020   traZODone 50 MG tablet Commonly known as: DESYREL Take 0.5-1 tablets (25-50 mg total) by mouth at bedtime as needed for sleep. What changed:  how much to take when to take this               Durable Medical Equipment  (From admission, onward)           Start     Ordered   10/02/20 1036  For home use only DME oxygen  Once       Question Answer Comment  Length of Need 12 Months   Mode or (Route) Nasal cannula   Liters per Minute 2   Frequency Continuous (stationary and portable oxygen unit needed)   Oxygen delivery system Gas      10/02/20 1036  Outstanding Labs/Studies   BMET on 10/5  Duration of Discharge Encounter   Greater than 30 minutes including physician time.  Hilbert Corrigan, PA 10/02/2020, 11:44 AM   Patient seen and examined.  Plan as discussed in my rounding note for today and outlined above. Jeneen Rinks [facility]  10/02/2020  11:56 AM

## 2020-10-02 NOTE — Progress Notes (Signed)
Patient O2 trial  Resting on room air - 95%  Ambulating on room air - 86%  Ambulating on 2L [place] - 91%

## 2020-10-02 NOTE — Progress Notes (Signed)
Progress Note  Patient Name: Gregory [name] Date of Encounter: 10/02/2020  Primary Cardiologist:   [name], MD   Subjective   Feels much better.  Not acutely SOB.    Inpatient Medications    Scheduled Meds:  allopurinol  100 mg Oral Daily   aspirin EC  81 mg Oral Daily   atorvastatin  80 mg Oral Daily   furosemide  40 mg Intravenous BID   potassium chloride  40 mEq Oral Daily   sacubitril-valsartan  1 tablet Oral BID   sertraline  50 mg Oral Daily   sodium chloride flush  3 mL Intravenous Q12H   spironolactone  25 mg Oral Daily   Continuous Infusions:  sodium chloride     ferric gluconate (FERRLECIT) IVPB 250 mg (10/01/20 0823)   PRN Meds: sodium chloride, acetaminophen, ALPRAZolam, ondansetron (ZOFRAN) IV, sodium chloride flush, traMADol, traZODone   Vital Signs    Vitals:   10/01/20 1604 10/01/20 2005 10/01/20 2345 10/02/20 0331  BP: (!) 127/59 126/75 127/68 (!) 114/47  Pulse:  (!) 52 67 (!) 40  Resp: 17 18 18 14   Temp: 98.6 F (37 C) 98.1 F (36.7 C) 98.3 F (36.8 C) 98.5 F (36.9 C)  TempSrc: Oral Oral Oral Oral  SpO2: 97% 95% 92% 90%  Weight:      Height:        Intake/Output Summary (Last 24 hours) at 10/02/2020 0745 Last data filed at 10/01/2020 2347 Gross per 24 hour  Intake 640 ml  Output 2325 ml  Net -1685 ml   Filed Weights   09/28/20 1515 09/29/20 0410  Weight: 106.4 kg 102.5 kg    Telemetry    SB, HB [place] 1 and 2:1 HB and runs of CHB - Personally Reviewed  ECG    NA - Personally Reviewed  Physical Exam   GEN: No  acute distress.   Neck: No  JVD Cardiac: Irregular RR, harsh systolic murmur and diastolic murmur, rubs, or gallops.  Respiratory: Clear   to auscultation bilaterally. GI: Soft, nontender, non-distended, normal bowel sounds  MS:  Trace edema; No deformity. Neuro:   Nonfocal  Psych: Oriented and appropriate    Labs    Chemistry Recent Labs  Lab 09/28/20 1638 09/29/20 0141 09/30/20 0142  10/01/20 0121 10/02/20 0157  NA 139   < > 143 141 141  K 3.0*   < > 3.5 2.9* 3.9  CL 104   < > 104 102 104  CO2 28   < > 29 30 28   GLUCOSE 102*   < > 110* 90 104*  BUN 10   < > 9 8 11   CREATININE 0.75   < > 0.77 0.80 0.81  CALCIUM 8.3*   < > 8.6* 8.3* 8.6*  PROT 5.3*  --   --   --   --   ALBUMIN 3.3*  --   --   --   --   AST 43*  --   --   --   --   ALT 24  --   --   --   --   ALKPHOS 41  --   --   --   --   BILITOT 1.9*  --   --   --   --   GFRNONAA >60   < > >60 >60 >60  ANIONGAP 7   < > 10 9 9    < > = values in this interval not displayed.  Hematology Recent Labs  Lab 09/28/20 1638 09/30/20 0819  WBC 5.9 5.4  RBC 3.08* 3.84*  HGB 8.2* 10.2*  HCT 28.7* 35.5*  MCV 93.2 92.4  MCH 26.6 26.6  MCHC 28.6* 28.7*  RDW 17.3* 17.3*  PLT 223 216    Cardiac EnzymesNo results for input(s): TROPONINI in the last 168 hours. No results for input(s): TROPIPOC in the last 168 hours.   BNP Recent Labs  Lab 09/28/20 1638  BNP 2,498.6*     DDimer No results for input(s): DDIMER in the last 168 hours.   Radiology    CT CORONARY MORPH W/CTA COR W/SCORE W/CA W/CM &/OR WO/CM  Addendum Date: 09/30/2020   ADDENDUM REPORT: 09/30/2020 20:00 CLINICAL DATA:  Prosthetic valve regurgitation. 25 mm [name] Intuity bioprosthetic valve with severe regurgitation. EXAM: Cardiac TAVR CT TECHNIQUE: A non-contrast, gated CT scan was obtained with axial slices of 3 mm through the heart for aortic valve calcium scoring. A 120 kV retrospective, gated, contrast cardiac scan was obtained. Gantry rotation speed was 250 msecs and collimation was 0.6 mm. Nitroglycerin was not given. The 3D data set was reconstructed in 5% intervals of the 0-95% of the R-R cycle. Systolic and diastolic phases were analyzed on a dedicated workstation using MPR, MIP, and VRT modes. The patient received 100 cc of contrast. FINDINGS: Image quality: Excellent. Noise artifact is: Limited. Aortic Prosthesis: A 25 mm [name] Intuity  bioprosthetic valve is present in the aortic position. The leaflets of the prosthesis are freely mobile without evidence of leaflet thickening or thrombosis. There are 2 areas of paravalvular leak under the RCC and LCC related. The leak under the RCC measures ~3.7 mm x ~8.9 mm. The leak under the [place] is ~3.8 mm x 9.1 mm. The leak under then [facility] is likely related to heavy annular calcifications under the [place]. 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 Sinotubular Junction: 41 mm (double oblique). Ascending Thoracic Aorta: 41 mm (double oblique) Coronary Arteries: Normal coronary origin. Right dominance. The study was performed without use of NTG and is insufficient for plaque evaluation. A LIMA to the LAD is seen. A RIMA to the RCA is seen. 3-vessel coronary calcifications are seen in the native coronaries. Right Atrium: Right atrial size is dilated. Right Ventricle: The right ventricular cavity is dilated. Left Atrium: Left atrial size is normal in size with no left atrial appendage filling defect. Left Ventricle: The ventricular cavity size is severely dilated however, the entire left ventricle was not included in the FOV. Pulmonary arteries: Dilated pulmonary artery suggestive of pulmonary hypertension. Pulmonary veins: Normal pulmonary venous drainage. Pericardium: Normal thickness with no significant effusion or calcium present. Mitral Valve: The mitral valve is normal structure without significant calcification. Extra-cardiac findings: See attached radiology report for non-cardiac structures. IMPRESSION: 1. 25 mm [name] Intuity bioprosthetic valve with 2 identified paravalvular leaks under the RCC and LCC as detailed above. 2. 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 3. S/p CABG.  LIMA-LAD and RIMA-RCA are seen. 4. Severely dilated left ventricular cavity. 5. Dilated pulmonary artery suggestive of pulmonary hypertension. [place] T. Audie Box, MD Electronically Signed   By: Eleonore Chiquito M.D.    On: 09/30/2020 20:00   Result Date: 09/30/2020 EXAM: OVER-READ INTERPRETATION  CT CHEST The following report is an over-read performed by radiologist Dr. Vinnie Langton of [facility] [place] on 09/30/2020. This over-read does not include interpretation of cardiac or coronary anatomy or pathology. The coronary calcium score/coronary CTA interpretation by  the cardiologist is attached. COMPARISON:  Chest CT 03/05/2019. FINDINGS: Extracardiac findings will be described separately under dictation for contemporaneously obtained CTA chest, abdomen and pelvis. IMPRESSION: Please see separate dictation for contemporaneously obtained CTA chest, abdomen and pelvis dated 09/30/2020 for full description of relevant extracardiac findings. Electronically Signed: By: Vinnie Langton M.D. On: 09/30/2020 13:45   CT ANGIO CHEST AORTA W/CM & OR WO/CM  Result Date: 09/30/2020 CLINICAL DATA:  70 year old male with history of severe aortic stenosis. Preprocedural study prior to potential transcatheter aortic valve replacement (TAVR) procedure. EXAM: CT ANGIOGRAPHY CHEST, ABDOMEN AND PELVIS TECHNIQUE: Multidetector CT imaging through the chest, abdomen and pelvis was performed using the standard protocol during bolus administration of intravenous contrast. Multiplanar reconstructed images and MIPs were obtained and reviewed to evaluate the vascular anatomy. CONTRAST:  136mL OMNIPAQUE IOHEXOL 350 MG/ML SOLN COMPARISON:  Chest CT 03/05/2019. FINDINGS: CTA CHEST FINDINGS Cardiovascular: Heart size is mildly enlarged. There is no significant pericardial fluid, thickening or pericardial calcification. There is aortic atherosclerosis, as well as atherosclerosis of the great vessels of the mediastinum and the coronary arteries, including calcified atherosclerotic plaque in the left main, left anterior descending, left circumflex and right coronary arteries. Status post median sternotomy for CABG including LIMA to the LAD. Patient is  also status post aortic valve replacement with what appears to be a stented bioprosthesis. 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 Mild aneurysmal dilatation of the ascending thoracic aorta (4.5 cm in diameter). Mediastinum/Lymph Nodes: No pathologically enlarged mediastinal or hilar lymph nodes. Esophagus is unremarkable in appearance. No axillary lymphadenopathy. Lungs/Pleura: No suspicious appearing pulmonary nodules or masses are noted. No acute consolidative airspace disease. Small bilateral pleural effusions lying dependently. Musculoskeletal/Soft Tissues: There are no aggressive appearing lytic or blastic lesions noted in the visualized portions of the skeleton. CTA ABDOMEN AND PELVIS FINDINGS Hepatobiliary: No suspicious cystic or solid hepatic lesions. No intra or extrahepatic biliary ductal dilatation. Tiny calcified gallstone lying dependently in the gallbladder. No findings to suggest an acute cholecystitis at this time. Pancreas: No pancreatic mass. No pancreatic ductal dilatation. No pancreatic or peripancreatic fluid collections or inflammatory changes. Spleen: Unremarkable. Adrenals/Urinary Tract: 1.4 cm low-attenuation lesion in the interpolar region of the left kidney is compatible with a simple cyst. Mild bilateral renal atrophy. Bilateral perinephric stranding (nonspecific). Right kidney and bilateral adrenal glands are otherwise unremarkable in appearance. No hydroureteronephrosis. Urinary bladder is normal in appearance. Stomach/Bowel: The appearance of the stomach is normal. No pathologic dilatation of small bowel or colon. Numerous colonic diverticulae are noted, without definite surrounding inflammatory changes to clearly indicate an acute diverticulitis at this time. Normal appendix. Vascular/Lymphatic: Aortic atherosclerosis, without evidence of aneurysm or dissection in the abdominal or pelvic vasculature. No lymphadenopathy noted in the abdomen or pelvis.  Reproductive: Prostate gland and seminal vesicles are unremarkable in appearance. Other: No significant volume of ascites.  No pneumoperitoneum. Musculoskeletal: There are no aggressive appearing lytic or blastic lesions noted in the visualized portions of the skeleton. VASCULAR MEASUREMENTS PERTINENT TO TAVR: AORTA: Minimal Aortic Diameter-22 x 19 mm Severity of Aortic Calcification-moderate RIGHT PELVIS: Right Common Iliac Artery - Minimal Diameter-11.8 x 13.3 mm Tortuosity-mild Calcification-moderate to severe Right External Iliac Artery - Minimal Diameter-10.9 x 11.3 mm Tortuosity-severe Calcification-none Right Common Femoral Artery - Minimal Diameter-11.9 x 10.9 mm Tortuosity-mild Calcification-mild LEFT PELVIS: Left Common Iliac Artery - Minimal Diameter-14.8 x 13.9 mm Tortuosity-mild Calcification-moderate to severe Left External Iliac Artery - Minimal Diameter-11.2 x 12.0 mm Tortuosity-moderate  Calcification-none Left Common Femoral Artery - Minimal Diameter-10.7 x 10.9 mm Tortuosity-mild Calcification-mild Review of the MIP images confirms the above findings. IMPRESSION: 1. Vascular findings and measurements pertinent to potential TAVR procedure, as detailed above. 2. Status post aortic valve replacement with what appears to be a stented bioprosthesis. 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 Ascending thoracic aortic aneurysm. Recommend semi-annual imaging followup by CTA or MRA and referral to cardiothoracic surgery if not already obtained. This recommendation follows 2010 ACCF/AHA/AATS/ACR/ASA/SCA/SCAI/SIR/STS/SVM Guidelines for the Diagnosis and Management of Patients With Thoracic Aortic Disease. Circulation. 2010; 121: D326-Z124. Aortic aneurysm NOS (ICD10-I71.9). 3. Aortic atherosclerosis, in addition to left main and 3 vessel coronary artery disease. Status post median sternotomy for CABG including LIMA to the LAD.  4. Mild cardiomegaly. 5. Small bilateral pleural effusions lying dependently. 6. Colonic diverticulosis without evidence of acute diverticulitis at this time. 7. Additional incidental findings, as above. Electronically Signed   By: Vinnie Langton M.D.   On: 09/30/2020 14:11   CT Angio Abd/Pel w/ and/or w/o  Result Date: 09/30/2020 CLINICAL DATA:  70 year old male with history of severe aortic stenosis. Preprocedural study prior to potential transcatheter aortic valve replacement (TAVR) procedure. EXAM: CT ANGIOGRAPHY CHEST, ABDOMEN AND PELVIS TECHNIQUE: Multidetector CT imaging through the chest, abdomen and pelvis was performed using the standard protocol during bolus administration of intravenous contrast. Multiplanar reconstructed images and MIPs were obtained and reviewed to evaluate the vascular anatomy. CONTRAST:  139mL OMNIPAQUE IOHEXOL 350 MG/ML SOLN COMPARISON:  Chest CT 03/05/2019. FINDINGS: CTA CHEST FINDINGS Cardiovascular: Heart size is mildly enlarged. There is no significant pericardial fluid, thickening or pericardial calcification. There is aortic atherosclerosis, as well as atherosclerosis of the great vessels of the mediastinum and the coronary arteries, including calcified atherosclerotic plaque in the left main, left anterior descending, left circumflex and right coronary arteries. Status post median sternotomy for CABG including LIMA to the LAD. Patient is also status post aortic valve replacement with what appears to be a stented bioprosthesis. 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 Mild aneurysmal dilatation of the ascending thoracic aorta (4.5 cm in diameter). Mediastinum/Lymph Nodes: No pathologically enlarged mediastinal or hilar lymph nodes. Esophagus is unremarkable in appearance. No axillary lymphadenopathy. Lungs/Pleura: No suspicious appearing pulmonary nodules or masses are noted. No acute consolidative airspace disease. Small bilateral pleural  effusions lying dependently. Musculoskeletal/Soft Tissues: There are no aggressive appearing lytic or blastic lesions noted in the visualized portions of the skeleton. CTA ABDOMEN AND PELVIS FINDINGS Hepatobiliary: No suspicious cystic or solid hepatic lesions. No intra or extrahepatic biliary ductal dilatation. Tiny calcified gallstone lying dependently in the gallbladder. No findings to suggest an acute cholecystitis at this time. Pancreas: No pancreatic mass. No pancreatic ductal dilatation. No pancreatic or peripancreatic fluid collections or inflammatory changes. Spleen: Unremarkable. Adrenals/Urinary Tract: 1.4 cm low-attenuation lesion in the interpolar region of the left kidney is compatible with a simple cyst. Mild bilateral renal atrophy. Bilateral perinephric stranding (nonspecific). Right kidney and bilateral adrenal glands are otherwise unremarkable in appearance. No hydroureteronephrosis. Urinary bladder is normal in appearance. Stomach/Bowel: The appearance of the stomach is normal. No pathologic dilatation of small bowel or colon. Numerous colonic diverticulae are noted, without definite surrounding inflammatory changes to clearly indicate an acute diverticulitis at this time. Normal appendix. Vascular/Lymphatic: Aortic atherosclerosis, without evidence of aneurysm or dissection in the abdominal or pelvic vasculature. No lymphadenopathy noted in the abdomen  or pelvis. Reproductive: Prostate gland and seminal vesicles are unremarkable in appearance. Other: No significant volume of ascites.  No pneumoperitoneum. Musculoskeletal: There are no aggressive appearing lytic or blastic lesions noted in the visualized portions of the skeleton. VASCULAR MEASUREMENTS PERTINENT TO TAVR: AORTA: Minimal Aortic Diameter-22 x 19 mm Severity of Aortic Calcification-moderate RIGHT PELVIS: Right Common Iliac Artery - Minimal Diameter-11.8 x 13.3 mm Tortuosity-mild Calcification-moderate to severe Right External Iliac  Artery - Minimal Diameter-10.9 x 11.3 mm Tortuosity-severe Calcification-none Right Common Femoral Artery - Minimal Diameter-11.9 x 10.9 mm Tortuosity-mild Calcification-mild LEFT PELVIS: Left Common Iliac Artery - Minimal Diameter-14.8 x 13.9 mm Tortuosity-mild Calcification-moderate to severe Left External Iliac Artery - Minimal Diameter-11.2 x 12.0 mm Tortuosity-moderate Calcification-none Left Common Femoral Artery - Minimal Diameter-10.7 x 10.9 mm Tortuosity-mild Calcification-mild Review of the MIP images confirms the above findings. IMPRESSION: 1. Vascular findings and measurements pertinent to potential TAVR procedure, as detailed above. 2. Status post aortic valve replacement with what appears to be a stented bioprosthesis. 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 Ascending thoracic aortic aneurysm. Recommend semi-annual imaging followup by CTA or MRA and referral to cardiothoracic surgery if not already obtained. This recommendation follows 2010 ACCF/AHA/AATS/ACR/ASA/SCA/SCAI/SIR/STS/SVM Guidelines for the Diagnosis and Management of Patients With Thoracic Aortic Disease. Circulation. 2010; 121: M094-B096. Aortic aneurysm NOS (ICD10-I71.9). 3. Aortic atherosclerosis, in addition to left main and 3 vessel coronary artery disease. Status post median sternotomy for CABG including LIMA to the LAD. 4. Mild cardiomegaly. 5. Small bilateral pleural effusions lying dependently. 6. Colonic diverticulosis without evidence of acute diverticulitis at this time. 7. Additional incidental findings, as above. Electronically Signed   By: Vinnie Langton M.D.   On: 09/30/2020 14:11    Cardiac Studies   ECHO:    IMPRESSIONS The aortic valve has been Intuity 25 mm valve in the aortic position. There are two peri-valvular jets that are moderate in severity; in summation suggestive of severe regurgitation. There is a 25 mm INTUITY  valve present in the aortic position. Procedure Date: 03/06/2019. 1. Left ventricular ejection fraction, by estimation, is 40 to 45%. The left ventricle has mildly decreased function. The left ventricle demonstrates global hypokinesis. The left ventricular internal cavity size was moderately dilated. There is mild concentric left ventricular hypertrophy. Left ventricular diastolic parameters are indeterminate. 2. Right ventricular systolic function is normal. The right ventricular size is normal. There is normal pulmonary artery systolic pressure. 3. 4. Left atrial size was severely dilated. 5. The mitral valve is normal in structure. No evidence of mitral valve regurgitation. Aortic dilatation noted. There is mild dilatation of the aortic root, measuring 44 mm. There is moderate dilatation of the ascending aorta, measuring 45 mm.   Patient Profile     70 y.o. male with CAD/CABG, AVR, mildly reduced EF who presented with symptomatic bradycardia and acute on chronic systolic and diastolic HF.  Also being treated for anemia.    Assessment & Plan    BRADYCARDIA:    See previous note.  He has follow up with me on 10/6 and Dr. Quentin Ore on 10/16.  Ok to discharge with instructions to return to the ED if he has any symptomatic bradycardia with syncope, presyncope.    HTN:   Started Entresto (increased yesterday) and spironolactone this admission.  Send home on 20 meq of potassium daily since he has required so much potassium.  I would like to also have him on  40 mg PO Lasix am and 20 six hours later.  Needs BMET mid next week with results to me.      ISCHEMIC CM:   EF listed at 45% but I think it appears lower than than and lower than previous.  (I would suggest that the EF is about 30%. )     ACUTE SYSTOLIC HF:    Net negative 11.9 liters since admission.   See above.    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 I brought up the issue of  perivalvular leak and hemolysis.  He has had three iron infusions.  Follow up with Dr. Lindi Adie.    HYPOKALEMIA:    Supplemented.     CAD:  No active ischemia.     AVR:  He has had perivalvular lead noted. Forward note to Structural Heart Team who will work on the timing of TEE and initial appt with Dr. Burt Knack.  He has follow up with  me already scheduled  HYPOXEMIA:  He has had resting O2 sats on RA less than 88% and improves to greater than 92 with 2l.  This is after diuresis.  Please arrange home O2.   HIT: He is to avoid all heparin products.  For questions or updates, please contact [place] Please consult [email or web address] for contact info under Cardiology/STEMI.   Signed, [name], MD  10/02/2020, 7:45 AM

## 2020-10-03 ENCOUNTER — Other Ambulatory Visit: Payer: Self-pay | Admitting: *Deleted

## 2020-10-03 ENCOUNTER — Telehealth: Payer: Self-pay | Admitting: Hematology and Oncology

## 2020-10-03 DIAGNOSIS — D509 Iron deficiency anemia, unspecified: Secondary | ICD-10-CM

## 2020-10-03 NOTE — Telephone Encounter (Signed)
Scheduled per 10/3 sch msg, pt has been called and confirmed appt. 

## 2020-10-04 ENCOUNTER — Telehealth: Payer: Self-pay | Admitting: Hematology and Oncology

## 2020-10-04 ENCOUNTER — Telehealth: Payer: Self-pay

## 2020-10-04 NOTE — Telephone Encounter (Signed)
Rescheduled labs per 10/3 sch msg, pt was called and confirmed appt

## 2020-10-04 NOTE — Telephone Encounter (Signed)
Spoke with the patient who asked if he needs to continue seeing Dr. Lindi Adie. Encouraged him to continue lab draws at this time and to call Dr. Geralyn Flash office for further clarification if he does not think he needs to go. He was grateful for call and agrees with plan.

## 2020-10-04 NOTE — Telephone Encounter (Signed)
Discussed the patient in Valve Team meeting this morning.  The patient has paravalvular AI with 2 separate jets after Intuity surgical valve.  Scheduled the patient for consult with Dr. Burt Knack 10/06/2020. Cancelled appointment with Dr. Percival Spanish 10/06/2020. Scheduled the patient for TEE and [facility] 10/10/2020. The patient was grateful for call and agrees with plan.

## 2020-10-05 ENCOUNTER — Ambulatory Visit: Payer: Medicare HMO

## 2020-10-06 ENCOUNTER — Ambulatory Visit: Payer: Medicare HMO | Admitting: Cardiology

## 2020-10-06 ENCOUNTER — Other Ambulatory Visit: Payer: Self-pay

## 2020-10-06 ENCOUNTER — Ambulatory Visit: Payer: Medicare HMO | Admitting: Cardiovascular Disease

## 2020-10-06 ENCOUNTER — Encounter: Payer: Self-pay | Admitting: Cardiovascular Disease

## 2020-10-06 VITALS — BP 100/60 | HR 76 | Ht 67.0 in | Wt 217.4 lb

## 2020-10-06 DIAGNOSIS — Z01818 Encounter for other preprocedural examination: Secondary | ICD-10-CM | POA: Diagnosis not present

## 2020-10-06 DIAGNOSIS — Z952 Presence of prosthetic heart valve: Secondary | ICD-10-CM | POA: Diagnosis not present

## 2020-10-06 DIAGNOSIS — I441 Atrioventricular block, second degree: Secondary | ICD-10-CM

## 2020-10-06 DIAGNOSIS — I351 Nonrheumatic aortic (valve) insufficiency: Secondary | ICD-10-CM | POA: Diagnosis not present

## 2020-10-06 DIAGNOSIS — Z01810 Encounter for preprocedural cardiovascular examination: Secondary | ICD-10-CM

## 2020-10-06 DIAGNOSIS — T8203XA Leakage of heart valve prosthesis, initial encounter: Secondary | ICD-10-CM | POA: Diagnosis not present

## 2020-10-06 NOTE — Patient Instructions (Signed)
Medication Instructions:  Your provider recommends that you continue on your current medications as directed. Please refer to the Current Medication list given to you today.   *If you need a refill on your cardiac medications before your next appointment, please call your pharmacy*  Lab Work: TODAY! BMET, CBC, haptoglobin If you have labs (blood work) drawn today and your tests are completely normal, you will receive your results only by: [place] (if you have MyChart) OR A paper copy in the mail If you have any lab test that is abnormal or we need to change your treatment, we will call you to review the results.  Testing/Procedures: Your physician has requested that you have a TEE. During a TEE, sound waves are used to create images of your heart. It provides your doctor with information about the size and shape of your heart and how well your heart's chambers and valves are working. In this test, a transducer is attached to the end of a flexible tube that's guided down your throat and into your esophagus (the tube leading from you mouth to your stomach) to get a more detailed image of your heart. You are not awake for the procedure. Please see the instruction sheet given to you today. For further information please visit [email or web address].   Your physician has requested that you have a cardiac catheterization. Cardiac catheterization is used to diagnose and/or treat various heart conditions. Doctors may recommend this procedure for a number of different reasons. The most common reason is to evaluate chest pain. Chest pain can be a symptom of coronary artery disease (CAD), and cardiac catheterization can show whether plaque is narrowing or blocking your heart's arteries. This procedure is also used to evaluate the valves, as well as measure the blood flow and oxygen levels in different parts of your heart. For further information please visit [email or web address]. Please follow instruction  sheet, as given.   Your tentative date for surgery is 10/18/2020 (for planning).  We will be in touch to schedule further follow up!

## 2020-10-06 NOTE — Progress Notes (Signed)
Cardiology Office Note:    Date:  10/06/2020   ID:  Gregory [name], DOB 04/06/1950, MRN [id number]  PCP:  Jinny Sanders, MD   [facility] Providers Cardiologist:  [name], MD Cardiology APP:  Lendon Colonel, NP     Referring MD: Jinny Sanders, MD   Chief Complaint  Patient presents with   Shortness of Breath    History of Present Illness:    Gregory [name] is a 70 y.o. male presenting for evaluation of severe paravalvular aortic regurgitation.  The patient is here with his wife today.  The patient has been followed for several years with aortic valve disease and felt to be asymptomatic.  He developed symptoms in 2021 and underwent cardiac catheterization for preoperative planning for treatment of severe aortic insufficiency and stenosis with mixed aortic valve disease.  He was found to have multivessel coronary artery disease and ultimately treated with multivessel CABG and bioprosthetic aortic valve replacement.  He underwent CABG/AVR 03/06/2019 with a 25 mm [name] Intuity rapid deployment valve and CABG x 3 with a left radial to PDA, LIMA-LAD, and pedicled RIMA - RPL.  The patient's postoperative course was complicated by heparin-induced thrombocytopenia.  He was ultimately treated with several months of warfarin and recovered uneventfully.  He otherwise did well with surgery and was getting along fairly well until recent months when he has developed progressive problems with anemia.  He is also been having some heart palpitations, progressive fatigue, and exertional dyspnea.  The patient was hospitalized September 28, 2020 as he was found to have 2-1 heart block on his outpatient monitor and also admitted to worsening symptoms of swelling and weight gain.  He was hospitalized with the intention of IV diuresis for treatment of congestive heart failure, follow-up echo imaging to assess his aortic valve, and inpatient electrophysiology consultation for consideration of  permanent pacemaker placement.  During his hospitalization, he was found to have severe paravalvular regurgitation in 2 different areas around the sewing ring of his bovine bioprosthesis.  He is here with his wife today. He denies lightheadedness, syncope, orthopnea, PND, or chest pain. He does complain of fatigue, exertional dyspnea, and leg swelling. However, his edema has improved with medical therapy. He has no other complaints today.   Past Medical History:  Diagnosis Date   Alcohol abuse    Anxiety    Aortic valve disease    Arthritis    Coronary artery disease    Glaucoma    Hypertension    OSA (obstructive sleep apnea)    Pre-diabetes    Shingles     Past Surgical History:  Procedure Laterality Date   AORTIC VALVE REPLACEMENT N/A 03/06/2019   Procedure: AORTIC VALVE REPLACEMENT (AVR), USING INTUITY 25MM;  Surgeon: Wonda Olds, MD;  Location: [place];  Service: Open Heart Surgery;  Laterality: N/A;   CARDIAC CATHETERIZATION     CATARACT EXTRACTION     CORONARY ARTERY BYPASS GRAFT N/A 03/06/2019   Procedure: CORONARY ARTERY BYPASS GRAFTING (CABG), ON PUMP, TIMES THREE, USING BILATERAL INTERNAL MAMMARIES AND LEFT RADIAL ARTERY HARVEST;  Surgeon: Wonda Olds, MD;  Location: [place];  Service: Open Heart Surgery;  Laterality: N/A;  BILATERAL IMA   LEFT HEART CATH AND CORONARY ANGIOGRAPHY N/A 03/04/2019   Procedure: LEFT HEART CATH AND CORONARY ANGIOGRAPHY;  Surgeon: Burnell Blanks, MD;  Location: [place] [facility];  Service: Cardiovascular;  Laterality: N/A;   None     RADIAL ARTERY HARVEST  Left 03/06/2019   Procedure: RADIAL ARTERY HARVEST;  Surgeon: Wonda Olds, MD;  Location: [place];  Service: Open Heart Surgery;  Laterality: Left;   TEE WITHOUT CARDIOVERSION N/A 03/06/2019   Procedure: TRANSESOPHAGEAL ECHOCARDIOGRAM (TEE);  Surgeon: Wonda Olds, MD;  Location: [place];  Service: Open Heart Surgery;  Laterality: N/A;    Current Medications: Current Meds   Medication Sig   acetaminophen (TYLENOL) 500 MG tablet Take 1,000 mg by mouth every 6 (six) hours as needed for moderate pain.   allopurinol (ZYLOPRIM) 100 MG tablet Take 1 tablet (100 mg total) by mouth daily.   ALPRAZolam (XANAX) 0.5 MG tablet TAKE 1 TABLET BY MOUTH ONCE DAILY AS NEEDED FOR ANXIETY TAKE  30  MINUTES  PRIOR  TO  PLANE  FLIGHT  OR  AS  NEEDED  FOR  ANXIETY   aspirin EC 81 MG tablet Take 1 tablet (81 mg total) by mouth daily. Swallow whole.   atorvastatin (LIPITOR) 80 MG tablet Take 1 tablet (80 mg total) by mouth daily.   colchicine 0.6 MG tablet Take 2 tablets by mouth once daily (Patient taking differently: Take 0.3 mg by mouth daily. Take 1 tablet (0.3 mg) by mouth scheduled every day, may take 1 additional tablet if needed for gout flare)   furosemide (LASIX) 20 MG tablet Take 2 tablets in the morning and 1 tablet in the afternoon   ketoconazole (NIZORAL) 2 % shampoo Apply 1 application topically 2 (two) times a week. (Patient taking differently: Apply 1 application topically once a week.)   latanoprost (XALATAN) 0.005 % ophthalmic solution Place 1 drop into both eyes every morning.    Misc Natural Products (OSTEO BI-FLEX/5-LOXIN ADVANCED) TABS Take 2 tablets by mouth daily.   potassium chloride SA (KLOR-CON) 20 MEQ tablet Take 1 tablet (20 mEq total) by mouth daily.   sacubitril-valsartan (ENTRESTO) 49-51 MG Take 1 tablet by mouth 2 (two) times daily.   sertraline (ZOLOFT) 50 MG tablet Take 1 tablet (50 mg total) by mouth daily.   spironolactone (ALDACTONE) 25 MG tablet Take 1 tablet (25 mg total) by mouth daily.   traZODone (DESYREL) 50 MG tablet Take 0.5-1 tablets (25-50 mg total) by mouth at bedtime as needed for sleep. (Patient taking differently: Take 50 mg by mouth at bedtime as needed for sleep.)     Allergies:   Heparin   Social History   Socioeconomic History   Marital status: Married    Spouse name: Cindi   Number of children: 3   Years of education: Not on  file   Highest education level: Not on file  Occupational History   Occupation: Architect  Tobacco Use   Smoking status: Never   Smokeless tobacco: Never  Vaping Use   Vaping Use: Never used  Substance and Sexual Activity   Alcohol use: Yes    Alcohol/week: 12.0 standard drinks    Types: 12 Cans of beer per week   Drug use: Yes    Frequency: 2.0 times per week    Types: Marijuana    Comment: gummies   Sexual activity: Not on file  Other Topics Concern   Not on file  Social History Narrative   Lives at home with wife.   Social Determinants of Health   Financial Resource Strain: Low Risk    Difficulty of Paying Living Expenses: Not hard at all  Food Insecurity: No Food Insecurity   Worried About Charity fundraiser in the Last Year: Never true  Ran Out of Food in the Last Year: Never true  Transportation Needs: No Transportation Needs   Lack of Transportation (Medical): No   Lack of Transportation (Non-Medical): No  Physical Activity: Inactive   Days of Exercise per Week: 0 days   Minutes of Exercise per Session: 0 min  Stress: Stress Concern Present   Feeling of Stress : To some extent  Social Connections: Not on file     Family History: The patient's family history includes Alcohol abuse in his father; Breast cancer in his sister; Cancer in his mother. He was adopted.  ROS:   Please see the history of present illness.    All other systems reviewed and are negative.  EKGs/Labs/Other Studies Reviewed:    The following studies were reviewed today: Cardiac CTA: Aortic Prosthesis: A 25 mm [name] Intuity bioprosthetic valve is present in the aortic position. The leaflets of the prosthesis are freely mobile without evidence of leaflet thickening or thrombosis. There are 2 areas of paravalvular leak under the RCC and LCC related. The leak under the RCC measures ~3.7 mm x ~8.9 mm. The leak under the [place] is ~3.8 mm x 9.1 mm. The leak under then [facility] is likely  related to heavy annular calcifications under the [place].   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   Sinotubular Junction: 41 mm (double oblique).   Ascending Thoracic Aorta: 41 mm (double oblique)   Coronary Arteries: Normal coronary origin. Right dominance. The study was performed without use of NTG and is insufficient for plaque evaluation. A LIMA to the LAD is seen. A RIMA to the RCA is seen. 3-vessel coronary calcifications are seen in the native coronaries.   Right Atrium: Right atrial size is dilated.   Right Ventricle: The right ventricular cavity is dilated.   Left Atrium: Left atrial size is normal in size with no left atrial appendage filling defect.   Left Ventricle: The ventricular cavity size is severely dilated however, the entire left ventricle was not included in the FOV.   Pulmonary arteries: Dilated pulmonary artery suggestive of pulmonary hypertension.   Pulmonary veins: Normal pulmonary venous drainage.   Pericardium: Normal thickness with no significant effusion or calcium present.   Mitral Valve: The mitral valve is normal structure without significant calcification.   Extra-cardiac findings: See attached radiology report for non-cardiac structures.   IMPRESSION: 1. 25 mm [name] Intuity bioprosthetic valve with 2 identified paravalvular leaks under the RCC and LCC as detailed above.   2. 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   3. S/p CABG.  LIMA-LAD and RIMA-RCA are seen.   4. Severely dilated left ventricular cavity.   5. Dilated pulmonary artery suggestive of pulmonary hypertension.  CTA Chest/Abd/Pelvis: AORTA:   Minimal Aortic Diameter-22 x 19 mm   Severity of Aortic Calcification-moderate   RIGHT PELVIS:   Right Common Iliac Artery -   Minimal Diameter-11.8 x 13.3 mm   Tortuosity-mild   Calcification-moderate to severe   Right External Iliac Artery -   Minimal Diameter-10.9 x 11.3 mm    Tortuosity-severe   Calcification-none   Right Common Femoral Artery -   Minimal Diameter-11.9 x 10.9 mm   Tortuosity-mild   Calcification-mild   LEFT PELVIS:   Left Common Iliac Artery -   Minimal Diameter-14.8 x 13.9 mm   Tortuosity-mild   Calcification-moderate to severe   Left External Iliac Artery -   Minimal Diameter-11.2 x 12.0 mm   Tortuosity-moderate   Calcification-none  Left Common Femoral Artery -   Minimal Diameter-10.7 x 10.9 mm   Tortuosity-mild   Calcification-mild   Review of the MIP images confirms the above findings.   IMPRESSION: 1. Vascular findings and measurements pertinent to potential TAVR procedure, as detailed above. 2. Status post aortic valve replacement with what appears to be a stented bioprosthesis. 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 Ascending thoracic aortic aneurysm. Recommend semi-annual imaging followup by CTA or MRA and referral to cardiothoracic surgery if not already obtained. This recommendation follows 2010 ACCF/AHA/AATS/ACR/ASA/SCA/SCAI/SIR/STS/SVM Guidelines for the Diagnosis and Management of Patients With Thoracic Aortic Disease. Circulation. 2010; 121: M629-U765. Aortic aneurysm NOS (ICD10-I71.9). 3. Aortic atherosclerosis, in addition to left main and 3 vessel coronary artery disease. Status post median sternotomy for CABG including LIMA to the LAD. 4. Mild cardiomegaly. 5. Small bilateral pleural effusions lying dependently. 6. Colonic diverticulosis without evidence of acute diverticulitis at this time. 7. Additional incidental findings, as above.  Echo 09/29/2020: 1. The aortic valve has been Intuity 25 mm valve in the aortic position.  There are two peri-valvular jets that are moderate in severity; in  summation suggestive of severe regurgitation. There is a 25 mm INTUITY  valve present in the aortic position.   Procedure Date: 03/06/2019.   2. Left ventricular ejection fraction, by estimation, is 40 to 45%. The  left ventricle has mildly decreased function. The left ventricle  demonstrates global hypokinesis. The left ventricular internal cavity size  was moderately dilated. There is mild  concentric left ventricular hypertrophy. Left ventricular diastolic  parameters are indeterminate.   3. Right ventricular systolic function is normal. The right ventricular  size is normal. There is normal pulmonary artery systolic pressure.   4. Left atrial size was severely dilated.   5. The mitral valve is normal in structure. No evidence of mitral valve  regurgitation.   6. Aortic dilatation noted. There is mild dilatation of the aortic root,  measuring 44 mm. There is moderate dilatation of the ascending aorta,  measuring 45 mm.   Comparison(s): A prior study was performed on 12/07/2019. LV function is  slightly worse, LV dilation has dilated. Comparison of the apical 5  chamber view and PSAX view (which does not show both jets) shows worse  regurgitation despite being done at a lower   Nyquist limit. In comparison to post operative TEE, perivalvular leak  which was present psot op has worsened.   Conclusion(s)/Recommendation(s): TEE or cardiac MRI could be considered  for further quantification of valve dysfunction.   EKG:  EKG is not ordered today.    Recent Labs: 09/28/2020: ALT 24; B Natriuretic Peptide 2,498.6; Magnesium 1.9; TSH 1.620 09/30/2020: Hemoglobin 10.2; Platelets 216 10/02/2020: BUN 11; Creatinine, Ser 0.81; Potassium 3.9; Sodium 141  Recent Lipid Panel    Component Value Date/Time   CHOL 151 03/21/2020 0805   CHOL 132 08/07/2019 0811   TRIG 98.0 03/21/2020 0805   TRIG 162 (H) 10/25/2005 1507   HDL 67.70 03/21/2020 0805   HDL 61 08/07/2019 0811   CHOLHDL 2 03/21/2020 0805   VLDL 19.6 03/21/2020 0805   LDLCALC 63 03/21/2020 0805   LDLCALC 52 08/07/2019 0811   LDLDIRECT 151.0  01/05/2016 0748     Risk Assessment/Calculations:           Physical Exam:    VS:  BP 100/60   Pulse 76   Ht 5\' 7"  (1.702 m)  Wt 217 lb 6.4 oz (98.6 kg)   BMI 34.05 kg/m     Wt Readings from Last 3 Encounters:  10/06/20 217 lb 6.4 oz (98.6 kg)  09/29/20 225 lb 15.5 oz (102.5 kg)  09/22/20 242 lb 11.2 oz (110.1 kg)     GEN:  Well nourished, well developed in no acute distress HEENT: Normal NECK: No JVD; No carotid bruits LYMPHATICS: No lymphadenopathy CARDIAC: RRR, 2/6 systolic murmur at the RUSB, 1/6 diastolic decrescendo murmur at the LLSB RESPIRATORY:  Clear to auscultation without rales, wheezing or rhonchi  ABDOMEN: Soft, non-tender, non-distended MUSCULOSKELETAL:  No edema; No deformity  SKIN: Warm and dry NEUROLOGIC:  Alert and oriented x 3 PSYCHIATRIC:  Normal affect   ASSESSMENT:    1. Perivalvular leak of prosthetic heart valve, initial encounter   2. Preop cardiovascular exam   3. Severe aortic insufficiency   4. Heart block AV second degree    PLAN:    In order of problems listed above:  The patient's cardiac imaging studies are reviewed.  He has progressively worsening paravalvular regurgitation involving his 25 mm stented bioprosthetic aortic valve, now in the severe range.  The patient is symptomatic with symptoms of congestive heart failure, progressive exertional dyspnea, and ongoing issues with anemia possibly with a component of hemolysis.  His case is further complicated by the presence of high-grade AV block and history of heparin-induced thrombocytopenia.  We reviewed a variety of potential treatment options today.  These include both transcatheter and open surgical treatments.  Regardless of treatment type, the patient will require permanent pacing because of his high-grade underlying conduction disease.  The patient will need to undergo further evaluation with a transesophageal echo study to further assess the mechanism of his paravalvular  regurgitation.  It appears that there is a gap around both the left coronary cusp and the right coronary cusp of the aortic valve annulus causing to separate leaks, cumulatively resulting in a large leak.  I reviewed the risks, indications, and alternatives of transesophageal echo with the patient and his wife today.  They understand the risks of serious complication such as esophageal injury or perforation are very low.  He also will require right and left heart catheterization to assess his hemodynamics and native/bypass graft anatomy.  It is important to note that the patient has a history of heparin-induced thrombocytopenia and he should not receive any heparin products during his heart catheterization procedure.  He has undergone left radial harvesting and will require femoral access for cardiac catheterization.  I have reviewed the risks, indications, and alternatives to cardiac catheterization, possible angioplasty, and stenting with the patient. Risks include but are not limited to bleeding, infection, vascular injury, stroke, myocardial infection, arrhythmia, kidney injury, radiation-related injury in the case of prolonged fluoroscopy use, emergency cardiac surgery, and death. The patient understands the risks of serious complication is 1-2 in 6045 with diagnostic cardiac cath and 1-2% or less with angioplasty/stenting.  Once he has completed his TEE and cardiac catheterization studies, he will be referred for formal cardiac surgical consultation with Dr. Cyndia Bent as part of a multidisciplinary approach to his care.  If we treat him percutaneously, I would consider placement of a temp/perm pacemaker lead followed by balloon valvuloplasty of the surgical sewing ring in order to better expand the surgical prosthesis and potentially seal the paravalvular leaks.  Placement of the temp/perm pacemaker would be considered because the likelihood of complete AV block and pacemaker dependence would be exceedingly high  after  this procedure.  It is possible that following balloon valvuloplasty, the patient could need valve in valve TAVR if there is leaflet damage or central regurgitation.  It is also possible that he would need paravalvular leak closure if the balloon valvuloplasty procedure is unsuccessful.  After his valve procedure is completed, he would require permanent pacemaker placement.  All of these scenarios are reviewed with the patient and his wife today.     Shared Decision Making/Informed Consent The risks [esophageal damage, perforation (1:10,000 risk), bleeding, pharyngeal hematoma as well as other potential complications associated with conscious sedation including aspiration, arrhythmia, respiratory failure and death], benefits (treatment guidance and diagnostic support) and alternatives of a transesophageal echocardiogram were discussed in detail with Mr. Gilberg and he is willing to proceed.     Medication Adjustments/Labs and Tests Ordered: Current medicines are reviewed at length with the patient today.  Concerns regarding medicines are outlined above.  Orders Placed This Encounter  Procedures   Basic metabolic panel   CBC with Differential/Platelet   Haptoglobin   No orders of the defined types were placed in this encounter.   Patient Instructions  Medication Instructions:  Your provider recommends that you continue on your current medications as directed. Please refer to the Current Medication list given to you today.   *If you need a refill on your cardiac medications before your next appointment, please call your pharmacy*  Lab Work: TODAY! BMET, CBC, haptoglobin If you have labs (blood work) drawn today and your tests are completely normal, you will receive your results only by: [place] (if you have MyChart) OR A paper copy in the mail If you have any lab test that is abnormal or we need to change your treatment, we will call you to review the  results.  Testing/Procedures: Your physician has requested that you have a TEE. During a TEE, sound waves are used to create images of your heart. It provides your doctor with information about the size and shape of your heart and how well your heart's chambers and valves are working. In this test, a transducer is attached to the end of a flexible tube that's guided down your throat and into your esophagus (the tube leading from you mouth to your stomach) to get a more detailed image of your heart. You are not awake for the procedure. Please see the instruction sheet given to you today. For further information please visit [email or web address].   Your physician has requested that you have a cardiac catheterization. Cardiac catheterization is used to diagnose and/or treat various heart conditions. Doctors may recommend this procedure for a number of different reasons. The most common reason is to evaluate chest pain. Chest pain can be a symptom of coronary artery disease (CAD), and cardiac catheterization can show whether plaque is narrowing or blocking your heart's arteries. This procedure is also used to evaluate the valves, as well as measure the blood flow and oxygen levels in different parts of your heart. For further information please visit [email or web address]. Please follow instruction sheet, as given.   Your tentative date for surgery is 10/18/2020 (for planning).  We will be in touch to schedule further follow up!   Signed, Sherren Mocha, MD  10/06/2020 4:45 PM    [facility]

## 2020-10-06 NOTE — H&P (View-Only) (Signed)
Cardiology Office Note:    Date:  10/06/2020   ID:  Gregory [name], DOB Aug 26, 1950, MRN [id number]  PCP:  Jinny Sanders, MD   [facility] Providers Cardiologist:  [name], MD Cardiology APP:  Lendon Colonel, NP     Referring MD: Jinny Sanders, MD   Chief Complaint  Patient presents with   Shortness of Breath    History of Present Illness:    Gregory [name] is a 70 y.o. male presenting for evaluation of severe paravalvular aortic regurgitation.  The patient is here with his wife today.  The patient has been followed for several years with aortic valve disease and felt to be asymptomatic.  He developed symptoms in 2021 and underwent cardiac catheterization for preoperative planning for treatment of severe aortic insufficiency and stenosis with mixed aortic valve disease.  He was found to have multivessel coronary artery disease and ultimately treated with multivessel CABG and bioprosthetic aortic valve replacement.  He underwent CABG/AVR 03/06/2019 with a 25 mm [name] Intuity rapid deployment valve and CABG x 3 with a left radial to PDA, LIMA-LAD, and pedicled RIMA - RPL.  The patient's postoperative course was complicated by heparin-induced thrombocytopenia.  He was ultimately treated with several months of warfarin and recovered uneventfully.  He otherwise did well with surgery and was getting along fairly well until recent months when he has developed progressive problems with anemia.  He is also been having some heart palpitations, progressive fatigue, and exertional dyspnea.  The patient was hospitalized September 28, 2020 as he was found to have 2-1 heart block on his outpatient monitor and also admitted to worsening symptoms of swelling and weight gain.  He was hospitalized with the intention of IV diuresis for treatment of congestive heart failure, follow-up echo imaging to assess his aortic valve, and inpatient electrophysiology consultation for consideration of  permanent pacemaker placement.  During his hospitalization, he was found to have severe paravalvular regurgitation in 2 different areas around the sewing ring of his bovine bioprosthesis.  He is here with his wife today. He denies lightheadedness, syncope, orthopnea, PND, or chest pain. He does complain of fatigue, exertional dyspnea, and leg swelling. However, his edema has improved with medical therapy. He has no other complaints today.   Past Medical History:  Diagnosis Date   Alcohol abuse    Anxiety    Aortic valve disease    Arthritis    Coronary artery disease    Glaucoma    Hypertension    OSA (obstructive sleep apnea)    Pre-diabetes    Shingles     Past Surgical History:  Procedure Laterality Date   AORTIC VALVE REPLACEMENT N/A 03/06/2019   Procedure: AORTIC VALVE REPLACEMENT (AVR), USING INTUITY 25MM;  Surgeon: Wonda Olds, MD;  Location: [place];  Service: Open Heart Surgery;  Laterality: N/A;   CARDIAC CATHETERIZATION     CATARACT EXTRACTION     CORONARY ARTERY BYPASS GRAFT N/A 03/06/2019   Procedure: CORONARY ARTERY BYPASS GRAFTING (CABG), ON PUMP, TIMES THREE, USING BILATERAL INTERNAL MAMMARIES AND LEFT RADIAL ARTERY HARVEST;  Surgeon: Wonda Olds, MD;  Location: [place];  Service: Open Heart Surgery;  Laterality: N/A;  BILATERAL IMA   LEFT HEART CATH AND CORONARY ANGIOGRAPHY N/A 03/04/2019   Procedure: LEFT HEART CATH AND CORONARY ANGIOGRAPHY;  Surgeon: Burnell Blanks, MD;  Location: [place] [facility];  Service: Cardiovascular;  Laterality: N/A;   None     RADIAL ARTERY HARVEST  Left 03/06/2019   Procedure: RADIAL ARTERY HARVEST;  Surgeon: Wonda Olds, MD;  Location: [place];  Service: Open Heart Surgery;  Laterality: Left;   TEE WITHOUT CARDIOVERSION N/A 03/06/2019   Procedure: TRANSESOPHAGEAL ECHOCARDIOGRAM (TEE);  Surgeon: Wonda Olds, MD;  Location: [place];  Service: Open Heart Surgery;  Laterality: N/A;    Current Medications: Current Meds   Medication Sig   acetaminophen (TYLENOL) 500 MG tablet Take 1,000 mg by mouth every 6 (six) hours as needed for moderate pain.   allopurinol (ZYLOPRIM) 100 MG tablet Take 1 tablet (100 mg total) by mouth daily.   ALPRAZolam (XANAX) 0.5 MG tablet TAKE 1 TABLET BY MOUTH ONCE DAILY AS NEEDED FOR ANXIETY TAKE  30  MINUTES  PRIOR  TO  PLANE  FLIGHT  OR  AS  NEEDED  FOR  ANXIETY   aspirin EC 81 MG tablet Take 1 tablet (81 mg total) by mouth daily. Swallow whole.   atorvastatin (LIPITOR) 80 MG tablet Take 1 tablet (80 mg total) by mouth daily.   colchicine 0.6 MG tablet Take 2 tablets by mouth once daily (Patient taking differently: Take 0.3 mg by mouth daily. Take 1 tablet (0.3 mg) by mouth scheduled every day, may take 1 additional tablet if needed for gout flare)   furosemide (LASIX) 20 MG tablet Take 2 tablets in the morning and 1 tablet in the afternoon   ketoconazole (NIZORAL) 2 % shampoo Apply 1 application topically 2 (two) times a week. (Patient taking differently: Apply 1 application topically once a week.)   latanoprost (XALATAN) 0.005 % ophthalmic solution Place 1 drop into both eyes every morning.    Misc Natural Products (OSTEO BI-FLEX/5-LOXIN ADVANCED) TABS Take 2 tablets by mouth daily.   potassium chloride SA (KLOR-CON) 20 MEQ tablet Take 1 tablet (20 mEq total) by mouth daily.   sacubitril-valsartan (ENTRESTO) 49-51 MG Take 1 tablet by mouth 2 (two) times daily.   sertraline (ZOLOFT) 50 MG tablet Take 1 tablet (50 mg total) by mouth daily.   spironolactone (ALDACTONE) 25 MG tablet Take 1 tablet (25 mg total) by mouth daily.   traZODone (DESYREL) 50 MG tablet Take 0.5-1 tablets (25-50 mg total) by mouth at bedtime as needed for sleep. (Patient taking differently: Take 50 mg by mouth at bedtime as needed for sleep.)     Allergies:   Heparin   Social History   Socioeconomic History   Marital status: Married    Spouse name: Cindi   Number of children: 3   Years of education: Not on  file   Highest education level: Not on file  Occupational History   Occupation: Architect  Tobacco Use   Smoking status: Never   Smokeless tobacco: Never  Vaping Use   Vaping Use: Never used  Substance and Sexual Activity   Alcohol use: Yes    Alcohol/week: 12.0 standard drinks    Types: 12 Cans of beer per week   Drug use: Yes    Frequency: 2.0 times per week    Types: Marijuana    Comment: gummies   Sexual activity: Not on file  Other Topics Concern   Not on file  Social History Narrative   Lives at home with wife.   Social Determinants of Health   Financial Resource Strain: Low Risk    Difficulty of Paying Living Expenses: Not hard at all  Food Insecurity: No Food Insecurity   Worried About Charity fundraiser in the Last Year: Never true  Ran Out of Food in the Last Year: Never true  Transportation Needs: No Transportation Needs   Lack of Transportation (Medical): No   Lack of Transportation (Non-Medical): No  Physical Activity: Inactive   Days of Exercise per Week: 0 days   Minutes of Exercise per Session: 0 min  Stress: Stress Concern Present   Feeling of Stress : To some extent  Social Connections: Not on file     Family History: The patient's family history includes Alcohol abuse in his father; Breast cancer in his sister; Cancer in his mother. He was adopted.  ROS:   Please see the history of present illness.    All other systems reviewed and are negative.  EKGs/Labs/Other Studies Reviewed:    The following studies were reviewed today: Cardiac CTA: Aortic Prosthesis: A 25 mm [name] Intuity bioprosthetic valve is present in the aortic position. The leaflets of the prosthesis are freely mobile without evidence of leaflet thickening or thrombosis. There are 2 areas of paravalvular leak under the RCC and LCC related. The leak under the RCC measures ~3.7 mm x ~8.9 mm. The leak under the [place] is ~3.8 mm x 9.1 mm. The leak under then [facility] is likely  related to heavy annular calcifications under the [place].   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   Sinotubular Junction: 41 mm (double oblique).   Ascending Thoracic Aorta: 41 mm (double oblique)   Coronary Arteries: Normal coronary origin. Right dominance. The study was performed without use of NTG and is insufficient for plaque evaluation. A LIMA to the LAD is seen. A RIMA to the RCA is seen. 3-vessel coronary calcifications are seen in the native coronaries.   Right Atrium: Right atrial size is dilated.   Right Ventricle: The right ventricular cavity is dilated.   Left Atrium: Left atrial size is normal in size with no left atrial appendage filling defect.   Left Ventricle: The ventricular cavity size is severely dilated however, the entire left ventricle was not included in the FOV.   Pulmonary arteries: Dilated pulmonary artery suggestive of pulmonary hypertension.   Pulmonary veins: Normal pulmonary venous drainage.   Pericardium: Normal thickness with no significant effusion or calcium present.   Mitral Valve: The mitral valve is normal structure without significant calcification.   Extra-cardiac findings: See attached radiology report for non-cardiac structures.   IMPRESSION: 1. 25 mm [name] Intuity bioprosthetic valve with 2 identified paravalvular leaks under the RCC and LCC as detailed above.   2. 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   3. S/p CABG.  LIMA-LAD and RIMA-RCA are seen.   4. Severely dilated left ventricular cavity.   5. Dilated pulmonary artery suggestive of pulmonary hypertension.  CTA Chest/Abd/Pelvis: AORTA:   Minimal Aortic Diameter-22 x 19 mm   Severity of Aortic Calcification-moderate   RIGHT PELVIS:   Right Common Iliac Artery -   Minimal Diameter-11.8 x 13.3 mm   Tortuosity-mild   Calcification-moderate to severe   Right External Iliac Artery -   Minimal Diameter-10.9 x 11.3 mm    Tortuosity-severe   Calcification-none   Right Common Femoral Artery -   Minimal Diameter-11.9 x 10.9 mm   Tortuosity-mild   Calcification-mild   LEFT PELVIS:   Left Common Iliac Artery -   Minimal Diameter-14.8 x 13.9 mm   Tortuosity-mild   Calcification-moderate to severe   Left External Iliac Artery -   Minimal Diameter-11.2 x 12.0 mm   Tortuosity-moderate   Calcification-none  Left Common Femoral Artery -   Minimal Diameter-10.7 x 10.9 mm   Tortuosity-mild   Calcification-mild   Review of the MIP images confirms the above findings.   IMPRESSION: 1. Vascular findings and measurements pertinent to potential TAVR procedure, as detailed above. 2. Status post aortic valve replacement with what appears to be a stented bioprosthesis. 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 Ascending thoracic aortic aneurysm. Recommend semi-annual imaging followup by CTA or MRA and referral to cardiothoracic surgery if not already obtained. This recommendation follows 2010 ACCF/AHA/AATS/ACR/ASA/SCA/SCAI/SIR/STS/SVM Guidelines for the Diagnosis and Management of Patients With Thoracic Aortic Disease. Circulation. 2010; 121: W299-B716. Aortic aneurysm NOS (ICD10-I71.9). 3. Aortic atherosclerosis, in addition to left main and 3 vessel coronary artery disease. Status post median sternotomy for CABG including LIMA to the LAD. 4. Mild cardiomegaly. 5. Small bilateral pleural effusions lying dependently. 6. Colonic diverticulosis without evidence of acute diverticulitis at this time. 7. Additional incidental findings, as above.  Echo 09/29/2020: 1. The aortic valve has been Intuity 25 mm valve in the aortic position.  There are two peri-valvular jets that are moderate in severity; in  summation suggestive of severe regurgitation. There is a 25 mm INTUITY  valve present in the aortic position.   Procedure Date: 03/06/2019.   2. Left ventricular ejection fraction, by estimation, is 40 to 45%. The  left ventricle has mildly decreased function. The left ventricle  demonstrates global hypokinesis. The left ventricular internal cavity size  was moderately dilated. There is mild  concentric left ventricular hypertrophy. Left ventricular diastolic  parameters are indeterminate.   3. Right ventricular systolic function is normal. The right ventricular  size is normal. There is normal pulmonary artery systolic pressure.   4. Left atrial size was severely dilated.   5. The mitral valve is normal in structure. No evidence of mitral valve  regurgitation.   6. Aortic dilatation noted. There is mild dilatation of the aortic root,  measuring 44 mm. There is moderate dilatation of the ascending aorta,  measuring 45 mm.   Comparison(s): A prior study was performed on 12/07/2019. LV function is  slightly worse, LV dilation has dilated. Comparison of the apical 5  chamber view and PSAX view (which does not show both jets) shows worse  regurgitation despite being done at a lower   Nyquist limit. In comparison to post operative TEE, perivalvular leak  which was present psot op has worsened.   Conclusion(s)/Recommendation(s): TEE or cardiac MRI could be considered  for further quantification of valve dysfunction.   EKG:  EKG is not ordered today.    Recent Labs: 09/28/2020: ALT 24; B Natriuretic Peptide 2,498.6; Magnesium 1.9; TSH 1.620 09/30/2020: Hemoglobin 10.2; Platelets 216 10/02/2020: BUN 11; Creatinine, Ser 0.81; Potassium 3.9; Sodium 141  Recent Lipid Panel    Component Value Date/Time   CHOL 151 03/21/2020 0805   CHOL 132 08/07/2019 0811   TRIG 98.0 03/21/2020 0805   TRIG 162 (H) 10/25/2005 1507   HDL 67.70 03/21/2020 0805   HDL 61 08/07/2019 0811   CHOLHDL 2 03/21/2020 0805   VLDL 19.6 03/21/2020 0805   LDLCALC 63 03/21/2020 0805   LDLCALC 52 08/07/2019 0811   LDLDIRECT 151.0  01/05/2016 0748     Risk Assessment/Calculations:           Physical Exam:    VS:  BP 100/60   Pulse 76   Ht 5\' 7"  (1.702 m)  Wt 217 lb 6.4 oz (98.6 kg)   BMI 34.05 kg/m     Wt Readings from Last 3 Encounters:  10/06/20 217 lb 6.4 oz (98.6 kg)  09/29/20 225 lb 15.5 oz (102.5 kg)  09/22/20 242 lb 11.2 oz (110.1 kg)     GEN:  Well nourished, well developed in no acute distress HEENT: Normal NECK: No JVD; No carotid bruits LYMPHATICS: No lymphadenopathy CARDIAC: RRR, 2/6 systolic murmur at the RUSB, 1/6 diastolic decrescendo murmur at the LLSB RESPIRATORY:  Clear to auscultation without rales, wheezing or rhonchi  ABDOMEN: Soft, non-tender, non-distended MUSCULOSKELETAL:  No edema; No deformity  SKIN: Warm and dry NEUROLOGIC:  Alert and oriented x 3 PSYCHIATRIC:  Normal affect   ASSESSMENT:    1. Perivalvular leak of prosthetic heart valve, initial encounter   2. Preop cardiovascular exam   3. Severe aortic insufficiency   4. Heart block AV second degree    PLAN:    In order of problems listed above:  The patient's cardiac imaging studies are reviewed.  He has progressively worsening paravalvular regurgitation involving his 25 mm stented bioprosthetic aortic valve, now in the severe range.  The patient is symptomatic with symptoms of congestive heart failure, progressive exertional dyspnea, and ongoing issues with anemia possibly with a component of hemolysis.  His case is further complicated by the presence of high-grade AV block and history of heparin-induced thrombocytopenia.  We reviewed a variety of potential treatment options today.  These include both transcatheter and open surgical treatments.  Regardless of treatment type, the patient will require permanent pacing because of his high-grade underlying conduction disease.  The patient will need to undergo further evaluation with a transesophageal echo study to further assess the mechanism of his paravalvular  regurgitation.  It appears that there is a gap around both the left coronary cusp and the right coronary cusp of the aortic valve annulus causing to separate leaks, cumulatively resulting in a large leak.  I reviewed the risks, indications, and alternatives of transesophageal echo with the patient and his wife today.  They understand the risks of serious complication such as esophageal injury or perforation are very low.  He also will require right and left heart catheterization to assess his hemodynamics and native/bypass graft anatomy.  It is important to note that the patient has a history of heparin-induced thrombocytopenia and he should not receive any heparin products during his heart catheterization procedure.  He has undergone left radial harvesting and will require femoral access for cardiac catheterization.  I have reviewed the risks, indications, and alternatives to cardiac catheterization, possible angioplasty, and stenting with the patient. Risks include but are not limited to bleeding, infection, vascular injury, stroke, myocardial infection, arrhythmia, kidney injury, radiation-related injury in the case of prolonged fluoroscopy use, emergency cardiac surgery, and death. The patient understands the risks of serious complication is 1-2 in 4627 with diagnostic cardiac cath and 1-2% or less with angioplasty/stenting.  Once he has completed his TEE and cardiac catheterization studies, he will be referred for formal cardiac surgical consultation with Dr. Cyndia Bent as part of a multidisciplinary approach to his care.  If we treat him percutaneously, I would consider placement of a temp/perm pacemaker lead followed by balloon valvuloplasty of the surgical sewing ring in order to better expand the surgical prosthesis and potentially seal the paravalvular leaks.  Placement of the temp/perm pacemaker would be considered because the likelihood of complete AV block and pacemaker dependence would be exceedingly high  after  this procedure.  It is possible that following balloon valvuloplasty, the patient could need valve in valve TAVR if there is leaflet damage or central regurgitation.  It is also possible that he would need paravalvular leak closure if the balloon valvuloplasty procedure is unsuccessful.  After his valve procedure is completed, he would require permanent pacemaker placement.  All of these scenarios are reviewed with the patient and his wife today.     Shared Decision Making/Informed Consent The risks [esophageal damage, perforation (1:10,000 risk), bleeding, pharyngeal hematoma as well as other potential complications associated with conscious sedation including aspiration, arrhythmia, respiratory failure and death], benefits (treatment guidance and diagnostic support) and alternatives of a transesophageal echocardiogram were discussed in detail with Gregory [name] and he is willing to proceed.     Medication Adjustments/Labs and Tests Ordered: Current medicines are reviewed at length with the patient today.  Concerns regarding medicines are outlined above.  Orders Placed This Encounter  Procedures   Basic metabolic panel   CBC with Differential/Platelet   Haptoglobin   No orders of the defined types were placed in this encounter.   Patient Instructions  Medication Instructions:  Your provider recommends that you continue on your current medications as directed. Please refer to the Current Medication list given to you today.   *If you need a refill on your cardiac medications before your next appointment, please call your pharmacy*  Lab Work: TODAY! BMET, CBC, haptoglobin If you have labs (blood work) drawn today and your tests are completely normal, you will receive your results only by: [place] (if you have MyChart) OR A paper copy in the mail If you have any lab test that is abnormal or we need to change your treatment, we will call you to review the  results.  Testing/Procedures: Your physician has requested that you have a TEE. During a TEE, sound waves are used to create images of your heart. It provides your doctor with information about the size and shape of your heart and how well your heart's chambers and valves are working. In this test, a transducer is attached to the end of a flexible tube that's guided down your throat and into your esophagus (the tube leading from you mouth to your stomach) to get a more detailed image of your heart. You are not awake for the procedure. Please see the instruction sheet given to you today. For further information please visit [email or web address].   Your physician has requested that you have a cardiac catheterization. Cardiac catheterization is used to diagnose and/or treat various heart conditions. Doctors may recommend this procedure for a number of different reasons. The most common reason is to evaluate chest pain. Chest pain can be a symptom of coronary artery disease (CAD), and cardiac catheterization can show whether plaque is narrowing or blocking your heart's arteries. This procedure is also used to evaluate the valves, as well as measure the blood flow and oxygen levels in different parts of your heart. For further information please visit [email or web address]. Please follow instruction sheet, as given.   Your tentative date for surgery is 10/18/2020 (for planning).  We will be in touch to schedule further follow up!   Signed, Sherren Mocha, MD  10/06/2020 4:45 PM    [facility]

## 2020-10-07 ENCOUNTER — Other Ambulatory Visit: Payer: Self-pay

## 2020-10-07 DIAGNOSIS — T8203XA Leakage of heart valve prosthesis, initial encounter: Secondary | ICD-10-CM

## 2020-10-07 DIAGNOSIS — I351 Nonrheumatic aortic (valve) insufficiency: Secondary | ICD-10-CM

## 2020-10-07 LAB — CBC WITH DIFFERENTIAL/PLATELET
Basophils Absolute: 0 10*3/uL (ref 0.0–0.2)
Basos: 0 %
EOS (ABSOLUTE): 0.1 10*3/uL (ref 0.0–0.4)
Eos: 2 %
Hematocrit: 32.2 % — ABNORMAL LOW (ref 37.5–51.0)
Hemoglobin: 10.2 g/dL — ABNORMAL LOW (ref 13.0–17.7)
Immature Grans (Abs): 0 10*3/uL (ref 0.0–0.1)
Immature Granulocytes: 0 %
Lymphocytes Absolute: 1.7 10*3/uL (ref 0.7–3.1)
Lymphs: 29 %
MCH: 27.4 pg (ref 26.6–33.0)
MCHC: 31.7 g/dL (ref 31.5–35.7)
MCV: 87 fL (ref 79–97)
Monocytes Absolute: 0.6 10*3/uL (ref 0.1–0.9)
Monocytes: 11 %
Neutrophils Absolute: 3.4 10*3/uL (ref 1.4–7.0)
Neutrophils: 58 %
Platelets: 220 10*3/uL (ref 150–450)
RBC: 3.72 x10E6/uL — ABNORMAL LOW (ref 4.14–5.80)
RDW: 16.7 % — ABNORMAL HIGH (ref 11.6–15.4)
WBC: 5.8 10*3/uL (ref 3.4–10.8)

## 2020-10-07 LAB — BASIC METABOLIC PANEL
BUN/Creatinine Ratio: 24 (ref 10–24)
BUN: 29 mg/dL — ABNORMAL HIGH (ref 8–27)
CO2: 23 mmol/L (ref 20–29)
Calcium: 8.8 mg/dL (ref 8.6–10.2)
Chloride: 103 mmol/L (ref 96–106)
Creatinine, Ser: 1.23 mg/dL (ref 0.76–1.27)
Glucose: 105 mg/dL — ABNORMAL HIGH (ref 70–99)
Potassium: 5.2 mmol/L (ref 3.5–5.2)
Sodium: 141 mmol/L (ref 134–144)
eGFR: 62 mL/min/{1.73_m2} (ref >59)

## 2020-10-07 LAB — HAPTOGLOBIN: Haptoglobin: <10 mg/dL — ABNORMAL LOW (ref 34–355)

## 2020-10-09 NOTE — Consult Note (Addendum)
HEART AND [place] VALVE TEAM  Cardiology Consultation:   Patient ID: Gregory [name] MRN: [id number]; DOB: [birth year]  Admit date: 10/10/2020 Date of Consult: 10/10/2020  Primary Care Provider: Jinny Sanders, MD [facility] Cardiologist: [name], MD  [facility] Electrophysiologist:  None    Patient Profile:   Gregory [name] is a 70 y.o. male with a hx of CAD and aortic valve disease s/p CABG/AVR 03/06/2019 with a 25 mm [name] Intuity rapid deployment valve and CABG x 3 with a left radial to PDA, LIMA-LAD, and pedicled RIMA-RPL by Dr. Julien Girt, TAA, post op HIT, HTN, obesity, chronic diastolic CHF with recent admission for acute on chronic diastolic CHF, HAVB, worsening anemia and found to have severe bioprosthetic valve dysfunction with severe AI and hemolytic anemia who is being seen today for the evaluation of bioprosthetic valve dysfunction at the request of [place].  History of Present Illness:   The patient underwent CABG/AVR 03/06/2019 with a 25 mm [name] Intuity rapid deployment valve and CABG x 3 with a left radial to PDA, LIMA-LAD, and pedicled RIMA - RPL. The patient's postoperative course was complicated by heparin-induced thrombocytopenia.  He was ultimately treated with several months of warfarin and recovered uneventfully.  He was in his usual state of health until the last several months when he developed progressive fatigue, exertional dyspnea, fatigue, palpitations as well as anemia.   He was admitted in 09/2020 for 2-1 heart block discovered on an outpatient monitor as well as symptoms of volume overload. During his admission he was diuresed with IV lasix and seen by electrophysiology. Repeat echo showed EF 40-45%, LV dilation, mild LVH, mild aortic root dilatation (4.84mm) and moderate ascending aorta dilation (75mm) as well as severe paravalvular regurgitation in 2 different areas around the sewing ring of his bovine  bioprosthesis. Gated cardiac CT done during this admission showed a 25 mm [name] Intuity bioprosthetic valve is present in the aortic position. The leaflets of the prosthesis are freely mobile without evidence of leaflet thickening or thrombosis. There are 2 areas of paravalvular leak under the RCC and LCC related. The leak under the RCC measures ~3.7 mm x ~8.9 mm. The leak under the [place] is ~3.8 mm x 9.1 mm. The leak under then [facility] is likely related to heavy annular calcifications under the [place]. 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 Sinotubular Junction: 41 mm (double oblique). Ascending Thoracic Aorta: 41 mm (double oblique). Severely dilated left ventricular cavity. CTA of the aorta and iliac vessels demonstrate what appear to be adequate pelvic vascular access to facilitate a transfemoral approach.     The patient was feeling better after diuresis and was ultimately discharge home. Permanent pacing was not recommended given possible need for redo surgery.   He was seen in the office by Dr. Burt Knack on 10/06/20. Dr. Burt Knack felt he needed to undergo further evaluation with a transesophageal echo study to further assess the mechanism of his paravalvular regurgitation.  It appears that there is a gap around both the left coronary cusp and the right coronary cusp of the aortic valve annulus causing to separate leaks, cumulatively resulting in a large leak.  If we treat him percutaneously, we would consider placement of a temp/perm pacemaker lead followed by balloon valvuloplasty of the surgical sewing ring in order to better expand the surgical prosthesis and potentially seal the paravalvular leaks.  Placement of the temp/perm pacemaker would be considered because the likelihood of  complete AV block and pacemaker dependence would be exceedingly high after this procedure.  It is possible that following balloon valvuloplasty, the patient could need valve in valve TAVR if there is leaflet damage or  central regurgitation.  It is also possible that he would need paravalvular leak closure if the balloon valvuloplasty procedure is unsuccessful.  After his valve procedure is completed, he would require permanent pacemaker placement. He also recommended a L/RCH to assess his coronary anatomy and bypass grafts as well as hemodynamics. Of note, NO HEPARIN PRODUCTS TO BE USED GIVEN HISTORY OF HIT.  Most recent labs showed a creat of 1.23, GFR 62, K 5.2, Na 141, Hg 10.2, Haptoglobin <10 and LDH 939. TEE and L/RHC were set up for today.   TEE showed EF 30-35%, mild RV enlargement/dysfunction, severe AI with severe peri annular regurgitation seen at least 3 areas around right and left sinus. Poor stent skirt appostion below annulus. Also, appears to be limited motion of one of the leaflets. Elevated transgastric gradient mean 39 peak 86 mmHg likely reprsents combination of leaflet dysfunction and severe AR. Estimated AVA 1.64 cm2. Mild to moderate aortic valve stenosis. Of note the patient had bronchospasm and low sats throughout case.  L/RHC showed 3V native CAD with 3/3 patent bypass grafts with normal wedge and right heart pressures as well as normal cardiac output. Angiomax was used in substitution of heparin.   Patient seen in cath lab holding area. He is feeling fine. No chest pain. Breathing much better since recent admission and 20 lbs diuresis. No LE edema, orthopnea or PND. No dizziness or syncope. No blood in stool or urine. No palpitations. Reports having all native teeth with regular dental work.     Past Medical History:  Diagnosis Date   Alcohol abuse    Anxiety    Aortic valve disease    Arthritis    Coronary artery disease    Glaucoma    Hypertension    OSA (obstructive sleep apnea)    Pre-diabetes    Shingles     Past Surgical History:  Procedure Laterality Date   AORTIC VALVE REPLACEMENT N/A 03/06/2019   Procedure: AORTIC VALVE REPLACEMENT (AVR), USING INTUITY 25MM;  Surgeon:  Wonda Olds, MD;  Location: [place];  Service: Open Heart Surgery;  Laterality: N/A;   CARDIAC CATHETERIZATION     CATARACT EXTRACTION     CORONARY ARTERY BYPASS GRAFT N/A 03/06/2019   Procedure: CORONARY ARTERY BYPASS GRAFTING (CABG), ON PUMP, TIMES THREE, USING BILATERAL INTERNAL MAMMARIES AND LEFT RADIAL ARTERY HARVEST;  Surgeon: Wonda Olds, MD;  Location: [place];  Service: Open Heart Surgery;  Laterality: N/A;  BILATERAL IMA   LEFT HEART CATH AND CORONARY ANGIOGRAPHY N/A 03/04/2019   Procedure: LEFT HEART CATH AND CORONARY ANGIOGRAPHY;  Surgeon: Burnell Blanks, MD;  Location: [place] [facility];  Service: Cardiovascular;  Laterality: N/A;   None     RADIAL ARTERY HARVEST Left 03/06/2019   Procedure: RADIAL ARTERY HARVEST;  Surgeon: Wonda Olds, MD;  Location: [place];  Service: Open Heart Surgery;  Laterality: Left;   TEE WITHOUT CARDIOVERSION N/A 03/06/2019   Procedure: TRANSESOPHAGEAL ECHOCARDIOGRAM (TEE);  Surgeon: Wonda Olds, MD;  Location: [place];  Service: Open Heart Surgery;  Laterality: N/A;     Home Medications:  Prior to Admission medications   Medication Sig Start Date End Date Taking? Authorizing Provider  acetaminophen (TYLENOL) 500 MG tablet Take 1,000 mg by mouth every 6 (six)  hours as needed for moderate pain.   Yes [provider]  allopurinol (ZYLOPRIM) 100 MG tablet Take 1 tablet (100 mg total) by mouth daily. 03/18/20  Yes Bedsole, Amy E, MD  ALPRAZolam (XANAX) 0.5 MG tablet TAKE 1 TABLET BY MOUTH ONCE DAILY AS NEEDED FOR ANXIETY TAKE  30  MINUTES  PRIOR  TO  PLANE  FLIGHT  OR  AS  NEEDED  FOR  ANXIETY 10/26/19  Yes Bedsole, Amy E, MD  aspirin EC 81 MG tablet Take 1 tablet (81 mg total) by mouth daily. Swallow whole. 08/06/19  Yes [name], MD  atorvastatin (LIPITOR) 80 MG tablet Take 1 tablet (80 mg total) by mouth daily. 12/22/19  Yes Bedsole, Amy E, MD  colchicine 0.6 MG tablet Take 2 tablets by mouth once daily Patient taking  differently: Take 0.3 mg by mouth daily. Take 1 tablet (0.3 mg) by mouth scheduled every day, may take 1 additional tablet if needed for gout flare 01/08/20  Yes Bedsole, Amy E, MD  furosemide (LASIX) 20 MG tablet Take 2 tablets in the morning and 1 tablet in the afternoon 10/02/20  Yes Almyra Deforest, PA  ketoconazole (NIZORAL) 2 % shampoo Apply 1 application topically 2 (two) times a week. Patient taking differently: Apply 1 application topically once a week. 05/09/20  Yes Bedsole, Amy E, MD  latanoprost (XALATAN) 0.005 % ophthalmic solution Place 1 drop into both eyes every morning.  10/25/18  Yes [provider]  Misc Natural Products (OSTEO BI-FLEX/5-LOXIN ADVANCED) TABS Take 2 tablets by mouth daily.   Yes [provider]  potassium chloride SA (KLOR-CON) 20 MEQ tablet Take 1 tablet (20 mEq total) by mouth daily. 10/03/20  Yes Meng, Isaac Laud, PA  sacubitril-valsartan (ENTRESTO) 49-51 MG Take 1 tablet by mouth 2 (two) times daily. 10/02/20  Yes Almyra Deforest, PA  sertraline (ZOLOFT) 50 MG tablet Take 1 tablet (50 mg total) by mouth daily. 12/22/19  Yes Bedsole, Amy E, MD  spironolactone (ALDACTONE) 25 MG tablet Take 1 tablet (25 mg total) by mouth daily. 10/03/20  Yes Almyra Deforest, PA  traZODone (DESYREL) 50 MG tablet Take 0.5-1 tablets (25-50 mg total) by mouth at bedtime as needed for sleep. Patient taking differently: Take 50 mg by mouth at bedtime as needed for sleep. 12/22/19  Yes Bedsole, Amy E, MD    Inpatient Medications: Scheduled Meds:  Continuous Infusions:  PRN Meds:   Allergies:    Allergies  Allergen Reactions   Heparin     HIT antibody and SRA positive    Social History:   Social History   Socioeconomic History   Marital status: Married    Spouse name: Cindi   Number of children: 3   Years of education: Not on file   Highest education level: Not on file  Occupational History   Occupation: Architect  Tobacco Use   Smoking status: Never   Smokeless tobacco:  Never  Vaping Use   Vaping Use: Never used  Substance and Sexual Activity   Alcohol use: Yes    Alcohol/week: 12.0 standard drinks    Types: 12 Cans of beer per week   Drug use: Yes    Frequency: 2.0 times per week    Types: Marijuana    Comment: gummies   Sexual activity: Not on file  Other Topics Concern   Not on file  Social History Narrative   Lives at home with wife.   Social Determinants of Health   Financial Resource Strain: Low  Risk    Difficulty of Paying Living Expenses: Not hard at all  Food Insecurity: No Food Insecurity   Worried About [place] in the Last Year: Never true   Ran Out of Food in the Last Year: Never true  Transportation Needs: No Transportation Needs   Lack of Transportation (Medical): No   Lack of Transportation (Non-Medical): No  Physical Activity: Inactive   Days of Exercise per Week: 0 days   Minutes of Exercise per Session: 0 min  Stress: Stress Concern Present   Feeling of Stress : To some extent  Social Connections: Not on file  Intimate Partner Violence: Not At Risk   Fear of Current or Ex-Partner: No   Emotionally Abused: No   Physically Abused: No   Sexually Abused: No    Family History:    Family History  Adopted: Yes  Problem Relation Age of Onset   Cancer Mother        breast cancer   Alcohol abuse Father    Breast cancer Sister      ROS:  Please see the history of present illness.  All other ROS reviewed and negative.     Physical Exam/Data:   Vitals:   10/10/20 1147 10/10/20 1410  BP: 122/65   Resp: 20   Temp: 98.1 F (36.7 C)   TempSrc: Temporal   SpO2: 97% 93%  Weight: 93.4 kg   Height: 5\' 7"  (1.702 m)     Intake/Output Summary (Last 24 hours) at 10/10/2020 1503 Last data filed at 10/10/2020 1314 Gross per 24 hour  Intake 200 ml  Output --  Net 200 ml   Last 3 Weights 10/10/2020 10/06/2020 09/29/2020  Weight (lbs) 206 lb 217 lb 6.4 oz 225 lb 15.5 oz  Weight (kg) 93.441 kg 98.612 kg 102.5  kg     Body mass index is 32.26 kg/m.  General:  Well nourished, well developed, in no acute distress HEENT: normal Lymph: no adenopathy Neck: no JVD Endocrine:  No thryomegaly Vascular: No carotid bruits; FA pulses 2+ bilaterally without bruits  Cardiac:  normal S1, S2; RRR; systolic and soft diastolic murmur Lungs:  clear to auscultation bilaterally, no wheezing, rhonchi or rales  Abd: soft, nontender, no hepatomegaly  Ext: no edema Musculoskeletal:  No deformities, BUE and BLE strength normal and equal Skin: warm and dry  Neuro:  CNs 2-12 intact, no focal abnormalities noted Psych:  Normal affect   EKG:  The EKG was personally reviewed and demonstrates:  Sinus rhythm Mobitz II 2-degree AV block, LBBB. HR 38 bpm   Relevant CV Studies: Outpatient monitor 9/12-9/28/22 Predominant rhythm sinus bradycardia High degree heart block Mobitz Type I and II with probable episodes of complete heart block Junctional rhythm  ____________________  Echo 09/29/20 IMPRESSIONS   1. The aortic valve has been Intuity 25 mm valve in the aortic position.  There are two peri-valvular jets that are moderate in severity; in  summation suggestive of severe regurgitation. There is a 25 mm INTUITY  valve present in the aortic position.  Procedure Date: 03/06/2019.   2. Left ventricular ejection fraction, by estimation, is 40 to 45%. The  left ventricle has mildly decreased function. The left ventricle  demonstrates global hypokinesis. The left ventricular internal cavity size  was moderately dilated. There is mild  concentric left ventricular hypertrophy. Left ventricular diastolic  parameters are indeterminate.   3. Right ventricular systolic function is normal. The right ventricular  size is normal. There is  normal pulmonary artery systolic pressure.   4. Left atrial size was severely dilated.   5. The mitral valve is normal in structure. No evidence of mitral valve  regurgitation.   6. Aortic  dilatation noted. There is mild dilatation of the aortic root,  measuring 44 mm. There is moderate dilatation of the ascending aorta,  measuring 45 mm.   Comparison(s): A prior study was performed on 12/07/2019. LV function is  slightly worse, LV dilation has dilated. Comparison of the apical 5  chamber view and PSAX view (which does not show both jets) shows worse regurgitation despite being done at a lower   Nyquist limit. In comparison to post operative TEE, perivalvular leak  which was present psot op has worsened.   Conclusion(s)/Recommendation(s): TEE or cardiac MRI could be considered for further quantification of valve dysfunction.   ____________________________  Cardiac CT 09/30/20 IMPRESSION: 1. 25 mm [name] Intuity bioprosthetic valve with 2 identified paravalvular leaks under the RCC and LCC as detailed above.   2. 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   3. S/p CABG.  LIMA-LAD and RIMA-RCA are seen.   4. Severely dilated left ventricular cavity.   5. Dilated pulmonary artery suggestive of pulmonary hypertension.   [place] T. O'Neal, MD   ______________________  CT chest/abdomen/pelvis 09/30/20 VASCULAR MEASUREMENTS PERTINENT TO TAVR:   AORTA:   Minimal Aortic Diameter-22 x 19 mm   Severity of Aortic Calcification-moderate   RIGHT PELVIS:   Right Common Iliac Artery -   Minimal Diameter-11.8 x 13.3 mm   Tortuosity-mild   Calcification-moderate to severe   Right External Iliac Artery -   Minimal Diameter-10.9 x 11.3 mm   Tortuosity-severe   Calcification-none   Right Common Femoral Artery -   Minimal Diameter-11.9 x 10.9 mm   Tortuosity-mild   Calcification-mild   LEFT PELVIS:   Left Common Iliac Artery -   Minimal Diameter-14.8 x 13.9 mm   Tortuosity-mild   Calcification-moderate to severe   Left External Iliac Artery -   Minimal Diameter-11.2 x 12.0 mm   Tortuosity-moderate   Calcification-none   Left Common  Femoral Artery -   Minimal Diameter-10.7 x 10.9 mm   Tortuosity-mild   Calcification-mild   Review of the MIP images confirms the above findings.   IMPRESSION: 1. Vascular findings and measurements pertinent to potential TAVR procedure, as detailed above. 2. Status post aortic valve replacement with what appears to be a stented bioprosthesis. 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 Ascending thoracic aortic aneurysm. Recommend semi-annual imaging followup by CTA or MRA and referral to cardiothoracic surgery if not already obtained. This recommendation follows 2010 ACCF/AHA/AATS/ACR/ASA/SCA/SCAI/SIR/STS/SVM Guidelines for the Diagnosis and Management of Patients With Thoracic Aortic Disease. Circulation. 2010; 121: Z025-E527. Aortic aneurysm NOS (ICD10-I71.9). 3. Aortic atherosclerosis, in addition to left main and 3 vessel coronary artery disease. Status post median sternotomy for CABG including LIMA to the LAD. 4. Mild cardiomegaly. 5. Small bilateral pleural effusions lying dependently. 6. Colonic diverticulosis without evidence of acute diverticulitis at this time. 7. Additional incidental findings, as above.   _____________________  TEE 10/10/20 IMPRESSIONS   1. Exam limited by patient bronchospasm and desats with coughing  througout study despite propofol.   2. Left ventricular ejection fraction, by estimation, is 30 to 35%. The  left ventricle has normal function. The left ventricle demonstrates global  hypokinesis. The left ventricular internal cavity size was severely  dilated. Left ventricular diastolic  function could not be evaluated.   3. Right ventricular systolic function is mildly reduced. The right  ventricular size is mildly enlarged.   4. Left atrial size was moderately dilated. No left atrial/left atrial  appendage thrombus was detected.   5. Right atrial size was  mildly dilated.   6. The mitral valve is abnormal. Mild mitral valve regurgitation.   7. 25 mm Intuity hybrid valve. Severe peri annular regurgitatiion seen at  least 3 areas around right and left sinus. Poor stent skirt appostion  below annulus. Also appears to be limited motion of one of the leaflets.  Elevated transgastric gradient mean  39 peak 86 mmHg likely reprsents combination of leaflet dysfunction and  severe AR Estimted AVA 1.64 cm2 . The aortic valve has been  repaired/replaced. Aortic valve regurgitation is severe. Mild to moderate  aortic valve stenosis. There is a 25 mm Intuity  valve present in the aortic position. Procedure Date: 03/06/19.   _________________________  L/RHC 10/11/18: AORTIC ARCH ANGIOGRAPHY  RIGHT HEART CATH AND CORONARY/GRAFT ANGIOGRAPHY   Conclusion      Prox LAD to Mid LAD lesion is 90% stenosed.   Mid LAD-1 lesion is 70% stenosed.   Prox RCA lesion is 70% stenosed.   RV Branch lesion is 80% stenosed.   Prox Cx lesion is 50% stenosed.   Mid LAD-2 lesion is 100% stenosed.   LIMA graft was visualized by angiography and is normal in caliber.   The graft exhibits no disease.    Laboratory Data:  High Sensitivity Troponin:  No results for input(s): TROPONINIHS in the last 720 hours.   Chemistry Recent Labs  Lab 10/06/20 1629 10/10/20 1450  NA 141 138  K 5.2 4.8  CL 103  --   CO2 23  --   GLUCOSE 105*  --   BUN 29*  --   CREATININE 1.23  --   CALCIUM 8.8  --     No results for input(s): PROT, ALBUMIN, AST, ALT, ALKPHOS, BILITOT in the last 168 hours. Hematology Recent Labs  Lab 10/06/20 1629 10/10/20 1450  WBC 5.8  --   RBC 3.72*  --   HGB 10.2* 10.5*  HCT 32.2* 31.0*  MCV 87  --   MCH 27.4  --   MCHC 31.7  --   RDW 16.7*  --   PLT 220  --    BNPNo results for input(s): BNP, PROBNP in the last 168 hours.  DDimer No results for input(s): DDIMER in the last 168 hours.   Radiology/Studies:  ECHO TEE  Result Date:  10/10/2020    TRANSESOPHOGEAL ECHO REPORT   Patient Name:   Gregory [name] Date of Exam: 10/10/2020 Medical Rec #:  [id number]         Height:       67.0 in Accession #:    [id number]        Weight:       217.4 lb Date of Birth:  [birth year]         BSA:          2.095 m Patient Age:    39 years          BP:           122/65 mmHg Patient Gender: M                 HR:           80 bpm. Exam  Location:  Inpatient Procedure: 3D Echo, Transesophageal Echo, Cardiac Doppler and Color Doppler Indications:     I35.1 Nonrheumatic aortic (valve) insufficiency  History:         Patient has prior history of Echocardiogram examinations. CHF                  and Cardiomyopathy, Previous Myocardial Infarction, Prior CABG                  and Abnormal ECG, Aortic Valve Disease, Arrythmias:Atrial                  Fibrillation, Signs/Symptoms:Murmur; Risk Factors:Dyslipidemia,                  Hypertension and Sleep Apnea.                  Aortic Valve: 25 mm Intuity valve is present in the aortic                  position. Procedure Date: 03/06/19.  Sonographer:     Roseanna Rainbow RDCS Referring Phys:  [place] Diagnosing Phys: Jenkins Rouge MD PROCEDURE: After discussion of the risks and benefits of a TEE, an informed consent was obtained from the patient. The transesophogeal probe was passed without difficulty through the esophogus of the patient. Imaged were obtained with the patient in a left lateral decubitus position. Sedation performed by performing physician. The patient was monitored while under deep sedation. Anesthestetic sedation was provided intravenously by Anesthesiology: 264mg  of Propofol. The patient developed Respiratory depression during the procedure. IMPRESSIONS  1. Exam limited by patient bronchospasm and desats with coughing througout study despite propofol.  2. Left ventricular ejection fraction, by estimation, is 30 to 35%. The left ventricle has normal function. The left ventricle demonstrates global  hypokinesis. The left ventricular internal cavity size was severely dilated. Left ventricular diastolic function could not be evaluated.  3. Right ventricular systolic function is mildly reduced. The right ventricular size is mildly enlarged.  4. Left atrial size was moderately dilated. No left atrial/left atrial appendage thrombus was detected.  5. Right atrial size was mildly dilated.  6. The mitral valve is abnormal. Mild mitral valve regurgitation.  7. 25 mm Intuity hybrid valve. Severe peri annular regurgitatiion seen at least 3 areas around right and left sinus. Poor stent skirt appostion below annulus. Also appears to be limited motion of one of the leaflets. Elevated transgastric gradient mean 39 peak 86 mmHg likely reprsents combination of leaflet dysfunction and severe AR Estimted AVA 1.64 cm2 . The aortic valve has been repaired/replaced. Aortic valve regurgitation is severe. Mild to moderate aortic valve stenosis. There is a 25 mm Intuity valve present in the aortic position. Procedure Date: 03/06/19. FINDINGS  Left Ventricle: Left ventricular ejection fraction, by estimation, is 30 to 35%. The left ventricle has normal function. The left ventricle demonstrates global hypokinesis. The left ventricular internal cavity size was severely dilated. Left ventricular  diastolic function could not be evaluated. Right Ventricle: The right ventricular size is mildly enlarged. Right vetricular wall thickness was not assessed. Right ventricular systolic function is mildly reduced. Left Atrium: Left atrial size was moderately dilated. No left atrial/left atrial appendage thrombus was detected. Right Atrium: Right atrial size was mildly dilated. Pericardium: There is no evidence of pericardial effusion. Mitral Valve: The mitral valve is abnormal. Mild mitral valve regurgitation. Tricuspid Valve: The tricuspid valve is normal in structure. Tricuspid valve regurgitation is mild.  Aortic Valve: 25 mm Intuity hybrid valve.  Severe peri annular regurgitatiion seen at least 3 areas around right and left sinus. Poor stent skirt appostion below annulus. Also appears to be limited motion of one of the leaflets. Elevated transgastric gradient mean 39 peak 86 mmHg likely reprsents combination of leaflet dysfunction and severe AR Estimted AVA 1.64 cm2. The aortic valve has been repaired/replaced. Aortic valve regurgitation is severe. Mild to moderate aortic stenosis is present. Aortic valve mean gradient measures 39.0 mmHg. Aortic valve peak gradient measures 85.4 mmHg. Aortic valve area, by VTI measures 1.32 cm. There is a 25 mm Intuity valve present in the aortic position. Procedure Date: 03/06/19. Pulmonic Valve: The pulmonic valve was grossly normal. Pulmonic valve regurgitation is mild. Aorta: The aortic root was not well visualized. IAS/Shunts: No atrial level shunt detected by color flow Doppler. Additional Comments: Exam limited by patient bronchospasm and desats with coughing througout study despite propofol.  LEFT VENTRICLE PLAX 2D LVOT diam:     2.19 cm LV SV:         135 LV SV Index:   64 LVOT Area:     3.77 cm  AORTIC VALVE AV Area (Vmax):    1.64 cm AV Area (Vmean):   1.77 cm AV Area (VTI):     1.32 cm AV Vmax:           462.00 cm/s AV Vmean:          285.000 cm/s AV VTI:            1.020 m AV Peak Grad:      85.4 mmHg AV Mean Grad:      39.0 mmHg LVOT Vmax:         201.00 cm/s LVOT Vmean:        134.000 cm/s LVOT VTI:          0.357 m LVOT/AV VTI ratio: 0.35  AORTA Ao Asc diam: 3.59 cm  SHUNTS Systemic VTI:  0.36 m Systemic Diam: 2.19 cm Jenkins Rouge MD Electronically signed by Jenkins Rouge MD Signature Date/Time: 10/10/2020/2:51:13 PM    Final      STS Risk Calculator: Procedure: Isolated AVR Risk of Mortality: 2.512% Renal Failure: 4.788% Permanent Stroke: 0.834% Prolonged Ventilation: 11.930% DSW Infection: 0.356% Reoperation: 6.136% Morbidity or Mortality: 15.608% Short Length of Stay: 26.343% Long  Length of Stay: 6.105%   _____________________  [facility] Cardiomyopathy Questionnaire  KCCQ-12 10/10/2020  1 a. Ability to shower/bathe Slightly limited  1 b. Ability to walk 1 block Extremely limited  1 c. Ability to hurry/jog Other, Did not do  2. Edema feet/ankles/legs 3+ times a week, not every day  3. Limited by fatigue All of the time  4. Limited by dyspnea At least once a day  5. Sitting up / on 3+ pillows Never over the past 2 weeks  6. Limited enjoyment of life Extremely limited  7. Rest of life w/ symptoms Not at all satisfied  8 a. Participation in hobbies Slightly limited  8 b. Participation in chores Moderately limited  8 c. Visiting family/friends N/A, did not do for other reasons      Assessment and Plan:   Gregory [name] is a 70 y.o. male with symptoms of severe, symptomatic, paravalvular regurgitation in 2 different areas around the sewing ring of his bovine bioprosthesis.   Gated cardiac CT showed a 25 mm [name] Intuity bioprosthetic valve is present in the aortic position. The leaflets of the prosthesis are freely  mobile without evidence of leaflet thickening or thrombosis. There are 2 areas of paravalvular leak under the RCC and LCC related. The leak under the RCC measures ~3.7 mm x ~8.9 mm. The leak under the [place] is ~3.8 mm x 9.1 mm. The leak under then [facility] is likely related to heavy annular calcifications under the [place]. 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 Sinotubular Junction: 41 mm (double oblique). Ascending Thoracic Aorta: 41 mm (double oblique). Severely dilated left ventricular cavity. CTA of the aorta and iliac vessels demonstrate what appear to be adequate pelvic vascular access to facilitate a transfemoral approach.    TEE showed EF 30-35%, mild RV enlargement/dysfunction, severe AI with severe peri annular regurgitation seen at least 3 areas around right and left sinus. Poor stent skirt appostion below annulus. Also, appears to be  limited motion of one of the leaflets. Elevated transgastric gradient mean 39 peak 86 mmHg likely reprsents combination of leaflet dysfunction and severe AR. Estimated AVA 1.64 cm2. Mild to moderate aortic valve stenosis. Of note the patient had bronchospasm and low sats throughout case.  L/RHC today showed 3V native CAD with 3/3 patent bypass grafts with normal wedge and right heart pressures as well as normal cardiac output.     I have reviewed the natural history of bioprosthethic valve dysfunction with the patient. We have discussed the limitations of medical therapy and the poor prognosis associated with symptomatic bioprosthethic valve dysfunction. We have reviewed potential treatment options, including palliative medical therapy, conventional surgical aortic valve replacement, and transcatheter aortic valve replacement. We discussed treatment options in the context of this patient's specific comorbid medical conditions.    The patient's predicted risk of mortality with conventional aortic valve replacement is 2.512% primarily based on age, previous sternotomy, CAD, LV dysfunction, obesity, and anemia. Our tentative plan is for admission 10/17 for a temp-perm pacemaker. The following day we would attempt balloon valvuloplasty with back up valve in valve TAVR or PVL plug, if necessary. Once his bioprosthethic valve dysfunction is corrected, we will plan for for BiV -ICD placement with Dr. Quentin Ore on on 10/20.   Dr. Cyndia Bent to follow.    For questions or updates, please contact [place] Please consult [email or web address] for contact info under    Signed, Angelena Form, PA-C  10/10/2020 3:03 PM   Chart reviewed, patient examined, agree with above.  This 70 year old gentleman is status post CABG x3 with aortic valve replacement using a 25 mm Intuity rapid deployment valve in March 2021.  He recently presented with New York Heart Association class IV symptoms of shortness of breath and marked  lower extremity edema with an ejection fraction of 30 to 35% and multiple paravalvular leaks.  His gated cardiac CTA showed that the leaflets were freely mobile without evidence of thickening or thrombosis although there was some suggestion on TEE today that one of the leaflets was not moving normally.  There are 2 jets of paravalvular leakage seen on CTA under the RCC and LCC.  There were 3 jets seen today by TEE.  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  Cardiac catheterization today showed patent bypass grafts with a PA pressure of 35/15 and wedge pressure of 10.  Right atrial pressure is 5.  PA sat was 60% with a cardiac index of 6.4 by Fick and 3.2 by thermodilution.  He has also had unexplained anemia felt to be secondary to hemolysis from  the paravalvular leaks.  He has a history of HIT and received Angiomax instead of heparin for his procedure today.  I think his operative risk for redo sternotomy and redo aortic valve replacement would be significantly higher than that estimated by the STS risk profile especially considering his aortic root aneurysm, history of HIT preventing use of heparin, and patent bilateral internal mammary artery grafts with a radial artery graft coming off of the right internal mammary artery graft.  I think the best option for treating him will be balloon dilatation of the Intuity valve to try to get better apposition of the stent cuff to the annulus.  This could result in valve dysfunction and central aortic insufficiency that would require urgent valve in valve TAVR.  I discussed all this with the patient and his wife by telephone and all of their questions have been answered.  He will tentatively be scheduled for next Tuesday.  Gaye Pollack, MD

## 2020-10-10 ENCOUNTER — Ambulatory Visit (HOSPITAL_COMMUNITY): Payer: Medicare HMO | Admitting: Certified Registered Nurse Anesthetist

## 2020-10-10 ENCOUNTER — Other Ambulatory Visit: Payer: Self-pay

## 2020-10-10 ENCOUNTER — Ambulatory Visit (HOSPITAL_COMMUNITY)
Admission: RE | Admit: 2020-10-10 | Discharge: 2020-10-11 | Disposition: A | Discharge status: Home / Self Care | Payer: Medicare HMO | Source: Ambulatory Visit | Attending: Internal Medicine | Admitting: Internal Medicine

## 2020-10-10 ENCOUNTER — Ambulatory Visit (HOSPITAL_COMMUNITY)
Admission: RE | Disposition: A | Discharge status: Home / Self Care | Payer: Self-pay | Source: Ambulatory Visit | Attending: Internal Medicine

## 2020-10-10 ENCOUNTER — Encounter: Payer: Self-pay | Admitting: Surgery

## 2020-10-10 ENCOUNTER — Ambulatory Visit (HOSPITAL_BASED_OUTPATIENT_CLINIC_OR_DEPARTMENT_OTHER)
Admission: RE | Admit: 2020-10-10 | Discharge: 2020-10-10 | Disposition: A | Discharge status: Home / Self Care | Payer: Medicare HMO | Source: Ambulatory Visit | Attending: Cardiovascular Disease | Admitting: Cardiovascular Disease

## 2020-10-10 ENCOUNTER — Encounter (HOSPITAL_COMMUNITY)
Admission: RE | Disposition: A | Discharge status: Home / Self Care | Payer: Self-pay | Source: Ambulatory Visit | Attending: Internal Medicine

## 2020-10-10 ENCOUNTER — Institutional Professional Consult (permissible substitution): Payer: Medicare HMO | Admitting: Surgery

## 2020-10-10 ENCOUNTER — Encounter (HOSPITAL_COMMUNITY): Payer: Self-pay | Admitting: Cardiovascular Disease

## 2020-10-10 DIAGNOSIS — I110 Hypertensive heart disease with heart failure: Secondary | ICD-10-CM | POA: Insufficient documentation

## 2020-10-10 DIAGNOSIS — I252 Old myocardial infarction: Secondary | ICD-10-CM | POA: Insufficient documentation

## 2020-10-10 DIAGNOSIS — I2510 Atherosclerotic heart disease of native coronary artery without angina pectoris: Secondary | ICD-10-CM | POA: Diagnosis not present

## 2020-10-10 DIAGNOSIS — T8203XA Leakage of heart valve prosthesis, initial encounter: Secondary | ICD-10-CM | POA: Insufficient documentation

## 2020-10-10 DIAGNOSIS — I5040 Unspecified combined systolic (congestive) and diastolic (congestive) heart failure: Secondary | ICD-10-CM | POA: Diagnosis not present

## 2020-10-10 DIAGNOSIS — R9431 Abnormal electrocardiogram [ECG] [EKG]: Secondary | ICD-10-CM | POA: Insufficient documentation

## 2020-10-10 DIAGNOSIS — Y839 Surgical procedure, unspecified as the cause of abnormal reaction of the patient, or of later complication, without mention of misadventure at the time of the procedure: Secondary | ICD-10-CM | POA: Insufficient documentation

## 2020-10-10 DIAGNOSIS — I352 Nonrheumatic aortic (valve) stenosis with insufficiency: Secondary | ICD-10-CM | POA: Insufficient documentation

## 2020-10-10 DIAGNOSIS — X58XXXA Exposure to other specified factors, initial encounter: Secondary | ICD-10-CM | POA: Insufficient documentation

## 2020-10-10 DIAGNOSIS — I5043 Acute on chronic combined systolic (congestive) and diastolic (congestive) heart failure: Secondary | ICD-10-CM | POA: Diagnosis not present

## 2020-10-10 DIAGNOSIS — I088 Other rheumatic multiple valve diseases: Secondary | ICD-10-CM | POA: Diagnosis not present

## 2020-10-10 DIAGNOSIS — E785 Hyperlipidemia, unspecified: Secondary | ICD-10-CM | POA: Insufficient documentation

## 2020-10-10 DIAGNOSIS — I509 Heart failure, unspecified: Secondary | ICD-10-CM

## 2020-10-10 DIAGNOSIS — Z951 Presence of aortocoronary bypass graft: Secondary | ICD-10-CM | POA: Insufficient documentation

## 2020-10-10 DIAGNOSIS — I351 Nonrheumatic aortic (valve) insufficiency: Secondary | ICD-10-CM | POA: Diagnosis present

## 2020-10-10 DIAGNOSIS — R2689 Other abnormalities of gait and mobility: Secondary | ICD-10-CM | POA: Insufficient documentation

## 2020-10-10 DIAGNOSIS — Z888 Allergy status to other drugs, medicaments and biological substances status: Secondary | ICD-10-CM | POA: Insufficient documentation

## 2020-10-10 DIAGNOSIS — G4730 Sleep apnea, unspecified: Secondary | ICD-10-CM | POA: Insufficient documentation

## 2020-10-10 DIAGNOSIS — I2582 Chronic total occlusion of coronary artery: Secondary | ICD-10-CM | POA: Insufficient documentation

## 2020-10-10 DIAGNOSIS — Z7982 Long term (current) use of aspirin: Secondary | ICD-10-CM | POA: Insufficient documentation

## 2020-10-10 DIAGNOSIS — I429 Cardiomyopathy, unspecified: Secondary | ICD-10-CM | POA: Insufficient documentation

## 2020-10-10 DIAGNOSIS — E559 Vitamin D deficiency, unspecified: Secondary | ICD-10-CM | POA: Diagnosis not present

## 2020-10-10 DIAGNOSIS — Z79899 Other long term (current) drug therapy: Secondary | ICD-10-CM | POA: Diagnosis not present

## 2020-10-10 DIAGNOSIS — I441 Atrioventricular block, second degree: Secondary | ICD-10-CM | POA: Insufficient documentation

## 2020-10-10 DIAGNOSIS — I214 Non-ST elevation (NSTEMI) myocardial infarction: Secondary | ICD-10-CM | POA: Diagnosis not present

## 2020-10-10 DIAGNOSIS — E7800 Pure hypercholesterolemia, unspecified: Secondary | ICD-10-CM | POA: Diagnosis not present

## 2020-10-10 HISTORY — PX: RIGHT HEART CATH AND CORONARY/GRAFT ANGIOGRAPHY: CATH118265

## 2020-10-10 HISTORY — PX: TEE WITHOUT CARDIOVERSION: SHX5443

## 2020-10-10 HISTORY — PX: AORTIC ARCH ANGIOGRAPHY: CATH118224

## 2020-10-10 LAB — POCT I-STAT 7, (LYTES, BLD GAS, ICA,H+H)
Acid-base deficit: 1 mmol/L (ref 0.0–2.0)
Bicarbonate: 25.2 mmol/L (ref 20.0–28.0)
Calcium, Ion: 1.22 mmol/L (ref 1.15–1.40)
HCT: 31 % — ABNORMAL LOW (ref 39.0–52.0)
Hemoglobin: 10.5 g/dL — ABNORMAL LOW (ref 13.0–17.0)
O2 Saturation: 96 %
Potassium: 4.7 mmol/L (ref 3.5–5.1)
Sodium: 138 mmol/L (ref 135–145)
TCO2: 27 mmol/L (ref 22–32)
pCO2 arterial: 46.7 mmHg (ref 32.0–48.0)
pH, Arterial: 7.339 — ABNORMAL LOW (ref 7.350–7.450)
pO2, Arterial: 86 mmHg (ref 83.0–108.0)

## 2020-10-10 LAB — POCT I-STAT EG7
Acid-Base Excess: 0 mmol/L (ref 0.0–2.0)
Bicarbonate: 26.5 mmol/L (ref 20.0–28.0)
Calcium, Ion: 1.22 mmol/L (ref 1.15–1.40)
HCT: 31 % — ABNORMAL LOW (ref 39.0–52.0)
Hemoglobin: 10.5 g/dL — ABNORMAL LOW (ref 13.0–17.0)
O2 Saturation: 68 %
Potassium: 4.8 mmol/L (ref 3.5–5.1)
Sodium: 138 mmol/L (ref 135–145)
TCO2: 28 mmol/L (ref 22–32)
pCO2, Ven: 48.5 mmHg (ref 44.0–60.0)
pH, Ven: 7.345 (ref 7.250–7.430)
pO2, Ven: 38 mmHg (ref 32.0–45.0)

## 2020-10-10 LAB — ECHO TEE
AR max vel: 1.64 cm2
AV Area VTI: 1.32 cm2
AV Area mean vel: 1.77 cm2
AV Mean grad: 39 mmHg
AV Peak grad: 85.4 mmHg
Ao pk vel: 4.62 m/s

## 2020-10-10 SURGERY — ECHOCARDIOGRAM, TRANSESOPHAGEAL
Anesthesia: Monitor Anesthesia Care

## 2020-10-10 SURGERY — AORTIC ARCH ANGIOGRAPHY
Anesthesia: LOCAL

## 2020-10-10 MED ORDER — SODIUM CHLORIDE 0.9 % IV SOLN: 250.0000 mL | INTRAVENOUS | Status: DC | PRN

## 2020-10-10 MED ORDER — FENTANYL CITRATE (PF) 100 MCG/2ML IJ SOLN
INTRAMUSCULAR | Status: DC | PRN
  Administered 2020-10-10: 25 ug via INTRAVENOUS

## 2020-10-10 MED ORDER — SODIUM CHLORIDE 0.9 % IV SOLN: INTRAVENOUS | Status: DC | PRN

## 2020-10-10 MED ORDER — ACETAMINOPHEN 325 MG PO TABS: 650.0000 mg | ORAL_TABLET | ORAL | Status: DC | PRN

## 2020-10-10 MED ORDER — HYDRALAZINE HCL 20 MG/ML IJ SOLN: 10.0000 mg | INTRAMUSCULAR | Status: AC | PRN

## 2020-10-10 MED ORDER — BIVALIRUDIN TRIFLUOROACETATE 250 MG IV SOLR
INTRAVENOUS | Status: AC
  Filled 2020-10-10: qty 250

## 2020-10-10 MED ORDER — ASPIRIN 81 MG PO CHEW: 81.0000 mg | CHEWABLE_TABLET | ORAL | Status: DC

## 2020-10-10 MED ORDER — SODIUM CHLORIDE 0.9 % IV SOLN
INTRAVENOUS | Status: AC | PRN
  Administered 2020-10-10 (×2): 10 mL/h via INTRAVENOUS

## 2020-10-10 MED ORDER — VERAPAMIL HCL 2.5 MG/ML IV SOLN
INTRAVENOUS | Status: AC
  Filled 2020-10-10: qty 2

## 2020-10-10 MED ORDER — PROPOFOL 10 MG/ML IV BOLUS
INTRAVENOUS | Status: DC | PRN
  Administered 2020-10-10: 20 mg via INTRAVENOUS
  Administered 2020-10-10: 10 mg via INTRAVENOUS
  Administered 2020-10-10: 20 mg via INTRAVENOUS

## 2020-10-10 MED ORDER — SODIUM CHLORIDE 0.9 % IV SOLN: INTRAVENOUS | Status: DC

## 2020-10-10 MED ORDER — SPIRONOLACTONE 25 MG PO TABS
25.0000 mg | ORAL_TABLET | Freq: Every day | ORAL | Status: DC
  Administered 2020-10-11: 25 mg via ORAL
  Filled 2020-10-10: qty 1

## 2020-10-10 MED ORDER — LIDOCAINE HCL (PF) 1 % IJ SOLN
INTRAMUSCULAR | Status: AC
  Filled 2020-10-10: qty 30

## 2020-10-10 MED ORDER — SODIUM CHLORIDE 0.9 % IV SOLN: INTRAVENOUS | Status: AC

## 2020-10-10 MED ORDER — SODIUM CHLORIDE 0.9% FLUSH
3.0000 mL | Freq: Two times a day (BID) | INTRAVENOUS | Status: DC
  Administered 2020-10-10 – 2020-10-11 (×2): 3 mL via INTRAVENOUS

## 2020-10-10 MED ORDER — ONDANSETRON HCL 4 MG/2ML IJ SOLN: 4.0000 mg | Freq: Four times a day (QID) | INTRAMUSCULAR | Status: DC | PRN

## 2020-10-10 MED ORDER — SACUBITRIL-VALSARTAN 49-51 MG PO TABS
1.0000 | ORAL_TABLET | Freq: Two times a day (BID) | ORAL | Status: DC
  Administered 2020-10-11: 1 via ORAL
  Filled 2020-10-10 (×2): qty 1

## 2020-10-10 MED ORDER — BIVALIRUDIN BOLUS VIA INFUSION - CUPID
INTRAVENOUS | Status: DC | PRN
  Administered 2020-10-10: 70.05 mg via INTRAVENOUS

## 2020-10-10 MED ORDER — SODIUM CHLORIDE 0.9% FLUSH: 3.0000 mL | INTRAVENOUS | Status: DC | PRN

## 2020-10-10 MED ORDER — SERTRALINE HCL 50 MG PO TABS
50.0000 mg | ORAL_TABLET | Freq: Every day | ORAL | Status: DC
  Administered 2020-10-11: 50 mg via ORAL
  Filled 2020-10-10: qty 1

## 2020-10-10 MED ORDER — VERAPAMIL HCL 2.5 MG/ML IV SOLN
INTRAVENOUS | Status: DC | PRN
  Administered 2020-10-10: 10 mL via INTRA_ARTERIAL

## 2020-10-10 MED ORDER — PROPOFOL 500 MG/50ML IV EMUL
INTRAVENOUS | Status: DC | PRN
  Administered 2020-10-10: 100 ug/kg/min via INTRAVENOUS

## 2020-10-10 MED ORDER — FENTANYL CITRATE (PF) 100 MCG/2ML IJ SOLN
INTRAMUSCULAR | Status: AC
  Filled 2020-10-10: qty 2

## 2020-10-10 MED ORDER — MIDAZOLAM HCL 2 MG/2ML IJ SOLN
INTRAMUSCULAR | Status: AC
  Filled 2020-10-10: qty 2

## 2020-10-10 MED ORDER — MIDAZOLAM HCL 2 MG/2ML IJ SOLN
INTRAMUSCULAR | Status: DC | PRN
  Administered 2020-10-10: 1 mg via INTRAVENOUS

## 2020-10-10 MED ORDER — ORAL CARE MOUTH RINSE
15.0000 mL | Freq: Two times a day (BID) | OROMUCOSAL | Status: DC
  Administered 2020-10-10 – 2020-10-11 (×2): 15 mL via OROMUCOSAL

## 2020-10-10 MED ORDER — LIDOCAINE HCL (PF) 1 % IJ SOLN
INTRAMUSCULAR | Status: DC | PRN
  Administered 2020-10-10: 2 mL
  Administered 2020-10-10: 17 mL

## 2020-10-10 MED ORDER — ASPIRIN EC 81 MG PO TBEC
81.0000 mg | DELAYED_RELEASE_TABLET | Freq: Every day | ORAL | Status: DC
  Administered 2020-10-11: 81 mg via ORAL
  Filled 2020-10-10: qty 1

## 2020-10-10 MED ORDER — ALLOPURINOL 100 MG PO TABS
100.0000 mg | ORAL_TABLET | Freq: Every day | ORAL | Status: DC
  Administered 2020-10-11: 100 mg via ORAL
  Filled 2020-10-10: qty 1

## 2020-10-10 MED ORDER — IOHEXOL 350 MG/ML SOLN
INTRAVENOUS | Status: DC | PRN
  Administered 2020-10-10: 175 mL

## 2020-10-10 MED ORDER — BUTAMBEN-TETRACAINE-BENZOCAINE 2-2-14 % EX AERO
INHALATION_SPRAY | CUTANEOUS | Status: DC | PRN
  Administered 2020-10-10: 2 via TOPICAL

## 2020-10-10 MED ORDER — SODIUM CHLORIDE 0.9 % IV SOLN
INTRAVENOUS | Status: DC
  Administered 2020-10-10: 1000 mL via INTRAVENOUS

## 2020-10-10 MED ORDER — COLCHICINE 0.6 MG PO TABS
0.3000 mg | ORAL_TABLET | Freq: Every day | ORAL | Status: DC
  Administered 2020-10-11: 0.3 mg via ORAL
  Filled 2020-10-10: qty 0.5

## 2020-10-10 SURGICAL SUPPLY — 19 items
CATH INFINITI 5 FR AL2 (CATHETERS) ×1 IMPLANT
CATH INFINITI 5 FR IM (CATHETERS) ×1 IMPLANT
CATH INFINITI 5FR JL5 (CATHETERS) ×1 IMPLANT
CATH INFINITI 5FR MULTPACK ANG (CATHETERS) ×1 IMPLANT
CATH SWAN GANZ 7F STRAIGHT (CATHETERS) ×1 IMPLANT
DEVICE RAD COMP TR BAND LRG (VASCULAR PRODUCTS) ×1 IMPLANT
GLIDESHEATH SLEND SS 6F .021 (SHEATH) ×1 IMPLANT
KIT HEART LEFT (KITS) ×2 IMPLANT
KIT MICROPUNCTURE NIT STIFF (SHEATH) ×1 IMPLANT
PACK CARDIAC CATHETERIZATION (CUSTOM PROCEDURE TRAY) ×2 IMPLANT
SHEATH PINNACLE 5F 10CM (SHEATH) ×1 IMPLANT
SHEATH PINNACLE 7F 10CM (SHEATH) ×1 IMPLANT
SHEATH PROBE COVER 6X72 (BAG) ×1 IMPLANT
SYR MEDRAD MARK 7 150ML (SYRINGE) ×2 IMPLANT
TRANSDUCER W/STOPCOCK (MISCELLANEOUS) ×2 IMPLANT
TUBING CIL FLEX 10 FLL-RA (TUBING) ×2 IMPLANT
WIRE EMERALD 3MM-J .025X260CM (WIRE) ×1 IMPLANT
WIRE HI TORQ VERSACORE-J 145CM (WIRE) ×1 IMPLANT
WIRE J 3MM .035X145CM (WIRE) ×1 IMPLANT

## 2020-10-10 NOTE — Anesthesia Procedure Notes (Signed)
Date/Time: 10/10/2020 1:00 PM Performed by: Harden Mo, CRNA Pre-anesthesia Checklist: Patient identified, Emergency Drugs available, Suction available and Patient being monitored Patient Re-evaluated:Patient Re-evaluated prior to induction Oxygen Delivery Method: Nasal cannula Induction Type: IV induction Placement Confirmation: positive ETCO2 and breath sounds checked- equal and bilateral Dental Injury: Teeth and Oropharynx as per pre-operative assessment

## 2020-10-10 NOTE — Anesthesia Preprocedure Evaluation (Addendum)
Anesthesia Evaluation  Patient identified by MRN, date of birth, ID band Patient awake    Reviewed: Allergy & Precautions, NPO status , Patient's Chart, lab work & pertinent test results  History of Anesthesia Complications Negative for: history of anesthetic complications  Airway Mallampati: II  TM Distance: >3 FB Neck ROM: Full    Dental  (+) Dental Advisory Given   Pulmonary sleep apnea (does not use CPAP) ,    breath sounds clear to auscultation       Cardiovascular hypertension, Pt. on medications pulmonary hypertension(-) angina+ CAD, + Past MI (prior to CABG), + CABG, + Peripheral Vascular Disease (prox aortic dilation) and +CHF (Entresto)  + dysrhythmias Atrial Fibrillation + Valvular Problems/Murmurs (paravalvular leak s/p AVR)  Rhythm:Regular Rate:Bradycardia + Systolic murmurs and + Diastolic murmurs '21 multivessel CABG and bioprosthetic aortic valve replacement.  He underwent CABG/AVR 03/06/2019 with a 25 mm [name] Intuity rapid deployment valve and CABG x 3 with a left radial to PDA, LIMA-LAD, and pedicled RIMA - RPL.  09/30/2020 CT: 1. 25 mm [name] Intuity bioprosthetic valve with 2 identified paravalvular leaks under the RCC and LCC. 2. 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 3. S/p CABG. LIMA-LAD and RIMA-RCA are seen. 4. Severely dilated left ventricular cavity. 5. Dilated pulmonary artery suggestive of pulmonary hypertension.  09/29/2020 ECHO: 1. The aortic valve has been Intuity 25 mm valve in the aortic position. There are two peri-valvular jets that are moderate in severity; in summation suggestive of severe regurgitation. There is a 25 mm INTUITY valve present in the aortic position.  Procedure Date: 03/06/2019. 2. Left ventricular ejection fraction, by estimation, is 40 to 45%. The left ventricle has mildly decreased function. The left ventricle demonstrates global hypokinesis. The left ventricular  internal cavity size was moderately dilated. There is mild  concentric left ventricular hypertrophy   Neuro/Psych negative neurological ROS     GI/Hepatic GERD  Controlled,(+)     substance abuse  alcohol use,   Endo/Other  obese  Renal/GU negative Renal ROS     Musculoskeletal   Abdominal (+) + obese,   Peds  Hematology  (+) Blood dyscrasia (Hb 10.2), anemia ,   Anesthesia Other Findings   Reproductive/Obstetrics                            Anesthesia Physical Anesthesia Plan  ASA: 4  Anesthesia Plan: MAC   Post-op Pain Management:    Induction:   PONV Risk Score and Plan: 1 and Ondansetron and Treatment may vary due to age or medical condition  Airway Management Planned: Natural Airway and Nasal Cannula  Additional Equipment: None  Intra-op Plan:   Post-operative Plan:   Informed Consent: I have reviewed the patients History and Physical, chart, labs and discussed the procedure including the risks, benefits and alternatives for the proposed anesthesia with the patient or authorized representative who has indicated his/her understanding and acceptance.     Dental advisory given  Plan Discussed with: CRNA and Surgeon  Anesthesia Plan Comments:        Anesthesia Quick Evaluation

## 2020-10-10 NOTE — Interval H&P Note (Signed)
History and Physical Interval Note:  10/10/2020 12:12 PM  Gregory [name]  has presented today for surgery, with the diagnosis of valvular.  The various methods of treatment have been discussed with the patient and family. After consideration of risks, benefits and other options for treatment, the patient has consented to  Procedure(s): TRANSESOPHAGEAL ECHOCARDIOGRAM (TEE) (N/A) as a surgical intervention.  The patient's history has been reviewed, patient examined, no change in status, stable for surgery.  I have reviewed the patient's chart and labs.  Questions were answered to the patient's satisfaction.     Jenkins Rouge

## 2020-10-10 NOTE — Progress Notes (Signed)
Site area: left groin fa and fv sheaths Site Prior to Removal:  Level 0 Pressure Applied For: 25 minutes Manual:   yes Patient Status During Pull:  stable Post Pull Site:  Level 0 Post Pull Instructions Given:  yes Post Pull Pulses Present: left pt palpable Dressing Applied:  gauze and tegaderm Bedrest begins @ 1749 Comments:

## 2020-10-10 NOTE — Anesthesia Postprocedure Evaluation (Signed)
Anesthesia Post Note  Patient: Gregory [name]  Procedure(s) Performed: TRANSESOPHAGEAL ECHOCARDIOGRAM (TEE)     Patient location during evaluation: Cath Lab Anesthesia Type: MAC Level of consciousness: awake and alert, patient cooperative and oriented Pain management: pain level controlled Vital Signs Assessment: post-procedure vital signs reviewed and stable Respiratory status: spontaneous breathing, nonlabored ventilation, respiratory function stable and patient connected to nasal cannula oxygen Cardiovascular status: blood pressure returned to baseline and stable Postop Assessment: no apparent nausea or vomiting Anesthetic complications: no   No notable events documented.  Last Vitals:  Vitals:   10/10/20 1640 10/10/20 1655  BP: 116/66 114/65  Pulse: 78 77  Resp: 15 16  Temp:    SpO2: 96% 96%    Last Pain:  Vitals:   10/10/20 1625  TempSrc:   PainSc: 0-No pain                 Shawndale Kilpatrick,E. Denyse Fillion

## 2020-10-10 NOTE — Interval H&P Note (Signed)
History and Physical Interval Note:  10/10/2020 2:19 PM  Gregory [name]  has presented today for surgery, with the diagnosis of paravalvular leak s/p aortic valve replacement.  The various methods of treatment have been discussed with the patient and family. After consideration of risks, benefits and other options for treatment, the patient has consented to  Procedure(s): RIGHT/LEFT HEART CATH AND CORONARY/GRAFT ANGIOGRAPHY (N/A) as a surgical intervention.  The patient's history has been reviewed, patient examined, no change in status, stable for surgery.  I have reviewed the patient's chart and labs.  Questions were answered to the patient's satisfaction.    Cath Lab Visit (complete for each Cath Lab visit)  Clinical Evaluation Leading to the Procedure:   ACS: No.  Non-ACS:   Anginal/Heart Failure Classification: NYHA class III  Anti-ischemic medical therapy: No Therapy  Non-Invasive Test Results: No non-invasive testing performed  Prior CABG: Previous CABG  Parmvir Boomer

## 2020-10-10 NOTE — CV Procedure (Signed)
TEE: Propofol Anesthesia Patient with bronchospasm and low sats throughout case   Severe AR/ PVL at at least 3 locations near right and left cusps With poor apposition of stent ring to annulus Two of the leaflets Appear with normal motion one not with high gradients ? From  Leaflet malfunctions vs high flow from severe AR   Trivial MR  No ASD/PFO  No LAA thormbus  Normal RV  EF 30-35%   No effusion   See full report in [place]  Jenkins Rouge MD [facility]

## 2020-10-10 NOTE — Progress Notes (Signed)
Writer assessed left femoral site. Observed hematoma above dressing site. Applied pressure for 15 minutes total. After pressure was applied, hematoma seemed to resolve. PA Callie notified of finding and is coming to further assess patient. Awaiting further orders. Will continue to monitor.

## 2020-10-10 NOTE — Transfer of Care (Signed)
Immediate Anesthesia Transfer of Care Note  Patient: Gregory [name]  Procedure(s) Performed: TRANSESOPHAGEAL ECHOCARDIOGRAM (TEE)  Patient Location: Endoscopy Unit  Anesthesia Type:MAC  Level of Consciousness: awake, alert  and oriented  Airway & Oxygen Therapy: Patient connected to nasal cannula oxygen  Post-op Assessment: Post -op Vital signs reviewed and stable  Post vital signs: stable  Last Vitals:  Vitals Value Taken Time  BP    Temp    Pulse    Resp    SpO2      Last Pain:  Vitals:   10/10/20 1147  TempSrc: Temporal  PainSc: 0-No pain         Complications: No notable events documented.

## 2020-10-10 NOTE — Progress Notes (Signed)
  Echocardiogram Echocardiogram Transesophageal has been performed.  Bobbye Charleston 10/10/2020, 1:36 PM

## 2020-10-10 NOTE — Progress Notes (Signed)
Dr. Cyndia Bent talking w/patient

## 2020-10-10 NOTE — Progress Notes (Signed)
After continuing to hold pressure for 10 minutes 3 separate times, hematoma continues to return. MD Margaretann Loveless and PA [facility] assessed patient. Orders given to admit patient for further observation. Orders followed and bed request placed.

## 2020-10-10 NOTE — Progress Notes (Signed)
   Notified about enlarging hematoma at left groin site. Went to see patient with Dr. Margaretann Loveless. Bandage at left femoral cath site is saturated with blood which RN states started when the hematoma started to form. However, no active bleeding. Left groin site soft at this time with really no ecchymosis. Strong femoral pulses bilaterally. No bruit appreciated. However, RN states that she just finished holding pressuring. She states that she has held pressure 3 separate times. Each time, she states the hematoma would initially soft and then return and get very hard. After discussing with Dr. Margaretann Loveless, patient, and wife, decision was made to keep overnight out of an abundance of caution in case cath site continues to ooze overnight and hematoma returns. Right radial cath site soft with no signs of hematoma. Patient resting comfortably in no acute distress. Vital stable. Will check labs in the morning to ensure hemoglobin is stable.  Darreld Mclean, PA-C 10/10/2020 8:37 PM

## 2020-10-11 ENCOUNTER — Encounter: Payer: Self-pay | Admitting: Physician Assistant

## 2020-10-11 ENCOUNTER — Encounter (HOSPITAL_COMMUNITY): Payer: Self-pay | Admitting: Internal Medicine

## 2020-10-11 DIAGNOSIS — I2510 Atherosclerotic heart disease of native coronary artery without angina pectoris: Secondary | ICD-10-CM | POA: Diagnosis not present

## 2020-10-11 DIAGNOSIS — I2582 Chronic total occlusion of coronary artery: Secondary | ICD-10-CM | POA: Diagnosis not present

## 2020-10-11 DIAGNOSIS — Z951 Presence of aortocoronary bypass graft: Secondary | ICD-10-CM | POA: Diagnosis not present

## 2020-10-11 DIAGNOSIS — I441 Atrioventricular block, second degree: Secondary | ICD-10-CM | POA: Diagnosis not present

## 2020-10-11 DIAGNOSIS — I5040 Unspecified combined systolic (congestive) and diastolic (congestive) heart failure: Secondary | ICD-10-CM | POA: Diagnosis not present

## 2020-10-11 DIAGNOSIS — I110 Hypertensive heart disease with heart failure: Secondary | ICD-10-CM | POA: Diagnosis not present

## 2020-10-11 DIAGNOSIS — T8203XA Leakage of heart valve prosthesis, initial encounter: Secondary | ICD-10-CM | POA: Diagnosis not present

## 2020-10-11 DIAGNOSIS — I351 Nonrheumatic aortic (valve) insufficiency: Secondary | ICD-10-CM | POA: Diagnosis not present

## 2020-10-11 DIAGNOSIS — Z888 Allergy status to other drugs, medicaments and biological substances status: Secondary | ICD-10-CM | POA: Diagnosis not present

## 2020-10-11 DIAGNOSIS — Y839 Surgical procedure, unspecified as the cause of abnormal reaction of the patient, or of later complication, without mention of misadventure at the time of the procedure: Secondary | ICD-10-CM | POA: Diagnosis not present

## 2020-10-11 LAB — CBC
HCT: 30.7 % — ABNORMAL LOW (ref 39.0–52.0)
Hemoglobin: 9.2 g/dL — ABNORMAL LOW (ref 13.0–17.0)
MCH: 28.1 pg (ref 26.0–34.0)
MCHC: 30 g/dL (ref 30.0–36.0)
MCV: 93.9 fL (ref 80.0–100.0)
Platelets: 166 10*3/uL (ref 150–400)
RBC: 3.27 MIL/uL — ABNORMAL LOW (ref 4.22–5.81)
RDW: 18.6 % — ABNORMAL HIGH (ref 11.5–15.5)
WBC: 6.7 10*3/uL (ref 4.0–10.5)
nRBC: 0 % (ref 0.0–0.2)

## 2020-10-11 LAB — BASIC METABOLIC PANEL
Anion gap: 6 (ref 5–15)
BUN: 22 mg/dL (ref 8–23)
CO2: 24 mmol/L (ref 22–32)
Calcium: 8.5 mg/dL — ABNORMAL LOW (ref 8.9–10.3)
Chloride: 108 mmol/L (ref 98–111)
Creatinine, Ser: 0.97 mg/dL (ref 0.61–1.24)
GFR, Estimated: >60 mL/min (ref >60)
Glucose, Bld: 111 mg/dL — ABNORMAL HIGH (ref 70–99)
Potassium: 4.3 mmol/L (ref 3.5–5.1)
Sodium: 138 mmol/L (ref 135–145)

## 2020-10-11 NOTE — Progress Notes (Signed)
10/11/2020 PT TAVR Pre-Assessment  HPI: Pt is a 70 y/o male admitted 10/10 for evaluation of severe paravalvular aortic regurgitation. Pt is s/p TEE and heart cath on 10/10, but developed L groin hematoma so was kept overnight. PMH includes HTN, glaucoma, s/p CABG, and s/p AVR.  Clinical Impression Statement: Pt is a 70 y.o.male being assessed for pre-TAVR.  Pt reports symptoms of SOB with activity.  Pt has 5/5 strength, normal ROM, and good balance.  Pt ambulated 849.23ft during the 6 minute walk test requiring 0 rest breaks with max HR of 83, lowest O2 sat 93, BP 136/65 during mobility.  5 meter walk test produced an average gait speed of 3.68 which indicates low fall risk.  RPE was 4 and dyspnea was 13 during mobility.  Pt was limited by fatigue and SOB.  Pt's frailty rating was 3 which is considered managing well.  Pt would not benefit from continued PT in the acute care setting due to the above listed deficits in balance, strength, ROM, endurance and activity tolerance.    General UE/LE Strength and ROM:  Strength (0-5/5) ROM (limited/full)  R UE 5/5 full  L UE 5/5 full  R LE 5/5 full  L LE 5/5 full    6 Minute Walk Test:   Total Distance Walked:849.8 ft.    Did the pt need a rest break? No If yes, why? Pain:No; Fatigue:No; Dyspnea/O2 saturations: No Comments: Pt tolerated well with only mild symptoms of SOB. Used RW throughout duration of test    Pre-Test Post-Test  BP 114/73 136/65  HR 77 67  O2 saturations (indicated RA or L/min [place]) 97 98  Modified Borg Dyspnea Scale (0 none-10 maximal) 0 4  RPE (6 very light-10 very hard) 6 13  Comments: Pt tolerated well.   5 Meter Walk Test:  Trial 1 4.98 seconds  Trial 2 4.33 seconds  Trial 3 4.06 seconds  3 Trial Average/Gait Speed 4.46 seconds/3.68 ft/sec (<1.8 ft/sec indicates high fall risk)  Comments: Used RW during testing, but tolerated well.   Clinical Frailty Scale (1 very fit - 9 terminally ill): 3 (>/= 3/9 is considered  frail)   Reuel Derby, PT, DPT  Acute Rehabilitation Services  Pager: [phone] Office: [phone]

## 2020-10-11 NOTE — Evaluation (Signed)
Physical Therapy Evaluation and Discharge Patient Details Name: Gregory [name] MRN: [id number] DOB: October 25, 1950 Today's Date: 10/11/2020  History of Present Illness  Pt is a 70 y/o male admitted 10/10 for evaluation of severe paravalvular aortic regurgitation. Pt is s/p TEE and heart cath on 10/10, but developed L groin hematoma so was kept overnight. PMH includes HTN, glaucoma, s/p CABG, and s/p AVR.  Clinical Impression  Patient evaluated by Physical Therapy with no further acute PT needs identified. All education has been completed and the patient has no further questions. Pt overall steady with use of RW. Able to perform pre-tavr assessment without difficulty. Overall at a mod I level. Pt reports wife can assist if needed. See below for any follow-up Physical Therapy or equipment needs. PT is signing off. Thank you for this referral. If needs change, please re-consult.         Recommendations for follow up therapy are one component of a multi-disciplinary discharge planning process, led by the attending physician.  Recommendations may be updated based on patient status, additional functional criteria and insurance authorization.  Follow Up Recommendations No PT follow up    Equipment Recommendations  None recommended by PT    Recommendations for Other Services       Precautions / Restrictions Precautions Precautions: Fall Restrictions Weight Bearing Restrictions: No      Mobility  Bed Mobility               General bed mobility comments: Sitting EOB upon entry    Transfers Overall transfer level: Modified independent               General transfer comment: Increased time secondary to back pain  Ambulation/Gait Ambulation/Gait assistance: Modified independent (Device/Increase time) Gait Distance (Feet): 950 Feet Assistive device: Rolling walker (2 wheeled) Gait Pattern/deviations: WFL(Within Functional Limits) Gait velocity: WFL   General Gait Details:  Overall steady when performing 6 min walk test and 5 meter walk test.  Stairs            Wheelchair Mobility    Modified Rankin (Stroke Patients Only)       Balance Overall balance assessment: No apparent balance deficits (not formally assessed)                                           Pertinent Vitals/Pain Pain Assessment: Faces Faces Pain Scale: Hurts even more Pain Location: back Pain Descriptors / Indicators: Aching Pain Intervention(s): Limited activity within patient's tolerance;Monitored during session;Repositioned    Home Living Family/patient expects to be discharged to:: Private residence Living Arrangements: Spouse/significant other Available Help at Discharge: Family;Available 24 hours/day Type of Home: House Home Access: Stairs to enter Entrance Stairs-Rails: Right Entrance Stairs-Number of Steps: 2 Home Layout: One level Home Equipment: Cane - single point;Walker - 4 wheels;Bedside commode      Prior Function Level of Independence: Independent with assistive device(s)         Comments: Uses cane for ambulation     Hand Dominance        Extremity/Trunk Assessment   Upper Extremity Assessment Upper Extremity Assessment: Overall WFL for tasks assessed    Lower Extremity Assessment Lower Extremity Assessment: Overall WFL for tasks assessed    Cervical / Trunk Assessment Cervical / Trunk Assessment: Normal  Communication   Communication: No difficulties  Cognition Arousal/Alertness: Awake/alert Behavior During Therapy: WFL for  tasks assessed/performed Overall Cognitive Status: Within Functional Limits for tasks assessed                                        General Comments      Exercises     Assessment/Plan    PT Assessment Patent does not need any further PT services  PT Problem List         PT Treatment Interventions      PT Goals (Current goals can be found in the Care Plan  section)  Acute Rehab PT Goals Patient Stated Goal: to go home PT Goal Formulation: With patient Time For Goal Achievement: 10/11/20 Potential to Achieve Goals: Good    Frequency     Barriers to discharge        Co-evaluation               AM-PAC PT "6 Clicks" Mobility  Outcome Measure Help needed turning from your back to your side while in a flat bed without using bedrails?: None Help needed moving from lying on your back to sitting on the side of a flat bed without using bedrails?: None Help needed moving to and from a bed to a chair (including a wheelchair)?: None Help needed standing up from a chair using your arms (e.g., wheelchair or bedside chair)?: None Help needed to walk in hospital room?: None Help needed climbing 3-5 steps with a railing? : None 6 Click Score: 24    End of Session Equipment Utilized During Treatment: Gait belt Activity Tolerance: Patient tolerated treatment well Patient left: in bed;with call bell/phone within reach (sitting EOB) Nurse Communication: Mobility status PT Visit Diagnosis: Other abnormalities of gait and mobility (R26.89)    Time: 6295-2841 PT Time Calculation (min) (ACUTE ONLY): 36 min   Charges:   PT Evaluation $PT Eval Low Complexity: 1 Low PT Treatments $Gait Training: 8-22 mins        Lou Miner, DPT  Acute Rehabilitation Services  Pager: [phone] Office: [phone]   [place] 10/11/2020, 10:00 AM

## 2020-10-11 NOTE — Progress Notes (Signed)
Surgery instructions written to pt

## 2020-10-11 NOTE — Discharge Summary (Addendum)
Discharge Summary    Patient ID: Gregory [name] MRN: [id number]; DOB: 1950/04/17  Admit date: 10/10/2020 Discharge date: 10/11/2020  PCP:  Jinny Sanders, MD   [facility] Providers Cardiologist:  [name], MD  Cardiology APP:  Lendon Colonel, NP  {  Discharge Diagnoses    Principal Problem:   CHF (congestive heart failure) ([place]) Active Problems:   Paravalvular leak (prosthetic valve)   Severe aortic insufficiency    Diagnostic Studies/Procedures    Cath: 10/10/20  Conclusions: Severe native two-vessel coronary artery disease with chronic total occlusions of the mid LAD and mid RCA.  LCx has 50% proximal stenosis. Widely patent LIMA-LAD and RIMA Y-graft to RPL with anastomosed free radial to RPDA. Normal left and right heart filling pressures. Normal cardiac output. Moderate-severe aortic regurgitation/paravalvular leak, better assessed on today's transesophageal echocardiogram. Dilated thoracic aorta.   Recommendations: Ongoing work-up of aortic valve disease per Drs. Naithan Delage and [place]. Maintain net even fluid balance. Aggressive secondary prevention of coronary artery disease.   Nelva Bush, MD [facility]  Diagnostic Dominance: Right  _____________   History of Present Illness/Hospital course    JAVOHN BASEY is a 70 y.o. male with a hx of CAD and aortic valve disease s/p CABG/AVR 03/06/2019 with a 25 mm [name] Intuity rapid deployment valve and CABG x 3 with a left radial to PDA, LIMA-LAD, and pedicled RIMA-RPL by Dr. 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   The patient underwent CABG/AVR 03/06/2019 with a 25 mm [name] Intuity rapid deployment valve and CABG x 3 with a left radial to PDA, LIMA-LAD, and pedicled RIMA - RPL. The patient's  postoperative course was complicated by heparin-induced thrombocytopenia.  He was ultimately treated with several months of warfarin and recovered uneventfully.   He was in his usual state of health until the last several months when he developed progressive fatigue, exertional dyspnea, fatigue, palpitations as well as anemia.    He was admitted in 09/2020 for 2-1 heart block discovered on an outpatient monitor as well as symptoms of volume overload. During his admission he was diuresed with IV lasix and seen by electrophysiology. Repeat echo showed EF 40-45%, LV dilation, mild LVH, mild aortic root dilatation (4.96mm) and moderate ascending aorta dilation (10mm) as well as severe paravalvular regurgitation in 2 different areas around the sewing ring of his bovine bioprosthesis. Gated cardiac CT done during this admission showed a 25 mm [name] Intuity bioprosthetic valve was present in the aortic position. The leaflets of the prosthesis were freely mobile without evidence of leaflet thickening or thrombosis. There are 2 areas of paravalvular leak under the RCC and LCC related. The leak under the RCC measures ~3.7 mm x ~8.9 mm. The leak under the [place] is ~3.8 mm x 9.1 mm. The leak under then [facility] is likely related to heavy annular calcifications under the [place]. 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 Sinotubular Junction: 41 mm (double oblique). Ascending Thoracic Aorta: 41 mm (double oblique). Severely dilated left ventricular cavity. CTA of the aorta and iliac vessels demonstrate what appear to be adequate pelvic vascular access to facilitate a transfemoral approach.     The patient was feeling better after diuresis and was ultimately discharged home. Permanent pacing was not recommended given possible need for redo surgery.  He was seen in the office by Dr. Burt Knack on 10/06/20. Dr. Burt Knack felt he needed to undergo further evaluation with a transesophageal echo study to further assess the  mechanism of his paravalvular regurgitation.  It appeared that there was a gap around both the left coronary cusp and the right coronary cusp of the aortic valve annulus causing to separate leaks, cumulatively resulting in a large leak.  Treatment options as follows: If we treat him percutaneously, we would consider placement of a temp/perm pacemaker lead followed by balloon valvuloplasty of the surgical sewing ring in order to better expand the surgical prosthesis and potentially seal the paravalvular leaks.  Placement of the temp/perm pacemaker would be considered because the likelihood of complete AV block and pacemaker dependence would be exceedingly high after this procedure.  It is possible that following balloon valvuloplasty, the patient could need valve in valve TAVR if there is leaflet damage or central regurgitation.  It is also possible that he would need paravalvular leak closure if the balloon valvuloplasty procedure is unsuccessful.  After his valve procedure is completed, he would require permanent pacemaker placement. He also recommended a L/RCH to assess his coronary anatomy and bypass grafts as well as hemodynamics. Of note, NO HEPARIN PRODUCTS TO BE USED GIVEN HISTORY OF HIT.   Most recent labs showed a creat of 1.23, GFR 62, K 5.2, Na 141, Hg 10.2, Haptoglobin <10 and LDH 939. TEE and L/RHC were set up for 10/10   TEE showed EF 30-35%, mild RV enlargement/dysfunction, severe AI with severe peri annular regurgitation seen at least 3 areas around right and left sinus. Poor stent skirt appostion below annulus. Also, appears to be limited motion of one of the leaflets. Elevated transgastric gradient mean 39 peak 86 mmHg likely reprsents combination of leaflet dysfunction and severe AR. Estimated AVA 1.64 cm2. Mild to moderate aortic valve stenosis. Of note the patient had bronchospasm and low sats throughout case.   L/RHC showed 3V native CAD with 3/3 patent bypass grafts with normal wedge and  right heart pressures as well as normal cardiac output. Angiomax was used in substitution of heparin.    He was seen by Dr. Cyndia Bent post cath with recommendations for balloon dilation of the Intuiy valve to achieve better apposition of the stent cuff to the annulus. Details were discussed with the patient and wife per Dr. Cyndia Bent and he is planned for surgery 10/18.   Initial plan was to discharge home but he developed left groin hematoma post cath with oozing. Required manual pressure to reduce hematoma several times. He was observed overnight given this and no recurrent issues noted. Hgb 10.5>>9.2 the following day but groin site stable. Able to ambulate without difficulty prior to discharge. Pre op instructions relayed to the patient by structural team prior to discharge.   General: Well developed, well nourished, male appearing in no acute distress. Head: Normocephalic, atraumatic.  Neck: Supple without bruits, JVD. Lungs:  Resp regular and unlabored, CTA. Heart: RRR, S1, S2, no S3, S4, systolic/diastolic murmur; no rub. Abdomen: Soft, non-tender, non-distended with normoactive bowel sounds. Extremities: No clubbing, cyanosis, edema. Distal pedal pulses are 2+ bilaterally. Left femoral cath site stable without bruising or hematoma Neuro: Alert and oriented X 3. Moves all extremities spontaneously. Psych: Normal affect.  Patient see by Dr. Angelena Form and deemed stable for discharge home.   Did the patient have an acute coronary syndrome (MI, NSTEMI, STEMI, etc) this admission?:  No  Did the patient have a percutaneous coronary intervention (stent / angioplasty)?:  No.       _____________  Discharge Vitals Blood pressure 108/63, pulse 69, temperature 98.1 F (36.7 C), temperature source Oral, resp. rate 17, height 5\' 7"  (1.702 m), weight 99 kg, SpO2 95 %.  Filed Weights   10/10/20 1147 10/10/20 2115  Weight: 93.4 kg 99 kg    Labs & Radiologic Studies     CBC Recent Labs    10/10/20 1451 10/11/20 0136  WBC  --  6.7  HGB 10.5* 9.2*  HCT 31.0* 30.7*  MCV  --  93.9  PLT  --  382   Basic Metabolic Panel Recent Labs    10/10/20 1451 10/11/20 0136  NA 138 138  K 4.7 4.3  CL  --  108  CO2  --  24  GLUCOSE  --  111*  BUN  --  22  CREATININE  --  0.97  CALCIUM  --  8.5*   Liver Function Tests No results for input(s): AST, ALT, ALKPHOS, BILITOT, PROT, ALBUMIN in the last 72 hours. No results for input(s): LIPASE, AMYLASE in the last 72 hours. High Sensitivity Troponin:   No results for input(s): TROPONINIHS in the last 720 hours.  BNP Invalid input(s): POCBNP D-Dimer No results for input(s): DDIMER in the last 72 hours. Hemoglobin A1C No results for input(s): HGBA1C in the last 72 hours. Fasting Lipid Panel No results for input(s): CHOL, HDL, LDLCALC, TRIG, CHOLHDL, LDLDIRECT in the last 72 hours. Thyroid Function Tests No results for input(s): TSH, T4TOTAL, T3FREE, THYROIDAB in the last 72 hours.  Invalid input(s): FREET3 _____________  DG Chest 2 View  Result Date: 09/29/2020 CLINICAL DATA:  Shortness of breath EXAM: CHEST - 2 VIEW COMPARISON:  Chest x-ray dated March 16, 2019 FINDINGS: Increased cardiac contours when compared with 2021 prior exams. Bibasilar opacities, likely atelectasis. No evidence of pleural effusion or pneumothorax. IMPRESSION: Increased cardiac contours when compared with 2021 prior exams. Correlate with echocardiography. Bibasilar atelectasis.  Lungs otherwise clear. Electronically Signed   By: Yetta Glassman M.D.   On: 09/29/2020 08:18   CARDIAC CATHETERIZATION  Result Date: 10/10/2020 Conclusions: Severe native two-vessel coronary artery disease with chronic total occlusions of the mid LAD and mid RCA.  LCx has 50% proximal stenosis. Widely patent LIMA-LAD and RIMA Y-graft to RPL with anastomosed free radial to RPDA. Normal left and right heart filling pressures. Normal cardiac output.  Moderate-severe aortic regurgitation/paravalvular leak, better assessed on today's transesophageal echocardiogram. Dilated thoracic aorta. Recommendations: Ongoing work-up of aortic valve disease per Drs. Darielle Hancher and [place]. Maintain net even fluid balance. Aggressive secondary prevention of coronary artery disease. Nelva Bush, MD [facility]   PERIPHERAL VASCULAR CATHETERIZATION  Result Date: 10/10/2020 Conclusions: Severe native two-vessel coronary artery disease with chronic total occlusions of the mid LAD and mid RCA.  LCx has 50% proximal stenosis. Widely patent LIMA-LAD and RIMA Y-graft to RPL with anastomosed free radial to RPDA. Normal left and right heart filling pressures. Normal cardiac output. Moderate-severe aortic regurgitation/paravalvular leak, better assessed on today's transesophageal echocardiogram. Dilated thoracic aorta. Recommendations: Ongoing work-up of aortic valve disease per Drs. Joshiah Traynham and [place]. Maintain net even fluid balance. Aggressive secondary prevention of coronary artery disease. Nelva Bush, MD [facility]   CT CORONARY [facility] W/CTA COR W/SCORE Lewanda Rife W/CM &/OR WO/CM  Addendum Date: 09/30/2020   ADDENDUM REPORT: 09/30/2020 20:00 CLINICAL DATA:  Prosthetic valve regurgitation. 25 mm [name] Intuity bioprosthetic valve with severe regurgitation.  EXAM: Cardiac TAVR CT TECHNIQUE: A non-contrast, gated CT scan was obtained with axial slices of 3 mm through the heart for aortic valve calcium scoring. A 120 kV retrospective, gated, contrast cardiac scan was obtained. Gantry rotation speed was 250 msecs and collimation was 0.6 mm. Nitroglycerin was not given. The 3D data set was reconstructed in 5% intervals of the 0-95% of the R-R cycle. Systolic and diastolic phases were analyzed on a dedicated workstation using MPR, MIP, and VRT modes. The patient received 100 cc of contrast. FINDINGS: Image quality: Excellent. Noise artifact is: Limited. Aortic Prosthesis: A 25  mm [name] Intuity bioprosthetic valve is present in the aortic position. The leaflets of the prosthesis are freely mobile without evidence of leaflet thickening or thrombosis. There are 2 areas of paravalvular leak under the RCC and LCC related. The leak under the RCC measures ~3.7 mm x ~8.9 mm. The leak under the [place] is ~3.8 mm x 9.1 mm. The leak under then [facility] is likely related to heavy annular calcifications under the [place]. 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 Sinotubular Junction: 41 mm (double oblique). Ascending Thoracic Aorta: 41 mm (double oblique) Coronary Arteries: Normal coronary origin. Right dominance. The study was performed without use of NTG and is insufficient for plaque evaluation. A LIMA to the LAD is seen. A RIMA to the RCA is seen. 3-vessel coronary calcifications are seen in the native coronaries. Right Atrium: Right atrial size is dilated. Right Ventricle: The right ventricular cavity is dilated. Left Atrium: Left atrial size is normal in size with no left atrial appendage filling defect. Left Ventricle: The ventricular cavity size is severely dilated however, the entire left ventricle was not included in the FOV. Pulmonary arteries: Dilated pulmonary artery suggestive of pulmonary hypertension. Pulmonary veins: Normal pulmonary venous drainage. Pericardium: Normal thickness with no significant effusion or calcium present. Mitral Valve: The mitral valve is normal structure without significant calcification. Extra-cardiac findings: See attached radiology report for non-cardiac structures. IMPRESSION: 1. 25 mm [name] Intuity bioprosthetic valve with 2 identified paravalvular leaks under the RCC and LCC as detailed above. 2. 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 3. S/p CABG.  LIMA-LAD and RIMA-RCA are seen. 4. Severely dilated left ventricular cavity. 5. Dilated pulmonary artery suggestive of pulmonary hypertension. [place] T. Audie Box, MD Electronically Signed   By:  Eleonore Chiquito M.D.   On: 09/30/2020 20:00   Result Date: 09/30/2020 EXAM: OVER-READ INTERPRETATION  CT CHEST The following report is an over-read performed by radiologist Dr. Vinnie Langton of [facility], [place] on 09/30/2020. This over-read does not include interpretation of cardiac or coronary anatomy or pathology. The coronary calcium score/coronary CTA interpretation by the cardiologist is attached. COMPARISON:  Chest CT 03/05/2019. FINDINGS: Extracardiac findings will be described separately under dictation for contemporaneously obtained CTA chest, abdomen and pelvis. IMPRESSION: Please see separate dictation for contemporaneously obtained CTA chest, abdomen and pelvis dated 09/30/2020 for full description of relevant extracardiac findings. Electronically Signed: By: Vinnie Langton M.D. On: 09/30/2020 13:45   CT ANGIO CHEST AORTA W/CM & OR WO/CM  Result Date: 09/30/2020 CLINICAL DATA:  70 year old male with history of severe aortic stenosis. Preprocedural study prior to potential transcatheter aortic valve replacement (TAVR) procedure. EXAM: CT ANGIOGRAPHY CHEST, ABDOMEN AND PELVIS TECHNIQUE: Multidetector CT imaging through the chest, abdomen and pelvis was performed using the standard protocol during bolus administration of intravenous contrast. Multiplanar reconstructed images and MIPs were obtained and reviewed to evaluate the vascular anatomy.  CONTRAST:  126mL OMNIPAQUE IOHEXOL 350 MG/ML SOLN COMPARISON:  Chest CT 03/05/2019. FINDINGS: CTA CHEST FINDINGS Cardiovascular: Heart size is mildly enlarged. There is no significant pericardial fluid, thickening or pericardial calcification. There is aortic atherosclerosis, as well as atherosclerosis of the great vessels of the mediastinum and the coronary arteries, including calcified atherosclerotic plaque in the left main, left anterior descending, left circumflex and right coronary arteries. Status post median sternotomy for CABG including LIMA  to the LAD. Patient is also status post aortic valve replacement with what appears to be a stented bioprosthesis. 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 Mild aneurysmal dilatation of the ascending thoracic aorta (4.5 cm in diameter). Mediastinum/Lymph Nodes: No pathologically enlarged mediastinal or hilar lymph nodes. Esophagus is unremarkable in appearance. No axillary lymphadenopathy. Lungs/Pleura: No suspicious appearing pulmonary nodules or masses are noted. No acute consolidative airspace disease. Small bilateral pleural effusions lying dependently. Musculoskeletal/Soft Tissues: There are no aggressive appearing lytic or blastic lesions noted in the visualized portions of the skeleton. CTA ABDOMEN AND PELVIS FINDINGS Hepatobiliary: No suspicious cystic or solid hepatic lesions. No intra or extrahepatic biliary ductal dilatation. Tiny calcified gallstone lying dependently in the gallbladder. No findings to suggest an acute cholecystitis at this time. Pancreas: No pancreatic mass. No pancreatic ductal dilatation. No pancreatic or peripancreatic fluid collections or inflammatory changes. Spleen: Unremarkable. Adrenals/Urinary Tract: 1.4 cm low-attenuation lesion in the interpolar region of the left kidney is compatible with a simple cyst. Mild bilateral renal atrophy. Bilateral perinephric stranding (nonspecific). Right kidney and bilateral adrenal glands are otherwise unremarkable in appearance. No hydroureteronephrosis. Urinary bladder is normal in appearance. Stomach/Bowel: The appearance of the stomach is normal. No pathologic dilatation of small bowel or colon. Numerous colonic diverticulae are noted, without definite surrounding inflammatory changes to clearly indicate an acute diverticulitis at this time. Normal appendix. Vascular/Lymphatic: Aortic atherosclerosis, without evidence of aneurysm or dissection in the abdominal or pelvic vasculature. No lymphadenopathy noted in  the abdomen or pelvis. Reproductive: Prostate gland and seminal vesicles are unremarkable in appearance. Other: No significant volume of ascites.  No pneumoperitoneum. Musculoskeletal: There are no aggressive appearing lytic or blastic lesions noted in the visualized portions of the skeleton. VASCULAR MEASUREMENTS PERTINENT TO TAVR: AORTA: Minimal Aortic Diameter-22 x 19 mm Severity of Aortic Calcification-moderate RIGHT PELVIS: Right Common Iliac Artery - Minimal Diameter-11.8 x 13.3 mm Tortuosity-mild Calcification-moderate to severe Right External Iliac Artery - Minimal Diameter-10.9 x 11.3 mm Tortuosity-severe Calcification-none Right Common Femoral Artery - Minimal Diameter-11.9 x 10.9 mm Tortuosity-mild Calcification-mild LEFT PELVIS: Left Common Iliac Artery - Minimal Diameter-14.8 x 13.9 mm Tortuosity-mild Calcification-moderate to severe Left External Iliac Artery - Minimal Diameter-11.2 x 12.0 mm Tortuosity-moderate Calcification-none Left Common Femoral Artery - Minimal Diameter-10.7 x 10.9 mm Tortuosity-mild Calcification-mild Review of the MIP images confirms the above findings. IMPRESSION: 1. Vascular findings and measurements pertinent to potential TAVR procedure, as detailed above. 2. Status post aortic valve replacement with what appears to be a stented bioprosthesis. 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 Ascending thoracic aortic aneurysm. Recommend semi-annual imaging followup by CTA or MRA and referral to cardiothoracic surgery if not already obtained. This recommendation follows 2010 ACCF/AHA/AATS/ACR/ASA/SCA/SCAI/SIR/STS/SVM Guidelines for the Diagnosis and Management of Patients With Thoracic Aortic Disease. Circulation. 2010; 121: R154-M086. Aortic aneurysm NOS (ICD10-I71.9). 3. Aortic atherosclerosis, in addition to left main and 3 vessel coronary artery disease. Status post median sternotomy for CABG  including LIMA to the LAD. 4. Mild cardiomegaly. 5. Small bilateral pleural effusions lying dependently. 6. Colonic diverticulosis without evidence of acute diverticulitis at this time. 7. Additional incidental findings, as above. Electronically Signed   By: Vinnie Langton M.D.   On: 09/30/2020 14:11   ECHOCARDIOGRAM COMPLETE  Result Date: 09/29/2020    ECHOCARDIOGRAM REPORT   Patient Name:   Gregory [name] Date of Exam: 09/29/2020 Medical Rec #:  [id number]         Height:       66.0 in Accession #:    [id number]        Weight:       226.0 lb Date of Birth:  11-Nov-1950         BSA:          2.106 m Patient Age:    15 years          BP:           160/59 mmHg Patient Gender: M                 HR:           41 bpm. Exam Location:  Inpatient Procedure: 2D Echo, Cardiac Doppler and Color Doppler Indications:    Dyspnea R06.00  History:        Patient has prior history of Echocardiogram examinations, most                 recent 12/07/2019. CAD, Aortic Valve Disease, Arrythmias:LBBB and                 Complete Heart Block, Signs/Symptoms:Shortness of Breath; Risk                 Factors:Hypertension, Dyslipidemia and Sleep Apnea.                 Aortic Valve: 25 mm INTUITY valve is present in the aortic                 position. Procedure Date: 03/06/2019.  Sonographer:    Darlina Sicilian RDCS Referring Phys: [id number] [place]  1. The aortic valve has been Intuity 25 mm valve in the aortic position. There are two peri-valvular jets that are moderate in severity; in summation suggestive of severe regurgitation. There is a 25 mm INTUITY valve present in the aortic position. Procedure Date: 03/06/2019.  2. Left ventricular ejection fraction, by estimation, is 40 to 45%. The left ventricle has mildly decreased function. The left ventricle demonstrates global hypokinesis. The left ventricular internal cavity size was moderately dilated. There is mild concentric left ventricular hypertrophy. Left  ventricular diastolic parameters are indeterminate.  3. Right ventricular systolic function is normal. The right ventricular size is normal. There is normal pulmonary artery systolic pressure.  4. Left atrial size was severely dilated.  5. The mitral valve is normal in structure. No evidence of mitral valve regurgitation.  6. Aortic dilatation noted. There is mild dilatation of the aortic root, measuring 44 mm. There is moderate dilatation of the ascending aorta, measuring 45 mm. Comparison(s): A prior study was performed on 12/07/2019. LV function is slightly worse, LV dilation has dilated. Comparison of the apical 5 chamber view and PSAX view (which does not show both jets) shows worse regurgitation despite being done at a lower  Nyquist limit. In comparison to post operative TEE, perivalvular leak which was present psot op has worsened. Conclusion(s)/Recommendation(s): TEE or cardiac MRI could be considered  for further quantification of valve dysfunction. FINDINGS  Left Ventricle: Left ventricular ejection fraction, by estimation, is 40 to 45%. The left ventricle has mildly decreased function. The left ventricle demonstrates global hypokinesis. The left ventricular internal cavity size was moderately dilated. There is mild concentric left ventricular hypertrophy. Abnormal (paradoxical) septal motion, consistent with left bundle branch block. Left ventricular diastolic parameters are indeterminate. Right Ventricle: The right ventricular size is normal. Right vetricular wall thickness was not well visualized. Right ventricular systolic function is normal. There is normal pulmonary artery systolic pressure. The tricuspid regurgitant velocity is 2.62 m/s, and with an assumed right atrial pressure of 3 mmHg, the estimated right ventricular systolic pressure is 16.1 mmHg. Left Atrium: Left atrial size was severely dilated. Right Atrium: Right atrial size was normal in size. Pericardium: There is no evidence of  pericardial effusion. Mitral Valve: The mitral valve is normal in structure. No evidence of mitral valve regurgitation. Tricuspid Valve: The tricuspid valve is normal in structure. Tricuspid valve regurgitation is trivial. Aortic Valve: The aortic valve has been repaired/replaced. Aortic valve regurgitation is severe. Aortic regurgitation PHT measures 691 msec. Aortic valve mean gradient measures 19.0 mmHg. Aortic valve peak gradient measures 42.0 mmHg. Aortic valve area, by VTI measures 1.70 cm. There is a 25 mm INTUITY valve present in the aortic position. Procedure Date: 03/06/2019. Pulmonic Valve: The pulmonic valve was not well visualized. Pulmonic valve regurgitation is trivial. No evidence of pulmonic stenosis. Aorta: Aortic dilatation noted. There is mild dilatation of the aortic root, measuring 44 mm. There is moderate dilatation of the ascending aorta, measuring 45 mm. IAS/Shunts: The atrial septum is grossly normal.  LEFT VENTRICLE PLAX 2D LVIDd:         6.70 cm      Diastology LVIDs:         5.30 cm      LV e' medial:    6.57 cm/s LV PW:         1.30 cm      LV E/e' medial:  17.5 LV IVS:        1.50 cm      LV e' lateral:   9.02 cm/s LVOT diam:     2.60 cm      LV E/e' lateral: 12.8 LV SV:         98 LV SV Index:   47 LVOT Area:     5.31 cm  LV Volumes (MOD) LV vol d, MOD A2C: 390.0 ml LV vol d, MOD A4C: 355.0 ml LV vol s, MOD A2C: 181.0 ml LV vol s, MOD A4C: 201.0 ml LV SV MOD A2C:     209.0 ml LV SV MOD A4C:     355.0 ml LV SV MOD BP:      180.5 ml RIGHT VENTRICLE RV S prime:     8.66 cm/s TAPSE (M-mode): 1.5 cm LEFT ATRIUM              Index       RIGHT ATRIUM           Index LA diam:        5.40 cm  2.56 cm/m  RA Area:     15.10 cm LA Vol (A2C):   128.0 ml 60.77 ml/m RA Volume:   32.90 ml  15.62 ml/m LA Vol (A4C):   139.0 ml 65.99 ml/m LA Biplane Vol: 136.0 ml 64.57 ml/m  AORTIC VALVE AV Area (Vmax):    1.35 cm AV Area (Vmean):  1.57 cm AV Area (VTI):     1.70 cm AV Vmax:           324.00  cm/s AV Vmean:          183.200 cm/s AV VTI:            0.580 m AV Peak Grad:      42.0 mmHg AV Mean Grad:      19.0 mmHg LVOT Vmax:         82.50 cm/s LVOT Vmean:        54.250 cm/s LVOT VTI:          0.186 m LVOT/AV VTI ratio: 0.32 AI PHT:            691 msec  AORTA Ao Root diam: 4.40 cm Ao Asc diam:  4.55 cm MITRAL VALVE                TRICUSPID VALVE MV Area (PHT): 3.92 cm     TR Peak grad:   27.5 mmHg MV Decel Time: 194 msec     TR Vmax:        262.00 cm/s MV E velocity: 115.08 cm/s                             SHUNTS                             Systemic VTI:  0.19 m                             Systemic Diam: 2.60 cm Rudean Haskell MD Electronically signed by Rudean Haskell MD Signature Date/Time: 09/29/2020/2:43:41 PM    Final    ECHO TEE  Result Date: 10/10/2020    TRANSESOPHOGEAL ECHO REPORT   Patient Name:   Gregory [name] Date of Exam: 10/10/2020 Medical Rec #:  [id number]         Height:       67.0 in Accession #:    [id number]        Weight:       217.4 lb Date of Birth:  [birth year]         BSA:          2.095 m Patient Age:    70 years          BP:           122/65 mmHg Patient Gender: M                 HR:           80 bpm. Exam Location:  Inpatient Procedure: 3D Echo, Transesophageal Echo, Cardiac Doppler and Color Doppler Indications:     I35.1 Nonrheumatic aortic (valve) insufficiency  History:         Patient has prior history of Echocardiogram examinations. CHF                  and Cardiomyopathy, Previous Myocardial Infarction, Prior CABG                  and Abnormal ECG, Aortic Valve Disease, Arrythmias:Atrial                  Fibrillation, Signs/Symptoms:Murmur; Risk Factors:Dyslipidemia,                  Hypertension  and Sleep Apnea.                  Aortic Valve: 25 mm Intuity valve is present in the aortic                  position. Procedure Date: 03/06/19.  Sonographer:     Roseanna Rainbow RDCS Referring Phys:  [place] Diagnosing Phys: Jenkins Rouge MD PROCEDURE:  After discussion of the risks and benefits of a TEE, an informed consent was obtained from the patient. The transesophogeal probe was passed without difficulty through the esophogus of the patient. Imaged were obtained with the patient in a left lateral decubitus position. Sedation performed by performing physician. The patient was monitored while under deep sedation. Anesthestetic sedation was provided intravenously by Anesthesiology: 264mg  of Propofol. The patient developed Respiratory depression during the procedure. IMPRESSIONS  1. Exam limited by patient bronchospasm and desats with coughing througout study despite propofol.  2. Left ventricular ejection fraction, by estimation, is 30 to 35%. The left ventricle has normal function. The left ventricle demonstrates global hypokinesis. The left ventricular internal cavity size was severely dilated. Left ventricular diastolic function could not be evaluated.  3. Right ventricular systolic function is mildly reduced. The right ventricular size is mildly enlarged.  4. Left atrial size was moderately dilated. No left atrial/left atrial appendage thrombus was detected.  5. Right atrial size was mildly dilated.  6. The mitral valve is abnormal. Mild mitral valve regurgitation.  7. 25 mm Intuity hybrid valve. Severe peri annular regurgitatiion seen at least 3 areas around right and left sinus. Poor stent skirt appostion below annulus. Also appears to be limited motion of one of the leaflets. Elevated transgastric gradient mean 39 peak 86 mmHg likely reprsents combination of leaflet dysfunction and severe AR Estimted AVA 1.64 cm2 . The aortic valve has been repaired/replaced. Aortic valve regurgitation is severe. Mild to moderate aortic valve stenosis. There is a 25 mm Intuity valve present in the aortic position. Procedure Date: 03/06/19. FINDINGS  Left Ventricle: Left ventricular ejection fraction, by estimation, is 30 to 35%. The left ventricle has normal function. The  left ventricle demonstrates global hypokinesis. The left ventricular internal cavity size was severely dilated. Left ventricular  diastolic function could not be evaluated. Right Ventricle: The right ventricular size is mildly enlarged. Right vetricular wall thickness was not assessed. Right ventricular systolic function is mildly reduced. Left Atrium: Left atrial size was moderately dilated. No left atrial/left atrial appendage thrombus was detected. Right Atrium: Right atrial size was mildly dilated. Pericardium: There is no evidence of pericardial effusion. Mitral Valve: The mitral valve is abnormal. Mild mitral valve regurgitation. Tricuspid Valve: The tricuspid valve is normal in structure. Tricuspid valve regurgitation is mild. Aortic Valve: 25 mm Intuity hybrid valve. Severe peri annular regurgitatiion seen at least 3 areas around right and left sinus. Poor stent skirt appostion below annulus. Also appears to be limited motion of one of the leaflets. Elevated transgastric gradient mean 39 peak 86 mmHg likely reprsents combination of leaflet dysfunction and severe AR Estimted AVA 1.64 cm2. The aortic valve has been repaired/replaced. Aortic valve regurgitation is severe. Mild to moderate aortic stenosis is present. Aortic valve mean gradient measures 39.0 mmHg. Aortic valve peak gradient measures 85.4 mmHg. Aortic valve area, by VTI measures 1.32 cm. There is a 25 mm Intuity valve present in the aortic position. Procedure Date: 03/06/19. Pulmonic Valve: The pulmonic valve was grossly normal. Pulmonic valve regurgitation is mild.  Aorta: The aortic root was not well visualized. IAS/Shunts: No atrial level shunt detected by color flow Doppler. Additional Comments: Exam limited by patient bronchospasm and desats with coughing througout study despite propofol.  LEFT VENTRICLE PLAX 2D LVOT diam:     2.19 cm LV SV:         135 LV SV Index:   64 LVOT Area:     3.77 cm  AORTIC VALVE AV Area (Vmax):    1.64 cm AV Area  (Vmean):   1.77 cm AV Area (VTI):     1.32 cm AV Vmax:           462.00 cm/s AV Vmean:          285.000 cm/s AV VTI:            1.020 m AV Peak Grad:      85.4 mmHg AV Mean Grad:      39.0 mmHg LVOT Vmax:         201.00 cm/s LVOT Vmean:        134.000 cm/s LVOT VTI:          0.357 m LVOT/AV VTI ratio: 0.35  AORTA Ao Asc diam: 3.59 cm  SHUNTS Systemic VTI:  0.36 m Systemic Diam: 2.19 cm Jenkins Rouge MD Electronically signed by Jenkins Rouge MD Signature Date/Time: 10/10/2020/2:51:13 PM    Final    LONG TERM MONITOR (3-14 DAYS)  Result Date: 09/28/2020 Predominant rhythm sinus bradycardia High degree heart block Mobitz Type I and II with probable episodes of complete heart block Junctional rhythm  CT Angio Abd/Pel w/ and/or w/o  Result Date: 09/30/2020 CLINICAL DATA:  70 year old male with history of severe aortic stenosis. Preprocedural study prior to potential transcatheter aortic valve replacement (TAVR) procedure. EXAM: CT ANGIOGRAPHY CHEST, ABDOMEN AND PELVIS TECHNIQUE: Multidetector CT imaging through the chest, abdomen and pelvis was performed using the standard protocol during bolus administration of intravenous contrast. Multiplanar reconstructed images and MIPs were obtained and reviewed to evaluate the vascular anatomy. CONTRAST:  19mL OMNIPAQUE IOHEXOL 350 MG/ML SOLN COMPARISON:  Chest CT 03/05/2019. FINDINGS: CTA CHEST FINDINGS Cardiovascular: Heart size is mildly enlarged. There is no significant pericardial fluid, thickening or pericardial calcification. There is aortic atherosclerosis, as well as atherosclerosis of the great vessels of the mediastinum and the coronary arteries, including calcified atherosclerotic plaque in the left main, left anterior descending, left circumflex and right coronary arteries. Status post median sternotomy for CABG including LIMA to the LAD. Patient is also status post aortic valve replacement with what appears to be a stented bioprosthesis. Aneurysmal  dilatation of the sinuses of Valsalva (5.0 cm in diameter). Mild aneurysmal dilatation of the ascending thoracic aorta (4.5 cm in diameter). Mediastinum/Lymph Nodes: No pathologically enlarged mediastinal or hilar lymph nodes. Esophagus is unremarkable in appearance. No axillary lymphadenopathy. Lungs/Pleura: No suspicious appearing pulmonary nodules or masses are noted. No acute consolidative airspace disease. Small bilateral pleural effusions lying dependently. Musculoskeletal/Soft Tissues: There are no aggressive appearing lytic or blastic lesions noted in the visualized portions of the skeleton. CTA ABDOMEN AND PELVIS FINDINGS Hepatobiliary: No suspicious cystic or solid hepatic lesions. No intra or extrahepatic biliary ductal dilatation. Tiny calcified gallstone lying dependently in the gallbladder. No findings to suggest an acute cholecystitis at this time. Pancreas: No pancreatic mass. No pancreatic ductal dilatation. No pancreatic or peripancreatic fluid collections or inflammatory changes. Spleen: Unremarkable. Adrenals/Urinary Tract: 1.4 cm low-attenuation lesion in the interpolar region of the left kidney is compatible with a simple  cyst. Mild bilateral renal atrophy. Bilateral perinephric stranding (nonspecific). Right kidney and bilateral adrenal glands are otherwise unremarkable in appearance. No hydroureteronephrosis. Urinary bladder is normal in appearance. Stomach/Bowel: The appearance of the stomach is normal. No pathologic dilatation of small bowel or colon. Numerous colonic diverticulae are noted, without definite surrounding inflammatory changes to clearly indicate an acute diverticulitis at this time. Normal appendix. Vascular/Lymphatic: Aortic atherosclerosis, without evidence of aneurysm or dissection in the abdominal or pelvic vasculature. No lymphadenopathy noted in the abdomen or pelvis. Reproductive: Prostate gland and seminal vesicles are unremarkable in appearance. Other: No significant  volume of ascites.  No pneumoperitoneum. Musculoskeletal: There are no aggressive appearing lytic or blastic lesions noted in the visualized portions of the skeleton. VASCULAR MEASUREMENTS PERTINENT TO TAVR: AORTA: Minimal Aortic Diameter-22 x 19 mm Severity of Aortic Calcification-moderate RIGHT PELVIS: Right Common Iliac Artery - Minimal Diameter-11.8 x 13.3 mm Tortuosity-mild Calcification-moderate to severe Right External Iliac Artery - Minimal Diameter-10.9 x 11.3 mm Tortuosity-severe Calcification-none Right Common Femoral Artery - Minimal Diameter-11.9 x 10.9 mm Tortuosity-mild Calcification-mild LEFT PELVIS: Left Common Iliac Artery - Minimal Diameter-14.8 x 13.9 mm Tortuosity-mild Calcification-moderate to severe Left External Iliac Artery - Minimal Diameter-11.2 x 12.0 mm Tortuosity-moderate Calcification-none Left Common Femoral Artery - Minimal Diameter-10.7 x 10.9 mm Tortuosity-mild Calcification-mild Review of the MIP images confirms the above findings. IMPRESSION: 1. Vascular findings and measurements pertinent to potential TAVR procedure, as detailed above. 2. Status post aortic valve replacement with what appears to be a stented bioprosthesis. 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 Ascending thoracic aortic aneurysm. Recommend semi-annual imaging followup by CTA or MRA and referral to cardiothoracic surgery if not already obtained. This recommendation follows 2010 ACCF/AHA/AATS/ACR/ASA/SCA/SCAI/SIR/STS/SVM Guidelines for the Diagnosis and Management of Patients With Thoracic Aortic Disease. Circulation. 2010; 121: P536-R443. Aortic aneurysm NOS (ICD10-I71.9). 3. Aortic atherosclerosis, in addition to left main and 3 vessel coronary artery disease. Status post median sternotomy for CABG including LIMA to the LAD. 4. Mild cardiomegaly. 5. Small bilateral pleural effusions lying dependently. 6. Colonic diverticulosis  without evidence of acute diverticulitis at this time. 7. Additional incidental findings, as above. Electronically Signed   By: Vinnie Langton M.D.   On: 09/30/2020 14:11   Disposition   Pt is being discharged home today in good condition.  Follow-up Plans & Appointments     Follow-up [facility] Follow up on 10/17/2020.   Why: Please follow instructions given regarding upcoming surgery               Discharge Instructions     Call MD for:  difficulty breathing, headache or visual disturbances   Complete by: As directed    Call MD for:  persistant dizziness or light-headedness   Complete by: As directed    Call MD for:  redness, tenderness, or signs of infection (pain, swelling, redness, odor or green/yellow discharge around incision site)   Complete by: As directed    Diet - low sodium heart healthy   Complete by: As directed    Discharge instructions   Complete by: As directed    Groin Site Care Refer to this sheet in the next few weeks. These instructions provide you with information on caring for yourself after your procedure. Your caregiver may also give you more specific instructions. Your treatment has been planned according to current medical practices, but problems sometimes occur. Call your caregiver  if you have any problems or questions after your procedure. HOME CARE INSTRUCTIONS You may shower 24 hours after the procedure. Remove the bandage (dressing) and gently wash the site with plain soap and water. Gently pat the site dry.  Do not apply powder or lotion to the site.  Do not sit in a bathtub, swimming pool, or whirlpool for 5 to 7 days.  No bending, squatting, or lifting anything over 10 pounds (4.5 kg) as directed by your caregiver.  Inspect the site at least twice daily.  Do not drive home if you are discharged the same day of the procedure. Have someone else drive you.  You may drive 24 hours after the procedure unless otherwise  instructed by your caregiver.  What to expect: Any bruising will usually fade within 1 to 2 weeks.  Blood that collects in the tissue (hematoma) may be painful to the touch. It should usually decrease in size and tenderness within 1 to 2 weeks.  SEEK IMMEDIATE MEDICAL CARE IF: You have unusual pain at the groin site or down the affected leg.  You have redness, warmth, swelling, or pain at the groin site.  You have drainage (other than a small amount of blood on the dressing).  You have chills.  You have a fever or persistent symptoms for more than 72 hours.  You have a fever and your symptoms suddenly get worse.  Your leg becomes pale, cool, tingly, or numb.  You have heavy bleeding from the site. Hold pressure on the site.   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   allopurinol 100 MG tablet Commonly known as: ZYLOPRIM Take 1 tablet (100 mg total) by mouth daily.   ALPRAZolam 0.5 MG tablet Commonly known as: XANAX TAKE 1 TABLET BY MOUTH ONCE DAILY AS NEEDED FOR ANXIETY TAKE  30  MINUTES  PRIOR  TO  PLANE  FLIGHT  OR  AS  NEEDED  FOR  ANXIETY   aspirin EC 81 MG tablet Take 1 tablet (81 mg total) by mouth daily. Swallow whole.   atorvastatin 80 MG tablet Commonly known as: LIPITOR Take 1 tablet (80 mg total) by mouth daily.   colchicine 0.6 MG tablet Take 2 tablets by mouth once daily What changed:  how much to take additional instructions   furosemide 20 MG tablet Commonly known as: LASIX Take 2 tablets in the morning and 1 tablet in the afternoon   ketoconazole 2 % shampoo Commonly known as: NIZORAL Apply 1 application topically 2 (two) times a  week. What changed: when to take this   latanoprost 0.005 % ophthalmic solution Commonly known as: XALATAN Place 1 drop into both eyes every morning.   Osteo Bi-Flex/5-Loxin Advanced Tabs Take 2 tablets by mouth daily.   potassium chloride SA 20 MEQ tablet Commonly known as: KLOR-CON Take 1 tablet (20 mEq total) by mouth daily.   sacubitril-valsartan 49-51 MG Commonly known as: ENTRESTO Take 1 tablet by mouth 2 (two) times daily.   sertraline 50 MG tablet Commonly known as: ZOLOFT Take 1  tablet (50 mg total) by mouth daily.   spironolactone 25 MG tablet Commonly known as: ALDACTONE Take 1 tablet (25 mg total) by mouth daily.   traZODone 50 MG tablet Commonly known as: DESYREL Take 0.5-1 tablets (25-50 mg total) by mouth at bedtime as needed for sleep. What changed: how much to take           Outstanding Labs/Studies   N/a   Duration of Discharge Encounter   Greater than 30 minutes including physician time.  Signed, Reino Bellis, NP 10/11/2020, 11:03 AM  Patient seen, examined. Available data reviewed. Agree with findings, assessment, and plan as outlined by Reino Bellis, NP. On exam, he is alert, oriented, NAD.  JVP normal, lungs clear, heart RRR with 2/6 systolic and 2/6 diastolic murmur at the RUSB/LLSB, left groin site clear, extremities without edema. Very complex patient see just last week in my office, kept overnight for post-cath groin oozing. Stable for DC home today. Plan: temp-perm pacemaker Monday, balloon valvuloplasty +/- VIV TAVR Tuesday next week. Patient to return Monday afternoon for temp/perm pacemaker.  Sherren Mocha, M.D. 10/11/2020 3:45 PM

## 2020-10-11 NOTE — Plan of Care (Signed)

## 2020-10-12 MED FILL — Bivalirudin Trifluoroacetate For IV Soln 250 MG (Base Equiv): INTRAVENOUS | Qty: 250 | Status: AC

## 2020-10-13 ENCOUNTER — Encounter (HOSPITAL_COMMUNITY): Payer: Self-pay | Admitting: Cardiovascular Disease

## 2020-10-13 ENCOUNTER — Ambulatory Visit: Payer: Medicare HMO

## 2020-10-14 ENCOUNTER — Other Ambulatory Visit (HOSPITAL_COMMUNITY): Payer: Medicare HMO

## 2020-10-17 ENCOUNTER — Inpatient Hospital Stay (HOSPITAL_COMMUNITY): Payer: Medicare HMO

## 2020-10-17 ENCOUNTER — Other Ambulatory Visit: Payer: Self-pay

## 2020-10-17 ENCOUNTER — Inpatient Hospital Stay (HOSPITAL_COMMUNITY)
Admission: AD | Disposition: A | Discharge status: Home / Self Care | Payer: Self-pay | Source: Ambulatory Visit | Attending: Internal Medicine

## 2020-10-17 ENCOUNTER — Inpatient Hospital Stay (HOSPITAL_COMMUNITY)
Admission: AD | Admit: 2020-10-17 | Discharge: 2020-10-21 | DRG: 320 | Disposition: A | Discharge status: Home / Self Care | Payer: Medicare HMO | Source: Ambulatory Visit | Attending: Internal Medicine | Admitting: Internal Medicine

## 2020-10-17 ENCOUNTER — Other Ambulatory Visit: Payer: Medicare HMO

## 2020-10-17 DIAGNOSIS — T82897A Other specified complication of cardiac prosthetic devices, implants and grafts, initial encounter: Secondary | ICD-10-CM | POA: Diagnosis present

## 2020-10-17 DIAGNOSIS — D589 Hereditary hemolytic anemia, unspecified: Secondary | ICD-10-CM | POA: Diagnosis not present

## 2020-10-17 DIAGNOSIS — Z951 Presence of aortocoronary bypass graft: Secondary | ICD-10-CM

## 2020-10-17 DIAGNOSIS — Z20822 Contact with and (suspected) exposure to covid-19: Secondary | ICD-10-CM | POA: Diagnosis present

## 2020-10-17 DIAGNOSIS — D75829 Heparin-induced thrombocytopenia, unspecified: Secondary | ICD-10-CM | POA: Diagnosis not present

## 2020-10-17 DIAGNOSIS — Z01818 Encounter for other preprocedural examination: Secondary | ICD-10-CM | POA: Diagnosis not present

## 2020-10-17 DIAGNOSIS — Z9889 Other specified postprocedural states: Secondary | ICD-10-CM | POA: Diagnosis not present

## 2020-10-17 DIAGNOSIS — J9801 Acute bronchospasm: Secondary | ICD-10-CM | POA: Diagnosis present

## 2020-10-17 DIAGNOSIS — I442 Atrioventricular block, complete: Secondary | ICD-10-CM | POA: Diagnosis present

## 2020-10-17 DIAGNOSIS — T45515A Adverse effect of anticoagulants, initial encounter: Secondary | ICD-10-CM | POA: Diagnosis present

## 2020-10-17 DIAGNOSIS — I700 Atherosclerosis of aorta: Secondary | ICD-10-CM | POA: Diagnosis present

## 2020-10-17 DIAGNOSIS — Q2543 Congenital aneurysm of aorta: Secondary | ICD-10-CM

## 2020-10-17 DIAGNOSIS — H409 Unspecified glaucoma: Secondary | ICD-10-CM | POA: Diagnosis present

## 2020-10-17 DIAGNOSIS — E785 Hyperlipidemia, unspecified: Secondary | ICD-10-CM | POA: Diagnosis present

## 2020-10-17 DIAGNOSIS — Z950 Presence of cardiac pacemaker: Secondary | ICD-10-CM | POA: Diagnosis not present

## 2020-10-17 DIAGNOSIS — I2510 Atherosclerotic heart disease of native coronary artery without angina pectoris: Secondary | ICD-10-CM | POA: Diagnosis present

## 2020-10-17 DIAGNOSIS — Z888 Allergy status to other drugs, medicaments and biological substances status: Secondary | ICD-10-CM

## 2020-10-17 DIAGNOSIS — Y712 Prosthetic and other implants, materials and accessory cardiovascular devices associated with adverse incidents: Secondary | ICD-10-CM | POA: Diagnosis present

## 2020-10-17 DIAGNOSIS — I4891 Unspecified atrial fibrillation: Secondary | ICD-10-CM | POA: Diagnosis present

## 2020-10-17 DIAGNOSIS — I255 Ischemic cardiomyopathy: Secondary | ICD-10-CM | POA: Diagnosis present

## 2020-10-17 DIAGNOSIS — I351 Nonrheumatic aortic (valve) insufficiency: Secondary | ICD-10-CM | POA: Diagnosis not present

## 2020-10-17 DIAGNOSIS — D649 Anemia, unspecified: Secondary | ICD-10-CM | POA: Diagnosis present

## 2020-10-17 DIAGNOSIS — Z862 Personal history of diseases of the blood and blood-forming organs and certain disorders involving the immune mechanism: Secondary | ICD-10-CM | POA: Diagnosis present

## 2020-10-17 DIAGNOSIS — T8203XA Leakage of heart valve prosthesis, initial encounter: Secondary | ICD-10-CM | POA: Diagnosis not present

## 2020-10-17 DIAGNOSIS — F419 Anxiety disorder, unspecified: Secondary | ICD-10-CM | POA: Diagnosis present

## 2020-10-17 DIAGNOSIS — I5042 Chronic combined systolic (congestive) and diastolic (congestive) heart failure: Secondary | ICD-10-CM | POA: Diagnosis present

## 2020-10-17 DIAGNOSIS — I441 Atrioventricular block, second degree: Secondary | ICD-10-CM

## 2020-10-17 DIAGNOSIS — Z95810 Presence of automatic (implantable) cardiac defibrillator: Secondary | ICD-10-CM

## 2020-10-17 DIAGNOSIS — I110 Hypertensive heart disease with heart failure: Secondary | ICD-10-CM | POA: Diagnosis not present

## 2020-10-17 DIAGNOSIS — Z952 Presence of prosthetic heart valve: Secondary | ICD-10-CM

## 2020-10-17 DIAGNOSIS — E66811 Drug-induced obesity: Secondary | ICD-10-CM

## 2020-10-17 DIAGNOSIS — I083 Combined rheumatic disorders of mitral, aortic and tricuspid valves: Secondary | ICD-10-CM | POA: Diagnosis not present

## 2020-10-17 DIAGNOSIS — I352 Nonrheumatic aortic (valve) stenosis with insufficiency: Secondary | ICD-10-CM | POA: Diagnosis present

## 2020-10-17 DIAGNOSIS — Z6834 Body mass index (BMI) 34.0-34.9, adult: Secondary | ICD-10-CM

## 2020-10-17 DIAGNOSIS — E661 Drug-induced obesity: Secondary | ICD-10-CM | POA: Diagnosis not present

## 2020-10-17 DIAGNOSIS — D539 Nutritional anemia, unspecified: Secondary | ICD-10-CM | POA: Diagnosis present

## 2020-10-17 DIAGNOSIS — G4733 Obstructive sleep apnea (adult) (pediatric): Secondary | ICD-10-CM | POA: Diagnosis present

## 2020-10-17 DIAGNOSIS — I252 Old myocardial infarction: Secondary | ICD-10-CM | POA: Diagnosis not present

## 2020-10-17 DIAGNOSIS — I350 Nonrheumatic aortic (valve) stenosis: Secondary | ICD-10-CM

## 2020-10-17 DIAGNOSIS — I509 Heart failure, unspecified: Secondary | ICD-10-CM

## 2020-10-17 DIAGNOSIS — E559 Vitamin D deficiency, unspecified: Secondary | ICD-10-CM | POA: Diagnosis not present

## 2020-10-17 DIAGNOSIS — I10 Essential (primary) hypertension: Secondary | ICD-10-CM | POA: Diagnosis present

## 2020-10-17 DIAGNOSIS — I517 Cardiomegaly: Secondary | ICD-10-CM | POA: Diagnosis not present

## 2020-10-17 DIAGNOSIS — R7303 Prediabetes: Secondary | ICD-10-CM | POA: Diagnosis present

## 2020-10-17 HISTORY — PX: TEMPORARY PACEMAKER: CATH118268

## 2020-10-17 LAB — URINALYSIS, ROUTINE W REFLEX MICROSCOPIC
Bilirubin Urine: NEGATIVE
Glucose, UA: NEGATIVE mg/dL
Ketones, ur: NEGATIVE mg/dL
Leukocytes,Ua: NEGATIVE
Nitrite: NEGATIVE
Protein, ur: 30 mg/dL — AB
Specific Gravity, Urine: 1.018 (ref 1.005–1.030)
pH: 5 (ref 5.0–8.0)

## 2020-10-17 LAB — SURGICAL PCR SCREEN
MRSA, PCR: NEGATIVE
Staphylococcus aureus: NEGATIVE

## 2020-10-17 LAB — TYPE AND SCREEN
ABO/RH(D): B POS
Antibody Screen: NEGATIVE

## 2020-10-17 LAB — SARS CORONAVIRUS 2 BY RT PCR (HOSPITAL ORDER, PERFORMED IN ~~LOC~~ HOSPITAL LAB): SARS Coronavirus 2: NEGATIVE

## 2020-10-17 SURGERY — TEMPORARY PACEMAKER
Anesthesia: LOCAL

## 2020-10-17 MED ORDER — CHLORHEXIDINE GLUCONATE 4 % EX LIQD
1.0000 "application " | Freq: Once | CUTANEOUS | Status: AC
  Administered 2020-10-18: 1 via TOPICAL
  Filled 2020-10-17: qty 60

## 2020-10-17 MED ORDER — ASPIRIN EC 81 MG PO TBEC
81.0000 mg | DELAYED_RELEASE_TABLET | Freq: Every day | ORAL | Status: DC
  Administered 2020-10-19 – 2020-10-21 (×3): 81 mg via ORAL
  Filled 2020-10-17 (×3): qty 1

## 2020-10-17 MED ORDER — MIDAZOLAM HCL 2 MG/2ML IJ SOLN
INTRAMUSCULAR | Status: DC | PRN
  Administered 2020-10-17 (×2): 1 mg via INTRAVENOUS

## 2020-10-17 MED ORDER — ATORVASTATIN CALCIUM 80 MG PO TABS
80.0000 mg | ORAL_TABLET | Freq: Every day | ORAL | Status: DC
  Administered 2020-10-19 – 2020-10-21 (×3): 80 mg via ORAL
  Filled 2020-10-17 (×3): qty 1

## 2020-10-17 MED ORDER — LIDOCAINE HCL (PF) 1 % IJ SOLN
INTRAMUSCULAR | Status: DC | PRN
  Administered 2020-10-17: 30 mL

## 2020-10-17 MED ORDER — BISACODYL 5 MG PO TBEC
5.0000 mg | DELAYED_RELEASE_TABLET | Freq: Once | ORAL | Status: AC
  Administered 2020-10-17: 5 mg via ORAL
  Filled 2020-10-17: qty 1

## 2020-10-17 MED ORDER — ONDANSETRON HCL 4 MG/2ML IJ SOLN: 4.0000 mg | Freq: Four times a day (QID) | INTRAMUSCULAR | Status: DC | PRN

## 2020-10-17 MED ORDER — POTASSIUM CHLORIDE CRYS ER 20 MEQ PO TBCR
20.0000 meq | EXTENDED_RELEASE_TABLET | Freq: Every day | ORAL | Status: DC
  Administered 2020-10-19: 20 meq via ORAL
  Filled 2020-10-17: qty 1

## 2020-10-17 MED ORDER — CHLORHEXIDINE GLUCONATE 0.12 % MT SOLN
15.0000 mL | Freq: Once | OROMUCOSAL | Status: AC
  Administered 2020-10-18: 15 mL via OROMUCOSAL
  Filled 2020-10-17: qty 15

## 2020-10-17 MED ORDER — FUROSEMIDE 40 MG PO TABS
40.0000 mg | ORAL_TABLET | Freq: Every day | ORAL | Status: DC
  Administered 2020-10-19 – 2020-10-21 (×3): 40 mg via ORAL
  Filled 2020-10-17 (×3): qty 1

## 2020-10-17 MED ORDER — LATANOPROST 0.005 % OP SOLN
1.0000 [drp] | Freq: Every day | OPHTHALMIC | Status: DC
  Filled 2020-10-17: qty 2.5

## 2020-10-17 MED ORDER — DEXMEDETOMIDINE HCL IN NACL 400 MCG/100ML IV SOLN
0.1000 ug/kg/h | INTRAVENOUS | Status: DC
  Filled 2020-10-17: qty 100

## 2020-10-17 MED ORDER — TRAZODONE HCL 50 MG PO TABS: 25.0000 mg | ORAL_TABLET | Freq: Every evening | ORAL | Status: DC | PRN

## 2020-10-17 MED ORDER — FENTANYL CITRATE (PF) 100 MCG/2ML IJ SOLN
INTRAMUSCULAR | Status: DC | PRN
  Administered 2020-10-17 (×2): 12.5 ug via INTRAVENOUS

## 2020-10-17 MED ORDER — ALLOPURINOL 100 MG PO TABS
100.0000 mg | ORAL_TABLET | Freq: Every day | ORAL | Status: DC
  Administered 2020-10-19 – 2020-10-21 (×3): 100 mg via ORAL
  Filled 2020-10-17 (×3): qty 1

## 2020-10-17 MED ORDER — SPIRONOLACTONE 25 MG PO TABS
25.0000 mg | ORAL_TABLET | Freq: Every day | ORAL | Status: DC
  Administered 2020-10-19 – 2020-10-21 (×3): 25 mg via ORAL
  Filled 2020-10-17 (×3): qty 1

## 2020-10-17 MED ORDER — POTASSIUM CHLORIDE 2 MEQ/ML IV SOLN
80.0000 meq | INTRAVENOUS | Status: DC
  Filled 2020-10-17: qty 40

## 2020-10-17 MED ORDER — MAGNESIUM SULFATE 50 % IJ SOLN
40.0000 meq | INTRAMUSCULAR | Status: DC
  Filled 2020-10-17: qty 9.85

## 2020-10-17 MED ORDER — MIDAZOLAM HCL 5 MG/5ML IJ SOLN
INTRAMUSCULAR | Status: AC
  Filled 2020-10-17: qty 5

## 2020-10-17 MED ORDER — SERTRALINE HCL 50 MG PO TABS
50.0000 mg | ORAL_TABLET | Freq: Every day | ORAL | Status: DC
  Administered 2020-10-19 – 2020-10-21 (×3): 50 mg via ORAL
  Filled 2020-10-17 (×3): qty 1

## 2020-10-17 MED ORDER — ACD FORMULA A 0.73-2.45-2.2 GM/100ML VI SOLN
1000.0000 mL | Status: DC
  Filled 2020-10-17: qty 1000

## 2020-10-17 MED ORDER — CHLORHEXIDINE GLUCONATE 4 % EX LIQD
4.0000 "application " | Freq: Once | CUTANEOUS | Status: DC
  Administered 2020-10-17: 4 via TOPICAL
  Filled 2020-10-17: qty 60

## 2020-10-17 MED ORDER — SODIUM CHLORIDE 0.9 % IV SOLN: INTRAVENOUS | Status: DC

## 2020-10-17 MED ORDER — ACETAMINOPHEN 500 MG PO TABS: 1000.0000 mg | ORAL_TABLET | Freq: Four times a day (QID) | ORAL | Status: DC | PRN

## 2020-10-17 MED ORDER — ACETAMINOPHEN 325 MG PO TABS
325.0000 mg | ORAL_TABLET | ORAL | Status: DC | PRN
Start: 2020-10-17 — End: 2020-10-21

## 2020-10-17 MED ORDER — ALPRAZOLAM 0.25 MG PO TABS: 0.2500 mg | ORAL_TABLET | Freq: Three times a day (TID) | ORAL | Status: DC | PRN

## 2020-10-17 MED ORDER — LIDOCAINE HCL 1 % IJ SOLN
INTRAMUSCULAR | Status: AC
  Filled 2020-10-17: qty 40

## 2020-10-17 MED ORDER — POVIDONE-IODINE 10 % EX SWAB
2.0000 "application " | Freq: Once | CUTANEOUS | Status: AC
  Administered 2020-10-17: 2 via TOPICAL

## 2020-10-17 MED ORDER — TEMAZEPAM 15 MG PO CAPS
15.0000 mg | ORAL_CAPSULE | Freq: Once | ORAL | Status: AC | PRN
  Administered 2020-10-17: 15 mg via ORAL
  Filled 2020-10-17: qty 1

## 2020-10-17 MED ORDER — SACUBITRIL-VALSARTAN 49-51 MG PO TABS
1.0000 | ORAL_TABLET | Freq: Two times a day (BID) | ORAL | Status: DC
  Administered 2020-10-17 – 2020-10-21 (×7): 1 via ORAL
  Filled 2020-10-17 (×7): qty 1

## 2020-10-17 MED ORDER — CEFAZOLIN SODIUM-DEXTROSE 2-4 GM/100ML-% IV SOLN
2.0000 g | INTRAVENOUS | Status: DC
  Filled 2020-10-17: qty 100

## 2020-10-17 MED ORDER — NOREPINEPHRINE 4 MG/250ML-% IV SOLN
0.0000 ug/min | INTRAVENOUS | Status: DC
  Filled 2020-10-17: qty 250

## 2020-10-17 MED ORDER — FENTANYL CITRATE (PF) 100 MCG/2ML IJ SOLN
INTRAMUSCULAR | Status: AC
  Filled 2020-10-17: qty 2

## 2020-10-17 SURGICAL SUPPLY — 8 items
KIT MICROPUNCTURE NIT STIFF (SHEATH) ×4
LEAD CAPSURE NOVUS 5076-58CM (Lead) ×2 IMPLANT
MAT PREVALON FULL STRYKER (MISCELLANEOUS) ×2
PACE AZURE XT SR MRI W1SR01 (Pacemaker) ×2 IMPLANT
PAD PRO RADIOLUCENT 2001M-C (PAD) ×2
SHEATH 7FR PRELUDE SNAP 13 (SHEATH) ×2
SHEATH PROBE COVER 6X72 (BAG) ×2
TRAY PACEMAKER INSERTION (PACKS) ×2

## 2020-10-17 NOTE — H&P (Signed)
[address]       [place] [address]             [phone]      Cardiothoracic Surgery Admission History and Physical   Patient ID: Gregory [name] MRN: [id number]; DOB: 1950-05-24   Admit date: 10/10/2020 Date of Consult: 10/10/2020   Primary Care Provider: Jinny Sanders, MD [facility] Cardiologist: Gregory Breeding, MD  [facility] Electrophysiologist:  None      Patient Profile:    Gregory [name] is a 70 y.o. male with a hx of CAD and aortic valve disease s/p CABG/AVR 03/06/2019 with a 25 mm [name] Intuity rapid deployment valve and CABG x 3 with a left radial to PDA, LIMA-LAD, and pedicled RIMA-RPL by Dr. Julien [name], TAA, post op HIT, HTN, obesity, chronic diastolic CHF with recent admission for acute on chronic diastolic CHF, HAVB, worsening anemia and found to have severe bioprosthetic valve dysfunction with severe AI and hemolytic anemia who is being seen today for the evaluation of bioprosthetic valve dysfunction at the request of Gregory [name]   History of Present Illness:    The patient underwent CABG/AVR 03/06/2019 with a 25 mm [name] Intuity rapid deployment valve and CABG x 3 with a left radial to PDA, LIMA-LAD, and pedicled RIMA - RPL. The patient's postoperative course was complicated by heparin-induced thrombocytopenia.  He was ultimately treated with several months of warfarin and recovered uneventfully.   He was in his usual state of health until the last several months when he developed progressive fatigue, exertional dyspnea, fatigue, palpitations as well as anemia.    He was admitted in 09/2020 for 2-1 heart block discovered on an outpatient monitor as well as symptoms of volume overload. During his admission he was diuresed with IV lasix and seen by electrophysiology. Repeat echo showed EF 40-45%, LV dilation, mild LVH, mild aortic root dilatation (4.48mm) and moderate ascending aorta dilation (23mm) as well as severe paravalvular  regurgitation in 2 different areas around the sewing ring of his bovine bioprosthesis. Gated cardiac CT done during this admission showed a 25 mm [name] Intuity bioprosthetic valve is present in the aortic position. The leaflets of the prosthesis are freely mobile without evidence of leaflet thickening or thrombosis. There are 2 areas of paravalvular leak under the RCC and LCC related. The leak under the RCC measures ~3.7 mm x ~8.9 mm. The leak under the [place] is ~3.8 mm x 9.1 mm. The leak under then [facility] is likely related to heavy annular calcifications under the [place]. 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 Sinotubular Junction: 41 mm (double oblique). Ascending Thoracic Aorta: 41 mm (double oblique). Severely dilated left ventricular cavity. CTA of the aorta and iliac vessels demonstrate what appear to be adequate pelvic vascular access to facilitate a transfemoral approach.     The patient was feeling better after diuresis and was ultimately discharge home. Permanent pacing was not recommended given possible need for redo surgery.    He was seen in the office by Dr. Burt [name] on 10/06/20. Dr. Burt [name] felt he needed to undergo further evaluation with a transesophageal echo study to further assess the mechanism of his paravalvular regurgitation.  It appears that there is a gap around both the left coronary cusp and the right coronary cusp of the aortic valve annulus causing to separate leaks, cumulatively resulting in a large leak.  If we treat him percutaneously, we would consider placement of a temp/perm  pacemaker lead followed by balloon valvuloplasty of the surgical sewing ring in order to better expand the surgical prosthesis and potentially seal the paravalvular leaks.  Placement of the temp/perm pacemaker would be considered because the likelihood of complete AV block and pacemaker dependence would be exceedingly high after this procedure.  It is possible that following balloon valvuloplasty, the  patient could need valve in valve TAVR if there is leaflet damage or central regurgitation.  It is also possible that he would need paravalvular leak closure if the balloon valvuloplasty procedure is unsuccessful.  After his valve procedure is completed, he would require permanent pacemaker placement. He also recommended a L/RCH to assess his coronary anatomy and bypass grafts as well as hemodynamics. Of note, NO HEPARIN PRODUCTS TO BE USED GIVEN HISTORY OF HIT.   Most recent labs showed a creat of 1.23, GFR 62, K 5.2, Na 141, Hg 10.2, Haptoglobin <10 and LDH 939. TEE and L/RHC were set up for today.    TEE showed EF 30-35%, mild RV enlargement/dysfunction, severe AI with severe peri annular regurgitation seen at least 3 areas around right and left sinus. Poor stent skirt appostion below annulus. Also, appears to be limited motion of one of the leaflets. Elevated transgastric gradient mean 39 peak 86 mmHg likely reprsents combination of leaflet dysfunction and severe AR. Estimated AVA 1.64 cm2. Mild to moderate aortic valve stenosis. Of note the patient had bronchospasm and low sats throughout case.   L/RHC showed 3V native CAD with 3/3 patent bypass grafts with normal wedge and right heart pressures as well as normal cardiac output. Angiomax was used in substitution of heparin.    Patient seen in cath lab holding area. He is feeling fine. No chest pain. Breathing much better since recent admission and 20 lbs diuresis. No LE edema, orthopnea or PND. No dizziness or syncope. No blood in stool or urine. No palpitations. Reports having all native teeth with regular dental work.            Past Medical History:  Diagnosis Date   Alcohol abuse     Anxiety     Aortic valve disease     Arthritis     Coronary artery disease     Glaucoma     Hypertension     OSA (obstructive sleep apnea)     Pre-diabetes     Shingles             Past Surgical History:  Procedure Laterality Date   AORTIC VALVE  REPLACEMENT N/A 03/06/2019    Procedure: AORTIC VALVE REPLACEMENT (AVR), USING INTUITY 25MM;  Surgeon: Wonda Olds, MD;  Location: [place];  Service: Open Heart Surgery;  Laterality: N/A;   CARDIAC CATHETERIZATION       CATARACT EXTRACTION       CORONARY ARTERY BYPASS GRAFT N/A 03/06/2019    Procedure: CORONARY ARTERY BYPASS GRAFTING (CABG), ON PUMP, TIMES THREE, USING BILATERAL INTERNAL MAMMARIES AND LEFT RADIAL ARTERY HARVEST;  Surgeon: Wonda Olds, MD;  Location: [place];  Service: Open Heart Surgery;  Laterality: N/A;  BILATERAL IMA   LEFT HEART CATH AND CORONARY ANGIOGRAPHY N/A 03/04/2019    Procedure: LEFT HEART CATH AND CORONARY ANGIOGRAPHY;  Surgeon: Burnell Blanks, MD;  Location: [place] [facility];  Service: Cardiovascular;  Laterality: N/A;   None       RADIAL ARTERY HARVEST Left 03/06/2019    Procedure: RADIAL ARTERY HARVEST;  Surgeon: Wonda Olds, MD;  Location: [facility];  Service: Open Heart Surgery;  Laterality: Left;   TEE WITHOUT CARDIOVERSION N/A 03/06/2019    Procedure: TRANSESOPHAGEAL ECHOCARDIOGRAM (TEE);  Surgeon: Wonda Olds, MD;  Location: [place];  Service: Open Heart Surgery;  Laterality: N/A;      Home Medications:         Prior to Admission medications   Medication Sig Start Date End Date Taking? Authorizing Provider  acetaminophen (TYLENOL) 500 MG tablet Take 1,000 mg by mouth every 6 (six) hours as needed for moderate pain.     Yes [provider]  allopurinol (ZYLOPRIM) 100 MG tablet Take 1 tablet (100 mg total) by mouth daily. 03/18/20   Yes Bedsole, Amy E, MD  ALPRAZolam (XANAX) 0.5 MG tablet TAKE 1 TABLET BY MOUTH ONCE DAILY AS NEEDED FOR ANXIETY TAKE  30  MINUTES  PRIOR  TO  PLANE  FLIGHT  OR  AS  NEEDED  FOR  ANXIETY 10/26/19   Yes Bedsole, Amy E, MD  aspirin EC 81 MG tablet Take 1 tablet (81 mg total) by mouth daily. Swallow whole. 08/06/19   Yes Gregory Breeding, MD  atorvastatin (LIPITOR) 80 MG tablet Take 1 tablet (80 mg total) by  mouth daily. 12/22/19   Yes Bedsole, Amy E, MD  colchicine 0.6 MG tablet Take 2 tablets by mouth once daily Patient taking differently: Take 0.3 mg by mouth daily. Take 1 tablet (0.3 mg) by mouth scheduled every day, may take 1 additional tablet if needed for gout flare 01/08/20   Yes Bedsole, Amy E, MD  furosemide (LASIX) 20 MG tablet Take 2 tablets in the morning and 1 tablet in the afternoon 10/02/20   Yes Almyra Deforest, PA  ketoconazole (NIZORAL) 2 % shampoo Apply 1 application topically 2 (two) times a week. Patient taking differently: Apply 1 application topically once a week. 05/09/20   Yes Bedsole, Amy E, MD  latanoprost (XALATAN) 0.005 % ophthalmic solution Place 1 drop into both eyes every morning.  10/25/18   Yes [provider]  Misc Natural Products (OSTEO BI-FLEX/5-LOXIN ADVANCED) TABS Take 2 tablets by mouth daily.     Yes [provider]  potassium chloride SA (KLOR-CON) 20 MEQ tablet Take 1 tablet (20 mEq total) by mouth daily. 10/03/20   Yes Meng, Isaac Laud, PA  sacubitril-valsartan (ENTRESTO) 49-51 MG Take 1 tablet by mouth 2 (two) times daily. 10/02/20   Yes Almyra Deforest, PA  sertraline (ZOLOFT) 50 MG tablet Take 1 tablet (50 mg total) by mouth daily. 12/22/19   Yes Bedsole, Amy E, MD  spironolactone (ALDACTONE) 25 MG tablet Take 1 tablet (25 mg total) by mouth daily. 10/03/20   Yes Almyra Deforest, PA  traZODone (DESYREL) 50 MG tablet Take 0.5-1 tablets (25-50 mg total) by mouth at bedtime as needed for sleep. Patient taking differently: Take 50 mg by mouth at bedtime as needed for sleep. 12/22/19   Yes Bedsole, Amy E, MD      Inpatient Medications: Scheduled Meds:   Continuous Infusions:   PRN Meds:     Allergies:         Allergies  Allergen Reactions   Heparin        HIT antibody and SRA positive      Social History:   Social History         Socioeconomic History   Marital status: Married      Spouse name: Cindi   Number of children: 3   Years of education: Not  on file  Highest education level: Not on file  Occupational History   Occupation: Architect  Tobacco Use   Smoking status: Never   Smokeless tobacco: Never  Vaping Use   Vaping Use: Never used  Substance and Sexual Activity   Alcohol use: Yes      Alcohol/week: 12.0 standard drinks      Types: 12 Cans of beer per week   Drug use: Yes      Frequency: 2.0 times per week      Types: Marijuana      Comment: gummies   Sexual activity: Not on file  Other Topics Concern   Not on file  Social History Narrative    Lives at home with wife.    Social Determinants of Health       Financial Resource Strain: Low Risk    Difficulty of Paying Living Expenses: Not hard at all  Food Insecurity: No Food Insecurity   Worried About Charity fundraiser in the Last Year: Never true   [place] in the Last Year: Never true  Transportation Needs: No Transportation Needs   Lack of Transportation (Medical): No   Lack of Transportation (Non-Medical): No  Physical Activity: Inactive   Days of Exercise per Week: 0 days   Minutes of Exercise per Session: 0 min  Stress: Stress Concern Present   Feeling of Stress : To some extent  Social Connections: Not on file  Intimate Partner Violence: Not At Risk   Fear of Current or Ex-Partner: No   Emotionally Abused: No   Physically Abused: No   Sexually Abused: No    Family History:          Family History  Adopted: Yes  Problem Relation Age of Onset   Cancer Mother          breast cancer   Alcohol abuse Father     Breast cancer Sister        ROS:  Please see the history of present illness.  All other ROS reviewed and negative.      Physical Exam/Data:        Vitals:    10/10/20 1147 10/10/20 1410  BP: 122/65    Resp: 20    Temp: 98.1 F (36.7 C)    TempSrc: Temporal    SpO2: 97% 93%  Weight: 93.4 kg    Height: 5\' 7"  (1.702 m)        Intake/Output Summary (Last 24 hours) at 10/10/2020 1503 Last data filed at 10/10/2020  1314    Gross per 24 hour  Intake 200 ml  Output --  Net 200 ml    Last 3 Weights 10/10/2020 10/06/2020 09/29/2020  Weight (lbs) 206 lb 217 lb 6.4 oz 225 lb 15.5 oz  Weight (kg) 93.441 kg 98.612 kg 102.5 kg     Body mass index is 32.26 kg/m.  General:  Well nourished, well developed, in no acute distress HEENT: normal Lymph: no adenopathy Neck: no JVD Endocrine:  No thryomegaly Vascular: No carotid bruits; FA pulses 2+ bilaterally without bruits  Cardiac:  normal S1, S2; RRR; systolic and soft diastolic murmur Lungs:  clear to auscultation bilaterally, no wheezing, rhonchi or rales  Abd: soft, nontender, no hepatomegaly  Ext: no edema Musculoskeletal:  No deformities, BUE and BLE strength normal and equal Skin: warm and dry  Neuro:  CNs 2-12 intact, no focal abnormalities noted Psych:  Normal affect    EKG:  The EKG was personally reviewed and  demonstrates:  Sinus rhythm Mobitz II 2-degree AV block, LBBB. HR 38 bpm     Relevant CV Studies: Outpatient monitor 9/12-9/28/22 Predominant rhythm sinus bradycardia High degree heart block Mobitz Type I and II with probable episodes of complete heart block Junctional rhythm   ____________________   Echo 09/29/20 IMPRESSIONS   1. The aortic valve has been Intuity 25 mm valve in the aortic position.  There are two peri-valvular jets that are moderate in severity; in  summation suggestive of severe regurgitation. There is a 25 mm INTUITY  valve present in the aortic position.  Procedure Date: 03/06/2019.   2. Left ventricular ejection fraction, by estimation, is 40 to 45%. The  left ventricle has mildly decreased function. The left ventricle  demonstrates global hypokinesis. The left ventricular internal cavity size  was moderately dilated. There is mild  concentric left ventricular hypertrophy. Left ventricular diastolic  parameters are indeterminate.   3. Right ventricular systolic function is normal. The right ventricular   size is normal. There is normal pulmonary artery systolic pressure.   4. Left atrial size was severely dilated.   5. The mitral valve is normal in structure. No evidence of mitral valve  regurgitation.   6. Aortic dilatation noted. There is mild dilatation of the aortic root,  measuring 44 mm. There is moderate dilatation of the ascending aorta,  measuring 45 mm.   Comparison(s): A prior study was performed on 12/07/2019. LV function is  slightly worse, LV dilation has dilated. Comparison of the apical 5  chamber view and PSAX view (which does not show both jets) shows worse regurgitation despite being done at a lower   Nyquist limit. In comparison to post operative TEE, perivalvular leak  which was present psot op has worsened.   Conclusion(s)/Recommendation(s): TEE or cardiac MRI could be considered for further quantification of valve dysfunction.    ____________________________   Cardiac CT 09/30/20 IMPRESSION: 1. 25 mm [name] Intuity bioprosthetic valve with 2 identified paravalvular leaks under the RCC and LCC as detailed above.   2. 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   3. S/p CABG.  LIMA-LAD and RIMA-RCA are seen.   4. Severely dilated left ventricular cavity.   5. Dilated pulmonary artery suggestive of pulmonary hypertension.   [place] T. O'Neal, MD   ______________________   CT chest/abdomen/pelvis 09/30/20 VASCULAR MEASUREMENTS PERTINENT TO TAVR:   AORTA:   Minimal Aortic Diameter-22 x 19 mm   Severity of Aortic Calcification-moderate   RIGHT PELVIS:   Right Common Iliac Artery -   Minimal Diameter-11.8 x 13.3 mm   Tortuosity-mild   Calcification-moderate to severe   Right External Iliac Artery -   Minimal Diameter-10.9 x 11.3 mm   Tortuosity-severe   Calcification-none   Right Common Femoral Artery -   Minimal Diameter-11.9 x 10.9 mm   Tortuosity-mild   Calcification-mild   LEFT PELVIS:   Left Common Iliac Artery -    Minimal Diameter-14.8 x 13.9 mm   Tortuosity-mild   Calcification-moderate to severe   Left External Iliac Artery -   Minimal Diameter-11.2 x 12.0 mm   Tortuosity-moderate   Calcification-none   Left Common Femoral Artery -   Minimal Diameter-10.7 x 10.9 mm   Tortuosity-mild   Calcification-mild   Review of the MIP images confirms the above findings.   IMPRESSION: 1. Vascular findings and measurements pertinent to potential TAVR procedure, as detailed above. 2. Status post aortic valve replacement with what appears to be a stented bioprosthesis.  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 Ascending thoracic aortic aneurysm. Recommend semi-annual imaging followup by CTA or MRA and referral to cardiothoracic surgery if not already obtained. This recommendation follows 2010 ACCF/AHA/AATS/ACR/ASA/SCA/SCAI/SIR/STS/SVM Guidelines for the Diagnosis and Management of Patients With Thoracic Aortic Disease. Circulation. 2010; 121: M086-P619. Aortic aneurysm NOS (ICD10-I71.9). 3. Aortic atherosclerosis, in addition to left main and 3 vessel coronary artery disease. Status post median sternotomy for CABG including LIMA to the LAD. 4. Mild cardiomegaly. 5. Small bilateral pleural effusions lying dependently. 6. Colonic diverticulosis without evidence of acute diverticulitis at this time. 7. Additional incidental findings, as above.   _____________________   TEE 10/10/20 IMPRESSIONS   1. Exam limited by patient bronchospasm and desats with coughing  througout study despite propofol.   2. Left ventricular ejection fraction, by estimation, is 30 to 35%. The  left ventricle has normal function. The left ventricle demonstrates global  hypokinesis. The left ventricular internal cavity size was severely  dilated. Left ventricular diastolic  function could not be evaluated.   3. Right ventricular  systolic function is mildly reduced. The right  ventricular size is mildly enlarged.   4. Left atrial size was moderately dilated. No left atrial/left atrial  appendage thrombus was detected.   5. Right atrial size was mildly dilated.   6. The mitral valve is abnormal. Mild mitral valve regurgitation.   7. 25 mm Intuity hybrid valve. Severe peri annular regurgitatiion seen at  least 3 areas around right and left sinus. Poor stent skirt appostion  below annulus. Also appears to be limited motion of one of the leaflets.  Elevated transgastric gradient mean  39 peak 86 mmHg likely reprsents combination of leaflet dysfunction and  severe AR Estimted AVA 1.64 cm2 . The aortic valve has been  repaired/replaced. Aortic valve regurgitation is severe. Mild to moderate  aortic valve stenosis. There is a 25 mm Intuity  valve present in the aortic position. Procedure Date: 03/06/19.    _________________________   L/RHC 10/11/18: AORTIC ARCH ANGIOGRAPHY  RIGHT HEART CATH AND CORONARY/GRAFT ANGIOGRAPHY    Conclusion       Prox LAD to Mid LAD lesion is 90% stenosed.   Mid LAD-1 lesion is 70% stenosed.   Prox RCA lesion is 70% stenosed.   RV Branch lesion is 80% stenosed.   Prox Cx lesion is 50% stenosed.   Mid LAD-2 lesion is 100% stenosed.   LIMA graft was visualized by angiography and is normal in caliber.   The graft exhibits no disease.     Laboratory Data:   High Sensitivity Troponin:   Last Labs   No results for input(s): TROPONINIHS in the last 720 hours.     Chemistry Last Labs       Recent Labs  Lab 10/06/20 1629 10/10/20 1450  NA 141 138  K 5.2 4.8  CL 103  --   CO2 23  --   GLUCOSE 105*  --   BUN 29*  --   CREATININE 1.23  --   CALCIUM 8.8  --       Last Labs   No results for input(s): PROT, ALBUMIN, AST, ALT, ALKPHOS, BILITOT in the last 168 hours.   Hematology Last Labs       Recent Labs  Lab 10/06/20 1629 10/10/20 1450  WBC 5.8  --   RBC 3.72*  --    HGB 10.2* 10.5*  HCT 32.2*  31.0*  MCV 87  --   MCH 27.4  --   MCHC 31.7  --   RDW 16.7*  --   PLT 220  --       BNP Last Labs   No results for input(s): BNP, PROBNP in the last 168 hours.    DDimer  Last Labs   No results for input(s): DDIMER in the last 168 hours.       Radiology/Studies:  ECHO TEE   Result Date: 10/10/2020    TRANSESOPHOGEAL ECHO REPORT   Patient Name:   Gregory [name] Date of Exam: 10/10/2020 Medical Rec #:  [id number]         Height:       67.0 in Accession #:    [id number]        Weight:       217.4 lb Date of Birth:  Dec 19, 1950         BSA:          2.095 m Patient Age:    35 years          BP:           122/65 mmHg Patient Gender: M                 HR:           80 bpm. Exam Location:  Inpatient Procedure: 3D Echo, Transesophageal Echo, Cardiac Doppler and Color Doppler Indications:     I35.1 Nonrheumatic aortic (valve) insufficiency  History:         Patient has prior history of Echocardiogram examinations. CHF                  and Cardiomyopathy, Previous Myocardial Infarction, Prior CABG                  and Abnormal ECG, Aortic Valve Disease, Arrythmias:Atrial                  Fibrillation, Signs/Symptoms:Murmur; Risk Factors:Dyslipidemia,                  Hypertension and Sleep Apnea.                  Aortic Valve: 25 mm Intuity valve is present in the aortic                  position. Procedure Date: 03/06/19.  Sonographer:     Roseanna Rainbow RDCS Referring Phys:  [place] Diagnosing Phys: Jenkins Rouge MD PROCEDURE: After discussion of the risks and benefits of a TEE, an informed consent was obtained from the patient. The transesophogeal probe was passed without difficulty through the esophogus of the patient. Imaged were obtained with the patient in a left lateral decubitus position. Sedation performed by performing physician. The patient was monitored while under deep sedation. Anesthestetic sedation was provided intravenously by Anesthesiology: 264mg  of  Propofol. The patient developed Respiratory depression during the procedure. IMPRESSIONS  1. Exam limited by patient bronchospasm and desats with coughing througout study despite propofol.  2. Left ventricular ejection fraction, by estimation, is 30 to 35%. The left ventricle has normal function. The left ventricle demonstrates global hypokinesis. The left ventricular internal cavity size was severely dilated. Left ventricular diastolic function could not be evaluated.  3. Right ventricular systolic function is mildly reduced. The right ventricular size is mildly enlarged.  4. Left atrial size was moderately dilated. No left atrial/left atrial appendage thrombus was  detected.  5. Right atrial size was mildly dilated.  6. The mitral valve is abnormal. Mild mitral valve regurgitation.  7. 25 mm Intuity hybrid valve. Severe peri annular regurgitatiion seen at least 3 areas around right and left sinus. Poor stent skirt appostion below annulus. Also appears to be limited motion of one of the leaflets. Elevated transgastric gradient mean 39 peak 86 mmHg likely reprsents combination of leaflet dysfunction and severe AR Estimted AVA 1.64 cm2 . The aortic valve has been repaired/replaced. Aortic valve regurgitation is severe. Mild to moderate aortic valve stenosis. There is a 25 mm Intuity valve present in the aortic position. Procedure Date: 03/06/19. FINDINGS  Left Ventricle: Left ventricular ejection fraction, by estimation, is 30 to 35%. The left ventricle has normal function. The left ventricle demonstrates global hypokinesis. The left ventricular internal cavity size was severely dilated. Left ventricular  diastolic function could not be evaluated. Right Ventricle: The right ventricular size is mildly enlarged. Right vetricular wall thickness was not assessed. Right ventricular systolic function is mildly reduced. Left Atrium: Left atrial size was moderately dilated. No left atrial/left atrial appendage thrombus was  detected. Right Atrium: Right atrial size was mildly dilated. Pericardium: There is no evidence of pericardial effusion. Mitral Valve: The mitral valve is abnormal. Mild mitral valve regurgitation. Tricuspid Valve: The tricuspid valve is normal in structure. Tricuspid valve regurgitation is mild. Aortic Valve: 25 mm Intuity hybrid valve. Severe peri annular regurgitatiion seen at least 3 areas around right and left sinus. Poor stent skirt appostion below annulus. Also appears to be limited motion of one of the leaflets. Elevated transgastric gradient mean 39 peak 86 mmHg likely reprsents combination of leaflet dysfunction and severe AR Estimted AVA 1.64 cm2. The aortic valve has been repaired/replaced. Aortic valve regurgitation is severe. Mild to moderate aortic stenosis is present. Aortic valve mean gradient measures 39.0 mmHg. Aortic valve peak gradient measures 85.4 mmHg. Aortic valve area, by VTI measures 1.32 cm. There is a 25 mm Intuity valve present in the aortic position. Procedure Date: 03/06/19. Pulmonic Valve: The pulmonic valve was grossly normal. Pulmonic valve regurgitation is mild. Aorta: The aortic root was not well visualized. IAS/Shunts: No atrial level shunt detected by color flow Doppler. Additional Comments: Exam limited by patient bronchospasm and desats with coughing througout study despite propofol.  LEFT VENTRICLE PLAX 2D LVOT diam:     2.19 cm LV SV:         135 LV SV Index:   64 LVOT Area:     3.77 cm  AORTIC VALVE AV Area (Vmax):    1.64 cm AV Area (Vmean):   1.77 cm AV Area (VTI):     1.32 cm AV Vmax:           462.00 cm/s AV Vmean:          285.000 cm/s AV VTI:            1.020 m AV Peak Grad:      85.4 mmHg AV Mean Grad:      39.0 mmHg LVOT Vmax:         201.00 cm/s LVOT Vmean:        134.000 cm/s LVOT VTI:          0.357 m LVOT/AV VTI ratio: 0.35  AORTA Ao Asc diam: 3.59 cm  SHUNTS Systemic VTI:  0.36 m Systemic Diam: 2.19 cm Jenkins Rouge MD Electronically signed by Jenkins Rouge  MD Signature Date/Time: 10/10/2020/2:51:13 PM    Final  STS Risk Calculator: Procedure: Isolated AVR Risk of Mortality: 2.512% Renal Failure: 4.788% Permanent Stroke: 0.834% Prolonged Ventilation: 11.930% DSW Infection: 0.356% Reoperation: 6.136% Morbidity or Mortality: 15.608% Short Length of Stay: 26.343% Long Length of Stay: 6.105%     _____________________   [facility] Cardiomyopathy Questionnaire  KCCQ-12 10/10/2020  1 a. Ability to shower/bathe Slightly limited  1 b. Ability to walk 1 block Extremely limited  1 c. Ability to hurry/jog Other, Did not do  2. Edema feet/ankles/legs 3+ times a week, not every day  3. Limited by fatigue All of the time  4. Limited by dyspnea At least once a day  5. Sitting up / on 3+ pillows Never over the past 2 weeks  6. Limited enjoyment of life Extremely limited  7. Rest of life w/ symptoms Not at all satisfied  8 a. Participation in hobbies Slightly limited  8 b. Participation in chores Moderately limited  8 c. Visiting family/friends N/A, did not do for other reasons        Assessment and Plan:       This 70 year old gentleman is status post CABG x3 with aortic valve replacement using a 25 mm Intuity rapid deployment valve in March 2021.  He recently presented with [place] Heart Association class IV symptoms of shortness of breath and marked lower extremity edema with an ejection fraction of 30 to 35% and multiple paravalvular leaks.  His gated cardiac CTA showed that the leaflets were freely mobile without evidence of thickening or thrombosis although there was some suggestion on TEE today that one of the leaflets was not moving normally.  There are 2 jets of paravalvular leakage seen on CTA under the RCC and LCC.  There were 3 jets seen today by TEE.  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  Cardiac catheterization today showed patent bypass grafts with a PA  pressure of 35/15 and wedge pressure of 10.  Right atrial pressure is 5.  PA sat was 60% with a cardiac index of 6.4 by Fick and 3.2 by thermodilution.  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  He has a history of HIT and received Angiomax instead of heparin for his procedure today.  I think his operative risk for redo sternotomy and redo aortic valve replacement would be significantly higher than that estimated by the STS risk profile especially considering his aortic root aneurysm, history of HIT preventing use of heparin, and patent bilateral internal mammary artery grafts with a radial artery graft coming off of the right internal mammary artery graft.  I think the best option for treating him will be balloon dilatation of the Intuity valve to try to get better apposition of the stent cuff to the annulus.  This could result in valve dysfunction and central aortic insufficiency that would require urgent valve in valve TAVR.  I discussed all this with the patient and his wife by telephone and all of their questions have been answered.      Gaye Pollack, MD

## 2020-10-17 NOTE — H&P (Signed)
Patient ID: Gregory [name] MRN: [id number]; DOB: 1950/11/07   Admit date: 10/10/2020 Date of Consult: 10/10/2020   Primary Care Provider: Jinny Sanders, MD [facility] Cardiologist: Gregory Breeding, MD  [facility] Electrophysiologist:  None      Patient Profile:    Gregory [name] is a 70 y.o. male with a hx of CAD and aortic valve disease s/p CABG/AVR 03/06/2019 with a 25 mm [name] Intuity rapid deployment valve and CABG x 3 with a left radial to PDA, LIMA-LAD, and pedicled RIMA-RPL by Dr. Julien [name], TAA, post op HIT, HTN, obesity, chronic diastolic CHF with recent admission for acute on chronic diastolic CHF, HAVB, worsening anemia and found to have severe bioprosthetic valve dysfunction with severe AI and hemolytic anemia who is being seen today for the evaluation of bioprosthetic valve dysfunction at the request of Gregory [name]   History of Present Illness:    The patient underwent CABG/AVR 03/06/2019 with a 25 mm [name] Intuity rapid deployment valve and CABG x 3 with a left radial to PDA, LIMA-LAD, and pedicled RIMA - RPL. The patient's postoperative course was complicated by heparin-induced thrombocytopenia.  He was ultimately treated with several months of warfarin and recovered uneventfully.   He was in his usual state of health until the last several months when he developed progressive fatigue, exertional dyspnea, fatigue, palpitations as well as anemia.    He was admitted in 09/2020 for 2-1 heart block discovered on an outpatient monitor as well as symptoms of volume overload. During his admission he was diuresed with IV lasix and seen by electrophysiology. Repeat echo showed EF 40-45%, LV dilation, mild LVH, mild aortic root dilatation (4.64mm) and moderate ascending aorta dilation (30mm) as well as severe paravalvular regurgitation in 2 different areas around the sewing ring of his bovine bioprosthesis. Gated cardiac CT done during this admission showed a 25 mm [name] Intuity  bioprosthetic valve is present in the aortic position. The leaflets of the prosthesis are freely mobile without evidence of leaflet thickening or thrombosis. There are 2 areas of paravalvular leak under the RCC and LCC related. The leak under the RCC measures ~3.7 mm x ~8.9 mm. The leak under the [place] is ~3.8 mm x 9.1 mm. The leak under then [facility] is likely related to heavy annular calcifications under the [place]. 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 Sinotubular Junction: 41 mm (double oblique). Ascending Thoracic Aorta: 41 mm (double oblique). Severely dilated left ventricular cavity. CTA of the aorta and iliac vessels demonstrate what appear to be adequate pelvic vascular access to facilitate a transfemoral approach.     The patient was feeling better after diuresis and was ultimately discharge home. Permanent pacing was not recommended given possible need for redo surgery.    He was seen in the office by Dr. Burt [name] on 10/06/20. Dr. Burt [name] felt he needed to undergo further evaluation with a transesophageal echo study to further assess the mechanism of his paravalvular regurgitation.  It appears that there is a gap around both the left coronary cusp and the right coronary cusp of the aortic valve annulus causing to separate leaks, cumulatively resulting in a large leak.  If we treat him percutaneously, we would consider placement of a temp/perm pacemaker lead followed by balloon valvuloplasty of the surgical sewing ring in order to better expand the surgical prosthesis and potentially seal the paravalvular leaks.  Placement of the temp/perm pacemaker would be considered because the likelihood of complete AV block and pacemaker  dependence would be exceedingly high after this procedure.  It is possible that following balloon valvuloplasty, the patient could need valve in valve TAVR if there is leaflet damage or central regurgitation.  It is also possible that he would need paravalvular leak closure if  the balloon valvuloplasty procedure is unsuccessful.  After his valve procedure is completed, he would require permanent pacemaker placement. He also recommended a L/RCH to assess his coronary anatomy and bypass grafts as well as hemodynamics. Of note, NO HEPARIN PRODUCTS TO BE USED GIVEN HISTORY OF HIT.   Most recent labs showed a creat of 1.23, GFR 62, K 5.2, Na 141, Hg 10.2, Haptoglobin <10 and LDH 939. TEE and L/RHC were set up for today.    TEE showed EF 30-35%, mild RV enlargement/dysfunction, severe AI with severe peri annular regurgitation seen at least 3 areas around right and left sinus. Poor stent skirt appostion below annulus. Also, appears to be limited motion of one of the leaflets. Elevated transgastric gradient mean 39 peak 86 mmHg likely reprsents combination of leaflet dysfunction and severe AR. Estimated AVA 1.64 cm2. Mild to moderate aortic valve stenosis. Of note the patient had bronchospasm and low sats throughout case.   L/RHC showed 3V native CAD with 3/3 patent bypass grafts with normal wedge and right heart pressures as well as normal cardiac output. Angiomax was used in substitution of heparin.    Patient seen in cath lab holding area. He is feeling fine. No chest pain. Breathing much better since recent admission and 20 lbs diuresis. No LE edema, orthopnea or PND. No dizziness or syncope. No blood in stool or urine. No palpitations. Reports having all native teeth with regular dental work.               Past Medical History:  Diagnosis Date   Alcohol abuse     Anxiety     Aortic valve disease     Arthritis     Coronary artery disease     Glaucoma     Hypertension     OSA (obstructive sleep apnea)     Pre-diabetes     Shingles                 Past Surgical History:  Procedure Laterality Date   AORTIC VALVE REPLACEMENT N/A 03/06/2019    Procedure: AORTIC VALVE REPLACEMENT (AVR), USING INTUITY 25MM;  Surgeon: Gregory Olds, MD;  Location: [place];  Service:  Open Heart Surgery;  Laterality: N/A;   CARDIAC CATHETERIZATION       CATARACT EXTRACTION       CORONARY ARTERY BYPASS GRAFT N/A 03/06/2019    Procedure: CORONARY ARTERY BYPASS GRAFTING (CABG), ON PUMP, TIMES THREE, USING BILATERAL INTERNAL MAMMARIES AND LEFT RADIAL ARTERY HARVEST;  Surgeon: Gregory Olds, MD;  Location: [place];  Service: Open Heart Surgery;  Laterality: N/A;  BILATERAL IMA   LEFT HEART CATH AND CORONARY ANGIOGRAPHY N/A 03/04/2019    Procedure: LEFT HEART CATH AND CORONARY ANGIOGRAPHY;  Surgeon: Burnell Blanks, MD;  Location: [place] [facility];  Service: Cardiovascular;  Laterality: N/A;   None       RADIAL ARTERY HARVEST Left 03/06/2019    Procedure: RADIAL ARTERY HARVEST;  Surgeon: Gregory Olds, MD;  Location: [place];  Service: Open Heart Surgery;  Laterality: Left;   TEE WITHOUT CARDIOVERSION N/A 03/06/2019    Procedure: TRANSESOPHAGEAL ECHOCARDIOGRAM (TEE);  Surgeon: Gregory Olds, MD;  Location: [place];  Service: Open  Heart Surgery;  Laterality: N/A;      Home Medications:               Prior to Admission medications   Medication Sig Start Date End Date Taking? Authorizing Provider  acetaminophen (TYLENOL) 500 MG tablet Take 1,000 mg by mouth every 6 (six) hours as needed for moderate pain.     Yes [provider]  allopurinol (ZYLOPRIM) 100 MG tablet Take 1 tablet (100 mg total) by mouth daily. 03/18/20   Yes Bedsole, Amy E, MD  ALPRAZolam (XANAX) 0.5 MG tablet TAKE 1 TABLET BY MOUTH ONCE DAILY AS NEEDED FOR ANXIETY TAKE  30  MINUTES  PRIOR  TO  PLANE  FLIGHT  OR  AS  NEEDED  FOR  ANXIETY 10/26/19   Yes Bedsole, Amy E, MD  aspirin EC 81 MG tablet Take 1 tablet (81 mg total) by mouth daily. Swallow whole. 08/06/19   Yes Gregory Breeding, MD  atorvastatin (LIPITOR) 80 MG tablet Take 1 tablet (80 mg total) by mouth daily. 12/22/19   Yes Bedsole, Amy E, MD  colchicine 0.6 MG tablet Take 2 tablets by mouth once daily Patient taking differently: Take 0.3 mg  by mouth daily. Take 1 tablet (0.3 mg) by mouth scheduled every day, may take 1 additional tablet if needed for gout flare 01/08/20   Yes Bedsole, Amy E, MD  furosemide (LASIX) 20 MG tablet Take 2 tablets in the morning and 1 tablet in the afternoon 10/02/20   Yes Almyra Deforest, PA  ketoconazole (NIZORAL) 2 % shampoo Apply 1 application topically 2 (two) times a week. Patient taking differently: Apply 1 application topically once a week. 05/09/20   Yes Bedsole, Amy E, MD  latanoprost (XALATAN) 0.005 % ophthalmic solution Place 1 drop into both eyes every morning.  10/25/18   Yes [provider]  Misc Natural Products (OSTEO BI-FLEX/5-LOXIN ADVANCED) TABS Take 2 tablets by mouth daily.     Yes [provider]  potassium chloride SA (KLOR-CON) 20 MEQ tablet Take 1 tablet (20 mEq total) by mouth daily. 10/03/20   Yes Meng, Isaac Laud, PA  sacubitril-valsartan (ENTRESTO) 49-51 MG Take 1 tablet by mouth 2 (two) times daily. 10/02/20   Yes Almyra Deforest, PA  sertraline (ZOLOFT) 50 MG tablet Take 1 tablet (50 mg total) by mouth daily. 12/22/19   Yes Bedsole, Amy E, MD  spironolactone (ALDACTONE) 25 MG tablet Take 1 tablet (25 mg total) by mouth daily. 10/03/20   Yes Almyra Deforest, PA  traZODone (DESYREL) 50 MG tablet Take 0.5-1 tablets (25-50 mg total) by mouth at bedtime as needed for sleep. Patient taking differently: Take 50 mg by mouth at bedtime as needed for sleep. 12/22/19   Yes Bedsole, Amy E, MD      Inpatient Medications: Scheduled Meds:   Continuous Infusions:   PRN Meds:     Allergies:             Allergies  Allergen Reactions   Heparin        HIT antibody and SRA positive      Social History:   Social History             Socioeconomic History   Marital status: Married      Spouse name: Cindi   Number of children: 3   Years of education: Not on file   Highest education level: Not on file  Occupational History   Occupation: Architect  Tobacco Use   Smoking status: Never  Smokeless tobacco: Never  Vaping Use   Vaping Use: Never used  Substance and Sexual Activity   Alcohol use: Yes      Alcohol/week: 12.0 standard drinks      Types: 12 Cans of beer per week   Drug use: Yes      Frequency: 2.0 times per week      Types: Marijuana      Comment: gummies   Sexual activity: Not on file  Other Topics Concern   Not on file  Social History Narrative    Lives at home with wife.    Social Determinants of Health         Financial Resource Strain: Low Risk    Difficulty of Paying Living Expenses: Not hard at all  Food Insecurity: No Food Insecurity   Worried About Charity fundraiser in the Last Year: Never true   [place] in the Last Year: Never true  Transportation Needs: No Transportation Needs   Lack of Transportation (Medical): No   Lack of Transportation (Non-Medical): No  Physical Activity: Inactive   Days of Exercise per Week: 0 days   Minutes of Exercise per Session: 0 min  Stress: Stress Concern Present   Feeling of Stress : To some extent  Social Connections: Not on file  Intimate Partner Violence: Not At Risk   Fear of Current or Ex-Partner: No   Emotionally Abused: No   Physically Abused: No   Sexually Abused: No    Family History:              Family History  Adopted: Yes  Problem Relation Age of Onset   Cancer Mother          breast cancer   Alcohol abuse Father     Breast cancer Sister        ROS:  Please see the history of present illness.  All other ROS reviewed and negative.      Physical Exam/Data:           Vitals:    10/10/20 1147 10/10/20 1410  BP: 122/65    Resp: 20    Temp: 98.1 F (36.7 C)    TempSrc: Temporal    SpO2: 97% 93%  Weight: 93.4 kg    Height: 5\' 7"  (1.702 m)        Intake/Output Summary (Last 24 hours) at 10/10/2020 1503 Last data filed at 10/10/2020 1314      Gross per 24 hour  Intake 200 ml  Output --  Net 200 ml    Last 3 Weights 10/10/2020 10/06/2020 09/29/2020  Weight  (lbs) 206 lb 217 lb 6.4 oz 225 lb 15.5 oz  Weight (kg) 93.441 kg 98.612 kg 102.5 kg     Body mass index is 32.26 kg/m.  General:  Well nourished, well developed, in no acute distress HEENT: normal Lymph: no adenopathy Neck: no JVD Endocrine:  No thryomegaly Vascular: No carotid bruits; FA pulses 2+ bilaterally without bruits  Cardiac:  normal S1, S2; RRR; systolic and soft diastolic murmur Lungs:  clear to auscultation bilaterally, no wheezing, rhonchi or rales  Abd: soft, nontender, no hepatomegaly  Ext: no edema Musculoskeletal:  No deformities, BUE and BLE strength normal and equal Skin: warm and dry  Neuro:  CNs 2-12 intact, no focal abnormalities noted Psych:  Normal affect    EKG:  The EKG was personally reviewed and demonstrates:  Sinus rhythm Mobitz II 2-degree AV block, LBBB. HR 38  bpm     Relevant CV Studies: Outpatient monitor 9/12-9/28/22 Predominant rhythm sinus bradycardia High degree heart block Mobitz Type I and II with probable episodes of complete heart block Junctional rhythm   ____________________   Echo 09/29/20 IMPRESSIONS   1. The aortic valve has been Intuity 25 mm valve in the aortic position.  There are two peri-valvular jets that are moderate in severity; in  summation suggestive of severe regurgitation. There is a 25 mm INTUITY  valve present in the aortic position.  Procedure Date: 03/06/2019.   2. Left ventricular ejection fraction, by estimation, is 40 to 45%. The  left ventricle has mildly decreased function. The left ventricle  demonstrates global hypokinesis. The left ventricular internal cavity size  was moderately dilated. There is mild  concentric left ventricular hypertrophy. Left ventricular diastolic  parameters are indeterminate.   3. Right ventricular systolic function is normal. The right ventricular  size is normal. There is normal pulmonary artery systolic pressure.   4. Left atrial size was severely dilated.   5. The mitral  valve is normal in structure. No evidence of mitral valve  regurgitation.   6. Aortic dilatation noted. There is mild dilatation of the aortic root,  measuring 44 mm. There is moderate dilatation of the ascending aorta,  measuring 45 mm.   Comparison(s): A prior study was performed on 12/07/2019. LV function is  slightly worse, LV dilation has dilated. Comparison of the apical 5  chamber view and PSAX view (which does not show both jets) shows worse regurgitation despite being done at a lower   Nyquist limit. In comparison to post operative TEE, perivalvular leak  which was present psot op has worsened.   Conclusion(s)/Recommendation(s): TEE or cardiac MRI could be considered for further quantification of valve dysfunction.    ____________________________   Cardiac CT 09/30/20 IMPRESSION: 1. 25 mm [name] Intuity bioprosthetic valve with 2 identified paravalvular leaks under the RCC and LCC as detailed above.   2. 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   3. S/p CABG.  LIMA-LAD and RIMA-RCA are seen.   4. Severely dilated left ventricular cavity.   5. Dilated pulmonary artery suggestive of pulmonary hypertension.   [place] T. O'Neal, MD   ______________________   CT chest/abdomen/pelvis 09/30/20 VASCULAR MEASUREMENTS PERTINENT TO TAVR:   AORTA:   Minimal Aortic Diameter-22 x 19 mm   Severity of Aortic Calcification-moderate   RIGHT PELVIS:   Right Common Iliac Artery -   Minimal Diameter-11.8 x 13.3 mm   Tortuosity-mild   Calcification-moderate to severe   Right External Iliac Artery -   Minimal Diameter-10.9 x 11.3 mm   Tortuosity-severe   Calcification-none   Right Common Femoral Artery -   Minimal Diameter-11.9 x 10.9 mm   Tortuosity-mild   Calcification-mild   LEFT PELVIS:   Left Common Iliac Artery -   Minimal Diameter-14.8 x 13.9 mm   Tortuosity-mild   Calcification-moderate to severe   Left External Iliac Artery -   Minimal  Diameter-11.2 x 12.0 mm   Tortuosity-moderate   Calcification-none   Left Common Femoral Artery -   Minimal Diameter-10.7 x 10.9 mm   Tortuosity-mild   Calcification-mild   Review of the MIP images confirms the above findings.   IMPRESSION: 1. Vascular findings and measurements pertinent to potential TAVR procedure, as detailed above. 2. Status post aortic valve replacement with what appears to be a stented bioprosthesis. Aneurysmal dilatation of the sinuses of Valsalva (5.0 cm in diameter) and  the ascending thoracic aorta (4.5 cm in diameter). Ascending thoracic aortic aneurysm. Recommend semi-annual imaging followup by CTA or MRA and referral to cardiothoracic surgery if not already obtained. This recommendation follows 2010 ACCF/AHA/AATS/ACR/ASA/SCA/SCAI/SIR/STS/SVM Guidelines for the Diagnosis and Management of Patients With Thoracic Aortic Disease. Circulation. 2010; 121: G401-U272. Aortic aneurysm NOS (ICD10-I71.9). 3. Aortic atherosclerosis, in addition to left main and 3 vessel coronary artery disease. Status post median sternotomy for CABG including LIMA to the LAD. 4. Mild cardiomegaly. 5. Small bilateral pleural effusions lying dependently. 6. Colonic diverticulosis without evidence of acute diverticulitis at this time. 7. Additional incidental findings, as above.   _____________________   TEE 10/10/20 IMPRESSIONS   1. Exam limited by patient bronchospasm and desats with coughing  througout study despite propofol.   2. Left ventricular ejection fraction, by estimation, is 30 to 35%. The  left ventricle has normal function. The left ventricle demonstrates global  hypokinesis. The left ventricular internal cavity size was severely  dilated. Left ventricular diastolic  function could not be evaluated.   3. Right ventricular systolic function is mildly reduced. The right  ventricular size is mildly enlarged.   4. Left atrial size was moderately dilated. No  left atrial/left atrial  appendage thrombus was detected.   5. Right atrial size was mildly dilated.   6. The mitral valve is abnormal. Mild mitral valve regurgitation.   7. 25 mm Intuity hybrid valve. Severe peri annular regurgitatiion seen at  least 3 areas around right and left sinus. Poor stent skirt appostion  below annulus. Also appears to be limited motion of one of the leaflets.  Elevated transgastric gradient mean  39 peak 86 mmHg likely reprsents combination of leaflet dysfunction and  severe AR Estimted AVA 1.64 cm2 . The aortic valve has been  repaired/replaced. Aortic valve regurgitation is severe. Mild to moderate  aortic valve stenosis. There is a 25 mm Intuity  valve present in the aortic position. Procedure Date: 03/06/19.    _________________________   L/RHC 10/11/18: AORTIC ARCH ANGIOGRAPHY  RIGHT HEART CATH AND CORONARY/GRAFT ANGIOGRAPHY    Conclusion       Prox LAD to Mid LAD lesion is 90% stenosed.   Mid LAD-1 lesion is 70% stenosed.   Prox RCA lesion is 70% stenosed.   RV Branch lesion is 80% stenosed.   Prox Cx lesion is 50% stenosed.   Mid LAD-2 lesion is 100% stenosed.   LIMA graft was visualized by angiography and is normal in caliber.   The graft exhibits no disease.     Laboratory Data:   High Sensitivity Troponin:   Last Labs   No results for input(s): TROPONINIHS in the last 720 hours.     Chemistry Last Labs          Recent Labs  Lab 10/06/20 1629 10/10/20 1450  NA 141 138  K 5.2 4.8  CL 103  --   CO2 23  --   GLUCOSE 105*  --   BUN 29*  --   CREATININE 1.23  --   CALCIUM 8.8  --       Last Labs   No results for input(s): PROT, ALBUMIN, AST, ALT, ALKPHOS, BILITOT in the last 168 hours.    Hematology    Last Labs           Recent Labs  Lab 10/06/20 1629 10/10/20 1450  WBC 5.8  --   RBC 3.72*  --   HGB 10.2* 10.5*  HCT 32.2* 31.0*  MCV 87  --   MCH 27.4  --   MCHC 31.7  --   RDW 16.7*  --   PLT 220  --        BNP Last Labs   No results for input(s): BNP, PROBNP in the last 168 hours.    DDimer  Last Labs   No results for input(s): DDIMER in the last 168 hours.        Radiology/Studies:  ECHO TEE   Result Date: 10/10/2020    TRANSESOPHOGEAL ECHO REPORT   Patient Name:   Gregory [name] Date of Exam: 10/10/2020 Medical Rec #:  [id number]         Height:       67.0 in Accession #:    [id number]        Weight:       217.4 lb Date of Birth:  February 08, 1950         BSA:          2.095 m Patient Age:    34 years          BP:           122/65 mmHg Patient Gender: M                 HR:           80 bpm. Exam Location:  Inpatient Procedure: 3D Echo, Transesophageal Echo, Cardiac Doppler and Color Doppler Indications:     I35.1 Nonrheumatic aortic (valve) insufficiency  History:         Patient has prior history of Echocardiogram examinations. CHF                  and Cardiomyopathy, Previous Myocardial Infarction, Prior CABG                  and Abnormal ECG, Aortic Valve Disease, Arrythmias:Atrial                  Fibrillation, Signs/Symptoms:Murmur; Risk Factors:Dyslipidemia,                  Hypertension and Sleep Apnea.                  Aortic Valve: 25 mm Intuity valve is present in the aortic                  position. Procedure Date: 03/06/19.  Sonographer:     Roseanna Rainbow RDCS Referring Phys:  [place] Diagnosing Phys: Jenkins Rouge MD PROCEDURE: After discussion of the risks and benefits of a TEE, an informed consent was obtained from the patient. The transesophogeal probe was passed without difficulty through the esophogus of the patient. Imaged were obtained with the patient in a left lateral decubitus position. Sedation performed by performing physician. The patient was monitored while under deep sedation. Anesthestetic sedation was provided intravenously by Anesthesiology: 264mg  of Propofol. The patient developed Respiratory depression during the procedure. IMPRESSIONS  1. Exam limited by patient  bronchospasm and desats with coughing througout study despite propofol.  2. Left ventricular ejection fraction, by estimation, is 30 to 35%. The left ventricle has normal function. The left ventricle demonstrates global hypokinesis. The left ventricular internal cavity size was severely dilated. Left ventricular diastolic function could not be evaluated.  3. Right ventricular systolic function is mildly reduced. The right ventricular size is mildly enlarged.  4. Left atrial size was moderately dilated. No left atrial/left atrial appendage thrombus was  detected.  5. Right atrial size was mildly dilated.  6. The mitral valve is abnormal. Mild mitral valve regurgitation.  7. 25 mm Intuity hybrid valve. Severe peri annular regurgitatiion seen at least 3 areas around right and left sinus. Poor stent skirt appostion below annulus. Also appears to be limited motion of one of the leaflets. Elevated transgastric gradient mean 39 peak 86 mmHg likely reprsents combination of leaflet dysfunction and severe AR Estimted AVA 1.64 cm2 . The aortic valve has been repaired/replaced. Aortic valve regurgitation is severe. Mild to moderate aortic valve stenosis. There is a 25 mm Intuity valve present in the aortic position. Procedure Date: 03/06/19. FINDINGS  Left Ventricle: Left ventricular ejection fraction, by estimation, is 30 to 35%. The left ventricle has normal function. The left ventricle demonstrates global hypokinesis. The left ventricular internal cavity size was severely dilated. Left ventricular  diastolic function could not be evaluated. Right Ventricle: The right ventricular size is mildly enlarged. Right vetricular wall thickness was not assessed. Right ventricular systolic function is mildly reduced. Left Atrium: Left atrial size was moderately dilated. No left atrial/left atrial appendage thrombus was detected. Right Atrium: Right atrial size was mildly dilated. Pericardium: There is no evidence of pericardial effusion.  Mitral Valve: The mitral valve is abnormal. Mild mitral valve regurgitation. Tricuspid Valve: The tricuspid valve is normal in structure. Tricuspid valve regurgitation is mild. Aortic Valve: 25 mm Intuity hybrid valve. Severe peri annular regurgitatiion seen at least 3 areas around right and left sinus. Poor stent skirt appostion below annulus. Also appears to be limited motion of one of the leaflets. Elevated transgastric gradient mean 39 peak 86 mmHg likely reprsents combination of leaflet dysfunction and severe AR Estimted AVA 1.64 cm2. The aortic valve has been repaired/replaced. Aortic valve regurgitation is severe. Mild to moderate aortic stenosis is present. Aortic valve mean gradient measures 39.0 mmHg. Aortic valve peak gradient measures 85.4 mmHg. Aortic valve area, by VTI measures 1.32 cm. There is a 25 mm Intuity valve present in the aortic position. Procedure Date: 03/06/19. Pulmonic Valve: The pulmonic valve was grossly normal. Pulmonic valve regurgitation is mild. Aorta: The aortic root was not well visualized. IAS/Shunts: No atrial level shunt detected by color flow Doppler. Additional Comments: Exam limited by patient bronchospasm and desats with coughing througout study despite propofol.  LEFT VENTRICLE PLAX 2D LVOT diam:     2.19 cm LV SV:         135 LV SV Index:   64 LVOT Area:     3.77 cm  AORTIC VALVE AV Area (Vmax):    1.64 cm AV Area (Vmean):   1.77 cm AV Area (VTI):     1.32 cm AV Vmax:           462.00 cm/s AV Vmean:          285.000 cm/s AV VTI:            1.020 m AV Peak Grad:      85.4 mmHg AV Mean Grad:      39.0 mmHg LVOT Vmax:         201.00 cm/s LVOT Vmean:        134.000 cm/s LVOT VTI:          0.357 m LVOT/AV VTI ratio: 0.35  AORTA Ao Asc diam: 3.59 cm  SHUNTS Systemic VTI:  0.36 m Systemic Diam: 2.19 cm Jenkins Rouge MD Electronically signed by Jenkins Rouge MD Signature Date/Time: 10/10/2020/2:51:13 PM    Final  STS Risk Calculator: Procedure: Isolated AVR Risk of  Mortality: 2.512% Renal Failure: 4.788% Permanent Stroke: 0.834% Prolonged Ventilation: 11.930% DSW Infection: 0.356% Reoperation: 6.136% Morbidity or Mortality: 15.608% Short Length of Stay: 26.343% Long Length of Stay: 6.105%     _____________________   [facility] Cardiomyopathy Questionnaire  KCCQ-12 10/10/2020  1 a. Ability to shower/bathe Slightly limited  1 b. Ability to walk 1 block Extremely limited  1 c. Ability to hurry/jog Other, Did not do  2. Edema feet/ankles/legs 3+ times a week, not every day  3. Limited by fatigue All of the time  4. Limited by dyspnea At least once a day  5. Sitting up / on 3+ pillows Never over the past 2 weeks  6. Limited enjoyment of life Extremely limited  7. Rest of life w/ symptoms Not at all satisfied  8 a. Participation in hobbies Slightly limited  8 b. Participation in chores Moderately limited  8 c. Visiting family/friends N/A, did not do for other reasons        Assessment and Plan:    2nd degree AV block - He has significant conduction system disease and will be undergoing valvuloplasty and either TAVR or SAVR due to perivalvular leak. I have discussed with Dr. Burt [name] who requests a temp/perm PPM insertion with plans for PPM vs ICD after the procedure. I have discussed the indications/risks/benefits/goals/expectations of the procedure with the patient and he wishes to proceed. AI - note findings of perivalvular leak. He is pending valvuloplasty.   Carleene Overlie Shermika Balthaser,MD

## 2020-10-17 NOTE — Progress Notes (Signed)
Blue arm sling placed by EP staff, safety maintained

## 2020-10-17 NOTE — Anesthesia Preprocedure Evaluation (Addendum)
Anesthesia Evaluation  Patient identified by MRN, date of birth, ID band Patient awake    Reviewed: Allergy & Precautions, NPO status , Patient's Chart, lab work & pertinent test results  Airway Mallampati: III  TM Distance: >3 FB Neck ROM: Full    Dental  (+) Teeth Intact, Dental Advisory Given   Pulmonary sleep apnea ,    Pulmonary exam normal breath sounds clear to auscultation       Cardiovascular hypertension, Pt. on medications + CAD, + Past MI and +CHF  Normal cardiovascular exam+ dysrhythmias + Valvular Problems/Murmurs AS  Rhythm:Regular Rate:Normal  TEE 10/10/20: 1. Exam limited by patient bronchospasm and desats with coughing  througout study despite propofol.  2. Left ventricular ejection fraction, by estimation, is 30 to 35%. The  left ventricle has normal function. The left ventricle demonstrates global  hypokinesis. The left ventricular internal cavity size was severely  dilated. Left ventricular diastolic  function could not be evaluated.  3. Right ventricular systolic function is mildly reduced. The right  ventricular size is mildly enlarged.  4. Left atrial size was moderately dilated. No left atrial/left atrial  appendage thrombus was detected.  5. Right atrial size was mildly dilated.  6. The mitral valve is abnormal. Mild mitral valve regurgitation.  7. 25 mm Intuity hybrid valve. Severe peri annular regurgitatiion seen at  least 3 areas around right and left sinus. Poor stent skirt appostion  below annulus. Also appears to be limited motion of one of the leaflets.  Elevated transgastric gradient mean  39 peak 86 mmHg likely reprsents combination of leaflet dysfunction and  severe AR Estimted AVA 1.64 cm2 . The aortic valve has been  repaired/replaced. Aortic valve regurgitation is severe. Mild to moderate  aortic valve stenosis. There is a 25 mm Intuity  valve present in the aortic position.  Procedure Date: 03/06/19.    Neuro/Psych PSYCHIATRIC DISORDERS Anxiety negative neurological ROS     GI/Hepatic negative GI ROS, (+)     substance abuse  alcohol use,   Endo/Other  Obesity   Renal/GU negative Renal ROS     Musculoskeletal  (+) Arthritis ,   Abdominal   Peds  Hematology  (+) Blood dyscrasia, anemia ,   Anesthesia Other Findings   Reproductive/Obstetrics                            Anesthesia Physical Anesthesia Plan  ASA: 4  Anesthesia Plan: MAC and General   Post-op Pain Management:    Induction: Intravenous  PONV Risk Score and Plan: 1 and Midazolam  Airway Management Planned: Natural Airway and Simple Face Mask  Additional Equipment: Arterial line  Intra-op Plan:   Post-operative Plan: Extubation in OR  Informed Consent: I have reviewed the patients History and Physical, chart, labs and discussed the procedure including the risks, benefits and alternatives for the proposed anesthesia with the patient or authorized representative who has indicated his/her understanding and acceptance.     Dental advisory given  Plan Discussed with: CRNA  Anesthesia Plan Comments: (MAC vs GA per surgeon preference.)       Anesthesia Quick Evaluation

## 2020-10-17 NOTE — H&P (View-Only) (Signed)
[address]       [place], [id number]             [phone]      Cardiothoracic Surgery Admission History and Physical   Patient ID: Gregory [name] MRN: [id number]; DOB: Apr 19, 1950   Admit date: 10/10/2020 Date of Consult: 10/10/2020   Primary Care Provider: Jinny Sanders, MD [facility] Cardiologist: [name], MD  [facility] Electrophysiologist:  None      Patient Profile:    Gregory [name] is a 70 y.o. male with a hx of CAD and aortic valve disease s/p CABG/AVR 03/06/2019 with a 25 mm [name] Intuity rapid deployment valve and CABG x 3 with a left radial to PDA, LIMA-LAD, and pedicled RIMA-RPL by Dr. Julien Girt, TAA, post op HIT, HTN, obesity, chronic diastolic CHF with recent admission for acute on chronic diastolic CHF, HAVB, worsening anemia and found to have severe bioprosthetic valve dysfunction with severe AI and hemolytic anemia who is being seen today for the evaluation of bioprosthetic valve dysfunction at the request of [place].   History of Present Illness:    The patient underwent CABG/AVR 03/06/2019 with a 25 mm [name] Intuity rapid deployment valve and CABG x 3 with a left radial to PDA, LIMA-LAD, and pedicled RIMA - RPL. The patient's postoperative course was complicated by heparin-induced thrombocytopenia.  He was ultimately treated with several months of warfarin and recovered uneventfully.   He was in his usual state of health until the last several months when he developed progressive fatigue, exertional dyspnea, fatigue, palpitations as well as anemia.    He was admitted in 09/2020 for 2-1 heart block discovered on an outpatient monitor as well as symptoms of volume overload. During his admission he was diuresed with IV lasix and seen by electrophysiology. Repeat echo showed EF 40-45%, LV dilation, mild LVH, mild aortic root dilatation (4.75mm) and moderate ascending aorta dilation (22mm) as well as severe paravalvular  regurgitation in 2 different areas around the sewing ring of his bovine bioprosthesis. Gated cardiac CT done during this admission showed a 25 mm [name] Intuity bioprosthetic valve is present in the aortic position. The leaflets of the prosthesis are freely mobile without evidence of leaflet thickening or thrombosis. There are 2 areas of paravalvular leak under the RCC and LCC related. The leak under the RCC measures ~3.7 mm x ~8.9 mm. The leak under the [place] is ~3.8 mm x 9.1 mm. The leak under then [facility] is likely related to heavy annular calcifications under the [place]. 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 Sinotubular Junction: 41 mm (double oblique). Ascending Thoracic Aorta: 41 mm (double oblique). Severely dilated left ventricular cavity. CTA of the aorta and iliac vessels demonstrate what appear to be adequate pelvic vascular access to facilitate a transfemoral approach.     The patient was feeling better after diuresis and was ultimately discharge home. Permanent pacing was not recommended given possible need for redo surgery.    He was seen in the office by Dr. Burt Knack on 10/06/20. Dr. Burt Knack felt he needed to undergo further evaluation with a transesophageal echo study to further assess the mechanism of his paravalvular regurgitation.  It appears that there is a gap around both the left coronary cusp and the right coronary cusp of the aortic valve annulus causing to separate leaks, cumulatively resulting in a large leak.  If we treat him percutaneously, we would consider placement of a temp/perm  pacemaker lead followed by balloon valvuloplasty of the surgical sewing ring in order to better expand the surgical prosthesis and potentially seal the paravalvular leaks.  Placement of the temp/perm pacemaker would be considered because the likelihood of complete AV block and pacemaker dependence would be exceedingly high after this procedure.  It is possible that following balloon valvuloplasty, the  patient could need valve in valve TAVR if there is leaflet damage or central regurgitation.  It is also possible that he would need paravalvular leak closure if the balloon valvuloplasty procedure is unsuccessful.  After his valve procedure is completed, he would require permanent pacemaker placement. He also recommended a L/RCH to assess his coronary anatomy and bypass grafts as well as hemodynamics. Of note, NO HEPARIN PRODUCTS TO BE USED GIVEN HISTORY OF HIT.   Most recent labs showed a creat of 1.23, GFR 62, K 5.2, Na 141, Hg 10.2, Haptoglobin <10 and LDH 939. TEE and L/RHC were set up for today.    TEE showed EF 30-35%, mild RV enlargement/dysfunction, severe AI with severe peri annular regurgitation seen at least 3 areas around right and left sinus. Poor stent skirt appostion below annulus. Also, appears to be limited motion of one of the leaflets. Elevated transgastric gradient mean 39 peak 86 mmHg likely reprsents combination of leaflet dysfunction and severe AR. Estimated AVA 1.64 cm2. Mild to moderate aortic valve stenosis. Of note the patient had bronchospasm and low sats throughout case.   L/RHC showed 3V native CAD with 3/3 patent bypass grafts with normal wedge and right heart pressures as well as normal cardiac output. Angiomax was used in substitution of heparin.    Patient seen in cath lab holding area. He is feeling fine. No chest pain. Breathing much better since recent admission and 20 lbs diuresis. No LE edema, orthopnea or PND. No dizziness or syncope. No blood in stool or urine. No palpitations. Reports having all native teeth with regular dental work.            Past Medical History:  Diagnosis Date   Alcohol abuse     Anxiety     Aortic valve disease     Arthritis     Coronary artery disease     Glaucoma     Hypertension     OSA (obstructive sleep apnea)     Pre-diabetes     Shingles             Past Surgical History:  Procedure Laterality Date   AORTIC VALVE  REPLACEMENT N/A 03/06/2019    Procedure: AORTIC VALVE REPLACEMENT (AVR), USING INTUITY 25MM;  Surgeon: Wonda Olds, MD;  Location: [place];  Service: Open Heart Surgery;  Laterality: N/A;   CARDIAC CATHETERIZATION       CATARACT EXTRACTION       CORONARY ARTERY BYPASS GRAFT N/A 03/06/2019    Procedure: CORONARY ARTERY BYPASS GRAFTING (CABG), ON PUMP, TIMES THREE, USING BILATERAL INTERNAL MAMMARIES AND LEFT RADIAL ARTERY HARVEST;  Surgeon: Wonda Olds, MD;  Location: [place];  Service: Open Heart Surgery;  Laterality: N/A;  BILATERAL IMA   LEFT HEART CATH AND CORONARY ANGIOGRAPHY N/A 03/04/2019    Procedure: LEFT HEART CATH AND CORONARY ANGIOGRAPHY;  Surgeon: Burnell Blanks, MD;  Location: [place] CV [facility];  Service: Cardiovascular;  Laterality: N/A;   None       RADIAL ARTERY HARVEST Left 03/06/2019    Procedure: RADIAL ARTERY HARVEST;  Surgeon: Wonda Olds, MD;  Location: [facility]  OR;  Service: Open Heart Surgery;  Laterality: Left;   TEE WITHOUT CARDIOVERSION N/A 03/06/2019    Procedure: TRANSESOPHAGEAL ECHOCARDIOGRAM (TEE);  Surgeon: Wonda Olds, MD;  Location: [place];  Service: Open Heart Surgery;  Laterality: N/A;      Home Medications:         Prior to Admission medications   Medication Sig Start Date End Date Taking? Authorizing Provider  acetaminophen (TYLENOL) 500 MG tablet Take 1,000 mg by mouth every 6 (six) hours as needed for moderate pain.     Yes [provider]  allopurinol (ZYLOPRIM) 100 MG tablet Take 1 tablet (100 mg total) by mouth daily. 03/18/20   Yes Bedsole, Amy E, MD  ALPRAZolam (XANAX) 0.5 MG tablet TAKE 1 TABLET BY MOUTH ONCE DAILY AS NEEDED FOR ANXIETY TAKE  30  MINUTES  PRIOR  TO  PLANE  FLIGHT  OR  AS  NEEDED  FOR  ANXIETY 10/26/19   Yes Bedsole, Amy E, MD  aspirin EC 81 MG tablet Take 1 tablet (81 mg total) by mouth daily. Swallow whole. 08/06/19   Yes [name], MD  atorvastatin (LIPITOR) 80 MG tablet Take 1 tablet (80 mg total) by  mouth daily. 12/22/19   Yes Bedsole, Amy E, MD  colchicine 0.6 MG tablet Take 2 tablets by mouth once daily Patient taking differently: Take 0.3 mg by mouth daily. Take 1 tablet (0.3 mg) by mouth scheduled every day, may take 1 additional tablet if needed for gout flare 01/08/20   Yes Bedsole, Amy E, MD  furosemide (LASIX) 20 MG tablet Take 2 tablets in the morning and 1 tablet in the afternoon 10/02/20   Yes Almyra Deforest, PA  ketoconazole (NIZORAL) 2 % shampoo Apply 1 application topically 2 (two) times a week. Patient taking differently: Apply 1 application topically once a week. 05/09/20   Yes Bedsole, Amy E, MD  latanoprost (XALATAN) 0.005 % ophthalmic solution Place 1 drop into both eyes every morning.  10/25/18   Yes [provider]  Misc Natural Products (OSTEO BI-FLEX/5-LOXIN ADVANCED) TABS Take 2 tablets by mouth daily.     Yes [provider]  potassium chloride SA (KLOR-CON) 20 MEQ tablet Take 1 tablet (20 mEq total) by mouth daily. 10/03/20   Yes Meng, Isaac Laud, PA  sacubitril-valsartan (ENTRESTO) 49-51 MG Take 1 tablet by mouth 2 (two) times daily. 10/02/20   Yes Almyra Deforest, PA  sertraline (ZOLOFT) 50 MG tablet Take 1 tablet (50 mg total) by mouth daily. 12/22/19   Yes Bedsole, Amy E, MD  spironolactone (ALDACTONE) 25 MG tablet Take 1 tablet (25 mg total) by mouth daily. 10/03/20   Yes Almyra Deforest, PA  traZODone (DESYREL) 50 MG tablet Take 0.5-1 tablets (25-50 mg total) by mouth at bedtime as needed for sleep. Patient taking differently: Take 50 mg by mouth at bedtime as needed for sleep. 12/22/19   Yes Bedsole, Amy E, MD      Inpatient Medications: Scheduled Meds:   Continuous Infusions:   PRN Meds:     Allergies:         Allergies  Allergen Reactions   Heparin        HIT antibody and SRA positive      Social History:   Social History         Socioeconomic History   Marital status: Married      Spouse name: Cindi   Number of children: 3   Years of education: Not  on file  Highest education level: Not on file  Occupational History   Occupation: Architect  Tobacco Use   Smoking status: Never   Smokeless tobacco: Never  Vaping Use   Vaping Use: Never used  Substance and Sexual Activity   Alcohol use: Yes      Alcohol/week: 12.0 standard drinks      Types: 12 Cans of beer per week   Drug use: Yes      Frequency: 2.0 times per week      Types: Marijuana      Comment: gummies   Sexual activity: Not on file  Other Topics Concern   Not on file  Social History Narrative    Lives at home with wife.    Social Determinants of Health       Financial Resource Strain: Low Risk    Difficulty of Paying Living Expenses: Not hard at all  Food Insecurity: No Food Insecurity   Worried About Charity fundraiser in the Last Year: Never true   [place] in the Last Year: Never true  Transportation Needs: No Transportation Needs   Lack of Transportation (Medical): No   Lack of Transportation (Non-Medical): No  Physical Activity: Inactive   Days of Exercise per Week: 0 days   Minutes of Exercise per Session: 0 min  Stress: Stress Concern Present   Feeling of Stress : To some extent  Social Connections: Not on file  Intimate Partner Violence: Not At Risk   Fear of Current or Ex-Partner: No   Emotionally Abused: No   Physically Abused: No   Sexually Abused: No    Family History:          Family History  Adopted: Yes  Problem Relation Age of Onset   Cancer Mother          breast cancer   Alcohol abuse Father     Breast cancer Sister        ROS:  Please see the history of present illness.  All other ROS reviewed and negative.      Physical Exam/Data:        Vitals:    10/10/20 1147 10/10/20 1410  BP: 122/65    Resp: 20    Temp: 98.1 F (36.7 C)    TempSrc: Temporal    SpO2: 97% 93%  Weight: 93.4 kg    Height: 5\' 7"  (1.702 m)        Intake/Output Summary (Last 24 hours) at 10/10/2020 1503 Last data filed at 10/10/2020  1314    Gross per 24 hour  Intake 200 ml  Output --  Net 200 ml    Last 3 Weights 10/10/2020 10/06/2020 09/29/2020  Weight (lbs) 206 lb 217 lb 6.4 oz 225 lb 15.5 oz  Weight (kg) 93.441 kg 98.612 kg 102.5 kg     Body mass index is 32.26 kg/m.  General:  Well nourished, well developed, in no acute distress HEENT: normal Lymph: no adenopathy Neck: no JVD Endocrine:  No thryomegaly Vascular: No carotid bruits; FA pulses 2+ bilaterally without bruits  Cardiac:  normal S1, S2; RRR; systolic and soft diastolic murmur Lungs:  clear to auscultation bilaterally, no wheezing, rhonchi or rales  Abd: soft, nontender, no hepatomegaly  Ext: no edema Musculoskeletal:  No deformities, BUE and BLE strength normal and equal Skin: warm and dry  Neuro:  CNs 2-12 intact, no focal abnormalities noted Psych:  Normal affect    EKG:  The EKG was personally reviewed and  demonstrates:  Sinus rhythm Mobitz II 2-degree AV block, LBBB. HR 38 bpm     Relevant CV Studies: Outpatient monitor 9/12-9/28/22 Predominant rhythm sinus bradycardia High degree heart block Mobitz Type I and II with probable episodes of complete heart block Junctional rhythm   ____________________   Echo 09/29/20 IMPRESSIONS   1. The aortic valve has been Intuity 25 mm valve in the aortic position.  There are two peri-valvular jets that are moderate in severity; in  summation suggestive of severe regurgitation. There is a 25 mm INTUITY  valve present in the aortic position.  Procedure Date: 03/06/2019.   2. Left ventricular ejection fraction, by estimation, is 40 to 45%. The  left ventricle has mildly decreased function. The left ventricle  demonstrates global hypokinesis. The left ventricular internal cavity size  was moderately dilated. There is mild  concentric left ventricular hypertrophy. Left ventricular diastolic  parameters are indeterminate.   3. Right ventricular systolic function is normal. The right ventricular   size is normal. There is normal pulmonary artery systolic pressure.   4. Left atrial size was severely dilated.   5. The mitral valve is normal in structure. No evidence of mitral valve  regurgitation.   6. Aortic dilatation noted. There is mild dilatation of the aortic root,  measuring 44 mm. There is moderate dilatation of the ascending aorta,  measuring 45 mm.   Comparison(s): A prior study was performed on 12/07/2019. LV function is  slightly worse, LV dilation has dilated. Comparison of the apical 5  chamber view and PSAX view (which does not show both jets) shows worse regurgitation despite being done at a lower   Nyquist limit. In comparison to post operative TEE, perivalvular leak  which was present psot op has worsened.   Conclusion(s)/Recommendation(s): TEE or cardiac MRI could be considered for further quantification of valve dysfunction.    ____________________________   Cardiac CT 09/30/20 IMPRESSION: 1. 25 mm [name] Intuity bioprosthetic valve with 2 identified paravalvular leaks under the RCC and LCC as detailed above.   2. 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   3. S/p CABG.  LIMA-LAD and RIMA-RCA are seen.   4. Severely dilated left ventricular cavity.   5. Dilated pulmonary artery suggestive of pulmonary hypertension.   [place] T. O'Neal, MD   ______________________   CT chest/abdomen/pelvis 09/30/20 VASCULAR MEASUREMENTS PERTINENT TO TAVR:   AORTA:   Minimal Aortic Diameter-22 x 19 mm   Severity of Aortic Calcification-moderate   RIGHT PELVIS:   Right Common Iliac Artery -   Minimal Diameter-11.8 x 13.3 mm   Tortuosity-mild   Calcification-moderate to severe   Right External Iliac Artery -   Minimal Diameter-10.9 x 11.3 mm   Tortuosity-severe   Calcification-none   Right Common Femoral Artery -   Minimal Diameter-11.9 x 10.9 mm   Tortuosity-mild   Calcification-mild   LEFT PELVIS:   Left Common Iliac Artery -    Minimal Diameter-14.8 x 13.9 mm   Tortuosity-mild   Calcification-moderate to severe   Left External Iliac Artery -   Minimal Diameter-11.2 x 12.0 mm   Tortuosity-moderate   Calcification-none   Left Common Femoral Artery -   Minimal Diameter-10.7 x 10.9 mm   Tortuosity-mild   Calcification-mild   Review of the MIP images confirms the above findings.   IMPRESSION: 1. Vascular findings and measurements pertinent to potential TAVR procedure, as detailed above. 2. Status post aortic valve replacement with what appears to be a stented bioprosthesis.  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 Ascending thoracic aortic aneurysm. Recommend semi-annual imaging followup by CTA or MRA and referral to cardiothoracic surgery if not already obtained. This recommendation follows 2010 ACCF/AHA/AATS/ACR/ASA/SCA/SCAI/SIR/STS/SVM Guidelines for the Diagnosis and Management of Patients With Thoracic Aortic Disease. Circulation. 2010; 121: J825-K539. Aortic aneurysm NOS (ICD10-I71.9). 3. Aortic atherosclerosis, in addition to left main and 3 vessel coronary artery disease. Status post median sternotomy for CABG including LIMA to the LAD. 4. Mild cardiomegaly. 5. Small bilateral pleural effusions lying dependently. 6. Colonic diverticulosis without evidence of acute diverticulitis at this time. 7. Additional incidental findings, as above.   _____________________   TEE 10/10/20 IMPRESSIONS   1. Exam limited by patient bronchospasm and desats with coughing  througout study despite propofol.   2. Left ventricular ejection fraction, by estimation, is 30 to 35%. The  left ventricle has normal function. The left ventricle demonstrates global  hypokinesis. The left ventricular internal cavity size was severely  dilated. Left ventricular diastolic  function could not be evaluated.   3. Right ventricular  systolic function is mildly reduced. The right  ventricular size is mildly enlarged.   4. Left atrial size was moderately dilated. No left atrial/left atrial  appendage thrombus was detected.   5. Right atrial size was mildly dilated.   6. The mitral valve is abnormal. Mild mitral valve regurgitation.   7. 25 mm Intuity hybrid valve. Severe peri annular regurgitatiion seen at  least 3 areas around right and left sinus. Poor stent skirt appostion  below annulus. Also appears to be limited motion of one of the leaflets.  Elevated transgastric gradient mean  39 peak 86 mmHg likely reprsents combination of leaflet dysfunction and  severe AR Estimted AVA 1.64 cm2 . The aortic valve has been  repaired/replaced. Aortic valve regurgitation is severe. Mild to moderate  aortic valve stenosis. There is a 25 mm Intuity  valve present in the aortic position. Procedure Date: 03/06/19.    _________________________   L/RHC 10/11/18: AORTIC ARCH ANGIOGRAPHY  RIGHT HEART CATH AND CORONARY/GRAFT ANGIOGRAPHY    Conclusion       Prox LAD to Mid LAD lesion is 90% stenosed.   Mid LAD-1 lesion is 70% stenosed.   Prox RCA lesion is 70% stenosed.   RV Branch lesion is 80% stenosed.   Prox Cx lesion is 50% stenosed.   Mid LAD-2 lesion is 100% stenosed.   LIMA graft was visualized by angiography and is normal in caliber.   The graft exhibits no disease.     Laboratory Data:   High Sensitivity Troponin:   Last Labs   No results for input(s): TROPONINIHS in the last 720 hours.     Chemistry Last Labs       Recent Labs  Lab 10/06/20 1629 10/10/20 1450  NA 141 138  K 5.2 4.8  CL 103  --   CO2 23  --   GLUCOSE 105*  --   BUN 29*  --   CREATININE 1.23  --   CALCIUM 8.8  --       Last Labs   No results for input(s): PROT, ALBUMIN, AST, ALT, ALKPHOS, BILITOT in the last 168 hours.   Hematology Last Labs       Recent Labs  Lab 10/06/20 1629 10/10/20 1450  WBC 5.8  --   RBC 3.72*  --    HGB 10.2* 10.5*  HCT 32.2*  31.0*  MCV 87  --   MCH 27.4  --   MCHC 31.7  --   RDW 16.7*  --   PLT 220  --       BNP Last Labs   No results for input(s): BNP, PROBNP in the last 168 hours.    DDimer  Last Labs   No results for input(s): DDIMER in the last 168 hours.       Radiology/Studies:  ECHO TEE   Result Date: 10/10/2020    TRANSESOPHOGEAL ECHO REPORT   Patient Name:   Gregory [name] Date of Exam: 10/10/2020 Medical Rec #:  [id number]         Height:       67.0 in Accession #:    [id number]        Weight:       217.4 lb Date of Birth:  08-Jan-1950         BSA:          2.095 m Patient Age:    89 years          BP:           122/65 mmHg Patient Gender: M                 HR:           80 bpm. Exam Location:  Inpatient Procedure: 3D Echo, Transesophageal Echo, Cardiac Doppler and Color Doppler Indications:     I35.1 Nonrheumatic aortic (valve) insufficiency  History:         Patient has prior history of Echocardiogram examinations. CHF                  and Cardiomyopathy, Previous Myocardial Infarction, Prior CABG                  and Abnormal ECG, Aortic Valve Disease, Arrythmias:Atrial                  Fibrillation, Signs/Symptoms:Murmur; Risk Factors:Dyslipidemia,                  Hypertension and Sleep Apnea.                  Aortic Valve: 25 mm Intuity valve is present in the aortic                  position. Procedure Date: 03/06/19.  Sonographer:     Roseanna Rainbow RDCS Referring Phys:  [place] Diagnosing Phys: Jenkins Rouge MD PROCEDURE: After discussion of the risks and benefits of a TEE, an informed consent was obtained from the patient. The transesophogeal probe was passed without difficulty through the esophogus of the patient. Imaged were obtained with the patient in a left lateral decubitus position. Sedation performed by performing physician. The patient was monitored while under deep sedation. Anesthestetic sedation was provided intravenously by Anesthesiology: 264mg  of  Propofol. The patient developed Respiratory depression during the procedure. IMPRESSIONS  1. Exam limited by patient bronchospasm and desats with coughing througout study despite propofol.  2. Left ventricular ejection fraction, by estimation, is 30 to 35%. The left ventricle has normal function. The left ventricle demonstrates global hypokinesis. The left ventricular internal cavity size was severely dilated. Left ventricular diastolic function could not be evaluated.  3. Right ventricular systolic function is mildly reduced. The right ventricular size is mildly enlarged.  4. Left atrial size was moderately dilated. No left atrial/left atrial appendage thrombus was  detected.  5. Right atrial size was mildly dilated.  6. The mitral valve is abnormal. Mild mitral valve regurgitation.  7. 25 mm Intuity hybrid valve. Severe peri annular regurgitatiion seen at least 3 areas around right and left sinus. Poor stent skirt appostion below annulus. Also appears to be limited motion of one of the leaflets. Elevated transgastric gradient mean 39 peak 86 mmHg likely reprsents combination of leaflet dysfunction and severe AR Estimted AVA 1.64 cm2 . The aortic valve has been repaired/replaced. Aortic valve regurgitation is severe. Mild to moderate aortic valve stenosis. There is a 25 mm Intuity valve present in the aortic position. Procedure Date: 03/06/19. FINDINGS  Left Ventricle: Left ventricular ejection fraction, by estimation, is 30 to 35%. The left ventricle has normal function. The left ventricle demonstrates global hypokinesis. The left ventricular internal cavity size was severely dilated. Left ventricular  diastolic function could not be evaluated. Right Ventricle: The right ventricular size is mildly enlarged. Right vetricular wall thickness was not assessed. Right ventricular systolic function is mildly reduced. Left Atrium: Left atrial size was moderately dilated. No left atrial/left atrial appendage thrombus was  detected. Right Atrium: Right atrial size was mildly dilated. Pericardium: There is no evidence of pericardial effusion. Mitral Valve: The mitral valve is abnormal. Mild mitral valve regurgitation. Tricuspid Valve: The tricuspid valve is normal in structure. Tricuspid valve regurgitation is mild. Aortic Valve: 25 mm Intuity hybrid valve. Severe peri annular regurgitatiion seen at least 3 areas around right and left sinus. Poor stent skirt appostion below annulus. Also appears to be limited motion of one of the leaflets. Elevated transgastric gradient mean 39 peak 86 mmHg likely reprsents combination of leaflet dysfunction and severe AR Estimted AVA 1.64 cm2. The aortic valve has been repaired/replaced. Aortic valve regurgitation is severe. Mild to moderate aortic stenosis is present. Aortic valve mean gradient measures 39.0 mmHg. Aortic valve peak gradient measures 85.4 mmHg. Aortic valve area, by VTI measures 1.32 cm. There is a 25 mm Intuity valve present in the aortic position. Procedure Date: 03/06/19. Pulmonic Valve: The pulmonic valve was grossly normal. Pulmonic valve regurgitation is mild. Aorta: The aortic root was not well visualized. IAS/Shunts: No atrial level shunt detected by color flow Doppler. Additional Comments: Exam limited by patient bronchospasm and desats with coughing througout study despite propofol.  LEFT VENTRICLE PLAX 2D LVOT diam:     2.19 cm LV SV:         135 LV SV Index:   64 LVOT Area:     3.77 cm  AORTIC VALVE AV Area (Vmax):    1.64 cm AV Area (Vmean):   1.77 cm AV Area (VTI):     1.32 cm AV Vmax:           462.00 cm/s AV Vmean:          285.000 cm/s AV VTI:            1.020 m AV Peak Grad:      85.4 mmHg AV Mean Grad:      39.0 mmHg LVOT Vmax:         201.00 cm/s LVOT Vmean:        134.000 cm/s LVOT VTI:          0.357 m LVOT/AV VTI ratio: 0.35  AORTA Ao Asc diam: 3.59 cm  SHUNTS Systemic VTI:  0.36 m Systemic Diam: 2.19 cm Jenkins Rouge MD Electronically signed by Jenkins Rouge  MD Signature Date/Time: 10/10/2020/2:51:13 PM    Final  STS Risk Calculator: Procedure: Isolated AVR Risk of Mortality: 2.512% Renal Failure: 4.788% Permanent Stroke: 0.834% Prolonged Ventilation: 11.930% DSW Infection: 0.356% Reoperation: 6.136% Morbidity or Mortality: 15.608% Short Length of Stay: 26.343% Long Length of Stay: 6.105%     _____________________   [facility] Cardiomyopathy Questionnaire  KCCQ-12 10/10/2020  1 a. Ability to shower/bathe Slightly limited  1 b. Ability to walk 1 block Extremely limited  1 c. Ability to hurry/jog Other, Did not do  2. Edema feet/ankles/legs 3+ times a week, not every day  3. Limited by fatigue All of the time  4. Limited by dyspnea At least once a day  5. Sitting up / on 3+ pillows Never over the past 2 weeks  6. Limited enjoyment of life Extremely limited  7. Rest of life w/ symptoms Not at all satisfied  8 a. Participation in hobbies Slightly limited  8 b. Participation in chores Moderately limited  8 c. Visiting family/friends N/A, did not do for other reasons        Assessment and Plan:       This 70 year old gentleman is status post CABG x3 with aortic valve replacement using a 25 mm Intuity rapid deployment valve in March 2021.  He recently presented with New York Heart Association class IV symptoms of shortness of breath and marked lower extremity edema with an ejection fraction of 30 to 35% and multiple paravalvular leaks.  His gated cardiac CTA showed that the leaflets were freely mobile without evidence of thickening or thrombosis although there was some suggestion on TEE today that one of the leaflets was not moving normally.  There are 2 jets of paravalvular leakage seen on CTA under the RCC and LCC.  There were 3 jets seen today by TEE.  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  Cardiac catheterization today showed patent bypass grafts with a PA  pressure of 35/15 and wedge pressure of 10.  Right atrial pressure is 5.  PA sat was 60% with a cardiac index of 6.4 by Fick and 3.2 by thermodilution.  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  He has a history of HIT and received Angiomax instead of heparin for his procedure today.  I think his operative risk for redo sternotomy and redo aortic valve replacement would be significantly higher than that estimated by the STS risk profile especially considering his aortic root aneurysm, history of HIT preventing use of heparin, and patent bilateral internal mammary artery grafts with a radial artery graft coming off of the right internal mammary artery graft.  I think the best option for treating him will be balloon dilatation of the Intuity valve to try to get better apposition of the stent cuff to the annulus.  This could result in valve dysfunction and central aortic insufficiency that would require urgent valve in valve TAVR.  I discussed all this with the patient and his wife by telephone and all of their questions have been answered.      Gaye Pollack, MD

## 2020-10-18 ENCOUNTER — Inpatient Hospital Stay (HOSPITAL_COMMUNITY): Payer: Medicare HMO | Admitting: Certified Registered"

## 2020-10-18 ENCOUNTER — Other Ambulatory Visit: Payer: Self-pay | Admitting: Cardiology

## 2020-10-18 ENCOUNTER — Ambulatory Visit: Payer: Medicare HMO | Admitting: Gastroenterology

## 2020-10-18 ENCOUNTER — Inpatient Hospital Stay (HOSPITAL_COMMUNITY): Payer: Medicare HMO

## 2020-10-18 ENCOUNTER — Other Ambulatory Visit: Payer: Self-pay

## 2020-10-18 ENCOUNTER — Encounter (HOSPITAL_COMMUNITY): Payer: Self-pay | Admitting: Internal Medicine

## 2020-10-18 ENCOUNTER — Inpatient Hospital Stay (HOSPITAL_COMMUNITY): Admission: RE | Admit: 2020-10-18 | Payer: Medicare HMO | Source: Ambulatory Visit | Admitting: Cardiovascular Disease

## 2020-10-18 ENCOUNTER — Encounter (HOSPITAL_COMMUNITY)
Admission: AD | Disposition: A | Discharge status: Home / Self Care | Payer: Self-pay | Source: Ambulatory Visit | Attending: Internal Medicine

## 2020-10-18 DIAGNOSIS — Z9889 Other specified postprocedural states: Secondary | ICD-10-CM

## 2020-10-18 DIAGNOSIS — I351 Nonrheumatic aortic (valve) insufficiency: Secondary | ICD-10-CM

## 2020-10-18 DIAGNOSIS — T8203XA Leakage of heart valve prosthesis, initial encounter: Secondary | ICD-10-CM

## 2020-10-18 HISTORY — PX: TEE WITHOUT CARDIOVERSION: SHX5443

## 2020-10-18 HISTORY — DX: Other specified postprocedural states: Z98.890

## 2020-10-18 HISTORY — PX: BALLOON AORTIC VALVE VALVULOPLASTY: SHX6412

## 2020-10-18 LAB — BASIC METABOLIC PANEL
Anion gap: 9 (ref 5–15)
BUN: 21 mg/dL (ref 8–23)
CO2: 21 mmol/L — ABNORMAL LOW (ref 22–32)
Calcium: 8.8 mg/dL — ABNORMAL LOW (ref 8.9–10.3)
Chloride: 108 mmol/L (ref 98–111)
Creatinine, Ser: 0.86 mg/dL (ref 0.61–1.24)
GFR, Estimated: >60 mL/min (ref >60)
Glucose, Bld: 89 mg/dL (ref 70–99)
Potassium: 4.4 mmol/L (ref 3.5–5.1)
Sodium: 138 mmol/L (ref 135–145)

## 2020-10-18 LAB — POCT I-STAT, CHEM 8
BUN: 19 mg/dL (ref 8–23)
BUN: 19 mg/dL (ref 8–23)
Calcium, Ion: 1.25 mmol/L (ref 1.15–1.40)
Calcium, Ion: 1.32 mmol/L (ref 1.15–1.40)
Chloride: 106 mmol/L (ref 98–111)
Chloride: 106 mmol/L (ref 98–111)
Creatinine, Ser: 0.7 mg/dL (ref 0.61–1.24)
Creatinine, Ser: 0.7 mg/dL (ref 0.61–1.24)
Glucose, Bld: 126 mg/dL — ABNORMAL HIGH (ref 70–99)
Glucose, Bld: 131 mg/dL — ABNORMAL HIGH (ref 70–99)
HCT: 27 % — ABNORMAL LOW (ref 39.0–52.0)
HCT: 29 % — ABNORMAL LOW (ref 39.0–52.0)
Hemoglobin: 9.2 g/dL — ABNORMAL LOW (ref 13.0–17.0)
Hemoglobin: 9.9 g/dL — ABNORMAL LOW (ref 13.0–17.0)
Potassium: 4.2 mmol/L (ref 3.5–5.1)
Potassium: 4.7 mmol/L (ref 3.5–5.1)
Sodium: 138 mmol/L (ref 135–145)
Sodium: 138 mmol/L (ref 135–145)
TCO2: 24 mmol/L (ref 22–32)
TCO2: 24 mmol/L (ref 22–32)

## 2020-10-18 LAB — POCT I-STAT 7, (LYTES, BLD GAS, ICA,H+H)
Acid-base deficit: 3 mmol/L — ABNORMAL HIGH (ref 0.0–2.0)
Bicarbonate: 23.6 mmol/L (ref 20.0–28.0)
Calcium, Ion: 1.26 mmol/L (ref 1.15–1.40)
HCT: 29 % — ABNORMAL LOW (ref 39.0–52.0)
Hemoglobin: 9.9 g/dL — ABNORMAL LOW (ref 13.0–17.0)
O2 Saturation: 99 %
Potassium: 4.3 mmol/L (ref 3.5–5.1)
Sodium: 139 mmol/L (ref 135–145)
TCO2: 25 mmol/L (ref 22–32)
pCO2 arterial: 46.1 mmHg (ref 32.0–48.0)
pH, Arterial: 7.318 — ABNORMAL LOW (ref 7.350–7.450)
pO2, Arterial: 177 mmHg — ABNORMAL HIGH (ref 83.0–108.0)

## 2020-10-18 LAB — HEMOGLOBIN A1C
Hgb A1c MFr Bld: 4.5 % — ABNORMAL LOW (ref 4.8–5.6)
Mean Plasma Glucose: 82.45 mg/dL

## 2020-10-18 LAB — CBC
HCT: 29.9 % — ABNORMAL LOW (ref 39.0–52.0)
Hemoglobin: 9.1 g/dL — ABNORMAL LOW (ref 13.0–17.0)
MCH: 29.3 pg (ref 26.0–34.0)
MCHC: 30.4 g/dL (ref 30.0–36.0)
MCV: 96.1 fL (ref 80.0–100.0)
Platelets: 134 10*3/uL — ABNORMAL LOW (ref 150–400)
RBC: 3.11 MIL/uL — ABNORMAL LOW (ref 4.22–5.81)
RDW: 19.1 % — ABNORMAL HIGH (ref 11.5–15.5)
WBC: 4.6 10*3/uL (ref 4.0–10.5)
nRBC: 0 % (ref 0.0–0.2)

## 2020-10-18 LAB — ECHO TEE
AR max vel: 2.81 cm2
AV Area VTI: 2.72 cm2
AV Area mean vel: 2.54 cm2
AV Mean grad: 10 mmHg
AV Peak grad: 20.3 mmHg
Ao pk vel: 2.25 m/s
Height: 67 in
P 1/2 time: 1698 msec
Weight: 3300.8 oz

## 2020-10-18 LAB — PROTIME-INR
INR: 1.1 (ref 0.8–1.2)
Prothrombin Time: 14.2 seconds (ref 11.4–15.2)

## 2020-10-18 SURGERY — VALVULOPLASTY, AORTIC VALVE, USING BALLOON
Anesthesia: General

## 2020-10-18 MED ORDER — CEFAZOLIN SODIUM-DEXTROSE 2-3 GM-%(50ML) IV SOLR
INTRAVENOUS | Status: DC | PRN
  Administered 2020-10-18: 2 g via INTRAVENOUS

## 2020-10-18 MED ORDER — DEXAMETHASONE SODIUM PHOSPHATE 10 MG/ML IJ SOLN
INTRAMUSCULAR | Status: DC | PRN
  Administered 2020-10-18: 10 mg via INTRAVENOUS

## 2020-10-18 MED ORDER — ETOMIDATE 2 MG/ML IV SOLN
INTRAVENOUS | Status: DC | PRN
  Administered 2020-10-18: 18 mg via INTRAVENOUS

## 2020-10-18 MED ORDER — BIVALIRUDIN BOLUS VIA INFUSION
0.1000 mg/kg | Freq: Once | INTRAVENOUS | Status: DC
  Filled 2020-10-18: qty 10

## 2020-10-18 MED ORDER — GLYCOPYRROLATE PF 0.2 MG/ML IJ SOSY
PREFILLED_SYRINGE | INTRAMUSCULAR | Status: DC | PRN
  Administered 2020-10-18: .2 mg via INTRAVENOUS

## 2020-10-18 MED ORDER — FENTANYL CITRATE (PF) 100 MCG/2ML IJ SOLN
INTRAMUSCULAR | Status: DC | PRN
  Administered 2020-10-18: 75 ug via INTRAVENOUS

## 2020-10-18 MED ORDER — DEXMEDETOMIDINE (PRECEDEX) IN NS 20 MCG/5ML (4 MCG/ML) IV SYRINGE: PREFILLED_SYRINGE | INTRAVENOUS | Status: DC | PRN

## 2020-10-18 MED ORDER — 0.9 % SODIUM CHLORIDE (POUR BTL) OPTIME
TOPICAL | Status: DC | PRN
  Administered 2020-10-18: 1000 mL

## 2020-10-18 MED ORDER — ONDANSETRON HCL 4 MG/2ML IJ SOLN
INTRAMUSCULAR | Status: DC | PRN
  Administered 2020-10-18: 4 mg via INTRAVENOUS

## 2020-10-18 MED ORDER — TRAMADOL HCL 50 MG PO TABS: 50.0000 mg | ORAL_TABLET | ORAL | Status: DC | PRN

## 2020-10-18 MED ORDER — SODIUM CHLORIDE 0.9% FLUSH
3.0000 mL | Freq: Two times a day (BID) | INTRAVENOUS | Status: DC
  Administered 2020-10-18 – 2020-10-21 (×5): 3 mL via INTRAVENOUS

## 2020-10-18 MED ORDER — CEFAZOLIN SODIUM-DEXTROSE 2-4 GM/100ML-% IV SOLN
2.0000 g | Freq: Three times a day (TID) | INTRAVENOUS | Status: AC
  Administered 2020-10-18 (×2): 2 g via INTRAVENOUS
  Filled 2020-10-18 (×2): qty 100

## 2020-10-18 MED ORDER — NITROGLYCERIN IN D5W 200-5 MCG/ML-% IV SOLN: 0.0000 ug/min | INTRAVENOUS | Status: DC

## 2020-10-18 MED ORDER — SODIUM CHLORIDE 0.9 % IV SOLN: 250.0000 mL | INTRAVENOUS | Status: DC

## 2020-10-18 MED ORDER — ACETAMINOPHEN 500 MG PO TABS
1000.0000 mg | ORAL_TABLET | Freq: Once | ORAL | Status: AC
  Administered 2020-10-18: 1000 mg via ORAL

## 2020-10-18 MED ORDER — PHENYLEPHRINE HCL-NACL 20-0.9 MG/250ML-% IV SOLN
0.0000 ug/min | INTRAVENOUS | Status: DC
Start: 2020-10-18 — End: 2020-10-18

## 2020-10-18 MED ORDER — CEFAZOLIN SODIUM-DEXTROSE 2-4 GM/100ML-% IV SOLN
INTRAVENOUS | Status: AC
  Filled 2020-10-18: qty 100

## 2020-10-18 MED ORDER — SUGAMMADEX SODIUM 200 MG/2ML IV SOLN
INTRAVENOUS | Status: DC | PRN
  Administered 2020-10-18: 200 mg via INTRAVENOUS

## 2020-10-18 MED ORDER — HEPARIN 6000 UNIT IRRIGATION SOLUTION
Status: AC
  Filled 2020-10-18: qty 1500

## 2020-10-18 MED ORDER — IODIXANOL 320 MG/ML IV SOLN
INTRAVENOUS | Status: DC | PRN
  Administered 2020-10-18: 10 mL via INTRA_ARTERIAL

## 2020-10-18 MED ORDER — LIDOCAINE 2% (20 MG/ML) 5 ML SYRINGE
INTRAMUSCULAR | Status: DC | PRN
  Administered 2020-10-18: 60 mg via INTRAVENOUS
  Administered 2020-10-18: 80 mg via INTRAVENOUS

## 2020-10-18 MED ORDER — CEFAZOLIN SODIUM-DEXTROSE 1-4 GM/50ML-% IV SOLN
1.0000 g | Freq: Once | INTRAVENOUS | Status: DC
  Filled 2020-10-18: qty 50

## 2020-10-18 MED ORDER — CLOPIDOGREL BISULFATE 75 MG PO TABS: 75.0000 mg | ORAL_TABLET | Freq: Every day | ORAL | Status: DC

## 2020-10-18 MED ORDER — PHENYLEPHRINE HCL (PRESSORS) 10 MG/ML IV SOLN
INTRAVENOUS | Status: DC | PRN
  Administered 2020-10-18: 60 ug via INTRAVENOUS
  Administered 2020-10-18: 40 ug via INTRAVENOUS
  Administered 2020-10-18: 60 ug via INTRAVENOUS

## 2020-10-18 MED ORDER — ACETAMINOPHEN 325 MG PO TABS: 650.0000 mg | ORAL_TABLET | Freq: Four times a day (QID) | ORAL | Status: DC | PRN

## 2020-10-18 MED ORDER — SODIUM CHLORIDE 0.9 % IV SOLN: 250.0000 mL | INTRAVENOUS | Status: DC | PRN

## 2020-10-18 MED ORDER — ACETAMINOPHEN 650 MG RE SUPP: 650.0000 mg | Freq: Four times a day (QID) | RECTAL | Status: DC | PRN

## 2020-10-18 MED ORDER — ACETAMINOPHEN 500 MG PO TABS
ORAL_TABLET | ORAL | Status: AC
  Filled 2020-10-18: qty 2

## 2020-10-18 MED ORDER — SODIUM CHLORIDE 0.9 % IV SOLN
1.7500 mg/kg/h | Freq: Once | INTRAVENOUS | Status: DC
Start: 2020-10-18 — End: 2020-10-18
  Filled 2020-10-18: qty 250

## 2020-10-18 MED ORDER — MIDAZOLAM HCL 5 MG/5ML IJ SOLN
INTRAMUSCULAR | Status: DC | PRN
Start: 2020-10-18 — End: 2020-10-18
  Administered 2020-10-18: 1 mg via INTRAVENOUS

## 2020-10-18 MED ORDER — OXYCODONE HCL 5 MG PO TABS
5.0000 mg | ORAL_TABLET | ORAL | Status: DC | PRN
  Administered 2020-10-18 – 2020-10-21 (×4): 10 mg via ORAL
  Filled 2020-10-18 (×4): qty 2

## 2020-10-18 MED ORDER — SODIUM CHLORIDE 0.9 % IV SOLN: INTRAVENOUS | Status: AC

## 2020-10-18 MED ORDER — DEXMEDETOMIDINE HCL IN NACL 400 MCG/100ML IV SOLN
INTRAVENOUS | Status: DC | PRN
Start: 2020-10-18 — End: 2020-10-18
  Administered 2020-10-18: .3 ug/kg/h via INTRAVENOUS

## 2020-10-18 MED ORDER — NOREPINEPHRINE 4 MG/250ML-% IV SOLN
INTRAVENOUS | Status: DC | PRN
  Administered 2020-10-18: 1 ug/min via INTRAVENOUS

## 2020-10-18 MED ORDER — ONDANSETRON HCL 4 MG/2ML IJ SOLN
4.0000 mg | Freq: Four times a day (QID) | INTRAMUSCULAR | Status: DC | PRN
Start: 2020-10-18 — End: 2020-10-21

## 2020-10-18 MED ORDER — SODIUM CHLORIDE 0.9% FLUSH: 3.0000 mL | INTRAVENOUS | Status: DC | PRN

## 2020-10-18 MED ORDER — ROCURONIUM BROMIDE 10 MG/ML (PF) SYRINGE
PREFILLED_SYRINGE | INTRAVENOUS | Status: DC | PRN
  Administered 2020-10-18: 50 mg via INTRAVENOUS
  Administered 2020-10-18: 20 mg via INTRAVENOUS
  Administered 2020-10-18: 30 mg via INTRAVENOUS

## 2020-10-18 MED ORDER — BIVALIRUDIN BOLUS VIA INFUSION - CUPID
INTRAVENOUS | Status: DC | PRN
  Administered 2020-10-18: 70.2 mg via INTRAVENOUS
  Administered 2020-10-18: 1.75 mg/kg/h via INTRAVENOUS

## 2020-10-18 MED ORDER — LACTATED RINGERS IV SOLN: INTRAVENOUS | Status: DC | PRN

## 2020-10-18 MED ORDER — MORPHINE SULFATE (PF) 2 MG/ML IV SOLN
1.0000 mg | INTRAVENOUS | Status: DC | PRN
Start: 2020-10-18 — End: 2020-10-21

## 2020-10-18 MED FILL — Lidocaine HCl Local Inj 1%: INTRAMUSCULAR | Qty: 30 | Status: AC

## 2020-10-18 SURGICAL SUPPLY — 93 items
ADH SKN CLS APL DERMABOND .7 (GAUZE/BANDAGES/DRESSINGS) ×4
APL PRP STRL LF DISP 70% ISPRP (MISCELLANEOUS) ×2
BAG COUNTER SPONGE SURGICOUNT (BAG) ×3 IMPLANT
BAG DECANTER FOR FLEXI CONT (MISCELLANEOUS) IMPLANT
BAG SNAP BAND KOVER 36X36 (MISCELLANEOUS) ×4 IMPLANT
BAG SPNG CNTER NS LX DISP (BAG) ×2
BAG SURGICOUNT SPONGE COUNTING (BAG) ×1
BALLN MUSTANG 12.0X40 75 (BALLOONS) ×3
BALLN MUSTANG 12.0X40 75CM (BALLOONS) ×1
BALLN TRUE 26X4.5 (BALLOONS) ×3
BALLN TRUE 26X4.5CM (BALLOONS) ×1
BALLOON MUSTANG 12.0X40 75 (BALLOONS) ×1 IMPLANT
BALLOON TRUE 26X4.5 (BALLOONS) ×1 IMPLANT
BLADE CLIPPER SURG (BLADE) IMPLANT
BLADE STERNUM SYSTEM 6 (BLADE) IMPLANT
BLADE SURG 10 STRL SS (BLADE) ×3 IMPLANT
BLADE SURG 11 STRL SS (BLADE) ×3 IMPLANT
CABLE ADAPT CONN TEMP 6FT (ADAPTER) ×4 IMPLANT
CANISTER SUCT 3000ML PPV (MISCELLANEOUS) IMPLANT
CATH BEACON 5 .035 40 KMP TP (CATHETERS) ×1 IMPLANT
CATH BEACON 5 .038 40 KMP TP (CATHETERS) ×4
CATH DIAG EXPO 6F AL1 (CATHETERS) IMPLANT
CATH DIAG EXPO 6F VENT PIG 145 (CATHETERS) ×5 IMPLANT
CATH INFINITI 6F AL2 (CATHETERS) ×3 IMPLANT
CATH S G BIP PACING (CATHETERS) ×4 IMPLANT
CHLORAPREP W/TINT 26 (MISCELLANEOUS) ×4 IMPLANT
CLOSURE PERCLOSE PROSTYLE (VASCULAR PRODUCTS) ×15 IMPLANT
CNTNR URN SCR LID CUP LEK RST (MISCELLANEOUS) ×4 IMPLANT
CONT SPEC 4OZ STRL OR WHT (MISCELLANEOUS) ×8
COVER BACK TABLE 80X110 HD (DRAPES) IMPLANT
DECANTER SPIKE VIAL GLASS SM (MISCELLANEOUS) ×4 IMPLANT
DERMABOND ADVANCED (GAUZE/BANDAGES/DRESSINGS) ×4
DERMABOND ADVANCED .7 DNX12 (GAUZE/BANDAGES/DRESSINGS) ×3 IMPLANT
DEVICE ENSNARE  12MMX20MM (VASCULAR PRODUCTS) ×4
DEVICE ENSNARE 12MMX20MM (VASCULAR PRODUCTS) ×1 IMPLANT
DRAPE INCISE IOBAN 66X45 STRL (DRAPES) IMPLANT
DRSG TEGADERM 4X4.75 (GAUZE/BANDAGES/DRESSINGS) ×8 IMPLANT
ELECT CAUTERY BLADE 6.4 (BLADE) IMPLANT
ELECT REM PT RETURN 9FT ADLT (ELECTROSURGICAL) ×8
ELECTRODE REM PT RTRN 9FT ADLT (ELECTROSURGICAL) ×4 IMPLANT
FELT TEFLON 6X6 (MISCELLANEOUS) ×4 IMPLANT
GAUZE SPONGE 4X4 12PLY STRL (GAUZE/BANDAGES/DRESSINGS) ×4 IMPLANT
GLOVE EUDERMIC 7 POWDERFREE (GLOVE) IMPLANT
GLOVE SURG ENC MOIS LTX SZ7.5 (GLOVE) IMPLANT
GLOVE SURG ENC MOIS LTX SZ8 (GLOVE) IMPLANT
GLOVE SURG ORTHO LTX SZ7.5 (GLOVE) IMPLANT
GOWN STRL REUS W/ TWL LRG LVL3 (GOWN DISPOSABLE) ×2 IMPLANT
GOWN STRL REUS W/ TWL XL LVL3 (GOWN DISPOSABLE) ×5 IMPLANT
GOWN STRL REUS W/TWL LRG LVL3 (GOWN DISPOSABLE) ×8
GOWN STRL REUS W/TWL XL LVL3 (GOWN DISPOSABLE) ×16
GUIDEWIRE SAF TJ AMPL .035X180 (WIRE) ×4 IMPLANT
GUIDEWIRE SAFE TJ AMPLATZ EXST (WIRE) ×4 IMPLANT
INSERT FOGARTY SM (MISCELLANEOUS) IMPLANT
KIT BASIN OR (CUSTOM PROCEDURE TRAY) ×4 IMPLANT
KIT HEART LEFT (KITS) ×4 IMPLANT
KIT SUCTION CATH 14FR (SUCTIONS) IMPLANT
KIT TURNOVER KIT B (KITS) ×4 IMPLANT
LOOP VESSEL MAXI BLUE (MISCELLANEOUS) IMPLANT
LOOP VESSEL MINI RED (MISCELLANEOUS) IMPLANT
NS IRRIG 1000ML POUR BTL (IV SOLUTION) ×4 IMPLANT
PACK ENDO MINOR (CUSTOM PROCEDURE TRAY) ×4 IMPLANT
PAD ARMBOARD 7.5X6 YLW CONV (MISCELLANEOUS) ×8 IMPLANT
PAD ELECT DEFIB RADIOL ZOLL (MISCELLANEOUS) ×4 IMPLANT
PENCIL BUTTON HOLSTER BLD 10FT (ELECTRODE) IMPLANT
POSITIONER HEAD DONUT 9IN (MISCELLANEOUS) ×4 IMPLANT
SET MICROPUNCTURE 5F STIFF (MISCELLANEOUS) ×4 IMPLANT
SHEATH BRITE TIP 7FR 35CM (SHEATH) ×4 IMPLANT
SHEATH BRITE TIP 7FRX11 (SHEATH) ×3 IMPLANT
SHEATH PINNACLE 6F 10CM (SHEATH) ×4 IMPLANT
SHEATH PINNACLE 8F 10CM (SHEATH) ×4 IMPLANT
SLEEVE REPOSITIONING LENGTH 30 (MISCELLANEOUS) ×4 IMPLANT
STOPCOCK MORSE 400PSI 3WAY (MISCELLANEOUS) ×8 IMPLANT
SUT ETHIBOND X763 2 0 SH 1 (SUTURE) IMPLANT
SUT GORETEX CV 4 TH 22 36 (SUTURE) IMPLANT
SUT GORETEX CV4 TH-18 (SUTURE) IMPLANT
SUT MNCRL AB 3-0 PS2 18 (SUTURE) IMPLANT
SUT PROLENE 5 0 C 1 36 (SUTURE) IMPLANT
SUT PROLENE 6 0 C 1 30 (SUTURE) IMPLANT
SUT SILK  1 MH (SUTURE) ×4
SUT SILK 1 MH (SUTURE) ×2 IMPLANT
SUT VIC AB 2-0 CT1 27 (SUTURE)
SUT VIC AB 2-0 CT1 TAPERPNT 27 (SUTURE) IMPLANT
SUT VIC AB 2-0 CTX 36 (SUTURE) IMPLANT
SUT VIC AB 3-0 SH 8-18 (SUTURE) IMPLANT
SYR 50ML LL SCALE MARK (SYRINGE) ×7 IMPLANT
SYR BULB IRRIG 60ML STRL (SYRINGE) IMPLANT
SYR MEDRAD MARK V 150ML (SYRINGE) ×4 IMPLANT
TOWEL GREEN STERILE (TOWEL DISPOSABLE) ×8 IMPLANT
TRANSDUCER W/STOPCOCK (MISCELLANEOUS) ×8 IMPLANT
TRAY FOLEY SLVR 14FR TEMP STAT (SET/KITS/TRAYS/PACK) IMPLANT
TUBE SUCT INTRACARD DLP 20F (MISCELLANEOUS) IMPLANT
WIRE EMERALD 3MM-J .035X150CM (WIRE) ×7 IMPLANT
WIRE EMERALD 3MM-J .035X260CM (WIRE) ×7 IMPLANT

## 2020-10-18 NOTE — Interval H&P Note (Signed)
History and Physical Interval Note:  10/18/2020 6:26 AM  Gregory [name]  has presented today for surgery, with the diagnosis of Severe Bioprosthetic Aortic Valve Dysfunction and Severe Paravalvular Aortic Regurgitation.  The various methods of treatment have been discussed with the patient and family. After consideration of risks, benefits and other options for treatment, the patient has consented to  Procedure(s): BALLOON AORTIC VALVE VALVULOPLASTY (N/A) POSSIBLE TRANSCATHETER AORTIC VALVE REPLACEMENT, TRANSFEMORAL (N/A) TRANSESOPHAGEAL ECHOCARDIOGRAM (TEE) (N/A) as a surgical intervention.  The patient's history has been reviewed, patient examined, no change in status, stable for surgery.  I have reviewed the patient's chart and labs.  Questions were answered to the patient's satisfaction.     Gaye Pollack

## 2020-10-18 NOTE — Progress Notes (Signed)
  Echocardiogram Echocardiogram Transesophageal has been performed.  Bobbye Charleston 10/18/2020, 10:07 AM

## 2020-10-18 NOTE — Progress Notes (Signed)
     20 G, R radial arterial line was pulled, and manual pressure was held for 8 min. Sterile dressing was applied at the site.  R radial pulse was 2+, with capillary refill >  3 sec.

## 2020-10-18 NOTE — Transfer of Care (Signed)
Immediate Anesthesia Transfer of Care Note  Patient: Gregory [name]  Procedure(s) Performed: BALLOON AORTIC VALVE VALVULOPLASTY TRANSESOPHAGEAL ECHOCARDIOGRAM (TEE)  Patient Location: PACU and Cath Lab  Anesthesia Type:General  Level of Consciousness: drowsy  Airway & Oxygen Therapy: Patient Spontanous Breathing and Patient connected to face mask oxygen  Post-op Assessment: Report given to RN and Post -op Vital signs reviewed and stable  Post vital signs: Reviewed and stable  Last Vitals:  Vitals Value Taken Time  BP 101/49 10/18/20 1045  Temp 36.4 C 10/18/20 1045  Pulse 60 10/18/20 1045  Resp 12 10/18/20 1045  SpO2 95 % 10/18/20 1043  Vitals shown include unvalidated device data.  Last Pain:  Vitals:   10/18/20 1045  TempSrc: Temporal  PainSc: 0-No pain         Complications: No notable events documented.

## 2020-10-18 NOTE — Op Note (Signed)
[facility]   MULTIDISCIPLINARY HEART VALVE TEAM   TAVR OPERATIVE NOTE   Date of Procedure:  10/18/2020  Preoperative Diagnosis: Severe Aortic Stenosis   Postoperative Diagnosis: Same   Procedure:   Transcatheter Balloon Aortic Valvulplasty - Percutaneous  Transfemoral Approach  Bard True balloon 26 mm   Co-Surgeons:  Gaye Pollack, MD, Lenna Sciara, MD, and Sherren Mocha, MD  Anesthesiologist:  Donnelly Angelica, MD  Echocardiographer:  Marry Guan, MD  Pre-operative Echo Findings: Severe paravalvular aortic insufficiency Moderate global LV systolic dysfunction  Post-operative Echo Findings: Mild residual paravalvular and central aortic insufficiency Unchanged left ventricular systolic function  BRIEF CLINICAL NOTE AND INDICATIONS FOR SURGERY  Complex 70 year old gentleman with severe paravalvular aortic valve insufficiency, hemolysis, and high-grade conduction disease.  He underwent multidisciplinary heart team review and we elected to proceed with temp/perm pacemaker placement followed by balloon valvuloplasty to better expand the patient's surgical bioprosthesis.  The patient has successfully undergone temp/perm pacemaker placement yesterday and today is to undergo balloon aortic valvuloplasty with the possibility of valve in valve TAVR if needed.  During the course of the patient's preoperative work up they have been evaluated comprehensively by a multidisciplinary team of specialists coordinated through the [facility] in the [place] [facility].  They have been demonstrated to suffer from symptomatic severe aortic stenosis as noted above. The patient has been counseled extensively as to the relative risks and benefits of all options for the treatment of severe aortic stenosis including long term medical therapy, conventional surgery for aortic valve replacement, and transcatheter aortic valve replacement.  The  patient has been independently evaluated in formal cardiac surgical consultation by Dr Cyndia Bent, who deemed the patient appropriate for balloon aortic valvuloplasty. Based upon review of all of the patient's preoperative diagnostic tests they are felt to be candidate for transcatheter aortic valve replacement using the transfemoral approach as an alternative to conventional surgery.    Following the decision to proceed with balloon aortic valvuloplasty, a discussion has been held regarding what types of management strategies would be attempted intraoperatively in the event of life-threatening complications, including whether or not the patient would be considered a candidate for the use of cardiopulmonary bypass and/or conversion to open sternotomy for attempted surgical intervention.  The patient has been advised of a variety of complications that might develop peculiar to this approach including but not limited to risks of death, stroke, paravalvular leak, aortic dissection or other major vascular complications, aortic annulus rupture, device embolization, cardiac rupture or perforation, acute myocardial infarction, arrhythmia, heart block or bradycardia requiring permanent pacemaker placement, congestive heart failure, respiratory failure, renal failure, pneumonia, infection, other late complications related to structural valve deterioration or migration, or other complications that might ultimately cause a temporary or permanent loss of functional independence or other long term morbidity.  The patient provides full informed consent for the procedure as described and all questions were answered preoperatively.  DETAILS OF THE OPERATIVE PROCEDURE  PREPARATION:   The patient is brought to the operating room on the above mentioned date and central monitoring was established by the anesthesia team including placement of a radial arterial line. The patient is placed in the supine position on the operating table.   Intravenous antibiotics are administered. General endotracheal anesthesia is induced uneventfully.    Baseline transesophageal echocardiogram is performed. The patient's chest, abdomen, both groins, and both lower extremities are prepared and draped in a sterile manner. A time out procedure  is performed.   PERIPHERAL ACCESS:   Using ultrasound guidance, femoral arterial and venous access is obtained with placement of 6 Fr sheaths on the left side.  [place] images are digitally captured and stored in the patient's chart.   TRANSFEMORAL ACCESS:  A micropuncture technique is used to access the right femoral artery under fluoroscopic and ultrasound guidance.  2 Perclose devices are deployed at 10' and 2' positions to 'PreClose' the femoral artery.  Angiomax was used for anticoagulation as the patient has a history of heparin-induced thrombocytopenia.  An 8 French sheath is placed and then an Amplatz Superstiff wire is advanced through the sheath. This is changed out for a 14 French transfemoral E-Sheath after progressively dilating over the Superstiff wire.  Initially, the valve is crossed with a pigtail catheter and J-wire.  After evaluation, we were able to see that the wire was through the paravalvular area.  Multiple attempts were made and after a trial of a variety of catheters, an AL 2 catheter was able to direct a straight-tip wire through the valve.  This is changed out for a Confida wire which is positioned in the LV apex.  Pacing is performed via the Confida wire.  BALLOON AORTIC VALVULOPLASTY:  The valve is dilated to total of 3 times.  A 26 mm Bard true balloon is used.  Rapid pacing at 180 bpm is performed.  The balloon is dilated to a maximum pressure of 14 atm on the final 2 inflations.  There is improvement in expansion of the surgical bioprosthetic stent frame with repeated balloon inflations.  The patient's hemodynamic recovery following balloon aortic valvuloplasty is good.  Extensive echo  evaluation is performed and at the completion of the procedure there is trivial paravalvular and mild aortic valve insufficiency in total likely a mild degree of aortic insufficiency.  The patient's hemodynamics remained stable.   PROCEDURE COMPLETION:  The sheath was removed and femoral artery closure is performed using the 2 previously deployed Perclose devices.  Perclose was used for the left femoral arteriotomy and venotomy.  Dry closure was performed using a 10 mm Mustang balloon using normal technique.  Final angiography demonstrates patency of the femoral artery with no contrast extravasation.  The patient tolerated the procedure well and is transported to the recovery area in stable condition. There were no immediate intraoperative complications. All sponge instrument and needle counts are verified correct at completion of the operation.   The patient received a total of 10 mL of intravenous contrast during the procedure.  Final conclusion: Successful balloon aortic valvuloplasty reducing severe paravalvular regurgitation to mild central and paravalvular regurgitation at the completion of the procedure.  Sherren Mocha, MD 10/18/2020 10:37 AM

## 2020-10-18 NOTE — Anesthesia Procedure Notes (Signed)
Procedure Name: Intubation Date/Time: 10/18/2020 7:41 AM Performed by: Vonna Drafts, CRNA Pre-anesthesia Checklist: Patient identified, Emergency Drugs available, Suction available and Patient being monitored Patient Re-evaluated:Patient Re-evaluated prior to induction Oxygen Delivery Method: Circle system utilized Preoxygenation: Pre-oxygenation with 100% oxygen Induction Type: IV induction Ventilation: Two handed mask ventilation required Laryngoscope Size: Mac and 4 Grade View: Grade I Tube type: Oral Tube size: 7.5 mm Number of attempts: 1 Airway Equipment and Method: Stylet and Oral airway Placement Confirmation: ETT inserted through vocal cords under direct vision, positive ETCO2 and breath sounds checked- equal and bilateral Secured at: 24 cm Tube secured with: Tape Dental Injury: Teeth and Oropharynx as per pre-operative assessment

## 2020-10-18 NOTE — Progress Notes (Signed)
Mobility Specialist: Progress Note   10/18/20 1653  Mobility  Activity Ambulated in hall  Level of Assistance Standby assist, set-up cues, supervision of patient - no hands on  Assistive Device Front wheel walker  Distance Ambulated (ft) 470 ft  Mobility Ambulated with assistance in hallway  Mobility Response Tolerated well  Mobility performed by Mobility specialist  $Mobility charge 1 Mobility   Pre-Mobility: 60 HR, 92/60 BP, 95% SpO2 Post-Mobility: 60 HR, 118/60 BP, 94% SpO2  Pt c/o 6/10 groin pain during ambulation, otherwise asx. Pt back to bed after walk per request with call bell at his side and family present in the room.   [facility] Ladye Macnaughton Mobility Specialist Mobility Specialist Phone: [phone]

## 2020-10-18 NOTE — Progress Notes (Signed)
Pt arrived to 4e from cath lab. Pt oriented to room and staff. Vitals obtained. Telemetry box applied and CCMD notified x2 verifiers. Bilateral groins assessed. Palpable pedal pulses present. Pt educated on bedrest until 3pm.

## 2020-10-18 NOTE — Anesthesia Procedure Notes (Signed)
Arterial Line Insertion Start/End10/18/2022 7:00 AM, 10/18/2020 7:05 AM Performed by: Catalina Gravel, MD, Vonna Drafts, CRNA, CRNA  Preanesthetic checklist: patient identified, IV checked, site marked, risks and benefits discussed, surgical consent, monitors and equipment checked, pre-op evaluation, timeout performed and anesthesia consent Lidocaine 1% used for infiltration Right, radial was placed Catheter size: 20 G Hand hygiene performed  and maximum sterile barriers used   Attempts: 1 Procedure performed without using ultrasound guided technique. Following insertion, Biopatch. Post procedure assessment: normal  Patient tolerated the procedure well with no immediate complications.

## 2020-10-18 NOTE — Op Note (Signed)
[facility]   MULTIDISCIPLINARY HEART VALVE TEAM   TAVR OPERATIVE NOTE   Date of Procedure:  10/18/2020  Preoperative Diagnosis: Severe peri-prosthetic aortic insufficiency  Postoperative Diagnosis: Same   Procedure:   Transcatheter Balloon Aortic Valvuloplasty- Percutaneous Right Transfemoral Approach  26 mm Bard True Balloon   Co-Surgeons:  Gaye Pollack, MD and Sherren Mocha, MD  and Lenna Sciara, MD   Anesthesiologist:  Chauncey Cruel. Gifford Shave, MD  Echocardiographer:  Marry Guan, MD  Pre-operative Echo Findings: Severe paravalvular insuffiency Moderate left ventricular systolic dysfunction  Post-operative Echo Findings: Mild residual paravalvular leak and mild central aortic valve insufficiency. Unchanged left ventricular systolic dysfunction   BRIEF CLINICAL NOTE AND INDICATIONS FOR SURGERY   This 70 year old gentleman is status post CABG x3 with aortic valve replacement using a 25 mm Intuity rapid deployment valve in March 2021.  He recently presented with New York Heart Association class IV symptoms of shortness of breath and marked lower extremity edema with an ejection fraction of 30 to 35% and multiple paravalvular leaks.  His gated cardiac CTA showed that the leaflets were freely mobile without evidence of thickening or thrombosis although there was some suggestion on TEE today that one of the leaflets was not moving normally.  There are 2 jets of paravalvular leakage seen on CTA under the RCC and LCC.  There were 3 jets seen today by TEE.  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  Cardiac catheterization today showed patent bypass grafts with a PA pressure of 35/15 and wedge pressure of 10.  Right atrial pressure is 5.  PA sat was 60% with a cardiac index of 6.4 by Fick and 3.2 by thermodilution.  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  He has a  history of HIT and received Angiomax instead of heparin for his procedure today.  I think his operative risk for redo sternotomy and redo aortic valve replacement would be significantly higher than that estimated by the STS risk profile especially considering his aortic root aneurysm, history of HIT preventing use of heparin, and patent bilateral internal mammary artery grafts with a radial artery graft coming off of the right internal mammary artery graft.  I think the best option for treating him will be balloon dilatation of the Intuity valve to try to get better apposition of the stent cuff to the annulus.  This could result in valve dysfunction and central aortic insufficiency that would require urgent valve in valve TAVR.  I discussed all this with the patient and his wife by telephone and all of their questions have been answered.       DETAILS OF THE OPERATIVE PROCEDURE  PREPARATION:    The patient was brought to the operating room on the above mentioned date and appropriate monitoring was established by the anesthesia team. The patient was placed in the supine position on the operating table.  Intravenous antibiotics were administered.  General endotracheal anesthesia was induced uneventfully.    Baseline transesophageal  echocardiogram was performed. The patient's chest, abdomen and both groins were prepped and draped in a sterile manner. A time out procedure was performed.   PERIPHERAL ACCESS:    Using the modified Seldinger technique, femoral arterial and venous access was obtained with placement of 6 Fr sheaths on the left side.     TRANSFEMORAL ACCESS:   Percutaneous transfemoral access and sheath  placement was performed using ultrasound guidance.  The right common femoral artery was cannulated using a micropuncture needle and appropriate location was verified using hand injection angiogram.  A pair of Abbott Perclose percutaneous closure devices were placed and a 6 French sheath  replaced into the femoral artery.  The patient was given a loading dose of Angiomax due to history of HITT and ACT verified > 250 seconds.    A 14 Fr transfemoral E-sheath was introduced into the right common femoral artery after progressively dilating over an Amplatz superstiff wire. An AL-2 catheter was used to direct a straight-tip exchange length wire across the native aortic valve into the left ventricle. This was exchanged out for a pigtail catheter and position was confirmed in the LV apex. The pigtail catheter was exchanged for a Confida wire in the LV apex.  Echocardiography was utilized to confirm appropriate wire position and no sign of entanglement in the mitral subvalvular apparatus. Rapid LV pacing was performed through the Confida wire and acceptable pacing threshold was achieved.   BALLOON AORTIC VALVULOPLASTY:   Balloon aortic valvuloplasty was performed using a 26 mm True valvuloplasty balloon.  Once optimal position was achieved, BAV was done under rapid ventricular pacing. The patient recovered well hemodynamically. The valve was dilated 3 times. Echo assessment at the completion of the procedure showed mild residual paravalvular leak and mild central AI.     PROCEDURE COMPLETION:   A 10 mm Mustang balloon was inserted through the left femoral sheath and positioned in the right external iliac artery. It was inflated to temporarily occlude blood flow and the right femoral sheath was removed and femoral artery closure performed using the Perclose devices.  The Mustang balloon was deflated and an arteriogram performed showing patency of the right iliac and femoral arteries with no contrast extravasation. The balloon was removed.  Protamine was administered once femoral arterial repair was complete. A Perclose was utilized following removal of the diagnostic sheath in the left femoral artery and vein.   The patient tolerated the procedure well and is transported to the cath lab  recovery area in stable condition. There were no immediate intraoperative complications. All sponge instrument and needle counts are verified correct at completion of the operation.   No blood products were administered during the operation.  The patient received a total of 10 mL of intravenous contrast during the procedure.   Gaye Pollack, MD 10/18/2020

## 2020-10-18 NOTE — Progress Notes (Signed)
Glasses in a labeled patient belonging bag and taken to PACU.

## 2020-10-18 NOTE — Progress Notes (Signed)
  [facility] VALVE TEAM  Patient doing well s/p transcatheter valvulplasty. He is hemodynamically stable. Groin site stable. ECG with no high grade block, paced rhythm. Plan for additional procedure 10/20/20 therefore he will remain inpatient until that time.    Kathyrn Drown NP-C Structural Heart Team  Pager: [phone]

## 2020-10-18 NOTE — Progress Notes (Signed)
Rt tried 3x to obtain abg, pt. On the right side has restricted extremity left side has prior surgery scar.

## 2020-10-18 NOTE — Anesthesia Postprocedure Evaluation (Signed)
Anesthesia Post Note  Patient: Gregory [name]  Procedure(s) Performed: BALLOON AORTIC VALVE VALVULOPLASTY TRANSESOPHAGEAL ECHOCARDIOGRAM (TEE)     Patient location during evaluation: PACU Anesthesia Type: General Level of consciousness: awake and alert and awake Pain management: pain level controlled Vital Signs Assessment: post-procedure vital signs reviewed and stable Respiratory status: spontaneous breathing, nonlabored ventilation, respiratory function stable and patient connected to nasal cannula oxygen Cardiovascular status: blood pressure returned to baseline and stable Postop Assessment: no apparent nausea or vomiting Anesthetic complications: no   No notable events documented.  Last Vitals:  Vitals:   10/18/20 1400 10/18/20 1430  BP: 101/63 90/60  Pulse: 60 60  Resp: 19   Temp:    SpO2: 91% 92%    Last Pain:  Vitals:   10/18/20 1400  TempSrc:   PainSc: 0-No pain                 Catalina Gravel

## 2020-10-19 ENCOUNTER — Ambulatory Visit: Payer: Medicare HMO | Admitting: Hematology and Oncology

## 2020-10-19 ENCOUNTER — Inpatient Hospital Stay (HOSPITAL_COMMUNITY): Payer: Medicare HMO

## 2020-10-19 ENCOUNTER — Institutional Professional Consult (permissible substitution): Payer: Medicare HMO | Admitting: Cardiology

## 2020-10-19 ENCOUNTER — Other Ambulatory Visit: Payer: Medicare HMO

## 2020-10-19 ENCOUNTER — Encounter (HOSPITAL_COMMUNITY): Payer: Self-pay | Admitting: Cardiovascular Disease

## 2020-10-19 DIAGNOSIS — Z9889 Other specified postprocedural states: Secondary | ICD-10-CM | POA: Diagnosis not present

## 2020-10-19 DIAGNOSIS — I351 Nonrheumatic aortic (valve) insufficiency: Secondary | ICD-10-CM

## 2020-10-19 LAB — CBC
HCT: 28.3 % — ABNORMAL LOW (ref 39.0–52.0)
Hemoglobin: 8.6 g/dL — ABNORMAL LOW (ref 13.0–17.0)
MCH: 29.4 pg (ref 26.0–34.0)
MCHC: 30.4 g/dL (ref 30.0–36.0)
MCV: 96.6 fL (ref 80.0–100.0)
Platelets: 142 10*3/uL — ABNORMAL LOW (ref 150–400)
RBC: 2.93 MIL/uL — ABNORMAL LOW (ref 4.22–5.81)
RDW: 19.2 % — ABNORMAL HIGH (ref 11.5–15.5)
WBC: 6.8 10*3/uL (ref 4.0–10.5)
nRBC: 0 % (ref 0.0–0.2)

## 2020-10-19 LAB — BASIC METABOLIC PANEL
Anion gap: 6 (ref 5–15)
BUN: 21 mg/dL (ref 8–23)
CO2: 25 mmol/L (ref 22–32)
Calcium: 8.5 mg/dL — ABNORMAL LOW (ref 8.9–10.3)
Chloride: 106 mmol/L (ref 98–111)
Creatinine, Ser: 1.13 mg/dL (ref 0.61–1.24)
GFR, Estimated: >60 mL/min (ref >60)
Glucose, Bld: 119 mg/dL — ABNORMAL HIGH (ref 70–99)
Potassium: 5.1 mmol/L (ref 3.5–5.1)
Sodium: 137 mmol/L (ref 135–145)

## 2020-10-19 LAB — ECHOCARDIOGRAM COMPLETE
AR max vel: 1.36 cm2
AV Area VTI: 1.42 cm2
AV Area mean vel: 1.24 cm2
AV Mean grad: 13 mmHg
AV Peak grad: 22.9 mmHg
Ao pk vel: 2.39 m/s
Calc EF: 35.7 %
Height: 67 in
P 1/2 time: 564 msec
S' Lateral: 5.3 cm
Single Plane A2C EF: 37.5 %
Single Plane A4C EF: 31.2 %
Weight: 3442.7 oz

## 2020-10-19 MED ORDER — CHLORHEXIDINE GLUCONATE 4 % EX LIQD
60.0000 mL | Freq: Once | CUTANEOUS | Status: AC
Start: 2020-10-19 — End: 2020-10-19
  Administered 2020-10-19: 4 via TOPICAL

## 2020-10-19 MED ORDER — CHLORHEXIDINE GLUCONATE 4 % EX LIQD
60.0000 mL | Freq: Once | CUTANEOUS | Status: AC
  Administered 2020-10-20: 4 via TOPICAL
  Filled 2020-10-19: qty 60

## 2020-10-19 MED ORDER — LATANOPROST 0.005 % OP SOLN
1.0000 [drp] | Freq: Every day | OPHTHALMIC | Status: DC
  Administered 2020-10-19 – 2020-10-20 (×2): 1 [drp] via OPHTHALMIC
  Filled 2020-10-19: qty 2.5

## 2020-10-19 MED ORDER — SODIUM CHLORIDE 0.9 % IV SOLN: INTRAVENOUS | Status: DC

## 2020-10-19 MED ORDER — SODIUM CHLORIDE 0.9 % IV SOLN
80.0000 mg | INTRAVENOUS | Status: AC
  Administered 2020-10-20: 80 mg

## 2020-10-19 MED ORDER — CEFAZOLIN SODIUM-DEXTROSE 2-4 GM/100ML-% IV SOLN: 2.0000 g | INTRAVENOUS | Status: AC

## 2020-10-19 NOTE — Progress Notes (Signed)
Mobility Specialist Progress Note:   10/19/20 1105  Therapy Vitals  Pulse Rate 60  BP 113/64  Mobility  Activity Ambulated in hall  Level of Assistance Independent  Assistive Device Front wheel walker  Distance Ambulated (ft) 960 ft  Mobility Ambulated independently in hallway  Mobility Response Tolerated well  Mobility performed by Mobility specialist  $Mobility charge 1 Mobility   Pre Mobility: HR 60 bpm; BP 113/64 During Mobility: HR 60 bpm Post Mobility: HR 60 bpm  Pt received in bed, agreed to mobility. Ambulated in hall 960' with RW and supervision. Pt asx during ambulation, pleased with how he felt during. Groin incisions dry and clean. Left sitting EOB with all needs met.   Nelta Numbers Mobility Specialist  Phone [phone]

## 2020-10-19 NOTE — Progress Notes (Addendum)
[facility] VALVE TEAM  Patient Name: Gregory [name] Date of Encounter: 10/19/2020  Primary Cardiologist: Dr. Percival Spanish / Dr. Burt Knack & Dr. Cyndia Bent (TAVR)  Hospital Problem List     Principal Problem:   S/p aortic valvuloplasty Active Problems:   Essential hypertension   Aortic insufficiency   Class 1 drug-induced obesity with serious comorbidity and body mass index (BMI) of 34.0 to 34.9 in adult   Anemia   S/P aortic valve replacement   HIT (heparin-induced thrombocytopenia)   Atrial fibrillation (HCC)   S/P CABG x 3   Coronary artery disease involving native coronary artery of native heart without angina pectoris   Dyslipidemia   Heart block AV complete (HCC)   Paravalvular leak (prosthetic valve)   Complete heart block (HCC)     Subjective   Doing fine. Getting echo. Was up walking around last night with improvement in dyspnea.   Inpatient Medications    Scheduled Meds:  allopurinol  100 mg Oral Daily   aspirin EC  81 mg Oral Daily   atorvastatin  80 mg Oral Daily   furosemide  40 mg Oral Daily   latanoprost  1 drop Both Eyes Daily   potassium chloride SA  20 mEq Oral Daily   sacubitril-valsartan  1 tablet Oral BID   sertraline  50 mg Oral Daily   sodium chloride flush  3 mL Intravenous Q12H   spironolactone  25 mg Oral Daily   Continuous Infusions:  sodium chloride     sodium chloride     nitroGLYCERIN     PRN Meds: sodium chloride, acetaminophen **OR** acetaminophen, acetaminophen, ALPRAZolam, morphine injection, ondansetron (ZOFRAN) IV, ondansetron (ZOFRAN) IV, oxyCODONE, sodium chloride flush, traMADol, traZODone   Vital Signs    Vitals:   10/19/20 0331 10/19/20 0402 10/19/20 0403 10/19/20 0745  BP: (!) 86/52 (!) 99/54 (!) 99/54 97/84  Pulse: 63 60 60 60  Resp: 16 13 14 14   Temp: 97.7 F (36.5 C)  97.7 F (36.5 C) 98.2 F (36.8 C)  TempSrc: Oral   Oral  SpO2: 99% 98%  96%  Weight: 97.6 kg     Height:         Intake/Output Summary (Last 24 hours) at 10/19/2020 1018 Last data filed at 10/19/2020 1017 Gross per 24 hour  Intake 1782.74 ml  Output 700 ml  Net 1082.74 ml   Filed Weights   10/18/20 0500 10/18/20 1257 10/19/20 0331  Weight: 93.6 kg 97.3 kg 97.6 kg    Physical Exam    GEN: Well nourished, well developed, in no acute distress.  HEENT: Grossly normal.  Neck: Supple, no JVD Cardiac: RRR, soft systolic murmur. No rubs, or gallops. No clubbing, cyanosis, edema.   Respiratory:  Respirations regular and unlabored, clear to auscultation bilaterally. GI: Soft, nontender, nondistended, BS + x 4. MS: no deformity or atrophy. Skin: warm and dry, no rash.  Groin sites clear without hematoma or ecchymosis  Neuro:  Strength and sensation are intact. Psych: AAOx3.  Normal affect.  Labs    CBC Recent Labs    10/18/20 0332 10/18/20 0808 10/18/20 1106 10/19/20 0210  WBC 4.6  --   --  6.8  HGB 9.1*   < > 9.9* 8.6*  HCT 29.9*   < > 29.0* 28.3*  MCV 96.1  --   --  96.6  PLT 134*  --   --  142*   < > = values in this interval not  displayed.   Basic Metabolic Panel Recent Labs    10/18/20 0332 10/18/20 0808 10/18/20 1106 10/19/20 0210  NA 138   < > 138 137  K 4.4   < > 4.7 5.1  CL 108   < > 106 106  CO2 21*  --   --  25  GLUCOSE 89   < > 131* 119*  BUN 21   < > 19 21  CREATININE 0.86   < > 0.70 1.13  CALCIUM 8.8*  --   --  8.5*   < > = values in this interval not displayed.   Liver Function Tests No results for input(s): AST, ALT, ALKPHOS, BILITOT, PROT, ALBUMIN in the last 72 hours. No results for input(s): LIPASE, AMYLASE in the last 72 hours. Cardiac Enzymes No results for input(s): CKTOTAL, CKMB, CKMBINDEX, TROPONINI in the last 72 hours. BNP Invalid input(s): POCBNP D-Dimer No results for input(s): DDIMER in the last 72 hours. Hemoglobin A1C Recent Labs    10/18/20 0332  HGBA1C 4.5*   Fasting Lipid Panel No results for input(s): CHOL, HDL, LDLCALC,  TRIG, CHOLHDL, LDLDIRECT in the last 72 hours. Thyroid Function Tests No results for input(s): TSH, T4TOTAL, T3FREE, THYROIDAB in the last 72 hours.  Invalid input(s): FREET3  Telemetry    Paced with 5 beat run of NSVT - Personally Reviewed  ECG    Sinus with 1st deg AV block and V paced.  - Personally Reviewed  Radiology    DG Chest 2 View  Result Date: 10/17/2020 CLINICAL DATA:  Preoperative evaluation for aortic valve surgery EXAM: CHEST - 2 VIEW COMPARISON:  09/29/2020 FINDINGS: Cardiac shadow is enlarged but stable. Aortic calcifications are noted. Pacing device is now seen. Lungs are clear bilaterally. Aortic valve replacement is noted. No bony abnormality is seen. IMPRESSION: Stable cardiomegaly.  No acute abnormality noted. Electronically Signed   By: Inez Catalina M.D.   On: 10/17/2020 21:53   CARDIAC CATHETERIZATION  Result Date: 10/17/2020 Conclusion: successful insertion of a medtronic active fixation temp-perm PM in a patient with high grade heart block prior to aortic valvuloplasty. [name]   DG Chest Port 1 View  Result Date: 10/18/2020 CLINICAL DATA:  Temporary placed maker placement EXAM: PORTABLE CHEST 1 VIEW COMPARISON:  Chest x-ray 10/17/2020. FINDINGS: Temporary pacemaker is present with leads ending over the right ventricle. The heart is enlarged, unchanged. The lungs are clear. There is no pneumothorax or pleural effusion. No acute fractures are seen. There are healed left-sided rib fractures. IMPRESSION: 1. Stable cardiomegaly.  No evidence for pneumonia or edema. Electronically Signed   By: Ronney Asters M.D.   On: 10/18/2020 16:41   ECHO TEE  Result Date: 10/18/2020    TRANSESOPHOGEAL ECHO REPORT   Patient Name:   Gregory [name] Date of Exam: 10/18/2020 Medical Rec #:  [id number]         Height:       67.0 in Accession #:    [id number]        Weight:       206.3 lb Date of Birth:  30-Aug-1950         BSA:          2.049 m Patient Age:    70 years           BP:           122/62 mmHg Patient Gender: M  HR:           60 bpm. Exam Location:  Inpatient Procedure: Transesophageal Echo, 3D Echo, Cardiac Doppler and Color Doppler Indications:     I35.1 Nonrheumatic aortic (valve) insufficiency  History:         Patient has prior history of Echocardiogram examinations, most                  recent 10/10/2020. Cardiomyopathy, Previous Myocardial                  Infarction, Prior CABG and Abnormal ECG, Aortic Valve Disease;                  Risk Factors:Hypertension and Sleep Apnea. Aortic stenosis.  Sonographer:     Roseanna Rainbow RDCS Referring Phys:  [id number] Woodfin Ganja THOMPSON Diagnosing Phys: Eleonore Chiquito MD PROCEDURE: After discussion of the risks and benefits of a TEE, an informed consent was obtained from the patient. The transesophogeal probe was passed without difficulty through the esophogus of the patient. Sedation performed by different physician. The patient was monitored while under deep sedation. Image quality was excellent. The patient developed no complications during the procedure. IMPRESSIONS  1. A 25 mm [name] Intuity valve (stented surgical prosthesis) is present in the aortic position. There were 2 paravalvular leaks around the stented frame with severe regurgitation in total. The valve leaflets were freely movable and normal. The valve was ballooned under direct echo visualization. After ballooning of the valve, the 2 paravalvular leaks remained but were lessend to trivial. The leaks were under the RCC and under the NCC/LCC. There was also mild central regurgitation after ballooning of  the valve. The MG was reduced from 20 mmHG to 10 mmHG. Vmax 2.2 m/s, MG 10 mmHG, EOA 2.72 cm2, DI 0.60. With the trivial paravalvular leaks and mild central regurgitation, a total of mild to moderate (likely mild) aortic regurgitation remained. The aortic valve has been repaired/replaced. Aortic valve regurgitation is mild to moderate. Procedure Date:  03/06/2019.  2. Left ventricular ejection fraction, by estimation, is 30 to 35%. The left ventricle has moderately decreased function. The left ventricle demonstrates global hypokinesis. The left ventricular internal cavity size was moderately to severely dilated.  3. Right ventricular systolic function is mildly reduced. The right ventricular size is moderately enlarged.  4. Left atrial size was mildly dilated.  5. The mitral valve is grossly normal. Mild mitral valve regurgitation. No evidence of mitral stenosis.  6. Aortic dilatation noted. Aneurysm of the aortic root, measuring 48 mm. There is Moderate (Grade III) layered plaque involving the descending aorta and transverse aorta. FINDINGS  Left Ventricle: Left ventricular ejection fraction, by estimation, is 30 to 35%. The left ventricle has moderately decreased function. The left ventricle demonstrates global hypokinesis. The left ventricular internal cavity size was moderately to severely dilated. Right Ventricle: The right ventricular size is moderately enlarged. No increase in right ventricular wall thickness. Right ventricular systolic function is mildly reduced. Left Atrium: Left atrial size was mildly dilated. Right Atrium: Right atrial size was normal in size. Pericardium: There is no evidence of pericardial effusion. Mitral Valve: The mitral valve is grossly normal. Mild mitral valve regurgitation. No evidence of mitral valve stenosis. Tricuspid Valve: The tricuspid valve is grossly normal. Tricuspid valve regurgitation is trivial. Aortic Valve: A 25 mm [name] Intuity valve (stented surgical prosthesis) is present in the aortic position. There were 2 paravalvular leaks around the stented frame with severe regurgitation in total.  The valve leaflets were freely movable and normal. The valve was ballooned under direct echo visualization. After ballooning of the valve, the 2 paravalvular leaks remained but were lessend to trivial. The leaks were under the  RCC and under the NCC/LCC. There was also mild central regurgitation after ballooning of the valve. The MG was reduced from 20 mmHG to 10 mmHG. Vmax 2.2 m/s, MG 10 mmHG, EOA 2.72 cm2, DI 0.60. With the trivial paravalvular leaks and mild central regurgitation, a total of mild to moderate (likely mild) aortic regurgitation remained. The aortic valve has been repaired/replaced. Aortic valve regurgitation is mild to moderate. Aortic regurgitation PHT measures 1698 msec. Aortic valve mean gradient measures 10.0 mmHg. Aortic valve peak gradient measures 20.2 mmHg. Aortic valve  area, by VTI measures 2.72 cm. There is a 25 mm bioprosthetic valve present in the aortic position. Pulmonic Valve: The pulmonic valve was grossly normal. Pulmonic valve regurgitation is not visualized. No evidence of pulmonic stenosis. Aorta: Aortic dilatation noted. There is an aneurysm involving the aortic root measuring 48 mm. There is moderate (Grade III) layered plaque involving the descending aorta and transverse aorta. IAS/Shunts: The atrial septum is grossly normal.  LEFT VENTRICLE PLAX 2D LVOT diam:     2.40 cm LV SV:         126 LV SV Index:   61 LVOT Area:     4.52 cm  AORTIC VALVE AV Area (Vmax):    2.81 cm AV Area (Vmean):   2.54 cm AV Area (VTI):     2.72 cm AV Vmax:           225.00 cm/s AV Vmean:          162.750 cm/s AV VTI:            0.463 m AV Peak Grad:      20.2 mmHg AV Mean Grad:      10.0 mmHg LVOT Vmax:         140.00 cm/s LVOT Vmean:        91.300 cm/s LVOT VTI:          0.278 m LVOT/AV VTI ratio: 0.60 AI PHT:            1698 msec  SHUNTS Systemic VTI:  0.28 m Systemic Diam: 2.40 cm Eleonore Chiquito MD Electronically signed by Eleonore Chiquito MD Signature Date/Time: 10/18/2020/3:17:12 PM    Final     Cardiac Studies   10/17/20  TEMPORARY PACEMAKER   Conclusion  Conclusion: successful insertion of a medtronic active fixation temp-perm PM in a patient with high grade heart block prior to aortic valvuloplasty.    [name]  _______________________    TAVR OPERATIVE NOTE     Date of Procedure:                10/18/2020   Preoperative Diagnosis:      Severe Aortic Stenosis    Postoperative Diagnosis:    Same    Procedure:        Transcatheter Balloon Aortic Valvulplasty - Percutaneous  Transfemoral Approach             Bard True balloon 26 mm              Co-Surgeons:                        Gaye Pollack, MD, Lenna Sciara, MD, and Sherren Mocha, MD   Anesthesiologist:  Donnelly Angelica, MD   Echocardiographer:              Marry Guan, MD   Pre-operative Echo Findings: Severe paravalvular aortic insufficiency Moderate global LV systolic dysfunction   Post-operative Echo Findings: Mild residual paravalvular and central aortic insufficiency Unchanged left ventricular systolic function  ______________________   Echo 10/19/20: completed but pending formal read at the time of discharge    Patient Profile     LABRANDON KNOCH is a 70 y.o. male with a history of CAD and aortic valve disease s/p CABG/AVR 03/06/2019 with a 25 mm [name] Intuity rapid deployment valve and CABG x 3 with a left radial to PDA, LIMA-LAD, and pedicled RIMA-RPL by Dr. Julien Girt, TAA, post op HIT, HTN, obesity, chronic combined S/D CHF with recent admission for acute on chronic combined S/D CHF, HAVB, worsening anemia and found to have severe bioprosthetic valve dysfunction with severe perivalvular leaking and hemolytic anemia who presented to [facility] on 10/17/20 for planned temp perm pacemaker placement followed by BAV and then permanent pacemaker placement.   Assessment & Plan    Severe bioprosthetic valve dysfunction with severe perivalvular leaking: s/p successful BAV with a 26 mm True Balloon with a reduction from severe PVL to mild residual PVL with mild central AI. POD1 echo pending but reviewed and PVL looks improved. No diastolic murmur. Continue on Asprin alone.   HAVB: he underwent  successful insertion of a medtronic active fixation temp-perm PM on 10/17 by Dr. Lovena Le. Plan for BiV Pacer by Dr. Quentin Ore  Anemia: felt to mostly likely be related to hemolysis with Haptoglobin <10 and LDH 939. Hg is 8.6 today. Will continue to follow.   History of HIT: no heparin products used during admission.   Chronic combined S/D CHF: appears euvolemic. Continue Entresto 49-51 mg BID, spiro 25mg  daily, and lasix 40mg  AM/31m PM. BP is soft but stable.   CAD s/p CABG: recent cath with 3/3 patent bypass grafts. Continue medical therapy.   HTN: BP well controlled in the context of CHF medications. Have been on soft side. Continue to monitor.  SignedAngelena Form, PA-C  10/19/2020, 10:18 AM  Pager [phone]  Patient seen, examined. Available data reviewed. Agree with findings, assessment, and plan as outlined by Nell Range, PA-C.  The patient is clinically stable following balloon aortic valvuloplasty in order to expand his surgical prosthesis and reduced paravalvular regurgitation.  I have personally reviewed his echo images which show marked improvement in paravalvular regurgitation.  On my exam today, the patient is alert, oriented, in no distress.  Lung fields are clear, heart is regular rate and rhythm with a 1/6 systolic ejection murmur at the right upper sternal border, no diastolic murmur, abdomen is soft and nontender, bilateral groin sites are clear, there is no pretibial edema.  Review of telemetry shows ventricular pacing.  Patient with plans to undergo biventricular pacemaker implantation tomorrow by Dr. Quentin Ore.  Progressing very well.  Sherren Mocha, M.D. 10/19/2020 5:00 PM

## 2020-10-19 NOTE — Progress Notes (Signed)
  Echocardiogram 2D Echocardiogram has been performed.  Fidel Levy 10/19/2020, 9:31 AM

## 2020-10-19 NOTE — Progress Notes (Signed)
   Checked on patient and explained risks, benefits, and alternatives to PPM implantation, including but not limited to bleeding, infection, pneumothorax, pericardial effusion, lead dislodgement, heart attack, stroke, or death.  Pt verbalized understanding and agrees to proceed.   Pt remains VP at 45.  Plan for CRT-P with Dr. Quentin Ore tomorrow, 10/20/20. Orders are signed and held.   Legrand Como [address]" [place], PA-C  10/19/2020 11:27 AM

## 2020-10-20 ENCOUNTER — Ambulatory Visit (HOSPITAL_COMMUNITY): Admission: RE | Admit: 2020-10-20 | Payer: Medicare HMO | Source: Ambulatory Visit | Admitting: Cardiology

## 2020-10-20 ENCOUNTER — Encounter (HOSPITAL_COMMUNITY)
Admission: AD | Disposition: A | Discharge status: Home / Self Care | Payer: Medicare HMO | Source: Ambulatory Visit | Attending: Internal Medicine

## 2020-10-20 DIAGNOSIS — I442 Atrioventricular block, complete: Secondary | ICD-10-CM | POA: Diagnosis not present

## 2020-10-20 DIAGNOSIS — I255 Ischemic cardiomyopathy: Secondary | ICD-10-CM | POA: Diagnosis not present

## 2020-10-20 HISTORY — PX: BIV PACEMAKER INSERTION CRT-P: EP1199

## 2020-10-20 LAB — CBC
HCT: 27.9 % — ABNORMAL LOW (ref 39.0–52.0)
Hemoglobin: 8.3 g/dL — ABNORMAL LOW (ref 13.0–17.0)
MCH: 29.3 pg (ref 26.0–34.0)
MCHC: 29.7 g/dL — ABNORMAL LOW (ref 30.0–36.0)
MCV: 98.6 fL (ref 80.0–100.0)
Platelets: 144 10*3/uL — ABNORMAL LOW (ref 150–400)
RBC: 2.83 MIL/uL — ABNORMAL LOW (ref 4.22–5.81)
RDW: 19.6 % — ABNORMAL HIGH (ref 11.5–15.5)
WBC: 6.3 10*3/uL (ref 4.0–10.5)
nRBC: 0 % (ref 0.0–0.2)

## 2020-10-20 LAB — MAGNESIUM: Magnesium: 1.7 mg/dL (ref 1.7–2.4)

## 2020-10-20 LAB — BASIC METABOLIC PANEL
Anion gap: 9 (ref 5–15)
BUN: 16 mg/dL (ref 8–23)
CO2: 24 mmol/L (ref 22–32)
Calcium: 8.7 mg/dL — ABNORMAL LOW (ref 8.9–10.3)
Chloride: 106 mmol/L (ref 98–111)
Creatinine, Ser: 0.84 mg/dL (ref 0.61–1.24)
GFR, Estimated: >60 mL/min (ref >60)
Glucose, Bld: 110 mg/dL — ABNORMAL HIGH (ref 70–99)
Potassium: 4.5 mmol/L (ref 3.5–5.1)
Sodium: 139 mmol/L (ref 135–145)

## 2020-10-20 SURGERY — BIV PACEMAKER INSERTION CRT-P

## 2020-10-20 MED ORDER — CEFAZOLIN SODIUM-DEXTROSE 2-4 GM/100ML-% IV SOLN
INTRAVENOUS | Status: AC
  Filled 2020-10-20: qty 100

## 2020-10-20 MED ORDER — IOHEXOL 350 MG/ML SOLN
INTRAVENOUS | Status: DC | PRN
  Administered 2020-10-20: 5 mL

## 2020-10-20 MED ORDER — CEFAZOLIN SODIUM-DEXTROSE 2-3 GM-%(50ML) IV SOLR
INTRAVENOUS | Status: DC | PRN
  Administered 2020-10-20: 2 g via INTRAVENOUS

## 2020-10-20 MED ORDER — MAGNESIUM SULFATE 2 GM/50ML IV SOLN
2.0000 g | Freq: Once | INTRAVENOUS | Status: AC
  Administered 2020-10-20: 2 g via INTRAVENOUS
  Filled 2020-10-20: qty 50

## 2020-10-20 MED ORDER — LIDOCAINE HCL (PF) 1 % IJ SOLN
INTRAMUSCULAR | Status: DC | PRN
  Administered 2020-10-20: 30 mL

## 2020-10-20 MED ORDER — SODIUM CHLORIDE 0.9 % IV SOLN
INTRAVENOUS | Status: AC
  Filled 2020-10-20: qty 2

## 2020-10-20 MED ORDER — FENTANYL CITRATE (PF) 100 MCG/2ML IJ SOLN
INTRAMUSCULAR | Status: DC | PRN
  Administered 2020-10-20: 25 ug via INTRAVENOUS
  Administered 2020-10-20: 12.5 ug via INTRAVENOUS

## 2020-10-20 MED ORDER — MIDAZOLAM HCL 5 MG/5ML IJ SOLN
INTRAMUSCULAR | Status: DC | PRN
  Administered 2020-10-20: .5 mg via INTRAVENOUS
  Administered 2020-10-20: 1 mg via INTRAVENOUS

## 2020-10-20 MED FILL — Potassium Chloride Inj 2 mEq/ML: INTRAVENOUS | Qty: 40 | Status: AC

## 2020-10-20 MED FILL — Magnesium Sulfate Inj 50%: INTRAMUSCULAR | Qty: 10 | Status: AC

## 2020-10-20 SURGICAL SUPPLY — 20 items
BALLN COR SINUS VENO 6FR 80 (BALLOONS) ×2
BALLOON COR SINUS VENO 6FR 80 (BALLOONS) IMPLANT
CABLE SURGICAL S-101-97-12 (CABLE) ×2 IMPLANT
CATH ATTAIN COM SURV 6250V-EH (CATHETERS) ×1 IMPLANT
CATH JOSEPH QUAD ALLRED 6F REP (CATHETERS) ×1 IMPLANT
ICD GALLANT HFCRTD CDHFA500Q (ICD Generator) ×1 IMPLANT
LEAD DURATA 7122Q-65CM (Lead) ×1 IMPLANT
LEAD QUARTET 1458Q-86CM (Lead) ×1 IMPLANT
LEAD TENDRIL MRI 52CM LPA1200M (Lead) ×1 IMPLANT
PAD PRO RADIOLUCENT 2001M-C (PAD) ×2 IMPLANT
POUCH AIGIS-R ANTIBACT ICD (Mesh General) ×2 IMPLANT
POUCH AIGIS-R ANTIBACT ICD LRG (Mesh General) IMPLANT
SHEATH 7FR PRELUDE SNAP 13 (SHEATH) ×1 IMPLANT
SHEATH 8FR PRELUDE SNAP 13 (SHEATH) ×1 IMPLANT
SHEATH 9.5FR PRELUDE SNAP 13 (SHEATH) ×1 IMPLANT
SHEATH PROBE COVER 6X72 (BAG) ×1 IMPLANT
SLITTER 6232ADJ (MISCELLANEOUS) ×1 IMPLANT
TRAY PACEMAKER INSERTION (PACKS) ×2 IMPLANT
WIRE ACUITY WHISPER EDS 4648 (WIRE) ×1 IMPLANT
WIRE HI TORQ VERSACORE-J 145CM (WIRE) ×1 IMPLANT

## 2020-10-20 NOTE — Progress Notes (Signed)
CARDIAC REHAB PHASE I   PRE:  Rate/Rhythm: 60 paced  MODE:  Ambulation: 740 ft   POST:  Rate/Rhythm: 90 paced with PVCs  BP:  Sitting: 123/78    SaO2: 97 RA   Pt eager to ambulate. Pt ambulated 728ft in hallway independently with front wheel walker with fast, steady gait. Pt denies PC, SOB, or dizziness. Does endorse some groin soreness upon rising and sitting. For procedure today. Will continue to follow.  [id number] Rufina Falco, RN BSN 10/20/2020 11:15 AM

## 2020-10-20 NOTE — Progress Notes (Addendum)
Electrophysiology Rounding Note  Patient Name: Gregory [name] Date of Encounter: 10/20/2020  Primary Cardiologist: [name], MD Electrophysiologist: Dr. Quentin Ore   Subjective   The patient is doing well today.  At this time, the patient denies chest pain, shortness of breath, or any new concerns.  Inpatient Medications    Scheduled Meds:  allopurinol  100 mg Oral Daily   aspirin EC  81 mg Oral Daily   atorvastatin  80 mg Oral Daily   furosemide  40 mg Oral Daily   gentamicin irrigation  80 mg Irrigation On Call   latanoprost  1 drop Both Eyes Daily   sacubitril-valsartan  1 tablet Oral BID   sertraline  50 mg Oral Daily   sodium chloride flush  3 mL Intravenous Q12H   spironolactone  25 mg Oral Daily   Continuous Infusions:  sodium chloride     sodium chloride     sodium chloride     sodium chloride 50 mL/hr at 10/20/20 9379    ceFAZolin (ANCEF) IV     magnesium sulfate bolus IVPB     nitroGLYCERIN     PRN Meds: sodium chloride, acetaminophen **OR** acetaminophen, acetaminophen, ALPRAZolam, morphine injection, ondansetron (ZOFRAN) IV, ondansetron (ZOFRAN) IV, oxyCODONE, sodium chloride flush, traMADol, traZODone   Vital Signs    Vitals:   10/19/20 1931 10/20/20 0100 10/20/20 0347 10/20/20 0818  BP: (!) 94/52 (!) 95/47 114/64 (!) 111/58  Pulse: 60 65 60 60  Resp: 19 18 17 13   Temp: 98 F (36.7 C) 98.3 F (36.8 C) 98.3 F (36.8 C) 98.4 F (36.9 C)  TempSrc: Oral Oral Oral Oral  SpO2: 100% 100% 100% 100%  Weight:      Height:        Intake/Output Summary (Last 24 hours) at 10/20/2020 0838 Last data filed at 10/20/2020 0350 Gross per 24 hour  Intake --  Output 1700 ml  Net -1700 ml   Filed Weights   10/18/20 0500 10/18/20 1257 10/19/20 0331  Weight: 93.6 kg 97.3 kg 97.6 kg    Physical Exam    GEN- The patient is well appearing, alert and oriented x 3 today.   Head- normocephalic, atraumatic Eyes-  Sclera clear, conjunctiva pink Ears-  hearing intact Oropharynx- clear Neck- supple Lungs- Clear to ausculation bilaterally, normal work of breathing Heart- Regular rate and rhythm, no murmurs, rubs or gallops GI- soft, NT, ND, + BS Extremities- no clubbing or cyanosis. No edema Skin- no rash or lesion Psych- euthymic mood, full affect Neuro- strength and sensation are intact  Labs    CBC Recent Labs    10/19/20 0210 10/20/20 0227  WBC 6.8 6.3  HGB 8.6* 8.3*  HCT 28.3* 27.9*  MCV 96.6 98.6  PLT 142* 024*   Basic Metabolic Panel Recent Labs    10/19/20 0210 10/20/20 0227  NA 137 139  K 5.1 4.5  CL 106 106  CO2 25 24  GLUCOSE 119* 110*  BUN 21 16  CREATININE 1.13 0.84  CALCIUM 8.5* 8.7*  MG  --  1.7   Liver Function Tests No results for input(s): AST, ALT, ALKPHOS, BILITOT, PROT, ALBUMIN in the last 72 hours. No results for input(s): LIPASE, AMYLASE in the last 72 hours. Cardiac Enzymes No results for input(s): CKTOTAL, CKMB, CKMBINDEX, TROPONINI in the last 72 hours.   Telemetry    V pacing 60s (personally reviewed)  Radiology    DG Chest Port 1 View  Result Date: 10/18/2020 CLINICAL DATA:  Temporary placed maker placement EXAM: PORTABLE CHEST 1 VIEW COMPARISON:  Chest x-ray 10/17/2020. FINDINGS: Temporary pacemaker is present with leads ending over the right ventricle. The heart is enlarged, unchanged. The lungs are clear. There is no pneumothorax or pleural effusion. No acute fractures are seen. There are healed left-sided rib fractures. IMPRESSION: 1. Stable cardiomegaly.  No evidence for pneumonia or edema. Electronically Signed   By: Ronney Asters M.D.   On: 10/18/2020 16:41   ECHOCARDIOGRAM COMPLETE  Result Date: 10/19/2020    ECHOCARDIOGRAM REPORT   Patient Name:   Gregory [name] Date of Exam: 10/19/2020 Medical Rec #:  [id number]         Height:       67.0 in Accession #:    [id number]        Weight:       215.2 lb Date of Birth:  Nov 25, 1950         BSA:          2.086 m Patient Age:     70 years          BP:           99/54 mmHg Patient Gender: M                 HR:           59 bpm. Exam Location:  Inpatient Procedure: 2D Echo, Cardiac Doppler and Color Doppler Indications:    Aortic regurgitation I35.1  History:        Patient has prior history of Echocardiogram examinations, most                 recent 10/18/2020. CAD, Aortic Valve Disease; Risk Factors:ETOH,                 Hypertension and Sleep Apnea.                 Aortic Valve: 25 mm [name] valve is present in the aortic                 position. Procedure Date: 03/06/19.  Sonographer:    Bernadene Person RDCS Referring Phys: [place]  1. Left ventricular ejection fraction, by estimation, is 30 to 35%. The left ventricle has moderately decreased function. The left ventricle demonstrates global hypokinesis. The left ventricular internal cavity size was moderately dilated. There is moderate eccentric left ventricular hypertrophy. Left ventricular diastolic parameters are consistent with Grade I diastolic dysfunction (impaired relaxation).  2. Right ventricular systolic function is mildly reduced. The right ventricular size is normal. There is normal pulmonary artery systolic pressure.  3. Left atrial size was severely dilated.  4. Right atrial size was mildly dilated.  5. The mitral valve is normal in structure. Trivial mitral valve regurgitation.  6. There is residual perivalvular aortic insufficiency, improved from the previous study. There is mild perivalvular insufficiency in the vicinity of the left coronary ostium. There is trivial leak towards the mitral annulus. The aortic valve has been repaired/replaced. Aortic valve regurgitation is mild. There is a 25 mm [name] valve present in the aortic position. Procedure Date: 03/06/19. Aortic regurgitation PHT measures 564 msec. Aortic valve mean gradient measures 13.0 mmHg. Aortic valve Vmax measures 2.39 m/s. Aortic valve acceleration time measures 85 msec.  7. Aortic  dilatation noted. There is moderate dilatation of the aortic root, measuring 47 mm. There is moderate dilatation of the ascending aorta, measuring 46 mm.  8. The inferior  vena cava is dilated in size with >50% respiratory variability, suggesting right atrial pressure of 8 mmHg. Comparison(s): Prior images reviewed side by side. There is marked improvement in the severity of the aortic insufficiency and marked reduction in the aortic prosthesis gradients. FINDINGS  Left Ventricle: Left ventricular ejection fraction, by estimation, is 30 to 35%. The left ventricle has moderately decreased function. The left ventricle demonstrates global hypokinesis. The left ventricular internal cavity size was moderately dilated. There is moderate eccentric left ventricular hypertrophy. Abnormal (paradoxical) septal motion, consistent with RV pacemaker. Left ventricular diastolic parameters are consistent with Grade I diastolic dysfunction (impaired relaxation). Right Ventricle: The right ventricular size is normal. No increase in right ventricular wall thickness. Right ventricular systolic function is mildly reduced. There is normal pulmonary artery systolic pressure. The tricuspid regurgitant velocity is 2.32 m/s, and with an assumed right atrial pressure of 8 mmHg, the estimated right ventricular systolic pressure is 40.9 mmHg. Left Atrium: Left atrial size was severely dilated. Right Atrium: Right atrial size was mildly dilated. Pericardium: Trivial pericardial effusion is present. Mitral Valve: The mitral valve is normal in structure. Trivial mitral valve regurgitation. Tricuspid Valve: The tricuspid valve is normal in structure. Tricuspid valve regurgitation is mild. Aortic Valve: There is residual perivalvular aortic insufficiency, improved from the previous study. There is mild perivalvular insufficiency in the vicinity of the left coronary ostium. There is trivial leak towards the mitral annulus. The aortic valve has been  repaired/replaced. Aortic valve regurgitation is mild. Aortic regurgitation PHT measures 564 msec. Aortic valve mean gradient measures 13.0 mmHg. Aortic valve peak gradient measures 22.9 mmHg. Aortic valve area, by VTI measures 1.42 cm. There is a 25 mm [name] valve present in the aortic position. Procedure Date: 03/06/19. Pulmonic Valve: The pulmonic valve was grossly normal. Pulmonic valve regurgitation is not visualized. Aorta: Aortic dilatation noted. There is moderate dilatation of the aortic root, measuring 47 mm. There is moderate dilatation of the ascending aorta, measuring 46 mm. Venous: The inferior vena cava is dilated in size with greater than 50% respiratory variability, suggesting right atrial pressure of 8 mmHg. IAS/Shunts: No atrial level shunt detected by color flow Doppler. Additional Comments: A device lead is visualized.  LEFT VENTRICLE PLAX 2D LVIDd:         6.40 cm LVIDs:         5.30 cm LV PW:         1.30 cm LV IVS:        1.50 cm LVOT diam:     2.10 cm LV SV:         64 LV SV Index:   31 LVOT Area:     3.46 cm  LV Volumes (MOD) LV vol d, MOD A2C: 264.0 ml LV vol d, MOD A4C: 231.0 ml LV vol s, MOD A2C: 165.0 ml LV vol s, MOD A4C: 159.0 ml LV SV MOD A2C:     99.0 ml LV SV MOD A4C:     231.0 ml LV SV MOD BP:      91.0 ml RIGHT VENTRICLE TAPSE (M-mode): 1.3 cm LEFT ATRIUM              Index        RIGHT ATRIUM           Index LA diam:        5.30 cm  2.54 cm/m   RA Area:     15.30 cm LA Vol (A2C):   102.0 ml 48.90 ml/m  RA Volume:   36.40 ml  17.45 ml/m LA Vol (A4C):   115.0 ml 55.13 ml/m LA Biplane Vol: 110.0 ml 52.73 ml/m  AORTIC VALVE AV Area (Vmax):    1.36 cm AV Area (Vmean):   1.24 cm AV Area (VTI):     1.42 cm AV Vmax:           239.25 cm/s AV Vmean:          162.500 cm/s AV VTI:            0.452 m AV Peak Grad:      22.9 mmHg AV Mean Grad:      13.0 mmHg LVOT Vmax:         94.00 cm/s LVOT Vmean:        58.067 cm/s LVOT VTI:          0.185 m LVOT/AV VTI ratio: 0.41 AI PHT:             564 msec  AORTA Ao Root diam: 4.70 cm Ao Asc diam:  4.60 cm TRICUSPID VALVE TR Peak grad:   21.5 mmHg TR Vmax:        232.00 cm/s  SHUNTS Systemic VTI:  0.19 m Systemic Diam: 2.10 cm Dani Gobble Croitoru MD Electronically signed by Sanda Klein MD Signature Date/Time: 10/19/2020/5:05:20 PM    Final     Patient Profile     KYRI DAI is a 70 y.o. male with a past medical history significant for advanced AV block and AI with perivalvular leak.  he was admitted for planned procedures Temp perm pacer -> AI valvuloplasty -> perm PPM.   Assessment & Plan    2nd degree AV block /CHB Temp/perm in place Plan for permanent BiV PPM vs ICD implantation this afternoon.  Explained risks, benefits, and alternatives to PPM/ICD implantation, including but not limited to bleeding, infection, pneumothorax, pericardial effusion, lead dislodgement, heart attack, stroke, or death.  Pt verbalized understanding and agrees to proceed   2. AI s/p balloon valvuloplasty 10/18 Stable post op.   3. Chronic systolic CHF Echo 26/20/3559 LVEF 30-35% On GDMT with Delene Loll and Arlyce Harman. Add BB post Device.   For questions or updates, please contact [facility] Please consult [email or web address] for contact info under Cardiology/STEMI.  Signed, Shirley Friar, PA-C  10/20/2020, 8:38 AM

## 2020-10-20 NOTE — Progress Notes (Addendum)
[facility] VALVE TEAM  Patient Name: Gregory [name] Date of Encounter: 10/20/2020  Primary Cardiologist: Dr. Percival Spanish / Dr. Burt Knack & Dr. Cyndia Bent (structural heart)  Hospital Problem List     Principal Problem:   S/p aortic valvuloplasty Active Problems:   Essential hypertension   Aortic insufficiency   Class 1 drug-induced obesity with serious comorbidity and body mass index (BMI) of 34.0 to 34.9 in adult   Anemia   S/P aortic valve replacement   HIT (heparin-induced thrombocytopenia)   Atrial fibrillation (HCC)   S/P CABG x 3   Coronary artery disease involving native coronary artery of native heart without angina pectoris   Dyslipidemia   Heart block AV complete (HCC)   Paravalvular leak (prosthetic valve)   Complete heart block ([place])     Subjective   Laying in bed. Anxious to get out of the hospital  Inpatient Medications    Scheduled Meds:  allopurinol  100 mg Oral Daily   aspirin EC  81 mg Oral Daily   atorvastatin  80 mg Oral Daily   furosemide  40 mg Oral Daily   gentamicin irrigation  80 mg Irrigation On Call   latanoprost  1 drop Both Eyes Daily   sacubitril-valsartan  1 tablet Oral BID   sertraline  50 mg Oral Daily   sodium chloride flush  3 mL Intravenous Q12H   spironolactone  25 mg Oral Daily   Continuous Infusions:  sodium chloride     sodium chloride     sodium chloride     sodium chloride 50 mL/hr at 10/20/20 1829    ceFAZolin (ANCEF) IV     magnesium sulfate bolus IVPB     nitroGLYCERIN     PRN Meds: sodium chloride, acetaminophen **OR** acetaminophen, acetaminophen, ALPRAZolam, morphine injection, ondansetron (ZOFRAN) IV, ondansetron (ZOFRAN) IV, oxyCODONE, sodium chloride flush, traMADol, traZODone   Vital Signs    Vitals:   10/19/20 1931 10/20/20 0100 10/20/20 0347 10/20/20 0818  BP: (!) 94/52 (!) 95/47 114/64 (!) 111/58  Pulse: 60 65 60 60  Resp: 19 18 17 13   Temp: 98 F (36.7 C) 98.3  F (36.8 C) 98.3 F (36.8 C) 98.4 F (36.9 C)  TempSrc: Oral Oral Oral Oral  SpO2: 100% 100% 100% 100%  Weight:      Height:        Intake/Output Summary (Last 24 hours) at 10/20/2020 0948 Last data filed at 10/20/2020 0350 Gross per 24 hour  Intake --  Output 1700 ml  Net -1700 ml   Filed Weights   10/18/20 0500 10/18/20 1257 10/19/20 0331  Weight: 93.6 kg 97.3 kg 97.6 kg    Physical Exam    GEN: Well nourished, well developed, in no acute distress.  HEENT: Grossly normal.  Neck: Supple, no JVD Cardiac: RRR, soft systolic murmur. No rubs, or gallops. No clubbing, cyanosis, edema.   Respiratory:  Respirations regular and unlabored, clear to auscultation bilaterally. GI: Soft, nontender, nondistended, BS + x 4. MS: no deformity or atrophy. Skin: warm and dry, no rash.  Groin sites clear without hematoma or ecchymosis  Neuro:  Strength and sensation are intact. Psych: AAOx3.  Normal affect.  Labs    CBC Recent Labs    10/19/20 0210 10/20/20 0227  WBC 6.8 6.3  HGB 8.6* 8.3*  HCT 28.3* 27.9*  MCV 96.6 98.6  PLT 142* 937*   Basic Metabolic Panel Recent Labs    10/19/20 0210 10/20/20 0227  NA 137 139  K 5.1 4.5  CL 106 106  CO2 25 24  GLUCOSE 119* 110*  BUN 21 16  CREATININE 1.13 0.84  CALCIUM 8.5* 8.7*  MG  --  1.7   Liver Function Tests No results for input(s): AST, ALT, ALKPHOS, BILITOT, PROT, ALBUMIN in the last 72 hours. No results for input(s): LIPASE, AMYLASE in the last 72 hours. Cardiac Enzymes No results for input(s): CKTOTAL, CKMB, CKMBINDEX, TROPONINI in the last 72 hours. BNP Invalid input(s): POCBNP D-Dimer No results for input(s): DDIMER in the last 72 hours. Hemoglobin A1C Recent Labs    10/18/20 0332  HGBA1C 4.5*   Fasting Lipid Panel No results for input(s): CHOL, HDL, LDLCALC, TRIG, CHOLHDL, LDLDIRECT in the last 72 hours. Thyroid Function Tests No results for input(s): TSH, T4TOTAL, T3FREE, THYROIDAB in the last 72  hours.  Invalid input(s): FREET3  Telemetry    Paced with 5 beat run of NSVT - Personally Reviewed  ECG    Sinus with 1st deg AV block and V paced.  - Personally Reviewed  Radiology    DG Chest Port 1 View  Result Date: 10/18/2020 CLINICAL DATA:  Temporary placed maker placement EXAM: PORTABLE CHEST 1 VIEW COMPARISON:  Chest x-ray 10/17/2020. FINDINGS: Temporary pacemaker is present with leads ending over the right ventricle. The heart is enlarged, unchanged. The lungs are clear. There is no pneumothorax or pleural effusion. No acute fractures are seen. There are healed left-sided rib fractures. IMPRESSION: 1. Stable cardiomegaly.  No evidence for pneumonia or edema. Electronically Signed   By: Ronney Asters M.D.   On: 10/18/2020 16:41   ECHOCARDIOGRAM COMPLETE  Result Date: 10/19/2020    ECHOCARDIOGRAM REPORT   Patient Name:   Gregory [name] Date of Exam: 10/19/2020 Medical Rec #:  [id number]         Height:       67.0 in Accession #:    [id number]        Weight:       215.2 lb Date of Birth:  1950-04-11         BSA:          2.086 m Patient Age:    70 years          BP:           99/54 mmHg Patient Gender: M                 HR:           59 bpm. Exam Location:  Inpatient Procedure: 2D Echo, Cardiac Doppler and Color Doppler Indications:    Aortic regurgitation I35.1  History:        Patient has prior history of Echocardiogram examinations, most                 recent 10/18/2020. CAD, Aortic Valve Disease; Risk Factors:ETOH,                 Hypertension and Sleep Apnea.                 Aortic Valve: 25 mm [name] valve is present in the aortic                 position. Procedure Date: 03/06/19.  Sonographer:    Bernadene Person RDCS Referring Phys: [place]  1. Left ventricular ejection fraction, by estimation, is 30 to 35%. The left ventricle has moderately decreased function. The left ventricle demonstrates  global hypokinesis. The left ventricular internal cavity size  was moderately dilated. There is moderate eccentric left ventricular hypertrophy. Left ventricular diastolic parameters are consistent with Grade I diastolic dysfunction (impaired relaxation).  2. Right ventricular systolic function is mildly reduced. The right ventricular size is normal. There is normal pulmonary artery systolic pressure.  3. Left atrial size was severely dilated.  4. Right atrial size was mildly dilated.  5. The mitral valve is normal in structure. Trivial mitral valve regurgitation.  6. There is residual perivalvular aortic insufficiency, improved from the previous study. There is mild perivalvular insufficiency in the vicinity of the left coronary ostium. There is trivial leak towards the mitral annulus. The aortic valve has been repaired/replaced. Aortic valve regurgitation is mild. There is a 25 mm [name] valve present in the aortic position. Procedure Date: 03/06/19. Aortic regurgitation PHT measures 564 msec. Aortic valve mean gradient measures 13.0 mmHg. Aortic valve Vmax measures 2.39 m/s. Aortic valve acceleration time measures 85 msec.  7. Aortic dilatation noted. There is moderate dilatation of the aortic root, measuring 47 mm. There is moderate dilatation of the ascending aorta, measuring 46 mm.  8. The inferior vena cava is dilated in size with >50% respiratory variability, suggesting right atrial pressure of 8 mmHg. Comparison(s): Prior images reviewed side by side. There is marked improvement in the severity of the aortic insufficiency and marked reduction in the aortic prosthesis gradients. FINDINGS  Left Ventricle: Left ventricular ejection fraction, by estimation, is 30 to 35%. The left ventricle has moderately decreased function. The left ventricle demonstrates global hypokinesis. The left ventricular internal cavity size was moderately dilated. There is moderate eccentric left ventricular hypertrophy. Abnormal (paradoxical) septal motion, consistent with RV pacemaker. Left  ventricular diastolic parameters are consistent with Grade I diastolic dysfunction (impaired relaxation). Right Ventricle: The right ventricular size is normal. No increase in right ventricular wall thickness. Right ventricular systolic function is mildly reduced. There is normal pulmonary artery systolic pressure. The tricuspid regurgitant velocity is 2.32 m/s, and with an assumed right atrial pressure of 8 mmHg, the estimated right ventricular systolic pressure is 89.3 mmHg. Left Atrium: Left atrial size was severely dilated. Right Atrium: Right atrial size was mildly dilated. Pericardium: Trivial pericardial effusion is present. Mitral Valve: The mitral valve is normal in structure. Trivial mitral valve regurgitation. Tricuspid Valve: The tricuspid valve is normal in structure. Tricuspid valve regurgitation is mild. Aortic Valve: There is residual perivalvular aortic insufficiency, improved from the previous study. There is mild perivalvular insufficiency in the vicinity of the left coronary ostium. There is trivial leak towards the mitral annulus. The aortic valve has been repaired/replaced. Aortic valve regurgitation is mild. Aortic regurgitation PHT measures 564 msec. Aortic valve mean gradient measures 13.0 mmHg. Aortic valve peak gradient measures 22.9 mmHg. Aortic valve area, by VTI measures 1.42 cm. There is a 25 mm [name] valve present in the aortic position. Procedure Date: 03/06/19. Pulmonic Valve: The pulmonic valve was grossly normal. Pulmonic valve regurgitation is not visualized. Aorta: Aortic dilatation noted. There is moderate dilatation of the aortic root, measuring 47 mm. There is moderate dilatation of the ascending aorta, measuring 46 mm. Venous: The inferior vena cava is dilated in size with greater than 50% respiratory variability, suggesting right atrial pressure of 8 mmHg. IAS/Shunts: No atrial level shunt detected by color flow Doppler. Additional Comments: A device lead is visualized.   LEFT VENTRICLE PLAX 2D LVIDd:         6.40 cm LVIDs:  5.30 cm LV PW:         1.30 cm LV IVS:        1.50 cm LVOT diam:     2.10 cm LV SV:         64 LV SV Index:   31 LVOT Area:     3.46 cm  LV Volumes (MOD) LV vol d, MOD A2C: 264.0 ml LV vol d, MOD A4C: 231.0 ml LV vol s, MOD A2C: 165.0 ml LV vol s, MOD A4C: 159.0 ml LV SV MOD A2C:     99.0 ml LV SV MOD A4C:     231.0 ml LV SV MOD BP:      91.0 ml RIGHT VENTRICLE TAPSE (M-mode): 1.3 cm LEFT ATRIUM              Index        RIGHT ATRIUM           Index LA diam:        5.30 cm  2.54 cm/m   RA Area:     15.30 cm LA Vol (A2C):   102.0 ml 48.90 ml/m  RA Volume:   36.40 ml  17.45 ml/m LA Vol (A4C):   115.0 ml 55.13 ml/m LA Biplane Vol: 110.0 ml 52.73 ml/m  AORTIC VALVE AV Area (Vmax):    1.36 cm AV Area (Vmean):   1.24 cm AV Area (VTI):     1.42 cm AV Vmax:           239.25 cm/s AV Vmean:          162.500 cm/s AV VTI:            0.452 m AV Peak Grad:      22.9 mmHg AV Mean Grad:      13.0 mmHg LVOT Vmax:         94.00 cm/s LVOT Vmean:        58.067 cm/s LVOT VTI:          0.185 m LVOT/AV VTI ratio: 0.41 AI PHT:            564 msec  AORTA Ao Root diam: 4.70 cm Ao Asc diam:  4.60 cm TRICUSPID VALVE TR Peak grad:   21.5 mmHg TR Vmax:        232.00 cm/s  SHUNTS Systemic VTI:  0.19 m Systemic Diam: 2.10 cm Dani Gobble Croitoru MD Electronically signed by Sanda Klein MD Signature Date/Time: 10/19/2020/5:05:20 PM    Final     Cardiac Studies   10/17/20  TEMPORARY PACEMAKER   Conclusion  Conclusion: successful insertion of a medtronic active fixation temp-perm PM in a patient with high grade heart block prior to aortic valvuloplasty.   [name]  _______________________    TAVR OPERATIVE NOTE     Date of Procedure:                10/18/2020   Preoperative Diagnosis:      Severe Aortic Stenosis    Postoperative Diagnosis:    Same    Procedure:        Transcatheter Balloon Aortic Valvulplasty - Percutaneous  Transfemoral Approach              Bard True balloon 26 mm              Co-Surgeons:                        Gaye Pollack, MD,  Lenna Sciara, MD, and Sherren Mocha, MD   Anesthesiologist:                  Donnelly Angelica, MD   Echocardiographer:              Marry Guan, MD   Pre-operative Echo Findings: Severe paravalvular aortic insufficiency Moderate global LV systolic dysfunction   Post-operative Echo Findings: Mild residual paravalvular and central aortic insufficiency Unchanged left ventricular systolic function  ______________________   Echo 10/19/20: IMPRESSIONS   1. Left ventricular ejection fraction, by estimation, is 30 to 35%. The  left ventricle has moderately decreased function. The left ventricle  demonstrates global hypokinesis. The left ventricular internal cavity size  was moderately dilated. There is  moderate eccentric left ventricular hypertrophy. Left ventricular  diastolic parameters are consistent with Grade I diastolic dysfunction  (impaired relaxation).   2. Right ventricular systolic function is mildly reduced. The right  ventricular size is normal. There is normal pulmonary artery systolic  pressure.   3. Left atrial size was severely dilated.   4. Right atrial size was mildly dilated.   5. The mitral valve is normal in structure. Trivial mitral valve  regurgitation.   6. There is residual perivalvular aortic insufficiency, improved from the  previous study. There is mild perivalvular insufficiency in the vicinity  of the left coronary ostium. There is trivial leak towards the mitral  annulus. The aortic valve has been  repaired/replaced. Aortic valve regurgitation is mild. There is a 25 mm  [name] valve present in the aortic position. Procedure Date: 03/06/19.  Aortic regurgitation PHT measures 564 msec. Aortic valve mean gradient  measures 13.0 mmHg. Aortic valve Vmax  measures 2.39 m/s. Aortic valve acceleration time measures 85 msec.   7. Aortic dilatation noted.  There is moderate dilatation of the aortic  root, measuring 47 mm. There is moderate dilatation of the ascending  aorta, measuring 46 mm.   8. The inferior vena cava is dilated in size with >50% respiratory  variability, suggesting right atrial pressure of 8 mmHg.   Comparison(s): Prior images reviewed side by side. There is marked  improvement in the severity of the aortic insufficiency and marked  reduction in the aortic prosthesis gradients.    Patient Profile     Gregory [name] is a 70 y.o. male with a history of CAD and aortic valve disease s/p CABG/AVR 03/06/2019 with a 25 mm [name] Intuity rapid deployment valve and CABG x 3 with a left radial to PDA, LIMA-LAD, and pedicled RIMA-RPL by Dr. Julien Girt, TAA, post op HIT, HTN, obesity, chronic combined S/D CHF with recent admission for acute on chronic combined S/D CHF, HAVB, worsening anemia and found to have severe bioprosthetic valve dysfunction with severe perivalvular leaking and hemolytic anemia who presented to [facility] on 10/17/20 for planned temp perm pacemaker placement followed by BAV and then permanent pacemaker placement.   Assessment & Plan    Severe bioprosthetic valve dysfunction with severe perivalvular leaking: s/p successful BAV with a 26 mm True Balloon in order to expand his surgical prosthesis, with a reduction from severe PVL to mild residual PVL with mild central AI. POD1 echo showed EF 30-35%, mild RV dysfunction, residual perivalvular aortic insufficiency, improved from the previous study. No diastolic murmur. Continue on Asprin alone. Hopeful discharge tomorrow Am.  HAVB: he underwent successful insertion of a medtronic active fixation temp-perm PM on 10/17 by Dr. Lovena Le. Plan for BiV Pacer by Dr. Quentin Ore today  Anemia:  felt to mostly likely be related to hemolysis with Haptoglobin <10 and LDH 939. Hg is 8.3 today. Will continue to follow.   History of HIT: no heparin products used during admission.   Chronic combined  S/D CHF: appears euvolemic. Continue Entresto 49-51 mg BID, spiro 25mg  daily, and lasix 40mg  AM/39m PM.   CAD s/p CABG: recent cath with 3/3 patent bypass grafts. Continue medical therapy.   HTN: BP well controlled in the context of CHF medications.   Mable Fill, PA-C  10/20/2020, 9:48 AM  Pager [phone]  Patient seen in EP lab today undergoing BIV pacemaker insertion. Will follow-up in the am with plans for DC tomorrow if clinically stable. Otherwise as above.   Sherren Mocha, M.D. 10/20/2020 9:48 AM

## 2020-10-20 NOTE — Discharge Summary (Addendum)
[place] VALVE TEAM  Discharge Summary    Patient ID: Gregory [name] MRN: [id number]; DOB: 11-Aug-1950  Admit date: 10/17/2020 Discharge date: 10/21/2020  Primary Care Provider: Jinny Sanders, MD  Primary Cardiologist: [name], MD / Dr. Burt Knack & Dr. Cyndia Bent (structural heart)  Discharge Diagnoses    Principal Problem:   S/p aortic valvuloplasty Active Problems:   Essential hypertension   Aortic insufficiency   Class 1 drug-induced obesity with serious comorbidity and body mass index (BMI) of 34.0 to 34.9 in adult   Anemia   S/P aortic valve replacement   HIT (heparin-induced thrombocytopenia)   Atrial fibrillation (HCC)   S/P CABG x 3   Coronary artery disease involving native coronary artery of native heart without angina pectoris   Dyslipidemia   Heart block AV complete (HCC)   Paravalvular leak (prosthetic valve)   Complete heart block (HCC)   Allergies Allergies  Allergen Reactions   Heparin     HIT antibody and SRA positive    Diagnostic Studies/Procedures      10/17/20   TEMPORARY PACEMAKER    Conclusion   Conclusion: successful insertion of a medtronic active fixation temp-perm PM in a patient with high grade heart block prior to aortic valvuloplasty.   [name]   _______________________       TAVR OPERATIVE NOTE     Date of Procedure:                10/18/2020   Preoperative Diagnosis:      Severe Aortic Stenosis    Postoperative Diagnosis:    Same    Procedure:        Transcatheter Balloon Aortic Valvulplasty - Percutaneous  Transfemoral Approach             Bard True balloon 26 mm              Co-Surgeons:                        Gaye Pollack, MD, Lenna Sciara, MD, and Sherren Mocha, MD   Anesthesiologist:                  Donnelly Angelica, MD   Echocardiographer:              Marry Guan, MD   Pre-operative Echo Findings: Severe paravalvular aortic  insufficiency Moderate global LV systolic dysfunction   Post-operative Echo Findings: Mild residual paravalvular and central aortic insufficiency Unchanged left ventricular systolic function   ______________________     Echo 10/19/20: IMPRESSIONS   1. Left ventricular ejection fraction, by estimation, is 30 to 35%. The  left ventricle has moderately decreased function. The left ventricle  demonstrates global hypokinesis. The left ventricular internal cavity size  was moderately dilated. There is  moderate eccentric left ventricular hypertrophy. Left ventricular  diastolic parameters are consistent with Grade I diastolic dysfunction  (impaired relaxation).   2. Right ventricular systolic function is mildly reduced. The right  ventricular size is normal. There is normal pulmonary artery systolic  pressure.   3. Left atrial size was severely dilated.   4. Right atrial size was mildly dilated.   5. The mitral valve is normal in structure. Trivial mitral valve  regurgitation.   6. There is residual perivalvular aortic insufficiency, improved from the  previous study. There is mild perivalvular insufficiency in the vicinity  of the left coronary ostium. There is  trivial leak towards the mitral  annulus. The aortic valve has been  repaired/replaced. Aortic valve regurgitation is mild. There is a 25 mm  [name] valve present in the aortic position. Procedure Date: 03/06/19.  Aortic regurgitation PHT measures 564 msec. Aortic valve mean gradient  measures 13.0 mmHg. Aortic valve Vmax  measures 2.39 m/s. Aortic valve acceleration time measures 85 msec.   7. Aortic dilatation noted. There is moderate dilatation of the aortic  root, measuring 47 mm. There is moderate dilatation of the ascending  aorta, measuring 46 mm.   8. The inferior vena cava is dilated in size with >50% respiratory  variability, suggesting right atrial pressure of 8 mmHg.   Comparison(s): Prior images reviewed side by  side. There is marked  improvement in the severity of the aortic insufficiency and marked  reduction in the aortic prosthesis gradients.   ______________________  10/20/20 CONCLUSIONS:  1. Successful implantation of a St Jude CRT-D for ICM and CHB 2. Successful removal of a temporary transvenous pacemaker lead 3. No early apparent complications.     History of Present Illness     Gregory [name] is a 70 y.o. male with a history of CAD and aortic valve disease s/p CABG/AVR 03/06/2019 with a 25 mm [name] Intuity rapid deployment valve and CABG x 3 with a left radial to PDA, LIMA-LAD, and pedicled RIMA-RPL by Dr. Julien Girt, TAA, post op HIT, HTN, obesity, chronic combined S/D CHF with recent admission for acute on chronic combined S/D CHF, HAVB, worsening anemia and found to have severe bioprosthetic valve dysfunction with severe perivalvular leaking and hemolytic anemia who presented to [facility] on 10/17/20 for planned temp perm pacemaker placement followed by BAV and then permanent pacemaker placement.   The patient underwent CABG/AVR 03/06/2019 with a 25 mm [name] Intuity rapid deployment valve and CABG x 3 with a left radial to PDA, LIMA-LAD, and pedicled RIMA - RPL. The patient's postoperative course was complicated by heparin-induced thrombocytopenia.  He was ultimately treated with several months of warfarin and recovered uneventfully.   He was in his usual state of health until the last several months when he developed progressive fatigue, exertional dyspnea, fatigue, palpitations as well as anemia.    He was admitted in 09/2020 for 2-1 heart block discovered on an outpatient monitor as well as symptoms of volume overload. During his admission he was diuresed with IV lasix and seen by electrophysiology. Repeat echo showed EF 40-45%, LV dilation, mild LVH, mild aortic root dilatation (4.71mm) and moderate ascending aorta dilation (55mm) as well as severe paravalvular regurgitation in 2 different  areas around the sewing ring of his bovine bioprosthesis. Gated cardiac CT done during this admission showed a 25 mm [name] Intuity bioprosthetic valve is present in the aortic position. The leaflets of the prosthesis are freely mobile without evidence of leaflet thickening or thrombosis. There are 2 areas of paravalvular leak under the RCC and LCC related. The leak under the RCC measures ~3.7 mm x ~8.9 mm. The leak under the [place] is ~3.8 mm x 9.1 mm. The leak under then [facility] is likely related to heavy annular calcifications under the [place]. 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 Sinotubular Junction: 41 mm (double oblique). Ascending Thoracic Aorta: 41 mm (double oblique). Severely dilated left ventricular cavity. CTA of the aorta and iliac vessels demonstrate what appear to be adequate pelvic vascular access to facilitate a transfemoral approach.     The patient was feeling better after diuresis  and was ultimately discharge home. Permanent pacing was not recommended given possible need for redo surgery.    He was seen in the office by Dr. Burt Knack on 10/06/20. Dr. Burt Knack felt he needed to undergo further evaluation with a transesophageal echo study to further assess the mechanism of his paravalvular regurgitation.  It appears that there is a gap around both the left coronary cusp and the right coronary cusp of the aortic valve annulus causing to separate leaks, cumulatively resulting in a large leak.  If we treat him percutaneously, we would consider placement of a temp/perm pacemaker lead followed by balloon valvuloplasty of the surgical sewing ring in order to better expand the surgical prosthesis and potentially seal the paravalvular leaks.  Placement of the temp/perm pacemaker would be considered because the likelihood of complete AV block and pacemaker dependence would be exceedingly high after this procedure.  It is possible that following balloon valvuloplasty, the patient could need valve in  valve TAVR if there is leaflet damage or central regurgitation.  It is also possible that he would need paravalvular leak closure if the balloon valvuloplasty procedure is unsuccessful.  After his valve procedure is completed, he would require permanent pacemaker placement. He also recommended a L/RCH to assess his coronary anatomy and bypass grafts as well as hemodynamics.    Most recent labs showed a creat of 1.23, GFR 62, K 5.2, Na 141, Hg 10.2, Haptoglobin <10 and LDH 939.    TEE showed EF 30-35%, mild RV enlargement/dysfunction, severe AI with severe periannular regurgitation seen at least 3 areas around right and left sinus. Poor stent skirt appostion below annulus. Also, appears to be limited motion of one of the leaflets. Elevated transgastric gradient mean 39 peak 86 mmHg likely reprsents combination of leaflet dysfunction and severe AR. Estimated AVA 1.64 cm2. Mild to moderate aortic valve stenosis. Of note the patient had bronchospasm and low sats throughout case.   L/RHC showed 3V native CAD with 3/3 patent bypass grafts with normal wedge and right heart pressures as well as normal cardiac output. Angiomax was used in substitution of heparin.  The patient has been evaluated by the multidisciplinary valve team and felt to have severe, symptomatic bioprosthetic valve dysfunction with severe perivalvular leaking. Dr. Cyndia Bent saw in consultation and thought his operative risk for redo sternotomy and redo aortic valve replacement would be significantly higher than that estimated by the STS risk profile especially considering his aortic root aneurysm, history of HIT preventing use of heparin, and patent bilateral internal mammary artery grafts with a radial artery graft coming off of the right internal mammary artery graft. He felt the best option for treating him was balloon dilatation of the Intuity valve to try to get better apposition of the stent cuff to the annulus. This was set up for  10/18/20.  Hospital Course     Consultants: EP   Severe bioprosthetic valve dysfunction with severe perivalvular leaking: s/p successful BAV with a 26 mm True Balloon in order to expand his surgical prosthesis, with a reduction from severe PVL to mild residual PVL with mild central AI. POD1 echo showed EF 30-35%, mild RV dysfunction, residual perivalvular aortic insufficiency, improved from the previous study. No diastolic murmur. Continue on Asprin alone. Plan for discharge home today with close follow up in the structural clinic on 11/02/20 ( same day as wound check).    HAVB: he underwent successful insertion of a medtronic active fixation temp-perm PM on 10/17 by Dr. Lovena Le followed  by [facility] Jude CRT-D by Dr. Quentin Ore on 10/20/20. CXR with no PTX.   Anemia: felt to mostly likely be related to hemolysis with Haptoglobin <10 and LDH 939. Hg is 8.5 today. Will continue to follow.    History of HIT: no heparin products used during admission.    Chronic combined S/D CHF: appears euvolemic. EF 30-35%. Continue Entresto 49-51 mg BID, spiro 25mg  daily, and lasix 40mg  AM/29m PM. Toprol Xl added after device placement.    CAD s/p CABG: recent cath with 3/3 patent bypass grafts. Continue medical therapy.    HTN: BP well controlled in the context of CHF medications.  _____________  Discharge Vitals Blood pressure 113/72, pulse 71, temperature 97.9 F (36.6 C), temperature source Oral, resp. rate 18, height 5\' 7"  (1.702 m), weight 97.6 kg, SpO2 95 %.  Filed Weights   10/18/20 1257 10/19/20 0331 10/21/20 0500  Weight: 97.3 kg 97.6 kg 97.6 kg   GEN: Well nourished, well developed, in no acute distress HEENT: normal Neck: no JVD or masses Cardiac: RRR; 1/6 SEM, no diastolic murmur. No rubs, or gallops,no edema  Respiratory:  clear to auscultation bilaterally, normal work of breathing GI: soft, nontender, nondistended, + BS MS: no deformity or atrophy Skin: warm and dry, no rash.  Groin sites clear  without hematoma. Left side with diffuse ecchymosis  Neuro:  Alert and Oriented x 3, Strength and sensation are intact Psych: euthymic mood, full affect  Labs & Radiologic Studies    CBC Recent Labs    10/20/20 0227 10/21/20 0101  WBC 6.3 6.3  HGB 8.3* 8.5*  HCT 27.9* 28.0*  MCV 98.6 96.9  PLT 144* 829*   Basic Metabolic Panel Recent Labs    10/20/20 0227 10/21/20 0101  NA 139 138  K 4.5 4.5  CL 106 105  CO2 24 27  GLUCOSE 110* 109*  BUN 16 15  CREATININE 0.84 0.91  CALCIUM 8.7* 8.9  MG 1.7  --    Liver Function Tests No results for input(s): AST, ALT, ALKPHOS, BILITOT, PROT, ALBUMIN in the last 72 hours. No results for input(s): LIPASE, AMYLASE in the last 72 hours. Cardiac Enzymes No results for input(s): CKTOTAL, CKMB, CKMBINDEX, TROPONINI in the last 72 hours. BNP Invalid input(s): POCBNP D-Dimer No results for input(s): DDIMER in the last 72 hours. Hemoglobin A1C No results for input(s): HGBA1C in the last 72 hours.  Fasting Lipid Panel No results for input(s): CHOL, HDL, LDLCALC, TRIG, CHOLHDL, LDLDIRECT in the last 72 hours. Thyroid Function Tests No results for input(s): TSH, T4TOTAL, T3FREE, THYROIDAB in the last 72 hours.  Invalid input(s): FREET3 _____________  DG Chest 2 View  Result Date: 10/17/2020 CLINICAL DATA:  Preoperative evaluation for aortic valve surgery EXAM: CHEST - 2 VIEW COMPARISON:  09/29/2020 FINDINGS: Cardiac shadow is enlarged but stable. Aortic calcifications are noted. Pacing device is now seen. Lungs are clear bilaterally. Aortic valve replacement is noted. No bony abnormality is seen. IMPRESSION: Stable cardiomegaly.  No acute abnormality noted. Electronically Signed   By: Inez Catalina M.D.   On: 10/17/2020 21:53   DG Chest 2 View  Result Date: 09/29/2020 CLINICAL DATA:  Shortness of breath EXAM: CHEST - 2 VIEW COMPARISON:  Chest x-ray dated March 16, 2019 FINDINGS: Increased cardiac contours when compared with 2021 prior  exams. Bibasilar opacities, likely atelectasis. No evidence of pleural effusion or pneumothorax. IMPRESSION: Increased cardiac contours when compared with 2021 prior exams. Correlate with echocardiography. Bibasilar atelectasis.  Lungs otherwise clear. Electronically  Signed   By: Yetta Glassman M.D.   On: 09/29/2020 08:18   CARDIAC CATHETERIZATION  Result Date: 10/17/2020 Conclusion: successful insertion of a medtronic active fixation temp-perm PM in a patient with high grade heart block prior to aortic valvuloplasty. Carleene Overlie Taylor,MD   CARDIAC CATHETERIZATION  Result Date: 10/10/2020 Conclusions: Severe native two-vessel coronary artery disease with chronic total occlusions of the mid LAD and mid RCA.  LCx has 50% proximal stenosis. Widely patent LIMA-LAD and RIMA Y-graft to RPL with anastomosed free radial to RPDA. Normal left and right heart filling pressures. Normal cardiac output. Moderate-severe aortic regurgitation/paravalvular leak, better assessed on today's transesophageal echocardiogram. Dilated thoracic aorta. Recommendations: Ongoing work-up of aortic valve disease per Drs. Jason Hauge and [place]. Maintain net even fluid balance. Aggressive secondary prevention of coronary artery disease. Nelva Bush, MD [facility]   PERIPHERAL VASCULAR CATHETERIZATION  Result Date: 10/10/2020 Conclusions: Severe native two-vessel coronary artery disease with chronic total occlusions of the mid LAD and mid RCA.  LCx has 50% proximal stenosis. Widely patent LIMA-LAD and RIMA Y-graft to RPL with anastomosed free radial to RPDA. Normal left and right heart filling pressures. Normal cardiac output. Moderate-severe aortic regurgitation/paravalvular leak, better assessed on today's transesophageal echocardiogram. Dilated thoracic aorta. Recommendations: Ongoing work-up of aortic valve disease per Drs. Pearl Berlinger and [place]. Maintain net even fluid balance. Aggressive secondary prevention of coronary artery  disease. Nelva Bush, MD [facility]   CT CORONARY [facility] W/CTA COR W/SCORE Lewanda Rife W/CM &/OR WO/CM  Addendum Date: 09/30/2020   ADDENDUM REPORT: 09/30/2020 20:00 CLINICAL DATA:  Prosthetic valve regurgitation. 25 mm [name] Intuity bioprosthetic valve with severe regurgitation. EXAM: Cardiac TAVR CT TECHNIQUE: A non-contrast, gated CT scan was obtained with axial slices of 3 mm through the heart for aortic valve calcium scoring. A 120 kV retrospective, gated, contrast cardiac scan was obtained. Gantry rotation speed was 250 msecs and collimation was 0.6 mm. Nitroglycerin was not given. The 3D data set was reconstructed in 5% intervals of the 0-95% of the R-R cycle. Systolic and diastolic phases were analyzed on a dedicated workstation using MPR, MIP, and VRT modes. The patient received 100 cc of contrast. FINDINGS: Image quality: Excellent. Noise artifact is: Limited. Aortic Prosthesis: A 25 mm [name] Intuity bioprosthetic valve is present in the aortic position. The leaflets of the prosthesis are freely mobile without evidence of leaflet thickening or thrombosis. There are 2 areas of paravalvular leak under the RCC and LCC related. The leak under the RCC measures ~3.7 mm x ~8.9 mm. The leak under the [place] is ~3.8 mm x 9.1 mm. The leak under then [facility] is likely related to heavy annular calcifications under the [place]. 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 Sinotubular Junction: 41 mm (double oblique). Ascending Thoracic Aorta: 41 mm (double oblique) Coronary Arteries: Normal coronary origin. Right dominance. The study was performed without use of NTG and is insufficient for plaque evaluation. A LIMA to the LAD is seen. A RIMA to the RCA is seen. 3-vessel coronary calcifications are seen in the native coronaries. Right Atrium: Right atrial size is dilated. Right Ventricle: The right ventricular cavity is dilated. Left Atrium: Left atrial size is normal in size with no left atrial appendage filling  defect. Left Ventricle: The ventricular cavity size is severely dilated however, the entire left ventricle was not included in the FOV. Pulmonary arteries: Dilated pulmonary artery suggestive of pulmonary hypertension. Pulmonary veins: Normal pulmonary venous drainage. Pericardium: Normal thickness with no significant effusion or calcium  present. Mitral Valve: The mitral valve is normal structure without significant calcification. Extra-cardiac findings: See attached radiology report for non-cardiac structures. IMPRESSION: 1. 25 mm [name] Intuity bioprosthetic valve with 2 identified paravalvular leaks under the RCC and LCC as detailed above. 2. 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 3. S/p CABG.  LIMA-LAD and RIMA-RCA are seen. 4. Severely dilated left ventricular cavity. 5. Dilated pulmonary artery suggestive of pulmonary hypertension. [place] T. Audie Box, MD Electronically Signed   By: Eleonore Chiquito M.D.   On: 09/30/2020 20:00   Result Date: 09/30/2020 EXAM: OVER-READ INTERPRETATION  CT CHEST The following report is an over-read performed by radiologist Dr. Vinnie Langton of [facility], [place] on 09/30/2020. This over-read does not include interpretation of cardiac or coronary anatomy or pathology. The coronary calcium score/coronary CTA interpretation by the cardiologist is attached. COMPARISON:  Chest CT 03/05/2019. FINDINGS: Extracardiac findings will be described separately under dictation for contemporaneously obtained CTA chest, abdomen and pelvis. IMPRESSION: Please see separate dictation for contemporaneously obtained CTA chest, abdomen and pelvis dated 09/30/2020 for full description of relevant extracardiac findings. Electronically Signed: By: Vinnie Langton M.D. On: 09/30/2020 13:45   DG Chest Port 1 View  Result Date: 10/18/2020 CLINICAL DATA:  Temporary placed maker placement EXAM: PORTABLE CHEST 1 VIEW COMPARISON:  Chest x-ray 10/17/2020. FINDINGS: Temporary pacemaker is  present with leads ending over the right ventricle. The heart is enlarged, unchanged. The lungs are clear. There is no pneumothorax or pleural effusion. No acute fractures are seen. There are healed left-sided rib fractures. IMPRESSION: 1. Stable cardiomegaly.  No evidence for pneumonia or edema. Electronically Signed   By: Ronney Asters M.D.   On: 10/18/2020 16:41   CT ANGIO CHEST AORTA W/CM & OR WO/CM  Result Date: 09/30/2020 CLINICAL DATA:  70 year old male with history of severe aortic stenosis. Preprocedural study prior to potential transcatheter aortic valve replacement (TAVR) procedure. EXAM: CT ANGIOGRAPHY CHEST, ABDOMEN AND PELVIS TECHNIQUE: Multidetector CT imaging through the chest, abdomen and pelvis was performed using the standard protocol during bolus administration of intravenous contrast. Multiplanar reconstructed images and MIPs were obtained and reviewed to evaluate the vascular anatomy. CONTRAST:  139mL OMNIPAQUE IOHEXOL 350 MG/ML SOLN COMPARISON:  Chest CT 03/05/2019. FINDINGS: CTA CHEST FINDINGS Cardiovascular: Heart size is mildly enlarged. There is no significant pericardial fluid, thickening or pericardial calcification. There is aortic atherosclerosis, as well as atherosclerosis of the great vessels of the mediastinum and the coronary arteries, including calcified atherosclerotic plaque in the left main, left anterior descending, left circumflex and right coronary arteries. Status post median sternotomy for CABG including LIMA to the LAD. Patient is also status post aortic valve replacement with what appears to be a stented bioprosthesis. 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 Mild aneurysmal dilatation of the ascending thoracic aorta (4.5 cm in diameter). Mediastinum/Lymph Nodes: No pathologically enlarged mediastinal or hilar lymph nodes. Esophagus is unremarkable in appearance. No axillary lymphadenopathy. Lungs/Pleura: No suspicious appearing  pulmonary nodules or masses are noted. No acute consolidative airspace disease. Small bilateral pleural effusions lying dependently. Musculoskeletal/Soft Tissues: There are no aggressive appearing lytic or blastic lesions noted in the visualized portions of the skeleton. CTA ABDOMEN AND PELVIS FINDINGS Hepatobiliary: No suspicious cystic or solid hepatic lesions. No intra or extrahepatic biliary ductal dilatation. Tiny calcified gallstone lying dependently in the gallbladder. No findings to suggest an acute cholecystitis at this time. Pancreas: No pancreatic mass. No pancreatic ductal dilatation. No pancreatic or peripancreatic  fluid collections or inflammatory changes. Spleen: Unremarkable. Adrenals/Urinary Tract: 1.4 cm low-attenuation lesion in the interpolar region of the left kidney is compatible with a simple cyst. Mild bilateral renal atrophy. Bilateral perinephric stranding (nonspecific). Right kidney and bilateral adrenal glands are otherwise unremarkable in appearance. No hydroureteronephrosis. Urinary bladder is normal in appearance. Stomach/Bowel: The appearance of the stomach is normal. No pathologic dilatation of small bowel or colon. Numerous colonic diverticulae are noted, without definite surrounding inflammatory changes to clearly indicate an acute diverticulitis at this time. Normal appendix. Vascular/Lymphatic: Aortic atherosclerosis, without evidence of aneurysm or dissection in the abdominal or pelvic vasculature. No lymphadenopathy noted in the abdomen or pelvis. Reproductive: Prostate gland and seminal vesicles are unremarkable in appearance. Other: No significant volume of ascites.  No pneumoperitoneum. Musculoskeletal: There are no aggressive appearing lytic or blastic lesions noted in the visualized portions of the skeleton. VASCULAR MEASUREMENTS PERTINENT TO TAVR: AORTA: Minimal Aortic Diameter-22 x 19 mm Severity of Aortic Calcification-moderate RIGHT PELVIS: Right Common Iliac Artery -  Minimal Diameter-11.8 x 13.3 mm Tortuosity-mild Calcification-moderate to severe Right External Iliac Artery - Minimal Diameter-10.9 x 11.3 mm Tortuosity-severe Calcification-none Right Common Femoral Artery - Minimal Diameter-11.9 x 10.9 mm Tortuosity-mild Calcification-mild LEFT PELVIS: Left Common Iliac Artery - Minimal Diameter-14.8 x 13.9 mm Tortuosity-mild Calcification-moderate to severe Left External Iliac Artery - Minimal Diameter-11.2 x 12.0 mm Tortuosity-moderate Calcification-none Left Common Femoral Artery - Minimal Diameter-10.7 x 10.9 mm Tortuosity-mild Calcification-mild Review of the MIP images confirms the above findings. IMPRESSION: 1. Vascular findings and measurements pertinent to potential TAVR procedure, as detailed above. 2. Status post aortic valve replacement with what appears to be a stented bioprosthesis. 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 Ascending thoracic aortic aneurysm. Recommend semi-annual imaging followup by CTA or MRA and referral to cardiothoracic surgery if not already obtained. This recommendation follows 2010 ACCF/AHA/AATS/ACR/ASA/SCA/SCAI/SIR/STS/SVM Guidelines for the Diagnosis and Management of Patients With Thoracic Aortic Disease. Circulation. 2010; 121: N235-T732. Aortic aneurysm NOS (ICD10-I71.9). 3. Aortic atherosclerosis, in addition to left main and 3 vessel coronary artery disease. Status post median sternotomy for CABG including LIMA to the LAD. 4. Mild cardiomegaly. 5. Small bilateral pleural effusions lying dependently. 6. Colonic diverticulosis without evidence of acute diverticulitis at this time. 7. Additional incidental findings, as above. Electronically Signed   By: Vinnie Langton M.D.   On: 09/30/2020 14:11   ECHOCARDIOGRAM COMPLETE  Result Date: 10/19/2020    ECHOCARDIOGRAM REPORT   Patient Name:   VINNY TARANTO Date of Exam: 10/19/2020 Medical Rec #:   [id number]         Height:       67.0 in Accession #:    [id number]        Weight:       215.2 lb Date of Birth:  [birth year]         BSA:          2.086 m Patient Age:    55 years          BP:           99/54 mmHg Patient Gender: M                 HR:           59 bpm. Exam Location:  Inpatient Procedure: 2D Echo, Cardiac Doppler and Color Doppler Indications:    Aortic regurgitation I35.1  History:  Patient has prior history of Echocardiogram examinations, most                 recent 10/18/2020. CAD, Aortic Valve Disease; Risk Factors:ETOH,                 Hypertension and Sleep Apnea.                 Aortic Valve: 25 mm [name] valve is present in the aortic                 position. Procedure Date: 03/06/19.  Sonographer:    Bernadene Person RDCS Referring Phys: [place]  1. Left ventricular ejection fraction, by estimation, is 30 to 35%. The left ventricle has moderately decreased function. The left ventricle demonstrates global hypokinesis. The left ventricular internal cavity size was moderately dilated. There is moderate eccentric left ventricular hypertrophy. Left ventricular diastolic parameters are consistent with Grade I diastolic dysfunction (impaired relaxation).  2. Right ventricular systolic function is mildly reduced. The right ventricular size is normal. There is normal pulmonary artery systolic pressure.  3. Left atrial size was severely dilated.  4. Right atrial size was mildly dilated.  5. The mitral valve is normal in structure. Trivial mitral valve regurgitation.  6. There is residual perivalvular aortic insufficiency, improved from the previous study. There is mild perivalvular insufficiency in the vicinity of the left coronary ostium. There is trivial leak towards the mitral annulus. The aortic valve has been repaired/replaced. Aortic valve regurgitation is mild. There is a 25 mm [name] valve present in the aortic position. Procedure Date: 03/06/19. Aortic  regurgitation PHT measures 564 msec. Aortic valve mean gradient measures 13.0 mmHg. Aortic valve Vmax measures 2.39 m/s. Aortic valve acceleration time measures 85 msec.  7. Aortic dilatation noted. There is moderate dilatation of the aortic root, measuring 47 mm. There is moderate dilatation of the ascending aorta, measuring 46 mm.  8. The inferior vena cava is dilated in size with >50% respiratory variability, suggesting right atrial pressure of 8 mmHg. Comparison(s): Prior images reviewed side by side. There is marked improvement in the severity of the aortic insufficiency and marked reduction in the aortic prosthesis gradients. FINDINGS  Left Ventricle: Left ventricular ejection fraction, by estimation, is 30 to 35%. The left ventricle has moderately decreased function. The left ventricle demonstrates global hypokinesis. The left ventricular internal cavity size was moderately dilated. There is moderate eccentric left ventricular hypertrophy. Abnormal (paradoxical) septal motion, consistent with RV pacemaker. Left ventricular diastolic parameters are consistent with Grade I diastolic dysfunction (impaired relaxation). Right Ventricle: The right ventricular size is normal. No increase in right ventricular wall thickness. Right ventricular systolic function is mildly reduced. There is normal pulmonary artery systolic pressure. The tricuspid regurgitant velocity is 2.32 m/s, and with an assumed right atrial pressure of 8 mmHg, the estimated right ventricular systolic pressure is 63.8 mmHg. Left Atrium: Left atrial size was severely dilated. Right Atrium: Right atrial size was mildly dilated. Pericardium: Trivial pericardial effusion is present. Mitral Valve: The mitral valve is normal in structure. Trivial mitral valve regurgitation. Tricuspid Valve: The tricuspid valve is normal in structure. Tricuspid valve regurgitation is mild. Aortic Valve: There is residual perivalvular aortic insufficiency, improved from the  previous study. There is mild perivalvular insufficiency in the vicinity of the left coronary ostium. There is trivial leak towards the mitral annulus. The aortic valve has been repaired/replaced. Aortic valve regurgitation is mild. Aortic regurgitation PHT measures 564 msec.  Aortic valve mean gradient measures 13.0 mmHg. Aortic valve peak gradient measures 22.9 mmHg. Aortic valve area, by VTI measures 1.42 cm. There is a 25 mm [name] valve present in the aortic position. Procedure Date: 03/06/19. Pulmonic Valve: The pulmonic valve was grossly normal. Pulmonic valve regurgitation is not visualized. Aorta: Aortic dilatation noted. There is moderate dilatation of the aortic root, measuring 47 mm. There is moderate dilatation of the ascending aorta, measuring 46 mm. Venous: The inferior vena cava is dilated in size with greater than 50% respiratory variability, suggesting right atrial pressure of 8 mmHg. IAS/Shunts: No atrial level shunt detected by color flow Doppler. Additional Comments: A device lead is visualized.  LEFT VENTRICLE PLAX 2D LVIDd:         6.40 cm LVIDs:         5.30 cm LV PW:         1.30 cm LV IVS:        1.50 cm LVOT diam:     2.10 cm LV SV:         64 LV SV Index:   31 LVOT Area:     3.46 cm  LV Volumes (MOD) LV vol d, MOD A2C: 264.0 ml LV vol d, MOD A4C: 231.0 ml LV vol s, MOD A2C: 165.0 ml LV vol s, MOD A4C: 159.0 ml LV SV MOD A2C:     99.0 ml LV SV MOD A4C:     231.0 ml LV SV MOD BP:      91.0 ml RIGHT VENTRICLE TAPSE (M-mode): 1.3 cm LEFT ATRIUM              Index        RIGHT ATRIUM           Index LA diam:        5.30 cm  2.54 cm/m   RA Area:     15.30 cm LA Vol (A2C):   102.0 ml 48.90 ml/m  RA Volume:   36.40 ml  17.45 ml/m LA Vol (A4C):   115.0 ml 55.13 ml/m LA Biplane Vol: 110.0 ml 52.73 ml/m  AORTIC VALVE AV Area (Vmax):    1.36 cm AV Area (Vmean):   1.24 cm AV Area (VTI):     1.42 cm AV Vmax:           239.25 cm/s AV Vmean:          162.500 cm/s AV VTI:            0.452 m AV  Peak Grad:      22.9 mmHg AV Mean Grad:      13.0 mmHg LVOT Vmax:         94.00 cm/s LVOT Vmean:        58.067 cm/s LVOT VTI:          0.185 m LVOT/AV VTI ratio: 0.41 AI PHT:            564 msec  AORTA Ao Root diam: 4.70 cm Ao Asc diam:  4.60 cm TRICUSPID VALVE TR Peak grad:   21.5 mmHg TR Vmax:        232.00 cm/s  SHUNTS Systemic VTI:  0.19 m Systemic Diam: 2.10 cm Sanda Klein MD Electronically signed by Sanda Klein MD Signature Date/Time: 10/19/2020/5:05:20 PM    Final    ECHOCARDIOGRAM COMPLETE  Result Date: 09/29/2020    ECHOCARDIOGRAM REPORT   Patient Name:   KENZEL RUESCH Date of Exam: 09/29/2020 Medical Rec #:  [id number]         Height:       66.0 in Accession #:    [id number]        Weight:       226.0 lb Date of Birth:  1950/06/25         BSA:          2.106 m Patient Age:    1 years          BP:           160/59 mmHg Patient Gender: M                 HR:           41 bpm. Exam Location:  Inpatient Procedure: 2D Echo, Cardiac Doppler and Color Doppler Indications:    Dyspnea R06.00  History:        Patient has prior history of Echocardiogram examinations, most                 recent 12/07/2019. CAD, Aortic Valve Disease, Arrythmias:LBBB and                 Complete Heart Block, Signs/Symptoms:Shortness of Breath; Risk                 Factors:Hypertension, Dyslipidemia and Sleep Apnea.                 Aortic Valve: 25 mm INTUITY valve is present in the aortic                 position. Procedure Date: 03/06/2019.  Sonographer:    Darlina Sicilian RDCS Referring Phys: [id number] [place]  1. The aortic valve has been Intuity 25 mm valve in the aortic position. There are two peri-valvular jets that are moderate in severity; in summation suggestive of severe regurgitation. There is a 25 mm INTUITY valve present in the aortic position. Procedure Date: 03/06/2019.  2. Left ventricular ejection fraction, by estimation, is 40 to 45%. The left ventricle has mildly decreased function. The  left ventricle demonstrates global hypokinesis. The left ventricular internal cavity size was moderately dilated. There is mild concentric left ventricular hypertrophy. Left ventricular diastolic parameters are indeterminate.  3. Right ventricular systolic function is normal. The right ventricular size is normal. There is normal pulmonary artery systolic pressure.  4. Left atrial size was severely dilated.  5. The mitral valve is normal in structure. No evidence of mitral valve regurgitation.  6. Aortic dilatation noted. There is mild dilatation of the aortic root, measuring 44 mm. There is moderate dilatation of the ascending aorta, measuring 45 mm. Comparison(s): A prior study was performed on 12/07/2019. LV function is slightly worse, LV dilation has dilated. Comparison of the apical 5 chamber view and PSAX view (which does not show both jets) shows worse regurgitation despite being done at a lower  Nyquist limit. In comparison to post operative TEE, perivalvular leak which was present psot op has worsened. Conclusion(s)/Recommendation(s): TEE or cardiac MRI could be considered for further quantification of valve dysfunction. FINDINGS  Left Ventricle: Left ventricular ejection fraction, by estimation, is 40 to 45%. The left ventricle has mildly decreased function. The left ventricle demonstrates global hypokinesis. The left ventricular internal cavity size was moderately dilated. There is mild concentric left ventricular hypertrophy. Abnormal (paradoxical) septal motion, consistent with left bundle branch block. Left ventricular diastolic parameters are indeterminate. Right Ventricle: The right ventricular size is normal. Right vetricular wall  thickness was not well visualized. Right ventricular systolic function is normal. There is normal pulmonary artery systolic pressure. The tricuspid regurgitant velocity is 2.62 m/s, and with an assumed right atrial pressure of 3 mmHg, the estimated right ventricular systolic  pressure is 50.0 mmHg. Left Atrium: Left atrial size was severely dilated. Right Atrium: Right atrial size was normal in size. Pericardium: There is no evidence of pericardial effusion. Mitral Valve: The mitral valve is normal in structure. No evidence of mitral valve regurgitation. Tricuspid Valve: The tricuspid valve is normal in structure. Tricuspid valve regurgitation is trivial. Aortic Valve: The aortic valve has been repaired/replaced. Aortic valve regurgitation is severe. Aortic regurgitation PHT measures 691 msec. Aortic valve mean gradient measures 19.0 mmHg. Aortic valve peak gradient measures 42.0 mmHg. Aortic valve area, by VTI measures 1.70 cm. There is a 25 mm INTUITY valve present in the aortic position. Procedure Date: 03/06/2019. Pulmonic Valve: The pulmonic valve was not well visualized. Pulmonic valve regurgitation is trivial. No evidence of pulmonic stenosis. Aorta: Aortic dilatation noted. There is mild dilatation of the aortic root, measuring 44 mm. There is moderate dilatation of the ascending aorta, measuring 45 mm. IAS/Shunts: The atrial septum is grossly normal.  LEFT VENTRICLE PLAX 2D LVIDd:         6.70 cm      Diastology LVIDs:         5.30 cm      LV e' medial:    6.57 cm/s LV PW:         1.30 cm      LV E/e' medial:  17.5 LV IVS:        1.50 cm      LV e' lateral:   9.02 cm/s LVOT diam:     2.60 cm      LV E/e' lateral: 12.8 LV SV:         98 LV SV Index:   47 LVOT Area:     5.31 cm  LV Volumes (MOD) LV vol d, MOD A2C: 390.0 ml LV vol d, MOD A4C: 355.0 ml LV vol s, MOD A2C: 181.0 ml LV vol s, MOD A4C: 201.0 ml LV SV MOD A2C:     209.0 ml LV SV MOD A4C:     355.0 ml LV SV MOD BP:      180.5 ml RIGHT VENTRICLE RV S prime:     8.66 cm/s TAPSE (M-mode): 1.5 cm LEFT ATRIUM              Index       RIGHT ATRIUM           Index LA diam:        5.40 cm  2.56 cm/m  RA Area:     15.10 cm LA Vol (A2C):   128.0 ml 60.77 ml/m RA Volume:   32.90 ml  15.62 ml/m LA Vol (A4C):   139.0 ml 65.99  ml/m LA Biplane Vol: 136.0 ml 64.57 ml/m  AORTIC VALVE AV Area (Vmax):    1.35 cm AV Area (Vmean):   1.57 cm AV Area (VTI):     1.70 cm AV Vmax:           324.00 cm/s AV Vmean:          183.200 cm/s AV VTI:            0.580 m AV Peak Grad:      42.0 mmHg AV Mean Grad:      19.0 mmHg  LVOT Vmax:         82.50 cm/s LVOT Vmean:        54.250 cm/s LVOT VTI:          0.186 m LVOT/AV VTI ratio: 0.32 AI PHT:            691 msec  AORTA Ao Root diam: 4.40 cm Ao Asc diam:  4.55 cm MITRAL VALVE                TRICUSPID VALVE MV Area (PHT): 3.92 cm     TR Peak grad:   27.5 mmHg MV Decel Time: 194 msec     TR Vmax:        262.00 cm/s MV E velocity: 115.08 cm/s                             SHUNTS                             Systemic VTI:  0.19 m                             Systemic Diam: 2.60 cm Rudean Haskell MD Electronically signed by Rudean Haskell MD Signature Date/Time: 09/29/2020/2:43:41 PM    Final    ECHO TEE  Result Date: 10/18/2020    TRANSESOPHOGEAL ECHO REPORT   Patient Name:   RONDEL EPISCOPO Date of Exam: 10/18/2020 Medical Rec #:  [id number]         Height:       67.0 in Accession #:    [id number]        Weight:       206.3 lb Date of Birth:  13-Jun-1950         BSA:          2.049 m Patient Age:    71 years          BP:           122/62 mmHg Patient Gender: M                 HR:           60 bpm. Exam Location:  Inpatient Procedure: Transesophageal Echo, 3D Echo, Cardiac Doppler and Color Doppler Indications:     I35.1 Nonrheumatic aortic (valve) insufficiency  History:         Patient has prior history of Echocardiogram examinations, most                  recent 10/10/2020. Cardiomyopathy, Previous Myocardial                  Infarction, Prior CABG and Abnormal ECG, Aortic Valve Disease;                  Risk Factors:Hypertension and Sleep Apnea. Aortic stenosis.  Sonographer:     Roseanna Rainbow RDCS Referring Phys:  [id number] Woodfin Ganja THOMPSON Diagnosing Phys: Eleonore Chiquito MD PROCEDURE: After  discussion of the risks and benefits of a TEE, an informed consent was obtained from the patient. The transesophogeal probe was passed without difficulty through the esophogus of the patient. Sedation performed by different physician. The patient was monitored while under deep sedation. Image quality was excellent. The patient developed no complications during the procedure. IMPRESSIONS  1. A 25 mm [name] Intuity valve (stented  surgical prosthesis) is present in the aortic position. There were 2 paravalvular leaks around the stented frame with severe regurgitation in total. The valve leaflets were freely movable and normal. The valve was ballooned under direct echo visualization. After ballooning of the valve, the 2 paravalvular leaks remained but were lessend to trivial. The leaks were under the RCC and under the NCC/LCC. There was also mild central regurgitation after ballooning of  the valve. The MG was reduced from 20 mmHG to 10 mmHG. Vmax 2.2 m/s, MG 10 mmHG, EOA 2.72 cm2, DI 0.60. With the trivial paravalvular leaks and mild central regurgitation, a total of mild to moderate (likely mild) aortic regurgitation remained. The aortic valve has been repaired/replaced. Aortic valve regurgitation is mild to moderate. Procedure Date: 03/06/2019.  2. Left ventricular ejection fraction, by estimation, is 30 to 35%. The left ventricle has moderately decreased function. The left ventricle demonstrates global hypokinesis. The left ventricular internal cavity size was moderately to severely dilated.  3. Right ventricular systolic function is mildly reduced. The right ventricular size is moderately enlarged.  4. Left atrial size was mildly dilated.  5. The mitral valve is grossly normal. Mild mitral valve regurgitation. No evidence of mitral stenosis.  6. Aortic dilatation noted. Aneurysm of the aortic root, measuring 48 mm. There is Moderate (Grade III) layered plaque involving the descending aorta and transverse aorta.  FINDINGS  Left Ventricle: Left ventricular ejection fraction, by estimation, is 30 to 35%. The left ventricle has moderately decreased function. The left ventricle demonstrates global hypokinesis. The left ventricular internal cavity size was moderately to severely dilated. Right Ventricle: The right ventricular size is moderately enlarged. No increase in right ventricular wall thickness. Right ventricular systolic function is mildly reduced. Left Atrium: Left atrial size was mildly dilated. Right Atrium: Right atrial size was normal in size. Pericardium: There is no evidence of pericardial effusion. Mitral Valve: The mitral valve is grossly normal. Mild mitral valve regurgitation. No evidence of mitral valve stenosis. Tricuspid Valve: The tricuspid valve is grossly normal. Tricuspid valve regurgitation is trivial. Aortic Valve: A 25 mm [name] Intuity valve (stented surgical prosthesis) is present in the aortic position. There were 2 paravalvular leaks around the stented frame with severe regurgitation in total. The valve leaflets were freely movable and normal. The valve was ballooned under direct echo visualization. After ballooning of the valve, the 2 paravalvular leaks remained but were lessend to trivial. The leaks were under the RCC and under the NCC/LCC. There was also mild central regurgitation after ballooning of the valve. The MG was reduced from 20 mmHG to 10 mmHG. Vmax 2.2 m/s, MG 10 mmHG, EOA 2.72 cm2, DI 0.60. With the trivial paravalvular leaks and mild central regurgitation, a total of mild to moderate (likely mild) aortic regurgitation remained. The aortic valve has been repaired/replaced. Aortic valve regurgitation is mild to moderate. Aortic regurgitation PHT measures 1698 msec. Aortic valve mean gradient measures 10.0 mmHg. Aortic valve peak gradient measures 20.2 mmHg. Aortic valve  area, by VTI measures 2.72 cm. There is a 25 mm bioprosthetic valve present in the aortic position. Pulmonic  Valve: The pulmonic valve was grossly normal. Pulmonic valve regurgitation is not visualized. No evidence of pulmonic stenosis. Aorta: Aortic dilatation noted. There is an aneurysm involving the aortic root measuring 48 mm. There is moderate (Grade III) layered plaque involving the descending aorta and transverse aorta. IAS/Shunts: The atrial septum is grossly normal.  LEFT VENTRICLE PLAX 2D LVOT diam:     2.40  cm LV SV:         126 LV SV Index:   61 LVOT Area:     4.52 cm  AORTIC VALVE AV Area (Vmax):    2.81 cm AV Area (Vmean):   2.54 cm AV Area (VTI):     2.72 cm AV Vmax:           225.00 cm/s AV Vmean:          162.750 cm/s AV VTI:            0.463 m AV Peak Grad:      20.2 mmHg AV Mean Grad:      10.0 mmHg LVOT Vmax:         140.00 cm/s LVOT Vmean:        91.300 cm/s LVOT VTI:          0.278 m LVOT/AV VTI ratio: 0.60 AI PHT:            1698 msec  SHUNTS Systemic VTI:  0.28 m Systemic Diam: 2.40 cm Eleonore Chiquito MD Electronically signed by Eleonore Chiquito MD Signature Date/Time: 10/18/2020/3:17:12 PM    Final    ECHO TEE  Result Date: 10/10/2020    TRANSESOPHOGEAL ECHO REPORT   Patient Name:   DESHONE LYSSY Date of Exam: 10/10/2020 Medical Rec #:  [id number]         Height:       67.0 in Accession #:    [id number]        Weight:       217.4 lb Date of Birth:  1950/10/11         BSA:          2.095 m Patient Age:    36 years          BP:           122/65 mmHg Patient Gender: M                 HR:           80 bpm. Exam Location:  Inpatient Procedure: 3D Echo, Transesophageal Echo, Cardiac Doppler and Color Doppler Indications:     I35.1 Nonrheumatic aortic (valve) insufficiency  History:         Patient has prior history of Echocardiogram examinations. CHF                  and Cardiomyopathy, Previous Myocardial Infarction, Prior CABG                  and Abnormal ECG, Aortic Valve Disease, Arrythmias:Atrial                  Fibrillation, Signs/Symptoms:Murmur; Risk Factors:Dyslipidemia,                   Hypertension and Sleep Apnea.                  Aortic Valve: 25 mm Intuity valve is present in the aortic                  position. Procedure Date: 03/06/19.  Sonographer:     Roseanna Rainbow RDCS Referring Phys:  [place] Diagnosing Phys: Jenkins Rouge MD PROCEDURE: After discussion of the risks and benefits of a TEE, an informed consent was obtained from the patient. The transesophogeal probe was passed without difficulty through the esophogus of the patient. Imaged were obtained with the patient in a left lateral  decubitus position. Sedation performed by performing physician. The patient was monitored while under deep sedation. Anesthestetic sedation was provided intravenously by Anesthesiology: 264mg  of Propofol. The patient developed Respiratory depression during the procedure. IMPRESSIONS  1. Exam limited by patient bronchospasm and desats with coughing througout study despite propofol.  2. Left ventricular ejection fraction, by estimation, is 30 to 35%. The left ventricle has normal function. The left ventricle demonstrates global hypokinesis. The left ventricular internal cavity size was severely dilated. Left ventricular diastolic function could not be evaluated.  3. Right ventricular systolic function is mildly reduced. The right ventricular size is mildly enlarged.  4. Left atrial size was moderately dilated. No left atrial/left atrial appendage thrombus was detected.  5. Right atrial size was mildly dilated.  6. The mitral valve is abnormal. Mild mitral valve regurgitation.  7. 25 mm Intuity hybrid valve. Severe peri annular regurgitatiion seen at least 3 areas around right and left sinus. Poor stent skirt appostion below annulus. Also appears to be limited motion of one of the leaflets. Elevated transgastric gradient mean 39 peak 86 mmHg likely reprsents combination of leaflet dysfunction and severe AR Estimted AVA 1.64 cm2 . The aortic valve has been repaired/replaced. Aortic valve regurgitation is  severe. Mild to moderate aortic valve stenosis. There is a 25 mm Intuity valve present in the aortic position. Procedure Date: 03/06/19. FINDINGS  Left Ventricle: Left ventricular ejection fraction, by estimation, is 30 to 35%. The left ventricle has normal function. The left ventricle demonstrates global hypokinesis. The left ventricular internal cavity size was severely dilated. Left ventricular  diastolic function could not be evaluated. Right Ventricle: The right ventricular size is mildly enlarged. Right vetricular wall thickness was not assessed. Right ventricular systolic function is mildly reduced. Left Atrium: Left atrial size was moderately dilated. No left atrial/left atrial appendage thrombus was detected. Right Atrium: Right atrial size was mildly dilated. Pericardium: There is no evidence of pericardial effusion. Mitral Valve: The mitral valve is abnormal. Mild mitral valve regurgitation. Tricuspid Valve: The tricuspid valve is normal in structure. Tricuspid valve regurgitation is mild. Aortic Valve: 25 mm Intuity hybrid valve. Severe peri annular regurgitatiion seen at least 3 areas around right and left sinus. Poor stent skirt appostion below annulus. Also appears to be limited motion of one of the leaflets. Elevated transgastric gradient mean 39 peak 86 mmHg likely reprsents combination of leaflet dysfunction and severe AR Estimted AVA 1.64 cm2. The aortic valve has been repaired/replaced. Aortic valve regurgitation is severe. Mild to moderate aortic stenosis is present. Aortic valve mean gradient measures 39.0 mmHg. Aortic valve peak gradient measures 85.4 mmHg. Aortic valve area, by VTI measures 1.32 cm. There is a 25 mm Intuity valve present in the aortic position. Procedure Date: 03/06/19. Pulmonic Valve: The pulmonic valve was grossly normal. Pulmonic valve regurgitation is mild. Aorta: The aortic root was not well visualized. IAS/Shunts: No atrial level shunt detected by color flow Doppler.  Additional Comments: Exam limited by patient bronchospasm and desats with coughing througout study despite propofol.  LEFT VENTRICLE PLAX 2D LVOT diam:     2.19 cm LV SV:         135 LV SV Index:   64 LVOT Area:     3.77 cm  AORTIC VALVE AV Area (Vmax):    1.64 cm AV Area (Vmean):   1.77 cm AV Area (VTI):     1.32 cm AV Vmax:           462.00 cm/s AV Vmean:  285.000 cm/s AV VTI:            1.020 m AV Peak Grad:      85.4 mmHg AV Mean Grad:      39.0 mmHg LVOT Vmax:         201.00 cm/s LVOT Vmean:        134.000 cm/s LVOT VTI:          0.357 m LVOT/AV VTI ratio: 0.35  AORTA Ao Asc diam: 3.59 cm  SHUNTS Systemic VTI:  0.36 m Systemic Diam: 2.19 cm Jenkins Rouge MD Electronically signed by Jenkins Rouge MD Signature Date/Time: 10/10/2020/2:51:13 PM    Final    LONG TERM MONITOR (3-14 DAYS)  Result Date: 09/28/2020 Predominant rhythm sinus bradycardia High degree heart block Mobitz Type I and II with probable episodes of complete heart block Junctional rhythm  CT Angio Abd/Pel w/ and/or w/o  Result Date: 09/30/2020 CLINICAL DATA:  69 year old male with history of severe aortic stenosis. Preprocedural study prior to potential transcatheter aortic valve replacement (TAVR) procedure. EXAM: CT ANGIOGRAPHY CHEST, ABDOMEN AND PELVIS TECHNIQUE: Multidetector CT imaging through the chest, abdomen and pelvis was performed using the standard protocol during bolus administration of intravenous contrast. Multiplanar reconstructed images and MIPs were obtained and reviewed to evaluate the vascular anatomy. CONTRAST:  166mL OMNIPAQUE IOHEXOL 350 MG/ML SOLN COMPARISON:  Chest CT 03/05/2019. FINDINGS: CTA CHEST FINDINGS Cardiovascular: Heart size is mildly enlarged. There is no significant pericardial fluid, thickening or pericardial calcification. There is aortic atherosclerosis, as well as atherosclerosis of the great vessels of the mediastinum and the coronary arteries, including calcified atherosclerotic plaque in  the left main, left anterior descending, left circumflex and right coronary arteries. Status post median sternotomy for CABG including LIMA to the LAD. Patient is also status post aortic valve replacement with what appears to be a stented bioprosthesis. 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 Mild aneurysmal dilatation of the ascending thoracic aorta (4.5 cm in diameter). Mediastinum/Lymph Nodes: No pathologically enlarged mediastinal or hilar lymph nodes. Esophagus is unremarkable in appearance. No axillary lymphadenopathy. Lungs/Pleura: No suspicious appearing pulmonary nodules or masses are noted. No acute consolidative airspace disease. Small bilateral pleural effusions lying dependently. Musculoskeletal/Soft Tissues: There are no aggressive appearing lytic or blastic lesions noted in the visualized portions of the skeleton. CTA ABDOMEN AND PELVIS FINDINGS Hepatobiliary: No suspicious cystic or solid hepatic lesions. No intra or extrahepatic biliary ductal dilatation. Tiny calcified gallstone lying dependently in the gallbladder. No findings to suggest an acute cholecystitis at this time. Pancreas: No pancreatic mass. No pancreatic ductal dilatation. No pancreatic or peripancreatic fluid collections or inflammatory changes. Spleen: Unremarkable. Adrenals/Urinary Tract: 1.4 cm low-attenuation lesion in the interpolar region of the left kidney is compatible with a simple cyst. Mild bilateral renal atrophy. Bilateral perinephric stranding (nonspecific). Right kidney and bilateral adrenal glands are otherwise unremarkable in appearance. No hydroureteronephrosis. Urinary bladder is normal in appearance. Stomach/Bowel: The appearance of the stomach is normal. No pathologic dilatation of small bowel or colon. Numerous colonic diverticulae are noted, without definite surrounding inflammatory changes to clearly indicate an acute diverticulitis at this time. Normal appendix.  Vascular/Lymphatic: Aortic atherosclerosis, without evidence of aneurysm or dissection in the abdominal or pelvic vasculature. No lymphadenopathy noted in the abdomen or pelvis. Reproductive: Prostate gland and seminal vesicles are unremarkable in appearance. Other: No significant volume of ascites.  No pneumoperitoneum. Musculoskeletal: There are no aggressive appearing lytic or blastic lesions noted in the visualized portions of  the skeleton. VASCULAR MEASUREMENTS PERTINENT TO TAVR: AORTA: Minimal Aortic Diameter-22 x 19 mm Severity of Aortic Calcification-moderate RIGHT PELVIS: Right Common Iliac Artery - Minimal Diameter-11.8 x 13.3 mm Tortuosity-mild Calcification-moderate to severe Right External Iliac Artery - Minimal Diameter-10.9 x 11.3 mm Tortuosity-severe Calcification-none Right Common Femoral Artery - Minimal Diameter-11.9 x 10.9 mm Tortuosity-mild Calcification-mild LEFT PELVIS: Left Common Iliac Artery - Minimal Diameter-14.8 x 13.9 mm Tortuosity-mild Calcification-moderate to severe Left External Iliac Artery - Minimal Diameter-11.2 x 12.0 mm Tortuosity-moderate Calcification-none Left Common Femoral Artery - Minimal Diameter-10.7 x 10.9 mm Tortuosity-mild Calcification-mild Review of the MIP images confirms the above findings. IMPRESSION: 1. Vascular findings and measurements pertinent to potential TAVR procedure, as detailed above. 2. Status post aortic valve replacement with what appears to be a stented bioprosthesis. 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 Ascending thoracic aortic aneurysm. Recommend semi-annual imaging followup by CTA or MRA and referral to cardiothoracic surgery if not already obtained. This recommendation follows 2010 ACCF/AHA/AATS/ACR/ASA/SCA/SCAI/SIR/STS/SVM Guidelines for the Diagnosis and Management of Patients With Thoracic Aortic Disease. Circulation. 2010; 121: D176-H607. Aortic  aneurysm NOS (ICD10-I71.9). 3. Aortic atherosclerosis, in addition to left main and 3 vessel coronary artery disease. Status post median sternotomy for CABG including LIMA to the LAD. 4. Mild cardiomegaly. 5. Small bilateral pleural effusions lying dependently. 6. Colonic diverticulosis without evidence of acute diverticulitis at this time. 7. Additional incidental findings, as above. Electronically Signed   By: Vinnie Langton M.D.   On: 09/30/2020 14:11   Disposition   Pt is being discharged home today in good condition.  Follow-up Plans & Appointments     Follow-up Information     Eileen Stanford, PA-C. Go on 11/02/2020.   Specialties: Cardiology, Radiology Why: @ 3:30pm, please arrive at least 10 minutes early. Contact information: [address] [place] [address] [phone]         [facility] Follow up.   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   allopurinol 100 MG tablet Commonly known as: ZYLOPRIM Take 1 tablet (100 mg total) by mouth daily.   ALPRAZolam 0.5 MG tablet Commonly known as: XANAX TAKE 1 TABLET BY MOUTH ONCE DAILY AS NEEDED FOR ANXIETY TAKE  30  MINUTES  PRIOR  TO  PLANE  FLIGHT  OR  AS  NEEDED  FOR  ANXIETY   aspirin EC 81 MG tablet Take 1 tablet (81 mg total) by mouth daily. Swallow whole.   atorvastatin 80 MG tablet Commonly known as: LIPITOR Take 1 tablet (80 mg total) by mouth daily.   colchicine 0.6 MG tablet Take 2 tablets by mouth once  daily What changed:  how much to take additional instructions   furosemide 20 MG tablet Commonly known as: LASIX Take 2 tablets in the morning and 1 tablet in the afternoon   ketoconazole  2 % shampoo Commonly known as: NIZORAL Apply 1 application topically 2 (two) times a week. What changed: when to take this   latanoprost 0.005 % ophthalmic solution Commonly known as: XALATAN Place 1 drop into both eyes every morning.   metoprolol succinate 25 MG 24 hr tablet Commonly known as: TOPROL-XL Take 0.5 tablets (12.5 mg total) by mouth daily.   Osteo Bi-Flex/5-Loxin Advanced Tabs Take 2 tablets by mouth daily.   potassium chloride SA 20 MEQ tablet Commonly known as: KLOR-CON Take 1 tablet (20 mEq total) by mouth daily.   sacubitril-valsartan 49-51 MG Commonly known as: ENTRESTO Take 1 tablet by mouth 2 (two) times daily.   sertraline 50 MG tablet Commonly known as: ZOLOFT Take 1 tablet (50 mg total) by mouth daily.   spironolactone 25 MG tablet Commonly known as: ALDACTONE Take 1 tablet (25 mg total) by mouth daily.   traZODone 50 MG tablet Commonly known as: DESYREL Take 0.5-1 tablets (25-50 mg total) by mouth at bedtime as needed for sleep. What changed: how much to take          Outstanding Labs/Studies   none  Duration of Discharge Encounter   Greater than 30 minutes including physician time.   Patient seen, examined. Available data reviewed. Agree with findings, assessment, and plan as outlined by Nell Range, PA.  The patient is independently interviewed and examined this morning.  He is doing very well after implantation of a biventricular ICD yesterday.  His pacemaker site has very mild ecchymosis with no hematoma.  The heart is regular rate and rhythm with a 1/6 systolic murmur at the right upper sternal border, no diastolic murmur.  Lung fields are clear.  Abdomen is soft and nontender, right groin site is clear, the left groin site has healing  ecchymoses with no hematoma.  There is no lower extremity edema.  The patient's labs are reviewed.  His medical program is reviewed and I agree with his discharge medications as outlined above.  The patient's follow-up is arranged.  He has done remarkably well with balloon valvuloplasty to expand his surgical prosthesis and treat severe paravalvular regurgitation along with implantation of a biventricular ICD to treat his high-grade conduction disease.  Sherren Mocha, M.D. 10/21/2020 10:10 AM

## 2020-10-20 NOTE — Discharge Instructions (Addendum)
Groin Site Care Refer to this sheet in the next few weeks. These instructions provide you with information on caring for yourself after your procedure. Your caregiver may also give you more specific instructions. Your treatment has been planned according to current medical practices, but problems sometimes occur. Call your caregiver if you have any problems or questions after your procedure. HOME CARE INSTRUCTIONS You may shower 24 hours after the procedure. Remove the bandage (dressing) and gently wash the site with plain soap and water. Gently pat the site dry.  Do not apply powder or lotion to the site.  Do not sit in a bathtub, swimming pool, or whirlpool for 5 to 7 days.  No bending, squatting, or lifting anything over 10 pounds (4.5 kg) as directed by your caregiver.  Inspect the site at least twice daily.  Do not drive home if you are discharged the same day of the procedure. Have someone else drive you.  You may drive 24 hours after the procedure unless otherwise instructed by your caregiver.  What to expect: Any bruising will usually fade within 1 to 2 weeks.  Blood that collects in the tissue (hematoma) may be painful to the touch. It should usually decrease in size and tenderness within 1 to 2 weeks.  SEEK IMMEDIATE MEDICAL CARE IF: You have unusual pain at the groin site or down the affected leg.  You have redness, warmth, swelling, or pain at the groin site.  You have drainage (other than a small amount of blood on the dressing).  You have chills.  You have a fever or persistent symptoms for more than 72 hours.  You have a fever and your symptoms suddenly get worse.  Your leg becomes pale, cool, tingly, or numb.  You have heavy bleeding from the site. Hold pressure on the site. .  After Your ICD (Implantable Cardiac Defibrillator)   You have a [employer] ICD  ACTIVITY Do not lift your arm above shoulder height for 1 week after your procedure. After 7 days, you may progress as  below.  You should remove your sling 24 hours after your procedure, unless otherwise instructed by your provider.     Friday October 28, 2020  Saturday October 29, 2020 Sunday October 30, 2020 Monday October 31, 2020   Do not lift, push, pull, or carry anything over 10 pounds with the affected arm until 6 weeks (Friday December 02, 2020 ) after your procedure.   You may drive AFTER your wound check, unless you have been told otherwise by your provider.   Ask your healthcare provider when you can go back to work   INCISION/Dressing If you are on a blood thinner such as Coumadin, Xarelto, Eliquis, Plavix, or Pradaxa please confirm with your provider when this should be resumed.   If large square, outer bandage is left in place, this can be removed after 24 hours from your procedure. Do not remove steri-strips or glue as below.   Monitor your defibrillator site for redness, swelling, and drainage. Call the device clinic at [phone] if you experience these symptoms or fever/chills.  If your incision is sealed with Steri-strips or staples, you may shower 10 days after your procedure or when told by your provider. Do not remove the steri-strips or let the shower hit directly on your site. You may wash around your site with soap and water.    If you were discharged in a sling, please do not wear this during the day more than  48 hours after your surgery unless otherwise instructed. This may increase the risk of stiffness and soreness in your shoulder.   Avoid lotions, ointments, or perfumes over your incision until it is well-healed.  You may use a hot tub or a pool AFTER your wound check appointment if the incision is completely closed.  Your ICD is designed to protect you from life threatening heart rhythms. Because of this, you may receive a shock.   1 shock with no symptoms:  Call the office during business hours. 1 shock with symptoms (chest pain, chest pressure, dizziness,  lightheadedness, shortness of breath, overall feeling unwell):  Call 911. If you experience 2 or more shocks in 24 hours:  Call 911. If you receive a shock, you should not drive for 6 months per the [place] DMV IF you receive appropriate therapy from your ICD.   ICD Alerts:  Some alerts are vibratory and others beep. These are NOT emergencies. Please call our office to let us know. If this occurs at night or on weekends, it can wait until the next business day. Send a remote transmission.  If your device is capable of reading fluid status (for heart failure), you will be offered monthly monitoring to review this with you.   DEVICE MANAGEMENT Remote monitoring is used to monitor your ICD from home. This monitoring is scheduled every 91 days by our office. It allows [place] to keep an eye on the functioning of your device to ensure it is working properly. You will routinely see your Electrophysiologist annually (more often if necessary).   You should receive your ID card for your new device in 4-8 weeks. Keep this card with you at all times once received. Consider wearing a medical alert bracelet or necklace.  Your ICD  may be MRI compatible. This will be discussed at your next office visit/wound check.  You should avoid contact with strong electric or magnetic fields.   Do not use amateur (ham) radio equipment or electric (arc) welding torches. MP3 player headphones with magnets should not be used. Some devices are safe to use if held at least 12 inches (30 cm) from your defibrillator. These include power tools, lawn mowers, and speakers. If you are unsure if something is safe to use, ask your health care provider.  When using your cell phone, hold it to the ear that is on the opposite side from the defibrillator. Do not leave your cell phone in a pocket over the defibrillator.  You may safely use electric blankets, heating pads, computers, and microwave ovens.  Call the office right away if: You have  chest pain. You feel more than one shock. You feel more short of breath than you have felt before. You feel more light-headed than you have felt before. Your incision starts to open up.  This information is not intended to replace advice given to you by your health care provider. Make sure you discuss any questions you have with your health care provider.

## 2020-10-21 ENCOUNTER — Inpatient Hospital Stay (HOSPITAL_COMMUNITY): Payer: Medicare HMO

## 2020-10-21 ENCOUNTER — Encounter (HOSPITAL_COMMUNITY): Admission: RE | Payer: Self-pay | Source: Ambulatory Visit

## 2020-10-21 ENCOUNTER — Encounter (HOSPITAL_COMMUNITY): Payer: Self-pay | Admitting: Cardiology

## 2020-10-21 ENCOUNTER — Ambulatory Visit (HOSPITAL_COMMUNITY): Admission: RE | Admit: 2020-10-21 | Payer: Medicare HMO | Source: Ambulatory Visit | Admitting: Cardiovascular Disease

## 2020-10-21 DIAGNOSIS — Z95810 Presence of automatic (implantable) cardiac defibrillator: Secondary | ICD-10-CM

## 2020-10-21 DIAGNOSIS — I442 Atrioventricular block, complete: Secondary | ICD-10-CM | POA: Diagnosis not present

## 2020-10-21 DIAGNOSIS — I700 Atherosclerosis of aorta: Secondary | ICD-10-CM | POA: Diagnosis not present

## 2020-10-21 DIAGNOSIS — I517 Cardiomegaly: Secondary | ICD-10-CM | POA: Diagnosis not present

## 2020-10-21 DIAGNOSIS — Z952 Presence of prosthetic heart valve: Secondary | ICD-10-CM | POA: Diagnosis not present

## 2020-10-21 LAB — CBC
HCT: 28 % — ABNORMAL LOW (ref 39.0–52.0)
Hemoglobin: 8.5 g/dL — ABNORMAL LOW (ref 13.0–17.0)
MCH: 29.4 pg (ref 26.0–34.0)
MCHC: 30.4 g/dL (ref 30.0–36.0)
MCV: 96.9 fL (ref 80.0–100.0)
Platelets: 124 10*3/uL — ABNORMAL LOW (ref 150–400)
RBC: 2.89 MIL/uL — ABNORMAL LOW (ref 4.22–5.81)
RDW: 19.8 % — ABNORMAL HIGH (ref 11.5–15.5)
WBC: 6.3 10*3/uL (ref 4.0–10.5)
nRBC: 0 % (ref 0.0–0.2)

## 2020-10-21 LAB — BASIC METABOLIC PANEL
Anion gap: 6 (ref 5–15)
BUN: 15 mg/dL (ref 8–23)
CO2: 27 mmol/L (ref 22–32)
Calcium: 8.9 mg/dL (ref 8.9–10.3)
Chloride: 105 mmol/L (ref 98–111)
Creatinine, Ser: 0.91 mg/dL (ref 0.61–1.24)
GFR, Estimated: >60 mL/min (ref >60)
Glucose, Bld: 109 mg/dL — ABNORMAL HIGH (ref 70–99)
Potassium: 4.5 mmol/L (ref 3.5–5.1)
Sodium: 138 mmol/L (ref 135–145)

## 2020-10-21 SURGERY — ECHOCARDIOGRAM, TRANSESOPHAGEAL
Anesthesia: Monitor Anesthesia Care

## 2020-10-21 MED ORDER — METOPROLOL SUCCINATE ER 25 MG PO TB24
12.5000 mg | ORAL_TABLET | Freq: Every day | ORAL | Status: DC
  Administered 2020-10-21: 12.5 mg via ORAL
  Filled 2020-10-21: qty 1

## 2020-10-21 MED ORDER — METOPROLOL SUCCINATE ER 25 MG PO TB24: 12.5000 mg | ORAL_TABLET | Freq: Every day | ORAL | 3 refills | Status: DC

## 2020-10-21 NOTE — Plan of Care (Signed)
  Problem: Education: Goal: Knowledge of General Education information will improve Description: Including pain rating scale, medication(s)/side effects and non-pharmacologic comfort measures Outcome: Adequate for Discharge   

## 2020-10-21 NOTE — Progress Notes (Signed)
Electrophysiology Rounding Note  Patient Name: Gregory [name] Date of Encounter: 10/21/2020  Primary Cardiologist: [name], MD Electrophysiologist: Dr. Quentin Ore   Subjective   The patient is doing well today.  At this time, the patient denies chest pain, shortness of breath, or any new concerns. Slightly sore  Inpatient Medications    Scheduled Meds:  allopurinol  100 mg Oral Daily   aspirin EC  81 mg Oral Daily   atorvastatin  80 mg Oral Daily   furosemide  40 mg Oral Daily   latanoprost  1 drop Both Eyes Daily   sacubitril-valsartan  1 tablet Oral BID   sertraline  50 mg Oral Daily   sodium chloride flush  3 mL Intravenous Q12H   spironolactone  25 mg Oral Daily   Continuous Infusions:  sodium chloride     sodium chloride     nitroGLYCERIN     PRN Meds: sodium chloride, acetaminophen **OR** acetaminophen, acetaminophen, ALPRAZolam, morphine injection, ondansetron (ZOFRAN) IV, ondansetron (ZOFRAN) IV, oxyCODONE, sodium chloride flush, traMADol, traZODone   Vital Signs    Vitals:   10/21/20 0002 10/21/20 0317 10/21/20 0500 10/21/20 0819  BP: 103/84 118/67  98/72  Pulse: 70 70  76  Resp: 13 20  18   Temp: 98.2 F (36.8 C) (!) 97.5 F (36.4 C)  97.9 F (36.6 C)  TempSrc: Oral Oral  Oral  SpO2: 95% 95%  95%  Weight:   97.6 kg   Height:        Intake/Output Summary (Last 24 hours) at 10/21/2020 0826 Last data filed at 10/21/2020 0314 Gross per 24 hour  Intake --  Output 1050 ml  Net -1050 ml   Filed Weights   10/18/20 1257 10/19/20 0331 10/21/20 0500  Weight: 97.3 kg 97.6 kg 97.6 kg    Physical Exam    GEN- The patient is well appearing, alert and oriented x 3 today.   Head- normocephalic, atraumatic Eyes-  Sclera clear, conjunctiva pink Ears- hearing intact Oropharynx- clear Neck- supple Lungs- Clear to ausculation bilaterally, normal work of breathing Heart- Regular rate and rhythm, no murmurs, rubs or gallops GI- soft, NT, ND, +  BS Extremities- no clubbing or cyanosis. No edema Skin- no rash or lesion Psych- euthymic mood, full affect Neuro- strength and sensation are intact  Labs    CBC Recent Labs    10/20/20 0227 10/21/20 0101  WBC 6.3 6.3  HGB 8.3* 8.5*  HCT 27.9* 28.0*  MCV 98.6 96.9  PLT 144* 654*   Basic Metabolic Panel Recent Labs    10/20/20 0227 10/21/20 0101  NA 139 138  K 4.5 4.5  CL 106 105  CO2 24 27  GLUCOSE 110* 109*  BUN 16 15  CREATININE 0.84 0.91  CALCIUM 8.7* 8.9  MG 1.7  --    Liver Function Tests No results for input(s): AST, ALT, ALKPHOS, BILITOT, PROT, ALBUMIN in the last 72 hours. No results for input(s): LIPASE, AMYLASE in the last 72 hours. Cardiac Enzymes No results for input(s): CKTOTAL, CKMB, CKMBINDEX, TROPONINI in the last 72 hours.   Telemetry    BiV pacing60-70s mostly (personally reviewed)  Radiology    ECHOCARDIOGRAM COMPLETE  Result Date: 10/19/2020    ECHOCARDIOGRAM REPORT   Patient Name:   Gregory [name] Date of Exam: 10/19/2020 Medical Rec #:  [id number]         Height:       67.0 in Accession #:    [id number]  Weight:       215.2 lb Date of Birth:  Oct 01, 1950         BSA:          2.086 m Patient Age:    70 years          BP:           99/54 mmHg Patient Gender: M                 HR:           59 bpm. Exam Location:  Inpatient Procedure: 2D Echo, Cardiac Doppler and Color Doppler Indications:    Aortic regurgitation I35.1  History:        Patient has prior history of Echocardiogram examinations, most                 recent 10/18/2020. CAD, Aortic Valve Disease; Risk Factors:ETOH,                 Hypertension and Sleep Apnea.                 Aortic Valve: 25 mm [name] valve is present in the aortic                 position. Procedure Date: 03/06/19.  Sonographer:    Bernadene Person RDCS Referring Phys: [place]  1. Left ventricular ejection fraction, by estimation, is 30 to 35%. The left ventricle has moderately decreased  function. The left ventricle demonstrates global hypokinesis. The left ventricular internal cavity size was moderately dilated. There is moderate eccentric left ventricular hypertrophy. Left ventricular diastolic parameters are consistent with Grade I diastolic dysfunction (impaired relaxation).  2. Right ventricular systolic function is mildly reduced. The right ventricular size is normal. There is normal pulmonary artery systolic pressure.  3. Left atrial size was severely dilated.  4. Right atrial size was mildly dilated.  5. The mitral valve is normal in structure. Trivial mitral valve regurgitation.  6. There is residual perivalvular aortic insufficiency, improved from the previous study. There is mild perivalvular insufficiency in the vicinity of the left coronary ostium. There is trivial leak towards the mitral annulus. The aortic valve has been repaired/replaced. Aortic valve regurgitation is mild. There is a 25 mm [name] valve present in the aortic position. Procedure Date: 03/06/19. Aortic regurgitation PHT measures 564 msec. Aortic valve mean gradient measures 13.0 mmHg. Aortic valve Vmax measures 2.39 m/s. Aortic valve acceleration time measures 85 msec.  7. Aortic dilatation noted. There is moderate dilatation of the aortic root, measuring 47 mm. There is moderate dilatation of the ascending aorta, measuring 46 mm.  8. The inferior vena cava is dilated in size with >50% respiratory variability, suggesting right atrial pressure of 8 mmHg. Comparison(s): Prior images reviewed side by side. There is marked improvement in the severity of the aortic insufficiency and marked reduction in the aortic prosthesis gradients. FINDINGS  Left Ventricle: Left ventricular ejection fraction, by estimation, is 30 to 35%. The left ventricle has moderately decreased function. The left ventricle demonstrates global hypokinesis. The left ventricular internal cavity size was moderately dilated. There is moderate eccentric left  ventricular hypertrophy. Abnormal (paradoxical) septal motion, consistent with RV pacemaker. Left ventricular diastolic parameters are consistent with Grade I diastolic dysfunction (impaired relaxation). Right Ventricle: The right ventricular size is normal. No increase in right ventricular wall thickness. Right ventricular systolic function is mildly reduced. There is normal pulmonary artery systolic pressure. The tricuspid regurgitant  velocity is 2.32 m/s, and with an assumed right atrial pressure of 8 mmHg, the estimated right ventricular systolic pressure is 27.0 mmHg. Left Atrium: Left atrial size was severely dilated. Right Atrium: Right atrial size was mildly dilated. Pericardium: Trivial pericardial effusion is present. Mitral Valve: The mitral valve is normal in structure. Trivial mitral valve regurgitation. Tricuspid Valve: The tricuspid valve is normal in structure. Tricuspid valve regurgitation is mild. Aortic Valve: There is residual perivalvular aortic insufficiency, improved from the previous study. There is mild perivalvular insufficiency in the vicinity of the left coronary ostium. There is trivial leak towards the mitral annulus. The aortic valve has been repaired/replaced. Aortic valve regurgitation is mild. Aortic regurgitation PHT measures 564 msec. Aortic valve mean gradient measures 13.0 mmHg. Aortic valve peak gradient measures 22.9 mmHg. Aortic valve area, by VTI measures 1.42 cm. There is a 25 mm [name] valve present in the aortic position. Procedure Date: 03/06/19. Pulmonic Valve: The pulmonic valve was grossly normal. Pulmonic valve regurgitation is not visualized. Aorta: Aortic dilatation noted. There is moderate dilatation of the aortic root, measuring 47 mm. There is moderate dilatation of the ascending aorta, measuring 46 mm. Venous: The inferior vena cava is dilated in size with greater than 50% respiratory variability, suggesting right atrial pressure of 8 mmHg. IAS/Shunts: No  atrial level shunt detected by color flow Doppler. Additional Comments: A device lead is visualized.  LEFT VENTRICLE PLAX 2D LVIDd:         6.40 cm LVIDs:         5.30 cm LV PW:         1.30 cm LV IVS:        1.50 cm LVOT diam:     2.10 cm LV SV:         64 LV SV Index:   31 LVOT Area:     3.46 cm  LV Volumes (MOD) LV vol d, MOD A2C: 264.0 ml LV vol d, MOD A4C: 231.0 ml LV vol s, MOD A2C: 165.0 ml LV vol s, MOD A4C: 159.0 ml LV SV MOD A2C:     99.0 ml LV SV MOD A4C:     231.0 ml LV SV MOD BP:      91.0 ml RIGHT VENTRICLE TAPSE (M-mode): 1.3 cm LEFT ATRIUM              Index        RIGHT ATRIUM           Index LA diam:        5.30 cm  2.54 cm/m   RA Area:     15.30 cm LA Vol (A2C):   102.0 ml 48.90 ml/m  RA Volume:   36.40 ml  17.45 ml/m LA Vol (A4C):   115.0 ml 55.13 ml/m LA Biplane Vol: 110.0 ml 52.73 ml/m  AORTIC VALVE AV Area (Vmax):    1.36 cm AV Area (Vmean):   1.24 cm AV Area (VTI):     1.42 cm AV Vmax:           239.25 cm/s AV Vmean:          162.500 cm/s AV VTI:            0.452 m AV Peak Grad:      22.9 mmHg AV Mean Grad:      13.0 mmHg LVOT Vmax:         94.00 cm/s LVOT Vmean:        58.067 cm/s LVOT  VTI:          0.185 m LVOT/AV VTI ratio: 0.41 AI PHT:            564 msec  AORTA Ao Root diam: 4.70 cm Ao Asc diam:  4.60 cm TRICUSPID VALVE TR Peak grad:   21.5 mmHg TR Vmax:        232.00 cm/s  SHUNTS Systemic VTI:  0.19 m Systemic Diam: 2.10 cm Dani Gobble Croitoru MD Electronically signed by Sanda Klein MD Signature Date/Time: 10/19/2020/5:05:20 PM    Final     Patient Profile     MEDARD DECUIR is a 70 y.o. male with a past medical history significant for advanced AV block and AI with perivalvular leak.  he was admitted for planned procedures Temp perm pacer -> AI valvuloplasty -> perm CRT-D.   Assessment & Plan    2nd degree AV block /CHB S/p St Jude CRT-D 10/20/20 by Dr. Quentin Ore CXR pending Device interrogation stable.   2. AI s/p balloon valvuloplasty 10/18 Stable post op.     3. Chronic systolic CHF Echo 28/11/8865 LVEF 30-35% On GDMT with Delene Loll and Arlyce Harman.  Add low dose BB now s/p CRT  Usual follow up in place.  For questions or updates, please contact [place] Please consult [email or web address] for contact info under Cardiology/STEMI.  Signed, Shirley Friar, PA-C  10/21/2020, 8:26 AM

## 2020-10-21 NOTE — Progress Notes (Signed)
CARDIAC REHAB PHASE I   Offered to walk with pt. Pt hopeful for d/c soon. Reviewed site care and restrictions. Encouraged continued ambulation with emphasis on safety. Pt states he feels much better than prior to procedure. Declines CRP II at this time.   1010-1030 Rufina Falco, RN BSN 10/21/2020 10:27 AM

## 2020-10-21 NOTE — Care Management Important Message (Signed)
Important Message  Patient Details  Name: Gregory [name] MRN: [id number] Date of Birth: 1950/12/07   Medicare Important Message Given:  Yes     [name] 10/21/2020, 10:42 AM

## 2020-10-21 NOTE — Progress Notes (Signed)
Discharge instructions (including medications) discussed with and copy provided to patient/caregiver 

## 2020-10-25 ENCOUNTER — Telehealth: Payer: Self-pay | Admitting: Family Medicine

## 2020-10-25 NOTE — Chronic Care Management (AMB) (Signed)
  Chronic Care Management   Note  10/25/2020 Name: Gregory [name] MRN: [id number] DOB: 1950-07-08  Gregory [name] is a 70 y.o. year old male who is a primary care patient of Bedsole, Amy E, MD. I reached out to Gregory [name] by phone today in response to a referral sent by [name]'s PCP, Jinny Sanders, MD.   Mr. Ploeger was given information about Chronic Care Management services today including:  CCM service includes personalized support from designated clinical staff supervised by his physician, including individualized plan of care and coordination with other care providers 24/7 contact phone numbers for assistance for urgent and routine care needs. Service will only be billed when office clinical staff spend 20 minutes or more in a month to coordinate care. Only one practitioner may furnish and bill the service in a calendar month. The patient may stop CCM services at any time (effective at the end of the month) by phone call to the office staff.   Patient agreed to services and verbal consent obtained.   Follow up plan:   Tatjana Secretary/administrator

## 2020-10-28 ENCOUNTER — Telehealth: Payer: Self-pay

## 2020-10-28 NOTE — Telephone Encounter (Signed)
Transition Care Management Unsuccessful Follow-up Telephone Call  Date of discharge and from where:  10/21/20 from [facility]  Attempts:  1st Attempt  Reason for unsuccessful TCM follow-up call:  Left voice message   Thea Silversmith, RN, MSN, BSN, [place] Care Management Coordinator [phone]

## 2020-10-28 NOTE — Telephone Encounter (Signed)
Transition Care Management Follow-up Telephone Call Date of discharge and from where: 10/21/20 from [facility] How have you been since you were released from the hospital? "I am doing fine" Any questions or concerns? No  Items Reviewed: Did the pt receive and understand the discharge instructions provided? Yes  Medications obtained and verified? Yes  Other? No  Any new allergies since your discharge? No  Dietary orders reviewed? Yes Do you have support at home? Yes   Home Care and Equipment/Supplies: Were home health services ordered? no If so, what is the name of the agency? Not applicable  Has the agency set up a time to come to the patient's home? not applicable Were any new equipment or medical supplies ordered?  No What is the name of the medical supply agency? Not applicable Were you able to get the supplies/equipment? not applicable Do you have any questions related to the use of the equipment or supplies? No  Functional Questionnaire: (I = Independent and D = Dependent) ADLs:  I  Bathing/Dressing- I  Meal Prep- I  Eating- I  Maintaining continence- I  Transferring/Ambulation- I  Managing Meds- I  Follow up appointments reviewed:  PCP Hospital f/u appt confirmed? Yes  Scheduled to see Dr. Diona Browner on 12/20/20 @ 9 am. [place] [facility] f/u appt confirmed? Yes  Scheduled to see Angelena Form, PA-C @3 :30 pm/Echo @ 4:05 pm/Device check @ 2:40 pm on 11/02/2020. Another appointment with Angelena Form, PA-C on 11/16/20 at 4:00pm. Are transportation arrangements needed? No  If their condition worsens, is the pt aware to call PCP or go to the Emergency Dept.? Yes Was the patient provided with contact information for the PCP's office or ED? Yes Was to pt encouraged to call back with questions or concerns? Yes   Thea Silversmith, RN, MSN, BSN, [place] Care Management Coordinator [phone]

## 2020-11-01 ENCOUNTER — Telehealth: Payer: Self-pay

## 2020-11-01 ENCOUNTER — Other Ambulatory Visit: Payer: Self-pay

## 2020-11-01 ENCOUNTER — Ambulatory Visit (INDEPENDENT_AMBULATORY_CARE_PROVIDER_SITE_OTHER): Payer: Medicare HMO

## 2020-11-01 DIAGNOSIS — F418 Other specified anxiety disorders: Secondary | ICD-10-CM

## 2020-11-01 DIAGNOSIS — I10 Essential (primary) hypertension: Secondary | ICD-10-CM

## 2020-11-01 DIAGNOSIS — E785 Hyperlipidemia, unspecified: Secondary | ICD-10-CM

## 2020-11-01 NOTE — Progress Notes (Signed)
Chronic Care Management Pharmacy Assistant   Name: Gregory [name]  MRN: [id number] DOB: 07-13-1950  Reason for Encounter: CCM (Initial Questions)   Recent office visits:  09/15/2020 - Gregory Lofts, MD - Video Visit - Patient presented for iron deficiency anemia. Labs: CBC, IBC + Ferritin.  07/19/2020 - Gregory Lofts, MD - Patient presented for laceration of left lower extremity. Start: cephALEXin (KEFLEX) 500 MG capsule. 05/06/2020 - Gregory Lofts, MD - Patient presented for follow up for chronic gout of multiple sites. Referral to Vascular Surgery. No medication changes.   Recent consult visits:  10/06/2020 - Cardiology - Patient presented for perivalvular lead of prosthetic heart valve. Labs: BMP, CBC and Haptoglobin.  09/23/2020 - Gregory Breeding, MD - Patient Message - Patient stated that left leg is more swollen, shortness of breath and not sleeping. Advised to take additional 37m lasix (total of 432m for 2 days, elevate legs and avoid salt. Doctor  was able to look at his monitor and he has significant bradycardia and will need a pacemaker.  Doctor would like for him to have an elective admission today or tomorrow so that Doctor can check an echo, diurese him and get him set up for a pacemaker. 09/22/2020 - Hematology and Oncology - Patient presented for follow-up of heparin-induced thrombocytopenia. Labs: CBC, Erythropoietin, LDH, Multiple Myeloma Panel, Retic Panel, Direct antiglobulin test, Folate and Vitamin B12.  09/12/2020 - Cardiology - Patient presented for coronary artery disease involving native coronary artery of native heart without angina pectoris. Labs: EKG, Long term monitor (3-14 days). Start: furosemide (LASIX) 20 MG tablet. 07/27/2020 - Vascular Surgery - Patient presented for peripheral artery disease.  07/11/2020 - Orthopedic Surgery - Patient presented for unilateral primary osteoarthritis, left knee.  06/20/2020 - Orthopedic Surgery - Patient presented for bilateral  primary osteoarthritis of knee. Procedures: PR Drain/inject joint/bursa orthovisc inj.  06/13/2020 - Orthopedic Surgery - Patient presented for bilateral primary osteoarthritis of knee and unilateral primary osteoarthritis, right knee. Procedures: PR Drain/inject joint/bursa orthovisc inj.  06/06/2020 - Orthopedic Surgery - Patient presented for bilateral primary osteoarthritis of knee and unilateral primary osteoarthritis, right knee. Procedures: PR Drain/inject joint/bursa orthovisc inj.  06/01/2020 - Optometry - Patient presented for primary open-angle glaucoma, bilateral, severe stage.  05/16/2020 - Vascular Surgery - Patient presented for evaluation of abnormal ABIs. No medication changes.  05/09/2020 - Orthopedics - Patient presented for bilateral primary osteoarthritis of knee.  Hospital visits:  Medication Reconciliation was completed by comparing discharge summary, patient's EMR and Pharmacy list, and upon discussion with patient.  Admitted to the hospital on 10/17/2020 due to balloon aortic valve valvuloplasty and BIV pacemaker insertion CRT-P. Discharge date was 10/21/2020. [facility] -started metoprolol succinate (TOPROL-XL) 25 MG 24 hr tablet  Medication Changes at Hospital Discharge: -No medication changes.  Medications Discontinued at Hospital Discharge: -No discontinued medications.   Medications that remain the same after Hospital Discharge:??  -All other medications will remain the same.    Admitted to the hospital on 10/10/2020 due to Congestive Heart Failure. Discharge date was 10/11/2020. [facility] Aortic arch angiography was performed on 10/10/2020.  [facility] changes  Medication Changes at Hospital Discharge: No medication changes  Medications  Discontinued at Hospital Discharge: No medication changes  Medications that remain the same after Hospital Discharge:??  -All other medications will remain the  same.    Admitted to the hospital on 09/28/2020 due to Congestive Heart Failure. Discharge date was 10/02/2020. Discharged from [place]?Medications Started at [facility] Discharge:?? -started potassium chloride SA (KLOR-CON) 20 MEQ tablet -started sacubitril-valsartan (ENTRESTO) 49-51 MG -started spironolactone (ALDACTONE) 25 MG tablet  Medication Changes at Hospital Discharge: -Changed Mfurosemide (LASIX) 20 MG tablet-Take 2 tablets in the morning and 1 tablet in the afternoon  Medications Discontinued at Hospital Discharge: -Stopped Losartan 87m -Stopped Metoprolol Tartate 215m-Stopped Tramadol 5076mMedications that remain the same after Hospital Discharge:??  -All other medications will remain the same.    Medications: Outpatient Encounter Medications as of 11/01/2020  Medication Sig Note   acetaminophen (TYLENOL) 500 MG tablet Take 1,000 mg by mouth every 6 (six) hours as needed for moderate pain.    allopurinol (ZYLOPRIM) 100 MG tablet Take 1 tablet (100 mg total) by mouth daily.    ALPRAZolam (XANAX) 0.5 MG tablet TAKE 1 TABLET BY MOUTH ONCE DAILY AS NEEDED FOR ANXIETY TAKE  30  MINUTES  PRIOR  TO  PLANE  FLIGHT  OR  AS  NEEDED  FOR  ANXIETY    aspirin EC 81 MG tablet Take 1 tablet (81 mg total) by mouth daily. Swallow whole.    atorvastatin (LIPITOR) 80 MG tablet Take 1 tablet (80 mg total) by mouth daily. 10/10/2020: Not taking right now, was changed   colchicine 0.6 MG tablet Take 2 tablets by mouth once daily (Patient taking differently: Take 0.3 mg by mouth daily. Take 1 tablet (0.3 mg) by mouth scheduled every day, may take 1 additional tablet if needed for gout flare)    furosemide (LASIX) 20 MG tablet Take 2 tablets in the morning and 1 tablet in the afternoon    ketoconazole  (NIZORAL) 2 % shampoo Apply 1 application topically 2 (two) times a week. (Patient taking differently: Apply 1 application topically once a week.)    latanoprost (XALATAN) 0.005 % ophthalmic solution Place 1 drop into both eyes every morning.     metoprolol succinate (TOPROL-XL) 25 MG 24 hr tablet Take 0.5 tablets (12.5 mg total) by mouth daily.    Misc Natural Products (OSTEO BI-FLEX/5-LOXIN ADVANCED) TABS Take 2 tablets by mouth daily.    potassium chloride SA (KLOR-CON) 20 MEQ tablet Take 1 tablet (20 mEq total) by mouth daily.    sacubitril-valsartan (ENTRESTO) 49-51 MG Take 1 tablet by mouth 2 (two) times daily.    sertraline (ZOLOFT) 50 MG tablet Take 1 tablet (50 mg total) by mouth daily.    spironolactone (ALDACTONE) 25 MG tablet Take 1 tablet (25 mg total) by mouth daily.    traZODone (DESYREL) 50 MG tablet Take 0.5-1 tablets (25-50 mg total) by mouth at bedtime as needed for sleep. (Patient taking differently: Take 50 mg by mouth at bedtime as needed for sleep.)    No facility-administered encounter medications on file as of 11/01/2020.    Lab Results  Component Value Date/Time   HGBA1C 4.5 (L) 10/18/2020 03:32 AM   HGBA1C 4.5 (L) 09/09/2020 09:21 AM     BP Readings from Last 3 Encounters:  10/21/20 113/72  10/11/20 108/63  10/06/20 100/60    Star Rating Drugs:  Medication:    Last Fill: Day Supply Sacubitril-Valsartan 49-13m37m0/28/2022 30 Atorvastatin 80mg53m9/13/2022 90     Care Gaps: Annual wellness visit in last year? Yes Most Recent BP reading: 113/72 on 10/21/2020 MicheDebbora Dus notified  Marijean Niemann, [place] Pharmacy Assistant [phone]  Time Spent: 38 Minutes

## 2020-11-01 NOTE — Telephone Encounter (Signed)
Please mail blank Entresto PAP forms to patient. Thank you,  Debbora Dus, PharmD Clinical Pharmacist [facility] [phone]

## 2020-11-01 NOTE — Patient Instructions (Signed)
November 01, 2020  Dear Gregory [name],  It was a pleasure meeting you during our initial appointment on November 01, 2020. Below is a summary of the goals we discussed and components of chronic care management. Please contact me anytime with questions or concerns.   Visit Information  Patient Care Plan: CCM Pharmacy Care Plan     Problem Identified: CHL AMB "PATIENT-SPECIFIC PROBLEM"      Long-Range Goal: Disease Management   Start Date: 11/01/2020  Priority: High  Note:   Current Barriers:  Unable to independently afford treatment regimen Delene Loll cost   Pharmacist Clinical Goal(s):  Patient will contact provider office for questions/concerns as evidenced notation of same in electronic health record through collaboration with PharmD and provider.   Interventions: 1:1 collaboration with Jinny Sanders, MD regarding development and update of comprehensive plan of care as evidenced by provider attestation and co-signature Inter-disciplinary care team collaboration (see longitudinal plan of care) Comprehensive medication review performed; medication list updated in electronic medical record  Hypertension/CHF (BP goal <130/80) -Controlled - per clinic BP readings -Complicated by CHF per hospital admission - multiples new meds after hospitalization 9//28-10/02/20. No diagnosis in chart. -EF 40-45% (09/29/20). Pt aware of CHF diagnosis, denies concerns with new meds other than Entresto cost.  -Current treatment: Entresto 49-51 mg - 1 tablet twice daily  Metoprolol XL 25 mg - 1/2 tablet daily Spironolactone 25 mg - 1 tablet daily Potassium chloride SA 20 mEq - 1 tablet daily Lasix 20 mg - 2 tablets in morning and 1 in afternoon -Medications previously tried: none   -Current home readings: checked at home recently - 114/72, seems to be tolerating Entresto well  -Current dietary habits: low salt diet, discarded salt shaker, reads labels, increased veggies and fruit -Current exercise  habits: able to do daily activities without shortness of breath, his knees do limit his activity - he is planning to have knee replacement surgery -Denies hypotensive/hypertensive symptoms -Educated on Daily salt intake goal < 2300 mg; Importance of home blood pressure monitoring; -Counseled to monitor BP at home monthly, document, and provide log at future appointments -Recommended to continue current medication  Hyperlipidemia/CAD: (LDL goal < 70) -Controlled - LDL 63 -Current treatment: Atorvastatin 80 mg - 1 tablet daily  Aspirin 81 mg - 1 tablet daily -Medications previously tried: none reported  -Educated on Cholesterol goals;  -Recommended to continue current medication  Depression/Anxiety (Goal: Improve mood) -Controlled -Current treatment: Sertraline 50 mg - 1 tablet daily Trazodone 50 mg - 1/2-1 tablet PRN sleep (rare use - causes bad dreams) Xanax 0.5 mg - 1 tablet PRN daily (only takes for claustrophobia, rare anxiety, procedures) -Medications previously tried/failed: none reported -PHQ9: 0 (12/18/19) -GAD7: none completed -Educated on Benefits of medication for symptom control -Recommended to continue current medication  Gout (Goal: Prevent gout flares) -Controlled - no recent flares -Current treatment  Allopurinol 100 mg - 1 tablet daily Colchicine 0.6 mg - 1 tablet daily and additional 1 tablet PRN flare -Medications previously tried: none   -Recommended to continue current medication  Patient Goals/Self-Care Activities Patient will:  - collaborate with provider on medication access solutions  Follow Up Plan: Telephone follow up appointment with care management team member scheduled for:  2 weeks (follow up on Entresto cost concerns), 3 months CCM visit due to multiple recent hospitalizations       Mr. Piscopo was given information about Chronic Care Management services today including:  CCM service includes personalized support from designated  clinical  staff supervised by his physician, including individualized plan of care and coordination with other care providers 24/7 contact phone numbers for assistance for urgent and routine care needs. Standard insurance, coinsurance, copays and deductibles apply for chronic care management only during months in which we provide at least 20 minutes of these services. Most insurances cover these services at 100%, however patients may be responsible for any copay, coinsurance and/or deductible if applicable. This service may help you avoid the need for more expensive face-to-face services. Only one practitioner may furnish and bill the service in a calendar month. The patient may stop CCM services at any time (effective at the end of the month) by phone call to the office staff.  Patient agreed to services and verbal consent obtained.   Patient verbalizes understanding of instructions provided today and agrees to view in [place].   Debbora Dus, PharmD Clinical Pharmacist [facility] [phone]

## 2020-11-01 NOTE — Progress Notes (Addendum)
Encounter details: CCM Time Spent       Value Time User   Time spent with patient (minutes)  60 11/01/2020  3:16 PM Debbora Dus, [facility]   Time spent performing Chart review  30 11/01/2020  3:16 PM Debbora Dus, [facility]   Total time (minutes)  90 11/01/2020  3:16 PM Debbora Dus, RPH      Moderate to High Complex Decision Making       Value Time User   Moderate to High complex decision making  Yes 11/01/2020  3:15 PM Debbora Dus, [facility]      CCM Services: This encounter meets complex CCM services and moderate to high decision making.  Prior to outreach and patient consent for Chronic Care Management, I referred this patient for services after reviewing the nominated patient list or from a personal encounter with the patient.  I have personally reviewed this encounter including the documentation in this note and have collaborated with the care management provider regarding care management and care coordination activities to include development and update of the comprehensive care plan. I am certifying that I agree with the content of this note and encounter as supervising physician.  [name], MD [place] [facility] [place] Note  11/01/2020 Name:  Gregory [name] MRN:  [id number] DOB:  [birth year]  Summary: Initial CCM visit completed. Gregory [name] is unaffordable. He would like [place] to mail him PAP forms. Recently started new medications for CHF. He is tolerating these well. Home BP 117/72. CAD with LDL < 70 on statin. No adherence concerns.   Recommendations/Changes made from today's visit: None  Plan: Will mail Entresto PAP forms to patient  Follow up in 2 weeks for PAP update CCM visit 3 months    Subjective: Gregory [name] is an 70 y.o. year old male who is a primary patient of Bedsole, Amy E, MD.  The CCM team was consulted for assistance with disease management and care coordination needs.    Engaged with patient by telephone  for initial visit in response to provider referral for pharmacy case management and/or care coordination services.   Consent to Services:  The patient was given the following information about Chronic Care Management services today, agreed to services, and gave verbal consent: 1. CCM service includes personalized support from designated clinical staff supervised by the primary care provider, including individualized plan of care and coordination with other care providers 2. 24/7 contact phone numbers for assistance for urgent and routine care needs. 3. Service will only be billed when office clinical staff spend 20 minutes or more in a month to coordinate care. 4. Only one practitioner may furnish and bill the service in a calendar month. 5.The patient may stop CCM services at any time (effective at the end of the month) by phone call to the office staff. 6. The patient will be responsible for cost sharing (co-pay) of up to 20% of the service fee (after annual deductible is met). Patient agreed to services and consent obtained.  Patient Care Team: Jinny Sanders, MD as PCP - General (Family Medicine) [name], MD as PCP - Cardiology (Cardiology) Lendon Colonel, NP as Nurse Practitioner (Cardiology) Debbora Dus, [facility] as Pharmacist (Pharmacist)    Recent office visits:  09/15/2020 - [name], MD - Video Visit - Patient presented for iron deficiency anemia. Labs: CBC, IBC + Ferritin. Referral to GI and hematology. 07/19/2020 - [name], MD - Patient presented for laceration of  left lower extremity. Start: cephALEXin (KEFLEX) 500 MG capsule. 05/06/2020 - [name], MD - Patient presented for follow up for chronic gout of multiple sites. Referral to Vascular Surgery. No medication changes.    Recent consult visits:  10/06/2020 - Cardiology - Patient presented for perivalvular lead of prosthetic heart valve. Labs: BMP, CBC and Haptoglobin.  09/23/2020 - [name], MD -  Patient Message - Patient stated that left leg is more swollen, shortness of breath and not sleeping. Advised to take additional 49m lasix (total of 443m for 2 days, elevate legs and avoid salt. Doctor  was able to look at his monitor and he has significant bradycardia and will need a pacemaker.  Doctor would like for him to have an elective admission today or tomorrow so that Doctor can check an echo, diurese him and get him set up for a pacemaker. 09/22/2020 - Hematology and Oncology - Patient presented for follow-up of heparin-induced thrombocytopenia. Labs: CBC, Erythropoietin, LDH, Multiple Myeloma Panel, Retic Panel, Direct antiglobulin test, Folate and Vitamin B12.  09/12/2020 - Cardiology - Patient presented for coronary artery disease involving native coronary artery of native heart without angina pectoris. Labs: EKG, Long term monitor (3-14 days). Start: furosemide (LASIX) 20 MG tablet. 07/27/2020 - Vascular Surgery - Patient presented for peripheral artery disease.  07/11/2020 - Orthopedic Surgery - Patient presented for unilateral primary osteoarthritis, left knee.  06/01/2020 - Optometry - Patient presented for primary open-angle glaucoma, bilateral, severe stage.  05/16/2020 - Vascular Surgery - Patient presented for evaluation of abnormal ABIs. No medication changes.    Hospital visits:  Medication Reconciliation was completed by comparing discharge summary, patient's EMR and Pharmacy list, and upon discussion with patient.   Admitted to the hospital on 10/17/2020 due to balloon aortic valve valvuloplasty and BIV pacemaker insertion CRT-P. Discharge date was 10/21/2020. [facility] -started metoprolol succinate (TOPROL-XL) 25 MG 24 hr tablet   Admitted to the hospital on 10/10/2020 due to Congestive Heart Failure. Discharge date was 10/11/2020. [facility]  [facility]. Aortic arch angiography was performed on 10/10/2020. No medication changes  Admitted to the hospital on 09/28/2020 due to Congestive Heart Failure. Discharge date was 10/02/2020. [facility] -started potassium chloride SA (KLOR-CON) 20 MEQ tablet -started sacubitril-valsartan (ENTRESTO) 49-51 MG -started spironolactone (ALDACTONE) 25 MG tablet   Medication Changes at Hospital Discharge: -Changed furosemide (LASIX) 20 MG tablet-Take 2 tablets in the morning and 1 tablet in the afternoon   Medications Discontinued at Hospital Discharge: -Stopped Losartan 2522mStopped Metoprolol Tartate 53m20mtopped Tramadol 50mg60medications that remain the same after Hospital Discharge:??  -All other medications will remain the same.       Objective:  Lab Results  Component Value Date   CREATININE 0.91 10/21/2020   BUN 15 10/21/2020   GFR 86.77 03/21/2020   GFRNONAA >60 10/21/2020   GFRAA >60 03/17/2019   NA 138 10/21/2020   K 4.5 10/21/2020   CALCIUM 8.9 10/21/2020   CO2 27 10/21/2020   GLUCOSE 109 (H) 10/21/2020    Lab Results  Component Value Date/Time   HGBA1C 4.5 (L) 10/18/2020 03:32 AM   HGBA1C 4.5 (L) 09/09/2020 09:21 AM   GFR 86.77 03/21/2020 08:05 AM   GFR 86.04 12/18/2019 07:53 AM    Last diabetic Eye exam:  Lab Results  Component Value Date/Time   HMDIABEYEEXA No Retinopathy 10/16/2017 12:00 AM    Last diabetic Foot exam:  Lab Results  Component Value Date/Time   HMDIABFOOTEX done 01/09/2018 12:00 AM     Lab Results  Component Value Date   CHOL 151 03/21/2020   HDL 67.70 03/21/2020   LDLCALC 63 03/21/2020   LDLDIRECT 151.0 01/05/2016   TRIG 98.0 03/21/2020   CHOLHDL 2 03/21/2020    Hepatic Function Latest Ref Rng & Units 09/28/2020 09/09/2020 03/21/2020  Total Protein 6.5 - 8.1 g/dL 5.3(L) 6.4(L) 6.4  Albumin 3.5 - 5.0 g/dL 3.3(L) 4.2 4.7  AST 15  - 41 U/L 43(H) 51(H) 47(H)  ALT 0 - 44 U/L 24 38 20  Alk Phosphatase 38 - 126 U/L 41 43 43  Total Bilirubin 0.3 - 1.2 mg/dL 1.9(H) 1.9(H) 1.6(H)  Bilirubin, Direct 0.00 - 0.40 mg/dL - - -    Lab Results  Component Value Date/Time   TSH 1.620 09/28/2020 04:38 PM   TSH 2.560 08/14/2018 12:09 PM   TSH 4.14 01/11/2015 11:11 AM   FREET4 0.80 (L) 08/14/2018 12:09 PM    CBC Latest Ref Rng & Units 10/21/2020 10/20/2020 10/19/2020  WBC 4.0 - 10.5 K/uL 6.3 6.3 6.8  Hemoglobin 13.0 - 17.0 g/dL 8.5(L) 8.3(L) 8.6(L)  Hematocrit 39.0 - 52.0 % 28.0(L) 27.9(L) 28.3(L)  Platelets 150 - 400 K/uL 124(L) 144(L) 142(L)    Lab Results  Component Value Date/Time   VD25OH 41.22 03/21/2020 08:05 AM   VD25OH 39.30 12/18/2019 07:53 AM    Clinical ASCVD: Yes  The ASCVD Risk score (Arnett DK, et al., 2019) failed to calculate for the following reasons:   The patient has a prior MI or stroke diagnosis    Depression screen [facility] 2/9 12/18/2019 07/20/2019 05/26/2019  Decreased Interest 0 0 0  Down, Depressed, Hopeless 0 0 0  PHQ - 2 Score 0 0 0  Altered sleeping 0 - -  Tired, decreased energy 0 - -  Change in appetite 0 - -  Feeling bad or failure about yourself  0 - -  Trouble concentrating 0 - -  Moving slowly or fidgety/restless 0 - -  Suicidal thoughts 0 - -  PHQ-9 Score 0 - -  Difficult doing work/chores Not difficult at all - -  Some recent data might be hidden    Social History   Tobacco Use  Smoking Status Never  Smokeless Tobacco Never   BP Readings from Last 3 Encounters:  10/21/20 113/72  10/11/20 108/63  10/06/20 100/60   Pulse Readings from Last 3 Encounters:  10/21/20 71  10/11/20 69  10/06/20 76   Wt Readings from Last 3 Encounters:  10/21/20 215 lb 2.7 oz (97.6 kg)  10/10/20 218 lb 4.1 oz (99 kg)  10/06/20 217 lb 6.4 oz (98.6 kg)   BMI Readings from Last 3 Encounters:  10/21/20 33.70 kg/m  10/10/20 34.18 kg/m  10/06/20 34.05 kg/m    Assessment/Interventions:  Review of patient past medical history, allergies, medications, health status, including review of consultants reports, laboratory and other test data, was performed as part of comprehensive evaluation and provision of chronic care management services.   SDOH:  (Social Determinants of Health) assessments and interventions performed: Yes SDOH Interventions    Flowsheet Row Most Recent Value  SDOH Interventions   Financial Strain Interventions --  [Entresto PAP]      SDOH Screenings   Alcohol Screen: Low Risk    Last Alcohol Screening Score (AUDIT): 4  Depression (PHQ2-9): Low Risk    PHQ-2 Score: 0  Financial Resource Strain: Medium Risk   Difficulty of Paying Living Expenses: Somewhat hard  Food Insecurity: No Food Insecurity   Worried About Charity fundraiser in the Last Year: Never true   Ran Out of Food in the Last Year: Never true  Housing: Low Risk    Last Housing Risk Score: 0  Physical Activity: Inactive   Days of Exercise per Week: 0 days   Minutes of Exercise per Session: 0 min  Social Connections: Not on file  Stress: Stress Concern Present   Feeling of Stress : To some extent  Tobacco Use: Low Risk    Smoking Tobacco Use: Never   Smokeless Tobacco Use: Never   Passive Exposure: Not on file  Transportation Needs: No Transportation Needs   Lack of Transportation (Medical): No   Lack of Transportation (Non-Medical): No    CCM Care Plan  Allergies  Allergen Reactions   Heparin     HIT antibody and SRA positive    Medications Reviewed Today     Reviewed by Debbora Dus, [facility] (Pharmacist) on 11/01/20 at 1412  Med List Status: <None>   Medication Order Taking? Sig Documenting Provider Last Dose Status Informant  acetaminophen (TYLENOL) 500 MG tablet [id number] Yes Take 1,000 mg by mouth every 6 (six) hours as needed for moderate pain. [provider] Taking Active Self  allopurinol (ZYLOPRIM) 100 MG tablet [id number] Yes Take 1 tablet (100 mg total)  by mouth daily. Jinny Sanders, MD Taking Active Self  ALPRAZolam Duanne Moron) 0.5 MG tablet [id number] Yes TAKE 1 TABLET BY MOUTH ONCE DAILY AS NEEDED FOR ANXIETY TAKE  30  MINUTES  PRIOR  TO  PLANE  FLIGHT  OR  AS  NEEDED  FOR  ANXIETY Bedsole, Amy E, MD Taking Active Self  aspirin EC 81 MG tablet [id number] Yes Take 1 tablet (81 mg total) by mouth daily. Swallow whole. [name], MD Taking Active Self  atorvastatin (LIPITOR) 80 MG tablet [id number] Yes Take 1 tablet (80 mg total) by mouth daily. Jinny Sanders, MD Taking Active Self           Med Note Rebekah Chesterfield, DAWN S   Mon Oct 10, 2020 11:45 AM) Not taking right now, was changed  colchicine 0.6 MG tablet [id number] Yes Take 2 tablets by mouth once daily  Patient taking differently: Take 0.3 mg by mouth daily. Take 1 tablet (0.3 mg) by mouth scheduled every day, may take 1 additional tablet if needed for gout flare   Bedsole, Amy E, MD Taking Active   furosemide (LASIX) 20 MG tablet [id number] Yes Take 2 tablets in the morning and 1 tablet in the afternoon Almyra Deforest, [place] Taking Active Self  ketoconazole (NIZORAL) 2 % shampoo [id number] Yes Apply 1 application topically 2 (two) times a week.  Patient taking differently: Apply 1 application topically once a week.   Jinny Sanders, MD Taking Active   latanoprost (XALATAN) 0.005 % ophthalmic solution [id number] Yes Place 1 drop into both eyes every morning.  [provider] Taking Active Self  metoprolol succinate (TOPROL-XL) 25 MG 24 hr tablet [id number] Yes Take 0.5 tablets (12.5 mg total) by mouth daily. Shirley Friar, PA-C Taking Active   Misc Natural Products (OSTEO BI-FLEX/5-LOXIN ADVANCED) TABS [id number] Yes Take 2 tablets by mouth daily. [provider] Taking Active Self  potassium chloride SA (KLOR-CON) 20 MEQ tablet [id number] Yes Take 1 tablet (20 mEq  total) by mouth daily. Almyra Deforest, [place] Taking Active Self  sacubitril-valsartan (ENTRESTO) 49-51 MG [id number] Yes Take 1  tablet by mouth 2 (two) times daily. Almyra Deforest, [place] Taking Active Self  sertraline (ZOLOFT) 50 MG tablet [id number] Yes Take 1 tablet (50 mg total) by mouth daily. Jinny Sanders, MD Taking Active Self  spironolactone (ALDACTONE) 25 MG tablet [id number] Yes Take 1 tablet (25 mg total) by mouth daily. Almyra Deforest, [place] Taking Active Self  traZODone (DESYREL) 50 MG tablet [id number] Yes Take 0.5-1 tablets (25-50 mg total) by mouth at bedtime as needed for sleep.  Patient taking differently: Take 50 mg by mouth at bedtime as needed for sleep.   Jinny Sanders, MD Taking Active             Patient Active Problem List   Diagnosis Date Noted   ICD (implantable cardioverter-defibrillator) in place    S/p aortic valvuloplasty 10/18/2020   Complete heart block ([place]) 10/17/2020   Paravalvular leak (prosthetic valve) 10/10/2020   Severe aortic insufficiency 10/10/2020   Heart block AV complete ([place])    Iron deficiency anemia 09/22/2020   Laceration of left lower extremity 07/19/2020   Decreased pedal pulses 03/18/2020   Chronic gout of multiple sites 03/18/2020   Dilated cardiomyopathy ([place]) 03/13/2020   Dyslipidemia 03/13/2020   Coronary artery disease involving native coronary artery of native heart without angina pectoris 12/12/2019   Pain due to onychomycosis of toenails of both feet 04/23/2019   Coagulation disorder ([place]) 04/23/2019   Right foot pain 04/15/2019   S/P CABG x 3 04/15/2019   GAD (generalized anxiety disorder) 03/31/2019   Seborrheic dermatitis 03/31/2019   HIT (heparin-induced thrombocytopenia) 03/23/2019   Long term (current) use of anticoagulants 03/23/2019   Atrial fibrillation ([place]) 03/23/2019   S/P aortic valve replacement 03/06/2019   NSTEMI (non-ST elevated myocardial infarction) ([place]) 03/04/2019   Prediabetes 11/18/2018   Vitamin D deficiency 09/30/2018   Lymphedema 01/13/2018   Grade I diastolic dysfunction 54/65/0354   Situational anxiety 10/30/2016   Bilateral  primary osteoarthritis of knee 09/23/2015   Anemia 08/06/2013   Class 1 drug-induced obesity with serious comorbidity and body mass index (BMI) of 34.0 to 34.9 in adult 03/26/2011   Aortic insufficiency 02/15/2011   Essential hypertension 10/09/2006    Immunization History  Administered Date(s) Administered   Fluad Quad(high Dose 65+) 12/22/2019   Influenza,inj,Quad PF,6+ Mos 11/11/2018   Moderna Sars-Covid-2 Vaccination 01/27/2019, 02/24/2019, 10/31/2019   Pneumococcal Conjugate-13 01/11/2015   Pneumococcal Polysaccharide-23 04/17/2016   Tdap 02/25/2012    Conditions to be addressed/monitored:  Hypertension, Hyperlipidemia, Coronary Artery Disease, and Anxiety  Care Plan : [place]  Updates made by Debbora Dus, [place] since 11/01/2020 12:00 AM     Problem: CHL AMB "PATIENT-SPECIFIC PROBLEM"      Long-Range Goal: Disease Management   Start Date: 11/01/2020  Priority: High  Note:   Current Barriers:  Unable to independently afford treatment regimen Gregory [name] cost   Pharmacist Clinical Goal(s):  Patient will contact provider office for questions/concerns as evidenced notation of same in electronic health record through collaboration with PharmD and provider.   Interventions: 1:1 collaboration with Jinny Sanders, MD regarding development and update of comprehensive plan of care as evidenced by provider attestation and co-signature Inter-disciplinary care team collaboration (see longitudinal plan of care) Comprehensive medication review performed; medication list updated in electronic medical record  Hypertension/CHF (BP goal <130/80) -Controlled - per clinic BP readings -Complicated by CHF  per hospital admission - multiples new meds after hospitalization 9//28-10/02/20. No diagnosis in chart. -EF 40-45% (09/29/20). Pt aware of CHF diagnosis, denies concerns with new meds other than Entresto cost.  -Current treatment: Entresto 49-51 mg - 1 tablet twice daily   Metoprolol XL 25 mg - 1/2 tablet daily Spironolactone 25 mg - 1 tablet daily Potassium chloride SA 20 mEq - 1 tablet daily Lasix 20 mg - 2 tablets in morning and 1 in afternoon -Medications previously tried: none   -Current home readings: checked at home recently - 114/72, seems to be tolerating Entresto well  -Current dietary habits: low salt diet, discarded salt shaker, reads labels, increased veggies and fruit -Current exercise habits: able to do daily activities without shortness of breath, his knees do limit his activity - he is planning to have knee replacement surgery -Denies hypotensive/hypertensive symptoms -Educated on Daily salt intake goal < 2300 mg; Importance of home blood pressure monitoring; -Counseled to monitor BP at home monthly, document, and provide log at future appointments -Recommended to continue current medication  Hyperlipidemia/CAD: (LDL goal < 70) -Controlled - LDL 63 -Current treatment: Atorvastatin 80 mg - 1 tablet daily  Aspirin 81 mg - 1 tablet daily -Medications previously tried: none reported  -Educated on Cholesterol goals;  -Recommended to continue current medication  Depression/Anxiety (Goal: Improve mood) -Controlled -Current treatment: Sertraline 50 mg - 1 tablet daily Trazodone 50 mg - 1/2-1 tablet PRN sleep (rare use - causes bad dreams) Xanax 0.5 mg - 1 tablet PRN daily (only takes for claustrophobia, rare anxiety, procedures) -Medications previously tried/failed: none reported -PHQ9: 0 (12/18/19) -GAD7: none completed -Educated on Benefits of medication for symptom control -Recommended to continue current medication  Gout (Goal: Prevent gout flares) -Controlled - no recent flares -Current treatment  Allopurinol 100 mg - 1 tablet daily Colchicine 0.6 mg - 1 tablet daily and additional 1 tablet PRN flare -Medications previously tried: none   -Recommended to continue current medication  Patient Goals/Self-Care Activities Patient  will:  - collaborate with provider on medication access solutions  Follow Up Plan: Telephone follow up appointment with care management team member scheduled for:  2 weeks (follow up on Entresto cost concerns), 3 months CCM visit due to multiple recent hospitalizations       Medication Assistance:  Unable to afford Entresto at $47/month ; States cardiology office does not have any samples. He was given a coupon for first fill. He would like information mailed about Entresto PAP.   Compliance/Adherence/Medication fill history: Care Gaps: None  Star-Rating Drugs: Medication:                                        Last Fill:         Day Supply Sacubitril-Valsartan 49-75m             10/28/2020      30 Atorvastatin 811m                              09/13/2020      90  [facility]  [facility]: [phone]: [phone] pill box? Yes - fills based on list from discharge summary from 10/21/20 Pt endorses 100% compliance - very organized, taking medications routinely  We discussed: Benefits of medication synchronization, packaging and delivery as well as enhanced pharmacist oversight with [employer]. Patient decided to: Continue current medication management strategy  Care Plan and Follow Up Patient Decision:  Patient agrees to Care Plan and Follow-up.   Debbora Dus, PharmD Clinical Pharmacist [facility] at [facility] [phone]

## 2020-11-02 ENCOUNTER — Ambulatory Visit (HOSPITAL_COMMUNITY): Payer: Medicare HMO

## 2020-11-02 ENCOUNTER — Ambulatory Visit: Payer: Medicare HMO | Admitting: Physician Assistant

## 2020-11-02 ENCOUNTER — Encounter (HOSPITAL_COMMUNITY): Payer: Self-pay

## 2020-11-02 ENCOUNTER — Other Ambulatory Visit: Payer: Self-pay

## 2020-11-02 ENCOUNTER — Ambulatory Visit (INDEPENDENT_AMBULATORY_CARE_PROVIDER_SITE_OTHER): Payer: Medicare HMO

## 2020-11-02 VITALS — BP 90/60 | HR 78 | Ht 67.0 in | Wt 199.0 lb

## 2020-11-02 DIAGNOSIS — Z9889 Other specified postprocedural states: Secondary | ICD-10-CM

## 2020-11-02 DIAGNOSIS — I2510 Atherosclerotic heart disease of native coronary artery without angina pectoris: Secondary | ICD-10-CM

## 2020-11-02 DIAGNOSIS — I442 Atrioventricular block, complete: Secondary | ICD-10-CM

## 2020-11-02 DIAGNOSIS — I10 Essential (primary) hypertension: Secondary | ICD-10-CM | POA: Diagnosis not present

## 2020-11-02 DIAGNOSIS — I420 Dilated cardiomyopathy: Secondary | ICD-10-CM

## 2020-11-02 DIAGNOSIS — D599 Acquired hemolytic anemia, unspecified: Secondary | ICD-10-CM | POA: Diagnosis not present

## 2020-11-02 DIAGNOSIS — I5032 Chronic diastolic (congestive) heart failure: Secondary | ICD-10-CM

## 2020-11-02 DIAGNOSIS — Z950 Presence of cardiac pacemaker: Secondary | ICD-10-CM | POA: Diagnosis not present

## 2020-11-02 DIAGNOSIS — I509 Heart failure, unspecified: Secondary | ICD-10-CM | POA: Diagnosis not present

## 2020-11-02 LAB — CUP PACEART INCLINIC DEVICE CHECK
Brady Statistic RA Percent Paced: 37 %
Date Time Interrogation Session: 20221102154629
Implantable Lead Implant Date: 20221020
Implantable Lead Implant Date: 20221020
Implantable Lead Implant Date: 20221020
Implantable Lead Location: 753858
Implantable Lead Location: 753859
Implantable Lead Location: 753860
Implantable Pulse Generator Implant Date: 20221020
Lead Channel Pacing Threshold Amplitude: 0.625 V
Lead Channel Pacing Threshold Amplitude: 0.75 V
Lead Channel Pacing Threshold Amplitude: 1.625 V
Lead Channel Pacing Threshold Pulse Width: 0.5 ms
Lead Channel Pacing Threshold Pulse Width: 0.5 ms
Lead Channel Pacing Threshold Pulse Width: 0.7 ms
Lead Channel Sensing Intrinsic Amplitude: 12 mV
Lead Channel Sensing Intrinsic Amplitude: 2.3 mV
Lead Channel Setting Pacing Amplitude: 1.5 V
Lead Channel Setting Pacing Amplitude: 2 V
Lead Channel Setting Pacing Amplitude: 3.5 V
Lead Channel Setting Pacing Pulse Width: 0.5 ms
Lead Channel Setting Pacing Pulse Width: 0.7 ms
Pulse Gen Serial Number: 111052020

## 2020-11-02 NOTE — Progress Notes (Signed)
[facility] [place]                                     Cardiology Office Note:    Date:  11/02/2020   ID:  Gregory [name], DOB [birth year], MRN [id number]  PCP:  Gregory Sanders, MD  [facility] Cardiologist:  [name], MD / Dr. Burt Knack & Dr. Cyndia Bent (structural heart) [facility] Electrophysiologist:  None   Referring MD: Gregory Sanders, MD   [facility] s/p balloon valvuloplasty  History of Present Illness:    Gregory [name] is a 70 y.o. male with a hx of CAD and aortic valve disease s/p CABG/AVR 03/06/2019 with a 25 mm [name] Intuity rapid deployment valve and CABG x 3 with a left radial to PDA, LIMA-LAD, and pedicled RIMA-RPL by Dr. Julien Girt, TAA, post op HIT, HTN, obesity, chronic combined S/D CHF with recent admission for acute on chronic combined S/D CHF, HAVB, worsening anemia and found to have severe bioprosthetic valve dysfunction with severe perivalvular leaking and hemolytic anemia. He underwent successful balloon valvuloplasty on 10/18/20 and St Jude CRT-D on 10/20/20. Today he presents to clinic for follow up.  The patient underwent CABG/AVR 03/06/2019 with a 25 mm [name] Intuity rapid deployment valve and CABG x 3 with a left radial to PDA, LIMA-LAD, and pedicled RIMA - RPL. The patient's postoperative course was complicated by heparin-induced thrombocytopenia.  He was ultimately treated with several months of warfarin and recovered uneventfully.   He was in his usual state of health until the last several months when he developed progressive fatigue, exertional dyspnea, fatigue, palpitations as well as anemia.    He was admitted in 09/2020 for 2-1 heart block discovered on an outpatient monitor as well as symptoms of volume overload. During his admission he was diuresed with IV lasix and seen by electrophysiology. Repeat echo showed EF 40-45%, LV dilation, mild LVH, mild aortic root dilatation (4.27mm) and moderate ascending aorta  dilation (88mm) as well as severe paravalvular regurgitation in 2 different areas around the sewing ring of his bovine bioprosthesis. Gated cardiac CT done during this admission showed a 25 mm [name] Intuity bioprosthetic valve is present in the aortic position. The leaflets of the prosthesis are freely mobile without evidence of leaflet thickening or thrombosis. There are 2 areas of paravalvular leak under the RCC and LCC related. The leak under the RCC measures ~3.7 mm x ~8.9 mm. The leak under the [place] is ~3.8 mm x 9.1 mm. The leak under then [facility] is likely related to heavy annular calcifications under the [place]. 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 Sinotubular Junction: 41 mm (double oblique). Ascending Thoracic Aorta: 41 mm (double oblique). Severely dilated left ventricular cavity. CTA of the aorta and iliac vessels demonstrate what appear to be adequate pelvic vascular access to facilitate a transfemoral approach.     The patient was feeling better after diuresis and was ultimately discharge home. Permanent pacing was not recommended given possible need for redo surgery.    He was seen in the office by Dr. Burt Knack on 10/06/20. Dr. Burt Knack felt he needed to undergo further evaluation with a transesophageal echo study to further assess the mechanism of his paravalvular regurgitation.  It appears that there is a gap around both the left coronary cusp and the right coronary cusp of the aortic valve annulus  causing to separate leaks, cumulatively resulting in a large leak.  If we treat him percutaneously, we would consider placement of a temp/perm pacemaker lead followed by balloon valvuloplasty of the surgical sewing ring in order to better expand the surgical prosthesis and potentially seal the paravalvular leaks.  Placement of the temp/perm pacemaker would be considered because the likelihood of complete AV block and pacemaker dependence would be exceedingly high after this procedure.  It is  possible that following balloon valvuloplasty, the patient could need valve in valve TAVR if there is leaflet damage or central regurgitation.  It is also possible that he would need paravalvular leak closure if the balloon valvuloplasty procedure is unsuccessful.  After his valve procedure is completed, he would require permanent pacemaker placement. He also recommended a L/RCH to assess his coronary anatomy and bypass grafts as well as hemodynamics.    Most recent labs showed a creat of 1.23, GFR 62, K 5.2, Na 141, Hg 10.2, Haptoglobin <10 and LDH 939.    TEE showed EF 30-35%, mild RV enlargement/dysfunction, severe AI with severe periannular regurgitation seen at least 3 areas around right and left sinus. Poor stent skirt appostion below annulus. Also, appears to be limited motion of one of the leaflets. Elevated transgastric gradient mean 39 peak 86 mmHg likely reprsents combination of leaflet dysfunction and severe AR. Estimated AVA 1.64 cm2. Mild to moderate aortic valve stenosis. Of note the patient had bronchospasm and low sats throughout case.   L/RHC showed 3V native CAD with 3/3 patent bypass grafts with normal wedge and right heart pressures as well as normal cardiac output. Angiomax was used in substitution of heparin.  He underwent successful insertion of a medtronic active fixation temp-perm PM on 10/17 by Dr. Lovena Le. He then underwent successful BAV with a 26 mm True Balloon in order to expand his surgical prosthesis, with a reduction from severe PVL to mild residual PVL with mild central AI. POD1 echo showed EF 30-35%, mild RV dysfunction, residual perivalvular aortic insufficiency, improved from the previous study.Diastolic murmur resolved. He was continued on Asprin alone. He then underwent successful placement of a St Jude CRT-D by Dr. Quentin Ore on 10/20/20. Toprol XL added after device placement.   Today the patient presents to clinic for follow up. Here with his wife. He is doing great.  He can do things now that used to tire him out. No CP or SOB. No LE edema, orthopnea or PND. No dizziness or syncope. No blood in stool or urine. No palpitations.     Past Medical History:  Diagnosis Date   Alcohol abuse    Anxiety    Aortic valve disease    Arthritis    Coronary artery disease    Glaucoma    Hypertension    OSA (obstructive sleep apnea)    Pre-diabetes    S/P balloon aortic valvuloplasty 10/18/2020   done for bioprosthetic aortic valve replacement perivalvular leaking reducing it from severe to mild.   Shingles     Past Surgical History:  Procedure Laterality Date   AORTIC ARCH ANGIOGRAPHY N/A 10/10/2020   Procedure: AORTIC ARCH ANGIOGRAPHY;  Surgeon: Nelva Bush, MD;  Location: [place] [facility];  Service: Cardiovascular;  Laterality: N/A;   AORTIC VALVE REPLACEMENT N/A 03/06/2019   Procedure: AORTIC VALVE REPLACEMENT (AVR), USING INTUITY 25MM;  Surgeon: Wonda Olds, MD;  Location: [place];  Service: Open Heart Surgery;  Laterality: N/A;   BALLOON AORTIC VALVE VALVULOPLASTY N/A 10/18/2020   Procedure:  BALLOON AORTIC VALVE VALVULOPLASTY;  Surgeon: Sherren Mocha, MD;  Location: [place];  Service: Cardiovascular;  Laterality: N/A;   BIV PACEMAKER INSERTION CRT-P N/A 10/20/2020   Procedure: BIV PACEMAKER INSERTION CRT-P;  Surgeon: Vickie Epley, MD;  Location: [place] CV LAB;  Service: Cardiovascular;  Laterality: N/A;   CARDIAC CATHETERIZATION     CATARACT EXTRACTION     CORONARY ARTERY BYPASS GRAFT N/A 03/06/2019   Procedure: CORONARY ARTERY BYPASS GRAFTING (CABG), ON PUMP, TIMES THREE, USING BILATERAL INTERNAL MAMMARIES AND LEFT RADIAL ARTERY HARVEST;  Surgeon: Wonda Olds, MD;  Location: [place];  Service: Open Heart Surgery;  Laterality: N/A;  BILATERAL IMA   LEFT HEART CATH AND CORONARY ANGIOGRAPHY N/A 03/04/2019   Procedure: LEFT HEART CATH AND CORONARY ANGIOGRAPHY;  Surgeon: Burnell Blanks, MD;  Location: [place] CV LAB;  Service:  Cardiovascular;  Laterality: N/A;   None     RADIAL ARTERY HARVEST Left 03/06/2019   Procedure: RADIAL ARTERY HARVEST;  Surgeon: Wonda Olds, MD;  Location: [place];  Service: Open Heart Surgery;  Laterality: Left;   RIGHT HEART CATH AND CORONARY/GRAFT ANGIOGRAPHY N/A 10/10/2020   Procedure: RIGHT HEART CATH AND CORONARY/GRAFT ANGIOGRAPHY;  Surgeon: Nelva Bush, MD;  Location: [place] CV LAB;  Service: Cardiovascular;  Laterality: N/A;   TEE WITHOUT CARDIOVERSION N/A 03/06/2019   Procedure: TRANSESOPHAGEAL ECHOCARDIOGRAM (TEE);  Surgeon: Wonda Olds, MD;  Location: [place];  Service: Open Heart Surgery;  Laterality: N/A;   TEE WITHOUT CARDIOVERSION N/A 10/10/2020   Procedure: TRANSESOPHAGEAL ECHOCARDIOGRAM (TEE);  Surgeon: Josue Hector, MD;  [facility];  Service: Cardiovascular;  Laterality: N/A;   TEE WITHOUT CARDIOVERSION N/A 10/18/2020   Procedure: TRANSESOPHAGEAL ECHOCARDIOGRAM (TEE);  Surgeon: Sherren Mocha, MD;  Location: [place];  Service: Open Heart Surgery;  Laterality: N/A;   TEMPORARY PACEMAKER N/A 10/17/2020   Procedure: TEMPORARY PACEMAKER;  Surgeon: Evans Lance, MD;  Location: [place] CV LAB;  Service: Cardiovascular;  Laterality: N/A;    Current Medications: Current Meds  Medication Sig   acetaminophen (TYLENOL) 500 MG tablet Take 1,000 mg by mouth every 6 (six) hours as needed for moderate pain.   allopurinol (ZYLOPRIM) 100 MG tablet Take 1 tablet (100 mg total) by mouth daily.   ALPRAZolam (XANAX) 0.5 MG tablet TAKE 1 TABLET BY MOUTH ONCE DAILY AS NEEDED FOR ANXIETY TAKE  30  MINUTES  PRIOR  TO  PLANE  FLIGHT  OR  AS  NEEDED  FOR  ANXIETY   aspirin EC 81 MG tablet Take 1 tablet (81 mg total) by mouth daily. Swallow whole.   atorvastatin (LIPITOR) 80 MG tablet Take 1 tablet (80 mg total) by mouth daily.   colchicine 0.6 MG tablet Take 2 tablets by mouth once daily   furosemide (LASIX) 20 MG tablet Take 2 tablets in the morning and 1 tablet  in the afternoon   ketoconazole (NIZORAL) 2 % shampoo Apply 1 application topically 2 (two) times a week.   latanoprost (XALATAN) 0.005 % ophthalmic solution Place 1 drop into both eyes every morning.    metoprolol succinate (TOPROL-XL) 25 MG 24 hr tablet Take 0.5 tablets (12.5 mg total) by mouth daily.   Misc Natural Products (OSTEO BI-FLEX/5-LOXIN ADVANCED) TABS Take 2 tablets by mouth daily.   potassium chloride SA (KLOR-CON) 20 MEQ tablet Take 1 tablet (20 mEq total) by mouth daily.   sacubitril-valsartan (ENTRESTO) 49-51 MG Take 1 tablet by mouth 2 (two) times daily.  sertraline (ZOLOFT) 50 MG tablet Take 1 tablet (50 mg total) by mouth daily.   spironolactone (ALDACTONE) 25 MG tablet Take 1 tablet (25 mg total) by mouth daily.     Allergies:   Heparin   Social History   Socioeconomic History   Marital status: Married    Spouse name: Cindi   Number of children: 3   Years of education: Not on file   Highest education level: Not on file  Occupational History   Occupation: Architect  Tobacco Use   Smoking status: Never   Smokeless tobacco: Never  Vaping Use   Vaping Use: Never used  Substance and Sexual Activity   Alcohol use: Yes    Alcohol/week: 12.0 standard drinks    Types: 12 Cans of beer per week   Drug use: Yes    Frequency: 2.0 times per week    Types: Marijuana    Comment: gummies   Sexual activity: Not on file  Other Topics Concern   Not on file  Social History Narrative   Lives at home with wife.   Social Determinants of Health   Financial Resource Strain: Medium Risk   Difficulty of Paying Living Expenses: Somewhat hard  Food Insecurity: No Food Insecurity   Worried About Charity fundraiser in the Last Year: Never true   Ran Out of Food in the Last Year: Never true  Transportation Needs: No Transportation Needs   Lack of Transportation (Medical): No   Lack of Transportation (Non-Medical): No  Physical Activity: Inactive   Days of Exercise per  Week: 0 days   Minutes of Exercise per Session: 0 min  Stress: Stress Concern Present   Feeling of Stress : To some extent  Social Connections: Not on file     Family History: The patient's family history includes Alcohol abuse in his father; Breast cancer in his sister; Cancer in his mother. He was adopted.  ROS:   Please see the history of present illness.    All other systems reviewed and are negative.  EKGs/Labs/Other Studies Reviewed:    The following studies were reviewed today:      TAVR OPERATIVE NOTE     Date of Procedure:                10/18/2020   Preoperative Diagnosis:      Severe Aortic Stenosis    Postoperative Diagnosis:    Same    Procedure:        Transcatheter Balloon Aortic Valvulplasty - Percutaneous  Transfemoral Approach             Bard True balloon 26 mm              Co-Surgeons:                        Gaye Pollack, MD, Lenna Sciara, MD, and Sherren Mocha, MD   Anesthesiologist:                  Donnelly Angelica, MD   Echocardiographer:              Marry Guan, MD   Pre-operative Echo Findings: Severe paravalvular aortic insufficiency Moderate global LV systolic dysfunction   Post-operative Echo Findings: Mild residual paravalvular and central aortic insufficiency Unchanged left ventricular systolic function   ______________________     Echo 10/19/20: IMPRESSIONS   1. Left ventricular ejection fraction, by estimation, is 30 to 35%. The  left  ventricle has moderately decreased function. The left ventricle  demonstrates global hypokinesis. The left ventricular internal cavity size  was moderately dilated. There is  moderate eccentric left ventricular hypertrophy. Left ventricular  diastolic parameters are consistent with Grade I diastolic dysfunction  (impaired relaxation).   2. Right ventricular systolic function is mildly reduced. The right  ventricular size is normal. There is normal pulmonary artery systolic  pressure.   3.  Left atrial size was severely dilated.   4. Right atrial size was mildly dilated.   5. The mitral valve is normal in structure. Trivial mitral valve  regurgitation.   6. There is residual perivalvular aortic insufficiency, improved from the  previous study. There is mild perivalvular insufficiency in the vicinity  of the left coronary ostium. There is trivial leak towards the mitral  annulus. The aortic valve has been  repaired/replaced. Aortic valve regurgitation is mild. There is a 25 mm  [name] valve present in the aortic position. Procedure Date: 03/06/19.  Aortic regurgitation PHT measures 564 msec. Aortic valve mean gradient  measures 13.0 mmHg. Aortic valve Vmax  measures 2.39 m/s. Aortic valve acceleration time measures 85 msec.   7. Aortic dilatation noted. There is moderate dilatation of the aortic  root, measuring 47 mm. There is moderate dilatation of the ascending  aorta, measuring 46 mm.   8. The inferior vena cava is dilated in size with >50% respiratory  variability, suggesting right atrial pressure of 8 mmHg.   Comparison(s): Prior images reviewed side by side. There is marked  improvement in the severity of the aortic insufficiency and marked  reduction in the aortic prosthesis gradients.    ______________________   10/20/20 CONCLUSIONS:  1. Successful implantation of a St Jude CRT-D for ICM and CHB 2. Successful removal of a temporary transvenous pacemaker lead 3. No early apparent complications.    EKG:  EKG is NOT ordered today.  Recent Labs: 09/28/2020: ALT 24; B Natriuretic Peptide 2,498.6; TSH 1.620 10/20/2020: Magnesium 1.7 10/21/2020: BUN 15; Creatinine, Ser 0.91; Hemoglobin 8.5; Platelets 124; Potassium 4.5; Sodium 138  Recent Lipid Panel    Component Value Date/Time   CHOL 151 03/21/2020 0805   CHOL 132 08/07/2019 0811   TRIG 98.0 03/21/2020 0805   TRIG 162 (H) 10/25/2005 1507   HDL 67.70 03/21/2020 0805   HDL 61 08/07/2019 0811   CHOLHDL 2  03/21/2020 0805   VLDL 19.6 03/21/2020 0805   LDLCALC 63 03/21/2020 0805   LDLCALC 52 08/07/2019 0811   LDLDIRECT 151.0 01/05/2016 0748     Risk Assessment/Calculations:       Physical Exam:    VS:  BP 90/60 (BP Location: Left Arm, Patient Position: Sitting, Cuff Size: Normal)   Pulse 78   Ht 5\' 7"  (1.702 m)   Wt 199 lb (90.3 kg)   SpO2 98%   BMI 31.17 kg/m     Wt Readings from Last 3 Encounters:  11/02/20 199 lb (90.3 kg)  10/21/20 215 lb 2.7 oz (97.6 kg)  10/10/20 218 lb 4.1 oz (99 kg)     GEN:  Well nourished, well developed in no acute distress HEENT: Normal NECK: No JVD LYMPHATICS: No lymphadenopathy CARDIAC: RRR, no murmurs, rubs, gallops RESPIRATORY:  Clear to auscultation without rales, wheezing or rhonchi  ABDOMEN: Soft, non-tender, non-distended MUSCULOSKELETAL:  No edema; No deformity  SKIN: Warm and dry NEUROLOGIC:  Alert and oriented x 3 PSYCHIATRIC:  Normal affect   ASSESSMENT:    1. Status post balloon aortic  valvuloplasty   2. S/P placement of cardiac pacemaker   3. Acquired hemolytic anemia (HCC)   4. Chronic diastolic CHF (congestive heart failure) ([place])   5. Coronary artery disease involving native coronary artery of native heart without angina pectoris   6. Essential hypertension    PLAN:    In order of problems listed above:  Severe bioprosthetic valve dysfunction with severe perivalvular leaking s/p BAV: s/p successful BAV with a 26 mm True Balloon in order to expand his surgical prosthesis, with a reduction from severe PVL to mild residual PVL with mild central AI. POD1 echo showed EF 30-35%, mild RV dysfunction, residual perivalvular aortic insufficiency, improved from the previous study. No diastolic murmur. Continue on Asprin alone. Doing great today with a big improvement in symptoms. Understands the need for ongoing SBE prophlaxis. Will see him back in mid November for 1 month follow up and repeat echo.    S/p PPM: s/p St Jude CRT-D  by Dr. Quentin Ore on 10/20/20. Had wound check today.    Anemia: felt to mostly likely be related to hemolysis with Haptoglobin <10 and LDH 939. Hg is 8.5 at discharge. Check Hg today.    Chronic combined S/D CHF: appears euvolemic. EF 30-35%. Continue Entresto 49-51 mg BID, spiro 25mg  daily, lasix 40mg  AM/31m PM and Toprol XL 12.5mg  daily. Check BMET today    CAD s/p CABG: recent cath with 3/3 patent bypass grafts. Continue medical therapy.    HTN: BP well controlled in the context of CHF medications. 90/60 today but asymptomatic. He says it runs a bit higher at home.    Medication Adjustments/Labs and Tests Ordered: Current medicines are reviewed at length with the patient today.  Concerns regarding medicines are outlined above.  Orders Placed This Encounter  Procedures   Basic metabolic panel   CBC    No orders of the defined types were placed in this encounter.   Patient Instructions  Medication Instructions:  Your physician recommends that you continue on your current medications as directed. Please refer to the Current Medication list given to you today.  *If you need a refill on your cardiac medications before your next appointment, please call your pharmacy*   Lab Work: Bmp, Cbc- Today   If you have labs (blood work) drawn today and your tests are completely normal, you will receive your results only by: [place] (if you have MyChart) OR A paper copy in the mail If you have any lab test that is abnormal or we need to change your treatment, we will call you to review the results.   Testing/Procedures: None ordered    Follow-Up: Follow up as scheduled    Other Instructions None     Signed, Angelena Form, PA-C  11/02/2020 7:32 PM    [facility]

## 2020-11-02 NOTE — Patient Instructions (Signed)
   After Your ICD (Implantable Cardiac Defibrillator)    Monitor your defibrillator site for redness, swelling, and drainage. Call the device clinic at [phone] if you experience these symptoms or fever/chills.  Your incision was closed with Steri-strips or staples:  You may shower 7 days after your procedure and wash your incision with soap and water. Avoid lotions, ointments, or perfumes over your incision until it is well-healed.    You may use a hot tub or a pool after your wound check appointment if the incision is completely closed.  Do not lift, push or pull greater than 10 pounds with the affected arm until 6 weeks after your procedure. There are no other restrictions in arm movement after your wound check appointment.  Your ICD is MRI compatible.  Your ICD is designed to protect you from life threatening heart rhythms. Because of this, you may receive a shock.   1 shock with no symptoms:  Call the office during business hours. 1 shock with symptoms (chest pain, chest pressure, dizziness, lightheadedness, shortness of breath, overall feeling unwell):  Call 911. If you experience 2 or more shocks in 24 hours:  Call 911. If you receive a shock, you should not drive.  [place] DMV - no driving for 6 months if you receive appropriate therapy from your ICD.   ICD Alerts:  Some alerts are vibratory and others beep. These are NOT emergencies. Please call our office to let us know. If this occurs at night or on weekends, it can wait until the next business day. Send a remote transmission.  If your device is capable of reading fluid status (for heart failure), you will be offered monthly monitoring to review this with you.   Remote monitoring is used to monitor your ICD from home. This monitoring is scheduled every 91 days by our office. It allows us to keep an eye on the functioning of your device to ensure it is working properly. You will routinely see your Electrophysiologist annually  (more often if necessary).   

## 2020-11-02 NOTE — Progress Notes (Signed)
Forms for Entresto PAP mailed to the patient.  Debbora Dus, CPP notified  Avel Sensor, [place] Assistant [phone]   CPA   10 mins

## 2020-11-02 NOTE — Progress Notes (Signed)
Wound check appointment s/p CRT-D implant 10/20/20. Steri-strips removed. Wound without redness or edema. Incision edges approximated, wound well healed. Normal device function. Thresholds, sensing, and impedances consistent with implant measurements. Device programmed at 3.5V in the A/Auto in the RV/LV for extra safety margin until 3 month visit. Histogram distribution appropriate for patient and level of activity. No mode switches or ventricular arrhythmias noted. Bi-V pacing >99%. Patient educated about wound care, arm mobility, lifting restrictions, shock plan. Patient enrolled in remote monitoring with next transmission scheduled 01/25/21. 91 day post implant follow up with Dr. Quentin Ore 01/25/21.

## 2020-11-02 NOTE — Patient Instructions (Signed)
Medication Instructions:  Your physician recommends that you continue on your current medications as directed. Please refer to the Current Medication list given to you today.  *If you need a refill on your cardiac medications before your next appointment, please call your pharmacy*   Lab Work: Bmp, Cbc- Today   If you have labs (blood work) drawn today and your tests are completely normal, you will receive your results only by: [place] (if you have MyChart) OR A paper copy in the mail If you have any lab test that is abnormal or we need to change your treatment, we will call you to review the results.   Testing/Procedures: None ordered    Follow-Up: Follow up as scheduled    Other Instructions None

## 2020-11-03 LAB — CBC
Hematocrit: 29.4 % — ABNORMAL LOW (ref 37.5–51.0)
Hemoglobin: 9.8 g/dL — ABNORMAL LOW (ref 13.0–17.7)
MCH: 30.7 pg (ref 26.6–33.0)
MCHC: 33.3 g/dL (ref 31.5–35.7)
MCV: 92 fL (ref 79–97)
Platelets: 187 10*3/uL (ref 150–450)
RBC: 3.19 x10E6/uL — ABNORMAL LOW (ref 4.14–5.80)
RDW: 18 % — ABNORMAL HIGH (ref 11.6–15.4)
WBC: 7.7 10*3/uL (ref 3.4–10.8)

## 2020-11-03 LAB — BASIC METABOLIC PANEL
BUN/Creatinine Ratio: 18 (ref 10–24)
BUN: 23 mg/dL (ref 8–27)
CO2: 22 mmol/L (ref 20–29)
Calcium: 9.5 mg/dL (ref 8.6–10.2)
Chloride: 103 mmol/L (ref 96–106)
Creatinine, Ser: 1.25 mg/dL (ref 0.76–1.27)
Glucose: 94 mg/dL (ref 70–99)
Potassium: 5.2 mmol/L (ref 3.5–5.2)
Sodium: 141 mmol/L (ref 134–144)
eGFR: 61 mL/min/{1.73_m2} (ref >59)

## 2020-11-07 ENCOUNTER — Telehealth: Payer: Self-pay

## 2020-11-07 NOTE — Telephone Encounter (Signed)
Spoke with patient informed him of alert received from his home monitor regarding high LV output patient agreeable to appointment on 11/15/20 at 2:30 to have device checked.

## 2020-11-08 ENCOUNTER — Telehealth: Payer: Self-pay | Admitting: Family Medicine

## 2020-11-08 NOTE — Telephone Encounter (Signed)
Cardiology sent in prescription on 10/02/2020 for #90 with 2 refills.  He should have refills available at [facility].  [name] notified of this via telephone.

## 2020-11-08 NOTE — Telephone Encounter (Signed)
1.Medication Requested: furosemide (LASIX) 20 MG tablet 2. Pharmacy (Name, Street, [place]):[facility], [place]  3. On Med List: yes; pt states his dosage has changed after he went to the hospital and they changed his pills to 2 in the AM/1 PM  4. Last Visit with PCP:   5. Next visit date with PCP:   Agent: Please be advised that RX refills may take up to 3 business days. We ask that you follow-up with your pharmacy.

## 2020-11-08 NOTE — Telephone Encounter (Signed)
Pt wife returning call. Pt wife states that the pharmacy states that its to early to get refills,but pt wife states that it  should be able to get refill on 11/02/20. Pt would like for you to call pharmacy.

## 2020-11-08 NOTE — Telephone Encounter (Signed)
Spoke with Pharmacist at [facility].  Refill was called in on old prescription.  They will deactivate that Rx and fill the one sent in by cardiology for 2 tabs in a AM and 1 tab in the PM.  Gregory [name] notified by telephone that [facility] is getting new Rx ready for them to pick up.

## 2020-11-11 ENCOUNTER — Ambulatory Visit
Admission: RE | Admit: 2020-11-11 | Discharge: 2020-11-11 | Disposition: A | Discharge status: Home / Self Care | Payer: Medicare HMO | Source: Ambulatory Visit | Attending: Cardiology | Admitting: Cardiology

## 2020-11-11 ENCOUNTER — Telehealth: Payer: Self-pay

## 2020-11-11 ENCOUNTER — Encounter: Payer: Self-pay | Admitting: Hematology and Oncology

## 2020-11-11 DIAGNOSIS — I517 Cardiomegaly: Secondary | ICD-10-CM | POA: Diagnosis not present

## 2020-11-11 DIAGNOSIS — Z950 Presence of cardiac pacemaker: Secondary | ICD-10-CM

## 2020-11-11 DIAGNOSIS — Z952 Presence of prosthetic heart valve: Secondary | ICD-10-CM | POA: Diagnosis not present

## 2020-11-11 DIAGNOSIS — I700 Atherosclerosis of aorta: Secondary | ICD-10-CM | POA: Diagnosis not present

## 2020-11-11 DIAGNOSIS — Z043 Encounter for examination and observation following other accident: Secondary | ICD-10-CM | POA: Diagnosis not present

## 2020-11-11 NOTE — Telephone Encounter (Signed)
The patient's wife reports Gregory [name] was walking the dog last night. The dog pulled and Ned fell flat on his face and chest.  He reports no acute symptoms but does have some chest discomfort from the fall.  Discussed with Kathyrn Drown. Since the patient denies SOB, feeling like he will faint, etc, he will go to [place] for a CXR ASAP to make sure PPM is OK. They were grateful for call and agree with plan.

## 2020-11-14 ENCOUNTER — Ambulatory Visit (HOSPITAL_COMMUNITY): Payer: Medicare HMO | Attending: Internal Medicine

## 2020-11-14 ENCOUNTER — Other Ambulatory Visit: Payer: Self-pay

## 2020-11-14 DIAGNOSIS — I351 Nonrheumatic aortic (valve) insufficiency: Secondary | ICD-10-CM | POA: Diagnosis not present

## 2020-11-14 LAB — ECHOCARDIOGRAM COMPLETE
AV Mean grad: 10 mmHg
AV Peak grad: 18 mmHg
Ao pk vel: 2.12 m/s
Area-P 1/2: 3.12 cm2
P 1/2 time: 325 msec
S' Lateral: 4.2 cm

## 2020-11-15 ENCOUNTER — Ambulatory Visit (INDEPENDENT_AMBULATORY_CARE_PROVIDER_SITE_OTHER): Payer: Medicare HMO

## 2020-11-15 ENCOUNTER — Ambulatory Visit: Payer: Medicare HMO | Admitting: Cardiovascular Disease

## 2020-11-15 ENCOUNTER — Encounter: Payer: Self-pay | Admitting: Cardiovascular Disease

## 2020-11-15 VITALS — BP 92/58 | HR 66 | Ht 67.0 in | Wt 210.0 lb

## 2020-11-15 DIAGNOSIS — I351 Nonrheumatic aortic (valve) insufficiency: Secondary | ICD-10-CM | POA: Diagnosis not present

## 2020-11-15 DIAGNOSIS — I420 Dilated cardiomyopathy: Secondary | ICD-10-CM

## 2020-11-15 DIAGNOSIS — I5042 Chronic combined systolic (congestive) and diastolic (congestive) heart failure: Secondary | ICD-10-CM | POA: Diagnosis not present

## 2020-11-15 LAB — CUP PACEART INCLINIC DEVICE CHECK
Battery Remaining Longevity: 62 mo
Brady Statistic RA Percent Paced: 38 %
Brady Statistic RV Percent Paced: 0 %
Date Time Interrogation Session: 20221115153500
HighPow Impedance: 58.5 Ohm
Implantable Lead Implant Date: 20221020
Implantable Lead Implant Date: 20221020
Implantable Lead Implant Date: 20221020
Implantable Lead Location: 753858
Implantable Lead Location: 753859
Implantable Lead Location: 753860
Implantable Pulse Generator Implant Date: 20221020
Lead Channel Impedance Value: 425 Ohm
Lead Channel Impedance Value: 425 Ohm
Lead Channel Impedance Value: 712.5 Ohm
Lead Channel Pacing Threshold Amplitude: 0.5 V
Lead Channel Pacing Threshold Amplitude: 0.75 V
Lead Channel Pacing Threshold Amplitude: 3 V
Lead Channel Pacing Threshold Pulse Width: 0.5 ms
Lead Channel Pacing Threshold Pulse Width: 0.5 ms
Lead Channel Pacing Threshold Pulse Width: 0.7 ms
Lead Channel Sensing Intrinsic Amplitude: 12 mV
Lead Channel Sensing Intrinsic Amplitude: 2.1 mV
Lead Channel Setting Pacing Amplitude: 1.5 V
Lead Channel Setting Pacing Amplitude: 3.125
Lead Channel Setting Pacing Amplitude: 3.5 V
Lead Channel Setting Pacing Pulse Width: 0.5 ms
Lead Channel Setting Pacing Pulse Width: 0.7 ms
Lead Channel Setting Sensing Sensitivity: 0.5 mV
Pulse Gen Serial Number: 111052020

## 2020-11-15 NOTE — Patient Instructions (Signed)
Medication Instructions:  Your physician recommends that you continue on your current medications as directed. Please refer to the Current Medication list given to you today.  *If you need a refill on your cardiac medications before your next appointment, please call your pharmacy*   Lab Work: None ordered   If you have labs (blood work) drawn today and your tests are completely normal, you will receive your results only by: [place] (if you have MyChart) OR A paper copy in the mail If you have any lab test that is abnormal or we need to change your treatment, we will call you to review the results.   Testing/Procedures: Your physician has requested that you have an echocardiogram in 6 months . Echocardiography is a painless test that uses sound waves to create images of your heart. It provides your doctor with information about the size and shape of your heart and how well your heart's chambers and valves are working. This procedure takes approximately one hour. There are no restrictions for this procedure.    Follow-Up: At [facility], you and your health needs are our priority.  As part of our continuing mission to provide you with exceptional heart care, we have created designated Provider Care Teams.  These Care Teams include your primary Cardiologist (physician) and Advanced Practice Providers (APPs -  Physician Assistants and Nurse Practitioners) who all work together to provide you with the care you need, when you need it.  We recommend signing up for the patient portal called "MyChart".  Sign up information is provided on this After Visit Summary.  MyChart is used to connect with patients for Virtual Visits (Telemedicine).  Patients are able to view lab/test results, encounter notes, upcoming appointments, etc.  Non-urgent messages can be sent to your provider as well.   To learn more about what you can do with MyChart, go to [email or web address].    Your next  appointment:   6 month(s)  The format for your next appointment:   In Person  Provider:   Sherren Mocha, MD or Nell Range, PA-C{    Other Instructions None

## 2020-11-15 NOTE — Progress Notes (Signed)
CRT-D device check in office to run LV thresholds for high output mode. LV threshold manually ran today at 3.0V/0.38ms. Discussed with Dr. Curt Bears. Patient had CXR for mechanical fall  11/11/20, after high output mode. Can utilize for lead placement confirmation. RA/RV Lead impedance trends stable over time. 1 eight second AMS noted. No ventricular arrhythmia episodes recorded. Patient bi-ventricularly pacing 99% of the time. Device programmed with appropriate safety margins. Heart failure diagnostics reviewed and trends are stable for patient. No changes made this session. Estimated longevity 5 years 2 months.  Patient enrolled in remote follow up. Patient will return for 91 day post implant follow up with Dr. Quentin Ore 01/25/21. Patient education completed including shock plan.

## 2020-11-15 NOTE — Progress Notes (Signed)
Cardiology Office Note:    Date:  11/15/2020   ID:  Gregory [name], DOB Feb 17, 1950, MRN [id number]  PCP:  Jinny Sanders, MD   [facility] Providers Cardiologist:  [name], MD Cardiology APP:  Lendon Colonel, NP     Referring MD: Jinny Sanders, MD   Chief Complaint  Patient presents with   Aortic Insuffiency    History of Present Illness:    Gregory [name] is a 70 y.o. male presenting for follow-up of aortic valve disease.  He has a complicated history with a aortic stenosis and multivessel coronary artery disease.  He underwent multivessel CABG and aortic valve replacement in March 2021 with a 25 mm [name] Intuity rapid deployment valve.  Other problems include thoracic aortic aneurysm, postoperative heparin-induced thrombocytopenia, hypertension, obesity, and chronic combined systolic and diastolic heart failure.  The patient was recently hospitalized with high-grade AV block and acute on chronic heart failure.  He was found to have severe paravalvular regurgitation and after extensive multidisciplinary heart team review, we elected to proceed with balloon valvuloplasty of his biological aortic valve prosthesis and pacemaker placement for high-grade conduction disease.  The patient was treated initially with a temp/permanent pacemaker lead in order to stabilize his heart rhythm perioperatively.  We then proceeded with balloon aortic valvuloplasty using a 26 mm Bard true balloon in order to expand the patient's surgical bioprosthesis and reduce his paravalvular regurgitation.  Following the procedure, the patient had mild residual paravalvular regurgitation with marked improvement from baseline.  He then underwent permanent pacemaker placement with a CRT-D device prior to discharge.  The patient is here with his wife today for follow-up evaluation.  He is doing very well at present.  He is back to work.  He denies chest pain, shortness of breath, fatigue,  lightheadedness, heart palpitations, or syncope.  Past Medical History:  Diagnosis Date   Alcohol abuse    Anxiety    Aortic valve disease    Arthritis    Coronary artery disease    Glaucoma    Hypertension    OSA (obstructive sleep apnea)    Pre-diabetes    S/P balloon aortic valvuloplasty 10/18/2020   done for bioprosthetic aortic valve replacement perivalvular leaking reducing it from severe to mild.   Shingles     Past Surgical History:  Procedure Laterality Date   AORTIC ARCH ANGIOGRAPHY N/A 10/10/2020   Procedure: AORTIC ARCH ANGIOGRAPHY;  Surgeon: Nelva Bush, MD;  Location: [place] [facility];  Service: Cardiovascular;  Laterality: N/A;   AORTIC VALVE REPLACEMENT N/A 03/06/2019   Procedure: AORTIC VALVE REPLACEMENT (AVR), USING INTUITY 25MM;  Surgeon: Wonda Olds, MD;  Location: [place];  Service: Open Heart Surgery;  Laterality: N/A;   BALLOON AORTIC VALVE VALVULOPLASTY N/A 10/18/2020   Procedure: BALLOON AORTIC VALVE VALVULOPLASTY;  Surgeon: Sherren Mocha, MD;  Location: [place];  Service: Cardiovascular;  Laterality: N/A;   BIV PACEMAKER INSERTION CRT-P N/A 10/20/2020   Procedure: BIV PACEMAKER INSERTION CRT-P;  Surgeon: Vickie Epley, MD;  Location: [place] [facility];  Service: Cardiovascular;  Laterality: N/A;   CARDIAC CATHETERIZATION     CATARACT EXTRACTION     CORONARY ARTERY BYPASS GRAFT N/A 03/06/2019   Procedure: CORONARY ARTERY BYPASS GRAFTING (CABG), ON PUMP, TIMES THREE, USING BILATERAL INTERNAL MAMMARIES AND LEFT RADIAL ARTERY HARVEST;  Surgeon: Wonda Olds, MD;  Location: [place];  Service: Open Heart Surgery;  Laterality: N/A;  BILATERAL IMA   LEFT HEART CATH  AND CORONARY ANGIOGRAPHY N/A 03/04/2019   Procedure: LEFT HEART CATH AND CORONARY ANGIOGRAPHY;  Surgeon: Burnell Blanks, MD;  Location: [place] [facility];  Service: Cardiovascular;  Laterality: N/A;   None     RADIAL ARTERY HARVEST Left 03/06/2019   Procedure: RADIAL ARTERY  HARVEST;  Surgeon: Wonda Olds, MD;  Location: [place];  Service: Open Heart Surgery;  Laterality: Left;   RIGHT HEART CATH AND CORONARY/GRAFT ANGIOGRAPHY N/A 10/10/2020   Procedure: RIGHT HEART CATH AND CORONARY/GRAFT ANGIOGRAPHY;  Surgeon: Nelva Bush, MD;  Location: [place] [facility];  Service: Cardiovascular;  Laterality: N/A;   TEE WITHOUT CARDIOVERSION N/A 03/06/2019   Procedure: TRANSESOPHAGEAL ECHOCARDIOGRAM (TEE);  Surgeon: Wonda Olds, MD;  Location: [place];  Service: Open Heart Surgery;  Laterality: N/A;   TEE WITHOUT CARDIOVERSION N/A 10/10/2020   Procedure: TRANSESOPHAGEAL ECHOCARDIOGRAM (TEE);  Surgeon: Josue Hector, MD;  Location: [facility];  Service: Cardiovascular;  Laterality: N/A;   TEE WITHOUT CARDIOVERSION N/A 10/18/2020   Procedure: TRANSESOPHAGEAL ECHOCARDIOGRAM (TEE);  Surgeon: Sherren Mocha, MD;  Location: [place];  Service: Open Heart Surgery;  Laterality: N/A;   TEMPORARY PACEMAKER N/A 10/17/2020   Procedure: TEMPORARY PACEMAKER;  Surgeon: Evans Lance, MD;  Location: [place] [facility];  Service: Cardiovascular;  Laterality: N/A;    Current Medications: Current Meds  Medication Sig   acetaminophen (TYLENOL) 500 MG tablet Take 1,000 mg by mouth every 6 (six) hours as needed for moderate pain.   allopurinol (ZYLOPRIM) 100 MG tablet Take 1 tablet (100 mg total) by mouth daily.   ALPRAZolam (XANAX) 0.5 MG tablet TAKE 1 TABLET BY MOUTH ONCE DAILY AS NEEDED FOR ANXIETY TAKE  30  MINUTES  PRIOR  TO  PLANE  FLIGHT  OR  AS  NEEDED  FOR  ANXIETY   aspirin EC 81 MG tablet Take 1 tablet (81 mg total) by mouth daily. Swallow whole.   atorvastatin (LIPITOR) 80 MG tablet Take 1 tablet (80 mg total) by mouth daily.   colchicine 0.6 MG tablet Take 2 tablets by mouth once daily   furosemide (LASIX) 20 MG tablet Take 2 tablets in the morning and 1 tablet in the afternoon   ketoconazole (NIZORAL) 2 % shampoo Apply 1 application topically 2 (two) times a week.    latanoprost (XALATAN) 0.005 % ophthalmic solution Place 1 drop into both eyes every morning.    metoprolol succinate (TOPROL-XL) 25 MG 24 hr tablet Take 0.5 tablets (12.5 mg total) by mouth daily.   Misc Natural Products (OSTEO BI-FLEX/5-LOXIN ADVANCED) TABS Take 2 tablets by mouth daily.   potassium chloride SA (KLOR-CON) 20 MEQ tablet Take 1 tablet (20 mEq total) by mouth daily.   sacubitril-valsartan (ENTRESTO) 49-51 MG Take 1 tablet by mouth 2 (two) times daily.   sertraline (ZOLOFT) 50 MG tablet Take 1 tablet (50 mg total) by mouth daily.   spironolactone (ALDACTONE) 25 MG tablet Take 1 tablet (25 mg total) by mouth daily.   traZODone (DESYREL) 50 MG tablet Take 0.5-1 tablets (25-50 mg total) by mouth at bedtime as needed for sleep. (Patient taking differently: Take 50 mg by mouth at bedtime as needed for sleep.)     Allergies:   Heparin   Social History   Socioeconomic History   Marital status: Married    Spouse name: Cindi   Number of children: 3   Years of education: Not on file   Highest education level: Not on file  Occupational History  Occupation: Architect  Tobacco Use   Smoking status: Never   Smokeless tobacco: Never  Vaping Use   Vaping Use: Never used  Substance and Sexual Activity   Alcohol use: Yes    Alcohol/week: 12.0 standard drinks    Types: 12 Cans of beer per week   Drug use: Yes    Frequency: 2.0 times per week    Types: Marijuana    Comment: gummies   Sexual activity: Not on file  Other Topics Concern   Not on file  Social History Narrative   Lives at home with wife.   Social Determinants of Health   Financial Resource Strain: Medium Risk   Difficulty of Paying Living Expenses: Somewhat hard  Food Insecurity: No Food Insecurity   Worried About Charity fundraiser in the Last Year: Never true   Ran Out of Food in the Last Year: Never true  Transportation Needs: No Transportation Needs   Lack of Transportation (Medical): No   Lack of  Transportation (Non-Medical): No  Physical Activity: Inactive   Days of Exercise per Week: 0 days   Minutes of Exercise per Session: 0 min  Stress: Stress Concern Present   Feeling of Stress : To some extent  Social Connections: Not on file     Family History: The patient's family history includes Alcohol abuse in his father; Breast cancer in his sister; Cancer in his mother. He was adopted.  ROS:   Please see the history of present illness.    All other systems reviewed and are negative.  EKGs/Labs/Other Studies Reviewed:    The following studies were reviewed today: Echocardiogram: IMPRESSIONS     1. Left ventricular ejection fraction, by estimation, is 45 to 50%. The  left ventricle has mildly decreased function. The left ventricle  demonstrates global hypokinesis. There is moderate left ventricular  hypertrophy. Left ventricular diastolic  parameters are consistent with Grade I diastolic dysfunction (impaired  relaxation).   2. Right ventricular systolic function is mildly reduced. The right  ventricular size is normal. Tricuspid regurgitation signal is inadequate  for assessing PA pressure.   3. Left atrial size was moderately dilated.   4. The mitral valve is abnormal. Trivial mitral valve regurgitation. Mild  mitral stenosis. The mean mitral valve gradient is 5.0 mmHg with average  heart rate of 70 bpm. Moderate mitral annular calcification.   5. The aortic valve has been repaired/replaced. Perivalvular aortic  regurgitation is moderate. There is a 25 mm [name] Sapien prosthetic  (TAVR) valve present in the aortic position. Echo findings are consistent  with perivalvular leak of the aortic  prosthesis. Aortic regurgitation PHT measures 325 msec. Aortic valve mean  gradient measures 10.0 mmHg. Aortic valve Vmax measures 2.12 m/s.   6. Aortic dilatation noted. There is moderate dilatation of the ascending  aorta, measuring 49 mm.   7. The inferior vena cava is normal  in size with greater than 50%  respiratory variability, suggesting right atrial pressure of 3 mmHg.   Comparison(s): 10/19/2020: LVEF 30-35%, mild perivalvular leak, aorta  dilated to 48 mm.   EKG:  EKG is not ordered today.    Recent Labs: 09/28/2020: ALT 24; B Natriuretic Peptide 2,498.6; TSH 1.620 10/20/2020: Magnesium 1.7 11/02/2020: BUN 23; Creatinine, Ser 1.25; Hemoglobin 9.8; Platelets 187; Potassium 5.2; Sodium 141  Recent Lipid Panel    Component Value Date/Time   CHOL 151 03/21/2020 0805   CHOL 132 08/07/2019 0811   TRIG 98.0 03/21/2020 0805   TRIG  162 (H) 10/25/2005 1507   HDL 67.70 03/21/2020 0805   HDL 61 08/07/2019 0811   CHOLHDL 2 03/21/2020 0805   VLDL 19.6 03/21/2020 0805   LDLCALC 63 03/21/2020 0805   LDLCALC 52 08/07/2019 0811   LDLDIRECT 151.0 01/05/2016 0748     Risk Assessment/Calculations:           Physical Exam:    VS:  BP (!) 92/58   Pulse 66   Ht 5\' 7"  (1.702 m)   Wt 210 lb (95.3 kg)   SpO2 97%   BMI 32.89 kg/m     Wt Readings from Last 3 Encounters:  11/15/20 210 lb (95.3 kg)  11/02/20 199 lb (90.3 kg)  10/21/20 215 lb 2.7 oz (97.6 kg)     GEN:  Well nourished, well developed in no acute distress HEENT: Normal NECK: No JVD; No carotid bruits LYMPHATICS: No lymphadenopathy CARDIAC: RRR, 1/6 systolic murmur at the right upper sternal border RESPIRATORY:  Clear to auscultation without rales, wheezing or rhonchi  ABDOMEN: Soft, non-tender, non-distended MUSCULOSKELETAL:  No edema; No deformity  SKIN: Warm and dry NEUROLOGIC:  Alert and oriented x 3 PSYCHIATRIC:  Normal affect   ASSESSMENT:    1. Aortic valve insufficiency, etiology of cardiac valve disease unspecified   2. Chronic combined systolic and diastolic heart failure (HCC)    PLAN:    In order of problems listed above:  The patient's echocardiogram is reviewed.  His LVEF has improved with cardiac resynchronization.  The patient's paravalvular regurgitation is worse  than it was on his postoperative day #1 study.  It is still markedly improved from baseline, but now is estimated in the moderate range.  Clinically he is doing very well with New York Heart Association functional class I symptoms.  Would continue his current medical program and plan a repeat echocardiogram in 6 months with an office visit in valve clinic at that time.  I am not sure of the mechanism that would account for change in his paravalvular regurgitation from his postprocedural echo, but it is possible the surgical bioprosthesis had some recoil to assume its natural confirmation.  In any event, we will follow this closely with serial echo studies.  Hopefully the patient will not have recurrent hemolysis or heart failure symptoms.  At present he seems to be doing very well. Clinically improved, treated with furosemide, metoprolol succinate, spironolactone, and Entresto.  Managed by Dr. Percival Spanish.     Medication Adjustments/Labs and Tests Ordered: Current medicines are reviewed at length with the patient today.  Concerns regarding medicines are outlined above.  Orders Placed This Encounter  Procedures   ECHOCARDIOGRAM COMPLETE   No orders of the defined types were placed in this encounter.   Patient Instructions  Medication Instructions:  Your physician recommends that you continue on your current medications as directed. Please refer to the Current Medication list given to you today.  *If you need a refill on your cardiac medications before your next appointment, please call your pharmacy*   Lab Work: None ordered   If you have labs (blood work) drawn today and your tests are completely normal, you will receive your results only by: [place] (if you have MyChart) OR A paper copy in the mail If you have any lab test that is abnormal or we need to change your treatment, we will call you to review the results.   Testing/Procedures: Your physician has requested that you have  an echocardiogram in 6 months . Echocardiography  is a painless test that uses sound waves to create images of your heart. It provides your doctor with information about the size and shape of your heart and how well your heart's chambers and valves are working. This procedure takes approximately one hour. There are no restrictions for this procedure.    Follow-Up: At [facility], you and your health needs are our priority.  As part of our continuing mission to provide you with exceptional heart care, we have created designated Provider Care Teams.  These Care Teams include your primary Cardiologist (physician) and Advanced Practice Providers (APPs -  Physician Assistants and Nurse Practitioners) who all work together to provide you with the care you need, when you need it.  We recommend signing up for the patient portal called "MyChart".  Sign up information is provided on this After Visit Summary.  MyChart is used to connect with patients for Virtual Visits (Telemedicine).  Patients are able to view lab/test results, encounter notes, upcoming appointments, etc.  Non-urgent messages can be sent to your provider as well.   To learn more about what you can do with MyChart, go to [email or web address].    Your next appointment:   6 month(s)  The format for your next appointment:   In Person  Provider:   Sherren Mocha, MD or Nell Range, PA-C{    Other Instructions None     Signed, Sherren Mocha, MD  11/15/2020 5:18 PM    [place]

## 2020-11-16 ENCOUNTER — Ambulatory Visit: Payer: Medicare HMO | Admitting: Physician Assistant

## 2020-11-16 ENCOUNTER — Other Ambulatory Visit (HOSPITAL_COMMUNITY): Payer: Medicare HMO

## 2020-11-17 ENCOUNTER — Inpatient Hospital Stay: Payer: Medicare HMO | Attending: Hematology and Oncology

## 2020-11-17 ENCOUNTER — Other Ambulatory Visit: Payer: Self-pay

## 2020-11-17 DIAGNOSIS — D509 Iron deficiency anemia, unspecified: Secondary | ICD-10-CM | POA: Diagnosis present

## 2020-11-17 LAB — CMP (CANCER CENTER ONLY)
ALT: 16 U/L (ref 0–44)
AST: 49 U/L — ABNORMAL HIGH (ref 15–41)
Albumin: 4.1 g/dL (ref 3.5–5.0)
Alkaline Phosphatase: 47 U/L (ref 38–126)
Anion gap: 10 (ref 5–15)
BUN: 15 mg/dL (ref 8–23)
CO2: 26 mmol/L (ref 22–32)
Calcium: 8.7 mg/dL — ABNORMAL LOW (ref 8.9–10.3)
Chloride: 104 mmol/L (ref 98–111)
Creatinine: 0.83 mg/dL (ref 0.61–1.24)
GFR, Estimated: >60 mL/min (ref >60)
Glucose, Bld: 113 mg/dL — ABNORMAL HIGH (ref 70–99)
Potassium: 4.1 mmol/L (ref 3.5–5.1)
Sodium: 140 mmol/L (ref 135–145)
Total Bilirubin: 1.3 mg/dL — ABNORMAL HIGH (ref 0.3–1.2)
Total Protein: 6.6 g/dL (ref 6.5–8.1)

## 2020-11-17 LAB — CBC WITH DIFFERENTIAL (CANCER CENTER ONLY)
Abs Immature Granulocytes: 0 10*3/uL (ref 0.00–0.07)
Basophils Absolute: 0 10*3/uL (ref 0.0–0.1)
Basophils Relative: 0 %
Eosinophils Absolute: 0.1 10*3/uL (ref 0.0–0.5)
Eosinophils Relative: 3 %
HCT: 29.5 % — ABNORMAL LOW (ref 39.0–52.0)
Hemoglobin: 9.6 g/dL — ABNORMAL LOW (ref 13.0–17.0)
Immature Granulocytes: 0 %
Lymphocytes Relative: 31 %
Lymphs Abs: 1 10*3/uL (ref 0.7–4.0)
MCH: 32.4 pg (ref 26.0–34.0)
MCHC: 32.5 g/dL (ref 30.0–36.0)
MCV: 99.7 fL (ref 80.0–100.0)
Monocytes Absolute: 0.3 10*3/uL (ref 0.1–1.0)
Monocytes Relative: 9 %
Neutro Abs: 1.9 10*3/uL (ref 1.7–7.7)
Neutrophils Relative %: 57 %
Platelet Count: 142 10*3/uL — ABNORMAL LOW (ref 150–400)
RBC: 2.96 MIL/uL — ABNORMAL LOW (ref 4.22–5.81)
RDW: 19.1 % — ABNORMAL HIGH (ref 11.5–15.5)
WBC Count: 3.3 10*3/uL — ABNORMAL LOW (ref 4.0–10.5)
nRBC: 0 % (ref 0.0–0.2)

## 2020-11-17 LAB — IRON AND TIBC
Iron: 92 ug/dL (ref 42–163)
Saturation Ratios: 31 % (ref 20–55)
TIBC: 298 ug/dL (ref 202–409)
UIBC: 206 ug/dL (ref 117–376)

## 2020-11-17 LAB — SAMPLE TO BLOOD BANK

## 2020-11-17 LAB — FERRITIN: Ferritin: 158 ng/mL (ref 24–336)

## 2020-11-18 ENCOUNTER — Other Ambulatory Visit: Payer: Self-pay | Admitting: Family Medicine

## 2020-11-18 NOTE — Telephone Encounter (Signed)
Left message for Gregory [name] to return call.  Need to find out what he is taking the Tramadol for.

## 2020-11-18 NOTE — Telephone Encounter (Signed)
PDMP reviewed.

## 2020-11-18 NOTE — Telephone Encounter (Signed)
Last office visit 09/15/2020 (virtual) for Iron Deficiency Anemia, HTN & Gout.  Last refilled alprazolam 10/26/2019 for #20 with no refills.  Tramadol not on current medication.  Note of refill states: The original prescription was discontinued on 10/02/2020 by Almyra Deforest, PA for the following reason: Stop Taking at Discharge.  No future appointments with PCP.

## 2020-11-18 NOTE — Telephone Encounter (Signed)
Pt returned call stated he a taking Tramadol for his knee pain would like to have it refill Please Advise  [phone]

## 2020-11-20 NOTE — Progress Notes (Signed)
HEMATOLOGY-ONCOLOGY MYCHART VIDEO VISIT PROGRESS NOTE  I connected with Gregory [name] on 11/21/2020 at  8:45 AM EST by MyChart video conference and verified that I am speaking with the correct person using two identifiers.  I discussed the limitations, risks, security and privacy concerns of performing an evaluation and management service by MyChart and the availability of in person appointments.  I also discussed with the patient that there may be a patient responsible charge related to this service. The patient expressed understanding and agreed to proceed.  Patient's Location: Home Physician Location: Clinic  CHIEF COMPLIANT: Follow-up of heparin-induced thrombocytopenia   INTERVAL HISTORY: Gregory [name] is a 70 y.o. male with above-mentioned history of Heparin-induced thrombocytopenia who is currently on anticoagulation with Coumadin. Labs on 11/17/2020 showed WBC WBC 3.3, Hgb 9.6, and HCT 29.5. He presents to the clinic today for follow-up.  Observations/Objective:  There were no vitals filed for this visit. There is no height or weight on file to calculate BMI.  I have reviewed the data as listed CMP Latest Ref Rng & Units 11/17/2020 11/02/2020 10/21/2020  Glucose 70 - 99 mg/dL 113(H) 94 109(H)  BUN 8 - 23 mg/dL 15 23 15   Creatinine 0.61 - 1.24 mg/dL 0.83 1.25 0.91  Sodium 135 - 145 mmol/L 140 141 138  Potassium 3.5 - 5.1 mmol/L 4.1 5.2 4.5  Chloride 98 - 111 mmol/L 104 103 105  CO2 22 - 32 mmol/L 26 22 27   Calcium 8.9 - 10.3 mg/dL 8.7(L) 9.5 8.9  Total Protein 6.5 - 8.1 g/dL 6.6 - -  Total Bilirubin 0.3 - 1.2 mg/dL 1.3(H) - -  Alkaline Phos 38 - 126 U/L 47 - -  AST 15 - 41 U/L 49(H) - -  ALT 0 - 44 U/L 16 - -    Lab Results  Component Value Date   WBC 3.3 (L) 11/17/2020   HGB 9.6 (L) 11/17/2020   HCT 29.5 (L) 11/17/2020   MCV 99.7 11/17/2020   PLT 142 (L) 11/17/2020   NEUTROABS 1.9 11/17/2020      Assessment Plan:  Iron deficiency anemia Work-up: TSH:  2.56: Normal CMP: AST 43, ALT 24, bilirubin 1.9, LDH 1004 (direct Coombs test negative) SPEP: No M protein Reticulocyte count: 3.8%, absolute reticulocyte count 122, reticulocyte hemoglobin: 23.9 (suggestive of iron deficiency or ineffective erythropoiesis) O16: 073, folic acid 71.0 Erythropoietin: 235.3 09/16/2020: Iron studies revealed ferritin of 14 and iron saturation of 5.3% (IV iron treatment)  Elevated LDH with normal direct Coombs test: Concern for intravascular hemolysis possibly from severe valvular heart disease 11/17/2020: Hemoglobin 9.6, WBC 3.3, platelets 142, ANC 1.9, ferritin 158, iron saturation 31% (improved from 5%) No indication for blood transfusion.  He may undergo knee replacement surgery sometime next year.  I discussed with him that he might need blood transfusion postoperatively if necessary.  We can help support him through the surgery if needed.  Continue to watch and monitor with recheck labs in 4 months and follow-up after that with a Mychart virtual visit.    I discussed the assessment and treatment plan with the patient. The patient was provided an opportunity to ask questions and all were answered. The patient agreed with the plan and demonstrated an understanding of the instructions. The patient was advised to call back or seek an in-person evaluation if the symptoms worsen or if the condition fails to improve as anticipated.   Total time spent: 20 minutes including face-to-face MyChart video visit time and time spent for  planning, charting and coordination of care  Rulon Eisenmenger, MD 11/21/2020  I, Thana Ates am acting as scribe for Nicholas Lose, MD.  I have reviewed the above documentation for accuracy and completeness, and I agree with the above.

## 2020-11-21 ENCOUNTER — Encounter: Payer: Self-pay | Admitting: Cardiology

## 2020-11-21 ENCOUNTER — Telehealth (HOSPITAL_BASED_OUTPATIENT_CLINIC_OR_DEPARTMENT_OTHER): Payer: Medicare HMO | Admitting: Hematology and Oncology

## 2020-11-21 DIAGNOSIS — D509 Iron deficiency anemia, unspecified: Secondary | ICD-10-CM | POA: Diagnosis not present

## 2020-11-21 NOTE — Assessment & Plan Note (Signed)
Work-up: TSH: 2.56: Normal CMP: AST 43, ALT 24, bilirubin 1.9, LDH 1004 (direct Coombs test negative) SPEP: No M protein Reticulocyte count: 3.8%, absolute reticulocyte count 122, reticulocyte hemoglobin: 23.9 (suggestive of iron deficiency or ineffective erythropoiesis) G52: 479, folic acid 98.0 Erythropoietin: 235.3 09/16/2020: Iron studies revealed ferritin of 14 and iron saturation of 5.3% (IV iron treatment)  Elevated LDH with normal direct Coombs test: Concern for intravascular hemolysis possibly from severe valvular heart disease 11/17/2020: Hemoglobin 9.6, WBC 3.3, platelets 142, ANC 1.9, ferritin 158, iron saturation 31% (improved from 5%) No indication for blood transfusion.  Continue to watch and monitor with recheck labs in 3 months and follow-up after that with a telephone visit.

## 2020-11-30 DIAGNOSIS — I351 Nonrheumatic aortic (valve) insufficiency: Secondary | ICD-10-CM | POA: Diagnosis not present

## 2020-11-30 DIAGNOSIS — I10 Essential (primary) hypertension: Secondary | ICD-10-CM

## 2020-11-30 DIAGNOSIS — E785 Hyperlipidemia, unspecified: Secondary | ICD-10-CM

## 2020-12-02 DIAGNOSIS — I509 Heart failure, unspecified: Secondary | ICD-10-CM | POA: Diagnosis not present

## 2020-12-13 ENCOUNTER — Other Ambulatory Visit: Payer: Self-pay | Admitting: Cardiology

## 2020-12-14 DIAGNOSIS — H40013 Open angle with borderline findings, low risk, bilateral: Secondary | ICD-10-CM | POA: Diagnosis not present

## 2020-12-19 NOTE — Progress Notes (Signed)
Subjective:   Gregory [name] is a 70 y.o. male who presents for Medicare Annual/Subsequent preventive examination.  I connected with Gregory [name] today by telephone and verified that I am speaking with the correct person using two identifiers. Location patient: home Location provider: work Persons participating in the virtual visit: patient, Marine scientist.    I discussed the limitations, risks, security and privacy concerns of performing an evaluation and management service by telephone and the availability of in person appointments. I also discussed with the patient that there may be a patient responsible charge related to this service. The patient expressed understanding and verbally consented to this telephonic visit.    Interactive audio and video telecommunications were attempted between this provider and patient, however failed, due to patient having technical difficulties OR patient did not have access to video capability.  We continued and completed visit with audio only.  Some vital signs may be absent or patient reported.   Time Spent with patient on telephone encounter: 20 minutes  Review of Systems     Cardiac Risk Factors include: advanced age (>56men, >54 women);hypertension;dyslipidemia     Objective:    Today's Vitals   12/20/20 0857  Weight: 205 lb (93 kg)  Height: 5\' 7"  (1.702 m)  PainSc: 7    Body mass index is 32.11 kg/m.  Advanced Directives 12/20/2020 10/17/2020 10/10/2020 09/28/2020 09/09/2020 12/18/2019 03/04/2019  Does Patient Have a Medical Advance Directive? Yes No No No No No -  Type of Paramedic of [place];Living will - - - - - -  Does patient want to make changes to medical advance directive? Yes (MAU/Ambulatory/Procedural Areas - Information given) - - - - - -  Would patient like information on creating a medical advance directive? - No - Patient declined No - Patient declined - - No - Patient declined No - Patient declined     Current Medications (verified) Outpatient Encounter Medications as of 12/20/2020  Medication Sig   acetaminophen (TYLENOL) 500 MG tablet Take 1,000 mg by mouth every 6 (six) hours as needed for moderate pain.   allopurinol (ZYLOPRIM) 100 MG tablet Take 1 tablet (100 mg total) by mouth daily.   ALPRAZolam (XANAX) 0.5 MG tablet TAKE ONE TABLET BY MOUTH ONCE DAILY AS NEEDED FOR ANIXETY. TAKE 30 MINUTES PRIOR TO PLANE FLIGHT OR AS NEEDED FOR ANXIETY   aspirin EC 81 MG tablet Take 1 tablet (81 mg total) by mouth daily. Swallow whole.   atorvastatin (LIPITOR) 80 MG tablet Take 1 tablet (80 mg total) by mouth daily.   colchicine 0.6 MG tablet Take 2 tablets by mouth once daily   furosemide (LASIX) 20 MG tablet Take 2 tablets in the morning and 1 tablet in the afternoon   ketoconazole (NIZORAL) 2 % shampoo Apply 1 application topically 2 (two) times a week.   latanoprost (XALATAN) 0.005 % ophthalmic solution Place 1 drop into both eyes every morning.    metoprolol succinate (TOPROL-XL) 25 MG 24 hr tablet Take 0.5 tablets (12.5 mg total) by mouth daily.   Misc Natural Products (OSTEO BI-FLEX/5-LOXIN ADVANCED) TABS Take 2 tablets by mouth daily.   potassium chloride SA (KLOR-CON) 20 MEQ tablet Take 1 tablet (20 mEq total) by mouth daily.   sacubitril-valsartan (ENTRESTO) 49-51 MG Take 1 tablet by mouth 2 (two) times daily.   sertraline (ZOLOFT) 50 MG tablet Take 1 tablet (50 mg total) by mouth daily.   spironolactone (ALDACTONE) 25 MG tablet Take 1 tablet (25 mg  total) by mouth daily.   traMADol (ULTRAM) 50 MG tablet TAKE 1 TABLET BY MOUTH EVERY 4 HOURS AS NEEDED FOR PAIN   traZODone (DESYREL) 50 MG tablet Take 0.5-1 tablets (25-50 mg total) by mouth at bedtime as needed for sleep. (Patient taking differently: Take 50 mg by mouth at bedtime as needed for sleep.)   No facility-administered encounter medications on file as of 12/20/2020.    Allergies (verified) Heparin   History: Past Medical  History:  Diagnosis Date   Alcohol abuse    Anxiety    Aortic valve disease    Arthritis    Coronary artery disease    Glaucoma    Hypertension    OSA (obstructive sleep apnea)    Pre-diabetes    S/P balloon aortic valvuloplasty 10/18/2020   done for bioprosthetic aortic valve replacement perivalvular leaking reducing it from severe to mild.   Shingles    Past Surgical History:  Procedure Laterality Date   AORTIC ARCH ANGIOGRAPHY N/A 10/10/2020   Procedure: AORTIC ARCH ANGIOGRAPHY;  Surgeon: Nelva Bush, MD;  Location: [place] [facility];  Service: Cardiovascular;  Laterality: N/A;   AORTIC VALVE REPLACEMENT N/A 03/06/2019   Procedure: AORTIC VALVE REPLACEMENT (AVR), USING INTUITY 25MM;  Surgeon: Wonda Olds, MD;  Location: [place];  Service: Open Heart Surgery;  Laterality: N/A;   BALLOON AORTIC VALVE VALVULOPLASTY N/A 10/18/2020   Procedure: BALLOON AORTIC VALVE VALVULOPLASTY;  Surgeon: Sherren Mocha, MD;  Location: [place];  Service: Cardiovascular;  Laterality: N/A;   BIV PACEMAKER INSERTION CRT-P N/A 10/20/2020   Procedure: BIV PACEMAKER INSERTION CRT-P;  Surgeon: Vickie Epley, MD;  Location: [place] [facility];  Service: Cardiovascular;  Laterality: N/A;   CARDIAC CATHETERIZATION     CATARACT EXTRACTION     CORONARY ARTERY BYPASS GRAFT N/A 03/06/2019   Procedure: CORONARY ARTERY BYPASS GRAFTING (CABG), ON PUMP, TIMES THREE, USING BILATERAL INTERNAL MAMMARIES AND LEFT RADIAL ARTERY HARVEST;  Surgeon: Wonda Olds, MD;  Location: [place];  Service: Open Heart Surgery;  Laterality: N/A;  BILATERAL IMA   LEFT HEART CATH AND CORONARY ANGIOGRAPHY N/A 03/04/2019   Procedure: LEFT HEART CATH AND CORONARY ANGIOGRAPHY;  Surgeon: Burnell Blanks, MD;  Location: [place] [facility];  Service: Cardiovascular;  Laterality: N/A;   None     RADIAL ARTERY HARVEST Left 03/06/2019   Procedure: RADIAL ARTERY HARVEST;  Surgeon: Wonda Olds, MD;  Location: [place];  Service:  Open Heart Surgery;  Laterality: Left;   RIGHT HEART CATH AND CORONARY/GRAFT ANGIOGRAPHY N/A 10/10/2020   Procedure: RIGHT HEART CATH AND CORONARY/GRAFT ANGIOGRAPHY;  Surgeon: Nelva Bush, MD;  Location: [place] [facility];  Service: Cardiovascular;  Laterality: N/A;   TEE WITHOUT CARDIOVERSION N/A 03/06/2019   Procedure: TRANSESOPHAGEAL ECHOCARDIOGRAM (TEE);  Surgeon: Wonda Olds, MD;  Location: [place];  Service: Open Heart Surgery;  Laterality: N/A;   TEE WITHOUT CARDIOVERSION N/A 10/10/2020   Procedure: TRANSESOPHAGEAL ECHOCARDIOGRAM (TEE);  Surgeon: Josue Hector, MD;  Location: [facility];  Service: Cardiovascular;  Laterality: N/A;   TEE WITHOUT CARDIOVERSION N/A 10/18/2020   Procedure: TRANSESOPHAGEAL ECHOCARDIOGRAM (TEE);  Surgeon: Sherren Mocha, MD;  Location: [place];  Service: Open Heart Surgery;  Laterality: N/A;   TEMPORARY PACEMAKER N/A 10/17/2020   Procedure: TEMPORARY PACEMAKER;  Surgeon: Evans Lance, MD;  Location: [place] [facility];  Service: Cardiovascular;  Laterality: N/A;   Family History  Adopted: Yes  Problem Relation Age of Onset   Cancer  Mother        breast cancer   Alcohol abuse Father    Breast cancer Sister    Social History   Socioeconomic History   Marital status: Married    Spouse name: Cindi   Number of children: 3   Years of education: Not on file   Highest education level: Not on file  Occupational History   Occupation: Architect  Tobacco Use   Smoking status: Never   Smokeless tobacco: Never  Vaping Use   Vaping Use: Never used  Substance and Sexual Activity   Alcohol use: Yes    Alcohol/week: 12.0 standard drinks    Types: 12 Cans of beer per week   Drug use: Yes    Frequency: 2.0 times per week    Types: Marijuana    Comment: gummies   Sexual activity: Not on file  Other Topics Concern   Not on file  Social History Narrative   Lives at home with wife.   Social Determinants of Health   Financial Resource  Strain: Low Risk    Difficulty of Paying Living Expenses: Not very hard  Food Insecurity: No Food Insecurity   Worried About Charity fundraiser in the Last Year: Never true   Ran Out of Food in the Last Year: Never true  Transportation Needs: No Transportation Needs   Lack of Transportation (Medical): No   Lack of Transportation (Non-Medical): No  Physical Activity: Sufficiently Active   Days of Exercise per Week: 5 days   Minutes of Exercise per Session: 150+ min  Stress: No Stress Concern Present   Feeling of Stress : Not at all  Social Connections: Moderately Isolated   Frequency of Communication with Friends and Family: Once a week   Frequency of Social Gatherings with Friends and Family: Three times a week   Attends Religious Services: Never   Active Member of Clubs or Organizations: No   Attends Archivist Meetings: Never   Marital Status: Married    Tobacco Counseling Counseling given: Not Answered   Clinical Intake:  Pre-visit preparation completed: Yes  Pain : 0-10 Pain Score: 7  Pain Location: Knee Pain Orientation: Right, Left     BMI - recorded: 32.88 Nutritional Status: BMI > 30  Obese Nutritional Risks: None Diabetes: No  How often do you need to have someone help you when you read instructions, pamphlets, or other written materials from your doctor or pharmacy?: 1 - Never  Diabetic? No  Interpreter Needed?: No  Information entered by :: Orrin Brigham LPN   Activities of Daily Living In your present state of health, do you have any difficulty performing the following activities: 12/20/2020 10/20/2020  Hearing? Y N  Comment patient states decrease in hearing -  Vision? Y N  Comment wears glasses -  Difficulty concentrating or making decisions? N N  Walking or climbing stairs? Y Y  Comment plans on getting knee replacements -  Dressing or bathing? N N  Doing errands, shopping? N N  Preparing Food and eating ? N -  Using the Toilet?  N -  In the past six months, have you accidently leaked urine? N -  Do you have problems with loss of bowel control? N -  Managing your Medications? N -  Managing your Finances? N -  Housekeeping or managing your Housekeeping? N -  Some recent data might be hidden    Patient Care Team: Jinny Sanders, MD as PCP - General (Family  Medicine) [name], MD as PCP - Cardiology (Cardiology) Lendon Colonel, NP as Nurse Practitioner (Cardiology) Debbora Dus, [facility] as Pharmacist (Pharmacist)  Indicate any recent Medical Services you may have received from other than [facility] providers in the past year (date may be approximate).     Assessment:   This is a routine wellness examination for Jiyan.  Hearing/Vision screen Hearing Screening - Comments:: Decrease in hearing Vision Screening - Comments:: Last exam 12/14/20. Dr. Nicki Reaper  Dietary issues and exercise activities discussed: Current Exercise Habits: The patient has a physically strenuous job, but has no regular exercise apart from work.   Goals Addressed             This Visit's Progress    Patient Stated       Would like to get below 200lbs , get both knees replaced and walk [place] the beach with wife.       Depression Screen PHQ 2/9 Scores 12/20/2020 12/18/2019 07/20/2019 05/26/2019 08/14/2018 12/18/2016 01/11/2015  PHQ - 2 Score 0 0 0 0 4 2 0  PHQ- 9 Score - 0 - - 15 - -  Exception Documentation - - - - Medical reason - -    Fall Risk Fall Risk  12/20/2020 12/18/2019 05/26/2019 01/30/2018 12/18/2016  Falls in the past year? 0 0 0 0 No  Number falls in past yr: 0 0 - - -  Injury with Fall? 0 0 - - -  Risk for fall due to : - Impaired balance/gait;Medication side effect Other (Comment) - -  Risk for fall due to: Comment - - Balance screen, single leg stand less than 5 seconds. - -  Follow up Falls prevention discussed Falls evaluation completed;Falls prevention discussed Falls evaluation completed - -    FALL RISK  PREVENTION PERTAINING TO THE HOME:  Any stairs in or around the home? Yes  If so, are there any without handrails? No  Home free of loose throw rugs in walkways, pet beds, electrical cords, etc? Yes  Adequate lighting in your home to reduce risk of falls? Yes   ASSISTIVE DEVICES UTILIZED TO PREVENT FALLS:  Life alert? No  Use of a cane, walker or w/c? Yes , cane Grab bars in the bathroom? Yes  Shower chair or bench in shower? Yes  Elevated toilet seat or a handicapped toilet? Yes   TIMED UP AND GO:  Was the test performed? No , visit completed over the phone.   Cognitive Function: Normal cognitive status assessed by this Nurse Health Advisor. No abnormalities found.   MMSE - Mini Mental State Exam 12/18/2019  Orientation to time 5  Orientation to Place 5  Registration 3  Attention/ Calculation 5  Recall 3  Language- repeat 1        Immunizations Immunization History  Administered Date(s) Administered   Fluad Quad(high Dose 65+) 12/22/2019   Influenza,inj,Quad PF,6+ Mos 11/11/2018   Moderna Sars-Covid-2 Vaccination 01/27/2019, 02/24/2019, 10/31/2019   Pneumococcal Conjugate-13 01/11/2015   Pneumococcal Polysaccharide-23 04/17/2016   Tdap 02/25/2012    TDAP status: Up to date  Flu Vaccine status: Due, Education has been provided regarding the importance of this vaccine. Advised may receive this vaccine at local pharmacy or Health Dept. Aware to provide a copy of the vaccination record if obtained from local pharmacy or Health Dept. Verbalized acceptance and understanding.  Pneumococcal vaccine status: Up to date  Covid-19 vaccine status: Information provided on how to obtain vaccines.   Qualifies for Shingles Vaccine? Yes  Zostavax completed No   Shingrix Completed?: No.    Education has been provided regarding the importance of this vaccine. Patient has been advised to call insurance company to determine out of pocket expense if they have not yet received this  vaccine. Advised may also receive vaccine at local pharmacy or Health Dept. Verbalized acceptance and understanding.  Screening Tests Health Maintenance  Topic Date Due   Zoster Vaccines- Shingrix (1 of 2) Never done   Fecal DNA (Cologuard)  Never done   COVID-19 Vaccine (4 - Booster for Moderna series) 12/26/2019   INFLUENZA VACCINE  08/01/2020   TETANUS/TDAP  02/24/2022   Pneumonia Vaccine 53+ Years old  Completed   Hepatitis C Screening  Completed   HPV VACCINES  Aged Out    Health Maintenance  Health Maintenance Due  Topic Date Due   Zoster Vaccines- Shingrix (1 of 2) Never done   Fecal DNA (Cologuard)  Never done   COVID-19 Vaccine (4 - Booster for Moderna series) 12/26/2019   INFLUENZA VACCINE  08/01/2020    Colorectal Cancer screening: patient states that screening was done at the hospital and was told there are no issues.   Lung Cancer Screening: (Low Dose CT Chest recommended if Age 33-80 years, 30 pack-year currently smoking OR have quit w/in 15years.) does not qualify.    Additional Screening:  Hepatitis C Screening: does qualify; Completed 04/11/16  Vision Screening: Recommended annual ophthalmology exams for early detection of glaucoma and other disorders of the eye. Is the patient up to date with their annual eye exam?  Yes  Who is the provider or what is the name of the office in which the patient attends annual eye exams? Dr. Nicki Reaper   Dental Screening: Recommended annual dental exams for proper oral hygiene  Community Resource Referral / Chronic Care Management: CRR required this visit?  No   CCM required this visit?  No      Plan:     I have personally reviewed and noted the following in the patients chart:   Medical and social history Use of alcohol, tobacco or illicit drugs  Current medications and supplements including opioid prescriptions. Patient is currently taking opioid prescriptions. Information provided to patient regarding non-opioid  alternatives. Patient advised to discuss non-opioid treatment plan with their provider. Functional ability and status Nutritional status Physical activity Advanced directives List of other physicians Hospitalizations, surgeries, and ER visits in previous 12 months Vitals Screenings to include cognitive, depression, and falls Referrals and appointments  In addition, I have reviewed and discussed with patient certain preventive protocols, quality metrics, and best practice recommendations. A written personalized care plan for preventive services as well as general preventive health recommendations were provided to patient.   Due to this being a telephonic visit, the after visit summary with patients personalized plan was offered to patient via mail or my-chart. Patient would like to access on my-chart.     Loma Messing, LPN   [id number]   Nurse Health Advisor  Nurse Notes: none

## 2020-12-20 ENCOUNTER — Ambulatory Visit (INDEPENDENT_AMBULATORY_CARE_PROVIDER_SITE_OTHER): Payer: Medicare HMO

## 2020-12-20 ENCOUNTER — Encounter: Payer: Self-pay | Admitting: Cardiology

## 2020-12-20 VITALS — Ht 67.0 in | Wt 205.0 lb

## 2020-12-20 DIAGNOSIS — Z0000 Encounter for general adult medical examination without abnormal findings: Secondary | ICD-10-CM | POA: Diagnosis not present

## 2020-12-20 NOTE — Patient Instructions (Signed)
Gregory [name] , Thank you for taking time to complete your Medicare Wellness Visit. I appreciate your ongoing commitment to your health goals. Please review the following plan we discussed and let me know if I can assist you in the future.   Screening recommendations/referrals: Colonoscopy: per our conversation screening was done at the hospital Recommended yearly ophthalmology/optometry visit for glaucoma screening and checkup Recommended yearly dental visit for hygiene and checkup  Vaccinations: Influenza vaccine: Due-May obtain vaccine at our office or your local pharmacy. Pneumococcal vaccine: up to date Tdap vaccine: up to date, completed 02/25/12, due 02/24/22 Shingles vaccine: Discuss with your local pharmacy   Covid-19: newest booster available at your local pharmacy  Advanced directives: Please bring a copy of Living Will and/or [place] for your chart.   Conditions/risks identified: see problem list  Next appointment: Follow up in one year for your annual wellness visit. 12/22/21 @ 8:15am, this will be a telephone visit.   Preventive Care 43 Years and Older, Male Preventive care refers to lifestyle choices and visits with your health care provider that can promote health and wellness. What does preventive care include? A yearly physical exam. This is also called an annual well check. Dental exams once or twice a year. Routine eye exams. Ask your health care provider how often you should have your eyes checked. Personal lifestyle choices, including: Daily care of your teeth and gums. Regular physical activity. Eating a healthy diet. Avoiding tobacco and drug use. Limiting alcohol use. Practicing safe sex. Taking low doses of aspirin every day. Taking vitamin and mineral supplements as recommended by your health care provider. What happens during an annual well check? The services and screenings done by your health care provider during your annual well  check will depend on your age, overall health, lifestyle risk factors, and family history of disease. Counseling  Your health care provider may ask you questions about your: Alcohol use. Tobacco use. Drug use. Emotional well-being. Home and relationship well-being. Sexual activity. Eating habits. History of falls. Memory and ability to understand (cognition). Work and work Statistician. Screening  You may have the following tests or measurements: Height, weight, and BMI. Blood pressure. Lipid and cholesterol levels. These may be checked every 5 years, or more frequently if you are over 21 years old. Skin check. Lung cancer screening. You may have this screening every year starting at age 43 if you have a 30-pack-year history of smoking and currently smoke or have quit within the past 15 years. Fecal occult blood test (FOBT) of the stool. You may have this test every year starting at age 30. Flexible sigmoidoscopy or colonoscopy. You may have a sigmoidoscopy every 5 years or a colonoscopy every 10 years starting at age 4. Prostate cancer screening. Recommendations will vary depending on your family history and other risks. Hepatitis C blood test. Hepatitis B blood test. Sexually transmitted disease (STD) testing. Diabetes screening. This is done by checking your blood sugar (glucose) after you have not eaten for a while (fasting). You may have this done every 1-3 years. Abdominal aortic aneurysm (AAA) screening. You may need this if you are a current or former smoker. Osteoporosis. You may be screened starting at age 43 if you are at high risk. Talk with your health care provider about your test results, treatment options, and if necessary, the need for more tests. Vaccines  Your health care provider may recommend certain vaccines, such as: Influenza vaccine. This is recommended every year. Tetanus,  diphtheria, and acellular pertussis (Tdap, Td) vaccine. You may need a Td booster  every 10 years. Zoster vaccine. You may need this after age 61. Pneumococcal 13-valent conjugate (PCV13) vaccine. One dose is recommended after age 39. Pneumococcal polysaccharide (PPSV23) vaccine. One dose is recommended after age 41. Talk to your health care provider about which screenings and vaccines you need and how often you need them. This information is not intended to replace advice given to you by your health care provider. Make sure you discuss any questions you have with your health care provider. Document Released: 01/14/2015 Document Revised: 09/07/2015 Document Reviewed: 10/19/2014 Elsevier Interactive Patient Education  2017 [place] [employer] Prevention in the Home Falls can cause injuries. They can happen to people of all ages. There are many things you can do to make your home safe and to help prevent falls. What can I do on the outside of my home? Regularly fix the edges of walkways and driveways and fix any cracks. Remove anything that might make you trip as you walk through a door, such as a raised step or threshold. Trim any bushes or trees on the path to your home. Use bright outdoor lighting. Clear any walking paths of anything that might make someone trip, such as rocks or tools. Regularly check to see if handrails are loose or broken. Make sure that both sides of any steps have handrails. Any raised decks and porches should have guardrails on the edges. Have any leaves, snow, or ice cleared regularly. Use sand or salt on walking paths during winter. Clean up any spills in your garage right away. This includes oil or grease spills. What can I do in the bathroom? Use night lights. Install grab bars by the toilet and in the tub and shower. Do not use towel bars as grab bars. Use non-skid mats or decals in the tub or shower. If you need to sit down in the shower, use a plastic, non-slip stool. Keep the floor dry. Clean up any water that spills on the floor as soon  as it happens. Remove soap buildup in the tub or shower regularly. Attach bath mats securely with double-sided non-slip rug tape. Do not have throw rugs and other things on the floor that can make you trip. What can I do in the bedroom? Use night lights. Make sure that you have a light by your bed that is easy to reach. Do not use any sheets or blankets that are too big for your bed. They should not hang down onto the floor. Have a firm chair that has side arms. You can use this for support while you get dressed. Do not have throw rugs and other things on the floor that can make you trip. What can I do in the kitchen? Clean up any spills right away. Avoid walking on wet floors. Keep items that you use a lot in easy-to-reach places. If you need to reach something above you, use a strong step stool that has a grab bar. Keep electrical cords out of the way. Do not use floor polish or wax that makes floors slippery. If you must use wax, use non-skid floor wax. Do not have throw rugs and other things on the floor that can make you trip. What can I do with my stairs? Do not leave any items on the stairs. Make sure that there are handrails on both sides of the stairs and use them. Fix handrails that are broken or loose. Make  sure that handrails are as long as the stairways. Check any carpeting to make sure that it is firmly attached to the stairs. Fix any carpet that is loose or worn. Avoid having throw rugs at the top or bottom of the stairs. If you do have throw rugs, attach them to the floor with carpet tape. Make sure that you have a light switch at the top of the stairs and the bottom of the stairs. If you do not have them, ask someone to add them for you. What else can I do to help prevent falls? Wear shoes that: Do not have high heels. Have rubber bottoms. Are comfortable and fit you well. Are closed at the toe. Do not wear sandals. If you use a stepladder: Make sure that it is fully  opened. Do not climb a closed stepladder. Make sure that both sides of the stepladder are locked into place. Ask someone to hold it for you, if possible. Clearly mark and make sure that you can see: Any grab bars or handrails. First and last steps. Where the edge of each step is. Use tools that help you move around (mobility aids) if they are needed. These include: Canes. Walkers. Scooters. Crutches. Turn on the lights when you go into a dark area. Replace any light bulbs as soon as they burn out. Set up your furniture so you have a clear path. Avoid moving your furniture around. If any of your floors are uneven, fix them. If there are any pets around you, be aware of where they are. Review your medicines with your doctor. Some medicines can make you feel dizzy. This can increase your chance of falling. Ask your doctor what other things that you can do to help prevent falls. This information is not intended to replace advice given to you by your health care provider. Make sure you discuss any questions you have with your health care provider. Document Released: 10/14/2008 Document Revised: 05/26/2015 Document Reviewed: 01/22/2014 Elsevier Interactive Patient Education  2017 [employer].

## 2020-12-28 ENCOUNTER — Telehealth: Payer: Self-pay

## 2020-12-28 NOTE — Telephone Encounter (Signed)
° °  Pre-operative Risk Assessment    Patient Name: Gregory [name]  DOB: 1950-04-23 MRN: [id number]      Request for Surgical Clearance    Procedure:   Left total knee arthroplasty  Date of Surgery:  Clearance TBD                                 Surgeon:  Dr. Rod Can Surgeon's Group or Practice Name:  [facility] Phone number:  [phone] Fax number:  [phone]   Type of Clearance Requested:   - Medical    Type of Anesthesia:   Choice   Additional requests/questions:    SignedJacqulynn Cadet   12/28/2020, 1:43 PM

## 2020-12-29 NOTE — Telephone Encounter (Signed)
° °  Primary Cardiologist: [name], MD  Chart reviewed as part of pre-operative protocol coverage. Given past medical history and time since last visit, based on ACC/AHA guidelines, Gregory [name] would be at acceptable risk for the planned procedure without further cardiovascular testing.   RCRI indicates class III risk, 6.6% risk of major cardiac event. Patient reports METS>4.   Patient was advised that if he develops new symptoms prior to surgery to contact our office to arrange a follow-up appointment.  He verbalized understanding.  Patient takes Aspirin 81 mg daily. Ideally, patient will continue Aspirin 81 mg daily through the perioperative period. However, if Aspirin needs to be held prior to surgery, please contact our office.   I will route this recommendation to the requesting party via Epic fax function and remove from pre-op pool.  Please call with questions.  Lenna Sciara, NP 12/29/2020, 9:31 AM

## 2021-01-04 NOTE — Progress Notes (Signed)
Cardiology Office Note   Date:  01/05/2021   ID:  Gregory, [name] [birth year], MRN [id number]  PCP:  Jinny Sanders, MD  Cardiologist:   [name], MD   Chief Complaint  Patient presents with   AVR       History of Present Illness: Gregory [name] is a 71 y.o. male for follow up of CAD/CABG and AVR.   He had a cardiac catheterization during recent hospitalization on 03/04/2019 which revealed severe calcific disease in the mid LAD, the circumflex had mild to moderate proximal stenosis, the RCA, a large dominant vessel, had severe proximal stenosis and severe mid stenosis with poor flow into the distal vessel.  He was also found to have severe aortic regurgitation and moderate aortic valve stenosis, with a mean gradient across aortic valve 46mmHg with AVA, but judged to be severe aortic insufficiency in the setting of dilated aortic root with high flow from AI leading to elevated gradient across aortic valve.  He underwent coronary artery bypass graft with aortic valve replacement on 03/06/2019.  The patient had LIMA to LAD, left radial artery to PDA, predicted RIMA to the right posterior lateral artery, with open left radial artery harvest, bilateral IMA harvesting.  Also, 25 mm [name] Intuity Bovine bioprosthetic aortic valve placement.  Of note, the patient had a severe reaction to heparin with HIT and severe profound thrombocytopenia postoperatively. He had postoperative atrial fibrillation and therefore was started on amiodarone 200 mg daily.  He had a left arm hematoma and an elevated INR.  He was followed by heme.  On a follow an echo he a perivalvular leak.   He was admitted with HB and heart failure and hs severe paravalvular leak.  He eventually balloon valvuloplasty of his prosthetic valve and CRTD pacing.   On follow up echo the paravalvular leak was moderate.    He presents for follow up.  He has actually done relatively well.  He denies any chest pressure, neck or arm  discomfort.  He thinks his breathing is better than when he was initially admitted.  He is mostly limited by his significant bilateral knee pain.  He hopes to have surgery soon.   Past Medical History:  Diagnosis Date   Alcohol abuse    Anxiety    Aortic valve disease    Arthritis    Coronary artery disease    Glaucoma    Hypertension    OSA (obstructive sleep apnea)    Pre-diabetes    S/P balloon aortic valvuloplasty 10/18/2020   done for bioprosthetic aortic valve replacement perivalvular leaking reducing it from severe to mild.   Shingles     Past Surgical History:  Procedure Laterality Date   AORTIC ARCH ANGIOGRAPHY N/A 10/10/2020   Procedure: AORTIC ARCH ANGIOGRAPHY;  Surgeon: Nelva Bush, MD;  Location: [place] [facility];  Service: Cardiovascular;  Laterality: N/A;   AORTIC VALVE REPLACEMENT N/A 03/06/2019   Procedure: AORTIC VALVE REPLACEMENT (AVR), USING INTUITY 25MM;  Surgeon: Wonda Olds, MD;  Location: [place];  Service: Open Heart Surgery;  Laterality: N/A;   BALLOON AORTIC VALVE VALVULOPLASTY N/A 10/18/2020   Procedure: BALLOON AORTIC VALVE VALVULOPLASTY;  Surgeon: Sherren Mocha, MD;  Location: [place];  Service: Cardiovascular;  Laterality: N/A;   BIV PACEMAKER INSERTION CRT-P N/A 10/20/2020   Procedure: BIV PACEMAKER INSERTION CRT-P;  Surgeon: Vickie Epley, MD;  Location: [place];  Service: Cardiovascular;  Laterality: N/A;   CARDIAC CATHETERIZATION  CATARACT EXTRACTION     CORONARY ARTERY BYPASS GRAFT N/A 03/06/2019   Procedure: CORONARY ARTERY BYPASS GRAFTING (CABG), ON PUMP, TIMES THREE, USING BILATERAL INTERNAL MAMMARIES AND LEFT RADIAL ARTERY HARVEST;  Surgeon: Wonda Olds, MD;  Location: [place];  Service: Open Heart Surgery;  Laterality: N/A;  BILATERAL IMA   LEFT HEART CATH AND CORONARY ANGIOGRAPHY N/A 03/04/2019   Procedure: LEFT HEART CATH AND CORONARY ANGIOGRAPHY;  Surgeon: Burnell Blanks, MD;  Location: [facility];  Service: Cardiovascular;  Laterality: N/A;   None     RADIAL ARTERY HARVEST Left 03/06/2019   Procedure: RADIAL ARTERY HARVEST;  Surgeon: Wonda Olds, MD;  Location: [facility];  Service: Open Heart Surgery;  Laterality: Left;   RIGHT HEART CATH AND CORONARY/GRAFT ANGIOGRAPHY N/A 10/10/2020   Procedure: RIGHT HEART CATH AND CORONARY/GRAFT ANGIOGRAPHY;  Surgeon: Nelva Bush, MD;  Location: [facility];  Service: Cardiovascular;  Laterality: N/A;   TEE WITHOUT CARDIOVERSION N/A 03/06/2019   Procedure: TRANSESOPHAGEAL ECHOCARDIOGRAM (TEE);  Surgeon: Wonda Olds, MD;  Location: [facility];  Service: Open Heart Surgery;  Laterality: N/A;   TEE WITHOUT CARDIOVERSION N/A 10/10/2020   Procedure: TRANSESOPHAGEAL ECHOCARDIOGRAM (TEE);  Surgeon: Josue Hector, MD;  Location: [facility];  Service: Cardiovascular;  Laterality: N/A;   TEE WITHOUT CARDIOVERSION N/A 10/18/2020   Procedure: TRANSESOPHAGEAL ECHOCARDIOGRAM (TEE);  Surgeon: Sherren Mocha, MD;  Location: [place];  Service: Open Heart Surgery;  Laterality: N/A;   TEMPORARY PACEMAKER N/A 10/17/2020   Procedure: TEMPORARY PACEMAKER;  Surgeon: Evans Lance, MD;  Location: [place] [facility];  Service: Cardiovascular;  Laterality: N/A;     Current Outpatient Medications  Medication Sig Dispense Refill   acetaminophen (TYLENOL) 500 MG tablet Take 1,000 mg by mouth every 6 (six) hours as needed for moderate pain.     allopurinol (ZYLOPRIM) 100 MG tablet Take 1 tablet (100 mg total) by mouth daily. 30 tablet 6   ALPRAZolam (XANAX) 0.5 MG tablet TAKE ONE TABLET BY MOUTH ONCE DAILY AS NEEDED FOR ANIXETY. TAKE 30 MINUTES PRIOR TO PLANE FLIGHT OR AS NEEDED FOR ANXIETY 20 tablet 0   aspirin EC 81 MG tablet Take 1 tablet (81 mg total) by mouth daily. Swallow whole. 30 tablet 11   atorvastatin (LIPITOR) 80 MG tablet Take 1 tablet (80 mg total) by mouth daily. 90 tablet 3   colchicine 0.6 MG tablet Take 2 tablets by mouth once daily 60  tablet 5   furosemide (LASIX) 20 MG tablet Take 2 tablets in the morning and 1 tablet in the afternoon 90 tablet 3   ketoconazole (NIZORAL) 2 % shampoo Apply 1 application topically 2 (two) times a week. 120 mL 1   latanoprost (XALATAN) 0.005 % ophthalmic solution Place 1 drop into both eyes every morning.      Misc Natural Products (OSTEO BI-FLEX/5-LOXIN ADVANCED) TABS Take 2 tablets by mouth daily.     potassium chloride SA (KLOR-CON) 20 MEQ tablet Take 1 tablet (20 mEq total) by mouth daily. 90 tablet 2   sacubitril-valsartan (ENTRESTO) 49-51 MG Take 1 tablet by mouth 2 (two) times daily. 60 tablet 5   sertraline (ZOLOFT) 50 MG tablet Take 1 tablet (50 mg total) by mouth daily. 90 tablet 3   spironolactone (ALDACTONE) 25 MG tablet Take 1 tablet (25 mg total) by mouth daily. 90 tablet 3   traMADol (ULTRAM) 50 MG tablet TAKE 1 TABLET BY MOUTH EVERY 4 HOURS AS NEEDED FOR PAIN  30 tablet 0   traZODone (DESYREL) 50 MG tablet Take 0.5-1 tablets (25-50 mg total) by mouth at bedtime as needed for sleep. (Patient taking differently: Take 50 mg by mouth at bedtime as needed for sleep.) 30 tablet 3   metoprolol succinate (TOPROL-XL) 25 MG 24 hr tablet Take 1 tablet (25 mg total) by mouth 2 (two) times daily. 180 tablet 3   No current facility-administered medications for this visit.    Allergies:   Heparin    ROS:  Please see the history of present illness.   Otherwise, review of systems are positive for none.   All other systems are reviewed and negative.    PHYSICAL EXAM: VS:  BP 124/73    Pulse 70    Ht 5\' 6"  (1.676 m)    Wt 209 lb 12.8 oz (95.2 kg)    SpO2 94%    BMI 33.86 kg/m  , BMI Body mass index is 33.86 kg/m.  GENERAL:  Well appearing NECK:  No jugular venous distention, waveform within normal limits, carotid upstroke brisk and symmetric, no bruits, no thyromegaly LUNGS:  Clear to auscultation bilaterally CHEST:  Well healed sternotomy scar.  Well-healed pacemaker pocket HEART:  PMI  not displaced or sustained,S1 and S2 within normal limits, no S3, no S4, no clicks, no rubs, 2 out of 6 brief apical systolic murmur radiating slightly at the aortic outflow tract, no diastolic murmurs ABD:  Flat, positive bowel sounds normal in frequency in pitch, no bruits, no rebound, no guarding, no midline pulsatile mass, no hepatomegaly, no splenomegaly EXT:  2 plus pulses throughout, no edema, no cyanosis no clubbing  EKG:  EKG is ordered today. Atrial ventricular paced rhythm 100% capture   Recent Labs: 09/28/2020: B Natriuretic Peptide 2,498.6; TSH 1.620 10/20/2020: Magnesium 1.7 11/17/2020: ALT 16; BUN 15; Creatinine 0.83; Hemoglobin 9.6; Platelet Count 142; Potassium 4.1; Sodium 140    Lipid Panel    Component Value Date/Time   CHOL 151 03/21/2020 0805   CHOL 132 08/07/2019 0811   TRIG 98.0 03/21/2020 0805   TRIG 162 (H) 10/25/2005 1507   HDL 67.70 03/21/2020 0805   HDL 61 08/07/2019 0811   CHOLHDL 2 03/21/2020 0805   VLDL 19.6 03/21/2020 0805   LDLCALC 63 03/21/2020 0805   LDLCALC 52 08/07/2019 0811   LDLDIRECT 151.0 01/05/2016 0748      Wt Readings from Last 3 Encounters:  01/05/21 209 lb 12.8 oz (95.2 kg)  01/05/21 218 lb 6 oz (99.1 kg)  12/20/20 205 lb (93 kg)      Other studies Reviewed: Additional studies/ records that were reviewed today include:   Echo and labs Review of the above records demonstrates:  Please see elsewhere in the note.     ASSESSMENT AND PLAN:  CAD: S/P CABG.   he has no anginal symptoms.  We will continue with risk reduction.   HIT:   Heparin is listed as an allergy.   S/P Bioprosthetic AoV replacement:   He is to have follow up echo in April or May.  I  Hyperlipidemia:   LDL was 63.  No change in therapy.   Cardiomyopathy: EF was 45 - 50%.  I am going to slowly titrate up on his medications by increasing his beta-blocker to 25 mg twice daily.  Neck step probably would be to increase the Entresto.  We will follow-up with an  echo in the future as above.  He seems to have class I symptoms at this point.  HTN:  This is being managed in the context of treating his CHF.    CRTD:   He is up-to-date with follow-up.  Preop: The patient wants to have his knee surgeries finally.  He would be acceptable risk for planned procedure without further cardiovascular testing.  Current medicines are reviewed at length with the patient today.  The patient does not have concerns regarding medicines.  The following changes have been made:   As above Labs/ tests ordered today include: None  Orders Placed This Encounter  Procedures   EKG 12-Lead      Disposition:   FU with me in 3 months   Signed, [name], MD  01/05/2021 4:33 PM    [facility]

## 2021-01-05 ENCOUNTER — Other Ambulatory Visit: Payer: Self-pay

## 2021-01-05 ENCOUNTER — Encounter: Payer: Self-pay | Admitting: Family Medicine

## 2021-01-05 ENCOUNTER — Ambulatory Visit (INDEPENDENT_AMBULATORY_CARE_PROVIDER_SITE_OTHER): Payer: Medicare HMO | Admitting: Family Medicine

## 2021-01-05 ENCOUNTER — Encounter: Payer: Self-pay | Admitting: Cardiology

## 2021-01-05 ENCOUNTER — Ambulatory Visit: Payer: Medicare HMO | Admitting: Cardiology

## 2021-01-05 VITALS — BP 124/73 | HR 70 | Ht 66.0 in | Wt 209.8 lb

## 2021-01-05 VITALS — BP 110/60 | HR 69 | Temp 98.1°F | Ht 67.0 in | Wt 218.4 lb

## 2021-01-05 DIAGNOSIS — I480 Paroxysmal atrial fibrillation: Secondary | ICD-10-CM | POA: Diagnosis not present

## 2021-01-05 DIAGNOSIS — I2510 Atherosclerotic heart disease of native coronary artery without angina pectoris: Secondary | ICD-10-CM

## 2021-01-05 DIAGNOSIS — Z95810 Presence of automatic (implantable) cardiac defibrillator: Secondary | ICD-10-CM

## 2021-01-05 DIAGNOSIS — I5022 Chronic systolic (congestive) heart failure: Secondary | ICD-10-CM | POA: Diagnosis not present

## 2021-01-05 DIAGNOSIS — I10 Essential (primary) hypertension: Secondary | ICD-10-CM

## 2021-01-05 DIAGNOSIS — Z862 Personal history of diseases of the blood and blood-forming organs and certain disorders involving the immune mechanism: Secondary | ICD-10-CM

## 2021-01-05 DIAGNOSIS — R7303 Prediabetes: Secondary | ICD-10-CM | POA: Diagnosis not present

## 2021-01-05 DIAGNOSIS — Z01818 Encounter for other preprocedural examination: Secondary | ICD-10-CM

## 2021-01-05 DIAGNOSIS — D509 Iron deficiency anemia, unspecified: Secondary | ICD-10-CM

## 2021-01-05 DIAGNOSIS — I420 Dilated cardiomyopathy: Secondary | ICD-10-CM | POA: Diagnosis not present

## 2021-01-05 DIAGNOSIS — Z23 Encounter for immunization: Secondary | ICD-10-CM

## 2021-01-05 DIAGNOSIS — E785 Hyperlipidemia, unspecified: Secondary | ICD-10-CM

## 2021-01-05 DIAGNOSIS — Z952 Presence of prosthetic heart valve: Secondary | ICD-10-CM | POA: Diagnosis not present

## 2021-01-05 MED ORDER — METOPROLOL SUCCINATE ER 25 MG PO TB24: 25.0000 mg | ORAL_TABLET | Freq: Two times a day (BID) | ORAL | 3 refills | Status: DC

## 2021-01-05 NOTE — Assessment & Plan Note (Signed)
Moderate high risk  For orthopedic procedure given anemia Hg 9.6, hx of HIT and cardiomyopathy, recent valvular repair.  Needs cardiac eval preop. Needs pre op labs including Hg prior to surgery. Low risk respiratory evaluation.  No diabetes.  Albumin and nutritional level  Adequate.  He does snore and small oropharynx.. sleep apnea possible and possible difficult intubation, but has not been told of problems  intubating past.

## 2021-01-05 NOTE — Patient Instructions (Signed)
We will fax the form to [facility].  Call if any changes in status prior to surgery.

## 2021-01-05 NOTE — Assessment & Plan Note (Addendum)
Nees re-eval prior to surgery.  Follow closely post op.  Has scheduled repeat labs with Dr. Lindi Adie hematology in 02/2020.

## 2021-01-05 NOTE — Assessment & Plan Note (Signed)
Stable, chronic.  Continue current medication.    

## 2021-01-05 NOTE — Progress Notes (Signed)
Patient ID: Gregory [name], male    DOB: November 06, 1950, 71 y.o.   MRN: [id number]  This visit was conducted in person.  BP 110/60    Pulse 69    Temp 98.1 F (36.7 C) (Temporal)    Ht 5\' 7"  (1.702 m)    Wt 218 lb 6 oz (99.1 kg)    SpO2 97%    BMI 34.20 kg/m    CC:  Chief Complaint  Patient presents with   Pre-op Exam    Subjective:   HPI: Gregory [name] is a 71 y.o. male with  HTN, atrial fibrillation, CAD, aortic insufficiency s/p aortic valve replacement complicated by severe paravalvular regurgitation  treated with ballon valvulopasty, complete heart block with ICD and pacemaker, anemia and history of HIT presenting on 01/05/2021 for Pre-op Exam  He has planned  left knee arthroplasty  Dr. Lyla Glassing.. not scheduled at this point but plans end of Feb.   He is high risk cardio vascularly.. needs cardiology risk stratification prior to upcoming surgery. Reviewed OV from 11/15/2020 Dr. Burt Knack... this was a follow up.  LVEF was improved at last check 11/15/... plan repeat in 6 months.   Dr. Mertie Clause NP.[name] note 12/28: " Chart reviewed as part of pre-operative protocol coverage. Given past medical history and time since last visit, based on ACC/AHA guidelines, Gregory [name] would be at acceptable risk for the planned procedure without further cardiovascular testing.   RCRI indicates class III risk, 6.6% risk of major cardiac event. Patient reports METS>4.   Patient was advised that if he develops new symptoms prior to surgery to contact our office to arrange a follow-up appointment.  He verbalized understanding.  Patient takes Aspirin 81 mg daily. Ideally, patient will continue Aspirin 81 mg daily through the perioperative period. However, if Aspirin needs to be held prior to surgery, please contact our office. "  Per pt he has appt with Dr.  Percival Spanish  this afternoon.  Hypertension:   Good control on current regimen. BP Readings from Last 3 Encounters:  01/05/21 110/60   11/15/20 (!) 92/58  11/02/20 90/60  Using medication without problems or lightheadedness:  none Chest pain with exertion: none Edema: controlled with compression hose, no change Short of breath: minimal Average home BPs: Other issues: good energy overall.  . Anemia: Evaluated and followed by Dr. Lindi Adie ( reviewed last OV note 11/2020)   11/17/2020 Hg 9.6  Total iron  panel normal, ferritin nml at 158. CMET normal except glucose 116 and Ca 8.7, AST 49 on 11/17/2020   Hx of HIT after NSTEMI/CABG in 2021  Relevant past medical, surgical, family and social history reviewed and updated as indicated. Interim medical history since our last visit reviewed. Allergies and medications reviewed and updated. Outpatient Medications Prior to Visit  Medication Sig Dispense Refill   acetaminophen (TYLENOL) 500 MG tablet Take 1,000 mg by mouth every 6 (six) hours as needed for moderate pain.     allopurinol (ZYLOPRIM) 100 MG tablet Take 1 tablet (100 mg total) by mouth daily. 30 tablet 6   ALPRAZolam (XANAX) 0.5 MG tablet TAKE ONE TABLET BY MOUTH ONCE DAILY AS NEEDED FOR ANIXETY. TAKE 30 MINUTES PRIOR TO PLANE FLIGHT OR AS NEEDED FOR ANXIETY 20 tablet 0   aspirin EC 81 MG tablet Take 1 tablet (81 mg total) by mouth daily. Swallow whole. 30 tablet 11   atorvastatin (LIPITOR) 80 MG tablet Take 1 tablet (80 mg total) by mouth daily.  90 tablet 3   colchicine 0.6 MG tablet Take 2 tablets by mouth once daily 60 tablet 5   furosemide (LASIX) 20 MG tablet Take 2 tablets in the morning and 1 tablet in the afternoon 90 tablet 3   ketoconazole (NIZORAL) 2 % shampoo Apply 1 application topically 2 (two) times a week. 120 mL 1   latanoprost (XALATAN) 0.005 % ophthalmic solution Place 1 drop into both eyes every morning.      metoprolol succinate (TOPROL-XL) 25 MG 24 hr tablet Take 0.5 tablets (12.5 mg total) by mouth daily. 45 tablet 3   Misc Natural Products (OSTEO BI-FLEX/5-LOXIN ADVANCED) TABS Take 2 tablets by  mouth daily.     potassium chloride SA (KLOR-CON) 20 MEQ tablet Take 1 tablet (20 mEq total) by mouth daily. 90 tablet 2   sacubitril-valsartan (ENTRESTO) 49-51 MG Take 1 tablet by mouth 2 (two) times daily. 60 tablet 5   sertraline (ZOLOFT) 50 MG tablet Take 1 tablet (50 mg total) by mouth daily. 90 tablet 3   spironolactone (ALDACTONE) 25 MG tablet Take 1 tablet (25 mg total) by mouth daily. 90 tablet 3   traMADol (ULTRAM) 50 MG tablet TAKE 1 TABLET BY MOUTH EVERY 4 HOURS AS NEEDED FOR PAIN 30 tablet 0   traZODone (DESYREL) 50 MG tablet Take 0.5-1 tablets (25-50 mg total) by mouth at bedtime as needed for sleep. (Patient taking differently: Take 50 mg by mouth at bedtime as needed for sleep.) 30 tablet 3   No facility-administered medications prior to visit.     Per HPI unless specifically indicated in ROS section below Review of Systems  Constitutional:  Negative for fatigue and fever.  HENT:  Negative for ear pain.   Eyes:  Negative for pain.  Respiratory:  Negative for cough and shortness of breath.   Cardiovascular:  Negative for chest pain, palpitations and leg swelling.  Gastrointestinal:  Negative for abdominal pain.  Genitourinary:  Negative for dysuria.  Musculoskeletal:  Negative for arthralgias.  Neurological:  Negative for syncope, light-headedness and headaches.  Psychiatric/Behavioral:  Negative for dysphoric mood.   Objective:  BP 110/60    Pulse 69    Temp 98.1 F (36.7 C) (Temporal)    Ht 5\' 7"  (1.702 m)    Wt 218 lb 6 oz (99.1 kg)    SpO2 97%    BMI 34.20 kg/m   Wt Readings from Last 3 Encounters:  01/05/21 218 lb 6 oz (99.1 kg)  12/20/20 205 lb (93 kg)  11/15/20 210 lb (95.3 kg)      Physical Exam Constitutional:      Appearance: He is well-developed.  HENT:     Head: Normocephalic.     Right Ear: Hearing normal.     Left Ear: Hearing normal.     Nose: Nose normal.  Neck:     Thyroid: No thyroid mass or thyromegaly.     Vascular: No carotid bruit.      Trachea: Trachea normal.  Cardiovascular:     Rate and Rhythm: Normal rate and regular rhythm.     Pulses: Normal pulses.     Heart sounds: Heart sounds not distant. Murmur heard.  Systolic murmur is present.    No friction rub. No gallop.     Comments: Wearing compression hose bilaterally Pulmonary:     Effort: Pulmonary effort is normal. No respiratory distress.     Breath sounds: Normal breath sounds.  Musculoskeletal:     Right lower leg: No edema.  Left lower leg: No edema.  Skin:    General: Skin is warm and dry.     Findings: No rash.  Psychiatric:        Speech: Speech normal.        Behavior: Behavior normal.        Thought Content: Thought content normal.      Results for orders placed or performed in visit on 11/17/20  Ferritin  Result Value Ref Range   Ferritin 158 24 - 336 ng/mL  Iron and TIBC  Result Value Ref Range   Iron 92 42 - 163 ug/dL   TIBC 298 202 - 409 ug/dL   Saturation Ratios 31 20 - 55 %   UIBC 206 117 - 376 ug/dL  CMP (Cancer Center only)  Result Value Ref Range   Sodium 140 135 - 145 mmol/L   Potassium 4.1 3.5 - 5.1 mmol/L   Chloride 104 98 - 111 mmol/L   CO2 26 22 - 32 mmol/L   Glucose, Bld 113 (H) 70 - 99 mg/dL   BUN 15 8 - 23 mg/dL   Creatinine 0.83 0.61 - 1.24 mg/dL   Calcium 8.7 (L) 8.9 - 10.3 mg/dL   Total Protein 6.6 6.5 - 8.1 g/dL   Albumin 4.1 3.5 - 5.0 g/dL   AST 49 (H) 15 - 41 U/L   ALT 16 0 - 44 U/L   Alkaline Phosphatase 47 38 - 126 U/L   Total Bilirubin 1.3 (H) 0.3 - 1.2 mg/dL   GFR, Estimated >60 >60 mL/min   Anion gap 10 5 - 15  CBC with Differential (Cancer Center Only)  Result Value Ref Range   WBC Count 3.3 (L) 4.0 - 10.5 K/uL   RBC 2.96 (L) 4.22 - 5.81 MIL/uL   Hemoglobin 9.6 (L) 13.0 - 17.0 g/dL   HCT 29.5 (L) 39.0 - 52.0 %   MCV 99.7 80.0 - 100.0 fL   MCH 32.4 26.0 - 34.0 pg   MCHC 32.5 30.0 - 36.0 g/dL   RDW 19.1 (H) 11.5 - 15.5 %   Platelet Count 142 (L) 150 - 400 K/uL   nRBC 0.0 0.0 - 0.2 %    Neutrophils Relative % 57 %   Neutro Abs 1.9 1.7 - 7.7 K/uL   Lymphocytes Relative 31 %   Lymphs Abs 1.0 0.7 - 4.0 K/uL   Monocytes Relative 9 %   Monocytes Absolute 0.3 0.1 - 1.0 K/uL   Eosinophils Relative 3 %   Eosinophils Absolute 0.1 0.0 - 0.5 K/uL   Basophils Relative 0 %   Basophils Absolute 0.0 0.0 - 0.1 K/uL   Immature Granulocytes 0 %   Abs Immature Granulocytes 0.00 0.00 - 0.07 K/uL  Sample to Blood Bank  Result Value Ref Range   Blood Bank Specimen SAMPLE AVAILABLE FOR TESTING    Sample Expiration      11/20/2020,2359 Performed at [facility], [place] [address], [place], [place] [address]     This visit occurred during the SARS-CoV-2 public health emergency.  Safety protocols were in place, including screening questions prior to the visit, additional usage of staff PPE, and extensive cleaning of exam room while observing appropriate contact time as indicated for disinfecting solutions.   COVID 19 screen:  No recent travel or known exposure to COVID19 The patient denies respiratory symptoms of COVID 19 at this time. The importance of social distancing was discussed today.   Assessment and Plan    Problem List  Items Addressed This Visit     Essential hypertension (Chronic)    Stable, chronic.  Continue current medication.         Prediabetes (Chronic)    Resolved at last check. A1C 4.5 on 10/18/2020      Atrial fibrillation ([place])   Dilated cardiomyopathy ([place])    Asymptomatic today.  Last ECHO:  45-50% EF  Pt has appt for cardiac risk stratification with Dr. Percival Spanish today.      History of heparin-induced thrombocytopenia    > 3 month ago... consider non-heparin anticoagulants,  follow hospital protocol.      ICD (implantable cardioverter-defibrillator) in place   Iron deficiency anemia    Nees re-eval prior to surgery.  Follow closely post op.  Has scheduled repeat labs with Dr. Lindi Adie hematology in 02/2020.      Pre-op  evaluation - Primary    Moderate high risk  For orthopedic procedure given anemia Hg 9.6, hx of HIT and cardiomyopathy, recent valvular repair.  Needs cardiac eval preop. Needs pre op labs including Hg prior to surgery. Low risk respiratory evaluation.  No diabetes.  Albumin and nutritional level  Adequate.  He does snore and small oropharynx.. sleep apnea possible and possible difficult intubation, but has not been told of problems  intubating past.      Other Visit Diagnoses     Need for influenza vaccination       Relevant Orders   Flu Vaccine QUAD High Dose(Fluad) (Completed)      Form completed and faxed to [facility].  [name], MD

## 2021-01-05 NOTE — Assessment & Plan Note (Signed)
Resolved at last check. A1C 4.5 on 10/18/2020

## 2021-01-05 NOTE — Assessment & Plan Note (Addendum)
>   3 month ago... consider non-heparin anticoagulants,  follow hospital protocol.

## 2021-01-05 NOTE — Assessment & Plan Note (Signed)
Asymptomatic today.  Last ECHO:  45-50% EF  Pt has appt for cardiac risk stratification with Dr. Percival Spanish today.

## 2021-01-05 NOTE — Patient Instructions (Signed)
Medication Instructions:  Increase metoprolol succinate to 25 mg twice a day *If you need a refill on your cardiac medications before your next appointment, please call your pharmacy*  Follow-Up: At [facility], you and your health needs are our priority.  As part of our continuing mission to provide you with exceptional heart care, we have created designated Provider Care Teams.  These Care Teams include your primary Cardiologist (physician) and Advanced Practice Providers (APPs -  Physician Assistants and Nurse Practitioners) who all work together to provide you with the care you need, when you need it.  We recommend signing up for the patient portal called "MyChart".  Sign up information is provided on this After Visit Summary.  MyChart is used to connect with patients for Virtual Visits (Telemedicine).  Patients are able to view lab/test results, encounter notes, upcoming appointments, etc.  Non-urgent messages can be sent to your provider as well.   To learn more about what you can do with MyChart, go to [email or web address].    Your next appointment:   3 month(s)  The format for your next appointment:   In Person  Provider:   [name], MD

## 2021-01-07 ENCOUNTER — Other Ambulatory Visit: Payer: Self-pay | Admitting: Family Medicine

## 2021-01-09 ENCOUNTER — Telehealth: Payer: Self-pay | Admitting: *Deleted

## 2021-01-09 NOTE — Telephone Encounter (Signed)
Received VM from pt wife.  RN attempt x1 to return call.  No answer, LVM to return call to the office.

## 2021-01-13 ENCOUNTER — Inpatient Hospital Stay: Payer: Medicare HMO | Attending: Hematology and Oncology

## 2021-01-13 ENCOUNTER — Other Ambulatory Visit: Payer: Self-pay

## 2021-01-13 DIAGNOSIS — D7589 Other specified diseases of blood and blood-forming organs: Secondary | ICD-10-CM | POA: Insufficient documentation

## 2021-01-13 DIAGNOSIS — Z7901 Long term (current) use of anticoagulants: Secondary | ICD-10-CM | POA: Diagnosis not present

## 2021-01-13 DIAGNOSIS — D509 Iron deficiency anemia, unspecified: Secondary | ICD-10-CM

## 2021-01-13 DIAGNOSIS — E538 Deficiency of other specified B group vitamins: Secondary | ICD-10-CM | POA: Insufficient documentation

## 2021-01-13 DIAGNOSIS — Z862 Personal history of diseases of the blood and blood-forming organs and certain disorders involving the immune mechanism: Secondary | ICD-10-CM | POA: Diagnosis not present

## 2021-01-13 LAB — CBC WITH DIFFERENTIAL (CANCER CENTER ONLY)
Abs Immature Granulocytes: 0.01 10*3/uL (ref 0.00–0.07)
Basophils Absolute: 0 10*3/uL (ref 0.0–0.1)
Basophils Relative: 0 %
Eosinophils Absolute: 0.1 10*3/uL (ref 0.0–0.5)
Eosinophils Relative: 2 %
HCT: 30 % — ABNORMAL LOW (ref 39.0–52.0)
Hemoglobin: 9.9 g/dL — ABNORMAL LOW (ref 13.0–17.0)
Immature Granulocytes: 0 %
Lymphocytes Relative: 27 %
Lymphs Abs: 1.5 10*3/uL (ref 0.7–4.0)
MCH: 34.9 pg — ABNORMAL HIGH (ref 26.0–34.0)
MCHC: 33 g/dL (ref 30.0–36.0)
MCV: 105.6 fL — ABNORMAL HIGH (ref 80.0–100.0)
Monocytes Absolute: 0.4 10*3/uL (ref 0.1–1.0)
Monocytes Relative: 8 %
Neutro Abs: 3.4 10*3/uL (ref 1.7–7.7)
Neutrophils Relative %: 63 %
Platelet Count: 165 10*3/uL (ref 150–400)
RBC: 2.84 MIL/uL — ABNORMAL LOW (ref 4.22–5.81)
RDW: 14.6 % (ref 11.5–15.5)
WBC Count: 5.5 10*3/uL (ref 4.0–10.5)
nRBC: 0 % (ref 0.0–0.2)

## 2021-01-13 LAB — CMP (CANCER CENTER ONLY)
ALT: 17 U/L (ref 0–44)
AST: 50 U/L — ABNORMAL HIGH (ref 15–41)
Albumin: 4.2 g/dL (ref 3.5–5.0)
Alkaline Phosphatase: 40 U/L (ref 38–126)
Anion gap: 9 (ref 5–15)
BUN: 19 mg/dL (ref 8–23)
CO2: 25 mmol/L (ref 22–32)
Calcium: 8.8 mg/dL — ABNORMAL LOW (ref 8.9–10.3)
Chloride: 105 mmol/L (ref 98–111)
Creatinine: 0.83 mg/dL (ref 0.61–1.24)
GFR, Estimated: >60 mL/min (ref >60)
Glucose, Bld: 87 mg/dL (ref 70–99)
Potassium: 4 mmol/L (ref 3.5–5.1)
Sodium: 139 mmol/L (ref 135–145)
Total Bilirubin: 1.3 mg/dL — ABNORMAL HIGH (ref 0.3–1.2)
Total Protein: 6.5 g/dL (ref 6.5–8.1)

## 2021-01-13 LAB — IRON AND IRON BINDING CAPACITY (CC-WL,HP ONLY)
Iron: 90 ug/dL (ref 45–182)
Saturation Ratios: 25 % (ref 17.9–39.5)
TIBC: 357 ug/dL (ref 250–450)
UIBC: 267 ug/dL (ref 117–376)

## 2021-01-13 LAB — FERRITIN: Ferritin: 50 ng/mL (ref 24–336)

## 2021-01-13 NOTE — Progress Notes (Signed)
Patient Care Team: Jinny Sanders, MD as PCP - General (Family Medicine) [name], MD as PCP - Cardiology (Cardiology) Lendon Colonel, NP as Nurse Practitioner (Cardiology) Debbora Dus, [facility] as Pharmacist (Pharmacist)  DIAGNOSIS:    ICD-10-CM   1. Macrocytic anemia  D53.9 Vitamin B12    Retic Panel    CBC with Differential (Cancer Center Only)    2. History of heparin-induced thrombocytopenia  Z86.2       CHIEF COMPLIANT: Follow-up of heparin-induced thrombocytopenia and chronic macrocytic anemia  INTERVAL HISTORY: Gregory [name] is a 71 y.o. with above-mentioned history of Heparin-induced thrombocytopenia who is currently on anticoagulation with Coumadin. Labs on 01/13/2021 showed WBC 5.5, Hgb 9.9, and HCT 30.0. He presents to the clinic today for follow-up.  His knee surgery has been postponed because of anemia.  Apparently he needs a hemoglobin of 11 to qualify for knee surgery.  Over the past several months because of worsening pain he has been drinking 6 beers per day.  Over the past few months his MCV has also markedly increased.  His recent lab works did not show any evidence of iron deficiency but he does have a new macrocytic anemia and it needs to be worked up.  ALLERGIES:  is allergic to heparin.  MEDICATIONS:  Current Outpatient Medications  Medication Sig Dispense Refill   acetaminophen (TYLENOL) 500 MG tablet Take 1,000 mg by mouth every 6 (six) hours as needed for moderate pain.     allopurinol (ZYLOPRIM) 100 MG tablet Take 1 tablet (100 mg total) by mouth daily. 30 tablet 6   ALPRAZolam (XANAX) 0.5 MG tablet TAKE ONE TABLET BY MOUTH ONCE DAILY AS NEEDED FOR ANIXETY. TAKE 30 MINUTES PRIOR TO PLANE FLIGHT OR AS NEEDED FOR ANXIETY 20 tablet 0   aspirin EC 81 MG tablet Take 1 tablet (81 mg total) by mouth daily. Swallow whole. 30 tablet 11   atorvastatin (LIPITOR) 80 MG tablet Take 1 tablet by mouth once daily 90 tablet 0   colchicine 0.6 MG tablet Take  2 tablets by mouth once daily 60 tablet 5   furosemide (LASIX) 20 MG tablet Take 2 tablets in the morning and 1 tablet in the afternoon 90 tablet 3   ketoconazole (NIZORAL) 2 % shampoo Apply 1 application topically 2 (two) times a week. 120 mL 1   latanoprost (XALATAN) 0.005 % ophthalmic solution Place 1 drop into both eyes every morning.      metoprolol succinate (TOPROL-XL) 25 MG 24 hr tablet Take 1 tablet (25 mg total) by mouth 2 (two) times daily. 180 tablet 3   Misc Natural Products (OSTEO BI-FLEX/5-LOXIN ADVANCED) TABS Take 2 tablets by mouth daily.     potassium chloride SA (KLOR-CON) 20 MEQ tablet Take 1 tablet (20 mEq total) by mouth daily. 90 tablet 2   sacubitril-valsartan (ENTRESTO) 49-51 MG Take 1 tablet by mouth 2 (two) times daily. 60 tablet 5   sertraline (ZOLOFT) 50 MG tablet Take 1 tablet by mouth once daily 90 tablet 0   spironolactone (ALDACTONE) 25 MG tablet Take 1 tablet (25 mg total) by mouth daily. 90 tablet 3   traMADol (ULTRAM) 50 MG tablet TAKE 1 TABLET BY MOUTH EVERY 4 HOURS AS NEEDED FOR PAIN 30 tablet 0   traZODone (DESYREL) 50 MG tablet Take 0.5-1 tablets (25-50 mg total) by mouth at bedtime as needed for sleep. (Patient taking differently: Take 50 mg by mouth at bedtime as needed for sleep.) 30 tablet 3  No current facility-administered medications for this visit.    PHYSICAL EXAMINATION: ECOG PERFORMANCE STATUS: 1 - Symptomatic but completely ambulatory  Vitals:   01/16/21 1404  BP: (!) 123/58  Pulse: 73  Resp: 18  Temp: 97.7 F (36.5 C)  SpO2: 96%   Filed Weights   01/16/21 1404  Weight: 222 lb 8 oz (100.9 kg)    LABORATORY DATA:  I have reviewed the data as listed CMP Latest Ref Rng & Units 01/13/2021 11/17/2020 11/02/2020  Glucose 70 - 99 mg/dL 87 113(H) 94  BUN 8 - 23 mg/dL 19 15 23   Creatinine 0.61 - 1.24 mg/dL 0.83 0.83 1.25  Sodium 135 - 145 mmol/L 139 140 141  Potassium 3.5 - 5.1 mmol/L 4.0 4.1 5.2  Chloride 98 - 111 mmol/L 105 104 103   CO2 22 - 32 mmol/L 25 26 22   Calcium 8.9 - 10.3 mg/dL 8.8(L) 8.7(L) 9.5  Total Protein 6.5 - 8.1 g/dL 6.5 6.6 -  Total Bilirubin 0.3 - 1.2 mg/dL 1.3(H) 1.3(H) -  Alkaline Phos 38 - 126 U/L 40 47 -  AST 15 - 41 U/L 50(H) 49(H) -  ALT 0 - 44 U/L 17 16 -    Lab Results  Component Value Date   WBC 4.9 01/16/2021   HGB 9.2 (L) 01/16/2021   HCT 27.0 (L) 01/16/2021   MCV 105.1 (H) 01/16/2021   PLT 165 01/16/2021   NEUTROABS 2.7 01/16/2021    ASSESSMENT & PLAN:  History of heparin-induced thrombocytopenia Work-up: TSH: 2.56: Normal CMP: AST 43, ALT 24, bilirubin 1.9, LDH 1004 (direct Coombs test negative) SPEP: No M protein Reticulocyte count: 3.8%, absolute reticulocyte count 122, reticulocyte hemoglobin: 23.9 (suggestive of iron deficiency or ineffective erythropoiesis) U04: 540, folic acid 98.1 Erythropoietin: 235.3 09/16/2020: Iron studies revealed ferritin of 14 and iron saturation of 5.3% (IV iron treatment)   Elevated LDH with normal direct Coombs test: Concern for intravascular hemolysis possibly from severe valvular heart disease  11/17/2020: Hemoglobin 9.6, WBC 3.3, platelets 142, ANC 1.9, ferritin 158, iron saturation 31% (improv from 5%) 01/13/21: Hb 9.9, WBC: 5.5, MCV 105.6, Platelet 165, TB 1.3, Ferritin 50, Iron Sat: 25%,  01/16/2021: Hemoglobin 9.2, MCV 105.1, B12 pending  Knee replacement surgery: Patient needs a hemoglobin of 11. His previous erythropoietin levels were markedly elevated and therefore he may not respond to erythropoietin replacement therapy. We will check his B12 levels because of new onset macrocytosis.  The other possibility is alcoholism.  The third possibility is myelodysplastic syndrome.    His B12 levels are 117 and therefore he is profoundly B12 deficient. Plan: B12 injections twice a week x4 followed by weekly x4 followed by monthly Recheck labs and follow-up in 1 month    Orders Placed This Encounter  Procedures   Vitamin B12     Standing Status:   Future    Number of Occurrences:   1    Standing Expiration Date:   01/16/2022   Retic Panel    Standing Status:   Future    Number of Occurrences:   1    Standing Expiration Date:   01/16/2022   CBC with Differential (Cancer Center Only)    Standing Status:   Future    Number of Occurrences:   1    Standing Expiration Date:   01/16/2022   The patient has a good understanding of the overall plan. he agrees with it. he will call with any problems that may develop before the next visit here.  Total time spent: 30 mins including face to face time and time spent for planning, charting and coordination of care  Rulon Eisenmenger, MD, MPH 01/16/2021  I, Thana Ates, am acting as scribe for Dr. Nicholas Lose.  I have reviewed the above documentation for accuracy and completeness, and I agree with the above.

## 2021-01-15 NOTE — Assessment & Plan Note (Signed)
Work-up: TSH: 2.56: Normal CMP: AST 43, ALT 24, bilirubin 1.9, LDH 1004 (direct Coombs test negative) SPEP: No M protein Reticulocyte count: 3.8%, absolute reticulocyte count 122, reticulocyte hemoglobin: 23.9 (suggestive of iron deficiency or ineffective erythropoiesis) A07: 622, folic acid 63.3 Erythropoietin: 235.3 09/16/2020: Iron studies revealed ferritin of 14 and iron saturation of 5.3% (IV iron treatment)  Elevated LDH with normal direct Coombs test: Concern for intravascular hemolysis possibly from severe valvular heart disease  11/17/2020: Hemoglobin 9.6, WBC 3.3, platelets 142, ANC 1.9, ferritin 158, iron saturation 31% (improv from 5%) 01/13/21: Hb 9.9, WBC: 5.5, MCV 105.6, Platelet 165, TB 1.3, Ferritin 50, Iron Sat: 25%,   No indication for blood transfusion.  He may undergo knee replacement surgery sometime next year.  I discussed with him that he might need blood transfusion postoperatively if necessary.  We can help support him through the surgery if needed.  Continue to watch and monitor with recheck labs in 6 months and follow-up after that with a Mychart virtual visit.

## 2021-01-16 ENCOUNTER — Inpatient Hospital Stay: Payer: Medicare HMO

## 2021-01-16 ENCOUNTER — Inpatient Hospital Stay: Payer: Medicare HMO | Admitting: Hematology and Oncology

## 2021-01-16 ENCOUNTER — Other Ambulatory Visit: Payer: Self-pay

## 2021-01-16 VITALS — BP 123/58 | HR 73 | Temp 97.7°F | Resp 18 | Ht 66.0 in | Wt 222.5 lb

## 2021-01-16 DIAGNOSIS — D539 Nutritional anemia, unspecified: Secondary | ICD-10-CM

## 2021-01-16 DIAGNOSIS — Z7901 Long term (current) use of anticoagulants: Secondary | ICD-10-CM | POA: Diagnosis not present

## 2021-01-16 DIAGNOSIS — E538 Deficiency of other specified B group vitamins: Secondary | ICD-10-CM

## 2021-01-16 DIAGNOSIS — Z862 Personal history of diseases of the blood and blood-forming organs and certain disorders involving the immune mechanism: Secondary | ICD-10-CM

## 2021-01-16 DIAGNOSIS — D7589 Other specified diseases of blood and blood-forming organs: Secondary | ICD-10-CM | POA: Diagnosis not present

## 2021-01-16 LAB — VITAMIN B12: Vitamin B-12: 119 pg/mL — ABNORMAL LOW (ref 180–914)

## 2021-01-16 LAB — CBC WITH DIFFERENTIAL (CANCER CENTER ONLY)
Abs Immature Granulocytes: 0.01 10*3/uL (ref 0.00–0.07)
Basophils Absolute: 0 10*3/uL (ref 0.0–0.1)
Basophils Relative: 0 %
Eosinophils Absolute: 0.1 10*3/uL (ref 0.0–0.5)
Eosinophils Relative: 2 %
HCT: 27 % — ABNORMAL LOW (ref 39.0–52.0)
Hemoglobin: 9.2 g/dL — ABNORMAL LOW (ref 13.0–17.0)
Immature Granulocytes: 0 %
Lymphocytes Relative: 32 %
Lymphs Abs: 1.6 10*3/uL (ref 0.7–4.0)
MCH: 35.8 pg — ABNORMAL HIGH (ref 26.0–34.0)
MCHC: 34.1 g/dL (ref 30.0–36.0)
MCV: 105.1 fL — ABNORMAL HIGH (ref 80.0–100.0)
Monocytes Absolute: 0.5 10*3/uL (ref 0.1–1.0)
Monocytes Relative: 10 %
Neutro Abs: 2.7 10*3/uL (ref 1.7–7.7)
Neutrophils Relative %: 56 %
Platelet Count: 165 10*3/uL (ref 150–400)
RBC: 2.57 MIL/uL — ABNORMAL LOW (ref 4.22–5.81)
RDW: 14.7 % (ref 11.5–15.5)
WBC Count: 4.9 10*3/uL (ref 4.0–10.5)
nRBC: 0 % (ref 0.0–0.2)

## 2021-01-16 LAB — RETIC PANEL
Immature Retic Fract: 18.4 % — ABNORMAL HIGH (ref 2.3–15.9)
RBC.: 2.59 MIL/uL — ABNORMAL LOW (ref 4.22–5.81)
Retic Count, Absolute: 123.5 10*3/uL (ref 19.0–186.0)
Retic Ct Pct: 4.8 % — ABNORMAL HIGH (ref 0.4–3.1)
Reticulocyte Hemoglobin: 38.7 pg (ref >27.9)

## 2021-01-17 ENCOUNTER — Telehealth: Payer: Self-pay | Admitting: Hematology and Oncology

## 2021-01-17 NOTE — Telephone Encounter (Signed)
Scheduled per 1/16 los, message has been left with pt

## 2021-01-18 ENCOUNTER — Encounter: Payer: Self-pay | Admitting: Cardiology

## 2021-01-19 ENCOUNTER — Ambulatory Visit (INDEPENDENT_AMBULATORY_CARE_PROVIDER_SITE_OTHER): Payer: Medicare HMO

## 2021-01-19 DIAGNOSIS — I420 Dilated cardiomyopathy: Secondary | ICD-10-CM

## 2021-01-19 LAB — CUP PACEART REMOTE DEVICE CHECK
Battery Remaining Longevity: 70 mo
Battery Remaining Percentage: 94 %
Battery Voltage: 2.99 V
Brady Statistic AP VP Percent: 54 %
Brady Statistic AP VS Percent: 1 %
Brady Statistic AS VP Percent: 46 %
Brady Statistic AS VS Percent: 1 %
Brady Statistic RA Percent Paced: 53 %
Date Time Interrogation Session: 20230119010020
HighPow Impedance: 54 Ohm
Implantable Lead Implant Date: 20221020
Implantable Lead Implant Date: 20221020
Implantable Lead Implant Date: 20221020
Implantable Lead Location: 753858
Implantable Lead Location: 753859
Implantable Lead Location: 753860
Implantable Pulse Generator Implant Date: 20221020
Lead Channel Impedance Value: 400 Ohm
Lead Channel Impedance Value: 400 Ohm
Lead Channel Impedance Value: 650 Ohm
Lead Channel Pacing Threshold Amplitude: 0.5 V
Lead Channel Pacing Threshold Amplitude: 0.75 V
Lead Channel Pacing Threshold Amplitude: 1.5 V
Lead Channel Pacing Threshold Pulse Width: 0.5 ms
Lead Channel Pacing Threshold Pulse Width: 0.5 ms
Lead Channel Pacing Threshold Pulse Width: 0.7 ms
Lead Channel Sensing Intrinsic Amplitude: 12 mV
Lead Channel Sensing Intrinsic Amplitude: 2.3 mV
Lead Channel Setting Pacing Amplitude: 1.5 V
Lead Channel Setting Pacing Amplitude: 2 V
Lead Channel Setting Pacing Amplitude: 3.5 V
Lead Channel Setting Pacing Pulse Width: 0.5 ms
Lead Channel Setting Pacing Pulse Width: 0.7 ms
Lead Channel Setting Sensing Sensitivity: 0.5 mV
Pulse Gen Serial Number: 111052020

## 2021-01-23 ENCOUNTER — Other Ambulatory Visit: Payer: Self-pay | Admitting: Family Medicine

## 2021-01-23 ENCOUNTER — Other Ambulatory Visit: Payer: Self-pay

## 2021-01-23 ENCOUNTER — Inpatient Hospital Stay (HOSPITAL_BASED_OUTPATIENT_CLINIC_OR_DEPARTMENT_OTHER): Payer: Medicare HMO

## 2021-01-23 DIAGNOSIS — E538 Deficiency of other specified B group vitamins: Secondary | ICD-10-CM | POA: Diagnosis not present

## 2021-01-23 DIAGNOSIS — D7589 Other specified diseases of blood and blood-forming organs: Secondary | ICD-10-CM | POA: Diagnosis not present

## 2021-01-23 DIAGNOSIS — Z7901 Long term (current) use of anticoagulants: Secondary | ICD-10-CM | POA: Diagnosis not present

## 2021-01-23 DIAGNOSIS — Z862 Personal history of diseases of the blood and blood-forming organs and certain disorders involving the immune mechanism: Secondary | ICD-10-CM | POA: Diagnosis not present

## 2021-01-23 MED ORDER — CYANOCOBALAMIN 1000 MCG/ML IJ SOLN
1000.0000 ug | Freq: Once | INTRAMUSCULAR | Status: AC
  Administered 2021-01-23: 1000 ug via INTRAMUSCULAR
  Filled 2021-01-23: qty 1

## 2021-01-24 NOTE — Progress Notes (Signed)
Electrophysiology Office Follow up Visit Note:    Date:  01/25/2021   ID:  JOSON SAPP, DOB [birth year], MRN [id number]  PCP:  Jinny Sanders, MD  [facility] Cardiologist:  [name], MD  [facility] Electrophysiologist:  Vickie Epley, MD    Interval History:    Gregory [name] is a 71 y.o. male who presents for a follow up visit. He had a BiV ICD implanted 10/20/2020.  He has done well since implant.       Past Medical History:  Diagnosis Date   Alcohol abuse    Anxiety    Aortic valve disease    Arthritis    Coronary artery disease    Glaucoma    Hypertension    OSA (obstructive sleep apnea)    Pre-diabetes    S/P balloon aortic valvuloplasty 10/18/2020   done for bioprosthetic aortic valve replacement perivalvular leaking reducing it from severe to mild.   Shingles     Past Surgical History:  Procedure Laterality Date   AORTIC ARCH ANGIOGRAPHY N/A 10/10/2020   Procedure: AORTIC ARCH ANGIOGRAPHY;  Surgeon: Nelva Bush, MD;  Location: [place] [facility];  Service: Cardiovascular;  Laterality: N/A;   AORTIC VALVE REPLACEMENT N/A 03/06/2019   Procedure: AORTIC VALVE REPLACEMENT (AVR), USING INTUITY 25MM;  Surgeon: Wonda Olds, MD;  Location: [place];  Service: Open Heart Surgery;  Laterality: N/A;   BALLOON AORTIC VALVE VALVULOPLASTY N/A 10/18/2020   Procedure: BALLOON AORTIC VALVE VALVULOPLASTY;  Surgeon: Sherren Mocha, MD;  Location: [place];  Service: Cardiovascular;  Laterality: N/A;   BIV PACEMAKER INSERTION CRT-P N/A 10/20/2020   Procedure: BIV PACEMAKER INSERTION CRT-P;  Surgeon: Vickie Epley, MD;  Location: [place] [facility];  Service: Cardiovascular;  Laterality: N/A;   CARDIAC CATHETERIZATION     CATARACT EXTRACTION     CORONARY ARTERY BYPASS GRAFT N/A 03/06/2019   Procedure: CORONARY ARTERY BYPASS GRAFTING (CABG), ON PUMP, TIMES THREE, USING BILATERAL INTERNAL MAMMARIES AND LEFT RADIAL ARTERY HARVEST;  Surgeon: Wonda Olds, MD;  Location: [place];  Service: Open Heart Surgery;  Laterality: N/A;  BILATERAL IMA   LEFT HEART CATH AND CORONARY ANGIOGRAPHY N/A 03/04/2019   Procedure: LEFT HEART CATH AND CORONARY ANGIOGRAPHY;  Surgeon: Burnell Blanks, MD;  Location: [place] [facility];  Service: Cardiovascular;  Laterality: N/A;   None     RADIAL ARTERY HARVEST Left 03/06/2019   Procedure: RADIAL ARTERY HARVEST;  Surgeon: Wonda Olds, MD;  Location: [place];  Service: Open Heart Surgery;  Laterality: Left;   RIGHT HEART CATH AND CORONARY/GRAFT ANGIOGRAPHY N/A 10/10/2020   Procedure: RIGHT HEART CATH AND CORONARY/GRAFT ANGIOGRAPHY;  Surgeon: Nelva Bush, MD;  Location: [place] [facility];  Service: Cardiovascular;  Laterality: N/A;   TEE WITHOUT CARDIOVERSION N/A 03/06/2019   Procedure: TRANSESOPHAGEAL ECHOCARDIOGRAM (TEE);  Surgeon: Wonda Olds, MD;  Location: [place];  Service: Open Heart Surgery;  Laterality: N/A;   TEE WITHOUT CARDIOVERSION N/A 10/10/2020   Procedure: TRANSESOPHAGEAL ECHOCARDIOGRAM (TEE);  Surgeon: Josue Hector, MD;  Location: [facility];  Service: Cardiovascular;  Laterality: N/A;   TEE WITHOUT CARDIOVERSION N/A 10/18/2020   Procedure: TRANSESOPHAGEAL ECHOCARDIOGRAM (TEE);  Surgeon: Sherren Mocha, MD;  Location: [place];  Service: Open Heart Surgery;  Laterality: N/A;   TEMPORARY PACEMAKER N/A 10/17/2020   Procedure: TEMPORARY PACEMAKER;  Surgeon: Evans Lance, MD;  Location: [place] [facility];  Service: Cardiovascular;  Laterality: N/A;    Current  Medications: Current Meds  Medication Sig   acetaminophen (TYLENOL) 500 MG tablet Take 1,000 mg by mouth every 6 (six) hours as needed for moderate pain.   allopurinol (ZYLOPRIM) 100 MG tablet Take 1 tablet by mouth once daily   ALPRAZolam (XANAX) 0.5 MG tablet TAKE ONE TABLET BY MOUTH ONCE DAILY AS NEEDED FOR ANIXETY. TAKE 30 MINUTES PRIOR TO PLANE FLIGHT OR AS NEEDED FOR ANXIETY   aspirin EC 81 MG tablet Take 1 tablet  (81 mg total) by mouth daily. Swallow whole.   atorvastatin (LIPITOR) 80 MG tablet Take 1 tablet by mouth once daily   colchicine 0.6 MG tablet Take 2 tablets by mouth once daily   furosemide (LASIX) 20 MG tablet Take 2 tablets in the morning and 1 tablet in the afternoon   ketoconazole (NIZORAL) 2 % shampoo Apply 1 application topically 2 (two) times a week.   latanoprost (XALATAN) 0.005 % ophthalmic solution Place 1 drop into both eyes every morning.    metoprolol succinate (TOPROL-XL) 25 MG 24 hr tablet Take 1 tablet (25 mg total) by mouth 2 (two) times daily.   Misc Natural Products (OSTEO BI-FLEX/5-LOXIN ADVANCED) TABS Take 2 tablets by mouth daily.   potassium chloride SA (KLOR-CON) 20 MEQ tablet Take 1 tablet (20 mEq total) by mouth daily.   sacubitril-valsartan (ENTRESTO) 49-51 MG Take 1 tablet by mouth 2 (two) times daily.   sertraline (ZOLOFT) 50 MG tablet Take 1 tablet by mouth once daily   spironolactone (ALDACTONE) 25 MG tablet Take 1 tablet (25 mg total) by mouth daily.   traMADol (ULTRAM) 50 MG tablet TAKE 1 TABLET BY MOUTH EVERY 4 HOURS AS NEEDED FOR PAIN   traZODone (DESYREL) 50 MG tablet Take 0.5-1 tablets (25-50 mg total) by mouth at bedtime as needed for sleep. (Patient taking differently: Take 50 mg by mouth at bedtime as needed for sleep.)     Allergies:   Heparin   Social History   Socioeconomic History   Marital status: Married    Spouse name: Cindi   Number of children: 3   Years of education: Not on file   Highest education level: Not on file  Occupational History   Occupation: Architect  Tobacco Use   Smoking status: Never   Smokeless tobacco: Never  Vaping Use   Vaping Use: Never used  Substance and Sexual Activity   Alcohol use: Yes    Alcohol/week: 12.0 standard drinks    Types: 12 Cans of beer per week   Drug use: Yes    Frequency: 2.0 times per week    Types: Marijuana    Comment: gummies   Sexual activity: Not on file  Other Topics Concern    Not on file  Social History Narrative   Lives at home with wife.   Social Determinants of Health   Financial Resource Strain: Low Risk    Difficulty of Paying Living Expenses: Not very hard  Food Insecurity: No Food Insecurity   Worried About Charity fundraiser in the Last Year: Never true   Ran Out of Food in the Last Year: Never true  Transportation Needs: No Transportation Needs   Lack of Transportation (Medical): No   Lack of Transportation (Non-Medical): No  Physical Activity: Sufficiently Active   Days of Exercise per Week: 5 days   Minutes of Exercise per Session: 150+ min  Stress: No Stress Concern Present   Feeling of Stress : Not at all  Social Connections: Moderately Isolated  Frequency of Communication with Friends and Family: Once a week   Frequency of Social Gatherings with Friends and Family: Three times a week   Attends Religious Services: Never   Active Member of Clubs or Organizations: No   Attends Archivist Meetings: Never   Marital Status: Married     Family History: The patient's family history includes Alcohol abuse in his father; Breast cancer in his sister; Cancer in his mother. He was adopted.  ROS:   Please see the history of present illness.    All other systems reviewed and are negative.  EKGs/Labs/Other Studies Reviewed:    The following studies were reviewed today:  10/19/2020 Echo - EF 30-35% 11/14/2020 echo - LVEF45%  January 25, 2021 in clinic device interrogation personally reviewed Battery stable Lead parameters all stable Changed atrial outputs to maximize battery longevity Biventricular pacing greater than 99% 5 AMS episodes, longest 8 seconds  EKG:  The ekg ordered today demonstrates AV sequential pacing with biventricular pacing.  3 EKGs taken with different LV/RV timing.  QRS 158 ms with LV preset about 30 ms.  Recent Labs: 09/28/2020: B Natriuretic Peptide 2,498.6; TSH 1.620 10/20/2020: Magnesium  1.7 01/13/2021: ALT 17; BUN 19; Creatinine 0.83; Potassium 4.0; Sodium 139 01/16/2021: Hemoglobin 9.2; Platelet Count 165  Recent Lipid Panel    Component Value Date/Time   CHOL 151 03/21/2020 0805   CHOL 132 08/07/2019 0811   TRIG 98.0 03/21/2020 0805   TRIG 162 (H) 10/25/2005 1507   HDL 67.70 03/21/2020 0805   HDL 61 08/07/2019 0811   CHOLHDL 2 03/21/2020 0805   VLDL 19.6 03/21/2020 0805   LDLCALC 63 03/21/2020 0805   LDLCALC 52 08/07/2019 0811   LDLDIRECT 151.0 01/05/2016 0748    Physical Exam:    VS:  BP 108/68 (BP Location: Left Arm, Patient Position: Sitting, Cuff Size: Normal)    Pulse 72    Ht 5' 6.5" (1.689 m)    Wt 220 lb (99.8 kg)    SpO2 94%    BMI 34.98 kg/m     Wt Readings from Last 3 Encounters:  01/25/21 220 lb (99.8 kg)  01/16/21 222 lb 8 oz (100.9 kg)  01/05/21 209 lb 12.8 oz (95.2 kg)     GEN:  Well nourished, well developed in no acute distress HEENT: Normal NECK: No JVD; No carotid bruits LYMPHATICS: No lymphadenopathy CARDIAC: RRR, no murmurs, rubs, gallops.  CRT-D pocket well-healed. RESPIRATORY:  Clear to auscultation without rales, wheezing or rhonchi  ABDOMEN: Soft, non-tender, non-distended MUSCULOSKELETAL:  No edema; No deformity  SKIN: Warm and dry NEUROLOGIC:  Alert and oriented x 3 PSYCHIATRIC:  Normal affect        ASSESSMENT:    1. Chronic systolic HF (heart failure) (HCC)   2. Cardiac resynchronization therapy defibrillator (CRT-D) in place   3. Ischemic cardiomyopathy    PLAN:    In order of problems listed above:   #Chronic combined systolic and diastolic heart failure #CRT-D in situ NYHA class II today.  Warm dry on exam.  Continue current medical therapy.  CRT-D functioning well and has normalized ejection fraction.     Medication Adjustments/Labs and Tests Ordered: Current medicines are reviewed at length with the patient today.  Concerns regarding medicines are outlined above.  Orders Placed This Encounter   Procedures   EKG 12-Lead   No orders of the defined types were placed in this encounter.    Signed, Lars Mage, MD, [facility], [facility] 01/25/2021 8:37 PM  Electrophysiology [facility]

## 2021-01-25 ENCOUNTER — Other Ambulatory Visit: Payer: Self-pay

## 2021-01-25 ENCOUNTER — Encounter: Payer: Self-pay | Admitting: Cardiology

## 2021-01-25 ENCOUNTER — Ambulatory Visit (INDEPENDENT_AMBULATORY_CARE_PROVIDER_SITE_OTHER): Payer: Medicare HMO | Admitting: Cardiology

## 2021-01-25 VITALS — BP 108/68 | HR 72 | Ht 66.5 in | Wt 220.0 lb

## 2021-01-25 DIAGNOSIS — Z95810 Presence of automatic (implantable) cardiac defibrillator: Secondary | ICD-10-CM | POA: Diagnosis not present

## 2021-01-25 DIAGNOSIS — I5022 Chronic systolic (congestive) heart failure: Secondary | ICD-10-CM

## 2021-01-25 DIAGNOSIS — I255 Ischemic cardiomyopathy: Secondary | ICD-10-CM

## 2021-01-25 NOTE — Patient Instructions (Addendum)
Medications: Your physician recommends that you continue on your current medications as directed. Please refer to the Current Medication list given to you today. *If you need a refill on your cardiac medications before your next appointment, please call your pharmacy*  Lab Work: None. If you have labs (blood work) drawn today and your tests are completely normal, you will receive your results only by: [place] (if you have MyChart) OR A paper copy in the mail If you have any lab test that is abnormal or we need to change your treatment, we will call you to review the results.  Testing/Procedures: None.  Follow-Up: At [facility], you and your health needs are our priority.  As part of our continuing mission to provide you with exceptional heart care, we have created designated Provider Care Teams.  These Care Teams include your primary Cardiologist (physician) and Advanced Practice Providers (APPs -  Physician Assistants and Nurse Practitioners) who all work together to provide you with the care you need, when you need it.  Your physician wants you to follow-up in: 9 months with  one of the following Advanced Practice Providers on your designated Care Team:    Ignacia Bayley, NP Christell Faith PA Cadence Kathlen Mody PA    You will receive a reminder letter in the mail two months in advance. If you don't receive a letter, please call our office to schedule the follow-up appointment.  Remote monitoring is used to monitor your ICD from home. This monitoring reduces the number of office visits required to check your device to one time per year. It allows [place] to keep an eye on the functioning of your device to ensure it is working properly. You are scheduled for a device check from home on 04/20/21. You may send your transmission at any time that day. If you have a wireless device, the transmission will be sent automatically. After your physician reviews your transmission, you will receive a postcard  with your next transmission date.  We recommend signing up for the patient portal called "MyChart".  Sign up information is provided on this After Visit Summary.  MyChart is used to connect with patients for Virtual Visits (Telemedicine).  Patients are able to view lab/test results, encounter notes, upcoming appointments, etc.  Non-urgent messages can be sent to your provider as well.   To learn more about what you can do with MyChart, go to [email or web address].    Any Other Special Instructions Will Be Listed Below (If Applicable).

## 2021-01-26 ENCOUNTER — Inpatient Hospital Stay: Payer: Medicare HMO

## 2021-01-26 VITALS — BP 113/72 | HR 70 | Temp 98.1°F | Resp 18

## 2021-01-26 DIAGNOSIS — Z7901 Long term (current) use of anticoagulants: Secondary | ICD-10-CM | POA: Diagnosis not present

## 2021-01-26 DIAGNOSIS — Z862 Personal history of diseases of the blood and blood-forming organs and certain disorders involving the immune mechanism: Secondary | ICD-10-CM | POA: Diagnosis not present

## 2021-01-26 DIAGNOSIS — D7589 Other specified diseases of blood and blood-forming organs: Secondary | ICD-10-CM | POA: Diagnosis not present

## 2021-01-26 DIAGNOSIS — E538 Deficiency of other specified B group vitamins: Secondary | ICD-10-CM | POA: Diagnosis not present

## 2021-01-26 MED ORDER — CYANOCOBALAMIN 1000 MCG/ML IJ SOLN
1000.0000 ug | Freq: Once | INTRAMUSCULAR | Status: AC
  Administered 2021-01-26: 1000 ug via INTRAMUSCULAR
  Filled 2021-01-26: qty 1

## 2021-01-30 ENCOUNTER — Other Ambulatory Visit: Payer: Self-pay

## 2021-01-30 ENCOUNTER — Inpatient Hospital Stay: Payer: Medicare HMO

## 2021-01-30 DIAGNOSIS — Z862 Personal history of diseases of the blood and blood-forming organs and certain disorders involving the immune mechanism: Secondary | ICD-10-CM | POA: Diagnosis not present

## 2021-01-30 DIAGNOSIS — E538 Deficiency of other specified B group vitamins: Secondary | ICD-10-CM | POA: Diagnosis not present

## 2021-01-30 DIAGNOSIS — D7589 Other specified diseases of blood and blood-forming organs: Secondary | ICD-10-CM | POA: Diagnosis not present

## 2021-01-30 DIAGNOSIS — Z7901 Long term (current) use of anticoagulants: Secondary | ICD-10-CM | POA: Diagnosis not present

## 2021-01-30 MED ORDER — CYANOCOBALAMIN 1000 MCG/ML IJ SOLN
1000.0000 ug | Freq: Once | INTRAMUSCULAR | Status: AC
  Administered 2021-01-30: 1000 ug via INTRAMUSCULAR
  Filled 2021-01-30: qty 1

## 2021-02-01 NOTE — Progress Notes (Signed)
Remote ICD transmission.   

## 2021-02-02 ENCOUNTER — Ambulatory Visit: Payer: Medicare HMO

## 2021-02-02 ENCOUNTER — Other Ambulatory Visit: Payer: Self-pay

## 2021-02-02 ENCOUNTER — Inpatient Hospital Stay: Payer: Medicare HMO | Attending: Hematology and Oncology

## 2021-02-02 DIAGNOSIS — E538 Deficiency of other specified B group vitamins: Secondary | ICD-10-CM | POA: Insufficient documentation

## 2021-02-02 MED ORDER — CYANOCOBALAMIN 1000 MCG/ML IJ SOLN
1000.0000 ug | Freq: Once | INTRAMUSCULAR | Status: AC
  Administered 2021-02-02: 1000 ug via INTRAMUSCULAR
  Filled 2021-02-02: qty 1

## 2021-02-02 NOTE — Patient Instructions (Signed)
Vitamin B12 Deficiency ?Vitamin B12 deficiency occurs when the body does not have enough vitamin B12, which is an important vitamin. The body needs this vitamin: ?To make red blood cells. ?To make DNA. This is the genetic material inside cells. ?To help the nerves work properly so they can carry messages from the brain to the body. ?Vitamin B12 deficiency can cause various health problems, such as a low red blood cell count (anemia) or nerve damage. ?What are the causes? ?This condition may be caused by: ?Not eating enough foods that contain vitamin B12. ?Not having enough stomach acid and digestive fluids to properly absorb vitamin B12 from the food that you eat. ?Certain digestive system diseases that make it hard to absorb vitamin B12. These diseases include Crohn's disease, chronic pancreatitis, and cystic fibrosis. ?A condition in which the body does not make enough of a protein (intrinsic factor), resulting in too few red blood cells (pernicious anemia). ?Having a surgery in which part of the stomach or small intestine is removed. ?Taking certain medicines that make it hard for the body to absorb vitamin B12. These medicines include: ?Heartburn medicines (antacids and proton pump inhibitors). ?Certain antibiotic medicines. ?Some medicines that are used to treat diabetes, tuberculosis, gout, or high cholesterol. ?What increases the risk? ?The following factors may make you more likely to develop a B12 deficiency: ?Being older than age 50. ?Eating a vegetarian or vegan diet, especially while you are pregnant. ?Eating a poor diet while you are pregnant. ?Taking certain medicines. ?Having alcoholism. ?What are the signs or symptoms? ?In some cases, there are no symptoms of this condition. If the condition leads to anemia or nerve damage, various symptoms can occur, such as: ?Weakness. ?Fatigue. ?Loss of appetite. ?Weight loss. ?Numbness or tingling in your hands and feet. ?Redness and burning of the  tongue. ?Confusion or memory problems. ?Depression. ?Sensory problems, such as color blindness, ringing in the ears, or loss of taste. ?Diarrhea or constipation. ?Trouble walking. ?If anemia is severe, symptoms can include: ?Shortness of breath. ?Dizziness. ?Rapid heart rate (tachycardia). ?How is this diagnosed? ?This condition may be diagnosed with a blood test to measure the level of vitamin B12 in your blood. You may also have other tests, including: ?A group of tests that measure certain characteristics of blood cells (complete blood count, CBC). ?A blood test to measure intrinsic factor. ?A procedure where a thin tube with a camera on the end is used to look into your stomach or intestines (endoscopy). ?Other tests may be needed to discover the cause of B12 deficiency. ?How is this treated? ?Treatment for this condition depends on the cause. This condition may be treated by: ?Changing your eating and drinking habits, such as: ?Eating more foods that contain vitamin B12. ?Drinking less alcohol or no alcohol. ?Getting vitamin B12 injections. ?Taking vitamin B12 supplements. Your health care provider will tell you which dosage is best for you. ?Follow these instructions at home: ?Eating and drinking ? ?Eat lots of healthy foods that contain vitamin B12, including: ?Meats and poultry. This includes beef, pork, chicken, turkey, and organ meats, such as liver. ?Seafood. This includes clams, rainbow trout, salmon, tuna, and haddock. ?Eggs. ?Cereal and dairy products that are fortified. This means that vitamin B12 has been added to the food. Check the label on the package to see if the food is fortified. ?The items listed above may not be a complete list of recommended foods and beverages. Contact a dietitian for more information. ?General instructions ?Get any   injections that are prescribed by your health care provider. ?Take supplements only as told by your health care provider. Follow the directions carefully. ?Do  not drink alcohol if your health care provider tells you not to. In some cases, you may only be asked to limit alcohol use. ?Keep all follow-up visits as told by your health care provider. This is important. ?Contact a health care provider if: ?Your symptoms come back. ?Get help right away if you: ?Develop shortness of breath. ?Have a rapid heart rate. ?Have chest pain. ?Become dizzy or lose consciousness. ?Summary ?Vitamin B12 deficiency occurs when the body does not have enough vitamin B12. ?The main causes of vitamin B12 deficiency include dietary deficiency, digestive diseases, pernicious anemia, and having a surgery in which part of the stomach or small intestine is removed. ?In some cases, there are no symptoms of this condition. If the condition leads to anemia or nerve damage, various symptoms can occur, such as weakness, shortness of breath, and numbness. ?Treatment may include getting vitamin B12 injections or taking vitamin B12 supplements. Eat lots of healthy foods that contain vitamin B12. ?This information is not intended to replace advice given to you by your health care provider. Make sure you discuss any questions you have with your health care provider. ?Document Revised: 06/15/2020 Document Reviewed: 08/27/2017 ?Elsevier Patient Education ? 2022 Elsevier Inc. ? ?

## 2021-02-06 ENCOUNTER — Inpatient Hospital Stay: Payer: Medicare HMO

## 2021-02-06 ENCOUNTER — Other Ambulatory Visit: Payer: Self-pay

## 2021-02-06 DIAGNOSIS — E538 Deficiency of other specified B group vitamins: Secondary | ICD-10-CM

## 2021-02-06 MED ORDER — CYANOCOBALAMIN 1000 MCG/ML IJ SOLN
1000.0000 ug | Freq: Once | INTRAMUSCULAR | Status: AC
  Administered 2021-02-06: 1000 ug via INTRAMUSCULAR
  Filled 2021-02-06: qty 1

## 2021-02-10 ENCOUNTER — Telehealth: Payer: Self-pay

## 2021-02-10 NOTE — Progress Notes (Signed)
° ° °  Chronic Care Management Pharmacy Assistant   Name: Gregory [name]  MRN: [id number] DOB: 1950/10/29  Reason for Encounter: CCM (Appointment Reminder)   Medications: Outpatient Encounter Medications as of 02/10/2021  Medication Sig   acetaminophen (TYLENOL) 500 MG tablet Take 1,000 mg by mouth every 6 (six) hours as needed for moderate pain.   allopurinol (ZYLOPRIM) 100 MG tablet Take 1 tablet by mouth once daily   ALPRAZolam (XANAX) 0.5 MG tablet TAKE ONE TABLET BY MOUTH ONCE DAILY AS NEEDED FOR ANIXETY. TAKE 30 MINUTES PRIOR TO PLANE FLIGHT OR AS NEEDED FOR ANXIETY   aspirin EC 81 MG tablet Take 1 tablet (81 mg total) by mouth daily. Swallow whole.   atorvastatin (LIPITOR) 80 MG tablet Take 1 tablet by mouth once daily   colchicine 0.6 MG tablet Take 2 tablets by mouth once daily   furosemide (LASIX) 20 MG tablet Take 2 tablets in the morning and 1 tablet in the afternoon   ketoconazole (NIZORAL) 2 % shampoo Apply 1 application topically 2 (two) times a week.   latanoprost (XALATAN) 0.005 % ophthalmic solution Place 1 drop into both eyes every morning.    metoprolol succinate (TOPROL-XL) 25 MG 24 hr tablet Take 1 tablet (25 mg total) by mouth 2 (two) times daily.   Misc Natural Products (OSTEO BI-FLEX/5-LOXIN ADVANCED) TABS Take 2 tablets by mouth daily.   potassium chloride SA (KLOR-CON) 20 MEQ tablet Take 1 tablet (20 mEq total) by mouth daily.   sacubitril-valsartan (ENTRESTO) 49-51 MG Take 1 tablet by mouth 2 (two) times daily.   sertraline (ZOLOFT) 50 MG tablet Take 1 tablet by mouth once daily   spironolactone (ALDACTONE) 25 MG tablet Take 1 tablet (25 mg total) by mouth daily.   traMADol (ULTRAM) 50 MG tablet TAKE 1 TABLET BY MOUTH EVERY 4 HOURS AS NEEDED FOR PAIN   traZODone (DESYREL) 50 MG tablet Take 0.5-1 tablets (25-50 mg total) by mouth at bedtime as needed for sleep. (Patient taking differently: Take 50 mg by mouth at bedtime as needed for sleep.)   No  facility-administered encounter medications on file as of 02/10/2021.   Gregory [name] was contacted to remind of upcoming telephone visit with [name] on 02/15/2021 at 1:00 pm. Patient was reminded to have all medications, supplements and any blood glucose and blood pressure readings available for review at appointment. If unable to reach, a voicemail was left for patient.   Are you having any problems with your medications? No   Do you have any concerns you like to discuss with the pharmacist? No  Star Rating Drugs: Medication:    Last Fill: Day Supply Atorvastatin 80 mg   01/09/2021 90 Sacubitril-Valsartan 49-51 mg 01/23/2021 [place], CPP notified  Marijean Niemann, [place]  Time Spent: 10 Minutes

## 2021-02-10 NOTE — Progress Notes (Signed)
Patient Care Team: Jinny Sanders, MD as PCP - General (Family Medicine) [name], MD as PCP - Cardiology (Cardiology) Vickie Epley, MD as PCP - Electrophysiology (Cardiology) Lendon Colonel, NP as Nurse Practitioner (Cardiology) Debbora Dus, [facility] as Pharmacist (Pharmacist)  DIAGNOSIS:    ICD-10-CM   1. Iron deficiency anemia, unspecified iron deficiency anemia type  D50.9 CBC with Differential (Cancer Center Only)    Vitamin B12    Ferritin    Iron and Iron Binding Capacity ([facility] only)    Erythropoietin      CHIEF COMPLIANT: Follow-up of heparin-induced thrombocytopenia and chronic macrocytic anemia  INTERVAL HISTORY: Gregory [name] is a 71 y.o. with above-mentioned history of heparin-induced thrombocytopenia and chronic macrocytic anemia. Gregory [name] presents to the clinic today for follow-up.  Gregory [name] is currently receiving B12 injections.  Gregory [name] also reports to me that Gregory [name] stopped drinking most of alcohol.  Gregory [name] may be having very occasional drink of beer.  Overall Gregory [name] feels better.  ALLERGIES:  is allergic to heparin.  MEDICATIONS:  Current Outpatient Medications  Medication Sig Dispense Refill   acetaminophen (TYLENOL) 500 MG tablet Take 1,000 mg by mouth every 6 (six) hours as needed for moderate pain.     allopurinol (ZYLOPRIM) 100 MG tablet Take 1 tablet by mouth once daily 90 tablet 1   ALPRAZolam (XANAX) 0.5 MG tablet TAKE ONE TABLET BY MOUTH ONCE DAILY AS NEEDED FOR ANIXETY. TAKE 30 MINUTES PRIOR TO PLANE FLIGHT OR AS NEEDED FOR ANXIETY 20 tablet 0   aspirin EC 81 MG tablet Take 1 tablet (81 mg total) by mouth daily. Swallow whole. 30 tablet 11   atorvastatin (LIPITOR) 80 MG tablet Take 1 tablet by mouth once daily 90 tablet 0   colchicine 0.6 MG tablet Take 2 tablets by mouth once daily 60 tablet 5   furosemide (LASIX) 20 MG tablet Take 2 tablets in the morning and 1 tablet in the afternoon 90 tablet 3   ketoconazole (NIZORAL) 2 % shampoo Apply 1 application  topically 2 (two) times a week. 120 mL 1   latanoprost (XALATAN) 0.005 % ophthalmic solution Place 1 drop into both eyes every morning.      metoprolol succinate (TOPROL-XL) 25 MG 24 hr tablet Take 1 tablet (25 mg total) by mouth 2 (two) times daily. 180 tablet 3   Misc Natural Products (OSTEO BI-FLEX/5-LOXIN ADVANCED) TABS Take 2 tablets by mouth daily.     potassium chloride SA (KLOR-CON) 20 MEQ tablet Take 1 tablet (20 mEq total) by mouth daily. 90 tablet 2   sacubitril-valsartan (ENTRESTO) 49-51 MG Take 1 tablet by mouth 2 (two) times daily. 60 tablet 5   sertraline (ZOLOFT) 50 MG tablet Take 1 tablet by mouth once daily 90 tablet 0   spironolactone (ALDACTONE) 25 MG tablet Take 1 tablet (25 mg total) by mouth daily. 90 tablet 3   traMADol (ULTRAM) 50 MG tablet TAKE 1 TABLET BY MOUTH EVERY 4 HOURS AS NEEDED FOR PAIN 30 tablet 0   traZODone (DESYREL) 50 MG tablet Take 0.5-1 tablets (25-50 mg total) by mouth at bedtime as needed for sleep. (Patient taking differently: Take 50 mg by mouth at bedtime as needed for sleep.) 30 tablet 3   No current facility-administered medications for this visit.    PHYSICAL EXAMINATION: ECOG PERFORMANCE STATUS: 1 - Symptomatic but completely ambulatory  Vitals:   02/13/21 1019  BP: (!) 124/54  Pulse: 72  Resp: 18  Temp: (!) 97.5 F (36.4 C)  SpO2: 98%   Filed Weights   02/13/21 1019  Weight: 221 lb 6.4 oz (100.4 kg)    LABORATORY DATA:  I have reviewed the data as listed CMP Latest Ref Rng & Units 01/13/2021 11/17/2020 11/02/2020  Glucose 70 - 99 mg/dL 87 113(H) 94  BUN 8 - 23 mg/dL 19 15 23   Creatinine 0.61 - 1.24 mg/dL 0.83 0.83 1.25  Sodium 135 - 145 mmol/L 139 140 141  Potassium 3.5 - 5.1 mmol/L 4.0 4.1 5.2  Chloride 98 - 111 mmol/L 105 104 103  CO2 22 - 32 mmol/L 25 26 22   Calcium 8.9 - 10.3 mg/dL 8.8(L) 8.7(L) 9.5  Total Protein 6.5 - 8.1 g/dL 6.5 6.6 -  Total Bilirubin 0.3 - 1.2 mg/dL 1.3(H) 1.3(H) -  Alkaline Phos 38 - 126 U/L 40 47  -  AST 15 - 41 U/L 50(H) 49(H) -  ALT 0 - 44 U/L 17 16 -    Lab Results  Component Value Date   WBC 4.8 02/13/2021   HGB 10.0 (L) 02/13/2021   HCT 30.4 (L) 02/13/2021   MCV 103.1 (H) 02/13/2021   PLT 187 02/13/2021   NEUTROABS 2.9 02/13/2021    ASSESSMENT & PLAN:  Iron deficiency anemia Work-up: TSH: 2.56: Normal CMP: AST 43, ALT 24, bilirubin 1.9, LDH 1004 (direct Coombs test negative) SPEP: No M protein Reticulocyte count: 3.8%, absolute reticulocyte count 122, reticulocyte hemoglobin: 23.9 (suggestive of iron deficiency or ineffective erythropoiesis) X32: 355, folic acid 73.2 Erythropoietin: 235.3 09/16/2020: Iron studies revealed ferritin of 14 and iron saturation of 5.3% (IV iron treatment)   Elevated LDH with normal direct Coombs test: Concern for intravascular hemolysis possibly from severe valvular heart disease   11/17/2020: Hemoglobin 9.6, WBC 3.3, platelets 142, ANC 1.9, ferritin 158, iron saturation 31% (improv from 5%) 01/13/21: Hb 9.9, WBC: 5.5, MCV 105.6, Platelet 165, TB 1.3, Ferritin 50, Iron Sat: 25%,  01/16/2021: Hemoglobin 9.2, MCV 105.1, B12 117 (start B12 injections) 02/13/2021: Hemoglobin 10, MCV 103.1, B12 pending  Knee replacement surgery: Needs a hemoglobin of 11.  If in a month his hemoglobin is not back up and we may have to call his surgeon to proceed with surgery and give blood transfusion postoperatively.  Return to clinic in 1 month with labs and injection appointments and follow-up with me. Gregory [name] quit drinking alcohol as well.      Orders Placed This Encounter  Procedures   CBC with Differential ([facility] Only)    Standing Status:   Future    Standing Expiration Date:   02/13/2022   Vitamin B12    Standing Status:   Future    Standing Expiration Date:   02/13/2022   Ferritin    Standing Status:   Future    Standing Expiration Date:   02/13/2022   Iron and Iron Binding Capacity ([facility] only)    Standing Status:   Future    Standing  Expiration Date:   02/13/2022   Erythropoietin    Standing Status:   Future    Standing Expiration Date:   02/13/2022   The patient has a good understanding of the overall plan. Gregory [name] agrees with it. Gregory [name] will call with any problems that may develop before the next visit here.  Total time spent: 20 mins including face to face time and time spent for planning, charting and coordination of care  Rulon Eisenmenger, MD, MPH 02/13/2021  I, Thana Ates, am acting as scribe for Dr. Nicholas Lose.  I  have reviewed the above documentation for accuracy and completeness, and I agree with the above.

## 2021-02-13 ENCOUNTER — Inpatient Hospital Stay: Payer: Medicare HMO

## 2021-02-13 ENCOUNTER — Inpatient Hospital Stay: Payer: Medicare HMO | Admitting: Hematology and Oncology

## 2021-02-13 ENCOUNTER — Encounter: Payer: Self-pay | Admitting: Hematology and Oncology

## 2021-02-13 ENCOUNTER — Other Ambulatory Visit: Payer: Self-pay

## 2021-02-13 DIAGNOSIS — D509 Iron deficiency anemia, unspecified: Secondary | ICD-10-CM | POA: Diagnosis not present

## 2021-02-13 DIAGNOSIS — E538 Deficiency of other specified B group vitamins: Secondary | ICD-10-CM | POA: Diagnosis not present

## 2021-02-13 DIAGNOSIS — D539 Nutritional anemia, unspecified: Secondary | ICD-10-CM

## 2021-02-13 DIAGNOSIS — Z862 Personal history of diseases of the blood and blood-forming organs and certain disorders involving the immune mechanism: Secondary | ICD-10-CM

## 2021-02-13 LAB — VITAMIN B12: Vitamin B-12: 530 pg/mL (ref 180–914)

## 2021-02-13 LAB — CBC WITH DIFFERENTIAL (CANCER CENTER ONLY)
Abs Immature Granulocytes: 0.01 10*3/uL (ref 0.00–0.07)
Basophils Absolute: 0 10*3/uL (ref 0.0–0.1)
Basophils Relative: 0 %
Eosinophils Absolute: 0.1 10*3/uL (ref 0.0–0.5)
Eosinophils Relative: 2 %
HCT: 30.4 % — ABNORMAL LOW (ref 39.0–52.0)
Hemoglobin: 10 g/dL — ABNORMAL LOW (ref 13.0–17.0)
Immature Granulocytes: 0 %
Lymphocytes Relative: 27 %
Lymphs Abs: 1.3 10*3/uL (ref 0.7–4.0)
MCH: 33.9 pg (ref 26.0–34.0)
MCHC: 32.9 g/dL (ref 30.0–36.0)
MCV: 103.1 fL — ABNORMAL HIGH (ref 80.0–100.0)
Monocytes Absolute: 0.5 10*3/uL (ref 0.1–1.0)
Monocytes Relative: 10 %
Neutro Abs: 2.9 10*3/uL (ref 1.7–7.7)
Neutrophils Relative %: 61 %
Platelet Count: 187 10*3/uL (ref 150–400)
RBC: 2.95 MIL/uL — ABNORMAL LOW (ref 4.22–5.81)
RDW: 12.3 % (ref 11.5–15.5)
WBC Count: 4.8 10*3/uL (ref 4.0–10.5)
nRBC: 0 % (ref 0.0–0.2)

## 2021-02-13 LAB — RETIC PANEL
Immature Retic Fract: 16 % — ABNORMAL HIGH (ref 2.3–15.9)
RBC.: 2.98 MIL/uL — ABNORMAL LOW (ref 4.22–5.81)
Retic Count, Absolute: 102.8 10*3/uL (ref 19.0–186.0)
Retic Ct Pct: 3.5 % — ABNORMAL HIGH (ref 0.4–3.1)
Reticulocyte Hemoglobin: 33.3 pg (ref >27.9)

## 2021-02-13 MED ORDER — CYANOCOBALAMIN 1000 MCG/ML IJ SOLN
1000.0000 ug | Freq: Once | INTRAMUSCULAR | Status: AC
  Administered 2021-02-13: 1000 ug via INTRAMUSCULAR
  Filled 2021-02-13: qty 1

## 2021-02-13 NOTE — Assessment & Plan Note (Signed)
Work-up: TSH: 2.56: Normal CMP: AST 43, ALT 24, bilirubin 1.9, LDH 1004 (direct Coombs test negative) SPEP: No M protein Reticulocyte count: 3.8%, absolute reticulocyte count 122, reticulocyte hemoglobin: 23.9 (suggestive of iron deficiency or ineffective erythropoiesis) W11: 003, folic acid 49.6 Erythropoietin: 235.3 09/16/2020: Iron studies revealed ferritin of 14 and iron saturation of 5.3%(IV iron treatment)  Elevated LDH with normal direct Coombs test: Concern for intravascular hemolysis possibly from severe valvular heart disease  11/17/2020: Hemoglobin 9.6, WBC 3.3, platelets 142, ANC 1.9, ferritin 158, iron saturation 31% (improv from 5%) 01/13/21: Hb 9.9, WBC: 5.5, MCV 105.6, Platelet 165, TB 1.3, Ferritin 50, Iron Sat: 25%,  01/16/2021: Hemoglobin 9.2, MCV 105.1, B12 117 (start B12 injections) 02/13/2021:  Knee replacement surgery: Needs a hemoglobin of 11

## 2021-02-13 NOTE — Patient Instructions (Signed)
Vitamin B12 Deficiency ?Vitamin B12 deficiency occurs when the body does not have enough vitamin B12, which is an important vitamin. The body needs this vitamin: ?To make red blood cells. ?To make DNA. This is the genetic material inside cells. ?To help the nerves work properly so they can carry messages from the brain to the body. ?Vitamin B12 deficiency can cause various health problems, such as a low red blood cell count (anemia) or nerve damage. ?What are the causes? ?This condition may be caused by: ?Not eating enough foods that contain vitamin B12. ?Not having enough stomach acid and digestive fluids to properly absorb vitamin B12 from the food that you eat. ?Certain digestive system diseases that make it hard to absorb vitamin B12. These diseases include Crohn's disease, chronic pancreatitis, and cystic fibrosis. ?A condition in which the body does not make enough of a protein (intrinsic factor), resulting in too few red blood cells (pernicious anemia). ?Having a surgery in which part of the stomach or small intestine is removed. ?Taking certain medicines that make it hard for the body to absorb vitamin B12. These medicines include: ?Heartburn medicines (antacids and proton pump inhibitors). ?Certain antibiotic medicines. ?Some medicines that are used to treat diabetes, tuberculosis, gout, or high cholesterol. ?What increases the risk? ?The following factors may make you more likely to develop a B12 deficiency: ?Being older than age 50. ?Eating a vegetarian or vegan diet, especially while you are pregnant. ?Eating a poor diet while you are pregnant. ?Taking certain medicines. ?Having alcoholism. ?What are the signs or symptoms? ?In some cases, there are no symptoms of this condition. If the condition leads to anemia or nerve damage, various symptoms can occur, such as: ?Weakness. ?Fatigue. ?Loss of appetite. ?Weight loss. ?Numbness or tingling in your hands and feet. ?Redness and burning of the  tongue. ?Confusion or memory problems. ?Depression. ?Sensory problems, such as color blindness, ringing in the ears, or loss of taste. ?Diarrhea or constipation. ?Trouble walking. ?If anemia is severe, symptoms can include: ?Shortness of breath. ?Dizziness. ?Rapid heart rate (tachycardia). ?How is this diagnosed? ?This condition may be diagnosed with a blood test to measure the level of vitamin B12 in your blood. You may also have other tests, including: ?A group of tests that measure certain characteristics of blood cells (complete blood count, CBC). ?A blood test to measure intrinsic factor. ?A procedure where a thin tube with a camera on the end is used to look into your stomach or intestines (endoscopy). ?Other tests may be needed to discover the cause of B12 deficiency. ?How is this treated? ?Treatment for this condition depends on the cause. This condition may be treated by: ?Changing your eating and drinking habits, such as: ?Eating more foods that contain vitamin B12. ?Drinking less alcohol or no alcohol. ?Getting vitamin B12 injections. ?Taking vitamin B12 supplements. Your health care provider will tell you which dosage is best for you. ?Follow these instructions at home: ?Eating and drinking ? ?Eat lots of healthy foods that contain vitamin B12, including: ?Meats and poultry. This includes beef, pork, chicken, turkey, and organ meats, such as liver. ?Seafood. This includes clams, rainbow trout, salmon, tuna, and haddock. ?Eggs. ?Cereal and dairy products that are fortified. This means that vitamin B12 has been added to the food. Check the label on the package to see if the food is fortified. ?The items listed above may not be a complete list of recommended foods and beverages. Contact a dietitian for more information. ?General instructions ?Get any   injections that are prescribed by your health care provider. ?Take supplements only as told by your health care provider. Follow the directions carefully. ?Do  not drink alcohol if your health care provider tells you not to. In some cases, you may only be asked to limit alcohol use. ?Keep all follow-up visits as told by your health care provider. This is important. ?Contact a health care provider if: ?Your symptoms come back. ?Get help right away if you: ?Develop shortness of breath. ?Have a rapid heart rate. ?Have chest pain. ?Become dizzy or lose consciousness. ?Summary ?Vitamin B12 deficiency occurs when the body does not have enough vitamin B12. ?The main causes of vitamin B12 deficiency include dietary deficiency, digestive diseases, pernicious anemia, and having a surgery in which part of the stomach or small intestine is removed. ?In some cases, there are no symptoms of this condition. If the condition leads to anemia or nerve damage, various symptoms can occur, such as weakness, shortness of breath, and numbness. ?Treatment may include getting vitamin B12 injections or taking vitamin B12 supplements. Eat lots of healthy foods that contain vitamin B12. ?This information is not intended to replace advice given to you by your health care provider. Make sure you discuss any questions you have with your health care provider. ?Document Revised: 06/15/2020 Document Reviewed: 08/27/2017 ?Elsevier Patient Education ? 2022 Elsevier Inc. ? ?

## 2021-02-14 ENCOUNTER — Ambulatory Visit: Payer: Medicare HMO

## 2021-02-14 ENCOUNTER — Ambulatory Visit: Payer: Medicare HMO | Admitting: Hematology and Oncology

## 2021-02-14 ENCOUNTER — Other Ambulatory Visit: Payer: Medicare HMO

## 2021-02-14 ENCOUNTER — Other Ambulatory Visit: Payer: Self-pay | Admitting: *Deleted

## 2021-02-14 DIAGNOSIS — D509 Iron deficiency anemia, unspecified: Secondary | ICD-10-CM

## 2021-02-15 ENCOUNTER — Inpatient Hospital Stay: Payer: Medicare HMO

## 2021-02-15 ENCOUNTER — Ambulatory Visit (INDEPENDENT_AMBULATORY_CARE_PROVIDER_SITE_OTHER): Payer: Medicare HMO | Admitting: Pharmacist

## 2021-02-15 ENCOUNTER — Encounter: Payer: Self-pay | Admitting: *Deleted

## 2021-02-15 ENCOUNTER — Other Ambulatory Visit: Payer: Self-pay | Admitting: *Deleted

## 2021-02-15 ENCOUNTER — Other Ambulatory Visit: Payer: Self-pay

## 2021-02-15 ENCOUNTER — Telehealth: Payer: Self-pay | Admitting: Pharmacist

## 2021-02-15 DIAGNOSIS — I10 Essential (primary) hypertension: Secondary | ICD-10-CM

## 2021-02-15 DIAGNOSIS — E538 Deficiency of other specified B group vitamins: Secondary | ICD-10-CM | POA: Diagnosis not present

## 2021-02-15 DIAGNOSIS — M1712 Unilateral primary osteoarthritis, left knee: Secondary | ICD-10-CM

## 2021-02-15 DIAGNOSIS — I2510 Atherosclerotic heart disease of native coronary artery without angina pectoris: Secondary | ICD-10-CM

## 2021-02-15 DIAGNOSIS — I5189 Other ill-defined heart diseases: Secondary | ICD-10-CM

## 2021-02-15 DIAGNOSIS — E785 Hyperlipidemia, unspecified: Secondary | ICD-10-CM

## 2021-02-15 DIAGNOSIS — D509 Iron deficiency anemia, unspecified: Secondary | ICD-10-CM

## 2021-02-15 DIAGNOSIS — F411 Generalized anxiety disorder: Secondary | ICD-10-CM

## 2021-02-15 LAB — CBC WITH DIFFERENTIAL (CANCER CENTER ONLY)
Abs Immature Granulocytes: 0.01 10*3/uL (ref 0.00–0.07)
Basophils Absolute: 0 10*3/uL (ref 0.0–0.1)
Basophils Relative: 1 %
Eosinophils Absolute: 0.1 10*3/uL (ref 0.0–0.5)
Eosinophils Relative: 3 %
HCT: 30.5 % — ABNORMAL LOW (ref 39.0–52.0)
Hemoglobin: 10 g/dL — ABNORMAL LOW (ref 13.0–17.0)
Immature Granulocytes: 0 %
Lymphocytes Relative: 27 %
Lymphs Abs: 1.2 10*3/uL (ref 0.7–4.0)
MCH: 33.7 pg (ref 26.0–34.0)
MCHC: 32.8 g/dL (ref 30.0–36.0)
MCV: 102.7 fL — ABNORMAL HIGH (ref 80.0–100.0)
Monocytes Absolute: 0.4 10*3/uL (ref 0.1–1.0)
Monocytes Relative: 9 %
Neutro Abs: 2.6 10*3/uL (ref 1.7–7.7)
Neutrophils Relative %: 60 %
Platelet Count: 182 10*3/uL (ref 150–400)
RBC: 2.97 MIL/uL — ABNORMAL LOW (ref 4.22–5.81)
RDW: 12.3 % (ref 11.5–15.5)
WBC Count: 4.4 10*3/uL (ref 4.0–10.5)
nRBC: 0 % (ref 0.0–0.2)

## 2021-02-15 LAB — IRON AND IRON BINDING CAPACITY (CC-WL,HP ONLY)
Iron: 83 ug/dL (ref 45–182)
Saturation Ratios: 21 % (ref 17.9–39.5)
TIBC: 393 ug/dL (ref 250–450)
UIBC: 310 ug/dL

## 2021-02-15 LAB — CMP (CANCER CENTER ONLY)
ALT: 22 U/L (ref 0–44)
AST: 49 U/L — ABNORMAL HIGH (ref 15–41)
Albumin: 4.1 g/dL (ref 3.5–5.0)
Alkaline Phosphatase: 37 U/L — ABNORMAL LOW (ref 38–126)
Anion gap: 6 (ref 5–15)
BUN: 17 mg/dL (ref 8–23)
CO2: 25 mmol/L (ref 22–32)
Calcium: 8.7 mg/dL — ABNORMAL LOW (ref 8.9–10.3)
Chloride: 108 mmol/L (ref 98–111)
Creatinine: 0.57 mg/dL — ABNORMAL LOW (ref 0.61–1.24)
GFR, Estimated: >60 mL/min (ref >60)
Glucose, Bld: 100 mg/dL — ABNORMAL HIGH (ref 70–99)
Potassium: 4 mmol/L (ref 3.5–5.1)
Sodium: 139 mmol/L (ref 135–145)
Total Bilirubin: 1.1 mg/dL (ref 0.3–1.2)
Total Protein: 6.4 g/dL — ABNORMAL LOW (ref 6.5–8.1)

## 2021-02-15 LAB — VITAMIN B12: Vitamin B-12: 1066 pg/mL — ABNORMAL HIGH (ref 180–914)

## 2021-02-15 LAB — FERRITIN: Ferritin: 28 ng/mL (ref 24–336)

## 2021-02-15 NOTE — Telephone Encounter (Signed)
Patient reports he stopped taking Tylenol recently due to recent elevations in LFTs. He has therefore been using more tramadol lately for his knee pain (awaiting TKA) and is going to run out soon. He requests refill for tramadol.  Last Rx 11/18/20: Tramadol 50 mg - 1 tablet q4h PRN #30, 0 refills.  Preferred pharmacy: [facility] [address], [place] - [place] [address] Phone: [phone] Fax: [phone]  Forwarding request to PCP.

## 2021-02-15 NOTE — Progress Notes (Signed)
Chronic Care Management Pharmacy Note  02/15/2021 Name:  Gregory [name] MRN:  [id number] DOB:  24-Mar-1950  Summary: CCM f/u visit -Pt is undergoing pre-op evaluation for TKA with cardiology, hematology. Currently working on replacing B12 -Pt reports Delene Loll is cost-prohibitive, he did not receive pt assistance forms in mail  Recommendations/Changes made from today's visit: -Mail Entresto [employer]) assistance forms to patient. He will bring these to cardiology office to complete enrollment.  Follow up: -[place] will call patient 1 month to f/u Entresto PAP -Pharmacist follow up televisit scheduled for 6 months -Nurse AWV is scheduled for 12/2021   Subjective: Gregory [name] is an 70 y.o. year old male who is a primary patient of Bedsole, Amy E, MD.  The CCM team was consulted for assistance with disease management and care coordination needs.    Engaged with patient by telephone for follow up visit in response to provider referral for pharmacy case management and/or care coordination services.   Consent to Services:  The patient was given information about Chronic Care Management services, agreed to services, and gave verbal consent prior to initiation of services.  Please see initial visit note for detailed documentation.   Patient Care Team: Jinny Sanders, MD as PCP - General (Family Medicine) [name], MD as PCP - Cardiology (Cardiology) Vickie Epley, MD as PCP - Electrophysiology (Cardiology) Lendon Colonel, NP as Nurse Practitioner (Cardiology) Debbora Dus, [facility] as Pharmacist (Pharmacist)    Recent office visits:  01/05/21 Dr Diona Browner OV: pre-op exam for TKA. Needs cardiology risk stratification.  09/15/2020 - [name], MD - Video Visit - Patient presented for iron deficiency anemia. Labs: CBC, IBC + Ferritin. Referral to GI and hematology.  Recent consult visits:  01/26/20 Dr Quentin Ore (Cardiology): f/u CHF. NYHA II symptoms. No  changes. 01/05/21 Dr Percival Spanish (cardiology): f/u AVR. Increased metoprolol from 12.5 mg daily to 25 mg BID. 11/21/20 Dr Lindi Adie (heme/onc): f/u IDA. 11/15/20 Dr Burt Knack (cardology): f/u aortic valve insufficiency. NYHA 1 symptoms. Repeat ECHO in 6 months. 11/02/20 PA Angelena Form (cardiology): f/u s/p BAV. Asymptomatic, no med changes.  Hospital visits:  Medication Reconciliation was completed by comparing discharge summary, patients EMR and Pharmacy list, and upon discussion with patient.   Admitted to the hospital on 10/17/2020 due to balloon aortic valve valvuloplasty and BIV pacemaker insertion CRT-P. Discharge date was 10/21/2020. Discharged from [place]?Medications Started at [facility] Discharge:?? -started metoprolol succinate (TOPROL-XL) 25 MG 24 hr tablet   Admitted to the hospital on 10/10/2020 for TEE. Aortic arch angiography was performed. No medication changes  Admitted to the hospital on 09/28/2020 due to Congestive Heart Failure. Discharge date was 10/02/2020. Discharged from [place]?Medications Started at [facility] Discharge:?? -started potassium chloride SA (KLOR-CON) 20 MEQ tablet -started sacubitril-valsartan (ENTRESTO) 49-51 MG -started spironolactone (ALDACTONE) 25 MG tablet   Medication Changes at Hospital Discharge: -Changed furosemide (LASIX) 20 MG tablet-Take 2 tablets in the morning and 1 tablet in the afternoon   Medications Discontinued at Hospital Discharge: -Stopped Losartan 3m -Stopped Metoprolol Tartate 254m-Stopped Tramadol 5033m Medications that remain the same after Hospital Discharge:??  -All other medications will remain the same.       Objective:  Lab Results  Component Value Date   CREATININE 0.57 (L) 02/15/2021   BUN 17 02/15/2021   GFR 86.77 03/21/2020   GFRNONAA >60 02/15/2021   GFRAA >60 03/17/2019   NA  139 02/15/2021   K 4.0 02/15/2021   CALCIUM 8.7 (L) 02/15/2021    CO2 25 02/15/2021   GLUCOSE 100 (H) 02/15/2021    Lab Results  Component Value Date/Time   HGBA1C 4.5 (L) 10/18/2020 03:32 AM   HGBA1C 4.5 (L) 09/09/2020 09:21 AM   GFR 86.77 03/21/2020 08:05 AM   GFR 86.04 12/18/2019 07:53 AM    Last diabetic Eye exam:  Lab Results  Component Value Date/Time   HMDIABEYEEXA No Retinopathy 10/16/2017 12:00 AM    Last diabetic Foot exam:  Lab Results  Component Value Date/Time   HMDIABFOOTEX done 01/09/2018 12:00 AM     Lab Results  Component Value Date   CHOL 151 03/21/2020   HDL 67.70 03/21/2020   LDLCALC 63 03/21/2020   LDLDIRECT 151.0 01/05/2016   TRIG 98.0 03/21/2020   CHOLHDL 2 03/21/2020    Hepatic Function Latest Ref Rng & Units 02/15/2021 01/13/2021 11/17/2020  Total Protein 6.5 - 8.1 g/dL 6.4(L) 6.5 6.6  Albumin 3.5 - 5.0 g/dL 4.1 4.2 4.1  AST 15 - 41 U/L 49(H) 50(H) 49(H)  ALT 0 - 44 U/L 22 17 16   Alk Phosphatase 38 - 126 U/L 37(L) 40 47  Total Bilirubin 0.3 - 1.2 mg/dL 1.1 1.3(H) 1.3(H)  Bilirubin, Direct 0.00 - 0.40 mg/dL - - -    Lab Results  Component Value Date/Time   TSH 1.620 09/28/2020 04:38 PM   TSH 2.560 08/14/2018 12:09 PM   TSH 4.14 01/11/2015 11:11 AM   FREET4 0.80 (L) 08/14/2018 12:09 PM    CBC Latest Ref Rng & Units 02/15/2021 02/13/2021 01/16/2021  WBC 4.0 - 10.5 K/uL 4.4 4.8 4.9  Hemoglobin 13.0 - 17.0 g/dL 10.0(L) 10.0(L) 9.2(L)  Hematocrit 39.0 - 52.0 % 30.5(L) 30.4(L) 27.0(L)  Platelets 150 - 400 K/uL 182 187 165    Lab Results  Component Value Date/Time   VD25OH 41.22 03/21/2020 08:05 AM   VD25OH 39.30 12/18/2019 07:53 AM    Clinical ASCVD: Yes  The ASCVD Risk score (Arnett DK, et al., 2019) failed to calculate for the following reasons:   The patient has a prior MI or stroke diagnosis    Depression screen [facility] 2/9 12/20/2020 12/18/2019 07/20/2019  Decreased Interest 0 0 0  Down, Depressed, Hopeless 0 0 0  PHQ - 2 Score 0 0 0  Altered sleeping - 0 -  Tired, decreased energy - 0 -  Change  in appetite - 0 -  Feeling bad or failure about yourself  - 0 -  Trouble concentrating - 0 -  Moving slowly or fidgety/restless - 0 -  Suicidal thoughts - 0 -  PHQ-9 Score - 0 -  Difficult doing work/chores - Not difficult at all -  Some recent data might be hidden    Social History   Tobacco Use  Smoking Status Never  Smokeless Tobacco Never   BP Readings from Last 3 Encounters:  02/13/21 (!) 124/54  01/26/21 113/72  01/25/21 108/68   Pulse Readings from Last 3 Encounters:  02/13/21 72  01/26/21 70  01/25/21 72   Wt Readings from Last 3 Encounters:  02/13/21 221 lb 6.4 oz (100.4 kg)  01/25/21 220 lb (99.8 kg)  01/16/21 222 lb 8 oz (100.9 kg)   BMI Readings from Last 3 Encounters:  02/13/21 35.20 kg/m  01/25/21 34.98 kg/m  01/16/21 35.91 kg/m    Assessment/Interventions: Review of patient past medical history, allergies, medications, health status, including review of consultants reports, laboratory and other  test data, was performed as part of comprehensive evaluation and provision of chronic care management services.   SDOH:  (Social Determinants of Health) assessments and interventions performed: Yes   SDOH Screenings   Alcohol Screen: Low Risk    Last Alcohol Screening Score (AUDIT): 2  Depression (PHQ2-9): Low Risk    PHQ-2 Score: 0  Financial Resource Strain: Low Risk    Difficulty of Paying Living Expenses: Not very hard  Food Insecurity: No Food Insecurity   Worried About Charity fundraiser in the Last Year: Never true   Ran Out of Food in the Last Year: Never true  Housing: Low Risk    Last Housing Risk Score: 0  Physical Activity: Sufficiently Active   Days of Exercise per Week: 5 days   Minutes of Exercise per Session: 150+ min  Social Connections: Moderately Isolated   Frequency of Communication with Friends and Family: Once a week   Frequency of Social Gatherings with Friends and Family: Three times a week   Attends Religious Services:  Never   Active Member of Clubs or Organizations: No   Attends Archivist Meetings: Never   Marital Status: Married  Stress: No Stress Concern Present   Feeling of Stress : Not at all  Tobacco Use: Low Risk    Smoking Tobacco Use: Never   Smokeless Tobacco Use: Never   Passive Exposure: Not on file  Transportation Needs: No Transportation Needs   Lack of Transportation (Medical): No   Lack of Transportation (Non-Medical): No    CCM Care Plan  Allergies  Allergen Reactions   Heparin     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 Sig Documenting Provider Last Dose Status Informant  acetaminophen (TYLENOL) 500 MG tablet [id number] Yes Take 1,000 mg by mouth every 6 (six) hours as needed for moderate pain. [provider] Taking Active Self  allopurinol (ZYLOPRIM) 100 MG tablet [id number] Yes Take 1 tablet by mouth once daily Bedsole, Amy E, MD Taking Active   ALPRAZolam (XANAX) 0.5 MG tablet [id number] Yes TAKE ONE TABLET BY MOUTH ONCE DAILY AS NEEDED FOR ANIXETY. TAKE 30 MINUTES PRIOR TO PLANE FLIGHT OR AS NEEDED FOR ANXIETY Bedsole, Amy E, MD Taking Active   aspirin EC 81 MG tablet [id number] Yes Take 1 tablet (81 mg total) by mouth daily. Swallow whole. [name], MD Taking Active Self  atorvastatin (LIPITOR) 80 MG tablet [id number] Yes Take 1 tablet by mouth once daily Diona Browner, Amy E, MD Taking Active   Biotin 10 MG CAPS [id number] Yes Take by mouth. [provider] Taking Active   colchicine 0.6 MG tablet [id number] Yes Take 2 tablets by mouth once daily Bedsole, Amy E, MD Taking Active   furosemide (LASIX) 20 MG tablet [id number] Yes Take 2 tablets in the morning and 1 tablet in the afternoon Almyra Deforest, [place] Taking Active Self  ketoconazole (NIZORAL) 2 % shampoo [id number] Yes Apply 1 application topically 2 (two)  times a week. Jinny Sanders, MD Taking Active   latanoprost (XALATAN) 0.005 % ophthalmic solution [id number] Yes Place 1 drop into both eyes every morning.  [provider] Taking Active Self  metoprolol succinate (TOPROL-XL) 25 MG 24 hr tablet [id number] Yes Take 1 tablet (25 mg total) by mouth 2 (two) times daily. [name], MD Taking Active  Misc Natural Products (OSTEO BI-FLEX/5-LOXIN ADVANCED) TABS [id number] Yes Take 2 tablets by mouth daily. [provider] Taking Active Self  potassium chloride SA (KLOR-CON) 20 MEQ tablet [id number] Yes Take 1 tablet (20 mEq total) by mouth daily. Almyra Deforest, [place] Taking Active Self  sacubitril-valsartan (ENTRESTO) 49-51 MG [id number] Yes Take 1 tablet by mouth 2 (two) times daily. Almyra Deforest, [place] Taking Active Self  sertraline (ZOLOFT) 50 MG tablet [id number] Yes Take 1 tablet by mouth once daily Bedsole, Amy E, MD Taking Active   spironolactone (ALDACTONE) 25 MG tablet [id number] Yes Take 1 tablet (25 mg total) by mouth daily. Almyra Deforest, [place] Taking Active Self  traMADol (ULTRAM) 50 MG tablet [id number] Yes TAKE 1 TABLET BY MOUTH EVERY 4 HOURS AS NEEDED FOR PAIN Bedsole, Amy E, MD Taking Active   traZODone (DESYREL) 50 MG tablet [id number] Yes Take 0.5-1 tablets (25-50 mg total) by mouth at bedtime as needed for sleep.  Patient taking differently: Take 50 mg by mouth at bedtime as needed for sleep.   Jinny Sanders, MD Taking Active             Patient Active Problem List   Diagnosis Date Noted   Vitamin B 12 deficiency 01/16/2021   Pre-op evaluation 01/05/2021   ICD (implantable cardioverter-defibrillator) in place    S/p aortic valvuloplasty 10/18/2020   Complete heart block ([place]) 10/17/2020   Paravalvular leak (prosthetic valve) 10/10/2020   Severe aortic insufficiency 10/10/2020   Heart block AV complete ([place])    Iron deficiency anemia 09/22/2020   Chronic gout of multiple sites 03/18/2020   Dilated cardiomyopathy ([place])  03/13/2020   Dyslipidemia 03/13/2020   Coronary artery disease involving native coronary artery of native heart without angina pectoris 12/12/2019   Pain due to onychomycosis of toenails of both feet 04/23/2019   S/P CABG x 3 04/15/2019   GAD (generalized anxiety disorder) 03/31/2019   Seborrheic dermatitis 03/31/2019   History of heparin-induced thrombocytopenia 03/23/2019   Long term (current) use of anticoagulants 03/23/2019   Atrial fibrillation ([place]) 03/23/2019   S/P aortic valve replacement 03/06/2019   Prediabetes 11/18/2018   Vitamin D deficiency 09/30/2018   Lymphedema 01/13/2018   Grade I diastolic dysfunction 88/87/5797   Situational anxiety 10/30/2016   Bilateral primary osteoarthritis of knee 09/23/2015   Anemia 08/06/2013   Class 1 drug-induced obesity with serious comorbidity and body mass index (BMI) of 34.0 to 34.9 in adult 03/26/2011   Aortic insufficiency 02/15/2011   Essential hypertension 10/09/2006    Immunization History  Administered Date(s) Administered   Fluad Quad(high Dose 65+) 12/22/2019, 01/05/2021   Influenza,inj,Quad PF,6+ Mos 11/11/2018   Moderna Sars-Covid-2 Vaccination 01/27/2019, 02/24/2019, 10/31/2019   Pneumococcal Conjugate-13 01/11/2015   Pneumococcal Polysaccharide-23 04/17/2016   Tdap 02/25/2012    Conditions to be addressed/monitored:  Hypertension, Hyperlipidemia, Heart Failure, Coronary Artery Disease, Anxiety, and Osteoarthritis  Care Plan : [place]  Updates made by Charlton Haws, [place] since 02/15/2021 12:00 AM     Problem: Hypertension, Hyperlipidemia, Heart Failure, Coronary Artery Disease, Anxiety, and Osteoarthritis   Priority: High     Long-Range Goal: Disease Management   Start Date: 11/01/2020  Expected End Date: 02/15/2022  This Visit's Progress: On track  Priority: High  Note:   Current Barriers:  Unable to independently afford treatment regimen Delene Loll cost   Pharmacist Clinical Goal(s):   Patient will contact provider office for questions/concerns as evidenced notation of same in electronic health record through collaboration  with PharmD and provider.   Interventions: 1:1 collaboration with Jinny Sanders, MD regarding development and update of comprehensive plan of care as evidenced by provider attestation and co-signature Inter-disciplinary care team collaboration (see longitudinal plan of care) Comprehensive medication review performed; medication list updated in electronic medical record  Hypertension/CHF (BP goal <130/80) -Controlled - per clinic BP readings; pt denies issues at home -TEE EF 30-35% (10/10/20) Pt aware of CHF diagnosis, denies concerns with new meds other than Entresto cost - he did not receive Entresto PAP in the mail -Current treatment: Entresto 49-51 mg BID - Appropriate, Effective, Safe, Query Accessible Metoprolol succinate 25 mg - 1/2 tablet daily - Appropriate, Effective, Safe, Accessible Spironolactone 25 mg daily -Appropriate, Effective, Safe, Accessible Potassium chloride SA 20 mEq daily -Appropriate, Effective, Safe, Accessible Furosemide 20 mg - 2 tab AM, 1 tab PM -Appropriate, Effective, Safe, Accessible -Medications previously tried: none   -Educated on Daily salt intake goal < 2300 mg; Importance of home blood pressure monitoring; -Counseled to monitor BP at home periodically -Recommended to continue current medication; mail Entresto PAP to patient, he will bring to cardiologist to complete  Hyperlipidemia/CAD: (LDL goal < 70) -Controlled - LDL 63 -Hx CAD - CABG/AVR 03/2019. S/p balloon valvuloplasty and [employer] CRT-D 10/2020 -Current treatment: Atorvastatin 80 mg daily -Appropriate, Effective, Safe, Accessible Aspirin 81 mg daily -Appropriate, Effective, Safe, Accessible -Medications previously tried: none  -Educated on Cholesterol goals;  -Recommended to continue current medication  Depression/Anxiety (Goal: Improve  mood) -Controlled - per pt report -PHQ9: 0 (12/18/19) - minimal depression -GAD7: not on file -Current treatment: Sertraline 50 mg daily -Appropriate, Effective, Safe, Accessible Trazodone 50 mg - 1/2-1 tablet PRN sleep (rare use - causes bad dreams) -Appropriate, Effective, Safe, Accessible Alprazolam 0.5 mg - 1 tablet PRN daily (only takes for claustrophobia, rare anxiety, procedures) -Appropriate, Effective, Safe, Accessible -Medications previously tried/failed: none reported -Educated on Benefits of medication for symptom control -Recommended to continue current medication  Osteoarthritis (Goal: manage pain) -Not ideally controlled - pt reports he recently stopped Tylenol due to recent LFT elevations; he is undergoing pre-op evaluation for upcoming TKA; he reports he is almost out of tramadol -Current treatment  Tramadol 50 mg q4h PRN -Coordinate with PCP for tramadol refill  Health Maintenance -Vaccine gaps: Shingrix, covid booster -Pt reports he recently started biotin OTC. Discussed biotin can interfere with some lab results, it is a good idea to hold it for a few days before labwork.  Patient Goals/Self-Care Activities Patient will:  - take medications as prescribed as evidenced by patient report and record review focus on medication adherence by routine check blood pressure periodically, document, and provide at future appointments collaborate with provider on medication access solutions (bring Entresto forms to cardiology office)       Medication Assistance:  Unable to afford Entresto at $47/month ; States cardiology office does not have any samples. He was given a coupon for first fill. He would like information mailed about Entresto PAP.   Compliance/Adherence/Medication fill history: Care Gaps: None  Star-Rating Drugs: Medication:                                        Last Fill:           Day Supply Atorvastatin 70m  01/09/21        90 PDC 92%  Patient's preferred pharmacy is:  [facility] [address], [place] - [place] [address] Phone: [phone] Fax: [phone]   Uses pill box? Yes - fills based on list from discharge summary from 10/21/20 Pt endorses 100% compliance - very organized, taking medications routinely   We discussed: Benefits of medication synchronization, packaging and delivery as well as enhanced pharmacist oversight with [employer]. Patient decided to: Continue current medication management strategy  Care Plan and Follow Up Patient Decision:  Patient agrees to Care Plan and Follow-up.  Follow Up Plan: Telephone follow up appointment with care management team member scheduled for: 6 months  [name], PharmD, BCACP Clinical Pharmacist [place] [facility] at [facility] [phone]

## 2021-02-15 NOTE — Patient Instructions (Signed)
Visit Information  Phone number for Pharmacist: [phone]   Goals Addressed             This Visit's Progress    Manage My Medicine       Timeframe:  Long-Range Goal Priority:  Medium Start Date:       02/15/21                      Expected End Date:   02/15/22                    Follow Up Date Aug 2023   - call for medicine refill 2 or 3 days before it runs out - call if I am sick and can't take my medicine - keep a list of all the medicines I take; vitamins and herbals too - use a pillbox to sort medicine    Why is this important?   These steps will help you keep on track with your medicines.   Notes:         Care Plan : [place]  Updates made by Charlton Haws, RPH since 02/15/2021 12:00 AM     Problem: Hypertension, Hyperlipidemia, Heart Failure, Coronary Artery Disease, Anxiety, and Osteoarthritis   Priority: High     Long-Range Goal: Disease Management   Start Date: 11/01/2020  Expected End Date: 02/15/2022  This Visit's Progress: On track  Priority: High  Note:   Current Barriers:  Unable to independently afford treatment regimen Gregory [name] cost   Pharmacist Clinical Goal(s):  Patient will contact provider office for questions/concerns as evidenced notation of same in electronic health record through collaboration with PharmD and provider.   Interventions: 1:1 collaboration with Jinny Sanders, MD regarding development and update of comprehensive plan of care as evidenced by provider attestation and co-signature Inter-disciplinary care team collaboration (see longitudinal plan of care) Comprehensive medication review performed; medication list updated in electronic medical record  Hypertension/CHF (BP goal <130/80) -Controlled - per clinic BP readings; pt denies issues at home -TEE EF 30-35% (10/10/20) Pt aware of CHF diagnosis, denies concerns with new meds other than Entresto cost.  -Current treatment: Entresto 49-51 mg BID -  Appropriate, Effective, Safe, Query Accessible Metoprolol succinate 25 mg - 1/2 tablet daily - Appropriate, Effective, Safe, Accessible Spironolactone 25 mg daily -Appropriate, Effective, Safe, Accessible Potassium chloride SA 20 mEq daily -Appropriate, Effective, Safe, Accessible Furosemide 20 mg - 2 tab AM, 1 tab PM -Appropriate, Effective, Safe, Accessible -Medications previously tried: none   -Educated on Daily salt intake goal < 2300 mg; Importance of home blood pressure monitoring; -Counseled to monitor BP at home periodically -Recommended to continue current medication  Hyperlipidemia/CAD: (LDL goal < 70) -Controlled - LDL 63 -Hx CAD - CABG/AVR 03/2019. S/p balloon valvuloplasty and [employer] CRT-D 10/2020 -Current treatment: Atorvastatin 80 mg daily -Appropriate, Effective, Safe, Accessible Aspirin 81 mg daily -Appropriate, Effective, Safe, Accessible -Medications previously tried: none  -Educated on Cholesterol goals;  -Recommended to continue current medication  Depression/Anxiety (Goal: Improve mood) -Controlled - per pt report -PHQ9: 0 (12/18/19) - minimal depression -GAD7: not on file -Current treatment: Sertraline 50 mg daily -Appropriate, Effective, Safe, Accessible Trazodone 50 mg - 1/2-1 tablet PRN sleep (rare use - causes bad dreams) -Appropriate, Effective, Safe, Accessible Alprazolam 0.5 mg - 1 tablet PRN daily (only takes for claustrophobia, rare anxiety, procedures) -Appropriate, Effective, Safe, Accessible -Medications previously tried/failed: none reported -Educated on Benefits of medication for symptom  control -Recommended to continue current medication  Osteoarthritis (Goal: manage pain) -Not ideally controlled - pt reports he recently stopped Tylenol due to recent LFT elevations; he is undergoing pre-op evaluation for upcoming TKA; he reports he is almost out of tramadol -Current treatment  Tramadol 50 mg q4h PRN -Coordinate with PCP for tramadol  refill  Health Maintenance -Vaccine gaps: Shingrix, covid booster -Pt reports he recently started biotin OTC. Discussed biotin can interfere with some lab results, it is a good idea to hold it for a few days before labwork.  Patient Goals/Self-Care Activities Patient will:  - take medications as prescribed as evidenced by patient report and record review focus on medication adherence by routine check blood pressure periodically, document, and provide at future appointments collaborate with provider on medication access solutions (bring Entresto forms to cardiology office)       Patient verbalizes understanding of instructions and care plan provided today and agrees to view in [place]. Active MyChart status confirmed with patient.   Telephone follow up appointment with pharmacy team member scheduled for: 6 months  [name], PharmD, [facility] Clinical Pharmacist [place] [facility] at [facility] [phone]

## 2021-02-16 MED ORDER — TRAMADOL HCL 50 MG PO TABS
50.0000 mg | ORAL_TABLET | ORAL | 0 refills | Status: DC | PRN
Start: 2021-02-16 — End: 2021-06-07

## 2021-02-16 NOTE — Telephone Encounter (Signed)
Call  Let pt know I have sent in tramadol prescription.    PDMP reviewed during this encounter.

## 2021-02-16 NOTE — Telephone Encounter (Signed)
Patient notified rx was sent 

## 2021-02-18 LAB — ERYTHROPOIETIN: Erythropoietin: 46.2 m[IU]/mL — ABNORMAL HIGH (ref 2.6–18.5)

## 2021-02-19 ENCOUNTER — Other Ambulatory Visit: Payer: Self-pay | Admitting: Family Medicine

## 2021-02-20 ENCOUNTER — Inpatient Hospital Stay: Payer: Medicare HMO

## 2021-02-20 ENCOUNTER — Other Ambulatory Visit: Payer: Self-pay

## 2021-02-20 ENCOUNTER — Ambulatory Visit: Payer: Medicare HMO

## 2021-02-20 DIAGNOSIS — E538 Deficiency of other specified B group vitamins: Secondary | ICD-10-CM | POA: Diagnosis not present

## 2021-02-20 MED ORDER — CYANOCOBALAMIN 1000 MCG/ML IJ SOLN: 1000.0000 ug | Freq: Once | INTRAMUSCULAR | Status: DC

## 2021-02-20 MED ORDER — CYANOCOBALAMIN 1000 MCG/ML IJ SOLN
1000.0000 ug | Freq: Once | INTRAMUSCULAR | Status: AC
  Administered 2021-02-20: 1000 ug via INTRAMUSCULAR
  Filled 2021-02-20: qty 1

## 2021-02-20 NOTE — Telephone Encounter (Signed)
E-scribed refill.  Pt had wellness on 12/20/20.  Plz schedule lab and cpe visits.

## 2021-02-27 ENCOUNTER — Inpatient Hospital Stay: Payer: Medicare HMO

## 2021-02-27 ENCOUNTER — Ambulatory Visit: Payer: Medicare HMO

## 2021-02-27 ENCOUNTER — Other Ambulatory Visit: Payer: Self-pay

## 2021-02-27 DIAGNOSIS — E538 Deficiency of other specified B group vitamins: Secondary | ICD-10-CM

## 2021-02-27 MED ORDER — CYANOCOBALAMIN 1000 MCG/ML IJ SOLN
1000.0000 ug | Freq: Once | INTRAMUSCULAR | Status: AC
  Administered 2021-02-27: 1000 ug via INTRAMUSCULAR
  Filled 2021-02-27: qty 1

## 2021-02-28 DIAGNOSIS — E785 Hyperlipidemia, unspecified: Secondary | ICD-10-CM | POA: Diagnosis not present

## 2021-02-28 DIAGNOSIS — I10 Essential (primary) hypertension: Secondary | ICD-10-CM

## 2021-02-28 DIAGNOSIS — I2510 Atherosclerotic heart disease of native coronary artery without angina pectoris: Secondary | ICD-10-CM

## 2021-03-07 ENCOUNTER — Telehealth: Payer: Self-pay

## 2021-03-07 NOTE — Progress Notes (Addendum)
? ? ?  Chronic Care Management ?Pharmacy Assistant  ? ?Name: Gregory [name]  MRN: [id number] DOB: May 19, 1950 ? ?Reason for Encounter: CCM Delene Loll 2023 PAP) ?  ?Called patient to follow up in regards to his [facility] patient assistance with Cardiology. Patient was mailed the forms and he was going to take them back to Cardiology who would submit them.  ?No answer; left message. ? ?[name], CPP notified ? ?Marijean Niemann, RMA ?Clinical Pharmacy Assistant ?[phone] ? ? ?

## 2021-03-15 NOTE — Progress Notes (Signed)
? ?Patient Care Team: ?Jinny Sanders, MD as PCP - General (Family Medicine) ?[name], MD as PCP - Cardiology (Cardiology) ?Vickie Epley, MD as PCP - Electrophysiology (Cardiology) ?Lendon Colonel, NP as Nurse Practitioner (Cardiology) ?Debbora Dus, [facility] as Pharmacist (Pharmacist) ? ?DIAGNOSIS:  ?Encounter Diagnosis  ?Name Primary?  ? Iron deficiency anemia, unspecified iron deficiency anemia type   ? ?  ? ?CHIEF COMPLIANT: : Follow-up of heparin-induced thrombocytopenia and chronic macrocytic anemia ? ?INTERVAL HISTORY: Gregory [name] is a 71 y.o. with above-mentioned history of heparin-induced thrombocytopenia and chronic macrocytic anemia. He presents to the clinic today for follow-up.  He continues to have pain in his right knee and planning to undergo surgery with Dr. Lyla Glassing but his hemoglobin is not adequate for surgery and therefore he is coming in for B12 injections to see if he can make an improvement.  Overall he feels pretty good.  Denies any fatigue.  His hemoglobin today is 10.3. ? ?ALLERGIES:  is allergic to heparin. ? ?MEDICATIONS:  ?Current Outpatient Medications  ?Medication Sig Dispense Refill  ? acetaminophen (TYLENOL) 500 MG tablet Take 1,000 mg by mouth every 6 (six) hours as needed for moderate pain.    ? allopurinol (ZYLOPRIM) 100 MG tablet Take 1 tablet by mouth once daily 90 tablet 1  ? ALPRAZolam (XANAX) 0.5 MG tablet TAKE ONE TABLET BY MOUTH ONCE DAILY AS NEEDED FOR ANIXETY. TAKE 30 MINUTES PRIOR TO PLANE FLIGHT OR AS NEEDED FOR ANXIETY 20 tablet 0  ? aspirin EC 81 MG tablet Take 1 tablet (81 mg total) by mouth daily. Swallow whole. 30 tablet 11  ? atorvastatin (LIPITOR) 80 MG tablet Take 1 tablet by mouth once daily 90 tablet 0  ? Biotin 10 MG CAPS Take by mouth.    ? colchicine 0.6 MG tablet Take 2 tablets by mouth once daily 60 tablet 0  ? furosemide (LASIX) 20 MG tablet Take 2 tablets in the morning and 1 tablet in the afternoon 90 tablet 3  ? ketoconazole  (NIZORAL) 2 % shampoo Apply 1 application topically 2 (two) times a week. 120 mL 1  ? latanoprost (XALATAN) 0.005 % ophthalmic solution Place 1 drop into both eyes every morning.     ? metoprolol succinate (TOPROL-XL) 25 MG 24 hr tablet Take 1 tablet (25 mg total) by mouth 2 (two) times daily. 180 tablet 3  ? Misc Natural Products (OSTEO BI-FLEX/5-LOXIN ADVANCED) TABS Take 2 tablets by mouth daily.    ? potassium chloride SA (KLOR-CON) 20 MEQ tablet Take 1 tablet (20 mEq total) by mouth daily. 90 tablet 2  ? sacubitril-valsartan (ENTRESTO) 49-51 MG Take 1 tablet by mouth 2 (two) times daily. 60 tablet 5  ? sertraline (ZOLOFT) 50 MG tablet Take 1 tablet by mouth once daily 90 tablet 0  ? spironolactone (ALDACTONE) 25 MG tablet Take 1 tablet (25 mg total) by mouth daily. 90 tablet 3  ? traMADol (ULTRAM) 50 MG tablet Take 1 tablet (50 mg total) by mouth every 4 (four) hours as needed. for pain 30 tablet 0  ? traZODone (DESYREL) 50 MG tablet Take 0.5-1 tablets (25-50 mg total) by mouth at bedtime as needed for sleep. (Patient taking differently: Take 50 mg by mouth at bedtime as needed for sleep.) 30 tablet 3  ? ?No current facility-administered medications for this visit.  ? ? ?PHYSICAL EXAMINATION: ?ECOG PERFORMANCE STATUS: 1 - Symptomatic but completely ambulatory ? ?Vitals:  ? 03/20/21 0851  ?BP: (!) 144/62  ?Pulse:  73  ?Resp: 18  ?Temp: (!) 97.5 ?F (36.4 ?C)  ?SpO2: 98%  ? ?Filed Weights  ? 03/20/21 0851  ?Weight: 223 lb 3.2 oz (101.2 kg)  ? ?COVID-19 positive test (U07.1, COVID-19) with Acute Pneumonia (J12.89, Other viral pneumonia) ?(If respiratory failure or sepsis present, add as separate assessment) ? ? ? ?LABORATORY DATA:  ?I have reviewed the data as listed ?CMP Latest Ref Rng & Units 03/20/2021 02/15/2021 01/13/2021  ?Glucose 70 - 99 mg/dL 106(H) 100(H) 87  ?BUN 8 - 23 mg/dL '17 17 19  '$ ?Creatinine 0.61 - 1.24 mg/dL 0.91 0.57(L) 0.83  ?Sodium 135 - 145 mmol/L 141 139 139  ?Potassium 3.5 - 5.1 mmol/L 4.5 4.0 4.0   ?Chloride 98 - 111 mmol/L 107 108 105  ?CO2 22 - 32 mmol/L '28 25 25  '$ ?Calcium 8.9 - 10.3 mg/dL 9.0 8.7(L) 8.8(L)  ?Total Protein 6.5 - 8.1 g/dL 6.7 6.4(L) 6.5  ?Total Bilirubin 0.3 - 1.2 mg/dL 1.3(H) 1.1 1.3(H)  ?Alkaline Phos 38 - 126 U/L 39 37(L) 40  ?AST 15 - 41 U/L 54(H) 49(H) 50(H)  ?ALT 0 - 44 U/L '22 22 17  '$ ? ? ?Lab Results  ?Component Value Date  ? WBC 4.9 03/20/2021  ? HGB 10.3 (L) 03/20/2021  ? HCT 31.6 (L) 03/20/2021  ? MCV 102.9 (H) 03/20/2021  ? PLT 185 03/20/2021  ? NEUTROABS 3.0 03/20/2021  ? ? ?ASSESSMENT & PLAN:  ?Iron deficiency anemia ?Work-up: ?TSH: 2.56: Normal ?CMP: AST 43, ALT 24, bilirubin 1.9, LDH 1004 (direct Coombs test negative) ?SPEP: No M protein ?Reticulocyte count: 3.8%, absolute reticulocyte count 122, reticulocyte hemoglobin: 23.9 (suggestive of iron deficiency or ineffective erythropoiesis) ?G18: 299, folic acid 37.1 ?Erythropoietin: 235.3 ?09/16/2020: Iron studies revealed ferritin of 14 and iron saturation of 5.3% (IV iron treatment) ?  ?Elevated LDH with normal direct Coombs test: Concern for intravascular hemolysis possibly from severe valvular heart disease ?  ?11/17/2020: Hemoglobin 9.6, WBC 3.3, platelets 142, ANC 1.9, ferritin 158, iron saturation 31% (improv from 5%) ?01/13/21: Hb 9.9, WBC: 5.5, MCV 105.6, Platelet 165, TB 1.3, Ferritin 50, Iron Sat: 25%,  ?01/16/2021: Hemoglobin 9.2, MCV 105.1, B12 117 (start B12 injections) ?02/13/2021: Hemoglobin 10, MCV 103.1,   ?03/20/2021: Hemoglobin 10.3, MCV 102.9 ?  ?Knee replacement surgery: Needs a hemoglobin of 11.  I sent a message to Dr. Lyla Glassing that it is unlikely that his hemoglobin is going to recover to 11 anytime soon and therefore he could proceed with surgery with blood as backup. ?  ?Return to clinic in 1 month with labs and injection appointments and follow-up with me in 3 months. ?He quit drinking alcohol as well. ? ? ? ?No orders of the defined types were placed in this encounter. ? ?The patient has a good  understanding of the overall plan. he agrees with it. he will call with any problems that may develop before the next visit here. ?Total time spent: 30 mins including face to face time and time spent for planning, charting and co-ordination of care ? ? Harriette Ohara, MD ?03/20/21 ? ? ? I, Gardiner Coins, am acting as a scribe for Dr. Lindi Adie  ?

## 2021-03-20 ENCOUNTER — Inpatient Hospital Stay: Payer: Medicare HMO

## 2021-03-20 ENCOUNTER — Inpatient Hospital Stay: Payer: Medicare HMO | Attending: Hematology and Oncology | Admitting: Hematology and Oncology

## 2021-03-20 ENCOUNTER — Other Ambulatory Visit: Payer: Self-pay

## 2021-03-20 ENCOUNTER — Ambulatory Visit: Payer: Medicare HMO

## 2021-03-20 DIAGNOSIS — E538 Deficiency of other specified B group vitamins: Secondary | ICD-10-CM

## 2021-03-20 DIAGNOSIS — D509 Iron deficiency anemia, unspecified: Secondary | ICD-10-CM

## 2021-03-20 DIAGNOSIS — Z79899 Other long term (current) drug therapy: Secondary | ICD-10-CM | POA: Diagnosis not present

## 2021-03-20 LAB — CMP (CANCER CENTER ONLY)
ALT: 22 U/L (ref 0–44)
AST: 54 U/L — ABNORMAL HIGH (ref 15–41)
Albumin: 4.3 g/dL (ref 3.5–5.0)
Alkaline Phosphatase: 39 U/L (ref 38–126)
Anion gap: 6 (ref 5–15)
BUN: 17 mg/dL (ref 8–23)
CO2: 28 mmol/L (ref 22–32)
Calcium: 9 mg/dL (ref 8.9–10.3)
Chloride: 107 mmol/L (ref 98–111)
Creatinine: 0.91 mg/dL (ref 0.61–1.24)
GFR, Estimated: >60 mL/min (ref >60)
Glucose, Bld: 106 mg/dL — ABNORMAL HIGH (ref 70–99)
Potassium: 4.5 mmol/L (ref 3.5–5.1)
Sodium: 141 mmol/L (ref 135–145)
Total Bilirubin: 1.3 mg/dL — ABNORMAL HIGH (ref 0.3–1.2)
Total Protein: 6.7 g/dL (ref 6.5–8.1)

## 2021-03-20 LAB — CBC WITH DIFFERENTIAL (CANCER CENTER ONLY)
Abs Immature Granulocytes: 0.01 10*3/uL (ref 0.00–0.07)
Basophils Absolute: 0 10*3/uL (ref 0.0–0.1)
Basophils Relative: 0 %
Eosinophils Absolute: 0.1 10*3/uL (ref 0.0–0.5)
Eosinophils Relative: 1 %
HCT: 31.6 % — ABNORMAL LOW (ref 39.0–52.0)
Hemoglobin: 10.3 g/dL — ABNORMAL LOW (ref 13.0–17.0)
Immature Granulocytes: 0 %
Lymphocytes Relative: 29 %
Lymphs Abs: 1.4 10*3/uL (ref 0.7–4.0)
MCH: 33.6 pg (ref 26.0–34.0)
MCHC: 32.6 g/dL (ref 30.0–36.0)
MCV: 102.9 fL — ABNORMAL HIGH (ref 80.0–100.0)
Monocytes Absolute: 0.5 10*3/uL (ref 0.1–1.0)
Monocytes Relative: 9 %
Neutro Abs: 3 10*3/uL (ref 1.7–7.7)
Neutrophils Relative %: 61 %
Platelet Count: 185 10*3/uL (ref 150–400)
RBC: 3.07 MIL/uL — ABNORMAL LOW (ref 4.22–5.81)
RDW: 13.3 % (ref 11.5–15.5)
WBC Count: 4.9 10*3/uL (ref 4.0–10.5)
nRBC: 0 % (ref 0.0–0.2)

## 2021-03-20 LAB — IRON AND IRON BINDING CAPACITY (CC-WL,HP ONLY)
Iron: 76 ug/dL (ref 45–182)
Saturation Ratios: 19 % (ref 17.9–39.5)
TIBC: 398 ug/dL (ref 250–450)
UIBC: 322 ug/dL (ref 117–376)

## 2021-03-20 LAB — VITAMIN B12: Vitamin B-12: 598 pg/mL (ref 180–914)

## 2021-03-20 LAB — FERRITIN: Ferritin: 33 ng/mL (ref 24–336)

## 2021-03-20 MED ORDER — CYANOCOBALAMIN 1000 MCG/ML IJ SOLN
1000.0000 ug | Freq: Once | INTRAMUSCULAR | Status: AC
  Administered 2021-03-20: 1000 ug via INTRAMUSCULAR
  Filled 2021-03-20: qty 1

## 2021-03-20 NOTE — Assessment & Plan Note (Signed)
Work-up: ?TSH: 2.56: Normal ?CMP: AST 43, ALT 24, bilirubin 1.9, LDH 1004 (direct Coombs test negative) ?SPEP: No M protein ?Reticulocyte count: 3.8%, absolute reticulocyte count 122, reticulocyte hemoglobin: 23.9 (suggestive of iron deficiency or ineffective erythropoiesis) ?W73: 710, folic acid 62.6 ?Erythropoietin: 235.3 ?09/16/2020: Iron studies revealed ferritin of 14 and iron saturation of 5.3%?(IV iron treatment) ?? ?Elevated LDH with normal direct Coombs test: Concern for intravascular hemolysis possibly from severe valvular heart disease ?? ?11/17/2020: Hemoglobin 9.6, WBC 3.3, platelets 142, ANC 1.9, ferritin 158, iron saturation 31% (improv from 5%) ?01/13/21: Hb 9.9, WBC: 5.5, MCV 105.6, Platelet 165, TB 1.3, Ferritin 50, Iron Sat: 25%,? ?01/16/2021: Hemoglobin 9.2, MCV 105.1, B12 117 (start B12 injections) ?02/13/2021: Hemoglobin 10, MCV 103.1, B12 pending ?? ?Knee replacement surgery: Needs a hemoglobin of 11.  If in a month his hemoglobin is not back up and we may have to call his surgeon to proceed with surgery and give blood transfusion postoperatively. ?? ?Return to clinic in 1 month with labs and injection appointments and follow-up with me. ?He quit drinking alcohol as well. ?

## 2021-03-21 ENCOUNTER — Telehealth: Payer: Self-pay | Admitting: Hematology and Oncology

## 2021-03-21 LAB — ERYTHROPOIETIN: Erythropoietin: 28.3 m[IU]/mL — ABNORMAL HIGH (ref 2.6–18.5)

## 2021-03-21 NOTE — Telephone Encounter (Signed)
Scheduled appointment per 3/20 los. Left message. ?

## 2021-03-30 NOTE — Telephone Encounter (Signed)
Appt scheduled for 04/24/21 for labs and AWV for 04/27/21 ?

## 2021-03-30 NOTE — Progress Notes (Signed)
Called patient to follow up in regards to his [facility] patient assistance with Cardiology. Forms were mailed to patient who was then going to take them to Cardiology. No answer; left message.  ? ?[name], CPP notified ? ?Marijean Niemann, RMA ?Clinical Pharmacy Assistant ?[phone] ? ? ?

## 2021-04-06 ENCOUNTER — Encounter: Payer: Self-pay | Admitting: Family Medicine

## 2021-04-06 DIAGNOSIS — H5213 Myopia, bilateral: Secondary | ICD-10-CM | POA: Diagnosis not present

## 2021-04-06 LAB — HM DIABETES EYE EXAM

## 2021-04-13 ENCOUNTER — Telehealth: Payer: Self-pay | Admitting: Family Medicine

## 2021-04-13 ENCOUNTER — Other Ambulatory Visit: Payer: Self-pay | Admitting: Family Medicine

## 2021-04-13 ENCOUNTER — Other Ambulatory Visit: Payer: Self-pay | Admitting: Physician Assistant

## 2021-04-13 DIAGNOSIS — E7800 Pure hypercholesterolemia, unspecified: Secondary | ICD-10-CM

## 2021-04-13 DIAGNOSIS — R7303 Prediabetes: Secondary | ICD-10-CM

## 2021-04-13 DIAGNOSIS — E559 Vitamin D deficiency, unspecified: Secondary | ICD-10-CM

## 2021-04-13 DIAGNOSIS — M1A09X0 Idiopathic chronic gout, multiple sites, without tophus (tophi): Secondary | ICD-10-CM

## 2021-04-13 NOTE — Telephone Encounter (Signed)
-----   Message from Velna Hatchet, RT sent at 04/10/2021  9:03 AM EDT ----- ?Regarding: Lab Mon 04/24/21 ?Need lab orders for awv appt on 04/24/21, please.  Thanks,  Anda Kraft ? ?

## 2021-04-17 ENCOUNTER — Ambulatory Visit: Payer: Medicare HMO

## 2021-04-17 ENCOUNTER — Other Ambulatory Visit: Payer: Self-pay

## 2021-04-17 ENCOUNTER — Inpatient Hospital Stay: Payer: Medicare HMO | Attending: Hematology and Oncology

## 2021-04-17 DIAGNOSIS — E538 Deficiency of other specified B group vitamins: Secondary | ICD-10-CM | POA: Insufficient documentation

## 2021-04-17 MED ORDER — CYANOCOBALAMIN 1000 MCG/ML IJ SOLN
1000.0000 ug | Freq: Once | INTRAMUSCULAR | Status: AC
  Administered 2021-04-17: 1000 ug via INTRAMUSCULAR
  Filled 2021-04-17: qty 1

## 2021-04-20 ENCOUNTER — Ambulatory Visit (INDEPENDENT_AMBULATORY_CARE_PROVIDER_SITE_OTHER): Payer: Medicare HMO

## 2021-04-20 DIAGNOSIS — I255 Ischemic cardiomyopathy: Secondary | ICD-10-CM

## 2021-04-20 DIAGNOSIS — Z950 Presence of cardiac pacemaker: Secondary | ICD-10-CM | POA: Insufficient documentation

## 2021-04-20 LAB — CUP PACEART REMOTE DEVICE CHECK
Battery Remaining Longevity: 77 mo
Battery Remaining Percentage: 91 %
Battery Voltage: 2.98 V
Brady Statistic AP VP Percent: 74 %
Brady Statistic AP VS Percent: 1 %
Brady Statistic AS VP Percent: 25 %
Brady Statistic AS VS Percent: 1 %
Brady Statistic RA Percent Paced: 74 %
Date Time Interrogation Session: 20230420030026
HighPow Impedance: 55 Ohm
Implantable Lead Implant Date: 20221020
Implantable Lead Implant Date: 20221020
Implantable Lead Implant Date: 20221020
Implantable Lead Location: 753858
Implantable Lead Location: 753859
Implantable Lead Location: 753860
Implantable Pulse Generator Implant Date: 20221020
Lead Channel Impedance Value: 390 Ohm
Lead Channel Impedance Value: 390 Ohm
Lead Channel Impedance Value: 640 Ohm
Lead Channel Pacing Threshold Amplitude: 0.5 V
Lead Channel Pacing Threshold Amplitude: 1 V
Lead Channel Pacing Threshold Amplitude: 1.625 V
Lead Channel Pacing Threshold Pulse Width: 0.5 ms
Lead Channel Pacing Threshold Pulse Width: 0.5 ms
Lead Channel Pacing Threshold Pulse Width: 0.7 ms
Lead Channel Sensing Intrinsic Amplitude: 1.7 mV
Lead Channel Sensing Intrinsic Amplitude: 12 mV
Lead Channel Setting Pacing Amplitude: 1.5 V
Lead Channel Setting Pacing Amplitude: 2.125
Lead Channel Setting Pacing Amplitude: 2.5 V
Lead Channel Setting Pacing Pulse Width: 0.5 ms
Lead Channel Setting Pacing Pulse Width: 0.7 ms
Lead Channel Setting Sensing Sensitivity: 0.5 mV
Pulse Gen Serial Number: 111052020

## 2021-04-20 NOTE — Progress Notes (Signed)
?  ?Cardiology Office Note ? ? ?Date:  04/21/2021  ? ?ID:  Gregory [name], DOB 01/31/1950, MRN [id number] ? ?PCP:  Jinny Sanders, MD  ?Cardiologist:   [name], MD ? ? ?Chief Complaint  ?Patient presents with  ? Cardiomyopathy  ? ?  ?History of Present Illness: ?Gregory [name] is a 71 y.o. male for follow up of CAD/CABG and AVR.   He had a cardiac catheterization during recent hospitalization on 03/04/2019 which revealed severe calcific disease in the mid LAD, the circumflex had mild to moderate proximal stenosis, the RCA, a large dominant vessel, had severe proximal stenosis and severe mid stenosis with poor flow into the distal vessel.  He was also found to have severe aortic regurgitation and moderate aortic valve stenosis, with a mean gradient across aortic valve 13mHg with AVA, but judged to be severe aortic insufficiency in the setting of dilated aortic root with high flow from AI leading to elevated gradient across aortic valve.  He underwent coronary artery bypass graft with aortic valve replacement on 03/06/2019.  The patient had LIMA to LAD, left radial artery to PDA, predicted RIMA to the right posterior lateral artery, with open left radial artery harvest, bilateral IMA harvesting.  Also, 25 mm [name] Intuity Bovine bioprosthetic aortic valve placement. ? ?Of note, the patient had a severe reaction to heparin with HIT and severe profound thrombocytopenia postoperatively. He had postoperative atrial fibrillation and therefore was started on amiodarone 200 mg daily.  He had a left arm hematoma and an elevated INR.  He was followed by heme.  On a follow an echo he a perivalvular leak.   He was admitted with HB and heart failure and hs severe paravalvular leak.  He eventually balloon valvuloplasty of his prosthetic valve and CRTD pacing.   On follow up echo the paravalvular leak was moderate.   ? ?He presents for follow up.  Since I saw him he still has not had his knees fixed.  This is pending his  hemoglobin being a little bit better.  He gets winded climbing 21 stairs but he can make it to the top.  He is not having any resting shortness of breath.  He is not having any PND or orthopnea.  He denies any palpitations, presyncope or syncope.  He has had no chest pressure. ? ? ?Past Medical History:  ?Diagnosis Date  ? Alcohol abuse   ? Anxiety   ? Aortic valve disease   ? Arthritis   ? Coronary artery disease   ? Glaucoma   ? Hypertension   ? OSA (obstructive sleep apnea)   ? Pre-diabetes   ? S/P balloon aortic valvuloplasty 10/18/2020  ? done for bioprosthetic aortic valve replacement perivalvular leaking reducing it from severe to mild.  ? Shingles   ? ? ?Past Surgical History:  ?Procedure Laterality Date  ? AORTIC ARCH ANGIOGRAPHY N/A 10/10/2020  ? Procedure: AORTIC ARCH ANGIOGRAPHY;  Surgeon: ENelva Bush MD;  [facility];  Service: Cardiovascular;  Laterality: N/A;  ? AORTIC VALVE REPLACEMENT N/A 03/06/2019  ? Procedure: AORTIC VALVE REPLACEMENT (AVR), USING INTUITY 25MM;  Surgeon: AWonda Olds MD;  [facility] [facility]  Service: Open Heart Surgery;  Laterality: N/A;  ? BALLOON AORTIC VALVE VALVULOPLASTY N/A 10/18/2020  ? Procedure: BALLOON AORTIC VALVE VALVULOPLASTY;  Surgeon: CSherren Mocha MD;  [place] [facility]  Service: Cardiovascular;  Laterality: N/A;  ? BIV PACEMAKER INSERTION CRT-P N/A 10/20/2020  ? Procedure: BIV PACEMAKER INSERTION  CRT-P;  Surgeon: Vickie Epley, MD;  Location: [place] [facility];  Service: Cardiovascular;  Laterality: N/A;  ? CARDIAC CATHETERIZATION    ? CATARACT EXTRACTION    ? CORONARY ARTERY BYPASS GRAFT N/A 03/06/2019  ? Procedure: CORONARY ARTERY BYPASS GRAFTING (CABG), ON PUMP, TIMES THREE, USING BILATERAL INTERNAL MAMMARIES AND LEFT RADIAL ARTERY HARVEST;  Surgeon: Wonda Olds, MD;  Location: [place];  Service: Open Heart Surgery;  Laterality: N/A;  BILATERAL IMA  ? LEFT HEART CATH AND CORONARY ANGIOGRAPHY N/A 03/04/2019  ? Procedure: LEFT  HEART CATH AND CORONARY ANGIOGRAPHY;  Surgeon: Burnell Blanks, MD;  Location: [place] [facility];  Service: Cardiovascular;  Laterality: N/A;  ? None    ? RADIAL ARTERY HARVEST Left 03/06/2019  ? Procedure: RADIAL ARTERY HARVEST;  Surgeon: Wonda Olds, MD;  Location: [place];  Service: Open Heart Surgery;  Laterality: Left;  ? RIGHT HEART CATH AND CORONARY/GRAFT ANGIOGRAPHY N/A 10/10/2020  ? Procedure: RIGHT HEART CATH AND CORONARY/GRAFT ANGIOGRAPHY;  Surgeon: Nelva Bush, MD;  Location: [place] [facility];  Service: Cardiovascular;  Laterality: N/A;  ? TEE WITHOUT CARDIOVERSION N/A 03/06/2019  ? Procedure: TRANSESOPHAGEAL ECHOCARDIOGRAM (TEE);  Surgeon: Wonda Olds, MD;  Location: [place];  Service: Open Heart Surgery;  Laterality: N/A;  ? TEE WITHOUT CARDIOVERSION N/A 10/10/2020  ? Procedure: TRANSESOPHAGEAL ECHOCARDIOGRAM (TEE);  Surgeon: Josue Hector, MD;  Location[facility];  Service: Cardiovascular;  Laterality: N/A;  ? TEE WITHOUT CARDIOVERSION N/A 10/18/2020  ? Procedure: TRANSESOPHAGEAL ECHOCARDIOGRAM (TEE);  Surgeon: Sherren Mocha, MD;  Location: [place];  Service: Open Heart Surgery;  Laterality: N/A;  ? TEMPORARY PACEMAKER N/A 10/17/2020  ? Procedure: TEMPORARY PACEMAKER;  Surgeon: Evans Lance, MD;  Location: [place] [facility];  Service: Cardiovascular;  Laterality: N/A;  ? ? ? ?Current Outpatient Medications  ?Medication Sig Dispense Refill  ? acetaminophen (TYLENOL) 500 MG tablet Take 1,000 mg by mouth every 6 (six) hours as needed for moderate pain.    ? allopurinol (ZYLOPRIM) 100 MG tablet Take 1 tablet by mouth once daily 90 tablet 1  ? ALPRAZolam (XANAX) 0.5 MG tablet TAKE ONE TABLET BY MOUTH ONCE DAILY AS NEEDED FOR ANIXETY. TAKE 30 MINUTES PRIOR TO PLANE FLIGHT OR AS NEEDED FOR ANXIETY 20 tablet 0  ? aspirin EC 81 MG tablet Take 1 tablet (81 mg total) by mouth daily. Swallow whole. 30 tablet 11  ? atorvastatin (LIPITOR) 80 MG tablet Take 1 tablet by mouth once daily  90 tablet 0  ? Biotin 10 MG CAPS Take by mouth.    ? colchicine 0.6 MG tablet Take 2 tablets by mouth once daily 60 tablet 0  ? furosemide (LASIX) 20 MG tablet TAKE 2 TABLETS BY MOUTH IN THE MORNING AND  ONE  TABLET  IN  THE  AFTERNOON 90 tablet 0  ? ketoconazole (NIZORAL) 2 % shampoo Apply 1 application topically 2 (two) times a week. 120 mL 1  ? latanoprost (XALATAN) 0.005 % ophthalmic solution Place 1 drop into both eyes every morning.     ? metoprolol succinate (TOPROL-XL) 25 MG 24 hr tablet Take 1 tablet (25 mg total) by mouth 2 (two) times daily. 180 tablet 3  ? Misc Natural Products (OSTEO BI-FLEX/5-LOXIN ADVANCED) TABS Take 2 tablets by mouth daily.    ? potassium chloride SA (KLOR-CON) 20 MEQ tablet Take 1 tablet (20 mEq total) by mouth daily. 90 tablet 2  ? sacubitril-valsartan (ENTRESTO) 97-103 MG Take 1 tablet  by mouth 2 (two) times daily. 60 tablet 11  ? sertraline (ZOLOFT) 50 MG tablet Take 1 tablet by mouth once daily 90 tablet 0  ? spironolactone (ALDACTONE) 25 MG tablet Take 1 tablet (25 mg total) by mouth daily. 90 tablet 3  ? traMADol (ULTRAM) 50 MG tablet Take 1 tablet (50 mg total) by mouth every 4 (four) hours as needed. for pain 30 tablet 0  ? traZODone (DESYREL) 50 MG tablet Take 0.5-1 tablets (25-50 mg total) by mouth at bedtime as needed for sleep. (Patient not taking: Reported on 04/21/2021) 30 tablet 3  ? ?No current facility-administered medications for this visit.  ? ? ?Allergies:   Heparin  ? ? ?ROS:  Please see the history of present illness.   Otherwise, review of systems are positive for none.   All other systems are reviewed and negative.  ? ? ?PHYSICAL EXAM: ?VS:  BP 136/74   Pulse 64   Ht 5' 6.5" (1.689 m)   Wt 226 lb 9.6 oz (102.8 kg)   SpO2 96%   BMI 36.03 kg/m?  , BMI Body mass index is 36.03 kg/m?.  ?GENERAL:  Well appearing ?NECK:  No jugular venous distention, waveform within normal limits, carotid upstroke brisk and symmetric, no bruits, no thyromegaly ?LUNGS:  Clear  to auscultation bilaterally ?CHEST:  Unremarkable ?HEART:  PMI not displaced or sustained,S1 and S2 within normal limits, no S3, no S4, no clicks, no rubs, 2 out of 6 apical systolic murmur radiating slightly

## 2021-04-21 ENCOUNTER — Ambulatory Visit: Payer: Medicare HMO | Admitting: Cardiology

## 2021-04-21 ENCOUNTER — Encounter: Payer: Self-pay | Admitting: Cardiology

## 2021-04-21 VITALS — BP 136/74 | HR 64 | Ht 66.5 in | Wt 226.6 lb

## 2021-04-21 DIAGNOSIS — I2510 Atherosclerotic heart disease of native coronary artery without angina pectoris: Secondary | ICD-10-CM

## 2021-04-21 DIAGNOSIS — I420 Dilated cardiomyopathy: Secondary | ICD-10-CM | POA: Diagnosis not present

## 2021-04-21 DIAGNOSIS — Z950 Presence of cardiac pacemaker: Secondary | ICD-10-CM

## 2021-04-21 DIAGNOSIS — E785 Hyperlipidemia, unspecified: Secondary | ICD-10-CM

## 2021-04-21 DIAGNOSIS — M170 Bilateral primary osteoarthritis of knee: Secondary | ICD-10-CM | POA: Diagnosis not present

## 2021-04-21 MED ORDER — SACUBITRIL-VALSARTAN 97-103 MG PO TABS: 1.0000 | ORAL_TABLET | Freq: Two times a day (BID) | ORAL | 11 refills | Status: DC

## 2021-04-21 NOTE — Patient Instructions (Signed)
Medication Instructions:  ?INCREASE the Entresto to 97-103 mg twice daily ? ?*If you need a refill on your cardiac medications before your next appointment, please call your pharmacy* ? ? ?Lab Work: ?None ordered ?If you have labs (blood work) drawn today and your tests are completely normal, you will receive your results only by: ?MyChart Message (if you have MyChart) OR ?A paper copy in the mail ?If you have any lab test that is abnormal or we need to change your treatment, we will call you to review the results. ? ? ?Testing/Procedures: ?None ordered ? ? ?Follow-Up: ?At [facility], you and your health needs are our priority.  As part of our continuing mission to provide you with exceptional heart care, we have created designated Provider Care Teams.  These Care Teams include your primary Cardiologist (physician) and Advanced Practice Providers (APPs -  Physician Assistants and Nurse Practitioners) who all work together to provide you with the care you need, when you need it. ? ?We recommend signing up for the patient portal called "MyChart".  Sign up information is provided on this After Visit Summary.  MyChart is used to connect with patients for Virtual Visits (Telemedicine).  Patients are able to view lab/test results, encounter notes, upcoming appointments, etc.  Non-urgent messages can be sent to your provider as well.   ?To learn more about what you can do with MyChart, go to [email or web address].   ? ?Your next appointment:   ?Follow up in November with Dr. Percival Spanish ? ?Important Information About Sugar ? ? ? ? ? ? ?

## 2021-04-24 ENCOUNTER — Other Ambulatory Visit (INDEPENDENT_AMBULATORY_CARE_PROVIDER_SITE_OTHER): Payer: Medicare HMO

## 2021-04-24 ENCOUNTER — Other Ambulatory Visit: Payer: Self-pay | Admitting: Family Medicine

## 2021-04-24 DIAGNOSIS — R7303 Prediabetes: Secondary | ICD-10-CM | POA: Diagnosis not present

## 2021-04-24 DIAGNOSIS — E559 Vitamin D deficiency, unspecified: Secondary | ICD-10-CM | POA: Diagnosis not present

## 2021-04-24 DIAGNOSIS — E7800 Pure hypercholesterolemia, unspecified: Secondary | ICD-10-CM

## 2021-04-24 LAB — COMPREHENSIVE METABOLIC PANEL
ALT: 21 U/L (ref 0–53)
AST: 54 U/L — ABNORMAL HIGH (ref 0–37)
Albumin: 4.4 g/dL (ref 3.5–5.2)
Alkaline Phosphatase: 43 U/L (ref 39–117)
BUN: 24 mg/dL — ABNORMAL HIGH (ref 6–23)
CO2: 29 mEq/L (ref 19–32)
Calcium: 9.2 mg/dL (ref 8.4–10.5)
Chloride: 103 mEq/L (ref 96–112)
Creatinine, Ser: 0.86 mg/dL (ref 0.40–1.50)
GFR: 86.41 mL/min (ref >60.00)
Glucose, Bld: 98 mg/dL (ref 70–99)
Potassium: 5.4 mEq/L — ABNORMAL HIGH (ref 3.5–5.1)
Sodium: 139 mEq/L (ref 135–145)
Total Bilirubin: 1.3 mg/dL — ABNORMAL HIGH (ref 0.2–1.2)
Total Protein: 6.5 g/dL (ref 6.0–8.3)

## 2021-04-24 LAB — HEMOGLOBIN A1C: Hgb A1c MFr Bld: 4.6 % (ref 4.6–6.5)

## 2021-04-24 LAB — LIPID PANEL
Cholesterol: 150 mg/dL (ref 0–200)
HDL: 77.9 mg/dL (ref >39.00)
LDL Cholesterol: 53 mg/dL (ref 0–99)
NonHDL: 71.68
Total CHOL/HDL Ratio: 2
Triglycerides: 94 mg/dL (ref 0.0–149.0)
VLDL: 18.8 mg/dL (ref 0.0–40.0)

## 2021-04-24 LAB — VITAMIN D 25 HYDROXY (VIT D DEFICIENCY, FRACTURES): VITD: 44.25 ng/mL (ref 30.00–100.00)

## 2021-04-25 NOTE — Progress Notes (Signed)
No critical labs need to be addressed urgently. We will discuss labs in detail at upcoming office visit.   

## 2021-04-26 DIAGNOSIS — C44719 Basal cell carcinoma of skin of left lower limb, including hip: Secondary | ICD-10-CM | POA: Diagnosis not present

## 2021-04-26 DIAGNOSIS — D2272 Melanocytic nevi of left lower limb, including hip: Secondary | ICD-10-CM | POA: Diagnosis not present

## 2021-04-26 DIAGNOSIS — L821 Other seborrheic keratosis: Secondary | ICD-10-CM | POA: Diagnosis not present

## 2021-04-26 DIAGNOSIS — L570 Actinic keratosis: Secondary | ICD-10-CM | POA: Diagnosis not present

## 2021-04-26 DIAGNOSIS — D485 Neoplasm of uncertain behavior of skin: Secondary | ICD-10-CM | POA: Diagnosis not present

## 2021-04-26 DIAGNOSIS — D2262 Melanocytic nevi of left upper limb, including shoulder: Secondary | ICD-10-CM | POA: Diagnosis not present

## 2021-04-26 DIAGNOSIS — X32XXXA Exposure to sunlight, initial encounter: Secondary | ICD-10-CM | POA: Diagnosis not present

## 2021-04-26 DIAGNOSIS — D2261 Melanocytic nevi of right upper limb, including shoulder: Secondary | ICD-10-CM | POA: Diagnosis not present

## 2021-04-26 DIAGNOSIS — D225 Melanocytic nevi of trunk: Secondary | ICD-10-CM | POA: Diagnosis not present

## 2021-04-26 DIAGNOSIS — D2271 Melanocytic nevi of right lower limb, including hip: Secondary | ICD-10-CM | POA: Diagnosis not present

## 2021-04-27 ENCOUNTER — Ambulatory Visit (INDEPENDENT_AMBULATORY_CARE_PROVIDER_SITE_OTHER): Payer: Medicare HMO | Admitting: Family Medicine

## 2021-04-27 VITALS — BP 126/70 | HR 69 | Ht 67.0 in | Wt 225.8 lb

## 2021-04-27 DIAGNOSIS — Z0000 Encounter for general adult medical examination without abnormal findings: Secondary | ICD-10-CM

## 2021-04-27 DIAGNOSIS — M1A09X0 Idiopathic chronic gout, multiple sites, without tophus (tophi): Secondary | ICD-10-CM | POA: Diagnosis not present

## 2021-04-27 DIAGNOSIS — I10 Essential (primary) hypertension: Secondary | ICD-10-CM

## 2021-04-27 DIAGNOSIS — N62 Hypertrophy of breast: Secondary | ICD-10-CM | POA: Diagnosis not present

## 2021-04-27 DIAGNOSIS — Z803 Family history of malignant neoplasm of breast: Secondary | ICD-10-CM

## 2021-04-27 DIAGNOSIS — F411 Generalized anxiety disorder: Secondary | ICD-10-CM

## 2021-04-27 DIAGNOSIS — E785 Hyperlipidemia, unspecified: Secondary | ICD-10-CM

## 2021-04-27 DIAGNOSIS — I480 Paroxysmal atrial fibrillation: Secondary | ICD-10-CM

## 2021-04-27 DIAGNOSIS — R69 Illness, unspecified: Secondary | ICD-10-CM | POA: Diagnosis not present

## 2021-04-27 DIAGNOSIS — E875 Hyperkalemia: Secondary | ICD-10-CM

## 2021-04-27 NOTE — Progress Notes (Signed)
Patient ID: Gregory [name], male    DOB: 1950-06-02, 71 y.o.   MRN: [id number]  This visit was conducted in person.  BP 126/70   Pulse 69   Ht '5\' 7"'$  (1.702 m)   Wt 225 lb 12.8 oz (102.4 kg)   SpO2 94%   BMI 35.37 kg/m    CC:   Chief Complaint  Patient presents with   Annual Exam    Medicare wellness    Subjective:   HPI: Gregory [name] is a 71 y.o. male presenting on 04/27/2021 for Annual Exam (Medicare wellness)  The patient presents for annual medicare wellness, complete physical and review of chronic health problems. He/She also has the following acute concerns today: Bilateral breast soreness in last month. No change in size. No nipple change, no lumps.  Has started biotin. He is on spirolactone.   His mother and sister with breast cancer.   I have personally reviewed the Medicare Annual Wellness questionnaire and have noted 1. The patient's medical and social history 2. Their use of alcohol, tobacco or illicit drugs 3. Their current medications and supplements 4. The patient's functional ability including ADL's, fall risks, home safety risks and hearing or visual             impairment. 5. Diet and physical activities 6. Evidence for depression or mood disorders 7.         Updated provider list Cognitive evaluation was performed and recorded on pt medicare questionnaire form. The patients weight, height, BMI and visual acuity have been recorded in the chart   I have made referrals, counseling and provided education to the patient based review of the above and I have provided the pt with a written personalized care plan for preventive services.   Documentation of this information was scanned into the electronic record under the media tab.   Advance directives and end of life planning reviewed in detail with patient and documented in EMR. Patient given handout on advance care directives if needed. HCPOA and living will updated if needed.  Hearing Screening    '500Hz'$  '1000Hz'$  '2000Hz'$  '4000Hz'$   Right ear 0 40 0 40  Left ear 40 40 0 40   Vision Screening   Right eye Left eye Both eyes  Without correction     With correction '20/30 20/25 20/20 '$   No falls in last 12 months.    MDD/GAD: well controlled on sertraline 50 mcg daily [place] Visit from 04/27/2021 in [place] at [facility] Total Score 0      Wilburn Mylar had a biopsy for possible skin cancer.  Reviewed office visit from 04/21/2021 Dr. Percival Spanish He is currently following CAD status post CABG , atrial fibrillation and cardiomyopathy.  Previous ejection fraction was 45 to 50% and this will be reevaluated with an upcoming echo.  At most recent office visit Entresto was increased to the max dose. It is post bioprosthetic aortic valve replacement followed by Dr. Burt Knack CVTS  History of HIT   Hypertension:  Well controlled on current regimen BP Readings from Last 3 Encounters:  04/27/21 126/70  04/21/21 136/74  03/20/21 (!) 144/62  Using medication without problems or lightheadedness:  none Chest pain with exertion:none Edema:none Short of breath:none Average home BPs: good Other issues:  Prediabetes: Lab Results  Component Value Date   HGBA1C 4.6 04/24/2021   History of anemia, iron deficiency: Followed by Dr. Payton Mccallum  Elevated Cholesterol: LDL at goal less than 70  on atorvastatin 80 mg daily Lab Results  Component Value Date   CHOL 150 04/24/2021   HDL 77.90 04/24/2021   LDLCALC 53 04/24/2021   LDLDIRECT 151.0 01/05/2016   TRIG 94.0 04/24/2021   CHOLHDL 2 04/24/2021  Using medications without problems: Muscle aches:  Diet compliance: Exercise: Other complaints:   Gout no recent flares on allopurinol 100 mg daily.  Uses colchicine as needed Lab Results  Component Value Date   LABURIC 4.6 05/27/2020   Planned left knee arthroplasty had been held by low iron and anemia snd now by skin lesion on left leg.   Potassium was slight high     Patient Care Team: Jinny Sanders, MD as PCP - General (Family Medicine) [name], MD as PCP - Cardiology (Cardiology) Vickie Epley, MD as PCP - Electrophysiology (Cardiology) Lendon Colonel, NP as Nurse Practitioner (Cardiology) Debbora Dus, [facility] as Pharmacist (Pharmacist)   Relevant past medical, surgical, family and social history reviewed and updated as indicated. Interim medical history since our last visit reviewed. Allergies and medications reviewed and updated. Outpatient Medications Prior to Visit  Medication Sig Dispense Refill   acetaminophen (TYLENOL) 500 MG tablet Take 1,000 mg by mouth every 6 (six) hours as needed for moderate pain.     allopurinol (ZYLOPRIM) 100 MG tablet Take 1 tablet by mouth once daily 90 tablet 1   ALPRAZolam (XANAX) 0.5 MG tablet TAKE ONE TABLET BY MOUTH ONCE DAILY AS NEEDED FOR ANIXETY. TAKE 30 MINUTES PRIOR TO PLANE FLIGHT OR AS NEEDED FOR ANXIETY 20 tablet 0   aspirin EC 81 MG tablet Take 1 tablet (81 mg total) by mouth daily. Swallow whole. 30 tablet 11   atorvastatin (LIPITOR) 80 MG tablet Take 1 tablet by mouth once daily 90 tablet 0   Biotin 10 MG CAPS Take by mouth.     colchicine 0.6 MG tablet Take 2 tablets by mouth once daily 60 tablet 2   furosemide (LASIX) 20 MG tablet TAKE 2 TABLETS BY MOUTH IN THE MORNING AND  ONE  TABLET  IN  THE  AFTERNOON 90 tablet 0   ketoconazole (NIZORAL) 2 % shampoo Apply 1 application topically 2 (two) times a week. 120 mL 1   latanoprost (XALATAN) 0.005 % ophthalmic solution Place 1 drop into both eyes every morning.      metoprolol succinate (TOPROL-XL) 25 MG 24 hr tablet Take 1 tablet (25 mg total) by mouth 2 (two) times daily. 180 tablet 3   Misc Natural Products (OSTEO BI-FLEX/5-LOXIN ADVANCED) TABS Take 2 tablets by mouth daily.     potassium chloride SA (KLOR-CON) 20 MEQ tablet Take 1 tablet (20 mEq total) by mouth daily. 90 tablet 2   sacubitril-valsartan (ENTRESTO) 97-103 MG Take 1  tablet by mouth 2 (two) times daily. 60 tablet 11   sertraline (ZOLOFT) 50 MG tablet Take 1 tablet by mouth once daily 90 tablet 0   spironolactone (ALDACTONE) 25 MG tablet Take 1 tablet (25 mg total) by mouth daily. 90 tablet 3   traMADol (ULTRAM) 50 MG tablet Take 1 tablet (50 mg total) by mouth every 4 (four) hours as needed. for pain 30 tablet 0   traZODone (DESYREL) 50 MG tablet Take 0.5-1 tablets (25-50 mg total) by mouth at bedtime as needed for sleep. 30 tablet 3   No facility-administered medications prior to visit.     Per HPI unless specifically indicated in ROS section below Review of Systems  Constitutional:  Negative for fatigue and  fever.  HENT:  Negative for ear pain.   Eyes:  Negative for pain.  Respiratory:  Negative for cough and shortness of breath.   Cardiovascular:  Negative for chest pain, palpitations and leg swelling.  Gastrointestinal:  Negative for abdominal pain.  Genitourinary:  Negative for dysuria.  Musculoskeletal:  Negative for arthralgias.  Neurological:  Negative for syncope, light-headedness and headaches.  Psychiatric/Behavioral:  Negative for dysphoric mood.   Objective:  BP 126/70   Pulse 69   Ht '5\' 7"'$  (1.702 m)   Wt 225 lb 12.8 oz (102.4 kg)   SpO2 94%   BMI 35.37 kg/m   Wt Readings from Last 3 Encounters:  04/27/21 225 lb 12.8 oz (102.4 kg)  04/21/21 226 lb 9.6 oz (102.8 kg)  03/20/21 223 lb 3.2 oz (101.2 kg)      Physical Exam Constitutional:      General: He is not in acute distress.    Appearance: Normal appearance. He is well-developed. He is not ill-appearing or toxic-appearing.  HENT:     Head: Normocephalic and atraumatic.     Right Ear: Hearing, tympanic membrane, ear canal and external ear normal.     Left Ear: Hearing, tympanic membrane, ear canal and external ear normal.     Nose: Nose normal.     Mouth/Throat:     Pharynx: Uvula midline.  Eyes:     General: Lids are normal. Lids are everted, no foreign bodies  appreciated.     Conjunctiva/sclera: Conjunctivae normal.     Pupils: Pupils are equal, round, and reactive to light.  Neck:     Thyroid: No thyroid mass or thyromegaly.     Vascular: No carotid bruit.     Trachea: Trachea and phonation normal.  Cardiovascular:     Rate and Rhythm: Normal rate and regular rhythm.     Pulses: Normal pulses.     Heart sounds: S1 normal and S2 normal. No murmur heard.   No gallop.  Pulmonary:     Breath sounds: Normal breath sounds. No wheezing, rhonchi or rales.  Abdominal:     General: Bowel sounds are normal.     Palpations: Abdomen is soft.     Tenderness: There is no abdominal tenderness. There is no guarding or rebound.     Hernia: No hernia is present.  Musculoskeletal:     Cervical back: Normal range of motion and neck supple.  Lymphadenopathy:     Cervical: No cervical adenopathy.  Skin:    General: Skin is warm and dry.     Findings: No rash.  Neurological:     Mental Status: He is alert.     Cranial Nerves: No cranial nerve deficit.     Sensory: No sensory deficit.     Gait: Gait normal.     Deep Tendon Reflexes: Reflexes are normal and symmetric.  Psychiatric:        Speech: Speech normal.        Behavior: Behavior normal.        Judgment: Judgment normal.      Results for orders placed or performed in visit on 04/24/21  VITAMIN D 25 Hydroxy (Vit-D Deficiency, Fractures)  Result Value Ref Range   VITD 44.25 30.00 - 100.00 ng/mL  Comprehensive metabolic panel  Result Value Ref Range   Sodium 139 135 - 145 mEq/L   Potassium 5.4 No hemolysis seen (H) 3.5 - 5.1 mEq/L   Chloride 103 96 - 112 mEq/L   CO2 29 19 -  32 mEq/L   Glucose, Bld 98 70 - 99 mg/dL   BUN 24 (H) 6 - 23 mg/dL   Creatinine, Ser 0.86 0.40 - 1.50 mg/dL   Total Bilirubin 1.3 (H) 0.2 - 1.2 mg/dL   Alkaline Phosphatase 43 39 - 117 U/L   AST 54 (H) 0 - 37 U/L   ALT 21 0 - 53 U/L   Total Protein 6.5 6.0 - 8.3 g/dL   Albumin 4.4 3.5 - 5.2 g/dL   GFR 86.41 >60.00  mL/min   Calcium 9.2 8.4 - 10.5 mg/dL  Lipid panel  Result Value Ref Range   Cholesterol 150 0 - 200 mg/dL   Triglycerides 94.0 0.0 - 149.0 mg/dL   HDL 77.90 >39.00 mg/dL   VLDL 18.8 0.0 - 40.0 mg/dL   LDL Cholesterol 53 0 - 99 mg/dL   Total CHOL/HDL Ratio 2    NonHDL 71.68   Hemoglobin A1c  Result Value Ref Range   Hgb A1c MFr Bld 4.6 4.6 - 6.5 %    This visit occurred during the SARS-CoV-2 public health emergency.  Safety protocols were in place, including screening questions prior to the visit, additional usage of staff PPE, and extensive cleaning of exam room while observing appropriate contact time as indicated for disinfecting solutions.   COVID 19 screen:  No recent travel or known exposure to COVID19 The patient denies respiratory symptoms of COVID 19 at this time. The importance of social distancing was discussed today.   Assessment and Plan   The patient's preventative maintenance and recommended screening tests for an annual wellness exam were reviewed in full today. Brought up to date unless services declined.  Counselled on the importance of diet, exercise, and its role in overall health and mortality. The patient's FH and SH was reviewed, including their home life, tobacco status, and drug and alcohol status.  Vaccines: given flu.. uptodate with PNA, Tdap.. Consider shingles S/P COVID x 3 Prostate Cancer Screen:   Not indicated given age. Colon Cancer Screen:  He is not interested in any screening typing.      Smoking Status:no smoker ETOH/ drug use:  2-3 per day  Hep C: neg  HIV screen:   refused  Problem List Items Addressed This Visit     Essential hypertension (Chronic)    Stable, chronic.  Continue current medication.         Atrial fibrillation (HCC)    Chronic, stable  On anticoagulant followed by cardiology        Chronic gout of multiple sites    Chronic, no recent flares.  Continue allopurinol 100 mg p.o. daily use colchicine as needed  for flare       Dyslipidemia    Chronic, well controlled  LDL at goal less than 70 on atorvastatin 80 mg daily      GAD (generalized anxiety disorder)    Stable, chronic.  Continue current medication.    sertraline 50 mg daily      Other Visit Diagnoses     Medicare annual wellness visit, subsequent    -  Primary   Hyperkalemia       Relevant Orders   Basic Metabolic Panel (Completed)   Gynecomastia       Relevant Orders   Luteinizing hormone (Completed)   TSH (Completed)   HCG, Tumor Marker (Completed)   Testosterone Total,Free,Bio, Males (Completed)   Family history of breast cancer          Orders Placed This Encounter  Procedures   Basic Metabolic Panel    Standing Status:   Future    Number of Occurrences:   1    Standing Expiration Date:   04/28/2022   Luteinizing hormone    Standing Status:   Future    Number of Occurrences:   1    Standing Expiration Date:   04/28/2022   TSH    Standing Status:   Future    Number of Occurrences:   1    Standing Expiration Date:   04/28/2022   HCG, Tumor Marker    Standing Status:   Future    Number of Occurrences:   1    Standing Expiration Date:   04/28/2022   Testosterone Total,Free,Bio, Males    Standing Status:   Future    Number of Occurrences:   1    Standing Expiration Date:   04/28/2022     [name], MD

## 2021-04-27 NOTE — Patient Instructions (Signed)
HOLD potassium supplement and multivitamin. ? ? ?

## 2021-05-01 ENCOUNTER — Telehealth: Payer: Self-pay | Admitting: Family Medicine

## 2021-05-01 ENCOUNTER — Other Ambulatory Visit (INDEPENDENT_AMBULATORY_CARE_PROVIDER_SITE_OTHER): Payer: Medicare HMO

## 2021-05-01 DIAGNOSIS — E875 Hyperkalemia: Secondary | ICD-10-CM | POA: Diagnosis not present

## 2021-05-01 DIAGNOSIS — N62 Hypertrophy of breast: Secondary | ICD-10-CM

## 2021-05-01 DIAGNOSIS — F411 Generalized anxiety disorder: Secondary | ICD-10-CM

## 2021-05-01 LAB — BASIC METABOLIC PANEL
BUN: 25 mg/dL — ABNORMAL HIGH (ref 6–23)
CO2: 28 mEq/L (ref 19–32)
Calcium: 8.8 mg/dL (ref 8.4–10.5)
Chloride: 105 mEq/L (ref 96–112)
Creatinine, Ser: 0.9 mg/dL (ref 0.40–1.50)
GFR: 85.22 mL/min (ref >60.00)
Glucose, Bld: 88 mg/dL (ref 70–99)
Potassium: 4.7 mEq/L (ref 3.5–5.1)
Sodium: 140 mEq/L (ref 135–145)

## 2021-05-01 LAB — TSH: TSH: 2.9 u[IU]/mL (ref 0.35–5.50)

## 2021-05-01 LAB — LUTEINIZING HORMONE: LH: 12.35 m[IU]/mL (ref 3.10–34.60)

## 2021-05-01 MED ORDER — TRAZODONE HCL 50 MG PO TABS: 25.0000 mg | ORAL_TABLET | Freq: Every evening | ORAL | 1 refills | Status: DC | PRN

## 2021-05-01 MED ORDER — SERTRALINE HCL 50 MG PO TABS: 50.0000 mg | ORAL_TABLET | Freq: Every day | ORAL | 1 refills | Status: DC

## 2021-05-01 NOTE — Telephone Encounter (Signed)
?  Encourage patient to contact the pharmacy for refills or they can request refills through [facility] ? ?LAST APPOINTMENT DATE:  Please schedule appointment if longer than 1 year ? ?NEXT APPOINTMENT DATE: ? ?MEDICATION:sertraline (ZOLOFT) 50 MG tablet ?traZODone (DESYREL) 50 MG tablet ? ?Is the patient out of medication?  ? ?[place], [place] - [address] ? ?Let patient know to contact pharmacy at the end of the day to make sure medication is ready. ? ?Please notify patient to allow 48-72 hours to process ? ?CLINICAL FILLS OUT ALL BELOW:  ? ?LAST REFILL: ? ?QTY: ? ?REFILL DATE: ? ? ? ?OTHER COMMENTS:  ? ? ?Okay for refill? ? ?Please advise ? ? ? ? ?

## 2021-05-01 NOTE — Telephone Encounter (Signed)
Refills sent as requested

## 2021-05-02 LAB — TESTOSTERONE TOTAL,FREE,BIO, MALES
Albumin: 4.1 g/dL (ref 3.6–5.1)
Sex Hormone Binding: 16 nmol/L — ABNORMAL LOW (ref 22–77)
Testosterone: 152 ng/dL — ABNORMAL LOW (ref 250–827)

## 2021-05-02 LAB — BETA HCG QUANT (REF LAB): hCG Quant: <1 m[IU]/mL (ref 0–3)

## 2021-05-08 NOTE — Progress Notes (Signed)
Remote ICD transmission.   

## 2021-05-20 ENCOUNTER — Other Ambulatory Visit: Payer: Self-pay | Admitting: Cardiology

## 2021-05-22 ENCOUNTER — Ambulatory Visit: Payer: Medicare HMO

## 2021-05-22 ENCOUNTER — Other Ambulatory Visit: Payer: Self-pay

## 2021-05-22 ENCOUNTER — Inpatient Hospital Stay: Payer: Medicare HMO | Attending: Hematology and Oncology

## 2021-05-22 DIAGNOSIS — E538 Deficiency of other specified B group vitamins: Secondary | ICD-10-CM | POA: Diagnosis not present

## 2021-05-22 MED ORDER — CYANOCOBALAMIN 1000 MCG/ML IJ SOLN
1000.0000 ug | Freq: Once | INTRAMUSCULAR | Status: AC
  Administered 2021-05-22: 1000 ug via INTRAMUSCULAR
  Filled 2021-05-22: qty 1

## 2021-05-22 NOTE — Patient Instructions (Signed)
Vitamin B12 Deficiency Vitamin B12 deficiency occurs when the body does not have enough of this important vitamin. The body needs this vitamin: To make red blood cells. To make DNA. This is the genetic material inside cells. To help the nerves work properly so they can carry messages from the brain to the body. Vitamin B12 deficiency can cause health problems, such as not having enough red blood cells in the blood (anemia). This can lead to nerve damage if untreated. What are the causes? This condition may be caused by: Not eating enough foods that contain vitamin B12. Not having enough stomach acid and digestive fluids to properly absorb vitamin B12 from the food that you eat. Having certain diseases that make it hard to absorb vitamin B12. These diseases include Crohn's disease, chronic pancreatitis, and cystic fibrosis. An autoimmune disorder in which the body does not make enough of a protein (intrinsic factor) within the stomach, resulting in not enough absorption of vitamin B12. Having a surgery in which part of the stomach or small intestine is removed. Taking certain medicines that make it hard for the body to absorb vitamin B12. These include: Heartburn medicines, such as antacids and proton pump inhibitors. Some medicines that are used to treat diabetes. What increases the risk? The following factors may make you more likely to develop a vitamin B12 deficiency: Being an older adult. Eating a vegetarian or vegan diet that does not include any foods that come from animals. Eating a poor diet while you are pregnant. Taking certain medicines. Having alcoholism. What are the signs or symptoms? In some cases, there are no symptoms of this condition. If the condition leads to anemia or nerve damage, various symptoms may occur, such as: Weakness. Tiredness (fatigue). Loss of appetite. Numbness or tingling in your hands and feet. Redness and burning of the tongue. Depression,  confusion, or memory problems. Trouble walking. If anemia is severe, symptoms can include: Shortness of breath. Dizziness. Rapid heart rate. How is this diagnosed? This condition may be diagnosed with a blood test to measure the level of vitamin B12 in your blood. You may also have other tests, including: A group of tests that measure certain characteristics of blood cells (complete blood count, CBC). A blood test to measure intrinsic factor. A procedure where a thin tube with a camera on the end is used to look into your stomach or intestines (endoscopy). Other tests may be needed to discover the cause of the deficiency. How is this treated? Treatment for this condition depends on the cause. This condition may be treated by: Changing your eating and drinking habits, such as: Eating more foods that contain vitamin B12. Drinking less alcohol or no alcohol. Getting vitamin B12 injections. Taking vitamin B12 supplements by mouth (orally). Your health care provider will tell you which dose is best for you. Follow these instructions at home: Eating and drinking  Include foods in your diet that come from animals and contain a lot of vitamin B12. These include: Meats and poultry. This includes beef, pork, chicken, turkey, and organ meats, such as liver. Seafood. This includes clams, rainbow trout, salmon, tuna, and haddock. Eggs. Dairy foods such as milk, yogurt, and cheese. Eat foods that have vitamin B12 added to them (are fortified), such as ready-to-eat breakfast cereals. Check the label on the package to see if a food is fortified. The items listed above may not be a complete list of foods and beverages you can eat and drink. Contact a dietitian for   more information. Alcohol use Do not drink alcohol if: Your health care provider tells you not to drink. You are pregnant, may be pregnant, or are planning to become pregnant. If you drink alcohol: Limit how much you have to: 0-1 drink a  day for women. 0-2 drinks a day for men. Know how much alcohol is in your drink. In the [place] one drink equals one 12 oz bottle of beer (355 mL), one 5 oz glass of wine (148 mL), or one 1 oz glass of hard liquor (44 mL). General instructions Get vitamin B12 injections if told to by your health care provider. Take supplements only as told by your health care provider. Follow the directions carefully. Keep all follow-up visits. This is important. Contact a health care provider if: Your symptoms come back. Your symptoms get worse or do not improve with treatment. Get help right away: You develop shortness of breath. You have a rapid heart rate. You have chest pain. You become dizzy or you faint. These symptoms may be an emergency. Get help right away. Call 911. Do not wait to see if the symptoms will go away. Do not drive yourself to the hospital. Summary Vitamin B12 deficiency occurs when the body does not have enough of this important vitamin. Common causes include not eating enough foods that contain vitamin B12, not being able to absorb vitamin B12 from the food that you eat, having a surgery in which part of the stomach or small intestine is removed, or taking certain medicines. Eat foods that have vitamin B12 in them. Treatment may include making a change in the way you eat and drink, getting vitamin B12 injections, or taking vitamin B12 supplements. This information is not intended to replace advice given to you by your health care provider. Make sure you discuss any questions you have with your health care provider. Document Revised: 08/12/2020 Document Reviewed: 08/12/2020 Elsevier Patient Education  2023 [employer]  

## 2021-05-24 ENCOUNTER — Encounter: Payer: Self-pay | Admitting: Cardiovascular Disease

## 2021-05-24 ENCOUNTER — Ambulatory Visit: Payer: Medicare HMO | Admitting: Cardiovascular Disease

## 2021-05-24 ENCOUNTER — Ambulatory Visit (HOSPITAL_COMMUNITY): Payer: Medicare HMO | Attending: Cardiovascular Disease

## 2021-05-24 VITALS — BP 130/80 | HR 71 | Ht 67.0 in | Wt 227.0 lb

## 2021-05-24 DIAGNOSIS — I351 Nonrheumatic aortic (valve) insufficiency: Secondary | ICD-10-CM | POA: Diagnosis not present

## 2021-05-24 LAB — ECHOCARDIOGRAM COMPLETE
AR max vel: 2.13 cm2
AV Area VTI: 2.53 cm2
AV Area mean vel: 2.12 cm2
AV Mean grad: 16 mmHg
AV Peak grad: 27 mmHg
AV Vena cont: 0.3 cm
Ao pk vel: 2.6 m/s
Area-P 1/2: 2.81 cm2
Calc EF: 41.4 %
MV VTI: 2.97 cm2
P 1/2 time: 362 msec
S' Lateral: 4 cm
Single Plane A2C EF: 40.6 %
Single Plane A4C EF: 47.2 %

## 2021-05-24 NOTE — Progress Notes (Signed)
Cardiology Office Note:    Date:  05/24/2021   ID:  VERLE WHEELING, DOB 04-27-1950, MRN [id number]  PCP:  Jinny Sanders, MD   [facility] Providers Cardiologist:  [name], MD Cardiology APP:  Lendon Colonel, NP  Electrophysiologist:  Vickie Epley, MD     Referring MD: Jinny Sanders, MD   Chief Complaint  Patient presents with   Aortic Insuffiency    History of Present Illness:    Gregory [name] is a 71 y.o. male with a hx of  aortic valve disease.  He has a complicated history with a aortic stenosis and multivessel coronary artery disease.  He underwent multivessel CABG and aortic valve replacement in March 2021 with a 25 mm [name] Intuity rapid deployment valve.  Other problems include thoracic aortic aneurysm, postoperative heparin-induced thrombocytopenia, hypertension, obesity, and chronic combined systolic and diastolic heart failure.  The patient was hospitalized  in September 2022 with high-grade AV block and acute on chronic heart failure.  He was found to have severe paravalvular regurgitation and after extensive multidisciplinary heart team review, we elected to proceed with balloon valvuloplasty of his biological aortic valve prosthesis and pacemaker placement for high-grade conduction disease.  The patient was treated initially with a temp/permanent pacemaker lead in order to stabilize his heart rhythm perioperatively.  We then proceeded with balloon aortic valvuloplasty using a 26 mm Bard true balloon in order to expand the patient's surgical bioprosthesis and reduce his paravalvular regurgitation.  Following the procedure, the patient had mild residual paravalvular regurgitation with marked improvement from baseline.  He then underwent permanent pacemaker placement with a CRT-D device prior to discharge.  The patient is here alone today.  He is doing fairly well.  He is climbing up and down 21 steps multiple times per day at work.  The patient works  as a Games developer.  He has developed end-stage arthritis in his left knee and will require knee surgery in the future.  He has some degree of fatigue and exertional dyspnea but is able to do all of his activities of daily living without symptoms.  He denies chest pain, chest pressure, orthopnea, PND, or leg swelling.  He reports no recent change in his medications.  Past Medical History:  Diagnosis Date   Alcohol abuse    Anxiety    Aortic valve disease    Arthritis    Coronary artery disease    Glaucoma    Hypertension    OSA (obstructive sleep apnea)    Pre-diabetes    S/P balloon aortic valvuloplasty 10/18/2020   done for bioprosthetic aortic valve replacement perivalvular leaking reducing it from severe to mild.   Shingles     Past Surgical History:  Procedure Laterality Date   AORTIC ARCH ANGIOGRAPHY N/A 10/10/2020   Procedure: AORTIC ARCH ANGIOGRAPHY;  Surgeon: Nelva Bush, MD;  Location: [place] [facility];  Service: Cardiovascular;  Laterality: N/A;   AORTIC VALVE REPLACEMENT N/A 03/06/2019   Procedure: AORTIC VALVE REPLACEMENT (AVR), USING INTUITY 25MM;  Surgeon: Wonda Olds, MD;  Location: [place];  Service: Open Heart Surgery;  Laterality: N/A;   BALLOON AORTIC VALVE VALVULOPLASTY N/A 10/18/2020   Procedure: BALLOON AORTIC VALVE VALVULOPLASTY;  Surgeon: Sherren Mocha, MD;  Location: [place];  Service: Cardiovascular;  Laterality: N/A;   BIV PACEMAKER INSERTION CRT-P N/A 10/20/2020   Procedure: BIV PACEMAKER INSERTION CRT-P;  Surgeon: Vickie Epley, MD;  Location: [place] [facility];  Service: Cardiovascular;  Laterality: N/A;   CARDIAC CATHETERIZATION     CATARACT EXTRACTION     CORONARY ARTERY BYPASS GRAFT N/A 03/06/2019   Procedure: CORONARY ARTERY BYPASS GRAFTING (CABG), ON PUMP, TIMES THREE, USING BILATERAL INTERNAL MAMMARIES AND LEFT RADIAL ARTERY HARVEST;  Surgeon: Wonda Olds, MD;  Location: [place];  Service: Open Heart Surgery;  Laterality: N/A;   BILATERAL IMA   LEFT HEART CATH AND CORONARY ANGIOGRAPHY N/A 03/04/2019   Procedure: LEFT HEART CATH AND CORONARY ANGIOGRAPHY;  Surgeon: Burnell Blanks, MD;  Location: [place] [facility];  Service: Cardiovascular;  Laterality: N/A;   None     RADIAL ARTERY HARVEST Left 03/06/2019   Procedure: RADIAL ARTERY HARVEST;  Surgeon: Wonda Olds, MD;  Location: [place];  Service: Open Heart Surgery;  Laterality: Left;   RIGHT HEART CATH AND CORONARY/GRAFT ANGIOGRAPHY N/A 10/10/2020   Procedure: RIGHT HEART CATH AND CORONARY/GRAFT ANGIOGRAPHY;  Surgeon: Nelva Bush, MD;  Location: [place] [facility];  Service: Cardiovascular;  Laterality: N/A;   TEE WITHOUT CARDIOVERSION N/A 03/06/2019   Procedure: TRANSESOPHAGEAL ECHOCARDIOGRAM (TEE);  Surgeon: Wonda Olds, MD;  Location: [place];  Service: Open Heart Surgery;  Laterality: N/A;   TEE WITHOUT CARDIOVERSION N/A 10/10/2020   Procedure: TRANSESOPHAGEAL ECHOCARDIOGRAM (TEE);  Surgeon: Josue Hector, MD;  Location: [facility];  Service: Cardiovascular;  Laterality: N/A;   TEE WITHOUT CARDIOVERSION N/A 10/18/2020   Procedure: TRANSESOPHAGEAL ECHOCARDIOGRAM (TEE);  Surgeon: Sherren Mocha, MD;  Location: [place];  Service: Open Heart Surgery;  Laterality: N/A;   TEMPORARY PACEMAKER N/A 10/17/2020   Procedure: TEMPORARY PACEMAKER;  Surgeon: Evans Lance, MD;  Location: [place] [facility];  Service: Cardiovascular;  Laterality: N/A;    Current Medications: Current Meds  Medication Sig   acetaminophen (TYLENOL) 500 MG tablet Take 1,000 mg by mouth every 6 (six) hours as needed for moderate pain.   allopurinol (ZYLOPRIM) 100 MG tablet Take 1 tablet by mouth once daily   ALPRAZolam (XANAX) 0.5 MG tablet TAKE ONE TABLET BY MOUTH ONCE DAILY AS NEEDED FOR ANIXETY. TAKE 30 MINUTES PRIOR TO PLANE FLIGHT OR AS NEEDED FOR ANXIETY   aspirin EC 81 MG tablet Take 1 tablet (81 mg total) by mouth daily. Swallow whole.   atorvastatin (LIPITOR) 80 MG  tablet Take 1 tablet by mouth once daily   Biotin 10 MG CAPS Take by mouth.   colchicine 0.6 MG tablet Take 2 tablets by mouth once daily   furosemide (LASIX) 20 MG tablet TAKE TWO TABLETS BY MOUTH IN THE MORNING AND TAKE ONE TABLET BY MOUTH IN THE AFTERNOON   ketoconazole (NIZORAL) 2 % shampoo Apply 1 application topically 2 (two) times a week.   latanoprost (XALATAN) 0.005 % ophthalmic solution Place 1 drop into both eyes every morning.    metoprolol succinate (TOPROL-XL) 25 MG 24 hr tablet Take 1 tablet (25 mg total) by mouth 2 (two) times daily.   Misc Natural Products (OSTEO BI-FLEX/5-LOXIN ADVANCED) TABS Take 2 tablets by mouth daily.   potassium chloride SA (KLOR-CON) 20 MEQ tablet Take 1 tablet (20 mEq total) by mouth daily.   sacubitril-valsartan (ENTRESTO) 97-103 MG Take 1 tablet by mouth 2 (two) times daily.   sertraline (ZOLOFT) 50 MG tablet Take 1 tablet (50 mg total) by mouth daily.   spironolactone (ALDACTONE) 25 MG tablet Take 1 tablet (25 mg total) by mouth daily.   traMADol (ULTRAM) 50 MG tablet Take 1 tablet (50 mg total) by mouth every 4 (four)  hours as needed. for pain   traZODone (DESYREL) 50 MG tablet Take 0.5-1 tablets (25-50 mg total) by mouth at bedtime as needed for sleep.     Allergies:   Heparin   Social History   Socioeconomic History   Marital status: Married    Spouse name: Cindi   Number of children: 3   Years of education: Not on file   Highest education level: Not on file  Occupational History   Occupation: Architect  Tobacco Use   Smoking status: Never   Smokeless tobacco: Never  Vaping Use   Vaping Use: Never used  Substance and Sexual Activity   Alcohol use: Yes    Alcohol/week: 12.0 standard drinks    Types: 12 Cans of beer per week   Drug use: Yes    Frequency: 2.0 times per week    Types: Marijuana    Comment: gummies   Sexual activity: Not on file  Other Topics Concern   Not on file  Social History Narrative   Lives at home  with wife.   Social Determinants of Health   Financial Resource Strain: Low Risk    Difficulty of Paying Living Expenses: Not very hard  Food Insecurity: No Food Insecurity   Worried About Charity fundraiser in the Last Year: Never true   Ran Out of Food in the Last Year: Never true  Transportation Needs: No Transportation Needs   Lack of Transportation (Medical): No   Lack of Transportation (Non-Medical): No  Physical Activity: Sufficiently Active   Days of Exercise per Week: 5 days   Minutes of Exercise per Session: 150+ min  Stress: No Stress Concern Present   Feeling of Stress : Not at all  Social Connections: Moderately Isolated   Frequency of Communication with Friends and Family: Once a week   Frequency of Social Gatherings with Friends and Family: Three times a week   Attends Religious Services: Never   Active Member of Clubs or Organizations: No   Attends Archivist Meetings: Never   Marital Status: Married     Family History: The patient's family history includes Alcohol abuse in his father; Breast cancer in his sister; Cancer in his mother. He was adopted.  ROS:   Please see the history of present illness.    All other systems reviewed and are negative.  EKGs/Labs/Other Studies Reviewed:    The following studies were reviewed today: The patient's echo from this morning is personally reviewed.  On my review, the patient has 2 jets of paravalvular regurgitation that combined are likely moderate in severity.  The pressure half-time is 374 ms which is also consistent with moderate aortic insufficiency.  The peak and mean transvalvular gradients are 27 and 16 mmHg, respectively with a dimensionless index of 0.43 and an aortic valve area of 2.1 cm.  LV function appears in the low normal range.  EKG:  EKG is not ordered today.    Recent Labs: 09/28/2020: B Natriuretic Peptide 2,498.6 10/20/2020: Magnesium 1.7 03/20/2021: Hemoglobin 10.3; Platelet Count  185 04/24/2021: ALT 21 05/01/2021: BUN 25; Creatinine, Ser 0.90; Potassium 4.7; Sodium 140; TSH 2.90  Recent Lipid Panel    Component Value Date/Time   CHOL 150 04/24/2021 0836   CHOL 132 08/07/2019 0811   TRIG 94.0 04/24/2021 0836   TRIG 162 (H) 10/25/2005 1507   HDL 77.90 04/24/2021 0836   HDL 61 08/07/2019 0811   CHOLHDL 2 04/24/2021 0836   VLDL 18.8 04/24/2021 0836  LDLCALC 53 04/24/2021 0836   LDLCALC 52 08/07/2019 0811   LDLDIRECT 151.0 01/05/2016 0748     Risk Assessment/Calculations:           Physical Exam:    VS:  BP 130/80   Pulse 71   Ht '5\' 7"'$  (1.702 m)   Wt 227 lb (103 kg)   SpO2 96%   BMI 35.55 kg/m     Wt Readings from Last 3 Encounters:  05/24/21 227 lb (103 kg)  04/27/21 225 lb 12.8 oz (102.4 kg)  04/21/21 226 lb 9.6 oz (102.8 kg)     GEN:  Well nourished, well developed in no acute distress HEENT: Normal NECK: No JVD; No carotid bruits LYMPHATICS: No lymphadenopathy CARDIAC: The heart is regular rate and rhythm with a soft systolic ejection murmur at the right upper sternal border and a 1/6 diastolic decrescendo murmur at the left lower sternal border  RESPIRATORY:  Clear to auscultation without rales, wheezing or rhonchi  ABDOMEN: Soft, non-tender, non-distended MUSCULOSKELETAL:  No edema; No deformity  SKIN: Warm and dry NEUROLOGIC:  Alert and oriented x 3 PSYCHIATRIC:  Normal affect   ASSESSMENT:    1. Nonrheumatic aortic valve insufficiency    PLAN:    In order of problems listed above:  The patient's paravalvular regurgitation appears unchanged in severity based on my personal review of today's echo and directly compared to his previous echo from 6 months ago.  Will await formal interpretation and notify the patient if any other significant changes are identified.  I also think his LV function appears stable with previous LVEF reported at 45 to 50%.  The patient's medical regimen has been adjusted by Dr. Percival Spanish and he is increasing  his Entresto to maximal dose.  He continues on spironolactone and metoprolol succinate.  His most recent labs are reviewed and show a stable hemoglobin at 10.3 mg/dL.  The patient does not exhibit a wide pulse pressure.  Overall he appears to be clinically stable and I have recommended a repeat echo study and structural heart APP visit in 6 months.  If his echo remains stable at that time, he could go to annual echo follow-up and continued longitudinal care with Dr. Percival Spanish.  I emphasized the importance of SBE prophylaxis when indicated.           Medication Adjustments/Labs and Tests Ordered: Current medicines are reviewed at length with the patient today.  Concerns regarding medicines are outlined above.  Orders Placed This Encounter  Procedures   ECHOCARDIOGRAM COMPLETE   No orders of the defined types were placed in this encounter.   Patient Instructions  Medication Instructions:  Your physician recommends that you continue on your current medications as directed. Please refer to the Current Medication list given to you today.  *If you need a refill on your cardiac medications before your next appointment, please call your pharmacy*   Lab Work: NONE If you have labs (blood work) drawn today and your tests are completely normal, you will receive your results only by: [place] (if you have MyChart) OR A paper copy in the mail If you have any lab test that is abnormal or we need to change your treatment, we will call you to review the results.   Testing/Procedures: ECHO Your physician has requested that you have an echocardiogram. Echocardiography is a painless test that uses sound waves to create images of your heart. It provides your doctor with information about the size and shape of your  heart and how well your heart's chambers and valves are working. This procedure takes approximately one hour. There are no restrictions for this procedure.  Follow-Up: At [facility], you and your health needs are our priority.  As part of our continuing mission to provide you with exceptional heart care, we have created designated Provider Care Teams.  These Care Teams include your primary Cardiologist (physician) and Advanced Practice Providers (APPs -  Physician Assistants and Nurse Practitioners) who all work together to provide you with the care you need, when you need it.  We recommend signing up for the patient portal called "MyChart".  Sign up information is provided on this After Visit Summary.  MyChart is used to connect with patients for Virtual Visits (Telemedicine).  Patients are able to view lab/test results, encounter notes, upcoming appointments, etc.  Non-urgent messages can be sent to your provider as well.   To learn more about what you can do with MyChart, go to [email or web address].    Your next appointment:   6 month(s)  The format for your next appointment:   In Person  Provider:   Kathyrn Drown, NP or Nell Range, PA-C      Important Information About Sugar         Signed, Sherren Mocha, MD  05/24/2021 5:20 PM    [place]

## 2021-05-24 NOTE — Patient Instructions (Signed)
Medication Instructions:  Your physician recommends that you continue on your current medications as directed. Please refer to the Current Medication list given to you today.  *If you need a refill on your cardiac medications before your next appointment, please call your pharmacy*   Lab Work: NONE If you have labs (blood work) drawn today and your tests are completely normal, you will receive your results only by: [place] (if you have MyChart) OR A paper copy in the mail If you have any lab test that is abnormal or we need to change your treatment, we will call you to review the results.   Testing/Procedures: ECHO Your physician has requested that you have an echocardiogram. Echocardiography is a painless test that uses sound waves to create images of your heart. It provides your doctor with information about the size and shape of your heart and how well your heart's chambers and valves are working. This procedure takes approximately one hour. There are no restrictions for this procedure.  Follow-Up: At [facility], you and your health needs are our priority.  As part of our continuing mission to provide you with exceptional heart care, we have created designated Provider Care Teams.  These Care Teams include your primary Cardiologist (physician) and Advanced Practice Providers (APPs -  Physician Assistants and Nurse Practitioners) who all work together to provide you with the care you need, when you need it.  We recommend signing up for the patient portal called "MyChart".  Sign up information is provided on this After Visit Summary.  MyChart is used to connect with patients for Virtual Visits (Telemedicine).  Patients are able to view lab/test results, encounter notes, upcoming appointments, etc.  Non-urgent messages can be sent to your provider as well.   To learn more about what you can do with MyChart, go to [email or web address].    Your next appointment:   6  month(s)  The format for your next appointment:   In Person  Provider:   Kathyrn Drown, NP or Nell Range, PA-C      Important Information About Sugar

## 2021-05-31 DIAGNOSIS — D2372 Other benign neoplasm of skin of left lower limb, including hip: Secondary | ICD-10-CM | POA: Diagnosis not present

## 2021-05-31 DIAGNOSIS — C44719 Basal cell carcinoma of skin of left lower limb, including hip: Secondary | ICD-10-CM | POA: Diagnosis not present

## 2021-06-06 NOTE — Progress Notes (Signed)
Patient Care Team: Jinny Sanders, MD as PCP - General (Family Medicine) [name], MD as PCP - Cardiology (Cardiology) Vickie Epley, MD as PCP - Electrophysiology (Cardiology) Lendon Colonel, NP as Nurse Practitioner (Cardiology) Debbora Dus, [facility] as Pharmacist (Pharmacist)  DIAGNOSIS:  Encounter Diagnosis  Name Primary?   Iron deficiency anemia, unspecified iron deficiency anemia type       CHIEF COMPLIANT:  Follow-up of  chronic anemia  INTERVAL HISTORY: Gregory [name] is a 71 y.o. with above-mentioned history of heparin-induced thrombocytopenia and chronic macrocytic anemia. He presents to the clinic today for follow-up. States that he had to get what he thought was a wart removed. States that he has no energy. States when he comes from work he is wiped out.  He is currently on B12 injections once a month.  3 weeks ago he had a basal cancer surgery on his thigh area.  Is recovered very well from that.  He is knee replacement surgery has been postponed until he recovers from this.   ALLERGIES:  is allergic to heparin.  MEDICATIONS:  Current Outpatient Medications  Medication Sig Dispense Refill   acetaminophen (TYLENOL) 500 MG tablet Take 1,000 mg by mouth every 6 (six) hours as needed for moderate pain.     allopurinol (ZYLOPRIM) 100 MG tablet Take 1 tablet by mouth once daily 90 tablet 1   ALPRAZolam (XANAX) 0.5 MG tablet TAKE ONE TABLET BY MOUTH ONCE DAILY AS NEEDED FOR ANIXETY. TAKE 30 MINUTES PRIOR TO PLANE FLIGHT OR AS NEEDED FOR ANXIETY 20 tablet 0   aspirin EC 81 MG tablet Take 1 tablet (81 mg total) by mouth daily. Swallow whole. 30 tablet 11   atorvastatin (LIPITOR) 80 MG tablet Take 1 tablet by mouth once daily 90 tablet 0   Biotin 10 MG CAPS Take by mouth.     colchicine 0.6 MG tablet Take 2 tablets by mouth once daily 60 tablet 2   furosemide (LASIX) 20 MG tablet TAKE TWO TABLETS BY MOUTH IN THE MORNING AND TAKE ONE TABLET BY MOUTH IN THE  AFTERNOON 180 tablet 0   ketoconazole (NIZORAL) 2 % shampoo Apply 1 application topically 2 (two) times a week. 120 mL 1   latanoprost (XALATAN) 0.005 % ophthalmic solution Place 1 drop into both eyes every morning.      metoprolol succinate (TOPROL-XL) 25 MG 24 hr tablet Take 1 tablet (25 mg total) by mouth 2 (two) times daily. 180 tablet 3   Misc Natural Products (OSTEO BI-FLEX/5-LOXIN ADVANCED) TABS Take 2 tablets by mouth daily.     potassium chloride SA (KLOR-CON) 20 MEQ tablet Take 1 tablet (20 mEq total) by mouth daily. 90 tablet 2   sacubitril-valsartan (ENTRESTO) 97-103 MG Take 1 tablet by mouth 2 (two) times daily. 60 tablet 11   sertraline (ZOLOFT) 50 MG tablet Take 1 tablet (50 mg total) by mouth daily. 90 tablet 1   spironolactone (ALDACTONE) 25 MG tablet Take 1 tablet (25 mg total) by mouth daily. 90 tablet 3   traMADol (ULTRAM) 50 MG tablet TAKE 1 TABLET BY MOUTH EVERY 4 HOURS AS NEEDED FOR PAIN 30 tablet 0   traZODone (DESYREL) 50 MG tablet Take 0.5-1 tablets (25-50 mg total) by mouth at bedtime as needed for sleep. 90 tablet 1   No current facility-administered medications for this visit.   Facility-Administered Medications Ordered in Other Visits  Medication Dose Route Frequency Provider Last Rate Last Admin   cyanocobalamin ((VITAMIN B-12)) injection  1,000 mcg  1,000 mcg Intramuscular Once Nicholas Lose, MD        PHYSICAL EXAMINATION: ECOG PERFORMANCE STATUS: 1 - Symptomatic but completely ambulatory  Vitals:   06/20/21 0832  BP: (!) 146/68  Pulse: 70  Resp: 18  Temp: (!) 97.2 F (36.2 C)  SpO2: 99%   Filed Weights   06/20/21 0832  Weight: 227 lb 1.6 oz (103 kg)      LABORATORY DATA:  I have reviewed the data as listed    Latest Ref Rng & Units 05/01/2021    8:07 AM 04/24/2021    8:36 AM 03/20/2021    8:28 AM  CMP  Glucose 70 - 99 mg/dL 88  98  106   BUN 6 - 23 mg/dL '25  24  17   '$ Creatinine 0.40 - 1.50 mg/dL 0.90  0.86  0.91   Sodium 135 - 145 mEq/L 140   139  141   Potassium 3.5 - 5.1 mEq/L 4.7  5.4 No hemolysis seen  4.5   Chloride 96 - 112 mEq/L 105  103  107   CO2 19 - 32 mEq/L '28  29  28   '$ Calcium 8.4 - 10.5 mg/dL 8.8  9.2  9.0   Total Protein 6.0 - 8.3 g/dL  6.5  6.7   Total Bilirubin 0.2 - 1.2 mg/dL  1.3  1.3   Alkaline Phos 39 - 117 U/L  43  39   AST 0 - 37 U/L  54  54   ALT 0 - 53 U/L  21  22     Lab Results  Component Value Date   WBC 4.8 06/20/2021   HGB 10.0 (L) 06/20/2021   HCT 30.7 (L) 06/20/2021   MCV 100.3 (H) 06/20/2021   PLT 191 06/20/2021   NEUTROABS 3.0 06/20/2021    ASSESSMENT & PLAN:  Iron deficiency anemia Work-up: TSH: 2.56: Normal CMP: AST 43, ALT 24, bilirubin 1.9, LDH 1004 (direct Coombs test negative) SPEP: No M protein Reticulocyte count: 3.8%, absolute reticulocyte count 122, reticulocyte hemoglobin: 23.9 (suggestive of iron deficiency or ineffective erythropoiesis) G92: 119, folic acid 41.7 Erythropoietin: 235.3 09/16/2020: Iron studies revealed ferritin of 14 and iron saturation of 5.3% (IV iron treatment)   Elevated LDH with normal direct Coombs test: Concern for intravascular hemolysis possibly from severe valvular heart disease   11/17/2020: Hemoglobin 9.6, WBC 3.3, platelets 142, ANC 1.9, ferritin 158, iron saturation 31% (improv from 5%) 01/13/21: Hb 9.9, WBC: 5.5, MCV 105.6, Platelet 165, TB 1.3, Ferritin 50, Iron Sat: 25%,  01/16/2021: Hemoglobin 9.2, MCV 105.1, B12 117 (start B12 injections) 02/13/2021: Hemoglobin 10, MCV 103.1,   06/20/2021: Hemoglobin 10, MCV 100.3   Knee replacement surgery: Has been postponed because of his basal cancer skin surgery 3 weeks ago. He was prescribed testosterone injections and he has not taken any of those yet.  He is waiting for those to help him improve his energy. He will come monthly for B12 injections and in 3 months with labs and MD visit.  Orders Placed This Encounter  Procedures   CBC with Differential ([place] Only)    Standing Status:    Future    Standing Expiration Date:   06/21/2022   Ferritin    Standing Status:   Future    Standing Expiration Date:   06/20/2022   Iron and Iron Binding Capacity ([facility] only)    Standing Status:   Future    Standing Expiration Date:   06/21/2022  Vitamin B12    Standing Status:   Future    Standing Expiration Date:   06/21/2022   Retic Panel    Standing Status:   Future    Standing Expiration Date:   06/21/2022   The patient has a good understanding of the overall plan. he agrees with it. he will call with any problems that may develop before the next visit here. Total time spent: 30 mins including face to face time and time spent for planning, charting and co-ordination of care   Harriette Ohara, MD 06/20/21    I Gardiner Coins am scribing for Dr. Lindi Adie  I have reviewed the above documentation for accuracy and completeness, and I agree with the above.

## 2021-06-07 ENCOUNTER — Other Ambulatory Visit: Payer: Self-pay | Admitting: Family Medicine

## 2021-06-07 NOTE — Telephone Encounter (Signed)
Last office visit 04/27/21 for [place].  Last refilled 02/16/21 for #30 with no refills.  No future appointments with PCP.

## 2021-06-07 NOTE — Assessment & Plan Note (Signed)
Stable, chronic.  Continue current medication.    

## 2021-06-07 NOTE — Assessment & Plan Note (Signed)
Chronic, well controlled  LDL at goal less than 70 on atorvastatin 80 mg daily

## 2021-06-07 NOTE — Assessment & Plan Note (Signed)
Chronic, stable  On anticoagulant followed by cardiology

## 2021-06-07 NOTE — Assessment & Plan Note (Signed)
Stable, chronic.  Continue current medication.    sertraline 50 mg daily

## 2021-06-07 NOTE — Assessment & Plan Note (Signed)
Chronic, no recent flares.  Continue allopurinol 100 mg p.o. daily use colchicine as needed for flare

## 2021-06-12 DIAGNOSIS — R948 Abnormal results of function studies of other organs and systems: Secondary | ICD-10-CM | POA: Diagnosis not present

## 2021-06-12 DIAGNOSIS — E291 Testicular hypofunction: Secondary | ICD-10-CM | POA: Diagnosis not present

## 2021-06-13 ENCOUNTER — Telehealth: Payer: Self-pay | Admitting: Family Medicine

## 2021-06-13 NOTE — Telephone Encounter (Signed)
Misty from [employer] called and said that patient saw Dr Lovena Neighbours yesterday and they are requesting the most recent testoterone level faxed over so they can compare with the new ones they did there. Callback: [phone] Fax: [phone]

## 2021-06-13 NOTE — Telephone Encounter (Signed)
Results faxed to Dr. Jackson Latino as requested.

## 2021-06-19 ENCOUNTER — Ambulatory Visit: Payer: Medicare HMO

## 2021-06-20 ENCOUNTER — Other Ambulatory Visit: Payer: Self-pay

## 2021-06-20 ENCOUNTER — Inpatient Hospital Stay: Payer: Medicare HMO | Admitting: Hematology and Oncology

## 2021-06-20 ENCOUNTER — Inpatient Hospital Stay: Payer: Medicare HMO

## 2021-06-20 ENCOUNTER — Inpatient Hospital Stay: Payer: Medicare HMO | Attending: Hematology and Oncology

## 2021-06-20 DIAGNOSIS — E538 Deficiency of other specified B group vitamins: Secondary | ICD-10-CM | POA: Insufficient documentation

## 2021-06-20 DIAGNOSIS — D509 Iron deficiency anemia, unspecified: Secondary | ICD-10-CM

## 2021-06-20 LAB — FERRITIN: Ferritin: 16 ng/mL — ABNORMAL LOW (ref 24–336)

## 2021-06-20 LAB — CBC WITH DIFFERENTIAL (CANCER CENTER ONLY)
Abs Immature Granulocytes: 0.01 10*3/uL (ref 0.00–0.07)
Basophils Absolute: 0 10*3/uL (ref 0.0–0.1)
Basophils Relative: 0 %
Eosinophils Absolute: 0.1 10*3/uL (ref 0.0–0.5)
Eosinophils Relative: 3 %
HCT: 30.7 % — ABNORMAL LOW (ref 39.0–52.0)
Hemoglobin: 10 g/dL — ABNORMAL LOW (ref 13.0–17.0)
Immature Granulocytes: 0 %
Lymphocytes Relative: 25 %
Lymphs Abs: 1.2 10*3/uL (ref 0.7–4.0)
MCH: 32.7 pg (ref 26.0–34.0)
MCHC: 32.6 g/dL (ref 30.0–36.0)
MCV: 100.3 fL — ABNORMAL HIGH (ref 80.0–100.0)
Monocytes Absolute: 0.5 10*3/uL (ref 0.1–1.0)
Monocytes Relative: 10 %
Neutro Abs: 3 10*3/uL (ref 1.7–7.7)
Neutrophils Relative %: 62 %
Platelet Count: 191 10*3/uL (ref 150–400)
RBC: 3.06 MIL/uL — ABNORMAL LOW (ref 4.22–5.81)
RDW: 14.5 % (ref 11.5–15.5)
WBC Count: 4.8 10*3/uL (ref 4.0–10.5)
nRBC: 0 % (ref 0.0–0.2)

## 2021-06-20 LAB — IRON AND IRON BINDING CAPACITY (CC-WL,HP ONLY)
Iron: 63 ug/dL (ref 45–182)
Saturation Ratios: 15 % — ABNORMAL LOW (ref 17.9–39.5)
TIBC: 409 ug/dL (ref 250–450)
UIBC: 346 ug/dL (ref 117–376)

## 2021-06-20 LAB — CMP (CANCER CENTER ONLY)
ALT: 16 U/L (ref 0–44)
AST: 45 U/L — ABNORMAL HIGH (ref 15–41)
Albumin: 4.2 g/dL (ref 3.5–5.0)
Alkaline Phosphatase: 41 U/L (ref 38–126)
Anion gap: 5 (ref 5–15)
BUN: 20 mg/dL (ref 8–23)
CO2: 29 mmol/L (ref 22–32)
Calcium: 8.9 mg/dL (ref 8.9–10.3)
Chloride: 108 mmol/L (ref 98–111)
Creatinine: 0.85 mg/dL (ref 0.61–1.24)
GFR, Estimated: >60 mL/min (ref >60)
Glucose, Bld: 114 mg/dL — ABNORMAL HIGH (ref 70–99)
Potassium: 4.1 mmol/L (ref 3.5–5.1)
Sodium: 142 mmol/L (ref 135–145)
Total Bilirubin: 1.4 mg/dL — ABNORMAL HIGH (ref 0.3–1.2)
Total Protein: 6.5 g/dL (ref 6.5–8.1)

## 2021-06-20 LAB — VITAMIN B12: Vitamin B-12: 528 pg/mL (ref 180–914)

## 2021-06-20 MED ORDER — CYANOCOBALAMIN 1000 MCG/ML IJ SOLN
1000.0000 ug | Freq: Once | INTRAMUSCULAR | Status: AC
  Administered 2021-06-20: 1000 ug via INTRAMUSCULAR
  Filled 2021-06-20: qty 1

## 2021-06-20 NOTE — Assessment & Plan Note (Signed)
Work-up: TSH: 2.56: Normal CMP: AST 43, ALT 24, bilirubin 1.9, LDH 1004 (direct Coombs test negative) SPEP: No M protein Reticulocyte count: 3.8%, absolute reticulocyte count 122, reticulocyte hemoglobin: 23.9 (suggestive of iron deficiency or ineffective erythropoiesis) W29: 562, folic acid 13.0 Erythropoietin: 235.3 09/16/2020: Iron studies revealed ferritin of 14 and iron saturation of 5.3%(IV iron treatment)  Elevated LDH with normal direct Coombs test: Concern for intravascular hemolysis possibly from severe valvular heart disease  11/17/2020: Hemoglobin 9.6, WBC 3.3, platelets 142, ANC 1.9, ferritin 158, iron saturation 31% (improv from 5%) 01/13/21: Hb 9.9, WBC: 5.5, MCV 105.6, Platelet 165, TB 1.3, Ferritin 50, Iron Sat: 25%, 01/16/2021: Hemoglobin 9.2, MCV 105.1, B12117(start B12 injections) 02/13/2021:Hemoglobin 10, MCV 103.1,   03/20/2021: Hemoglobin 10.3, MCV 102.9  Knee replacement surgery:

## 2021-06-21 ENCOUNTER — Telehealth: Payer: Self-pay | Admitting: Hematology and Oncology

## 2021-06-21 NOTE — Telephone Encounter (Signed)
Scheduled appointment per 6/20 los. Left message.

## 2021-06-28 DIAGNOSIS — E291 Testicular hypofunction: Secondary | ICD-10-CM | POA: Diagnosis not present

## 2021-07-04 ENCOUNTER — Other Ambulatory Visit: Payer: Self-pay | Admitting: Family Medicine

## 2021-07-11 ENCOUNTER — Other Ambulatory Visit: Payer: Self-pay | Admitting: Cardiology

## 2021-07-18 ENCOUNTER — Other Ambulatory Visit: Payer: Self-pay | Admitting: Family Medicine

## 2021-07-18 ENCOUNTER — Inpatient Hospital Stay: Payer: Medicare HMO | Attending: Hematology and Oncology

## 2021-07-19 ENCOUNTER — Other Ambulatory Visit: Payer: Self-pay | Admitting: Physician Assistant

## 2021-07-20 ENCOUNTER — Ambulatory Visit (INDEPENDENT_AMBULATORY_CARE_PROVIDER_SITE_OTHER): Payer: Medicare HMO

## 2021-07-20 DIAGNOSIS — I420 Dilated cardiomyopathy: Secondary | ICD-10-CM

## 2021-07-20 LAB — CUP PACEART REMOTE DEVICE CHECK
Battery Remaining Longevity: 72 mo
Battery Remaining Percentage: 87 %
Battery Voltage: 2.98 V
Brady Statistic AP VP Percent: 73 %
Brady Statistic AP VS Percent: 1 %
Brady Statistic AS VP Percent: 26 %
Brady Statistic AS VS Percent: 1 %
Brady Statistic RA Percent Paced: 73 %
Date Time Interrogation Session: 20230720020059
HighPow Impedance: 53 Ohm
Implantable Lead Implant Date: 20221020
Implantable Lead Implant Date: 20221020
Implantable Lead Implant Date: 20221020
Implantable Lead Location: 753858
Implantable Lead Location: 753859
Implantable Lead Location: 753860
Implantable Pulse Generator Implant Date: 20221020
Lead Channel Impedance Value: 340 Ohm
Lead Channel Impedance Value: 400 Ohm
Lead Channel Impedance Value: 640 Ohm
Lead Channel Pacing Threshold Amplitude: 0.625 V
Lead Channel Pacing Threshold Amplitude: 1 V
Lead Channel Pacing Threshold Amplitude: 1.75 V
Lead Channel Pacing Threshold Pulse Width: 0.5 ms
Lead Channel Pacing Threshold Pulse Width: 0.5 ms
Lead Channel Pacing Threshold Pulse Width: 0.7 ms
Lead Channel Sensing Intrinsic Amplitude: 1.6 mV
Lead Channel Sensing Intrinsic Amplitude: 12 mV
Lead Channel Setting Pacing Amplitude: 1.625
Lead Channel Setting Pacing Amplitude: 2.25 V
Lead Channel Setting Pacing Amplitude: 2.5 V
Lead Channel Setting Pacing Pulse Width: 0.5 ms
Lead Channel Setting Pacing Pulse Width: 0.7 ms
Lead Channel Setting Sensing Sensitivity: 0.5 mV
Pulse Gen Serial Number: 111052020

## 2021-07-26 ENCOUNTER — Encounter: Payer: Self-pay | Admitting: Family Medicine

## 2021-07-27 DIAGNOSIS — E291 Testicular hypofunction: Secondary | ICD-10-CM | POA: Diagnosis not present

## 2021-08-03 DIAGNOSIS — H40013 Open angle with borderline findings, low risk, bilateral: Secondary | ICD-10-CM | POA: Diagnosis not present

## 2021-08-08 ENCOUNTER — Telehealth: Payer: Self-pay

## 2021-08-08 NOTE — Progress Notes (Signed)
    Chronic Care Management Pharmacy Assistant   Name: Gregory [name]  MRN: [id number] DOB: 11-Apr-1950  Reason for Encounter: CCM (Appointment Reminder)  Medications: Outpatient Encounter Medications as of 08/08/2021  Medication Sig   acetaminophen (TYLENOL) 500 MG tablet Take 1,000 mg by mouth every 6 (six) hours as needed for moderate pain.   allopurinol (ZYLOPRIM) 100 MG tablet Take 1 tablet by mouth once daily   ALPRAZolam (XANAX) 0.5 MG tablet TAKE ONE TABLET BY MOUTH ONCE DAILY AS NEEDED FOR ANIXETY. TAKE 30 MINUTES PRIOR TO PLANE FLIGHT OR AS NEEDED FOR ANXIETY   aspirin EC 81 MG tablet Take 1 tablet (81 mg total) by mouth daily. Swallow whole.   atorvastatin (LIPITOR) 80 MG tablet Take 1 tablet by mouth once daily   Biotin 10 MG CAPS Take by mouth.   colchicine 0.6 MG tablet Take 2 tablets by mouth once daily   furosemide (LASIX) 20 MG tablet TAKE TWO TABLETS BY MOUTH IN THE MORNING AND TAKE ONE TABLET BY MOUTH IN THE AFTERNOON   ketoconazole (NIZORAL) 2 % shampoo Apply 1 application topically 2 (two) times a week.   latanoprost (XALATAN) 0.005 % ophthalmic solution Place 1 drop into both eyes every morning.    metoprolol succinate (TOPROL-XL) 25 MG 24 hr tablet Take 1 tablet (25 mg total) by mouth 2 (two) times daily.   Misc Natural Products (OSTEO BI-FLEX/5-LOXIN ADVANCED) TABS Take 2 tablets by mouth daily.   potassium chloride SA (KLOR-CON) 20 MEQ tablet Take 1 tablet (20 mEq total) by mouth daily.   sacubitril-valsartan (ENTRESTO) 97-103 MG Take 1 tablet by mouth 2 (two) times daily.   sertraline (ZOLOFT) 50 MG tablet Take 1 tablet (50 mg total) by mouth daily.   spironolactone (ALDACTONE) 25 MG tablet Take 1 tablet by mouth once daily   traMADol (ULTRAM) 50 MG tablet TAKE 1 TABLET BY MOUTH EVERY 4 HOURS AS NEEDED FOR PAIN   traZODone (DESYREL) 50 MG tablet Take 0.5-1 tablets (25-50 mg total) by mouth at bedtime as needed for sleep.   No facility-administered encounter  medications on file as of 08/08/2021.   Gregory [name] was contacted to remind of upcoming telephone visit with [name] on 08/11/21 at 1:00. Patient was reminded to have any blood glucose and blood pressure readings available for review at appointment.   Patient confirmed appointment.  Are you having any problems with your medications? Yes   Do you have any concerns you like to discuss with the pharmacist? Yes  Patient stated at his last visit with Dr. Diona Browner he told her he was having breast pain. Dr. Diona Browner told him to stop taking so many multivitamins and supplements (per chart 04/27/21 AWV - " HOLD potassium supplement and multivitamin"). Patient told me he stopped eating bananas since then and is only taking his Potassium 20 mg three days a week instead of every day. Patient can not tell a difference and is still experience breast pain. Patient would like to discuss this with [name].   CCM referral has been placed prior to visit?  No   Star Rating Drugs: Medication:    Last Fill: Day Supply Atorvastatin 80 mg   07/09/21 90 Sacubitril-Valsartan 97-103 mg 07/13/21 [facility], CPP notified  Marijean Niemann, [facility] Pharmacy Assistant [phone]

## 2021-08-09 ENCOUNTER — Encounter (INDEPENDENT_AMBULATORY_CARE_PROVIDER_SITE_OTHER): Payer: Self-pay

## 2021-08-11 ENCOUNTER — Ambulatory Visit: Payer: Medicare HMO | Admitting: Pharmacist

## 2021-08-11 ENCOUNTER — Telehealth: Payer: Self-pay | Admitting: Pharmacist

## 2021-08-11 DIAGNOSIS — E785 Hyperlipidemia, unspecified: Secondary | ICD-10-CM

## 2021-08-11 DIAGNOSIS — I420 Dilated cardiomyopathy: Secondary | ICD-10-CM

## 2021-08-11 DIAGNOSIS — I2510 Atherosclerotic heart disease of native coronary artery without angina pectoris: Secondary | ICD-10-CM

## 2021-08-11 DIAGNOSIS — I10 Essential (primary) hypertension: Secondary | ICD-10-CM

## 2021-08-11 MED ORDER — TRAMADOL HCL 50 MG PO TABS: 50.0000 mg | ORAL_TABLET | ORAL | 0 refills | Status: DC | PRN

## 2021-08-11 NOTE — Telephone Encounter (Signed)
Patient requests refill for tramadol 50 mg.  Last eRx 06/07/21 #30 tablets, 0 RF.   [facility] [address], [place] - [address] [address] Phone: [phone] Fax: [phone]

## 2021-08-11 NOTE — Progress Notes (Signed)
Remote ICD transmission.   

## 2021-08-11 NOTE — Progress Notes (Signed)
Chronic Care Management Pharmacy Note  08/11/2021 Name:  Gregory [name] MRN:  [id number] DOB:  May 12, 1950  Summary: CCM f/u visit -Pt reports ongoing breast pain, not improved by stopping potassium, vitamins. Reviewed gynecomastia and breast pain are known side effects of spironolactone -Pt requests refill for tramadol  Recommendations/Changes made from today's visit: -Advised pt to follow up with cardiology regarding spironolactone/breast pain; can consider dose reduction or switch to epleronone -Coordinate with PCP for tramadol refill  Follow up: -[place] will call patient 3 months for HF update -Pharmacist follow up televisit scheduled for 6 months -Nurse AWV is scheduled for 12/2021    Subjective: Gregory [name] is an 71 y.o. year old male who is a primary patient of Bedsole, Amy E, MD.  The CCM team was consulted for assistance with disease management and care coordination needs.    Engaged with patient by telephone for follow up visit in response to provider referral for pharmacy case management and/or care coordination services.   Consent to Services:  The patient was given information about Chronic Care Management services, agreed to services, and gave verbal consent prior to initiation of services.  Please see initial visit note for detailed documentation.   Patient Care Team: Jinny Sanders, MD as PCP - General (Family Medicine) [name], MD as PCP - Cardiology (Cardiology) Vickie Epley, MD as PCP - Electrophysiology (Cardiology) Lendon Colonel, NP as Nurse Practitioner (Cardiology) Charlton Haws, [facility] as Pharmacist (Pharmacist)    Recent office visits:  04/27/21 Dr Diona Browner OV: annual exam - testosterone is low, can consider urology 01/05/21 Dr Diona Browner OV: pre-op exam for TKA. Needs cardiology risk stratification. 09/15/2020 - [name], MD - Video Visit - Patient presented for iron deficiency anemia. Labs: CBC, IBC +  Ferritin. Referral to GI and hematology.  Recent consult visits:  06/13/21 Dr Lovena Neighbours (Urology): low testosterone. Will repeat lab and proceed with testosterone IM injections if low. 05/24/21 Dr Burt Knack (Cardiology): f/u aortic insufficiency stable, no changes. 04/21/21 Dr Percival Spanish (Cardiology): f/u CAD, HF. Increase Entresto to 97-103 mg. 03/20/21 Dr Lindi Adie (Heme/onc): f/u IDA - needs HGB > 11 for TKA. 01/26/20 Dr Quentin Ore (Cardiology): f/u CHF. NYHA II symptoms. No changes. 01/05/21 Dr Percival Spanish (cardiology): f/u AVR. Increased metoprolol from 12.5 mg daily to 25 mg BID. 11/21/20 Dr Lindi Adie (heme/onc): f/u IDA. 11/15/20 Dr Burt Knack (cardology): f/u aortic valve insufficiency. NYHA 1 symptoms. Repeat ECHO in 6 months. 11/02/20 PA Angelena Form (cardiology): f/u s/p BAV. Asymptomatic, no med changes.  Hospital visits:  10/17/20 - 10/21/20 Admission [facility]): aortic valvuplasty and pacemaker insertion.    Objective:  Lab Results  Component Value Date   CREATININE 0.85 06/20/2021   BUN 20 06/20/2021   GFR 85.22 05/01/2021   GFRNONAA >60 06/20/2021   GFRAA >60 03/17/2019   NA 142 06/20/2021   K 4.1 06/20/2021   CALCIUM 8.9 06/20/2021   CO2 29 06/20/2021   GLUCOSE 114 (H) 06/20/2021    Lab Results  Component Value Date/Time   HGBA1C 4.6 04/24/2021 08:36 AM   HGBA1C 4.5 (L) 10/18/2020 03:32 AM   GFR 85.22 05/01/2021 08:07 AM   GFR 86.41 04/24/2021 08:36 AM    Last diabetic Eye exam:  Lab Results  Component Value Date/Time   HMDIABEYEEXA No Retinopathy 04/06/2021 12:00 AM    Last diabetic Foot exam:  Lab Results  Component Value Date/Time   HMDIABFOOTEX done 01/09/2018 12:00 AM     Lab Results  Component Value Date  CHOL 150 04/24/2021   HDL 77.90 04/24/2021   LDLCALC 53 04/24/2021   LDLDIRECT 151.0 01/05/2016   TRIG 94.0 04/24/2021   CHOLHDL 2 04/24/2021       Latest Ref Rng & Units 06/20/2021    8:12 AM 04/24/2021    8:36 AM 03/20/2021    8:28 AM  Hepatic Function  Total  Protein 6.5 - 8.1 g/dL 6.5  6.5  6.7   Albumin 3.5 - 5.0 g/dL 4.2  4.4  4.3   AST 15 - 41 U/L 45  54  54   ALT 0 - 44 U/L _0 Alk Phosphatase 38 - 126 U/L 41  43  39   Total Bilirubin 0.3 - 1.2 mg/dL 1.4  1.3  1.3     Lab Results  Component Value Date/Time   TSH 2.90 05/01/2021 08:07 AM   TSH 1.620 09/28/2020 04:38 PM   TSH 2.560 08/14/2018 12:09 PM   FREET4 0.80 (L) 08/14/2018 12:09 PM       Latest Ref Rng & Units 06/20/2021    8:12 AM 03/20/2021    8:28 AM 02/15/2021    8:38 AM  CBC  WBC 4.0 - 10.5 K/uL 4.8  4.9  4.4   Hemoglobin 13.0 - 17.0 g/dL 10.0  10.3  10.0   Hematocrit 39.0 - 52.0 % 30.7  31.6  30.5   Platelets 150 - 400 K/uL 191  185  182     Lab Results  Component Value Date/Time   VD25OH 44.25 04/24/2021 08:36 AM   VD25OH 41.22 03/21/2020 08:05 AM    Clinical ASCVD: Yes  The ASCVD Risk score (Arnett DK, et al., 2019) failed to calculate for the following reasons:   The patient has a prior MI or stroke diagnosis       04/27/2021   11:23 AM 12/20/2020    9:05 AM 12/18/2019    9:00 AM  Depression screen PHQ 2/9  Decreased Interest 0 0 0  Down, Depressed, Hopeless 0 0 0  PHQ - 2 Score 0 0 0  Altered sleeping   0  Tired, decreased energy   0  Change in appetite   0  Feeling bad or failure about yourself    0  Trouble concentrating   0  Moving slowly or fidgety/restless   0  Suicidal thoughts   0  PHQ-9 Score   0  Difficult doing work/chores   Not difficult at all    Social History   Tobacco Use  Smoking Status Never  Smokeless Tobacco Never   BP Readings from Last 3 Encounters:  06/20/21 (!) 146/68  05/24/21 130/80  04/27/21 126/70   Pulse Readings from Last 3 Encounters:  06/20/21 70  05/24/21 71  04/27/21 69   Wt Readings from Last 3 Encounters:  06/20/21 227 lb 1.6 oz (103 kg)  05/24/21 227 lb (103 kg)  04/27/21 225 lb 12.8 oz (102.4 kg)   BMI Readings from Last 3 Encounters:  06/20/21 35.57 kg/m  05/24/21 35.55 kg/m   04/27/21 35.37 kg/m    Assessment/Interventions: Review of patient past medical history, allergies, medications, health status, including review of consultants reports, laboratory and other test data, was performed as part of comprehensive evaluation and provision of chronic care management services.   SDOH:  (Social Determinants of Health) assessments and interventions performed: No - Done 12/2020   SDOH Screenings   Alcohol Screen: Low Risk  (12/20/2020)   Alcohol Screen  Last Alcohol Screening Score (AUDIT): 2  Depression (PHQ2-9): Low Risk  (04/27/2021)   Depression (PHQ2-9)    PHQ-2 Score: 0  Financial Resource Strain: Low Risk  (12/20/2020)   Overall Financial Resource Strain (CARDIA)    Difficulty of Paying Living Expenses: Not very hard  Recent Concern: Financial Resource Strain - Medium Risk (11/01/2020)   Overall Financial Resource Strain (CARDIA)    Difficulty of Paying Living Expenses: Somewhat hard  Food Insecurity: No Food Insecurity (12/20/2020)   Hunger Vital Sign    Worried About Running Out of Food in the Last Year: Never true    Ran Out of Food in the Last Year: Never true  Housing: Low Risk  (12/20/2020)   Housing    Last Housing Risk Score: 0  Physical Activity: Sufficiently Active (12/20/2020)   Exercise Vital Sign    Days of Exercise per Week: 5 days    Minutes of Exercise per Session: 150+ min  Social Connections: Moderately Isolated (12/20/2020)   Social Connection and Isolation Panel [NHANES]    Frequency of Communication with Friends and Family: Once a week    Frequency of Social Gatherings with Friends and Family: Three times a week    Attends Religious Services: Never    Active Member of Clubs or Organizations: No    Attends Archivist Meetings: Never    Marital Status: Married  Stress: No Stress Concern Present (12/20/2020)   [place]    Feeling of Stress : Not at  all  Tobacco Use: Low Risk  (05/24/2021)   Patient History    Smoking Tobacco Use: Never    Smokeless Tobacco Use: Never    Passive Exposure: Not on file  Transportation Needs: No Transportation Needs (12/20/2020)   PRAPARE - Transportation    Lack of Transportation (Medical): No    Lack of Transportation (Non-Medical): No    CCM Care Plan  Allergies  Allergen Reactions   Heparin     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 Sig Documenting Provider Last Dose Status Informant  acetaminophen (TYLENOL) 500 MG tablet [id number] Yes Take 1,000 mg by mouth every 6 (six) hours as needed for moderate pain. [provider] Taking Active Self  allopurinol (ZYLOPRIM) 100 MG tablet [id number] Yes Take 1 tablet by mouth once daily Bedsole, Amy E, MD Taking Active   ALPRAZolam (XANAX) 0.5 MG tablet [id number] Yes TAKE ONE TABLET BY MOUTH ONCE DAILY AS NEEDED FOR ANIXETY. TAKE 30 MINUTES PRIOR TO PLANE FLIGHT OR AS NEEDED FOR ANXIETY Bedsole, Amy E, MD Taking Active   aspirin EC 81 MG tablet [id number] Yes Take 1 tablet (81 mg total) by mouth daily. Swallow whole. [name], MD Taking Active Self  atorvastatin (LIPITOR) 80 MG tablet [id number] Yes Take 1 tablet by mouth once daily Diona Browner, Amy E, MD Taking Active   Biotin 10 MG CAPS [id number] Yes Take by mouth. [provider] Taking Active   colchicine 0.6 MG tablet [id number] Yes Take 2 tablets by mouth once daily Bedsole, Amy E, MD Taking Active   furosemide (LASIX) 20 MG tablet [id number] Yes TAKE TWO TABLETS BY MOUTH IN THE MORNING AND TAKE ONE TABLET BY MOUTH IN THE Suszanne Finch, MD Taking Active   ketoconazole (NIZORAL) 2 % shampoo [id number] Yes Apply  1 application topically 2 (two) times a week. Jinny Sanders, MD Taking Active   latanoprost (XALATAN) 0.005 % ophthalmic  solution [id number] Yes Place 1 drop into both eyes every morning.  [provider] Taking Active Self  metoprolol succinate (TOPROL-XL) 25 MG 24 hr tablet [id number] Yes Take 1 tablet (25 mg total) by mouth 2 (two) times daily. [name], MD Taking Active   Misc Natural Products (OSTEO BI-FLEX/5-LOXIN ADVANCED) TABS [id number] Yes Take 2 tablets by mouth daily. [provider] Taking Active Self  potassium chloride SA (KLOR-CON) 20 MEQ tablet [id number] Yes Take 1 tablet (20 mEq total) by mouth daily. Almyra Deforest, [place] Taking Active Self  sacubitril-valsartan (ENTRESTO) 97-103 MG [id number] Yes Take 1 tablet by mouth 2 (two) times daily. [name], MD Taking Active   sertraline (ZOLOFT) 50 MG tablet [id number] Yes Take 1 tablet (50 mg total) by mouth daily. Jinny Sanders, MD Taking Active   spironolactone (ALDACTONE) 25 MG tablet [id number] Yes Take 1 tablet (25 mg total) by mouth daily. Almyra Deforest, [place] Taking Active Self  traMADol (ULTRAM) 50 MG tablet [id number] Yes Take 1 tablet (50 mg total) by mouth every 4 (four) hours as needed. for pain Bedsole, Amy E, MD Taking Active   traZODone (DESYREL) 50 MG tablet [id number] Yes Take 0.5-1 tablets (25-50 mg total) by mouth at bedtime as needed for sleep. Jinny Sanders, MD Taking Active             Patient Active Problem List   Diagnosis Date Noted   S/P placement of cardiac pacemaker 04/20/2021   Vitamin B 12 deficiency 01/16/2021   Pre-op evaluation 01/05/2021   ICD (implantable cardioverter-defibrillator) in place    S/p aortic valvuloplasty 10/18/2020   Complete heart block ([place]) 10/17/2020   Paravalvular leak (prosthetic valve) 10/10/2020   Severe aortic insufficiency 10/10/2020   Heart block AV complete ([place])    Iron deficiency anemia 09/22/2020   Chronic gout of multiple sites 03/18/2020   Dilated cardiomyopathy ([place]) 03/13/2020   Dyslipidemia 03/13/2020   Coronary artery disease involving native coronary artery of  native heart without angina pectoris 12/12/2019   Pain due to onychomycosis of toenails of both feet 04/23/2019   S/P CABG x 3 04/15/2019   GAD (generalized anxiety disorder) 03/31/2019   Seborrheic dermatitis 03/31/2019   History of heparin-induced thrombocytopenia 03/23/2019   Long term (current) use of anticoagulants 03/23/2019   Atrial fibrillation ([place]) 03/23/2019   S/P aortic valve replacement 03/06/2019   Prediabetes 11/18/2018   Vitamin D deficiency 09/30/2018   Lymphedema 01/13/2018   Grade I diastolic dysfunction 87/86/7672   Situational anxiety 10/30/2016   Bilateral primary osteoarthritis of knee 09/23/2015   Anemia 08/06/2013   Class 1 drug-induced obesity with serious comorbidity and body mass index (BMI) of 34.0 to 34.9 in adult 03/26/2011   Aortic insufficiency 02/15/2011   Essential hypertension 10/09/2006    Immunization History  Administered Date(s) Administered   Fluad Quad(high Dose 65+) 12/22/2019, 01/05/2021   Influenza,inj,Quad PF,6+ Mos 11/11/2018   Moderna Sars-Covid-2 Vaccination 01/27/2019, 02/24/2019, 10/31/2019   Pneumococcal Conjugate-13 01/11/2015   Pneumococcal Polysaccharide-23 04/17/2016   Tdap 02/25/2012    Conditions to be addressed/monitored:  Hypertension, Hyperlipidemia, Heart Failure, Coronary Artery Disease, Anxiety, and Osteoarthritis  Care Plan : [place]  Updates made by Charlton Haws, [place] since 08/11/2021 12:00 AM     Problem: Hypertension, Hyperlipidemia, Heart Failure, Coronary Artery Disease, Anxiety, and Osteoarthritis   Priority: High  Long-Range Goal: Disease Management   Start Date: 11/01/2020  Expected End Date: 02/15/2022  This Visit's Progress: On track  Recent Progress: On track  Priority: High  Note:   Current Barriers:  Possible side effects with spironolactone  Pharmacist Clinical Goal(s):  Patient will contact provider office for questions/concerns as evidenced notation of same in  electronic health record through collaboration with PharmD and provider.   Interventions: 1:1 collaboration with Jinny Sanders, MD regarding development and update of comprehensive plan of care as evidenced by provider attestation and co-signature Inter-disciplinary care team collaboration (see longitudinal plan of care) Comprehensive medication review performed; medication list updated in electronic medical record  Hypertension/CHF (BP goal <130/80) -Controlled - per clinic BP readings; pt reports breast pain for several months, did not improve with stopping vitamins/potassium -TEE EF 30-35% (10/10/20) Pt aware of CHF diagnosis,  -Current treatment: Entresto 97-103 mg BID - Appropriate, Effective, Safe, Query Accessible Metoprolol succinate 25 mg - 1/2 tablet daily - Appropriate, Effective, Safe, Accessible Spironolactone 25 mg daily -Appropriate, Effective, Safe, Accessible Potassium chloride SA 20 mEq daily -Appropriate, Effective, Safe, Accessible Furosemide 20 mg - 2 tab AM, 1 tab PM -Appropriate, Effective, Safe, Accessible -Medications previously tried: none   -Educated on Daily salt intake goal < 2300 mg; Importance of home blood pressure monitoring; -Counseled to monitor BP at home periodically -Reviewed spironolactone side effect gynecomastia and breast pain, advised pt follow up with cardiology; can consider dose reduction or switch to eplerenone (lower incidence of gynecomastia)   Hyperlipidemia/CAD: (LDL goal < 70) -Controlled - LDL 63 -Hx CAD - CABG/AVR 03/2019. S/p balloon valvuloplasty and [employer] CRT-D 10/2020 -Current treatment: Atorvastatin 80 mg daily -Appropriate, Effective, Safe, Accessible Aspirin 81 mg daily -Appropriate, Effective, Safe, Accessible -Medications previously tried: none  -Educated on Cholesterol goals;  -Recommended to continue current medication  Depression/Anxiety (Goal: Improve mood) -Controlled - per pt report -PHQ9: 0 (12/18/19) - minimal  depression -GAD7: not on file -Current treatment: Sertraline 50 mg daily -Appropriate, Effective, Safe, Accessible Trazodone 50 mg - 1/2-1 tablet PRN sleep (rare use - causes bad dreams) -Appropriate, Effective, Safe, Accessible Alprazolam 0.5 mg - 1 tablet PRN daily (only takes for claustrophobia, rare anxiety, procedures) -Appropriate, Effective, Safe, Accessible -Medications previously tried/failed: none reported -Educated on Benefits of medication for symptom control -Recommended to continue current medication  Osteoarthritis (Goal: manage pain) -Not ideally controlled - pt reports he recently stopped Tylenol due to recent LFT elevations; he is undergoing pre-op evaluation for upcoming TKA; he reports he is almost out of tramadol -Current treatment  Tramadol 50 mg q4h PRN - Appropriate, Effective, Safe, Accessible -Coordinate with PCP for tramadol refill  Health Maintenance -Vaccine gaps: Shingrix, covid booster -Pt reports he recently started biotin OTC. Discussed biotin can interfere with some lab results, it is a good idea to hold it for a few days before labwork.  Patient Goals/Self-Care Activities Patient will:  - take medications as prescribed as evidenced by patient report and record review focus on medication adherence by routine check blood pressure periodically, document, and provide at future appointments -follow up with cardiology about spironolactone/breast pain        Medication Assistance:  Unable to afford Entresto at $47/month ; States cardiology office does not have any samples. He was given a coupon for first fill. He would like information mailed about Entresto PAP.   Compliance/Adherence/Medication fill history: Care Gaps: None  Star-Rating Drugs: Atorvastatin - PDC 98%  Medication Access: Within the past 30  days, how often has patient missed a dose of medication? 0 Is a pillbox or other method used to improve adherence? Yes  Factors that may affect  medication adherence? no barriers identified Are meds synced by current pharmacy? No  Are meds delivered by current pharmacy? No  Does patient experience delays in picking up medications due to transportation concerns? No   [facility] Reviewed: Is patient disadvantaged to use [facility]?: No  Current Rx insurance plan: [employer] Name and location of Current pharmacy:  [facility] [address], [place] - [place] [address] Phone: [phone] Fax: [phone]  [facility] services reviewed with patient today?: No  Patient requests to transfer care to [facility]?: No  Reason patient declined to change pharmacies: Not mentioned at this visit   Care Plan and Follow Up Patient Decision:  Patient agrees to Care Plan and Follow-up.  Follow Up Plan: Telephone follow up appointment with care management team member scheduled for: 6 months  [name], PharmD, BCACP Clinical Pharmacist [facility] [phone]

## 2021-08-11 NOTE — Patient Instructions (Signed)
Visit Information  Phone number for Pharmacist: [phone]   Goals Addressed   None     Care Plan : [place]  Updates made by Charlton Haws, RPH since 08/11/2021 12:00 AM     Problem: Hypertension, Hyperlipidemia, Heart Failure, Coronary Artery Disease, Anxiety, and Osteoarthritis   Priority: High     Long-Range Goal: Disease Management   Start Date: 11/01/2020  Expected End Date: 02/15/2022  This Visit's Progress: On track  Recent Progress: On track  Priority: High  Note:   Current Barriers:  Possible side effects with spironolactone  Pharmacist Clinical Goal(s):  Patient will contact provider office for questions/concerns as evidenced notation of same in electronic health record through collaboration with PharmD and provider.   Interventions: 1:1 collaboration with Jinny Sanders, MD regarding development and update of comprehensive plan of care as evidenced by provider attestation and co-signature Inter-disciplinary care team collaboration (see longitudinal plan of care) Comprehensive medication review performed; medication list updated in electronic medical record  Hypertension/CHF (BP goal <130/80) -Controlled - per clinic BP readings; pt reports breast pain for several months, did not improve with stopping vitamins/potassium -TEE EF 30-35% (10/10/20) Pt aware of CHF diagnosis,  -Current treatment: Entresto 97-103 mg BID - Appropriate, Effective, Safe, Query Accessible Metoprolol succinate 25 mg - 1/2 tablet daily - Appropriate, Effective, Safe, Accessible Spironolactone 25 mg daily -Appropriate, Effective, Safe, Accessible Potassium chloride SA 20 mEq daily -Appropriate, Effective, Safe, Accessible Furosemide 20 mg - 2 tab AM, 1 tab PM -Appropriate, Effective, Safe, Accessible -Medications previously tried: none   -Educated on Daily salt intake goal < 2300 mg; Importance of home blood pressure monitoring; -Counseled to monitor BP at home  periodically -Reviewed spironolactone side effect gynecomastia and breast pain, advised pt follow up with cardiology; can consider dose reduction or switch to eplerenone (lower incidence of gynecomastia)   Hyperlipidemia/CAD: (LDL goal < 70) -Controlled - LDL 63 -Hx CAD - CABG/AVR 03/2019. S/p balloon valvuloplasty and [employer] CRT-D 10/2020 -Current treatment: Atorvastatin 80 mg daily -Appropriate, Effective, Safe, Accessible Aspirin 81 mg daily -Appropriate, Effective, Safe, Accessible -Medications previously tried: none  -Educated on Cholesterol goals;  -Recommended to continue current medication  Depression/Anxiety (Goal: Improve mood) -Controlled - per pt report -PHQ9: 0 (12/18/19) - minimal depression -GAD7: not on file -Current treatment: Sertraline 50 mg daily -Appropriate, Effective, Safe, Accessible Trazodone 50 mg - 1/2-1 tablet PRN sleep (rare use - causes bad dreams) -Appropriate, Effective, Safe, Accessible Alprazolam 0.5 mg - 1 tablet PRN daily (only takes for claustrophobia, rare anxiety, procedures) -Appropriate, Effective, Safe, Accessible -Medications previously tried/failed: none reported -Educated on Benefits of medication for symptom control -Recommended to continue current medication  Osteoarthritis (Goal: manage pain) -Not ideally controlled - pt reports he recently stopped Tylenol due to recent LFT elevations; he is undergoing pre-op evaluation for upcoming TKA; he reports he is almost out of tramadol -Current treatment  Tramadol 50 mg q4h PRN - Appropriate, Effective, Safe, Accessible -Coordinate with PCP for tramadol refill  Health Maintenance -Vaccine gaps: Shingrix, covid booster -Pt reports he recently started biotin OTC. Discussed biotin can interfere with some lab results, it is a good idea to hold it for a few days before labwork.  Patient Goals/Self-Care Activities Patient will:  - take medications as prescribed as evidenced by patient report and  record review focus on medication adherence by routine check blood pressure periodically, document, and provide at future appointments -follow up with cardiology about spironolactone/breast pain  Patient verbalizes understanding of instructions and care plan provided today and agrees to view in [place]. Active MyChart status and patient understanding of how to access instructions and care plan via MyChart confirmed with patient.    Telephone follow up appointment with pharmacy team member scheduled for: 6 months  [name], PharmD, [facility] Clinical Pharmacist [facility] [phone]

## 2021-08-15 ENCOUNTER — Inpatient Hospital Stay: Payer: Medicare HMO | Attending: Hematology and Oncology

## 2021-08-15 ENCOUNTER — Other Ambulatory Visit: Payer: Self-pay

## 2021-08-15 DIAGNOSIS — E538 Deficiency of other specified B group vitamins: Secondary | ICD-10-CM | POA: Diagnosis not present

## 2021-08-15 MED ORDER — CYANOCOBALAMIN 1000 MCG/ML IJ SOLN
1000.0000 ug | Freq: Once | INTRAMUSCULAR | Status: AC
  Administered 2021-08-15: 1000 ug via INTRAMUSCULAR
  Filled 2021-08-15: qty 1

## 2021-08-31 ENCOUNTER — Other Ambulatory Visit: Payer: Self-pay | Admitting: Cardiology

## 2021-09-01 DIAGNOSIS — J1189 Influenza due to unidentified influenza virus with other manifestations: Secondary | ICD-10-CM | POA: Diagnosis not present

## 2021-09-08 NOTE — Progress Notes (Signed)
Patient Care Team: Jinny Sanders, MD as PCP - General (Family Medicine) [name], MD as PCP - Cardiology (Cardiology) Vickie Epley, MD as PCP - Electrophysiology (Cardiology) Lendon Colonel, NP as Nurse Practitioner (Cardiology) Charlton Haws, [facility] as Pharmacist (Pharmacist)  DIAGNOSIS: No diagnosis found.  SUMMARY OF ONCOLOGIC HISTORY: Oncology History   No history exists.    CHIEF COMPLIANT:  Follow-up of  chronic anemia  INTERVAL HISTORY: Gregory [name] is a 71 y.o. with above-mentioned history of heparin-induced thrombocytopenia and chronic macrocytic anemia. He presents to the clinic today for follow-up.    ALLERGIES:  is allergic to heparin.  MEDICATIONS:  Current Outpatient Medications  Medication Sig Dispense Refill   acetaminophen (TYLENOL) 500 MG tablet Take 1,000 mg by mouth every 6 (six) hours as needed for moderate pain.     allopurinol (ZYLOPRIM) 100 MG tablet Take 1 tablet by mouth once daily 90 tablet 1   ALPRAZolam (XANAX) 0.5 MG tablet TAKE ONE TABLET BY MOUTH ONCE DAILY AS NEEDED FOR ANIXETY. TAKE 30 MINUTES PRIOR TO PLANE FLIGHT OR AS NEEDED FOR ANXIETY 20 tablet 0   aspirin EC 81 MG tablet Take 1 tablet (81 mg total) by mouth daily. Swallow whole. 30 tablet 11   atorvastatin (LIPITOR) 80 MG tablet Take 1 tablet by mouth once daily 90 tablet 3   Biotin 10 MG CAPS Take by mouth.     colchicine 0.6 MG tablet Take 2 tablets by mouth once daily 60 tablet 2   furosemide (LASIX) 20 MG tablet TAKE 2 TABLETS BY MOUTH ONCE DAILY IN THE MORNING AND 1 TAB ONCE DAILY IN THE AFTERNOON 180 tablet 2   ketoconazole (NIZORAL) 2 % shampoo Apply 1 application topically 2 (two) times a week. 120 mL 1   latanoprost (XALATAN) 0.005 % ophthalmic solution Place 1 drop into both eyes every morning.      metoprolol succinate (TOPROL-XL) 25 MG 24 hr tablet Take 1 tablet (25 mg total) by mouth 2 (two) times daily. 180 tablet 3   Misc Natural Products (OSTEO  BI-FLEX/5-LOXIN ADVANCED) TABS Take 2 tablets by mouth daily.     potassium chloride SA (KLOR-CON) 20 MEQ tablet Take 1 tablet (20 mEq total) by mouth daily. 90 tablet 2   sacubitril-valsartan (ENTRESTO) 97-103 MG Take 1 tablet by mouth 2 (two) times daily. 60 tablet 11   sertraline (ZOLOFT) 50 MG tablet Take 1 tablet (50 mg total) by mouth daily. 90 tablet 1   spironolactone (ALDACTONE) 25 MG tablet Take 1 tablet by mouth once daily 90 tablet 3   traMADol (ULTRAM) 50 MG tablet Take 1 tablet (50 mg total) by mouth every 4 (four) hours as needed. for pain 30 tablet 0   traZODone (DESYREL) 50 MG tablet Take 0.5-1 tablets (25-50 mg total) by mouth at bedtime as needed for sleep. 90 tablet 1   No current facility-administered medications for this visit.    PHYSICAL EXAMINATION: ECOG PERFORMANCE STATUS: {CHL ONC ECOG PS:[id number]}  There were no vitals filed for this visit. There were no vitals filed for this visit.  BREAST:*** No palpable masses or nodules in either right or left breasts. No palpable axillary supraclavicular or infraclavicular adenopathy no breast tenderness or nipple discharge. (exam performed in the presence of a chaperone)  LABORATORY DATA:  I have reviewed the data as listed    Latest Ref Rng & Units 06/20/2021    8:12 AM 05/01/2021    8:07 AM 04/24/2021  8:36 AM  CMP  Glucose 70 - 99 mg/dL 114  88  98   BUN 8 - 23 mg/dL '20  25  24   '$ Creatinine 0.61 - 1.24 mg/dL 0.85  0.90  0.86   Sodium 135 - 145 mmol/L 142  140  139   Potassium 3.5 - 5.1 mmol/L 4.1  4.7  5.4 No hemolysis seen   Chloride 98 - 111 mmol/L 108  105  103   CO2 22 - 32 mmol/L '29  28  29   '$ Calcium 8.9 - 10.3 mg/dL 8.9  8.8  9.2   Total Protein 6.5 - 8.1 g/dL 6.5   6.5   Total Bilirubin 0.3 - 1.2 mg/dL 1.4   1.3   Alkaline Phos 38 - 126 U/L 41   43   AST 15 - 41 U/L 45   54   ALT 0 - 44 U/L 16   21     Lab Results  Component Value Date   WBC 4.8 06/20/2021   HGB 10.0 (L) 06/20/2021   HCT 30.7  (L) 06/20/2021   MCV 100.3 (H) 06/20/2021   PLT 191 06/20/2021   NEUTROABS 3.0 06/20/2021    ASSESSMENT & PLAN:  No problem-specific Assessment & Plan notes found for this encounter.    No orders of the defined types were placed in this encounter.  The patient has a good understanding of the overall plan. he agrees with it. he will call with any problems that may develop before the next visit here. Total time spent: 30 mins including face to face time and time spent for planning, charting and co-ordination of care   Suzzette Righter, [place] 09/08/21    I Gardiner Coins am scribing for Dr. Lindi Adie  ***

## 2021-09-11 ENCOUNTER — Other Ambulatory Visit: Payer: Self-pay | Admitting: Physician Assistant

## 2021-09-12 ENCOUNTER — Inpatient Hospital Stay: Payer: Medicare HMO | Attending: Hematology and Oncology | Admitting: Hematology and Oncology

## 2021-09-12 ENCOUNTER — Other Ambulatory Visit: Payer: Self-pay

## 2021-09-12 ENCOUNTER — Inpatient Hospital Stay: Payer: Medicare HMO

## 2021-09-12 DIAGNOSIS — E538 Deficiency of other specified B group vitamins: Secondary | ICD-10-CM | POA: Insufficient documentation

## 2021-09-12 DIAGNOSIS — D509 Iron deficiency anemia, unspecified: Secondary | ICD-10-CM | POA: Diagnosis not present

## 2021-09-12 LAB — CMP (CANCER CENTER ONLY)
ALT: 22 U/L (ref 0–44)
AST: 63 U/L — ABNORMAL HIGH (ref 15–41)
Albumin: 4.4 g/dL (ref 3.5–5.0)
Alkaline Phosphatase: 36 U/L — ABNORMAL LOW (ref 38–126)
Anion gap: 5 (ref 5–15)
BUN: 20 mg/dL (ref 8–23)
CO2: 30 mmol/L (ref 22–32)
Calcium: 9.1 mg/dL (ref 8.9–10.3)
Chloride: 104 mmol/L (ref 98–111)
Creatinine: 0.98 mg/dL (ref 0.61–1.24)
GFR, Estimated: >60 mL/min (ref >60)
Glucose, Bld: 117 mg/dL — ABNORMAL HIGH (ref 70–99)
Potassium: 4.6 mmol/L (ref 3.5–5.1)
Sodium: 139 mmol/L (ref 135–145)
Total Bilirubin: 1.8 mg/dL — ABNORMAL HIGH (ref 0.3–1.2)
Total Protein: 6.7 g/dL (ref 6.5–8.1)

## 2021-09-12 LAB — CBC WITH DIFFERENTIAL (CANCER CENTER ONLY)
Abs Immature Granulocytes: 0.02 10*3/uL (ref 0.00–0.07)
Basophils Absolute: 0 10*3/uL (ref 0.0–0.1)
Basophils Relative: 0 %
Eosinophils Absolute: 0.1 10*3/uL (ref 0.0–0.5)
Eosinophils Relative: 2 %
HCT: 35.7 % — ABNORMAL LOW (ref 39.0–52.0)
Hemoglobin: 11.1 g/dL — ABNORMAL LOW (ref 13.0–17.0)
Immature Granulocytes: 0 %
Lymphocytes Relative: 26 %
Lymphs Abs: 1.3 10*3/uL (ref 0.7–4.0)
MCH: 31.1 pg (ref 26.0–34.0)
MCHC: 31.1 g/dL (ref 30.0–36.0)
MCV: 100 fL (ref 80.0–100.0)
Monocytes Absolute: 0.5 10*3/uL (ref 0.1–1.0)
Monocytes Relative: 10 %
Neutro Abs: 3.1 10*3/uL (ref 1.7–7.7)
Neutrophils Relative %: 62 %
Platelet Count: 190 10*3/uL (ref 150–400)
RBC: 3.57 MIL/uL — ABNORMAL LOW (ref 4.22–5.81)
RDW: 15.3 % (ref 11.5–15.5)
WBC Count: 4.9 10*3/uL (ref 4.0–10.5)
nRBC: 0 % (ref 0.0–0.2)

## 2021-09-12 LAB — IRON AND IRON BINDING CAPACITY (CC-WL,HP ONLY)
Iron: 185 ug/dL — ABNORMAL HIGH (ref 45–182)
Saturation Ratios: 44 % — ABNORMAL HIGH (ref 17.9–39.5)
TIBC: 426 ug/dL (ref 250–450)
UIBC: 241 ug/dL (ref 117–376)

## 2021-09-12 LAB — RETIC PANEL
Immature Retic Fract: 27.9 % — ABNORMAL HIGH (ref 2.3–15.9)
RBC.: 3.58 MIL/uL — ABNORMAL LOW (ref 4.22–5.81)
Retic Count, Absolute: 153.6 10*3/uL (ref 19.0–186.0)
Retic Ct Pct: 4.3 % — ABNORMAL HIGH (ref 0.4–3.1)
Reticulocyte Hemoglobin: 32.7 pg (ref >27.9)

## 2021-09-12 LAB — FERRITIN: Ferritin: 14 ng/mL — ABNORMAL LOW (ref 24–336)

## 2021-09-12 LAB — VITAMIN B12: Vitamin B-12: 482 pg/mL (ref 180–914)

## 2021-09-12 MED ORDER — CYANOCOBALAMIN 1000 MCG/ML IJ SOLN
1000.0000 ug | Freq: Once | INTRAMUSCULAR | Status: AC
  Administered 2021-09-12: 1000 ug via INTRAMUSCULAR
  Filled 2021-09-12: qty 1

## 2021-09-12 NOTE — Patient Instructions (Signed)
Vitamin B12 Deficiency Vitamin B12 deficiency occurs when the body does not have enough of this important vitamin. The body needs this vitamin: To make red blood cells. To make DNA. This is the genetic material inside cells. To help the nerves work properly so they can carry messages from the brain to the body. Vitamin B12 deficiency can cause health problems, such as not having enough red blood cells in the blood (anemia). This can lead to nerve damage if untreated. What are the causes? This condition may be caused by: Not eating enough foods that contain vitamin B12. Not having enough stomach acid and digestive fluids to properly absorb vitamin B12 from the food that you eat. Having certain diseases that make it hard to absorb vitamin B12. These diseases include Crohn's disease, chronic pancreatitis, and cystic fibrosis. An autoimmune disorder in which the body does not make enough of a protein (intrinsic factor) within the stomach, resulting in not enough absorption of vitamin B12. Having a surgery in which part of the stomach or small intestine is removed. Taking certain medicines that make it hard for the body to absorb vitamin B12. These include: Heartburn medicines, such as antacids and proton pump inhibitors. Some medicines that are used to treat diabetes. What increases the risk? The following factors may make you more likely to develop a vitamin B12 deficiency: Being an older adult. Eating a vegetarian or vegan diet that does not include any foods that come from animals. Eating a poor diet while you are pregnant. Taking certain medicines. Having alcoholism. What are the signs or symptoms? In some cases, there are no symptoms of this condition. If the condition leads to anemia or nerve damage, various symptoms may occur, such as: Weakness. Tiredness (fatigue). Loss of appetite. Numbness or tingling in your hands and feet. Redness and burning of the tongue. Depression,  confusion, or memory problems. Trouble walking. If anemia is severe, symptoms can include: Shortness of breath. Dizziness. Rapid heart rate. How is this diagnosed? This condition may be diagnosed with a blood test to measure the level of vitamin B12 in your blood. You may also have other tests, including: A group of tests that measure certain characteristics of blood cells (complete blood count, CBC). A blood test to measure intrinsic factor. A procedure where a thin tube with a camera on the end is used to look into your stomach or intestines (endoscopy). Other tests may be needed to discover the cause of the deficiency. How is this treated? Treatment for this condition depends on the cause. This condition may be treated by: Changing your eating and drinking habits, such as: Eating more foods that contain vitamin B12. Drinking less alcohol or no alcohol. Getting vitamin B12 injections. Taking vitamin B12 supplements by mouth (orally). Your health care provider will tell you which dose is best for you. Follow these instructions at home: Eating and drinking  Include foods in your diet that come from animals and contain a lot of vitamin B12. These include: Meats and poultry. This includes beef, pork, chicken, turkey, and organ meats, such as liver. Seafood. This includes clams, rainbow trout, salmon, tuna, and haddock. Eggs. Dairy foods such as milk, yogurt, and cheese. Eat foods that have vitamin B12 added to them (are fortified), such as ready-to-eat breakfast cereals. Check the label on the package to see if a food is fortified. The items listed above may not be a complete list of foods and beverages you can eat and drink. Contact a dietitian for   more information. Alcohol use Do not drink alcohol if: Your health care provider tells you not to drink. You are pregnant, may be pregnant, or are planning to become pregnant. If you drink alcohol: Limit how much you have to: 0-1 drink a  day for women. 0-2 drinks a day for men. Know how much alcohol is in your drink. In the [place] one drink equals one 12 oz bottle of beer (355 mL), one 5 oz glass of wine (148 mL), or one 1 oz glass of hard liquor (44 mL). General instructions Get vitamin B12 injections if told to by your health care provider. Take supplements only as told by your health care provider. Follow the directions carefully. Keep all follow-up visits. This is important. Contact a health care provider if: Your symptoms come back. Your symptoms get worse or do not improve with treatment. Get help right away: You develop shortness of breath. You have a rapid heart rate. You have chest pain. You become dizzy or you faint. These symptoms may be an emergency. Get help right away. Call 911. Do not wait to see if the symptoms will go away. Do not drive yourself to the hospital. Summary Vitamin B12 deficiency occurs when the body does not have enough of this important vitamin. Common causes include not eating enough foods that contain vitamin B12, not being able to absorb vitamin B12 from the food that you eat, having a surgery in which part of the stomach or small intestine is removed, or taking certain medicines. Eat foods that have vitamin B12 in them. Treatment may include making a change in the way you eat and drink, getting vitamin B12 injections, or taking vitamin B12 supplements. This information is not intended to replace advice given to you by your health care provider. Make sure you discuss any questions you have with your health care provider. Document Revised: 08/12/2020 Document Reviewed: 08/12/2020 Elsevier Patient Education  2023 [employer]  

## 2021-09-12 NOTE — Assessment & Plan Note (Signed)
Work-up: TSH: 2.56: Normal CMP: AST 43, ALT 24, bilirubin 1.9, LDH 1004 (direct Coombs test negative) SPEP: No M protein Reticulocyte count: 3.8%, absolute reticulocyte count 122, reticulocyte hemoglobin: 23.9 (suggestive of iron deficiency or ineffective erythropoiesis) R56: 153, folic acid 79.4 Erythropoietin: 235.3 09/16/2020: Iron studies revealed ferritin of 14 and iron saturation of 5.3%(IV iron treatment)  Elevated LDH with normal direct Coombs test: Concern for intravascular hemolysis possibly from severe valvular heart disease  11/17/2020: Hemoglobin 9.6, WBC 3.3, platelets 142, ANC 1.9, ferritin 158, iron saturation 31% (improv from 5%) 01/13/21: Hb 9.9, WBC: 5.5, MCV 105.6, Platelet 165, TB 1.3, Ferritin 50, Iron Sat: 25%, 01/16/2021: Hemoglobin 9.2, MCV 105.1, B12117(start B12 injections) 02/13/2021:Hemoglobin 10, MCV 103.1, 06/20/2021: Hemoglobin 10, MCV 100.3  09/12/2021:  Knee replacement surgery: Has been postponed because of his basal cancer skin surgery 3 weeks ago. He was prescribed testosterone injections and he has not taken any of those yet.  He is waiting for those to help him improve his energy. He will come monthly for B12 injections and in 3 months with labs and MD visit.

## 2021-10-10 ENCOUNTER — Inpatient Hospital Stay: Payer: Medicare HMO | Attending: Hematology and Oncology

## 2021-10-10 VITALS — BP 140/54 | HR 65 | Temp 97.8°F | Resp 18

## 2021-10-10 DIAGNOSIS — E538 Deficiency of other specified B group vitamins: Secondary | ICD-10-CM

## 2021-10-10 MED ORDER — CYANOCOBALAMIN 1000 MCG/ML IJ SOLN
1000.0000 ug | Freq: Once | INTRAMUSCULAR | Status: AC
  Administered 2021-10-10: 1000 ug via INTRAMUSCULAR
  Filled 2021-10-10: qty 1

## 2021-10-10 NOTE — Patient Instructions (Signed)
Vitamin B12 Injection What is this medication? Vitamin B12 (VAHY tuh min B12) prevents and treats low vitamin B12 levels in your body. It is used in people who do not get enough vitamin B12 from their diet or when their digestive tract does not absorb enough. Vitamin B12 plays an important role in maintaining the health of your nervous system and red blood cells. This medicine may be used for other purposes; ask your health care provider or pharmacist if you have questions. COMMON BRAND NAME(S): B-12 Compliance Kit, B-12 Injection Kit, Cyomin, Dodex, LA-12, Nutri-Twelve, Physicians EZ Use B-12, Primabalt What should I tell my care team before I take this medication? They need to know if you have any of these conditions: Kidney disease Leber's disease Megaloblastic anemia An unusual or allergic reaction to cyanocobalamin, cobalt, other medications, foods, dyes, or preservatives Pregnant or trying to get pregnant Breast-feeding How should I use this medication? This medication is injected into a muscle or deeply under the skin. It is usually given in a clinic or care team's office. However, your care team may teach you how to inject yourself. Follow all instructions. Talk to your care team about the use of this medication in children. Special care may be needed. Overdosage: If you think you have taken too much of this medicine contact a poison control center or emergency room at once. NOTE: This medicine is only for you. Do not share this medicine with others. What if I miss a dose? If you are given your dose at a clinic or care team's office, call to reschedule your appointment. If you give your own injections, and you miss a dose, take it as soon as you can. If it is almost time for your next dose, take only that dose. Do not take double or extra doses. What may interact with this medication? Alcohol Colchicine This list may not describe all possible interactions. Give your health care  provider a list of all the medicines, herbs, non-prescription drugs, or dietary supplements you use. Also tell them if you smoke, drink alcohol, or use illegal drugs. Some items may interact with your medicine. What should I watch for while using this medication? Visit your care team regularly. You may need blood work done while you are taking this medication. You may need to follow a special diet. Talk to your care team. Limit your alcohol intake and avoid smoking to get the best benefit. What side effects may I notice from receiving this medication? Side effects that you should report to your care team as soon as possible: Allergic reactions--skin rash, itching, hives, swelling of the face, lips, tongue, or throat Swelling of the ankles, hands, or feet Trouble breathing Side effects that usually do not require medical attention (report to your care team if they continue or are bothersome): Diarrhea This list may not describe all possible side effects. Call your doctor for medical advice about side effects. You may report side effects to FDA at 1-800-FDA-1088. Where should I keep my medication? Keep out of the reach of children. Store at room temperature between 15 and 30 degrees C (59 and 85 degrees F). Protect from light. Throw away any unused medication after the expiration date. NOTE: This sheet is a summary. It may not cover all possible information. If you have questions about this medicine, talk to your doctor, pharmacist, or health care provider.  2023 Elsevier/Gold Standard (2020-02-25 00:00:00)

## 2021-10-11 ENCOUNTER — Other Ambulatory Visit: Payer: Self-pay | Admitting: Physician Assistant

## 2021-10-17 ENCOUNTER — Other Ambulatory Visit: Payer: Self-pay | Admitting: Family Medicine

## 2021-10-18 ENCOUNTER — Ambulatory Visit: Payer: Medicare HMO | Admitting: Cardiology

## 2021-10-19 ENCOUNTER — Ambulatory Visit (INDEPENDENT_AMBULATORY_CARE_PROVIDER_SITE_OTHER): Payer: Medicare HMO

## 2021-10-19 DIAGNOSIS — I420 Dilated cardiomyopathy: Secondary | ICD-10-CM | POA: Diagnosis not present

## 2021-10-19 LAB — CUP PACEART REMOTE DEVICE CHECK
Battery Remaining Longevity: 72 mo
Battery Remaining Percentage: 84 %
Battery Voltage: 2.96 V
Brady Statistic AP VP Percent: 70 %
Brady Statistic AP VS Percent: 1 %
Brady Statistic AS VP Percent: 29 %
Brady Statistic AS VS Percent: 1 %
Brady Statistic RA Percent Paced: 70 %
Date Time Interrogation Session: 20231019020029
HighPow Impedance: 61 Ohm
Implantable Lead Implant Date: 20221020
Implantable Lead Implant Date: 20221020
Implantable Lead Implant Date: 20221020
Implantable Lead Location: 753858
Implantable Lead Location: 753859
Implantable Lead Location: 753860
Implantable Pulse Generator Implant Date: 20221020
Lead Channel Impedance Value: 390 Ohm
Lead Channel Impedance Value: 430 Ohm
Lead Channel Impedance Value: 790 Ohm
Lead Channel Pacing Threshold Amplitude: 0.625 V
Lead Channel Pacing Threshold Amplitude: 1 V
Lead Channel Pacing Threshold Amplitude: 1.875 V
Lead Channel Pacing Threshold Pulse Width: 0.5 ms
Lead Channel Pacing Threshold Pulse Width: 0.5 ms
Lead Channel Pacing Threshold Pulse Width: 0.7 ms
Lead Channel Sensing Intrinsic Amplitude: 1.7 mV
Lead Channel Sensing Intrinsic Amplitude: 12 mV
Lead Channel Setting Pacing Amplitude: 1.625
Lead Channel Setting Pacing Amplitude: 2.375
Lead Channel Setting Pacing Amplitude: 2.5 V
Lead Channel Setting Pacing Pulse Width: 0.5 ms
Lead Channel Setting Pacing Pulse Width: 0.7 ms
Lead Channel Setting Sensing Sensitivity: 0.5 mV
Pulse Gen Serial Number: 111052020

## 2021-10-24 ENCOUNTER — Encounter: Payer: Self-pay | Admitting: Family Medicine

## 2021-10-28 IMAGING — CT CT ANGIO CHEST
2 of 7 series · 15 of 36 positions shown · IV contrast (APPLIED)
Comparison: Chest CT 03/05/2019.

CLINICAL DATA: 70-year-old male with history of severe aortic
stenosis. Preprocedural study prior to potential transcatheter
aortic valve replacement (TAVR) procedure.

EXAM:
CT ANGIOGRAPHY CHEST, ABDOMEN AND PELVIS
TECHNIQUE: Multidetector CT imaging through the chest, abdomen and pelvis was
performed using the standard protocol during bolus administration of
intravenous contrast. Multiplanar reconstructed images and MIPs were
obtained and reviewed to evaluate the vascular anatomy.
CONTRAST:  100mL OMNIPAQUE IOHEXOL 350 MG/ML SOLN

[Series 3: ax thins · axial · 0.59mm/px · z∈[+824,+1500]mm · 14 of 763 slices shown]
[im 43/763  lung]
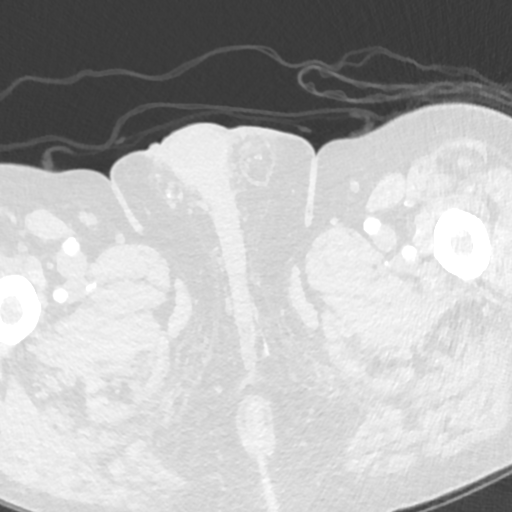
[im 85/763  mediastinal]
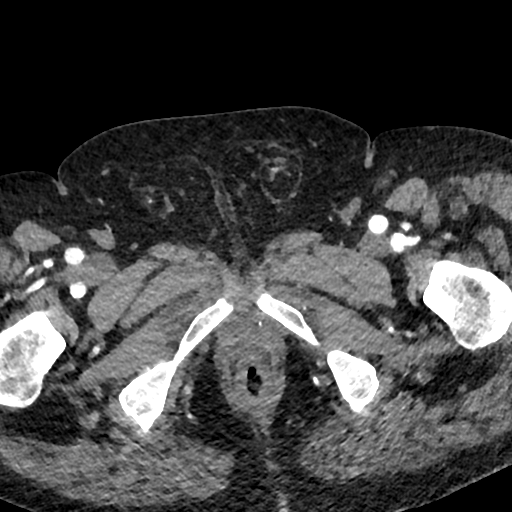
[im 170/763  lung]
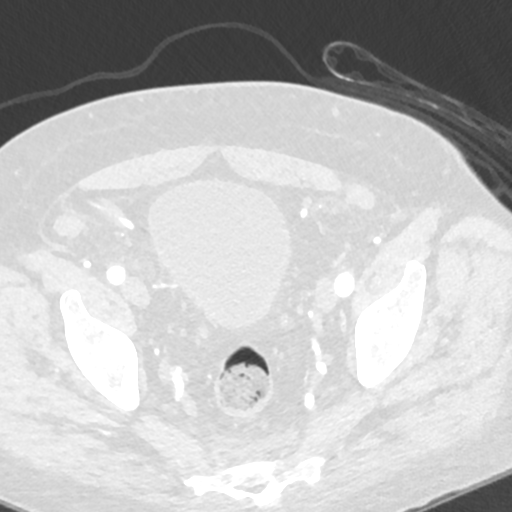
[im 212/763  mediastinal]
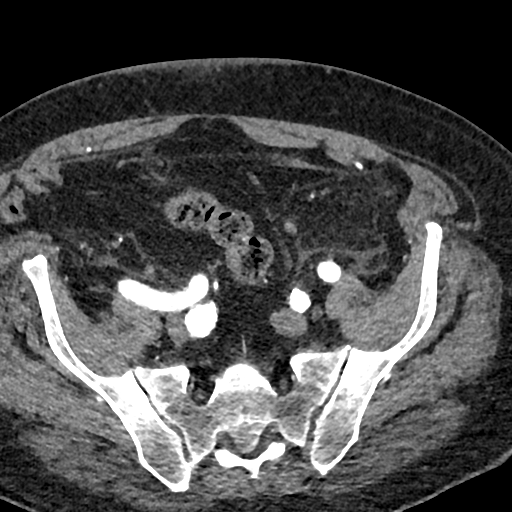
[im 255/763  lung]
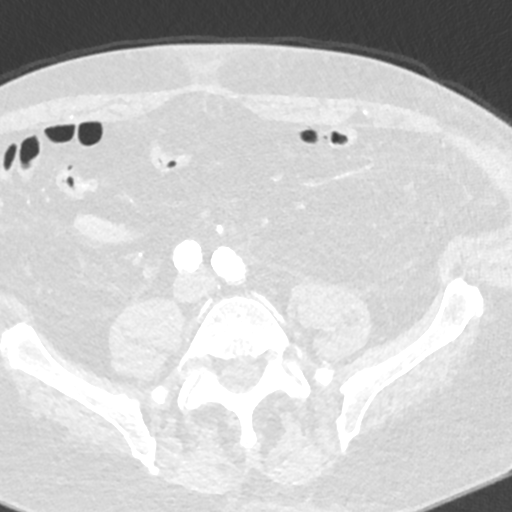
[im 297/763  mediastinal]
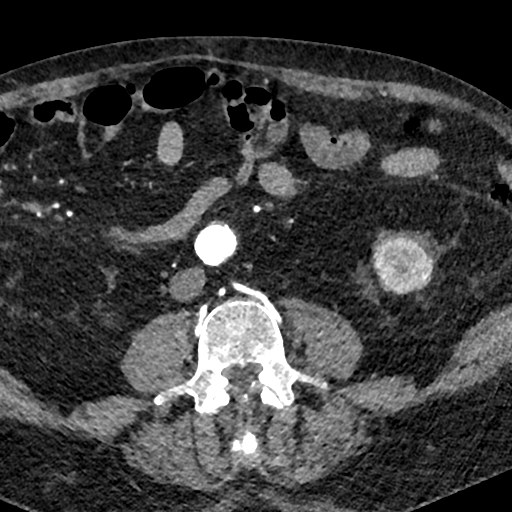
[im 339/763  lung]
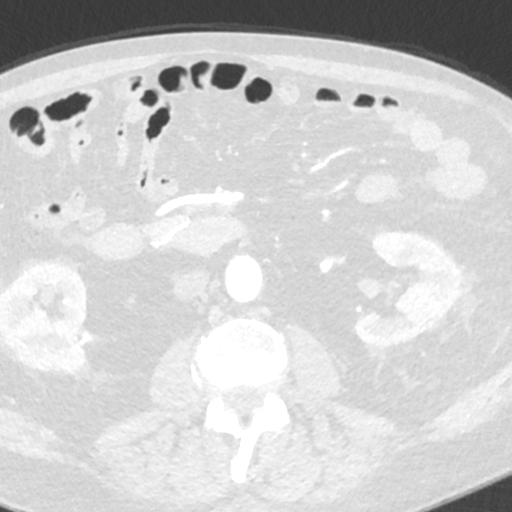
[im 424/763  mediastinal]
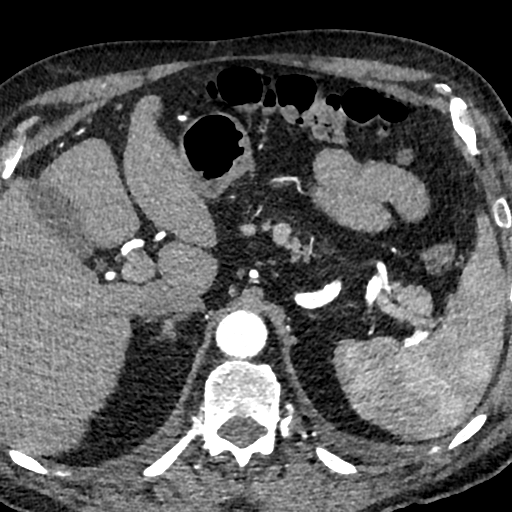
[im 466/763  lung]
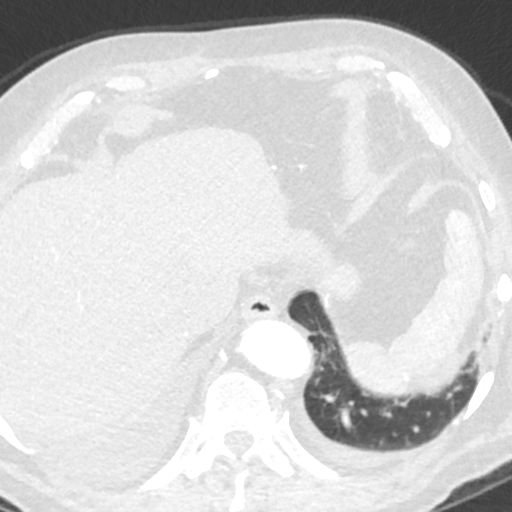
[im 509/763  mediastinal]
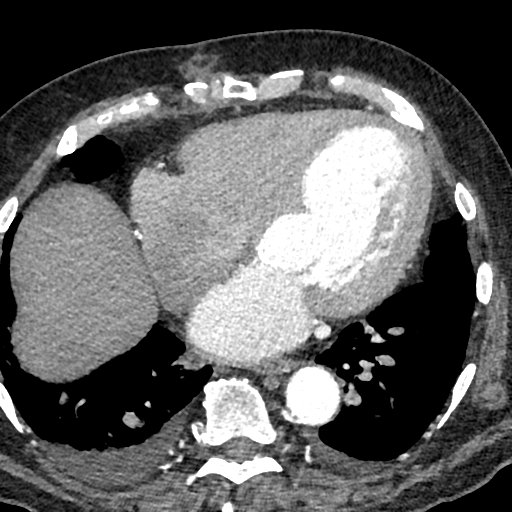
[im 551/763  lung]
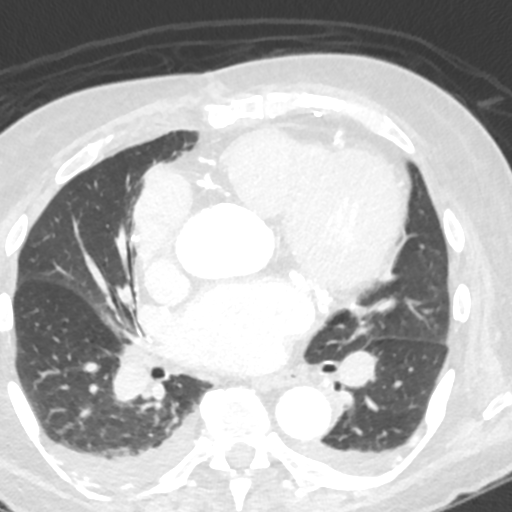
[im 593/763  mediastinal]
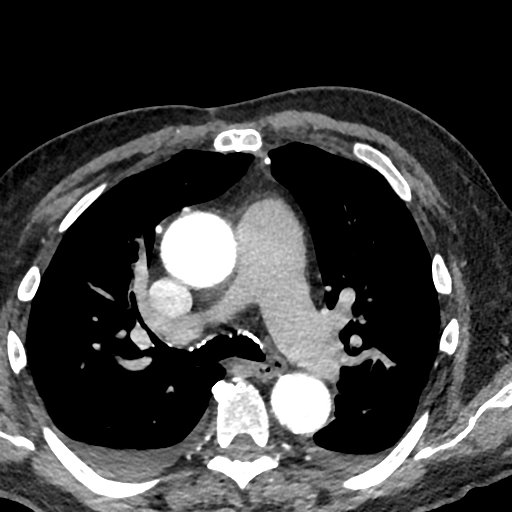
[im 678/763  lung]
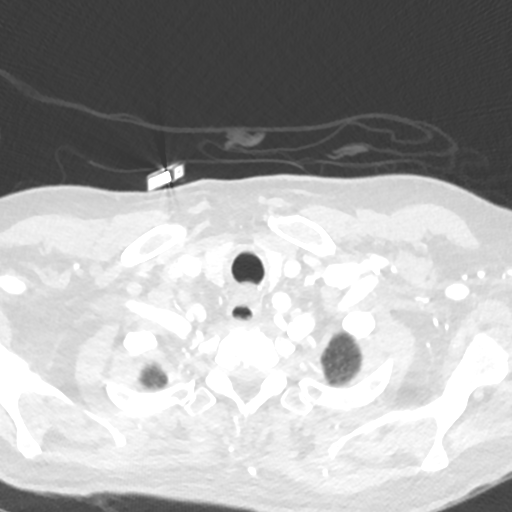
[im 720/763  mediastinal]
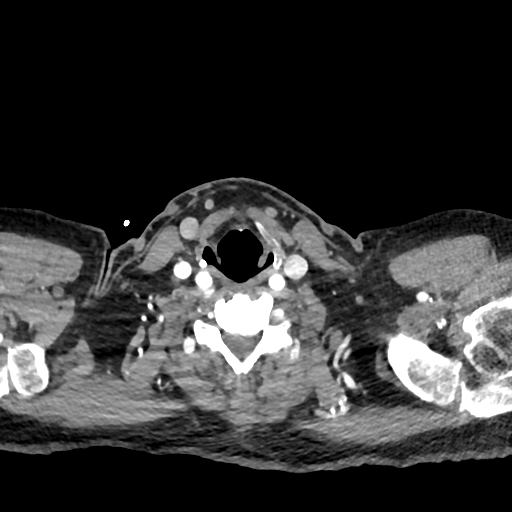

[Series 6: cor · coronal · 0.91mm/px · 1 of 154 slices shown]
[im 77/154  mediastinal]
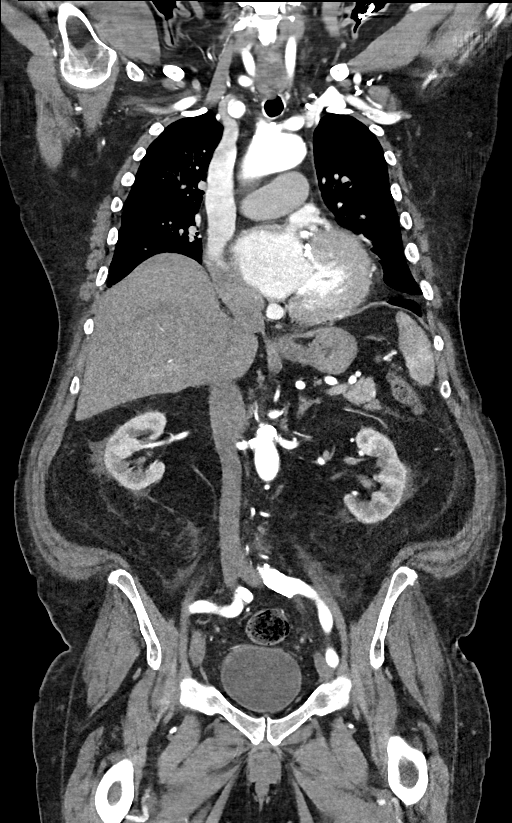

[15 of 36 positions shown; findings below may reference images not displayed]

FINDINGS: CTA CHEST FINDINGS

Cardiovascular: Heart size is mildly enlarged. There is no
significant pericardial fluid, thickening or pericardial
calcification. There is aortic atherosclerosis, as well as
atherosclerosis of the great vessels of the mediastinum and the
coronary arteries, including calcified atherosclerotic plaque in the
left main, left anterior descending, left circumflex and right
coronary arteries. Status post median sternotomy for CABG including
[REDACTED] to the LAD. Patient is also status post aortic valve
replacement with what appears to be a stented bioprosthesis.
Aneurysmal dilatation of the sinuses of Valsalva (5.0 cm in
diameter). Mild aneurysmal dilatation of the ascending thoracic
aorta (4.5 cm in diameter).

Mediastinum/Lymph Nodes: No pathologically enlarged mediastinal or
hilar lymph nodes. Esophagus is unremarkable in appearance. No
axillary lymphadenopathy.

Lungs/Pleura: No suspicious appearing pulmonary nodules or masses
are noted. No acute consolidative airspace disease. Small bilateral
pleural effusions lying dependently.

Musculoskeletal/Soft Tissues: There are no aggressive appearing
lytic or blastic lesions noted in the visualized portions of the
skeleton.

CTA ABDOMEN AND PELVIS FINDINGS

Hepatobiliary: No suspicious cystic or solid hepatic lesions. No
intra or extrahepatic biliary ductal dilatation. Tiny calcified
gallstone lying dependently in the gallbladder. No findings to
suggest an acute cholecystitis at this time.

Pancreas: No pancreatic mass. No pancreatic ductal dilatation. No
pancreatic or peripancreatic fluid collections or inflammatory
changes.

Spleen: Unremarkable.

Adrenals/Urinary Tract: 1.4 cm low-attenuation lesion in the
interpolar region of the left kidney is compatible with a simple
cyst. Mild bilateral renal atrophy. Bilateral perinephric stranding
(nonspecific). Right kidney and bilateral adrenal glands are
otherwise unremarkable in appearance. No hydroureteronephrosis.
Urinary bladder is normal in appearance.

Stomach/Bowel: The appearance of the stomach is normal. No
pathologic dilatation of small bowel or colon. Numerous colonic
diverticulae are noted, without definite surrounding inflammatory
changes to clearly indicate an acute diverticulitis at this time.
Normal appendix.

Vascular/Lymphatic: Aortic atherosclerosis, without evidence of
aneurysm or dissection in the abdominal or pelvic vasculature. No
lymphadenopathy noted in the abdomen or pelvis.

Reproductive: Prostate gland and seminal vesicles are unremarkable
in appearance.

Other: No significant volume of ascites.  No pneumoperitoneum.

Musculoskeletal: There are no aggressive appearing lytic or blastic
lesions noted in the visualized portions of the skeleton.

VASCULAR MEASUREMENTS PERTINENT TO TAVR:

AORTA:

Minimal Aortic 2iameter-99 x 19 mm

Severity of Aortic Calcification-moderate

RIGHT PELVIS:

Right Common Iliac Artery -

Minimal Viameter-11.Q x 13.3 mm

Tortuosity-mild

Calcification-moderate to severe

Right External Iliac Artery -

Minimal Ziameter-AU.T x 11.3 mm

Tortuosity-severe

Calcification-none

Right Common Femoral Artery -

Minimal Jiameter-22.0 x 10.9 mm

Tortuosity-mild

Calcification-mild

LEFT PELVIS:

Left Common Iliac Artery -

Minimal Liameter-IK.Q x 13.9 mm

Tortuosity-mild

Calcification-moderate to severe

Left External Iliac Artery -

Minimal Xiameter-OO.4 x 12.0 mm

Tortuosity-moderate

Calcification-none

Left Common Femoral Artery -

Minimal Ciameter-RX.2 x 10.9 mm

Tortuosity-mild

Calcification-mild

Review of the MIP images confirms the above findings.
IMPRESSION: 1. Vascular findings and measurements pertinent to potential TAVR
procedure, as detailed above.
2. Status post aortic valve replacement with what appears to be a
stented bioprosthesis. Aneurysmal dilatation of the sinuses of
Valsalva (5.0 cm in diameter) and the ascending thoracic aorta (4.5
cm in diameter). Ascending thoracic aortic aneurysm. Recommend
semi-annual imaging followup by CTA or MRA and referral to
cardiothoracic surgery if not already obtained. This recommendation
follows 1757 ACCF/AHA/AATS/ACR/ASA/SCA/PERRAULT/SOMMEI/SHAREN/BOY COOL Guidelines
for the Diagnosis and Management of Patients With Thoracic Aortic
Disease. Circulation. 1757; 121: E266-e369. Aortic aneurysm NOS
(NRBS1-QRN.4).
3. Aortic atherosclerosis, in addition to left main and 3 vessel
coronary artery disease. Status post median sternotomy for CABG
including [REDACTED] to the LAD.
4. Mild cardiomegaly.
5. Small bilateral pleural effusions lying dependently.
6. Colonic diverticulosis without evidence of acute diverticulitis
at this time.
7. Additional incidental findings, as above.

## 2021-10-30 NOTE — Progress Notes (Signed)
Remote ICD transmission.   

## 2021-10-31 ENCOUNTER — Other Ambulatory Visit: Payer: Self-pay | Admitting: Family Medicine

## 2021-10-31 NOTE — Telephone Encounter (Signed)
Last office visit 04/27/21 for [place].  Last refilled alprazolam 11/18/20 for #20 with no refills.  Tramadol 08/11/21 for #30 with no refills.  No future appointments with PCP.

## 2021-11-01 DIAGNOSIS — Z85828 Personal history of other malignant neoplasm of skin: Secondary | ICD-10-CM | POA: Diagnosis not present

## 2021-11-01 DIAGNOSIS — L570 Actinic keratosis: Secondary | ICD-10-CM | POA: Diagnosis not present

## 2021-11-01 DIAGNOSIS — D2261 Melanocytic nevi of right upper limb, including shoulder: Secondary | ICD-10-CM | POA: Diagnosis not present

## 2021-11-01 DIAGNOSIS — D2272 Melanocytic nevi of left lower limb, including hip: Secondary | ICD-10-CM | POA: Diagnosis not present

## 2021-11-01 DIAGNOSIS — D225 Melanocytic nevi of trunk: Secondary | ICD-10-CM | POA: Diagnosis not present

## 2021-11-02 NOTE — Progress Notes (Signed)
Cardiology Office Note   Date:  11/03/2021   ID:  Cleburne, Savini [birth year], MRN [id number]  PCP:  Jinny Sanders, MD  Cardiologist:   [name], MD   Chief Complaint  Patient presents with   Shortness of Breath     History of Present Illness: KEBRON PULSE is a 71 y.o. male for follow up of CAD/CABG and AVR.   He had a cardiac catheterization during recent hospitalization on 03/04/2019 which revealed severe calcific disease in the mid LAD, the circumflex had mild to moderate proximal stenosis, the RCA, a large dominant vessel, had severe proximal stenosis and severe mid stenosis with poor flow into the distal vessel.  He was also found to have severe aortic regurgitation and moderate aortic valve stenosis, with a mean gradient across aortic valve 62mHg with AVA, but judged to be severe aortic insufficiency in the setting of dilated aortic root with high flow from AI leading to elevated gradient across aortic valve.  He underwent coronary artery bypass graft with aortic valve replacement on 03/06/2019.  The patient had LIMA to LAD, left radial artery to PDA, predicted RIMA to the right posterior lateral artery, with open left radial artery harvest, bilateral IMA harvesting.  Also, 25 mm [name] Intuity Bovine bioprosthetic aortic valve placement.  Of note, the patient had a severe reaction to heparin with HIT and severe profound thrombocytopenia postoperatively. He had postoperative atrial fibrillation and therefore was started on amiodarone 200 mg daily.  He had a left arm hematoma and an elevated INR.  He was followed by heme.  On a follow an echo he a perivalvular leak.   He was admitted with HB and heart failure and hs severe paravalvular leak.  He eventually balloon valvuloplasty of his prosthetic valve and CRTD pacing.   On follow up echo the paravalvular leak was moderate.    He presents for follow up.  I last saw him prior to knee surgery.  However, he did not have his  knee done.  He was getting management for evaluation of an surgery which was put on hold but he thinks he will have 1 knee done in December.  He denies any new cardiovascular complaints.  He is on fortunately continuing to be short of breath with activity but he thinks part of that as he is limited by his knee pain.  He still working.  He walks with a cane.  He denies any chest pressure, neck or arm discomfort.  Has not had any palpitations, presyncope or syncope.   Past Medical History:  Diagnosis Date   Alcohol abuse    Anxiety    Aortic valve disease    Arthritis    Coronary artery disease    Glaucoma    Hypertension    OSA (obstructive sleep apnea)    Pre-diabetes    S/P balloon aortic valvuloplasty 10/18/2020   done for bioprosthetic aortic valve replacement perivalvular leaking reducing it from severe to mild.   Shingles     Past Surgical History:  Procedure Laterality Date   AORTIC ARCH ANGIOGRAPHY N/A 10/10/2020   Procedure: AORTIC ARCH ANGIOGRAPHY;  Surgeon: ENelva Bush MD;  [facility];  Service: Cardiovascular;  Laterality: N/A;   AORTIC VALVE REPLACEMENT N/A 03/06/2019   Procedure: AORTIC VALVE REPLACEMENT (AVR), USING INTUITY 25MM;  Surgeon: AWonda Olds MD;  [place] [facility]  Service: Open Heart Surgery;  Laterality: N/A;   BALLOON AORTIC VALVE VALVULOPLASTY N/A 10/18/2020  Procedure: BALLOON AORTIC VALVE VALVULOPLASTY;  Surgeon: Sherren Mocha, MD;  Location: [place];  Service: Cardiovascular;  Laterality: N/A;   BIV PACEMAKER INSERTION CRT-P N/A 10/20/2020   Procedure: BIV PACEMAKER INSERTION CRT-P;  Surgeon: Vickie Epley, MD;  Location: [place] [facility];  Service: Cardiovascular;  Laterality: N/A;   CARDIAC CATHETERIZATION     CATARACT EXTRACTION     CORONARY ARTERY BYPASS GRAFT N/A 03/06/2019   Procedure: CORONARY ARTERY BYPASS GRAFTING (CABG), ON PUMP, TIMES THREE, USING BILATERAL INTERNAL MAMMARIES AND LEFT RADIAL ARTERY HARVEST;   Surgeon: Wonda Olds, MD;  Location: [place];  Service: Open Heart Surgery;  Laterality: N/A;  BILATERAL IMA   LEFT HEART CATH AND CORONARY ANGIOGRAPHY N/A 03/04/2019   Procedure: LEFT HEART CATH AND CORONARY ANGIOGRAPHY;  Surgeon: Burnell Blanks, MD;  Location: [place] [facility];  Service: Cardiovascular;  Laterality: N/A;   None     RADIAL ARTERY HARVEST Left 03/06/2019   Procedure: RADIAL ARTERY HARVEST;  Surgeon: Wonda Olds, MD;  Location: [place];  Service: Open Heart Surgery;  Laterality: Left;   RIGHT HEART CATH AND CORONARY/GRAFT ANGIOGRAPHY N/A 10/10/2020   Procedure: RIGHT HEART CATH AND CORONARY/GRAFT ANGIOGRAPHY;  Surgeon: Nelva Bush, MD;  Location: [place] [facility];  Service: Cardiovascular;  Laterality: N/A;   TEE WITHOUT CARDIOVERSION N/A 03/06/2019   Procedure: TRANSESOPHAGEAL ECHOCARDIOGRAM (TEE);  Surgeon: Wonda Olds, MD;  Location: [place];  Service: Open Heart Surgery;  Laterality: N/A;   TEE WITHOUT CARDIOVERSION N/A 10/10/2020   Procedure: TRANSESOPHAGEAL ECHOCARDIOGRAM (TEE);  Surgeon: Josue Hector, MD;  Location: [facility];  Service: Cardiovascular;  Laterality: N/A;   TEE WITHOUT CARDIOVERSION N/A 10/18/2020   Procedure: TRANSESOPHAGEAL ECHOCARDIOGRAM (TEE);  Surgeon: Sherren Mocha, MD;  Location: [place];  Service: Open Heart Surgery;  Laterality: N/A;   TEMPORARY PACEMAKER N/A 10/17/2020   Procedure: TEMPORARY PACEMAKER;  Surgeon: Evans Lance, MD;  Location: [place] [facility];  Service: Cardiovascular;  Laterality: N/A;     Current Outpatient Medications  Medication Sig Dispense Refill   acetaminophen (TYLENOL) 500 MG tablet Take 1,000 mg by mouth every 6 (six) hours as needed for moderate pain.     allopurinol (ZYLOPRIM) 100 MG tablet Take 1 tablet by mouth once daily 90 tablet 1   ALPRAZolam (XANAX) 0.5 MG tablet TAKE 1 TABLET BY MOUTH ONCE DAILY AS  NEEDED  FOR  ANXIETY  TAKE  30  MINUTES  PRIOR  TO  PLANE FLIGHT OR AS NEEDED  FOR ANXIETY 20 tablet 0   aspirin EC 81 MG tablet Take 1 tablet (81 mg total) by mouth daily. Swallow whole. 30 tablet 11   atorvastatin (LIPITOR) 80 MG tablet Take 1 tablet by mouth once daily 90 tablet 3   Biotin 10 MG CAPS Take by mouth.     colchicine 0.6 MG tablet Take 2 tablets by mouth once daily 60 tablet 2   furosemide (LASIX) 20 MG tablet TAKE 2 TABLETS BY MOUTH ONCE DAILY IN THE MORNING AND 1 TAB ONCE DAILY IN THE AFTERNOON 180 tablet 2   ketoconazole (NIZORAL) 2 % shampoo Apply 1 application topically 2 (two) times a week. 120 mL 1   KLOR-CON M20 20 MEQ tablet Take 1 tablet by mouth once daily 90 tablet 0   latanoprost (XALATAN) 0.005 % ophthalmic solution Place 1 drop into both eyes every morning.      metoprolol succinate (TOPROL-XL) 25 MG 24 hr tablet Take 1 tablet (  25 mg total) by mouth 2 (two) times daily. 180 tablet 3   Misc Natural Products (OSTEO BI-FLEX/5-LOXIN ADVANCED) TABS Take 2 tablets by mouth daily.     sacubitril-valsartan (ENTRESTO) 97-103 MG Take 1 tablet by mouth 2 (two) times daily. 60 tablet 11   sertraline (ZOLOFT) 50 MG tablet Take 1 tablet (50 mg total) by mouth daily. 90 tablet 1   spironolactone (ALDACTONE) 25 MG tablet Take 1 tablet by mouth once daily 90 tablet 1   traMADol (ULTRAM) 50 MG tablet TAKE 1 TABLET BY MOUTH EVERY 4 HOURS AS NEEDED FOR PAIN 30 tablet 0   traZODone (DESYREL) 50 MG tablet Take 0.5-1 tablets (25-50 mg total) by mouth at bedtime as needed for sleep. 90 tablet 1   No current facility-administered medications for this visit.    Allergies:   Heparin    ROS:  Please see the history of present illness.   Otherwise, review of systems are positive for none.   All other systems are reviewed and negative.    PHYSICAL EXAM: VS:  BP 120/60   Pulse 68   Ht '5\' 7"'$  (1.702 m)   Wt 235 lb 12.8 oz (107 kg)   SpO2 90%   BMI 36.93 kg/m  , BMI Body mass index is 36.93 kg/m.  GENERAL:  Well appearing NECK:  No jugular venous distention,  waveform within normal limits, carotid upstroke brisk and symmetric, no bruits, no thyromegaly LUNGS:  Clear to auscultation bilaterally CHEST:  Unremarkable, well-healed sternotomy scar HEART:  PMI not displaced or sustained,S1 and S2 within normal limits, no S3, no S4, no clicks, no rubs, 2 out of 6 apical systolic murmur radiating slightly at the right upper tract, 2 out of 6 diastolic murmur murmurs ABD:  Flat, positive bowel sounds normal in frequency in pitch, no bruits, no rebound, no guarding, no midline pulsatile mass, no hepatomegaly, no splenomegaly EXT:  2 plus pulses throughout, trace bilateral lower extremity edema edema, no cyanosis no clubbing  EKG:  EKG is  ordered today. Atrial ventricular paced rhythm 100% capture   Recent Labs: 05/01/2021: TSH 2.90 09/12/2021: ALT 22; BUN 20; Creatinine 0.98; Hemoglobin 11.1; Platelet Count 190; Potassium 4.6; Sodium 139    Lipid Panel    Component Value Date/Time   CHOL 150 04/24/2021 0836   CHOL 132 08/07/2019 0811   TRIG 94.0 04/24/2021 0836   TRIG 162 (H) 10/25/2005 1507   HDL 77.90 04/24/2021 0836   HDL 61 08/07/2019 0811   CHOLHDL 2 04/24/2021 0836   VLDL 18.8 04/24/2021 0836   LDLCALC 53 04/24/2021 0836   LDLCALC 52 08/07/2019 0811   LDLDIRECT 151.0 01/05/2016 0748      Wt Readings from Last 3 Encounters:  11/03/21 235 lb 12.8 oz (107 kg)  09/12/21 239 lb 4.8 oz (108.5 kg)  06/20/21 227 lb 1.6 oz (103 kg)      Other studies Reviewed: Additional studies/ records that were reviewed today include:   Labs Review of the above records demonstrates:  Please see elsewhere in the note.     ASSESSMENT AND PLAN:  CAD: S/P CABG.  The patient has no new sypmtoms.  He has no new symptoms.  No change in therapy.  No further cardiovascular testing is indicated.  We will continue with aggressive risk reduction and meds as listed.  HIT:   Heparin is listed as an allergy.  S/P Bioprosthetic AoV replacement:    He had stable and  moderate paravalvular AI in May.  Plan is follow up this month.  I did send a message to Dr. Burt Knack.  If they do not plan to see him this fall I do plan to repeat an echo in May.  He understands endocarditis prophylaxis.   Hyperlipidemia:   LDL was 63 with an HDL of 67.  No change in therapy.   Cardiomyopathy: EF was 50 to 55%.  HTN:   Well-controlled.  He is forgetting sometimes to take his Entresto in the afternoon and I encouraged him to set an alarm.   CRTD:   He is up-to-date with follow-up.  I reviewed the 10/19/2021  interrogation for this appt. he has follow-up with Dr. Quentin Ore next week.  Preop: The patient is going to have knee surgery.  He has no high risk symptoms.  He has multiple comorbidities so there is risk related to this but no prohibitive cardiovascular risk according to ACC/AHA guidelines.  No further cardiovascular testing is suggested.  Current medicines are reviewed at length with the patient today.  The patient does not have concerns regarding medicines.  The following changes have been made:    None  Labs/ tests ordered today include:     Orders Placed This Encounter  Procedures   EKG 12-Lead    Disposition:   FU with me in 12 months and he is also following with the EP and Structural.     Signed, [name], MD  11/03/2021 4:19 PM    [facility]

## 2021-11-03 ENCOUNTER — Ambulatory Visit: Payer: Medicare HMO | Attending: Cardiology | Admitting: Cardiology

## 2021-11-03 ENCOUNTER — Encounter: Payer: Self-pay | Admitting: Cardiology

## 2021-11-03 VITALS — BP 120/60 | HR 68 | Ht 67.0 in | Wt 235.8 lb

## 2021-11-03 DIAGNOSIS — Z952 Presence of prosthetic heart valve: Secondary | ICD-10-CM

## 2021-11-03 DIAGNOSIS — I2510 Atherosclerotic heart disease of native coronary artery without angina pectoris: Secondary | ICD-10-CM

## 2021-11-03 DIAGNOSIS — I351 Nonrheumatic aortic (valve) insufficiency: Secondary | ICD-10-CM | POA: Diagnosis not present

## 2021-11-03 DIAGNOSIS — Z95810 Presence of automatic (implantable) cardiac defibrillator: Secondary | ICD-10-CM | POA: Diagnosis not present

## 2021-11-03 DIAGNOSIS — I10 Essential (primary) hypertension: Secondary | ICD-10-CM | POA: Diagnosis not present

## 2021-11-03 DIAGNOSIS — E785 Hyperlipidemia, unspecified: Secondary | ICD-10-CM

## 2021-11-03 NOTE — Patient Instructions (Signed)
Medication Instructions:  No changes *If you need a refill on your cardiac medications before your next appointment, please call your pharmacy*   Lab Work: None ordered If you have labs (blood work) drawn today and your tests are completely normal, you will receive your results only by: MyChart Message (if you have MyChart) OR A paper copy in the mail If you have any lab test that is abnormal or we need to change your treatment, we will call you to review the results.   Testing/Procedures: None ordered   Follow-Up: At [facility], you and your health needs are our priority.  As part of our continuing mission to provide you with exceptional heart care, we have created designated Provider Care Teams.  These Care Teams include your primary Cardiologist (physician) and Advanced Practice Providers (APPs -  Physician Assistants and Nurse Practitioners) who all work together to provide you with the care you need, when you need it.  We recommend signing up for the patient portal called "MyChart".  Sign up information is provided on this After Visit Summary.  MyChart is used to connect with patients for Virtual Visits (Telemedicine).  Patients are able to view lab/test results, encounter notes, upcoming appointments, etc.  Non-urgent messages can be sent to your provider as well.   To learn more about what you can do with MyChart, go to [email or web address].    Your next appointment:   12 month(s)  The format for your next appointment:   In Person  Provider:   James Hochrein, MD      Important Information About Sugar       

## 2021-11-07 ENCOUNTER — Other Ambulatory Visit: Payer: Self-pay

## 2021-11-07 ENCOUNTER — Telehealth: Payer: Self-pay

## 2021-11-07 ENCOUNTER — Inpatient Hospital Stay: Payer: Medicare HMO | Attending: Hematology and Oncology

## 2021-11-07 DIAGNOSIS — E538 Deficiency of other specified B group vitamins: Secondary | ICD-10-CM | POA: Diagnosis not present

## 2021-11-07 MED ORDER — CYANOCOBALAMIN 1000 MCG/ML IJ SOLN
1000.0000 ug | Freq: Once | INTRAMUSCULAR | Status: AC
  Administered 2021-11-07: 1000 ug via INTRAMUSCULAR
  Filled 2021-11-07: qty 1

## 2021-11-07 NOTE — Progress Notes (Signed)
Chronic Care Management Pharmacy Assistant   Name: Gregory [name]  MRN: [id number] DOB: [birth year]  Reason for Encounter: CCM (Hypertension Disease State)  Recent office visits:  None since last CCM contact  Recent consult visits:  11/07/21 Infusion: cyanocobalamin (VITAMIN B12) injection 1,000 mcg 11/03/21 [name], MD (Cardiology) Coronary Artery Disease Ordered: EKG No med changes FU 12 months 10/10/21 Infusion: cyanocobalamin (VITAMIN B12) injection 1,000 mcg 09/12/21 Infusion: cyanocobalamin (VITAMIN B12) injection 1,000 mcg 08/15/21 Infusion: cyanocobalamin (VITAMIN B12) injection 1,000 mcg   Hospital visits:  None in previous 6 months  Medications: Outpatient Encounter Medications as of 11/07/2021  Medication Sig   acetaminophen (TYLENOL) 500 MG tablet Take 1,000 mg by mouth every 6 (six) hours as needed for moderate pain.   allopurinol (ZYLOPRIM) 100 MG tablet Take 1 tablet by mouth once daily   ALPRAZolam (XANAX) 0.5 MG tablet TAKE 1 TABLET BY MOUTH ONCE DAILY AS  NEEDED  FOR  ANXIETY  TAKE  30  MINUTES  PRIOR  TO  PLANE FLIGHT OR AS NEEDED FOR ANXIETY   aspirin EC 81 MG tablet Take 1 tablet (81 mg total) by mouth daily. Swallow whole.   atorvastatin (LIPITOR) 80 MG tablet Take 1 tablet by mouth once daily   Biotin 10 MG CAPS Take by mouth.   colchicine 0.6 MG tablet Take 2 tablets by mouth once daily   furosemide (LASIX) 20 MG tablet TAKE 2 TABLETS BY MOUTH ONCE DAILY IN THE MORNING AND 1 TAB ONCE DAILY IN THE AFTERNOON   ketoconazole (NIZORAL) 2 % shampoo Apply 1 application topically 2 (two) times a week.   KLOR-CON M20 20 MEQ tablet Take 1 tablet by mouth once daily   latanoprost (XALATAN) 0.005 % ophthalmic solution Place 1 drop into both eyes every morning.    metoprolol succinate (TOPROL-XL) 25 MG 24 hr tablet Take 1 tablet (25 mg total) by mouth 2 (two) times daily.   Misc Natural Products (OSTEO BI-FLEX/5-LOXIN ADVANCED) TABS Take 2 tablets by mouth  daily.   sacubitril-valsartan (ENTRESTO) 97-103 MG Take 1 tablet by mouth 2 (two) times daily.   sertraline (ZOLOFT) 50 MG tablet Take 1 tablet (50 mg total) by mouth daily.   spironolactone (ALDACTONE) 25 MG tablet Take 1 tablet by mouth once daily   traMADol (ULTRAM) 50 MG tablet TAKE 1 TABLET BY MOUTH EVERY 4 HOURS AS NEEDED FOR PAIN   traZODone (DESYREL) 50 MG tablet Take 0.5-1 tablets (25-50 mg total) by mouth at bedtime as needed for sleep.   No facility-administered encounter medications on file as of 11/07/2021.    Recent Office Vitals: BP Readings from Last 3 Encounters:  11/03/21 120/60  10/10/21 (!) 140/54  09/12/21 (!) 151/78   Pulse Readings from Last 3 Encounters:  11/03/21 68  10/10/21 65  09/12/21 68    Wt Readings from Last 3 Encounters:  11/03/21 235 lb 12.8 oz (107 kg)  09/12/21 239 lb 4.8 oz (108.5 kg)  06/20/21 227 lb 1.6 oz (103 kg)     Kidney Function Lab Results  Component Value Date/Time   CREATININE 0.98 09/12/2021 09:18 AM   CREATININE 0.85 06/20/2021 08:12 AM   GFR 85.22 05/01/2021 08:07 AM   GFRNONAA >60 09/12/2021 09:18 AM   GFRAA >60 03/17/2019 01:04 AM       Latest Ref Rng & Units 09/12/2021    9:18 AM 06/20/2021    8:12 AM 05/01/2021    8:07 AM  BMP  Glucose 70 - 99  mg/dL 117  114  88   BUN 8 - 23 mg/dL '20  20  25   '$ Creatinine 0.61 - 1.24 mg/dL 0.98  0.85  0.90   Sodium 135 - 145 mmol/L 139  142  140   Potassium 3.5 - 5.1 mmol/L 4.6  4.1  4.7   Chloride 98 - 111 mmol/L 104  108  105   CO2 22 - 32 mmol/L '30  29  28   '$ Calcium 8.9 - 10.3 mg/dL 9.1  8.9  8.8     Attempted contact with patient 3 times. Unsuccessful outreach. Will atttempt contact next month.  Current antihypertensive regimen:  Entresto 97-103 mg BID Metoprolol succinate 25 mg - 1/2 tablet daily Spironolactone 25 mg daily  Potassium chloride SA 20 mEq daily  Furosemide 20 mg - 2 tab AM, 1 tab PM   Adherence Review: Is the patient currently on ACE/ARB medication?  Yes Does the patient have >5 day gap between last estimated fill dates? Yes Sacubitril-Valsartan 97-103 mg  Star Rating Drugs:  Medication:    Last Fill: Day Supply Atorvastatin 80 mg   10/10/21 90 Sacubitril-Valsartan 97-103 mg 07/13/21 30 Verified with [facility]   Summary of recommendations from last [place] visit (Date:08/11/2021)  Summary: CCM f/u visit -Pt reports ongoing breast pain, not improved by stopping potassium, vitamins. Reviewed gynecomastia and breast pain are known side effects of spironolactone -Pt requests refill for tramadol   Recommendations/Changes made from today's visit: -Advised pt to follow up with cardiology regarding spironolactone/breast pain; can consider dose reduction or switch to epleronone -Coordinate with PCP for tramadol refill   Follow up: -[place] will call patient 3 months for HF update -Pharmacist follow up televisit scheduled for 6 months -Nurse AWV is scheduled for 12/2021  Care Gaps: Annual wellness visit in last year? Yes 04/27/2021 Most Recent BP reading: 120/60 on 11/03/2021  If Diabetic: Most recent A1C reading: Last eye exam / retinopathy screening: Last diabetic foot exam:  Upcoming appointments: Cardiology appointment on 11/15/2021 Echocardiogram appointment on 12/01/2021 AWV appointment on 12/22/2021 CCM appointment on 02/15/2022  [name], CPP notified  Marijean Niemann, [place] Assistant [phone]

## 2021-11-07 NOTE — Patient Instructions (Signed)
Vitamin B12 Deficiency Vitamin B12 deficiency occurs when the body does not have enough of this important vitamin. The body needs this vitamin: To make red blood cells. To make DNA. This is the genetic material inside cells. To help the nerves work properly so they can carry messages from the brain to the body. Vitamin B12 deficiency can cause health problems, such as not having enough red blood cells in the blood (anemia). This can lead to nerve damage if untreated. What are the causes? This condition may be caused by: Not eating enough foods that contain vitamin B12. Not having enough stomach acid and digestive fluids to properly absorb vitamin B12 from the food that you eat. Having certain diseases that make it hard to absorb vitamin B12. These diseases include Crohn's disease, chronic pancreatitis, and cystic fibrosis. An autoimmune disorder in which the body does not make enough of a protein (intrinsic factor) within the stomach, resulting in not enough absorption of vitamin B12. Having a surgery in which part of the stomach or small intestine is removed. Taking certain medicines that make it hard for the body to absorb vitamin B12. These include: Heartburn medicines, such as antacids and proton pump inhibitors. Some medicines that are used to treat diabetes. What increases the risk? The following factors may make you more likely to develop a vitamin B12 deficiency: Being an older adult. Eating a vegetarian or vegan diet that does not include any foods that come from animals. Eating a poor diet while you are pregnant. Taking certain medicines. Having alcoholism. What are the signs or symptoms? In some cases, there are no symptoms of this condition. If the condition leads to anemia or nerve damage, various symptoms may occur, such as: Weakness. Tiredness (fatigue). Loss of appetite. Numbness or tingling in your hands and feet. Redness and burning of the tongue. Depression,  confusion, or memory problems. Trouble walking. If anemia is severe, symptoms can include: Shortness of breath. Dizziness. Rapid heart rate. How is this diagnosed? This condition may be diagnosed with a blood test to measure the level of vitamin B12 in your blood. You may also have other tests, including: A group of tests that measure certain characteristics of blood cells (complete blood count, CBC). A blood test to measure intrinsic factor. A procedure where a thin tube with a camera on the end is used to look into your stomach or intestines (endoscopy). Other tests may be needed to discover the cause of the deficiency. How is this treated? Treatment for this condition depends on the cause. This condition may be treated by: Changing your eating and drinking habits, such as: Eating more foods that contain vitamin B12. Drinking less alcohol or no alcohol. Getting vitamin B12 injections. Taking vitamin B12 supplements by mouth (orally). Your health care provider will tell you which dose is best for you. Follow these instructions at home: Eating and drinking  Include foods in your diet that come from animals and contain a lot of vitamin B12. These include: Meats and poultry. This includes beef, pork, chicken, turkey, and organ meats, such as liver. Seafood. This includes clams, rainbow trout, salmon, tuna, and haddock. Eggs. Dairy foods such as milk, yogurt, and cheese. Eat foods that have vitamin B12 added to them (are fortified), such as ready-to-eat breakfast cereals. Check the label on the package to see if a food is fortified. The items listed above may not be a complete list of foods and beverages you can eat and drink. Contact a dietitian for   more information. Alcohol use Do not drink alcohol if: Your health care provider tells you not to drink. You are pregnant, may be pregnant, or are planning to become pregnant. If you drink alcohol: Limit how much you have to: 0-1 drink a  day for women. 0-2 drinks a day for men. Know how much alcohol is in your drink. In the [place] one drink equals one 12 oz bottle of beer (355 mL), one 5 oz glass of wine (148 mL), or one 1 oz glass of hard liquor (44 mL). General instructions Get vitamin B12 injections if told to by your health care provider. Take supplements only as told by your health care provider. Follow the directions carefully. Keep all follow-up visits. This is important. Contact a health care provider if: Your symptoms come back. Your symptoms get worse or do not improve with treatment. Get help right away: You develop shortness of breath. You have a rapid heart rate. You have chest pain. You become dizzy or you faint. These symptoms may be an emergency. Get help right away. Call 911. Do not wait to see if the symptoms will go away. Do not drive yourself to the hospital. Summary Vitamin B12 deficiency occurs when the body does not have enough of this important vitamin. Common causes include not eating enough foods that contain vitamin B12, not being able to absorb vitamin B12 from the food that you eat, having a surgery in which part of the stomach or small intestine is removed, or taking certain medicines. Eat foods that have vitamin B12 in them. Treatment may include making a change in the way you eat and drink, getting vitamin B12 injections, or taking vitamin B12 supplements. This information is not intended to replace advice given to you by your health care provider. Make sure you discuss any questions you have with your health care provider. Document Revised: 08/12/2020 Document Reviewed: 08/12/2020 Elsevier Patient Education  2023 [employer]  

## 2021-11-15 ENCOUNTER — Ambulatory Visit: Payer: Medicare HMO | Attending: Cardiology | Admitting: Cardiology

## 2021-11-15 ENCOUNTER — Encounter: Payer: Self-pay | Admitting: Cardiology

## 2021-11-15 VITALS — BP 116/64 | HR 70 | Ht 66.5 in | Wt 236.0 lb

## 2021-11-15 DIAGNOSIS — Z95810 Presence of automatic (implantable) cardiac defibrillator: Secondary | ICD-10-CM

## 2021-11-15 DIAGNOSIS — I2510 Atherosclerotic heart disease of native coronary artery without angina pectoris: Secondary | ICD-10-CM | POA: Diagnosis not present

## 2021-11-15 DIAGNOSIS — I5022 Chronic systolic (congestive) heart failure: Secondary | ICD-10-CM | POA: Diagnosis not present

## 2021-11-15 LAB — CUP PACEART INCLINIC DEVICE CHECK
Battery Remaining Longevity: 69 mo
Brady Statistic RA Percent Paced: 70 %
Brady Statistic RV Percent Paced: 99.25 %
Date Time Interrogation Session: 20231115141121
HighPow Impedance: 67.5 Ohm
Implantable Lead Connection Status: 753985
Implantable Lead Connection Status: 753985
Implantable Lead Connection Status: 753985
Implantable Lead Implant Date: 20221020
Implantable Lead Implant Date: 20221020
Implantable Lead Implant Date: 20221020
Implantable Lead Location: 753858
Implantable Lead Location: 753859
Implantable Lead Location: 753860
Implantable Pulse Generator Implant Date: 20221020
Lead Channel Impedance Value: 387.5 Ohm
Lead Channel Impedance Value: 412.5 Ohm
Lead Channel Impedance Value: 725 Ohm
Lead Channel Pacing Threshold Amplitude: 0.75 V
Lead Channel Pacing Threshold Amplitude: 0.75 V
Lead Channel Pacing Threshold Amplitude: 0.75 V
Lead Channel Pacing Threshold Amplitude: 0.75 V
Lead Channel Pacing Threshold Amplitude: 1.75 V
Lead Channel Pacing Threshold Amplitude: 1.75 V
Lead Channel Pacing Threshold Pulse Width: 0.5 ms
Lead Channel Pacing Threshold Pulse Width: 0.5 ms
Lead Channel Pacing Threshold Pulse Width: 0.5 ms
Lead Channel Pacing Threshold Pulse Width: 0.5 ms
Lead Channel Pacing Threshold Pulse Width: 0.7 ms
Lead Channel Pacing Threshold Pulse Width: 0.7 ms
Lead Channel Sensing Intrinsic Amplitude: 12 mV
Lead Channel Sensing Intrinsic Amplitude: 2.3 mV
Lead Channel Setting Pacing Amplitude: 1.625
Lead Channel Setting Pacing Amplitude: 2.5 V
Lead Channel Setting Pacing Amplitude: 2.5 V
Lead Channel Setting Pacing Pulse Width: 0.5 ms
Lead Channel Setting Pacing Pulse Width: 0.7 ms
Lead Channel Setting Sensing Sensitivity: 0.5 mV
Pulse Gen Serial Number: 111052020
Zone Setting Status: 755011

## 2021-11-15 NOTE — Patient Instructions (Signed)
Medication Instructions:  None *If you need a refill on your cardiac medications before your next appointment, please call your pharmacy*   Lab Work: none If you have labs (blood work) drawn today and your tests are completely normal, you will receive your results only by: [place] (if you have MyChart) OR A paper copy in the mail If you have any lab test that is abnormal or we need to change your treatment, we will call you to review the results.   Testing/Procedures: none   Follow-Up: At [facility], you and your health needs are our priority.  As part of our continuing mission to provide you with exceptional heart care, we have created designated Provider Care Teams.  These Care Teams include your primary Cardiologist (physician) and Advanced Practice Providers (APPs -  Physician Assistants and Nurse Practitioners) who all work together to provide you with the care you need, when you need it.  We recommend signing up for the patient portal called "MyChart".  Sign up information is provided on this After Visit Summary.  MyChart is used to connect with patients for Virtual Visits (Telemedicine).  Patients are able to view lab/test results, encounter notes, upcoming appointments, etc.  Non-urgent messages can be sent to your provider as well.   To learn more about what you can do with MyChart, go to [email or web address].    Your next appointment:   1 year(s)  The format for your next appointment:   In Person  Provider:   You will see one of the following Advanced Practice Providers on your designated Care Team:   Murray Hodgkins, NP Christell Faith, PA-C Cadence Kathlen Mody, PA-C Gerrie Nordmann, NP      Other Instructions None   Important Information About Sugar

## 2021-11-15 NOTE — Progress Notes (Signed)
Electrophysiology Office Follow up Visit Note:    Date:  11/15/2021   ID:  Gregory [name], DOB January 22, 1950, MRN [id number]  PCP:  Jinny Sanders, MD  [facility] Cardiologist:  [name], MD  [facility] Electrophysiologist:  Vickie Epley, MD    Interval History:    Gregory [name] is a 71 y.o. male who presents for a follow up visit. They were last seen in clinic January 25, 2021.  He has a BiV ICD that was implanted October 20, 2020.  At that appointment he was doing well at his implant with NYHA class II symptoms.  He had a normalized ejection fraction.  He tells me he is doing well today.  Where he is working currently he has declined a [place] crew of stairs at least 15 times per day and is able to do that.  No syncope or presyncope today.  No high-voltage therapies.  Device incision is healed well.     Past Medical History:  Diagnosis Date   Alcohol abuse    Anxiety    Aortic valve disease    Arthritis    Coronary artery disease    Glaucoma    Hypertension    OSA (obstructive sleep apnea)    Pre-diabetes    S/P balloon aortic valvuloplasty 10/18/2020   done for bioprosthetic aortic valve replacement perivalvular leaking reducing it from severe to mild.   Shingles     Past Surgical History:  Procedure Laterality Date   AORTIC ARCH ANGIOGRAPHY N/A 10/10/2020   Procedure: AORTIC ARCH ANGIOGRAPHY;  Surgeon: Nelva Bush, MD;  Location: [place] [facility];  Service: Cardiovascular;  Laterality: N/A;   AORTIC VALVE REPLACEMENT N/A 03/06/2019   Procedure: AORTIC VALVE REPLACEMENT (AVR), USING INTUITY 25MM;  Surgeon: Wonda Olds, MD;  Location: [place];  Service: Open Heart Surgery;  Laterality: N/A;   BALLOON AORTIC VALVE VALVULOPLASTY N/A 10/18/2020   Procedure: BALLOON AORTIC VALVE VALVULOPLASTY;  Surgeon: Sherren Mocha, MD;  Location: [place];  Service: Cardiovascular;  Laterality: N/A;   BIV PACEMAKER INSERTION CRT-P N/A 10/20/2020   Procedure:  BIV PACEMAKER INSERTION CRT-P;  Surgeon: Vickie Epley, MD;  Location: [place] [facility];  Service: Cardiovascular;  Laterality: N/A;   CARDIAC CATHETERIZATION     CATARACT EXTRACTION     CORONARY ARTERY BYPASS GRAFT N/A 03/06/2019   Procedure: CORONARY ARTERY BYPASS GRAFTING (CABG), ON PUMP, TIMES THREE, USING BILATERAL INTERNAL MAMMARIES AND LEFT RADIAL ARTERY HARVEST;  Surgeon: Wonda Olds, MD;  Location: [place];  Service: Open Heart Surgery;  Laterality: N/A;  BILATERAL IMA   LEFT HEART CATH AND CORONARY ANGIOGRAPHY N/A 03/04/2019   Procedure: LEFT HEART CATH AND CORONARY ANGIOGRAPHY;  Surgeon: Burnell Blanks, MD;  Location: [place] [facility];  Service: Cardiovascular;  Laterality: N/A;   None     RADIAL ARTERY HARVEST Left 03/06/2019   Procedure: RADIAL ARTERY HARVEST;  Surgeon: Wonda Olds, MD;  Location: [place];  Service: Open Heart Surgery;  Laterality: Left;   RIGHT HEART CATH AND CORONARY/GRAFT ANGIOGRAPHY N/A 10/10/2020   Procedure: RIGHT HEART CATH AND CORONARY/GRAFT ANGIOGRAPHY;  Surgeon: Nelva Bush, MD;  Location: [place] [facility];  Service: Cardiovascular;  Laterality: N/A;   TEE WITHOUT CARDIOVERSION N/A 03/06/2019   Procedure: TRANSESOPHAGEAL ECHOCARDIOGRAM (TEE);  Surgeon: Wonda Olds, MD;  Location: [place];  Service: Open Heart Surgery;  Laterality: N/A;   TEE WITHOUT CARDIOVERSION N/A 10/10/2020   Procedure: TRANSESOPHAGEAL ECHOCARDIOGRAM (TEE);  Surgeon: Josue Hector, MD;  Location: [facility];  Service: Cardiovascular;  Laterality: N/A;   TEE WITHOUT CARDIOVERSION N/A 10/18/2020   Procedure: TRANSESOPHAGEAL ECHOCARDIOGRAM (TEE);  Surgeon: Sherren Mocha, MD;  Location: [place];  Service: Open Heart Surgery;  Laterality: N/A;   TEMPORARY PACEMAKER N/A 10/17/2020   Procedure: TEMPORARY PACEMAKER;  Surgeon: Evans Lance, MD;  Location: [place] [facility];  Service: Cardiovascular;  Laterality: N/A;    Current Medications: Current Meds   Medication Sig   acetaminophen (TYLENOL) 500 MG tablet Take 1,000 mg by mouth every 6 (six) hours as needed for moderate pain.   allopurinol (ZYLOPRIM) 100 MG tablet Take 1 tablet by mouth once daily   ALPRAZolam (XANAX) 0.5 MG tablet TAKE 1 TABLET BY MOUTH ONCE DAILY AS  NEEDED  FOR  ANXIETY  TAKE  30  MINUTES  PRIOR  TO  PLANE FLIGHT OR AS NEEDED FOR ANXIETY   aspirin EC 81 MG tablet Take 1 tablet (81 mg total) by mouth daily. Swallow whole.   atorvastatin (LIPITOR) 80 MG tablet Take 1 tablet by mouth once daily   Biotin 10 MG CAPS Take by mouth.   colchicine 0.6 MG tablet Take 2 tablets by mouth once daily   furosemide (LASIX) 20 MG tablet TAKE 2 TABLETS BY MOUTH ONCE DAILY IN THE MORNING AND 1 TAB ONCE DAILY IN THE AFTERNOON   ketoconazole (NIZORAL) 2 % shampoo Apply 1 application topically 2 (two) times a week.   KLOR-CON M20 20 MEQ tablet Take 1 tablet by mouth once daily   latanoprost (XALATAN) 0.005 % ophthalmic solution Place 1 drop into both eyes every morning.    metoprolol succinate (TOPROL-XL) 25 MG 24 hr tablet Take 1 tablet (25 mg total) by mouth 2 (two) times daily.   Misc Natural Products (OSTEO BI-FLEX/5-LOXIN ADVANCED) TABS Take 2 tablets by mouth daily.   sacubitril-valsartan (ENTRESTO) 97-103 MG Take 1 tablet by mouth 2 (two) times daily.   sertraline (ZOLOFT) 50 MG tablet Take 1 tablet (50 mg total) by mouth daily.   spironolactone (ALDACTONE) 25 MG tablet Take 1 tablet by mouth once daily   traMADol (ULTRAM) 50 MG tablet TAKE 1 TABLET BY MOUTH EVERY 4 HOURS AS NEEDED FOR PAIN   traZODone (DESYREL) 50 MG tablet Take 0.5-1 tablets (25-50 mg total) by mouth at bedtime as needed for sleep.     Allergies:   Heparin   Social History   Socioeconomic History   Marital status: Married    Spouse name: Cindi   Number of children: 3   Years of education: Not on file   Highest education level: Not on file  Occupational History   Occupation: Architect  Tobacco Use    Smoking status: Never   Smokeless tobacco: Never  Vaping Use   Vaping Use: Never used  Substance and Sexual Activity   Alcohol use: Yes    Alcohol/week: 12.0 standard drinks of alcohol    Types: 12 Cans of beer per week   Drug use: Yes    Frequency: 2.0 times per week    Types: Marijuana    Comment: gummies   Sexual activity: Not on file  Other Topics Concern   Not on file  Social History Narrative   Lives at home with wife.   Social Determinants of Health   Financial Resource Strain: Low Risk  (12/20/2020)   Overall Financial Resource Strain (CARDIA)    Difficulty of Paying Living Expenses: Not very hard  Recent  Concern: Financial Resource Strain - Medium Risk (11/01/2020)   Overall Financial Resource Strain (CARDIA)    Difficulty of Paying Living Expenses: Somewhat hard  Food Insecurity: No Food Insecurity (12/20/2020)   Hunger Vital Sign    Worried About Running Out of Food in the Last Year: Never true    [place] in the Last Year: Never true  Transportation Needs: No Transportation Needs (12/20/2020)   PRAPARE - Hydrologist (Medical): No    Lack of Transportation (Non-Medical): No  Physical Activity: Sufficiently Active (12/20/2020)   Exercise Vital Sign    Days of Exercise per Week: 5 days    Minutes of Exercise per Session: 150+ min  Stress: No Stress Concern Present (12/20/2020)   [place]    Feeling of Stress : Not at all  Social Connections: Moderately Isolated (12/20/2020)   Social Connection and Isolation Panel [NHANES]    Frequency of Communication with Friends and Family: Once a week    Frequency of Social Gatherings with Friends and Family: Three times a week    Attends Religious Services: Never    Active Member of Clubs or Organizations: No    Attends Archivist Meetings: Never    Marital Status: Married     Family History: The patient's  family history includes Alcohol abuse in his father; Breast cancer in his sister; Cancer in his mother. He was adopted.  ROS:   Please see the history of present illness.    All other systems reviewed and are negative.  EKGs/Labs/Other Studies Reviewed:    The following studies were reviewed today:  November 15, 2021 in clinic device interrogation personally reviewed Longevity 5.8 years Lead parameters stable Programmed DDD but did reprogram him to DDDR given a relatively flat histogram.  He biventricular paces greater than 90%  EKG:  The ekg ordered today demonstrates AV pacing. Biv pacing.  Recent Labs: 05/01/2021: TSH 2.90 09/12/2021: ALT 22; BUN 20; Creatinine 0.98; Hemoglobin 11.1; Platelet Count 190; Potassium 4.6; Sodium 139  Recent Lipid Panel    Component Value Date/Time   CHOL 150 04/24/2021 0836   CHOL 132 08/07/2019 0811   TRIG 94.0 04/24/2021 0836   TRIG 162 (H) 10/25/2005 1507   HDL 77.90 04/24/2021 0836   HDL 61 08/07/2019 0811   CHOLHDL 2 04/24/2021 0836   VLDL 18.8 04/24/2021 0836   LDLCALC 53 04/24/2021 0836   LDLCALC 52 08/07/2019 0811   LDLDIRECT 151.0 01/05/2016 0748    Physical Exam:    VS:  BP 116/64   Pulse 70   Ht 5' 6.5" (1.689 m)   Wt 236 lb (107 kg)   SpO2 97%   BMI 37.52 kg/m     Wt Readings from Last 3 Encounters:  11/15/21 236 lb (107 kg)  11/03/21 235 lb 12.8 oz (107 kg)  09/12/21 239 lb 4.8 oz (108.5 kg)     GEN:  Well nourished, well developed in no acute distress HEENT: Normal NECK: No JVD; No carotid bruits LYMPHATICS: No lymphadenopathy CARDIAC: RRR, no murmurs, rubs, gallops.  Device pocket well-healed. RESPIRATORY:  Clear to auscultation without rales, wheezing or rhonchi  ABDOMEN: Soft, non-tender, non-distended MUSCULOSKELETAL:  No edema; No deformity  SKIN: Warm and dry NEUROLOGIC:  Alert and oriented x 3 PSYCHIATRIC:  Normal affect        ASSESSMENT:    1. Chronic systolic HF (heart failure) ([place])   2.  Cardiac resynchronization therapy defibrillator (CRT-D) in place   3. Coronary artery disease involving native coronary artery of native heart without angina pectoris    PLAN:    In order of problems listed above:  #Chronic systolic heart failure NYHA class II today.Warm and dry on exam.  Continue current medical therapy.  #CRT-D in situ Device functioning appropriately.  Continue remote monitoring.  #Coronary artery disease No ischemic symptoms today.  Continue current therapy.  Follow-up 1 year with APP.   Medication Adjustments/Labs and Tests Ordered: Current medicines are reviewed at length with the patient today.  Concerns regarding medicines are outlined above.  Orders Placed This Encounter  Procedures   EKG 12-Lead   No orders of the defined types were placed in this encounter.    Signed, Lars Mage, MD, [facility], [facility] 11/15/2021 8:56 AM    Electrophysiology [facility]

## 2021-11-16 ENCOUNTER — Encounter: Payer: Self-pay | Admitting: Family Medicine

## 2021-11-16 ENCOUNTER — Encounter: Payer: Self-pay | Admitting: Cardiology

## 2021-11-17 ENCOUNTER — Other Ambulatory Visit (HOSPITAL_COMMUNITY): Payer: Medicare HMO

## 2021-11-17 ENCOUNTER — Ambulatory Visit: Payer: Medicare HMO

## 2021-11-24 ENCOUNTER — Other Ambulatory Visit: Payer: Self-pay | Admitting: Family Medicine

## 2021-11-29 ENCOUNTER — Encounter: Payer: Self-pay | Admitting: Family Medicine

## 2021-11-29 ENCOUNTER — Ambulatory Visit (INDEPENDENT_AMBULATORY_CARE_PROVIDER_SITE_OTHER): Payer: Medicare HMO | Admitting: Family Medicine

## 2021-11-29 VITALS — BP 118/58 | HR 83 | Temp 96.2°F | Ht 66.5 in | Wt 238.0 lb

## 2021-11-29 DIAGNOSIS — R7303 Prediabetes: Secondary | ICD-10-CM | POA: Diagnosis not present

## 2021-11-29 DIAGNOSIS — Z862 Personal history of diseases of the blood and blood-forming organs and certain disorders involving the immune mechanism: Secondary | ICD-10-CM | POA: Diagnosis not present

## 2021-11-29 DIAGNOSIS — D509 Iron deficiency anemia, unspecified: Secondary | ICD-10-CM | POA: Diagnosis not present

## 2021-11-29 DIAGNOSIS — Z01818 Encounter for other preprocedural examination: Secondary | ICD-10-CM | POA: Diagnosis not present

## 2021-11-29 NOTE — Assessment & Plan Note (Addendum)
No heparin.  Consider having blood  and blood products available.

## 2021-11-29 NOTE — Assessment & Plan Note (Signed)
Resolved at recheck.

## 2021-11-29 NOTE — Assessment & Plan Note (Signed)
Followed by Dr. Berneta Sages.  Last hemoglobin within nml range.

## 2021-11-29 NOTE — Progress Notes (Signed)
Patient ID: Gregory [name], male    DOB: 09-09-1950, 71 y.o.   MRN: [id number]  This visit was conducted in person.  BP (!) 118/58 (BP Location: Right Arm, Patient Position: Sitting, Cuff Size: Normal)   Pulse 83   Temp (!) 96.2 F (35.7 C) (Temporal)   Ht 5' 6.5" (1.689 m)   Wt 238 lb (108 kg)   SpO2 96%   BMI 37.84 kg/m    CC:  Chief Complaint  Patient presents with   Pre-op Exam    For knee surgery; already had EKG done with Cardiology on 11/15/21.    Subjective:   HPI: Gregory [name] is a 71 y.o. male presenting on 11/29/2021 for Pre-op Exam (For knee surgery; already had EKG done with Cardiology on 11/15/21.)  Reviewed recent cardiology office visit note Dr. Percival Spanish from November 03, 2021 History of chronic systolic heart failure ( EF 50-55%), CAD status post CABG,AVR s/p aortic valve replacement atherosclerosis of the aorta, complete AV block, atrial fibrillation and dilated cardiomyopathy. He underwent coronary artery bypass graft with aortic valve replacement on 03/06/2019.  Reviewed recent electrophysiology cardiology office visit note from November 15, 2021 He has a BiV ICD that was implanted October 20, 2020     History of severe reaction to heparin with HIT and profound thrombocytopenia postop. to  He is high risk cardio vascularly.. needs cardiology risk stratification prior to upcoming surgery.  Patient has requested clearance from cardiology which is pending.  He had previous clearance  01/2021 ( reviewed my note)prior to expected knee surgery but the knee replacement was not done.  Today he reports no acute infection, no change in any cardiopulmonary symptoms.  No SOB, NO CP, stable mild  edema ( some more after work)  Working daily, going up and =down stairs moderately well.. knee limiting.  HTN, well controlled  BP Readings from Last 3 Encounters:  11/29/21 (!) 118/58  11/15/21 116/64  11/03/21 120/60    Prediabetes  Lab Results  Component  Value Date   HGBA1C 4.6 04/24/2021   Hx of iron deficiency anemia , followed by Dr. Lindi Adie.  Most recent cbc ( hg 11.1 and platelets 190) and  slightly low iron  Relevant past medical, surgical, family and social history reviewed and updated as indicated. Interim medical history since our last visit reviewed. Allergies and medications reviewed and updated. Outpatient Medications Prior to Visit  Medication Sig Dispense Refill   acetaminophen (TYLENOL) 500 MG tablet Take 1,000 mg by mouth every 6 (six) hours as needed for moderate pain.     allopurinol (ZYLOPRIM) 100 MG tablet Take 1 tablet by mouth once daily 90 tablet 1   ALPRAZolam (XANAX) 0.5 MG tablet TAKE 1 TABLET BY MOUTH ONCE DAILY AS  NEEDED  FOR  ANXIETY  TAKE  30  MINUTES  PRIOR  TO  PLANE FLIGHT OR AS NEEDED FOR ANXIETY 20 tablet 0   aspirin EC 81 MG tablet Take 1 tablet (81 mg total) by mouth daily. Swallow whole. 30 tablet 11   atorvastatin (LIPITOR) 80 MG tablet Take 1 tablet by mouth once daily 90 tablet 3   Biotin 10 MG CAPS Take by mouth.     colchicine 0.6 MG tablet Take 2 tablets by mouth once daily 60 tablet 2   furosemide (LASIX) 20 MG tablet TAKE 2 TABLETS BY MOUTH ONCE DAILY IN THE MORNING AND 1 TAB ONCE DAILY IN THE AFTERNOON 180 tablet 2   ketoconazole (NIZORAL) 2 %  shampoo Apply 1 application topically 2 (two) times a week. 120 mL 1   KLOR-CON M20 20 MEQ tablet Take 1 tablet by mouth once daily 90 tablet 0   latanoprost (XALATAN) 0.005 % ophthalmic solution Place 1 drop into both eyes every morning.      metoprolol succinate (TOPROL-XL) 25 MG 24 hr tablet Take 1 tablet (25 mg total) by mouth 2 (two) times daily. 180 tablet 3   Misc Natural Products (OSTEO BI-FLEX/5-LOXIN ADVANCED) TABS Take 2 tablets by mouth daily.     sacubitril-valsartan (ENTRESTO) 97-103 MG Take 1 tablet by mouth 2 (two) times daily. 60 tablet 11   sertraline (ZOLOFT) 50 MG tablet Take 1 tablet (50 mg total) by mouth daily. 90 tablet 1    spironolactone (ALDACTONE) 25 MG tablet Take 1 tablet by mouth once daily 90 tablet 1   traMADol (ULTRAM) 50 MG tablet TAKE 1 TABLET BY MOUTH EVERY 4 HOURS AS NEEDED FOR PAIN 30 tablet 0   traZODone (DESYREL) 50 MG tablet Take 0.5-1 tablets (25-50 mg total) by mouth at bedtime as needed for sleep. 90 tablet 1   No facility-administered medications prior to visit.     Per HPI unless specifically indicated in ROS section below Review of Systems  Constitutional:  Negative for fatigue and fever.  HENT:  Negative for ear pain.   Eyes:  Negative for pain.  Respiratory:  Negative for cough and shortness of breath.   Cardiovascular:  Negative for chest pain, palpitations and leg swelling.  Gastrointestinal:  Negative for abdominal pain.  Genitourinary:  Negative for dysuria.  Musculoskeletal:  Negative for arthralgias.  Neurological:  Negative for syncope, light-headedness and headaches.  Psychiatric/Behavioral:  Negative for dysphoric mood.    Objective:  BP (!) 118/58 (BP Location: Right Arm, Patient Position: Sitting, Cuff Size: Normal)   Pulse 83   Temp (!) 96.2 F (35.7 C) (Temporal)   Ht 5' 6.5" (1.689 m)   Wt 238 lb (108 kg)   SpO2 96%   BMI 37.84 kg/m   Wt Readings from Last 3 Encounters:  11/29/21 238 lb (108 kg)  11/15/21 236 lb (107 kg)  11/03/21 235 lb 12.8 oz (107 kg)      Physical Exam Constitutional:      Appearance: He is well-developed. He is obese.  HENT:     Head: Normocephalic.     Right Ear: Hearing normal.     Left Ear: Hearing normal.     Nose: Nose normal.  Neck:     Thyroid: No thyroid mass or thyromegaly.     Vascular: No carotid bruit.     Trachea: Trachea normal.     Comments: Small oropharynx and  wide neck Cardiovascular:     Rate and Rhythm: Normal rate and regular rhythm.     Pulses: Normal pulses.     Heart sounds: Heart sounds not distant. No murmur heard.    No friction rub. No gallop.     Comments: No peripheral edema Pulmonary:      Effort: Pulmonary effort is normal. No respiratory distress.     Breath sounds: Normal breath sounds.  Skin:    General: Skin is warm and dry.     Findings: No rash.  Psychiatric:        Speech: Speech normal.        Behavior: Behavior normal.        Thought Content: Thought content normal.       Results for orders placed  or performed in visit on 11/15/21  [facility]  Result Value Ref Range   Date Time Interrogation Session [id number]    Pulse Generator Manufacturer [place] [facility]    Pulse Gen Serial Number [id number]    Clinic Name [facility]    Implantable Pulse Generator Type Cardiac Resynch Therapy Defibulator    Implantable Pulse Generator Implant Date [id number]    Implantable Lead Manufacturer [facility]    Implantable Lead Model 1458Q Quartet    Implantable Lead Serial Number [id number]    Implantable Lead Implant Date 00923300    Implantable Lead Location Detail 1 UNKNOWN    Implantable Lead Location [id number]    Implantable Lead Connection Status [id number]    Implantable Lead Manufacturer [facility]    Implantable Lead Model [id number] Durata SJ4    Implantable Lead Serial Number [id number]    Implantable Lead Implant Date [id number]    Implantable Lead Location Detail 1 UNKNOWN    Implantable Lead Location [id number]    Implantable Lead Connection Status [id number]    Implantable Lead Manufacturer [employer]    Implantable Lead Model LPA1200M Tendril MRI    Implantable Lead Serial Number [id number]    Implantable Lead Implant Date [id number]    Implantable Lead Location Detail 1 UNKNOWN    Implantable Lead Location [id number]    Implantable Lead Connection Status [id number]    Lead Channel Setting Sensing Sensitivity 0.5 mV   Lead Channel Setting Pacing Amplitude 2.5 V   Lead Channel Setting Pacing Pulse Width 0.5 ms   Lead Channel Setting Pacing Amplitude 1.625    Lead Channel Setting Pacing Pulse Width 0.7 ms   Lead Channel Setting Pacing  Amplitude 2.5 V   Lead Channel Setting Pacing Capture Mode Adaptive Capture    Zone Setting Status Active    Zone Setting Status Inactive    Zone Setting Status [phone]    Lead Channel Impedance Value 412.5 ohm   Lead Channel Sensing Intrinsic Amplitude 2.3 mV   Lead Channel Pacing Threshold Amplitude 0.75 V   Lead Channel Pacing Threshold Pulse Width 0.5 ms   Lead Channel Pacing Threshold Amplitude 0.75 V   Lead Channel Pacing Threshold Pulse Width 0.5 ms   Lead Channel Impedance Value 387.5 ohm   Lead Channel Sensing Intrinsic Amplitude 12.0 mV   Lead Channel Pacing Threshold Amplitude 0.75 V   Lead Channel Pacing Threshold Pulse Width 0.5 ms   Lead Channel Pacing Threshold Amplitude 0.75 V   Lead Channel Pacing Threshold Pulse Width 0.5 ms   HighPow Impedance 67.5 ohm   Lead Channel Impedance Value 725.0 ohm   Lead Channel Pacing Threshold Amplitude 1.75 V   Lead Channel Pacing Threshold Pulse Width 0.7 ms   Lead Channel Pacing Threshold Amplitude 1.75 V   Lead Channel Pacing Threshold Pulse Width 0.7 ms   Battery Remaining Longevity 69 mo   Brady Statistic RA Percent Paced 70.0 %   Brady Statistic RV Percent Paced 99.25 %   Eval Rhythm VP'@30'$       COVID 19 screen:  No recent travel or known exposure to COVID19 The patient denies respiratory symptoms of COVID 19 at this time. The importance of social distancing was discussed today.   Assessment and Plan Problem List Items Addressed This Visit     Prediabetes (Chronic)    Resolved at recheck.      History of heparin-induced thrombocytopenia    No heparin.  Consider having  blood  and blood products available.      Iron deficiency anemia    Followed by Dr. Berneta Sages.  Last hemoglobin within nml range.      Pre-op evaluation - Primary    Moderate high risk  For orthopedic procedure given anemia history,  hx of HIT and cardiomyopathy, recent valvular repair.   Needs cardiac eval preop... pending Needs pre op labs  including Hg prior to surgery. Low risk respiratory evaluation.  No diabetes.  Albumin and nutritional level  Adequate.  He does snore and small oropharynx.. sleep apnea possible and possible difficult intubation, but has not been told of problems  intubating past.          [name], MD

## 2021-11-29 NOTE — Assessment & Plan Note (Signed)
Moderate high risk  For orthopedic procedure given anemia history,  hx of HIT and cardiomyopathy, recent valvular repair.   Needs cardiac eval preop... pending Needs pre op labs including Hg prior to surgery. Low risk respiratory evaluation.  No diabetes.  Albumin and nutritional level  Adequate.  He does snore and small oropharynx.. sleep apnea possible and possible difficult intubation, but has not been told of problems  intubating past.

## 2021-11-30 NOTE — Progress Notes (Signed)
HEART AND [place]                                     Cardiology Office Note:    Date:  12/01/2021   ID:  Gregory [name], DOB 04/11/1950, MRN [id number]  PCP:  Jinny Sanders, MD  [facility] Cardiologist:  [name], MD / Dr. Burt Knack & Dr. Cyndia Bent (structural heart) [facility] Electrophysiologist:  Vickie Epley, MD   Referring MD: Jinny Sanders, MD   1 year follow up s/p balloon valvuloplasty  History of Present Illness:    Gregory [name] is a 71 y.o. male with a hx of CAD and aortic valve disease s/p CABG/AVR 03/06/2019 with a 25 mm [name] Intuity rapid deployment valve and CABG x 3 with a left radial to PDA, LIMA-LAD, and pedicled RIMA-RPL by Dr. Julien Girt, TAA, post op HIT, HTN, obesity, chronic combined S/D CHF, HAVB s/p PPM, hemolytic anemia 2/2 severe bioprosthetic valve dysfunction with severe perivalvular leaking s/p successful balloon valvuloplasty on 10/18/20 who presents to clinic for follow up.  The patient underwent CABG/AVR 03/06/2019 with a 25 mm [name] Intuity rapid deployment valve and CABG x 3 with a left radial to PDA, LIMA-LAD, and pedicled RIMA - RPL. The patient's postoperative course was complicated by heparin-induced thrombocytopenia.  He was ultimately treated with several months of warfarin and recovered uneventfully.   The patient was hospitalized  in September 2022 with high-grade AV block and acute on chronic heart failure.  He was found to have severe paravalvular regurgitation and after extensive multidisciplinary heart team review, we elected to proceed with balloon valvuloplasty of his biological aortic valve prosthesis and pacemaker placement for high-grade conduction disease.  The patient was treated initially with a temp/permanent pacemaker lead in order to stabilize his heart rhythm perioperatively.  We then proceeded with balloon aortic valvuloplasty using a 26 mm Bard true balloon in order to  expand the patient's surgical bioprosthesis and reduce his paravalvular regurgitation.  Following the procedure, the patient had mild residual paravalvular regurgitation with marked improvement from baseline.  He then underwent permanent pacemaker placement with a CRT-D device prior to discharge.   Follow up echo in 05/2021 showed EF 50-55%, moderate AI with two jets; mean gradient 16 and PHT 362 and moderate dilatation of the ascending aorta, measuring 48 mm.   Today the patient presents to clinic for follow up. He continues to work in [employer]. Walks up and down steps at least 15 times a day. Mostly limited by knee pain.  No CP or SOB. No LE edema, orthopnea or PND. No dizziness or syncope. No blood in stool or urine. No palpitations.     Past Medical History:  Diagnosis Date   Alcohol abuse    Anxiety    Aortic valve disease    Arthritis    Coronary artery disease    Glaucoma    Hypertension    OSA (obstructive sleep apnea)    Pre-diabetes    S/P balloon aortic valvuloplasty 10/18/2020   done for bioprosthetic aortic valve replacement perivalvular leaking reducing it from severe to mild.   Shingles     Past Surgical History:  Procedure Laterality Date   AORTIC ARCH ANGIOGRAPHY N/A 10/10/2020   Procedure: AORTIC ARCH ANGIOGRAPHY;  Surgeon: Nelva Bush, MD;  Location: [facility];  Service: Cardiovascular;  Laterality:  N/A;   AORTIC VALVE REPLACEMENT N/A 03/06/2019   Procedure: AORTIC VALVE REPLACEMENT (AVR), USING INTUITY 25MM;  Surgeon: Wonda Olds, MD;  Location: [facility];  Service: Open Heart Surgery;  Laterality: N/A;   BALLOON AORTIC VALVE VALVULOPLASTY N/A 10/18/2020   Procedure: BALLOON AORTIC VALVE VALVULOPLASTY;  Surgeon: Sherren Mocha, MD;  Location: [facility];  Service: Cardiovascular;  Laterality: N/A;   BIV PACEMAKER INSERTION CRT-P N/A 10/20/2020   Procedure: BIV PACEMAKER INSERTION CRT-P;  Surgeon: Vickie Epley, MD;  Location: [facility];  Service: Cardiovascular;  Laterality: N/A;   CARDIAC CATHETERIZATION     CATARACT EXTRACTION     CORONARY ARTERY BYPASS GRAFT N/A 03/06/2019   Procedure: CORONARY ARTERY BYPASS GRAFTING (CABG), ON PUMP, TIMES THREE, USING BILATERAL INTERNAL MAMMARIES AND LEFT RADIAL ARTERY HARVEST;  Surgeon: Wonda Olds, MD;  Location: [facility];  Service: Open Heart Surgery;  Laterality: N/A;  BILATERAL IMA   LEFT HEART CATH AND CORONARY ANGIOGRAPHY N/A 03/04/2019   Procedure: LEFT HEART CATH AND CORONARY ANGIOGRAPHY;  Surgeon: Burnell Blanks, MD;  Location: [facility];  Service: Cardiovascular;  Laterality: N/A;   None     RADIAL ARTERY HARVEST Left 03/06/2019   Procedure: RADIAL ARTERY HARVEST;  Surgeon: Wonda Olds, MD;  Location: [facility];  Service: Open Heart Surgery;  Laterality: Left;   RIGHT HEART CATH AND CORONARY/GRAFT ANGIOGRAPHY N/A 10/10/2020   Procedure: RIGHT HEART CATH AND CORONARY/GRAFT ANGIOGRAPHY;  Surgeon: Nelva Bush, MD;  Location: [facility];  Service: Cardiovascular;  Laterality: N/A;   TEE WITHOUT CARDIOVERSION N/A 03/06/2019   Procedure: TRANSESOPHAGEAL ECHOCARDIOGRAM (TEE);  Surgeon: Wonda Olds, MD;  Location: [facility];  Service: Open Heart Surgery;  Laterality: N/A;   TEE WITHOUT CARDIOVERSION N/A 10/10/2020   Procedure: TRANSESOPHAGEAL ECHOCARDIOGRAM (TEE);  Surgeon: Josue Hector, MD;  Location: [facility];  Service: Cardiovascular;  Laterality: N/A;   TEE WITHOUT CARDIOVERSION N/A 10/18/2020   Procedure: TRANSESOPHAGEAL ECHOCARDIOGRAM (TEE);  Surgeon: Sherren Mocha, MD;  Location: [facility];  Service: Open Heart Surgery;  Laterality: N/A;   TEMPORARY PACEMAKER N/A 10/17/2020   Procedure: TEMPORARY PACEMAKER;  Surgeon: Evans Lance, MD;  Location: [facility];  Service: Cardiovascular;  Laterality: N/A;    Current Medications: Current Meds  Medication Sig   acetaminophen (TYLENOL) 500 MG tablet Take 1,000 mg by mouth every 6  (six) hours as needed for moderate pain.   allopurinol (ZYLOPRIM) 100 MG tablet Take 1 tablet by mouth once daily   ALPRAZolam (XANAX) 0.5 MG tablet TAKE 1 TABLET BY MOUTH ONCE DAILY AS  NEEDED  FOR  ANXIETY  TAKE  30  MINUTES  PRIOR  TO  PLANE FLIGHT OR AS NEEDED FOR ANXIETY   aspirin EC 81 MG tablet Take 1 tablet (81 mg total) by mouth daily. Swallow whole.   atorvastatin (LIPITOR) 80 MG tablet Take 1 tablet by mouth once daily   Biotin 10 MG CAPS Take by mouth.   colchicine 0.6 MG tablet Take 2 tablets by mouth once daily   furosemide (LASIX) 20 MG tablet TAKE 2 TABLETS BY MOUTH ONCE DAILY IN THE MORNING AND 1 TAB ONCE DAILY IN THE AFTERNOON   ketoconazole (NIZORAL) 2 % shampoo Apply 1 application topically 2 (two) times a week.   KLOR-CON M20 20 MEQ tablet Take 1 tablet by mouth once daily   latanoprost (XALATAN) 0.005 % ophthalmic solution Place 1 drop into both eyes every morning.  metoprolol succinate (TOPROL-XL) 25 MG 24 hr tablet Take 1 tablet (25 mg total) by mouth 2 (two) times daily.   Misc Natural Products (OSTEO BI-FLEX/5-LOXIN ADVANCED) TABS Take 2 tablets by mouth daily.   sacubitril-valsartan (ENTRESTO) 97-103 MG Take 1 tablet by mouth 2 (two) times daily.   sertraline (ZOLOFT) 50 MG tablet Take 1 tablet (50 mg total) by mouth daily.   spironolactone (ALDACTONE) 25 MG tablet Take 1 tablet by mouth once daily   traMADol (ULTRAM) 50 MG tablet TAKE 1 TABLET BY MOUTH EVERY 4 HOURS AS NEEDED FOR PAIN   traZODone (DESYREL) 50 MG tablet Take 0.5-1 tablets (25-50 mg total) by mouth at bedtime as needed for sleep.     Allergies:   Heparin   Social History   Socioeconomic History   Marital status: Married    Spouse name: Cindi   Number of children: 3   Years of education: Not on file   Highest education level: Not on file  Occupational History   Occupation: Architect  Tobacco Use   Smoking status: Never   Smokeless tobacco: Never  Vaping Use   Vaping Use: Never used   Substance and Sexual Activity   Alcohol use: Yes    Alcohol/week: 12.0 standard drinks of alcohol    Types: 12 Cans of beer per week   Drug use: Yes    Frequency: 2.0 times per week    Types: Marijuana    Comment: gummies   Sexual activity: Not on file  Other Topics Concern   Not on file  Social History Narrative   Lives at home with wife.   Social Determinants of Health   Financial Resource Strain: Low Risk  (12/20/2020)   Overall Financial Resource Strain (CARDIA)    Difficulty of Paying Living Expenses: Not very hard  Recent Concern: Financial Resource Strain - Medium Risk (11/01/2020)   Overall Financial Resource Strain (CARDIA)    Difficulty of Paying Living Expenses: Somewhat hard  Food Insecurity: No Food Insecurity (12/20/2020)   Hunger Vital Sign    Worried About Running Out of Food in the Last Year: Never true    Ran Out of Food in the Last Year: Never true  Transportation Needs: No Transportation Needs (12/20/2020)   PRAPARE - Hydrologist (Medical): No    Lack of Transportation (Non-Medical): No  Physical Activity: Sufficiently Active (12/20/2020)   Exercise Vital Sign    Days of Exercise per Week: 5 days    Minutes of Exercise per Session: 150+ min  Stress: No Stress Concern Present (12/20/2020)   [place]    Feeling of Stress : Not at all  Social Connections: Moderately Isolated (12/20/2020)   Social Connection and Isolation Panel [NHANES]    Frequency of Communication with Friends and Family: Once a week    Frequency of Social Gatherings with Friends and Family: Three times a week    Attends Religious Services: Never    Active Member of Clubs or Organizations: No    Attends Archivist Meetings: Never    Marital Status: Married     Family History: The patient's family history includes Alcohol abuse in his father; Breast cancer in his sister; Cancer  in his mother. He was adopted.  ROS:   Please see the history of present illness.    All other systems reviewed and are negative.  EKGs/Labs/Other Studies Reviewed:    The following studies were  reviewed today:      TAVR OPERATIVE NOTE     Date of Procedure:                10/18/2020   Preoperative Diagnosis:      Severe Aortic Stenosis    Postoperative Diagnosis:    Same    Procedure:        Transcatheter Balloon Aortic Valvulplasty - Percutaneous  Transfemoral Approach             Bard True balloon 26 mm              Co-Surgeons:                        Gaye Pollack, MD, Lenna Sciara, MD, and Sherren Mocha, MD   Anesthesiologist:                  Donnelly Angelica, MD   Echocardiographer:              Marry Guan, MD   Pre-operative Echo Findings: Severe paravalvular aortic insufficiency Moderate global LV systolic dysfunction   Post-operative Echo Findings: Mild residual paravalvular and central aortic insufficiency Unchanged left ventricular systolic function   ______________________     Echo 10/19/20: IMPRESSIONS   1. Left ventricular ejection fraction, by estimation, is 30 to 35%. The  left ventricle has moderately decreased function. The left ventricle  demonstrates global hypokinesis. The left ventricular internal cavity size  was moderately dilated. There is  moderate eccentric left ventricular hypertrophy. Left ventricular  diastolic parameters are consistent with Grade I diastolic dysfunction  (impaired relaxation).   2. Right ventricular systolic function is mildly reduced. The right  ventricular size is normal. There is normal pulmonary artery systolic  pressure.   3. Left atrial size was severely dilated.   4. Right atrial size was mildly dilated.   5. The mitral valve is normal in structure. Trivial mitral valve  regurgitation.   6. There is residual perivalvular aortic insufficiency, improved from the  previous study. There is mild  perivalvular insufficiency in the vicinity  of the left coronary ostium. There is trivial leak towards the mitral  annulus. The aortic valve has been  repaired/replaced. Aortic valve regurgitation is mild. There is a 25 mm  [name] valve present in the aortic position. Procedure Date: 03/06/19.  Aortic regurgitation PHT measures 564 msec. Aortic valve mean gradient  measures 13.0 mmHg. Aortic valve Vmax  measures 2.39 m/s. Aortic valve acceleration time measures 85 msec.   7. Aortic dilatation noted. There is moderate dilatation of the aortic  root, measuring 47 mm. There is moderate dilatation of the ascending  aorta, measuring 46 mm.   8. The inferior vena cava is dilated in size with >50% respiratory  variability, suggesting right atrial pressure of 8 mmHg.   Comparison(s): Prior images reviewed side by side. There is marked  improvement in the severity of the aortic insufficiency and marked  reduction in the aortic prosthesis gradients.    ______________________   10/20/20 CONCLUSIONS:  1. Successful implantation of a St Jude CRT-D for ICM and CHB 2. Successful removal of a temporary transvenous pacemaker lead 3. No early apparent complications.   ________________________  Echo 12/01/21 IMPRESSIONS   1. Left ventricular ejection fraction, by estimation, is 50 to 55%. The  left ventricle has normal function. The left ventricle has no regional  wall motion abnormalities. There is mild concentric left ventricular  hypertrophy.  Left ventricular diastolic  parameters are consistent with Grade I diastolic dysfunction (impaired  relaxation). Elevated left atrial pressure.   2. Right ventricular systolic function is normal. The right ventricular  size is normal. There is normal pulmonary artery systolic pressure.   3. Left atrial size was moderately dilated.   4. The mitral valve is normal in structure. Mild mitral valve  regurgitation.   5. There are two perivalvular aortic  insufficiency jets. The larger is  located anterolaterally (1 o'clock in short axis), with a smaller  posteromedial jet (7 o'clock in short axis). The aortic valve has been  repaired/replaced. Aortic valve regurgitation   is moderate. There is a 25 mm bioprosthetic valve present in the aortic  position. Procedure Date: 2021. Aortic regurgitation PHT measures 362  msec. Aortic valve mean gradient measures 16.0 mmHg. Aortic valve Vmax  measures 2.60 m/s. Aortic valve  acceleration time measures 77 msec.   6. Aortic dilatation noted. There is mild dilatation of the aortic root,  measuring 43 mm. There is moderate dilatation of the ascending aorta,  measuring 48 mm.   7. The inferior vena cava is normal in size with greater than 50%  respiratory variability, suggesting right atrial pressure of 3 mmHg.   Comparison(s): No significant change from prior study. Prior images  reviewed side by side. The aortic prosthesis gradients are slightly  higher, which may suggest increased flow due to aortic insufficiency, but the aortic insufficiency PHT is unchanged.   ____________________   Echo 12/01/21 IMPRESSIONS   1. Left ventricular ejection fraction, by estimation, is 60 to 65%. The  left ventricle has normal function. The left ventricle has no regional  wall motion abnormalities. There is mild left ventricular hypertrophy.  Left ventricular diastolic parameters  are consistent with Grade I diastolic dysfunction (impaired relaxation).   2. Right ventricular systolic function is normal. The right ventricular  size is normal.   3. The mitral valve is normal in structure. No evidence of mitral valve  regurgitation. No evidence of mitral stenosis.   4. The aortic valve is normal in structure. Aortic valve regurgitation is  mild. No aortic stenosis is present. Aortic valve mean gradient measures  13.0 mmHg. Aortic valve Vmax measures 2.42 m/s.   5. Aortic dilatation noted. There is moderate  dilatation of the ascending  aorta, measuring 45 mm.   6. The inferior vena cava is normal in size with greater than 50%  respiratory variability, suggesting right atrial pressure of 3 mmHg.   Comparison(s): No significant change from prior study. Prior images  reviewed side by side.   EKG:  EKG is NOT ordered today.  Recent Labs: 05/01/2021: TSH 2.90 09/12/2021: ALT 22; BUN 20; Creatinine 0.98; Hemoglobin 11.1; Platelet Count 190; Potassium 4.6; Sodium 139  Recent Lipid Panel    Component Value Date/Time   CHOL 150 04/24/2021 0836   CHOL 132 08/07/2019 0811   TRIG 94.0 04/24/2021 0836   TRIG 162 (H) 10/25/2005 1507   HDL 77.90 04/24/2021 0836   HDL 61 08/07/2019 0811   CHOLHDL 2 04/24/2021 0836   VLDL 18.8 04/24/2021 0836   LDLCALC 53 04/24/2021 0836   LDLCALC 52 08/07/2019 0811   LDLDIRECT 151.0 01/05/2016 0748     Risk Assessment/Calculations:       Physical Exam:    VS:  BP 122/74   Pulse 74   Ht '5\' 6"'$  (1.676 m)   Wt 237 lb (107.5 kg)   SpO2 94%   BMI 38.25  kg/m     Wt Readings from Last 3 Encounters:  12/01/21 237 lb (107.5 kg)  11/29/21 238 lb (108 kg)  11/15/21 236 lb (107 kg)     GEN:  Well nourished, well developed in no acute distress HEENT: Normal NECK: No JVD LYMPHATICS: No lymphadenopathy CARDIAC: RRR, no murmurs, rubs, gallops RESPIRATORY:  Clear to auscultation without rales, wheezing or rhonchi  ABDOMEN: Soft, non-tender, non-distended MUSCULOSKELETAL:  No edema; No deformity  SKIN: Warm and dry NEUROLOGIC:  Alert and oriented x 3 PSYCHIATRIC:  Normal affect   ASSESSMENT:    1. S/p aortic valvuloplasty     PLAN:    In order of problems listed above:  Severe bioprosthetic valve dysfunction with severe perivalvular leaking s/p BAV: echo today shows EF 60-65%, mild residual PVL and mean gradinet of 85m hg. Recent Hg 11.1, pulse pressure normal. He is doing quite well with NYHA class I symptoms. Mostly limited by knee pain and scheduled  for knee surgery in the near future. Continue regular follow up with Dr. HPercival Spanish     Medication Adjustments/Labs and Tests Ordered: Current medicines are reviewed at length with the patient today.  Concerns regarding medicines are outlined above.  No orders of the defined types were placed in this encounter.   No orders of the defined types were placed in this encounter.    Patient Instructions  Medication Instructions:  .isntcur  *If you need a refill on your cardiac medications before your next appointment, please call your pharmacy*   Lab Work: None ordered   If you have labs (blood work) drawn today and your tests are completely normal, you will receive your results only by: [place](if you have MyChart) OR A paper copy in the mail If you have any lab test that is abnormal or we need to change your treatment, we will call you to review the results.   Testing/Procedures: None ordered    Follow-Up: Follow up as scheduled    Other Instructions   Important Information About Sugar         Signed, KAngelena Form PA-C  12/01/2021 1:30 PM    [facility]

## 2021-12-01 ENCOUNTER — Ambulatory Visit: Payer: Medicare HMO | Admitting: Physician Assistant

## 2021-12-01 ENCOUNTER — Ambulatory Visit (HOSPITAL_COMMUNITY): Payer: Medicare HMO | Attending: Cardiovascular Disease

## 2021-12-01 VITALS — BP 122/74 | HR 74 | Ht 66.0 in | Wt 237.0 lb

## 2021-12-01 DIAGNOSIS — Z9889 Other specified postprocedural states: Secondary | ICD-10-CM | POA: Insufficient documentation

## 2021-12-01 DIAGNOSIS — I351 Nonrheumatic aortic (valve) insufficiency: Secondary | ICD-10-CM | POA: Diagnosis not present

## 2021-12-01 LAB — ECHOCARDIOGRAM COMPLETE
AR max vel: 2.62 cm2
AV Area VTI: 2.99 cm2
AV Area mean vel: 2.27 cm2
AV Mean grad: 13 mmHg
AV Peak grad: 23.4 mmHg
Ao pk vel: 2.42 m/s
Area-P 1/2: 5.54 cm2
S' Lateral: 2.9 cm

## 2021-12-01 NOTE — Patient Instructions (Signed)
Medication Instructions:  .isntcur  *If you need a refill on your cardiac medications before your next appointment, please call your pharmacy*   Lab Work: None ordered   If you have labs (blood work) drawn today and your tests are completely normal, you will receive your results only by: [place] (if you have MyChart) OR A paper copy in the mail If you have any lab test that is abnormal or we need to change your treatment, we will call you to review the results.   Testing/Procedures: None ordered    Follow-Up: Follow up as scheduled    Other Instructions   Important Information About Sugar

## 2021-12-05 ENCOUNTER — Telehealth: Payer: Self-pay

## 2021-12-05 ENCOUNTER — Inpatient Hospital Stay: Payer: Medicare HMO | Attending: Hematology and Oncology

## 2021-12-05 ENCOUNTER — Other Ambulatory Visit: Payer: Self-pay

## 2021-12-05 DIAGNOSIS — E538 Deficiency of other specified B group vitamins: Secondary | ICD-10-CM | POA: Diagnosis not present

## 2021-12-05 MED ORDER — CYANOCOBALAMIN 1000 MCG/ML IJ SOLN
1000.0000 ug | Freq: Once | INTRAMUSCULAR | Status: AC
  Administered 2021-12-05: 1000 ug via INTRAMUSCULAR
  Filled 2021-12-05: qty 1

## 2021-12-05 NOTE — Progress Notes (Signed)
Chronic Care Management Pharmacy Assistant   Name: Gregory [name]  MRN: [id number] DOB: 05/10/1950  Reason for Encounter: CCM (Hypertension Disease State)  Recent office visits:  11/29/21 [name], MD Pre-op Evaluation No changes  Recent consult visits:   12/01/21 Angelena Form, PA (Cardiology) No med changes  11/15/21 Lars Mage, MD (Cardiology) No med changes FU 1 year  Hospital visits:  None in previous 6 months  Medications: Outpatient Encounter Medications as of 12/05/2021  Medication Sig   acetaminophen (TYLENOL) 500 MG tablet Take 1,000 mg by mouth every 6 (six) hours as needed for moderate pain.   allopurinol (ZYLOPRIM) 100 MG tablet Take 1 tablet by mouth once daily   ALPRAZolam (XANAX) 0.5 MG tablet TAKE 1 TABLET BY MOUTH ONCE DAILY AS  NEEDED  FOR  ANXIETY  TAKE  30  MINUTES  PRIOR  TO  PLANE FLIGHT OR AS NEEDED FOR ANXIETY   aspirin EC 81 MG tablet Take 1 tablet (81 mg total) by mouth daily. Swallow whole.   atorvastatin (LIPITOR) 80 MG tablet Take 1 tablet by mouth once daily   Biotin 10 MG CAPS Take by mouth.   colchicine 0.6 MG tablet Take 2 tablets by mouth once daily   furosemide (LASIX) 20 MG tablet TAKE 2 TABLETS BY MOUTH ONCE DAILY IN THE MORNING AND 1 TAB ONCE DAILY IN THE AFTERNOON   ketoconazole (NIZORAL) 2 % shampoo Apply 1 application topically 2 (two) times a week.   KLOR-CON M20 20 MEQ tablet Take 1 tablet by mouth once daily   latanoprost (XALATAN) 0.005 % ophthalmic solution Place 1 drop into both eyes every morning.    metoprolol succinate (TOPROL-XL) 25 MG 24 hr tablet Take 1 tablet (25 mg total) by mouth 2 (two) times daily.   Misc Natural Products (OSTEO BI-FLEX/5-LOXIN ADVANCED) TABS Take 2 tablets by mouth daily.   sacubitril-valsartan (ENTRESTO) 97-103 MG Take 1 tablet by mouth 2 (two) times daily.   sertraline (ZOLOFT) 50 MG tablet Take 1 tablet (50 mg total) by mouth daily.   spironolactone (ALDACTONE) 25 MG tablet Take 1  tablet by mouth once daily   traMADol (ULTRAM) 50 MG tablet TAKE 1 TABLET BY MOUTH EVERY 4 HOURS AS NEEDED FOR PAIN   traZODone (DESYREL) 50 MG tablet Take 0.5-1 tablets (25-50 mg total) by mouth at bedtime as needed for sleep.   No facility-administered encounter medications on file as of 12/05/2021.   Attempted contact with patient 3 times. Unsuccessful outreach. Will atttempt contact next month.  Current antihypertensive regimen:  Entresto 97-103 mg BID Metoprolol succinate 25 mg - 1/2 tablet daily Spironolactone 25 mg daily  Potassium chloride SA 20 mEq daily  Furosemide 20 mg - 2 tab AM, 1 tab PM  Adherence Review: Is the patient currently on ACE/ARB medication? Yes Does the patient have >5 day gap between last estimated fill dates? Yes  Star Rating Drugs:  Medication:                                        Last Fill:         Day Supply Atorvastatin 80 mg                              10/10/21          90 Sacubitril-Valsartan 97-103 mg  07/13/21          30 Verified with [facility]             Summary of recommendations from last [place] visit (Date:08/11/2021)   Summary: CCM f/u visit -Pt reports ongoing breast pain, not improved by stopping potassium, vitamins. Reviewed gynecomastia and breast pain are known side effects of spironolactone -Pt requests refill for tramadol   Recommendations/Changes made from today's visit: -Advised pt to follow up with cardiology regarding spironolactone/breast pain; can consider dose reduction or switch to epleronone -Coordinate with PCP for tramadol refill   Follow up: -[facility] will call patient 3 months for HF update -Pharmacist follow up televisit scheduled for 6 months -Nurse AWV is scheduled for 12/2021   Care Gaps: Annual wellness visit in last year? Yes 04/27/2021 Most Recent BP reading: 120/60 on 11/03/2021   Upcoming appointments: AWV appointment on 12/22/2021 CCM appointment on 02/15/2022   [name], CPP notified   Marijean Niemann, [place] Assistant [phone]

## 2021-12-09 ENCOUNTER — Other Ambulatory Visit: Payer: Self-pay | Admitting: Cardiology

## 2021-12-09 IMAGING — CR DG CHEST 2V
2 series · 2 of 2 positions shown · non-contrast
Comparison: Two-view chest x-ray 10/21/2020

CLINICAL DATA: Pacemaker placement. Fall.

EXAM:
CHEST - 2 VIEW

[w chest pa]
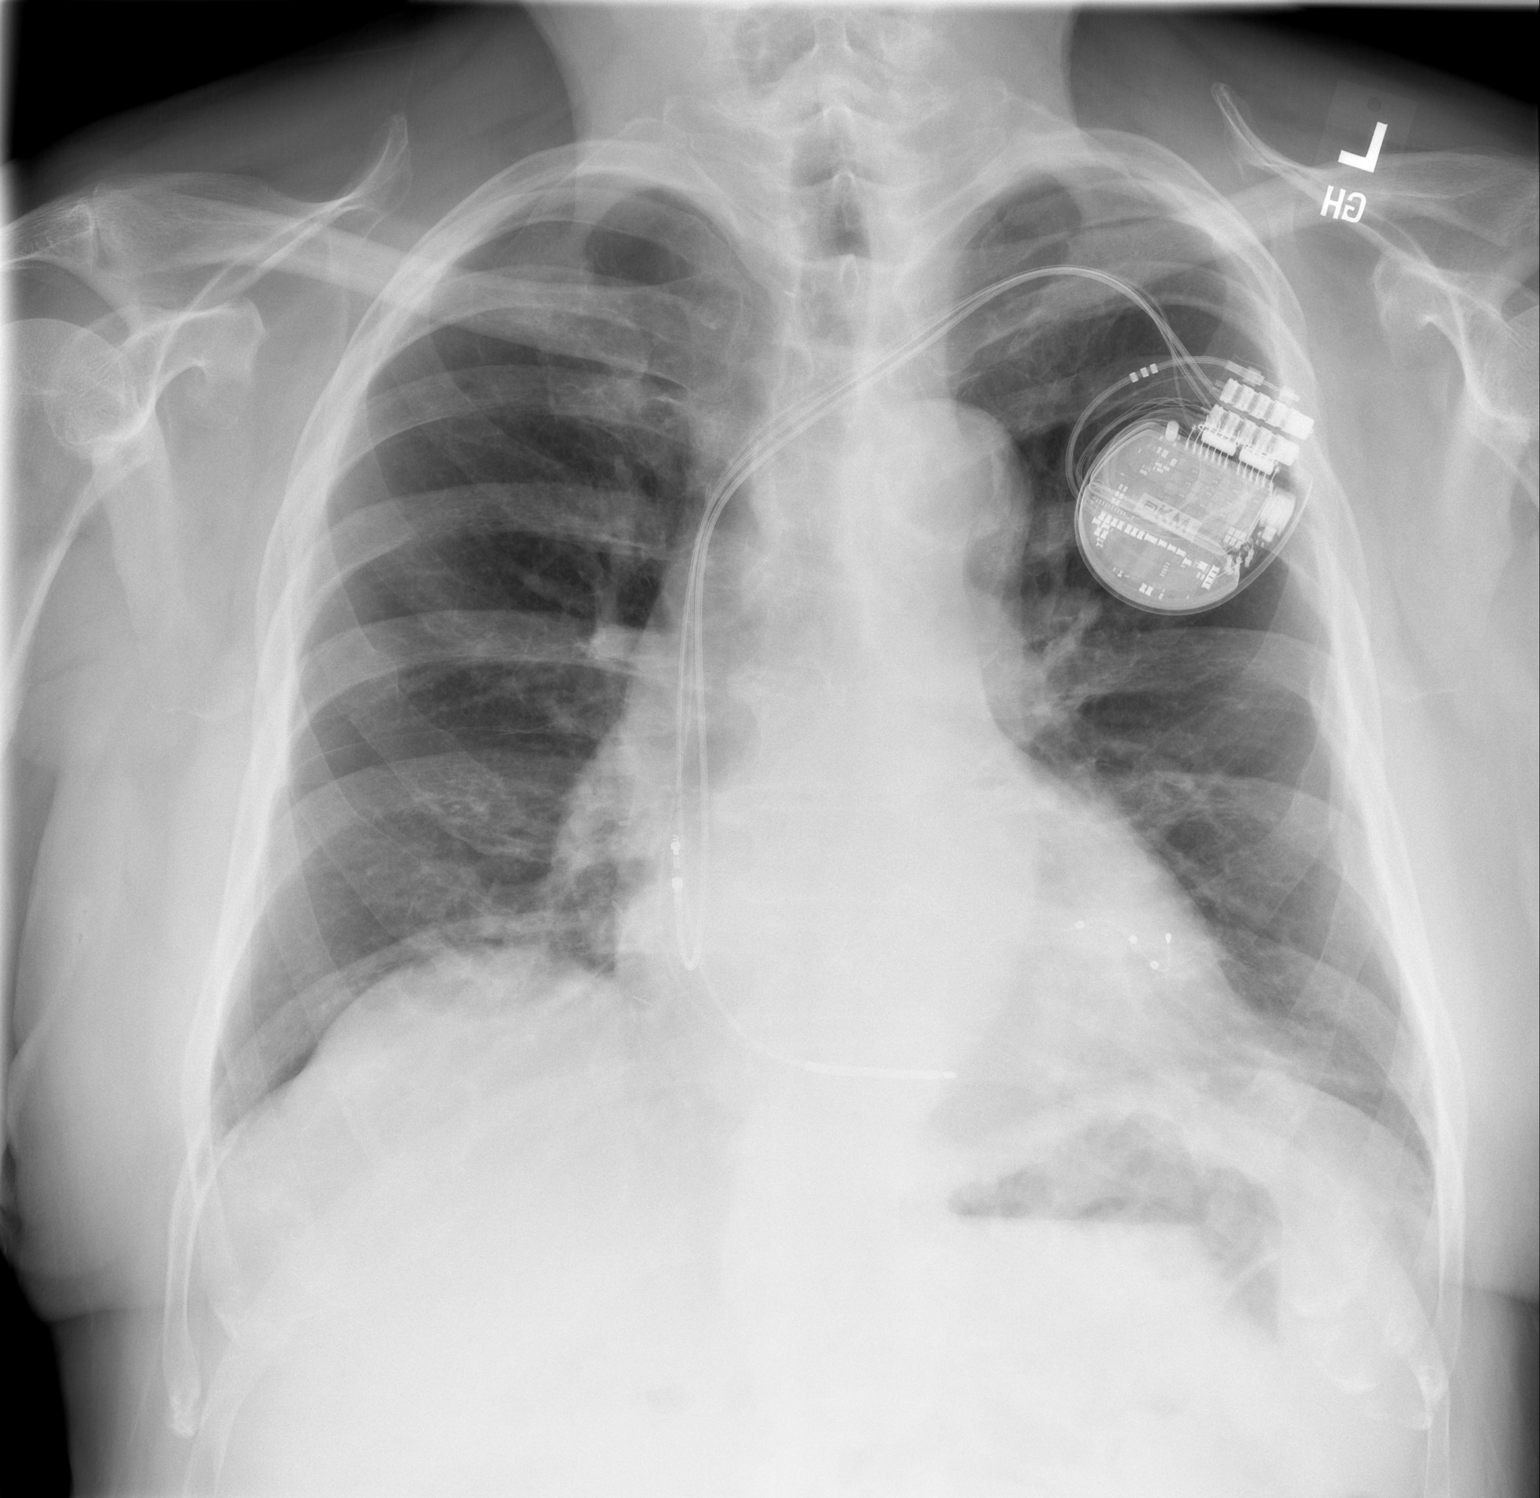

[w chest lat]
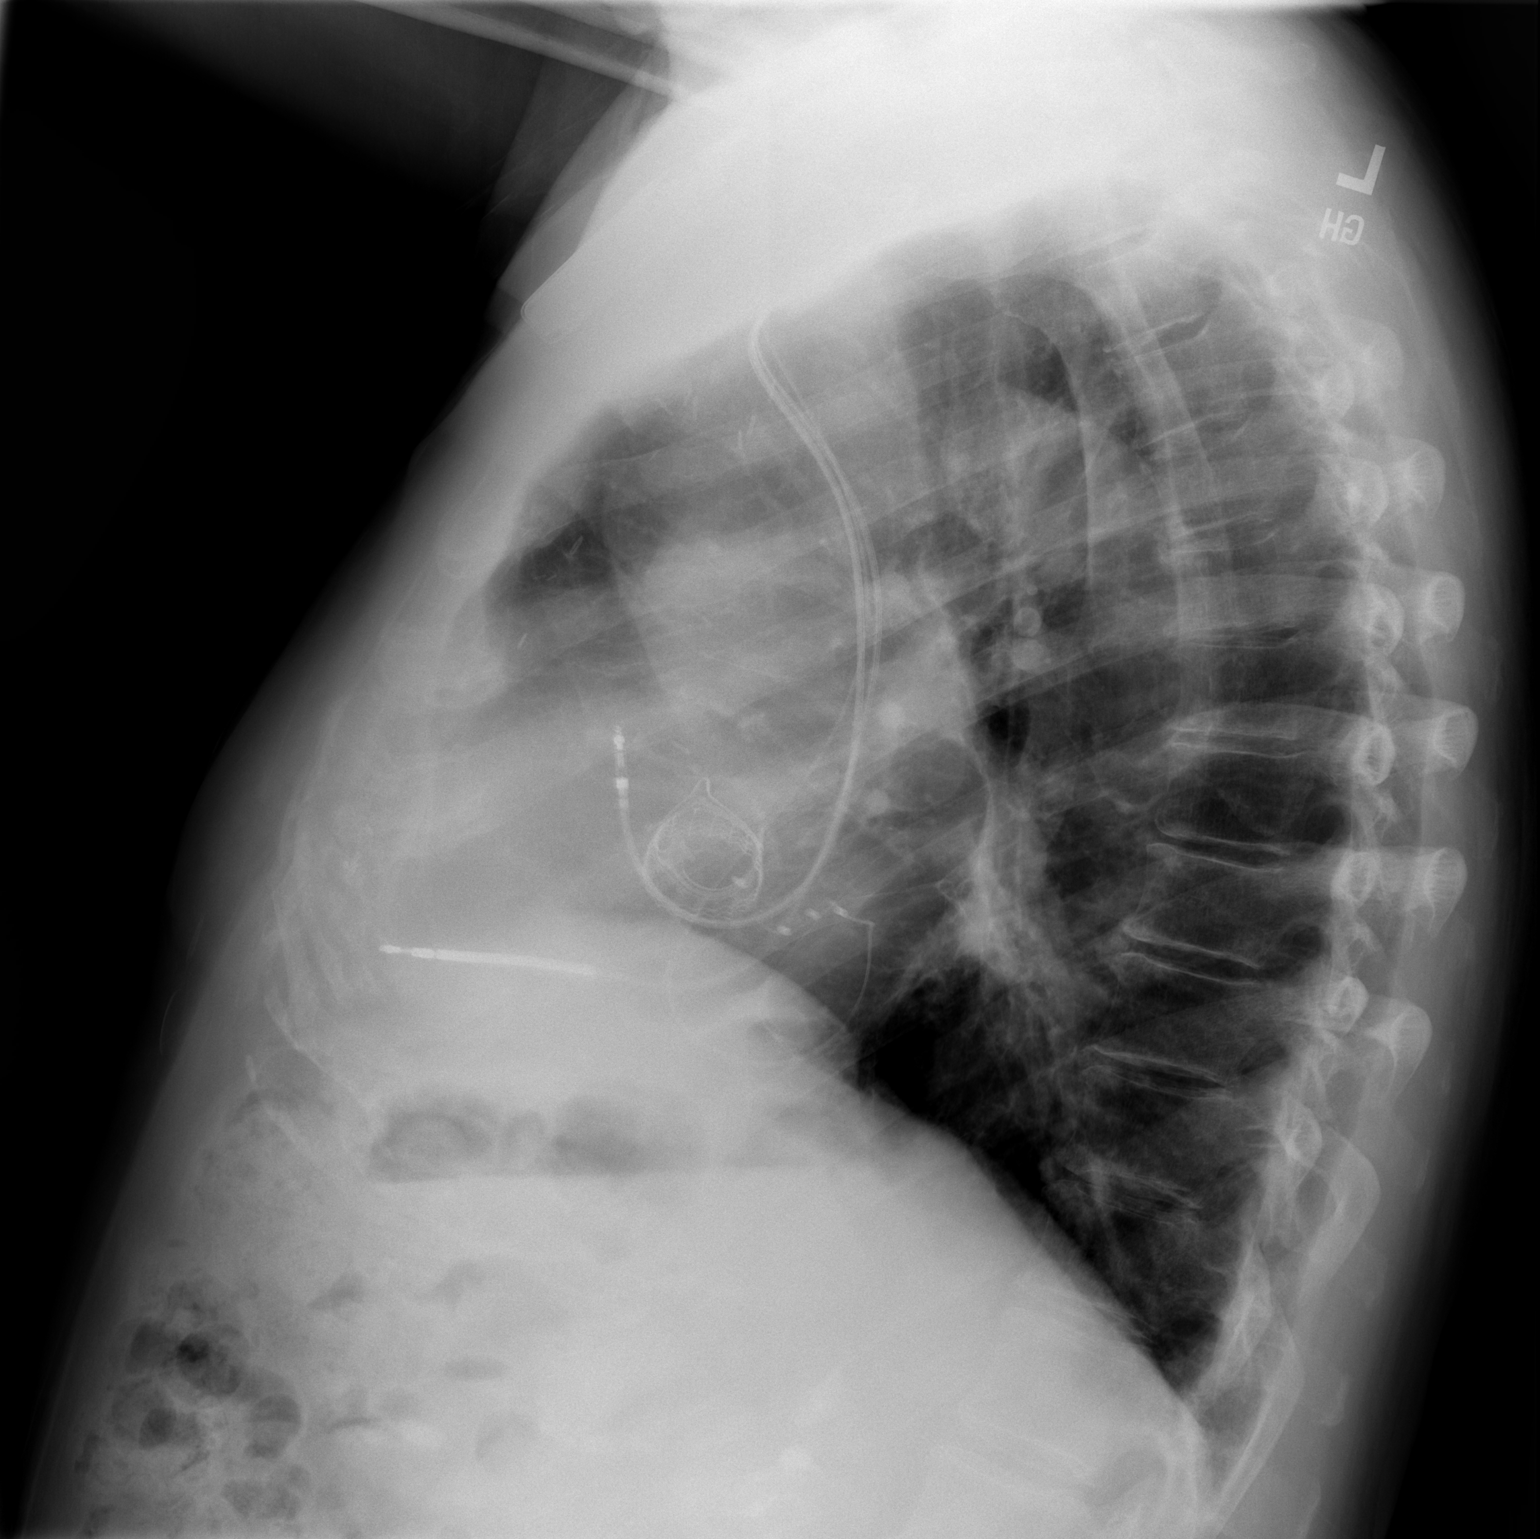

[2 of 2 positions shown; findings below may reference images not displayed]

FINDINGS: Heart is enlarged. Atherosclerotic calcifications are present in the
aorta. A dual-chamber pacemaker is stable in position. A third lead
in the cardiac vein is stable. Aortic valve replacement noted.
IMPRESSION: 1. No acute cardiopulmonary disease or significant interval change.
2. Stable cardiomegaly without failure.

## 2021-12-22 ENCOUNTER — Ambulatory Visit (INDEPENDENT_AMBULATORY_CARE_PROVIDER_SITE_OTHER): Payer: Medicare HMO

## 2021-12-22 VITALS — Ht 66.0 in | Wt 221.0 lb

## 2021-12-22 DIAGNOSIS — Z0000 Encounter for general adult medical examination without abnormal findings: Secondary | ICD-10-CM | POA: Diagnosis not present

## 2021-12-22 NOTE — Progress Notes (Signed)
Subjective:   Gregory [name] is a 71 y.o. male who presents for Medicare Annual (Subsequent) preventive examination.  Review of Systems    No ROS.  Medicare Wellness Virtual Visit.  Visual/audio telehealth visit, UTA vital signs.   See social history for additional risk factors.   Cardiac Risk Factors include: advanced age (>48mn, >>47women);male gender     Objective:    Today's Vitals   12/22/21 0819  Weight: 221 lb (100.2 kg)  Height: '5\' 6"'$  (1.676 m)   Body mass index is 35.67 kg/m.     12/22/2021    8:23 AM 09/12/2021    9:51 AM 12/20/2020    9:02 AM 10/17/2020   12:03 PM 10/10/2020    9:18 PM 09/28/2020    3:30 PM 09/09/2020    8:57 AM  Advanced Directives  Does Patient Have a Medical Advance Directive? Yes No Yes No No No No  Type of [place]  [place] will      Does patient want to make changes to medical advance directive? No - Patient declined  Yes (MAU/Ambulatory/Procedural Areas - Information given)      Copy of [place]? No - copy requested        Would patient like information on creating a medical advance directive?  No - Patient declined  No - Patient declined No - Patient declined      Current Medications (verified) Outpatient Encounter Medications as of 12/22/2021  Medication Sig   acetaminophen (TYLENOL) 500 MG tablet Take 1,000 mg by mouth every 6 (six) hours as needed for moderate pain.   allopurinol (ZYLOPRIM) 100 MG tablet Take 1 tablet by mouth once daily   ALPRAZolam (XANAX) 0.5 MG tablet TAKE 1 TABLET BY MOUTH ONCE DAILY AS  NEEDED  FOR  ANXIETY  TAKE  30  MINUTES  PRIOR  TO  PLANE FLIGHT OR AS NEEDED FOR ANXIETY   aspirin EC 81 MG tablet Take 1 tablet (81 mg total) by mouth daily. Swallow whole.   atorvastatin (LIPITOR) 80 MG tablet Take 1 tablet by mouth once daily   Biotin 10 MG CAPS Take by mouth.   colchicine 0.6 MG tablet Take 2  tablets by mouth once daily   furosemide (LASIX) 20 MG tablet TAKE 2 TABLETS BY MOUTH ONCE DAILY IN THE MORNING AND 1 TAB ONCE DAILY IN THE AFTERNOON   ketoconazole (NIZORAL) 2 % shampoo Apply 1 application topically 2 (two) times a week.   latanoprost (XALATAN) 0.005 % ophthalmic solution Place 1 drop into both eyes every morning.    metoprolol succinate (TOPROL-XL) 25 MG 24 hr tablet Take 1 tablet (25 mg total) by mouth 2 (two) times daily.   Misc Natural Products (OSTEO BI-FLEX/5-LOXIN ADVANCED) TABS Take 2 tablets by mouth daily.   potassium chloride SA (KLOR-CON M20) 20 MEQ tablet Take 1 tablet by mouth once daily   sacubitril-valsartan (ENTRESTO) 97-103 MG Take 1 tablet by mouth 2 (two) times daily.   sertraline (ZOLOFT) 50 MG tablet Take 1 tablet (50 mg total) by mouth daily.   spironolactone (ALDACTONE) 25 MG tablet Take 1 tablet by mouth once daily   traMADol (ULTRAM) 50 MG tablet TAKE 1 TABLET BY MOUTH EVERY 4 HOURS AS NEEDED FOR PAIN   traZODone (DESYREL) 50 MG tablet Take 0.5-1 tablets (25-50 mg total) by mouth at bedtime as needed for sleep.   No facility-administered encounter medications on file  as of 12/22/2021.    Allergies (verified) Heparin   History: Past Medical History:  Diagnosis Date   Alcohol abuse    Anxiety    Aortic valve disease    Arthritis    Coronary artery disease    Glaucoma    Hypertension    OSA (obstructive sleep apnea)    Pre-diabetes    S/P balloon aortic valvuloplasty 10/18/2020   done for bioprosthetic aortic valve replacement perivalvular leaking reducing it from severe to mild.   Shingles    Past Surgical History:  Procedure Laterality Date   AORTIC ARCH ANGIOGRAPHY N/A 10/10/2020   Procedure: AORTIC ARCH ANGIOGRAPHY;  Surgeon: Nelva Bush, MD;  Location: [place] [facility];  Service: Cardiovascular;  Laterality: N/A;   AORTIC VALVE REPLACEMENT N/A 03/06/2019   Procedure: AORTIC VALVE REPLACEMENT (AVR), USING INTUITY 25MM;   Surgeon: Wonda Olds, MD;  Location: [place];  Service: Open Heart Surgery;  Laterality: N/A;   BALLOON AORTIC VALVE VALVULOPLASTY N/A 10/18/2020   Procedure: BALLOON AORTIC VALVE VALVULOPLASTY;  Surgeon: Sherren Mocha, MD;  Location: [place];  Service: Cardiovascular;  Laterality: N/A;   BIV PACEMAKER INSERTION CRT-P N/A 10/20/2020   Procedure: BIV PACEMAKER INSERTION CRT-P;  Surgeon: Vickie Epley, MD;  Location: [place] [facility];  Service: Cardiovascular;  Laterality: N/A;   CARDIAC CATHETERIZATION     CATARACT EXTRACTION     CORONARY ARTERY BYPASS GRAFT N/A 03/06/2019   Procedure: CORONARY ARTERY BYPASS GRAFTING (CABG), ON PUMP, TIMES THREE, USING BILATERAL INTERNAL MAMMARIES AND LEFT RADIAL ARTERY HARVEST;  Surgeon: Wonda Olds, MD;  Location: [place];  Service: Open Heart Surgery;  Laterality: N/A;  BILATERAL IMA   LEFT HEART CATH AND CORONARY ANGIOGRAPHY N/A 03/04/2019   Procedure: LEFT HEART CATH AND CORONARY ANGIOGRAPHY;  Surgeon: Burnell Blanks, MD;  Location: [place] [facility];  Service: Cardiovascular;  Laterality: N/A;   None     RADIAL ARTERY HARVEST Left 03/06/2019   Procedure: RADIAL ARTERY HARVEST;  Surgeon: Wonda Olds, MD;  Location: [place];  Service: Open Heart Surgery;  Laterality: Left;   RIGHT HEART CATH AND CORONARY/GRAFT ANGIOGRAPHY N/A 10/10/2020   Procedure: RIGHT HEART CATH AND CORONARY/GRAFT ANGIOGRAPHY;  Surgeon: Nelva Bush, MD;  Location: [place] [facility];  Service: Cardiovascular;  Laterality: N/A;   TEE WITHOUT CARDIOVERSION N/A 03/06/2019   Procedure: TRANSESOPHAGEAL ECHOCARDIOGRAM (TEE);  Surgeon: Wonda Olds, MD;  Location: [place];  Service: Open Heart Surgery;  Laterality: N/A;   TEE WITHOUT CARDIOVERSION N/A 10/10/2020   Procedure: TRANSESOPHAGEAL ECHOCARDIOGRAM (TEE);  Surgeon: Josue Hector, MD;  Location[facility];  Service: Cardiovascular;  Laterality: N/A;   TEE WITHOUT CARDIOVERSION N/A 10/18/2020   Procedure:  TRANSESOPHAGEAL ECHOCARDIOGRAM (TEE);  Surgeon: Sherren Mocha, MD;  Location: [place];  Service: Open Heart Surgery;  Laterality: N/A;   TEMPORARY PACEMAKER N/A 10/17/2020   Procedure: TEMPORARY PACEMAKER;  Surgeon: Evans Lance, MD;  Location: [place] [facility];  Service: Cardiovascular;  Laterality: N/A;   Family History  Adopted: Yes  Problem Relation Age of Onset   Cancer Mother        breast cancer   Alcohol abuse Father    Breast cancer Sister    Social History   Socioeconomic History   Marital status: Married    Spouse name: Cindi   Number of children: 3   Years of education: Not on file   Highest education level: Not on file  Occupational History  Occupation: Architect  Tobacco Use   Smoking status: Never   Smokeless tobacco: Never  Vaping Use   Vaping Use: Never used  Substance and Sexual Activity   Alcohol use: Yes    Alcohol/week: 12.0 standard drinks of alcohol    Types: 12 Cans of beer per week   Drug use: Yes    Frequency: 2.0 times per week    Types: Marijuana    Comment: gummies   Sexual activity: Not on file  Other Topics Concern   Not on file  Social History Narrative   Lives at home with wife.   Social Determinants of Health   Financial Resource Strain: Low Risk  (12/22/2021)   Overall Financial Resource Strain (CARDIA)    Difficulty of Paying Living Expenses: Not hard at all  Food Insecurity: No Food Insecurity (12/22/2021)   Hunger Vital Sign    Worried About Running Out of Food in the Last Year: Never true    Ran Out of Food in the Last Year: Never true  Transportation Needs: No Transportation Needs (12/22/2021)   PRAPARE - Hydrologist (Medical): No    Lack of Transportation (Non-Medical): No  Physical Activity: Sufficiently Active (12/22/2021)   Exercise Vital Sign    Days of Exercise per Week: 5 days    Minutes of Exercise per Session: 150+ min  Stress: No Stress Concern Present (12/22/2021)    [place]    Feeling of Stress : Not at all  Social Connections: Moderately Isolated (12/22/2021)   Social Connection and Isolation Panel [NHANES]    Frequency of Communication with Friends and Family: Once a week    Frequency of Social Gatherings with Friends and Family: Three times a week    Attends Religious Services: Never    Active Member of Clubs or Organizations: No    Attends Archivist Meetings: Never    Marital Status: Married    Tobacco Counseling Counseling given: Not Answered   Clinical Intake:  Pre-visit preparation completed: Yes        Diabetes: No  How often do you need to have someone help you when you read instructions, pamphlets, or other written materials from your doctor or pharmacy?: 1 - Never    Interpreter Needed?: No      Activities of Daily Living    12/22/2021    8:26 AM 04/27/2021   11:24 AM  In your present state of health, do you have any difficulty performing the following activities:  Hearing? 0 0  Vision? 0 0  Difficulty concentrating or making decisions? 0 0  Walking or climbing stairs? 1 1  Comment Cane in use when ambulating using cane , knee pain  Dressing or bathing? 0 0  Doing errands, shopping? 0 0  Preparing Food and eating ? N   Using the Toilet? N   In the past six months, have you accidently leaked urine? N   Do you have problems with loss of bowel control? N   Managing your Medications? N   Managing your Finances? N   Housekeeping or managing your Housekeeping? N     Patient Care Team: Jinny Sanders, MD as PCP - General (Family Medicine) [name], MD as PCP - Cardiology (Cardiology) Vickie Epley, MD as PCP - Electrophysiology (Cardiology) Lendon Colonel, NP as Nurse Practitioner (Cardiology) Charlton Haws, [facility] as Pharmacist (Pharmacist)  Indicate any recent Medical Services you  may have received from other  than [facility] providers in the past year (date may be approximate).     Assessment:   This is a routine wellness examination for Avish.  I connected with  [name] on 12/22/21 by a audio enabled telemedicine application and verified that I am speaking with the correct person using two identifiers.  Patient Location: Home  Provider Location: Office/Clinic  I discussed the limitations of evaluation and management by telemedicine. The patient expressed understanding and agreed to proceed.   Hearing/Vision screen Hearing Screening - Comments:: Decrease in hearing conversational tones. Audiology deferred in the last year per patient preference.  Vision Screening - Comments:: Followed by Dr. Nicki Reaper, [place] Wears corrective lenses Cataract extraction, bilateral Glaucoma; drops in use They have seen their ophthalmologist in the last 6 months.   Dietary issues and exercise activities discussed: Current Exercise Habits: Home exercise routine, Type of exercise: walking, Intensity: Mild   Goals Addressed               This Visit's Progress     Patient Stated     Weight (lb) < 200 lb (90.7 kg) (pt-stated)   221 lb (100.2 kg)     Portion control      Other     Patient Stated   On track     Would like to get below 200lbs , get both knees replaced and walk on the beach with wife.       Depression Screen    12/22/2021    8:22 AM 11/29/2021    8:20 AM 04/27/2021   11:23 AM 12/20/2020    9:05 AM 12/18/2019    9:00 AM 07/20/2019   11:42 AM 05/26/2019    3:35 PM  PHQ 2/9 Scores  PHQ - 2 Score 0 0 0 0 0 0 0  PHQ- 9 Score 0 0   0      Fall Risk    12/22/2021    8:25 AM 04/27/2021   11:23 AM 12/20/2020    9:03 AM 12/18/2019    9:00 AM 05/26/2019    2:17 PM  Fall Risk   Falls in the past year? 0 1 0 0 0  Number falls in past yr: 0 0 0 0   Injury with Fall? 0  0 0   Risk for fall due to :    Impaired balance/gait;Medication side effect Other (Comment)  Risk for  fall due to: Comment Cane in use when ambulating    Balance screen, single leg stand less than 5 seconds.  Follow up Falls prevention discussed;Falls evaluation completed Falls evaluation completed Falls prevention discussed Falls evaluation completed;Falls prevention discussed Falls evaluation completed    FALL RISK PREVENTION PERTAINING TO THE HOME: Home free of loose throw rugs in walkways, pet beds, electrical cords, etc? Yes  Adequate lighting in your home to reduce risk of falls? Yes   ASSISTIVE DEVICES UTILIZED TO PREVENT FALLS: Life alert? No  Use of a cane, walker or w/c? Yes  Grab bars in the bathroom? Yes  Shower chair or bench in shower? No  Comfort chair height toilet? Yes   TIMED UP AND GO: Was the test performed? No .   Cognitive Function:    12/18/2019    9:10 AM  MMSE - Mini Mental State Exam  Orientation to time 5  Orientation to Place 5  Registration 3  Attention/ Calculation 5  Recall 3  Language- repeat 1  12/22/2021    8:34 AM  6CIT Screen  What Year? 0 points  What month? 0 points  What time? 0 points  Count back from 20 0 points  Months in reverse 0 points  Repeat phrase 0 points  Total Score 0 points   Immunizations Immunization History  Administered Date(s) Administered   Fluad Quad(high Dose 65+) 12/22/2019, 01/05/2021   Influenza, High Dose Seasonal PF 11/04/2021   Influenza,inj,Quad PF,6+ Mos 11/11/2018   Moderna Sars-Covid-2 Vaccination 01/27/2019, 02/24/2019, 10/31/2019   Pneumococcal Conjugate-13 01/11/2015   Pneumococcal Polysaccharide-23 04/17/2016   Tdap 02/25/2012   Shingrix Completed?: No.    Education has been provided regarding the importance of this vaccine. Patient has been advised to call insurance company to determine out of pocket expense if they have not yet received this vaccine. Advised may also receive vaccine at local pharmacy or Health Dept. Verbalized acceptance and understanding.  Screening Tests Health  Maintenance  Topic Date Due   COVID-19 Vaccine (4 - 2023-24 season) 01/07/2022 (Originally 09/01/2021)   Zoster Vaccines- Shingrix (1 of 2) 03/23/2022 (Originally 08/25/1969)   Fecal DNA (Cologuard)  04/28/2022 (Originally 08/24/1993)   DTaP/Tdap/Td (2 - Td or Tdap) 02/24/2022   Medicare Annual Wellness (AWV)  12/23/2022   Pneumonia Vaccine 23+ Years old  Completed   INFLUENZA VACCINE  Completed   Hepatitis C Screening  Completed   HPV VACCINES  Aged Out   Health Maintenance There are no preventive care reminders to display for this patient.  Lung Cancer Screening: (Low Dose CT Chest recommended if Age 64-80 years, 30 pack-year currently smoking OR have quit w/in 15years.) does not qualify.   Hepatitis C Screening: Completed 04/2016.  Vision Screening: Recommended annual ophthalmology exams for early detection of glaucoma and other disorders of the eye.  Dental Screening: Recommended annual dental exams for proper oral hygiene  Community Resource Referral / Chronic Care Management: CRR required this visit?  No   CCM required this visit?  No      Plan:     I have personally reviewed and noted the following in the patient's chart:   Medical and social history Use of alcohol, tobacco or illicit drugs  Current medications and supplements including opioid prescriptions. Patient is not currently taking opioid prescriptions. Functional ability and status Nutritional status Physical activity Advanced directives List of other physicians Hospitalizations, surgeries, and ER visits in previous 12 months Vitals Screenings to include cognitive, depression, and falls Referrals and appointments  In addition, I have reviewed and discussed with patient certain preventive protocols, quality metrics, and best practice recommendations. A written personalized care plan for preventive services as well as general preventive health recommendations were provided to patient.     Leta Jungling,  LPN   57/26/2035

## 2021-12-22 NOTE — Patient Instructions (Addendum)
Gregory [name] , Thank you for taking time to come for your Medicare Wellness Visit. I appreciate your ongoing commitment to your health goals. Please review the following plan we discussed and let me know if I can assist you in the future.   These are the goals we discussed:     Goals Addressed               This Visit's Progress     Patient Stated     Weight (lb) < 200 lb (90.7 kg) (pt-stated)   221 lb (100.2 kg)     Portion control      Other     Patient Stated   On track     Would like to get below 200lbs , get both knees replaced and walk on the beach with [place].       This is a list of the screening recommended for you and due dates:  Health Maintenance  Topic Date Due   COVID-19 Vaccine (4 - 2023-24 season) 01/07/2022*   Zoster (Shingles) Vaccine (1 of 2) 03/23/2022*   Cologuard (Stool DNA test)  04/28/2022*   DTaP/Tdap/Td vaccine (2 - Td or Tdap) 02/24/2022   Medicare Annual Wellness Visit  12/23/2022   Pneumonia Vaccine  Completed   Flu Shot  Completed   Hepatitis C Screening: USPSTF Recommendation to screen - Ages 18-79 yo.  Completed   HPV Vaccine  Aged Out  *Topic was postponed. The date shown is not the original due date.    Advanced directives: End of life planning; Advance aging; Advanced directives discussed.  Copy of current HCPOA/Living Will requested.    Conditions/risks identified: none new.  Next appointment: Follow up in one year for your annual wellness visit.   Preventive Care 74 Years and Older, Male  Preventive care refers to lifestyle choices and visits with your health care provider that can promote health and wellness. What does preventive care include? A yearly physical exam. This is also called an annual well check. Dental exams once or twice a year. Routine eye exams. Ask your health care provider how often you should have your eyes checked. Personal lifestyle choices, including: Daily care of your teeth and gums. Regular physical  activity. Eating a healthy diet. Avoiding tobacco and drug use. Limiting alcohol use. Practicing safe sex. Taking low doses of aspirin every day. Taking vitamin and mineral supplements as recommended by your health care provider. What happens during an annual well check? The services and screenings done by your health care provider during your annual well check will depend on your age, overall health, lifestyle risk factors, and family history of disease. Counseling  Your health care provider may ask you questions about your: Alcohol use. Tobacco use. Drug use. Emotional well-being. Home and relationship well-being. Sexual activity. Eating habits. History of falls. Memory and ability to understand (cognition). Work and work Statistician. Screening  You may have the following tests or measurements: Height, weight, and BMI. Blood pressure. Lipid and cholesterol levels. These may be checked every 5 years, or more frequently if you are over 66 years old. Skin check. Lung cancer screening. You may have this screening every year starting at age 31 if you have a 30-pack-year history of smoking and currently smoke or have quit within the past 15 years. Fecal occult blood test (FOBT) of the stool. You may have this test every year starting at age 41. Flexible sigmoidoscopy or colonoscopy. You may have a sigmoidoscopy every 5 years or  a colonoscopy every 10 years starting at age 40. Prostate cancer screening. Recommendations will vary depending on your family history and other risks. Hepatitis C blood test. Hepatitis B blood test. Sexually transmitted disease (STD) testing. Diabetes screening. This is done by checking your blood sugar (glucose) after you have not eaten for a while (fasting). You may have this done every 1-3 years. Abdominal aortic aneurysm (AAA) screening. You may need this if you are a current or former smoker. Osteoporosis. You may be screened starting at age 37 if you are  at high risk. Talk with your health care provider about your test results, treatment options, and if necessary, the need for more tests. Vaccines  Your health care provider may recommend certain vaccines, such as: Influenza vaccine. This is recommended every year. Tetanus, diphtheria, and acellular pertussis (Tdap, Td) vaccine. You may need a Td booster every 10 years. Zoster vaccine. You may need this after age 47. Pneumococcal 13-valent conjugate (PCV13) vaccine. One dose is recommended after age 25. Pneumococcal polysaccharide (PPSV23) vaccine. One dose is recommended after age 25. Talk to your health care provider about which screenings and vaccines you need and how often you need them. This information is not intended to replace advice given to you by your health care provider. Make sure you discuss any questions you have with your health care provider. Document Released: 01/14/2015 Document Revised: 09/07/2015 Document Reviewed: 10/19/2014 Elsevier Interactive Patient Education  2017 [place] Prevention in the Home Falls can cause injuries. They can happen to people of all ages. There are many things you can do to make your home safe and to help prevent falls. What can I do on the outside of my home? Regularly fix the edges of walkways and driveways and fix any cracks. Remove anything that might make you trip as you walk through a door, such as a raised step or threshold. Trim any bushes or trees on the path to your home. Use bright outdoor lighting. Clear any walking paths of anything that might make someone trip, such as rocks or tools. Regularly check to see if handrails are loose or broken. Make sure that both sides of any steps have handrails. Any raised decks and porches should have guardrails on the edges. Have any leaves, snow, or ice cleared regularly. Use sand or salt on walking paths during winter. Clean up any spills in your garage right away. This includes oil  or grease spills. What can I do in the bathroom? Use night lights. Install grab bars by the toilet and in the tub and shower. Do not use towel bars as grab bars. Use non-skid mats or decals in the tub or shower. If you need to sit down in the shower, use a plastic, non-slip stool. Keep the floor dry. Clean up any water that spills on the floor as soon as it happens. Remove soap buildup in the tub or shower regularly. Attach bath mats securely with double-sided non-slip rug tape. Do not have throw rugs and other things on the floor that can make you trip. What can I do in the bedroom? Use night lights. Make sure that you have a light by your bed that is easy to reach. Do not use any sheets or blankets that are too big for your bed. They should not hang down onto the floor. Have a firm chair that has side arms. You can use this for support while you get dressed. Do not have throw rugs and other  things on the floor that can make you trip. What can I do in the kitchen? Clean up any spills right away. Avoid walking on wet floors. Keep items that you use a lot in easy-to-reach places. If you need to reach something above you, use a strong step stool that has a grab bar. Keep electrical cords out of the way. Do not use floor polish or wax that makes floors slippery. If you must use wax, use non-skid floor wax. Do not have throw rugs and other things on the floor that can make you trip. What can I do with my stairs? Do not leave any items on the stairs. Make sure that there are handrails on both sides of the stairs and use them. Fix handrails that are broken or loose. Make sure that handrails are as long as the stairways. Check any carpeting to make sure that it is firmly attached to the stairs. Fix any carpet that is loose or worn. Avoid having throw rugs at the top or bottom of the stairs. If you do have throw rugs, attach them to the floor with carpet tape. Make sure that you have a light  switch at the top of the stairs and the bottom of the stairs. If you do not have them, ask someone to add them for you. What else can I do to help prevent falls? Wear shoes that: Do not have high heels. Have rubber bottoms. Are comfortable and fit you well. Are closed at the toe. Do not wear sandals. If you use a stepladder: Make sure that it is fully opened. Do not climb a closed stepladder. Make sure that both sides of the stepladder are locked into place. Ask someone to hold it for you, if possible. Clearly mark and make sure that you can see: Any grab bars or handrails. First and last steps. Where the edge of each step is. Use tools that help you move around (mobility aids) if they are needed. These include: Canes. Walkers. Scooters. Crutches. Turn on the lights when you go into a dark area. Replace any light bulbs as soon as they burn out. Set up your furniture so you have a clear path. Avoid moving your furniture around. If any of your floors are uneven, fix them. If there are any pets around you, be aware of where they are. Review your medicines with your doctor. Some medicines can make you feel dizzy. This can increase your chance of falling. Ask your doctor what other things that you can do to help prevent falls. This information is not intended to replace advice given to you by your health care provider. Make sure you discuss any questions you have with your health care provider. Document Released: 10/14/2008 Document Revised: 05/26/2015 Document Reviewed: 01/22/2014 Elsevier Interactive Patient Education  2017 [employer].

## 2021-12-28 NOTE — Progress Notes (Signed)
Patient Care Team: Jinny Sanders, MD as PCP - General (Family Medicine) [name], MD as PCP - Cardiology (Cardiology) Vickie Epley, MD as PCP - Electrophysiology (Cardiology) Lendon Colonel, NP as Nurse Practitioner (Cardiology) Charlton Haws, [facility] as Pharmacist (Pharmacist)  DIAGNOSIS:  Encounter Diagnosis  Name Primary?   Iron deficiency anemia, unspecified iron deficiency anemia type Yes     CHIEF COMPLIANT: Follow-up of  chronic anemia   INTERVAL HISTORY: Gregory [name] is a is a 71 y.o. with above-mentioned history of heparin-induced thrombocytopenia and chronic macrocytic anemia. He presents to the clinic today for follow-up. He reports that he is having knee surgery in February.  He receives B12 injections for his B12 deficiency anemia.  Dr. Lyla Glassing plans to perform a knee replacement surgery on 02/01/2022.   ALLERGIES:  is allergic to heparin.  MEDICATIONS:  Current Outpatient Medications  Medication Sig Dispense Refill   acetaminophen (TYLENOL) 500 MG tablet Take 1,000 mg by mouth every 6 (six) hours as needed for moderate pain.     allopurinol (ZYLOPRIM) 100 MG tablet Take 1 tablet by mouth once daily 90 tablet 1   ALPRAZolam (XANAX) 0.5 MG tablet TAKE 1 TABLET BY MOUTH ONCE DAILY AS  NEEDED  FOR  ANXIETY  TAKE  30  MINUTES  PRIOR  TO  PLANE FLIGHT OR AS NEEDED FOR ANXIETY 20 tablet 0   aspirin EC 81 MG tablet Take 1 tablet (81 mg total) by mouth daily. Swallow whole. 30 tablet 11   atorvastatin (LIPITOR) 80 MG tablet Take 1 tablet by mouth once daily 90 tablet 3   Biotin 10 MG CAPS Take by mouth.     colchicine 0.6 MG tablet Take 2 tablets by mouth once daily 60 tablet 2   FLUAD QUADRIVALENT 0.5 ML injection Inject 0.5 mLs into the muscle.     furosemide (LASIX) 20 MG tablet TAKE 2 TABLETS BY MOUTH ONCE DAILY IN THE MORNING AND 1 TAB ONCE DAILY IN THE AFTERNOON 180 tablet 2   ketoconazole (NIZORAL) 2 % shampoo Apply 1 application topically 2  (two) times a week. 120 mL 1   latanoprost (XALATAN) 0.005 % ophthalmic solution Place 1 drop into both eyes every morning.      metoprolol succinate (TOPROL-XL) 25 MG 24 hr tablet Take 1 tablet (25 mg total) by mouth 2 (two) times daily. 180 tablet 3   Misc Natural Products (OSTEO BI-FLEX/5-LOXIN ADVANCED) TABS Take 2 tablets by mouth daily.     potassium chloride SA (KLOR-CON M20) 20 MEQ tablet Take 1 tablet by mouth once daily 90 tablet 3   sacubitril-valsartan (ENTRESTO) 97-103 MG Take 1 tablet by mouth 2 (two) times daily. 60 tablet 11   sertraline (ZOLOFT) 50 MG tablet Take 1 tablet (50 mg total) by mouth daily. 90 tablet 1   spironolactone (ALDACTONE) 25 MG tablet Take 1 tablet by mouth once daily 90 tablet 1   traMADol (ULTRAM) 50 MG tablet TAKE 1 TABLET BY MOUTH EVERY 4 HOURS AS NEEDED FOR PAIN 30 tablet 0   traZODone (DESYREL) 50 MG tablet Take 0.5-1 tablets (25-50 mg total) by mouth at bedtime as needed for sleep. 90 tablet 1   No current facility-administered medications for this visit.    PHYSICAL EXAMINATION: ECOG PERFORMANCE STATUS: 1 - Symptomatic but completely ambulatory  Vitals:   01/02/22 0820  BP: 132/66  Pulse: 73  Temp: 97.6 F (36.4 C)  SpO2: 94%   Filed Weights   01/02/22  0820  Weight: 234 lb 1.6 oz (106.2 kg)    LABORATORY DATA:  I have reviewed the data as listed    Latest Ref Rng & Units 09/12/2021    9:18 AM 06/20/2021    8:12 AM 05/01/2021    8:07 AM  CMP  Glucose 70 - 99 mg/dL 117  114  88   BUN 8 - 23 mg/dL '20  20  25   '$ Creatinine 0.61 - 1.24 mg/dL 0.98  0.85  0.90   Sodium 135 - 145 mmol/L 139  142  140   Potassium 3.5 - 5.1 mmol/L 4.6  4.1  4.7   Chloride 98 - 111 mmol/L 104  108  105   CO2 22 - 32 mmol/L '30  29  28   '$ Calcium 8.9 - 10.3 mg/dL 9.1  8.9  8.8   Total Protein 6.5 - 8.1 g/dL 6.7  6.5    Total Bilirubin 0.3 - 1.2 mg/dL 1.8  1.4    Alkaline Phos 38 - 126 U/L 36  41    AST 15 - 41 U/L 63  45    ALT 0 - 44 U/L 22  16      Lab  Results  Component Value Date   WBC 5.1 01/02/2022   HGB 10.8 (L) 01/02/2022   HCT 32.1 (L) 01/02/2022   MCV 103.5 (H) 01/02/2022   PLT 183 01/02/2022   NEUTROABS 3.1 01/02/2022    ASSESSMENT & PLAN:  Iron deficiency anemia Work-up: TSH: 2.56: Normal CMP: AST 43, ALT 24, bilirubin 1.9, LDH 1004 (direct Coombs test negative) SPEP: No M protein Reticulocyte count: 3.8%, absolute reticulocyte count 122, reticulocyte hemoglobin: 23.9 (suggestive of iron deficiency or ineffective erythropoiesis) N62: 952, folic acid 84.1 Erythropoietin: 235.3 09/16/2020: Iron studies revealed ferritin of 14 and iron saturation of 5.3% (IV iron treatment)   Elevated LDH with normal direct Coombs test: Concern for intravascular hemolysis possibly from severe valvular heart disease   11/17/2020: Hemoglobin 9.6, WBC 3.3, platelets 142, ANC 1.9, ferritin 158, iron saturation 31% (improv from 5%) 01/13/21: Hb 9.9, WBC: 5.5, MCV 105.6, Platelet 165, TB 1.3, Ferritin 50, Iron Sat: 25%,  01/16/2021: Hemoglobin 9.2, MCV 105.1, B12 117 (start B12 injections) 02/13/2021: Hemoglobin 10, MCV 103.1,   06/20/2021: Hemoglobin 10, MCV 100.3  09/12/2021: Hemoglobin 11.1 01/02/2022: Hemoglobin 10.8   Knee replacement surgery has been set up for February 01, 2022. He received B12 injection today. We will cancel next month B12 injection because he will be getting the surgery. We will see him March 1 week for labs and injection appointment.    Orders Placed This Encounter  Procedures   CBC with Differential ([facility] Only)    Standing Status:   Future    Standing Expiration Date:   01/03/2023   CMP ([facility] only)    Standing Status:   Future    Standing Expiration Date:   01/03/2023   Sample to Blood Bank    Standing Status:   Future    Standing Expiration Date:   01/03/2023   The patient has a good understanding of the overall plan. he agrees with it. he will call with any problems that may develop before the next  visit here. Total time spent: 30 mins including face to face time and time spent for planning, charting and co-ordination of care   Harriette Ohara, MD 01/02/22    I Gardiner Coins am acting as a Education administrator for Dr.Lavi Sheehan  I have  reviewed the above documentation for accuracy and completeness, and I agree with the above.

## 2022-01-02 ENCOUNTER — Inpatient Hospital Stay (HOSPITAL_BASED_OUTPATIENT_CLINIC_OR_DEPARTMENT_OTHER): Payer: Medicare HMO | Admitting: Hematology and Oncology

## 2022-01-02 ENCOUNTER — Inpatient Hospital Stay: Payer: Medicare HMO

## 2022-01-02 ENCOUNTER — Inpatient Hospital Stay: Payer: Medicare HMO | Attending: Hematology and Oncology

## 2022-01-02 VITALS — BP 132/66 | HR 73 | Temp 97.6°F | Wt 234.1 lb

## 2022-01-02 DIAGNOSIS — D509 Iron deficiency anemia, unspecified: Secondary | ICD-10-CM

## 2022-01-02 DIAGNOSIS — E538 Deficiency of other specified B group vitamins: Secondary | ICD-10-CM

## 2022-01-02 DIAGNOSIS — D519 Vitamin B12 deficiency anemia, unspecified: Secondary | ICD-10-CM | POA: Diagnosis not present

## 2022-01-02 DIAGNOSIS — N189 Chronic kidney disease, unspecified: Secondary | ICD-10-CM | POA: Diagnosis not present

## 2022-01-02 DIAGNOSIS — D631 Anemia in chronic kidney disease: Secondary | ICD-10-CM | POA: Insufficient documentation

## 2022-01-02 LAB — CBC WITH DIFFERENTIAL (CANCER CENTER ONLY)
Abs Immature Granulocytes: 0.01 10*3/uL (ref 0.00–0.07)
Basophils Absolute: 0 10*3/uL (ref 0.0–0.1)
Basophils Relative: 0 %
Eosinophils Absolute: 0.1 10*3/uL (ref 0.0–0.5)
Eosinophils Relative: 2 %
HCT: 32.1 % — ABNORMAL LOW (ref 39.0–52.0)
Hemoglobin: 10.8 g/dL — ABNORMAL LOW (ref 13.0–17.0)
Immature Granulocytes: 0 %
Lymphocytes Relative: 28 %
Lymphs Abs: 1.4 10*3/uL (ref 0.7–4.0)
MCH: 34.8 pg — ABNORMAL HIGH (ref 26.0–34.0)
MCHC: 33.6 g/dL (ref 30.0–36.0)
MCV: 103.5 fL — ABNORMAL HIGH (ref 80.0–100.0)
Monocytes Absolute: 0.5 10*3/uL (ref 0.1–1.0)
Monocytes Relative: 9 %
Neutro Abs: 3.1 10*3/uL (ref 1.7–7.7)
Neutrophils Relative %: 61 %
Platelet Count: 183 10*3/uL (ref 150–400)
RBC: 3.1 MIL/uL — ABNORMAL LOW (ref 4.22–5.81)
RDW: 14.4 % (ref 11.5–15.5)
WBC Count: 5.1 10*3/uL (ref 4.0–10.5)
nRBC: 0 % (ref 0.0–0.2)

## 2022-01-02 LAB — IRON AND IRON BINDING CAPACITY (CC-WL,HP ONLY)
Iron: 104 ug/dL (ref 45–182)
Saturation Ratios: 32 % (ref 17.9–39.5)
TIBC: 330 ug/dL (ref 250–450)
UIBC: 226 ug/dL (ref 117–376)

## 2022-01-02 LAB — FERRITIN: Ferritin: 46 ng/mL (ref 24–336)

## 2022-01-02 MED ORDER — CYANOCOBALAMIN 1000 MCG/ML IJ SOLN
1000.0000 ug | Freq: Once | INTRAMUSCULAR | Status: AC
  Administered 2022-01-02: 1000 ug via INTRAMUSCULAR
  Filled 2022-01-02: qty 1

## 2022-01-02 NOTE — Assessment & Plan Note (Signed)
Work-up: TSH: 2.56: Normal CMP: AST 43, ALT 24, bilirubin 1.9, LDH 1004 (direct Coombs test negative) SPEP: No M protein Reticulocyte count: 3.8%, absolute reticulocyte count 122, reticulocyte hemoglobin: 23.9 (suggestive of iron deficiency or ineffective erythropoiesis) Y34: 961, folic acid 16.4 Erythropoietin: 235.3 09/16/2020: Iron studies revealed ferritin of 14 and iron saturation of 5.3% (IV iron treatment)   Elevated LDH with normal direct Coombs test: Concern for intravascular hemolysis possibly from severe valvular heart disease   11/17/2020: Hemoglobin 9.6, WBC 3.3, platelets 142, ANC 1.9, ferritin 158, iron saturation 31% (improv from 5%) 01/13/21: Hb 9.9, WBC: 5.5, MCV 105.6, Platelet 165, TB 1.3, Ferritin 50, Iron Sat: 25%,  01/16/2021: Hemoglobin 9.2, MCV 105.1, B12 117 (start B12 injections) 02/13/2021: Hemoglobin 10, MCV 103.1,   06/20/2021: Hemoglobin 10, MCV 100.3  09/12/2021: Hemoglobin 11.1 01/02/2022: Hemoglobin   Knee replacement surgery: Has been postponed   He will come monthly for B12 injections and in 4 months with labs and MD visit.

## 2022-01-05 ENCOUNTER — Telehealth: Payer: Self-pay | Admitting: Hematology and Oncology

## 2022-01-05 ENCOUNTER — Other Ambulatory Visit: Payer: Self-pay | Admitting: Hematology and Oncology

## 2022-01-05 NOTE — Telephone Encounter (Signed)
Scheduled appointment per 1/4 staff message. Left voicemail.

## 2022-01-05 NOTE — Progress Notes (Signed)
Anemia of chronic disease: Starting patient on Retacrit 40,000 units every 3 weeks.

## 2022-01-08 DIAGNOSIS — M1712 Unilateral primary osteoarthritis, left knee: Secondary | ICD-10-CM | POA: Diagnosis not present

## 2022-01-09 NOTE — Progress Notes (Signed)
Fax received for medical clearance/medication hold for Dr. Scot Dock.  Provider signed, form faxed back to sender, verified successful, sent to scan center.

## 2022-01-10 ENCOUNTER — Encounter: Payer: Self-pay | Admitting: *Deleted

## 2022-01-10 NOTE — Progress Notes (Unsigned)
Received message from [facility] team stating pt has been approved to receive Aranesp for treatment of anemia prior to surgery.  Pt able to proceed with injection as long as hgb is below 13.0.  pt scheduled for injection appts.

## 2022-01-11 ENCOUNTER — Inpatient Hospital Stay: Payer: Medicare HMO

## 2022-01-11 VITALS — BP 125/67 | HR 72 | Temp 98.3°F | Resp 18

## 2022-01-11 DIAGNOSIS — N189 Chronic kidney disease, unspecified: Secondary | ICD-10-CM | POA: Diagnosis not present

## 2022-01-11 DIAGNOSIS — D631 Anemia in chronic kidney disease: Secondary | ICD-10-CM | POA: Diagnosis not present

## 2022-01-11 DIAGNOSIS — D509 Iron deficiency anemia, unspecified: Secondary | ICD-10-CM

## 2022-01-11 DIAGNOSIS — D519 Vitamin B12 deficiency anemia, unspecified: Secondary | ICD-10-CM | POA: Diagnosis not present

## 2022-01-11 DIAGNOSIS — E538 Deficiency of other specified B group vitamins: Secondary | ICD-10-CM

## 2022-01-11 LAB — CBC WITH DIFFERENTIAL (CANCER CENTER ONLY)
Abs Immature Granulocytes: 0.01 10*3/uL (ref 0.00–0.07)
Basophils Absolute: 0 10*3/uL (ref 0.0–0.1)
Basophils Relative: 1 %
Eosinophils Absolute: 0.1 10*3/uL (ref 0.0–0.5)
Eosinophils Relative: 3 %
HCT: 32.7 % — ABNORMAL LOW (ref 39.0–52.0)
Hemoglobin: 11.1 g/dL — ABNORMAL LOW (ref 13.0–17.0)
Immature Granulocytes: 0 %
Lymphocytes Relative: 32 %
Lymphs Abs: 1.3 10*3/uL (ref 0.7–4.0)
MCH: 34.8 pg — ABNORMAL HIGH (ref 26.0–34.0)
MCHC: 33.9 g/dL (ref 30.0–36.0)
MCV: 102.5 fL — ABNORMAL HIGH (ref 80.0–100.0)
Monocytes Absolute: 0.4 10*3/uL (ref 0.1–1.0)
Monocytes Relative: 10 %
Neutro Abs: 2.2 10*3/uL (ref 1.7–7.7)
Neutrophils Relative %: 54 %
Platelet Count: 176 10*3/uL (ref 150–400)
RBC: 3.19 MIL/uL — ABNORMAL LOW (ref 4.22–5.81)
RDW: 13.2 % (ref 11.5–15.5)
WBC Count: 4 10*3/uL (ref 4.0–10.5)
nRBC: 0 % (ref 0.0–0.2)

## 2022-01-11 LAB — CMP (CANCER CENTER ONLY)
ALT: 19 U/L (ref 0–44)
AST: 50 U/L — ABNORMAL HIGH (ref 15–41)
Albumin: 4.4 g/dL (ref 3.5–5.0)
Alkaline Phosphatase: 39 U/L (ref 38–126)
Anion gap: 5 (ref 5–15)
BUN: 18 mg/dL (ref 8–23)
CO2: 30 mmol/L (ref 22–32)
Calcium: 9.4 mg/dL (ref 8.9–10.3)
Chloride: 106 mmol/L (ref 98–111)
Creatinine: 0.79 mg/dL (ref 0.61–1.24)
GFR, Estimated: >60 mL/min (ref >60)
Glucose, Bld: 90 mg/dL (ref 70–99)
Potassium: 4.4 mmol/L (ref 3.5–5.1)
Sodium: 141 mmol/L (ref 135–145)
Total Bilirubin: 1.4 mg/dL — ABNORMAL HIGH (ref 0.3–1.2)
Total Protein: 6.7 g/dL (ref 6.5–8.1)

## 2022-01-11 LAB — SAMPLE TO BLOOD BANK

## 2022-01-11 MED ORDER — EPOETIN ALFA 40000 UNIT/ML IJ SOLN
40000.0000 [IU] | Freq: Once | INTRAMUSCULAR | Status: AC
  Administered 2022-01-11: 40000 [IU] via SUBCUTANEOUS
  Filled 2022-01-11: qty 1

## 2022-01-11 NOTE — Progress Notes (Signed)
Per Heinz Knuckles RN, okay to proceed with Epogen injection with Hgb of 11.1 today due to patient trying to build his Hgb up for a surgery that is coming up.

## 2022-01-12 ENCOUNTER — Inpatient Hospital Stay: Payer: Medicare HMO

## 2022-01-12 ENCOUNTER — Ambulatory Visit: Payer: Self-pay | Admitting: Student

## 2022-01-12 DIAGNOSIS — R7303 Prediabetes: Secondary | ICD-10-CM

## 2022-01-13 ENCOUNTER — Other Ambulatory Visit: Payer: Self-pay | Admitting: Family Medicine

## 2022-01-13 DIAGNOSIS — F411 Generalized anxiety disorder: Secondary | ICD-10-CM

## 2022-01-17 DIAGNOSIS — I739 Peripheral vascular disease, unspecified: Secondary | ICD-10-CM | POA: Insufficient documentation

## 2022-01-18 ENCOUNTER — Ambulatory Visit: Payer: Medicare HMO | Attending: Cardiology

## 2022-01-18 DIAGNOSIS — I420 Dilated cardiomyopathy: Secondary | ICD-10-CM | POA: Diagnosis not present

## 2022-01-18 LAB — CUP PACEART REMOTE DEVICE CHECK
Battery Remaining Longevity: 68 mo
Battery Remaining Percentage: 81 %
Battery Voltage: 2.96 V
Brady Statistic AP VP Percent: 91 %
Brady Statistic AP VS Percent: 1 %
Brady Statistic AS VP Percent: 8.9 %
Brady Statistic AS VS Percent: 1 %
Brady Statistic RA Percent Paced: 91 %
Date Time Interrogation Session: 20240118010039
HighPow Impedance: 60 Ohm
Implantable Lead Connection Status: 753985
Implantable Lead Connection Status: 753985
Implantable Lead Connection Status: 753985
Implantable Lead Implant Date: 20221020
Implantable Lead Implant Date: 20221020
Implantable Lead Implant Date: 20221020
Implantable Lead Location: 753858
Implantable Lead Location: 753859
Implantable Lead Location: 753860
Implantable Pulse Generator Implant Date: 20221020
Lead Channel Impedance Value: 410 Ohm
Lead Channel Impedance Value: 430 Ohm
Lead Channel Impedance Value: 730 Ohm
Lead Channel Pacing Threshold Amplitude: 0.625 V
Lead Channel Pacing Threshold Amplitude: 0.75 V
Lead Channel Pacing Threshold Amplitude: 2.25 V
Lead Channel Pacing Threshold Pulse Width: 0.5 ms
Lead Channel Pacing Threshold Pulse Width: 0.5 ms
Lead Channel Pacing Threshold Pulse Width: 0.7 ms
Lead Channel Sensing Intrinsic Amplitude: 12 mV
Lead Channel Sensing Intrinsic Amplitude: 2.4 mV
Lead Channel Setting Pacing Amplitude: 1.625
Lead Channel Setting Pacing Amplitude: 2.5 V
Lead Channel Setting Pacing Amplitude: 2.75 V
Lead Channel Setting Pacing Pulse Width: 0.5 ms
Lead Channel Setting Pacing Pulse Width: 0.7 ms
Lead Channel Setting Sensing Sensitivity: 0.5 mV
Pulse Gen Serial Number: 111052020
Zone Setting Status: 755011

## 2022-01-22 ENCOUNTER — Other Ambulatory Visit: Payer: Self-pay | Admitting: *Deleted

## 2022-01-22 DIAGNOSIS — I739 Peripheral vascular disease, unspecified: Secondary | ICD-10-CM

## 2022-01-22 DIAGNOSIS — I872 Venous insufficiency (chronic) (peripheral): Secondary | ICD-10-CM

## 2022-01-22 NOTE — Progress Notes (Signed)
Fax received from [facility] @ [facility] for medical clearance/medication hold for Dr. Scot Dock for a left total knee arthroplasty.  Provider signed stating that the pt needed to be seen again prior to vascular clearance, form faxed back to sender, verified successful, sent to scan center. Called Kerri Maze @ [facility] to explain. Surgery canceled and will be rescheduled. She faxed another medical clearance form to be signed after office visit. Confirmed understanding.

## 2022-01-23 ENCOUNTER — Encounter (HOSPITAL_COMMUNITY): Admission: RE | Admit: 2022-01-23 | Payer: Medicare HMO | Source: Ambulatory Visit

## 2022-01-24 ENCOUNTER — Telehealth: Payer: Self-pay | Admitting: Hematology and Oncology

## 2022-01-24 ENCOUNTER — Encounter: Payer: Self-pay | Admitting: Vascular Surgery

## 2022-01-24 ENCOUNTER — Other Ambulatory Visit: Payer: Self-pay | Admitting: *Deleted

## 2022-01-24 DIAGNOSIS — D509 Iron deficiency anemia, unspecified: Secondary | ICD-10-CM

## 2022-01-24 NOTE — Assessment & Plan Note (Signed)
TSH: 2.56: Normal CMP: AST 43, ALT 24, bilirubin 1.9, LDH 1004 (direct Coombs test negative) SPEP: No M protein Reticulocyte count: 3.8%, absolute reticulocyte count 122, reticulocyte hemoglobin: 23.9 (suggestive of iron deficiency or ineffective erythropoiesis) E10: 071, folic acid 21.9 Erythropoietin: 235.3 09/16/2020: Iron studies revealed ferritin of 14 and iron saturation of 5.3% (IV iron treatment)   Elevated LDH with normal direct Coombs test: Concern for intravascular hemolysis possibly from severe valvular heart disease   11/17/2020: Hemoglobin 9.6, WBC 3.3, platelets 142, ANC 1.9, ferritin 158, iron saturation 31% (improv from 5%) 01/13/21: Hb 9.9, WBC: 5.5, MCV 105.6, Platelet 165, TB 1.3, Ferritin 50, Iron Sat: 25%,  01/16/2021: Hemoglobin 9.2, MCV 105.1, B12 117 (start B12 injections) 02/13/2021: Hemoglobin 10, MCV 103.1,   06/20/2021: Hemoglobin 10, MCV 100.3  09/12/2021: Hemoglobin 11.1 01/02/2022: Hemoglobin 10.8  Knee replacement surgery has been set up for February 01, 2022. He received B12 injection today. We will cancel next month B12 injection because he will be getting the surgery. We will see him March 1 week for labs and injection appointment.

## 2022-01-24 NOTE — Telephone Encounter (Signed)
Rescheduled appointment per provider PAL. Patient is aware of the changes made to his upcoming appointment.

## 2022-01-25 ENCOUNTER — Inpatient Hospital Stay: Payer: Medicare HMO

## 2022-01-25 ENCOUNTER — Encounter: Payer: Self-pay | Admitting: Hematology and Oncology

## 2022-01-25 ENCOUNTER — Inpatient Hospital Stay (HOSPITAL_BASED_OUTPATIENT_CLINIC_OR_DEPARTMENT_OTHER): Payer: Medicare HMO | Admitting: Hematology and Oncology

## 2022-01-25 VITALS — BP 142/70 | HR 71 | Temp 97.7°F | Resp 14 | Wt 236.6 lb

## 2022-01-25 DIAGNOSIS — N189 Chronic kidney disease, unspecified: Secondary | ICD-10-CM | POA: Diagnosis not present

## 2022-01-25 DIAGNOSIS — D509 Iron deficiency anemia, unspecified: Secondary | ICD-10-CM | POA: Diagnosis not present

## 2022-01-25 DIAGNOSIS — E538 Deficiency of other specified B group vitamins: Secondary | ICD-10-CM

## 2022-01-25 DIAGNOSIS — D519 Vitamin B12 deficiency anemia, unspecified: Secondary | ICD-10-CM | POA: Diagnosis not present

## 2022-01-25 DIAGNOSIS — D631 Anemia in chronic kidney disease: Secondary | ICD-10-CM | POA: Diagnosis not present

## 2022-01-25 LAB — CBC WITH DIFFERENTIAL (CANCER CENTER ONLY)
Abs Immature Granulocytes: 0.01 10*3/uL (ref 0.00–0.07)
Basophils Absolute: 0 10*3/uL (ref 0.0–0.1)
Basophils Relative: 0 %
Eosinophils Absolute: 0.1 10*3/uL (ref 0.0–0.5)
Eosinophils Relative: 3 %
HCT: 30.8 % — ABNORMAL LOW (ref 39.0–52.0)
Hemoglobin: 10.5 g/dL — ABNORMAL LOW (ref 13.0–17.0)
Immature Granulocytes: 0 %
Lymphocytes Relative: 28 %
Lymphs Abs: 1.3 10*3/uL (ref 0.7–4.0)
MCH: 33.7 pg (ref 26.0–34.0)
MCHC: 34.1 g/dL (ref 30.0–36.0)
MCV: 98.7 fL (ref 80.0–100.0)
Monocytes Absolute: 0.4 10*3/uL (ref 0.1–1.0)
Monocytes Relative: 9 %
Neutro Abs: 2.8 10*3/uL (ref 1.7–7.7)
Neutrophils Relative %: 60 %
Platelet Count: 188 10*3/uL (ref 150–400)
RBC: 3.12 MIL/uL — ABNORMAL LOW (ref 4.22–5.81)
RDW: 12.7 % (ref 11.5–15.5)
WBC Count: 4.5 10*3/uL (ref 4.0–10.5)
nRBC: 0 % (ref 0.0–0.2)

## 2022-01-25 LAB — SAMPLE TO BLOOD BANK

## 2022-01-25 MED ORDER — EPOETIN ALFA 40000 UNIT/ML IJ SOLN
40000.0000 [IU] | Freq: Once | INTRAMUSCULAR | Status: AC
  Administered 2022-01-25: 40000 [IU] via SUBCUTANEOUS
  Filled 2022-01-25: qty 1

## 2022-01-25 MED ORDER — CYANOCOBALAMIN 1000 MCG/ML IJ SOLN
1000.0000 ug | Freq: Once | INTRAMUSCULAR | Status: AC
  Administered 2022-01-25: 1000 ug via INTRAMUSCULAR
  Filled 2022-01-25: qty 1

## 2022-01-25 NOTE — Progress Notes (Signed)
Patient Care Team: Jinny Sanders, MD as PCP - General (Family Medicine) [name], MD as PCP - Cardiology (Cardiology) Vickie Epley, MD as PCP - Electrophysiology (Cardiology) Lendon Colonel, NP as Nurse Practitioner (Cardiology) Charlton Haws, [facility] as Pharmacist (Pharmacist)  DIAGNOSIS:  Encounter Diagnosis  Name Primary?   Iron deficiency anemia, unspecified iron deficiency anemia type Yes     CHIEF COMPLIANT: Follow-up of  chronic anemia     INTERVAL HISTORY: Gregory [name] is a 72 y.o. with above-mentioned history of heparin-induced thrombocytopenia and chronic macrocytic anemia. He presents to the clinic today for follow-up. He reports has problems with circulation in his feet.   ALLERGIES:  is allergic to heparin.  MEDICATIONS:  Current Outpatient Medications  Medication Sig Dispense Refill   acetaminophen (TYLENOL) 500 MG tablet Take 1,000 mg by mouth every 6 (six) hours as needed for moderate pain.     allopurinol (ZYLOPRIM) 100 MG tablet Take 1 tablet by mouth once daily 90 tablet 1   ALPRAZolam (XANAX) 0.5 MG tablet TAKE 1 TABLET BY MOUTH ONCE DAILY AS  NEEDED  FOR  ANXIETY  TAKE  30  MINUTES  PRIOR  TO  PLANE FLIGHT OR AS NEEDED FOR ANXIETY 20 tablet 0   aspirin EC 81 MG tablet Take 1 tablet (81 mg total) by mouth daily. Swallow whole. 30 tablet 11   atorvastatin (LIPITOR) 80 MG tablet Take 1 tablet by mouth once daily 90 tablet 3   Biotin 10 MG CAPS Take by mouth.     colchicine 0.6 MG tablet Take 2 tablets by mouth once daily 60 tablet 2   FLUAD QUADRIVALENT 0.5 ML injection Inject 0.5 mLs into the muscle.     furosemide (LASIX) 20 MG tablet TAKE 2 TABLETS BY MOUTH ONCE DAILY IN THE MORNING AND 1 TAB ONCE DAILY IN THE AFTERNOON 180 tablet 2   ketoconazole (NIZORAL) 2 % shampoo Apply 1 application topically 2 (two) times a week. 120 mL 1   latanoprost (XALATAN) 0.005 % ophthalmic solution Place 1 drop into both eyes every morning.       metoprolol succinate (TOPROL-XL) 25 MG 24 hr tablet Take 1 tablet (25 mg total) by mouth 2 (two) times daily. 180 tablet 3   Misc Natural Products (OSTEO BI-FLEX/5-LOXIN ADVANCED) TABS Take 2 tablets by mouth daily.     potassium chloride SA (KLOR-CON M20) 20 MEQ tablet Take 1 tablet by mouth once daily 90 tablet 3   sacubitril-valsartan (ENTRESTO) 97-103 MG Take 1 tablet by mouth 2 (two) times daily. 60 tablet 11   sertraline (ZOLOFT) 50 MG tablet Take 1 tablet by mouth once daily 90 tablet 0   spironolactone (ALDACTONE) 25 MG tablet Take 1 tablet by mouth once daily 90 tablet 1   traMADol (ULTRAM) 50 MG tablet TAKE 1 TABLET BY MOUTH EVERY 4 HOURS AS NEEDED FOR PAIN 30 tablet 0   traZODone (DESYREL) 50 MG tablet Take 0.5-1 tablets (25-50 mg total) by mouth at bedtime as needed for sleep. 90 tablet 1   No current facility-administered medications for this visit.    PHYSICAL EXAMINATION: ECOG PERFORMANCE STATUS: 1 - Symptomatic but completely ambulatory  Vitals:   01/25/22 0855  BP: (!) 142/70  Pulse: 71  Resp: 14  Temp: 97.7 F (36.5 C)  SpO2: 92%   Filed Weights   01/25/22 0855  Weight: 236 lb 9.6 oz (107.3 kg)     LABORATORY DATA:  I have reviewed the data  as listed    Latest Ref Rng & Units 01/11/2022    7:58 AM 09/12/2021    9:18 AM 06/20/2021    8:12 AM  CMP  Glucose 70 - 99 mg/dL 90  117  114   BUN 8 - 23 mg/dL '18  20  20   '$ Creatinine 0.61 - 1.24 mg/dL 0.79  0.98  0.85   Sodium 135 - 145 mmol/L 141  139  142   Potassium 3.5 - 5.1 mmol/L 4.4  4.6  4.1   Chloride 98 - 111 mmol/L 106  104  108   CO2 22 - 32 mmol/L '30  30  29   '$ Calcium 8.9 - 10.3 mg/dL 9.4  9.1  8.9   Total Protein 6.5 - 8.1 g/dL 6.7  6.7  6.5   Total Bilirubin 0.3 - 1.2 mg/dL 1.4  1.8  1.4   Alkaline Phos 38 - 126 U/L 39  36  41   AST 15 - 41 U/L 50  63  45   ALT 0 - 44 U/L '19  22  16     '$ Lab Results  Component Value Date   WBC 4.5 01/25/2022   HGB 10.5 (L) 01/25/2022   HCT 30.8 (L) 01/25/2022    MCV 98.7 01/25/2022   PLT 188 01/25/2022   NEUTROABS 2.8 01/25/2022    ASSESSMENT & PLAN:  Anemia of chronic disease TSH: 2.56: Normal CMP: AST 43, ALT 24, bilirubin 1.9, LDH 1004 (direct Coombs test negative) SPEP: No M protein Reticulocyte count: 3.8%, absolute reticulocyte count 122, reticulocyte hemoglobin: 23.9 (suggestive of iron deficiency or ineffective erythropoiesis) W10: 932, folic acid 35.5 Erythropoietin: 235.3 09/16/2020: Iron studies revealed ferritin of 14 and iron saturation of 5.3% (IV iron treatment)   Elevated LDH with normal direct Coombs test: Concern for intravascular hemolysis possibly from severe valvular heart disease   11/17/2020: Hemoglobin 9.6, WBC 3.3, platelets 142, ANC 1.9, ferritin 158, iron saturation 31% (improv from 5%) 01/13/21: Hb 9.9, WBC: 5.5, MCV 105.6, Platelet 165, TB 1.3, Ferritin 50, Iron Sat: 25%,  01/16/2021: Hemoglobin 9.2, MCV 105.1, B12 117 (start B12 injections) 02/13/2021: Hemoglobin 10, MCV 103.1,   06/20/2021: Hemoglobin 10, MCV 100.3  09/12/2021: Hemoglobin 11.1 01/02/2022: Hemoglobin 10.8 (Retacrit injection started) 01/25/2022: Hemoglobin 10.5  Knee replacement surgery has been postponed Continue every 3 week B12 and Retacrit injections      No orders of the defined types were placed in this encounter.  The patient has a good understanding of the overall plan. he agrees with it. he will call with any problems that may develop before the next visit here. Total time spent: 30 mins including face to face time and time spent for planning, charting and co-ordination of care   Harriette Ohara, MD 01/25/22    I Gardiner Coins am acting as a Education administrator for [employer]  I have reviewed the above documentation for accuracy and completeness, and I agree with the above.

## 2022-01-29 ENCOUNTER — Other Ambulatory Visit: Payer: Self-pay | Admitting: Family Medicine

## 2022-01-30 ENCOUNTER — Other Ambulatory Visit: Payer: Self-pay | Admitting: Cardiology

## 2022-01-30 NOTE — Telephone Encounter (Signed)
Last office visit 11/29/21 for Pre-Op Clearance.  Last refilled 11/01/21 for #30 with no refills.  No future appointments with PCP.

## 2022-01-31 ENCOUNTER — Other Ambulatory Visit: Payer: Self-pay | Admitting: Family Medicine

## 2022-01-31 DIAGNOSIS — H40013 Open angle with borderline findings, low risk, bilateral: Secondary | ICD-10-CM | POA: Diagnosis not present

## 2022-01-31 DIAGNOSIS — F411 Generalized anxiety disorder: Secondary | ICD-10-CM

## 2022-02-01 ENCOUNTER — Ambulatory Visit: Payer: Medicare HMO | Admitting: Vascular Surgery

## 2022-02-01 ENCOUNTER — Telehealth: Payer: Self-pay | Admitting: Hematology and Oncology

## 2022-02-01 ENCOUNTER — Ambulatory Visit (HOSPITAL_COMMUNITY)
Admission: RE | Admit: 2022-02-01 | Discharge: 2022-02-01 | Disposition: A | Discharge status: Home / Self Care | Payer: Medicare HMO | Source: Ambulatory Visit | Attending: Vascular Surgery | Admitting: Vascular Surgery

## 2022-02-01 ENCOUNTER — Ambulatory Visit: Admit: 2022-02-01 | Payer: Medicare HMO | Admitting: Orthopedic Surgery

## 2022-02-01 ENCOUNTER — Encounter: Payer: Self-pay | Admitting: Vascular Surgery

## 2022-02-01 VITALS — BP 125/76 | HR 73 | Temp 98.1°F | Resp 20 | Ht 66.0 in | Wt 237.0 lb

## 2022-02-01 DIAGNOSIS — I872 Venous insufficiency (chronic) (peripheral): Secondary | ICD-10-CM | POA: Diagnosis not present

## 2022-02-01 DIAGNOSIS — I739 Peripheral vascular disease, unspecified: Secondary | ICD-10-CM

## 2022-02-01 LAB — VAS US ABI WITH/WO TBI

## 2022-02-01 SURGERY — ARTHROPLASTY, KNEE, TOTAL, USING IMAGELESS COMPUTER-ASSISTED NAVIGATION
Anesthesia: Choice | Site: Knee | Laterality: Left

## 2022-02-01 NOTE — Telephone Encounter (Signed)
Rescheduled appointment per los. Patient is aware of the changes made to her upcoming appointments.  

## 2022-02-01 NOTE — Progress Notes (Signed)
REASON FOR VISIT:   Preoperative vascular clearance prior to knee replacement.  MEDICAL ISSUES:   PERIPHERAL ARTERIAL DISEASE: The patient has normal Doppler signals in both feet and his toe pressures suggest adequate circulation for healing a knee replacement bilaterally.  He does have some underlying chronic venous insufficiency and will need aggressive DVT prophylaxis.  He does have a history of heparin-induced thrombocytopenia.  I would aggressively elevate his legs and use compression therapy postoperatively.  I be happy to see him back at any time if any new vascular issues arise.  The patient is cleared from a vascular standpoint for his knee replacement surgery.  HPI:   Gregory [name] is a pleasant 72 y.o. male who I last saw on 07/27/2020 to evaluate prior to knee replacement.  The patient is undergone previous coronary revascularization and aortic valve replacement.  He has a history of heparin-induced thrombocytopenia.  When I saw him last he denied any history of claudication or rest pain.  He was considering having his left knee replaced, and then subsequently consider in the right knee.  He denied any previous history of DVT.  At the time of his last visit his ABI in the right was 100% with a toe pressure of 125 mmHg.  On the left ABI was 100% with a toe pressure of 136 mmHg.  At that time I did not see any contraindication to knee replacement.  He did have evidence of chronic venous insufficiency and would certainly need aggressive DVT prophylaxis.  We were asked to clear him recently and given that I had not seen him for a year and a half I recommended a follow-up visit.  My history, he denies any history of claudication, rest pain, or nonhealing ulcers.  He does have a history of lymphedema in the past.  He had no previous history of DVT.  He does have a history of heparin-induced thrombocytopenia.  Past Medical History:  Diagnosis Date   Alcohol abuse    Anxiety     Aortic valve disease    Arthritis    Coronary artery disease    Glaucoma    Hypertension    OSA (obstructive sleep apnea)    Pre-diabetes    S/P balloon aortic valvuloplasty 10/18/2020   done for bioprosthetic aortic valve replacement perivalvular leaking reducing it from severe to mild.   Shingles     Family History  Adopted: Yes  Problem Relation Age of Onset   Cancer Mother        breast cancer   Alcohol abuse Father    Breast cancer Sister     SOCIAL HISTORY: Social History   Tobacco Use   Smoking status: Never   Smokeless tobacco: Never  Substance Use Topics   Alcohol use: Yes    Alcohol/week: 12.0 standard drinks of alcohol    Types: 12 Cans of beer per week    Allergies  Allergen Reactions   Heparin     HIT antibody and SRA positive    Current Outpatient Medications  Medication Sig Dispense Refill   acetaminophen (TYLENOL) 500 MG tablet Take 1,000 mg by mouth every 6 (six) hours as needed for moderate pain.     allopurinol (ZYLOPRIM) 100 MG tablet Take 1 tablet by mouth once daily 90 tablet 1   ALPRAZolam (XANAX) 0.5 MG tablet TAKE 1 TABLET BY MOUTH ONCE DAILY AS  NEEDED  FOR  ANXIETY  TAKE  30  MINUTES  PRIOR  TO  PLANE  FLIGHT OR AS NEEDED FOR ANXIETY 20 tablet 0   aspirin EC 81 MG tablet Take 1 tablet (81 mg total) by mouth daily. Swallow whole. 30 tablet 11   atorvastatin (LIPITOR) 80 MG tablet Take 1 tablet by mouth once daily 90 tablet 3   Biotin 10 MG CAPS Take by mouth.     colchicine 0.6 MG tablet Take 2 tablets by mouth once daily 60 tablet 2   FLUAD QUADRIVALENT 0.5 ML injection Inject 0.5 mLs into the muscle.     furosemide (LASIX) 20 MG tablet TAKE TWO TABLETS BY MOUTH IN THE MORNING AND TAKE ONE TABLET BY MOUTH IN THE AFTERNOON 270 tablet 3   ketoconazole (NIZORAL) 2 % shampoo Apply 1 application topically 2 (two) times a week. 120 mL 1   latanoprost (XALATAN) 0.005 % ophthalmic solution Place 1 drop into both eyes every morning.      metoprolol  succinate (TOPROL-XL) 25 MG 24 hr tablet Take 1 tablet (25 mg total) by mouth 2 (two) times daily. 180 tablet 3   Misc Natural Products (OSTEO BI-FLEX/5-LOXIN ADVANCED) TABS Take 2 tablets by mouth daily.     potassium chloride SA (KLOR-CON M20) 20 MEQ tablet Take 1 tablet by mouth once daily 90 tablet 3   sacubitril-valsartan (ENTRESTO) 97-103 MG Take 1 tablet by mouth 2 (two) times daily. 60 tablet 11   sertraline (ZOLOFT) 50 MG tablet Take 1 tablet by mouth once daily 90 tablet 0   spironolactone (ALDACTONE) 25 MG tablet Take 1 tablet by mouth once daily 90 tablet 1   traMADol (ULTRAM) 50 MG tablet TAKE 1 TABLET BY MOUTH EVERY 4 HOURS AS NEEDED FOR PAIN 30 tablet 0   traZODone (DESYREL) 50 MG tablet Take 0.5-1 tablets (25-50 mg total) by mouth at bedtime as needed for sleep. 90 tablet 1   No current facility-administered medications for this visit.    REVIEW OF SYSTEMS:  '[X]'$  denotes positive finding, '[ ]'$  denotes negative finding Cardiac  Comments:  Chest pain or chest pressure:    Shortness of breath upon exertion:    Short of breath when lying flat:    Irregular heart rhythm:        Vascular    Pain in calf, thigh, or hip brought on by ambulation:    Pain in feet at night that wakes you up from your sleep:     Blood clot in your veins:    Leg swelling:  x       Pulmonary    Oxygen at home:    Productive cough:     Wheezing:         Neurologic    Sudden weakness in arms or legs:     Sudden numbness in arms or legs:     Sudden onset of difficulty speaking or slurred speech:    Temporary loss of vision in one eye:     Problems with dizziness:         Gastrointestinal    Blood in stool:     Vomited blood:         Genitourinary    Burning when urinating:     Blood in urine:        Psychiatric    Major depression:         Hematologic    Bleeding problems:    Problems with blood clotting too easily:        Skin    Rashes or ulcers:  Constitutional    Fever  or chills:     PHYSICAL EXAM:   Vitals:   02/01/22 1332  BP: 125/76  Pulse: 73  Resp: 20  Temp: 98.1 F (36.7 C)  SpO2: 93%  Weight: 237 lb (107.5 kg)  Height: '5\' 6"'$  (1.676 m)    GENERAL: The patient is a well-nourished male, in no acute distress. The vital signs are documented above. CARDIAC: There is a regular rate and rhythm.  VASCULAR: I do not detect carotid bruits. On the right side he has a palpable femoral, popliteal, and dorsalis pedis pulse.  He has a biphasic dorsalis pedis and posterior tibial signal with the Doppler. On the left side he has a palpable femoral and popliteal pulse.  I cannot palpate pedal pulses.  He has a biphasic posterior tibial and dorsalis pedis signal with the Doppler. He has some hyperpigmentation consistent with chronic venous insufficiency. He has mild bilateral lower extremity swelling. PULMONARY: There is good air exchange bilaterally without wheezing or rales. ABDOMEN: Soft and non-tender with normal pitched bowel sounds.  MUSCULOSKELETAL: There are no major deformities or cyanosis. NEUROLOGIC: No focal weakness or paresthesias are detected. SKIN: There are no ulcers or rashes noted. PSYCHIATRIC: The patient has a normal affect.  DATA:    ARTERIAL DOPPLER STUDY: I have independently interpreted his arterial Doppler study today.  On the right side he has a triphasic posterior tibial and dorsalis pedis signal.  An ABI could not be obtained as the arteries are calcified.  His toe pressure is 62 mmHg.  On the left side he has a triphasic posterior tibial signal with a biphasic dorsalis pedis signal.  The arteries are calcified and ABI cannot be obtained.  Toe pressure 61 mmHg.  Deitra Mayo [facility] [phone]

## 2022-02-05 DIAGNOSIS — E291 Testicular hypofunction: Secondary | ICD-10-CM | POA: Diagnosis not present

## 2022-02-08 NOTE — Progress Notes (Signed)
Remote ICD transmission.   

## 2022-02-09 DIAGNOSIS — E291 Testicular hypofunction: Secondary | ICD-10-CM | POA: Diagnosis not present

## 2022-02-12 ENCOUNTER — Telehealth: Payer: Self-pay

## 2022-02-12 NOTE — Progress Notes (Signed)
Care Management & Coordination Services Pharmacy Team  Reason for Encounter: Appointment Reminder  Contacted patient to confirm telephone appointment with [name], PharmD on 02/15/2022 at 3:00.  Spoke with patient on 02/12/2022   Do you have any problems getting your medications? No  What is your top health concern you would like to discuss at your upcoming visit? Patient does not have any concerns and is doing well.   Have you seen any other providers since your last visit with PCP? Yes  Star Rating Drugs:  Medication:    Last Fill: Day Supply Atorvastatin 80 mg   01/07/2022 90 Sacubitril-Valsartan 97-103 mg 02/04/2022 30  Care Gaps: Annual wellness visit in last year? Yes 12/22/2021  [name], PharmD notified  Marijean Niemann, [place] Pharmacy Assistant [phone]

## 2022-02-13 ENCOUNTER — Other Ambulatory Visit: Payer: Self-pay | Admitting: *Deleted

## 2022-02-13 DIAGNOSIS — D509 Iron deficiency anemia, unspecified: Secondary | ICD-10-CM

## 2022-02-14 NOTE — Assessment & Plan Note (Signed)
SH: 2.56: Normal CMP: AST 43, ALT 24, bilirubin 1.9, LDH 1004 (direct Coombs test negative) SPEP: No M protein Reticulocyte count: 3.8%, absolute reticulocyte count 122, reticulocyte hemoglobin: 23.9 (suggestive of iron deficiency or ineffective erythropoiesis) 123456: 0000000, folic acid [id number] Erythropoietin: 235.3 09/16/2020: Iron studies revealed ferritin of 14 and iron saturation of 5.3% (IV iron treatment)   Elevated LDH with normal direct Coombs test: Concern for intravascular hemolysis possibly from severe valvular heart disease   11/17/2020: Hemoglobin 9.6, WBC 3.3, platelets 142, ANC 1.9, ferritin 158, iron saturation 31% (improv from 5%) 01/13/21: Hb 9.9, WBC: 5.5, MCV 105.6, Platelet 165, TB 1.3, Ferritin 50, Iron Sat: 25%,  01/16/2021: Hemoglobin 9.2, MCV 105.1, B12 117 (start B12 injections) 02/13/2021: Hemoglobin 10, MCV 103.1,   06/20/2021: Hemoglobin 10, MCV 100.3  09/12/2021: Hemoglobin 11.1 01/02/2022: Hemoglobin 10.8 (Retacrit injection started) 01/25/2022: Hemoglobin 10.5   Knee replacement surgery has been postponed Continue every 3 week B12 and Retacrit injections

## 2022-02-15 ENCOUNTER — Inpatient Hospital Stay (HOSPITAL_BASED_OUTPATIENT_CLINIC_OR_DEPARTMENT_OTHER): Payer: Medicare HMO | Admitting: Hematology and Oncology

## 2022-02-15 ENCOUNTER — Telehealth: Payer: Self-pay | Admitting: Pharmacist

## 2022-02-15 ENCOUNTER — Ambulatory Visit: Payer: Medicare HMO | Admitting: Pharmacist

## 2022-02-15 ENCOUNTER — Inpatient Hospital Stay: Payer: Medicare HMO | Attending: Hematology and Oncology

## 2022-02-15 ENCOUNTER — Other Ambulatory Visit: Payer: Self-pay

## 2022-02-15 ENCOUNTER — Inpatient Hospital Stay: Payer: Medicare HMO

## 2022-02-15 ENCOUNTER — Other Ambulatory Visit: Payer: Self-pay | Admitting: Hematology and Oncology

## 2022-02-15 VITALS — BP 127/69 | HR 72 | Temp 97.2°F | Resp 18 | Wt 233.0 lb

## 2022-02-15 DIAGNOSIS — D539 Nutritional anemia, unspecified: Secondary | ICD-10-CM | POA: Insufficient documentation

## 2022-02-15 DIAGNOSIS — D638 Anemia in other chronic diseases classified elsewhere: Secondary | ICD-10-CM | POA: Diagnosis not present

## 2022-02-15 DIAGNOSIS — D519 Vitamin B12 deficiency anemia, unspecified: Secondary | ICD-10-CM | POA: Insufficient documentation

## 2022-02-15 DIAGNOSIS — D509 Iron deficiency anemia, unspecified: Secondary | ICD-10-CM

## 2022-02-15 DIAGNOSIS — E538 Deficiency of other specified B group vitamins: Secondary | ICD-10-CM

## 2022-02-15 LAB — CBC WITH DIFFERENTIAL (CANCER CENTER ONLY)
Abs Immature Granulocytes: 0.01 10*3/uL (ref 0.00–0.07)
Basophils Absolute: 0 10*3/uL (ref 0.0–0.1)
Basophils Relative: 0 %
Eosinophils Absolute: 0.1 10*3/uL (ref 0.0–0.5)
Eosinophils Relative: 2 %
HCT: 32.6 % — ABNORMAL LOW (ref 39.0–52.0)
Hemoglobin: 10.7 g/dL — ABNORMAL LOW (ref 13.0–17.0)
Immature Granulocytes: 0 %
Lymphocytes Relative: 30 %
Lymphs Abs: 1.5 10*3/uL (ref 0.7–4.0)
MCH: 33.1 pg (ref 26.0–34.0)
MCHC: 32.8 g/dL (ref 30.0–36.0)
MCV: 100.9 fL — ABNORMAL HIGH (ref 80.0–100.0)
Monocytes Absolute: 0.5 10*3/uL (ref 0.1–1.0)
Monocytes Relative: 11 %
Neutro Abs: 2.8 10*3/uL (ref 1.7–7.7)
Neutrophils Relative %: 57 %
Platelet Count: 206 10*3/uL (ref 150–400)
RBC: 3.23 MIL/uL — ABNORMAL LOW (ref 4.22–5.81)
RDW: 14 % (ref 11.5–15.5)
WBC Count: 4.9 10*3/uL (ref 4.0–10.5)
nRBC: 0 % (ref 0.0–0.2)

## 2022-02-15 LAB — SAMPLE TO BLOOD BANK

## 2022-02-15 MED ORDER — EPOETIN ALFA 40000 UNIT/ML IJ SOLN
40000.0000 [IU] | Freq: Once | INTRAMUSCULAR | Status: AC
  Administered 2022-02-15: 40000 [IU] via SUBCUTANEOUS
  Filled 2022-02-15: qty 1

## 2022-02-15 MED ORDER — CYANOCOBALAMIN 1000 MCG/ML IJ SOLN
1000.0000 ug | Freq: Once | INTRAMUSCULAR | Status: AC
  Administered 2022-02-15: 1000 ug via INTRAMUSCULAR
  Filled 2022-02-15: qty 1

## 2022-02-15 NOTE — Patient Instructions (Signed)
Visit Information  Phone number for Pharmacist: [phone]  Thank you for meeting with me to discuss your medications! Below is a summary of what we talked about during the visit:   Recommendations/Changes made from today's visit: -Consulting with PCP for tramadol Rx -Enrolled pt in [employer] for [place] - copays will be $0 with copay card  Follow up plan: -Pharmacist follow up televisit scheduled for 6 months -Hematology appt 03/15/22   [name], PharmD, BCACP Clinical Pharmacist [facility] [phone]

## 2022-02-15 NOTE — Progress Notes (Signed)
Patient Care Team: Jinny Sanders, MD as PCP - General (Family Medicine) [name], MD as PCP - Cardiology (Cardiology) Vickie Epley, MD as PCP - Electrophysiology (Cardiology) Lendon Colonel, NP as Nurse Practitioner (Cardiology) Charlton Haws, [facility] as Pharmacist (Pharmacist)  DIAGNOSIS:  Encounter Diagnosis  Name Primary?   Iron deficiency anemia, unspecified iron deficiency anemia type Yes      CHIEF COMPLIANT:  Follow-up of  chronic anemia       INTERVAL HISTORY: Gregory [name] is a 72 y.o. with above-mentioned history of heparin-induced thrombocytopenia and chronic macrocytic anemia. He presents to the clinic today for follow-up. He reports that he is getting ready to have surgery. He have no other concerns to report to the clinic today.  He is being treated for anemia chronic disease with every 3-week erythropoietin stimulating agents.  Our goal is to see if he can improve his hemoglobin up to 13 which is the cutoff for him to undergo orthopedic surgeries.  He has been receiving it every 3 weeks and so far we have only seen mild improvement.  He tells me that he is going to start testosterone replacement therapy soon.  Surgery date has been set for March 26.   ALLERGIES:  is allergic to heparin.  MEDICATIONS:  Current Outpatient Medications  Medication Sig Dispense Refill   COMIRNATY syringe Inject 0.3 mLs into the muscle once.     JATENZO 237 MG CAPS Take 1 capsule by mouth 2 (two) times daily.     acetaminophen (TYLENOL) 500 MG tablet Take 1,000 mg by mouth every 6 (six) hours as needed for moderate pain.     allopurinol (ZYLOPRIM) 100 MG tablet Take 1 tablet by mouth once daily 90 tablet 1   ALPRAZolam (XANAX) 0.5 MG tablet TAKE 1 TABLET BY MOUTH ONCE DAILY AS  NEEDED  FOR  ANXIETY  TAKE  30  MINUTES  PRIOR  TO  PLANE FLIGHT OR AS NEEDED FOR ANXIETY 20 tablet 0   aspirin EC 81 MG tablet Take 1 tablet (81 mg total) by mouth daily. Swallow whole. 30  tablet 11   atorvastatin (LIPITOR) 80 MG tablet Take 1 tablet by mouth once daily 90 tablet 3   Biotin 10 MG CAPS Take by mouth.     colchicine 0.6 MG tablet Take 2 tablets by mouth once daily 60 tablet 2   FLUAD QUADRIVALENT 0.5 ML injection Inject 0.5 mLs into the muscle.     furosemide (LASIX) 20 MG tablet TAKE TWO TABLETS BY MOUTH IN THE MORNING AND TAKE ONE TABLET BY MOUTH IN THE AFTERNOON 270 tablet 3   ketoconazole (NIZORAL) 2 % shampoo Apply 1 application topically 2 (two) times a week. 120 mL 1   latanoprost (XALATAN) 0.005 % ophthalmic solution Place 1 drop into both eyes every morning.      metoprolol succinate (TOPROL-XL) 25 MG 24 hr tablet Take 1 tablet (25 mg total) by mouth 2 (two) times daily. 180 tablet 3   Misc Natural Products (OSTEO BI-FLEX/5-LOXIN ADVANCED) TABS Take 2 tablets by mouth daily.     potassium chloride SA (KLOR-CON M20) 20 MEQ tablet Take 1 tablet by mouth once daily 90 tablet 3   sacubitril-valsartan (ENTRESTO) 97-103 MG Take 1 tablet by mouth 2 (two) times daily. 60 tablet 11   sertraline (ZOLOFT) 50 MG tablet Take 1 tablet by mouth once daily 90 tablet 0   spironolactone (ALDACTONE) 25 MG tablet Take 1 tablet by mouth once  daily 90 tablet 1   Testosterone Cypionate 200 MG/ML SOLN INJECT 1ML INTRAMUSCULARLY EVERY WEEK     traMADol (ULTRAM) 50 MG tablet TAKE 1 TABLET BY MOUTH EVERY 4 HOURS AS NEEDED FOR PAIN 30 tablet 0   traZODone (DESYREL) 50 MG tablet Take 0.5-1 tablets (25-50 mg total) by mouth at bedtime as needed for sleep. 90 tablet 1   No current facility-administered medications for this visit.    PHYSICAL EXAMINATION: ECOG PERFORMANCE STATUS: 1 - Symptomatic but completely ambulatory  Vitals:   02/15/22 0905  BP: 127/69  Pulse: 72  Resp: 18  Temp: (!) 97.2 F (36.2 C)  SpO2: 98%   Filed Weights   02/15/22 0905  Weight: 233 lb (105.7 kg)     LABORATORY DATA:  I have reviewed the data as listed    Latest Ref Rng & Units 01/11/2022     7:58 AM 09/12/2021    9:18 AM 06/20/2021    8:12 AM  CMP  Glucose 70 - 99 mg/dL 90  117  114   BUN 8 - 23 mg/dL 18  20  20   $ Creatinine 0.61 - 1.24 mg/dL 0.79  0.98  0.85   Sodium 135 - 145 mmol/L 141  139  142   Potassium 3.5 - 5.1 mmol/L 4.4  4.6  4.1   Chloride 98 - 111 mmol/L 106  104  108   CO2 22 - 32 mmol/L 30  30  29   $ Calcium 8.9 - 10.3 mg/dL 9.4  9.1  8.9   Total Protein 6.5 - 8.1 g/dL 6.7  6.7  6.5   Total Bilirubin 0.3 - 1.2 mg/dL 1.4  1.8  1.4   Alkaline Phos 38 - 126 U/L 39  36  41   AST 15 - 41 U/L 50  63  45   ALT 0 - 44 U/L 19  22  16     $ Lab Results  Component Value Date   WBC 4.9 02/15/2022   HGB 10.7 (L) 02/15/2022   HCT 32.6 (L) 02/15/2022   MCV 100.9 (H) 02/15/2022   PLT 206 02/15/2022   NEUTROABS 2.8 02/15/2022    ASSESSMENT & PLAN:  Iron deficiency anemia SH: 2.56: Normal CMP: AST 43, ALT 24, bilirubin 1.9, LDH 1004 (direct Coombs test negative) SPEP: No M protein Reticulocyte count: 3.8%, absolute reticulocyte count 122, reticulocyte hemoglobin: 23.9 (suggestive of iron deficiency or ineffective erythropoiesis) 123456: 0000000, folic acid A999333 Erythropoietin: 235.3 09/16/2020: Iron studies revealed ferritin of 14 and iron saturation of 5.3% (IV iron treatment)   Elevated LDH with normal direct Coombs test: Concern for intravascular hemolysis possibly from severe valvular heart disease   11/17/2020: Hemoglobin 9.6, WBC 3.3, platelets 142, ANC 1.9, ferritin 158, iron saturation 31% (improv from 5%) 01/13/21: Hb 9.9, WBC: 5.5, MCV 105.6, Platelet 165, TB 1.3, Ferritin 50, Iron Sat: 25%,  01/16/2021: Hemoglobin 9.2, MCV 105.1, B12 117 (start B12 injections) 02/13/2021: Hemoglobin 10, MCV 103.1,   06/20/2021: Hemoglobin 10, MCV 100.3  09/12/2021: Hemoglobin 11.1 01/02/2022: Hemoglobin 10.8 (Retacrit injection started) 02/15/2022: Hemoglobin 10.7 (Retacrit changed to every 2 weeks)   Knee replacement surgery has been postponed until 03/27/2022 Patient starting  testosterone replacement therapy which may also help his hemoglobin.    I will see him back in 1 month.  Right before his planned surgery  No orders of the defined types were placed in this encounter.  The patient has a good understanding of the overall plan. he agrees with  it. he will call with any problems that may develop before the next visit here. Total time spent: 30 mins including face to face time and time spent for planning, charting and co-ordination of care   Harriette Ohara, MD 02/15/22    I Gardiner Coins am acting as a Education administrator for [employer]  I have reviewed the above documentation for accuracy and completeness, and I agree with the above.

## 2022-02-15 NOTE — Telephone Encounter (Signed)
Patient reports Gregory [name] copay is cost prohibitive. Enrolled him in [employer] for Cardiomyopathy.  [employer] grant approved 01/16/22 - 01/16/23 BIN: [id number] PCN: [id number] ID: [id number] GRP: [id number]  Will contact [facility] and provide copay card info for next refill.

## 2022-02-15 NOTE — Progress Notes (Signed)
Care Management & Coordination Services Pharmacy Note  02/15/2022 Name:  Gregory [name] MRN:  [id number] DOB:  09/10/1950  Summary: F/U visit -Pain: pt reports worsening pain lately (back and knee pain); he typically takes tramadol once daily but is needing it more often lately, he requests #90 pills per month -HF: pt reports Delene Loll is cost-prohibitive   Recommendations/Changes made from today's visit: -Consulting with PCP for tramadol Rx -Enrolled pt in [employer] for [place] - copays will be $0 with copay card  Follow up plan: -Pharmacist follow up televisit scheduled Dec 2024 -Hematology appt 03/15/22   Subjective: Gregory [name] is an 72 y.o. year old male who is a primary patient of Bedsole, Amy E, MD.  The care coordination team was consulted for assistance with disease management and care coordination needs.    Engaged with patient by telephone for follow up visit.  Recent office visits: 11/29/21 Dr Diona Browner OV: pre-op exam - moderate-high risk. Hx HIT.   Recent consult visits: 02/15/22 Dr Lindi Adie (Heme/onc): IDA - TKA postponed to 03/27/22. Starting testosterone.  02/01/22 Dr Scot Dock (Vascular): PAD - normal doppler, ok to undergo TKA. Will need aggressive DVT ppx.  01/25/22 Dr Lindi Adie (Heme/onc): IDA - B12 injection given.  12/01/21 PA Grandville Silos (Cardiology): s/p aortic valvuloplasty. Doing well. No changes. 11/15/21 Dr Quentin Ore (Cardiology): f/u HF- NYHA II. No changes. 11/03/21 Dr Percival Spanish (Cardiology): f/u CAD. No changes.  Hospital visits: None in previous 6 months   Objective:  Lab Results  Component Value Date   CREATININE 0.79 01/11/2022   BUN 18 01/11/2022   GFR 85.22 05/01/2021   EGFR 61 11/02/2020   GFRNONAA >60 01/11/2022   GFRAA >60 03/17/2019   NA 141 01/11/2022   K 4.4 01/11/2022   CALCIUM 9.4 01/11/2022   CO2 30 01/11/2022   GLUCOSE 90 01/11/2022    Lab Results  Component Value Date/Time   HGBA1C 4.6 04/24/2021 08:36 AM   HGBA1C  4.5 (L) 10/18/2020 03:32 AM   GFR 85.22 05/01/2021 08:07 AM   GFR 86.41 04/24/2021 08:36 AM    Last diabetic Eye exam:  Lab Results  Component Value Date/Time   HMDIABEYEEXA No Retinopathy 04/06/2021 12:00 AM    Last diabetic Foot exam:  Lab Results  Component Value Date/Time   HMDIABFOOTEX done 01/09/2018 12:00 AM     Lab Results  Component Value Date   CHOL 150 04/24/2021   HDL 77.90 04/24/2021   LDLCALC 53 04/24/2021   LDLDIRECT 151.0 01/05/2016   TRIG 94.0 04/24/2021   CHOLHDL 2 04/24/2021       Latest Ref Rng & Units 01/11/2022    7:58 AM 09/12/2021    9:18 AM 06/20/2021    8:12 AM  Hepatic Function  Total Protein 6.5 - 8.1 g/dL 6.7  6.7  6.5   Albumin 3.5 - 5.0 g/dL 4.4  4.4  4.2   AST 15 - 41 U/L 50  63  45   ALT 0 - 44 U/L 19  22  16   $ Alk Phosphatase 38 - 126 U/L 39  36  41   Total Bilirubin 0.3 - 1.2 mg/dL 1.4  1.8  1.4     Lab Results  Component Value Date/Time   TSH 2.90 05/01/2021 08:07 AM   TSH 1.620 09/28/2020 04:38 PM   TSH 2.560 08/14/2018 12:09 PM   FREET4 0.80 (L) 08/14/2018 12:09 PM       Latest Ref Rng & Units 02/15/2022    8:37 AM 01/25/2022  8:16 AM 01/11/2022    7:58 AM  CBC  WBC 4.0 - 10.5 K/uL 4.9  4.5  4.0   Hemoglobin 13.0 - 17.0 g/dL 10.7  10.5  11.1   Hematocrit 39.0 - 52.0 % 32.6  30.8  32.7   Platelets 150 - 400 K/uL 206  188  176    Iron/TIBC/Ferritin/ %Sat    Component Value Date/Time   IRON 104 01/02/2022 0801   TIBC 330 01/02/2022 0801   FERRITIN 46 01/02/2022 0801   IRONPCTSAT 32 01/02/2022 0801    Lab Results  Component Value Date/Time   VD25OH 44.25 04/24/2021 08:36 AM   VD25OH 41.22 03/21/2020 08:05 AM   VITAMINB12 482 09/12/2021 09:18 AM   VITAMINB12 528 06/20/2021 08:12 AM    Clinical ASCVD: Yes  The ASCVD Risk score (Arnett DK, et al., 2019) failed to calculate for the following reasons:   The patient has a prior MI or stroke diagnosis    CHA2DS2/VAS Stroke Risk Points     4 >= 2 Points: High Risk   1 - 1.99 Points: Medium Risk  0 Points: Low Risk    No Change     Points Metrics  1 Has Congestive Heart Failure:  Yes   1 Has Vascular Disease:  Yes   1 Has Hypertension:  Yes   1 Age:  41   0 Has Diabetes:  No   0 Had Stroke:  No  Had TIA:  No  Had Thromboembolism:  No   0 Male:  No         12/22/2021    8:22 AM 11/29/2021    8:20 AM 04/27/2021   11:23 AM  Depression screen PHQ 2/9  Decreased Interest 0 0 0  Down, Depressed, Hopeless 0 0 0  PHQ - 2 Score 0 0 0  Altered sleeping 0 0   Tired, decreased energy 0 0   Change in appetite 0 0   Feeling bad or failure about yourself  0 0   Trouble concentrating 0 0   Moving slowly or fidgety/restless 0 0   Suicidal thoughts 0 0   PHQ-9 Score 0 0   Difficult doing work/chores Not difficult at all Not difficult at all      Social History   Tobacco Use  Smoking Status Never  Smokeless Tobacco Never   BP Readings from Last 3 Encounters:  02/15/22 127/69  02/01/22 125/76  01/25/22 (!) 142/70   Pulse Readings from Last 3 Encounters:  02/15/22 72  02/01/22 73  01/25/22 71   Wt Readings from Last 3 Encounters:  02/15/22 233 lb (105.7 kg)  02/01/22 237 lb (107.5 kg)  01/25/22 236 lb 9.6 oz (107.3 kg)   BMI Readings from Last 3 Encounters:  02/15/22 37.61 kg/m  02/01/22 38.25 kg/m  01/25/22 38.19 kg/m    Allergies  Allergen Reactions   Heparin     HIT antibody and SRA positive    Medications Reviewed Today     Reviewed by Charlton Haws, [facility] (Pharmacist) on 02/15/22 at 1526  Med List Status: <None>   Medication Order Taking? Sig Documenting Provider Last Dose Status Informant  acetaminophen (TYLENOL) 500 MG tablet [id number] Yes Take 1,000 mg by mouth every 6 (six) hours as needed for moderate pain. [provider] Taking Active Self  allopurinol (ZYLOPRIM) 100 MG tablet [id number] Yes Take 1 tablet by mouth once daily Diona Browner, Amy E, MD Taking Active   ALPRAZolam (XANAX) 0.5 MG tablet  [id number]  Yes TAKE 1 TABLET BY MOUTH ONCE DAILY AS  NEEDED  FOR  ANXIETY  TAKE  30  MINUTES  PRIOR  TO  PLANE FLIGHT OR AS NEEDED FOR ANXIETY Bedsole, Amy E, MD Taking Active   aspirin EC 81 MG tablet [id number] Yes Take 1 tablet (81 mg total) by mouth daily. Swallow whole. [name], MD Taking Active Self  atorvastatin (LIPITOR) 80 MG tablet [id number] Yes Take 1 tablet by mouth once daily Bedsole, Amy E, MD Taking Active   Biotin 10 MG CAPS [id number] Yes Take by mouth. [provider] Taking Active   colchicine 0.6 MG tablet [id number] Yes Take 2 tablets by mouth once daily Bedsole, Amy E, MD Taking Active   furosemide (LASIX) 20 MG tablet [id number] Yes TAKE TWO TABLETS BY MOUTH IN THE MORNING AND TAKE ONE TABLET BY MOUTH IN THE Suszanne Finch, MD Taking Active   JATENZO 237 MG CAPS [id number] Yes Take 1 capsule by mouth 2 (two) times daily. [provider] Taking Active   ketoconazole (NIZORAL) 2 % shampoo [id number] Yes Apply 1 application topically 2 (two) times a week. Jinny Sanders, MD Taking Active   latanoprost (XALATAN) 0.005 % ophthalmic solution [id number] Yes Place 1 drop into both eyes every morning.  [provider] Taking Active Self  metoprolol succinate (TOPROL-XL) 25 MG 24 hr tablet [id number] Yes Take 1 tablet (25 mg total) by mouth 2 (two) times daily. [name], MD Taking Active   Misc Natural Products (OSTEO BI-FLEX/5-LOXIN ADVANCED) TABS [id number] Yes Take 2 tablets by mouth daily. [provider] Taking Active Self  potassium chloride SA (KLOR-CON M20) 20 MEQ tablet [id number] Yes Take 1 tablet by mouth once daily [name], MD Taking Active   sacubitril-valsartan (ENTRESTO) 97-103 MG [id number] Yes Take 1 tablet by mouth 2 (two) times daily. [name], MD Taking Active   sertraline (ZOLOFT) 50 MG tablet [id number] Yes Take 1 tablet by mouth once daily Bedsole, Amy E, MD Taking Active   spironolactone (ALDACTONE) 25  MG tablet [id number] Yes Take 1 tablet by mouth once daily [name], MD Taking Active   Testosterone Cypionate 200 MG/ML Bailey Mech [id number] Yes INJECT 1ML INTRAMUSCULARLY EVERY WEEK [provider] Taking Active   traMADol (ULTRAM) 50 MG tablet [id number] Yes TAKE 1 TABLET BY MOUTH EVERY 4 HOURS AS NEEDED FOR PAIN Bedsole, Amy E, MD Taking Active   traZODone (DESYREL) 50 MG tablet [id number] Yes Take 0.5-1 tablets (25-50 mg total) by mouth at bedtime as needed for sleep. Jinny Sanders, MD Taking Active             SDOH:  (Social Determinants of Health) assessments and interventions performed: No SDOH Interventions    Flowsheet Row Clinical Support from 12/22/2021 in [place] [facility] [place] Management from 11/01/2020 in [place] at [place] from 12/18/2019 in [place] at [place] Interventions Intervention Not Indicated -- --  Housing Interventions Intervention Not Indicated -- --  Transportation Interventions Intervention Not Indicated -- --  Utilities Interventions Intervention Not Indicated -- --  Depression Interventions/Treatment  -- -- [id number] Score <4 Follow-up Not Indicated  Financial Strain Interventions Intervention Not Indicated --  Delene Loll PAP] --  Physical Activity Interventions Intervention Not Indicated -- --  Stress Interventions Intervention Not Indicated -- --  Social Connections Interventions Intervention Not Indicated -- --  Medication Assistance:  Starr Sinclair grant approved 01/16/22 - 01/16/23 BIN: [id number] PCN: [id number] ID: [id number] GRP: [id number]    Medication Access: Within the past 30 days, how often has patient missed a dose of medication? 0 Is a pillbox or other method used to improve adherence? Yes  Factors that may affect medication adherence? financial need Are meds synced by current  pharmacy? No  Are meds delivered by current pharmacy? No  Does patient experience delays in picking up medications due to transportation concerns? No   [facility] Services Reviewed: Is patient disadvantaged to use [facility]?: No  Current Rx insurance plan: [employer] Name and location of Current pharmacy:  [facility] [address], [place] - [place] [address] Phone: [phone] Fax: [phone]  [facility] services reviewed with patient today?: No  Patient requests to transfer care to [facility] Pharmacy?: No  Reason patient declined to change pharmacies: Not mentioned at this visit  Compliance/Adherence/Medication fill history: Care Gaps: None  Star-Rating Drugs: Atorvastatin - PDC 100%   ASSESSMENT / PLAN  Hypertension / CHF (BP goal <130/80) -Controlled - per clinic BP readings; pt does not endorse excess swelling -Last ejection fraction: 60-65% (Date: 12/01/21) - previously 30-35% (10/2020) -HF type: HFimpEF (EF improved from <40% to > 40%) -NYHA Class: II (slight limitation of activity) -AHA HF Stage: C (Heart disease and symptoms present) -Current treatment: Entresto 97-103 mg BID - Appropriate, Effective, Safe, Not Accessible Metoprolol succinate 25 mg BID - Appropriate, Effective, Safe, Accessible Spironolactone 25 mg daily -Appropriate, Effective, Safe, Accessible Potassium chloride SA 20 mEq daily -Appropriate, Effective, Safe, Accessible Furosemide 20 mg - 2 tab AM, 1 tab PM -Appropriate, Effective, Safe, Accessible -Medications previously tried: none   -Educated on Daily salt intake goal < 2300 mg; Importance of home blood pressure monitoring; -Counseled to monitor BP at home periodically -Pt reports Entresto copay is cost-prohibitive; enrolled in [employer] for Cardiomyopathy -Recommend to continue current medication;   Hyperlipidemia/CAD: (LDL goal < 70) -Controlled - LDL 53 (04/2021) at goal -Hx CAD -  CABG/AVR 03/2019. S/p balloon valvuloplasty and [employer] CRT-D 10/2020 -Current treatment: Atorvastatin 80 mg daily -Appropriate, Effective, Safe, Accessible Aspirin 81 mg daily -Appropriate, Effective, Safe, Accessible -Medications previously tried: none  -Educated on Cholesterol goals;  -Recommended to continue current medication  Anemia (Goal: HBG > 13) -Uncontrolled - following with hematology -Hx HIT; receiving q3 week ESA; goal to improve HGB to 13 for knee surgery -He will be starting testosterone as well  Depression/Anxiety (Goal: Improve mood) -Controlled - per pt report -PHQ9: 0 (12/18/19) - minimal depression -GAD7: not on file -Current treatment: Sertraline 50 mg daily -Appropriate, Effective, Safe, Accessible Trazodone 50 mg - 1/2-1 tablet PRN sleep (rare use - causes bad dreams) -Appropriate, Effective, Safe, Accessible Alprazolam 0.5 mg - 1 tablet PRN daily (rare use) -Appropriate, Effective, Safe, Accessible -Medications previously tried/failed: none reported -Educated on Benefits of medication for symptom control -Recommended to continue current medication  Osteoarthritis (Goal: manage pain) -Not ideally controlled - pt reports worsening pain lately (back and knee); he typically takes tramadol only once a day and it wears off within a few hours he is undergoing pre-op evaluation for upcoming TKA -Current treatment  Tramadol 50 mg q4h PRN - Appropriate, Effective, Safe, Accessible Tylenol 500 mg - Appropriate, Effective, Safe, Accessible -Discussed it is reasonable to take tramadol more than once per day -Recommend to continue current medication  Health Maintenance -Vaccine gaps: Shingrix, covid booster -  Hx postoperative Afib following CABG -Hx gout, on allopurinol and colchicine   [name], PharmD, BCACP Clinical Pharmacist [facility] [phone]

## 2022-02-15 NOTE — Telephone Encounter (Signed)
Let patient know if he is interested in increasing the frequency of use of tramadol I will need him to make an in office visit to discuss in detail given this is a controlled substance.

## 2022-02-15 NOTE — Telephone Encounter (Signed)
Patient reports worsening pain over last several weeks (knee pain and back pain). He is awaiting knee replacement surgery end of March. In the past he has typically been taking tramadol 1 pill daily so the prescription for #30 per month was adequate. Now he is needing to take tramadol multiple times per day and requests increase to 90 pills per month.  Routing to PCP.   [facility] [address] [place] - [place] [address] Phone: [phone] Fax: [phone]

## 2022-02-16 NOTE — Telephone Encounter (Signed)
Mr. Merlan notified as instructed by telephone.   Appointment scheduled 02/20/2022 at 8: 40 am with Dr. Diona Browner.

## 2022-02-16 NOTE — Telephone Encounter (Cosign Needed)
Unsuccessful outreach to pharmacy.continuous phone ringing and no answer.    Avel Sensor, [place] Assistant [phone]

## 2022-02-20 ENCOUNTER — Encounter: Payer: Self-pay | Admitting: Family Medicine

## 2022-02-20 ENCOUNTER — Ambulatory Visit (INDEPENDENT_AMBULATORY_CARE_PROVIDER_SITE_OTHER): Payer: Medicare HMO | Admitting: Family Medicine

## 2022-02-20 VITALS — BP 114/60 | HR 81 | Temp 97.8°F | Ht 66.5 in | Wt 233.4 lb

## 2022-02-20 DIAGNOSIS — M1712 Unilateral primary osteoarthritis, left knee: Secondary | ICD-10-CM | POA: Diagnosis not present

## 2022-02-20 MED ORDER — TRAMADOL HCL 50 MG PO TABS: 50.0000 mg | ORAL_TABLET | Freq: Three times a day (TID) | ORAL | 0 refills | Status: DC | PRN

## 2022-02-20 NOTE — Progress Notes (Signed)
Patient ID: Gregory [name], male    DOB: 1950-12-30, 72 y.o.   MRN: [id number]  This visit was conducted in person.  BP 114/60   Pulse 81   Temp 97.8 F (36.6 C) (Temporal)   Ht 5' 6.5" (1.689 m)   Wt 233 lb 6 oz (105.9 kg)   SpO2 99%   BMI 37.10 kg/m    CC:  Chief Complaint  Patient presents with   Pain Management    Discuss increasing Tramadol dose    Subjective:   HPI: Gregory [name] is a 72 y.o. male presenting on 02/20/2022 for Pain Management (Discuss increasing Tramadol dose)  Reviewed recent office visit with [name], Pharmacist... Patient had reported worsening pain over the last several weeks in left knees and low back.  He is awaiting left  knee replacement surgery at the end of March 27 by Dr. Lyla Glassing.  Previously had been taking tramadol 1 pill daily but feels that he needs to use tramadol multiple times per day to control pain.  PDMP reviewed during this encounter.      Today he reports.. pain starts in left knee, buy the end of the day left hip and low back pain. No new weakness or numbness.  No recent falls.  No fever.  Walking with a cane.    In last few weeks he has been taking twice a day to three times daily. Tend to need addiitonal doses if more active.  Pain not keeping him up at night given uses trazodone for sleep.     Mild associated constipation... treats with prune and cooffee.  Relevant past medical, surgical, family and social history reviewed and updated as indicated. Interim medical history since our last visit reviewed. Allergies and medications reviewed and updated. Outpatient Medications Prior to Visit  Medication Sig Dispense Refill   acetaminophen (TYLENOL) 500 MG tablet Take 1,000 mg by mouth every 6 (six) hours as needed for moderate pain.     allopurinol (ZYLOPRIM) 100 MG tablet Take 1 tablet by mouth once daily 90 tablet 1   ALPRAZolam (XANAX) 0.5 MG tablet TAKE 1 TABLET BY MOUTH ONCE DAILY AS  NEEDED  FOR   ANXIETY  TAKE  30  MINUTES  PRIOR  TO  PLANE FLIGHT OR AS NEEDED FOR ANXIETY 20 tablet 0   aspirin EC 81 MG tablet Take 1 tablet (81 mg total) by mouth daily. Swallow whole. 30 tablet 11   atorvastatin (LIPITOR) 80 MG tablet Take 1 tablet by mouth once daily 90 tablet 3   colchicine 0.6 MG tablet Take 2 tablets by mouth once daily 60 tablet 2   furosemide (LASIX) 20 MG tablet TAKE TWO TABLETS BY MOUTH IN THE MORNING AND TAKE ONE TABLET BY MOUTH IN THE AFTERNOON 270 tablet 3   JATENZO 237 MG CAPS Take 1 capsule by mouth 2 (two) times daily.     ketoconazole (NIZORAL) 2 % shampoo Apply 1 application topically 2 (two) times a week. 120 mL 1   latanoprost (XALATAN) 0.005 % ophthalmic solution Place 1 drop into both eyes every morning.      metoprolol succinate (TOPROL-XL) 25 MG 24 hr tablet Take 1 tablet (25 mg total) by mouth 2 (two) times daily. 180 tablet 3   Misc Natural Products (OSTEO BI-FLEX/5-LOXIN ADVANCED) TABS Take 2 tablets by mouth daily.     potassium chloride SA (KLOR-CON M20) 20 MEQ tablet Take 1 tablet by mouth once daily 90 tablet 3  sacubitril-valsartan (ENTRESTO) 97-103 MG Take 1 tablet by mouth 2 (two) times daily. 60 tablet 11   sertraline (ZOLOFT) 50 MG tablet Take 1 tablet by mouth once daily 90 tablet 0   spironolactone (ALDACTONE) 25 MG tablet Take 1 tablet by mouth once daily 90 tablet 1   Testosterone Cypionate 200 MG/ML SOLN INJECT 1ML INTRAMUSCULARLY EVERY WEEK     traZODone (DESYREL) 50 MG tablet Take 0.5-1 tablets (25-50 mg total) by mouth at bedtime as needed for sleep. 90 tablet 1   Biotin 10 MG CAPS Take by mouth.     traMADol (ULTRAM) 50 MG tablet TAKE 1 TABLET BY MOUTH EVERY 4 HOURS AS NEEDED FOR PAIN 30 tablet 0   No facility-administered medications prior to visit.     Per HPI unless specifically indicated in ROS section below Review of Systems  Constitutional:  Negative for fatigue and fever.  HENT:  Negative for ear pain.   Eyes:  Negative for pain.   Respiratory:  Negative for cough and shortness of breath.   Cardiovascular:  Negative for chest pain, palpitations and leg swelling.  Gastrointestinal:  Negative for abdominal pain.  Genitourinary:  Negative for dysuria.  Musculoskeletal:  Positive for arthralgias, back pain and gait problem.  Neurological:  Negative for syncope, light-headedness and headaches.  Psychiatric/Behavioral:  Negative for dysphoric mood.    Objective:  BP 114/60   Pulse 81   Temp 97.8 F (36.6 C) (Temporal)   Ht 5' 6.5" (1.689 m)   Wt 233 lb 6 oz (105.9 kg)   SpO2 99%   BMI 37.10 kg/m   Wt Readings from Last 3 Encounters:  02/20/22 233 lb 6 oz (105.9 kg)  02/15/22 233 lb (105.7 kg)  02/01/22 237 lb (107.5 kg)      Physical Exam Constitutional:      Appearance: He is well-developed.  HENT:     Head: Normocephalic.     Right Ear: Hearing normal.     Left Ear: Hearing normal.     Nose: Nose normal.  Neck:     Thyroid: No thyroid mass or thyromegaly.     Vascular: No carotid bruit.     Trachea: Trachea normal.  Cardiovascular:     Rate and Rhythm: Normal rate and regular rhythm.     Pulses: Normal pulses.     Heart sounds: Heart sounds not distant. No murmur heard.    No friction rub. No gallop.     Comments: No peripheral edema Pulmonary:     Effort: Pulmonary effort is normal. No respiratory distress.     Breath sounds: Normal breath sounds.  Musculoskeletal:     Cervical back: Normal.     Thoracic back: Normal.     Lumbar back: No tenderness or bony tenderness. Decreased range of motion. Negative right straight leg raise test and negative left straight leg raise test.     Right hip: No tenderness or bony tenderness. Normal range of motion.     Left hip: Tenderness present. No bony tenderness. Normal range of motion.     Right knee: Bony tenderness and crepitus present. Decreased range of motion. Tenderness present.     Left knee: Bony tenderness and crepitus present. Decreased range of  motion. Tenderness present.  Skin:    General: Skin is warm and dry.     Findings: No rash.  Psychiatric:        Speech: Speech normal.        Behavior: Behavior normal.  Thought Content: Thought content normal.       Results for orders placed or performed in visit on 02/15/22  CBC with Differential (Cancer Center Only)  Result Value Ref Range   WBC Count 4.9 4.0 - 10.5 K/uL   RBC 3.23 (L) 4.22 - 5.81 MIL/uL   Hemoglobin 10.7 (L) 13.0 - 17.0 g/dL   HCT 32.6 (L) 39.0 - 52.0 %   MCV 100.9 (H) 80.0 - 100.0 fL   MCH 33.1 26.0 - 34.0 pg   MCHC 32.8 30.0 - 36.0 g/dL   RDW 14.0 11.5 - 15.5 %   Platelet Count 206 150 - 400 K/uL   nRBC 0.0 0.0 - 0.2 %   Neutrophils Relative % 57 %   Neutro Abs 2.8 1.7 - 7.7 K/uL   Lymphocytes Relative 30 %   Lymphs Abs 1.5 0.7 - 4.0 K/uL   Monocytes Relative 11 %   Monocytes Absolute 0.5 0.1 - 1.0 K/uL   Eosinophils Relative 2 %   Eosinophils Absolute 0.1 0.0 - 0.5 K/uL   Basophils Relative 0 %   Basophils Absolute 0.0 0.0 - 0.1 K/uL   Immature Granulocytes 0 %   Abs Immature Granulocytes 0.01 0.00 - 0.07 K/uL  Sample to Blood Bank  Result Value Ref Range   Blood Bank Specimen SAMPLE AVAILABLE FOR TESTING    Sample Expiration      02/18/2022,2359 Performed at [facility], [place] [address], [place], [place] [address]     Assessment and Plan  Primary osteoarthritis of left knee Assessment & Plan: Chronic, associated with antalgic gait which is resulting in left hip pain and low back pain.  PDMP reviewed without concerns. Has upcoming surgery in a month.  It is reasonable to increase tramadol to 3 times a day as needed for pain until surgery. Encouraged him to continue working on physical therapy before as well as after the total knee replacement on the left.   Other orders -     traMADol HCl; Take 1 tablet (50 mg total) by mouth every 8 (eight) hours as needed. for pain  Dispense: 90 tablet; Refill: 0    No  follow-ups on file.   [name], MD

## 2022-02-20 NOTE — Assessment & Plan Note (Signed)
Chronic, associated with antalgic gait which is resulting in left hip pain and low back pain.  PDMP reviewed without concerns. Has upcoming surgery in a month.  It is reasonable to increase tramadol to 3 times a day as needed for pain until surgery. Encouraged him to continue working on physical therapy before as well as after the total knee replacement on the left.

## 2022-02-22 NOTE — Progress Notes (Signed)
Contacted [facility], they have had computers down  and phone issues for several days. [place] information given for [employer]. Patient notified.  [name], PharmD notified  Avel Sensor, [place] Assistant [phone]

## 2022-02-28 ENCOUNTER — Other Ambulatory Visit: Payer: Self-pay | Admitting: *Deleted

## 2022-02-28 DIAGNOSIS — D509 Iron deficiency anemia, unspecified: Secondary | ICD-10-CM

## 2022-03-01 ENCOUNTER — Other Ambulatory Visit: Payer: Medicare HMO

## 2022-03-01 ENCOUNTER — Inpatient Hospital Stay: Payer: Medicare HMO

## 2022-03-01 ENCOUNTER — Other Ambulatory Visit: Payer: Self-pay

## 2022-03-01 ENCOUNTER — Ambulatory Visit: Payer: Medicare HMO | Admitting: Hematology and Oncology

## 2022-03-01 VITALS — BP 136/76 | HR 71 | Temp 98.2°F | Resp 16

## 2022-03-01 DIAGNOSIS — D519 Vitamin B12 deficiency anemia, unspecified: Secondary | ICD-10-CM | POA: Diagnosis not present

## 2022-03-01 DIAGNOSIS — D509 Iron deficiency anemia, unspecified: Secondary | ICD-10-CM

## 2022-03-01 DIAGNOSIS — D539 Nutritional anemia, unspecified: Secondary | ICD-10-CM | POA: Diagnosis not present

## 2022-03-01 DIAGNOSIS — D638 Anemia in other chronic diseases classified elsewhere: Secondary | ICD-10-CM | POA: Diagnosis not present

## 2022-03-01 DIAGNOSIS — E538 Deficiency of other specified B group vitamins: Secondary | ICD-10-CM

## 2022-03-01 LAB — CBC WITH DIFFERENTIAL (CANCER CENTER ONLY)
Abs Immature Granulocytes: 0.02 10*3/uL (ref 0.00–0.07)
Basophils Absolute: 0 10*3/uL (ref 0.0–0.1)
Basophils Relative: 1 %
Eosinophils Absolute: 0.1 10*3/uL (ref 0.0–0.5)
Eosinophils Relative: 2 %
HCT: 31.7 % — ABNORMAL LOW (ref 39.0–52.0)
Hemoglobin: 10.2 g/dL — ABNORMAL LOW (ref 13.0–17.0)
Immature Granulocytes: 1 %
Lymphocytes Relative: 32 %
Lymphs Abs: 1.4 10*3/uL (ref 0.7–4.0)
MCH: 32.9 pg (ref 26.0–34.0)
MCHC: 32.2 g/dL (ref 30.0–36.0)
MCV: 102.3 fL — ABNORMAL HIGH (ref 80.0–100.0)
Monocytes Absolute: 0.4 10*3/uL (ref 0.1–1.0)
Monocytes Relative: 9 %
Neutro Abs: 2.4 10*3/uL (ref 1.7–7.7)
Neutrophils Relative %: 55 %
Platelet Count: 208 10*3/uL (ref 150–400)
RBC: 3.1 MIL/uL — ABNORMAL LOW (ref 4.22–5.81)
RDW: 13.5 % (ref 11.5–15.5)
WBC Count: 4.4 10*3/uL (ref 4.0–10.5)
nRBC: 0 % (ref 0.0–0.2)

## 2022-03-01 LAB — SAMPLE TO BLOOD BANK

## 2022-03-01 MED ORDER — CYANOCOBALAMIN 1000 MCG/ML IJ SOLN
1000.0000 ug | Freq: Once | INTRAMUSCULAR | Status: AC
  Administered 2022-03-01: 1000 ug via INTRAMUSCULAR
  Filled 2022-03-01: qty 1

## 2022-03-01 MED ORDER — EPOETIN ALFA 40000 UNIT/ML IJ SOLN
40000.0000 [IU] | Freq: Once | INTRAMUSCULAR | Status: AC
  Administered 2022-03-01: 40000 [IU] via SUBCUTANEOUS
  Filled 2022-03-01: qty 1

## 2022-03-02 ENCOUNTER — Ambulatory Visit: Payer: Medicare HMO | Admitting: Hematology and Oncology

## 2022-03-02 ENCOUNTER — Other Ambulatory Visit: Payer: Medicare HMO

## 2022-03-05 ENCOUNTER — Other Ambulatory Visit: Payer: Self-pay

## 2022-03-05 ENCOUNTER — Encounter (HOSPITAL_COMMUNITY): Payer: Self-pay

## 2022-03-05 ENCOUNTER — Emergency Department (HOSPITAL_COMMUNITY)
Admission: EM | Admit: 2022-03-05 | Discharge: 2022-03-05 | Disposition: A | Discharge status: Home / Self Care | Payer: Medicare HMO | Attending: Emergency Medicine | Admitting: Emergency Medicine

## 2022-03-05 ENCOUNTER — Emergency Department (HOSPITAL_COMMUNITY): Payer: Medicare HMO

## 2022-03-05 DIAGNOSIS — S81811A Laceration without foreign body, right lower leg, initial encounter: Secondary | ICD-10-CM | POA: Insufficient documentation

## 2022-03-05 DIAGNOSIS — W208XXA Other cause of strike by thrown, projected or falling object, initial encounter: Secondary | ICD-10-CM | POA: Insufficient documentation

## 2022-03-05 DIAGNOSIS — Z7982 Long term (current) use of aspirin: Secondary | ICD-10-CM | POA: Insufficient documentation

## 2022-03-05 DIAGNOSIS — S81819A Laceration without foreign body, unspecified lower leg, initial encounter: Secondary | ICD-10-CM

## 2022-03-05 DIAGNOSIS — S82201A Unspecified fracture of shaft of right tibia, initial encounter for closed fracture: Secondary | ICD-10-CM | POA: Diagnosis not present

## 2022-03-05 DIAGNOSIS — Z79899 Other long term (current) drug therapy: Secondary | ICD-10-CM | POA: Diagnosis not present

## 2022-03-05 DIAGNOSIS — S8991XA Unspecified injury of right lower leg, initial encounter: Secondary | ICD-10-CM | POA: Diagnosis not present

## 2022-03-05 NOTE — ED Notes (Signed)
Steri-strips applied to pt wound on left lower leg

## 2022-03-05 NOTE — ED Provider Notes (Signed)
[facility] Provider Note   CSN: [id number] Arrival date & time: 03/05/22  [id number]     History  Chief Complaint  Patient presents with   Leg Injury    Gregory [name] is a 72 y.o. male.  72 yo M with a chief complaints of an injury to the right leg.  He tells me that a cinderblock fell on it.  He went to urgent care and there is some concern for exposed tendon and perhaps foreign bodies in the wound and so he was sent here for evaluation.  He thinks his tetanus is up-to-date.  Has been able to walk on it.        Home Medications Prior to Admission medications   Medication Sig Start Date End Date Taking? Authorizing Provider  acetaminophen (TYLENOL) 500 MG tablet Take 1,000 mg by mouth every 6 (six) hours as needed for moderate pain.    [provider]  allopurinol (ZYLOPRIM) 100 MG tablet Take 1 tablet by mouth once daily 11/26/21   Bedsole, Amy E, MD  ALPRAZolam (XANAX) 0.5 MG tablet TAKE 1 TABLET BY MOUTH ONCE DAILY AS  NEEDED  FOR  ANXIETY  TAKE  30  MINUTES  PRIOR  TO  PLANE FLIGHT OR AS NEEDED FOR ANXIETY 11/01/21   Bedsole, Amy E, MD  aspirin EC 81 MG tablet Take 1 tablet (81 mg total) by mouth daily. Swallow whole. 08/06/19   [name], MD  atorvastatin (LIPITOR) 80 MG tablet Take 1 tablet by mouth once daily 07/04/21   Diona Browner, Amy E, MD  colchicine 0.6 MG tablet Take 2 tablets by mouth once daily 10/17/21   Bedsole, Amy E, MD  furosemide (LASIX) 20 MG tablet TAKE TWO TABLETS BY MOUTH IN THE MORNING AND TAKE ONE TABLET BY MOUTH IN THE AFTERNOON 01/31/22   [name], MD  JATENZO 237 MG CAPS Take 1 capsule by mouth 2 (two) times daily. 02/09/22   [provider]  ketoconazole (NIZORAL) 2 % shampoo Apply 1 application topically 2 (two) times a week. 05/09/20   Bedsole, Amy E, MD  latanoprost (XALATAN) 0.005 % ophthalmic solution Place 1 drop into both eyes every morning.  10/25/18   [provider]   metoprolol succinate (TOPROL-XL) 25 MG 24 hr tablet Take 1 tablet (25 mg total) by mouth 2 (two) times daily. 01/05/21   [name], MD  Misc Natural Products (OSTEO BI-FLEX/5-LOXIN ADVANCED) TABS Take 2 tablets by mouth daily.    [provider]  potassium chloride SA (KLOR-CON M20) 20 MEQ tablet Take 1 tablet by mouth once daily 12/12/21   [name], MD  sacubitril-valsartan (ENTRESTO) 97-103 MG Take 1 tablet by mouth 2 (two) times daily. 04/21/21   [name], MD  sertraline (ZOLOFT) 50 MG tablet Take 1 tablet by mouth once daily 01/14/22   Diona Browner, Amy E, MD  spironolactone (ALDACTONE) 25 MG tablet Take 1 tablet by mouth once daily 10/11/21   [name], MD  Testosterone Cypionate 200 MG/ML SOLN INJECT 1ML INTRAMUSCULARLY EVERY WEEK    [provider]  traMADol (ULTRAM) 50 MG tablet Take 1 tablet (50 mg total) by mouth every 8 (eight) hours as needed. for pain 02/20/22   Jinny Sanders, MD  traZODone (DESYREL) 50 MG tablet Take 0.5-1 tablets (25-50 mg total) by mouth at bedtime as needed for sleep. 05/01/21   Jinny Sanders, MD      Allergies    Heparin  Review of Systems   Review of Systems  Physical Exam Updated Vital Signs BP (!) 142/63   Pulse 70   Temp 98.4 F (36.9 C) (Oral)   Resp 18   Ht 5' 6.5" (1.689 m)   Wt 104.3 kg   SpO2 95%   BMI 36.57 kg/m  Physical Exam Vitals and nursing note reviewed.  Constitutional:      Appearance: He is well-developed.  HENT:     Head: Normocephalic and atraumatic.  Eyes:     Pupils: Pupils are equal, round, and reactive to light.  Neck:     Vascular: No JVD.  Cardiovascular:     Rate and Rhythm: Normal rate and regular rhythm.     Heart sounds: No murmur heard.    No friction rub. No gallop.  Pulmonary:     Effort: No respiratory distress.     Breath sounds: No wheezing.  Abdominal:     General: There is no distension.     Tenderness: There is no abdominal tenderness. There is no guarding  or rebound.  Musculoskeletal:        General: Normal range of motion.     Cervical back: Normal range of motion and neck supple.     Comments: Patient is a skin tear to the right lower extremity just lateral to the tibia.  No obvious bony tenderness.  There is some subcutaneous fat but no exposed muscle or tendon.  No appreciable foreign bodies.  Pulse motor and sensation intact distally.  Skin:    Coloration: Skin is not pale.     Findings: No rash.  Neurological:     Mental Status: He is alert and oriented to person, place, and time.  Psychiatric:        Behavior: Behavior normal.     ED Results / Procedures / Treatments   Labs (all labs ordered are listed, but only abnormal results are displayed) Labs Reviewed - No data to display  EKG None  Radiology DG Tibia/Fibula Right  Result Date: 03/05/2022 CLINICAL DATA:  Laceration EXAM: RIGHT TIBIA AND FIBULA - 2 VIEW COMPARISON:  None Available. FINDINGS: There is no evidence of fracture or other focal bone lesions. Peripheral vascular calcifications are present. No soft tissue foreign body identified. There are mild degenerative changes of the knee. IMPRESSION: 1. No acute osseous abnormality. No soft tissue foreign body. 2. Peripheral vascular calcifications. Electronically Signed   By: Ronney Asters M.D.   On: 03/05/2022 19:48    Procedures Procedures    Medications Ordered in ED Medications - No data to display  ED Course/ Medical Decision Making/ A&P                             Medical Decision Making Amount and/or Complexity of Data Reviewed Radiology: ordered.   72 yo M with a chief complaints of a cinderblock falling on his right leg.  He was seen in urgent care and there is some concern for exposed tendon and foreign bodies embedded into the wound.  I do not appreciate either of these on my exam.  The wound is just superficial to the skin, there is some exposed subcutaneous fat.  I do not see any muscle or tendons.   He has no weakness to the foot.  Has intact pulse motor and sensation.  Plain film of the right tib-fib without fracture, or foreign body on my independent interpretation.  Steri-Strip here.  PCP follow-up.  9:27 PM:  I have discussed the diagnosis/risks/treatment options with the patient.  Evaluation and diagnostic testing in the emergency department does not suggest an emergent condition requiring admission or immediate intervention beyond what has been performed at this time.  They will follow up with PCP. We also discussed returning to the ED immediately if new or worsening sx occur. We discussed the sx which are most concerning (e.g., sudden worsening pain, fever, inability to tolerate by mouth, redness, drainage) that necessitate immediate return. Medications administered to the patient during their visit and any new prescriptions provided to the patient are listed below.  Medications given during this visit Medications - No data to display   The patient appears reasonably screen and/or stabilized for discharge and I doubt any other medical condition or other [facility] requiring further screening, evaluation, or treatment in the ED at this time prior to discharge.          Final Clinical Impression(s) / ED Diagnoses Final diagnoses:  Skin tear of lower leg without complication, initial encounter    Rx / DC Orders ED Discharge Orders     None         Deno Etienne, DO 03/05/22 [id number]

## 2022-03-05 NOTE — Discharge Instructions (Signed)
Please return for redness drainage or if you develop a fever.  Please follow-up with your family doctor in the office.

## 2022-03-05 NOTE — ED Triage Notes (Signed)
Pt reports cinder block fell onto his right lower leg earlier today, went to UC and sent here. Pt takes baby ASA daily

## 2022-03-07 ENCOUNTER — Encounter: Payer: Self-pay | Admitting: Cardiology

## 2022-03-08 ENCOUNTER — Telehealth: Payer: Self-pay

## 2022-03-08 NOTE — Transitions of Care (Post Inpatient/ED Visit) (Signed)
   03/08/2022  Name: Gregory [name] MRN: [id number] DOB: October 26, 1950  Today's TOC FU Call Status: Today's TOC FU Call Status:: Successful TOC FU Call Competed TOC FU Call Complete Date: 03/08/22  Transition Care Management Follow-up Telephone Call Date of Discharge: 03/05/22 Discharge Facility: [facility]) Type of Discharge: Emergency Department Reason for ED Visit: Other: ("skin tear-lower leg") How have you been since you were released from the hospital?: Better (Patient states wound appears to be healing and looking better. He still has pain to area but managed with current regimen.) Any questions or concerns?: No  Items Reviewed: Did you receive and understand the discharge instructions provided?: No Medications obtained and verified?: No (patient declined) Any new allergies since your discharge?: No Dietary orders reviewed?: NA Do you have support at home?: Yes People in Home: spouse Name of Support/Comfort Primary Source: [name]  [place] and Equipment/Supplies: [place] Ordered?: NA Any new equipment or medical supplies ordered?: NA  Functional Questionnaire: Do you need assistance with bathing/showering or dressing?: No Do you need assistance with meal preparation?: No Do you need assistance with eating?: No Do you have difficulty maintaining continence: No Do you need assistance with getting out of bed/getting out of a chair/moving?: No Do you have difficulty managing or taking your medications?: No  Folllow up appointments reviewed: PCP Follow-up appointment confirmed?: No (Patient aware that d/c instructions advised to follow up with PCP-he states he will call and make an appt-declined RN CM assistance with scheduling appt) [facility] Follow-up appointment confirmed?: NA Do you need transportation to your follow-up appointment?: No Do you understand care options if your condition(s) worsen?: Yes-patient verbalized  understanding  SDOH Interventions Today    Flowsheet Row Most Recent Value  SDOH Interventions   Food Insecurity Interventions Intervention Not Indicated  Transportation Interventions Intervention Not Indicated       TOC Interventions Today    Flowsheet Row Most Recent Value  TOC Interventions   TOC Interventions Discussed/Reviewed TOC Interventions Discussed, Post op wound/incision care, S/S of infection      Interventions Today    Flowsheet Row Most Recent Value  Education Interventions   Education Provided Provided Education  [pain mgmt,]  Provided Verbal Education On Nutrition, When to see the doctor, Medication  Pharmacy Interventions   Pharmacy Dicussed/Reviewed Pharmacy Topics Discussed, Medications and their functions  Safety Interventions   Safety Discussed/Reviewed Safety Discussed        Hetty Blend [facility] Care Management Care Management Community Coordinator Direct Phone: [phone] Toll Free: [phone] Fax: [phone]

## 2022-03-09 NOTE — Progress Notes (Signed)
Patient Care Team: Jinny Sanders, MD as PCP - General (Family Medicine) [name], MD as PCP - Cardiology (Cardiology) Vickie Epley, MD as PCP - Electrophysiology (Cardiology) Lendon Colonel, NP as Nurse Practitioner (Cardiology) Charlton Haws, [facility] as Pharmacist (Pharmacist)  DIAGNOSIS: No diagnosis found.  SUMMARY OF ONCOLOGIC HISTORY: Oncology History   No history exists.    CHIEF COMPLIANT:   INTERVAL HISTORY: Gregory [name] is a   ALLERGIES:  is allergic to heparin.  MEDICATIONS:  Current Outpatient Medications  Medication Sig Dispense Refill   acetaminophen (TYLENOL) 500 MG tablet Take 1,000 mg by mouth every 6 (six) hours as needed for moderate pain.     allopurinol (ZYLOPRIM) 100 MG tablet Take 1 tablet by mouth once daily 90 tablet 1   ALPRAZolam (XANAX) 0.5 MG tablet TAKE 1 TABLET BY MOUTH ONCE DAILY AS  NEEDED  FOR  ANXIETY  TAKE  30  MINUTES  PRIOR  TO  PLANE FLIGHT OR AS NEEDED FOR ANXIETY 20 tablet 0   aspirin EC 81 MG tablet Take 1 tablet (81 mg total) by mouth daily. Swallow whole. 30 tablet 11   atorvastatin (LIPITOR) 80 MG tablet Take 1 tablet by mouth once daily 90 tablet 3   colchicine 0.6 MG tablet Take 2 tablets by mouth once daily 60 tablet 2   furosemide (LASIX) 20 MG tablet TAKE TWO TABLETS BY MOUTH IN THE MORNING AND TAKE ONE TABLET BY MOUTH IN THE AFTERNOON 270 tablet 3   JATENZO 237 MG CAPS Take 1 capsule by mouth 2 (two) times daily.     ketoconazole (NIZORAL) 2 % shampoo Apply 1 application topically 2 (two) times a week. 120 mL 1   latanoprost (XALATAN) 0.005 % ophthalmic solution Place 1 drop into both eyes every morning.      metoprolol succinate (TOPROL-XL) 25 MG 24 hr tablet Take 1 tablet (25 mg total) by mouth 2 (two) times daily. 180 tablet 3   Misc Natural Products (OSTEO BI-FLEX/5-LOXIN ADVANCED) TABS Take 2 tablets by mouth daily.     potassium chloride SA (KLOR-CON M20) 20 MEQ tablet Take 1 tablet by mouth  once daily 90 tablet 3   sacubitril-valsartan (ENTRESTO) 97-103 MG Take 1 tablet by mouth 2 (two) times daily. 60 tablet 11   sertraline (ZOLOFT) 50 MG tablet Take 1 tablet by mouth once daily 90 tablet 0   spironolactone (ALDACTONE) 25 MG tablet Take 1 tablet by mouth once daily 90 tablet 1   Testosterone Cypionate 200 MG/ML SOLN INJECT 1ML INTRAMUSCULARLY EVERY WEEK     traMADol (ULTRAM) 50 MG tablet Take 1 tablet (50 mg total) by mouth every 8 (eight) hours as needed. for pain 90 tablet 0   traZODone (DESYREL) 50 MG tablet Take 0.5-1 tablets (25-50 mg total) by mouth at bedtime as needed for sleep. 90 tablet 1   No current facility-administered medications for this visit.    PHYSICAL EXAMINATION: ECOG PERFORMANCE STATUS: {CHL ONC ECOG [id number]:[phone]}  There were no vitals filed for this visit. There were no vitals filed for this visit.  BREAST:*** No palpable masses or nodules in either right or left breasts. No palpable axillary supraclavicular or infraclavicular adenopathy no breast tenderness or nipple discharge. (exam performed in the presence of a chaperone)  LABORATORY DATA:  I have reviewed the data as listed    Latest Ref Rng & Units 01/11/2022    7:58 AM 09/12/2021    9:18 AM 06/20/2021  8:12 AM  CMP  Glucose 70 - 99 mg/dL 90  117  114   BUN 8 - 23 mg/dL '18  20  20   '$ Creatinine 0.61 - 1.24 mg/dL 0.79  0.98  0.85   Sodium 135 - 145 mmol/L 141  139  142   Potassium 3.5 - 5.1 mmol/L 4.4  4.6  4.1   Chloride 98 - 111 mmol/L 106  104  108   CO2 22 - 32 mmol/L '30  30  29   '$ Calcium 8.9 - 10.3 mg/dL 9.4  9.1  8.9   Total Protein 6.5 - 8.1 g/dL 6.7  6.7  6.5   Total Bilirubin 0.3 - 1.2 mg/dL 1.4  1.8  1.4   Alkaline Phos 38 - 126 U/L 39  36  41   AST 15 - 41 U/L 50  63  45   ALT 0 - 44 U/L '19  22  16     '$ Lab Results  Component Value Date   WBC 4.4 03/01/2022   HGB 10.2 (L) 03/01/2022   HCT 31.7 (L) 03/01/2022   MCV 102.3 (H) 03/01/2022   PLT 208 03/01/2022    NEUTROABS 2.4 03/01/2022    ASSESSMENT & PLAN:  No problem-specific Assessment & Plan notes found for this encounter.    No orders of the defined types were placed in this encounter.  The patient has a good understanding of the overall plan. he agrees with it. he will call with any problems that may develop before the next visit here. Total time spent: 30 mins including face to face time and time spent for planning, charting and co-ordination of care   Suzzette Righter, [place] 03/09/22    I Gardiner Coins am acting as a Education administrator for [employer]  ***

## 2022-03-09 NOTE — Progress Notes (Signed)
Fax received from [facility] for medical clearance/medication hold for left total knee arthroplasty to be signed by Dr. Scot Dock.  Provider signed, form faxed back to sender, verified successful, sent to scan center.

## 2022-03-12 ENCOUNTER — Ambulatory Visit: Payer: Self-pay

## 2022-03-12 NOTE — Progress Notes (Signed)
Sent message, via epic in basket, requesting orders in epic from surgeon.  

## 2022-03-12 NOTE — Chronic Care Management (AMB) (Signed)
   03/12/2022  Gregory [name] May 23, 1950 [id number]   Reason for Encounter: Change in CCM enrollment status   [place] Manager/Chronic Care Management [phone]

## 2022-03-13 ENCOUNTER — Ambulatory Visit: Payer: Self-pay | Admitting: Student

## 2022-03-14 ENCOUNTER — Ambulatory Visit (INDEPENDENT_AMBULATORY_CARE_PROVIDER_SITE_OTHER): Payer: Medicare HMO | Admitting: Family Medicine

## 2022-03-14 ENCOUNTER — Encounter: Payer: Self-pay | Admitting: Family Medicine

## 2022-03-14 VITALS — BP 124/82 | HR 75 | Temp 98.2°F | Ht 66.5 in | Wt 237.0 lb

## 2022-03-14 DIAGNOSIS — S81812S Laceration without foreign body, left lower leg, sequela: Secondary | ICD-10-CM | POA: Diagnosis not present

## 2022-03-14 DIAGNOSIS — L03115 Cellulitis of right lower limb: Secondary | ICD-10-CM

## 2022-03-14 DIAGNOSIS — L02415 Cutaneous abscess of right lower limb: Secondary | ICD-10-CM | POA: Diagnosis not present

## 2022-03-14 MED ORDER — DOXYCYCLINE HYCLATE 100 MG PO TABS: 100.0000 mg | ORAL_TABLET | Freq: Two times a day (BID) | ORAL | 0 refills | Status: DC

## 2022-03-14 MED ORDER — TRAZODONE HCL 50 MG PO TABS: 25.0000 mg | ORAL_TABLET | Freq: Every evening | ORAL | 1 refills | Status: DC | PRN

## 2022-03-14 MED ORDER — CEFTRIAXONE SODIUM 1 G IJ SOLR
1.0000 g | Freq: Once | INTRAMUSCULAR | Status: AC
  Administered 2022-03-14: 1 g via INTRAMUSCULAR

## 2022-03-14 NOTE — Assessment & Plan Note (Addendum)
Acute, increasing pain and redness. Higher risk for infection given history of peripheral vascular disease, prediabetes and lymphedema. Will treat with Rocephin 1 g x 1 today in office and start him on doxycycline 100 mg p.o. twice daily x 10 days.  Close follow-up in 2 days for reevaluation and possible debridement of wound if not improving.  No evidence of sepsis or systemic infection. ER and return precautions provided.

## 2022-03-14 NOTE — Addendum Note (Signed)
Addended by: Pat Kocher on: 03/14/2022 09:39 AM   Modules accepted: Orders

## 2022-03-14 NOTE — Progress Notes (Addendum)
Anesthesia Review:  PCP: [name] LOV 02/20/22  Cardiologist :  DR  Percival Spanish  LOV 11/03/21 on chart Miguel Rota LOV 12/01/21  Lvmm ON 03/19/22 and requested cardiac clearance.   VAscular- LOV DR Gae Gallop 02/01/22 Clearance on chart 02/01/22  Chest x-ray : EKG : 11/15/21  BIv pacemaker - Device orders requested on 03/19/22. Device orders in chart and in epic on 03/19/22.  Device check- 01/18/22  Echo : [id number]  Stress test: Cardiac Cath :  Activity level:  Sleep Study/ CPAP : Fasting Blood Sugar :      / Checks Blood Sugar -- times a day:   Blood Thinner/ Instructions /Last Dose: ASA / Instructions/ Last Dose :    81 mg aspiirn    03/05/22- skin tear right leg  Hjgb on 03/15/22 was 9.9.  Pt has received iron transfusions in past and is on iron 3x per week.     3/4 24 - skin tear on right lower leg.   At preop with dsg on leg and still taking po antibiotics.  Asked pt if Dr Lyla Glassing aware.  .  PT and wife stated no.  Instructed to inform  Dr Lyla Glassing.     OR schedule states left leg,  PT states left leg at preop OR consent states right leg.  Called and LVMM for Marianna Fuss that consent form needed correction.  LVMMM   Prediabetes- does not check glucose at home  Hgba1c- 03/19/22- 4.8  CBC done 03/19/22 with hgb of 10.2 routed to Dr Lyla Glassing on 03/19/22.

## 2022-03-14 NOTE — Patient Instructions (Addendum)
Wash the wound daily with antibacterial soap, continue neosporin. Stop hydrogen peroxide.  Elevate foot when able.  Complete antibiotics.

## 2022-03-14 NOTE — Patient Instructions (Signed)
SURGICAL WAITING ROOM VISITATION  Patients having surgery or a procedure may have no more than 2 support people in the waiting area - these visitors may rotate.    Children under the age of 49 must have an adult with them who is not the patient.  Due to an increase in RSV and influenza rates and associated hospitalizations, children ages 56 and under may not visit patients in [place].  If the patient needs to stay at the hospital during part of their recovery, the visitor guidelines for inpatient rooms apply. Pre-op nurse will coordinate an appropriate time for 1 support person to accompany patient in pre-op.  This support person may not rotate.    Please refer to the [facility] website for the visitor guidelines for Inpatients (after your surgery is over and you are in a regular room).       Your procedure is scheduled on:  03/27/21    Report to [facility] Main Entrance    Report to admitting at   0730 AM   Call this number if you have problems the morning of surgery [phone]   Do not eat food :After Midnight.   After Midnight you may have the following liquids until __ 0700____ AM DAY OF SURGERY  Water Non-Citrus Juices (without pulp, NO RED-Apple, White grape, White cranberry) Black Coffee (NO MILK/CREAM OR CREAMERS, sugar ok)  Clear Tea (NO MILK/CREAM OR CREAMERS, sugar ok) regular and decaf                             Plain Jell-O (NO RED)                                           Fruit ices (not with fruit pulp, NO RED)                                     Popsicles (NO RED)                                                               Sports drinks like Gatorade (NO RED)                     The day of surgery:  Drink ONE (1) Pre-Surgery Clear Ensure or G2 at   0700 ( have completed by ) AM the morning of surgery. Drink in one sitting. Do not sip.  This drink was given to you during your hospital  pre-op appointment visit. Nothing else to  drink after completing the  Pre-Surgery Clear Ensure or G2.          If you have questions, please contact your surgeon's office.      Oral Hygiene is also important to reduce your risk of infection.                                    Remember - BRUSH YOUR TEETH THE MORNING OF SURGERY  WITH YOUR REGULAR TOOTHPASTE  DENTURES WILL BE REMOVED PRIOR TO SURGERY PLEASE DO NOT APPLY "Poly grip" OR ADHESIVES!!!   Do NOT smoke after Midnight   Take these medicines the morning of surgery with A SIP OF WATER:    DO NOT TAKE ANY ORAL DIABETIC MEDICATIONS DAY OF YOUR SURGERY  Bring CPAP mask and tubing day of surgery.                              You may not have any metal on your body including hair pins, jewelry, and body piercing             Do not wear make-up, lotions, powders, perfumes/cologne, or deodorant  Do not wear nail polish including gel and S&S, artificial/acrylic nails, or any other type of covering on natural nails including finger and toenails. If you have artificial nails, gel coating, etc. that needs to be removed by a nail salon please have this removed prior to surgery or surgery may need to be canceled/ delayed if the surgeon/ anesthesia feels like they are unable to be safely monitored.   Do not shave  48 hours prior to surgery.               Men may shave face and neck.   Do not bring valuables to the hospital. [place].   Contacts, glasses, dentures or bridgework may not be worn into surgery.   Bring small overnight bag day of surgery.   DO NOT [place]. PHARMACY WILL DISPENSE MEDICATIONS LISTED ON YOUR MEDICATION LIST TO YOU DURING YOUR ADMISSION [place]!    Patients discharged on the day of surgery will not be allowed to drive home.  Someone NEEDS to stay with you for the first 24 hours after anesthesia.   Special Instructions: Bring a copy of your healthcare power of  attorney and living will documents the day of surgery if you haven't scanned them before.              Please read over the following fact sheets you were given: IF [place] [phone]   If you received a COVID test during your pre-op visit  it is requested that you wear a mask when out in public, stay away from anyone that may not be feeling well and notify your surgeon if you develop symptoms. If you test positive for Covid or have been in contact with anyone that has tested positive in the last 10 days please notify you surgeon.    [place] - Preparing for Surgery Before surgery, you can play an important role.  Because skin is not sterile, your skin needs to be as free of germs as possible.  You can reduce the number of germs on your skin by washing with CHG (chlorahexidine gluconate) soap before surgery.  CHG is an antiseptic cleaner which kills germs and bonds with the skin to continue killing germs even after washing. Please DO NOT use if you have an allergy to CHG or antibacterial soaps.  If your skin becomes reddened/irritated stop using the CHG and inform your nurse when you arrive at Short Stay. Do not shave (including legs and underarms) for at least 48 hours prior to the first CHG shower.  You may shave your face/neck. Please follow these instructions carefully:  1.  Shower with CHG Soap the night before surgery and the  morning of Surgery.  2.  If you choose to wash your hair, wash your hair first as usual with your  normal  shampoo.  3.  After you shampoo, rinse your hair and body thoroughly to remove the  shampoo.                           4.  Use CHG as you would any other liquid soap.  You can apply chg directly  to the skin and wash                       Gently with a scrungie or clean washcloth.  5.  Apply the CHG Soap to your body ONLY FROM THE NECK DOWN.   Do not use on face/ open                           Wound or open  sores. Avoid contact with eyes, ears mouth and genitals (private parts).                       Wash face,  Genitals (private parts) with your normal soap.             6.  Wash thoroughly, paying special attention to the area where your surgery  will be performed.  7.  Thoroughly rinse your body with warm water from the neck down.  8.  DO NOT shower/wash with your normal soap after using and rinsing off  the CHG Soap.                9.  Pat yourself dry with a clean towel.            10.  Wear clean pajamas.            11.  Place clean sheets on your bed the night of your first shower and do not  sleep with pets. Day of Surgery : Do not apply any lotions/deodorants the morning of surgery.  Please wear clean clothes to the hospital/surgery center.  FAILURE TO FOLLOW THESE INSTRUCTIONS MAY RESULT IN THE CANCELLATION OF YOUR SURGERY PATIENT SIGNATURE_________________________________  NURSE SIGNATURE__________________________________  ________________________________________________________________________

## 2022-03-14 NOTE — Assessment & Plan Note (Signed)
Gregory [name], some concerns for placement of Steri-Strips as increasing risk for infection.  Will treat as per cellulitis section but if not improving in 48 hours consider removing Steri-Strips early and debriding dead skin from wound site.

## 2022-03-14 NOTE — Progress Notes (Signed)
Patient ID: Gregory [name], male    DOB: [birth year], 72 y.o.   MRN: [id number]  This visit was conducted in person.  BP 124/82   Pulse 75   Temp 98.2 F (36.8 C) (Temporal)   Ht 5' 6.5" (1.689 m)   Wt 237 lb (107.5 kg)   SpO2 98%   BMI 37.68 kg/m    CC:  Chief Complaint  Patient presents with   Follow-up    ED follow up Skin tear of the right lower leg  Patient states he has noticed some clear drainage, he is changing dressing daily. He soaks the wound in hydrogen peroxide every morning.     Subjective:   HPI: Gregory [name] is a 72 y.o. male presenting on 03/14/2022 for Follow-up (ED follow up/Skin tear of the right lower leg//Patient states he has noticed some clear drainage, he is changing dressing daily. He soaks the wound in hydrogen peroxide every morning. )  ER note reviewed from March 05, 2022 Injury to right lower leg when cinderblock fell on it. Skin tear was present just lateral to the tibia. Plain film of the tib-fib: Without fracture or foreign body. Steri-Strips placed.  Patient has been changing the dressing daily, cleaning the wound and hydroperoxide every morning. He has noted some clear drainage.  No fever, no flulike symptoms, has redness spreading, no odor of wound. But there is redness around site Pain is  not improving overtime.   Has history of bilateral lymphedema.. using compression hose twice daily.     Hx of PVD, prediabetes, lymphedema   Relevant past medical, surgical, family and social history reviewed and updated as indicated. Interim medical history since our last visit reviewed. Allergies and medications reviewed and updated. Outpatient Medications Prior to Visit  Medication Sig Dispense Refill   acetaminophen (TYLENOL) 500 MG tablet Take 1,000 mg by mouth every 6 (six) hours as needed for moderate pain.     allopurinol (ZYLOPRIM) 100 MG tablet Take 1 tablet by mouth once daily 90 tablet 1   ALPRAZolam (XANAX) 0.5 MG tablet  TAKE 1 TABLET BY MOUTH ONCE DAILY AS  NEEDED  FOR  ANXIETY  TAKE  30  MINUTES  PRIOR  TO  PLANE FLIGHT OR AS NEEDED FOR ANXIETY 20 tablet 0   aspirin EC 81 MG tablet Take 1 tablet (81 mg total) by mouth daily. Swallow whole. 30 tablet 11   atorvastatin (LIPITOR) 80 MG tablet Take 1 tablet by mouth once daily 90 tablet 3   colchicine 0.6 MG tablet Take 2 tablets by mouth once daily 60 tablet 2   furosemide (LASIX) 20 MG tablet TAKE TWO TABLETS BY MOUTH IN THE MORNING AND TAKE ONE TABLET BY MOUTH IN THE AFTERNOON 270 tablet 3   JATENZO 237 MG CAPS Take 1 capsule by mouth 2 (two) times daily.     ketoconazole (NIZORAL) 2 % shampoo Apply 1 application topically 2 (two) times a week. 120 mL 1   latanoprost (XALATAN) 0.005 % ophthalmic solution Place 1 drop into both eyes every morning.      metoprolol succinate (TOPROL-XL) 25 MG 24 hr tablet Take 1 tablet (25 mg total) by mouth 2 (two) times daily. 180 tablet 3   Misc Natural Products (OSTEO BI-FLEX/5-LOXIN ADVANCED) TABS Take 2 tablets by mouth daily.     potassium chloride SA (KLOR-CON M20) 20 MEQ tablet Take 1 tablet by mouth once daily 90 tablet 3   sacubitril-valsartan (ENTRESTO) 97-103 MG Take 1  tablet by mouth 2 (two) times daily. 60 tablet 11   sertraline (ZOLOFT) 50 MG tablet Take 1 tablet by mouth once daily 90 tablet 0   spironolactone (ALDACTONE) 25 MG tablet Take 1 tablet by mouth once daily 90 tablet 1   traMADol (ULTRAM) 50 MG tablet Take 1 tablet (50 mg total) by mouth every 8 (eight) hours as needed. for pain 90 tablet 0   traZODone (DESYREL) 50 MG tablet Take 0.5-1 tablets (25-50 mg total) by mouth at bedtime as needed for sleep. 90 tablet 1   Testosterone Cypionate 200 MG/ML SOLN INJECT 1ML INTRAMUSCULARLY EVERY WEEK     No facility-administered medications prior to visit.     Per HPI unless specifically indicated in ROS section below Review of Systems  Constitutional:  Negative for fatigue and fever.  HENT:  Negative for ear  pain.   Eyes:  Negative for pain.  Respiratory:  Negative for cough and shortness of breath.   Cardiovascular:  Positive for leg swelling. Negative for chest pain and palpitations.  Gastrointestinal:  Negative for abdominal pain.  Genitourinary:  Negative for dysuria.  Musculoskeletal:  Negative for arthralgias.  Neurological:  Negative for syncope, light-headedness and headaches.  Psychiatric/Behavioral:  Negative for dysphoric mood.    Objective:  BP 124/82   Pulse 75   Temp 98.2 F (36.8 C) (Temporal)   Ht 5' 6.5" (1.689 m)   Wt 237 lb (107.5 kg)   SpO2 98%   BMI 37.68 kg/m   Wt Readings from Last 3 Encounters:  03/14/22 237 lb (107.5 kg)  03/05/22 230 lb (104.3 kg)  02/20/22 233 lb 6 oz (105.9 kg)      Physical Exam Constitutional:      Appearance: He is well-developed.  HENT:     Head: Normocephalic.     Right Ear: Hearing normal.     Left Ear: Hearing normal.     Nose: Nose normal.  Neck:     Thyroid: No thyroid mass or thyromegaly.     Vascular: No carotid bruit.     Trachea: Trachea normal.  Cardiovascular:     Rate and Rhythm: Normal rate and regular rhythm.     Pulses: Normal pulses.     Heart sounds: Heart sounds not distant. No murmur heard.    No friction rub. No gallop.     Comments: No peripheral edema Pulmonary:     Effort: Pulmonary effort is normal. No respiratory distress.     Breath sounds: Normal breath sounds.  Skin:    General: Skin is warm and dry.     Findings: No rash.  Psychiatric:        Speech: Speech normal.        Behavior: Behavior normal.        Thought Content: Thought content normal.       Results for orders placed or performed in visit on 03/01/22  CBC with Differential (Cancer Center Only)  Result Value Ref Range   WBC Count 4.4 4.0 - 10.5 K/uL   RBC 3.10 (L) 4.22 - 5.81 MIL/uL   Hemoglobin 10.2 (L) 13.0 - 17.0 g/dL   HCT 31.7 (L) 39.0 - 52.0 %   MCV 102.3 (H) 80.0 - 100.0 fL   MCH 32.9 26.0 - 34.0 pg   MCHC 32.2  30.0 - 36.0 g/dL   RDW 13.5 11.5 - 15.5 %   Platelet Count 208 150 - 400 K/uL   nRBC 0.0 0.0 - 0.2 %  Neutrophils Relative % 55 %   Neutro Abs 2.4 1.7 - 7.7 K/uL   Lymphocytes Relative 32 %   Lymphs Abs 1.4 0.7 - 4.0 K/uL   Monocytes Relative 9 %   Monocytes Absolute 0.4 0.1 - 1.0 K/uL   Eosinophils Relative 2 %   Eosinophils Absolute 0.1 0.0 - 0.5 K/uL   Basophils Relative 1 %   Basophils Absolute 0.0 0.0 - 0.1 K/uL   Immature Granulocytes 1 %   Abs Immature Granulocytes 0.02 0.00 - 0.07 K/uL  Sample to Blood Bank  Result Value Ref Range   Blood Bank Specimen SAMPLE AVAILABLE FOR TESTING    Sample Expiration      03/04/2022,2359 Performed at [facility], [place] [address] [place], [place] [address]     Assessment and Plan  There are no diagnoses linked to this encounter.  No follow-ups on file.   [name], MD

## 2022-03-15 ENCOUNTER — Inpatient Hospital Stay: Payer: Medicare HMO

## 2022-03-15 ENCOUNTER — Telehealth: Payer: Self-pay | Admitting: Hematology and Oncology

## 2022-03-15 ENCOUNTER — Inpatient Hospital Stay: Payer: Medicare HMO | Attending: Hematology and Oncology | Admitting: Hematology and Oncology

## 2022-03-15 ENCOUNTER — Encounter: Payer: Self-pay | Admitting: Hematology and Oncology

## 2022-03-15 ENCOUNTER — Encounter: Payer: Self-pay | Admitting: Cardiology

## 2022-03-15 VITALS — BP 130/56 | HR 76 | Temp 97.5°F | Resp 18 | Ht 66.5 in | Wt 235.6 lb

## 2022-03-15 DIAGNOSIS — E538 Deficiency of other specified B group vitamins: Secondary | ICD-10-CM | POA: Insufficient documentation

## 2022-03-15 DIAGNOSIS — D509 Iron deficiency anemia, unspecified: Secondary | ICD-10-CM

## 2022-03-15 DIAGNOSIS — D638 Anemia in other chronic diseases classified elsewhere: Secondary | ICD-10-CM | POA: Insufficient documentation

## 2022-03-15 DIAGNOSIS — D539 Nutritional anemia, unspecified: Secondary | ICD-10-CM | POA: Insufficient documentation

## 2022-03-15 LAB — CBC WITH DIFFERENTIAL (CANCER CENTER ONLY)
Abs Immature Granulocytes: 0.01 10*3/uL (ref 0.00–0.07)
Basophils Absolute: 0 10*3/uL (ref 0.0–0.1)
Basophils Relative: 0 %
Eosinophils Absolute: 0.1 10*3/uL (ref 0.0–0.5)
Eosinophils Relative: 2 %
HCT: 31.1 % — ABNORMAL LOW (ref 39.0–52.0)
Hemoglobin: 9.9 g/dL — ABNORMAL LOW (ref 13.0–17.0)
Immature Granulocytes: 0 %
Lymphocytes Relative: 27 %
Lymphs Abs: 1.4 10*3/uL (ref 0.7–4.0)
MCH: 32.9 pg (ref 26.0–34.0)
MCHC: 31.8 g/dL (ref 30.0–36.0)
MCV: 103.3 fL — ABNORMAL HIGH (ref 80.0–100.0)
Monocytes Absolute: 0.6 10*3/uL (ref 0.1–1.0)
Monocytes Relative: 12 %
Neutro Abs: 3.1 10*3/uL (ref 1.7–7.7)
Neutrophils Relative %: 59 %
Platelet Count: 207 10*3/uL (ref 150–400)
RBC: 3.01 MIL/uL — ABNORMAL LOW (ref 4.22–5.81)
RDW: 13.8 % (ref 11.5–15.5)
WBC Count: 5.2 10*3/uL (ref 4.0–10.5)
nRBC: 0 % (ref 0.0–0.2)

## 2022-03-15 MED ORDER — EPOETIN ALFA 40000 UNIT/ML IJ SOLN
40000.0000 [IU] | Freq: Once | INTRAMUSCULAR | Status: AC
  Administered 2022-03-15: 40000 [IU] via SUBCUTANEOUS
  Filled 2022-03-15: qty 1

## 2022-03-15 MED ORDER — CYANOCOBALAMIN 1000 MCG/ML IJ SOLN
1000.0000 ug | Freq: Once | INTRAMUSCULAR | Status: AC
  Administered 2022-03-15: 1000 ug via INTRAMUSCULAR
  Filled 2022-03-15: qty 1

## 2022-03-15 NOTE — Telephone Encounter (Signed)
Scheduled appointments per 3/14 los. Left voicemail.

## 2022-03-15 NOTE — Assessment & Plan Note (Signed)
Elevated LDH with normal direct [place] test: Concern for intravascular hemolysis possibly from severe valvular heart disease   11/17/2020: Hemoglobin 9.6, WBC 3.3, platelets 142, ANC 1.9, ferritin 158, iron saturation 31% (improv from 5%) 01/13/21: Hb 9.9, WBC: 5.5, MCV 105.6, Platelet 165, TB 1.3, Ferritin 50, Iron Sat: 25%,  01/16/2021: Hemoglobin 9.2, MCV 105.1, B12 117 (start B12 injections) 02/13/2021: Hemoglobin 10, MCV 103.1,   06/20/2021: Hemoglobin 10, MCV 100.3  09/12/2021: Hemoglobin 11.1 01/02/2022: Hemoglobin 10.8 (Retacrit injection started) 02/15/2022: Hemoglobin 10.7 (Retacrit changed to every 2 weeks) 03/15/2022:   Knee replacement surgery has been postponed until 03/27/2022 Patient starting testosterone replacement therapy which may also help his hemoglobin.

## 2022-03-16 ENCOUNTER — Encounter: Payer: Self-pay | Admitting: Family Medicine

## 2022-03-16 ENCOUNTER — Ambulatory Visit (INDEPENDENT_AMBULATORY_CARE_PROVIDER_SITE_OTHER): Payer: Medicare HMO | Admitting: Family Medicine

## 2022-03-16 VITALS — BP 110/60 | HR 79 | Temp 97.6°F | Ht 66.5 in | Wt 234.4 lb

## 2022-03-16 DIAGNOSIS — L02415 Cutaneous abscess of right lower limb: Secondary | ICD-10-CM | POA: Diagnosis not present

## 2022-03-16 DIAGNOSIS — L03115 Cellulitis of right lower limb: Secondary | ICD-10-CM | POA: Diagnosis not present

## 2022-03-16 DIAGNOSIS — I739 Peripheral vascular disease, unspecified: Secondary | ICD-10-CM | POA: Diagnosis not present

## 2022-03-16 DIAGNOSIS — S81812A Laceration without foreign body, left lower leg, initial encounter: Secondary | ICD-10-CM | POA: Diagnosis not present

## 2022-03-16 DIAGNOSIS — D539 Nutritional anemia, unspecified: Secondary | ICD-10-CM

## 2022-03-16 MED ORDER — CEFTRIAXONE SODIUM 1 G IJ SOLR
1.0000 g | Freq: Once | INTRAMUSCULAR | Status: AC
  Administered 2022-03-16: 1 g via INTRAMUSCULAR

## 2022-03-16 NOTE — Addendum Note (Signed)
Addended by: Carter Kitten on: 03/16/2022 09:46 AM   Modules accepted: Orders

## 2022-03-16 NOTE — Progress Notes (Signed)
Patient ID: Gregory [name], male    DOB: 1950-10-25, 72 y.o.   MRN: [id number]  This visit was conducted in person.  BP 110/60   Pulse 79   Temp 97.6 F (36.4 C) (Temporal)   Ht 5' 6.5" (1.689 m)   Wt 234 lb 6 oz (106.3 kg)   SpO2 96%   BMI 37.26 kg/m    CC:  Chief Complaint  Patient presents with   Cellulitis    Follow up    Subjective:   HPI: Gregory [name] is a 72 y.o. male presenting on 03/16/2022 for Cellulitis (Follow up)   Present for follow up cellulitis in right lower leg. After injury /skin tear on 03/05/2022  Treated 2 days ago with rocephin IM x 1 and started on  doxycycline 100 mg po BID x 10 days.   Today he reports he has had less pain in right leg.  Some discharge, less redness, improved swelling.  No fever, no flu like symptoms.   No SE form doxy .[name] No N/V, rash.   Hemoglobin decreased fro 10.2 to 9.9 with labs yesterday.. folowed  by Dr. Lindi Adie  Possible intravascular hemolysis possibly from severe vascular disease. S?p bioprosthetic AoV replacement     Relevant past medical, surgical, family and social history reviewed and updated as indicated. Interim medical history since our last visit reviewed. Allergies and medications reviewed and updated. Outpatient Medications Prior to Visit  Medication Sig Dispense Refill   acetaminophen (TYLENOL) 500 MG tablet Take 1,000 mg by mouth every 6 (six) hours as needed for moderate pain.     allopurinol (ZYLOPRIM) 100 MG tablet Take 1 tablet by mouth once daily 90 tablet 1   ALPRAZolam (XANAX) 0.5 MG tablet TAKE 1 TABLET BY MOUTH ONCE DAILY AS  NEEDED  FOR  ANXIETY  TAKE  30  MINUTES  PRIOR  TO  PLANE FLIGHT OR AS NEEDED FOR ANXIETY 20 tablet 0   aspirin EC 81 MG tablet Take 1 tablet (81 mg total) by mouth daily. Swallow whole. 30 tablet 11   atorvastatin (LIPITOR) 80 MG tablet Take 1 tablet by mouth once daily 90 tablet 3   colchicine 0.6 MG tablet Take 2 tablets by mouth once daily 60 tablet 2    doxycycline (VIBRA-TABS) 100 MG tablet Take 1 tablet (100 mg total) by mouth 2 (two) times daily. 20 tablet 0   furosemide (LASIX) 20 MG tablet TAKE TWO TABLETS BY MOUTH IN THE MORNING AND TAKE ONE TABLET BY MOUTH IN THE AFTERNOON 270 tablet 3   JATENZO 237 MG CAPS Take 237 mg by mouth 2 (two) times daily.     ketoconazole (NIZORAL) 2 % shampoo Apply 1 application topically 2 (two) times a week. 120 mL 1   latanoprost (XALATAN) 0.005 % ophthalmic solution Place 1 drop into both eyes every morning.      metoprolol succinate (TOPROL-XL) 25 MG 24 hr tablet Take 1 tablet (25 mg total) by mouth 2 (two) times daily. 180 tablet 3   Misc Natural Products (OSTEO BI-FLEX/5-LOXIN ADVANCED) TABS Take 2 tablets by mouth daily.     potassium chloride SA (KLOR-CON M20) 20 MEQ tablet Take 1 tablet by mouth once daily 90 tablet 3   sacubitril-valsartan (ENTRESTO) 97-103 MG Take 1 tablet by mouth 2 (two) times daily. 60 tablet 11   sertraline (ZOLOFT) 50 MG tablet Take 1 tablet by mouth once daily 90 tablet 0   spironolactone (ALDACTONE) 25 MG tablet Take 1  tablet by mouth once daily 90 tablet 1   traMADol (ULTRAM) 50 MG tablet Take 1 tablet (50 mg total) by mouth every 8 (eight) hours as needed. for pain 90 tablet 0   traZODone (DESYREL) 50 MG tablet Take 0.5-1 tablets (25-50 mg total) by mouth at bedtime as needed for sleep. 90 tablet 1   No facility-administered medications prior to visit.     Per HPI unless specifically indicated in ROS section below Review of Systems  Constitutional:  Negative for fatigue and fever.  HENT:  Negative for ear pain.   Eyes:  Negative for pain.  Respiratory:  Negative for cough and shortness of breath.   Cardiovascular:  Negative for chest pain, palpitations and leg swelling.  Gastrointestinal:  Negative for abdominal pain.  Genitourinary:  Negative for dysuria.  Musculoskeletal:  Negative for arthralgias.  Neurological:  Negative for syncope, light-headedness and  headaches.  Psychiatric/Behavioral:  Negative for dysphoric mood.    Objective:  BP 110/60   Pulse 79   Temp 97.6 F (36.4 C) (Temporal)   Ht 5' 6.5" (1.689 m)   Wt 234 lb 6 oz (106.3 kg)   SpO2 96%   BMI 37.26 kg/m   Wt Readings from Last 3 Encounters:  03/16/22 234 lb 6 oz (106.3 kg)  03/15/22 235 lb 9.6 oz (106.9 kg)  03/14/22 237 lb (107.5 kg)      Physical Exam Constitutional:      Appearance: He is well-developed.  HENT:     Head: Normocephalic.     Right Ear: Hearing normal.     Left Ear: Hearing normal.     Nose: Nose normal.  Neck:     Thyroid: No thyroid mass or thyromegaly.     Vascular: No carotid bruit.     Trachea: Trachea normal.  Cardiovascular:     Rate and Rhythm: Normal rate and regular rhythm.     Pulses: Normal pulses.     Heart sounds: Heart sounds not distant. Murmur heard.     No friction rub. No gallop.     Comments: No peripheral edema Pulmonary:     Effort: Pulmonary effort is normal. No respiratory distress.     Breath sounds: Normal breath sounds.  Skin:    General: Skin is warm and dry.     Findings: No rash.  Psychiatric:        Speech: Speech normal.        Behavior: Behavior normal.        Thought Content: Thought content normal.     Necrotic tissue on the bottom photo debrided.  Results for orders placed or performed in visit on 03/15/22  CBC with Differential (Cancer Center Only)  Result Value Ref Range   WBC Count 5.2 4.0 - 10.5 K/uL   RBC 3.01 (L) 4.22 - 5.81 MIL/uL   Hemoglobin 9.9 (L) 13.0 - 17.0 g/dL   HCT 31.1 (L) 39.0 - 52.0 %   MCV 103.3 (H) 80.0 - 100.0 fL   MCH 32.9 26.0 - 34.0 pg   MCHC 31.8 30.0 - 36.0 g/dL   RDW 13.8 11.5 - 15.5 %   Platelet Count 207 150 - 400 K/uL   nRBC 0.0 0.0 - 0.2 %   Neutrophils Relative % 59 %   Neutro Abs 3.1 1.7 - 7.7 K/uL   Lymphocytes Relative 27 %   Lymphs Abs 1.4 0.7 - 4.0 K/uL   Monocytes Relative 12 %   Monocytes Absolute 0.6 0.1 - 1.0  K/uL   Eosinophils Relative 2 %    Eosinophils Absolute 0.1 0.0 - 0.5 K/uL   Basophils Relative 0 %   Basophils Absolute 0.0 0.0 - 0.1 K/uL   Immature Granulocytes 0 %   Abs Immature Granulocytes 0.01 0.00 - 0.07 K/uL    Assessment and Plan  Peripheral vascular disease ([place]) Assessment & Plan: Chronic, this is contributing to slow wound healing.  Will follow closely.   Skin tear of left lower leg without complication, initial encounter  Cellulitis and abscess of right leg Assessment & Plan: Acute, interval improvement status post ceftriaxone 1 g IM x 1 and initiation of course of doxycycline.  Steri-Strips appear to be continued possible vector for infection. Today in office Steri-Strips removed and dead skin debrided from wound site with no complications, no bleeding. See pictures for before and after.  Will repeat injection of Rocephin 1 g x 1 and continue doxycycline.  He will return in 5 days for reevaluation of wound.  He will continue elevation of legs and washing with warm soapy water, compression and antibiotic ointment with bandage.   Macrocytic anemia Assessment & Plan: Chronic, followed by Dr. Lindi Adie  Discussed with wife and patient in detail today.  Per Dr. Geralyn Flash note this is likely secondary to valvular disease continued despite valve replacement. Reviewed most recent echocardiogram.  Continued anemia is limiting him from moving forward with his left knee surgery currently planned March 27. We discussed other possible causes of anemia.  Allopurinol  and cholchicine can contribute so I have sent a message to Dr. Lindi Adie to see if he feels that this could be a contributor to his persistent anemia and if it needs to be stopped or switched.   Unfortunately the controls his gout well.     Return in about 5 days (around 03/21/2022) for  follow up wound/cellulitis.   [name], MD

## 2022-03-16 NOTE — Assessment & Plan Note (Signed)
Chronic, this is contributing to slow wound healing.  Will follow closely.

## 2022-03-16 NOTE — Assessment & Plan Note (Signed)
Chronic, followed by Dr. Lindi Adie  Discussed with wife and patient in detail today.  Per Dr. Geralyn Flash note this is likely secondary to valvular disease continued despite valve replacement. Reviewed most recent echocardiogram.  Continued anemia is limiting him from moving forward with his left knee surgery currently planned March 27. We discussed other possible causes of anemia.  Allopurinol  and cholchicine can contribute so I have sent a message to Dr. Lindi Adie to see if he feels that this could be a contributor to his persistent anemia and if it needs to be stopped or switched.   Unfortunately the controls his gout well.

## 2022-03-16 NOTE — Assessment & Plan Note (Signed)
Acute, interval improvement status post ceftriaxone 1 g IM x 1 and initiation of course of doxycycline.  Steri-Strips appear to be continued possible vector for infection. Today in office Steri-Strips removed and dead skin debrided from wound site with no complications, no bleeding. See pictures for before and after.  Will repeat injection of Rocephin 1 g x 1 and continue doxycycline.  He will return in 5 days for reevaluation of wound.  He will continue elevation of legs and washing with warm soapy water, compression and antibiotic ointment with bandage.

## 2022-03-16 NOTE — Patient Instructions (Signed)
Complete antibiotics.  Continue washing with warm soapy water, apply topical antibiotic.  Elevate legs, use compression hose.  Call if redness spreading, increasing pain , fever or not tolerating antibiotics.

## 2022-03-19 ENCOUNTER — Ambulatory Visit: Payer: Self-pay | Admitting: Student

## 2022-03-19 ENCOUNTER — Other Ambulatory Visit: Payer: Self-pay

## 2022-03-19 ENCOUNTER — Encounter (HOSPITAL_COMMUNITY): Payer: Self-pay

## 2022-03-19 ENCOUNTER — Encounter (HOSPITAL_COMMUNITY)
Admission: RE | Admit: 2022-03-19 | Discharge: 2022-03-19 | Disposition: A | Discharge status: Home / Self Care | Payer: Medicare HMO | Source: Ambulatory Visit | Attending: Orthopedic Surgery | Admitting: Orthopedic Surgery

## 2022-03-19 ENCOUNTER — Encounter: Payer: Self-pay | Admitting: Cardiology

## 2022-03-19 VITALS — BP 127/61 | HR 70 | Temp 98.1°F | Resp 16 | Ht 66.5 in | Wt 230.0 lb

## 2022-03-19 DIAGNOSIS — Z950 Presence of cardiac pacemaker: Secondary | ICD-10-CM | POA: Diagnosis not present

## 2022-03-19 DIAGNOSIS — Z01812 Encounter for preprocedural laboratory examination: Secondary | ICD-10-CM | POA: Insufficient documentation

## 2022-03-19 DIAGNOSIS — M1712 Unilateral primary osteoarthritis, left knee: Secondary | ICD-10-CM | POA: Diagnosis not present

## 2022-03-19 DIAGNOSIS — I509 Heart failure, unspecified: Secondary | ICD-10-CM | POA: Diagnosis not present

## 2022-03-19 DIAGNOSIS — Z953 Presence of xenogenic heart valve: Secondary | ICD-10-CM | POA: Diagnosis not present

## 2022-03-19 DIAGNOSIS — Z951 Presence of aortocoronary bypass graft: Secondary | ICD-10-CM | POA: Diagnosis not present

## 2022-03-19 DIAGNOSIS — L03115 Cellulitis of right lower limb: Secondary | ICD-10-CM | POA: Insufficient documentation

## 2022-03-19 DIAGNOSIS — I110 Hypertensive heart disease with heart failure: Secondary | ICD-10-CM | POA: Diagnosis not present

## 2022-03-19 DIAGNOSIS — I2510 Atherosclerotic heart disease of native coronary artery without angina pectoris: Secondary | ICD-10-CM | POA: Diagnosis not present

## 2022-03-19 DIAGNOSIS — Z79899 Other long term (current) drug therapy: Secondary | ICD-10-CM | POA: Insufficient documentation

## 2022-03-19 DIAGNOSIS — Z01818 Encounter for other preprocedural examination: Secondary | ICD-10-CM

## 2022-03-19 HISTORY — DX: Anemia, unspecified: D64.9

## 2022-03-19 HISTORY — DX: Peripheral vascular disease, unspecified: I73.9

## 2022-03-19 HISTORY — DX: Heart failure, unspecified: I50.9

## 2022-03-19 HISTORY — DX: Presence of cardiac pacemaker: Z95.0

## 2022-03-19 HISTORY — DX: Depression, unspecified: F32.A

## 2022-03-19 LAB — BASIC METABOLIC PANEL
Anion gap: 7 (ref 5–15)
BUN: 16 mg/dL (ref 8–23)
CO2: 25 mmol/L (ref 22–32)
Calcium: 8.6 mg/dL — ABNORMAL LOW (ref 8.9–10.3)
Chloride: 104 mmol/L (ref 98–111)
Creatinine, Ser: 0.9 mg/dL (ref 0.61–1.24)
GFR, Estimated: >60 mL/min (ref >60)
Glucose, Bld: 101 mg/dL — ABNORMAL HIGH (ref 70–99)
Potassium: 4.7 mmol/L (ref 3.5–5.1)
Sodium: 136 mmol/L (ref 135–145)

## 2022-03-19 LAB — CBC
HCT: 33.4 % — ABNORMAL LOW (ref 39.0–52.0)
Hemoglobin: 10.2 g/dL — ABNORMAL LOW (ref 13.0–17.0)
MCH: 32.2 pg (ref 26.0–34.0)
MCHC: 30.5 g/dL (ref 30.0–36.0)
MCV: 105.4 fL — ABNORMAL HIGH (ref 80.0–100.0)
Platelets: 240 10*3/uL (ref 150–400)
RBC: 3.17 MIL/uL — ABNORMAL LOW (ref 4.22–5.81)
RDW: 14.2 % (ref 11.5–15.5)
WBC: 5.1 10*3/uL (ref 4.0–10.5)
nRBC: 0 % (ref 0.0–0.2)

## 2022-03-19 LAB — SURGICAL PCR SCREEN
MRSA, PCR: NEGATIVE
Staphylococcus aureus: NEGATIVE

## 2022-03-19 LAB — GLUCOSE, CAPILLARY: Glucose-Capillary: 97 mg/dL (ref 70–99)

## 2022-03-19 NOTE — H&P (Signed)
TOTAL KNEE ADMISSION H&P  Patient is being admitted for left total knee arthroplasty.  Subjective:  Chief Complaint:left knee pain.  HPI: Gregory [name], 72 y.o. male, has a history of pain and functional disability in the left knee due to arthritis and has failed non-surgical conservative treatments for greater than 12 weeks to includeNSAID's and/or analgesics, corticosteriod injections, flexibility and strengthening excercises, use of assistive devices, and activity modification.  Onset of symptoms was gradual, starting >10 years ago with rapidlly worsening course since that time. The patient noted no past surgery on the left knee(s).  Patient currently rates pain in the left knee(s) at 10 out of 10 with activity. Patient has night pain, worsening of pain with activity and weight bearing, pain that interferes with activities of daily living, pain with passive range of motion, crepitus, and joint swelling.  Patient has evidence of subchondral cysts, subchondral sclerosis, periarticular osteophytes, and joint space narrowing by imaging studies. There is no active infection.  Patient Active Problem List   Diagnosis Date Noted   Cellulitis and abscess of right leg 03/14/2022   Primary osteoarthritis of left knee 02/20/2022   Peripheral vascular disease ([place]) 01/17/2022   S/P placement of cardiac pacemaker 04/20/2021   Vitamin B 12 deficiency 01/16/2021   Pre-op evaluation 01/05/2021   ICD (implantable cardioverter-defibrillator) in place    S/p aortic valvuloplasty 10/18/2020   Complete heart block ([place]) 10/17/2020   Paravalvular leak (prosthetic valve) 10/10/2020   Severe aortic insufficiency 10/10/2020   Heart block AV complete ([place])    Iron deficiency anemia 09/22/2020   Skin tear of left lower leg without complication [id number]   Chronic gout of multiple sites 03/18/2020   Dilated cardiomyopathy ([place]) 03/13/2020   Dyslipidemia 03/13/2020   Coronary artery disease involving native  coronary artery of native heart without angina pectoris 12/12/2019   Pain due to onychomycosis of toenails of both feet 04/23/2019   S/P CABG x 3 04/15/2019   GAD (generalized anxiety disorder) 03/31/2019   Seborrheic dermatitis 03/31/2019   History of heparin-induced thrombocytopenia 03/23/2019   Long term (current) use of anticoagulants 03/23/2019   Atrial fibrillation ([place]) 03/23/2019   S/P aortic valve replacement 03/06/2019   Prediabetes 11/18/2018   Vitamin D deficiency 09/30/2018   Lymphedema 01/13/2018   Grade I diastolic dysfunction [id number]   Situational anxiety 10/30/2016   Bilateral primary osteoarthritis of knee 09/23/2015   Macrocytic anemia 08/06/2013   Class 1 drug-induced obesity with serious comorbidity and body mass index (BMI) of 34.0 to 34.9 in adult 03/26/2011   Aortic insufficiency 02/15/2011   Essential hypertension 10/09/2006   Past Medical History:  Diagnosis Date   Alcohol abuse    Anemia    Anxiety    Aortic valve disease    Arthritis    CHF (congestive heart failure) ([place])    Complication of anesthesia    slow to wake up and disoriented after OHS- 2021 at [facility]   Coronary artery disease    Depression    Glaucoma    Heart murmur    Hypertension    Myocardial infarction [facility])    Peripheral vascular disease ([place])    Pre-diabetes    Presence of permanent cardiac pacemaker    S/P balloon aortic valvuloplasty 10/18/2020   done for bioprosthetic aortic valve replacement perivalvular leaking reducing it from severe to mild.   Shingles     Past Surgical History:  Procedure Laterality Date   AORTIC ARCH ANGIOGRAPHY N/A 10/10/2020   Procedure: AORTIC  ARCH ANGIOGRAPHY;  Surgeon: Nelva Bush, MD;  Location: [place] CV LAB;  Service: Cardiovascular;  Laterality: N/A;   AORTIC VALVE REPLACEMENT N/A 03/06/2019   Procedure: AORTIC VALVE REPLACEMENT (AVR), USING INTUITY 25MM;  Surgeon: Wonda Olds, MD;  Location: [place];  Service: Open Heart  Surgery;  Laterality: N/A;   BALLOON AORTIC VALVE VALVULOPLASTY N/A 10/18/2020   Procedure: BALLOON AORTIC VALVE VALVULOPLASTY;  Surgeon: Sherren Mocha, MD;  Location: [place];  Service: Cardiovascular;  Laterality: N/A;   BIV PACEMAKER INSERTION CRT-P N/A 10/20/2020   Procedure: BIV PACEMAKER INSERTION CRT-P;  Surgeon: Vickie Epley, MD;  Location: [place] CV LAB;  Service: Cardiovascular;  Laterality: N/A;   CARDIAC CATHETERIZATION     CATARACT EXTRACTION     CORONARY ARTERY BYPASS GRAFT N/A 03/06/2019   Procedure: CORONARY ARTERY BYPASS GRAFTING (CABG), ON PUMP, TIMES THREE, USING BILATERAL INTERNAL MAMMARIES AND LEFT RADIAL ARTERY HARVEST;  Surgeon: Wonda Olds, MD;  Location: [place];  Service: Open Heart Surgery;  Laterality: N/A;  BILATERAL IMA   LEFT HEART CATH AND CORONARY ANGIOGRAPHY N/A 03/04/2019   Procedure: LEFT HEART CATH AND CORONARY ANGIOGRAPHY;  Surgeon: Burnell Blanks, MD;  Location: [place] CV LAB;  Service: Cardiovascular;  Laterality: N/A;   None     RADIAL ARTERY HARVEST Left 03/06/2019   Procedure: RADIAL ARTERY HARVEST;  Surgeon: Wonda Olds, MD;  Location: [place];  Service: Open Heart Surgery;  Laterality: Left;   RIGHT HEART CATH AND CORONARY/GRAFT ANGIOGRAPHY N/A 10/10/2020   Procedure: RIGHT HEART CATH AND CORONARY/GRAFT ANGIOGRAPHY;  Surgeon: Nelva Bush, MD;  Location: [place] CV LAB;  Service: Cardiovascular;  Laterality: N/A;   TEE WITHOUT CARDIOVERSION N/A 03/06/2019   Procedure: TRANSESOPHAGEAL ECHOCARDIOGRAM (TEE);  Surgeon: Wonda Olds, MD;  Location: [place];  Service: Open Heart Surgery;  Laterality: N/A;   TEE WITHOUT CARDIOVERSION N/A 10/10/2020   Procedure: TRANSESOPHAGEAL ECHOCARDIOGRAM (TEE);  Surgeon: Josue Hector, MD;  Location: [facility] ENDOSCOPY;  Service: Cardiovascular;  Laterality: N/A;   TEE WITHOUT CARDIOVERSION N/A 10/18/2020   Procedure: TRANSESOPHAGEAL ECHOCARDIOGRAM (TEE);  Surgeon: Sherren Mocha, MD;   Location: [place];  Service: Open Heart Surgery;  Laterality: N/A;   TEMPORARY PACEMAKER N/A 10/17/2020   Procedure: TEMPORARY PACEMAKER;  Surgeon: Evans Lance, MD;  Location: [place] CV LAB;  Service: Cardiovascular;  Laterality: N/A;    Current Outpatient Medications  Medication Sig Dispense Refill Last Dose   acetaminophen (TYLENOL) 500 MG tablet Take 1,000 mg by mouth every 6 (six) hours as needed for moderate pain.      allopurinol (ZYLOPRIM) 100 MG tablet Take 1 tablet by mouth once daily 90 tablet 1    ALPRAZolam (XANAX) 0.5 MG tablet TAKE 1 TABLET BY MOUTH ONCE DAILY AS  NEEDED  FOR  ANXIETY  TAKE  30  MINUTES  PRIOR  TO  PLANE FLIGHT OR AS NEEDED FOR ANXIETY 20 tablet 0    aspirin EC 81 MG tablet Take 1 tablet (81 mg total) by mouth daily. Swallow whole. 30 tablet 11    atorvastatin (LIPITOR) 80 MG tablet Take 1 tablet by mouth once daily 90 tablet 3    colchicine 0.6 MG tablet Take 2 tablets by mouth once daily 60 tablet 2    doxycycline (VIBRA-TABS) 100 MG tablet Take 1 tablet (100 mg total) by mouth 2 (two) times daily. 20 tablet 0    furosemide (LASIX) 20 MG tablet TAKE TWO TABLETS BY MOUTH IN  THE MORNING AND TAKE ONE TABLET BY MOUTH IN THE AFTERNOON 270 tablet 3    JATENZO 237 MG CAPS Take 237 mg by mouth 2 (two) times daily.      ketoconazole (NIZORAL) 2 % shampoo Apply 1 application topically 2 (two) times a week. 120 mL 1    latanoprost (XALATAN) 0.005 % ophthalmic solution Place 1 drop into both eyes every morning.       metoprolol succinate (TOPROL-XL) 25 MG 24 hr tablet Take 1 tablet (25 mg total) by mouth 2 (two) times daily. 180 tablet 3    Misc Natural Products (OSTEO BI-FLEX/5-LOXIN ADVANCED) TABS Take 2 tablets by mouth daily.      potassium chloride SA (KLOR-CON M20) 20 MEQ tablet Take 1 tablet by mouth once daily 90 tablet 3    sacubitril-valsartan (ENTRESTO) 97-103 MG Take 1 tablet by mouth 2 (two) times daily. 60 tablet 11    sertraline (ZOLOFT) 50 MG tablet  Take 1 tablet by mouth once daily 90 tablet 0    spironolactone (ALDACTONE) 25 MG tablet Take 1 tablet by mouth once daily 90 tablet 1    traMADol (ULTRAM) 50 MG tablet Take 1 tablet (50 mg total) by mouth every 8 (eight) hours as needed. for pain 90 tablet 0    traZODone (DESYREL) 50 MG tablet Take 0.5-1 tablets (25-50 mg total) by mouth at bedtime as needed for sleep. 90 tablet 1    No current facility-administered medications for this visit.   Allergies  Allergen Reactions   Heparin     HIT antibody and SRA positive    Social History   Tobacco Use   Smoking status: Never   Smokeless tobacco: Never  Substance Use Topics   Alcohol use: Yes    Alcohol/week: 12.0 standard drinks of alcohol    Types: 12 Cans of beer per week    Family History  Adopted: Yes  Problem Relation Age of Onset   Cancer Mother        breast cancer   Alcohol abuse Father    Breast cancer Sister      Review of Systems  Musculoskeletal:  Positive for arthralgias, gait problem and joint swelling.  All other systems reviewed and are negative.   Objective:  Physical Exam Constitutional:      Appearance: Normal appearance.  HENT:     Head: Normocephalic and atraumatic.     Nose: Nose normal.     Mouth/Throat:     Mouth: Mucous membranes are moist.     Pharynx: Oropharynx is clear.  Eyes:     Conjunctiva/sclera: Conjunctivae normal.  Cardiovascular:     Rate and Rhythm: Normal rate and regular rhythm.     Pulses: Normal pulses.     Heart sounds: Normal heart sounds.  Pulmonary:     Effort: Pulmonary effort is normal.     Breath sounds: Normal breath sounds.  Abdominal:     General: Abdomen is flat.     Palpations: Abdomen is soft.  Genitourinary:    Comments: deferred Musculoskeletal:     Cervical back: Normal range of motion and neck supple.     Comments: Examination of bilateral knees reveals no skin wounds or lesions. He is got some swelling, trace effusion. No warmth or erythema.  Varus deformities. He has tenderness to palpation medial joint line, lateral joint line, peripatellar retinacular tissues bilaterally. Range of motion is 15 to 110 degrees without any instability bilaterally. Painless range of motion of the hips.  Distally, there is no focal motor or sensory deficit. On the right side, he has a palpable pedal pulse. On the left side, his foot is warm and well-perfused, but I am unable to easily palpate pedal pulses. Venous stasis changes.   Skin:    General: Skin is warm and dry.     Capillary Refill: Capillary refill takes less than 2 seconds.  Neurological:     General: No focal deficit present.     Mental Status: He is alert and oriented to person, place, and time.  Psychiatric:        Mood and Affect: Mood normal.        Behavior: Behavior normal.        Thought Content: Thought content normal.        Judgment: Judgment normal.     Vital signs in last 24 hours: @VSRANGES @  Labs:   Estimated body mass index is 36.57 kg/m as calculated from the following:   Height as of an earlier encounter on 03/19/22: 5' 6.5" (1.689 m).   Weight as of an earlier encounter on 03/19/22: 104.3 kg.   Imaging Review Plain radiographs demonstrate severe degenerative joint disease of the left knee(s). The overall alignment ismild varus. The bone quality appears to be adequate for age and reported activity level.      Assessment/Plan:  End stage arthritis, left knee   The patient history, physical examination, clinical judgment of the provider and imaging studies are consistent with end stage degenerative joint disease of the left knee(s) and total knee arthroplasty is deemed medically necessary. The treatment options including medical management, injection therapy arthroscopy and arthroplasty were discussed at length. The risks and benefits of total knee arthroplasty were presented and reviewed. The risks due to aseptic loosening, infection, stiffness, patella  tracking problems, thromboembolic complications and other imponderables were discussed. The patient acknowledged the explanation, agreed to proceed with the plan and consent was signed. Patient is being admitted for inpatient treatment for surgery, pain control, PT, OT, prophylactic antibiotics, VTE prophylaxis, progressive ambulation and ADL's and discharge planning. The patient is planning to be discharged home with OPPT after an overnight stay.   Therapy Plans: outpatient therapy. At [facility]. PT eval 04/02/22.  Disposition: Home with wife Planned DVT Prophylaxis: aspirin 81mg  BID DME needed: walker. Has iceman ice machine.  PCP: Cleared. Cardiology: Cleared Vascular: Cleared.  TXA: IV Allergies:  - Heparin - HIT Anesthesia Concerns: None.  BMI: 37.1 Last HgbA1c: Not diabetic Other: - PAD.  - Recent RLE injury from cinder block. - Chronic anemia. Last Hgb 10.7 - Aspirin 81mg  at baseline.  - Tramadol q8 hours baseline.  - CAD sp CABG, with pacemaker/defibrillator  - Oxycodone, zofran.  - 02/15/22: Hgb 10.7 - 03/19/22: Hgb 10.2, Cr. 0.90, K+ 4.7    Patient's anticipated LOS is less than 2 midnights, meeting these requirements: - Younger than 37 - Lives within 1 hour of care - Has a competent adult at home to recover with post-op recover - NO history of  - Chronic pain requiring opiods  - Diabetes  - Coronary Artery Disease  - Heart failure  - Heart attack  - Stroke  - DVT/VTE  - Cardiac arrhythmia  - Respiratory Failure/COPD  - Renal failure  - Anemia  - Advanced Liver disease

## 2022-03-19 NOTE — Progress Notes (Signed)
PERIOPERATIVE PRESCRIPTION FOR IMPLANTED CARDIAC DEVICE PROGRAMMING  Patient Information: Name:  HOLT LEARD  DOB:  August 14, 1950  MRN:  [id number]    Planned Procedure:   left computer assisted total knee replacement  Surgeon:  Dr Morene Antu  Date of Procedure:   03/28/22  Cautery will be used.  Position during surgery:   Please send documentation back to:  [facility] [phone] # [phone])  Device Information:  Clinic EP Physician:  Dr. Lars Mage   Device Type:  Defibrillator Manufacturer and Phone #:  [employer]: [phone] Pacemaker Dependent?:  Yes.   Date of Last Device Check:  01/18/2022 Normal Device Function?:  Yes.    Electrophysiologist's Recommendations:  Have magnet available. Provide continuous ECG monitoring when magnet is used or reprogramming is to be performed.  Procedure should not interfere with device function.  No device programming or magnet placement needed.  Per Device Clinic Standing Orders, Simone Curia, RN  2:54 PM 03/19/2022

## 2022-03-20 ENCOUNTER — Encounter (HOSPITAL_COMMUNITY): Payer: Self-pay | Admitting: Physician Assistant

## 2022-03-20 LAB — HEMOGLOBIN A1C
Hgb A1c MFr Bld: 4.8 % (ref 4.8–5.6)
Mean Plasma Glucose: 91 mg/dL

## 2022-03-20 NOTE — Progress Notes (Signed)
Anesthesia Chart Review   Case: [id number] Date/Time: 03/28/22 0945   Procedure: COMPUTER ASSISTED TOTAL KNEE ARTHROPLASTY (Left: Knee) - 160   Anesthesia type: Choice   Pre-op diagnosis: Left knee osteoarthritis   Location: [facility] ROOM 07 / [facility]   Surgeons: Rod Can, MD       DISCUSSION:71 y.o. never smoker with h/o HTN, CAD and aortic valve disease s/p CABG/AVR 03/06/2019, pacemaker in place (device orders in 03/19/2022 progress note), left knee OA scheduled for above procedure 03/28/2022 with Dr. Rod Can.   Pt with wound/cellulitis to right lower leg, being treated by PCP.  Last seen 03/15/2022.  Rocephin IM repeated, course of doxycycline repeated. VM left with Dr. Sid Falcon scheduler regarding wound.   Clearance received from vascular surgeon which states he is ok to proceed from vascular standpoint.   Pt last seen by cardiology 12/01/2021. Per OV note, "echo today shows EF 60-65%, mild residual PVL and mean gradinet of 79mm hg. Recent Hg 11.1, pulse pressure normal. He is doing quite well with NYHA class I symptoms. Mostly limited by knee pain and scheduled for knee surgery in the near future. Continue regular follow up with Dr. Percival Spanish."  Per cardiology note 11/03/2021, "Preop: The patient is going to have knee surgery. He has no high risk symptoms. He has multiple comorbidities so there is risk related to this but no prohibitive cardiovascular risk according to ACC/AHA guidelines. No further cardiovascular testing is suggested. "  VS: BP 127/61   Pulse 70   Temp 36.7 C (Oral)   Resp 16   Ht 5' 6.5" (1.689 m)   Wt 104.3 kg   SpO2 99%   BMI 36.57 kg/m   PROVIDERS: Jinny Sanders, MD is PCP   [facility] Cardiologist:  [name], MD / Dr. Burt Knack & Dr. Cyndia Bent (structural heart)  [place] Electrophysiologist:  Vickie Epley, MD  LABS: Labs reviewed: Acceptable for surgery. (all labs ordered are listed, but only abnormal results are displayed)  Labs  Reviewed  CBC - Abnormal; Notable for the following components:      Result Value   RBC 3.17 (*)    Hemoglobin 10.2 (*)    HCT 33.4 (*)    MCV 105.4 (*)    All other components within normal limits  BASIC METABOLIC PANEL - Abnormal; Notable for the following components:   Glucose, Bld 101 (*)    Calcium 8.6 (*)    All other components within normal limits  SURGICAL PCR SCREEN  HEMOGLOBIN A1C  GLUCOSE, CAPILLARY     IMAGES:   EKG:   CV: Echo 12/01/2021 1. Left ventricular ejection fraction, by estimation, is 60 to 65%. The  left ventricle has normal function. The left ventricle has no regional  wall motion abnormalities. There is mild left ventricular hypertrophy.  Left ventricular diastolic parameters  are consistent with Grade I diastolic dysfunction (impaired relaxation).   2. Right ventricular systolic function is normal. The right ventricular  size is normal.   3. The mitral valve is normal in structure. No evidence of mitral valve  regurgitation. No evidence of mitral stenosis.   4. The aortic valve is normal in structure. Aortic valve regurgitation is  mild. No aortic stenosis is present. Aortic valve mean gradient measures  13.0 mmHg. Aortic valve Vmax measures 2.42 m/s.   5. Aortic dilatation noted. There is moderate dilatation of the ascending  aorta, measuring 45 mm.   6. The inferior vena cava is normal in size  with greater than 50%  respiratory variability, suggesting right atrial pressure of 3 mmHg.  Past Medical History:  Diagnosis Date   Alcohol abuse    Anemia    Anxiety    Aortic valve disease    Arthritis    CHF (congestive heart failure) ([facility])    Complication of anesthesia    slow to wake up and disoriented after OHS- 2021 at [facility]   Coronary artery disease    Depression    Glaucoma    Heart murmur    Hypertension    Myocardial infarction [facility])    Peripheral vascular disease ([facility])    Pre-diabetes    Presence of permanent cardiac pacemaker     S/P balloon aortic valvuloplasty 10/18/2020   done for bioprosthetic aortic valve replacement perivalvular leaking reducing it from severe to mild.   Shingles     Past Surgical History:  Procedure Laterality Date   AORTIC ARCH ANGIOGRAPHY N/A 10/10/2020   Procedure: AORTIC ARCH ANGIOGRAPHY;  Surgeon: Nelva Bush, MD;  Location: [place] CV LAB;  Service: Cardiovascular;  Laterality: N/A;   AORTIC VALVE REPLACEMENT N/A 03/06/2019   Procedure: AORTIC VALVE REPLACEMENT (AVR), USING INTUITY 25MM;  Surgeon: Wonda Olds, MD;  Location: [place];  Service: Open Heart Surgery;  Laterality: N/A;   BALLOON AORTIC VALVE VALVULOPLASTY N/A 10/18/2020   Procedure: BALLOON AORTIC VALVE VALVULOPLASTY;  Surgeon: Sherren Mocha, MD;  Location: [place];  Service: Cardiovascular;  Laterality: N/A;   BIV PACEMAKER INSERTION CRT-P N/A 10/20/2020   Procedure: BIV PACEMAKER INSERTION CRT-P;  Surgeon: Vickie Epley, MD;  Location: [place] CV LAB;  Service: Cardiovascular;  Laterality: N/A;   CARDIAC CATHETERIZATION     CATARACT EXTRACTION     CORONARY ARTERY BYPASS GRAFT N/A 03/06/2019   Procedure: CORONARY ARTERY BYPASS GRAFTING (CABG), ON PUMP, TIMES THREE, USING BILATERAL INTERNAL MAMMARIES AND LEFT RADIAL ARTERY HARVEST;  Surgeon: Wonda Olds, MD;  Location: [place];  Service: Open Heart Surgery;  Laterality: N/A;  BILATERAL IMA   LEFT HEART CATH AND CORONARY ANGIOGRAPHY N/A 03/04/2019   Procedure: LEFT HEART CATH AND CORONARY ANGIOGRAPHY;  Surgeon: Burnell Blanks, MD;  Location: [place] CV LAB;  Service: Cardiovascular;  Laterality: N/A;   None     RADIAL ARTERY HARVEST Left 03/06/2019   Procedure: RADIAL ARTERY HARVEST;  Surgeon: Wonda Olds, MD;  Location: [place];  Service: Open Heart Surgery;  Laterality: Left;   RIGHT HEART CATH AND CORONARY/GRAFT ANGIOGRAPHY N/A 10/10/2020   Procedure: RIGHT HEART CATH AND CORONARY/GRAFT ANGIOGRAPHY;  Surgeon: Nelva Bush, MD;   Location: [place] CV LAB;  Service: Cardiovascular;  Laterality: N/A;   TEE WITHOUT CARDIOVERSION N/A 03/06/2019   Procedure: TRANSESOPHAGEAL ECHOCARDIOGRAM (TEE);  Surgeon: Wonda Olds, MD;  Location: [place];  Service: Open Heart Surgery;  Laterality: N/A;   TEE WITHOUT CARDIOVERSION N/A 10/10/2020   Procedure: TRANSESOPHAGEAL ECHOCARDIOGRAM (TEE);  Surgeon: Josue Hector, MD;  Location: [facility] ENDOSCOPY;  Service: Cardiovascular;  Laterality: N/A;   TEE WITHOUT CARDIOVERSION N/A 10/18/2020   Procedure: TRANSESOPHAGEAL ECHOCARDIOGRAM (TEE);  Surgeon: Sherren Mocha, MD;  Location: [place];  Service: Open Heart Surgery;  Laterality: N/A;   TEMPORARY PACEMAKER N/A 10/17/2020   Procedure: TEMPORARY PACEMAKER;  Surgeon: Evans Lance, MD;  Location: [place] CV LAB;  Service: Cardiovascular;  Laterality: N/A;    MEDICATIONS:  acetaminophen (TYLENOL) 500 MG tablet   allopurinol (ZYLOPRIM) 100 MG tablet   ALPRAZolam (XANAX) 0.5 MG tablet  aspirin EC 81 MG tablet   atorvastatin (LIPITOR) 80 MG tablet   colchicine 0.6 MG tablet   doxycycline (VIBRA-TABS) 100 MG tablet   furosemide (LASIX) 20 MG tablet   JATENZO 237 MG CAPS   ketoconazole (NIZORAL) 2 % shampoo   latanoprost (XALATAN) 0.005 % ophthalmic solution   metoprolol succinate (TOPROL-XL) 25 MG 24 hr tablet   Misc Natural Products (OSTEO BI-FLEX/5-LOXIN ADVANCED) TABS   potassium chloride SA (KLOR-CON M20) 20 MEQ tablet   sacubitril-valsartan (ENTRESTO) 97-103 MG   sertraline (ZOLOFT) 50 MG tablet   spironolactone (ALDACTONE) 25 MG tablet   traMADol (ULTRAM) 50 MG tablet   traZODone (DESYREL) 50 MG tablet   No current facility-administered medications for this encounter.    Konrad Felix Ward, PA-C [facility] Pre-Surgical Testing [phone]

## 2022-03-21 ENCOUNTER — Ambulatory Visit (INDEPENDENT_AMBULATORY_CARE_PROVIDER_SITE_OTHER): Payer: Medicare HMO | Admitting: Family Medicine

## 2022-03-21 ENCOUNTER — Encounter: Payer: Self-pay | Admitting: Family Medicine

## 2022-03-21 VITALS — BP 106/52 | HR 87 | Temp 98.0°F | Ht 66.5 in | Wt 233.0 lb

## 2022-03-21 DIAGNOSIS — L03115 Cellulitis of right lower limb: Secondary | ICD-10-CM | POA: Diagnosis not present

## 2022-03-21 DIAGNOSIS — L02415 Cutaneous abscess of right lower limb: Secondary | ICD-10-CM | POA: Diagnosis not present

## 2022-03-21 NOTE — Progress Notes (Unsigned)
Patient ID: Gregory [name], male    DOB: [birth year], 72 y.o.   MRN: [id number]  This visit was conducted in person.  BP (!) 106/52 (BP Location: Left Arm, Patient Position: Sitting, Cuff Size: Normal)   Pulse 87   Temp 98 F (36.7 C) (Temporal)   Ht 5' 6.5" (1.689 m)   Wt 233 lb (105.7 kg)   SpO2 97%   BMI 37.04 kg/m    CC:  Chief Complaint  Patient presents with   Cellulitis    F/u on wound. Patient states its getting better; has two or three days left on abx.     Subjective:   HPI: Gregory [name] is a 71 y.o. male presenting on 03/21/2022 for Cellulitis (F/u on wound. Patient states its getting better; has two or three days left on abx. )  Significant improvement in right lower leg wound, minimal discharge, decreasing pain, no surrounding redness, no odor. No fever, no flulike symptoms.  He has upcoming knee surgery planned.       Relevant past medical, surgical, family and social history reviewed and updated as indicated. Interim medical history since our last visit reviewed. Allergies and medications reviewed and updated. Outpatient Medications Prior to Visit  Medication Sig Dispense Refill   acetaminophen (TYLENOL) 500 MG tablet Take 1,000 mg by mouth every 6 (six) hours as needed for moderate pain.     allopurinol (ZYLOPRIM) 100 MG tablet Take 1 tablet by mouth once daily 90 tablet 1   ALPRAZolam (XANAX) 0.5 MG tablet TAKE 1 TABLET BY MOUTH ONCE DAILY AS  NEEDED  FOR  ANXIETY  TAKE  30  MINUTES  PRIOR  TO  PLANE FLIGHT OR AS NEEDED FOR ANXIETY 20 tablet 0   aspirin EC 81 MG tablet Take 1 tablet (81 mg total) by mouth daily. Swallow whole. 30 tablet 11   atorvastatin (LIPITOR) 80 MG tablet Take 1 tablet by mouth once daily 90 tablet 3   colchicine 0.6 MG tablet Take 2 tablets by mouth once daily 60 tablet 2   doxycycline (VIBRA-TABS) 100 MG tablet Take 1 tablet (100 mg total) by mouth 2 (two) times daily. 20 tablet 0   furosemide (LASIX) 20 MG tablet TAKE TWO  TABLETS BY MOUTH IN THE MORNING AND TAKE ONE TABLET BY MOUTH IN THE AFTERNOON 270 tablet 3   JATENZO 237 MG CAPS Take 237 mg by mouth 2 (two) times daily.     ketoconazole (NIZORAL) 2 % shampoo Apply 1 application topically 2 (two) times a week. 120 mL 1   latanoprost (XALATAN) 0.005 % ophthalmic solution Place 1 drop into both eyes every morning.      metoprolol succinate (TOPROL-XL) 25 MG 24 hr tablet Take 1 tablet (25 mg total) by mouth 2 (two) times daily. 180 tablet 3   Misc Natural Products (OSTEO BI-FLEX/5-LOXIN ADVANCED) TABS Take 2 tablets by mouth daily.     potassium chloride SA (KLOR-CON M20) 20 MEQ tablet Take 1 tablet by mouth once daily 90 tablet 3   sacubitril-valsartan (ENTRESTO) 97-103 MG Take 1 tablet by mouth 2 (two) times daily. 60 tablet 11   sertraline (ZOLOFT) 50 MG tablet Take 1 tablet by mouth once daily 90 tablet 0   spironolactone (ALDACTONE) 25 MG tablet Take 1 tablet by mouth once daily 90 tablet 1   traMADol (ULTRAM) 50 MG tablet Take 1 tablet (50 mg total) by mouth every 8 (eight) hours as needed. for pain 90 tablet 0  traZODone (DESYREL) 50 MG tablet Take 0.5-1 tablets (25-50 mg total) by mouth at bedtime as needed for sleep. 90 tablet 1   No facility-administered medications prior to visit.     Per HPI unless specifically indicated in ROS section below Review of Systems  Constitutional:  Negative for fatigue and fever.  HENT:  Negative for ear pain.   Eyes:  Negative for pain.  Respiratory:  Negative for cough and shortness of breath.   Cardiovascular:  Negative for chest pain, palpitations and leg swelling.  Gastrointestinal:  Negative for abdominal pain.  Genitourinary:  Negative for dysuria.  Musculoskeletal:  Negative for arthralgias.  Neurological:  Negative for syncope, light-headedness and headaches.  Psychiatric/Behavioral:  Negative for dysphoric mood.    Objective:  BP (!) 106/52 (BP Location: Left Arm, Patient Position: Sitting, Cuff Size:  Normal)   Pulse 87   Temp 98 F (36.7 C) (Temporal)   Ht 5' 6.5" (1.689 m)   Wt 233 lb (105.7 kg)   SpO2 97%   BMI 37.04 kg/m   Wt Readings from Last 3 Encounters:  03/22/22 235 lb 14.4 oz (107 kg)  03/21/22 233 lb (105.7 kg)  03/19/22 230 lb (104.3 kg)      Physical Exam Constitutional:      Appearance: He is well-developed.  HENT:     Head: Normocephalic.     Right Ear: Hearing normal.     Left Ear: Hearing normal.     Nose: Nose normal.  Neck:     Thyroid: No thyroid mass or thyromegaly.     Vascular: No carotid bruit.     Trachea: Trachea normal.  Cardiovascular:     Rate and Rhythm: Normal rate and regular rhythm.     Pulses: Normal pulses.     Heart sounds: Heart sounds not distant. No murmur heard.    No friction rub. No gallop.     Comments: No peripheral edema Pulmonary:     Effort: Pulmonary effort is normal. No respiratory distress.     Breath sounds: Normal breath sounds.  Skin:    General: Skin is warm and dry.     Findings: Signs of injury present.  Psychiatric:        Speech: Speech normal.        Behavior: Behavior normal.        Thought Content: Thought content normal.       Results for orders placed or performed during the hospital encounter of 03/19/22  Surgical pcr screen   Specimen: Nasal Mucosa; Nasal Swab  Result Value Ref Range   MRSA, PCR NEGATIVE NEGATIVE   Staphylococcus aureus NEGATIVE NEGATIVE  CBC per protocol  Result Value Ref Range   WBC 5.1 4.0 - 10.5 K/uL   RBC 3.17 (L) 4.22 - 5.81 MIL/uL   Hemoglobin 10.2 (L) 13.0 - 17.0 g/dL   HCT 33.4 (L) 39.0 - 52.0 %   MCV 105.4 (H) 80.0 - 100.0 fL   MCH 32.2 26.0 - 34.0 pg   MCHC 30.5 30.0 - 36.0 g/dL   RDW 14.2 11.5 - 15.5 %   Platelets 240 150 - 400 K/uL   nRBC 0.0 0.0 - 0.2 %  Basic metabolic panel per protocol  Result Value Ref Range   Sodium 136 135 - 145 mmol/L   Potassium 4.7 3.5 - 5.1 mmol/L   Chloride 104 98 - 111 mmol/L   CO2 25 22 - 32 mmol/L   Glucose, Bld 101  (H) 70 - 99  mg/dL   BUN 16 8 - 23 mg/dL   Creatinine, Ser 0.90 0.61 - 1.24 mg/dL   Calcium 8.6 (L) 8.9 - 10.3 mg/dL   GFR, Estimated >60 >60 mL/min   Anion gap 7 5 - 15  Hemoglobin A1c per protocol  Result Value Ref Range   Hgb A1c MFr Bld 4.8 4.8 - 5.6 %   Mean Plasma Glucose 91 mg/dL  Glucose, capillary  Result Value Ref Range   Glucose-Capillary 97 70 - 99 mg/dL    Assessment and Plan  Cellulitis and abscess of right leg Assessment & Plan: Acute, cellulitis improving with antibiotics.  He will complete course.  Expect delayed wound healing given peripheral artery disease as well as venous insufficiency.  He will continue using compression and does not currently have any swelling in his legs bilaterally.     No follow-ups on file.   [name], MD

## 2022-03-22 ENCOUNTER — Telehealth: Payer: Self-pay

## 2022-03-22 ENCOUNTER — Inpatient Hospital Stay: Payer: Medicare HMO

## 2022-03-22 ENCOUNTER — Telehealth: Payer: Self-pay | Admitting: Hematology and Oncology

## 2022-03-22 ENCOUNTER — Inpatient Hospital Stay: Payer: Medicare HMO | Admitting: Hematology and Oncology

## 2022-03-22 ENCOUNTER — Other Ambulatory Visit: Payer: Self-pay

## 2022-03-22 VITALS — BP 107/51 | HR 74 | Temp 97.5°F | Resp 18 | Ht 66.5 in | Wt 235.9 lb

## 2022-03-22 DIAGNOSIS — D509 Iron deficiency anemia, unspecified: Secondary | ICD-10-CM | POA: Diagnosis not present

## 2022-03-22 DIAGNOSIS — D638 Anemia in other chronic diseases classified elsewhere: Secondary | ICD-10-CM | POA: Diagnosis not present

## 2022-03-22 DIAGNOSIS — D539 Nutritional anemia, unspecified: Secondary | ICD-10-CM | POA: Diagnosis not present

## 2022-03-22 DIAGNOSIS — E538 Deficiency of other specified B group vitamins: Secondary | ICD-10-CM | POA: Diagnosis not present

## 2022-03-22 LAB — CBC WITH DIFFERENTIAL (CANCER CENTER ONLY)
Abs Immature Granulocytes: 0.02 10*3/uL (ref 0.00–0.07)
Basophils Absolute: 0 10*3/uL (ref 0.0–0.1)
Basophils Relative: 0 %
Eosinophils Absolute: 0.1 10*3/uL (ref 0.0–0.5)
Eosinophils Relative: 1 %
HCT: 30 % — ABNORMAL LOW (ref 39.0–52.0)
Hemoglobin: 9.6 g/dL — ABNORMAL LOW (ref 13.0–17.0)
Immature Granulocytes: 0 %
Lymphocytes Relative: 29 %
Lymphs Abs: 1.5 10*3/uL (ref 0.7–4.0)
MCH: 32.5 pg (ref 26.0–34.0)
MCHC: 32 g/dL (ref 30.0–36.0)
MCV: 101.7 fL — ABNORMAL HIGH (ref 80.0–100.0)
Monocytes Absolute: 0.6 10*3/uL (ref 0.1–1.0)
Monocytes Relative: 12 %
Neutro Abs: 2.8 10*3/uL (ref 1.7–7.7)
Neutrophils Relative %: 58 %
Platelet Count: 234 10*3/uL (ref 150–400)
RBC: 2.95 MIL/uL — ABNORMAL LOW (ref 4.22–5.81)
RDW: 14.2 % (ref 11.5–15.5)
WBC Count: 5 10*3/uL (ref 4.0–10.5)
nRBC: 0 % (ref 0.0–0.2)

## 2022-03-22 LAB — VITAMIN B12: Vitamin B-12: 990 pg/mL — ABNORMAL HIGH (ref 180–914)

## 2022-03-22 LAB — FOLATE: Folate: 8.2 ng/mL (ref >5.9)

## 2022-03-22 LAB — SAMPLE TO BLOOD BANK

## 2022-03-22 LAB — IRON AND IRON BINDING CAPACITY (CC-WL,HP ONLY)
Iron: 37 ug/dL — ABNORMAL LOW (ref 45–182)
Saturation Ratios: 10 % — ABNORMAL LOW (ref 17.9–39.5)
TIBC: 378 ug/dL (ref 250–450)
UIBC: 341 ug/dL (ref 117–376)

## 2022-03-22 LAB — FERRITIN: Ferritin: 22 ng/mL — ABNORMAL LOW (ref 24–336)

## 2022-03-22 MED ORDER — CYANOCOBALAMIN 1000 MCG/ML IJ SOLN
1000.0000 ug | Freq: Once | INTRAMUSCULAR | Status: AC
  Administered 2022-03-22: 1000 ug via INTRAMUSCULAR
  Filled 2022-03-22: qty 1

## 2022-03-22 MED ORDER — EPOETIN ALFA 40000 UNIT/ML IJ SOLN
40000.0000 [IU] | Freq: Once | INTRAMUSCULAR | Status: AC
  Administered 2022-03-22: 40000 [IU] via SUBCUTANEOUS
  Filled 2022-03-22: qty 1

## 2022-03-22 MED ORDER — CYANOCOBALAMIN 1000 MCG/ML IJ SOLN: 1000.0000 ug | Freq: Once | INTRAMUSCULAR | Status: DC

## 2022-03-22 NOTE — Assessment & Plan Note (Signed)
Elevated LDH with normal direct Coombs test: Concern for intravascular hemolysis possibly from severe valvular heart disease   11/17/2020: Hemoglobin 9.6, WBC 3.3, platelets 142, ANC 1.9, ferritin 158, iron saturation 31% (improv from 5%) 01/13/21: Hb 9.9, WBC: 5.5, MCV 105.6, Platelet 165, TB 1.3, Ferritin 50, Iron Sat: 25%,  01/16/2021: Hemoglobin 9.2, MCV 105.1, B12 117 (start B12 injections) 02/13/2021: Hemoglobin 10, MCV 103.1,   06/20/2021: Hemoglobin 10, MCV 100.3  09/12/2021: Hemoglobin 11.1 01/02/2022: Hemoglobin 10.8 (Retacrit injection started) 02/15/2022: Hemoglobin 10.7 (Retacrit changed to every 2 weeks) 03/15/2022: Hemoglobin 9.9, MCV 103.3 03/22/2022:   Recommending Retacrit every 2 weeks and B12 injections every week We will recheck labs in 1 week   Knee replacement surgery has been postponed until 03/27/2022 Patient starting testosterone replacement therapy which may also help his hemoglobin.   Patient is not sure if he will undergo the knee surgery.

## 2022-03-22 NOTE — Patient Instructions (Signed)

## 2022-03-22 NOTE — Telephone Encounter (Signed)
Spoke with Dr. Lindi Adie he stated that he wanted the patient to receive his Retacrit injection today since he is having surgery next week. Patinet did receive retacrit last week as well but physician said it could be given every week.

## 2022-03-22 NOTE — Progress Notes (Signed)
Patient Care Team: Jinny Sanders, MD as PCP - General (Family Medicine) [name], MD as PCP - Cardiology (Cardiology) Vickie Epley, MD as PCP - Electrophysiology (Cardiology) Lendon Colonel, NP as Nurse Practitioner (Cardiology) Charlton Haws, [facility] as Pharmacist (Pharmacist)  DIAGNOSIS:  Encounter Diagnosis  Name Primary?   Iron deficiency anemia, unspecified iron deficiency anemia type Yes    CHIEF COMPLIANT: Follow-up of  chronic anemia     INTERVAL HISTORY: Gregory [name] is a 72 y.o. with above-mentioned history of heparin-induced thrombocytopenia and chronic macrocytic anemia. He presents to the clinic today for follow-up.  He is in the works to undergo knee replacement surgery next Wednesday.  He is here today accompanied by his wife to discuss her lab results.  He is going to receive Retacrit injection today.   ALLERGIES:  is allergic to heparin.  MEDICATIONS:  Current Outpatient Medications  Medication Sig Dispense Refill   acetaminophen (TYLENOL) 500 MG tablet Take 1,000 mg by mouth every 6 (six) hours as needed for moderate pain.     allopurinol (ZYLOPRIM) 100 MG tablet Take 1 tablet by mouth once daily 90 tablet 1   ALPRAZolam (XANAX) 0.5 MG tablet TAKE 1 TABLET BY MOUTH ONCE DAILY AS  NEEDED  FOR  ANXIETY  TAKE  30  MINUTES  PRIOR  TO  PLANE FLIGHT OR AS NEEDED FOR ANXIETY 20 tablet 0   aspirin EC 81 MG tablet Take 1 tablet (81 mg total) by mouth daily. Swallow whole. 30 tablet 11   atorvastatin (LIPITOR) 80 MG tablet Take 1 tablet by mouth once daily 90 tablet 3   colchicine 0.6 MG tablet Take 2 tablets by mouth once daily 60 tablet 2   doxycycline (VIBRA-TABS) 100 MG tablet Take 1 tablet (100 mg total) by mouth 2 (two) times daily. 20 tablet 0   furosemide (LASIX) 20 MG tablet TAKE TWO TABLETS BY MOUTH IN THE MORNING AND TAKE ONE TABLET BY MOUTH IN THE AFTERNOON 270 tablet 3   JATENZO 237 MG CAPS Take 237 mg by mouth 2 (two) times daily.      ketoconazole (NIZORAL) 2 % shampoo Apply 1 application topically 2 (two) times a week. 120 mL 1   latanoprost (XALATAN) 0.005 % ophthalmic solution Place 1 drop into both eyes every morning.      metoprolol succinate (TOPROL-XL) 25 MG 24 hr tablet Take 1 tablet (25 mg total) by mouth 2 (two) times daily. 180 tablet 3   Misc Natural Products (OSTEO BI-FLEX/5-LOXIN ADVANCED) TABS Take 2 tablets by mouth daily.     potassium chloride SA (KLOR-CON M20) 20 MEQ tablet Take 1 tablet by mouth once daily 90 tablet 3   sacubitril-valsartan (ENTRESTO) 97-103 MG Take 1 tablet by mouth 2 (two) times daily. 60 tablet 11   sertraline (ZOLOFT) 50 MG tablet Take 1 tablet by mouth once daily 90 tablet 0   spironolactone (ALDACTONE) 25 MG tablet Take 1 tablet by mouth once daily 90 tablet 1   traMADol (ULTRAM) 50 MG tablet Take 1 tablet (50 mg total) by mouth every 8 (eight) hours as needed. for pain 90 tablet 0   traZODone (DESYREL) 50 MG tablet Take 0.5-1 tablets (25-50 mg total) by mouth at bedtime as needed for sleep. 90 tablet 1   No current facility-administered medications for this visit.    PHYSICAL EXAMINATION: ECOG PERFORMANCE STATUS: 1 - Symptomatic but completely ambulatory  Vitals:   03/22/22 1321  BP: (!) 107/51  Pulse: 74  Resp: 18  Temp: (!) 97.5 F (36.4 C)  SpO2: 96%   Filed Weights   03/22/22 1321  Weight: 235 lb 14.4 oz (107 kg)      LABORATORY DATA:  I have reviewed the data as listed    Latest Ref Rng & Units 03/19/2022    9:31 AM 01/11/2022    7:58 AM 09/12/2021    9:18 AM  CMP  Glucose 70 - 99 mg/dL 101  90  117   BUN 8 - 23 mg/dL 16  18  20    Creatinine 0.61 - 1.24 mg/dL 0.90  0.79  0.98   Sodium 135 - 145 mmol/L 136  141  139   Potassium 3.5 - 5.1 mmol/L 4.7  4.4  4.6   Chloride 98 - 111 mmol/L 104  106  104   CO2 22 - 32 mmol/L 25  30  30    Calcium 8.9 - 10.3 mg/dL 8.6  9.4  9.1   Total Protein 6.5 - 8.1 g/dL  6.7  6.7   Total Bilirubin 0.3 - 1.2 mg/dL  1.4   1.8   Alkaline Phos 38 - 126 U/L  39  36   AST 15 - 41 U/L  50  63   ALT 0 - 44 U/L  19  22     Lab Results  Component Value Date   WBC 5.0 03/22/2022   HGB 9.6 (L) 03/22/2022   HCT 30.0 (L) 03/22/2022   MCV 101.7 (H) 03/22/2022   PLT 234 03/22/2022   NEUTROABS 2.8 03/22/2022    ASSESSMENT & PLAN:  Iron deficiency anemia Elevated LDH with normal direct Coombs test: Concern for intravascular hemolysis possibly from severe valvular heart disease   11/17/2020: Hemoglobin 9.6, WBC 3.3, platelets 142, ANC 1.9, ferritin 158, iron saturation 31% (improv from 5%) 01/13/21: Hb 9.9, WBC: 5.5, MCV 105.6, Platelet 165, TB 1.3, Ferritin 50, Iron Sat: 25%,  01/16/2021: Hemoglobin 9.2, MCV 105.1, B12 117 (start B12 injections) 02/13/2021: Hemoglobin 10, MCV 103.1,   06/20/2021: Hemoglobin 10, MCV 100.3  09/12/2021: Hemoglobin 11.1 01/02/2022: Hemoglobin 10.8 (Retacrit injection started) 02/15/2022: Hemoglobin 10.7 (Retacrit changed to every 2 weeks) 03/15/2022: Hemoglobin 9.9, MCV 103.3 03/22/2022: Hemoglobin 9.6, MCV 101.7   Recommending Retacrit every 2 weeks and B12 injections every week He may be undergoing surgery next Wednesday.  I discussed with the patient that if necessary we can give him a unit of blood day before his surgery.  I sent a message to Dr. Lyla Glassing to inform me if he wants [place] to proceed with blood transfusion.   Knee replacement surgery has been postponed until 03/27/2022 Patient starting testosterone replacement therapy which may also help his hemoglobin.   We will see him back in 2 weeks with labs and injections and follow-up with [name].    No orders of the defined types were placed in this encounter.  The patient has a good understanding of the overall plan. he agrees with it. he will call with any problems that may develop before the next visit here. Total time spent: 30 mins including face to face time and time spent for planning, charting and co-ordination of care    Harriette Ohara, MD 03/22/22    I Gardiner Coins am acting as a Education administrator for [employer]  I have reviewed the above documentation for accuracy and completeness, and I agree with the above.

## 2022-03-22 NOTE — Assessment & Plan Note (Signed)
Acute, cellulitis improving with antibiotics.  He will complete course.  Expect delayed wound healing given peripheral artery disease as well as venous insufficiency.  He will continue using compression and does not currently have any swelling in his legs bilaterally.

## 2022-03-22 NOTE — Telephone Encounter (Signed)
Left patient a vm regarding upcoming appointment  

## 2022-03-23 ENCOUNTER — Other Ambulatory Visit: Payer: Self-pay | Admitting: *Deleted

## 2022-03-23 DIAGNOSIS — D509 Iron deficiency anemia, unspecified: Secondary | ICD-10-CM

## 2022-03-26 ENCOUNTER — Telehealth: Payer: Self-pay | Admitting: *Deleted

## 2022-03-26 ENCOUNTER — Encounter: Payer: Self-pay | Admitting: Hematology and Oncology

## 2022-03-26 DIAGNOSIS — S81801D Unspecified open wound, right lower leg, subsequent encounter: Secondary | ICD-10-CM | POA: Diagnosis not present

## 2022-03-26 DIAGNOSIS — M1712 Unilateral primary osteoarthritis, left knee: Secondary | ICD-10-CM | POA: Diagnosis not present

## 2022-03-26 NOTE — Telephone Encounter (Signed)
Received call from pt spouse stating pt is unable to proceed with total knee surgery this week due to a wound that has developed on his other leg.  States pt will be following up with wound care and appt for blood transfusion needs to be canceled for tomorrow since pt will not be undergoing surgery.  Provider notified and appt canceled.

## 2022-03-27 ENCOUNTER — Inpatient Hospital Stay: Payer: Medicare HMO

## 2022-03-28 ENCOUNTER — Ambulatory Visit (HOSPITAL_COMMUNITY): Admission: RE | Admit: 2022-03-28 | Payer: Medicare HMO | Source: Home / Self Care | Admitting: Orthopedic Surgery

## 2022-03-28 ENCOUNTER — Encounter (HOSPITAL_COMMUNITY): Admission: RE | Payer: Self-pay | Source: Home / Self Care

## 2022-03-28 SURGERY — ARTHROPLASTY, KNEE, TOTAL, USING IMAGELESS COMPUTER-ASSISTED NAVIGATION
Anesthesia: Choice | Site: Knee | Laterality: Left

## 2022-03-29 ENCOUNTER — Inpatient Hospital Stay: Payer: Medicare HMO

## 2022-03-29 ENCOUNTER — Inpatient Hospital Stay: Payer: Medicare HMO | Admitting: Adult Health

## 2022-04-04 ENCOUNTER — Other Ambulatory Visit: Payer: Self-pay | Admitting: Cardiology

## 2022-04-06 ENCOUNTER — Inpatient Hospital Stay: Payer: Medicare HMO

## 2022-04-06 ENCOUNTER — Telehealth: Payer: Self-pay | Admitting: Hematology and Oncology

## 2022-04-06 ENCOUNTER — Inpatient Hospital Stay: Payer: Medicare HMO | Admitting: Hematology and Oncology

## 2022-04-06 NOTE — Assessment & Plan Note (Deleted)
Elevated LDH with normal direct Coombs test: Concern for intravascular hemolysis possibly from severe valvular heart disease   11/17/2020: Hemoglobin 9.6, WBC 3.3, platelets 142, ANC 1.9, ferritin 158, iron saturation 31% (improv from 5%) 01/13/21: Hb 9.9, WBC: 5.5, MCV 105.6, Platelet 165, TB 1.3, Ferritin 50, Iron Sat: 25%,  01/16/2021: Hemoglobin 9.2, MCV 105.1, B12 117 (start B12 injections) 02/13/2021: Hemoglobin 10, MCV 103.1,   06/20/2021: Hemoglobin 10, MCV 100.3  09/12/2021: Hemoglobin 11.1 01/02/2022: Hemoglobin 10.8 (Retacrit injection started) 02/15/2022: Hemoglobin 10.7 (Retacrit changed to every 2 weeks) 03/15/2022: Hemoglobin 9.9, MCV 103.3 04/06/2022:   Recommending Retacrit every 2 weeks and B12 injections every week Patient surgery was canceled because of wound infection.  Therefore preoperative blood transfusion was also canceled. Patient currently takes testosterone replacement therapy.  Return to clinic every 2 weeks for labs injections and follow-up.

## 2022-04-10 ENCOUNTER — Encounter (HOSPITAL_BASED_OUTPATIENT_CLINIC_OR_DEPARTMENT_OTHER): Payer: Medicare HMO | Attending: Internal Medicine | Admitting: General Surgery

## 2022-04-10 DIAGNOSIS — I5032 Chronic diastolic (congestive) heart failure: Secondary | ICD-10-CM | POA: Insufficient documentation

## 2022-04-10 DIAGNOSIS — I872 Venous insufficiency (chronic) (peripheral): Secondary | ICD-10-CM | POA: Diagnosis not present

## 2022-04-10 DIAGNOSIS — I110 Hypertensive heart disease with heart failure: Secondary | ICD-10-CM | POA: Insufficient documentation

## 2022-04-10 DIAGNOSIS — I2510 Atherosclerotic heart disease of native coronary artery without angina pectoris: Secondary | ICD-10-CM | POA: Diagnosis not present

## 2022-04-10 DIAGNOSIS — I4891 Unspecified atrial fibrillation: Secondary | ICD-10-CM | POA: Insufficient documentation

## 2022-04-10 DIAGNOSIS — I351 Nonrheumatic aortic (valve) insufficiency: Secondary | ICD-10-CM | POA: Insufficient documentation

## 2022-04-10 DIAGNOSIS — L97812 Non-pressure chronic ulcer of other part of right lower leg with fat layer exposed: Secondary | ICD-10-CM | POA: Insufficient documentation

## 2022-04-10 DIAGNOSIS — I890 Lymphedema, not elsewhere classified: Secondary | ICD-10-CM | POA: Diagnosis not present

## 2022-04-10 DIAGNOSIS — Z950 Presence of cardiac pacemaker: Secondary | ICD-10-CM | POA: Diagnosis not present

## 2022-04-10 DIAGNOSIS — I420 Dilated cardiomyopathy: Secondary | ICD-10-CM | POA: Diagnosis not present

## 2022-04-10 DIAGNOSIS — I739 Peripheral vascular disease, unspecified: Secondary | ICD-10-CM | POA: Diagnosis not present

## 2022-04-10 NOTE — Progress Notes (Signed)
Gregory [name], Gregory [name] ([id number]) [id number] Page 1 of 9 Visit Report for 04/10/2022 Allergy List Details Patient Name: Date of Service: Gregory [name], [place] RD [name] 04/10/2022 9:00 A M Medical Record Number: [id number] Patient Account Number: [id number] Date of Birth/Sex: Treating RN: 03-Feb-1950 (72 y.o. Gregory [name] Primary Care Gregory [name]: Gregory [name] Other Clinician: Referring Gregory [name]: Treating Gregory [name]/Extender: Gregory [name], Gregory [name] in Treatment: 0 Allergies Active Allergies heparin Reaction: HIT Allergy Notes Electronic Signature(s) Signed: 04/11/2022 5:09:22 PM By: [name] RN, BSN Previous Signature: 04/10/2022 7:55:30 AM Version By: [name] RN, BSN Entered By: [name] on 04/10/2022 08:06:08 -------------------------------------------------------------------------------- Arrival Information Details Patient Name: Date of Service: Gregory [name], Gregory RD [name] 04/10/2022 9:00 A M Medical Record Number: [id number] Patient Account Number: [id number] Date of Birth/Sex: Treating RN: [birth year] (72 y.o. Gregory [name] Primary Care Gregory [name]: Gregory [name] Other Clinician: Referring [name]: Treating Gregory [name]/Extender: Gregory [name], Gregory [name] in Treatment: 0 Visit Information Patient Arrived: Cane Arrival Time: 09:01 Accompanied By: spouse Transfer Assistance: None Patient Identification Verified: Yes Secondary Verification Process Completed: Yes Patient Requires Transmission-Based Precautions: No Patient Has Alerts: Yes Patient Alerts: R ABI N/C Electronic Signature(s) Signed: 04/11/2022 5:09:22 PM By: [name] RN, BSN Entered By: [name] on 04/10/2022 09:30:51 -------------------------------------------------------------------------------- Clinic Level of Care Assessment Details Patient Name: Date of Service: Gregory [name], Gregory RD [name] 04/10/2022 9:00 A M Medical Record Number: [id number] Patient  Account Number: [id number] Date of Birth/Sex: Treating RN: [birth year] (72 y.o. Gregory [name] Primary Care Gregory [name]: Gregory [name] Other Clinician: Referring Anjelika Ausburn: Treating Gregory [name]/Extender: Gregory [name], Gregory [name] in Treatment: 0 Clinic Level of Care Assessment Items TOOL 1 Quantity Score []  - 0 Use when EandM and Procedure is performed on INITIAL visit ASSESSMENTS - Nursing Assessment / Reassessment X- 1 20 General Physical Exam (combine w/ comprehensive assessment (listed just below) when performed on new pt. [address]Gregory [name], Gregory [name] ([id number]) [id number] Page 2 of 9 X- 1 25 Comprehensive Assessment (HX, ROS, Risk Assessments, Wounds Hx, etc.) ASSESSMENTS - Wound and Skin Assessment / Reassessment []  - 0 Dermatologic / Skin Assessment (not related to wound area) ASSESSMENTS - Ostomy and/or Continence Assessment and Care []  - 0 Incontinence Assessment and Management []  - 0 Ostomy Care Assessment and Management (repouching, etc.) PROCESS - Coordination of Care X - Simple Patient / Family Education for ongoing care 1 15 []  - 0 Complex (extensive) Patient / Family Education for ongoing care X- 1 10 Staff obtains Chiropractor, Records, T Results / Process Orders est []  - 0 Staff telephones HHA, Nursing Homes / Clarify orders / etc []  - 0 Routine Transfer to another Facility (non-emergent condition) []  - 0 Routine Hospital Admission (non-emergent condition) X- 1 15 New Admissions / Manufacturing engineer / Ordering NPWT Apligraf, etc. , []  - 0 Emergency Hospital Admission (emergent condition) PROCESS - Special Needs []  - 0 Pediatric / Minor Patient Management []  - 0 Isolation Patient Management []  - 0 Hearing / Language / Visual special needs []  - 0 Assessment of Community assistance (transportation, D/C planning, etc.) []  - 0 Additional assistance / Altered mentation []  - 0 Support Surface(s) Assessment (bed, cushion,  seat, etc.) INTERVENTIONS - Miscellaneous []  - 0 External ear exam []  - 0 Patient Transfer (multiple staff / Nurse, adult / Similar devices) []  - 0 Simple Staple / Suture removal (25 or less) []  - 0 Complex Staple / Suture removal (26 or more) []  - 0 Hypo/Hyperglycemic Management (do not check if billed separately) X- 1  15 Ankle / Brachial Index (ABI) - do not check if billed separately Has the patient been seen at the hospital within the last three years: Yes Total Score: 100 Level Of Care: New/Established - Level 3 Electronic Signature(s) Signed: 04/11/2022 5:09:22 PM By: [name] RN, BSN Entered By: [name] on 04/10/2022 09:45:53 -------------------------------------------------------------------------------- Compression Therapy Details Patient Name: Date of Service: Gregory [name], Gregory RD [name] 04/10/2022 9:00 A M Medical Record Number: [id number] Patient Account Number: [id number] Date of Birth/Sex: Treating RN: Oct 19, 1950 (72 y.o. Gregory [name] Primary Care Theon Sobotka: Gregory [name] Other Clinician: Referring Gregory [name]: Treating Gregory [name]/Extender: Gregory [name], Gregory [name] in Treatment: 0 Compression Therapy Performed for Wound Assessment: Wound #1 Right,Lateral Lower Leg Performed By: Clinician [name], RN Compression Type: Double Layer Post Procedure Diagnosis Same as Pre-procedure Gregory [name] ([id number]) [id number] Page 3 of 9 Notes urgo Production assistant, radio) Signed: 04/11/2022 5:09:22 PM By: [name] RN, BSN Entered By: [name] on 04/10/2022 09:41:04 -------------------------------------------------------------------------------- Encounter Discharge Information Details Patient Name: Date of Service: Gregory [name], Gregory RD [name] 04/10/2022 9:00 A M Medical Record Number: [id number] Patient Account Number: [id number] Date of Birth/Sex: Treating RN: Mar 15, 1950 (72 y.o. Gregory [name] Primary  Care Gregory [name]: Gregory [name] Other Clinician: Referring Gregory [name]: Treating Gregory [name]/Extender: Gregory [name], Gregory [name] in Treatment: 0 Encounter Discharge Information Items Post Procedure Vitals Discharge Condition: Stable Temperature (F): 97.9 Ambulatory Status: Cane Pulse (bpm): 74 Discharge Destination: Home Respiratory Rate (breaths/min): 18 Transportation: Private Auto Blood Pressure (mmHg): 154/77 Accompanied By: spouse Schedule Follow-up Appointment: Yes Clinical Summary of Care: Patient Declined Electronic Signature(s) Signed: 04/11/2022 5:09:22 PM By: [name] RN, BSN Entered By: [name] on 04/10/2022 10:03:21 -------------------------------------------------------------------------------- Lower Extremity Assessment Details Patient Name: Date of Service: Gregory [name], Gregory RD [name] 04/10/2022 9:00 A M Medical Record Number: [id number] Patient Account Number: [id number] Date of Birth/Sex: Treating RN: 04/16/1950 (72 y.o. Gregory [name] Primary Care Atiyah Bauer: Gregory [name] Other Clinician: Referring Janaiyah Blackard: Treating Khamille Beynon/Extender: Gregory [name], Gregory [name] in Treatment: 0 Edema Assessment Assessed: [Left: No] [Right: No] Edema: [Left: Ye] [Right: s] Calf Left: Right: Point of Measurement: From Medial Instep 37 cm Ankle Left: Right: Point of Measurement: From Medial Instep 24.5 cm Vascular Assessment Pulses: Dorsalis Pedis Palpable: [Right:Yes] Notes right DP and PT pulses noncompressible Electronic Signature(s) Signed: 04/11/2022 5:09:22 PM By: [name] RN, BSN Entered By: [name] on 04/10/2022 09:29:51 Gregory [name] ([id number]) [id number] Page 4 of 9 -------------------------------------------------------------------------------- Multi Wound Chart Details Patient Name: Date of Service: Gregory [name], [place] RD [name] 04/10/2022 9:00 A M Medical Record Number: [id number] Patient Account  Number: [id number] Date of Birth/Sex: Treating RN: 06-12-1950 (72 y.o. M) Primary Care Ronique Simerly: Gregory [name] Other Clinician: Referring General Wearing: Treating Rilyn Upshaw/Extender: Gregory [name], Gregory [name] in Treatment: 0 Vital Signs Height(in): 66 Pulse(bpm): 74 Weight(lbs): 225 Blood Pressure(mmHg): 154/77 Body Mass Index(BMI): 36.3 Temperature(F): 97.9 Respiratory Rate(breaths/min): 18 [1:Photos:] [N/A:N/A] Right, Lateral Lower Leg N/A N/A Wound Location: Trauma N/A N/A Wounding Event: Lymphedema N/A N/A Primary Etiology: Cataracts, Anemia, Arrhythmia, N/A N/A Comorbid History: Coronary Artery Disease, Hypertension, Myocardial Infarction, Peripheral Arterial Disease, Gout, Osteoarthritis, Confinement Anxiety 03/05/2022 N/A N/A Date Acquired: 0 N/A N/A [name] of Treatment: Open N/A N/A Wound Status: No N/A N/A Wound Recurrence: 1.9x1.7x0.1 N/A N/A Measurements L x W x D (cm) 2.537 N/A N/A A (cm) : rea 0.254 N/A N/A Volume (cm) : Full Thickness Without Exposed N/A N/A Classification: Support Structures Medium N/A N/A Exudate A mount: Serosanguineous N/A N/A  Exudate Type: red, brown N/A N/A Exudate Color: Flat and Intact N/A N/A Wound Margin: Small (1-33%) N/A N/A Granulation A mount: Red N/A N/A Granulation Quality: Large (67-100%) N/A N/A Necrotic A mount: Fat Layer (Subcutaneous Tissue): Yes N/A N/A Exposed Structures: Fascia: No Tendon: No Muscle: No Joint: No Bone: No Small (1-33%) N/A N/A Epithelialization: Debridement - Excisional N/A N/A Debridement: Pre-procedure Verification/Time Out 09:35 N/A N/A Taken: Lidocaine 4% Topical Solution N/A N/A Pain Control: Subcutaneous, Slough N/A N/A Tissue Debrided: Skin/Subcutaneous Tissue N/A N/A Level: 3.23 N/A N/A Debridement A (sq cm): rea Curette N/A N/A Instrument: Minimum N/A N/A Bleeding: Pressure N/A N/A Hemostasis A chieved: 3 N/A N/A Procedural Pain: 1 N/A N/A Post  Procedural Pain: Procedure was tolerated well N/A N/A Debridement Treatment Response: 1.9x1.7x0.2 N/A N/A Post Debridement Measurements L x W x D (cm) 0.507 N/A N/A Post Debridement Volume: (cm) No Abnormalities Noted N/A N/A Periwound Skin Texture: Hemosiderin Staining: Yes N/A N/A Periwound Skin Color: No Abnormality N/A N/A Temperature: Yes N/A N/A Tenderness on Palpation: Compression Therapy N/A N/A Procedures Performed: Gregory BankerKILFOYLE, Gregory [name] ([id number]) [id number] Page 5 of 9 Debridement Treatment Notes Wound #1 (Lower Leg) Wound Laterality: Right, Lateral Cleanser Peri-Wound Care Sween Lotion (Moisturizing lotion) Discharge Instruction: Apply moisturizing lotion as directed Topical Primary Dressing Maxorb Extra Ag+ Alginate Dressing, 2x2 (in/in) Discharge Instruction: Apply to wound bed as instructed Secondary Dressing Woven Gauze Sponge, Non-Sterile 4x4 in Discharge Instruction: Apply over primary dressing as directed. Secured With Compression Wrap Urgo K2 Lite, two layer compression system, regular Discharge Instruction: Apply Urgo K2 Lite as directed (alternative to 3 layer compression). Compression Stockings Add-Ons Electronic Signature(s) Signed: 04/10/2022 10:05:03 AM By: Duanne Guessannon, Jennifer MD FACS Previous Signature: 04/10/2022 9:21:40 AM Version By: Duanne Guessannon, Jennifer MD FACS Entered By: Duanne Guessannon, Jennifer on 04/10/2022 10:05:03 -------------------------------------------------------------------------------- Multi-Disciplinary Care Plan Details Patient Name: Date of Service: Gregory HenriKILFO YLE, Gregory RD [name] 04/10/2022 9:00 A M Medical Record Number: [id number] Patient Account Number: [id number] Date of Birth/Sex: Treating RN: [birth year] [age] y.o. Gregory SchoonerM) Gregory [name], Gregory [name] Primary Care Lynden Flemmer: Gregory NoraBedsole, Gregory Other Clinician: Referring Orien Mayhall: Treating Nyleah Mcginnis/Extender: Gregory Aguasannon, Jennifer Bedsole, Gregory [name] in Treatment: 0 Multidisciplinary Care Plan  reviewed with physician Active Inactive Venous Leg Ulcer Nursing Diagnoses: Actual venous Insuffiency (use after diagnosis is confirmed) Knowledge deficit related to disease process and management Goals: Patient will maintain optimal edema control Date Initiated: 04/10/2022 Target Resolution Date: 05/08/2022 Goal Status: Active Interventions: Assess peripheral edema status every visit. Compression as ordered Treatment Activities: Therapeutic compression applied : 04/10/2022 Notes: Wound/Skin Impairment Gregory BankerKILFOYLE, Gregory [name] ([id number]) [id number] Page 6 of 9 Nursing Diagnoses: Impaired tissue integrity Knowledge deficit related to ulceration/compromised skin integrity Goals: Patient/caregiver will verbalize understanding of skin care regimen Date Initiated: 04/10/2022 Target Resolution Date: 05/08/2022 Goal Status: Active Ulcer/skin breakdown will have a volume reduction of 30% by week 4 Date Initiated: 04/10/2022 Target Resolution Date: 05/08/2022 Goal Status: Active Interventions: Assess patient/caregiver ability to obtain necessary supplies Assess patient/caregiver ability to perform ulcer/skin care regimen upon admission and as needed Assess ulceration(s) every visit Provide education on ulcer and skin care Treatment Activities: Skin care regimen initiated : 04/10/2022 Topical wound management initiated : 04/10/2022 Notes: Electronic Signature(s) Signed: 04/11/2022 5:09:22 PM By: Zenaida DeedBoehlein, Linda RN, BSN Entered By: Zenaida DeedBoehlein, Gregory [name] on 04/10/2022 09:38:26 -------------------------------------------------------------------------------- Pain Assessment Details Patient Name: Date of Service: Gregory HenriKILFO YLE, Gregory RD [name] 04/10/2022 9:00 A M Medical Record Number: [id number] Patient Account Number: [id number] Date of Birth/Sex: Treating RN: [birth year] [age] y.o. Gregory SchoonerM) Gregory [name], Gregory [name] Primary Care  Brewer Hitchman: Gregory [name] Other Clinician: Referring Nayara Taplin: Treating  Derrel Moore/Extender: Gregory [name], Gregory [name] in Treatment: 0 Active Problems Location of Pain Severity and Description of Pain Patient Has Paino No Site Locations With Dressing Change: No Duration of the Pain. Constant / Intermittento Intermittent Rate the pain. Current Pain Level: 0 Worst Pain Level: 2 Character of Pain Describe the Pain: Tender Pain Management and Medication Current Pain Management: Is the Current Pain Management Adequate: Adequate How does your wound impact your activities of daily livingo Sleep: No Bathing: No Appetite: No Relationship With Others: No Gregory [name], Gregory [name] ([id number]) [id number] Page 7 of 9 Bladder Continence: No Emotions: No Bowel Continence: No Work: No Toileting: No Drive: No Dressing: No Hobbies: No Electronic Signature(s) Signed: 04/11/2022 5:09:22 PM By: [name] RN, BSN Entered By: [name] on 04/10/2022 09:21:42 -------------------------------------------------------------------------------- Patient/Caregiver Education Details Patient Name: Date of Service: Gregory [name], Gregory RD [name] 4/9/2024andnbsp9:00 A M Medical Record Number: [id number] Patient Account Number: [id number] Date of Birth/Gender: Treating RN: 09/30/1950 (72 y.o. Gregory [name] Primary Care Physician: Gregory [name] Other Clinician: Referring Physician: Treating Physician/Extender: Gregory [name], Gregory [name] in Treatment: 0 Education Assessment Education Provided To: Patient Education Topics Provided Venous: Methods: Explain/Verbal Responses: Reinforcements needed, State content correctly Wound/Skin Impairment: Methods: Explain/Verbal Responses: Reinforcements needed, State content correctly Electronic Signature(s) Signed: 04/11/2022 5:09:22 PM By: [name] RN, BSN Entered By: [name] on 04/10/2022  09:38:46 -------------------------------------------------------------------------------- Wound Assessment Details Patient Name: Date of Service: Gregory [name], Gregory RD [name] 04/10/2022 9:00 A M Medical Record Number: [id number] Patient Account Number: [id number] Date of Birth/Sex: Treating RN: 1950/12/13 (72 y.o. Gregory [name] Primary Care Wah Sabic: Gregory [name] Other Clinician: Referring Lyndsee Casa: Treating Rolonda Pontarelli/Extender: Gregory [name], Gregory [name] in Treatment: 0 Wound Status Wound Number: 1 Primary Lymphedema Etiology: Wound Location: Right, Lateral Lower Leg Wound Open Wounding Event: Trauma Status: Date Acquired: 03/05/2022 Comorbid Cataracts, Anemia, Arrhythmia, Coronary Artery Disease, [name] Of Treatment: 0 History: Hypertension, Myocardial Infarction, Peripheral Arterial Disease, Clustered Wound: No Gout, Osteoarthritis, Confinement Anxiety Photos Gregory [name], Gregory [name] ([id number]) [id number] Page 8 of 9 Wound Measurements Length: (cm) 1.9 Width: (cm) 1.7 Depth: (cm) 0.1 Area: (cm) 2.537 Volume: (cm) 0.254 % Reduction in Area: % Reduction in Volume: Epithelialization: Small (1-33%) Tunneling: No Undermining: No Wound Description Classification: Full Thickness Without Exposed Support Structures Wound Margin: Flat and Intact Exudate Amount: Medium Exudate Type: Serosanguineous Exudate Color: red, brown Foul Odor After Cleansing: No Slough/Fibrino Yes Wound Bed Granulation Amount: Small (1-33%) Exposed Structure Granulation Quality: Red Fascia Exposed: No Necrotic Amount: Large (67-100%) Fat Layer (Subcutaneous Tissue) Exposed: Yes Necrotic Quality: Adherent Slough Tendon Exposed: No Muscle Exposed: No Joint Exposed: No Bone Exposed: No Periwound Skin Texture Texture Color No Abnormalities Noted: Yes No Abnormalities Noted: No Hemosiderin Staining: Yes Moisture No Abnormalities Noted: No Temperature / Pain Temperature:  No Abnormality Tenderness on Palpation: Yes Electronic Signature(s) Signed: 04/11/2022 5:09:22 PM By: [name] RN, BSN Entered By: [name] on 04/10/2022 09:19:30 -------------------------------------------------------------------------------- Vitals Details Patient Name: Date of Service: Gregory [name], Gregory RD [name] 04/10/2022 9:00 A M Medical Record Number: [id number] Patient Account Number: [id number] Date of Birth/Sex: Treating RN: 1950-07-20 (72 y.o. Gregory [name] Primary Care Finnis Colee: Gregory [name] Other Clinician: Referring Athanasia Stanwood: Treating Audi Conover/Extender: Gregory [name], Gregory [name] in Treatment: 0 Vital Signs Time Taken: 09:04 Temperature (F): 97.9 Height (in): 66 Pulse (bpm): 74 Source: Stated Respiratory Rate (breaths/min): 18 Weight (lbs): 225 Blood Pressure (mmHg): 154/77 Source: Stated Reference Range: 80 -  120 mg / dl Body Mass Index (BMI): 36.3 Electronic Signature(s) Signed: 04/11/2022 5:09:22 PM By: [name] RN, BSN Entered By: [name] on 04/10/2022 09:05:56 Gregory [name] ([id number]) [id number] Page 9 of 9

## 2022-04-11 ENCOUNTER — Other Ambulatory Visit: Payer: Self-pay | Admitting: Family Medicine

## 2022-04-11 ENCOUNTER — Telehealth: Payer: Self-pay | Admitting: *Deleted

## 2022-04-11 DIAGNOSIS — E7800 Pure hypercholesterolemia, unspecified: Secondary | ICD-10-CM

## 2022-04-11 DIAGNOSIS — F411 Generalized anxiety disorder: Secondary | ICD-10-CM

## 2022-04-11 NOTE — Telephone Encounter (Signed)
-----   Message from Alvina Chou sent at 04/11/2022  2:16 PM EDT ----- Regarding: Lab orders for Thursday, 4.25.24 Patient is scheduled for CPX labs, please order future labs, Thanks , Camelia Eng

## 2022-04-11 NOTE — Telephone Encounter (Signed)
Patient has been scheduled

## 2022-04-11 NOTE — Telephone Encounter (Signed)
Please call and schedule CPE with fasting labs prior with Dr. Ermalene Searing after 04/28/22.  He already had his MWV.

## 2022-04-12 NOTE — Progress Notes (Signed)
SEBASTIAN, LURZ ([id number]) [id number] Page 1 of 9 Visit Report for 04/10/2022 Chief Complaint Document Details Patient Name: Date of Service: Gregory [name], [place] RD [name] 04/10/2022 9:00 A M Medical Record Number: [id number] Patient Account Number: [id number] Date of Birth/Sex: Treating RN: 1950/02/08 (72 y.o. M) Primary Care Provider: [name] Other Clinician: Referring Provider: Treating Provider/Extender: [name], [name] in Treatment: 0 Information Obtained from: Patient Chief Complaint Patient seen for complaints of Non-Healing Wound. Electronic Signature(s) Signed: 04/10/2022 10:05:18 AM By: Duanne Guess MD FACS Previous Signature: 04/10/2022 9:21:50 AM Version By: Duanne Guess MD FACS Entered By: Duanne Guess on 04/10/2022 10:05:18 -------------------------------------------------------------------------------- Debridement Details Patient Name: Date of Service: Gregory [name], EDWA RD [name] 04/10/2022 9:00 A M Medical Record Number: [id number] Patient Account Number: [id number] Date of Birth/Sex: Treating RN: 09-02-1950 (71 y.o. Gregory [name] Primary Care Provider: [name] Other Clinician: Referring Provider: Treating Provider/Extender: [name], [name] in Treatment: 0 Debridement Performed for Assessment: Wound #1 Right,Lateral Lower Leg Performed By: Physician Duanne Guess, MD Debridement Type: Debridement Severity of Tissue Pre Debridement: Fat layer exposed Level of Consciousness (Pre-procedure): Awake and Alert Pre-procedure Verification/Time Out Yes - 09:35 Taken: Start Time: 09:37 Pain Control: Lidocaine 4% T opical Solution T Area Debrided (L x W): otal 1.9 (cm) x 1.7 (cm) = 3.23 (cm) Tissue and other material debrided: Viable, Non-Viable, Slough, Subcutaneous, Slough Level: Skin/Subcutaneous Tissue Debridement Description: Excisional Instrument: Curette Bleeding: Minimum Hemostasis  Achieved: Pressure Procedural Pain: 3 Post Procedural Pain: 1 Response to Treatment: Procedure was tolerated well Level of Consciousness (Post- Awake and Alert procedure): Post Debridement Measurements of Total Wound Length: (cm) 1.9 Width: (cm) 1.7 Depth: (cm) 0.2 Volume: (cm) 0.507 Character of Wound/Ulcer Post Debridement: Improved Severity of Tissue Post Debridement: Fat layer exposed Post Procedure Diagnosis Same as Pre-procedure Notes scribed for Dr. Lady Gary by [name], RN Electronic Signature(s) Signed: 04/10/2022 12:21:18 PM By: Duanne Guess MD FACS Gregory [name] ([id number]) [id number] Page 2 of 9 Signed: 04/11/2022 5:09:22 PM By: [name] RN, BSN Entered By: [name] on 04/10/2022 10:05:01 -------------------------------------------------------------------------------- HPI Details Patient Name: Date of Service: Gregory [name], EDWA RD [name] 04/10/2022 9:00 A M Medical Record Number: [id number] Patient Account Number: [id number] Date of Birth/Sex: Treating RN: [birth year] (72 y.o. M) Primary Care Provider: [name] Other Clinician: Referring Provider: Treating Provider/Extender: [name], [name] in Treatment: 0 History of Present Illness HPI Description: ADMISSION 04/10/2022 This is a 72 year old man with a past medical history significant for hypertension, severe aortic insufficiency status post aortic valvuloplasty, grade 1 diastolic dysfunction, peripheral vascular disease, permanent pacemaker, lymphedema, and coronary artery disease. He dropped a cinder block which struck his leg at the beginning of March 2024. He sought care in the ED on March 4. Plain films did not demonstrate any fracture or foreign body. Steri-Strips were applied. Apparently the patient was soaking the wound with hydrogen peroxide daily. He saw his PCP who gave him an IM injection of ceftriaxone and prescribed doxycycline. He was  advised to elevate his legs and clean with just warm soapy water and apply antibiotic ointment. Formal ABIs were done in February 2024 that showed noncompressible ABIs bilaterally and a TBI of 0.6 on the right and 0.59 on the left. There is a triangular wound on his left lower leg, just lateral to the anterior tibial surface. There is slough and nonviable fat present. No concern for infection. Electronic Signature(s) Signed: 04/10/2022 10:06:42 AM By: Duanne Guess MD FACS Previous  Signature: 04/10/2022 9:43:13 AM Version By: Duanne Guess MD FACS Entered By: Duanne Guess on 04/10/2022 10:06:42 -------------------------------------------------------------------------------- Physical Exam Details Patient Name: Date of Service: Gregory [name], EDWA RD [name] 04/10/2022 9:00 A M Medical Record Number: [id number] Patient Account Number: [id number] Date of Birth/Sex: Treating RN: 03-29-1950 (72 y.o. M) Primary Care Provider: [name] Other Clinician: Referring Provider: Treating Provider/Extender: [name], [name] in Treatment: 0 Constitutional Hypertensive, asymptomatic. . . . No acute distress. Respiratory Normal work of breathing on room air. Cardiovascular . Notes 04/10/2022: There is a triangular wound on his left lower leg, just lateral to the anterior tibial surface. There is slough and nonviable fat present. No concern for infection. Electronic Signature(s) Signed: 04/10/2022 10:09:33 AM By: Duanne Guess MD FACS Entered By: Duanne Guess on 04/10/2022 10:09:33 -------------------------------------------------------------------------------- Physician Orders Details Patient Name: Date of Service: Gregory [name], EDWA RD [name] 04/10/2022 9:00 A M Medical Record Number: [id number] Patient Account Number: [id number] Date of Birth/Sex: Treating RN: [birth year] (72 y.o. Gregory [name] Primary Care Provider: [name] Other Clinician: Referring Provider: Treating  Provider/Extender: [name], [name] in Treatment: [address] Gregory [name], Gregory [name] ([id number]) [id number] Page 3 of 9 Verbal / Phone Orders: No Diagnosis Coding ICD-10 Coding Code Description [phone] Non-pressure chronic ulcer of other part of right lower leg with fat layer exposed I73.9 Peripheral vascular disease, unspecified I42.0 Dilated cardiomyopathy I35.1 Nonrheumatic aortic (valve) insufficiency I50.30 Unspecified diastolic (congestive) heart failure Follow-up Appointments ppointment in 1 week. - Dr. Lady Gary RM 1 Return A Monday 4/15 @ 0815 am Anesthetic Wound #1 Right,Lateral Lower Leg (In clinic) Topical Lidocaine 4% applied to wound bed Bathing/ Shower/ Hygiene May shower with protection but do not get wound dressing(s) wet. Protect dressing(s) with water repellant cover (for example, large plastic bag) or a cast cover and may then take shower. - may be purchased at [facility] or [facility] Edema Control - Lymphedema / SCD / Other Elevate legs to the level of the heart or above for 30 minutes daily and/or when sitting for 3-4 times a day throughout the day. - throughout the day Avoid standing for long periods of time. Exercise regularly Compression stocking or Garment 20-30 mm/Hg pressure to: - left leg daily Wound Treatment Wound #1 - Lower Leg Wound Laterality: Right, Lateral Peri-Wound Care: Sween Lotion (Moisturizing lotion) 1 x Per Week/30 Days Discharge Instructions: Apply moisturizing lotion as directed Prim Dressing: Maxorb Extra Ag+ Alginate Dressing, 2x2 (in/in) 1 x Per Week/30 Days ary Discharge Instructions: Apply to wound bed as instructed Secondary Dressing: Woven Gauze Sponge, Non-Sterile 4x4 in 1 x Per Week/30 Days Discharge Instructions: Apply over primary dressing as directed. Compression Wrap: Urgo K2 Lite, two layer compression system, regular 1 x Per Week/30 Days Discharge Instructions: Apply Urgo K2 Lite as directed  (alternative to 3 layer compression). Patient Medications llergies: heparin A Notifications Medication Indication Start End prior to debridement 04/10/2022 lidocaine DOSE topical 4 % cream - cream topical Electronic Signature(s) Signed: 04/10/2022 12:21:18 PM By: Duanne Guess MD FACS Entered By: Duanne Guess on 04/10/2022 10:09:47 -------------------------------------------------------------------------------- Problem List Details Patient Name: Date of Service: Gregory [name], EDWA RD [name] 04/10/2022 9:00 A M Medical Record Number: [id number] Patient Account Number: [id number] Date of Birth/Sex: Treating RN: 11-Aug-1950 (72 y.o. M) Primary Care Provider: [name] Other Clinician: Referring Provider: Treating Provider/Extender: [name], [name] in Treatment: [address] ADO, GORELIK ([id number]) [id number] Page 4 of 9 ICD-10 Encounter Code Description Active Date MDM Diagnosis L97.812 Non-pressure  chronic ulcer of other part of right lower leg with fat layer 04/10/2022 No Yes exposed I87.2 Venous insufficiency (chronic) (peripheral) 04/10/2022 No Yes I73.9 Peripheral vascular disease, unspecified 04/10/2022 No Yes I42.0 Dilated cardiomyopathy 04/10/2022 No Yes I35.1 Nonrheumatic aortic (valve) insufficiency 04/10/2022 No Yes I50.30 Unspecified diastolic (congestive) heart failure 04/10/2022 No Yes Inactive Problems Resolved Problems Electronic Signature(s) Signed: 04/10/2022 10:04:55 AM By: Duanne Guessannon, Karah Caruthers MD FACS Previous Signature: 04/10/2022 9:21:27 AM Version By: Duanne Guessannon, Moe Graca MD FACS Entered By: Duanne Guessannon, Anjelica Gorniak on 04/10/2022 10:04:55 -------------------------------------------------------------------------------- Progress Note Details Patient Name: Date of Service: Gregory HenriKILFO YLE, EDWA RD [name] 04/10/2022 9:00 A M Medical Record Number: [id number] Patient Account Number: [id number] Date of Birth/Sex: Treating RN: [birth year](71 y.o.  M) Primary Care Provider: Kerby NoraBedsole, Amy Other Clinician: Referring Provider: Treating Provider/Extender: Theodis Aguasannon, Crue Otero Bedsole, [name] in Treatment: 0 Subjective Chief Complaint Information obtained from Patient Patient seen for complaints of Non-Healing Wound. History of Present Illness (HPI) ADMISSION 04/10/2022 This is a 72 year old man with a past medical history significant for hypertension, severe aortic insufficiency status post aortic valvuloplasty, grade 1 diastolic dysfunction, peripheral vascular disease, permanent pacemaker, lymphedema, and coronary artery disease. He dropped a cinder block which struck his leg at the beginning of March 2024. He sought care in the ED on March 4. Plain films did not demonstrate any fracture or foreign body. Steri-Strips were applied. Apparently the patient was soaking the wound with hydrogen peroxide daily. He saw his PCP who gave him an IM injection of ceftriaxone and prescribed doxycycline. He was advised to elevate his legs and clean with just warm soapy water and apply antibiotic ointment. Formal ABIs were done in February 2024 that showed noncompressible ABIs bilaterally and a TBI of 0.6 on the right and 0.59 on the left. There is a triangular wound on his left lower leg, just lateral to the anterior tibial surface. There is slough and nonviable fat present. No concern for infection. Patient History Information obtained from Patient, Chart. Allergies heparin (Reaction: HIT) Family History Unknown History. Gregory BankerKILFOYLE, Gregory [name] ([id number]) [id number] Page 5 of 9 Social History Never smoker, Marital Status - Married, Alcohol Use - Moderate, Drug Use - Current History - occaisional TCH, Caffeine Use - Moderate. Medical History Eyes Patient has history of Cataracts - bil extractions Ear/Nose/Mouth/Throat Denies history of Chronic sinus problems/congestion, Middle ear problems Hematologic/Lymphatic Patient  has history of Anemia - macrocytic Cardiovascular Patient has history of Arrhythmia - afib, complete heart block, Coronary Artery Disease, Hypertension, Myocardial Infarction, Peripheral Arterial Disease Endocrine Denies history of Type I Diabetes, Type II Diabetes Genitourinary Denies history of End Stage Renal Disease Integumentary (Skin) Denies history of History of Burn Musculoskeletal Patient has history of Gout, Osteoarthritis Denies history of Rheumatoid Arthritis, Osteomyelitis Oncologic Denies history of Received Chemotherapy, Received Radiation Psychiatric Patient has history of Confinement Anxiety Denies history of Anorexia/bulimia Hospitalization/Surgery History - pacemaker insertion 10/2020. - balloon aortic valve valvuloplasty. - CABG 03/2020. - aortic valve replacement. - cataract extraction. Medical A Surgical History Notes nd Cardiovascular aortic insufficiency, cardiomyopathy, pacemaker Review of Systems (ROS) Constitutional Symptoms (General Health) Denies complaints or symptoms of Fatigue, Fever, Chills, Marked Weight Change. Eyes Complains or has symptoms of Glasses / Contacts. Ear/Nose/Mouth/Throat Denies complaints or symptoms of Chronic sinus problems or rhinitis. Respiratory Denies complaints or symptoms of Chronic or frequent coughs, Shortness of Breath. Cardiovascular Denies complaints or symptoms of Chest pain. Gastrointestinal Denies complaints or symptoms of Frequent diarrhea, Nausea, Vomiting. Endocrine Denies complaints or symptoms of Heat/cold intolerance. Genitourinary Denies  complaints or symptoms of Frequent urination. Integumentary (Skin) Complains or has symptoms of Wounds - right lower leg. Musculoskeletal Denies complaints or symptoms of Muscle Pain, Muscle Weakness. Neurologic Denies complaints or symptoms of Numbness/parasthesias. Psychiatric Complains or has symptoms of Claustrophobia. Objective Constitutional Hypertensive,  asymptomatic. No acute distress. Vitals Time Taken: 9:04 AM, Height: 66 in, Source: Stated, Weight: 225 lbs, Source: Stated, BMI: 36.3, Temperature: 97.9 F, Pulse: 74 bpm, Respiratory Rate: 18 breaths/min, Blood Pressure: 154/77 mmHg. Respiratory Normal work of breathing on room air. General Notes: 04/10/2022: There is a triangular wound on his left lower leg, just lateral to the anterior tibial surface. There is slough and nonviable fat present. No concern for infection. Integumentary (Hair, Skin) Wound #1 status is Open. Original cause of wound was Trauma. The date acquired was: 03/05/2022. The wound is located on the Right,Lateral Lower Leg. The wound measures 1.9cm length x 1.7cm width x 0.1cm depth; 2.537cm^2 area and 0.254cm^3 volume. There is Fat Layer (Subcutaneous Tissue) exposed. There is no tunneling or undermining noted. There is a medium amount of serosanguineous drainage noted. The wound margin is flat and intact. There is small (1-33%) red granulation within the wound bed. There is a large (67-100%) amount of necrotic tissue within the wound bed including Adherent Slough. The periwound skin appearance had no abnormalities noted for texture. The periwound skin appearance exhibited: Hemosiderin Staining. Periwound temperature was Gregory [name], Gregory [name] ([id number]) [id number] Page 6 of 9 noted as No Abnormality. The periwound has tenderness on palpation. Assessment Active Problems ICD-10 Non-pressure chronic ulcer of other part of right lower leg with fat layer exposed Venous insufficiency (chronic) (peripheral) Peripheral vascular disease, unspecified Dilated cardiomyopathy Nonrheumatic aortic (valve) insufficiency Unspecified diastolic (congestive) heart failure Procedures Wound #1 Pre-procedure diagnosis of Wound #1 is a Venous Leg Ulcer located on the Right,Lateral Lower Leg .Severity of Tissue Pre Debridement is: Fat layer exposed. There was a  Excisional Skin/Subcutaneous Tissue Debridement with a total area of 3.23 sq cm performed by Duanne Guess, MD. With the following instrument(s): Curette to remove Viable and Non-Viable tissue/material. Material removed includes Subcutaneous Tissue and Slough and after achieving pain control using Lidocaine 4% T opical Solution. No specimens were taken. A time out was conducted at 09:35, prior to the start of the procedure. A Minimum amount of bleeding was controlled with Pressure. The procedure was tolerated well with a pain level of 3 throughout and a pain level of 1 following the procedure. Post Debridement Measurements: 1.9cm length x 1.7cm width x 0.2cm depth; 0.507cm^3 volume. Character of Wound/Ulcer Post Debridement is improved. Severity of Tissue Post Debridement is: Fat layer exposed. Post procedure Diagnosis Wound #1: Same as Pre-Procedure General Notes: scribed for Dr. Lady Gary by [name], RN. Pre-procedure diagnosis of Wound #1 is a Lymphedema located on the Right,Lateral Lower Leg . There was a Double Layer Compression Therapy Procedure by [name], RN. Post procedure Diagnosis Wound #1: Same as Pre-Procedure Notes: urgo lite. Plan Follow-up Appointments: Return Appointment in 1 week. - Dr. Lady Gary RM 1 Monday 4/15 @ 0815 am Anesthetic: Wound #1 Right,Lateral Lower Leg: (In clinic) Topical Lidocaine 4% applied to wound bed Bathing/ Shower/ Hygiene: May shower with protection but do not get wound dressing(s) wet. Protect dressing(s) with water repellant cover (for example, large plastic bag) or a cast cover and may then take shower. - may be purchased at [facility] or [facility] Edema Control - Lymphedema / SCD / Other: Elevate legs to the level of the heart or above  for 30 minutes daily and/or when sitting for 3-4 times a day throughout the day. - throughout the day Avoid standing for long periods of time. Exercise regularly Compression stocking or Garment 20-30 mm/Hg  pressure to: - left leg daily The following medication(s) was prescribed: lidocaine topical 4 % cream cream topical for prior to debridement was prescribed at facility WOUND #1: - Lower Leg Wound Laterality: Right, Lateral Peri-Wound Care: Sween Lotion (Moisturizing lotion) 1 x Per Week/30 Days Discharge Instructions: Apply moisturizing lotion as directed Prim Dressing: Maxorb Extra Ag+ Alginate Dressing, 2x2 (in/in) 1 x Per Week/30 Days ary Discharge Instructions: Apply to wound bed as instructed Secondary Dressing: Woven Gauze Sponge, Non-Sterile 4x4 in 1 x Per Week/30 Days Discharge Instructions: Apply over primary dressing as directed. Com pression Wrap: Urgo K2 Lite, two layer compression system, regular 1 x Per Week/30 Days Discharge Instructions: Apply Urgo K2 Lite as directed (alternative to 3 layer compression). 04/10/2022: This is a 72 year old man with an extensive cardiac history as well as peripheral vascular disease. He dropped a cinder block which struck his leg near the beginning of March. The wound has not yet healed. There is a triangular wound on his left lower leg, just lateral to the anterior tibial surface. There is slough and nonviable fat present. No concern for infection. I used a curette to debride slough, subcutaneous tissue, and nonviable fat from the wound. We will apply silver alginate and 3 layer compression. We discussed the importance of elevation and adequate protein intake. He will follow-up in 1 week. Electronic Signature(s) Signed: 04/10/2022 10:11:41 AM By: Duanne Guess MD FACS Gregory [name] ([id number]) [id number] Page 7 of 9 Entered By: Duanne Guess on 04/10/2022 10:11:41 -------------------------------------------------------------------------------- HxROS Details Patient Name: Date of Service: Gregory [name], [place] RD [name] 04/10/2022 9:00 A M Medical Record Number: [id number] Patient Account Number: [id number] Date of  Birth/Sex: Treating RN: 05/23/1950 (72 y.o. Gregory [name] Primary Care Provider: [name] Other Clinician: Referring Provider: Treating Provider/Extender: [name], [name] in Treatment: 0 Information Obtained From Patient Chart Constitutional Symptoms (General Health) Complaints and Symptoms: Negative for: Fatigue; Fever; Chills; Marked Weight Change Eyes Complaints and Symptoms: Positive for: Glasses / Contacts Medical History: Positive for: Cataracts - bil extractions Ear/Nose/Mouth/Throat Complaints and Symptoms: Negative for: Chronic sinus problems or rhinitis Medical History: Negative for: Chronic sinus problems/congestion; Middle ear problems Respiratory Complaints and Symptoms: Negative for: Chronic or frequent coughs; Shortness of Breath Cardiovascular Complaints and Symptoms: Negative for: Chest pain Medical History: Positive for: Arrhythmia - afib, complete heart block; Coronary Artery Disease; Hypertension; Myocardial Infarction; Peripheral Arterial Disease Past Medical History Notes: aortic insufficiency, cardiomyopathy, pacemaker Gastrointestinal Complaints and Symptoms: Negative for: Frequent diarrhea; Nausea; Vomiting Endocrine Complaints and Symptoms: Negative for: Heat/cold intolerance Medical History: Negative for: Type I Diabetes; Type II Diabetes Genitourinary Complaints and Symptoms: Negative for: Frequent urination Medical History: Negative for: End Stage Renal Disease Integumentary (Skin) Complaints and Symptoms: Positive for: Wounds - right lower leg Medical History: Negative for: History of Burn Gregory [name], Gregory [name] ([id number]) [id number] Page 8 of 9 Musculoskeletal Complaints and Symptoms: Negative for: Muscle Pain; Muscle Weakness Medical History: Positive for: Gout; Osteoarthritis Negative for: Rheumatoid Arthritis; Osteomyelitis Neurologic Complaints and Symptoms: Negative  for: Numbness/parasthesias Psychiatric Complaints and Symptoms: Positive for: Claustrophobia Medical History: Positive for: Confinement Anxiety Negative for: Anorexia/bulimia Hematologic/Lymphatic Medical History: Positive for: Anemia - macrocytic Immunological Oncologic Medical History: Negative for: Received Chemotherapy; Received Radiation HBO Extended History Items Eyes: Cataracts Immunizations Pneumococcal Vaccine: Received  Pneumococcal Vaccination: Yes Received Pneumococcal Vaccination On or After 60th Birthday: Yes Implantable Devices Yes Hospitalization / Surgery History Type of Hospitalization/Surgery pacemaker insertion 10/2020 balloon aortic valve valvuloplasty CABG 03/2020 aortic valve replacement cataract extraction Family and Social History Unknown History: Yes; Never smoker; Marital Status - Married; Alcohol Use: Moderate; Drug Use: Current History - occaisional TCH; Caffeine Use: Moderate; Financial Concerns: No; Food, Clothing or Shelter Needs: No; Support System Lacking: No; Transportation Concerns: No Psychologist, prison and probation services) Signed: 04/10/2022 12:21:18 PM By: Duanne Guess MD FACS Signed: 04/11/2022 5:09:22 PM By: [name] RN, BSN Entered By: [name] on 04/10/2022 09:11:57 -------------------------------------------------------------------------------- SuperBill Details Patient Name: Date of Service: Gregory [name], EDWA RD [name] 04/10/2022 Medical Record Number: [id number] Patient Account Number: [id number] Date of Birth/Sex: Treating RN: Oct 21, 1950 (72 y.o. Gregory [name] Primary Care Provider: [name] Other Clinician: Referring Provider: Treating Provider/Extender: Gregory [name], Gregory [name] ([id number]) [id number] Page 9 of 9 Weeks in Treatment: 0 Diagnosis Coding ICD-10 Codes Code Description [phone] Non-pressure chronic ulcer of other part of right lower leg with fat layer  exposed I73.9 Peripheral vascular disease, unspecified I42.0 Dilated cardiomyopathy I35.1 Nonrheumatic aortic (valve) insufficiency I50.30 Unspecified diastolic (congestive) heart failure Facility Procedures : CPT4 Code: [id number] Description: 99213 - WOUND CARE VISIT-LEV 3 EST PT Modifier: 25 Quantity: 1 : CPT4 Code: [id number] Description: 11042 - DEB SUBQ TISSUE 20 SQ CM/< ICD-10 Diagnosis Description L97.812 Non-pressure chronic ulcer of other part of right lower leg with fat layer expos Modifier: ed Quantity: 1 Physician Procedures : CPT4 Code Description Modifier [id number] 99204 - WC PHYS LEVEL 4 - NEW PT 25 ICD-10 Diagnosis Description L97.812 Non-pressure chronic ulcer of other part of right lower leg with fat layer exposed I73.9 Peripheral vascular disease, unspecified I50.30  Unspecified diastolic (congestive) heart failure I42.0 Dilated cardiomyopathy Quantity: 1 : [id number] - WC PHYS SUBQ TISS 20 SQ CM ICD-10 Diagnosis Description L97.812 Non-pressure chronic ulcer of other part of right lower leg with fat layer exposed Quantity: 1 Electronic Signature(s) Signed: 04/10/2022 10:12:06 AM By: Duanne Guess MD FACS Entered By: Duanne Guess on 04/10/2022 10:12:06

## 2022-04-12 NOTE — Progress Notes (Signed)
ELDEAN, GEAN ([id number]) [phone] [id number] Page 1 of 4 Visit Report for 04/10/2022 Abuse Risk Screen Details Patient Name: Date of Service: Gregory [name], [place] RD J. 04/10/2022 9:00 A M Medical Record Number: [id number] Patient Account Number: [id number] Date of Birth/Sex: Treating RN: 12-05-1950 (72 y.o. Gregory [name] Primary Care Aicha Clingenpeel: [name] Other Clinician: Referring Charmine Bockrath: Treating Garnett Rekowski/Extender: [name], [name] in Treatment: 0 Abuse Risk Screen Items Answer ABUSE RISK SCREEN: Has anyone close to you tried to hurt or harm you recentlyo No Do you feel uncomfortable with anyone in your familyo No Has anyone forced you do things that you didnt want to doo No Electronic Signature(s) Signed: 04/11/2022 5:09:22 PM By: [name] RN, BSN Entered By: [name] on 04/10/2022 09:12:08 -------------------------------------------------------------------------------- Activities of Daily Living Details Patient Name: Date of Service: Gregory [name], [name] 04/10/2022 9:00 A M Medical Record Number: [id number] Patient Account Number: [id number] Date of Birth/Sex: Treating RN: 22-Nov-1950 (72 y.o. Gregory [name] Primary Care Chaitra Mast: [name] Other Clinician: Referring Christyn Gutkowski: Treating Bode Pieper/Extender: [name], [name] in Treatment: 0 Activities of Daily Living Items Answer Activities of Daily Living (Please select one for each item) Drive Automobile Completely Able T Medications ake Completely Able Use T elephone Completely Able Care for Appearance Completely Able Use T oilet Completely Able Bath / Shower Completely Able Dress Self Completely Able Feed Self Completely Able Walk Completely Able Get In / Out Bed Completely Able Housework Completely Able Prepare Meals Completely Able Handle Money Completely Able Shop for Self Completely Able Electronic Signature(s) Signed:  04/11/2022 5:09:22 PM By: [name] RN, BSN Entered By: [name] on 04/10/2022 09:12:32 -------------------------------------------------------------------------------- Education Screening Details Patient Name: Date of Service: Gregory [name], [name] 04/10/2022 9:00 A M Medical Record Number: [id number] Patient Account Number: [id number] Date of Birth/Sex: Treating RN: December 27, 1950 (72 y.o. Gregory [name] Primary Care Lavena Loretto: [name] Other Clinician: Referring Catalina Salasar: Treating Aamilah Augenstein/Extender: [name], [name] in Treatment: [address] Gregory [name], Gregory [name] ([id number]) [phone] [id number] Page 2 of 4 Primary Learner Assessed: Patient Learning Preferences/Education Level/Primary Language Learning Preference: Explanation, Demonstration, Printed Material Highest Education Level: College or Above Preferred Language: Economist Language Barrier: No Translator Needed: No Memory Deficit: No Emotional Barrier: No Cultural/Religious Beliefs Affecting Medical Care: No Physical Barrier Impaired Vision: Yes Glasses Impaired Hearing: No Decreased Hand dexterity: No Knowledge/Comprehension Knowledge Level: High Comprehension Level: High Ability to understand written instructions: High Ability to understand verbal instructions: High Motivation Anxiety Level: Calm Cooperation: Cooperative Education Importance: Acknowledges Need Interest in Health Problems: Asks Questions Perception: Coherent Willingness to Engage in Self-Management High Activities: Readiness to Engage in Self-Management High Activities: Electronic Signature(s) Signed: 04/11/2022 5:09:22 PM By: [name] RN, BSN Entered By: [name] on 04/10/2022 09:12:54 -------------------------------------------------------------------------------- Fall Risk Assessment Details Patient Name: Date of Service: Gregory [name], [name] 04/10/2022 9:00 A  M Medical Record Number: [id number] Patient Account Number: [id number] Date of Birth/Sex: Treating RN: 07/26/1950 (72 y.o. Gregory [name] Primary Care Sequoyah Ramone: [name] Other Clinician: Referring Laryssa Hassing: Treating Tarhonda Hollenberg/Extender: [name], [name] in Treatment: 0 Fall Risk Assessment Items Have you had 2 or more falls in the last 12 monthso 0 No Have you had any fall that resulted in injury in the last 12 monthso 0 No FALLS RISK SCREEN History of falling - immediate or within 3 months 0 No Secondary diagnosis (Do you have 2 or more medical diagnoseso) 0 No Ambulatory aid None/bed  rest/wheelchair/nurse 0 No Crutches/cane/walker 15 Yes Furniture 0 No Intravenous therapy Access/Saline/Heparin Lock 0 No Gait/Transferring Normal/ bed rest/ wheelchair 0 Yes Weak (short steps with or without shuffle, stooped but able to lift head while walking, may seek 0 No support from furniture) Impaired (short steps with shuffle, may have difficulty arising from chair, head down, impaired 0 No balance) Mental Status Oriented to own ability 0 Yes Overestimates or forgets limitations 0 No Risk Level: Low Risk Score: 15 Gregory [name], Gregory [name] ([id number]) [phone] [id number] Page 3 of 4 Electronic Signature(s) -------------------------------------------------------------------------------- Foot Assessment Details Patient Name: Date of Service: Gregory [name], [place] RD J. 04/10/2022 9:00 A M Medical Record Number: [id number] Patient Account Number: [id number] Date of Birth/Sex: Treating RN: 02-18-1950 (72 y.o. Gregory [name] Primary Care Ifeoluwa Beller: [name] Other Clinician: Referring Josph Norfleet: Treating Channon Brougher/Extender: [name], [name] in Treatment: 0 Foot Assessment Items Site Locations + = Sensation present, - = Sensation absent, C = Callus, U = Ulcer R = Redness, W = Warmth, M = Maceration, PU = Pre-ulcerative lesion F =  Fissure, S = Swelling, D = Dryness Assessment Right: Left: Other Deformity: No No Prior Foot Ulcer: No No Prior Amputation: No No Charcot Joint: No No Ambulatory Status: Ambulatory Without Help Gait: Steady Electronic Signature(s) Signed: 04/11/2022 5:09:22 PM By: [name] RN, BSN Entered By: [name] on 04/10/2022 09:13:51 -------------------------------------------------------------------------------- Nutrition Risk Screening Details Patient Name: Date of Service: Gregory [name], [name] 04/10/2022 9:00 A M Medical Record Number: [id number] Patient Account Number: [id number] Date of Birth/Sex: Treating RN: 1950-04-16 (72 y.o. Gregory [name] Primary Care Honesti Seaberg: [name] Other Clinician: Referring Senaya Dicenso: Treating Keydi Giel/Extender: [name], [name] in Treatment: 0 Height (in): 66 Weight (lbs): 225 Body Mass Index (BMI): 36.3 Gregory [name], Gregory [name] ([id number]) [phone] [id number] Page 4 of 4 Nutrition Risk Screening Items Score Screening NUTRITION RISK SCREEN: I have an illness or condition that made me change the kind and/or amount of food I eat 0 No I eat fewer than two meals per day 0 No I eat few fruits and vegetables, or milk products 0 No I have three or more drinks of beer, liquor or wine almost every day 0 No I have tooth or mouth problems that make it hard for me to eat 0 No I don't always have enough money to buy the food I need 0 No I eat alone most of the time 0 No I take three or more different prescribed or over-the-counter drugs a day 1 Yes Without wanting to, I have lost or gained 10 pounds in the last six months 0 No I am not always physically able to shop, cook and/or feed myself 0 No Nutrition Protocols Good Risk Protocol 0 No interventions needed Moderate Risk Protocol High Risk Proctocol Risk Level: Good Risk Score: 1 Electronic Signature(s) Signed: 04/11/2022 5:09:22 PM By: [name] RN, BSN Entered By: [name] on 04/10/2022 09:13:40

## 2022-04-13 ENCOUNTER — Inpatient Hospital Stay (HOSPITAL_BASED_OUTPATIENT_CLINIC_OR_DEPARTMENT_OTHER): Payer: Medicare HMO | Admitting: Adult Health

## 2022-04-13 ENCOUNTER — Inpatient Hospital Stay: Payer: Medicare HMO

## 2022-04-13 ENCOUNTER — Inpatient Hospital Stay: Payer: Medicare HMO | Attending: Hematology and Oncology

## 2022-04-13 VITALS — BP 121/78 | HR 73 | Temp 97.9°F | Resp 16 | Ht 66.0 in | Wt 233.5 lb

## 2022-04-13 DIAGNOSIS — D649 Anemia, unspecified: Secondary | ICD-10-CM

## 2022-04-13 DIAGNOSIS — D539 Nutritional anemia, unspecified: Secondary | ICD-10-CM | POA: Diagnosis not present

## 2022-04-13 DIAGNOSIS — D509 Iron deficiency anemia, unspecified: Secondary | ICD-10-CM | POA: Insufficient documentation

## 2022-04-13 DIAGNOSIS — E538 Deficiency of other specified B group vitamins: Secondary | ICD-10-CM

## 2022-04-13 DIAGNOSIS — D638 Anemia in other chronic diseases classified elsewhere: Secondary | ICD-10-CM | POA: Insufficient documentation

## 2022-04-13 DIAGNOSIS — D519 Vitamin B12 deficiency anemia, unspecified: Secondary | ICD-10-CM | POA: Insufficient documentation

## 2022-04-13 LAB — CBC WITH DIFFERENTIAL (CANCER CENTER ONLY)
Abs Immature Granulocytes: 0.01 10*3/uL (ref 0.00–0.07)
Basophils Absolute: 0 10*3/uL (ref 0.0–0.1)
Basophils Relative: 1 %
Eosinophils Absolute: 0.1 10*3/uL (ref 0.0–0.5)
Eosinophils Relative: 2 %
HCT: 31.9 % — ABNORMAL LOW (ref 39.0–52.0)
Hemoglobin: 10.2 g/dL — ABNORMAL LOW (ref 13.0–17.0)
Immature Granulocytes: 0 %
Lymphocytes Relative: 28 %
Lymphs Abs: 1.2 10*3/uL (ref 0.7–4.0)
MCH: 31.8 pg (ref 26.0–34.0)
MCHC: 32 g/dL (ref 30.0–36.0)
MCV: 99.4 fL (ref 80.0–100.0)
Monocytes Absolute: 0.4 10*3/uL (ref 0.1–1.0)
Monocytes Relative: 9 %
Neutro Abs: 2.6 10*3/uL (ref 1.7–7.7)
Neutrophils Relative %: 60 %
Platelet Count: 216 10*3/uL (ref 150–400)
RBC: 3.21 MIL/uL — ABNORMAL LOW (ref 4.22–5.81)
RDW: 14.7 % (ref 11.5–15.5)
WBC Count: 4.3 10*3/uL (ref 4.0–10.5)
nRBC: 0 % (ref 0.0–0.2)

## 2022-04-13 LAB — SAMPLE TO BLOOD BANK

## 2022-04-13 MED ORDER — EPOETIN ALFA 40000 UNIT/ML IJ SOLN
40000.0000 [IU] | Freq: Once | INTRAMUSCULAR | Status: AC
  Administered 2022-04-13: 40000 [IU] via SUBCUTANEOUS
  Filled 2022-04-13: qty 1

## 2022-04-13 MED ORDER — CYANOCOBALAMIN 1000 MCG/ML IJ SOLN
1000.0000 ug | Freq: Once | INTRAMUSCULAR | Status: AC
  Administered 2022-04-13: 1000 ug via INTRAMUSCULAR
  Filled 2022-04-13: qty 1

## 2022-04-13 NOTE — Progress Notes (Unsigned)
[facility] Cancer Follow up:    Gregory Seltzer, MD [address] [place] [address]   DIAGNOSIS: chronic anemia  SUMMARY OF HEMATOLOGIC HISTORY: 09/29/2020: IV Iron daily x 4 11/17/2020: Hemoglobin 9.6, WBC 3.3, platelets 142, ANC 1.9, ferritin 158, iron saturation 31% (improv from 5%) 01/13/21: Hb 9.9, WBC: 5.5, MCV 105.6, Platelet 165, TB 1.3, Ferritin 50, Iron Sat: 25%,  01/16/2021: Hemoglobin 9.2, MCV 105.1, B12 117  01/12/2021: (start B12 injections) 02/13/2021: Hemoglobin 10, MCV 103.1,   06/20/2021: Hemoglobin 10, MCV 100.3  09/12/2021: Hemoglobin 11.1 01/02/2022: Hemoglobin 10.8  01/11/2022: Retacrit 40k Units weekly started 02/15/2022: Hemoglobin 10.7 (Retacrit changed to every 2 weeks) 03/15/2022: Hemoglobin 9.9, MCV 103.3 03/22/2022: Hemoglobin 9.6, MCV 101.7, Ferritin 22, Iron Sat 10%, Iron 37, TIBC 378, UIBC 241 (began oral iron) 4/12: hemoglobin 10.2, MCV 99.4  CURRENT THERAPY: b12, retacrit  INTERVAL HISTORY: Gregory [name] 72 y.o. male returns for follow-up of his chronic anemia.  He has been tolerating the B12 and Retacrit injections without any difficulty.  He did start oral iron a couple weeks ago and notes that he does not like taking an additional pill.  His surgery has been postponed because he has a small wound on his right lower extremity.  He denies any other significant issues today other than endorsing some mild fatigue.   Patient Active Problem List   Diagnosis Date Noted   Cellulitis and abscess of right leg 03/14/2022   Primary osteoarthritis of left knee 02/20/2022   Peripheral vascular disease 01/17/2022   S/P placement of cardiac pacemaker 04/20/2021   Vitamin B 12 deficiency 01/16/2021   Pre-op evaluation 01/05/2021   ICD (implantable cardioverter-defibrillator) in place    S/p aortic valvuloplasty 10/18/2020   Complete heart block 10/17/2020   Paravalvular leak (prosthetic valve) 10/10/2020   Severe aortic insufficiency  10/10/2020   Heart block AV complete    Iron deficiency anemia 09/22/2020   Skin tear of left lower leg without complication 07/19/2020   Chronic gout of multiple sites 03/18/2020   Dilated cardiomyopathy 03/13/2020   Dyslipidemia 03/13/2020   Coronary artery disease involving native coronary artery of native heart without angina pectoris 12/12/2019   Pain due to onychomycosis of toenails of both feet 04/23/2019   S/P CABG x 3 04/15/2019   GAD (generalized anxiety disorder) 03/31/2019   Seborrheic dermatitis 03/31/2019   History of heparin-induced thrombocytopenia 03/23/2019   Long term (current) use of anticoagulants 03/23/2019   Atrial fibrillation 03/23/2019   S/P aortic valve replacement 03/06/2019   Prediabetes 11/18/2018   Vitamin D deficiency 09/30/2018   Lymphedema 01/13/2018   Grade I diastolic dysfunction 01/09/2018   Situational anxiety 10/30/2016   Bilateral primary osteoarthritis of knee 09/23/2015   Macrocytic anemia 08/06/2013   Class 1 drug-induced obesity with serious comorbidity and body mass index (BMI) of 34.0 to 34.9 in adult 03/26/2011   Aortic insufficiency 02/15/2011   Essential hypertension 10/09/2006    is allergic to heparin.  MEDICAL HISTORY: Past Medical History:  Diagnosis Date   Alcohol abuse    Anemia    Anxiety    Aortic valve disease    Arthritis    CHF (congestive heart failure) (HCC)    Complication of anesthesia    slow to wake up and disoriented after [facility]- 2021 at [facility]   Coronary artery disease    Depression    Glaucoma    Heart murmur    Hypertension    Myocardial  infarction [facility])    Peripheral vascular disease (HCC)    Pre-diabetes    Presence of permanent cardiac pacemaker    S/P balloon aortic valvuloplasty 10/18/2020   done for bioprosthetic aortic valve replacement perivalvular leaking reducing it from severe to mild.   Shingles     SURGICAL HISTORY: Past Surgical History:  Procedure Laterality Date   AORTIC ARCH  ANGIOGRAPHY N/A 10/10/2020   Procedure: AORTIC ARCH ANGIOGRAPHY;  Surgeon: Yvonne Kendall, MD;  Location: [facility] INVASIVE CV LAB;  Service: Cardiovascular;  Laterality: N/A;   AORTIC VALVE REPLACEMENT N/A 03/06/2019   Procedure: AORTIC VALVE REPLACEMENT (AVR), USING INTUITY ;  Surgeon: Linden Dolin, MD;  Location: [facility] OR;  Service: Open Heart Surgery;  Laterality: N/A;   BALLOON AORTIC VALVE VALVULOPLASTY N/A 10/18/2020   Procedure: BALLOON AORTIC VALVE VALVULOPLASTY;  Surgeon: Tonny Bollman, MD;  Location: [facility] OR;  Service: Cardiovascular;  Laterality: N/A;   BIV PACEMAKER INSERTION CRT-P N/A 10/20/2020   Procedure: BIV PACEMAKER INSERTION CRT-P;  Surgeon: Lanier Prude, MD;  Location: [facility] INVASIVE CV LAB;  Service: Cardiovascular;  Laterality: N/A;   CARDIAC CATHETERIZATION     CATARACT EXTRACTION     CORONARY ARTERY BYPASS GRAFT N/A 03/06/2019   Procedure: CORONARY ARTERY BYPASS GRAFTING (CABG), ON PUMP, TIMES THREE, USING BILATERAL INTERNAL MAMMARIES AND LEFT RADIAL ARTERY HARVEST;  Surgeon: Linden Dolin, MD;  Location: [facility] OR;  Service: Open Heart Surgery;  Laterality: N/A;  BILATERAL IMA   LEFT HEART CATH AND CORONARY ANGIOGRAPHY N/A 03/04/2019   Procedure: LEFT HEART CATH AND CORONARY ANGIOGRAPHY;  Surgeon: Kathleene Hazel, MD;  Location: [facility] INVASIVE CV LAB;  Service: Cardiovascular;  Laterality: N/A;   None     RADIAL ARTERY HARVEST Left 03/06/2019   Procedure: RADIAL ARTERY HARVEST;  Surgeon: Linden Dolin, MD;  Location: [facility] OR;  Service: Open Heart Surgery;  Laterality: Left;   RIGHT HEART CATH AND CORONARY/GRAFT ANGIOGRAPHY N/A 10/10/2020   Procedure: RIGHT HEART CATH AND CORONARY/GRAFT ANGIOGRAPHY;  Surgeon: Yvonne Kendall, MD;  Location: [facility] INVASIVE CV LAB;  Service: Cardiovascular;  Laterality: N/A;   TEE WITHOUT CARDIOVERSION N/A 03/06/2019   Procedure: TRANSESOPHAGEAL ECHOCARDIOGRAM (TEE);  Surgeon: Linden Dolin, MD;  Location: [facility] OR;  Service: Open Heart  Surgery;  Laterality: N/A;   TEE WITHOUT CARDIOVERSION N/A 10/10/2020   Procedure: TRANSESOPHAGEAL ECHOCARDIOGRAM (TEE);  Surgeon: Wendall Stade, MD;  Location: [facility] ENDOSCOPY;  Service: Cardiovascular;  Laterality: N/A;   TEE WITHOUT CARDIOVERSION N/A 10/18/2020   Procedure: TRANSESOPHAGEAL ECHOCARDIOGRAM (TEE);  Surgeon: Tonny Bollman, MD;  Location: [facility] OR;  Service: Open Heart Surgery;  Laterality: N/A;   TEMPORARY PACEMAKER N/A 10/17/2020   Procedure: TEMPORARY PACEMAKER;  Surgeon: Marinus Maw, MD;  Location: [facility] INVASIVE CV LAB;  Service: Cardiovascular;  Laterality: N/A;    SOCIAL HISTORY: Social History   Socioeconomic History   Marital status: Married    Spouse name: Gregory [name]   Number of children: 3   Years of education: Not on file   Highest education level: Not on file  Occupational History   Occupation: Holiday representative  Tobacco Use   Smoking status: Never   Smokeless tobacco: Never  Vaping Use   Vaping Use: Never used  Substance and Sexual Activity   Alcohol use: Yes    Alcohol/week: 12.0 standard drinks of alcohol    Types: 12 Cans of beer per week   Drug use: Yes    Frequency: 2.0 times per week    Types:  Marijuana    Comment: gummies   Sexual activity: Not on file  Other Topics Concern   Not on file  Social History Narrative   Lives at home with wife.   Social Determinants of Health   Financial Resource Strain: Low Risk  (12/22/2021)   Overall Financial Resource Strain (CARDIA)    Difficulty of Paying Living Expenses: Not hard at all  Food Insecurity: No Food Insecurity (03/08/2022)   Hunger Vital Sign    Worried About Running Out of Food in the Last Year: Never true    Ran Out of Food in the Last Year: Never true  Transportation Needs: No Transportation Needs (03/08/2022)   PRAPARE - Administrator, Civil Service (Medical): No    Lack of Transportation (Non-Medical): No  Physical Activity: Sufficiently Active (12/22/2021)   Exercise Vital Sign     Days of Exercise per Week: 5 days    Minutes of Exercise per Session: 150+ min  Stress: No Stress Concern Present (12/22/2021)   [employer] of Occupational Health - Occupational Stress Questionnaire    Feeling of Stress : Not at all  Social Connections: Moderately Isolated (12/22/2021)   Social Connection and Isolation Panel [NHANES]    Frequency of Communication with Friends and Family: Once a week    Frequency of Social Gatherings with Friends and Family: Three times a week    Attends Religious Services: Never    Active Member of Clubs or Organizations: No    Attends Banker Meetings: Never    Marital Status: Married  Catering manager Violence: Not At Risk (12/22/2021)   Humiliation, Afraid, Rape, and Kick questionnaire    Fear of Current or Ex-Partner: No    Emotionally Abused: No    Physically Abused: No    Sexually Abused: No    FAMILY HISTORY: Family History  Adopted: Yes  Problem Relation Age of Onset   Cancer Mother        breast cancer   Alcohol abuse Father    Breast cancer Sister     Review of Systems  Constitutional:  Positive for fatigue. Negative for appetite change, chills, fever and unexpected weight change.  HENT:   Negative for hearing loss, lump/mass and trouble swallowing.   Eyes:  Negative for eye problems and icterus.  Respiratory:  Negative for chest tightness, cough and shortness of breath.   Cardiovascular:  Negative for chest pain, leg swelling and palpitations.  Gastrointestinal:  Negative for abdominal distention, abdominal pain, constipation, diarrhea, nausea and vomiting.  Endocrine: Negative for hot flashes.  Genitourinary:  Negative for difficulty urinating.   Musculoskeletal:  Negative for arthralgias.  Skin:  Negative for itching and rash.  Neurological:  Negative for dizziness, extremity weakness, headaches and numbness.  Hematological:  Negative for adenopathy. Does not bruise/bleed easily.   Psychiatric/Behavioral:  Negative for depression. The patient is not nervous/anxious.       PHYSICAL EXAMINATION     Vitals:   04/13/22 1045  BP: 121/78  Pulse: 73  Resp: 16  Temp: 97.9 F (36.6 C)  SpO2: 96%    Physical Exam Constitutional:      General: He is not in acute distress.    Appearance: Normal appearance. He is not toxic-appearing.  HENT:     Head: Normocephalic and atraumatic.  Eyes:     General: No scleral icterus. Cardiovascular:     Rate and Rhythm: Normal rate and regular rhythm.     Pulses: Normal pulses.  Heart sounds: Normal heart sounds.  Pulmonary:     Effort: Pulmonary effort is normal.     Breath sounds: Normal breath sounds.  Abdominal:     General: Abdomen is flat. Bowel sounds are normal. There is no distension.     Palpations: Abdomen is soft.     Tenderness: There is no abdominal tenderness.  Musculoskeletal:        General: No swelling.     Cervical back: Neck supple.  Lymphadenopathy:     Cervical: No cervical adenopathy.  Skin:    General: Skin is warm and dry.     Findings: No rash.     Comments: Right lower extremity is covered with a cough on the lower leg.  He says that it is bandaged.  It appears clean dry and intact.  Neurological:     General: No focal deficit present.     Mental Status: He is alert.  Psychiatric:        Mood and Affect: Mood normal.        Behavior: Behavior normal.     LABORATORY DATA:  CBC    Component Value Date/Time   WBC 4.3 04/13/2022 0959   WBC 5.1 03/19/2022 0931   RBC 3.21 (L) 04/13/2022 0959   HGB 10.2 (L) 04/13/2022 0959   HGB 9.8 (L) 11/02/2020 1603   HCT 31.9 (L) 04/13/2022 0959   HCT 29.4 (L) 11/02/2020 1603   PLT 216 04/13/2022 0959   PLT 187 11/02/2020 1603   MCV 99.4 04/13/2022 0959   MCV 92 11/02/2020 1603   MCH 31.8 04/13/2022 0959   MCHC 32.0 04/13/2022 0959   RDW 14.7 04/13/2022 0959   RDW 18.0 (H) 11/02/2020 1603   LYMPHSABS 1.2 04/13/2022 0959   LYMPHSABS  1.7 10/06/2020 1629   MONOABS 0.4 04/13/2022 0959   EOSABS 0.1 04/13/2022 0959   EOSABS 0.1 10/06/2020 1629   BASOSABS 0.0 04/13/2022 0959   BASOSABS 0.0 10/06/2020 1629    CMP     Component Value Date/Time   NA 136 03/19/2022 0931   NA 141 11/02/2020 1603   K 4.7 03/19/2022 0931   CL 104 03/19/2022 0931   CO2 25 03/19/2022 0931   GLUCOSE 101 (H) 03/19/2022 0931   GLUCOSE 92 10/25/2005 1507   BUN 16 03/19/2022 0931   BUN 23 11/02/2020 1603   CREATININE 0.90 03/19/2022 0931   CREATININE 0.79 01/11/2022 0758   CALCIUM 8.6 (L) 03/19/2022 0931   PROT 6.7 01/11/2022 0758   PROT 6.6 08/04/2018 1052   ALBUMIN 4.4 01/11/2022 0758   ALBUMIN 4.5 08/04/2018 1052   AST 50 (H) 01/11/2022 0758   ALT 19 01/11/2022 0758   ALKPHOS 39 01/11/2022 0758   BILITOT 1.4 (H) 01/11/2022 0758   GFRNONAA >60 03/19/2022 0931   GFRNONAA >60 01/11/2022 0758   GFRAA >60 03/17/2019 0104       ASSESSMENT and THERAPY PLAN:   Iron deficiency anemia His ferritin level has decreased and he does not really enjoy taking an additional pill.  Since his surgery has been postponed I suggested with his ferritin and iron saturation levels being low and the recommendation for optimal iron levels in order for erythrocyte stimulating agents to work at their best I suggested the following.  1.  IV iron weekly x 2 weeks with Venofer 400 mg 2.  Repeat iron studies in 6 weeks.  Vitamin B 12 deficiency He will continue on his B12 injections he is tolerating them well.  Macrocytic  anemia Will continue with Retacrit given every 2 weeks.  I am hopeful that his hemoglobin will continue to improve with IV iron so that the Retacrit can work optimally.  He is tolerating well with no concerns.    He will return every 2 weeks for lab and injection he will follow-up with Dr. Pamelia Hoit in 6 weeks.   All questions were answered. The patient knows to call the clinic with any problems, questions or concerns. We can certainly see  the patient much sooner if necessary.  Total encounter time:30 minutes*in face-to-face visit time, chart review, lab review, care coordination, order entry, and documentation of the encounter time.    Lillard Anes, NP 04/16/22 9:33 AM Medical Oncology and Hematology [facility] [address] [place], [place] [address] Tel. [phone]    Fax. [phone]  *Total Encounter Time as defined by the Centers for Medicare and Medicaid Services includes, in addition to the face-to-face time of a patient visit (documented in the note above) non-face-to-face time: obtaining and reviewing outside history, ordering and reviewing medications, tests or procedures, care coordination (communications with other health care professionals or caregivers) and documentation in the medical record.

## 2022-04-16 ENCOUNTER — Encounter: Payer: Self-pay | Admitting: Hematology and Oncology

## 2022-04-16 ENCOUNTER — Encounter (HOSPITAL_BASED_OUTPATIENT_CLINIC_OR_DEPARTMENT_OTHER): Payer: Medicare HMO | Admitting: General Surgery

## 2022-04-16 DIAGNOSIS — I110 Hypertensive heart disease with heart failure: Secondary | ICD-10-CM | POA: Diagnosis not present

## 2022-04-16 DIAGNOSIS — I351 Nonrheumatic aortic (valve) insufficiency: Secondary | ICD-10-CM | POA: Diagnosis not present

## 2022-04-16 DIAGNOSIS — I739 Peripheral vascular disease, unspecified: Secondary | ICD-10-CM | POA: Diagnosis not present

## 2022-04-16 DIAGNOSIS — L97812 Non-pressure chronic ulcer of other part of right lower leg with fat layer exposed: Secondary | ICD-10-CM | POA: Diagnosis not present

## 2022-04-16 DIAGNOSIS — I872 Venous insufficiency (chronic) (peripheral): Secondary | ICD-10-CM | POA: Diagnosis not present

## 2022-04-16 DIAGNOSIS — I4891 Unspecified atrial fibrillation: Secondary | ICD-10-CM | POA: Diagnosis not present

## 2022-04-16 DIAGNOSIS — I2510 Atherosclerotic heart disease of native coronary artery without angina pectoris: Secondary | ICD-10-CM | POA: Diagnosis not present

## 2022-04-16 DIAGNOSIS — Z950 Presence of cardiac pacemaker: Secondary | ICD-10-CM | POA: Diagnosis not present

## 2022-04-16 DIAGNOSIS — I420 Dilated cardiomyopathy: Secondary | ICD-10-CM | POA: Diagnosis not present

## 2022-04-16 DIAGNOSIS — I5032 Chronic diastolic (congestive) heart failure: Secondary | ICD-10-CM | POA: Diagnosis not present

## 2022-04-16 DIAGNOSIS — I890 Lymphedema, not elsewhere classified: Secondary | ICD-10-CM | POA: Diagnosis not present

## 2022-04-16 NOTE — Assessment & Plan Note (Signed)
He will continue on his B12 injections he is tolerating them well.

## 2022-04-16 NOTE — Assessment & Plan Note (Addendum)
Will continue with Retacrit given every 2 weeks.  I am hopeful that his hemoglobin will continue to improve with IV iron so that the Retacrit can work optimally.  He is tolerating well with no concerns.    He will return every 2 weeks for lab and injection he will follow-up with Dr. Pamelia Hoit in 6 weeks.

## 2022-04-16 NOTE — Assessment & Plan Note (Signed)
His ferritin level has decreased and he does not really enjoy taking an additional pill.  Since his surgery has been postponed I suggested with his ferritin and iron saturation levels being low and the recommendation for optimal iron levels in order for erythrocyte stimulating agents to work at their best I suggested the following.  1.  IV iron weekly x 2 weeks with Venofer 400 mg 2.  Repeat iron studies in 6 weeks.

## 2022-04-17 NOTE — Progress Notes (Signed)
[name], [name] ([id number]) [id number] Page 1 of 7 Visit Report for 04/16/2022 Arrival Information Details Patient Name: Date of Service: Gregory [name], [place] RD J. 04/16/2022 8:15 A M Medical Record Number: [id number] Patient Account Number: [id number] Date of Birth/Sex: Treating RN: 1950/12/26 (72 y.o. M) Primary Care Richardson Dubree: [name] Other Clinician: Referring Xzaria Teo: Treating Dimitra Woodstock/Extender: [name], [name] in Treatment: 0 Visit Information History Since Last Visit All ordered tests and consults were completed: No Patient Arrived: [name] Added or deleted any medications: No Arrival Time: 08:24 Any new allergies or adverse reactions: No Accompanied By: wife Had a fall or experienced change in No Transfer Assistance: None activities of daily living that may affect Patient Identification Verified: Yes risk of falls: Patient Requires Transmission-Based Precautions: No Signs or symptoms of abuse/neglect since last visito No Patient Has Alerts: Yes Hospitalized since last visit: No Patient Alerts: R ABI N/C Implantable device outside of the clinic excluding No cellular tissue based products placed in the center since last visit: Has Dressing in Place as Prescribed: Yes Has Compression in Place as Prescribed: Yes Pain Present Now: No Electronic Signature(s) Signed: 04/16/2022 5:20:38 PM By: [name] RN, BSN Entered By: [name] on 04/16/2022 08:33:03 -------------------------------------------------------------------------------- Compression Therapy Details Patient Name: Date of Service: Gregory [name], [name] 04/16/2022 8:15 A M Medical Record Number: [id number] Patient Account Number: [id number] Date of Birth/Sex: Treating RN: [birth year] (72 y.o. Gregory [name] Primary Care Nerea Bordenave: [name] Other Clinician: Referring Corsica Franson: Treating Katiya Fike/Extender: [name], [name] in  Treatment: 0 Compression Therapy Performed for Wound Assessment: Wound #1 Right,Lateral Lower Leg Performed By: Clinician [name], RN Compression Type: Double Layer Post Procedure Diagnosis Same as Pre-procedure Notes urgo lite Electronic Signature(s) Signed: 04/16/2022 5:20:38 PM By: [name] RN, BSN Entered By: [name] on 04/16/2022 08:46:52 -------------------------------------------------------------------------------- Encounter Discharge Information Details Patient Name: Date of Service: Gregory [name], [name] 04/16/2022 8:15 A M Medical Record Number: [id number] Patient Account Number: [id number] Date of Birth/Sex: Treating RN: 03-06-1950 (72 y.o. Gregory [name] Primary Care Deke Tilghman: [name] Other Clinician: Referring Shanicqua Coldren: Treating Adetokunbo Mccadden/Extender: [name], [name] in Treatment: 0 Encounter Discharge Information Items Post Procedure Vitals RODRICKUS, MIN ([id number]) [id number] Page 2 of 7 Discharge Condition: Stable Temperature (F): 97.5 Ambulatory Status: Cane Pulse (bpm): 71 Discharge Destination: Home Respiratory Rate (breaths/min): 18 Transportation: Private Auto Blood Pressure (mmHg): 90/47 Accompanied By: spouse Schedule Follow-up Appointment: Yes Clinical Summary of Care: Patient Declined Electronic Signature(s) Signed: 04/16/2022 5:20:38 PM By: [name] RN, BSN Entered By: [name] on 04/16/2022 09:04:34 -------------------------------------------------------------------------------- Lower Extremity Assessment Details Patient Name: Date of Service: Gregory [name], [name] 04/16/2022 8:15 A M Medical Record Number: [id number] Patient Account Number: [id number] Date of Birth/Sex: Treating RN: October 24, 1950 (72 y.o. Gregory [name] Primary Care Jendaya Gossett: [name] Other Clinician: Referring Sirr Kabel: Treating Nahum Sherrer/Extender: [name],  [name] in Treatment: 0 Edema Assessment Assessed: [Left: No] [Right: No] Edema: [Left: Ye] [Right: s] Calf Left: Right: Point of Measurement: From Medial Instep 35 cm Ankle Left: Right: Point of Measurement: From Medial Instep 23 cm Vascular Assessment Pulses: Dorsalis Pedis Palpable: [Right:Yes] Electronic Signature(s) Signed: 04/16/2022 5:20:38 PM By: [name] RN, BSN Entered By: [name] on 04/16/2022 08:34:10 -------------------------------------------------------------------------------- Multi Wound Chart Details Patient Name: Date of Service: Gregory [name], [name] 04/16/2022 8:15 A M Medical Record Number: [id number] Patient Account Number: [id number] Date of Birth/Sex: Treating RN: 10/13/1950 (72 y.o. M) Primary Care  Demari Kropp: [name] Other Clinician: Referring Annamae Shivley: Treating Temika Sutphin/Extender: [name], [name] in Treatment: 0 Vital Signs Height(in): 66 Pulse(bpm): 71 Weight(lbs): 225 Blood Pressure(mmHg): 90/47 Body Mass Index(BMI): 36.3 Temperature(F): 97.5 Respiratory Rate(breaths/min): 18 [1:Photos: No Photos Right, Lateral Lower Leg Wound Location: Trauma Wounding Event:] [N/A:N/A N/A N/A] MALIIK, KARNER ([id number]) [1:Venous Leg Ulcer Primary Etiology: Cataracts, Anemia, Arrhythmia, Comorbid History: Coronary Artery Disease, Hypertension, Myocardial Infarction, Peripheral Arterial Disease, Gout, Osteoarthritis, Confinement Anxiety 03/05/2022 Date Acquired: 0 Weeks of  Treatment: Open Wound Status: No Wound Recurrence: 1.7x1.7x0.1 Measurements L x W x D (cm) 2.27 A (cm) : rea 0.227 Volume (cm) : 10.50% % Reduction in A rea: 10.60% % Reduction in Volume: Full Thickness Without Exposed Classification: Support  Structures Medium Exudate A mount: Serosanguineous Exudate Type: red, brown Exudate Color: Flat and Intact Wound Margin: Large (67-100%) Granulation A mount: Red Granulation Quality: Small (1-33%) Necrotic A  mount: Fat Layer (Subcutaneous Tissue): Yes  N/A Exposed Structures: Fascia: No Tendon: No Muscle: No Joint: No Bone: No Small (1-33%) Epithelialization: Debridement - Selective/Open Wound N/A Debridement: Pre-procedure Verification/Time Out 08:45 Taken: Lidocaine 4% T opical Solution Pain Control:  Slough Tissue Debrided: Non-Viable Tissue Level: 2.89 Debridement A (sq cm): rea Curette Instrument: Minimum Bleeding: Silver Nitrate Hemostasis A chieved: 2 Procedural Pain: 0 Post Procedural Pain: Procedure was tolerated well Debridement Treatment  Response: 1.7x1.7x0.1 Post Debridement Measurements L x W x D (cm) 0.227 Post Debridement Volume: (cm) No Abnormalities Noted Periwound Skin Texture: No Abnormalities Noted Periwound Skin Moisture: Hemosiderin Staining: Yes Periwound Skin Color: No  Abnormality Temperature: Yes Tenderness on Palpation: Compression Therapy Procedures Performed: Debridement] [N/A:N/A N/A N/A N/A N/A N/A N/A N/A N/A N/A N/A N/A N/A N/A N/A N/A N/A N/A N/A N/A N/A N/A N/A N/A N/A N/A N/A N/A N/A N/A N/A N/A N/A N/A N/A  N/A N/A N/A N/A] Treatment Notes Electronic Signature(s) Signed: 04/16/2022 8:49:41 AM By: Duanne Guess MD FACS Entered By: Duanne Guess on 04/16/2022 08:49:40 -------------------------------------------------------------------------------- Multi-Disciplinary Care Plan Details Patient Name: Date of Service: Gregory [name], [name] 04/16/2022 8:15 A M Medical Record Number: [id number] Patient Account Number: [id number] Date of Birth/Sex: Treating RN: 01-Jan-1951 (72 y.o. Gregory [name] Primary Care Rambo Sarafian: [name] Other Clinician: Referring Evgenia Merriman: Treating Ashland Osmer/Extender: [name], [name] in Treatment: 0 Multidisciplinary Care Plan reviewed with physician Active Inactive Venous Leg Ulcer JERMIAH, SODERMAN ([id number]) [id number] Page 4 of 7 Nursing Diagnoses: Actual venous Insuffiency (use  after diagnosis is confirmed) Knowledge deficit related to disease process and management Goals: Patient will maintain optimal edema control Date Initiated: 04/10/2022 Target Resolution Date: 05/08/2022 Goal Status: Active Interventions: Assess peripheral edema status every visit. Compression as ordered Treatment Activities: Therapeutic compression applied : 04/10/2022 Notes: Wound/Skin Impairment Nursing Diagnoses: Impaired tissue integrity Knowledge deficit related to ulceration/compromised skin integrity Goals: Patient/caregiver will verbalize understanding of skin care regimen Date Initiated: 04/10/2022 Target Resolution Date: 05/08/2022 Goal Status: Active Ulcer/skin breakdown will have a volume reduction of 30% by week 4 Date Initiated: 04/10/2022 Target Resolution Date: 05/08/2022 Goal Status: Active Interventions: Assess patient/caregiver ability to obtain necessary supplies Assess patient/caregiver ability to perform ulcer/skin care regimen upon admission and as needed Assess ulceration(s) every visit Provide education on ulcer and skin care Treatment Activities: Skin care regimen initiated : 04/10/2022 Topical wound management initiated : 04/10/2022 Notes: Electronic Signature(s) Signed: 04/16/2022 5:20:38 PM By: [name] RN, BSN Entered By: [name] on 04/16/2022 08:41:45 -------------------------------------------------------------------------------- Pain Assessment Details Patient Name: Date  of Service: Gregory [name], [name] 04/16/2022 8:15 A M Medical Record Number: [id number] Patient Account Number: [id number] Date of Birth/Sex: Treating RN: 1950/08/08 (72 y.o. M) Primary Care Maison Kestenbaum: [name] Other Clinician: Referring Zell Hylton: Treating Kery Batzel/Extender: [name], [name] in Treatment: 0 Active Problems Location of Pain Severity and Description of Pain Patient Has Paino No Site Locations Rate the pain. VERONICA, GUERRANT  ([id number]) [id number] Page 5 of 7 Rate the pain. Current Pain Level: 0 Pain Management and Medication Current Pain Management: Electronic Signature(s) Signed: 04/16/2022 5:20:38 PM By: [name] RN, BSN Entered By: [name] on 04/16/2022 08:33:19 -------------------------------------------------------------------------------- Patient/Caregiver Education Details Patient Name: Date of Service: Gregory [name], [name] 4/15/2024andnbsp8:15 A M Medical Record Number: [id number] Patient Account Number: [id number] Date of Birth/Gender: Treating RN: December 15, 1950 (72 y.o. Gregory [name] Primary Care Physician: [name] Other Clinician: Referring Physician: Treating Physician/Extender: [name], [name] in Treatment: 0 Education Assessment Education Provided To: Patient Education Topics Provided Venous: Methods: Explain/Verbal Responses: Reinforcements needed, State content correctly Wound Debridement: Methods: Explain/Verbal Responses: Reinforcements needed, State content correctly Electronic Signature(s) Signed: 04/16/2022 5:20:38 PM By: [name] RN, BSN Entered By: [name] on 04/16/2022 08:42:58 -------------------------------------------------------------------------------- Wound Assessment Details Patient Name: Date of Service: Gregory [name], [name] 04/16/2022 8:15 A M Medical Record Number: [id number] Patient Account Number: [id number] Date of Birth/Sex: Treating RN: 11-14-1950 (72 y.o. Gregory [name] Primary Care Devian Bartolomei: [name] Other Clinician: Referring Tonyetta Berko: Treating Martin Belling/Extender: [name], [name] in Treatment: 0 Wound Status Wound Number: 1 Primary Venous Leg Ulcer Etiology: Wound Location: Right, Lateral Lower Leg SHERIF, MILLSPAUGH ([id number]) [id number] Page 6 of 7 Wound Open Wounding Event: Trauma Status: Date Acquired:  03/05/2022 Comorbid Cataracts, Anemia, Arrhythmia, Coronary Artery Disease, Weeks Of Treatment: 0 History: Hypertension, Myocardial Infarction, Peripheral Arterial Disease, Clustered Wound: No Gout, Osteoarthritis, Confinement Anxiety Photos Wound Measurements Length: (cm) 1.7 Width: (cm) 1.7 Depth: (cm) 0.1 Area: (cm) 2.27 Volume: (cm) 0.227 % Reduction in Area: 10.5% % Reduction in Volume: 10.6% Epithelialization: Small (1-33%) Tunneling: No Undermining: No Wound Description Classification: Full Thickness Without Exposed Support Structures Wound Margin: Flat and Intact Exudate Amount: Medium Exudate Type: Serosanguineous Exudate Color: red, brown Foul Odor After Cleansing: No Slough/Fibrino Yes Wound Bed Granulation Amount: Large (67-100%) Exposed Structure Granulation Quality: Red, Hyper-granulation Fascia Exposed: No Necrotic Amount: Small (1-33%) Fat Layer (Subcutaneous Tissue) Exposed: Yes Necrotic Quality: Adherent Slough Tendon Exposed: No Muscle Exposed: No Joint Exposed: No Bone Exposed: No Periwound Skin Texture Texture Color No Abnormalities Noted: Yes No Abnormalities Noted: No Hemosiderin Staining: Yes Moisture No Abnormalities Noted: Yes Temperature / Pain Temperature: No Abnormality Tenderness on Palpation: Yes Treatment Notes Wound #1 (Lower Leg) Wound Laterality: Right, Lateral Cleanser Peri-Wound Care Sween Lotion (Moisturizing lotion) Discharge Instruction: Apply moisturizing lotion as directed Topical Primary Dressing Maxorb Extra Ag+ Alginate Dressing, 2x2 (in/in) Discharge Instruction: Apply to wound bed as instructed Secondary Dressing Woven Gauze Sponge, Non-Sterile 4x4 in Discharge Instruction: Apply over primary dressing as directed. Secured With [employer], two layer compression system, regular GRAESYN, SCHREIFELS ([id number]) [id number] Page 7 of 7 Discharge Instruction: Apply Urgo  K2 Lite as directed (alternative to 3 layer compression). Compression Stockings Add-Ons Electronic Signature(s) Signed: 04/16/2022 5:20:38 PM By: [name] RN, BSN Entered By: [name] on 04/16/2022 09:07:37 -------------------------------------------------------------------------------- Vitals Details Patient Name: Date of Service: Gregory [name], [name] 04/16/2022 8:15 A M Medical Record Number: [id number] Patient Account  Number: [id number] Date of Birth/Sex: Treating RN: 08-27-1950 (72 y.o. M) Primary Care Sonny Anthes: [name] Other Clinician: Referring Chala Gul: Treating Baldemar Dady/Extender: [name], [name] in Treatment: 0 Vital Signs Time Taken: 08:25 Temperature (F): 97.5 Height (in): 66 Pulse (bpm): 71 Weight (lbs): 225 Respiratory Rate (breaths/min): 18 Body Mass Index (BMI): 36.3 Blood Pressure (mmHg): 90/47 Reference Range: 80 - 120 mg / dl Electronic Signature(s) Signed: 04/16/2022 5:20:38 PM By: [name] RN, BSN Entered By: [name] on 04/16/2022 08:33:10

## 2022-04-17 NOTE — Progress Notes (Signed)
Gregory [name] ([id number]) [id number] Page 1 of 8 Visit Report for 04/16/2022 Chief Complaint Document Details Patient Name: Date of Service: Gregory [name], [place] Gregory J. 04/16/2022 8:15 A M Medical Record Number: [id number] Patient Account Number: [id number] Date of Birth/Sex: Treating RN: 22-Mar-1950 (72 y.o. M) Primary Care Provider: Kerby [name] Other Clinician: Referring Provider: Treating Provider/Extender: Gregory [name], Gregory [name] in Treatment: 0 Information Obtained from: Patient Chief Complaint Patient seen for complaints of Non-Healing Wound. Electronic Signature(s) Signed: 04/16/2022 8:50:01 AM By: Gregory Guess MD FACS Entered By: Gregory [name] on 04/16/2022 08:50:01 -------------------------------------------------------------------------------- Debridement Details Patient Name: Date of Service: Gregory [name], EDWA Gregory J. 04/16/2022 8:15 A M Medical Record Number: [id number] Patient Account Number: [id number] Date of Birth/Sex: Treating RN: 05-10-1950 (72 y.o. Gregory [name] Primary Care Provider: Kerby [name] Other Clinician: Referring Provider: Treating Provider/Extender: Gregory [name], Gregory [name] in Treatment: 0 Debridement Performed for Assessment: Wound #1 Right,Lateral Lower Leg Performed By: Physician Gregory Guess, MD Debridement Type: Debridement Severity of Tissue Pre Debridement: Fat layer exposed Level of Consciousness (Pre-procedure): Awake and Alert Pre-procedure Verification/Time Out Yes - 08:45 Taken: Start Time: 08:45 Pain Control: Lidocaine 4% T opical Solution T Area Debrided (L x W): otal 1.7 (cm) x 1.7 (cm) = 2.89 (cm) Tissue and other material debrided: Non-Viable, Slough, Slough Level: Non-Viable Tissue Debridement Description: Selective/Open Wound Instrument: Curette Bleeding: Minimum Hemostasis Achieved: Silver Nitrate Procedural Pain: 2 Post Procedural Pain: 0 Response to Treatment:  Procedure was tolerated well Level of Consciousness (Post- Awake and Alert procedure): Post Debridement Measurements of Total Wound Length: (cm) 1.7 Width: (cm) 1.7 Depth: (cm) 0.1 Volume: (cm) 0.227 Character of Wound/Ulcer Post Debridement: Improved Severity of Tissue Post Debridement: Fat layer exposed Post Procedure Diagnosis Same as Pre-procedure Notes scribed for Dr. Lady [name] by Gregory Deed, RN Electronic Signature(s) Signed: 04/16/2022 8:53:14 AM By: Gregory Guess MD FACS Gregory [name] ([id number]) [id number] Page 2 of 8 Signed: 04/16/2022 5:20:38 PM By: Gregory Deed RN, BSN Entered By: Gregory [name] on 04/16/2022 08:48:50 -------------------------------------------------------------------------------- HPI Details Patient Name: Date of Service: Gregory [name], EDWA Gregory J. 04/16/2022 8:15 A M Medical Record Number: [id number] Patient Account Number: [id number] Date of Birth/Sex: Treating RN: 1950-02-16 (72 y.o. M) Primary Care Provider: Kerby [name] Other Clinician: Referring Provider: Treating Provider/Extender: Gregory [name], Gregory [name] in Treatment: 0 History of Present Illness HPI Description: ADMISSION 04/10/2022 This is a 72 year old man with a past medical history significant for hypertension, severe aortic insufficiency status post aortic valvuloplasty, grade 1 diastolic dysfunction, peripheral vascular disease, permanent pacemaker, lymphedema, and coronary artery disease. He dropped a cinder block which struck his leg at the beginning of March 2024. He sought care in the ED on March 4. Plain films did not demonstrate any fracture or foreign body. Steri-Strips were applied. Apparently the patient was soaking the wound with hydrogen peroxide daily. He saw his PCP who gave him an IM injection of ceftriaxone and prescribed doxycycline. He was advised to elevate his legs and clean with just warm soapy water and apply antibiotic  ointment. Formal ABIs were done in February 2024 that showed noncompressible ABIs bilaterally and a TBI of 0.6 on the right and 0.59 on the left. There is a triangular wound on his left lower leg, just lateral to the anterior tibial surface. There is slough and nonviable fat present. No concern for infection. 04/16/2022: The wound is about the same size, but much cleaner this week. There is a little bit of slough on the surface.  There is hypertrophic granulation tissue present. Edema control is good. Electronic Signature(s) Signed: 04/16/2022 8:50:32 AM By: Gregory Guess MD FACS Entered By: Gregory [name] on 04/16/2022 08:50:31 -------------------------------------------------------------------------------- Physical Exam Details Patient Name: Date of Service: Gregory [name], EDWA Gregory J. 04/16/2022 8:15 A M Medical Record Number: [id number] Patient Account Number: [id number] Date of Birth/Sex: Treating RN: Jul 18, 1950 (72 y.o. M) Primary Care Provider: Kerby [name] Other Clinician: Referring Provider: Treating Provider/Extender: Gregory [name], Gregory [name] in Treatment: 0 Constitutional Hypotensive, but normal for this patient. . . . no acute distress. Respiratory Normal work of breathing on room air. Notes 04/16/2022: The wound is about the same size, but much cleaner this week. There is a little bit of slough on the surface. There is hypertrophic granulation tissue present. Edema control is good. Electronic Signature(s) Signed: 04/16/2022 8:51:13 AM By: Gregory Guess MD FACS Entered By: Gregory [name] on 04/16/2022 08:51:13 -------------------------------------------------------------------------------- Physician Orders Details Patient Name: Date of Service: Gregory [name], EDWA Gregory J. 04/16/2022 8:15 A M Medical Record Number: [id number] Patient Account Number: [id number] Date of Birth/Sex: Treating RN: 1950/05/03 (72 y.o. Gregory [name] Primary Care Provider: Kerby [name] Other  Clinician: Referring Provider: Treating Provider/Extender: Gregory [name], Gregory [name] in Treatment: 0 Verbal / Phone Orders: No Gregory [name] ([id number]) [id number] Page 3 of 8 Diagnosis Coding ICD-10 Coding Code Description [phone] Non-pressure chronic ulcer of other part of right lower leg with fat layer exposed I87.2 Venous insufficiency (chronic) (peripheral) I73.9 Peripheral vascular disease, unspecified I42.0 Dilated cardiomyopathy I35.1 Nonrheumatic aortic (valve) insufficiency I50.30 Unspecified diastolic (congestive) heart failure Follow-up Appointments ppointment in 1 week. - Dr. Lady [name] RM 1 Return A Tuesday 4/23 @ 0730 am Anesthetic Wound #1 Right,Lateral Lower Leg (In clinic) Topical Lidocaine 4% applied to wound bed Bathing/ Shower/ Hygiene May shower with protection but do not get wound dressing(s) wet. Protect dressing(s) with water repellant cover (for example, large plastic bag) or a cast cover and may then take shower. - may be purchased at [facility] or [facility] Edema Control - Lymphedema / SCD / Other Elevate legs to the level of the heart or above for 30 minutes daily and/or when sitting for 3-4 times a day throughout the day. - throughout the day Avoid standing for long periods of time. Exercise regularly Compression stocking or Garment 20-30 mm/Hg pressure to: - left leg daily Wound Treatment Wound #1 - Lower Leg Wound Laterality: Right, Lateral Peri-Wound Care: Sween Lotion (Moisturizing lotion) 1 x Per Week/30 Days Discharge Instructions: Apply moisturizing lotion as directed Prim Dressing: Maxorb Extra Ag+ Alginate Dressing, 2x2 (in/in) 1 x Per Week/30 Days ary Discharge Instructions: Apply to wound bed as instructed Secondary Dressing: Woven Gauze Sponge, Non-Sterile 4x4 in 1 x Per Week/30 Days Discharge Instructions: Apply over primary dressing as directed. Compression Wrap: Urgo K2 Lite, two layer compression  system, regular 1 x Per Week/30 Days Discharge Instructions: Apply Urgo K2 Lite as directed (alternative to 3 layer compression). Electronic Signature(s) Signed: 04/16/2022 8:53:14 AM By: Gregory Guess MD FACS Signed: 04/16/2022 5:20:38 PM By: Gregory Deed RN, BSN Previous Signature: 04/16/2022 8:51:23 AM Version By: Gregory Guess MD FACS Entered By: Gregory [name] on 04/16/2022 08:52:19 -------------------------------------------------------------------------------- Problem List Details Patient Name: Date of Service: Gregory [name], EDWA Gregory J. 04/16/2022 8:15 A M Medical Record Number: [id number] Patient Account Number: [id number] Date of Birth/Sex: Treating RN: November 10, 1950 (72 y.o. Gregory [name] Primary Care Provider: Kerby [name] Other Clinician: Referring Provider: Treating Provider/Extender: Gregory [name], Gregory [name] in Treatment: 0  Active Problems ICD-10 Encounter Code Description Active Date MDM Diagnosis L97.812 Non-pressure chronic ulcer of other part of right lower leg with fat layer 04/10/2022 No Yes exposed Gregory [name], Gregory [name] ([id number]) [id number] Page 4 of 8 I87.2 Venous insufficiency (chronic) (peripheral) 04/10/2022 No Yes I73.9 Peripheral vascular disease, unspecified 04/10/2022 No Yes I42.0 Dilated cardiomyopathy 04/10/2022 No Yes I35.1 Nonrheumatic aortic (valve) insufficiency 04/10/2022 No Yes I50.30 Unspecified diastolic (congestive) heart failure 04/10/2022 No Yes Inactive Problems Resolved Problems Electronic Signature(s) Signed: 04/16/2022 8:49:33 AM By: Gregory Guess MD FACS Entered By: Gregory [name] on 04/16/2022 08:49:33 -------------------------------------------------------------------------------- Progress Note Details Patient Name: Date of Service: Gregory [name], EDWA Gregory J. 04/16/2022 8:15 A M Medical Record Number: [id number] Patient Account Number: [id number] Date of Birth/Sex: Treating RN: 1950/12/14 (72 y.o.  M) Primary Care Provider: Kerby [name] Other Clinician: Referring Provider: Treating Provider/Extender: Gregory [name], Gregory [name] in Treatment: 0 Subjective Chief Complaint Information obtained from Patient Patient seen for complaints of Non-Healing Wound. History of Present Illness (HPI) ADMISSION 04/10/2022 This is a 72 year old man with a past medical history significant for hypertension, severe aortic insufficiency status post aortic valvuloplasty, grade 1 diastolic dysfunction, peripheral vascular disease, permanent pacemaker, lymphedema, and coronary artery disease. He dropped a cinder block which struck his leg at the beginning of March 2024. He sought care in the ED on March 4. Plain films did not demonstrate any fracture or foreign body. Steri-Strips were applied. Apparently the patient was soaking the wound with hydrogen peroxide daily. He saw his PCP who gave him an IM injection of ceftriaxone and prescribed doxycycline. He was advised to elevate his legs and clean with just warm soapy water and apply antibiotic ointment. Formal ABIs were done in February 2024 that showed noncompressible ABIs bilaterally and a TBI of 0.6 on the right and 0.59 on the left. There is a triangular wound on his left lower leg, just lateral to the anterior tibial surface. There is slough and nonviable fat present. No concern for infection. 04/16/2022: The wound is about the same size, but much cleaner this week. There is a little bit of slough on the surface. There is hypertrophic granulation tissue present. Edema control is good. Patient History Information obtained from Patient, Chart. Family History Unknown History. Social History Never smoker, Marital Status - Married, Alcohol Use - Moderate, Drug Use - Current History - occaisional TCH, Caffeine Use - Moderate. Medical History Eyes Patient has history of Cataracts - bil extractions Ear/Nose/Mouth/Throat Denies history of Chronic sinus  problems/congestion, Middle ear problems Hematologic/Lymphatic Patient has history of Anemia - macrocytic Cardiovascular LAVARIUS, DOUGHTEN ([id number]) [id number] Page 5 of 8 Patient has history of Arrhythmia - afib, complete heart block, Coronary Artery Disease, Hypertension, Myocardial Infarction, Peripheral Arterial Disease Endocrine Denies history of Type I Diabetes, Type II Diabetes Genitourinary Denies history of End Stage Renal Disease Integumentary (Skin) Denies history of History of Burn Musculoskeletal Patient has history of Gout, Osteoarthritis Denies history of Rheumatoid Arthritis, Osteomyelitis Oncologic Denies history of Received Chemotherapy, Received Radiation Psychiatric Patient has history of Confinement Anxiety Denies history of Anorexia/bulimia Hospitalization/Surgery History - pacemaker insertion 10/2020. - balloon aortic valve valvuloplasty. - CABG 03/2020. - aortic valve replacement. - cataract extraction. Medical A Surgical History Notes nd Cardiovascular aortic insufficiency, cardiomyopathy, pacemaker Objective Constitutional Hypotensive, but normal for this patient. no acute distress. Vitals Time Taken: 8:25 AM, Height: 66 in, Weight: 225 lbs, BMI: 36.3, Temperature: 97.5 F, Pulse: 71 bpm, Respiratory Rate: 18 breaths/min, Blood Pressure: 90/47 mmHg. Respiratory Normal work  of breathing on room air. General Notes: 04/16/2022: The wound is about the same size, but much cleaner this week. There is a little bit of slough on the surface. There is hypertrophic granulation tissue present. Edema control is good. Integumentary (Hair, Skin) Wound #1 status is Open. Original cause of wound was Trauma. The date acquired was: 03/05/2022. The wound is located on the Right,Lateral Lower Leg. The wound measures 1.7cm length x 1.7cm width x 0.1cm depth; 2.27cm^2 area and 0.227cm^3 volume. There is Fat Layer (Subcutaneous Tissue) exposed.  There is no tunneling or undermining noted. There is a medium amount of serosanguineous drainage noted. The wound margin is flat and intact. There is large (67- 100%) red, hyper - granulation within the wound bed. There is a small (1-33%) amount of necrotic tissue within the wound bed including Adherent Slough. The periwound skin appearance had no abnormalities noted for texture. The periwound skin appearance had no abnormalities noted for moisture. The periwound skin appearance exhibited: Hemosiderin Staining. Periwound temperature was noted as No Abnormality. The periwound has tenderness on palpation. Assessment Active Problems ICD-10 Non-pressure chronic ulcer of other part of right lower leg with fat layer exposed Venous insufficiency (chronic) (peripheral) Peripheral vascular disease, unspecified Dilated cardiomyopathy Nonrheumatic aortic (valve) insufficiency Unspecified diastolic (congestive) heart failure Procedures Wound #1 Pre-procedure diagnosis of Wound #1 is a Venous Leg Ulcer located on the Right,Lateral Lower Leg .Severity of Tissue Pre Debridement is: Fat layer exposed. There was a Selective/Open Wound Non-Viable Tissue Debridement with a total area of 2.89 sq cm performed by Gregory Guess, MD. With the following instrument(s): Curette to remove Non-Viable tissue/material. Material removed includes [facility] after achieving pain control using Lidocaine 4% T opical Solution. No specimens were taken. A time out was conducted at 08:45, prior to the start of the procedure. A Minimum amount of bleeding was controlled with Silver Nitrate. The procedure was tolerated well with a pain level of 2 throughout and a pain level of 0 following the procedure. Post Debridement Measurements: 1.7cm length x 1.7cm width x 0.1cm depth; 0.227cm^3 volume. Character of Wound/Ulcer Post Debridement is improved. Severity of Tissue Post Debridement is: Fat layer exposed. Post procedure Diagnosis Wound  #1: Same as Pre-Procedure Gregory [name], Gregory [name] ([id number]) [id number] Page 6 of 8 General Notes: scribed for Dr. Lady [name] by Gregory Deed, RN. Pre-procedure diagnosis of Wound #1 is a Venous Leg Ulcer located on the Right,Lateral Lower Leg . There was a Double Layer Compression Therapy Procedure by Gregory Deed, RN. Post procedure Diagnosis Wound #1: Same as Pre-Procedure Notes: urgo lite. Plan Follow-up Appointments: Return Appointment in 1 week. - Dr. Lady [name] RM 1 Tuesday 4/23 @ 0730 am Anesthetic: Wound #1 Right,Lateral Lower Leg: (In clinic) Topical Lidocaine 4% applied to wound bed Bathing/ Shower/ Hygiene: May shower with protection but do not get wound dressing(s) wet. Protect dressing(s) with water repellant cover (for example, large plastic bag) or a cast cover and may then take shower. - may be purchased at [facility] or [facility] Edema Control - Lymphedema / SCD / Other: Elevate legs to the level of the heart or above for 30 minutes daily and/or when sitting for 3-4 times a day throughout the day. - throughout the day Avoid standing for long periods of time. Exercise regularly Compression stocking or Garment 20-30 mm/Hg pressure to: - left leg daily WOUND #1: - Lower Leg Wound Laterality: Right, Lateral Peri-Wound Care: Sween Lotion (Moisturizing lotion) 1 x Per Week/30 Days Discharge Instructions: Apply moisturizing lotion as directed Prim  Dressing: Maxorb Extra Ag+ Alginate Dressing, 2x2 (in/in) 1 x Per Week/30 Days ary Discharge Instructions: Apply to wound bed as instructed Secondary Dressing: Woven Gauze Sponge, Non-Sterile 4x4 in 1 x Per Week/30 Days Discharge Instructions: Apply over primary dressing as directed. Com pression Wrap: Urgo K2 Lite, two layer compression system, regular 1 x Per Week/30 Days Discharge Instructions: Apply Urgo K2 Lite as directed (alternative to 3 layer compression). 04/16/2022: The wound is about the same size, but  much cleaner this week. There is a little bit of slough on the surface. There is hypertrophic granulation tissue present. Edema control is good. I used a curette to debride the slough from the wound surface. I then chemically cauterized the hypertrophic granulation tissue with silver nitrate. Will continue silver alginate and 3 layer compression or equivalent. I reminded him to keep his leg elevated and make sure he is getting 80 to 100 g of protein a day. Follow- up in 1 week. Electronic Signature(s) Signed: 04/16/2022 9:55:45 AM By: Gregory Guess MD FACS Signed: 04/16/2022 5:20:38 PM By: Gregory Deed RN, BSN Previous Signature: 04/16/2022 8:52:06 AM Version By: Gregory Guess MD FACS Entered By: Gregory [name] on 04/16/2022 09:07:56 -------------------------------------------------------------------------------- HxROS Details Patient Name: Date of Service: Gregory [name], EDWA Gregory J. 04/16/2022 8:15 A M Medical Record Number: [id number] Patient Account Number: [id number] Date of Birth/Sex: Treating RN: 1950/06/22 (72 y.o. M) Primary Care Provider: Kerby [name] Other Clinician: Referring Provider: Treating Provider/Extender: Gregory [name], Gregory [name] in Treatment: 0 Information Obtained From Patient Chart Eyes Medical History: Positive for: Cataracts - bil extractions Ear/Nose/Mouth/Throat Medical History: Negative for: Chronic sinus problems/congestion; Middle ear problems Hematologic/Lymphatic Medical History: Positive for: Anemia - macrocytic Gregory [name], Gregory [name] ([id number]) [id number] Page 7 of 8 Cardiovascular Medical History: Positive for: Arrhythmia - afib, complete heart block; Coronary Artery Disease; Hypertension; Myocardial Infarction; Peripheral Arterial Disease Past Medical History Notes: aortic insufficiency, cardiomyopathy, pacemaker Endocrine Medical History: Negative for: Type I Diabetes; Type II  Diabetes Genitourinary Medical History: Negative for: End Stage Renal Disease Integumentary (Skin) Medical History: Negative for: History of Burn Musculoskeletal Medical History: Positive for: Gout; Osteoarthritis Negative for: Rheumatoid Arthritis; Osteomyelitis Oncologic Medical History: Negative for: Received Chemotherapy; Received Radiation Psychiatric Medical History: Positive for: Confinement Anxiety Negative for: Anorexia/bulimia HBO Extended History Items Eyes: Cataracts Immunizations Pneumococcal Vaccine: Received Pneumococcal Vaccination: Yes Received Pneumococcal Vaccination On or After 60th Birthday: Yes Implantable Devices Yes Hospitalization / Surgery History Type of Hospitalization/Surgery pacemaker insertion 10/2020 balloon aortic valve valvuloplasty CABG 03/2020 aortic valve replacement cataract extraction Family and Social History Unknown History: Yes; Never smoker; Marital Status - Married; Alcohol Use: Moderate; Drug Use: Current History - occaisional TCH; Caffeine Use: Moderate; Financial Concerns: No; Food, Clothing or Shelter Needs: No; Support System Lacking: No; Transportation Concerns: No Electronic Signature(s) Signed: 04/16/2022 8:53:14 AM By: Gregory Guess MD FACS Entered By: Gregory [name] on 04/16/2022 08:50:37 -------------------------------------------------------------------------------- SuperBill Details Patient Name: Date of Service: Gregory [name], EDWA Gregory J. 04/16/2022 Gregory [name] ([id number]) [id number] Page 8 of 8 Medical Record Number: [id number] Patient Account Number: [id number] Date of Birth/Sex: Treating RN: [birth year] (72 y.o. M) Primary Care Provider: Kerby [name] Other Clinician: Referring Provider: Treating Provider/Extender: Gregory [name], Gregory [name] in Treatment: 0 Diagnosis Coding ICD-10 Codes Code Description [phone] Non-pressure chronic ulcer of other part of right  lower leg with fat layer exposed I87.2 Venous insufficiency (chronic) (peripheral) I73.9 Peripheral vascular disease, unspecified I42.0 Dilated cardiomyopathy I35.1 Nonrheumatic aortic (valve) insufficiency I50.30 Unspecified diastolic (congestive) heart failure Facility  Procedures : CPT4 Code: [id number] Description: 97597 - DEBRIDE WOUND 1ST 20 SQ CM OR < ICD-10 Diagnosis Description L97.812 Non-pressure chronic ulcer of other part of right lower leg with fat layer expos Modifier: ed Quantity: 1 Physician Procedures : CPT4 Code Description Modifier [id number] - WC PHYS LEVEL 3 - EST PT 25 ICD-10 Diagnosis Description L97.812 Non-pressure chronic ulcer of other part of right lower leg with fat layer exposed I87.2 Venous insufficiency (chronic) (peripheral) I73.9  Peripheral vascular disease, unspecified I50.30 Unspecified diastolic (congestive) heart failure Quantity: 1 : [id number] 97597 - WC PHYS DEBR WO ANESTH 20 SQ CM ICD-10 Diagnosis Description L97.812 Non-pressure chronic ulcer of other part of right lower leg with fat layer exposed Quantity: 1 Electronic Signature(s) Signed: 04/16/2022 8:52:24 AM By: Gregory Guess MD FACS Entered By: Gregory [name] on 04/16/2022 08:52:23

## 2022-04-19 ENCOUNTER — Ambulatory Visit (INDEPENDENT_AMBULATORY_CARE_PROVIDER_SITE_OTHER): Payer: Medicare HMO

## 2022-04-19 DIAGNOSIS — I420 Dilated cardiomyopathy: Secondary | ICD-10-CM

## 2022-04-19 LAB — CUP PACEART REMOTE DEVICE CHECK
Battery Remaining Longevity: 65 mo
Battery Remaining Percentage: 77 %
Battery Voltage: 2.96 V
Brady Statistic AP VP Percent: 89 %
Brady Statistic AP VS Percent: 1 %
Brady Statistic AS VP Percent: 10 %
Brady Statistic AS VS Percent: 1 %
Brady Statistic RA Percent Paced: 89 %
Date Time Interrogation Session: 20240418030031
HighPow Impedance: 59 Ohm
Implantable Lead Connection Status: 753985
Implantable Lead Connection Status: 753985
Implantable Lead Connection Status: 753985
Implantable Lead Implant Date: 20221020
Implantable Lead Implant Date: 20221020
Implantable Lead Implant Date: 20221020
Implantable Lead Location: 753858
Implantable Lead Location: 753859
Implantable Lead Location: 753860
Implantable Pulse Generator Implant Date: 20221020
Lead Channel Impedance Value: 390 Ohm
Lead Channel Impedance Value: 460 Ohm
Lead Channel Impedance Value: 750 Ohm
Lead Channel Pacing Threshold Amplitude: 0.625 V
Lead Channel Pacing Threshold Amplitude: 0.75 V
Lead Channel Pacing Threshold Amplitude: 1.875 V
Lead Channel Pacing Threshold Pulse Width: 0.5 ms
Lead Channel Pacing Threshold Pulse Width: 0.5 ms
Lead Channel Pacing Threshold Pulse Width: 0.7 ms
Lead Channel Sensing Intrinsic Amplitude: 12 mV
Lead Channel Sensing Intrinsic Amplitude: 2.6 mV
Lead Channel Setting Pacing Amplitude: 1.625
Lead Channel Setting Pacing Amplitude: 2.375
Lead Channel Setting Pacing Amplitude: 2.5 V
Lead Channel Setting Pacing Pulse Width: 0.5 ms
Lead Channel Setting Pacing Pulse Width: 0.7 ms
Lead Channel Setting Sensing Sensitivity: 0.5 mV
Pulse Gen Serial Number: 111052020
Zone Setting Status: 755011

## 2022-04-20 ENCOUNTER — Inpatient Hospital Stay: Payer: Medicare HMO

## 2022-04-20 VITALS — BP 124/68 | HR 72 | Temp 98.6°F | Resp 18 | Wt 234.8 lb

## 2022-04-20 DIAGNOSIS — D519 Vitamin B12 deficiency anemia, unspecified: Secondary | ICD-10-CM | POA: Diagnosis not present

## 2022-04-20 DIAGNOSIS — D539 Nutritional anemia, unspecified: Secondary | ICD-10-CM | POA: Diagnosis not present

## 2022-04-20 DIAGNOSIS — D638 Anemia in other chronic diseases classified elsewhere: Secondary | ICD-10-CM | POA: Diagnosis not present

## 2022-04-20 DIAGNOSIS — E538 Deficiency of other specified B group vitamins: Secondary | ICD-10-CM

## 2022-04-20 DIAGNOSIS — D509 Iron deficiency anemia, unspecified: Secondary | ICD-10-CM | POA: Diagnosis not present

## 2022-04-20 MED ORDER — SODIUM CHLORIDE 0.9 % IV SOLN: Freq: Once | INTRAVENOUS | Status: AC

## 2022-04-20 MED ORDER — SODIUM CHLORIDE 0.9 % IV SOLN
400.0000 mg | Freq: Once | INTRAVENOUS | Status: AC
  Administered 2022-04-20: 400 mg via INTRAVENOUS
  Filled 2022-04-20: qty 20

## 2022-04-20 NOTE — Patient Instructions (Signed)

## 2022-04-24 ENCOUNTER — Encounter (HOSPITAL_BASED_OUTPATIENT_CLINIC_OR_DEPARTMENT_OTHER): Payer: Medicare HMO | Admitting: General Surgery

## 2022-04-24 DIAGNOSIS — I2510 Atherosclerotic heart disease of native coronary artery without angina pectoris: Secondary | ICD-10-CM | POA: Diagnosis not present

## 2022-04-24 DIAGNOSIS — Z950 Presence of cardiac pacemaker: Secondary | ICD-10-CM | POA: Diagnosis not present

## 2022-04-24 DIAGNOSIS — I351 Nonrheumatic aortic (valve) insufficiency: Secondary | ICD-10-CM | POA: Diagnosis not present

## 2022-04-24 DIAGNOSIS — I5032 Chronic diastolic (congestive) heart failure: Secondary | ICD-10-CM | POA: Diagnosis not present

## 2022-04-24 DIAGNOSIS — I420 Dilated cardiomyopathy: Secondary | ICD-10-CM | POA: Diagnosis not present

## 2022-04-24 DIAGNOSIS — I739 Peripheral vascular disease, unspecified: Secondary | ICD-10-CM | POA: Diagnosis not present

## 2022-04-24 DIAGNOSIS — I110 Hypertensive heart disease with heart failure: Secondary | ICD-10-CM | POA: Diagnosis not present

## 2022-04-24 DIAGNOSIS — I4891 Unspecified atrial fibrillation: Secondary | ICD-10-CM | POA: Diagnosis not present

## 2022-04-24 DIAGNOSIS — L97812 Non-pressure chronic ulcer of other part of right lower leg with fat layer exposed: Secondary | ICD-10-CM | POA: Diagnosis not present

## 2022-04-24 DIAGNOSIS — I872 Venous insufficiency (chronic) (peripheral): Secondary | ICD-10-CM | POA: Diagnosis not present

## 2022-04-24 DIAGNOSIS — I890 Lymphedema, not elsewhere classified: Secondary | ICD-10-CM | POA: Diagnosis not present

## 2022-04-25 ENCOUNTER — Other Ambulatory Visit (INDEPENDENT_AMBULATORY_CARE_PROVIDER_SITE_OTHER): Payer: Medicare HMO

## 2022-04-25 ENCOUNTER — Telehealth: Payer: Self-pay | Admitting: Hematology and Oncology

## 2022-04-25 DIAGNOSIS — E7800 Pure hypercholesterolemia, unspecified: Secondary | ICD-10-CM | POA: Diagnosis not present

## 2022-04-25 LAB — LIPID PANEL
Cholesterol: 110 mg/dL (ref 0–200)
HDL: 50 mg/dL (ref >39.00)
LDL Cholesterol: 44 mg/dL (ref 0–99)
NonHDL: 60
Total CHOL/HDL Ratio: 2
Triglycerides: 82 mg/dL (ref 0.0–149.0)
VLDL: 16.4 mg/dL (ref 0.0–40.0)

## 2022-04-25 LAB — COMPREHENSIVE METABOLIC PANEL
ALT: 19 U/L (ref 0–53)
AST: 64 U/L — ABNORMAL HIGH (ref 0–37)
Albumin: 4.3 g/dL (ref 3.5–5.2)
Alkaline Phosphatase: 39 U/L (ref 39–117)
BUN: 17 mg/dL (ref 6–23)
CO2: 31 mEq/L (ref 19–32)
Calcium: 9 mg/dL (ref 8.4–10.5)
Chloride: 103 mEq/L (ref 96–112)
Creatinine, Ser: 0.92 mg/dL (ref 0.40–1.50)
GFR: 82.43 mL/min (ref >60.00)
Glucose, Bld: 92 mg/dL (ref 70–99)
Potassium: 4.6 mEq/L (ref 3.5–5.1)
Sodium: 140 mEq/L (ref 135–145)
Total Bilirubin: 1.5 mg/dL — ABNORMAL HIGH (ref 0.2–1.2)
Total Protein: 6.2 g/dL (ref 6.0–8.3)

## 2022-04-25 NOTE — Progress Notes (Signed)
AMONTAE, NG ([id number]) [id number] Page 1 of 7 Visit Report for 04/24/2022 Arrival Information Details Patient Name: Date of Service: Gregory [name], [place] RD J. 04/24/2022 7:30 A M Medical Record Number: [id number] Patient Account Number: [id number] Date of Birth/Sex: Treating RN: 05-Jul-1950 (72 y.o. [name] Primary Care Katera Rybka: [name] Other Clinician: Referring Shakyra Mattera: Treating Prarthana Parlin/Extender: [name], [name] in Treatment: 2 Visit Information History Since Last Visit Added or deleted any medications: No Patient Arrived: [name] Any new allergies or adverse reactions: No Arrival Time: 07:40 Had a fall or experienced change in No Accompanied By: self activities of daily living that may affect Transfer Assistance: None risk of falls: Patient Identification Verified: Yes Signs or symptoms of abuse/neglect since last visito No Secondary Verification Process Completed: Yes Hospitalized since last visit: No Patient Requires Transmission-Based Precautions: No Implantable device outside of the clinic excluding No Patient Has Alerts: Yes cellular tissue based products placed in the center Patient Alerts: R ABI N/C since last visit: Has Dressing in Place as Prescribed: Yes Has Compression in Place as Prescribed: Yes Pain Present Now: No Electronic Signature(s) Signed: 04/24/2022 4:38:05 PM By: [name] RN, BSN Entered By: [name] on 04/24/2022 07:44:35 -------------------------------------------------------------------------------- Compression Therapy Details Patient Name: Date of Service: Gregory [name], Gregory RD J. 04/24/2022 7:30 A M Medical Record Number: [id number] Patient Account Number: [id number] Date of Birth/Sex: Treating RN: December 19, 1950 (72 y.o. [name] Primary Care Ailea Rhatigan: [name] Other Clinician: Referring Magnus Crescenzo: Treating Alven Alverio/Extender: [name], [name]  in Treatment: 2 Compression Therapy Performed for Wound Assessment: Wound #1 Right,Lateral Lower Leg Performed By: Clinician [name], RN Compression Type: Double Layer Post Procedure Diagnosis Same as Pre-procedure Notes urgo lite Electronic Signature(s) Signed: 04/24/2022 4:38:05 PM By: [name] RN, BSN Entered By: [name] on 04/24/2022 08:01:08 -------------------------------------------------------------------------------- Encounter Discharge Information Details Patient Name: Date of Service: Gregory [name], Gregory RD J. 04/24/2022 7:30 A M Medical Record Number: [id number] Patient Account Number: [id number] Date of Birth/Sex: Treating RN: 1950-01-22 (72 y.o. [name] Primary Care Amariona Rathje: [name] Other Clinician: Referring Maxamillion Banas: Treating Mathilda Maguire/Extender: [name], [name] in Treatment: 2 Encounter Discharge Information Items Post Procedure Vitals Discharge Condition: Stable Temperature (F): 97.7 Gregory [name], Gregory [name] ([id number]) [id number] Page 2 of 7 Ambulatory Status: Cane Pulse (bpm): 75 Discharge Destination: Home Respiratory Rate (breaths/min): 18 Transportation: Private Auto Blood Pressure (mmHg): 116/67 Accompanied By: self Schedule Follow-up Appointment: Yes Clinical Summary of Care: Patient Declined Electronic Signature(s) Signed: 04/24/2022 4:38:05 PM By: [name] RN, BSN Entered By: [name] on 04/24/2022 08:16:57 -------------------------------------------------------------------------------- Lower Extremity Assessment Details Patient Name: Date of Service: Gregory [name], Gregory RD J. 04/24/2022 7:30 A M Medical Record Number: [id number] Patient Account Number: [id number] Date of Birth/Sex: Treating RN: 1950/09/14 (72 y.o. [name] Primary Care Eathel Pajak: [name] Other Clinician: Referring Garrit Marrow: Treating Brandley Aldrete/Extender: [name],  [name] in Treatment: 2 Edema Assessment Assessed: [Left: No] [Right: No] Edema: [Left: Ye] [Right: s] Calf Left: Right: Point of Measurement: From Medial Instep 35.5 cm Ankle Left: Right: Point of Measurement: From Medial Instep 23 cm Vascular Assessment Pulses: Dorsalis Pedis Palpable: [Right:Yes] Electronic Signature(s) Signed: 04/24/2022 4:38:05 PM By: [name] RN, BSN Entered By: [name] on 04/24/2022 07:51:17 -------------------------------------------------------------------------------- Multi Wound Chart Details Patient Name: Date of Service: Gregory [name], Gregory RD J. 04/24/2022 7:30 A M Medical Record Number: [id number] Patient Account Number: [id number] Date of Birth/Sex: Treating RN: June 29, 1950 (72 y.o. M) Primary Care Marino Rogerson:  Ermalene Searing, Amy Other Clinician: Referring Delberta Folts: Treating Katheren Jimmerson/Extender: [name], [name] in Treatment: 2 Vital Signs Height(in): 66 Pulse(bpm): 75 Weight(lbs): 225 Blood Pressure(mmHg): 116/67 Body Mass Index(BMI): 36.3 Temperature(F): 97.7 Respiratory Rate(breaths/min): 18 [1:Photos:] [N/A:N/A] Right, Lateral Lower Leg N/A N/A Wound Location: Trauma N/A N/A Wounding Event: Venous Leg Ulcer N/A N/A Primary Etiology: Cataracts, Anemia, Arrhythmia, N/A N/A Comorbid History: Coronary Artery Disease, Hypertension, Myocardial Infarction, Peripheral Arterial Disease, Gout, Osteoarthritis, Confinement Anxiety 03/05/2022 N/A N/A Date Acquired: 2 N/A N/A Weeks of Treatment: Open N/A N/A Wound Status: No N/A N/A Wound Recurrence: 1.5x1.4x0.1 N/A N/A Measurements L x W x D (cm) 1.649 N/A N/A A (cm) : rea 0.165 N/A N/A Volume (cm) : 35.00% N/A N/A % Reduction in A rea: 35.00% N/A N/A % Reduction in Volume: Full Thickness Without Exposed N/A N/A Classification: Support Structures Medium N/A N/A Exudate A mount: Serosanguineous N/A N/A Exudate Type: red, brown N/A N/A Exudate  Color: Flat and Intact N/A N/A Wound Margin: Large (67-100%) N/A N/A Granulation A mount: Red N/A N/A Granulation Quality: Small (1-33%) N/A N/A Necrotic A mount: Fat Layer (Subcutaneous Tissue): Yes N/A N/A Exposed Structures: Fascia: No Tendon: No Muscle: No Joint: No Bone: No Small (1-33%) N/A N/A Epithelialization: Debridement - Selective/Open Wound N/A N/A Debridement: Pre-procedure Verification/Time Out 08:00 N/A N/A Taken: Lidocaine 4% Topical Solution N/A N/A Pain Control: Slough N/A N/A Tissue Debrided: Skin/Epidermis N/A N/A Level: 0.16 N/A N/A Debridement A (sq cm): rea Curette N/A N/A Instrument: Minimum N/A N/A Bleeding: Pressure N/A N/A Hemostasis A chieved: 0 N/A N/A Procedural Pain: 0 N/A N/A Post Procedural Pain: Procedure was tolerated well N/A N/A Debridement Treatment Response: 1.5x1.4x0.1 N/A N/A Post Debridement Measurements L x W x D (cm) 0.165 N/A N/A Post Debridement Volume: (cm) No Abnormalities Noted N/A N/A Periwound Skin Texture: No Abnormalities Noted N/A N/A Periwound Skin Moisture: Hemosiderin Staining: Yes N/A N/A Periwound Skin Color: No Abnormality N/A N/A Temperature: Yes N/A N/A Tenderness on Palpation: Compression Therapy N/A N/A Procedures Performed: Debridement Treatment Notes Electronic Signature(s) Signed: 04/24/2022 8:07:00 AM By: Duanne Guess MD FACS Entered By: Duanne Guess on 04/24/2022 08:07:00 -------------------------------------------------------------------------------- Multi-Disciplinary Care Plan Details Patient Name: Date of Service: Gregory [name], Gregory RD J. 04/24/2022 7:30 A M Medical Record Number: [id number] Patient Account Number: [id number] Date of Birth/Sex: Treating RN: 23-Aug-1950 (72 y.o. [name] Primary Care Ilze Roselli: [name] Other Clinician: Referring Shawan Corella: Treating Treshawn Allen/Extender: [name], [name] in Treatment: 2 Gregory [name], Gregory [name]  ([id number]) [id number] Page 4 of 7 Multidisciplinary Care Plan reviewed with physician Active Inactive Venous Leg Ulcer Nursing Diagnoses: Actual venous Insuffiency (use after diagnosis is confirmed) Knowledge deficit related to disease process and management Goals: Patient will maintain optimal edema control Date Initiated: 04/10/2022 Target Resolution Date: 05/08/2022 Goal Status: Active Interventions: Assess peripheral edema status every visit. Compression as ordered Treatment Activities: Therapeutic compression applied : 04/10/2022 Notes: Wound/Skin Impairment Nursing Diagnoses: Impaired tissue integrity Knowledge deficit related to ulceration/compromised skin integrity Goals: Patient/caregiver will verbalize understanding of skin care regimen Date Initiated: 04/10/2022 Target Resolution Date: 05/08/2022 Goal Status: Active Ulcer/skin breakdown will have a volume reduction of 30% by week 4 Date Initiated: 04/10/2022 Target Resolution Date: 05/08/2022 Goal Status: Active Interventions: Assess patient/caregiver ability to obtain necessary supplies Assess patient/caregiver ability to perform ulcer/skin care regimen upon admission and as needed Assess ulceration(s) every visit Provide education on ulcer and skin care Treatment Activities: Skin care regimen initiated : 04/10/2022 Topical wound management initiated : 04/10/2022  Notes: Electronic Signature(s) Signed: 04/24/2022 4:38:05 PM By: [name] RN, BSN Entered By: [name] on 04/24/2022 07:55:14 -------------------------------------------------------------------------------- Pain Assessment Details Patient Name: Date of Service: Gregory [name], Gregory RD J. 04/24/2022 7:30 A M Medical Record Number: [id number] Patient Account Number: [id number] Date of Birth/Sex: Treating RN: 1950-06-21 (72 y.o. [name] Primary Care Mickeal Daws: [name] Other Clinician: Referring Anastaisa Wooding: Treating  Beauford Lando/Extender: [name], [name] in Treatment: 2 Active Problems Location of Pain Severity and Description of Pain Patient Has Paino No Site Locations Rate the pain. Gregory [name], Gregory [name] ([id number]) [id number] Page 5 of 7 Rate the pain. Current Pain Level: 0 Pain Management and Medication Current Pain Management: Electronic Signature(s) Signed: 04/24/2022 4:38:05 PM By: [name] RN, BSN Entered By: [name] on 04/24/2022 07:45:23 -------------------------------------------------------------------------------- Patient/Caregiver Education Details Patient Name: Date of Service: Gregory [name], Gregory RD J. 4/23/2024andnbsp7:30 A M Medical Record Number: [id number] Patient Account Number: [id number] Date of Birth/Gender: Treating RN: Nov 15, 1950 (72 y.o. [name] Primary Care Physician: [name] Other Clinician: Referring Physician: Treating Physician/Extender: [name], [name] in Treatment: 2 Education Assessment Education Provided To: Patient Education Topics Provided Venous: Methods: Explain/Verbal Responses: Reinforcements needed, State content correctly Wound/Skin Impairment: Methods: Explain/Verbal Responses: Reinforcements needed, State content correctly Electronic Signature(s) Signed: 04/24/2022 4:38:05 PM By: [name] RN, BSN Entered By: [name] on 04/24/2022 07:55:37 -------------------------------------------------------------------------------- Wound Assessment Details Patient Name: Date of Service: Gregory [name], Gregory RD J. 04/24/2022 7:30 A M Medical Record Number: [id number] Patient Account Number: [id number] Date of Birth/Sex: Treating RN: 1950-05-14 (72 y.o. [name] Primary Care Cadon Raczka: [name] Other Clinician: Referring Jerid Catherman: Treating Nadra Hritz/Extender: [name], [name] in Treatment: 2 Wound Status Wound Number: 1  Primary Venous Leg Ulcer Etiology: Wound Location: Right, Lateral Lower Leg Gregory [name], Gregory [name] ([id number]) [id number] Page 6 of 7 Wound Open Wounding Event: Trauma Status: Date Acquired: 03/05/2022 Comorbid Cataracts, Anemia, Arrhythmia, Coronary Artery Disease, Weeks Of Treatment: 2 History: Hypertension, Myocardial Infarction, Peripheral Arterial Disease, Clustered Wound: No Gout, Osteoarthritis, Confinement Anxiety Photos Wound Measurements Length: (cm) 1.5 Width: (cm) 1.4 Depth: (cm) 0.1 Area: (cm) 1.649 Volume: (cm) 0.165 % Reduction in Area: 35% % Reduction in Volume: 35% Epithelialization: Small (1-33%) Tunneling: No Undermining: No Wound Description Classification: Full Thickness Without Exposed Support Structures Wound Margin: Flat and Intact Exudate Amount: Medium Exudate Type: Serosanguineous Exudate Color: red, brown Foul Odor After Cleansing: No Slough/Fibrino Yes Wound Bed Granulation Amount: Large (67-100%) Exposed Structure Granulation Quality: Red Fascia Exposed: No Necrotic Amount: Small (1-33%) Fat Layer (Subcutaneous Tissue) Exposed: Yes Necrotic Quality: Adherent Slough Tendon Exposed: No Muscle Exposed: No Joint Exposed: No Bone Exposed: No Periwound Skin Texture Texture Color No Abnormalities Noted: Yes No Abnormalities Noted: No Hemosiderin Staining: Yes Moisture No Abnormalities Noted: Yes Temperature / Pain Temperature: No Abnormality Tenderness on Palpation: Yes Treatment Notes Wound #1 (Lower Leg) Wound Laterality: Right, Lateral Cleanser Peri-Wound Care Sween Lotion (Moisturizing lotion) Discharge Instruction: Apply moisturizing lotion as directed Topical Primary Dressing Maxorb Extra Ag+ Alginate Dressing, 2x2 (in/in) Discharge Instruction: Apply to wound bed as instructed Secondary Dressing Woven Gauze Sponge, Non-Sterile 4x4 in Discharge Instruction: Apply over primary dressing as  directed. Secured With [employer], two layer compression system, regular Gregory [name], Gregory [name] ([id number]) [id number] Page 7 of 7 Discharge Instruction: Apply Urgo K2 Lite as directed (alternative to 3 layer compression). Compression Stockings Add-Ons Electronic Signature(s) Signed: 04/24/2022 4:38:05 PM By: [name] RN, BSN Entered By: Daneil Dan,  Linda on 04/24/2022 07:53:01 -------------------------------------------------------------------------------- Vitals Details Patient Name: Date of Service: Gregory [name], [place] RD J. 04/24/2022 7:30 A M Medical Record Number: [id number] Patient Account Number: [id number] Date of Birth/Sex: Treating RN: [birth year] (72 y.o. [name] Primary Care Mariacristina Aday: [name] Other Clinician: Referring Sherae Santino: Treating Ari Bernabei/Extender: [name], [name] in Treatment: 2 Vital Signs Time Taken: 07:44 Temperature (F): 97.7 Height (in): 66 Pulse (bpm): 75 Weight (lbs): 225 Respiratory Rate (breaths/min): 18 Body Mass Index (BMI): 36.3 Blood Pressure (mmHg): 116/67 Reference Range: 80 - 120 mg / dl Electronic Signature(s) Signed: 04/24/2022 4:38:05 PM By: [name] RN, BSN Entered By: [name] on 04/24/2022 07:45:12

## 2022-04-25 NOTE — Progress Notes (Signed)
BERNIS, SCHREUR ([id number]) [id number] Page 1 of 8 Visit Report for 04/24/2022 Chief Complaint Document Details Patient Name: Date of Service: Gregory [name], [place] RD J. 04/24/2022 7:30 A M Medical Record Number: [id number] Patient Account Number: [id number] Date of Birth/Sex: Treating RN: 24-Dec-1950 (72 y.o. M) Primary Care Provider: [name] Other Clinician: Referring Provider: Treating Provider/Extender: [name], [name] in Treatment: 2 Information Obtained from: Patient Chief Complaint Patient seen for complaints of Non-Healing Wound. Electronic Signature(s) Signed: 04/24/2022 8:07:06 AM By: Duanne Guess MD FACS Entered By: Duanne Guess on 04/24/2022 08:07:06 -------------------------------------------------------------------------------- Debridement Details Patient Name: Date of Service: Gregory [name], Gregory RD J. 04/24/2022 7:30 A M Medical Record Number: [id number] Patient Account Number: [id number] Date of Birth/Sex: Treating RN: February 24, 1950 (72 y.o. Gregory [name] Primary Care Provider: [name] Other Clinician: Referring Provider: Treating Provider/Extender: [name], [name] in Treatment: 2 Debridement Performed for Assessment: Wound #1 Right,Lateral Lower Leg Performed By: Physician Duanne Guess, MD Debridement Type: Debridement Severity of Tissue Pre Debridement: Fat layer exposed Level of Consciousness (Pre-procedure): Awake and Alert Pre-procedure Verification/Time Out Yes - 08:00 Taken: Start Time: 08:00 Pain Control: Lidocaine 4% T opical Solution Percent of Wound Bed Debrided: 10% T Area Debrided (cm): otal 0.16 Tissue and other material debrided: Non-Viable, Slough, Skin: Epidermis, Slough Level: Skin/Epidermis Debridement Description: Selective/Open Wound Instrument: Curette Bleeding: Minimum Hemostasis Achieved: Pressure Procedural Pain: 0 Post Procedural Pain:  0 Response to Treatment: Procedure was tolerated well Level of Consciousness (Post- Awake and Alert procedure): Post Debridement Measurements of Total Wound Length: (cm) 1.5 Width: (cm) 1.4 Depth: (cm) 0.1 Volume: (cm) 0.165 Character of Wound/Ulcer Post Debridement: Improved Severity of Tissue Post Debridement: Fat layer exposed Post Procedure Diagnosis Same as Pre-procedure Notes scribed for Dr. Lady Gary by [name], RN Electronic Signature(s) Gregory [name] ([id number]) [phone].pdf Page 2 of 8 Signed: 04/24/2022 11:24:22 AM By: Duanne Guess MD FACS Signed: 04/24/2022 4:38:05 PM By: [name] RN, BSN Entered By: [name] on 04/24/2022 08:02:20 -------------------------------------------------------------------------------- HPI Details Patient Name: Date of Service: Gregory [name], Gregory RD J. 04/24/2022 7:30 A M Medical Record Number: [id number] Patient Account Number: [id number] Date of Birth/Sex: Treating RN: 21-Aug-1950 (72 y.o. M) Primary Care Provider: [name] Other Clinician: Referring Provider: Treating Provider/Extender: [name], [name] in Treatment: 2 History of Present Illness HPI Description: ADMISSION 04/10/2022 This is a 72 year old man with a past medical history significant for hypertension, severe aortic insufficiency status post aortic valvuloplasty, grade 1 diastolic dysfunction, peripheral vascular disease, permanent pacemaker, lymphedema, and coronary artery disease. He dropped a cinder block which struck his leg at the beginning of March 2024. He sought care in the ED on March 4. Plain films did not demonstrate any fracture or foreign body. Steri-Strips were applied. Apparently the patient was soaking the wound with hydrogen peroxide daily. He saw his PCP who gave him an IM injection of ceftriaxone and prescribed doxycycline. He was advised to elevate his legs and clean with just warm soapy  water and apply antibiotic ointment. Formal ABIs were done in February 2024 that showed noncompressible ABIs bilaterally and a TBI of 0.6 on the right and 0.59 on the left. There is a triangular wound on his left lower leg, just lateral to the anterior tibial surface. There is slough and nonviable fat present. No concern for infection. 04/16/2022: The wound is about the same size, but much cleaner this week. There is a little bit of slough on the surface. There is hypertrophic  granulation tissue present. Edema control is good. 04/24/2022: His wound is smaller and cleaner today. Edema control is good. Minimal slough accumulation. Healthy-looking granulation tissue present. Electronic Signature(s) Signed: 04/24/2022 8:07:45 AM By: Duanne Guess MD FACS Entered By: Duanne Guess on 04/24/2022 08:07:44 -------------------------------------------------------------------------------- Physical Exam Details Patient Name: Date of Service: Gregory [name], Gregory RD J. 04/24/2022 7:30 A M Medical Record Number: [id number] Patient Account Number: [id number] Date of Birth/Sex: Treating RN: July 06, 1950 (72 y.o. M) Primary Care Provider: [name] Other Clinician: Referring Provider: Treating Provider/Extender: [name], [name] in Treatment: 2 Constitutional . . . . no acute distress. Respiratory Normal work of breathing on room air. Notes 04/24/2022: His wound is smaller and cleaner today. Edema control is good. Minimal slough accumulation. Healthy-looking granulation tissue present. Electronic Signature(s) Signed: 04/24/2022 8:08:59 AM By: Duanne Guess MD FACS Entered By: Duanne Guess on 04/24/2022 08:08:58 -------------------------------------------------------------------------------- Physician Orders Details Patient Name: Date of Service: Gregory [name], Gregory RD J. 04/24/2022 7:30 A M Medical Record Number: [id number] Patient Account Number: [id number] Date of Birth/Sex:  Treating RN: Aug 26, 1950 (72 y.o. Gregory [name] Primary Care Provider: [name] Other Clinician: Referring Provider: Treating Provider/Extender: [name], [name] in Treatment: 2 Gregory [name], Gregory [name] ([id number]) [id number] Page 3 of 8 Verbal / Phone Orders: No Diagnosis Coding ICD-10 Coding Code Description [phone] Non-pressure chronic ulcer of other part of right lower leg with fat layer exposed I87.2 Venous insufficiency (chronic) (peripheral) I73.9 Peripheral vascular disease, unspecified I42.0 Dilated cardiomyopathy I35.1 Nonrheumatic aortic (valve) insufficiency I50.30 Unspecified diastolic (congestive) heart failure Follow-up Appointments ppointment in 1 week. - Dr. Lady Gary RM 1 Return A Tuesday 4/30 @ 0730 am Anesthetic Wound #1 Right,Lateral Lower Leg (In clinic) Topical Lidocaine 4% applied to wound bed Bathing/ Shower/ Hygiene May shower with protection but do not get wound dressing(s) wet. Protect dressing(s) with water repellant cover (for example, large plastic bag) or a cast cover and may then take shower. - may be purchased at [facility] or [facility] Edema Control - Lymphedema / SCD / Other Elevate legs to the level of the heart or above for 30 minutes daily and/or when sitting for 3-4 times a day throughout the day. - throughout the day Avoid standing for long periods of time. Exercise regularly Compression stocking or Garment 20-30 mm/Hg pressure to: - left leg daily Wound Treatment Wound #1 - Lower Leg Wound Laterality: Right, Lateral Peri-Wound Care: Sween Lotion (Moisturizing lotion) 1 x Per Week/30 Days Discharge Instructions: Apply moisturizing lotion as directed Prim Dressing: Maxorb Extra Ag+ Alginate Dressing, 2x2 (in/in) 1 x Per Week/30 Days ary Discharge Instructions: Apply to wound bed as instructed Secondary Dressing: Woven Gauze Sponge, Non-Sterile 4x4 in 1 x Per Week/30 Days Discharge Instructions:  Apply over primary dressing as directed. Compression Wrap: Urgo K2 Lite, two layer compression system, regular 1 x Per Week/30 Days Discharge Instructions: Apply Urgo K2 Lite as directed (alternative to 3 layer compression). Electronic Signature(s) Signed: 04/24/2022 11:24:22 AM By: Duanne Guess MD FACS Entered By: Duanne Guess on 04/24/2022 08:10:32 -------------------------------------------------------------------------------- Problem List Details Patient Name: Date of Service: Gregory [name], Gregory RD J. 04/24/2022 7:30 A M Medical Record Number: [id number] Patient Account Number: [id number] Date of Birth/Sex: Treating RN: 1950-11-14 (72 y.o. Gregory [name] Primary Care Provider: [name] Other Clinician: Referring Provider: Treating Provider/Extender: [name], [name] in Treatment: 2 Active Problems ICD-10 Encounter Code Description Active Date MDM Diagnosis [phone] Non-pressure chronic ulcer of other part of right lower leg with fat  layer 04/10/2022 No Yes exposed Gregory [name], Gregory [name] ([id number]) [id number] Page 4 of 8 I87.2 Venous insufficiency (chronic) (peripheral) 04/10/2022 No Yes I73.9 Peripheral vascular disease, unspecified 04/10/2022 No Yes I42.0 Dilated cardiomyopathy 04/10/2022 No Yes I35.1 Nonrheumatic aortic (valve) insufficiency 04/10/2022 No Yes I50.30 Unspecified diastolic (congestive) heart failure 04/10/2022 No Yes Inactive Problems Resolved Problems Electronic Signature(s) Signed: 04/24/2022 8:06:54 AM By: Duanne Guess MD FACS Entered By: Duanne Guess on 04/24/2022 08:06:53 -------------------------------------------------------------------------------- Progress Note Details Patient Name: Date of Service: Gregory [name], Gregory RD J. 04/24/2022 7:30 A M Medical Record Number: [id number] Patient Account Number: [id number] Date of Birth/Sex: Treating RN: [birth year] (72 y.o. M) Primary Care Provider: [name]  Other Clinician: Referring Provider: Treating Provider/Extender: [name], [name] in Treatment: 2 Subjective Chief Complaint Information obtained from Patient Patient seen for complaints of Non-Healing Wound. History of Present Illness (HPI) ADMISSION 04/10/2022 This is a 72 year old man with a past medical history significant for hypertension, severe aortic insufficiency status post aortic valvuloplasty, grade 1 diastolic dysfunction, peripheral vascular disease, permanent pacemaker, lymphedema, and coronary artery disease. He dropped a cinder block which struck his leg at the beginning of March 2024. He sought care in the ED on March 4. Plain films did not demonstrate any fracture or foreign body. Steri-Strips were applied. Apparently the patient was soaking the wound with hydrogen peroxide daily. He saw his PCP who gave him an IM injection of ceftriaxone and prescribed doxycycline. He was advised to elevate his legs and clean with just warm soapy water and apply antibiotic ointment. Formal ABIs were done in February 2024 that showed noncompressible ABIs bilaterally and a TBI of 0.6 on the right and 0.59 on the left. There is a triangular wound on his left lower leg, just lateral to the anterior tibial surface. There is slough and nonviable fat present. No concern for infection. 04/16/2022: The wound is about the same size, but much cleaner this week. There is a little bit of slough on the surface. There is hypertrophic granulation tissue present. Edema control is good. 04/24/2022: His wound is smaller and cleaner today. Edema control is good. Minimal slough accumulation. Healthy-looking granulation tissue present. Patient History Information obtained from Patient, Chart. Family History Unknown History. Social History Never smoker, Marital Status - Married, Alcohol Use - Moderate, Drug Use - Current History - occaisional TCH, Caffeine Use - Moderate. Medical  History Eyes Patient has history of Cataracts - bil extractions Ear/Nose/Mouth/Throat Gregory [name], Gregory [name] ([id number]) [id number] Page 5 of 8 Denies history of Chronic sinus problems/congestion, Middle ear problems Hematologic/Lymphatic Patient has history of Anemia - macrocytic Cardiovascular Patient has history of Arrhythmia - afib, complete heart block, Coronary Artery Disease, Hypertension, Myocardial Infarction, Peripheral Arterial Disease Endocrine Denies history of Type I Diabetes, Type II Diabetes Genitourinary Denies history of End Stage Renal Disease Integumentary (Skin) Denies history of History of Burn Musculoskeletal Patient has history of Gout, Osteoarthritis Denies history of Rheumatoid Arthritis, Osteomyelitis Oncologic Denies history of Received Chemotherapy, Received Radiation Psychiatric Patient has history of Confinement Anxiety Denies history of Anorexia/bulimia Hospitalization/Surgery History - pacemaker insertion 10/2020. - balloon aortic valve valvuloplasty. - CABG 03/2020. - aortic valve replacement. - cataract extraction. Medical A Surgical History Notes nd Cardiovascular aortic insufficiency, cardiomyopathy, pacemaker Objective Constitutional no acute distress. Vitals Time Taken: 7:44 AM, Height: 66 in, Weight: 225 lbs, BMI: 36.3, Temperature: 97.7 F, Pulse: 75 bpm, Respiratory Rate: 18 breaths/min, Blood Pressure: 116/67 mmHg. Respiratory Normal work of breathing on room air. General Notes: 04/24/2022: His wound  is smaller and cleaner today. Edema control is good. Minimal slough accumulation. Healthy-looking granulation tissue present. Integumentary (Hair, Skin) Wound #1 status is Open. Original cause of wound was Trauma. The date acquired was: 03/05/2022. The wound has been in treatment 2 weeks. The wound is located on the Right,Lateral Lower Leg. The wound measures 1.5cm length x 1.4cm width x 0.1cm depth; 1.649cm^2 area  and 0.165cm^3 volume. There is Fat Layer (Subcutaneous Tissue) exposed. There is no tunneling or undermining noted. There is a medium amount of serosanguineous drainage noted. The wound margin is flat and intact. There is large (67-100%) red granulation within the wound bed. There is a small (1-33%) amount of necrotic tissue within the wound bed including Adherent Slough. The periwound skin appearance had no abnormalities noted for texture. The periwound skin appearance had no abnormalities noted for moisture. The periwound skin appearance exhibited: Hemosiderin Staining. Periwound temperature was noted as No Abnormality. The periwound has tenderness on palpation. Assessment Active Problems ICD-10 Non-pressure chronic ulcer of other part of right lower leg with fat layer exposed Venous insufficiency (chronic) (peripheral) Peripheral vascular disease, unspecified Dilated cardiomyopathy Nonrheumatic aortic (valve) insufficiency Unspecified diastolic (congestive) heart failure Procedures Wound #1 Pre-procedure diagnosis of Wound #1 is a Venous Leg Ulcer located on the Right,Lateral Lower Leg .Severity of Tissue Pre Debridement is: Fat layer exposed. There was a Selective/Open Wound Skin/Epidermis Debridement with a total area of 0.16 sq cm performed by Duanne Guess, MD. With the following instrument(s): Curette to remove Non-Viable tissue/material. Material removed includes [facility] and Skin: Epidermis and after achieving pain control using Gregory [name], Gregory [name] ([id number]) [id number] Page 6 of 8 Lidocaine 4% Topical Solution. No specimens were taken. A time out was conducted at 08:00, prior to the start of the procedure. A Minimum amount of bleeding was controlled with Pressure. The procedure was tolerated well with a pain level of 0 throughout and a pain level of 0 following the procedure. Post Debridement Measurements: 1.5cm length x 1.4cm width x 0.1cm depth;  0.165cm^3 volume. Character of Wound/Ulcer Post Debridement is improved. Severity of Tissue Post Debridement is: Fat layer exposed. Post procedure Diagnosis Wound #1: Same as Pre-Procedure General Notes: scribed for Dr. Lady Gary by [name], RN. Pre-procedure diagnosis of Wound #1 is a Venous Leg Ulcer located on the Right,Lateral Lower Leg . There was a Double Layer Compression Therapy Procedure by [name], RN. Post procedure Diagnosis Wound #1: Same as Pre-Procedure Notes: urgo lite. Plan Follow-up Appointments: Return Appointment in 1 week. - Dr. Lady Gary RM 1 Tuesday 4/30 @ 0730 am Anesthetic: Wound #1 Right,Lateral Lower Leg: (In clinic) Topical Lidocaine 4% applied to wound bed Bathing/ Shower/ Hygiene: May shower with protection but do not get wound dressing(s) wet. Protect dressing(s) with water repellant cover (for example, large plastic bag) or a cast cover and may then take shower. - may be purchased at [facility] or [facility] Edema Control - Lymphedema / SCD / Other: Elevate legs to the level of the heart or above for 30 minutes daily and/or when sitting for 3-4 times a day throughout the day. - throughout the day Avoid standing for long periods of time. Exercise regularly Compression stocking or Garment 20-30 mm/Hg pressure to: - left leg daily WOUND #1: - Lower Leg Wound Laterality: Right, Lateral Peri-Wound Care: Sween Lotion (Moisturizing lotion) 1 x Per Week/30 Days Discharge Instructions: Apply moisturizing lotion as directed Prim Dressing: Maxorb Extra Ag+ Alginate Dressing, 2x2 (in/in) 1 x Per Week/30 Days ary Discharge Instructions: Apply to  wound bed as instructed Secondary Dressing: Woven Gauze Sponge, Non-Sterile 4x4 in 1 x Per Week/30 Days Discharge Instructions: Apply over primary dressing as directed. Com pression Wrap: Urgo K2 Lite, two layer compression system, regular 1 x Per Week/30 Days Discharge Instructions: Apply Urgo K2 Lite as directed  (alternative to 3 layer compression). 04/24/2022: His wound is smaller and cleaner today. Edema control is good. Minimal slough accumulation. Healthy-looking granulation tissue present. I used a curette to debride the slough from his wound. We will continue silver alginate and 3 layer compression equivalent. Follow-up in 1 week. Electronic Signature(s) Signed: 04/24/2022 8:11:12 AM By: Duanne Guess MD FACS Entered By: Duanne Guess on 04/24/2022 08:11:12 -------------------------------------------------------------------------------- HxROS Details Patient Name: Date of Service: Gregory [name], Gregory RD J. 04/24/2022 7:30 A M Medical Record Number: [id number] Patient Account Number: [id number] Date of Birth/Sex: Treating RN: May 13, 1950 (72 y.o. M) Primary Care Provider: [name] Other Clinician: Referring Provider: Treating Provider/Extender: [name], [name] in Treatment: 2 Information Obtained From Patient Chart Eyes Medical History: Positive for: Cataracts - bil extractions Ear/Nose/Mouth/Throat Medical History: Negative for: Chronic sinus problems/congestion; Middle ear problems Hematologic/Lymphatic Medical History: Gregory [name], Gregory [name] ([id number]) [id number] Page 7 of 8 Positive for: Anemia - macrocytic Cardiovascular Medical History: Positive for: Arrhythmia - afib, complete heart block; Coronary Artery Disease; Hypertension; Myocardial Infarction; Peripheral Arterial Disease Past Medical History Notes: aortic insufficiency, cardiomyopathy, pacemaker Endocrine Medical History: Negative for: Type I Diabetes; Type II Diabetes Genitourinary Medical History: Negative for: End Stage Renal Disease Integumentary (Skin) Medical History: Negative for: History of Burn Musculoskeletal Medical History: Positive for: Gout; Osteoarthritis Negative for: Rheumatoid Arthritis; Osteomyelitis Oncologic Medical History: Negative for:  Received Chemotherapy; Received Radiation Psychiatric Medical History: Positive for: Confinement Anxiety Negative for: Anorexia/bulimia HBO Extended History Items Eyes: Cataracts Immunizations Pneumococcal Vaccine: Received Pneumococcal Vaccination: Yes Received Pneumococcal Vaccination On or After 60th Birthday: Yes Implantable Devices Yes Hospitalization / Surgery History Type of Hospitalization/Surgery pacemaker insertion 10/2020 balloon aortic valve valvuloplasty CABG 03/2020 aortic valve replacement cataract extraction Family and Social History Unknown History: Yes; Never smoker; Marital Status - Married; Alcohol Use: Moderate; Drug Use: Current History - occaisional TCH; Caffeine Use: Moderate; Financial Concerns: No; Food, Clothing or Shelter Needs: No; Support System Lacking: No; Transportation Concerns: No Electronic Signature(s) Signed: 04/24/2022 11:24:22 AM By: Duanne Guess MD FACS Entered By: Duanne Guess on 04/24/2022 08:07:57 SuperBill Details -------------------------------------------------------------------------------- Gregory [name] ([id number]) [id number] Page 8 of 8 Patient Name: Date of Service: Gregory [name], [place] RD J. 04/24/2022 Medical Record Number: [id number] Patient Account Number: [id number] Date of Birth/Sex: Treating RN: 08-Aug-1950 (72 y.o. M) Primary Care Provider: [name] Other Clinician: Referring Provider: Treating Provider/Extender: [name], [name] in Treatment: 2 Diagnosis Coding ICD-10 Codes Code Description [id number] Non-pressure chronic ulcer of other part of right lower leg with fat layer exposed I87.2 Venous insufficiency (chronic) (peripheral) I73.9 Peripheral vascular disease, unspecified I42.0 Dilated cardiomyopathy I35.1 Nonrheumatic aortic (valve) insufficiency I50.30 Unspecified diastolic (congestive) heart failure Facility Procedures CPT4 Code Description Modifier  Quantity [id number] [phone] - DEBRIDE WOUND 1ST 20 SQ CM OR < 1 ICD-10 Diagnosis Description L97.812 Non-pressure chronic ulcer of other part of right lower leg with fat layer exposed Physician Procedures Quantity CPT4 Code Description Modifier [id number] - WC PHYS LEVEL 3 - EST PT 25 1 ICD-10 Diagnosis Description L97.812 Non-pressure chronic ulcer of other part of right lower leg with fat layer exposed I87.2 Venous insufficiency (chronic) (peripheral) I73.9 Peripheral vascular disease, unspecified I50.30 Unspecified diastolic (congestive)  heart failure [id number] - WC PHYS DEBR WO ANESTH 20 SQ CM 1 ICD-10 Diagnosis Description L97.812 Non-pressure chronic ulcer of other part of right lower leg with fat layer exposed Electronic Signature(s) Signed: 04/24/2022 8:11:29 AM By: Duanne Guess MD FACS Entered By: Duanne Guess on 04/24/2022 08:11:29

## 2022-04-26 ENCOUNTER — Inpatient Hospital Stay: Payer: Medicare HMO

## 2022-04-26 ENCOUNTER — Other Ambulatory Visit: Payer: Self-pay

## 2022-04-26 ENCOUNTER — Other Ambulatory Visit: Payer: Self-pay | Admitting: Family Medicine

## 2022-04-26 VITALS — BP 140/73 | HR 72 | Temp 98.2°F | Resp 18

## 2022-04-26 DIAGNOSIS — E538 Deficiency of other specified B group vitamins: Secondary | ICD-10-CM

## 2022-04-26 DIAGNOSIS — D519 Vitamin B12 deficiency anemia, unspecified: Secondary | ICD-10-CM | POA: Diagnosis not present

## 2022-04-26 DIAGNOSIS — D509 Iron deficiency anemia, unspecified: Secondary | ICD-10-CM

## 2022-04-26 DIAGNOSIS — Z952 Presence of prosthetic heart valve: Secondary | ICD-10-CM

## 2022-04-26 DIAGNOSIS — I4891 Unspecified atrial fibrillation: Secondary | ICD-10-CM

## 2022-04-26 DIAGNOSIS — D539 Nutritional anemia, unspecified: Secondary | ICD-10-CM | POA: Diagnosis not present

## 2022-04-26 DIAGNOSIS — D638 Anemia in other chronic diseases classified elsewhere: Secondary | ICD-10-CM | POA: Diagnosis not present

## 2022-04-26 LAB — CBC WITH DIFFERENTIAL (CANCER CENTER ONLY)
Abs Immature Granulocytes: 0.03 10*3/uL (ref 0.00–0.07)
Basophils Absolute: 0 10*3/uL (ref 0.0–0.1)
Basophils Relative: 1 %
Eosinophils Absolute: 0.1 10*3/uL (ref 0.0–0.5)
Eosinophils Relative: 2 %
HCT: 33.7 % — ABNORMAL LOW (ref 39.0–52.0)
Hemoglobin: 10.8 g/dL — ABNORMAL LOW (ref 13.0–17.0)
Immature Granulocytes: 1 %
Lymphocytes Relative: 29 %
Lymphs Abs: 1.2 10*3/uL (ref 0.7–4.0)
MCH: 32 pg (ref 26.0–34.0)
MCHC: 32 g/dL (ref 30.0–36.0)
MCV: 99.7 fL (ref 80.0–100.0)
Monocytes Absolute: 0.4 10*3/uL (ref 0.1–1.0)
Monocytes Relative: 9 %
Neutro Abs: 2.5 10*3/uL (ref 1.7–7.7)
Neutrophils Relative %: 58 %
Platelet Count: 201 10*3/uL (ref 150–400)
RBC: 3.38 MIL/uL — ABNORMAL LOW (ref 4.22–5.81)
RDW: 15.6 % — ABNORMAL HIGH (ref 11.5–15.5)
WBC Count: 4.2 10*3/uL (ref 4.0–10.5)
nRBC: 0 % (ref 0.0–0.2)

## 2022-04-26 MED ORDER — SODIUM CHLORIDE 0.9 % IV SOLN
400.0000 mg | Freq: Once | INTRAVENOUS | Status: AC
  Administered 2022-04-26: 400 mg via INTRAVENOUS
  Filled 2022-04-26: qty 20

## 2022-04-26 MED ORDER — SODIUM CHLORIDE 0.9 % IV SOLN: Freq: Once | INTRAVENOUS | Status: AC

## 2022-04-26 MED ORDER — CYANOCOBALAMIN 1000 MCG/ML IJ SOLN
1000.0000 ug | Freq: Once | INTRAMUSCULAR | Status: AC
  Administered 2022-04-26: 1000 ug via INTRAMUSCULAR
  Filled 2022-04-26: qty 1

## 2022-04-26 NOTE — Progress Notes (Signed)
No critical labs need to be addressed urgently. We will discuss labs in detail at upcoming office visit.   

## 2022-04-26 NOTE — Patient Instructions (Signed)

## 2022-04-26 NOTE — Telephone Encounter (Signed)
Last office visit 03/21/2022 for cellulitis.  Last refilled 02/20/2022 for #90 with no refills.  Next appt; CPE 05/02/2022.

## 2022-04-27 ENCOUNTER — Telehealth: Payer: Self-pay | Admitting: Hematology and Oncology

## 2022-04-27 NOTE — Telephone Encounter (Signed)
Scheduled appointment per 4/25 secure chat. Patient is aware of the made appointment.

## 2022-04-30 ENCOUNTER — Inpatient Hospital Stay: Payer: Medicare HMO

## 2022-04-30 DIAGNOSIS — E538 Deficiency of other specified B group vitamins: Secondary | ICD-10-CM

## 2022-04-30 DIAGNOSIS — D519 Vitamin B12 deficiency anemia, unspecified: Secondary | ICD-10-CM | POA: Diagnosis not present

## 2022-04-30 DIAGNOSIS — D509 Iron deficiency anemia, unspecified: Secondary | ICD-10-CM | POA: Diagnosis not present

## 2022-04-30 DIAGNOSIS — D539 Nutritional anemia, unspecified: Secondary | ICD-10-CM | POA: Diagnosis not present

## 2022-04-30 DIAGNOSIS — D638 Anemia in other chronic diseases classified elsewhere: Secondary | ICD-10-CM | POA: Diagnosis not present

## 2022-04-30 LAB — CBC WITH DIFFERENTIAL (CANCER CENTER ONLY)
Abs Immature Granulocytes: 0.01 10*3/uL (ref 0.00–0.07)
Basophils Absolute: 0 10*3/uL (ref 0.0–0.1)
Basophils Relative: 0 %
Eosinophils Absolute: 0.1 10*3/uL (ref 0.0–0.5)
Eosinophils Relative: 2 %
HCT: 35.1 % — ABNORMAL LOW (ref 39.0–52.0)
Hemoglobin: 11 g/dL — ABNORMAL LOW (ref 13.0–17.0)
Immature Granulocytes: 0 %
Lymphocytes Relative: 29 %
Lymphs Abs: 1.4 10*3/uL (ref 0.7–4.0)
MCH: 31.7 pg (ref 26.0–34.0)
MCHC: 31.3 g/dL (ref 30.0–36.0)
MCV: 101.2 fL — ABNORMAL HIGH (ref 80.0–100.0)
Monocytes Absolute: 0.4 10*3/uL (ref 0.1–1.0)
Monocytes Relative: 9 %
Neutro Abs: 2.9 10*3/uL (ref 1.7–7.7)
Neutrophils Relative %: 60 %
Platelet Count: 231 10*3/uL (ref 150–400)
RBC: 3.47 MIL/uL — ABNORMAL LOW (ref 4.22–5.81)
RDW: 16.1 % — ABNORMAL HIGH (ref 11.5–15.5)
WBC Count: 4.9 10*3/uL (ref 4.0–10.5)
nRBC: 0 % (ref 0.0–0.2)

## 2022-04-30 LAB — SAMPLE TO BLOOD BANK

## 2022-04-30 MED ORDER — CYANOCOBALAMIN 1000 MCG/ML IJ SOLN
1000.0000 ug | Freq: Once | INTRAMUSCULAR | Status: AC
  Administered 2022-04-30: 1000 ug via INTRAMUSCULAR
  Filled 2022-04-30: qty 1

## 2022-04-30 NOTE — Progress Notes (Signed)
Pt here for B12 and retacrit injection.  Parameters met for retacrit, no need for injection. Pt agrees and understands.

## 2022-05-01 ENCOUNTER — Encounter (HOSPITAL_BASED_OUTPATIENT_CLINIC_OR_DEPARTMENT_OTHER): Payer: Medicare HMO | Admitting: General Surgery

## 2022-05-01 DIAGNOSIS — I351 Nonrheumatic aortic (valve) insufficiency: Secondary | ICD-10-CM | POA: Diagnosis not present

## 2022-05-01 DIAGNOSIS — I420 Dilated cardiomyopathy: Secondary | ICD-10-CM | POA: Diagnosis not present

## 2022-05-01 DIAGNOSIS — L97812 Non-pressure chronic ulcer of other part of right lower leg with fat layer exposed: Secondary | ICD-10-CM | POA: Diagnosis not present

## 2022-05-01 DIAGNOSIS — I872 Venous insufficiency (chronic) (peripheral): Secondary | ICD-10-CM | POA: Diagnosis not present

## 2022-05-01 DIAGNOSIS — I4891 Unspecified atrial fibrillation: Secondary | ICD-10-CM | POA: Diagnosis not present

## 2022-05-01 DIAGNOSIS — Z950 Presence of cardiac pacemaker: Secondary | ICD-10-CM | POA: Diagnosis not present

## 2022-05-01 DIAGNOSIS — I2510 Atherosclerotic heart disease of native coronary artery without angina pectoris: Secondary | ICD-10-CM | POA: Diagnosis not present

## 2022-05-01 DIAGNOSIS — I5032 Chronic diastolic (congestive) heart failure: Secondary | ICD-10-CM | POA: Diagnosis not present

## 2022-05-01 DIAGNOSIS — I739 Peripheral vascular disease, unspecified: Secondary | ICD-10-CM | POA: Diagnosis not present

## 2022-05-01 DIAGNOSIS — I110 Hypertensive heart disease with heart failure: Secondary | ICD-10-CM | POA: Diagnosis not present

## 2022-05-01 DIAGNOSIS — I890 Lymphedema, not elsewhere classified: Secondary | ICD-10-CM | POA: Diagnosis not present

## 2022-05-02 ENCOUNTER — Ambulatory Visit (INDEPENDENT_AMBULATORY_CARE_PROVIDER_SITE_OTHER): Payer: Medicare HMO | Admitting: Family Medicine

## 2022-05-02 ENCOUNTER — Encounter: Payer: Self-pay | Admitting: Family Medicine

## 2022-05-02 VITALS — BP 110/58 | HR 82 | Temp 98.0°F | Ht 66.0 in | Wt 231.0 lb

## 2022-05-02 DIAGNOSIS — I480 Paroxysmal atrial fibrillation: Secondary | ICD-10-CM

## 2022-05-02 DIAGNOSIS — F411 Generalized anxiety disorder: Secondary | ICD-10-CM

## 2022-05-02 DIAGNOSIS — Z0000 Encounter for general adult medical examination without abnormal findings: Secondary | ICD-10-CM | POA: Diagnosis not present

## 2022-05-02 DIAGNOSIS — I420 Dilated cardiomyopathy: Secondary | ICD-10-CM | POA: Diagnosis not present

## 2022-05-02 DIAGNOSIS — I10 Essential (primary) hypertension: Secondary | ICD-10-CM | POA: Diagnosis not present

## 2022-05-02 DIAGNOSIS — M1A09X0 Idiopathic chronic gout, multiple sites, without tophus (tophi): Secondary | ICD-10-CM

## 2022-05-02 DIAGNOSIS — E785 Hyperlipidemia, unspecified: Secondary | ICD-10-CM

## 2022-05-02 DIAGNOSIS — R7303 Prediabetes: Secondary | ICD-10-CM | POA: Diagnosis not present

## 2022-05-02 NOTE — Assessment & Plan Note (Addendum)
Resolved

## 2022-05-02 NOTE — Patient Instructions (Addendum)
Continue working on decreasing alcohol intake.

## 2022-05-02 NOTE — Progress Notes (Signed)
Patient ID: Gregory [name], male    DOB: [birth year], 72 y.o.   MRN: [id number]  This visit was conducted in person.  BP (!) 110/58 (BP Location: Left Arm, Patient Position: Sitting, Cuff Size: Normal)   Pulse 82   Temp 98 F (36.7 C) (Temporal)   Ht 5\' 6"  (1.676 m)   Wt 231 lb (104.8 kg)   SpO2 92%   BMI 37.28 kg/m    CC:   Chief Complaint  Patient presents with   Medicare Wellness    Part 2    Subjective:   HPI: Gregory [name] is a 72 y.o. male presenting on 05/02/2022 for Medicare Wellness (Part 2)  The patient presents for complete physical and review of chronic health problems. He/She also has the following acute concerns today:   I have personally reviewed the Medicare Annual Wellness questionnaire and have noted 1. The patient's medical and social history 2. Their use of alcohol, tobacco or illicit drugs 3. Their current medications and supplements 4. The patient's functional ability including ADL's, fall risks, home safety risks and hearing or visual             impairment. 5. Diet and physical activities 6. Evidence for depression or mood disorders 7.         Updated provider list Cognitive evaluation was performed and recorded on pt medicare questionnaire form. The patients weight, height, BMI and visual acuity have been recorded in the chart   I have made referrals, counseling and provided education to the patient based review of the above and I have provided the pt with a written personalized care plan for preventive services.   Documentation of this information was scanned into the electronic record under the media tab.   Advance directives and end of life planning reviewed in detail with patient and documented in EMR. Patient given handout on advance care directives if needed. HCPOA and living will updated if needed.   No falls in last 12 months.  GAD: well controlled on sertraline 50 mcg daily Flowsheet Row Office Visit from 05/02/2022 in [facility] Total Score 2      Reviewed office visit from  Dr. Antoine Poche He is currently following CAD status post CABG , atrial fibrillation and cardiomyopathy.   S/P post bioprosthetic aortic valve replacement followed by Dr. Excell Seltzer CVTS  Followed by Hematology for iron deficiency anemia History of HIT  Hypertension:  Well controlled on current regimen BP Readings from Last 3 Encounters:  05/02/22 (!) 110/58  04/26/22 (!) 140/73  04/20/22 124/68  Using medication without problems or lightheadedness:  none Chest pain with exertion:none Edema:none Short of breath:none Average home BPs: good Other issues:  Prediabetes: Lab Results  Component Value Date   HGBA1C 4.8 03/19/2022   History of anemia, iron deficiency: Followed by Dr. Ave Filter  Elevated Cholesterol: LDL at goal less than 70 on atorvastatin 80 mg daily Lab Results  Component Value Date   CHOL 110 04/25/2022   HDL 50.00 04/25/2022   LDLCALC 44 04/25/2022   LDLDIRECT 151.0 01/05/2016   TRIG 82.0 04/25/2022   CHOLHDL 2 04/25/2022  Using medications without problems: none Muscle aches:  none Diet compliance: moderate Exercise: minimal given knee issues Other complaints:   Gout no recent flares on allopurinol 100 mg daily.  Uses colchicine as needed Lab Results  Component Value Date   LABURIC 4.6 05/27/2020   Planned left knee arthroplasty had been  held by low iron and anemia snd now by skin lesion on left leg.   Slight increase in  AST.[name] on ETOH, 6 tylenol and statin    Patient Care Team: Excell Seltzer, MD as PCP - General (Family Medicine) Rollene Rotunda, MD as PCP - Cardiology (Cardiology) Lanier Prude, MD as PCP - Electrophysiology (Cardiology) Jodelle Gross, NP as Nurse Practitioner (Cardiology) Kathyrn Sheriff, [facility] as Pharmacist (Pharmacist)   Relevant past medical, surgical, family and social history reviewed and updated as indicated. Interim  medical history since our last visit reviewed. Allergies and medications reviewed and updated. Outpatient Medications Prior to Visit  Medication Sig Dispense Refill   acetaminophen (TYLENOL) 500 MG tablet Take 1,000 mg by mouth every 6 (six) hours as needed for moderate pain.     allopurinol (ZYLOPRIM) 100 MG tablet Take 1 tablet by mouth once daily 90 tablet 1   ALPRAZolam (XANAX) 0.5 MG tablet TAKE 1 TABLET BY MOUTH ONCE DAILY AS  NEEDED  FOR  ANXIETY  TAKE  30  MINUTES  PRIOR  TO  PLANE FLIGHT OR AS NEEDED FOR ANXIETY 20 tablet 0   aspirin EC 81 MG tablet Take 1 tablet (81 mg total) by mouth daily. Swallow whole. 30 tablet 11   atorvastatin (LIPITOR) 80 MG tablet Take 1 tablet by mouth once daily 90 tablet 3   colchicine 0.6 MG tablet Take 2 tablets by mouth once daily 60 tablet 2   furosemide (LASIX) 20 MG tablet TAKE TWO TABLETS BY MOUTH IN THE MORNING AND TAKE ONE TABLET BY MOUTH IN THE AFTERNOON 270 tablet 3   JATENZO 237 MG CAPS Take 237 mg by mouth 2 (two) times daily.     ketoconazole (NIZORAL) 2 % shampoo Apply 1 application topically 2 (two) times a week. 120 mL 1   latanoprost (XALATAN) 0.005 % ophthalmic solution Place 1 drop into both eyes every morning.      metoprolol succinate (TOPROL-XL) 25 MG 24 hr tablet Take 1 tablet (25 mg total) by mouth 2 (two) times daily. 180 tablet 3   Misc Natural Products (OSTEO BI-FLEX/5-LOXIN ADVANCED) TABS Take 2 tablets by mouth daily.     potassium chloride SA (KLOR-CON M20) 20 MEQ tablet Take 1 tablet by mouth once daily 90 tablet 3   sacubitril-valsartan (ENTRESTO) 97-103 MG Take 1 tablet by mouth 2 (two) times daily. 60 tablet 11   sertraline (ZOLOFT) 50 MG tablet Take 1 tablet by mouth once daily 90 tablet 0   spironolactone (ALDACTONE) 25 MG tablet Take 1 tablet by mouth once daily 90 tablet 2   traMADol (ULTRAM) 50 MG tablet TAKE 1 TABLET BY MOUTH EVERY 8 HOURS AS NEEDED FOR PAIN 90 tablet 0   traZODone (DESYREL) 50 MG tablet Take 0.5-1  tablets (25-50 mg total) by mouth at bedtime as needed for sleep. 90 tablet 1   No facility-administered medications prior to visit.     Per HPI unless specifically indicated in ROS section below Review of Systems  Constitutional:  Negative for fatigue and fever.  HENT:  Negative for ear pain.   Eyes:  Negative for pain.  Respiratory:  Negative for cough and shortness of breath.   Cardiovascular:  Negative for chest pain, palpitations and leg swelling.  Gastrointestinal:  Negative for abdominal pain.  Genitourinary:  Negative for dysuria.  Musculoskeletal:  Negative for arthralgias.  Neurological:  Negative for syncope, light-headedness and headaches.  Psychiatric/Behavioral:  Negative for dysphoric mood.  Objective:  BP (!) 110/58 (BP Location: Left Arm, Patient Position: Sitting, Cuff Size: Normal)   Pulse 82   Temp 98 F (36.7 C) (Temporal)   Ht 5\' 6"  (1.676 m)   Wt 231 lb (104.8 kg)   SpO2 92%   BMI 37.28 kg/m   Wt Readings from Last 3 Encounters:  05/02/22 231 lb (104.8 kg)  04/20/22 234 lb 12 oz (106.5 kg)  04/13/22 233 lb 8 oz (105.9 kg)      Physical Exam Constitutional:      General: He is not in acute distress.    Appearance: Normal appearance. He is well-developed. He is not ill-appearing or toxic-appearing.  HENT:     Head: Normocephalic and atraumatic.     Right Ear: Hearing, tympanic membrane, ear canal and external ear normal.     Left Ear: Hearing, tympanic membrane, ear canal and external ear normal.     Nose: Nose normal.     Mouth/Throat:     Pharynx: Uvula midline.  Eyes:     General: Lids are normal. Lids are everted, no foreign bodies appreciated.     Conjunctiva/sclera: Conjunctivae normal.     Pupils: Pupils are equal, round, and reactive to light.  Neck:     Thyroid: No thyroid mass or thyromegaly.     Vascular: No carotid bruit.     Trachea: Trachea and phonation normal.  Cardiovascular:     Rate and Rhythm: Normal rate and regular  rhythm.     Pulses: Normal pulses.     Heart sounds: S1 normal and S2 normal. No murmur heard.    No gallop.  Pulmonary:     Breath sounds: Normal breath sounds. No wheezing, rhonchi or rales.  Abdominal:     General: Bowel sounds are normal.     Palpations: Abdomen is soft.     Tenderness: There is no abdominal tenderness. There is no guarding or rebound.     Hernia: No hernia is present.  Musculoskeletal:     Cervical back: Normal range of motion and neck supple.  Lymphadenopathy:     Cervical: No cervical adenopathy.  Skin:    General: Skin is warm and dry.     Findings: No rash.  Neurological:     Mental Status: He is alert.     Cranial Nerves: No cranial nerve deficit.     Sensory: No sensory deficit.     Gait: Gait normal.     Deep Tendon Reflexes: Reflexes are normal and symmetric.  Psychiatric:        Speech: Speech normal.        Behavior: Behavior normal.        Judgment: Judgment normal.       Results for orders placed or performed in visit on 04/30/22  CBC with Differential (Cancer Center Only)  Result Value Ref Range   WBC Count 4.9 4.0 - 10.5 K/uL   RBC 3.47 (L) 4.22 - 5.81 MIL/uL   Hemoglobin 11.0 (L) 13.0 - 17.0 g/dL   HCT 16.1 (L) 09.6 - 04.5 %   MCV 101.2 (H) 80.0 - 100.0 fL   MCH 31.7 26.0 - 34.0 pg   MCHC 31.3 30.0 - 36.0 g/dL   RDW 40.9 (H) 81.1 - 91.4 %   Platelet Count 231 150 - 400 K/uL   nRBC 0.0 0.0 - 0.2 %   Neutrophils Relative % 60 %   Neutro Abs 2.9 1.7 - 7.7 K/uL   Lymphocytes Relative 29 %  Lymphs Abs 1.4 0.7 - 4.0 K/uL   Monocytes Relative 9 %   Monocytes Absolute 0.4 0.1 - 1.0 K/uL   Eosinophils Relative 2 %   Eosinophils Absolute 0.1 0.0 - 0.5 K/uL   Basophils Relative 0 %   Basophils Absolute 0.0 0.0 - 0.1 K/uL   Immature Granulocytes 0 %   Abs Immature Granulocytes 0.01 0.00 - 0.07 K/uL  Sample to Blood Bank  Result Value Ref Range   Blood Bank Specimen SAMPLE AVAILABLE FOR TESTING    Sample Expiration       05/03/2022,2359 Performed at [facility], [address] [place], [place] [address]     This visit occurred during the SARS-CoV-2 public health emergency.  Safety protocols were in place, including screening questions prior to the visit, additional usage of staff PPE, and extensive cleaning of exam room while observing appropriate contact time as indicated for disinfecting solutions.   COVID 19 screen:  No recent travel or known exposure to COVID19 The patient denies respiratory symptoms of COVID 19 at this time. The importance of social distancing was discussed today.   Assessment and Plan   The patient's preventative maintenance and recommended screening tests for an annual wellness exam were reviewed in full today. Brought up to date unless services declined.  Counselled on the importance of diet, exercise, and its role in overall health and mortality. The patient's FH and SH was reviewed, including their home life, tobacco status, and drug and alcohol status.   Vaccines: given flu.. uptodate with PNA, Tdap due.. Consider shingles S/P COVID x 3 Prostate Cancer Screen:   Not indicated given age. Colon Cancer Screen:  He is not interested in any screening typing.      Smoking Status:non smoker ETOH/ drug use:  1-2 per day, drinking more nonalcoholic beer though/ none  Hep C: neg  HIV screen:   refused  Problem List Items Addressed This Visit     Atrial fibrillation (HCC)    Chronic, stable  On anticoagulant followed by cardiology       Chronic gout of multiple sites    Chronic, no recent flares.  Continue allopurinol 100 mg p.o. daily use colchicine as needed for flare      Dilated cardiomyopathy (HCC)    Asymptomatic today.  Last ECHO: 12/23 ejection fraction 60 to 65%      Dyslipidemia    Chronic, well controlled  LDL at goal less than 70 on atorvastatin 80 mg daily      Essential hypertension (Chronic)    Stable, chronic.  Continue  current medication.   Furosemide 20 mg 2 tablets in the morning and 1 tablet in the afternoon Metoprolol XL 25 mg 2 tablet daily Entresto will 97/103 mg twice daily Spironolactone 25 mg p.o. daily      GAD (generalized anxiety disorder)    Stable, chronic.  Continue current medication.    sertraline 50 mg daily      Prediabetes (Chronic)    Resolved.      Other Visit Diagnoses     Routine general medical examination at a health care facility    -  Primary     No orders of the defined types were placed in this encounter.    [name], MD

## 2022-05-02 NOTE — Assessment & Plan Note (Signed)
Asymptomatic today.  Last ECHO: 12/23 ejection fraction 60 to 65%

## 2022-05-02 NOTE — Assessment & Plan Note (Signed)
Stable, chronic.  Continue current medication.   Furosemide 20 mg 2 tablets in the morning and 1 tablet in the afternoon Metoprolol XL 25 mg 2 tablet daily Entresto will 97/103 mg twice daily Spironolactone 25 mg p.o. daily

## 2022-05-02 NOTE — Progress Notes (Signed)
Gregory, [name] ([id number]) [id number] Page 1 of 7 Visit Report for 05/01/2022 Chief Complaint Document Details Patient Name: Date of Service: Gregory [name], [place] RD J. 05/01/2022 7:30 A M Medical Record Number: [id number] Patient Account Number: [id number] Date of Birth/Sex: Treating RN: [birth year] (72 y.o. M) Primary Care Provider: Kerby [name] Other Clinician: Referring Provider: Treating Provider/Extender: [name], [name] in Treatment: 3 Information Obtained from: Patient Chief Complaint Patient seen for complaints of Non-Healing Wound. Electronic Signature(s) Signed: 05/01/2022 7:58:53 AM By: Duanne Guess MD FACS Entered By: Duanne Guess on 05/01/2022 07:58:53 -------------------------------------------------------------------------------- HPI Details Patient Name: Date of Service: Gregory [name], [name] 05/01/2022 7:30 A M Medical Record Number: [id number] Patient Account Number: [id number] Date of Birth/Sex: Treating RN: [birth year] (72 y.o. M) Primary Care Provider: Kerby [name] Other Clinician: Referring Provider: Treating Provider/Extender: [name], [name] in Treatment: 3 History of Present Illness HPI Description: ADMISSION 04/10/2022 This is a 72 year old man with a past medical history significant for hypertension, severe aortic insufficiency status post aortic valvuloplasty, grade 1 diastolic dysfunction, peripheral vascular disease, permanent pacemaker, lymphedema, and coronary artery disease. He dropped a cinder block which struck his leg at the beginning of March 2024. He sought care in the ED on March 4. Plain films did not demonstrate any fracture or foreign body. Steri-Strips were applied. Apparently the patient was soaking the wound with hydrogen peroxide daily. He saw his PCP who gave him an IM injection of ceftriaxone and prescribed doxycycline. He was advised to elevate his legs and clean  with just warm soapy water and apply antibiotic ointment. Formal ABIs were done in February 2024 that showed noncompressible ABIs bilaterally and a TBI of 0.6 on the right and 0.59 on the left. There is a triangular wound on his left lower leg, just lateral to the anterior tibial surface. There is slough and nonviable fat present. No concern for infection. 04/16/2022: The wound is about the same size, but much cleaner this week. There is a little bit of slough on the surface. There is hypertrophic granulation tissue present. Edema control is good. 04/24/2022: His wound is smaller and cleaner today. Edema control is good. Minimal slough accumulation. Healthy-looking granulation tissue present. 05/01/2022: The wound is smaller by half a centimeter. It is flush with the surrounding skin and there has been no accumulation of slough. Edema control is excellent. Electronic Signature(s) Signed: 05/01/2022 7:59:23 AM By: Duanne Guess MD FACS Entered By: Duanne Guess on 05/01/2022 07:59:22 -------------------------------------------------------------------------------- Physical Exam Details Patient Name: Date of Service: Gregory [name], [name] 05/01/2022 7:30 A M Medical Record Number: [id number] Patient Account Number: [id number] Date of Birth/Sex: Treating RN: 01-20-1950 (72 y.o. M) Primary Care Provider: Kerby [name] Other Clinician: Referring Provider: Treating Provider/Extender: [name], [name] in Treatment: [address] [place], Gregory [name] ([id number]) [id number] Page 2 of 7 . . . . no acute distress. Respiratory Normal work of breathing on room air. Notes 05/01/2022: The wound is smaller by half a centimeter. It is flush with the surrounding skin and there has been no accumulation of slough. Edema control is excellent. Electronic Signature(s) Signed: 05/01/2022 7:59:48 AM By: Duanne Guess MD FACS Entered By: Duanne Guess on  05/01/2022 07:59:48 -------------------------------------------------------------------------------- Physician Orders Details Patient Name: Date of Service: Gregory [name], [name] 05/01/2022 7:30 A M Medical Record Number: [id number] Patient Account Number: [id number] Date of Birth/Sex: Treating RN: July 18, 1950 (72 y.o. [name] Primary Care Provider: Kerby [name] Other Clinician: Referring Provider: Treating  Provider/Extender: [name], [name] in Treatment: 3 Verbal / Phone Orders: No Diagnosis Coding ICD-10 Coding Code Description [phone] Non-pressure chronic ulcer of other part of right lower leg with fat layer exposed I87.2 Venous insufficiency (chronic) (peripheral) I73.9 Peripheral vascular disease, unspecified I42.0 Dilated cardiomyopathy I35.1 Nonrheumatic aortic (valve) insufficiency I50.30 Unspecified diastolic (congestive) heart failure Follow-up Appointments ppointment in 1 week. - Dr. Lady Gary RM 1 Return A Tuesday 5/7 @ 0730 am Anesthetic Wound #1 Right,Lateral Lower Leg (In clinic) Topical Lidocaine 4% applied to wound bed Bathing/ Shower/ Hygiene May shower with protection but do not get wound dressing(s) wet. Protect dressing(s) with water repellant cover (for example, large plastic bag) or a cast cover and may then take shower. - may be purchased at [facility] or [facility] Edema Control - Lymphedema / SCD / Other Elevate legs to the level of the heart or above for 30 minutes daily and/or when sitting for 3-4 times a day throughout the day. - throughout the day Avoid standing for long periods of time. Exercise regularly Compression stocking or Garment 20-30 mm/Hg pressure to: - left leg daily Wound Treatment Wound #1 - Lower Leg Wound Laterality: Right, Lateral Peri-Wound Care: Sween Lotion (Moisturizing lotion) 1 x Per Week/30 Days Discharge Instructions: Apply moisturizing lotion as directed Prim Dressing: Maxorb Extra Ag+ Alginate  Dressing, 2x2 (in/in) 1 x Per Week/30 Days ary Discharge Instructions: Apply to wound bed as instructed Secondary Dressing: Woven Gauze Sponge, Non-Sterile 4x4 in 1 x Per Week/30 Days Discharge Instructions: Apply over primary dressing as directed. Compression Wrap: Urgo K2 Lite, two layer compression system, regular 1 x Per Week/30 Days Discharge Instructions: Apply Urgo K2 Lite as directed (alternative to 3 layer compression). Electronic Signature(s) Signed: 05/01/2022 8:01:38 AM By: Duanne Guess MD FACS MISHAEL, HARAN ([id number]) By: Duanne Guess MD FACS [phone].pdf Page 3 of 7 Signed: 05/01/2022 8:01:38 AM Entered By: Duanne Guess on 05/01/2022 08:00:20 -------------------------------------------------------------------------------- Problem List Details Patient Name: Date of Service: Gregory [name], [place] RD J. 05/01/2022 7:30 A M Medical Record Number: [id number] Patient Account Number: [id number] Date of Birth/Sex: Treating RN: 05-20-1950 (72 y.o. [name] Primary Care Provider: Kerby [name] Other Clinician: Referring Provider: Treating Provider/Extender: [name], [name] in Treatment: 3 Active Problems ICD-10 Encounter Code Description Active Date MDM Diagnosis [phone] Non-pressure chronic ulcer of other part of right lower leg with fat layer 04/10/2022 No Yes exposed I87.2 Venous insufficiency (chronic) (peripheral) 04/10/2022 No Yes I73.9 Peripheral vascular disease, unspecified 04/10/2022 No Yes I42.0 Dilated cardiomyopathy 04/10/2022 No Yes I35.1 Nonrheumatic aortic (valve) insufficiency 04/10/2022 No Yes I50.30 Unspecified diastolic (congestive) heart failure 04/10/2022 No Yes Inactive Problems Resolved Problems Electronic Signature(s) Signed: 05/01/2022 7:58:40 AM By: Duanne Guess MD FACS Entered By: Duanne Guess on 05/01/2022  07:58:40 -------------------------------------------------------------------------------- Progress Note Details Patient Name: Date of Service: Gregory [name], [name] 05/01/2022 7:30 A M Medical Record Number: [id number] Patient Account Number: [id number] Date of Birth/Sex: Treating RN: 1950/07/14 (72 y.o. M) Primary Care Provider: Kerby [name] Other Clinician: Referring Provider: Treating Provider/Extender: [name], [name] in Treatment: 3 Subjective Chief Complaint Information obtained from Patient Patient seen for complaints of Non-Healing Wound. History of Present Illness (HPI) ADMISSION JUVENTINO, PAVONE ([id number]) [id number] Page 4 of 7 04/10/2022 This is a 72 year old man with a past medical history significant for hypertension, severe aortic insufficiency status post aortic valvuloplasty, grade 1 diastolic dysfunction, peripheral vascular disease, permanent pacemaker, lymphedema, and coronary artery disease. He dropped a cinder block which  struck his leg at the beginning of March 2024. He sought care in the ED on March 4. Plain films did not demonstrate any fracture or foreign body. Steri-Strips were applied. Apparently the patient was soaking the wound with hydrogen peroxide daily. He saw his PCP who gave him an IM injection of ceftriaxone and prescribed doxycycline. He was advised to elevate his legs and clean with just warm soapy water and apply antibiotic ointment. Formal ABIs were done in February 2024 that showed noncompressible ABIs bilaterally and a TBI of 0.6 on the right and 0.59 on the left. There is a triangular wound on his left lower leg, just lateral to the anterior tibial surface. There is slough and nonviable fat present. No concern for infection. 04/16/2022: The wound is about the same size, but much cleaner this week. There is a little bit of slough on the surface. There is hypertrophic granulation tissue present. Edema  control is good. 04/24/2022: His wound is smaller and cleaner today. Edema control is good. Minimal slough accumulation. Healthy-looking granulation tissue present. 05/01/2022: The wound is smaller by half a centimeter. It is flush with the surrounding skin and there has been no accumulation of slough. Edema control is excellent. Patient History Information obtained from Patient, Chart. Family History Unknown History. Social History Never smoker, Marital Status - Married, Alcohol Use - Moderate, Drug Use - Current History - occaisional TCH, Caffeine Use - Moderate. Medical History Eyes Patient has history of Cataracts - bil extractions Ear/Nose/Mouth/Throat Denies history of Chronic sinus problems/congestion, Middle ear problems Hematologic/Lymphatic Patient has history of Anemia - macrocytic Cardiovascular Patient has history of Arrhythmia - afib, complete heart block, Coronary Artery Disease, Hypertension, Myocardial Infarction, Peripheral Arterial Disease Endocrine Denies history of Type I Diabetes, Type II Diabetes Genitourinary Denies history of End Stage Renal Disease Integumentary (Skin) Denies history of History of Burn Musculoskeletal Patient has history of Gout, Osteoarthritis Denies history of Rheumatoid Arthritis, Osteomyelitis Oncologic Denies history of Received Chemotherapy, Received Radiation Psychiatric Patient has history of Confinement Anxiety Denies history of Anorexia/bulimia Hospitalization/Surgery History - pacemaker insertion 10/2020. - balloon aortic valve valvuloplasty. - CABG 03/2020. - aortic valve replacement. - cataract extraction. Medical A Surgical History Notes nd Cardiovascular aortic insufficiency, cardiomyopathy, pacemaker Objective Constitutional no acute distress. Vitals Time Taken: 7:42 AM, Height: 66 in, Weight: 225 lbs, BMI: 36.3, Temperature: 98.1 F, Pulse: 73 bpm, Respiratory Rate: 18 breaths/min, Blood Pressure: 128/72  mmHg. Respiratory Normal work of breathing on room air. General Notes: 05/01/2022: The wound is smaller by half a centimeter. It is flush with the surrounding skin and there has been no accumulation of slough. Edema control is excellent. Integumentary (Hair, Skin) Wound #1 status is Open. Original cause of wound was Trauma. The date acquired was: 03/05/2022. The wound has been in treatment 3 weeks. The wound is located on the Right,Lateral Lower Leg. The wound measures 1.2cm length x 1cm width x 0.1cm depth; 0.942cm^2 area and 0.094cm^3 volume. There is Fat Layer (Subcutaneous Tissue) exposed. There is no tunneling or undermining noted. There is a small amount of serosanguineous drainage noted. The wound margin is flat and intact. There is medium (34-66%) red granulation within the wound bed. There is a medium (34-66%) amount of necrotic tissue within the wound bed including Adherent Slough. The periwound skin appearance had no abnormalities noted for texture. The periwound skin appearance had no QUINCE, SANTANA ([id number]) [id number] Page 5 of 7 abnormalities noted for moisture. The periwound skin appearance exhibited: Hemosiderin Staining. Periwound temperature  was noted as No Abnormality. Assessment Active Problems ICD-10 Non-pressure chronic ulcer of other part of right lower leg with fat layer exposed Venous insufficiency (chronic) (peripheral) Peripheral vascular disease, unspecified Dilated cardiomyopathy Nonrheumatic aortic (valve) insufficiency Unspecified diastolic (congestive) heart failure Procedures Wound #1 Pre-procedure diagnosis of Wound #1 is a Venous Leg Ulcer located on the Right,Lateral Lower Leg . There was a Double Layer Compression Therapy Procedure by [name], RN. Post procedure Diagnosis Wound #1: Same as Pre-Procedure Notes: urgo lite. Plan Follow-up Appointments: Return Appointment in 1 week. - Dr. Lady Gary RM 1 Tuesday 5/7 @  0730 am Anesthetic: Wound #1 Right,Lateral Lower Leg: (In clinic) Topical Lidocaine 4% applied to wound bed Bathing/ Shower/ Hygiene: May shower with protection but do not get wound dressing(s) wet. Protect dressing(s) with water repellant cover (for example, large plastic bag) or a cast cover and may then take shower. - may be purchased at [facility] or [facility] Edema Control - Lymphedema / SCD / Other: Elevate legs to the level of the heart or above for 30 minutes daily and/or when sitting for 3-4 times a day throughout the day. - throughout the day Avoid standing for long periods of time. Exercise regularly Compression stocking or Garment 20-30 mm/Hg pressure to: - left leg daily WOUND #1: - Lower Leg Wound Laterality: Right, Lateral Peri-Wound Care: Sween Lotion (Moisturizing lotion) 1 x Per Week/30 Days Discharge Instructions: Apply moisturizing lotion as directed Prim Dressing: Maxorb Extra Ag+ Alginate Dressing, 2x2 (in/in) 1 x Per Week/30 Days ary Discharge Instructions: Apply to wound bed as instructed Secondary Dressing: Woven Gauze Sponge, Non-Sterile 4x4 in 1 x Per Week/30 Days Discharge Instructions: Apply over primary dressing as directed. Com pression Wrap: Urgo K2 Lite, two layer compression system, regular 1 x Per Week/30 Days Discharge Instructions: Apply Urgo K2 Lite as directed (alternative to 3 layer compression). 05/01/2022: The wound is smaller by half a centimeter. It is flush with the surrounding skin and there has been no accumulation of slough. Edema control is excellent. No debridement was necessary today. We will continue silver alginate with 3 layer compression equivalent. Follow-up in 1 week. Electronic Signature(s) Signed: 05/01/2022 8:00:43 AM By: Duanne Guess MD FACS Entered By: Duanne Guess on 05/01/2022 08:00:43 -------------------------------------------------------------------------------- HxROS Details Patient Name: Date of Service: Gregory [name],  [name] 05/01/2022 7:30 A M Medical Record Number: [id number] Patient Account Number: [id number] Date of Birth/Sex: Treating RN: 1950/04/27 (72 y.o. M) Primary Care Provider: Kerby [name] Other Clinician: Referring Provider: Treating Provider/Extender: Devone, Tousley ([id number]) [id number] Page 6 of 7 Weeks in Treatment: 3 Information Obtained From Patient Chart Eyes Medical History: Positive for: Cataracts - bil extractions Ear/Nose/Mouth/Throat Medical History: Negative for: Chronic sinus problems/congestion; Middle ear problems Hematologic/Lymphatic Medical History: Positive for: Anemia - macrocytic Cardiovascular Medical History: Positive for: Arrhythmia - afib, complete heart block; Coronary Artery Disease; Hypertension; Myocardial Infarction; Peripheral Arterial Disease Past Medical History Notes: aortic insufficiency, cardiomyopathy, pacemaker Endocrine Medical History: Negative for: Type I Diabetes; Type II Diabetes Genitourinary Medical History: Negative for: End Stage Renal Disease Integumentary (Skin) Medical History: Negative for: History of Burn Musculoskeletal Medical History: Positive for: Gout; Osteoarthritis Negative for: Rheumatoid Arthritis; Osteomyelitis Oncologic Medical History: Negative for: Received Chemotherapy; Received Radiation Psychiatric Medical History: Positive for: Confinement Anxiety Negative for: Anorexia/bulimia HBO Extended History Items Eyes: Cataracts Immunizations Pneumococcal Vaccine: Received Pneumococcal Vaccination: Yes Received Pneumococcal Vaccination On or After 60th Birthday: Yes Implantable Devices Yes Hospitalization / Surgery History Type of Hospitalization/Surgery  pacemaker insertion 10/2020 balloon aortic valve valvuloplasty CABG 03/2020 BARNABY, RIPPEON ([id number]) [id number] Page 7 of 7 aortic valve  replacement cataract extraction Family and Social History Unknown History: Yes; Never smoker; Marital Status - Married; Alcohol Use: Moderate; Drug Use: Current History - occaisional TCH; Caffeine Use: Moderate; Financial Concerns: No; Food, Clothing or Shelter Needs: No; Support System Lacking: No; Transportation Concerns: No Electronic Signature(s) Signed: 05/01/2022 8:01:38 AM By: Duanne Guess MD FACS Entered By: Duanne Guess on 05/01/2022 07:59:28 -------------------------------------------------------------------------------- SuperBill Details Patient Name: Date of Service: Gregory [name], [name] 05/01/2022 Medical Record Number: [id number] Patient Account Number: [id number] Date of Birth/Sex: Treating RN: 11-25-1950 (72 y.o. M) Primary Care Provider: Kerby [name] Other Clinician: Referring Provider: Treating Provider/Extender: [name], [name] in Treatment: 3 Diagnosis Coding ICD-10 Codes Code Description [phone] Non-pressure chronic ulcer of other part of right lower leg with fat layer exposed I87.2 Venous insufficiency (chronic) (peripheral) I73.9 Peripheral vascular disease, unspecified I42.0 Dilated cardiomyopathy I35.1 Nonrheumatic aortic (valve) insufficiency I50.30 Unspecified diastolic (congestive) heart failure Facility Procedures : CPT4 Code: [id number] Description: (Facility Use Only) [phone] - APPLY MULTLAY COMPRS LWR RT LEG Modifier: Quantity: 1 Physician Procedures : CPT4 Code Description Modifier [id number] - WC PHYS LEVEL 3 - EST PT ICD-10 Diagnosis Description L97.812 Non-pressure chronic ulcer of other part of right lower leg with fat layer exposed I87.2 Venous insufficiency (chronic) (peripheral) I73.9  Peripheral vascular disease, unspecified I50.30 Unspecified diastolic (congestive) heart failure Quantity: 1 Electronic Signature(s) Signed: 05/01/2022 3:54:11 PM By: Duanne Guess MD FACS Signed: 05/01/2022 4:43:48 PM By:  [name] RN, BSN Previous Signature: 05/01/2022 8:01:15 AM Version By: Duanne Guess MD FACS Entered By: [name] on 05/01/2022 08:07:36

## 2022-05-02 NOTE — Assessment & Plan Note (Signed)
Chronic, well controlled  LDL at goal less than 70 on atorvastatin 80 mg daily 

## 2022-05-02 NOTE — Assessment & Plan Note (Signed)
Chronic, no recent flares.  Continue allopurinol 100 mg p.o. daily use colchicine as needed for flare 

## 2022-05-02 NOTE — Assessment & Plan Note (Signed)
Stable, chronic.  Continue current medication. ° ° ° sertraline 50 mg daily.  °

## 2022-05-02 NOTE — Assessment & Plan Note (Signed)
Chronic, stable  On anticoagulant followed by cardiology  

## 2022-05-02 NOTE — Progress Notes (Signed)
Gregory [name], Gregory [name] ([id number]) [id number] Page 1 of 7 Visit Report for 05/01/2022 Arrival Information Details Patient Name: Date of Service: Gregory [name], [place] RD [name] 05/01/2022 7:30 A M Medical Record Number: [id number] Patient Account Number: [id number] Date of Birth/Sex: Treating RN: [birth year] (72 y.o. Gregory [name] Primary Care Jeanni Allshouse: [name] Other Clinician: Referring Shahzain Kiester: Treating Basma Buchner/Extender: [name], [name] in Treatment: 3 Visit Information History Since Last Visit All ordered tests and consults were completed: Yes Patient Arrived: Cane Added or deleted any medications: No Arrival Time: 07:37 Any new allergies or adverse reactions: No Accompanied By: self Had a fall or experienced change in No Transfer Assistance: None activities of daily living that may affect Patient Identification Verified: Yes risk of falls: Secondary Verification Process Completed: Yes Signs or symptoms of abuse/neglect since last visito No Patient Requires Transmission-Based Precautions: No Hospitalized since last visit: No Patient Has Alerts: Yes Implantable device outside of the clinic excluding No Patient Alerts: R ABI N/C cellular tissue based products placed in the center since last visit: Has Dressing in Place as Prescribed: Yes Has Compression in Place as Prescribed: Yes Pain Present Now: No Electronic Signature(s) Signed: 05/01/2022 4:43:48 PM By: [name] RN, BSN Entered By: [name] on 05/01/2022 07:40:09 -------------------------------------------------------------------------------- Compression Therapy Details Patient Name: Date of Service: Gregory [name], ED[place] RD [name] 05/01/2022 7:30 A M Medical Record Number: [id number] Patient Account Number: [id number] Date of Birth/Sex: Treating RN: [birth year] (72 y.o. Gregory [name] Primary Care Kyren Knick: [name] Other Clinician: Referring Michelangelo Rindfleisch: Treating  Peace Jost/Extender: [name], [name] in Treatment: 3 Compression Therapy Performed for Wound Assessment: Wound #1 Right,Lateral Lower Leg Performed By: Clinician [name], RN Compression Type: Double Layer Post Procedure Diagnosis Same as Pre-procedure Notes urgo lite Electronic Signature(s) Signed: 05/01/2022 4:43:48 PM By: [name] RN, BSN Entered By: [name] on 05/01/2022 07:55:48 -------------------------------------------------------------------------------- Encounter Discharge Information Details Patient Name: Date of Service: Gregory [name], EDWA RD [name] 05/01/2022 7:30 A M Medical Record Number: [id number] Patient Account Number: [id number] Date of Birth/Sex: Treating RN: 1950/01/18 (72 y.o. Gregory [name] Primary Care Delani Kohli: [name] Other Clinician: Referring Chloey Ricard: Treating Rylei Masella/Extender: [name], [name] in Treatment: 3 Encounter Discharge Information Items Gregory [name], Gregory [name] ([id number]) [id number] Page 2 of 7 Discharge Condition: Stable Ambulatory Status: Cane Discharge Destination: Home Transportation: Private Auto Accompanied By: self Schedule Follow-up Appointment: Yes Clinical Summary of Care: Patient Declined Electronic Signature(s) Signed: 05/01/2022 4:43:48 PM By: [name] RN, BSN Entered By: [name] on 05/01/2022 08:09:06 -------------------------------------------------------------------------------- Lower Extremity Assessment Details Patient Name: Date of Service: Gregory [name], EDWA RD [name] 05/01/2022 7:30 A M Medical Record Number: [id number] Patient Account Number: [id number] Date of Birth/Sex: Treating RN: [birth year] (72 y.o. Gregory [name] Primary Care Kass Herberger: [name] Other Clinician: Referring Kowen Kluth: Treating Jayma Volpi/Extender: [name], [name] in Treatment: 3 Edema Assessment Assessed: [Left: No] [Right:  No] Edema: [Left: Ye] [Right: s] Calf Left: Right: Point of Measurement: From Medial Instep 34.5 cm Ankle Left: Right: Point of Measurement: From Medial Instep 23 cm Vascular Assessment Pulses: Dorsalis Pedis Palpable: [Right:Yes] Electronic Signature(s) Signed: 05/01/2022 4:43:48 PM By: [name] RN, BSN Entered By: [name] on 05/01/2022 07:45:44 -------------------------------------------------------------------------------- Multi Wound Chart Details Patient Name: Date of Service: Gregory [name], EDWA RD [name] 05/01/2022 7:30 A M Medical Record Number: [id number] Patient Account Number: [id number] Date of Birth/Sex: Treating RN: [birth year] (72 y.o. M) Primary Care Jarquez Mestre: [name] Other Clinician: Referring Lakeisa Heninger: Treating Worthy Boschert/Extender: Lady Gary,  Enid Derry, [name] in Treatment: 3 Vital Signs Height(in): 66 Pulse(bpm): 73 Weight(lbs): 225 Blood Pressure(mmHg): 128/72 Body Mass Index(BMI): 36.3 Temperature(F): 98.1 Respiratory Rate(breaths/min): 18 [1:Photos:] [N/A:N/A] Right, Lateral Lower Leg N/A N/A Wound Location: Trauma N/A N/A Wounding Event: Venous Leg Ulcer N/A N/A Primary Etiology: Cataracts, Anemia, Arrhythmia, N/A N/A Comorbid History: Coronary Artery Disease, Hypertension, Myocardial Infarction, Peripheral Arterial Disease, Gout, Osteoarthritis, Confinement Anxiety 03/05/2022 N/A N/A Date Acquired: 3 N/A N/A Weeks of Treatment: Open N/A N/A Wound Status: No N/A N/A Wound Recurrence: 1.2x1x0.1 N/A N/A Measurements L x W x D (cm) 0.942 N/A N/A A (cm) : rea 0.094 N/A N/A Volume (cm) : 62.90% N/A N/A % Reduction in Area: 63.00% N/A N/A % Reduction in Volume: Full Thickness Without Exposed N/A N/A Classification: Support Structures Small N/A N/A Exudate Amount: Serosanguineous N/A N/A Exudate Type: red, brown N/A N/A Exudate Color: Flat and Intact N/A N/A Wound Margin: Medium (34-66%) N/A N/A Granulation  Amount: Red N/A N/A Granulation Quality: Medium (34-66%) N/A N/A Necrotic Amount: Fat Layer (Subcutaneous Tissue): Yes N/A N/A Exposed Structures: Fascia: No Tendon: No Muscle: No Joint: No Bone: No Small (1-33%) N/A N/A Epithelialization: No Abnormalities Noted N/A N/A Periwound Skin Texture: No Abnormalities Noted N/A N/A Periwound Skin Moisture: Hemosiderin Staining: Yes N/A N/A Periwound Skin Color: No Abnormality N/A N/A Temperature: Compression Therapy N/A N/A Procedures Performed: Treatment Notes Electronic Signature(s) Signed: 05/01/2022 7:58:48 AM By: Duanne Guess MD FACS Entered By: Duanne Guess on 05/01/2022 07:58:47 -------------------------------------------------------------------------------- Multi-Disciplinary Care Plan Details Patient Name: Date of Service: Gregory [name], EDWA RD [name] 05/01/2022 7:30 A M Medical Record Number: [id number] Patient Account Number: [id number] Date of Birth/Sex: Treating RN: 03-Apr-1950 (72 y.o. Gregory [name] Primary Care Lindi Abram: [name] Other Clinician: Referring Ozias Dicenzo: Treating Akili Corsetti/Extender: [name], [name] in Treatment: 3 Multidisciplinary Care Plan reviewed with physician Active Inactive Venous Leg Ulcer Nursing Diagnoses: Actual venous Insuffiency (use after diagnosis is confirmed) Knowledge deficit related to disease process and management Goals: Patient will maintain optimal edema control Date Initiated: 04/10/2022 Target Resolution Date: 05/08/2022 Goal Status: Active Gregory [name], Gregory [name] ([id number]) [id number] Page 4 of 7 Interventions: Assess peripheral edema status every visit. Compression as ordered Treatment Activities: Therapeutic compression applied : 04/10/2022 Notes: Wound/Skin Impairment Nursing Diagnoses: Impaired tissue integrity Knowledge deficit related to ulceration/compromised skin integrity Goals: Patient/caregiver will verbalize  understanding of skin care regimen Date Initiated: 04/10/2022 Target Resolution Date: 05/08/2022 Goal Status: Active Ulcer/skin breakdown will have a volume reduction of 30% by week 4 Date Initiated: 04/10/2022 Target Resolution Date: 05/08/2022 Goal Status: Active Interventions: Assess patient/caregiver ability to obtain necessary supplies Assess patient/caregiver ability to perform ulcer/skin care regimen upon admission and as needed Assess ulceration(s) every visit Provide education on ulcer and skin care Treatment Activities: Skin care regimen initiated : 04/10/2022 Topical wound management initiated : 04/10/2022 Notes: Electronic Signature(s) Signed: 05/01/2022 4:43:48 PM By: [name] RN, BSN Entered By: [name] on 05/01/2022 07:52:08 -------------------------------------------------------------------------------- Pain Assessment Details Patient Name: Date of Service: Gregory [name], EDWA RD [name] 05/01/2022 7:30 A M Medical Record Number: [id number] Patient Account Number: [id number] Date of Birth/Sex: Treating RN: March 08, 1950 (72 y.o. Gregory [name] Primary Care Jaliza Seifried: [name] Other Clinician: Referring Teja Costen: Treating Auther Lyerly/Extender: [name], [name] in Treatment: 3 Active Problems Location of Pain Severity and Description of Pain Patient Has Paino No Site Locations Rate the pain. Current Pain Level: 0 Pain Management and Medication Gregory [name], Gregory [name] ([id number]) [id number] Page 5 of 7 Current  Pain Management: Electronic Signature(s) Signed: 05/01/2022 4:43:48 PM By: [name] RN, BSN Entered By: [name] on 05/01/2022 07:40:19 -------------------------------------------------------------------------------- Patient/Caregiver Education Details Patient Name: Date of Service: Gregory [name], EDWA RD [name] 4/30/2024andnbsp7:30 A M Medical Record Number: [id number] Patient Account Number: [id number] Date of  Birth/Gender: Treating RN: Mar 28, 1950 (72 y.o. Gregory [name] Primary Care Physician: [name] Other Clinician: Referring Physician: Treating Physician/Extender: [name], [name] in Treatment: 3 Education Assessment Education Provided To: Patient Education Topics Provided Venous: Methods: Explain/Verbal Responses: Reinforcements needed, State content correctly Electronic Signature(s) Signed: 05/01/2022 4:43:48 PM By: [name] RN, BSN Entered By: [name] on 05/01/2022 07:52:22 -------------------------------------------------------------------------------- Wound Assessment Details Patient Name: Date of Service: Gregory [name], EDWA RD [name] 05/01/2022 7:30 A M Medical Record Number: [id number] Patient Account Number: [id number] Date of Birth/Sex: Treating RN: [birth year] (72 y.o. Gregory [name] Primary Care Zane Pellecchia: [name] Other Clinician: Referring Twana Wileman: Treating Zykee Avakian/Extender: [name], [name] in Treatment: 3 Wound Status Wound Number: 1 Primary Venous Leg Ulcer Etiology: Wound Location: Right, Lateral Lower Leg Wound Open Wounding Event: Trauma Status: Date Acquired: 03/05/2022 Comorbid Cataracts, Anemia, Arrhythmia, Coronary Artery Disease, Weeks Of Treatment: 3 History: Hypertension, Myocardial Infarction, Peripheral Arterial Disease, Clustered Wound: No Gout, Osteoarthritis, Confinement Anxiety Photos Wound Measurements Length: (cm) 1.2 Gregory [name], Gregory [name] ([id number]) Width: (cm) Depth: (cm) Area: (cm) Volume: (cm) % Reduction in Area: 62.9% [id number] Page 6 of 7 1 % Reduction in Volume: 63% 0.1 Epithelialization: Small (1-33%) 0.942 Tunneling: No 0.094 Undermining: No Wound Description Classification: Full Thickness Without Exposed Support Structures Wound Margin: Flat and Intact Exudate Amount: Small Exudate Type: Serosanguineous Exudate Color: red,  brown Foul Odor After Cleansing: No Slough/Fibrino Yes Wound Bed Granulation Amount: Medium (34-66%) Exposed Structure Granulation Quality: Red Fascia Exposed: No Necrotic Amount: Medium (34-66%) Fat Layer (Subcutaneous Tissue) Exposed: Yes Necrotic Quality: Adherent Slough Tendon Exposed: No Muscle Exposed: No Joint Exposed: No Bone Exposed: No Periwound Skin Texture Texture Color No Abnormalities Noted: Yes No Abnormalities Noted: No Hemosiderin Staining: Yes Moisture No Abnormalities Noted: Yes Temperature / Pain Temperature: No Abnormality Treatment Notes Wound #1 (Lower Leg) Wound Laterality: Right, Lateral Cleanser Peri-Wound Care Sween Lotion (Moisturizing lotion) Discharge Instruction: Apply moisturizing lotion as directed Topical Primary Dressing Maxorb Extra Ag+ Alginate Dressing, 2x2 (in/in) Discharge Instruction: Apply to wound bed as instructed Secondary Dressing Woven Gauze Sponge, Non-Sterile 4x4 in Discharge Instruction: Apply over primary dressing as directed. Secured With Compression Wrap Urgo K2 Lite, two layer compression system, regular Discharge Instruction: Apply Urgo K2 Lite as directed (alternative to 3 layer compression). Compression Stockings Add-Ons Electronic Signature(s) Signed: 05/01/2022 4:43:48 PM By: [name] RN, BSN Entered By: [name] on 05/01/2022 07:50:17 -------------------------------------------------------------------------------- Vitals Details Patient Name: Date of Service: Gregory [name], EDWA RD [name] 05/01/2022 7:30 A M Medical Record Number: [id number] Patient Account Number: [id number] Date of Birth/Sex: Treating RN: Jul 01, 1950 (72 y.o. Gregory [name] Primary Care Fionna Merriott: [name] Other Clinician: Referring Lisaann Atha: Treating Kloey Cazarez/Extender: [name], [name] in Treatment: 3 Vital Signs Gregory [name], Gregory [name] ([id number]) [id number] Page 7 of 7 Time Taken:  07:42 Temperature (F): 98.1 Height (in): 66 Pulse (bpm): 73 Weight (lbs): 225 Respiratory Rate (breaths/min): 18 Body Mass Index (BMI): 36.3 Blood Pressure (mmHg): 128/72 Reference Range: 80 - 120 mg / dl Electronic Signature(s) Signed: 05/01/2022 4:43:48 PM By: [name] RN, BSN Entered By: [name] on 05/01/2022 07:42:57

## 2022-05-03 ENCOUNTER — Encounter (HOSPITAL_BASED_OUTPATIENT_CLINIC_OR_DEPARTMENT_OTHER): Payer: Medicare HMO | Admitting: Internal Medicine

## 2022-05-05 NOTE — Progress Notes (Signed)
Patient Care Team: Excell Seltzer, MD as PCP - General (Family Medicine) Rollene Rotunda, MD as PCP - Cardiology (Cardiology) Lanier Prude, MD as PCP - Electrophysiology (Cardiology) Jodelle Gross, NP as Nurse Practitioner (Cardiology) Kathyrn Sheriff, [facility] as Pharmacist (Pharmacist)  DIAGNOSIS: No diagnosis found.  SUMMARY OF ONCOLOGIC HISTORY: Oncology History   No history exists.    CHIEF COMPLIANT:   INTERVAL HISTORY: Gregory [name] is a   ALLERGIES:  is allergic to heparin.  MEDICATIONS:  Current Outpatient Medications  Medication Sig Dispense Refill   acetaminophen (TYLENOL) 500 MG tablet Take 1,000 mg by mouth every 6 (six) hours as needed for moderate pain.     allopurinol (ZYLOPRIM) 100 MG tablet Take 1 tablet by mouth once daily 90 tablet 1   ALPRAZolam (XANAX) 0.5 MG tablet TAKE 1 TABLET BY MOUTH ONCE DAILY AS  NEEDED  FOR  ANXIETY  TAKE  30  MINUTES  PRIOR  TO  PLANE FLIGHT OR AS NEEDED FOR ANXIETY 20 tablet 0   aspirin EC 81 MG tablet Take 1 tablet (81 mg total) by mouth daily. Swallow whole. 30 tablet 11   atorvastatin (LIPITOR) 80 MG tablet Take 1 tablet by mouth once daily 90 tablet 3   colchicine 0.6 MG tablet Take 2 tablets by mouth once daily 60 tablet 2   furosemide (LASIX) 20 MG tablet TAKE TWO TABLETS BY MOUTH IN THE MORNING AND TAKE ONE TABLET BY MOUTH IN THE AFTERNOON 270 tablet 3   JATENZO 237 MG CAPS Take 237 mg by mouth 2 (two) times daily.     ketoconazole (NIZORAL) 2 % shampoo Apply 1 application topically 2 (two) times a week. 120 mL 1   latanoprost (XALATAN) 0.005 % ophthalmic solution Place 1 drop into both eyes every morning.      metoprolol succinate (TOPROL-XL) 25 MG 24 hr tablet Take 1 tablet (25 mg total) by mouth 2 (two) times daily. 180 tablet 3   Misc Natural Products (OSTEO BI-FLEX/5-LOXIN ADVANCED) TABS Take 2 tablets by mouth daily.     potassium chloride SA (KLOR-CON M20) 20 MEQ tablet Take 1 tablet by mouth once  daily 90 tablet 3   sacubitril-valsartan (ENTRESTO) 97-103 MG Take 1 tablet by mouth 2 (two) times daily. 60 tablet 11   sertraline (ZOLOFT) 50 MG tablet Take 1 tablet by mouth once daily 90 tablet 0   spironolactone (ALDACTONE) 25 MG tablet Take 1 tablet by mouth once daily 90 tablet 2   traMADol (ULTRAM) 50 MG tablet TAKE 1 TABLET BY MOUTH EVERY 8 HOURS AS NEEDED FOR PAIN 90 tablet 0   traZODone (DESYREL) 50 MG tablet Take 0.5-1 tablets (25-50 mg total) by mouth at bedtime as needed for sleep. 90 tablet 1   No current facility-administered medications for this visit.    PHYSICAL EXAMINATION: ECOG PERFORMANCE STATUS: {CHL ONC ECOG PS:[phone]}  There were no vitals filed for this visit. There were no vitals filed for this visit.  BREAST:*** No palpable masses or nodules in either right or left breasts. No palpable axillary supraclavicular or infraclavicular adenopathy no breast tenderness or nipple discharge. (exam performed in the presence of a chaperone)  LABORATORY DATA:  I have reviewed the data as listed    Latest Ref Rng & Units 04/25/2022    8:15 AM 03/19/2022    9:31 AM 01/11/2022    7:58 AM  CMP  Glucose 70 - 99 mg/dL 92  161  90   BUN  6 - 23 mg/dL 17  16  18    Creatinine 0.40 - 1.50 mg/dL 1.61  0.96  0.45   Sodium 135 - 145 mEq/L 140  136  141   Potassium 3.5 - 5.1 mEq/L 4.6  4.7  4.4   Chloride 96 - 112 mEq/L 103  104  106   CO2 19 - 32 mEq/L 31  25  30    Calcium 8.4 - 10.5 mg/dL 9.0  8.6  9.4   Total Protein 6.0 - 8.3 g/dL 6.2   6.7   Total Bilirubin 0.2 - 1.2 mg/dL 1.5   1.4   Alkaline Phos 39 - 117 U/L 39   39   AST 0 - 37 U/L 64   50   ALT 0 - 53 U/L 19   19     Lab Results  Component Value Date   WBC 4.9 04/30/2022   HGB 11.0 (L) 04/30/2022   HCT 35.1 (L) 04/30/2022   MCV 101.2 (H) 04/30/2022   PLT 231 04/30/2022   NEUTROABS 2.9 04/30/2022    ASSESSMENT & PLAN:  No problem-specific Assessment & Plan notes found for this encounter.    No orders  of the defined types were placed in this encounter.  The patient has a good understanding of the overall plan. he agrees with it. he will call with any problems that may develop before the next visit here. Total time spent: 30 mins including face to face time and time spent for planning, charting and co-ordination of care   Sherlyn Lick, CMA 05/05/22    I Janan Ridge am acting as a Neurosurgeon for [employer]  ***

## 2022-05-07 ENCOUNTER — Other Ambulatory Visit: Payer: Self-pay | Admitting: Family Medicine

## 2022-05-07 DIAGNOSIS — F411 Generalized anxiety disorder: Secondary | ICD-10-CM

## 2022-05-08 ENCOUNTER — Inpatient Hospital Stay: Payer: Medicare HMO

## 2022-05-08 ENCOUNTER — Encounter (HOSPITAL_BASED_OUTPATIENT_CLINIC_OR_DEPARTMENT_OTHER): Payer: Medicare HMO | Attending: General Surgery | Admitting: General Surgery

## 2022-05-08 ENCOUNTER — Inpatient Hospital Stay: Payer: Medicare HMO | Attending: Hematology and Oncology | Admitting: Hematology and Oncology

## 2022-05-08 VITALS — BP 119/57 | HR 78 | Temp 97.5°F | Resp 18 | Ht 66.0 in | Wt 229.6 lb

## 2022-05-08 DIAGNOSIS — I110 Hypertensive heart disease with heart failure: Secondary | ICD-10-CM | POA: Insufficient documentation

## 2022-05-08 DIAGNOSIS — I872 Venous insufficiency (chronic) (peripheral): Secondary | ICD-10-CM | POA: Diagnosis not present

## 2022-05-08 DIAGNOSIS — D509 Iron deficiency anemia, unspecified: Secondary | ICD-10-CM | POA: Diagnosis not present

## 2022-05-08 DIAGNOSIS — I5032 Chronic diastolic (congestive) heart failure: Secondary | ICD-10-CM | POA: Diagnosis not present

## 2022-05-08 DIAGNOSIS — I351 Nonrheumatic aortic (valve) insufficiency: Secondary | ICD-10-CM | POA: Insufficient documentation

## 2022-05-08 DIAGNOSIS — I2510 Atherosclerotic heart disease of native coronary artery without angina pectoris: Secondary | ICD-10-CM | POA: Diagnosis not present

## 2022-05-08 DIAGNOSIS — I420 Dilated cardiomyopathy: Secondary | ICD-10-CM | POA: Diagnosis not present

## 2022-05-08 DIAGNOSIS — L97812 Non-pressure chronic ulcer of other part of right lower leg with fat layer exposed: Secondary | ICD-10-CM | POA: Diagnosis not present

## 2022-05-08 DIAGNOSIS — E538 Deficiency of other specified B group vitamins: Secondary | ICD-10-CM | POA: Insufficient documentation

## 2022-05-08 DIAGNOSIS — I890 Lymphedema, not elsewhere classified: Secondary | ICD-10-CM | POA: Diagnosis not present

## 2022-05-08 DIAGNOSIS — I739 Peripheral vascular disease, unspecified: Secondary | ICD-10-CM | POA: Diagnosis not present

## 2022-05-08 DIAGNOSIS — Z950 Presence of cardiac pacemaker: Secondary | ICD-10-CM | POA: Diagnosis not present

## 2022-05-08 LAB — CBC WITH DIFFERENTIAL (CANCER CENTER ONLY)
Abs Immature Granulocytes: 0.02 10*3/uL (ref 0.00–0.07)
Basophils Absolute: 0 10*3/uL (ref 0.0–0.1)
Basophils Relative: 0 %
Eosinophils Absolute: 0.1 10*3/uL (ref 0.0–0.5)
Eosinophils Relative: 2 %
HCT: 33.8 % — ABNORMAL LOW (ref 39.0–52.0)
Hemoglobin: 11 g/dL — ABNORMAL LOW (ref 13.0–17.0)
Immature Granulocytes: 1 %
Lymphocytes Relative: 26 %
Lymphs Abs: 1.1 10*3/uL (ref 0.7–4.0)
MCH: 33.5 pg (ref 26.0–34.0)
MCHC: 32.5 g/dL (ref 30.0–36.0)
MCV: 103 fL — ABNORMAL HIGH (ref 80.0–100.0)
Monocytes Absolute: 0.4 10*3/uL (ref 0.1–1.0)
Monocytes Relative: 9 %
Neutro Abs: 2.8 10*3/uL (ref 1.7–7.7)
Neutrophils Relative %: 62 %
Platelet Count: 182 10*3/uL (ref 150–400)
RBC: 3.28 MIL/uL — ABNORMAL LOW (ref 4.22–5.81)
RDW: 16.3 % — ABNORMAL HIGH (ref 11.5–15.5)
WBC Count: 4.4 10*3/uL (ref 4.0–10.5)
nRBC: 0 % (ref 0.0–0.2)

## 2022-05-08 LAB — IRON AND IRON BINDING CAPACITY (CC-WL,HP ONLY)
Iron: 152 ug/dL (ref 45–182)
Saturation Ratios: 41 % — ABNORMAL HIGH (ref 17.9–39.5)
TIBC: 368 ug/dL (ref 250–450)
UIBC: 216 ug/dL

## 2022-05-08 LAB — SAMPLE TO BLOOD BANK

## 2022-05-08 LAB — FERRITIN: Ferritin: 97 ng/mL (ref 24–336)

## 2022-05-08 MED ORDER — CYANOCOBALAMIN 1000 MCG/ML IJ SOLN
1000.0000 ug | Freq: Once | INTRAMUSCULAR | Status: AC
  Administered 2022-05-08: 1000 ug via INTRAMUSCULAR
  Filled 2022-05-08: qty 1

## 2022-05-08 MED ORDER — EPOETIN ALFA 40000 UNIT/ML IJ SOLN: 40000.0000 [IU] | Freq: Once | INTRAMUSCULAR | Status: DC

## 2022-05-08 NOTE — Assessment & Plan Note (Signed)
Elevated LDH with normal direct Coombs test: Concern for intravascular hemolysis possibly from severe valvular heart disease   11/17/2020: Hemoglobin 9.6, WBC 3.3, platelets 142, ANC 1.9, ferritin 158, iron saturation 31% (improv from 5%) 01/13/21: Hb 9.9, WBC: 5.5, MCV 105.6, Platelet 165, TB 1.3, Ferritin 50, Iron Sat: 25%,  01/16/2021: Hemoglobin 9.2, MCV 105.1, B12 117 (start B12 injections) 02/13/2021: Hemoglobin 10, MCV 103.1,   06/20/2021: Hemoglobin 10, MCV 100.3  09/12/2021: Hemoglobin 11.1 01/02/2022: Hemoglobin 10.8 (Retacrit injection started) 02/15/2022: Hemoglobin 10.7 (Retacrit changed to every 2 weeks) 03/15/2022: Hemoglobin 9.9, MCV 103.3 03/22/2022: Hemoglobin 9.6, MCV 101.7 04/30/2022: Hemoglobin 11, MCV 101.2   Recommending Retacrit every 2 weeks and B12 injections every week

## 2022-05-08 NOTE — Patient Instructions (Signed)
Vitamin B12 Deficiency Vitamin B12 deficiency occurs when the body does not have enough of this important vitamin. The body needs this vitamin: To make red blood cells. To make DNA. This is the genetic material inside cells. To help the nerves work properly so they can carry messages from the brain to the body. Vitamin B12 deficiency can cause health problems, such as not having enough red blood cells in the blood (anemia). This can lead to nerve damage if untreated. What are the causes? This condition may be caused by: Not eating enough foods that contain vitamin B12. Not having enough stomach acid and digestive fluids to properly absorb vitamin B12 from the food that you eat. Having certain diseases that make it hard to absorb vitamin B12. These diseases include Crohn's disease, chronic pancreatitis, and cystic fibrosis. An autoimmune disorder in which the body does not make enough of a protein (intrinsic factor) within the stomach, resulting in not enough absorption of vitamin B12. Having a surgery in which part of the stomach or small intestine is removed. Taking certain medicines that make it hard for the body to absorb vitamin B12. These include: Heartburn medicines, such as antacids and proton pump inhibitors. Some medicines that are used to treat diabetes. What increases the risk? The following factors may make you more likely to develop a vitamin B12 deficiency: Being an older adult. Eating a vegetarian or vegan diet that does not include any foods that come from animals. Eating a poor diet while you are pregnant. Taking certain medicines. Having alcoholism. What are the signs or symptoms? In some cases, there are no symptoms of this condition. If the condition leads to anemia or nerve damage, various symptoms may occur, such as: Weakness. Tiredness (fatigue). Loss of appetite. Numbness or tingling in your hands and feet. Redness and burning of the tongue. Depression,  confusion, or memory problems. Trouble walking. If anemia is severe, symptoms can include: Shortness of breath. Dizziness. Rapid heart rate. How is this diagnosed? This condition may be diagnosed with a blood test to measure the level of vitamin B12 in your blood. You may also have other tests, including: A group of tests that measure certain characteristics of blood cells (complete blood count, CBC). A blood test to measure intrinsic factor. A procedure where a thin tube with a camera on the end is used to look into your stomach or intestines (endoscopy). Other tests may be needed to discover the cause of the deficiency. How is this treated? Treatment for this condition depends on the cause. This condition may be treated by: Changing your eating and drinking habits, such as: Eating more foods that contain vitamin B12. Drinking less alcohol or no alcohol. Getting vitamin B12 injections. Taking vitamin B12 supplements by mouth (orally). Your health care provider will tell you which dose is best for you. Follow these instructions at home: Eating and drinking  Include foods in your diet that come from animals and contain a lot of vitamin B12. These include: Meats and poultry. This includes beef, pork, chicken, turkey, and organ meats, such as liver. Seafood. This includes clams, rainbow trout, salmon, tuna, and haddock. Eggs. Dairy foods such as milk, yogurt, and cheese. Eat foods that have vitamin B12 added to them (are fortified), such as ready-to-eat breakfast cereals. Check the label on the package to see if a food is fortified. The items listed above may not be a complete list of foods and beverages you can eat and drink. Contact a dietitian for   more information. Alcohol use Do not drink alcohol if: Your health care provider tells you not to drink. You are pregnant, may be pregnant, or are planning to become pregnant. If you drink alcohol: Limit how much you have to: 0-1 drink a  day for women. 0-2 drinks a day for men. Know how much alcohol is in your drink. In the [place] one drink equals one 12 oz bottle of beer (355 mL), one 5 oz glass of wine (148 mL), or one 1 oz glass of hard liquor (44 mL). General instructions Get vitamin B12 injections if told to by your health care provider. Take supplements only as told by your health care provider. Follow the directions carefully. Keep all follow-up visits. This is important. Contact a health care provider if: Your symptoms come back. Your symptoms get worse or do not improve with treatment. Get help right away: You develop shortness of breath. You have a rapid heart rate. You have chest pain. You become dizzy or you faint. These symptoms may be an emergency. Get help right away. Call 911. Do not wait to see if the symptoms will go away. Do not drive yourself to the hospital. Summary Vitamin B12 deficiency occurs when the body does not have enough of this important vitamin. Common causes include not eating enough foods that contain vitamin B12, not being able to absorb vitamin B12 from the food that you eat, having a surgery in which part of the stomach or small intestine is removed, or taking certain medicines. Eat foods that have vitamin B12 in them. Treatment may include making a change in the way you eat and drink, getting vitamin B12 injections, or taking vitamin B12 supplements. This information is not intended to replace advice given to you by your health care provider. Make sure you discuss any questions you have with your health care provider. Document Revised: 08/12/2020 Document Reviewed: 08/12/2020 Elsevier Patient Education  2023 [employer]  

## 2022-05-08 NOTE — Progress Notes (Signed)
Gregory, [name] ([id number]) [id number] Page 1 of 7 Visit Report for 05/08/2022 Chief Complaint Document Details Patient Name: Date of Service: Gregory [name], [place] RD J. 05/08/2022 7:30 A M Medical Record Number: [id number] Patient Account Number: [id number] Date of Birth/Sex: Treating RN: 12/14/1950 (72 y.o. M) Primary Care Provider: [name] Other Clinician: Referring Provider: Treating Provider/Extender: [name], [name] in Treatment: 4 Information Obtained from: Patient Chief Complaint Patient seen for complaints of Non-Healing Wound. Electronic Signature(s) Signed: 05/08/2022 7:56:53 AM By: Duanne Guess MD FACS Entered By: Duanne Guess on 05/08/2022 07:56:53 -------------------------------------------------------------------------------- HPI Details Patient Name: Date of Service: Gregory [name], Gregory RD J. 05/08/2022 7:30 A M Medical Record Number: [id number] Patient Account Number: [id number] Date of Birth/Sex: Treating RN: 1950-01-10 (72 y.o. M) Primary Care Provider: [name] Other Clinician: Referring Provider: Treating Provider/Extender: [name], [name] in Treatment: 4 History of Present Illness HPI Description: ADMISSION 04/10/2022 This is a 72 year old man with a past medical history significant for hypertension, severe aortic insufficiency status post aortic valvuloplasty, grade 1 diastolic dysfunction, peripheral vascular disease, permanent pacemaker, lymphedema, and coronary artery disease. He dropped a cinder block which struck his leg at the beginning of March 2024. He sought care in the ED on March 4. Plain films did not demonstrate any fracture or foreign body. Steri-Strips were applied. Apparently the patient was soaking the wound with hydrogen peroxide daily. He saw his PCP who gave him an IM injection of ceftriaxone and prescribed doxycycline. He was advised to elevate his legs and clean with  just warm soapy water and apply antibiotic ointment. Formal ABIs were done in February 2024 that showed noncompressible ABIs bilaterally and a TBI of 0.6 on the right and 0.59 on the left. There is a triangular wound on his left lower leg, just lateral to the anterior tibial surface. There is slough and nonviable fat present. No concern for infection. 04/16/2022: The wound is about the same size, but much cleaner this week. There is a little bit of slough on the surface. There is hypertrophic granulation tissue present. Edema control is good. 04/24/2022: His wound is smaller and cleaner today. Edema control is good. Minimal slough accumulation. Healthy-looking granulation tissue present. 05/01/2022: The wound is smaller by half a centimeter. It is flush with the surrounding skin and there has been no accumulation of slough. Edema control is excellent. 05/08/2022: The wound continues to contract at a very nice pace. It is clean with good granulation tissue and no slough or other debris accumulation. Electronic Signature(s) Signed: 05/08/2022 7:57:37 AM By: Duanne Guess MD FACS Entered By: Duanne Guess on 05/08/2022 07:57:37 -------------------------------------------------------------------------------- Physical Exam Details Patient Name: Date of Service: Gregory [name], Gregory RD J. 05/08/2022 7:30 A M Medical Record Number: [id number] Patient Account Number: [id number] Date of Birth/Sex: Treating RN: February 20, 1950 (72 y.o. M) Primary Care Provider: [name] Other Clinician: Referring Provider: Treating Provider/Extender: [name], [name] in Treatment: [address] CAMDIN, Gregory [name] ([id number]) [id number] Page 2 of 7 Constitutional Slightly hypertensive. . . . no acute distress. Respiratory Normal work of breathing on room air. Notes 05/08/2022: The wound continues to contract at a very nice pace. It is clean with good granulation tissue and no slough or other  debris accumulation. Electronic Signature(s) Signed: 05/08/2022 7:58:22 AM By: Duanne Guess MD FACS Entered By: Duanne Guess on 05/08/2022 07:58:22 -------------------------------------------------------------------------------- Physician Orders Details Patient Name: Date of Service: Gregory [name], Gregory RD J. 05/08/2022 7:30 A M Medical Record Number: [id number] Patient Account  Number: [id number] Date of Birth/Sex: Treating RN: [birth year] (72 y.o. [name] Primary Care Provider: [name] Other Clinician: Referring Provider: Treating Provider/Extender: [name], [name] in Treatment: 4 Verbal / Phone Orders: No Diagnosis Coding ICD-10 Coding Code Description [phone] Non-pressure chronic ulcer of other part of right lower leg with fat layer exposed I87.2 Venous insufficiency (chronic) (peripheral) I73.9 Peripheral vascular disease, unspecified I42.0 Dilated cardiomyopathy I35.1 Nonrheumatic aortic (valve) insufficiency I50.30 Unspecified diastolic (congestive) heart failure Follow-up Appointments ppointment in 1 week. - Dr. Lady Gary RM 1 Return A Tuesday 5/14 @ 0730 am Anesthetic Wound #1 Right,Lateral Lower Leg (In clinic) Topical Lidocaine 4% applied to wound bed Bathing/ Shower/ Hygiene May shower with protection but do not get wound dressing(s) wet. Protect dressing(s) with water repellant cover (for example, large plastic bag) or a cast cover and may then take shower. - may be purchased at [facility] or [facility] Edema Control - Lymphedema / SCD / Other Elevate legs to the level of the heart or above for 30 minutes daily and/or when sitting for 3-4 times a day throughout the day. - throughout the day Avoid standing for long periods of time. Exercise regularly Compression stocking or Garment 20-30 mm/Hg pressure to: - left leg daily Wound Treatment Wound #1 - Lower Leg Wound Laterality: Right, Lateral Peri-Wound Care: Sween Lotion (Moisturizing  lotion) 1 x Per Week/30 Days Discharge Instructions: Apply moisturizing lotion as directed Prim Dressing: Maxorb Extra Ag+ Alginate Dressing, 2x2 (in/in) 1 x Per Week/30 Days ary Discharge Instructions: Apply to wound bed as instructed Secondary Dressing: Woven Gauze Sponge, Non-Sterile 4x4 in 1 x Per Week/30 Days Discharge Instructions: Apply over primary dressing as directed. Compression Wrap: Urgo K2 Lite, two layer compression system, regular 1 x Per Week/30 Days Discharge Instructions: Apply Urgo K2 Lite as directed (alternative to 3 layer compression). Electronic Signature(s) Gregory [name], Gregory [name] ([id number]) [id number] Page 3 of 7 Signed: 05/08/2022 8:23:39 AM By: Duanne Guess MD FACS Entered By: Duanne Guess on 05/08/2022 07:58:32 -------------------------------------------------------------------------------- Problem List Details Patient Name: Date of Service: Gregory [name], Gregory RD J. 05/08/2022 7:30 A M Medical Record Number: [id number] Patient Account Number: [id number] Date of Birth/Sex: Treating RN: 11-22-1950 (72 y.o. [name] Primary Care Provider: [name] Other Clinician: Referring Provider: Treating Provider/Extender: [name], [name] in Treatment: 4 Active Problems ICD-10 Encounter Code Description Active Date MDM Diagnosis [phone] Non-pressure chronic ulcer of other part of right lower leg with fat layer 04/10/2022 No Yes exposed I87.2 Venous insufficiency (chronic) (peripheral) 04/10/2022 No Yes I73.9 Peripheral vascular disease, unspecified 04/10/2022 No Yes I42.0 Dilated cardiomyopathy 04/10/2022 No Yes I35.1 Nonrheumatic aortic (valve) insufficiency 04/10/2022 No Yes I50.30 Unspecified diastolic (congestive) heart failure 04/10/2022 No Yes Inactive Problems Resolved Problems Electronic Signature(s) Signed: 05/08/2022 7:56:41 AM By: Duanne Guess MD FACS Entered By: Duanne Guess on 05/08/2022  07:56:40 -------------------------------------------------------------------------------- Progress Note Details Patient Name: Date of Service: Gregory [name], Gregory RD J. 05/08/2022 7:30 A M Medical Record Number: [id number] Patient Account Number: [id number] Date of Birth/Sex: Treating RN: [birth year] (72 y.o. M) Primary Care Provider: [name] Other Clinician: Referring Provider: Treating Provider/Extender: [name], [name] in Treatment: 4 Subjective Chief Complaint Information obtained from Patient Patient seen for complaints of Non-Healing Wound. History of Present Illness (HPI) Gregory [name], Gregory [name] ([id number]) [id number] Page 4 of 7 ADMISSION 04/10/2022 This is a 72 year old man with a past medical history significant for hypertension, severe aortic insufficiency status post aortic valvuloplasty, grade 1 diastolic dysfunction, peripheral vascular  disease, permanent pacemaker, lymphedema, and coronary artery disease. He dropped a cinder block which struck his leg at the beginning of March 2024. He sought care in the ED on March 4. Plain films did not demonstrate any fracture or foreign body. Steri-Strips were applied. Apparently the patient was soaking the wound with hydrogen peroxide daily. He saw his PCP who gave him an IM injection of ceftriaxone and prescribed doxycycline. He was advised to elevate his legs and clean with just warm soapy water and apply antibiotic ointment. Formal ABIs were done in February 2024 that showed noncompressible ABIs bilaterally and a TBI of 0.6 on the right and 0.59 on the left. There is a triangular wound on his left lower leg, just lateral to the anterior tibial surface. There is slough and nonviable fat present. No concern for infection. 04/16/2022: The wound is about the same size, but much cleaner this week. There is a little bit of slough on the surface. There is hypertrophic granulation tissue present. Edema  control is good. 04/24/2022: His wound is smaller and cleaner today. Edema control is good. Minimal slough accumulation. Healthy-looking granulation tissue present. 05/01/2022: The wound is smaller by half a centimeter. It is flush with the surrounding skin and there has been no accumulation of slough. Edema control is excellent. 05/08/2022: The wound continues to contract at a very nice pace. It is clean with good granulation tissue and no slough or other debris accumulation. Patient History Information obtained from Patient, Chart. Family History Unknown History. Social History Never smoker, Marital Status - Married, Alcohol Use - Moderate, Drug Use - Current History - occaisional TCH, Caffeine Use - Moderate. Medical History Eyes Patient has history of Cataracts - bil extractions Ear/Nose/Mouth/Throat Denies history of Chronic sinus problems/congestion, Middle ear problems Hematologic/Lymphatic Patient has history of Anemia - macrocytic Cardiovascular Patient has history of Arrhythmia - afib, complete heart block, Coronary Artery Disease, Hypertension, Myocardial Infarction, Peripheral Arterial Disease Endocrine Denies history of Type I Diabetes, Type II Diabetes Genitourinary Denies history of End Stage Renal Disease Integumentary (Skin) Denies history of History of Burn Musculoskeletal Patient has history of Gout, Osteoarthritis Denies history of Rheumatoid Arthritis, Osteomyelitis Oncologic Denies history of Received Chemotherapy, Received Radiation Psychiatric Patient has history of Confinement Anxiety Denies history of Anorexia/bulimia Hospitalization/Surgery History - pacemaker insertion 10/2020. - balloon aortic valve valvuloplasty. - CABG 03/2020. - aortic valve replacement. - cataract extraction. Medical A Surgical History Notes nd Cardiovascular aortic insufficiency, cardiomyopathy, pacemaker Objective Constitutional Slightly hypertensive. no acute distress. Vitals  Time Taken: 7:40 AM, Height: 66 in, Weight: 225 lbs, BMI: 36.3, Temperature: 97.9 F, Pulse: 74 bpm, Respiratory Rate: 18 breaths/min, Blood Pressure: 143/79 mmHg. Respiratory Normal work of breathing on room air. General Notes: 05/08/2022: The wound continues to contract at a very nice pace. It is clean with good granulation tissue and no slough or other debris accumulation. Integumentary (Hair, Skin) Wound #1 status is Open. Original cause of wound was Trauma. The date acquired was: 03/05/2022. The wound has been in treatment 4 weeks. The wound is located on the Right,Lateral Lower Leg. The wound measures 0.7cm length x 0.7cm width x 0.1cm depth; 0.385cm^2 area and 0.038cm^3 volume. There is Fat Gregory [name], Gregory [name] ([id number]) [id number].pdf Page 5 of 7 Layer (Subcutaneous Tissue) exposed. There is no tunneling or undermining noted. There is a small amount of serosanguineous drainage noted. The wound margin is flat and intact. There is medium (34-66%) red granulation within the wound bed. There is a medium (34-66%) amount of  necrotic tissue within the wound bed including Adherent Slough. The periwound skin appearance had no abnormalities noted for texture. The periwound skin appearance had no abnormalities noted for moisture. The periwound skin appearance exhibited: Hemosiderin Staining. Periwound temperature was noted as No Abnormality. Assessment Active Problems ICD-10 Non-pressure chronic ulcer of other part of right lower leg with fat layer exposed Venous insufficiency (chronic) (peripheral) Peripheral vascular disease, unspecified Dilated cardiomyopathy Nonrheumatic aortic (valve) insufficiency Unspecified diastolic (congestive) heart failure Procedures Wound #1 Pre-procedure diagnosis of Wound #1 is a Venous Leg Ulcer located on the Right,Lateral Lower Leg . There was a Double Layer Compression Therapy Procedure by [name], RN. Post procedure Diagnosis  Wound #1: Same as Pre-Procedure Notes: urgo lite. Plan Follow-up Appointments: Return Appointment in 1 week. - Dr. Lady Gary RM 1 Tuesday 5/14 @ 0730 am Anesthetic: Wound #1 Right,Lateral Lower Leg: (In clinic) Topical Lidocaine 4% applied to wound bed Bathing/ Shower/ Hygiene: May shower with protection but do not get wound dressing(s) wet. Protect dressing(s) with water repellant cover (for example, large plastic bag) or a cast cover and may then take shower. - may be purchased at [facility] or [facility] Edema Control - Lymphedema / SCD / Other: Elevate legs to the level of the heart or above for 30 minutes daily and/or when sitting for 3-4 times a day throughout the day. - throughout the day Avoid standing for long periods of time. Exercise regularly Compression stocking or Garment 20-30 mm/Hg pressure to: - left leg daily WOUND #1: - Lower Leg Wound Laterality: Right, Lateral Peri-Wound Care: Sween Lotion (Moisturizing lotion) 1 x Per Week/30 Days Discharge Instructions: Apply moisturizing lotion as directed Prim Dressing: Maxorb Extra Ag+ Alginate Dressing, 2x2 (in/in) 1 x Per Week/30 Days ary Discharge Instructions: Apply to wound bed as instructed Secondary Dressing: Woven Gauze Sponge, Non-Sterile 4x4 in 1 x Per Week/30 Days Discharge Instructions: Apply over primary dressing as directed. Com pression Wrap: Urgo K2 Lite, two layer compression system, regular 1 x Per Week/30 Days Discharge Instructions: Apply Urgo K2 Lite as directed (alternative to 3 layer compression). 05/08/2022: The wound continues to contract at a very nice pace. It is clean with good granulation tissue and no slough or other debris accumulation. No debridement was necessary today. We will continue silver alginate and 3 layer compression equivalent. I anticipate he will likely be closed within 2 weeks or so. Follow-up in 1 week. Electronic Signature(s) Signed: 05/08/2022 7:59:12 AM By: Duanne Guess MD FACS Entered  By: Duanne Guess on 05/08/2022 07:59:12 -------------------------------------------------------------------------------- HxROS Details Patient Name: Date of Service: Gregory [name], Gregory RD J. 05/08/2022 7:30 A M Medical Record Number: [id number] Patient Account Number: [id number] Date of Birth/Sex: Treating RN: 19-Nov-1950 (71 y.o. Judie Petit) Gregory [name], Gregory [name] ([id number]) [id number] Page 6 of 7 Primary Care Provider: [name] Other Clinician: Referring Provider: Treating Provider/Extender: [name], [name] in Treatment: 4 Information Obtained From Patient Chart Eyes Medical History: Positive for: Cataracts - bil extractions Ear/Nose/Mouth/Throat Medical History: Negative for: Chronic sinus problems/congestion; Middle ear problems Hematologic/Lymphatic Medical History: Positive for: Anemia - macrocytic Cardiovascular Medical History: Positive for: Arrhythmia - afib, complete heart block; Coronary Artery Disease; Hypertension; Myocardial Infarction; Peripheral Arterial Disease Past Medical History Notes: aortic insufficiency, cardiomyopathy, pacemaker Endocrine Medical History: Negative for: Type I Diabetes; Type II Diabetes Genitourinary Medical History: Negative for: End Stage Renal Disease Integumentary (Skin) Medical History: Negative for: History of Burn Musculoskeletal Medical History: Positive for: Gout; Osteoarthritis Negative for: Rheumatoid Arthritis; Osteomyelitis Oncologic Medical History: Negative  for: Received Chemotherapy; Received Radiation Psychiatric Medical History: Positive for: Confinement Anxiety Negative for: Anorexia/bulimia HBO Extended History Items Eyes: Cataracts Immunizations Pneumococcal Vaccine: Received Pneumococcal Vaccination: Yes Received Pneumococcal Vaccination On or After 60th Birthday: Yes Implantable Devices Yes Hospitalization / Surgery History Type of  Hospitalization/Surgery pacemaker insertion 10/2020 Gregory [name], Gregory [name] ([id number]) [phone].pdf Page 7 of 7 balloon aortic valve valvuloplasty CABG 03/2020 aortic valve replacement cataract extraction Family and Social History Unknown History: Yes; Never smoker; Marital Status - Married; Alcohol Use: Moderate; Drug Use: Current History - occaisional TCH; Caffeine Use: Moderate; Financial Concerns: No; Food, Clothing or Shelter Needs: No; Support System Lacking: No; Transportation Concerns: No Electronic Signature(s) Signed: 05/08/2022 8:23:39 AM By: Duanne Guess MD FACS Entered By: Duanne Guess on 05/08/2022 07:57:43 -------------------------------------------------------------------------------- SuperBill Details Patient Name: Date of Service: Gregory [name], Gregory RD J. 05/08/2022 Medical Record Number: [id number] Patient Account Number: [id number] Date of Birth/Sex: Treating RN: 06/08/1950 (72 y.o. M) Primary Care Provider: [name] Other Clinician: Referring Provider: Treating Provider/Extender: [name], [name] in Treatment: 4 Diagnosis Coding ICD-10 Codes Code Description [phone] Non-pressure chronic ulcer of other part of right lower leg with fat layer exposed I87.2 Venous insufficiency (chronic) (peripheral) I73.9 Peripheral vascular disease, unspecified I42.0 Dilated cardiomyopathy I35.1 Nonrheumatic aortic (valve) insufficiency I50.30 Unspecified diastolic (congestive) heart failure Physician Procedures : CPT4 Code Description Modifier [id number] - WC PHYS LEVEL 3 - EST PT 25 ICD-10 Diagnosis Description L97.812 Non-pressure chronic ulcer of other part of right lower leg with fat layer exposed I87.2 Venous insufficiency (chronic) (peripheral) I50.30  Unspecified diastolic (congestive) heart failure I73.9 Peripheral vascular disease, unspecified Quantity: 1 Electronic Signature(s) Signed: 05/08/2022 8:02:57 AM By: Duanne Guess MD FACS Entered By: Duanne Guess on 05/08/2022 08:02:57

## 2022-05-09 NOTE — Progress Notes (Signed)
EON, BRAKE ([id number]) [id number] Page 1 of 7 Visit Report for 05/08/2022 Arrival Information Details Patient Name: Date of Service: Gregory [name], [place] RD J. 05/08/2022 7:30 A M Medical Record Number: [id number] Patient Account Number: [id number] Date of Birth/Sex: Treating RN: 05-31-1950 (72 y.o. Gregory [name] Primary Care Oluwaseyi Raffel: [name] Other Clinician: Referring Kateri Balch: Treating Jalin Alicea/Extender: [name], [name] in Treatment: 4 Visit Information History Since Last Visit Added or deleted any medications: No Patient Arrived: [name] Any new allergies or adverse reactions: No Arrival Time: 07:39 Had a fall or experienced change in No Accompanied By: self activities of daily living that may affect Transfer Assistance: None risk of falls: Patient Identification Verified: Yes Signs or symptoms of abuse/neglect since last visito No Secondary Verification Process Completed: Yes Hospitalized since last visit: No Patient Requires Transmission-Based Precautions: No Implantable device outside of the clinic excluding No Patient Has Alerts: Yes cellular tissue based products placed in the center Patient Alerts: R ABI N/C since last visit: Has Dressing in Place as Prescribed: Yes Has Compression in Place as Prescribed: Yes Pain Present Now: No Electronic Signature(s) Signed: 05/09/2022 4:26:39 PM By: [name] RN, BSN Entered By: [name] on 05/08/2022 07:40:22 -------------------------------------------------------------------------------- Compression Therapy Details Patient Name: Date of Service: Gregory [name], [name] 05/08/2022 7:30 A M Medical Record Number: [id number] Patient Account Number: [id number] Date of Birth/Sex: Treating RN: 1950/10/21 (72 y.o. Gregory [name] Primary Care Mendy Chou: [name] Other Clinician: Referring Marinus Eicher: Treating Lometa Riggin/Extender: [name], [name] in  Treatment: 4 Compression Therapy Performed for Wound Assessment: Wound #1 Right,Lateral Lower Leg Performed By: Clinician [name], RN Compression Type: Double Layer Post Procedure Diagnosis Same as Pre-procedure Notes urgo lite Electronic Signature(s) Signed: 05/09/2022 4:26:39 PM By: [name] RN, BSN Entered By: [name] on 05/08/2022 07:54:41 -------------------------------------------------------------------------------- Encounter Discharge Information Details Patient Name: Date of Service: Gregory [name], [name] 05/08/2022 7:30 A M Medical Record Number: [id number] Patient Account Number: [id number] Date of Birth/Sex: Treating RN: Mar 07, 1950 (72 y.o. Gregory [name] Primary Care Marai Teehan: [name] Other Clinician: Referring Mani Celestin: Treating Reef Achterberg/Extender: [name], [name] in Treatment: 4 Encounter Discharge Information Items Discharge Condition: Stable Gregory [name] ([id number]) [phone].pdf Page 2 of 7 Ambulatory Status: Cane Discharge Destination: Home Transportation: Private Auto Accompanied By: self Schedule Follow-up Appointment: Yes Clinical Summary of Care: Patient Declined Electronic Signature(s) Signed: 05/09/2022 4:26:39 PM By: [name] RN, BSN Entered By: [name] on 05/08/2022 08:12:18 -------------------------------------------------------------------------------- Lower Extremity Assessment Details Patient Name: Date of Service: Gregory [name], [name] 05/08/2022 7:30 A M Medical Record Number: [id number] Patient Account Number: [id number] Date of Birth/Sex: Treating RN: 01-Oct-1950 (72 y.o. Gregory [name] Primary Care Reginna [name]: [name] Other Clinician: Referring Gregory [name]: Treating Gregory [name]/Extender: [name], [name] in Treatment: 4 Edema Assessment Assessed: [Left: No] [Right: No] Edema: [Left: Ye] [Right: s] Calf Left: Right: Point of  Measurement: From Medial Instep 35.5 cm Ankle Left: Right: Point of Measurement: From Medial Instep 22.8 cm Vascular Assessment Pulses: Dorsalis Pedis Palpable: [Right:Yes] Electronic Signature(s) Signed: 05/09/2022 4:26:39 PM By: [name] RN, BSN Entered By: [name] on 05/08/2022 07:44:01 -------------------------------------------------------------------------------- Multi Wound Chart Details Patient Name: Date of Service: Gregory [name], [name] 05/08/2022 7:30 A M Medical Record Number: [id number] Patient Account Number: [id number] Date of Birth/Sex: Treating RN: June 15, 1950 (72 y.o. M) Primary Care Gregory [name]: [name] Other Clinician: Referring Gregory [name]: Treating Gregory [name]/Extender: [name], [name] in Treatment: 4 Vital  Signs Height(in): 66 Pulse(bpm): 74 Weight(lbs): 225 Blood Pressure(mmHg): 143/79 Body Mass Index(BMI): 36.3 Temperature(F): 97.9 Respiratory Rate(breaths/min): 18 [1:Photos:] [N/A:N/A] Right, Lateral Lower Leg N/A N/A Wound Location: Trauma N/A N/A Wounding Event: Venous Leg Ulcer N/A N/A Primary Etiology: Cataracts, Anemia, Arrhythmia, N/A N/A Comorbid History: Coronary Artery Disease, Hypertension, Myocardial Infarction, Peripheral Arterial Disease, Gout, Osteoarthritis, Confinement Anxiety 03/05/2022 N/A N/A Date Acquired: 4 N/A N/A Weeks of Treatment: Open N/A N/A Wound Status: No N/A N/A Wound Recurrence: 0.7x0.7x0.1 N/A N/A Measurements L x W x D (cm) 0.385 N/A N/A A (cm) : rea 0.038 N/A N/A Volume (cm) : 84.80% N/A N/A % Reduction in Area: 85.00% N/A N/A % Reduction in Volume: Full Thickness Without Exposed N/A N/A Classification: Support Structures Small N/A N/A Exudate Amount: Serosanguineous N/A N/A Exudate Type: red, brown N/A N/A Exudate Color: Flat and Intact N/A N/A Wound Margin: Medium (34-66%) N/A N/A Granulation Amount: Red N/A N/A Granulation Quality: Medium (34-66%) N/A  N/A Necrotic Amount: Fat Layer (Subcutaneous Tissue): Yes N/A N/A Exposed Structures: Fascia: No Tendon: No Muscle: No Joint: No Bone: No Small (1-33%) N/A N/A Epithelialization: No Abnormalities Noted N/A N/A Periwound Skin Texture: No Abnormalities Noted N/A N/A Periwound Skin Moisture: Hemosiderin Staining: Yes N/A N/A Periwound Skin Color: No Abnormality N/A N/A Temperature: Compression Therapy N/A N/A Procedures Performed: Treatment Notes Electronic Signature(s) Signed: 05/08/2022 7:56:48 AM By: Duanne Guess MD FACS Entered By: Duanne Guess on 05/08/2022 07:56:48 -------------------------------------------------------------------------------- Multi-Disciplinary Care Plan Details Patient Name: Date of Service: Gregory [name], [name] 05/08/2022 7:30 A M Medical Record Number: [id number] Patient Account Number: [id number] Date of Birth/Sex: Treating RN: Jun 24, 1950 (72 y.o. Gregory [name] Primary Care Christop Hippert: [name] Other Clinician: Referring Cartrell Bentsen: Treating Yasiel Goyne/Extender: [name], [name] in Treatment: 4 Multidisciplinary Care Plan reviewed with physician Active Inactive Venous Leg Ulcer Nursing Diagnoses: Actual venous Insuffiency (use after diagnosis is confirmed) Knowledge deficit related to disease process and management Goals: Patient will maintain optimal edema control Date Initiated: 04/10/2022 Target Resolution Date: 06/05/2022 Goal Status: Active CHRYSTIAN, LODATO ([id number]) [phone].pdf Page 4 of 7 Interventions: Assess peripheral edema status every visit. Compression as ordered Treatment Activities: Therapeutic compression applied : 04/10/2022 Notes: Wound/Skin Impairment Nursing Diagnoses: Impaired tissue integrity Knowledge deficit related to ulceration/compromised skin integrity Goals: Patient/caregiver will verbalize understanding of skin care regimen Date Initiated:  04/10/2022 Target Resolution Date: 06/05/2022 Goal Status: Active Ulcer/skin breakdown will have a volume reduction of 30% by week 4 Date Initiated: 04/10/2022 Date Inactivated: 05/08/2022 Target Resolution Date: 05/08/2022 Goal Status: Met Ulcer/skin breakdown will have a volume reduction of 50% by week 8 Date Initiated: 05/08/2022 Target Resolution Date: 06/05/2022 Goal Status: Active Interventions: Assess patient/caregiver ability to obtain necessary supplies Assess patient/caregiver ability to perform ulcer/skin care regimen upon admission and as needed Assess ulceration(s) every visit Provide education on ulcer and skin care Treatment Activities: Skin care regimen initiated : 04/10/2022 Topical wound management initiated : 04/10/2022 Notes: Electronic Signature(s) Signed: 05/09/2022 4:26:39 PM By: [name] RN, BSN Entered By: [name] on 05/08/2022 07:51:02 -------------------------------------------------------------------------------- Pain Assessment Details Patient Name: Date of Service: Gregory [name], [name] 05/08/2022 7:30 A M Medical Record Number: [id number] Patient Account Number: [id number] Date of Birth/Sex: Treating RN: [birth year] (72 y.o. Gregory [name] Primary Care Rosaland Shiffman: [name] Other Clinician: Referring Gar Glance: Treating Brockton Mckesson/Extender: [name], [name] in Treatment: 4 Active Problems Location of Pain Severity and Description of Pain Patient Has Paino No Site Locations Rate the pain. Current  Pain Level: 0 RANON, FUERTE ([id number]) [phone].pdf Page 5 of 7 Pain Management and Medication Current Pain Management: Electronic Signature(s) Signed: 05/09/2022 4:26:39 PM By: [name] RN, BSN Entered By: [name] on 05/08/2022 07:43:37 -------------------------------------------------------------------------------- Patient/Caregiver Education Details Patient Name: Date of Service: Gregory [name], [name] 5/7/2024andnbsp7:30 A M Medical Record Number: [id number] Patient Account Number: [id number] Date of Birth/Gender: Treating RN: 11-15-1950 (72 y.o. Gregory [name] Primary Care Physician: [name] Other Clinician: Referring Physician: Treating Physician/Extender: [name], [name] in Treatment: 4 Education Assessment Education Provided To: Patient Education Topics Provided Venous: Methods: Explain/Verbal Responses: Reinforcements needed, State content correctly Electronic Signature(s) Signed: 05/09/2022 4:26:39 PM By: [name] RN, BSN Entered By: [name] on 05/08/2022 07:51:18 -------------------------------------------------------------------------------- Wound Assessment Details Patient Name: Date of Service: Gregory [name], [name] 05/08/2022 7:30 A M Medical Record Number: [id number] Patient Account Number: [id number] Date of Birth/Sex: Treating RN: 12-Mar-1950 (72 y.o. Gregory [name] Primary Care Carlisle Torgeson: [name] Other Clinician: Referring Francyne Arreaga: Treating Durinda Buzzelli/Extender: [name], [name] in Treatment: 4 Wound Status Wound Number: 1 Primary Venous Leg Ulcer Etiology: Wound Location: Right, Lateral Lower Leg Wound Open Wounding Event: Trauma Status: Date Acquired: 03/05/2022 Comorbid Cataracts, Anemia, Arrhythmia, Coronary Artery Disease, Weeks Of Treatment: 4 History: Hypertension, Myocardial Infarction, Peripheral Arterial Disease, Clustered Wound: No Gout, Osteoarthritis, Confinement Anxiety Photos AMRAM, PLYMIRE ([id number]) [phone].pdf Page 6 of 7 Wound Measurements Length: (cm) 0.7 Width: (cm) 0.7 Depth: (cm) 0.1 Area: (cm) 0.385 Volume: (cm) 0.038 % Reduction in Area: 84.8% % Reduction in Volume: 85% Epithelialization: Small (1-33%) Tunneling: No Undermining: No Wound Description Classification: Full Thickness Without Exposed Support  Structures Wound Margin: Flat and Intact Exudate Amount: Small Exudate Type: Serosanguineous Exudate Color: red, brown Foul Odor After Cleansing: No Slough/Fibrino Yes Wound Bed Granulation Amount: Medium (34-66%) Exposed Structure Granulation Quality: Red Fascia Exposed: No Necrotic Amount: Medium (34-66%) Fat Layer (Subcutaneous Tissue) Exposed: Yes Necrotic Quality: Adherent Slough Tendon Exposed: No Muscle Exposed: No Joint Exposed: No Bone Exposed: No Periwound Skin Texture Texture Color No Abnormalities Noted: Yes No Abnormalities Noted: No Hemosiderin Staining: Yes Moisture No Abnormalities Noted: Yes Temperature / Pain Temperature: No Abnormality Treatment Notes Wound #1 (Lower Leg) Wound Laterality: Right, Lateral Cleanser Peri-Wound Care Sween Lotion (Moisturizing lotion) Discharge Instruction: Apply moisturizing lotion as directed Topical Primary Dressing Maxorb Extra Ag+ Alginate Dressing, 2x2 (in/in) Discharge Instruction: Apply to wound bed as instructed Secondary Dressing Woven Gauze Sponge, Non-Sterile 4x4 in Discharge Instruction: Apply over primary dressing as directed. Secured With Compression Wrap Urgo K2 Lite, two layer compression system, regular Discharge Instruction: Apply Urgo K2 Lite as directed (alternative to 3 layer compression). Compression Stockings Add-Ons Electronic Signature(s) Signed: 05/09/2022 4:26:39 PM By: [name] RN, BSN Entered By: [name] on 05/08/2022 07:48:51 -------------------------------------------------------------------------------- Vitals Details Patient Name: Date of Service: Gregory [name], [name] 05/08/2022 7:30 A M Medical Record Number: [id number] Patient Account Number: [id number] Date of Birth/Sex: Treating RN: 06/08/1950 (72 y.o. Gregory [name] Primary Care Imanni Burdine: [name] Other Clinician: Referring Jersi Mcmaster: Treating Fergus Throne/Extender: Rahi, [place] ([id number]) [id number] Page 7 of 7 Weeks in Treatment: 4 Vital Signs Time Taken: 07:40 Temperature (F): 97.9 Height (in): 66 Pulse (bpm): 74 Weight (lbs): 225 Respiratory Rate (breaths/min): 18 Body Mass Index (BMI): 36.3 Blood Pressure (mmHg): 143/79 Reference Range: 80 - 120 mg / dl Electronic Signature(s) Signed: 05/09/2022 4:26:39 PM By: [name] RN, BSN Entered By: [name]  on 05/08/2022 07:40:47

## 2022-05-15 ENCOUNTER — Encounter (HOSPITAL_BASED_OUTPATIENT_CLINIC_OR_DEPARTMENT_OTHER): Payer: Medicare HMO | Admitting: General Surgery

## 2022-05-15 DIAGNOSIS — I351 Nonrheumatic aortic (valve) insufficiency: Secondary | ICD-10-CM | POA: Diagnosis not present

## 2022-05-15 DIAGNOSIS — I739 Peripheral vascular disease, unspecified: Secondary | ICD-10-CM | POA: Diagnosis not present

## 2022-05-15 DIAGNOSIS — I2510 Atherosclerotic heart disease of native coronary artery without angina pectoris: Secondary | ICD-10-CM | POA: Diagnosis not present

## 2022-05-15 DIAGNOSIS — I110 Hypertensive heart disease with heart failure: Secondary | ICD-10-CM | POA: Diagnosis not present

## 2022-05-15 DIAGNOSIS — I420 Dilated cardiomyopathy: Secondary | ICD-10-CM | POA: Diagnosis not present

## 2022-05-15 DIAGNOSIS — I5032 Chronic diastolic (congestive) heart failure: Secondary | ICD-10-CM | POA: Diagnosis not present

## 2022-05-15 DIAGNOSIS — Z950 Presence of cardiac pacemaker: Secondary | ICD-10-CM | POA: Diagnosis not present

## 2022-05-15 DIAGNOSIS — I872 Venous insufficiency (chronic) (peripheral): Secondary | ICD-10-CM | POA: Diagnosis not present

## 2022-05-15 DIAGNOSIS — I890 Lymphedema, not elsewhere classified: Secondary | ICD-10-CM | POA: Diagnosis not present

## 2022-05-15 DIAGNOSIS — L97812 Non-pressure chronic ulcer of other part of right lower leg with fat layer exposed: Secondary | ICD-10-CM | POA: Diagnosis not present

## 2022-05-15 NOTE — Progress Notes (Signed)
Gregory, [name] ([id number]) [id number] Page 1 of 3 Visit Report for 05/15/2022 Chief Complaint Document Details Patient Name: Date of Service: Gregory [name], [place] RD J. 05/15/2022 7:30 A M Medical Record Number: [id number] Patient Account Number: [id number] Date of Birth/Sex: Treating RN: [birth year] (72 y.o. Gregory [name] Primary Care Provider: Kerby [name] Other Clinician: Referring Provider: Treating Provider/Extender: [name], [name] in Treatment: 5 Information Obtained from: Patient Chief Complaint Patient seen for complaints of Non-Healing Wound. Electronic Signature(s) Signed: 05/15/2022 7:56:34 AM By: Gregory [name] FACS Entered By: Gregory [name] on 05/15/2022 07:56:34 -------------------------------------------------------------------------------- HPI Details Patient Name: Date of Service: Gregory [name], [name] 05/15/2022 7:30 A M Medical Record Number: [id number] Patient Account Number: [id number] Date of Birth/Sex: Treating RN: 1950/05/20 (72 y.o. Gregory [name] Primary Care Provider: Kerby [name] Other Clinician: Referring Provider: Treating Provider/Extender: [name], [name] in Treatment: 5 History of Present Illness HPI Description: ADMISSION 04/10/2022 This is a 72 year old man with a past medical history significant for hypertension, severe aortic insufficiency status post aortic valvuloplasty, grade 1 diastolic dysfunction, peripheral vascular disease, permanent pacemaker, lymphedema, and coronary artery disease. He dropped a cinder block which struck his leg at the beginning of March 2024. He sought care in the ED on March 4. Plain films did not demonstrate any fracture or foreign body. Steri-Strips were applied. Apparently the patient was soaking the wound with hydrogen peroxide daily. He saw his PCP who gave him an IM injection of ceftriaxone and prescribed doxycycline. He was advised  to elevate his legs and clean with just warm soapy water and apply antibiotic ointment. Formal ABIs were done in February 2024 that showed noncompressible ABIs bilaterally and a TBI of 0.6 on the right and 0.59 on the left. There is a triangular wound on his left lower leg, just lateral to the anterior tibial surface. There is slough and nonviable fat present. No concern for infection. 04/16/2022: The wound is about the same size, but much cleaner this week. There is a little bit of slough on the surface. There is hypertrophic granulation tissue present. Edema control is good. 04/24/2022: His wound is smaller and cleaner today. Edema control is good. Minimal slough accumulation. Healthy-looking granulation tissue present. 05/01/2022: The wound is smaller by half a centimeter. It is flush with the surrounding skin and there has been no accumulation of slough. Edema control is excellent. 05/08/2022: The wound continues to contract at a very nice pace. It is clean with good granulation tissue and no slough or other debris accumulation. 05/15/2022: The wound is smaller again today. The surface is clean but a little bit on the dry side. Edema control is excellent. Electronic Signature(s) Signed: 05/15/2022 7:57:02 AM By: Gregory [name] FACS Entered By: Gregory [name] on 05/15/2022 07:57:02 -------------------------------------------------------------------------------- Physician Orders Details Patient Name: Date of Service: Gregory [name], [name] 05/15/2022 7:30 A M Medical Record Number: [id number] Patient Account Number: [id number] Date of Birth/Sex: Treating RN: [birth year] (72 y.o. Gregory [name] Primary Care Provider: Kerby [name] Other Clinician: Referring Provider: Treating Provider/Extender: Bomani, Piotrowicz ([id number]) [id number] Page 2 of 3 Weeks in Treatment: 5 Verbal / Phone Orders: No Diagnosis Coding ICD-10 Coding Code  Description [phone] Non-pressure chronic ulcer of other part of right lower leg with fat layer exposed I87.2 Venous insufficiency (chronic) (peripheral) I73.9 Peripheral vascular disease, unspecified I42.0 Dilated cardiomyopathy I35.1 Nonrheumatic aortic (valve) insufficiency I50.30 Unspecified diastolic (congestive) heart failure Follow-up Appointments ppointment in 1 week. -  Dr. Lady Gary RM 2 Return A Wed 5/22 @ 08:30 am Anesthetic Wound #1 Right,Lateral Lower Leg (In clinic) Topical Lidocaine 4% applied to wound bed Bathing/ Shower/ Hygiene May shower with protection but do not get wound dressing(s) wet. Protect dressing(s) with water repellant cover (for example, large plastic bag) or a cast cover and may then take shower. - may be purchased at [facility] or [facility] Edema Control - Lymphedema / SCD / Other Elevate legs to the level of the heart or above for 30 minutes daily and/or when sitting for 3-4 times a day throughout the day. - throughout the day Avoid standing for long periods of time. Exercise regularly Compression stocking or Garment 20-30 mm/Hg pressure to: - left leg daily Wound Treatment Wound #1 - Lower Leg Wound Laterality: Right, Lateral Peri-Wound Care: Sween Lotion (Moisturizing lotion) 1 x Per Week/30 Days Discharge Instructions: Apply moisturizing lotion as directed Prim Dressing: Promogran Prisma Matrix, 4.34 (sq in) (silver collagen) 1 x Per Week/30 Days ary Discharge Instructions: Moisten collagen with saline or hydrogel Secondary Dressing: Woven Gauze Sponge, Non-Sterile 4x4 in 1 x Per Week/30 Days Discharge Instructions: Apply over primary dressing as directed. Compression Wrap: ThreePress (3 layer compression wrap) 1 x Per Week/30 Days Discharge Instructions: Apply three layer compression as directed. Electronic Signature(s) Unsigned Entered By: [name] on 05/15/2022  07:57:05 -------------------------------------------------------------------------------- Problem List Details Patient Name: Date of Service: Gregory [name], [place] RD J. 05/15/2022 7:30 A M Medical Record Number: [id number] Patient Account Number: [id number] Date of Birth/Sex: Treating RN: 1950/06/17 (72 y.o. Gregory [name] Primary Care Provider: Kerby [name] Other Clinician: Referring Provider: Treating Provider/Extender: [name], [name] in Treatment: 5 Active Problems ICD-10 Encounter Code Description Active Date MDM Diagnosis [phone] Non-pressure chronic ulcer of other part of right lower leg with fat layer 04/10/2022 No Yes BENAIAH, MIGNEAULT ([id number]) [id number] Page 3 of 3 exposed I87.2 Venous insufficiency (chronic) (peripheral) 04/10/2022 No Yes I73.9 Peripheral vascular disease, unspecified 04/10/2022 No Yes I42.0 Dilated cardiomyopathy 04/10/2022 No Yes I35.1 Nonrheumatic aortic (valve) insufficiency 04/10/2022 No Yes I50.30 Unspecified diastolic (congestive) heart failure 04/10/2022 No Yes Inactive Problems Resolved Problems Electronic Signature(s) Signed: 05/15/2022 7:56:19 AM By: Gregory [name] FACS Entered By: Gregory [name] on 05/15/2022 07:56:19

## 2022-05-19 ENCOUNTER — Other Ambulatory Visit: Payer: Self-pay | Admitting: Family Medicine

## 2022-05-21 NOTE — Progress Notes (Signed)
Remote ICD transmission.   

## 2022-05-23 ENCOUNTER — Encounter (HOSPITAL_BASED_OUTPATIENT_CLINIC_OR_DEPARTMENT_OTHER): Payer: Medicare HMO | Admitting: General Surgery

## 2022-05-23 DIAGNOSIS — Z950 Presence of cardiac pacemaker: Secondary | ICD-10-CM | POA: Diagnosis not present

## 2022-05-23 DIAGNOSIS — I351 Nonrheumatic aortic (valve) insufficiency: Secondary | ICD-10-CM | POA: Diagnosis not present

## 2022-05-23 DIAGNOSIS — I2510 Atherosclerotic heart disease of native coronary artery without angina pectoris: Secondary | ICD-10-CM | POA: Diagnosis not present

## 2022-05-23 DIAGNOSIS — I110 Hypertensive heart disease with heart failure: Secondary | ICD-10-CM | POA: Diagnosis not present

## 2022-05-23 DIAGNOSIS — I872 Venous insufficiency (chronic) (peripheral): Secondary | ICD-10-CM | POA: Diagnosis not present

## 2022-05-23 DIAGNOSIS — I739 Peripheral vascular disease, unspecified: Secondary | ICD-10-CM | POA: Diagnosis not present

## 2022-05-23 DIAGNOSIS — I890 Lymphedema, not elsewhere classified: Secondary | ICD-10-CM | POA: Diagnosis not present

## 2022-05-23 DIAGNOSIS — I5032 Chronic diastolic (congestive) heart failure: Secondary | ICD-10-CM | POA: Diagnosis not present

## 2022-05-23 DIAGNOSIS — I420 Dilated cardiomyopathy: Secondary | ICD-10-CM | POA: Diagnosis not present

## 2022-05-23 DIAGNOSIS — L97812 Non-pressure chronic ulcer of other part of right lower leg with fat layer exposed: Secondary | ICD-10-CM | POA: Diagnosis not present

## 2022-05-25 ENCOUNTER — Inpatient Hospital Stay: Payer: Medicare HMO

## 2022-05-25 ENCOUNTER — Other Ambulatory Visit: Payer: Self-pay

## 2022-05-25 DIAGNOSIS — E538 Deficiency of other specified B group vitamins: Secondary | ICD-10-CM | POA: Diagnosis present

## 2022-05-25 DIAGNOSIS — D509 Iron deficiency anemia, unspecified: Secondary | ICD-10-CM | POA: Diagnosis not present

## 2022-05-25 DIAGNOSIS — D539 Nutritional anemia, unspecified: Secondary | ICD-10-CM

## 2022-05-25 DIAGNOSIS — D649 Anemia, unspecified: Secondary | ICD-10-CM

## 2022-05-25 LAB — SAMPLE TO BLOOD BANK

## 2022-05-25 MED ORDER — CYANOCOBALAMIN 1000 MCG/ML IJ SOLN
1000.0000 ug | Freq: Once | INTRAMUSCULAR | Status: AC
  Administered 2022-05-25: 1000 ug via INTRAMUSCULAR
  Filled 2022-05-25: qty 1

## 2022-05-29 ENCOUNTER — Ambulatory Visit (HOSPITAL_BASED_OUTPATIENT_CLINIC_OR_DEPARTMENT_OTHER): Payer: Medicare HMO | Admitting: General Surgery

## 2022-06-01 DIAGNOSIS — E291 Testicular hypofunction: Secondary | ICD-10-CM | POA: Diagnosis not present

## 2022-06-08 ENCOUNTER — Other Ambulatory Visit: Payer: Self-pay

## 2022-06-08 ENCOUNTER — Inpatient Hospital Stay: Payer: Medicare HMO

## 2022-06-08 ENCOUNTER — Inpatient Hospital Stay: Payer: Medicare HMO | Attending: Hematology and Oncology

## 2022-06-08 VITALS — BP 138/77 | HR 70 | Temp 98.1°F | Resp 18

## 2022-06-08 DIAGNOSIS — E291 Testicular hypofunction: Secondary | ICD-10-CM | POA: Diagnosis not present

## 2022-06-08 DIAGNOSIS — E538 Deficiency of other specified B group vitamins: Secondary | ICD-10-CM | POA: Diagnosis not present

## 2022-06-08 DIAGNOSIS — D509 Iron deficiency anemia, unspecified: Secondary | ICD-10-CM | POA: Insufficient documentation

## 2022-06-08 DIAGNOSIS — D539 Nutritional anemia, unspecified: Secondary | ICD-10-CM

## 2022-06-08 DIAGNOSIS — D649 Anemia, unspecified: Secondary | ICD-10-CM

## 2022-06-08 LAB — CBC WITH DIFFERENTIAL (CANCER CENTER ONLY)
Abs Immature Granulocytes: 0.01 10*3/uL (ref 0.00–0.07)
Basophils Absolute: 0 10*3/uL (ref 0.0–0.1)
Basophils Relative: 0 %
Eosinophils Absolute: 0.1 10*3/uL (ref 0.0–0.5)
Eosinophils Relative: 2 %
HCT: 32.9 % — ABNORMAL LOW (ref 39.0–52.0)
Hemoglobin: 10.7 g/dL — ABNORMAL LOW (ref 13.0–17.0)
Immature Granulocytes: 0 %
Lymphocytes Relative: 27 %
Lymphs Abs: 1.3 10*3/uL (ref 0.7–4.0)
MCH: 34.7 pg — ABNORMAL HIGH (ref 26.0–34.0)
MCHC: 32.5 g/dL (ref 30.0–36.0)
MCV: 106.8 fL — ABNORMAL HIGH (ref 80.0–100.0)
Monocytes Absolute: 0.4 10*3/uL (ref 0.1–1.0)
Monocytes Relative: 9 %
Neutro Abs: 2.9 10*3/uL (ref 1.7–7.7)
Neutrophils Relative %: 62 %
Platelet Count: 169 10*3/uL (ref 150–400)
RBC: 3.08 MIL/uL — ABNORMAL LOW (ref 4.22–5.81)
RDW: 15.5 % (ref 11.5–15.5)
WBC Count: 4.7 10*3/uL (ref 4.0–10.5)
nRBC: 0 % (ref 0.0–0.2)

## 2022-06-08 LAB — SAMPLE TO BLOOD BANK

## 2022-06-08 LAB — IRON AND IRON BINDING CAPACITY (CC-WL,HP ONLY)
Iron: 115 ug/dL (ref 45–182)
Saturation Ratios: 33 % (ref 17.9–39.5)
TIBC: 347 ug/dL (ref 250–450)
UIBC: 232 ug/dL (ref 117–376)

## 2022-06-08 LAB — FERRITIN: Ferritin: 60 ng/mL (ref 24–336)

## 2022-06-08 MED ORDER — CYANOCOBALAMIN 1000 MCG/ML IJ SOLN
1000.0000 ug | Freq: Once | INTRAMUSCULAR | Status: AC
  Administered 2022-06-08: 1000 ug via INTRAMUSCULAR
  Filled 2022-06-08: qty 1

## 2022-06-12 NOTE — Progress Notes (Signed)
MILLS, MITTON ([id number]) [id number] Page 1 of 7 Visit Report for 05/23/2022 Arrival Information Details Patient Name: Date of Service: Gregory [name], [place] RD J. 05/23/2022 8:30 A M Medical Record Number: [id number] Patient Account Number: [id number] Date of Birth/Sex: Treating RN: 1950-01-19 (72 y.o. Yates Decamp Primary Care Mittie Knittel: [name] Other Clinician: Referring Lene Mckay: Treating Gilmar Bua/Extender: [name], [name] in Treatment: 6 Visit Information History Since Last Visit All ordered tests and consults were completed: Yes Patient Arrived: [name] Added or deleted any medications: No Arrival Time: 08:16 Any new allergies or adverse reactions: No Accompanied By: self Had a fall or experienced change in No Transfer Assistance: None activities of daily living that may affect Patient Identification Verified: Yes risk of falls: Secondary Verification Process Completed: Yes Signs or symptoms of abuse/neglect since last visito No Patient Requires Transmission-Based Precautions: No Hospitalized since last visit: No Patient Has Alerts: Yes Implantable device outside of the clinic excluding No Patient Alerts: R ABI N/C cellular tissue based products placed in the center since last visit: Has Dressing in Place as Prescribed: Yes Has Compression in Place as Prescribed: Yes Pain Present Now: No Electronic Signature(s) Signed: 06/12/2022 7:50:28 AM By: Brenton Grills Entered By: Brenton Grills on 05/23/2022 08:17:32 -------------------------------------------------------------------------------- Clinic Level of Care Assessment Details Patient Name: Date of Service: Gregory [name], [place] RD J. 05/23/2022 8:30 A M Medical Record Number: [id number] Patient Account Number: [id number] Date of Birth/Sex: Treating RN: 01-Jul-1950 (72 y.o. Marlan Palau Primary Care Tzirel Leonor: [name] Other Clinician: Referring Celenia Hruska: Treating  Destry Bezdek/Extender: [name], [name] in Treatment: 6 Clinic Level of Care Assessment Items TOOL 4 Quantity Score X- 1 0 Use when only an EandM is performed on FOLLOW-UP visit ASSESSMENTS - Nursing Assessment / Reassessment X- 1 10 Reassessment of Co-morbidities (includes updates in patient status) X- 1 5 Reassessment of Adherence to Treatment Plan ASSESSMENTS - Wound and Skin A ssessment / Reassessment X - Simple Wound Assessment / Reassessment - one wound 1 5 []  - 0 Complex Wound Assessment / Reassessment - multiple wounds []  - 0 Dermatologic / Skin Assessment (not related to wound area) ASSESSMENTS - Focused Assessment X- 1 5 Circumferential Edema Measurements - multi extremities []  - 0 Nutritional Assessment / Counseling / Intervention X- 1 5 Lower Extremity Assessment (monofilament, tuning fork, pulses) []  - 0 Peripheral Arterial Disease Assessment (using hand held doppler) ASSESSMENTS - Ostomy and/or Continence Assessment and Care []  - 0 Incontinence Assessment and Management []  - 0 Ostomy Care Assessment and Management (repouching, etc.) HERSEL, MCMEEN ([id number]) [id number] Page 2 of 7 PROCESS - Coordination of Care X - Simple Patient / Family Education for ongoing care 1 15 []  - 0 Complex (extensive) Patient / Family Education for ongoing care X- 1 10 Staff obtains Chiropractor, Records, T Results / Process Orders est []  - 0 Staff telephones HHA, Nursing Homes / Clarify orders / etc []  - 0 Routine Transfer to another Facility (non-emergent condition) []  - 0 Routine Hospital Admission (non-emergent condition) []  - 0 New Admissions / Manufacturing engineer / Ordering NPWT Apligraf, etc. , []  - 0 Emergency Hospital Admission (emergent condition) X- 1 10 Simple Discharge Coordination []  - 0 Complex (extensive) Discharge Coordination PROCESS - Special Needs []  - 0 Pediatric / Minor Patient Management []  -  0 Isolation Patient Management []  - 0 Hearing / Language / Visual special needs []  - 0 Assessment of Community assistance (transportation, D/C planning, etc.) []  - 0 Additional assistance / Altered  mentation []  - 0 Support Surface(s) Assessment (bed, cushion, seat, etc.) INTERVENTIONS - Wound Cleansing / Measurement X - Simple Wound Cleansing - one wound 1 5 []  - 0 Complex Wound Cleansing - multiple wounds X- 1 5 Wound Imaging (photographs - any number of wounds) []  - 0 Wound Tracing (instead of photographs) X- 1 5 Simple Wound Measurement - one wound []  - 0 Complex Wound Measurement - multiple wounds INTERVENTIONS - Wound Dressings X - Small Wound Dressing one or multiple wounds 1 10 []  - 0 Medium Wound Dressing one or multiple wounds []  - 0 Large Wound Dressing one or multiple wounds X- 1 5 Application of Medications - topical []  - 0 Application of Medications - injection INTERVENTIONS - Miscellaneous []  - 0 External ear exam []  - 0 Specimen Collection (cultures, biopsies, blood, body fluids, etc.) []  - 0 Specimen(s) / Culture(s) sent or taken to Lab for analysis []  - 0 Patient Transfer (multiple staff / Nurse, adult / Similar devices) []  - 0 Simple Staple / Suture removal (25 or less) []  - 0 Complex Staple / Suture removal (26 or more) []  - 0 Hypo / Hyperglycemic Management (close monitor of Blood Glucose) []  - 0 Ankle / Brachial Index (ABI) - do not check if billed separately X- 1 5 Vital Signs Has the patient been seen at the hospital within the last three years: Yes Total Score: 100 Level Of Care: New/Established - Level 3 Electronic Signature(s) Signed: 05/23/2022 3:42:40 PM By: Londell Moh ([id number]) [email or web address] Page 3 of 7 Signed: 05/23/2022 3:42:40 PM aylor Entered By: Samuella Bruin on 05/23/2022  08:38:33 -------------------------------------------------------------------------------- Encounter Discharge Information Details Patient Name: Date of Service: Gregory [name], [place] RD J. 05/23/2022 8:30 A M Medical Record Number: [id number] Patient Account Number: [id number] Date of Birth/Sex: Treating RN: [birth year] (72 y.o. Marlan Palau Primary Care Brizza Nathanson: [name] Other Clinician: Referring Coleson Kant: Treating Lorilei Horan/Extender: [name], [name] in Treatment: 6 Encounter Discharge Information Items Discharge Condition: Stable Ambulatory Status: Cane Discharge Destination: Home Transportation: Private Auto Accompanied By: self Schedule Follow-up Appointment: No Clinical Summary of Care: Patient Declined Electronic Signature(s) Signed: 05/23/2022 3:42:40 PM By: Gelene Mink By: Samuella Bruin on 05/23/2022 08:39:01 -------------------------------------------------------------------------------- Lower Extremity Assessment Details Patient Name: Date of Service: Gregory [name], [name] 05/23/2022 8:30 A M Medical Record Number: [id number] Patient Account Number: [id number] Date of Birth/Sex: Treating RN: 02/15/1950 (72 y.o. Yates Decamp Primary Care Luie Laneve: [name] Other Clinician: Referring Clester Chlebowski: Treating Artavis Cowie/Extender: [name], [name] in Treatment: 6 Edema Assessment Assessed: [Left: No] [Right: No] Edema: [Left: Ye] [Right: s] Calf Left: Right: Point of Measurement: From Medial Instep 36 cm Ankle Left: Right: Point of Measurement: From Medial Instep 23 cm Vascular Assessment Pulses: Dorsalis Pedis Palpable: [Right:Yes] Electronic Signature(s) Signed: 06/12/2022 7:50:28 AM By: Brenton Grills Entered By: Brenton Grills on 05/23/2022 08:27:17 -------------------------------------------------------------------------------- Multi Wound Chart Details Patient Name: Date of Service: Gregory [name], [name] 05/23/2022 8:30 A M Medical Record Number: [id number] Patient Account Number: [id number] Date of Birth/Sex: Treating RN: 04-24-1950 (72 y.o. M) Primary Care Daleisa Halperin: [name] Other Clinician: MIKIE, CAVENY ([id number]) [id number] Page 4 of 7 Referring Bubber Rothert: Treating Kem Parcher/Extender: [name], [name] in Treatment: 6 Vital Signs Height(in): 66 Pulse(bpm): 73 Weight(lbs): 225 Blood Pressure(mmHg): 186/78 Body Mass Index(BMI): 36.3 Temperature(F): 98 Respiratory Rate(breaths/min): 18 [1:Photos:] [N/A:N/A] Right, Lateral Lower Leg N/A N/A Wound Location: Trauma N/A N/A Wounding Event: Venous Leg  Ulcer N/A N/A Primary Etiology: Cataracts, Anemia, Arrhythmia, N/A N/A Comorbid History: Coronary Artery Disease, Hypertension, Myocardial Infarction, Peripheral Arterial Disease, Gout, Osteoarthritis, Confinement Anxiety 03/05/2022 N/A N/A Date Acquired: 6 N/A N/A Weeks of Treatment: Open N/A N/A Wound Status: No N/A N/A Wound Recurrence: 0x0x0 N/A N/A Measurements L x W x D (cm) 0 N/A N/A A (cm) : rea 0 N/A N/A Volume (cm) : 100.00% N/A N/A % Reduction in Area: 100.00% N/A N/A % Reduction in Volume: Full Thickness Without Exposed N/A N/A Classification: Support Structures None Present N/A N/A Exudate Amount: Flat and Intact N/A N/A Wound Margin: None Present (0%) N/A N/A Granulation Amount: None Present (0%) N/A N/A Necrotic Amount: Fascia: No N/A N/A Exposed Structures: Fat Layer (Subcutaneous Tissue): No Tendon: No Muscle: No Joint: No Bone: No Large (67-100%) N/A N/A Epithelialization: No Abnormalities Noted N/A N/A Periwound Skin Texture: No Abnormalities Noted N/A N/A Periwound Skin Moisture: Hemosiderin Staining: Yes N/A N/A Periwound Skin Color: No Abnormality N/A N/A Temperature: Treatment Notes Electronic Signature(s) Signed: 05/23/2022 8:36:00 AM By: Duanne Guess MD  FACS Entered By: Duanne Guess on 05/23/2022 08:36:00 -------------------------------------------------------------------------------- Multi-Disciplinary Care Plan Details Patient Name: Date of Service: Gregory [name], [name] 05/23/2022 8:30 A M Medical Record Number: [id number] Patient Account Number: [id number] Date of Birth/Sex: Treating RN: 13-Sep-1950 (72 y.o. Marlan Palau Primary Care Tattianna Schnarr: [name] Other Clinician: Referring Glenisha Gundry: Treating Ajit Errico/Extender: Paulita Cradle Weeks in Treatment: 6 Multidisciplinary Care Plan reviewed with physician RENAY, LANGENBERG ([id number]) [id number] Page 5 of 7 Active Inactive Electronic Signature(s) Signed: 05/23/2022 3:42:40 PM By: Gelene Mink By: Samuella Bruin on 05/23/2022 08:37:56 -------------------------------------------------------------------------------- Pain Assessment Details Patient Name: Date of Service: Gregory [name], [name] 05/23/2022 8:30 A M Medical Record Number: [id number] Patient Account Number: [id number] Date of Birth/Sex: Treating RN: September 04, 1950 (72 y.o. Yates Decamp Primary Care Inessa Wardrop: [name] Other Clinician: Referring Laquisha Northcraft: Treating Nathyn Luiz/Extender: [name], [name] in Treatment: 6 Active Problems Location of Pain Severity and Description of Pain Patient Has Paino No Site Locations Pain Management and Medication Current Pain Management: Electronic Signature(s) Signed: 06/12/2022 7:50:28 AM By: Brenton Grills Entered By: Brenton Grills on 05/23/2022 08:19:30 -------------------------------------------------------------------------------- Patient/Caregiver Education Details Patient Name: Date of Service: Gregory [name], [name] 5/22/2024andnbsp8:30 A M Medical Record Number: [id number] Patient Account Number: [id number] Date of Birth/Gender: Treating RN: November 27, 1950 (72 y.o. Marlan Palau Primary Care Physician: [name] Other Clinician: Referring Physician: Treating Physician/Extender: Paulita Cradle Weeks in Treatment: 6 Education Assessment Education Provided To: Patient Education Topics Provided Venous: Methods: Explain/Verbal Responses: Reinforcements needed, State content correctly HOLSTON, GARTMAN ([id number]) [id number] Page 6 of 7 Electronic Signature(s) Signed: 05/23/2022 3:42:40 PM By: Gelene Mink By: Samuella Bruin on 05/23/2022 08:38:09 -------------------------------------------------------------------------------- Wound Assessment Details Patient Name: Date of Service: Gregory [name], [name] 05/23/2022 8:30 A M Medical Record Number: [id number] Patient Account Number: [id number] Date of Birth/Sex: Treating RN: [birth year] (72 y.o. Yates Decamp Primary Care Adriane Gabbert: [name] Other Clinician: Referring Delaine Hernandez: Treating Shaconda Hajduk/Extender: [name], [name] in Treatment: 6 Wound Status Wound Number: 1 Primary Venous Leg Ulcer Etiology: Wound Location: Right, Lateral Lower Leg Wound Open Wounding Event: Trauma Status: Date Acquired: 03/05/2022 Comorbid Cataracts, Anemia, Arrhythmia, Coronary Artery Disease, Weeks Of Treatment: 6 History: Hypertension, Myocardial Infarction, Peripheral Arterial Disease, Clustered Wound: No Gout, Osteoarthritis, Confinement Anxiety Photos Wound Measurements Length: (cm) Width: (cm) Depth: (cm) Area: (cm) Volume: (cm) 0 % Reduction in  Area: 100% 0 % Reduction in Volume: 100% 0 Epithelialization: Large (67-100%) 0 Tunneling: No 0 Undermining: No Wound Description Classification: Full Thickness Without Exposed Support Structures Wound Margin: Flat and Intact Exudate Amount: None Present Foul Odor After Cleansing: No Slough/Fibrino No Wound Bed Granulation Amount: None Present (0%) Exposed Structure Necrotic  Amount: None Present (0%) Fascia Exposed: No Fat Layer (Subcutaneous Tissue) Exposed: No Tendon Exposed: No Muscle Exposed: No Joint Exposed: No Bone Exposed: No Periwound Skin Texture Texture Color No Abnormalities Noted: Yes No Abnormalities Noted: No Hemosiderin Staining: Yes Moisture No Abnormalities Noted: Yes Temperature / Pain Temperature: No Abnormality Electronic Signature(s) Signed: 05/23/2022 3:42:40 PM By: Samuella Bruin Signed: 06/12/2022 7:50:28 AM By: Brenton Grills Entered By: Samuella Bruin on 05/23/2022 08:33:44 Wynn Banker ([id number]) [id number] Page 7 of 7 -------------------------------------------------------------------------------- Vitals Details Patient Name: Date of Service: Gregory [name], [place] RD J. 05/23/2022 8:30 A M Medical Record Number: [id number] Patient Account Number: [id number] Date of Birth/Sex: Treating RN: 1950-06-04 (72 y.o. Yates Decamp Primary Care Jream Broyles: [name] Other Clinician: Referring Darrel Gloss: Treating Dasani Crear/Extender: [name], [name] in Treatment: 6 Vital Signs Time Taken: 08:17 Temperature (F): 98 Height (in): 66 Pulse (bpm): 73 Weight (lbs): 225 Respiratory Rate (breaths/min): 18 Body Mass Index (BMI): 36.3 Blood Pressure (mmHg): 186/78 Reference Range: 80 - 120 mg / dl Electronic Signature(s) Signed: 06/12/2022 7:50:28 AM By: Brenton Grills Entered By: Brenton Grills on 05/23/2022 78:29:56

## 2022-06-18 DIAGNOSIS — H26493 Other secondary cataract, bilateral: Secondary | ICD-10-CM | POA: Diagnosis not present

## 2022-06-21 ENCOUNTER — Other Ambulatory Visit: Payer: Self-pay | Admitting: *Deleted

## 2022-06-21 ENCOUNTER — Telehealth: Payer: Self-pay | Admitting: Hematology and Oncology

## 2022-06-21 ENCOUNTER — Encounter: Payer: Self-pay | Admitting: Family Medicine

## 2022-06-21 DIAGNOSIS — D539 Nutritional anemia, unspecified: Secondary | ICD-10-CM

## 2022-06-21 NOTE — Telephone Encounter (Signed)
Rescheduled appointment per patients request via incoming call by patients wife. Patients wife Mrs.Cindy is aware of the changes made to the patients upcoming appointments.

## 2022-06-22 ENCOUNTER — Telehealth: Payer: Self-pay | Admitting: *Deleted

## 2022-06-22 ENCOUNTER — Inpatient Hospital Stay: Payer: Medicare HMO

## 2022-06-22 ENCOUNTER — Inpatient Hospital Stay: Payer: Medicare HMO | Admitting: Adult Health

## 2022-06-22 ENCOUNTER — Telehealth: Payer: Self-pay | Admitting: Cardiology

## 2022-06-22 DIAGNOSIS — S81801A Unspecified open wound, right lower leg, initial encounter: Secondary | ICD-10-CM | POA: Diagnosis not present

## 2022-06-22 DIAGNOSIS — M1712 Unilateral primary osteoarthritis, left knee: Secondary | ICD-10-CM | POA: Diagnosis not present

## 2022-06-22 NOTE — Telephone Encounter (Signed)
Patient is returning call. Requesting return call.  

## 2022-06-22 NOTE — Telephone Encounter (Signed)
Pt has been scheduled for tele pre op appt 07/23/22. Med rec and consent are done.    Pt states he is not going to do surgery until at least middle to late August.   I will update all parties involved.

## 2022-06-22 NOTE — Telephone Encounter (Signed)
Wife is calling to get patient pre opp appt moved up. Please advise

## 2022-06-22 NOTE — Telephone Encounter (Signed)
I s/w the pt and his wife. Pt's wife said the tele appt needs to be moved up sooner otherwise pt will not be able to be scheduled for surgery for 08/2022. Pt has tele appt has been changed to  06/29/22. I will update all parties involved.   

## 2022-06-22 NOTE — Telephone Encounter (Signed)
I returned call to pt's wife who just called 6 minutes ago. I got her vm, I left a vm to call back to pre op.

## 2022-06-22 NOTE — Telephone Encounter (Signed)
   Pre-operative Risk Assessment    Patient Name: Gregory [name]  DOB: [birth year] MRN: [id number]      Request for Surgical Clearance    Procedure:   LEFT TOTAL KNEE ARTHROPLASTY  Date of Surgery:  Clearance TBD                                 Surgeon:  DR. Samson Frederic Surgeon's Group or Practice Name:  [facility] Phone number:  [phone] Fax number:  [phone]  [facility]   Type of Clearance Requested:   - Medical  - Pharmacy:  Hold Aspirin NOT INDICATED HOW LONG   Type of Anesthesia:  Spinal   Additional requests/questions:    Wilhemina Cash   06/22/2022, 1:22 PM

## 2022-06-22 NOTE — Telephone Encounter (Signed)
Pt has been scheduled for tele pre op appt 07/23/22. Med rec and consent are done.     Patient Consent for Virtual Visit        Gregory [name] has provided verbal consent on 06/22/2022 for a virtual visit (video or telephone).   CONSENT FOR VIRTUAL VISIT FOR:  Gregory [name]  By participating in this virtual visit I agree to the following:  I hereby voluntarily request, consent and authorize [facility] and its employed or contracted physicians, physician assistants, nurse practitioners or other licensed health care professionals (the Practitioner), to provide me with telemedicine health care services (the "Services") as deemed necessary by the treating Practitioner. I acknowledge and consent to receive the Services by the Practitioner via telemedicine. I understand that the telemedicine visit will involve communicating with the Practitioner through live audiovisual communication technology and the disclosure of certain medical information by electronic transmission. I acknowledge that I have been given the opportunity to request an in-person assessment or other available alternative prior to the telemedicine visit and am voluntarily participating in the telemedicine visit.  I understand that I have the right to withhold or withdraw my consent to the use of telemedicine in the course of my care at any time, without affecting my right to future care or treatment, and that the Practitioner or I may terminate the telemedicine visit at any time. I understand that I have the right to inspect all information obtained and/or recorded in the course of the telemedicine visit and may receive copies of available information for a reasonable fee.  I understand that some of the potential risks of receiving the Services via telemedicine include:  Delay or interruption in medical evaluation due to technological equipment failure or disruption; Information transmitted may not be sufficient (e.g. poor  resolution of images) to allow for appropriate medical decision making by the Practitioner; and/or  In rare instances, security protocols could fail, causing a breach of personal health information.  Furthermore, I acknowledge that it is my responsibility to provide information about my medical history, conditions and care that is complete and accurate to the best of my ability. I acknowledge that Practitioner's advice, recommendations, and/or decision may be based on factors not within their control, such as incomplete or inaccurate data provided by me or distortions of diagnostic images or specimens that may result from electronic transmissions. I understand that the practice of medicine is not an exact science and that Practitioner makes no warranties or guarantees regarding treatment outcomes. I acknowledge that a copy of this consent can be made available to me via my patient portal [facility] MyChart), or I can request a printed copy by calling the office of [facility].    I understand that my insurance will be billed for this visit.   I have read or had this consent read to me. I understand the contents of this consent, which adequately explains the benefits and risks of the Services being provided via telemedicine.  I have been provided ample opportunity to ask questions regarding this consent and the Services and have had my questions answered to my satisfaction. I give my informed consent for the services to be provided through the use of telemedicine in my medical care

## 2022-06-22 NOTE — Telephone Encounter (Signed)
I s/w the pt and his wife. Pt's wife said the tele appt needs to be moved up sooner otherwise pt will not be able to be scheduled for surgery for 08/2022. Pt has tele appt has been changed to  06/29/22. I will update all parties involved.

## 2022-06-22 NOTE — Telephone Encounter (Signed)
   Name: Gregory [name]  DOB: Mar 13, 1950  MRN: [id number]  Primary Cardiologist: Rollene Rotunda, MD   Preoperative team, please contact this patient and set up a phone call appointment for further preoperative risk assessment. Please obtain consent and complete medication review. Thank you for your help.  I confirm that guidance regarding antiplatelet and oral anticoagulation therapy has been completed and, if necessary, noted below.  His aspirin may be held for 5 to 7 days prior to his procedure.  Please resume as soon as hemostasis is achieved.   Ronney Asters, NP 06/22/2022, 1:46 PM [facility]

## 2022-06-24 ENCOUNTER — Other Ambulatory Visit: Payer: Self-pay | Admitting: Nurse Practitioner

## 2022-06-24 DIAGNOSIS — E538 Deficiency of other specified B group vitamins: Secondary | ICD-10-CM

## 2022-06-24 NOTE — Progress Notes (Unsigned)
Patient Care Team: Excell Seltzer, MD as PCP - General (Family Medicine) Rollene Rotunda, MD as PCP - Cardiology (Cardiology) Lanier Prude, MD as PCP - Electrophysiology (Cardiology) Jodelle Gross, NP as Nurse Practitioner (Cardiology) Kathyrn Sheriff, [facility] (Inactive) as Pharmacist (Pharmacist)   CHIEF COMPLAINT: Follow up multifactorial anemia (b12 and iron deficiencies and chronic disease)  CURRENT THERAPY: IV iron PRN, B12 inj q2 weeks, retacrit currently on hold (last IV iron or retacrit in 04/2022)   INTERVAL HISTORY Gregory [name] returns for follow up, I am seeing him while Dr. Pamelia Hoit is out of the office, last seen by him 05/08/2022. Has been receiving B12 q2 weeks and taking oral iron twice a week. Feels well overall, energy is lower in the heat. Denies new bruising/bleeding. Hoping to have knee surgery in October, hgb goal is 11 per Dr. Linna Caprice.  ROS  All other systems reviewed and negative   Past Medical History:  Diagnosis Date   Alcohol abuse    Allergy    Heparin   Anemia    Anxiety    Aortic valve disease    Arthritis    CHF (congestive heart failure) (HCC)    Complication of anesthesia    slow to wake up and disoriented after OHS- 2021 at [facility]   Coronary artery disease    Depression    Glaucoma    Heart murmur    Hypertension    Myocardial infarction [facility])    Peripheral vascular disease (HCC)    Pre-diabetes    Presence of permanent cardiac pacemaker    S/P balloon aortic valvuloplasty 10/18/2020   done for bioprosthetic aortic valve replacement perivalvular leaking reducing it from severe to mild.   Shingles      Past Surgical History:  Procedure Laterality Date   AORTIC ARCH ANGIOGRAPHY N/A 10/10/2020   Procedure: AORTIC ARCH ANGIOGRAPHY;  Surgeon: Yvonne Kendall, MD;  [facility]: [facility] INVASIVE CV LAB;  Service: Cardiovascular;  Laterality: N/A;   AORTIC VALVE REPLACEMENT N/A 03/06/2019   Procedure: AORTIC VALVE REPLACEMENT (AVR), USING  INTUITY ;  Surgeon: Linden Dolin, MD;  Location: [facility] OR;  Service: Open Heart Surgery;  Laterality: N/A;   BALLOON AORTIC VALVE VALVULOPLASTY N/A 10/18/2020   Procedure: BALLOON AORTIC VALVE VALVULOPLASTY;  Surgeon: Tonny Bollman, MD;  Location: [facility] OR;  Service: Cardiovascular;  Laterality: N/A;   BIV PACEMAKER INSERTION CRT-P N/A 10/20/2020   Procedure: BIV PACEMAKER INSERTION CRT-P;  Surgeon: Lanier Prude, MD;  Location: [facility] INVASIVE CV LAB;  Service: Cardiovascular;  Laterality: N/A;   CARDIAC CATHETERIZATION     CARDIAC VALVE REPLACEMENT     Aortic valve replaced   CATARACT EXTRACTION     CORONARY ARTERY BYPASS GRAFT N/A 03/06/2019   Procedure: CORONARY ARTERY BYPASS GRAFTING (CABG), ON PUMP, TIMES THREE, USING BILATERAL INTERNAL MAMMARIES AND LEFT RADIAL ARTERY HARVEST;  Surgeon: Linden Dolin, MD;  [facility]: [facility] OR;  Service: Open Heart Surgery;  Laterality: N/A;  BILATERAL IMA   EYE SURGERY     Cataracts removed   LEFT HEART CATH AND CORONARY ANGIOGRAPHY N/A 03/04/2019   Procedure: LEFT HEART CATH AND CORONARY ANGIOGRAPHY;  Surgeon: Kathleene Hazel, MD;  [facility] INV[facility] CV LAB;  Service: Cardiovascular;  Laterality: N/A;   None     RADIAL ARTERY HARVEST Left 03/06/2019   Procedure: RADIAL ARTERY HARVEST;  Surgeon: Linden Dolin, MD;  [facility] OR;  Service: Open Heart Surgery;  Laterality: Left;   RIGHT HEART  CATH AND CORONARY/GRAFT ANGIOGRAPHY N/A 10/10/2020   Procedure: RIGHT HEART CATH AND CORONARY/GRAFT ANGIOGRAPHY;  Surgeon: Yvonne Kendall, MD;  [facility] INV[facility] CV LAB;  Service: Cardiovascular;  Laterality: N/A;   TEE WITHOUT CARDIOVERSION N/A 03/06/2019   Procedure: TRANSESOPHAGEAL ECHOCARDIOGRAM (TEE);  Surgeon: Linden Dolin, MD;  Location: [facility] OR;  Service: Open Heart Surgery;  Laterality: N/A;   TEE WITHOUT CARDIOVERSION N/A 10/10/2020   Procedure: TRANSESOPHAGEAL ECHOCARDIOGRAM (TEE);  Surgeon: Wendall Stade, MD;  Location:  [facility] ENDOSCOPY;  Service: Cardiovascular;  Laterality: N/A;   TEE WITHOUT CARDIOVERSION N/A 10/18/2020   Procedure: TRANSESOPHAGEAL ECHOCARDIOGRAM (TEE);  Surgeon: Tonny Bollman, MD;  Location: [facility] OR;  Service: Open Heart Surgery;  Laterality: N/A;   TEMPORARY PACEMAKER N/A 10/17/2020   Procedure: TEMPORARY PACEMAKER;  Surgeon: Marinus Maw, MD;  Location: [facility] INVASIVE CV LAB;  Service: Cardiovascular;  Laterality: N/A;     Outpatient Encounter Medications as of 06/26/2022  Medication Sig   acetaminophen (TYLENOL) 500 MG tablet Take 1,000 mg by mouth every 6 (six) hours as needed for moderate pain.   allopurinol (ZYLOPRIM) 100 MG tablet Take 1 tablet by mouth once daily   ALPRAZolam (XANAX) 0.5 MG tablet TAKE 1 TABLET BY MOUTH ONCE DAILY AS  NEEDED  FOR  ANXIETY  TAKE  30  MINUTES  PRIOR  TO  PLANE FLIGHT OR AS NEEDED FOR ANXIETY   aspirin EC 81 MG tablet Take 1 tablet (81 mg total) by mouth daily. Swallow whole.   atorvastatin (LIPITOR) 80 MG tablet Take 1 tablet by mouth once daily   colchicine 0.6 MG tablet Take 2 tablets by mouth once daily   furosemide (LASIX) 20 MG tablet TAKE TWO TABLETS BY MOUTH IN THE MORNING AND TAKE ONE TABLET BY MOUTH IN THE AFTERNOON   JATENZO 237 MG CAPS Take 237 mg by mouth 2 (two) times daily.   ketoconazole (NIZORAL) 2 % shampoo Apply 1 application topically 2 (two) times a week.   latanoprost (XALATAN) 0.005 % ophthalmic solution Place 1 drop into both eyes every morning.    metoprolol succinate (TOPROL-XL) 25 MG 24 hr tablet Take 1 tablet (25 mg total) by mouth 2 (two) times daily.   Misc Natural Products (OSTEO BI-FLEX/5-LOXIN ADVANCED) TABS Take 2 tablets by mouth daily.   potassium chloride SA (KLOR-CON M20) 20 MEQ tablet Take 1 tablet by mouth once daily   sacubitril-valsartan (ENTRESTO) 97-103 MG Take 1 tablet by mouth 2 (two) times daily.   sertraline (ZOLOFT) 50 MG tablet Take 1 tablet by mouth once daily   spironolactone (ALDACTONE) 25 MG tablet  Take 1 tablet by mouth once daily   Testosterone 1.62 % GEL Apply 2 Pump topically daily.   traMADol (ULTRAM) 50 MG tablet TAKE 1 TABLET BY MOUTH EVERY 8 HOURS AS NEEDED FOR PAIN   traZODone (DESYREL) 50 MG tablet Take 0.5-1 tablets (25-50 mg total) by mouth at bedtime as needed for sleep.   No facility-administered encounter medications on file as of 06/26/2022.     Today's Vitals   06/26/22 1152  BP: (!) 122/58  Pulse: 75  Resp: 17  Temp: 98.4 F (36.9 C)  TempSrc: Oral  SpO2: 91%  Weight: 227 lb 9.6 oz (103.2 kg)   Body mass index is 36.74 kg/m.   PHYSICAL EXAM GENERAL: alert, no distress and comfortable SKIN: no rash  EYES: sclera clear LUNGS: clear with normal breathing effort HEART: regular rate & rhythm, mild b/l lower extremity edema compression stockings applied  ABDOMEN: abdomen soft, non-tender and normal bowel sounds NEURO: alert & oriented x 3 with fluent speech   CBC    Component Value Date/Time   WBC 4.7 06/08/2022 0903   WBC 5.1 03/19/2022 0931   RBC 3.08 (L) 06/08/2022 0903   HGB 10.7 (L) 06/08/2022 0903   HGB 9.8 (L) 11/02/2020 1603   HCT 32.9 (L) 06/08/2022 0903   HCT 29.4 (L) 11/02/2020 1603   PLT 169 06/08/2022 0903   PLT 187 11/02/2020 1603   MCV 106.8 (H) 06/08/2022 0903   MCV 92 11/02/2020 1603   MCH 34.7 (H) 06/08/2022 0903   MCHC 32.5 06/08/2022 0903   RDW 15.5 06/08/2022 0903   RDW 18.0 (H) 11/02/2020 1603   LYMPHSABS 1.3 06/08/2022 0903   LYMPHSABS 1.7 10/06/2020 1629   MONOABS 0.4 06/08/2022 0903   EOSABS 0.1 06/08/2022 0903   EOSABS 0.1 10/06/2020 1629   BASOSABS 0.0 06/08/2022 0903   BASOSABS 0.0 10/06/2020 1629     CMP     Component Value Date/Time   NA 140 04/25/2022 0815   NA 141 11/02/2020 1603   K 4.6 04/25/2022 0815   CL 103 04/25/2022 0815   CO2 31 04/25/2022 0815   GLUCOSE 92 04/25/2022 0815   GLUCOSE 92 10/25/2005 1507   BUN 17 04/25/2022 0815   BUN 23 11/02/2020 1603   CREATININE 0.92 04/25/2022 0815    CREATININE 0.79 01/11/2022 0758   CALCIUM 9.0 04/25/2022 0815   PROT 6.2 04/25/2022 0815   PROT 6.6 08/04/2018 1052   ALBUMIN 4.3 04/25/2022 0815   ALBUMIN 4.5 08/04/2018 1052   AST 64 (H) 04/25/2022 0815   AST 50 (H) 01/11/2022 0758   ALT 19 04/25/2022 0815   ALT 19 01/11/2022 0758   ALKPHOS 39 04/25/2022 0815   BILITOT 1.5 (H) 04/25/2022 0815   BILITOT 1.4 (H) 01/11/2022 0758   GFRNONAA >60 03/19/2022 0931   GFRNONAA >60 01/11/2022 0758   GFRAA >60 03/17/2019 0104     ASSESSMENT & PLAN: 72 yo male with   Anemia, Iron and B12 deficiency, and anemia of chronic disease -Work up c/w iron and B12 deficiencies, and elevated LDH with normal direct Coombs test: Concern for intravascular hemolysis possibly from severe valvular heart disease -Hgb trend:   11/17/2020: Hemoglobin 9.6, WBC 3.3, platelets 142, ANC 1.9, ferritin 158, iron saturation 31% (improv from 5%) 01/13/21: Hb 9.9, WBC: 5.5, MCV 105.6, Platelet 165, TB 1.3, Ferritin 50, Iron Sat: 25%,  01/16/2021: Hemoglobin 9.2, MCV 105.1, B12 117 (start B12 injections) 02/13/2021: Hemoglobin 10, MCV 103.1,   06/20/2021: Hemoglobin 10, MCV 100.3  09/12/2021: Hemoglobin 11.1 01/02/2022: Hemoglobin 10.8 (Retacrit injection started) 02/15/2022: Hemoglobin 10.7 (Retacrit changed to every 2 weeks) 03/15/2022: Hemoglobin 9.9, MCV 103.3 03/22/2022: Hemoglobin 9.6, MCV 101.7 04/30/2022: Hemoglobin 11, MCV 101.2 (Retacrit on hold, start B12 weekly x4) 05/08/2022: Hemoglobin 11, MCV 103 06/08/2022: hemoglobin 10.7, MCV 106.8 (changed to B12 q2 weeks) 06/26/2022: Hgb 10.2, MCV 105 (changed to B12 q4 weeks)  Disposition: Mr. Leaf appears stable. Hgb unchanged 10.2 today. He takes oral iron twice weekly, and is tolerating B12 inj q2 weeks which will change to q4 weeks starting today. Continue holding Retacrit. Will follow up the pending B12 and iron panel from today, and arrange IV iron if needed.   Continue lab q2 weeks, B12 q4 weeks, and f/up with Dr.  Pamelia Hoit in 6 weeks   All questions were answered. The patient knows to call the clinic with any problems, questions or  concerns. No barriers to learning were detected.   Gregory Glad, NP-C 06/26/2022

## 2022-06-26 ENCOUNTER — Encounter: Payer: Self-pay | Admitting: Nurse Practitioner

## 2022-06-26 ENCOUNTER — Inpatient Hospital Stay: Payer: Medicare HMO | Admitting: Nurse Practitioner

## 2022-06-26 ENCOUNTER — Inpatient Hospital Stay: Payer: Medicare HMO

## 2022-06-26 VITALS — BP 122/58 | HR 75 | Temp 98.4°F | Resp 17 | Wt 227.6 lb

## 2022-06-26 DIAGNOSIS — E538 Deficiency of other specified B group vitamins: Secondary | ICD-10-CM

## 2022-06-26 DIAGNOSIS — D539 Nutritional anemia, unspecified: Secondary | ICD-10-CM | POA: Diagnosis not present

## 2022-06-26 DIAGNOSIS — D509 Iron deficiency anemia, unspecified: Secondary | ICD-10-CM

## 2022-06-26 LAB — CBC WITH DIFFERENTIAL (CANCER CENTER ONLY)
Abs Immature Granulocytes: 0.01 10*3/uL (ref 0.00–0.07)
Basophils Absolute: 0 10*3/uL (ref 0.0–0.1)
Basophils Relative: 1 %
Eosinophils Absolute: 0.1 10*3/uL (ref 0.0–0.5)
Eosinophils Relative: 1 %
HCT: 29.8 % — ABNORMAL LOW (ref 39.0–52.0)
Hemoglobin: 10.2 g/dL — ABNORMAL LOW (ref 13.0–17.0)
Immature Granulocytes: 0 %
Lymphocytes Relative: 26 %
Lymphs Abs: 1.1 10*3/uL (ref 0.7–4.0)
MCH: 36.2 pg — ABNORMAL HIGH (ref 26.0–34.0)
MCHC: 34.2 g/dL (ref 30.0–36.0)
MCV: 105.7 fL — ABNORMAL HIGH (ref 80.0–100.0)
Monocytes Absolute: 0.4 10*3/uL (ref 0.1–1.0)
Monocytes Relative: 10 %
Neutro Abs: 2.6 10*3/uL (ref 1.7–7.7)
Neutrophils Relative %: 62 %
Platelet Count: 186 10*3/uL (ref 150–400)
RBC: 2.82 MIL/uL — ABNORMAL LOW (ref 4.22–5.81)
RDW: 14.4 % (ref 11.5–15.5)
WBC Count: 4.2 10*3/uL (ref 4.0–10.5)
nRBC: 0 % (ref 0.0–0.2)

## 2022-06-26 LAB — IRON AND IRON BINDING CAPACITY (CC-WL,HP ONLY)
Iron: 146 ug/dL (ref 45–182)
Saturation Ratios: 42 % — ABNORMAL HIGH (ref 17.9–39.5)
TIBC: 344 ug/dL (ref 250–450)
UIBC: 198 ug/dL

## 2022-06-26 LAB — CMP (CANCER CENTER ONLY)
ALT: 26 U/L (ref 0–44)
AST: 58 U/L — ABNORMAL HIGH (ref 15–41)
Albumin: 4.2 g/dL (ref 3.5–5.0)
Alkaline Phosphatase: 39 U/L (ref 38–126)
Anion gap: 6 (ref 5–15)
BUN: 27 mg/dL — ABNORMAL HIGH (ref 8–23)
CO2: 26 mmol/L (ref 22–32)
Calcium: 8.6 mg/dL — ABNORMAL LOW (ref 8.9–10.3)
Chloride: 110 mmol/L (ref 98–111)
Creatinine: 1.32 mg/dL — ABNORMAL HIGH (ref 0.61–1.24)
GFR, Estimated: 57 mL/min — ABNORMAL LOW (ref >60)
Glucose, Bld: 105 mg/dL — ABNORMAL HIGH (ref 70–99)
Potassium: 4.2 mmol/L (ref 3.5–5.1)
Sodium: 142 mmol/L (ref 135–145)
Total Bilirubin: 1.4 mg/dL — ABNORMAL HIGH (ref 0.3–1.2)
Total Protein: 6.6 g/dL (ref 6.5–8.1)

## 2022-06-26 LAB — SAMPLE TO BLOOD BANK

## 2022-06-26 LAB — FERRITIN: Ferritin: 69 ng/mL (ref 24–336)

## 2022-06-26 LAB — VITAMIN B12: Vitamin B-12: 663 pg/mL (ref 180–914)

## 2022-06-26 MED ORDER — CYANOCOBALAMIN 1000 MCG/ML IJ SOLN
1000.0000 ug | Freq: Once | INTRAMUSCULAR | Status: AC
  Administered 2022-06-26: 1000 ug via INTRAMUSCULAR
  Filled 2022-06-26: qty 1

## 2022-06-27 ENCOUNTER — Encounter: Payer: Self-pay | Admitting: Nurse Practitioner

## 2022-06-29 ENCOUNTER — Ambulatory Visit: Payer: Medicare HMO | Attending: Cardiovascular Disease

## 2022-06-29 DIAGNOSIS — Z01810 Encounter for preprocedural cardiovascular examination: Secondary | ICD-10-CM | POA: Diagnosis not present

## 2022-06-29 NOTE — Progress Notes (Signed)
Virtual Visit via Telephone Note   Because of Gregory [name]'s co-morbid illnesses, he is at least at moderate risk for complications without adequate follow up.  This format is felt to be most appropriate for this patient at this time.  The patient did not have access to video technology/had technical difficulties with video requiring transitioning to audio format only (telephone).  All issues noted in this document were discussed and addressed.  No physical exam could be performed with this format.  Please refer to the patient's chart for his consent to telehealth for [facility].  Evaluation Performed:  Preoperative cardiovascular risk assessment _____________   Date:  06/29/2022   Patient ID:  Gregory [name], DOB June 18, 1950, MRN [id number] Patient Location:  Home Provider location:   Office  Primary Care Provider:  Excell Seltzer, MD Primary Cardiologist:  Rollene Rotunda, MD  Chief Complaint / Patient Profile   72 y.o. y/o male with a h/o CAD and aortic valve disease s/p CABG/AVR 03/06/2019, TAA, postop hip, HTN, obesity, chronic combined CHF, AVB s/p PPM, hemolytic anemia, severe bioprosthetic valve dysfunction with valvuloplasty 10/2020 who is pending left total knee arthroplasty and presents today for telephonic preoperative cardiovascular risk assessment.  History of Present Illness    Gregory [name] is a 72 y.o. male who presents via audio/video conferencing for a telehealth visit today.  Pt was last seen in cardiology clinic on 12/01/2021 by Bailey Mech, PA.  At that time ERROLL NORSWORTHY was doing well with no cardiac complaints only the pain.  The patient is now pending procedure as outlined above. Since his last visit, he has been doing well with no new cardiac complaints.  He was recently treated for a wound on his right leg that has completely healed and was released from wound care.   Past Medical History    Past Medical History:  Diagnosis Date    Alcohol abuse    Allergy    Heparin   Anemia    Anxiety    Aortic valve disease    Arthritis    CHF (congestive heart failure) (HCC)    Complication of anesthesia    slow to wake up and disoriented after OHS- 2021 at [facility]   Coronary artery disease    Depression    Glaucoma    Heart murmur    Hypertension    Myocardial infarction [facility])    Peripheral vascular disease (HCC)    Pre-diabetes    Presence of permanent cardiac pacemaker    S/P balloon aortic valvuloplasty 10/18/2020   done for bioprosthetic aortic valve replacement perivalvular leaking reducing it from severe to mild.   Shingles    Past Surgical History:  Procedure Laterality Date   AORTIC ARCH ANGIOGRAPHY N/A 10/10/2020   Procedure: AORTIC ARCH ANGIOGRAPHY;  Surgeon: Yvonne Kendall, MD;  [facility] INVASIVE CV LAB;  Service: Cardiovascular;  Laterality: N/A;   AORTIC VALVE REPLACEMENT N/A 03/06/2019   Procedure: AORTIC VALVE REPLACEMENT (AVR), USING INTUITY ;  Surgeon: Linden Dolin, MD;  Location: [facility] OR;  Service: Open Heart Surgery;  Laterality: N/A;   BALLOON AORTIC VALVE VALVULOPLASTY N/A 10/18/2020   Procedure: BALLOON AORTIC VALVE VALVULOPLASTY;  Surgeon: Tonny Bollman, MD;  Location: [facility] OR;  Service: Cardiovascular;  Laterality: N/A;   BIV PACEMAKER INSERTION CRT-P N/A 10/20/2020   Procedure: BIV PACEMAKER INSERTION CRT-P;  Surgeon: Lanier Prude, MD;  Location: [facility] INVASIVE CV LAB;  Service: Cardiovascular;  Laterality: N/A;   CARDIAC  CATHETERIZATION     CARDIAC VALVE REPLACEMENT     Aortic valve replaced   CATARACT EXTRACTION     CORONARY ARTERY BYPASS GRAFT N/A 03/06/2019   Procedure: CORONARY ARTERY BYPASS GRAFTING (CABG), ON PUMP, TIMES THREE, USING BILATERAL INTERNAL MAMMARIES AND LEFT RADIAL ARTERY HARVEST;  Surgeon: Linden Dolin, MD;  [facility] OR;  Service: Open Heart Surgery;  Laterality: N/A;  BILATERAL IMA   EYE SURGERY     Cataracts removed   LEFT HEART CATH AND  CORONARY ANGIOGRAPHY N/A 03/04/2019   Procedure: LEFT HEART CATH AND CORONARY ANGIOGRAPHY;  Surgeon: Kathleene Hazel, MD;  [facility] INVASIVE CV LAB;  Service: Cardiovascular;  Laterality: N/A;   None     RADIAL ARTERY HARVEST Left 03/06/2019   Procedure: RADIAL ARTERY HARVEST;  Surgeon: Linden Dolin, MD;  [facility] OR;  Service: Open Heart Surgery;  Laterality: Left;   RIGHT HEART CATH AND CORONARY/GRAFT ANGIOGRAPHY N/A 10/10/2020   Procedure: RIGHT HEART CATH AND CORONARY/GRAFT ANGIOGRAPHY;  Surgeon: Yvonne Kendall, MD;  [facility] INVAS[facility] CV LAB;  Service: Cardiovascular;  Laterality: N/A;   TEE WITHOUT CARDIOVERSION N/A 03/06/2019   Procedure: TRANSESOPHAGEAL ECHOCARDIOGRAM (TEE);  Surgeon: Linden Dolin, MD;  Location: [facility] OR;  Service: Open Heart Surgery;  Laterality: N/A;   TEE WITHOUT CARDIOVERSION N/A 10/10/2020   Procedure: TRANSESOPHAGEAL ECHOCARDIOGRAM (TEE);  Surgeon: Wendall Stade, MD;  Location: [facility] ENDOSCOPY;  Service: Cardiovascular;  Laterality: N/A;   TEE WITHOUT CARDIOVERSION N/A 10/18/2020   Procedure: TRANSESOPHAGEAL ECHOCARDIOGRAM (TEE);  Surgeon: Tonny Bollman, MD;  Location: [facility] OR;  Service: Open Heart Surgery;  Laterality: N/A;   TEMPORARY PACEMAKER N/A 10/17/2020   Procedure: TEMPORARY PACEMAKER;  Surgeon: Marinus Maw, MD;  Location: [facility] INVASIVE CV LAB;  Service: Cardiovascular;  Laterality: N/A;    Allergies  Allergies  Allergen Reactions   Heparin     HIT antibody and SRA positive    Home Medications    Prior to Admission medications   Medication Sig Start Date End Date Taking? Authorizing Provider  acetaminophen (TYLENOL) 500 MG tablet Take 1,000 mg by mouth every 6 (six) hours as needed for moderate pain.    [provider]  allopurinol (ZYLOPRIM) 100 MG tablet Take 1 tablet by mouth once daily 11/26/21   Bedsole, Amy E, MD  ALPRAZolam (XANAX) 0.5 MG tablet TAKE 1 TABLET BY MOUTH ONCE DAILY AS  NEEDED  FOR   ANXIETY  TAKE  30  MINUTES  PRIOR  TO  PLANE FLIGHT OR AS NEEDED FOR ANXIETY 11/01/21   Bedsole, Amy E, MD  aspirin EC 81 MG tablet Take 1 tablet (81 mg total) by mouth daily. Swallow whole. 08/06/19   Rollene Rotunda, MD  atorvastatin (LIPITOR) 80 MG tablet Take 1 tablet by mouth once daily 07/04/21   Ermalene Searing, Amy E, MD  colchicine 0.6 MG tablet Take 2 tablets by mouth once daily 05/21/22   Bedsole, Amy E, MD  furosemide (LASIX) 20 MG tablet TAKE TWO TABLETS BY MOUTH IN THE MORNING AND TAKE ONE TABLET BY MOUTH IN THE AFTERNOON 01/31/22   Rollene Rotunda, MD  JATENZO 237 MG CAPS Take 237 mg by mouth 2 (two) times daily. 02/09/22   [provider]  ketoconazole (NIZORAL) 2 % shampoo Apply 1 application topically 2 (two) times a week. 05/09/20   Bedsole, Amy E, MD  latanoprost (XALATAN) 0.005 % ophthalmic solution Place 1 drop into both eyes every morning.  10/25/18   [provider]  metoprolol succinate (TOPROL-XL) 25 MG 24 hr tablet Take 1 tablet (25 mg total) by mouth 2 (two) times daily. 01/05/21   Rollene Rotunda, MD  Misc Natural Products (OSTEO BI-FLEX/5-LOXIN ADVANCED) TABS Take 2 tablets by mouth daily.    [provider]  potassium chloride SA (KLOR-CON M20) 20 MEQ tablet Take 1 tablet by mouth once daily 12/12/21   Rollene Rotunda, MD  sacubitril-valsartan (ENTRESTO) 97-103 MG Take 1 tablet by mouth 2 (two) times daily. 04/21/21   Rollene Rotunda, MD  sertraline (ZOLOFT) 50 MG tablet Take 1 tablet by mouth once daily 05/08/22   Ermalene Searing, Amy E, MD  spironolactone (ALDACTONE) 25 MG tablet Take 1 tablet by mouth once daily 04/05/22   Rollene Rotunda, MD  Testosterone 1.62 % GEL Apply 2 Pump topically daily. 06/11/22   [provider]  traMADol (ULTRAM) 50 MG tablet TAKE 1 TABLET BY MOUTH EVERY 8 HOURS AS NEEDED FOR PAIN 04/27/22   Excell Seltzer, MD  traZODone (DESYREL) 50 MG tablet Take 0.5-1 tablets (25-50 mg total) by mouth at bedtime as needed for sleep. 03/14/22   Excell Seltzer, MD    Physical Exam    Vital Signs:  FYNN REVES does not have vital signs available for review today.  Given telephonic nature of communication, physical exam is limited. AAOx3. NAD. Normal affect.  Speech and respirations are unlabored.  Accessory Clinical Findings    None  Assessment & Plan    1.  Preoperative Cardiovascular Risk Assessment:  Patient is RCRI score is 11%  The patient affirms he has been doing well without any new cardiac symptoms. They are able to achieve 6 METS without cardiac limitations. Therefore, based on ACC/AHA guidelines, the patient would be at acceptable risk for the planned procedure without further cardiovascular testing. The patient was advised that if he develops new symptoms prior to surgery to contact our office to arrange for a follow-up visit, and he verbalized understanding.   The patient was advised that if he develops new symptoms prior to surgery to contact our office to arrange for a follow-up visit, and he verbalized understanding.  Patient can hold ASA 5 to 7 days prior to procedure and should restart postprocedure when surgically safe and hemostasis is achieved.  A copy of this note will be routed to requesting surgeon.  Time:   Today, I have spent 6 minutes with the patient with telehealth technology discussing medical history, symptoms, and management plan.     Napoleon Form, Leodis Rains, NP  06/29/2022, 7:10 AM

## 2022-07-06 ENCOUNTER — Other Ambulatory Visit: Payer: Self-pay | Admitting: Family Medicine

## 2022-07-09 ENCOUNTER — Other Ambulatory Visit: Payer: Self-pay | Admitting: Family Medicine

## 2022-07-09 NOTE — Telephone Encounter (Signed)
Last office visit 05/02/22 for CPE.  Last refilled 04/27/2022 for #90 with no refills.  Next Appt: No future appointments with PCP.

## 2022-07-10 ENCOUNTER — Inpatient Hospital Stay: Payer: Medicare HMO | Attending: Hematology and Oncology

## 2022-07-10 ENCOUNTER — Other Ambulatory Visit: Payer: Self-pay | Admitting: Hematology and Oncology

## 2022-07-10 ENCOUNTER — Other Ambulatory Visit: Payer: Self-pay

## 2022-07-10 DIAGNOSIS — D539 Nutritional anemia, unspecified: Secondary | ICD-10-CM | POA: Diagnosis not present

## 2022-07-10 DIAGNOSIS — E538 Deficiency of other specified B group vitamins: Secondary | ICD-10-CM | POA: Insufficient documentation

## 2022-07-10 DIAGNOSIS — D509 Iron deficiency anemia, unspecified: Secondary | ICD-10-CM

## 2022-07-10 LAB — CBC WITH DIFFERENTIAL (CANCER CENTER ONLY)
Abs Immature Granulocytes: 0.02 10*3/uL (ref 0.00–0.07)
Basophils Absolute: 0 10*3/uL (ref 0.0–0.1)
Basophils Relative: 0 %
Eosinophils Absolute: 0.1 10*3/uL (ref 0.0–0.5)
Eosinophils Relative: 2 %
HCT: 32.5 % — ABNORMAL LOW (ref 39.0–52.0)
Hemoglobin: 10.9 g/dL — ABNORMAL LOW (ref 13.0–17.0)
Immature Granulocytes: 0 %
Lymphocytes Relative: 27 %
Lymphs Abs: 1.3 10*3/uL (ref 0.7–4.0)
MCH: 35.2 pg — ABNORMAL HIGH (ref 26.0–34.0)
MCHC: 33.5 g/dL (ref 30.0–36.0)
MCV: 104.8 fL — ABNORMAL HIGH (ref 80.0–100.0)
Monocytes Absolute: 0.5 10*3/uL (ref 0.1–1.0)
Monocytes Relative: 11 %
Neutro Abs: 2.8 10*3/uL (ref 1.7–7.7)
Neutrophils Relative %: 60 %
Platelet Count: 165 10*3/uL (ref 150–400)
RBC: 3.1 MIL/uL — ABNORMAL LOW (ref 4.22–5.81)
RDW: 13.2 % (ref 11.5–15.5)
WBC Count: 4.7 10*3/uL (ref 4.0–10.5)
nRBC: 0 % (ref 0.0–0.2)

## 2022-07-10 LAB — SAMPLE TO BLOOD BANK

## 2022-07-11 ENCOUNTER — Telehealth: Payer: Self-pay | Admitting: *Deleted

## 2022-07-11 DIAGNOSIS — Z85828 Personal history of other malignant neoplasm of skin: Secondary | ICD-10-CM | POA: Diagnosis not present

## 2022-07-11 DIAGNOSIS — D2262 Melanocytic nevi of left upper limb, including shoulder: Secondary | ICD-10-CM | POA: Diagnosis not present

## 2022-07-11 DIAGNOSIS — D2271 Melanocytic nevi of right lower limb, including hip: Secondary | ICD-10-CM | POA: Diagnosis not present

## 2022-07-11 DIAGNOSIS — D225 Melanocytic nevi of trunk: Secondary | ICD-10-CM | POA: Diagnosis not present

## 2022-07-11 DIAGNOSIS — L570 Actinic keratosis: Secondary | ICD-10-CM | POA: Diagnosis not present

## 2022-07-11 DIAGNOSIS — D2261 Melanocytic nevi of right upper limb, including shoulder: Secondary | ICD-10-CM | POA: Diagnosis not present

## 2022-07-11 NOTE — Telephone Encounter (Signed)
   Name: Gregory [name]  DOB: [birth year]  MRN: [id number]  Primary Cardiologist: Rollene Rotunda, MD  Chart reviewed as part of pre-operative protocol coverage. Because of [name]'s past medical history and time since last visit, he will require a follow-up in-office visit in order to better assess preoperative cardiovascular risk.  Pre-op covering staff: - Please schedule appointment and call patient to inform them. If patient already had an upcoming appointment within acceptable timeframe, please add "pre-op clearance" to the appointment notes so provider is aware. - Please contact requesting surgeon's office via preferred method (i.e, phone, fax) to inform them of need for appointment prior to surgery.  Should be fine to hold ASA x 5 to 7 days prior to procedure as long as patient is asymptomatic at the time of in person visit.  Sharlene Dory, PA-C  07/11/2022, 4:38 PM

## 2022-07-11 NOTE — Telephone Encounter (Signed)
   Pre-operative Risk Assessment    Patient Name: Gregory [name]  DOB: [birth year] MRN: [id number]      Request for Surgical Clearance    Procedure:   LEFT TOTAL KNEE ARTHROPLASTY  Date of Surgery:  Clearance TBD                                 Surgeon:  DR. Samson Frederic Surgeon's Group or Practice Name:  [facility] Phone number:  [phone] ATTN: KERRI MAZE Fax number:  [phone]   Type of Clearance Requested:   - Medical ; ASA    Type of Anesthesia:  Spinal   Additional requests/questions:    Elpidio Anis   07/11/2022, 4:16 PM

## 2022-07-11 NOTE — Telephone Encounter (Signed)
I left a message for the pt for tele pre op appt. In my review of the chart. The pt has just been cleared by [name], NP 06/29/22 for the same surgery. I left message apologizing for my call and that I will d/w the pre op APP tomorrow to confirm that he has been cleared already.

## 2022-07-12 NOTE — Telephone Encounter (Signed)
I called the pt and stated I s/w the pre op APP today [name], PAC. I confirmed with PAC that he has been cleared and there is no need for in office appt. See notes from yesterday. I will re-fax notes from [name], NP providing pre op clearance for the pt per pre op APP.   I apologized to the pt and he has been cleared and I will re-fax notes. Pt thanked me for the call back today.

## 2022-07-13 ENCOUNTER — Other Ambulatory Visit: Payer: Self-pay | Admitting: Cardiology

## 2022-07-19 ENCOUNTER — Ambulatory Visit (INDEPENDENT_AMBULATORY_CARE_PROVIDER_SITE_OTHER): Payer: Medicare HMO

## 2022-07-19 ENCOUNTER — Encounter: Payer: Self-pay | Admitting: Cardiology

## 2022-07-19 DIAGNOSIS — I420 Dilated cardiomyopathy: Secondary | ICD-10-CM

## 2022-07-19 LAB — CUP PACEART REMOTE DEVICE CHECK
Battery Remaining Longevity: 63 mo
Battery Remaining Percentage: 74 %
Battery Voltage: 2.96 V
Brady Statistic AP VP Percent: 89 %
Brady Statistic AP VS Percent: 1 %
Brady Statistic AS VP Percent: 10 %
Brady Statistic AS VS Percent: 1 %
Brady Statistic RA Percent Paced: 89 %
Date Time Interrogation Session: 20240718020014
HighPow Impedance: 63 Ohm
Implantable Lead Connection Status: 753985
Implantable Lead Connection Status: 753985
Implantable Lead Connection Status: 753985
Implantable Lead Implant Date: 20221020
Implantable Lead Implant Date: 20221020
Implantable Lead Implant Date: 20221020
Implantable Lead Location: 753858
Implantable Lead Location: 753859
Implantable Lead Location: 753860
Implantable Pulse Generator Implant Date: 20221020
Lead Channel Impedance Value: 360 Ohm
Lead Channel Impedance Value: 480 Ohm
Lead Channel Impedance Value: 760 Ohm
Lead Channel Pacing Threshold Amplitude: 0.625 V
Lead Channel Pacing Threshold Amplitude: 0.75 V
Lead Channel Pacing Threshold Amplitude: 1.875 V
Lead Channel Pacing Threshold Pulse Width: 0.5 ms
Lead Channel Pacing Threshold Pulse Width: 0.5 ms
Lead Channel Pacing Threshold Pulse Width: 0.7 ms
Lead Channel Sensing Intrinsic Amplitude: 1.9 mV
Lead Channel Sensing Intrinsic Amplitude: 12 mV
Lead Channel Setting Pacing Amplitude: 1.625
Lead Channel Setting Pacing Amplitude: 2.375
Lead Channel Setting Pacing Amplitude: 2.5 V
Lead Channel Setting Pacing Pulse Width: 0.5 ms
Lead Channel Setting Pacing Pulse Width: 0.7 ms
Lead Channel Setting Sensing Sensitivity: 0.5 mV
Pulse Gen Serial Number: 111052020
Zone Setting Status: 755011

## 2022-07-20 DIAGNOSIS — E291 Testicular hypofunction: Secondary | ICD-10-CM | POA: Diagnosis not present

## 2022-07-23 ENCOUNTER — Other Ambulatory Visit: Payer: Self-pay

## 2022-07-23 ENCOUNTER — Telehealth: Payer: Medicare HMO

## 2022-07-23 DIAGNOSIS — D539 Nutritional anemia, unspecified: Secondary | ICD-10-CM

## 2022-07-23 DIAGNOSIS — E538 Deficiency of other specified B group vitamins: Secondary | ICD-10-CM

## 2022-07-24 ENCOUNTER — Inpatient Hospital Stay: Payer: Medicare HMO

## 2022-07-24 ENCOUNTER — Other Ambulatory Visit: Payer: Self-pay

## 2022-07-24 VITALS — BP 136/65 | HR 76 | Temp 98.3°F | Resp 18

## 2022-07-24 DIAGNOSIS — E538 Deficiency of other specified B group vitamins: Secondary | ICD-10-CM | POA: Diagnosis not present

## 2022-07-24 DIAGNOSIS — D539 Nutritional anemia, unspecified: Secondary | ICD-10-CM

## 2022-07-24 LAB — CMP (CANCER CENTER ONLY)
ALT: 23 U/L (ref 0–44)
AST: 58 U/L — ABNORMAL HIGH (ref 15–41)
Albumin: 4.4 g/dL (ref 3.5–5.0)
Alkaline Phosphatase: 36 U/L — ABNORMAL LOW (ref 38–126)
Anion gap: 6 (ref 5–15)
BUN: 17 mg/dL (ref 8–23)
CO2: 28 mmol/L (ref 22–32)
Calcium: 9 mg/dL (ref 8.9–10.3)
Chloride: 106 mmol/L (ref 98–111)
Creatinine: 0.88 mg/dL (ref 0.61–1.24)
GFR, Estimated: >60 mL/min (ref >60)
Glucose, Bld: 76 mg/dL (ref 70–99)
Potassium: 4.5 mmol/L (ref 3.5–5.1)
Sodium: 140 mmol/L (ref 135–145)
Total Bilirubin: 1.8 mg/dL — ABNORMAL HIGH (ref 0.3–1.2)
Total Protein: 6.3 g/dL — ABNORMAL LOW (ref 6.5–8.1)

## 2022-07-24 LAB — CBC WITH DIFFERENTIAL (CANCER CENTER ONLY)
Abs Immature Granulocytes: 0.01 10*3/uL (ref 0.00–0.07)
Basophils Absolute: 0 10*3/uL (ref 0.0–0.1)
Basophils Relative: 0 %
Eosinophils Absolute: 0.1 10*3/uL (ref 0.0–0.5)
Eosinophils Relative: 2 %
HCT: 30.9 % — ABNORMAL LOW (ref 39.0–52.0)
Hemoglobin: 10.5 g/dL — ABNORMAL LOW (ref 13.0–17.0)
Immature Granulocytes: 0 %
Lymphocytes Relative: 25 %
Lymphs Abs: 1.1 10*3/uL (ref 0.7–4.0)
MCH: 35.2 pg — ABNORMAL HIGH (ref 26.0–34.0)
MCHC: 34 g/dL (ref 30.0–36.0)
MCV: 103.7 fL — ABNORMAL HIGH (ref 80.0–100.0)
Monocytes Absolute: 0.4 10*3/uL (ref 0.1–1.0)
Monocytes Relative: 8 %
Neutro Abs: 2.9 10*3/uL (ref 1.7–7.7)
Neutrophils Relative %: 65 %
Platelet Count: 162 10*3/uL (ref 150–400)
RBC: 2.98 MIL/uL — ABNORMAL LOW (ref 4.22–5.81)
RDW: 13.4 % (ref 11.5–15.5)
WBC Count: 4.5 10*3/uL (ref 4.0–10.5)
nRBC: 0 % (ref 0.0–0.2)

## 2022-07-24 LAB — IRON AND IRON BINDING CAPACITY (CC-WL,HP ONLY)
Iron: 128 ug/dL (ref 45–182)
Saturation Ratios: 41 % — ABNORMAL HIGH (ref 17.9–39.5)
TIBC: 314 ug/dL (ref 250–450)
UIBC: 186 ug/dL (ref 117–376)

## 2022-07-24 LAB — FERRITIN: Ferritin: 65 ng/mL (ref 24–336)

## 2022-07-24 LAB — SAMPLE TO BLOOD BANK

## 2022-07-24 LAB — VITAMIN B12: Vitamin B-12: 530 pg/mL (ref 180–914)

## 2022-07-24 MED ORDER — CYANOCOBALAMIN 1000 MCG/ML IJ SOLN
1000.0000 ug | Freq: Once | INTRAMUSCULAR | Status: AC
  Administered 2022-07-24: 1000 ug via INTRAMUSCULAR
  Filled 2022-07-24: qty 1

## 2022-07-26 ENCOUNTER — Ambulatory Visit (INDEPENDENT_AMBULATORY_CARE_PROVIDER_SITE_OTHER): Payer: Medicare HMO | Admitting: Family Medicine

## 2022-07-26 ENCOUNTER — Encounter: Payer: Self-pay | Admitting: Family Medicine

## 2022-07-26 VITALS — BP 128/62 | HR 73 | Temp 97.5°F | Resp 16 | Ht 66.0 in | Wt 223.2 lb

## 2022-07-26 DIAGNOSIS — N289 Disorder of kidney and ureter, unspecified: Secondary | ICD-10-CM | POA: Insufficient documentation

## 2022-07-26 NOTE — Assessment & Plan Note (Signed)
Acute, temporary decline in renal function likely secondary to diuretics and dehydration.  He has pushed fluids some and renal function is back in the normal range on recent labs.

## 2022-07-26 NOTE — Progress Notes (Signed)
Patient ID: Gregory [name], male    DOB: 01-18-1950, 72 y.o.   MRN: [id number]  This visit was conducted in person.  BP 128/62   Pulse 73   Temp (!) 97.5 F (36.4 C)   Resp 16   Ht 5\' 6"  (1.676 m)   Wt 223 lb 4 oz (101.3 kg)   SpO2 98%   BMI 36.03 kg/m    CC:  Chief Complaint  Patient presents with   Abnormal Lab    Kidney function    Subjective:   HPI: Gregory [name] is a 73 y.o. male presenting on 07/26/2022 for Abnormal Lab (Kidney function)  Patient was most recently seen by hematology on 6/25 for labs.  Reviewed notes. Creatinine noted to be worsened from 0.92 at baseline to 1.32. GFR 57 Hematologist recommended increased water and follow-up with PCP.  Of note more recent labs from July 23 showed creatinine of 0.88, GFR greater than 60   No recent antibiotics, no nonsteroidal anti-inflammatories he is on Lasix and spironolactone, using tramadol once daily for pain      Relevant past medical, surgical, family and social history reviewed and updated as indicated. Interim medical history since our last visit reviewed. Allergies and medications reviewed and updated. Outpatient Medications Prior to Visit  Medication Sig Dispense Refill   acetaminophen (TYLENOL) 500 MG tablet Take 1,000 mg by mouth every 6 (six) hours as needed for moderate pain.     allopurinol (ZYLOPRIM) 100 MG tablet Take 1 tablet by mouth once daily 90 tablet 1   ALPRAZolam (XANAX) 0.5 MG tablet TAKE 1 TABLET BY MOUTH ONCE DAILY AS  NEEDED  FOR  ANXIETY  TAKE  30  MINUTES  PRIOR  TO  PLANE FLIGHT OR AS NEEDED FOR ANXIETY 20 tablet 0   aspirin EC 81 MG tablet Take 1 tablet (81 mg total) by mouth daily. Swallow whole. 30 tablet 11   atorvastatin (LIPITOR) 80 MG tablet Take 1 tablet by mouth once daily 90 tablet 3   colchicine 0.6 MG tablet Take 2 tablets by mouth once daily 60 tablet 2   furosemide (LASIX) 20 MG tablet TAKE TWO TABLETS BY MOUTH IN THE MORNING AND TAKE ONE TABLET BY MOUTH IN  THE AFTERNOON 270 tablet 3   ketoconazole (NIZORAL) 2 % shampoo Apply 1 application topically 2 (two) times a week. 120 mL 1   latanoprost (XALATAN) 0.005 % ophthalmic solution Place 1 drop into both eyes every morning.      metoprolol succinate (TOPROL-XL) 25 MG 24 hr tablet Take 1 tablet (25 mg total) by mouth 2 (two) times daily. 180 tablet 3   Misc Natural Products (OSTEO BI-FLEX/5-LOXIN ADVANCED) TABS Take 2 tablets by mouth daily.     potassium chloride SA (KLOR-CON M20) 20 MEQ tablet Take 1 tablet by mouth once daily 90 tablet 3   sacubitril-valsartan (ENTRESTO) 97-103 MG Take 1 tablet by mouth twice daily 180 tablet 0   sertraline (ZOLOFT) 50 MG tablet Take 1 tablet by mouth once daily 90 tablet 3   spironolactone (ALDACTONE) 25 MG tablet Take 1 tablet by mouth once daily 90 tablet 2   Testosterone 1.62 % GEL Apply 2 Pump topically daily.     traMADol (ULTRAM) 50 MG tablet TAKE 1 TABLET BY MOUTH EVERY 8 HOURS AS NEEDED FOR PAIN 90 tablet 0   traZODone (DESYREL) 50 MG tablet Take 0.5-1 tablets (25-50 mg total) by mouth at bedtime as needed for sleep. 90 tablet  1   JATENZO 237 MG CAPS Take 237 mg by mouth 2 (two) times daily. (Patient not taking: Reported on 07/26/2022)     No facility-administered medications prior to visit.     Per HPI unless specifically indicated in ROS section below Review of Systems  Constitutional:  Negative for fatigue and fever.  HENT:  Negative for ear pain.   Eyes:  Negative for pain.  Respiratory:  Negative for cough and shortness of breath.   Cardiovascular:  Negative for chest pain, palpitations and leg swelling.  Gastrointestinal:  Negative for abdominal pain.  Genitourinary:  Negative for dysuria.  Musculoskeletal:  Negative for arthralgias.  Neurological:  Negative for syncope, light-headedness and headaches.  Psychiatric/Behavioral:  Negative for dysphoric mood.    Objective:  BP 128/62   Pulse 73   Temp (!) 97.5 F (36.4 C)   Resp 16   Ht  5\' 6"  (1.676 m)   Wt 223 lb 4 oz (101.3 kg)   SpO2 98%   BMI 36.03 kg/m   Wt Readings from Last 3 Encounters:  07/26/22 223 lb 4 oz (101.3 kg)  06/26/22 227 lb 9.6 oz (103.2 kg)  05/08/22 229 lb 9.6 oz (104.1 kg)      Physical Exam Constitutional:      Appearance: He is well-developed.  HENT:     Head: Normocephalic.     Right Ear: Hearing normal.     Left Ear: Hearing normal.     Nose: Nose normal.  Neck:     Thyroid: No thyroid mass or thyromegaly.     Vascular: No carotid bruit.     Trachea: Trachea normal.  Cardiovascular:     Rate and Rhythm: Normal rate and regular rhythm.     Pulses: Normal pulses.     Heart sounds: Heart sounds not distant. No murmur heard.    No friction rub. No gallop.     Comments: No peripheral edema Pulmonary:     Effort: Pulmonary effort is normal. No respiratory distress.     Breath sounds: Normal breath sounds.  Skin:    General: Skin is warm and dry.     Findings: No rash.  Psychiatric:        Speech: Speech normal.        Behavior: Behavior normal.        Thought Content: Thought content normal.       Results for orders placed or performed in visit on 07/24/22  Vitamin B12  Result Value Ref Range   Vitamin B-12 530 180 - 914 pg/mL  Ferritin  Result Value Ref Range   Ferritin 65 24 - 336 ng/mL  Iron and Iron Binding Capacity ([facility] only)  Result Value Ref Range   Iron 128 45 - 182 ug/dL   TIBC 366 440 - 347 ug/dL   Saturation Ratios 41 (H) 17.9 - 39.5 %   UIBC 186 117 - 376 ug/dL  CMP (Cancer Center only)  Result Value Ref Range   Sodium 140 135 - 145 mmol/L   Potassium 4.5 3.5 - 5.1 mmol/L   Chloride 106 98 - 111 mmol/L   CO2 28 22 - 32 mmol/L   Glucose, Bld 76 70 - 99 mg/dL   BUN 17 8 - 23 mg/dL   Creatinine 4.25 9.56 - 1.24 mg/dL   Calcium 9.0 8.9 - 38.7 mg/dL   Total Protein 6.3 (L) 6.5 - 8.1 g/dL   Albumin 4.4 3.5 - 5.0 g/dL   AST 58 (H)  15 - 41 U/L   ALT 23 0 - 44 U/L   Alkaline Phosphatase 36 (L) 38 -  126 U/L   Total Bilirubin 1.8 (H) 0.3 - 1.2 mg/dL   GFR, Estimated >08 >65 mL/min   Anion gap 6 5 - 15  CBC with Differential (Cancer Center Only)  Result Value Ref Range   WBC Count 4.5 4.0 - 10.5 K/uL   RBC 2.98 (L) 4.22 - 5.81 MIL/uL   Hemoglobin 10.5 (L) 13.0 - 17.0 g/dL   HCT 78.4 (L) 69.6 - 29.5 %   MCV 103.7 (H) 80.0 - 100.0 fL   MCH 35.2 (H) 26.0 - 34.0 pg   MCHC 34.0 30.0 - 36.0 g/dL   RDW 28.4 13.2 - 44.0 %   Platelet Count 162 150 - 400 K/uL   nRBC 0.0 0.0 - 0.2 %   Neutrophils Relative % 65 %   Neutro Abs 2.9 1.7 - 7.7 K/uL   Lymphocytes Relative 25 %   Lymphs Abs 1.1 0.7 - 4.0 K/uL   Monocytes Relative 8 %   Monocytes Absolute 0.4 0.1 - 1.0 K/uL   Eosinophils Relative 2 %   Eosinophils Absolute 0.1 0.0 - 0.5 K/uL   Basophils Relative 0 %   Basophils Absolute 0.0 0.0 - 0.1 K/uL   Immature Granulocytes 0 %   Abs Immature Granulocytes 0.01 0.00 - 0.07 K/uL  Sample to Blood Bank  Result Value Ref Range   Blood Bank Specimen SAMPLE AVAILABLE FOR TESTING    Sample Expiration      07/27/2022,2359 Performed at [facility], [address], [place], [place] [address]     Assessment and Plan  Decreased renal function Assessment & Plan: Acute, temporary decline in renal function likely secondary to diuretics and dehydration.  He has pushed fluids some and renal function is back in the normal range on recent labs.     No follow-ups on file.   [name], MD

## 2022-08-02 NOTE — Progress Notes (Signed)
Remote ICD transmission.   

## 2022-08-06 ENCOUNTER — Other Ambulatory Visit: Payer: Self-pay

## 2022-08-06 DIAGNOSIS — D649 Anemia, unspecified: Secondary | ICD-10-CM

## 2022-08-06 DIAGNOSIS — E538 Deficiency of other specified B group vitamins: Secondary | ICD-10-CM

## 2022-08-06 DIAGNOSIS — D509 Iron deficiency anemia, unspecified: Secondary | ICD-10-CM

## 2022-08-06 DIAGNOSIS — D75829 Heparin-induced thrombocytopenia, unspecified: Secondary | ICD-10-CM

## 2022-08-06 DIAGNOSIS — D539 Nutritional anemia, unspecified: Secondary | ICD-10-CM

## 2022-08-06 NOTE — Progress Notes (Signed)
Patient Care Team: Serena Croissant, MD as PCP - General (Hematology and Oncology) Rollene Rotunda, MD as PCP - Cardiology (Cardiology) Lanier Prude, MD as PCP - Electrophysiology (Cardiology) Jodelle Gross, NP as Nurse Practitioner (Cardiology) Kathyrn Sheriff, [facility] (Inactive) as Pharmacist (Pharmacist)  DIAGNOSIS:  Encounter Diagnosis  Name Primary?   Macrocytic anemia Yes      CHIEF COMPLIANT: Follow-up of  chronic anemia    INTERVAL HISTORY: Gregory [name] is a 72 y.o. with above-mentioned history of heparin-induced thrombocytopenia and chronic macrocytic anemia. He presents to the clinic today for follow-up. Patient reports that he is feeling well. Not too much fatigue.  However he needs to have his knee replacement surgery first week of September and he wants to get his hemoglobin up to be in good shape for that procedure.   ALLERGIES:  is allergic to heparin.  MEDICATIONS:  Current Outpatient Medications  Medication Sig Dispense Refill   acetaminophen (TYLENOL) 500 MG tablet Take 1,000 mg by mouth every 6 (six) hours as needed for moderate pain.     allopurinol (ZYLOPRIM) 100 MG tablet Take 1 tablet by mouth once daily 90 tablet 1   ALPRAZolam (XANAX) 0.5 MG tablet TAKE 1 TABLET BY MOUTH ONCE DAILY AS  NEEDED  FOR  ANXIETY  TAKE  30  MINUTES  PRIOR  TO  PLANE FLIGHT OR AS NEEDED FOR ANXIETY 20 tablet 0   aspirin EC 81 MG tablet Take 1 tablet (81 mg total) by mouth daily. Swallow whole. 30 tablet 11   atorvastatin (LIPITOR) 80 MG tablet Take 1 tablet by mouth once daily 90 tablet 3   colchicine 0.6 MG tablet Take 2 tablets by mouth once daily 60 tablet 2   furosemide (LASIX) 20 MG tablet TAKE TWO TABLETS BY MOUTH IN THE MORNING AND TAKE ONE TABLET BY MOUTH IN THE AFTERNOON 270 tablet 3   JATENZO 237 MG CAPS Take 237 mg by mouth 2 (two) times daily. (Patient not taking: Reported on 07/26/2022)     ketoconazole (NIZORAL) 2 % shampoo Apply 1 application topically  2 (two) times a week. 120 mL 1   latanoprost (XALATAN) 0.005 % ophthalmic solution Place 1 drop into both eyes every morning.      metoprolol succinate (TOPROL-XL) 25 MG 24 hr tablet Take 1 tablet (25 mg total) by mouth 2 (two) times daily. 180 tablet 3   Misc Natural Products (OSTEO BI-FLEX/5-LOXIN ADVANCED) TABS Take 2 tablets by mouth daily.     potassium chloride SA (KLOR-CON M20) 20 MEQ tablet Take 1 tablet by mouth once daily 90 tablet 3   sacubitril-valsartan (ENTRESTO) 97-103 MG Take 1 tablet by mouth twice daily 180 tablet 0   sertraline (ZOLOFT) 50 MG tablet Take 1 tablet by mouth once daily 90 tablet 3   spironolactone (ALDACTONE) 25 MG tablet Take 1 tablet by mouth once daily 90 tablet 2   Testosterone 1.62 % GEL Apply 2 Pump topically daily.     traMADol (ULTRAM) 50 MG tablet TAKE 1 TABLET BY MOUTH EVERY 8 HOURS AS NEEDED FOR PAIN 90 tablet 0   traZODone (DESYREL) 50 MG tablet Take 0.5-1 tablets (25-50 mg total) by mouth at bedtime as needed for sleep. 90 tablet 1   No current facility-administered medications for this visit.    PHYSICAL EXAMINATION: ECOG PERFORMANCE STATUS: 1 - Symptomatic but completely ambulatory  Vitals:   08/07/22 0910  BP: (!) 147/71  Pulse: 79  Resp: 18  Temp: (!)  97.3 F (36.3 C)  SpO2: 95%   Filed Weights   08/07/22 0910  Weight: 229 lb 8 oz (104.1 kg)      LABORATORY DATA:  I have reviewed the data as listed    Latest Ref Rng & Units 08/07/2022    9:05 AM 07/24/2022    8:46 AM 06/26/2022    2:45 PM  CMP  Glucose 70 - 99 mg/dL 99  76  161   BUN 8 - 23 mg/dL 16  17  27    Creatinine 0.61 - 1.24 mg/dL 0.96  0.45  4.09   Sodium 135 - 145 mmol/L 142  140  142   Potassium 3.5 - 5.1 mmol/L 4.0  4.5  4.2   Chloride 98 - 111 mmol/L 106  106  110   CO2 22 - 32 mmol/L 30  28  26    Calcium 8.9 - 10.3 mg/dL 8.7  9.0  8.6   Total Protein 6.5 - 8.1 g/dL 6.2  6.3  6.6   Total Bilirubin 0.3 - 1.2 mg/dL 2.0  1.8  1.4   Alkaline Phos 38 - 126 U/L 39   36  39   AST 15 - 41 U/L 56  58  58   ALT 0 - 44 U/L 22  23  26      Lab Results  Component Value Date   WBC 3.3 (L) 08/07/2022   HGB 10.1 (L) 08/07/2022   HCT 29.8 (L) 08/07/2022   MCV 105.3 (H) 08/07/2022   PLT 177 08/07/2022   NEUTROABS 1.9 08/07/2022    ASSESSMENT & PLAN:  Macrocytic anemia Elevated LDH with normal direct Coombs test: Concern for intravascular hemolysis possibly from severe valvular heart disease   11/17/2020: Hemoglobin 9.6, WBC 3.3, platelets 142, ANC 1.9, ferritin 158, iron saturation 31% (improv from 5%) 01/13/21: Hb 9.9, WBC: 5.5, MCV 105.6, Platelet 165, TB 1.3, Ferritin 50, Iron Sat: 25%,  01/16/2021: Hemoglobin 9.2, MCV 105.1, B12 117 (start B12 injections) 02/13/2021: Hemoglobin 10, MCV 103.1,   06/20/2021: Hemoglobin 10, MCV 100.3  09/12/2021: Hemoglobin 11.1 01/02/2022: Hemoglobin 10.8 (Retacrit injection started) 02/15/2022: Hemoglobin 10.7 (Retacrit changed to every 2 weeks) 03/15/2022: Hemoglobin 9.9, MCV 103.3 03/22/2022: Hemoglobin 9.6, MCV 101.7 04/30/2022: Hemoglobin 11, MCV 101.2 05/08/2022: Hemoglobin 11, MCV 103 08/07/2022: Hemoglobin 10.1, MCV 105.3   Knee surgery is expected to be in September. Therefore I recommend that we resume his Retacrit injections.  B12 injections will be continued as well. Every 3-week injection appointments and every 6 weeks for MD follow-up.    No orders of the defined types were placed in this encounter.  The patient has a good understanding of the overall plan. he agrees with it. he will call with any problems that may develop before the next visit here. Total time spent: 30 mins including face to face time and time spent for planning, charting and co-ordination of care   Tamsen Meek, MD 08/07/22    I Janan Ridge am acting as a Neurosurgeon for [employer]  I have reviewed the above documentation for accuracy and completeness, and I agree with the above.

## 2022-08-07 ENCOUNTER — Inpatient Hospital Stay: Payer: Medicare HMO

## 2022-08-07 ENCOUNTER — Inpatient Hospital Stay: Payer: Medicare HMO | Attending: Hematology and Oncology | Admitting: Hematology and Oncology

## 2022-08-07 ENCOUNTER — Other Ambulatory Visit: Payer: Self-pay

## 2022-08-07 VITALS — BP 147/71 | HR 79 | Temp 97.3°F | Resp 18 | Ht 66.0 in | Wt 229.5 lb

## 2022-08-07 DIAGNOSIS — E611 Iron deficiency: Secondary | ICD-10-CM | POA: Diagnosis not present

## 2022-08-07 DIAGNOSIS — E538 Deficiency of other specified B group vitamins: Secondary | ICD-10-CM | POA: Diagnosis not present

## 2022-08-07 DIAGNOSIS — M1712 Unilateral primary osteoarthritis, left knee: Secondary | ICD-10-CM | POA: Diagnosis present

## 2022-08-07 DIAGNOSIS — D539 Nutritional anemia, unspecified: Secondary | ICD-10-CM

## 2022-08-07 DIAGNOSIS — D509 Iron deficiency anemia, unspecified: Secondary | ICD-10-CM

## 2022-08-07 DIAGNOSIS — D649 Anemia, unspecified: Secondary | ICD-10-CM

## 2022-08-07 DIAGNOSIS — D75829 Heparin-induced thrombocytopenia, unspecified: Secondary | ICD-10-CM

## 2022-08-07 DIAGNOSIS — D638 Anemia in other chronic diseases classified elsewhere: Secondary | ICD-10-CM | POA: Diagnosis present

## 2022-08-07 LAB — CBC WITH DIFFERENTIAL (CANCER CENTER ONLY)
Abs Immature Granulocytes: 0.01 10*3/uL (ref 0.00–0.07)
Basophils Absolute: 0 10*3/uL (ref 0.0–0.1)
Basophils Relative: 0 %
Eosinophils Absolute: 0.1 10*3/uL (ref 0.0–0.5)
Eosinophils Relative: 2 %
HCT: 29.8 % — ABNORMAL LOW (ref 39.0–52.0)
Hemoglobin: 10.1 g/dL — ABNORMAL LOW (ref 13.0–17.0)
Immature Granulocytes: 0 %
Lymphocytes Relative: 30 %
Lymphs Abs: 1 10*3/uL (ref 0.7–4.0)
MCH: 35.7 pg — ABNORMAL HIGH (ref 26.0–34.0)
MCHC: 33.9 g/dL (ref 30.0–36.0)
MCV: 105.3 fL — ABNORMAL HIGH (ref 80.0–100.0)
Monocytes Absolute: 0.3 10*3/uL (ref 0.1–1.0)
Monocytes Relative: 10 %
Neutro Abs: 1.9 10*3/uL (ref 1.7–7.7)
Neutrophils Relative %: 58 %
Platelet Count: 177 10*3/uL (ref 150–400)
RBC: 2.83 MIL/uL — ABNORMAL LOW (ref 4.22–5.81)
RDW: 14.5 % (ref 11.5–15.5)
WBC Count: 3.3 10*3/uL — ABNORMAL LOW (ref 4.0–10.5)
nRBC: 0 % (ref 0.0–0.2)

## 2022-08-07 LAB — CMP (CANCER CENTER ONLY)
ALT: 22 U/L (ref 0–44)
AST: 56 U/L — ABNORMAL HIGH (ref 15–41)
Albumin: 4.2 g/dL (ref 3.5–5.0)
Alkaline Phosphatase: 39 U/L (ref 38–126)
Anion gap: 6 (ref 5–15)
BUN: 16 mg/dL (ref 8–23)
CO2: 30 mmol/L (ref 22–32)
Calcium: 8.7 mg/dL — ABNORMAL LOW (ref 8.9–10.3)
Chloride: 106 mmol/L (ref 98–111)
Creatinine: 0.89 mg/dL (ref 0.61–1.24)
GFR, Estimated: >60 mL/min (ref >60)
Glucose, Bld: 99 mg/dL (ref 70–99)
Potassium: 4 mmol/L (ref 3.5–5.1)
Sodium: 142 mmol/L (ref 135–145)
Total Bilirubin: 2 mg/dL — ABNORMAL HIGH (ref 0.3–1.2)
Total Protein: 6.2 g/dL — ABNORMAL LOW (ref 6.5–8.1)

## 2022-08-07 LAB — IRON AND IRON BINDING CAPACITY (CC-WL,HP ONLY)
Iron: 262 ug/dL — ABNORMAL HIGH (ref 45–182)
Saturation Ratios: 86 % — ABNORMAL HIGH (ref 17.9–39.5)
TIBC: 307 ug/dL (ref 250–450)
UIBC: 45 ug/dL — ABNORMAL LOW (ref 117–376)

## 2022-08-07 LAB — VITAMIN B12: Vitamin B-12: 670 pg/mL (ref 180–914)

## 2022-08-07 LAB — FERRITIN: Ferritin: 41 ng/mL (ref 24–336)

## 2022-08-07 LAB — SAMPLE TO BLOOD BANK

## 2022-08-07 MED ORDER — EPOETIN ALFA 40000 UNIT/ML IJ SOLN
40000.0000 [IU] | Freq: Once | INTRAMUSCULAR | Status: AC
  Administered 2022-08-07: 40000 [IU] via SUBCUTANEOUS
  Filled 2022-08-07: qty 1

## 2022-08-07 NOTE — Assessment & Plan Note (Addendum)
Elevated LDH with normal direct Coombs test: Concern for intravascular hemolysis possibly from severe valvular heart disease   11/17/2020: Hemoglobin 9.6, WBC 3.3, platelets 142, ANC 1.9, ferritin 158, iron saturation 31% (improv from 5%) 01/13/21: Hb 9.9, WBC: 5.5, MCV 105.6, Platelet 165, TB 1.3, Ferritin 50, Iron Sat: 25%,  01/16/2021: Hemoglobin 9.2, MCV 105.1, B12 117 (start B12 injections) 02/13/2021: Hemoglobin 10, MCV 103.1,   06/20/2021: Hemoglobin 10, MCV 100.3  09/12/2021: Hemoglobin 11.1 01/02/2022: Hemoglobin 10.8 (Retacrit injection started) 02/15/2022: Hemoglobin 10.7 (Retacrit changed to every 2 weeks) 03/15/2022: Hemoglobin 9.9, MCV 103.3 03/22/2022: Hemoglobin 9.6, MCV 101.7 04/30/2022: Hemoglobin 11, MCV 101.2 05/08/2022: Hemoglobin 11, MCV 103 08/07/2022: Globin 10.1, MCV 105.3   Knee surgery is expected to be in September. Therefore I recommend that we resume his Retacrit injections.  B12 injections will be continued as well. Every 3-week injection appointments and every 6 weeks for MD follow-up.

## 2022-08-08 ENCOUNTER — Encounter (HOSPITAL_COMMUNITY): Payer: Self-pay | Admitting: Internal Medicine

## 2022-08-08 ENCOUNTER — Ambulatory Visit (HOSPITAL_COMMUNITY)
Admission: RE | Admit: 2022-08-08 | Discharge: 2022-08-08 | Disposition: A | Discharge status: Home / Self Care | Payer: Medicare HMO | Source: Ambulatory Visit | Attending: Internal Medicine | Admitting: Internal Medicine

## 2022-08-08 VITALS — BP 124/80 | HR 74 | Ht 66.0 in | Wt 228.6 lb

## 2022-08-08 DIAGNOSIS — R9431 Abnormal electrocardiogram [ECG] [EKG]: Secondary | ICD-10-CM

## 2022-08-08 DIAGNOSIS — I4891 Unspecified atrial fibrillation: Secondary | ICD-10-CM

## 2022-08-08 DIAGNOSIS — I480 Paroxysmal atrial fibrillation: Secondary | ICD-10-CM | POA: Diagnosis not present

## 2022-08-08 DIAGNOSIS — Z951 Presence of aortocoronary bypass graft: Secondary | ICD-10-CM | POA: Insufficient documentation

## 2022-08-08 DIAGNOSIS — I2510 Atherosclerotic heart disease of native coronary artery without angina pectoris: Secondary | ICD-10-CM | POA: Insufficient documentation

## 2022-08-08 DIAGNOSIS — D6869 Other thrombophilia: Secondary | ICD-10-CM | POA: Insufficient documentation

## 2022-08-08 MED ORDER — APIXABAN 5 MG PO TABS: 5.0000 mg | ORAL_TABLET | Freq: Two times a day (BID) | ORAL | 6 refills | Status: DC

## 2022-08-08 NOTE — Progress Notes (Signed)
Primary Care Physician: Serena Croissant, MD Primary Cardiologist: Rollene Rotunda, MD Electrophysiologist: Lanier Prude, MD     Referring Physician: Dr. Audrie Gallus is a 72 y.o. male with a history of CAD and aortic valve disease s/p CABG/AVR 03/06/19, TAA, HTN, obesity, chronic combined CHF, AVB s/p PPM, hemolytic anemia, severe bioprosthetic valve dysfunction with valvuloplasty on 10/22, and paroxysmal atrial fibrillation who presents for consultation in the [facility]. Device interrogation on 7/18 showed Afib as previously noted back in May 2024. Patient is not on anticoagulation. He has a CHADS2VASC score of 3.  On evaluation today, he is currently in NSR. He takes ASA daily. He could not feel episode of Afib and unsure if he has felt palpitations or SOB. He has upcoming knee surgery in September.   Today, he denies symptoms of palpitations, chest pain, shortness of breath, orthopnea, PND, lower extremity edema, dizziness, presyncope, syncope, snoring, daytime somnolence, bleeding, or neurologic sequela. The patient is tolerating medications without difficulties and is otherwise without complaint today.    he has a BMI of Body mass index is 36.9 kg/m.[name] Filed Weights   08/08/22 0836  Weight: 103.7 kg    Current Outpatient Medications  Medication Sig Dispense Refill   acetaminophen (TYLENOL) 500 MG tablet Take 1,000 mg by mouth every 6 (six) hours as needed for moderate pain.     allopurinol (ZYLOPRIM) 100 MG tablet Take 1 tablet by mouth once daily 90 tablet 1   ALPRAZolam (XANAX) 0.5 MG tablet TAKE 1 TABLET BY MOUTH ONCE DAILY AS  NEEDED  FOR  ANXIETY  TAKE  30  MINUTES  PRIOR  TO  PLANE FLIGHT OR AS NEEDED FOR ANXIETY 20 tablet 0   apixaban (ELIQUIS) 5 MG TABS tablet Take 1 tablet (5 mg total) by mouth 2 (two) times daily. 60 tablet 6   atorvastatin (LIPITOR) 80 MG tablet Take 1 tablet by mouth once daily 90 tablet 3   colchicine  0.6 MG tablet Take 2 tablets by mouth once daily (Patient taking differently: Take 0.6 mg by mouth daily.) 60 tablet 2   furosemide (LASIX) 20 MG tablet TAKE TWO TABLETS BY MOUTH IN THE MORNING AND TAKE ONE TABLET BY MOUTH IN THE AFTERNOON 270 tablet 3   JATENZO 237 MG CAPS Take 237 mg by mouth 2 (two) times daily.     ketoconazole (NIZORAL) 2 % shampoo Apply 1 application topically 2 (two) times a week. (Patient taking differently: Apply 1 application  topically as needed.) 120 mL 1   latanoprost (XALATAN) 0.005 % ophthalmic solution Place 1 drop into both eyes every morning.      metoprolol succinate (TOPROL-XL) 25 MG 24 hr tablet Take 1 tablet (25 mg total) by mouth 2 (two) times daily. 180 tablet 3   Misc Natural Products (OSTEO BI-FLEX/5-LOXIN ADVANCED) TABS Take 2 tablets by mouth daily.     potassium chloride SA (KLOR-CON M20) 20 MEQ tablet Take 1 tablet by mouth once daily 90 tablet 3   sacubitril-valsartan (ENTRESTO) 97-103 MG Take 1 tablet by mouth twice daily 180 tablet 0   sertraline (ZOLOFT) 50 MG tablet Take 1 tablet by mouth once daily 90 tablet 3   spironolactone (ALDACTONE) 25 MG tablet Take 1 tablet by mouth once daily 90 tablet 2   Testosterone 1.62 % GEL Apply 2 Pump topically daily.     traMADol (ULTRAM) 50 MG tablet TAKE 1 TABLET BY MOUTH EVERY 8  HOURS AS NEEDED FOR PAIN 90 tablet 0   traZODone (DESYREL) 50 MG tablet Take 0.5-1 tablets (25-50 mg total) by mouth at bedtime as needed for sleep. 90 tablet 1   No current facility-administered medications for this encounter.    Atrial Fibrillation Management history:  Previous antiarrhythmic drugs: None Previous cardioversions: None Previous ablations: None Anticoagulation history: None   ROS- All systems are reviewed and negative except as per the HPI above.  Physical Exam: BP 124/80   Pulse 74   Ht 5\' 6"  (1.676 m)   Wt 103.7 kg   BMI 36.90 kg/m   GEN: Well nourished, well developed in no acute distress NECK: No  JVD; No carotid bruits CARDIAC: Regular rate and rhythm, no murmurs, rubs, gallops RESPIRATORY:  Clear to auscultation without rales, wheezing or rhonchi  ABDOMEN: Soft, non-tender, non-distended EXTREMITIES:  No edema; No deformity   EKG today demonstrates  Vent. rate 74 BPM PR interval 270 ms QRS duration 112 ms QT/QTcB 398/441 ms P-R-T axes * -43 103 AV dual-paced rhythm with prolonged AV conduction Abnormal ECG When compared with ECG of 21-Oct-2020 05:56, PREVIOUS ECG IS PRESENT  Echo 12/01/21 demonstrated   1. Left ventricular ejection fraction, by estimation, is 60 to 65%. The  left ventricle has normal function. The left ventricle has no regional  wall motion abnormalities. There is mild left ventricular hypertrophy.  Left ventricular diastolic parameters  are consistent with Grade I diastolic dysfunction (impaired relaxation).   2. Right ventricular systolic function is normal. The right ventricular  size is normal.   3. The mitral valve is normal in structure. No evidence of mitral valve  regurgitation. No evidence of mitral stenosis.   4. The aortic valve is normal in structure. Aortic valve regurgitation is  mild. No aortic stenosis is present. Aortic valve mean gradient measures  13.0 mmHg. Aortic valve Vmax measures 2.42 m/s.   5. Aortic dilatation noted. There is moderate dilatation of the ascending  aorta, measuring 45 mm.   6. The inferior vena cava is normal in size with greater than 50%  respiratory variability, suggesting right atrial pressure of 3 mmHg.    ASSESSMENT & PLAN CHA2DS2-VASc Score = 3  The patient's score is based upon: CHF History: 0 HTN History: 1 Diabetes History: 0 Stroke History: 0 Vascular Disease History: 1 Age Score: 1 Gender Score: 0       ASSESSMENT AND PLAN: Paroxysmal Atrial Fibrillation (ICD10:  I48.0) The patient's CHA2DS2-VASc score is 3, indicating a 3.2% annual risk of stroke.    He is currently in NSR.  Education  provided about Afib. Discussion about medication treatments and ablation going forward if indicated. After discussion, we will proceed with conservative observation at this time. Rhythm monitoring device recommended.    Secondary Hypercoagulable State (ICD10:  D68.69) The patient is at significant risk for stroke/thromboembolism based upon his CHA2DS2-VASc Score of 3.  Start Apixaban (Eliquis).  Discussion of risk of stroke due to Afib - after discussion he will start Eliquis 5 mg BID. Confirmation with cardiologist - he will stop ASA.  He will get CBC as part of pre operative lab work for left knee replacement in ~3 weeks.     Follow up December Afib clinic.    Lake Bells, PA-C  Afib Clinic [name] [address] [place], [place] [address] [phone]

## 2022-08-08 NOTE — Patient Instructions (Signed)
Stop Aspirin Start Eliquis 5 mg - Taking 1 tablet by mouth daily

## 2022-08-20 ENCOUNTER — Telehealth: Payer: Self-pay | Admitting: Hematology and Oncology

## 2022-08-20 ENCOUNTER — Telehealth: Payer: Self-pay

## 2022-08-20 NOTE — Telephone Encounter (Signed)
   Pre-operative Risk Assessment    Patient Name: Gregory [name]  DOB: 07-17-1950 MRN: [id number]    Pts last OV was with Lake Bells, PA-C at [facility] on 08/08/22. Pts next appt is with Lake Bells, PA-C at [facility] on 12/05/22.   Request for Surgical Clearance    Procedure:   Left Total Knee Arthroplasty  Date of Surgery:  Clearance 09/06/22                                 Surgeon:  Dr. Loreli Dollar Surgeon's Group or Practice Name:  [facility] Phone number:  [phone] Fax number:  [phone]   Type of Clearance Requested:   - Medical  - Pharmacy:  Hold Apixaban (Eliquis) pt will need instructions on when/if to hold   Type of Anesthesia:  Spinal   Additional requests/questions:    Signed, Zada Finders   08/20/2022, 1:19 PM

## 2022-08-20 NOTE — Telephone Encounter (Signed)
Patient with diagnosis of atrial fibrillation on Eliquis for anticoagulation.    Procedure:   Left Total Knee Arthroplasty   Date of Surgery:  Clearance 09/06/22     CHA2DS2-VASc Score = 3   This indicates a 3.2% annual risk of stroke. The patient's score is based upon: CHF History: 0 HTN History: 1 Diabetes History: 0 Stroke History: 0 Vascular Disease History: 1 Age Score: 1 Gender Score: 0    CrCl 109 Platelet count 177   Per office protocol, patient can hold Eliquis for 3 days prior to procedure.   Patient will not need bridging with Lovenox (enoxaparin) around procedure.  **This guidance is not considered finalized until pre-operative APP has relayed final recommendations.**

## 2022-08-20 NOTE — Telephone Encounter (Signed)
Rescheduled appointments per patient via incoming call. Talked with the patients wife Arline Asp and she is aware of the changes made to the patients upcoming appointments.

## 2022-08-21 ENCOUNTER — Telehealth: Payer: Self-pay | Admitting: *Deleted

## 2022-08-21 ENCOUNTER — Ambulatory Visit: Payer: Self-pay | Admitting: Student

## 2022-08-21 ENCOUNTER — Inpatient Hospital Stay: Payer: Medicare HMO

## 2022-08-21 NOTE — Telephone Encounter (Signed)
   Name: Gregory [name]  DOB: 1950-03-04  MRN: [id number]  Primary Cardiologist: Rollene Rotunda, MD   Preoperative team, please contact this patient and set up a phone call appointment for further preoperative risk assessment. Please obtain consent and complete medication review. Thank you for your help.  I confirm that guidance regarding antiplatelet and oral anticoagulation therapy has been completed and, if necessary, noted below.  Per office protocol, patient can hold Eliquis for 3 days prior to procedure.   Patient will not need bridging with Lovenox (enoxaparin) around procedure.   Ronney Asters, NP 08/21/2022, 8:09 AM [facility]

## 2022-08-21 NOTE — H&P (Signed)
TOTAL KNEE ADMISSION H&P  Patient is being admitted for left total knee arthroplasty.  Subjective:  Chief Complaint:left knee pain.  HPI: Gregory [name], 72 y.o. male, has a history of pain and functional disability in the left knee due to arthritis and has failed non-surgical conservative treatments for greater than 12 weeks to includeNSAID's and/or analgesics, corticosteriod injections, flexibility and strengthening excercises, use of assistive devices, and activity modification.  Onset of symptoms was gradual, starting 10 years ago with rapidlly worsening course since that time. The patient noted no past surgery on the left knee(s).  Patient currently rates pain in the left knee(s) at 10 out of 10 with activity. Patient has night pain, worsening of pain with activity and weight bearing, pain that interferes with activities of daily living, pain with passive range of motion, crepitus, and joint swelling.  Patient has evidence of subchondral cysts, subchondral sclerosis, periarticular osteophytes, and joint space narrowing by imaging studies. There is no active infection.  Patient Active Problem List   Diagnosis Date Noted   Hypercoagulable state due to paroxysmal atrial fibrillation (HCC) 08/08/2022   Decreased renal function 07/26/2022   Cellulitis and abscess of right leg 03/14/2022   Primary osteoarthritis of left knee 02/20/2022   Peripheral vascular disease (HCC) 01/17/2022   S/P placement of cardiac pacemaker 04/20/2021   Vitamin B 12 deficiency 01/16/2021   ICD (implantable cardioverter-defibrillator) in place    S/p aortic valvuloplasty 10/18/2020   Complete heart block (HCC) 10/17/2020   Paravalvular leak (prosthetic valve) 10/10/2020   Severe aortic insufficiency 10/10/2020   Heart block AV complete (HCC)    Iron deficiency anemia 09/22/2020   Chronic gout of multiple sites 03/18/2020   Dilated cardiomyopathy (HCC) 03/13/2020   Dyslipidemia 03/13/2020   Coronary artery  disease involving native coronary artery of native heart without angina pectoris 12/12/2019   Pain due to onychomycosis of toenails of both feet 04/23/2019   S/P CABG x 3 04/15/2019   GAD (generalized anxiety disorder) 03/31/2019   Seborrheic dermatitis 03/31/2019   History of heparin-induced thrombocytopenia 03/23/2019   Long term (current) use of anticoagulants 03/23/2019   Atrial fibrillation (HCC) 03/23/2019   S/P aortic valve replacement 03/06/2019   Prediabetes 11/18/2018   Vitamin D deficiency 09/30/2018   Lymphedema 01/13/2018   Grade I diastolic dysfunction 01/09/2018   Situational anxiety 10/30/2016   Bilateral primary osteoarthritis of knee 09/23/2015   Macrocytic anemia 08/06/2013   Class 1 drug-induced obesity with serious comorbidity and body mass index (BMI) of 34.0 to 34.9 in adult 03/26/2011   Aortic insufficiency 02/15/2011   Essential hypertension 10/09/2006   Past Medical History:  Diagnosis Date   Alcohol abuse    Allergy    Heparin   Anemia    Anxiety    Aortic valve disease    Arthritis    CHF (congestive heart failure) (HCC)    Complication of anesthesia    slow to wake up and disoriented after OHS- 2021 at [facility]   Coronary artery disease    Depression    Glaucoma    Heart murmur    Hypertension    Myocardial infarction [facility])    Peripheral vascular disease (HCC)    Pre-diabetes    Presence of permanent cardiac pacemaker    S/P balloon aortic valvuloplasty 10/18/2020   done for bioprosthetic aortic valve replacement perivalvular leaking reducing it from severe to mild.   Shingles     Past Surgical History:  Procedure Laterality Date   AORTIC  ARCH ANGIOGRAPHY N/A 10/10/2020   Procedure: AORTIC ARCH ANGIOGRAPHY;  Surgeon: Yvonne Kendall, MD;  Location: [facility] INVASIVE CV LAB;  Service: Cardiovascular;  Laterality: N/A;   AORTIC VALVE REPLACEMENT N/A 03/06/2019   Procedure: AORTIC VALVE REPLACEMENT (AVR), USING INTUITY ;  Surgeon: Linden Dolin, MD;  Location: [facility] OR;  Service: Open Heart Surgery;  Laterality: N/A;   BALLOON AORTIC VALVE VALVULOPLASTY N/A 10/18/2020   Procedure: BALLOON AORTIC VALVE VALVULOPLASTY;  Surgeon: Tonny Bollman, MD;  Location: [facility] OR;  Service: Cardiovascular;  Laterality: N/A;   BIV PACEMAKER INSERTION CRT-P N/A 10/20/2020   Procedure: BIV PACEMAKER INSERTION CRT-P;  Surgeon: Lanier Prude, MD;  Location: [facility] INVASIVE CV LAB;  Service: Cardiovascular;  Laterality: N/A;   CARDIAC CATHETERIZATION     CARDIAC VALVE REPLACEMENT     Aortic valve replaced   CATARACT EXTRACTION     CORONARY ARTERY BYPASS GRAFT N/A 03/06/2019   Procedure: CORONARY ARTERY BYPASS GRAFTING (CABG), ON PUMP, TIMES THREE, USING BILATERAL INTERNAL MAMMARIES AND LEFT RADIAL ARTERY HARVEST;  Surgeon: Linden Dolin, MD;  Location: [facility] OR;  Service: Open Heart Surgery;  Laterality: N/A;  BILATERAL IMA   EYE SURGERY     Cataracts removed   LEFT HEART CATH AND CORONARY ANGIOGRAPHY N/A 03/04/2019   Procedure: LEFT HEART CATH AND CORONARY ANGIOGRAPHY;  Surgeon: Kathleene Hazel, MD;  Location: [facility] INVASIVE CV LAB;  Service: Cardiovascular;  Laterality: N/A;   None     RADIAL ARTERY HARVEST Left 03/06/2019   Procedure: RADIAL ARTERY HARVEST;  Surgeon: Linden Dolin, MD;  Location: [facility] OR;  Service: Open Heart Surgery;  Laterality: Left;   RIGHT HEART CATH AND CORONARY/GRAFT ANGIOGRAPHY N/A 10/10/2020   Procedure: RIGHT HEART CATH AND CORONARY/GRAFT ANGIOGRAPHY;  Surgeon: Yvonne Kendall, MD;  Location: [facility] INVASIVE CV LAB;  Service: Cardiovascular;  Laterality: N/A;   TEE WITHOUT CARDIOVERSION N/A 03/06/2019   Procedure: TRANSESOPHAGEAL ECHOCARDIOGRAM (TEE);  Surgeon: Linden Dolin, MD;  Location: [facility] OR;  Service: Open Heart Surgery;  Laterality: N/A;   TEE WITHOUT CARDIOVERSION N/A 10/10/2020   Procedure: TRANSESOPHAGEAL ECHOCARDIOGRAM (TEE);  Surgeon: Wendall Stade, MD;  Location: [facility] ENDOSCOPY;  Service: Cardiovascular;   Laterality: N/A;   TEE WITHOUT CARDIOVERSION N/A 10/18/2020   Procedure: TRANSESOPHAGEAL ECHOCARDIOGRAM (TEE);  Surgeon: Tonny Bollman, MD;  Location: [facility] OR;  Service: Open Heart Surgery;  Laterality: N/A;   TEMPORARY PACEMAKER N/A 10/17/2020   Procedure: TEMPORARY PACEMAKER;  Surgeon: Marinus Maw, MD;  Location: [facility] INVASIVE CV LAB;  Service: Cardiovascular;  Laterality: N/A;    Current Outpatient Medications  Medication Sig Dispense Refill Last Dose   acetaminophen (TYLENOL) 500 MG tablet Take 1,000 mg by mouth every 6 (six) hours as needed for moderate pain.      allopurinol (ZYLOPRIM) 100 MG tablet Take 1 tablet by mouth once daily 90 tablet 1    ALPRAZolam (XANAX) 0.5 MG tablet TAKE 1 TABLET BY MOUTH ONCE DAILY AS  NEEDED  FOR  ANXIETY  TAKE  30  MINUTES  PRIOR  TO  PLANE FLIGHT OR AS NEEDED FOR ANXIETY 20 tablet 0    apixaban (ELIQUIS) 5 MG TABS tablet Take 1 tablet (5 mg total) by mouth 2 (two) times daily. 60 tablet 6    atorvastatin (LIPITOR) 80 MG tablet Take 1 tablet by mouth once daily 90 tablet 3    colchicine 0.6 MG tablet Take 2 tablets by mouth once daily (Patient taking differently: Take 0.6 mg by mouth  daily.) 60 tablet 2    furosemide (LASIX) 20 MG tablet TAKE TWO TABLETS BY MOUTH IN THE MORNING AND TAKE ONE TABLET BY MOUTH IN THE AFTERNOON 270 tablet 3    JATENZO 237 MG CAPS Take 237 mg by mouth 2 (two) times daily.      ketoconazole (NIZORAL) 2 % shampoo Apply 1 application topically 2 (two) times a week. (Patient taking differently: Apply 1 application  topically daily as needed for irritation.) 120 mL 1    latanoprost (XALATAN) 0.005 % ophthalmic solution Place 1 drop into both eyes every morning.       metoprolol succinate (TOPROL-XL) 25 MG 24 hr tablet Take 1 tablet (25 mg total) by mouth 2 (two) times daily. 180 tablet 3    Misc Natural Products (OSTEO BI-FLEX/5-LOXIN ADVANCED) TABS Take 2 tablets by mouth daily.      potassium chloride SA (KLOR-CON M20) 20 MEQ  tablet Take 1 tablet by mouth once daily 90 tablet 3    sacubitril-valsartan (ENTRESTO) 97-103 MG Take 1 tablet by mouth twice daily 180 tablet 0    sertraline (ZOLOFT) 50 MG tablet Take 1 tablet by mouth once daily 90 tablet 3    spironolactone (ALDACTONE) 25 MG tablet Take 1 tablet by mouth once daily 90 tablet 2    Testosterone 1.62 % GEL Apply 2 Pump topically daily. (Patient not taking: Reported on 08/30/2022)      traMADol (ULTRAM) 50 MG tablet TAKE 1 TABLET BY MOUTH EVERY 8 HOURS AS NEEDED FOR PAIN 90 tablet 0    traZODone (DESYREL) 50 MG tablet Take 0.5-1 tablets (25-50 mg total) by mouth at bedtime as needed for sleep. 90 tablet 1    No current facility-administered medications for this visit.   Allergies  Allergen Reactions   Heparin     HIT antibody and SRA positive    Social History   Tobacco Use   Smoking status: Never   Smokeless tobacco: Never  Substance Use Topics   Alcohol use: Yes    Alcohol/week: 7.0 standard drinks of alcohol    Types: 7 Cans of beer per week    Family History  Adopted: Yes  Problem Relation Age of Onset   Cancer Mother        breast cancer   Alcohol abuse Father    Breast cancer Sister    Cancer Sister      Review of Systems  Musculoskeletal:  Positive for arthralgias, gait problem and joint swelling.  All other systems reviewed and are negative.   Objective:  Physical Exam Constitutional:      Appearance: Normal appearance.  HENT:     Head: Normocephalic and atraumatic.     Nose: Nose normal.     Mouth/Throat:     Mouth: Mucous membranes are moist.     Pharynx: Oropharynx is clear.  Eyes:     Conjunctiva/sclera: Conjunctivae normal.  Cardiovascular:     Rate and Rhythm: Normal rate.     Pulses: Normal pulses.     Heart sounds: Normal heart sounds.  Pulmonary:     Effort: Pulmonary effort is normal.     Breath sounds: Normal breath sounds.  Abdominal:     General: Abdomen is flat.     Palpations: Abdomen is soft.   Genitourinary:    Comments: deferred Musculoskeletal:     Cervical back: Normal range of motion and neck supple.     Comments: Examination of left knee reveals scratch on lateral aspect of  his knee from reported cat scratch. He is got some swelling, trace effusion. No warmth or erythema. Varus deformity. He has tenderness to palpation medial joint line, lateral joint line, peripatellar retinacular tissues bilaterally. Range of motion is 15 to 110 degrees without any instability bilaterally. Painless range of motion of the hips.  Distally, there is no focal motor or sensory deficit. On the right side, he has a palpable pedal pulse. On the left side, his foot is warm and well-perfused, but I am unable to easily palpate pedal pulses. Venous stasis changes noted in lower extremities bilaterally.   Skin:    General: Skin is warm and dry.     Capillary Refill: Capillary refill takes less than 2 seconds.  Neurological:     General: No focal deficit present.     Mental Status: He is alert and oriented to person, place, and time.  Psychiatric:        Mood and Affect: Mood normal.        Behavior: Behavior normal.        Thought Content: Thought content normal.        Judgment: Judgment normal.     Vital signs in last 24 hours: @VSRANGES @  Labs:   Estimated body mass index is 35.83 kg/m as calculated from the following:   Height as of 08/30/22: 5\' 6"  (1.676 m).   Weight as of 08/30/22: 100.7 kg.   Imaging Review Plain radiographs demonstrate severe degenerative joint disease of the left knee(s). The overall alignment issignificant varus. The bone quality appears to be adequate for age and reported activity level.      Assessment/Plan:  End stage arthritis, left knee   The patient history, physical examination, clinical judgment of the provider and imaging studies are consistent with end stage degenerative joint disease of the left knee(s) and total knee arthroplasty is deemed  medically necessary. The treatment options including medical management, injection therapy arthroscopy and arthroplasty were discussed at length. The risks and benefits of total knee arthroplasty were presented and reviewed. The risks due to aseptic loosening, infection, stiffness, patella tracking problems, thromboembolic complications and other imponderables were discussed. The patient acknowledged the explanation, agreed to proceed with the plan and consent was signed. Patient is being admitted for inpatient treatment for surgery, pain control, PT, OT, prophylactic antibiotics, VTE prophylaxis, progressive ambulation and ADL's and discharge planning. The patient is planning to be discharged home with OPPT after an overnight stay.  Therapy Plans: outpatient therapy. At [facility]. 1st PT eval 09/10/22.  Disposition: Home with wife Planned DVT Prophylaxis: Eliquis 5 mg BID DME needed: walker. Has iceman ice machine.  PCP: Cleared. Cardiology: Cleared Vascular: Cleared.  TXA: IV Allergies:  - Heparin - HIT Anesthesia Concerns: None.  BMI: 35.7 Last HgbA1c: Not diabetic Other: - Afib - Eliquis. Stop 3 days prior to surgery.  - PAD.  - Cat scratch left knee lateral aspect. Discussed has to be healed prior to surgery.  - Chronic anemia. Last Hgb 10.1 - Tramadol once daily baseline.  - CAD sp CABG, with pacemaker/defibrillator  - Oxycodone, zofran.  - 08/07/22: Hgb 10.1, Restarted on retacrit injections. Discussed hemoglobin has to be high enough for surgery pending [facility] labs.  - 08/29/22: Hgb 10.9, Cr. 1.05, K+4.6.     Patient's anticipated LOS is less than 2 midnights, meeting these requirements: - Younger than 52 - Lives within 1 hour of care - Has a competent adult at home to recover with post-op recover -  NO history of  - Chronic pain requiring opiods  - Diabetes  - Coronary Artery Disease  - Heart failure  - Heart attack  - Stroke  - DVT/VTE  - Cardiac arrhythmia  -  Respiratory Failure/COPD  - Renal failure  - Anemia  - Advanced Liver disease

## 2022-08-21 NOTE — Telephone Encounter (Signed)
Pt has been scheduled for tele pre op appt 08/31/22 @ 9:40. Med rec and consent are done.

## 2022-08-21 NOTE — H&P (View-Only) (Signed)
TOTAL KNEE ADMISSION H&P  Patient is being admitted for left total knee arthroplasty.  Subjective:  Chief Complaint:left knee pain.  HPI: Gregory [name], 72 y.o. male, has a history of pain and functional disability in the left knee due to arthritis and has failed non-surgical conservative treatments for greater than 12 weeks to includeNSAID's and/or analgesics, corticosteriod injections, flexibility and strengthening excercises, use of assistive devices, and activity modification.  Onset of symptoms was gradual, starting 10 years ago with rapidlly worsening course since that time. The patient noted no past surgery on the left knee(s).  Patient currently rates pain in the left knee(s) at 10 out of 10 with activity. Patient has night pain, worsening of pain with activity and weight bearing, pain that interferes with activities of daily living, pain with passive range of motion, crepitus, and joint swelling.  Patient has evidence of subchondral cysts, subchondral sclerosis, periarticular osteophytes, and joint space narrowing by imaging studies. There is no active infection.  Patient Active Problem List   Diagnosis Date Noted   Hypercoagulable state due to paroxysmal atrial fibrillation (HCC) 08/08/2022   Decreased renal function 07/26/2022   Cellulitis and abscess of right leg 03/14/2022   Primary osteoarthritis of left knee 02/20/2022   Peripheral vascular disease (HCC) 01/17/2022   S/P placement of cardiac pacemaker 04/20/2021   Vitamin B 12 deficiency 01/16/2021   ICD (implantable cardioverter-defibrillator) in place    S/p aortic valvuloplasty 10/18/2020   Complete heart block (HCC) 10/17/2020   Paravalvular leak (prosthetic valve) 10/10/2020   Severe aortic insufficiency 10/10/2020   Heart block AV complete (HCC)    Iron deficiency anemia 09/22/2020   Chronic gout of multiple sites 03/18/2020   Dilated cardiomyopathy (HCC) 03/13/2020   Dyslipidemia 03/13/2020   Coronary artery  disease involving native coronary artery of native heart without angina pectoris 12/12/2019   Pain due to onychomycosis of toenails of both feet 04/23/2019   S/P CABG x 3 04/15/2019   GAD (generalized anxiety disorder) 03/31/2019   Seborrheic dermatitis 03/31/2019   History of heparin-induced thrombocytopenia 03/23/2019   Long term (current) use of anticoagulants 03/23/2019   Atrial fibrillation (HCC) 03/23/2019   S/P aortic valve replacement 03/06/2019   Prediabetes 11/18/2018   Vitamin D deficiency 09/30/2018   Lymphedema 01/13/2018   Grade I diastolic dysfunction 01/09/2018   Situational anxiety 10/30/2016   Bilateral primary osteoarthritis of knee 09/23/2015   Macrocytic anemia 08/06/2013   Class 1 drug-induced obesity with serious comorbidity and body mass index (BMI) of 34.0 to 34.9 in adult 03/26/2011   Aortic insufficiency 02/15/2011   Essential hypertension 10/09/2006   Past Medical History:  Diagnosis Date   Alcohol abuse    Allergy    Heparin   Anemia    Anxiety    Aortic valve disease    Arthritis    CHF (congestive heart failure) (HCC)    Complication of anesthesia    slow to wake up and disoriented after OHS- 2021 at [facility]   Coronary artery disease    Depression    Glaucoma    Heart murmur    Hypertension    Myocardial infarction [facility])    Peripheral vascular disease (HCC)    Pre-diabetes    Presence of permanent cardiac pacemaker    S/P balloon aortic valvuloplasty 10/18/2020   done for bioprosthetic aortic valve replacement perivalvular leaking reducing it from severe to mild.   Shingles     Past Surgical History:  Procedure Laterality Date   AORTIC  ARCH ANGIOGRAPHY N/A 10/10/2020   Procedure: AORTIC ARCH ANGIOGRAPHY;  Surgeon: Yvonne Kendall, MD;  Location: [facility] INVASIVE CV LAB;  Service: Cardiovascular;  Laterality: N/A;   AORTIC VALVE REPLACEMENT N/A 03/06/2019   Procedure: AORTIC VALVE REPLACEMENT (AVR), USING INTUITY ;  Surgeon: Linden Dolin, MD;  Location: [facility] OR;  Service: Open Heart Surgery;  Laterality: N/A;   BALLOON AORTIC VALVE VALVULOPLASTY N/A 10/18/2020   Procedure: BALLOON AORTIC VALVE VALVULOPLASTY;  Surgeon: Tonny Bollman, MD;  Location: [facility] OR;  Service: Cardiovascular;  Laterality: N/A;   BIV PACEMAKER INSERTION CRT-P N/A 10/20/2020   Procedure: BIV PACEMAKER INSERTION CRT-P;  Surgeon: Lanier Prude, MD;  Location: [facility] INVASIVE CV LAB;  Service: Cardiovascular;  Laterality: N/A;   CARDIAC CATHETERIZATION     CARDIAC VALVE REPLACEMENT     Aortic valve replaced   CATARACT EXTRACTION     CORONARY ARTERY BYPASS GRAFT N/A 03/06/2019   Procedure: CORONARY ARTERY BYPASS GRAFTING (CABG), ON PUMP, TIMES THREE, USING BILATERAL INTERNAL MAMMARIES AND LEFT RADIAL ARTERY HARVEST;  Surgeon: Linden Dolin, MD;  Location: [facility] OR;  Service: Open Heart Surgery;  Laterality: N/A;  BILATERAL IMA   EYE SURGERY     Cataracts removed   LEFT HEART CATH AND CORONARY ANGIOGRAPHY N/A 03/04/2019   Procedure: LEFT HEART CATH AND CORONARY ANGIOGRAPHY;  Surgeon: Kathleene Hazel, MD;  Location: [facility] INVASIVE CV LAB;  Service: Cardiovascular;  Laterality: N/A;   None     RADIAL ARTERY HARVEST Left 03/06/2019   Procedure: RADIAL ARTERY HARVEST;  Surgeon: Linden Dolin, MD;  Location: [facility] OR;  Service: Open Heart Surgery;  Laterality: Left;   RIGHT HEART CATH AND CORONARY/GRAFT ANGIOGRAPHY N/A 10/10/2020   Procedure: RIGHT HEART CATH AND CORONARY/GRAFT ANGIOGRAPHY;  Surgeon: Yvonne Kendall, MD;  Location: [facility] INVASIVE CV LAB;  Service: Cardiovascular;  Laterality: N/A;   TEE WITHOUT CARDIOVERSION N/A 03/06/2019   Procedure: TRANSESOPHAGEAL ECHOCARDIOGRAM (TEE);  Surgeon: Linden Dolin, MD;  Location: [facility] OR;  Service: Open Heart Surgery;  Laterality: N/A;   TEE WITHOUT CARDIOVERSION N/A 10/10/2020   Procedure: TRANSESOPHAGEAL ECHOCARDIOGRAM (TEE);  Surgeon: Wendall Stade, MD;  Location: [facility] ENDOSCOPY;  Service: Cardiovascular;   Laterality: N/A;   TEE WITHOUT CARDIOVERSION N/A 10/18/2020   Procedure: TRANSESOPHAGEAL ECHOCARDIOGRAM (TEE);  Surgeon: Tonny Bollman, MD;  Location: [facility] OR;  Service: Open Heart Surgery;  Laterality: N/A;   TEMPORARY PACEMAKER N/A 10/17/2020   Procedure: TEMPORARY PACEMAKER;  Surgeon: Marinus Maw, MD;  Location: [facility] INVASIVE CV LAB;  Service: Cardiovascular;  Laterality: N/A;    Current Outpatient Medications  Medication Sig Dispense Refill Last Dose   acetaminophen (TYLENOL) 500 MG tablet Take 1,000 mg by mouth every 6 (six) hours as needed for moderate pain.      allopurinol (ZYLOPRIM) 100 MG tablet Take 1 tablet by mouth once daily 90 tablet 1    ALPRAZolam (XANAX) 0.5 MG tablet TAKE 1 TABLET BY MOUTH ONCE DAILY AS  NEEDED  FOR  ANXIETY  TAKE  30  MINUTES  PRIOR  TO  PLANE FLIGHT OR AS NEEDED FOR ANXIETY 20 tablet 0    apixaban (ELIQUIS) 5 MG TABS tablet Take 1 tablet (5 mg total) by mouth 2 (two) times daily. 60 tablet 6    atorvastatin (LIPITOR) 80 MG tablet Take 1 tablet by mouth once daily 90 tablet 3    colchicine 0.6 MG tablet Take 2 tablets by mouth once daily (Patient taking differently: Take 0.6 mg by mouth  daily.) 60 tablet 2    furosemide (LASIX) 20 MG tablet TAKE TWO TABLETS BY MOUTH IN THE MORNING AND TAKE ONE TABLET BY MOUTH IN THE AFTERNOON 270 tablet 3    JATENZO 237 MG CAPS Take 237 mg by mouth 2 (two) times daily.      ketoconazole (NIZORAL) 2 % shampoo Apply 1 application topically 2 (two) times a week. (Patient taking differently: Apply 1 application  topically daily as needed for irritation.) 120 mL 1    latanoprost (XALATAN) 0.005 % ophthalmic solution Place 1 drop into both eyes every morning.       metoprolol succinate (TOPROL-XL) 25 MG 24 hr tablet Take 1 tablet (25 mg total) by mouth 2 (two) times daily. 180 tablet 3    Misc Natural Products (OSTEO BI-FLEX/5-LOXIN ADVANCED) TABS Take 2 tablets by mouth daily.      potassium chloride SA (KLOR-CON M20) 20 MEQ  tablet Take 1 tablet by mouth once daily 90 tablet 3    sacubitril-valsartan (ENTRESTO) 97-103 MG Take 1 tablet by mouth twice daily 180 tablet 0    sertraline (ZOLOFT) 50 MG tablet Take 1 tablet by mouth once daily 90 tablet 3    spironolactone (ALDACTONE) 25 MG tablet Take 1 tablet by mouth once daily 90 tablet 2    Testosterone 1.62 % GEL Apply 2 Pump topically daily. (Patient not taking: Reported on 08/30/2022)      traMADol (ULTRAM) 50 MG tablet TAKE 1 TABLET BY MOUTH EVERY 8 HOURS AS NEEDED FOR PAIN 90 tablet 0    traZODone (DESYREL) 50 MG tablet Take 0.5-1 tablets (25-50 mg total) by mouth at bedtime as needed for sleep. 90 tablet 1    No current facility-administered medications for this visit.   Allergies  Allergen Reactions   Heparin     HIT antibody and SRA positive    Social History   Tobacco Use   Smoking status: Never   Smokeless tobacco: Never  Substance Use Topics   Alcohol use: Yes    Alcohol/week: 7.0 standard drinks of alcohol    Types: 7 Cans of beer per week    Family History  Adopted: Yes  Problem Relation Age of Onset   Cancer Mother        breast cancer   Alcohol abuse Father    Breast cancer Sister    Cancer Sister      Review of Systems  Musculoskeletal:  Positive for arthralgias, gait problem and joint swelling.  All other systems reviewed and are negative.   Objective:  Physical Exam Constitutional:      Appearance: Normal appearance.  HENT:     Head: Normocephalic and atraumatic.     Nose: Nose normal.     Mouth/Throat:     Mouth: Mucous membranes are moist.     Pharynx: Oropharynx is clear.  Eyes:     Conjunctiva/sclera: Conjunctivae normal.  Cardiovascular:     Rate and Rhythm: Normal rate.     Pulses: Normal pulses.     Heart sounds: Normal heart sounds.  Pulmonary:     Effort: Pulmonary effort is normal.     Breath sounds: Normal breath sounds.  Abdominal:     General: Abdomen is flat.     Palpations: Abdomen is soft.   Genitourinary:    Comments: deferred Musculoskeletal:     Cervical back: Normal range of motion and neck supple.     Comments: Examination of left knee reveals scratch on lateral aspect of  his knee from reported cat scratch. He is got some swelling, trace effusion. No warmth or erythema. Varus deformity. He has tenderness to palpation medial joint line, lateral joint line, peripatellar retinacular tissues bilaterally. Range of motion is 15 to 110 degrees without any instability bilaterally. Painless range of motion of the hips.  Distally, there is no focal motor or sensory deficit. On the right side, he has a palpable pedal pulse. On the left side, his foot is warm and well-perfused, but I am unable to easily palpate pedal pulses. Venous stasis changes noted in lower extremities bilaterally.   Skin:    General: Skin is warm and dry.     Capillary Refill: Capillary refill takes less than 2 seconds.  Neurological:     General: No focal deficit present.     Mental Status: He is alert and oriented to person, place, and time.  Psychiatric:        Mood and Affect: Mood normal.        Behavior: Behavior normal.        Thought Content: Thought content normal.        Judgment: Judgment normal.     Vital signs in last 24 hours: @VSRANGES @  Labs:   Estimated body mass index is 35.83 kg/m as calculated from the following:   Height as of 08/30/22: 5\' 6"  (1.676 m).   Weight as of 08/30/22: 100.7 kg.   Imaging Review Plain radiographs demonstrate severe degenerative joint disease of the left knee(s). The overall alignment issignificant varus. The bone quality appears to be adequate for age and reported activity level.      Assessment/Plan:  End stage arthritis, left knee   The patient history, physical examination, clinical judgment of the provider and imaging studies are consistent with end stage degenerative joint disease of the left knee(s) and total knee arthroplasty is deemed  medically necessary. The treatment options including medical management, injection therapy arthroscopy and arthroplasty were discussed at length. The risks and benefits of total knee arthroplasty were presented and reviewed. The risks due to aseptic loosening, infection, stiffness, patella tracking problems, thromboembolic complications and other imponderables were discussed. The patient acknowledged the explanation, agreed to proceed with the plan and consent was signed. Patient is being admitted for inpatient treatment for surgery, pain control, PT, OT, prophylactic antibiotics, VTE prophylaxis, progressive ambulation and ADL's and discharge planning. The patient is planning to be discharged home with OPPT after an overnight stay.  Therapy Plans: outpatient therapy. At [facility]. 1st PT eval 09/10/22.  Disposition: Home with wife Planned DVT Prophylaxis: Eliquis 5 mg BID DME needed: walker. Has iceman ice machine.  PCP: Cleared. Cardiology: Cleared Vascular: Cleared.  TXA: IV Allergies:  - Heparin - HIT Anesthesia Concerns: None.  BMI: 35.7 Last HgbA1c: Not diabetic Other: - Afib - Eliquis. Stop 3 days prior to surgery.  - PAD.  - Cat scratch left knee lateral aspect. Discussed has to be healed prior to surgery.  - Chronic anemia. Last Hgb 10.1 - Tramadol once daily baseline.  - CAD sp CABG, with pacemaker/defibrillator  - Oxycodone, zofran.  - 08/07/22: Hgb 10.1, Restarted on retacrit injections. Discussed hemoglobin has to be high enough for surgery pending [facility] labs.  - 08/29/22: Hgb 10.9, Cr. 1.05, K+4.6.     Patient's anticipated LOS is less than 2 midnights, meeting these requirements: - Younger than 52 - Lives within 1 hour of care - Has a competent adult at home to recover with post-op recover -  NO history of  - Chronic pain requiring opiods  - Diabetes  - Coronary Artery Disease  - Heart failure  - Heart attack  - Stroke  - DVT/VTE  - Cardiac arrhythmia  -  Respiratory Failure/COPD  - Renal failure  - Anemia  - Advanced Liver disease

## 2022-08-21 NOTE — Telephone Encounter (Signed)
Pt has been scheduled for tele pre op appt 08/31/22 @ 9:40. Med rec and consent are done.     Patient Consent for Virtual Visit        Gregory [name] has provided verbal consent on 08/21/2022 for a virtual visit (video or telephone).   CONSENT FOR VIRTUAL VISIT FOR:  Gregory [name]  By participating in this virtual visit I agree to the following:  I hereby voluntarily request, consent and authorize [facility] and its employed or contracted physicians, physician assistants, nurse practitioners or other licensed health care professionals (the Practitioner), to provide me with telemedicine health care services (the "Services") as deemed necessary by the treating Practitioner. I acknowledge and consent to receive the Services by the Practitioner via telemedicine. I understand that the telemedicine visit will involve communicating with the Practitioner through live audiovisual communication technology and the disclosure of certain medical information by electronic transmission. I acknowledge that I have been given the opportunity to request an in-person assessment or other available alternative prior to the telemedicine visit and am voluntarily participating in the telemedicine visit.  I understand that I have the right to withhold or withdraw my consent to the use of telemedicine in the course of my care at any time, without affecting my right to future care or treatment, and that the Practitioner or I may terminate the telemedicine visit at any time. I understand that I have the right to inspect all information obtained and/or recorded in the course of the telemedicine visit and may receive copies of available information for a reasonable fee.  I understand that some of the potential risks of receiving the Services via telemedicine include:  Delay or interruption in medical evaluation due to technological equipment failure or disruption; Information transmitted may not be sufficient  (e.g. poor resolution of images) to allow for appropriate medical decision making by the Practitioner; and/or  In rare instances, security protocols could fail, causing a breach of personal health information.  Furthermore, I acknowledge that it is my responsibility to provide information about my medical history, conditions and care that is complete and accurate to the best of my ability. I acknowledge that Practitioner's advice, recommendations, and/or decision may be based on factors not within their control, such as incomplete or inaccurate data provided by me or distortions of diagnostic images or specimens that may result from electronic transmissions. I understand that the practice of medicine is not an exact science and that Practitioner makes no warranties or guarantees regarding treatment outcomes. I acknowledge that a copy of this consent can be made available to me via my patient portal [facility] MyChart), or I can request a printed copy by calling the office of [facility].    I understand that my insurance will be billed for this visit.   I have read or had this consent read to me. I understand the contents of this consent, which adequately explains the benefits and risks of the Services being provided via telemedicine.  I have been provided ample opportunity to ask questions regarding this consent and the Services and have had my questions answered to my satisfaction. I give my informed consent for the services to be provided through the use of telemedicine in my medical care

## 2022-08-22 DIAGNOSIS — E291 Testicular hypofunction: Secondary | ICD-10-CM | POA: Diagnosis not present

## 2022-08-28 ENCOUNTER — Inpatient Hospital Stay: Payer: Medicare HMO

## 2022-08-28 ENCOUNTER — Other Ambulatory Visit: Payer: Self-pay

## 2022-08-28 DIAGNOSIS — E538 Deficiency of other specified B group vitamins: Secondary | ICD-10-CM

## 2022-08-28 DIAGNOSIS — D509 Iron deficiency anemia, unspecified: Secondary | ICD-10-CM

## 2022-08-29 ENCOUNTER — Encounter: Payer: Self-pay | Admitting: Cardiology

## 2022-08-29 ENCOUNTER — Inpatient Hospital Stay: Payer: Medicare HMO

## 2022-08-29 ENCOUNTER — Encounter: Payer: Self-pay | Admitting: Hematology and Oncology

## 2022-08-29 VITALS — BP 114/71 | HR 73 | Resp 16

## 2022-08-29 DIAGNOSIS — E538 Deficiency of other specified B group vitamins: Secondary | ICD-10-CM

## 2022-08-29 DIAGNOSIS — M1712 Unilateral primary osteoarthritis, left knee: Secondary | ICD-10-CM | POA: Diagnosis not present

## 2022-08-29 DIAGNOSIS — D509 Iron deficiency anemia, unspecified: Secondary | ICD-10-CM

## 2022-08-29 DIAGNOSIS — D638 Anemia in other chronic diseases classified elsewhere: Secondary | ICD-10-CM | POA: Diagnosis not present

## 2022-08-29 LAB — CMP (CANCER CENTER ONLY)
ALT: 22 U/L (ref 0–44)
AST: 53 U/L — ABNORMAL HIGH (ref 15–41)
Albumin: 4.6 g/dL (ref 3.5–5.0)
Alkaline Phosphatase: 42 U/L (ref 38–126)
Anion gap: 6 (ref 5–15)
BUN: 26 mg/dL — ABNORMAL HIGH (ref 8–23)
CO2: 27 mmol/L (ref 22–32)
Calcium: 9.3 mg/dL (ref 8.9–10.3)
Chloride: 108 mmol/L (ref 98–111)
Creatinine: 1.05 mg/dL (ref 0.61–1.24)
GFR, Estimated: >60 mL/min (ref >60)
Glucose, Bld: 108 mg/dL — ABNORMAL HIGH (ref 70–99)
Potassium: 4.6 mmol/L (ref 3.5–5.1)
Sodium: 141 mmol/L (ref 135–145)
Total Bilirubin: 1.4 mg/dL — ABNORMAL HIGH (ref 0.3–1.2)
Total Protein: 6.8 g/dL (ref 6.5–8.1)

## 2022-08-29 LAB — CBC WITH DIFFERENTIAL (CANCER CENTER ONLY)
Abs Immature Granulocytes: 0.01 10*3/uL (ref 0.00–0.07)
Basophils Absolute: 0 10*3/uL (ref 0.0–0.1)
Basophils Relative: 1 %
Eosinophils Absolute: 0.1 10*3/uL (ref 0.0–0.5)
Eosinophils Relative: 1 %
HCT: 33 % — ABNORMAL LOW (ref 39.0–52.0)
Hemoglobin: 10.9 g/dL — ABNORMAL LOW (ref 13.0–17.0)
Immature Granulocytes: 0 %
Lymphocytes Relative: 31 %
Lymphs Abs: 1.3 10*3/uL (ref 0.7–4.0)
MCH: 35.5 pg — ABNORMAL HIGH (ref 26.0–34.0)
MCHC: 33 g/dL (ref 30.0–36.0)
MCV: 107.5 fL — ABNORMAL HIGH (ref 80.0–100.0)
Monocytes Absolute: 0.4 10*3/uL (ref 0.1–1.0)
Monocytes Relative: 10 %
Neutro Abs: 2.4 10*3/uL (ref 1.7–7.7)
Neutrophils Relative %: 57 %
Platelet Count: 189 10*3/uL (ref 150–400)
RBC: 3.07 MIL/uL — ABNORMAL LOW (ref 4.22–5.81)
RDW: 13.5 % (ref 11.5–15.5)
WBC Count: 4.3 10*3/uL (ref 4.0–10.5)
nRBC: 0 % (ref 0.0–0.2)

## 2022-08-29 LAB — FERRITIN: Ferritin: 64 ng/mL (ref 24–336)

## 2022-08-29 LAB — VITAMIN B12: Vitamin B-12: 635 pg/mL (ref 180–914)

## 2022-08-29 LAB — SAMPLE TO BLOOD BANK

## 2022-08-29 MED ORDER — EPOETIN ALFA 40000 UNIT/ML IJ SOLN
40000.0000 [IU] | Freq: Once | INTRAMUSCULAR | Status: AC
  Administered 2022-08-29: 40000 [IU] via SUBCUTANEOUS
  Filled 2022-08-29: qty 1

## 2022-08-29 MED ORDER — CYANOCOBALAMIN 1000 MCG/ML IJ SOLN
1000.0000 ug | Freq: Once | INTRAMUSCULAR | Status: AC
  Administered 2022-08-29: 1000 ug via INTRAMUSCULAR
  Filled 2022-08-29: qty 1

## 2022-08-29 NOTE — Patient Instructions (Signed)
SURGICAL WAITING ROOM VISITATION  Patients having surgery or a procedure may have no more than 2 support people in the waiting area - these visitors may rotate.    Children under the age of 63 must have an adult with them who is not the patient.  Due to an increase in RSV and influenza rates and associated hospitalizations, children ages 31 and under may not visit patients in [facility] hospitals.  If the patient needs to stay at the hospital during part of their recovery, the visitor guidelines for inpatient rooms apply. Pre-op nurse will coordinate an appropriate time for 1 support person to accompany patient in pre-op.  This support person may not rotate.    Please refer to the [facility] website for the visitor guidelines for Inpatients (after your surgery is over and you are in a regular room).    Your procedure is scheduled on: 09/06/22   Report to [facility] Main Entrance    Report to admitting at 8:10 AM   Call this number if you have problems the morning of surgery [phone]   Do not eat food :After Midnight.   After Midnight you may have the following liquids until 7:40 AM DAY OF SURGERY  Water Non-Citrus Juices (without pulp, NO RED-Apple, White grape, White cranberry) Black Coffee (NO MILK/CREAM OR CREAMERS, sugar ok)  Clear Tea (NO MILK/CREAM OR CREAMERS, sugar ok) regular and decaf                             Plain Jell-O (NO RED)                                           Fruit ices (not with fruit pulp, NO RED)                                     Popsicles (NO RED)                                                               Sports drinks like Gatorade (NO RED)               The day of surgery:  Drink ONE (1) Pre-Surgery Clear Ensure at 7:40 AM the morning of surgery. Drink in one sitting. Do not sip.  This drink was given to you during your hospital  pre-op appointment visit. Nothing else to drink after completing the  Pre-Surgery Clear Ensure.           If you have questions, please contact your surgeon's office.   FOLLOW BOWEL PREP AND ANY ADDITIONAL PRE OP INSTRUCTIONS YOU RECEIVED FROM YOUR SURGEON'S OFFICE!!!     Oral Hygiene is also important to reduce your risk of infection.                                    Remember - BRUSH YOUR TEETH THE MORNING OF SURGERY WITH YOUR REGULAR TOOTHPASTE  DENTURES WILL BE REMOVED  PRIOR TO SURGERY PLEASE DO NOT APPLY "Poly grip" OR ADHESIVES!!!   Stop all vitamins and herbal supplements 7 days before surgery.   Take these medicines the morning of surgery with A SIP OF WATER: Tylenol, Allopurinol, Alprazolam, Atorvastatin, Metoprolol, Sertraline, Tramadol   These are anesthesia recommendations for holding your anticoagulants.  Please contact your prescribing physician to confirm IF it is safe to hold your anticoagulants for this length of time.   Eliquis Apixaban   72 hours   Xarelto Rivaroxaban   72 hours  Plavix Clopidogrel   120 hours  Pletal Cilostazol   120 hours                                You may not have any metal on your body including jewelry, and body piercing             Do not wear lotions, powders, cologne, or deodorant              Men may shave face and neck.   Do not bring valuables to the hospital. [facility] IS NOT             RESPONSIBLE   FOR VALUABLES.   Contacts, glasses, dentures or bridgework may not be worn into surgery.   Bring small overnight bag day of surgery.   DO NOT BRING YOUR HOME MEDICATIONS TO THE HOSPITAL. PHARMACY WILL DISPENSE MEDICATIONS LISTED ON YOUR MEDICATION LIST TO YOU DURING YOUR ADMISSION IN THE HOSPITAL!   Special Instructions: Bring a copy of your healthcare power of attorney and living will documents the day of surgery if you haven't scanned them before.              Please read over the following fact sheets you were given: IF YOU HAVE QUESTIONS ABOUT YOUR PRE-OP INSTRUCTIONS PLEASE CALL [phone] [place]    If you  received a COVID test during your pre-op visit  it is requested that you wear a mask when out in public, stay away from anyone that may not be feeling well and notify your surgeon if you develop symptoms. If you test positive for Covid or have been in contact with anyone that has tested positive in the last 10 days please notify you surgeon.      Pre-operative 5 CHG Bath Instructions   You can play a key role in reducing the risk of infection after surgery. Your skin needs to be as free of germs as possible. You can reduce the number of germs on your skin by washing with CHG (chlorhexidine gluconate) soap before surgery. CHG is an antiseptic soap that kills germs and continues to kill germs even after washing.   DO NOT use if you have an allergy to chlorhexidine/CHG or antibacterial soaps. If your skin becomes reddened or irritated, stop using the CHG and notify one of our RNs at [phone].   Please shower with the CHG soap starting 4 days before surgery using the following schedule:     Please keep in mind the following:  DO NOT shave, including legs and underarms, starting the day of your first shower.   You may shave your face at any point before/day of surgery.  Place clean sheets on your bed the day you start using CHG soap. Use a clean washcloth (not used since being washed) for each shower. DO NOT sleep with pets once you start using the CHG.  CHG Shower Instructions:  If you choose to wash your hair and private area, wash first with your normal shampoo/soap.  After you use shampoo/soap, rinse your hair and body thoroughly to remove shampoo/soap residue.  Turn the water OFF and apply about 3 tablespoons (45 ml) of CHG soap to a CLEAN washcloth.  Apply CHG soap ONLY FROM YOUR NECK DOWN TO YOUR TOES (washing for 3-5 minutes)  DO NOT use CHG soap on face, private areas, open wounds, or sores.  Pay special attention to the area where your surgery is being performed.  If you are  having back surgery, having someone wash your back for you may be helpful. Wait 2 minutes after CHG soap is applied, then you may rinse off the CHG soap.  Pat dry with a clean towel  Put on clean clothes/pajamas   If you choose to wear lotion, please use ONLY the CHG-compatible lotions on the back of this paper.     Additional instructions for the day of surgery: DO NOT APPLY any lotions, deodorants, cologne, or perfumes.   Put on clean/comfortable clothes.  Brush your teeth.  Ask your nurse before applying any prescription medications to the skin.      CHG Compatible Lotions   Aveeno Moisturizing lotion  Cetaphil Moisturizing Cream  Cetaphil Moisturizing Lotion  Clairol Herbal Essence Moisturizing Lotion, Dry Skin  Clairol Herbal Essence Moisturizing Lotion, Extra Dry Skin  Clairol Herbal Essence Moisturizing Lotion, Normal Skin  Curel Age Defying Therapeutic Moisturizing Lotion with Alpha Hydroxy  Curel Extreme Care Body Lotion  Curel Soothing Hands Moisturizing Hand Lotion  Curel Therapeutic Moisturizing Cream, Fragrance-Free  Curel Therapeutic Moisturizing Lotion, Fragrance-Free  Curel Therapeutic Moisturizing Lotion, Original Formula  Eucerin Daily Replenishing Lotion  Eucerin Dry Skin Therapy Plus Alpha Hydroxy Crme  Eucerin Dry Skin Therapy Plus Alpha Hydroxy Lotion  Eucerin Original Crme  Eucerin Original Lotion  Eucerin Plus Crme Eucerin Plus Lotion  Eucerin TriLipid Replenishing Lotion  Keri Anti-Bacterial Hand Lotion  Keri Deep Conditioning Original Lotion Dry Skin Formula Softly Scented  Keri Deep Conditioning Original Lotion, Fragrance Free Sensitive Skin Formula  Keri Lotion Fast Absorbing Fragrance Free Sensitive Skin Formula  Keri Lotion Fast Absorbing Softly Scented Dry Skin Formula  Keri Original Lotion  Keri Skin Renewal Lotion Keri Silky Smooth Lotion  Keri Silky Smooth Sensitive Skin Lotion  Nivea Body Creamy Conditioning Oil  Nivea Body Extra  Enriched Lotion  Nivea Body Original Lotion  Nivea Body Sheer Moisturizing Lotion Nivea Crme  Nivea Skin Firming Lotion  NutraDerm 30 Skin Lotion  NutraDerm Skin Lotion  NutraDerm Therapeutic Skin Cream  NutraDerm Therapeutic Skin Lotion  ProShield Protective Hand Cream  Provon moisturizing lotion   Incentive Spirometer  An incentive spirometer is a tool that can help keep your lungs clear and active. This tool measures how well you are filling your lungs with each breath. Taking long deep breaths may help reverse or decrease the chance of developing breathing (pulmonary) problems (especially infection) following: A long period of time when you are unable to move or be active. BEFORE THE PROCEDURE  If the spirometer includes an indicator to show your best effort, your nurse or respiratory therapist will set it to a desired goal. If possible, sit up straight or lean slightly forward. Try not to slouch. Hold the incentive spirometer in an upright position. INSTRUCTIONS FOR USE  Sit on the edge of your bed if possible, or sit up as far as you can in bed or  on a chair. Hold the incentive spirometer in an upright position. Breathe out normally. Place the mouthpiece in your mouth and seal your lips tightly around it. Breathe in slowly and as deeply as possible, raising the piston or the ball toward the top of the column. Hold your breath for 3-5 seconds or for as long as possible. Allow the piston or ball to fall to the bottom of the column. Remove the mouthpiece from your mouth and breathe out normally. Rest for a few seconds and repeat Steps 1 through 7 at least 10 times every 1-2 hours when you are awake. Take your time and take a few normal breaths between deep breaths. The spirometer may include an indicator to show your best effort. Use the indicator as a goal to work toward during each repetition. After each set of 10 deep breaths, practice coughing to be sure your lungs are clear. If  you have an incision (the cut made at the time of surgery), support your incision when coughing by placing a pillow or rolled up towels firmly against it. Once you are able to get out of bed, walk around indoors and cough well. You may stop using the incentive spirometer when instructed by your caregiver.  RISKS AND COMPLICATIONS Take your time so you do not get dizzy or light-headed. If you are in pain, you may need to take or ask for pain medication before doing incentive spirometry. It is harder to take a deep breath if you are having pain. AFTER USE Rest and breathe slowly and easily. It can be helpful to keep track of a log of your progress. Your caregiver can provide you with a simple table to help with this. If you are using the spirometer at home, follow these instructions: SEEK MEDICAL CARE IF:  You are having difficultly using the spirometer. You have trouble using the spirometer as often as instructed. Your pain medication is not giving enough relief while using the spirometer. You develop fever of 100.5 F (38.1 C) or higher. SEEK IMMEDIATE MEDICAL CARE IF:  You cough up bloody sputum that had not been present before. You develop fever of 102 F (38.9 C) or greater. You develop worsening pain at or near the incision site. MAKE SURE YOU:  Understand these instructions. Will watch your condition. Will get help right away if you are not doing well or get worse. Document Released: 04/30/2006 Document Revised: 03/12/2011 Document Reviewed: 07/01/2006 [facility] Patient Information 2014 [place], [name].   ________________________________________________________________________

## 2022-08-29 NOTE — Progress Notes (Signed)
COVID Vaccine Completed: yes  Date of COVID positive in last 90 days:  PCP - Serena Croissant, MD Cardiologist - Rollene Rotunda, MD Electrophysiologist- Steffanie Dunn, MD  Chest x-ray -  EKG - 08/08/22 Epic Stress Test -  ECHO - 12/01/21 Epic Cardiac Cath - 03/04/19 Epic Pacemaker/ICD device last checked: 07/19/22 Epic- requested orders Spinal Cord Stimulator:  Bowel Prep -   Sleep Study -  CPAP -   Fasting Blood Sugar -  Checks Blood Sugar _____ times a day  Last dose of GLP1 agonist-  N/A GLP1 instructions:  N/A   Last dose of SGLT-2 inhibitors-  N/A SGLT-2 instructions: N/A   Blood Thinner Instructions:  Eliquis, hold 3 days Aspirin Instructions: Last Dose:  Activity level:  Can go up a flight of stairs and perform activities of daily living without stopping and without symptoms of chest pain or shortness of breath.  Able to exercise without symptoms  Unable to go up a flight of stairs without symptoms of     Anesthesia review: HTN, a fib, CAD, heart block, PVD, preDM, CABG x3, AVR, pacemaker, anemia  Patient denies shortness of breath, fever, cough and chest pain at PAT appointment  Patient verbalized understanding of instructions that were given to them at the PAT appointment. Patient was also instructed that they will need to review over the PAT instructions again at home before surgery.

## 2022-08-29 NOTE — Progress Notes (Signed)
PERIOPERATIVE PRESCRIPTION FOR IMPLANTED CARDIAC DEVICE PROGRAMMING  Patient Information: Name:  Gregory [name]  DOB:  [birth year]  MRN:  [id number]  Planned Procedure:  left total knee arthroplasty  Surgeon:  Dr. Linna Caprice  Date of Procedure:  09/06/22  Cautery will be used.  Position during surgery:  unknown    Device Information:  Clinic EP Physician:  Steffanie Dunn MD  Device Type:  Defibrillator Manufacturer and Phone #:  [employer]: [phone] Pacemaker Dependent?:  Yes.   Date of Last Device Check:  11/15/21 Normal Device Function?:  Yes.    Electrophysiologist's Recommendations:  Have magnet available. Provide continuous ECG monitoring when magnet is used or reprogramming is to be performed.  Procedure should not interfere with device function.  No device programming or magnet placement needed.  Per Device Clinic Standing Orders, Skip Mayer, RN  12:46 PM 08/29/2022

## 2022-08-29 NOTE — Patient Instructions (Addendum)
Epoetin Alfa Injection What is this medication? EPOETIN ALFA (e POE e tin AL fa) treats low levels of red blood cells (anemia) caused by kidney disease, chemotherapy, or HIV medications. It can also be used in people who are at risk for blood loss during surgery. It works by Systems analyst make more red blood cells, which reduces the need for blood transfusions. This medicine may be used for other purposes; ask your health care provider or pharmacist if you have questions. COMMON BRAND NAME(S): Epogen, Procrit, Retacrit What should I tell my care team before I take this medication? They need to know if you have any of these conditions: Blood clots Cancer Heart disease High blood pressure On dialysis Seizures Stroke An unusual or allergic reaction to epoetin alfa, albumin, benzyl alcohol, other medications, foods, dyes, or preservatives Pregnant or trying to get pregnant Breast-feeding How should I use this medication? This medication is injected into a vein or under the skin. It is usually given by your care team in a hospital or clinic setting. It may also be given at home. If you get this medication at home, you will be taught how to prepare and give it. Use exactly as directed. Take it as directed on the prescription label at the same time every day. Keep taking it unless your care team tells you to stop. It is important that you put your used needles and syringes in a special sharps container. Do not put them in a trash can. If you do not have a sharps container, call your pharmacist or care team to get one. A special MedGuide will be given to you by the pharmacist with each prescription and refill. Be sure to read this information carefully each time. Talk to your care team about the use of this medication in children. While this medication may be used in children as young as 1 month of age for selected conditions, precautions do apply. Overdosage: If you think you have taken too much  of this medicine contact a poison control center or emergency room at once. NOTE: This medicine is only for you. Do not share this medicine with others. What if I miss a dose? If you miss a dose, take it as soon as you can. If it is almost time for your next dose, take only that dose. Do not take double or extra doses. What may interact with this medication? Darbepoetin alfa Methoxy polyethylene glycol-epoetin beta This list may not describe all possible interactions. Give your health care provider a list of all the medicines, herbs, non-prescription drugs, or dietary supplements you use. Also tell them if you smoke, drink alcohol, or use illegal drugs. Some items may interact with your medicine. What should I watch for while using this medication? Visit your care team for regular checks on your progress. Check your blood pressure as directed. Know what your blood pressure should be and when to contact your care team. Your condition will be monitored carefully while you are receiving this medication. You may need blood work while taking this medication. What side effects may I notice from receiving this medication? Side effects that you should report to your care team as soon as possible: Allergic reactions--skin rash, itching, hives, swelling of the face, lips, tongue, or throat Blood clot--pain, swelling, or warmth in the leg, shortness of breath, chest pain Heart attack--pain or tightness in the chest, shoulders, arms, or jaw, nausea, shortness of breath, cold or clammy skin, feeling faint or lightheaded Increase  in blood pressure Rash, fever, and swollen lymph nodes Redness, blistering, peeling, or loosening of the skin, including inside the mouth Seizures Stroke--sudden numbness or weakness of the face, arm, or leg, trouble speaking, confusion, trouble walking, loss of balance or coordination, dizziness, severe headache, change in vision Side effects that usually do not require medical  attention (report to your care team if they continue or are bothersome): Bone, joint, or muscle pain Cough Headache Nausea Pain, redness, or irritation at injection site This list may not describe all possible side effects. Call your doctor for medical advice about side effects. You may report side effects to FDA at 1-800-FDA-1088. Where should I keep my medication? Keep out of the reach of children and pets. Store in a refrigerator. Do not freeze. Do not shake. Protect from light. Keep this medication in the original container until you are ready to take it. See product for storage information. Get rid of any unused medication after the expiration date. To get rid of medications that are no longer needed or have expired: Take the medication to a medication take-back program. Check with your pharmacy or law enforcement to find a location. If you cannot return the medication, ask your pharmacist or care team how to get rid of the medication safely. NOTE: This sheet is a summary. It may not cover all possible information. If you have questions about this medicine, talk to your doctor, pharmacist, or health care provider.  2024 Elsevier/Gold Standard (2021-04-21 00:00:00) Vitamin B12 and Folate Test Why am I having this test? Vitamin B12 and folate (folic acid) are B vitamins needed to make red blood cells and keep your nervous system healthy. Vitamin B12 is in foods such as meats, eggs, dairy products, and fish. Folate is in fruits, beans, and leafy green vegetables. Some foods, such as whole grains, bread, and cereals have vitamin B12 added to them (are fortified). You may not have enough of these B vitamins (have a deficiency) if your diet lacks these vitamins. Low levels can also be caused by diseases or having had surgeries on your stomach or small intestine that interfere with your ability to absorb the vitamins from your food. You may have a vitamin B12 and folate test if: You have symptoms  of vitamin B12 or folate deficiency, such as tiredness (fatigue), headache, confusion, poor balance, or tingling and numbness in your hands and feet. You are pregnant or breastfeeding. Women who are pregnant or breastfeeding need more folate and may need to take dietary supplements. Your red blood cell count is low (anemia). You are an older person and have mental confusion. You have a disease or condition that may lead to a deficiency of these B vitamins. What is being tested? This test measures the amount of vitamin B12 and folate in your blood. The tests for vitamin B12 and folate may be done together or separately. What kind of sample is taken?  A blood sample is required for this test. It is usually collected by inserting a needle into a blood vessel. How do I prepare for this test? Follow instructions from your health care provider about eating and drinking before the test. Tell a health care provider about: All medicines you are taking, including vitamins, herbs, eye drops, creams, and over-the-counter medicines. Any medical conditions you have. Whether you are pregnant or may be pregnant. How often you drink alcohol. How are the results reported? Your test results will be reported as values that identify the amount of vitamin  B12 and folate in your blood. Your health care provider will compare your results to normal ranges that were established after testing a large group of people (reference ranges). Reference ranges may vary among labs and hospitals. For this test, common reference ranges are: Vitamin B12: 160-950 pg/mL or 118-701 pmol/L (SI units). Folate: 5-25 ng/mL or 11-57 nmol/L (SI units). What do the results mean? Results within the reference range are considered normal. Vitamin B12 or folate levels that are lower than the reference range may be caused by: Poor nutrition or eating a vegetarian or vegan diet that does not include any foods that come from animals. Having  alcoholism. Having certain diseases that make it hard to absorb vitamin B12. These diseases include Crohn's disease, chronic pancreatitis, and cystic fibrosis. Taking certain medicines. Having had surgeries on your stomach or small intestine. High levels of vitamin B12 are rare, but they may happen if you have: Cancer. Liver disease. High levels of folate may happen if: You have anemia. You are vegetarian. You have had a recent blood transfusion. Talk with your health care provider about what your results mean. Questions to ask your health care provider Ask your health care provider, or the department that is doing the test: When will my results be ready? How will I get my results? What are my treatment options? What other tests do I need? What are my next steps? Summary Vitamin B12 and folate (folic acid) are both B vitamins that are needed to make red blood cells and to keep your nervous system healthy. You may not have enough B vitamins in your body if you do not get enough in your diet or if you have a disease that makes it hard to absorb vitamin B12. This test measures the amount of vitamin B12 and folate in your blood. A blood sample is required for the test. Talk with your health care provider about what your results mean. This information is not intended to replace advice given to you by your health care provider. Make sure you discuss any questions you have with your health care provider. Document Revised: 08/12/2020 Document Reviewed: 08/12/2020 Elsevier Patient Education  2024 [employer].

## 2022-08-30 ENCOUNTER — Other Ambulatory Visit: Payer: Self-pay

## 2022-08-30 ENCOUNTER — Encounter (HOSPITAL_COMMUNITY)
Admission: RE | Admit: 2022-08-30 | Discharge: 2022-08-30 | Disposition: A | Discharge status: Home / Self Care | Payer: Medicare HMO | Source: Ambulatory Visit | Attending: Orthopedic Surgery | Admitting: Orthopedic Surgery

## 2022-08-30 ENCOUNTER — Encounter (HOSPITAL_COMMUNITY): Payer: Self-pay

## 2022-08-30 VITALS — BP 118/70 | HR 75 | Temp 98.0°F | Ht 66.0 in | Wt 222.0 lb

## 2022-08-30 DIAGNOSIS — Z950 Presence of cardiac pacemaker: Secondary | ICD-10-CM | POA: Insufficient documentation

## 2022-08-30 DIAGNOSIS — I110 Hypertensive heart disease with heart failure: Secondary | ICD-10-CM | POA: Diagnosis not present

## 2022-08-30 DIAGNOSIS — I509 Heart failure, unspecified: Secondary | ICD-10-CM | POA: Insufficient documentation

## 2022-08-30 DIAGNOSIS — I2510 Atherosclerotic heart disease of native coronary artery without angina pectoris: Secondary | ICD-10-CM | POA: Insufficient documentation

## 2022-08-30 DIAGNOSIS — Z01818 Encounter for other preprocedural examination: Secondary | ICD-10-CM

## 2022-08-30 DIAGNOSIS — M1712 Unilateral primary osteoarthritis, left knee: Secondary | ICD-10-CM | POA: Diagnosis not present

## 2022-08-30 DIAGNOSIS — Z951 Presence of aortocoronary bypass graft: Secondary | ICD-10-CM | POA: Diagnosis not present

## 2022-08-30 DIAGNOSIS — Z01812 Encounter for preprocedural laboratory examination: Secondary | ICD-10-CM | POA: Insufficient documentation

## 2022-08-30 LAB — IRON AND IRON BINDING CAPACITY (CC-WL,HP ONLY)
Iron: 98 ug/dL (ref 45–182)
Saturation Ratios: 24 % (ref 17.9–39.5)
TIBC: 406 ug/dL (ref 250–450)
UIBC: 308 ug/dL

## 2022-08-30 LAB — SURGICAL PCR SCREEN
MRSA, PCR: NEGATIVE
Staphylococcus aureus: NEGATIVE

## 2022-08-30 NOTE — Progress Notes (Signed)
   08/30/22 0900  OBSTRUCTIVE SLEEP APNEA  Have you ever been diagnosed with sleep apnea through a sleep study? No  Do you snore loudly (loud enough to be heard through closed doors)?  1  Do you often feel tired, fatigued, or sleepy during the daytime (such as falling asleep during driving or talking to someone)? 0  Has anyone observed you stop breathing during your sleep? 0  Do you have, or are you being treated for high blood pressure? 1  BMI more than 35 kg/m2? 1  Age > 50 (1-yes) 1  Neck circumference greater than:Male 16 inches or larger, Male 17inches or larger? 0  Male Gender (Yes=1) 1  Obstructive Sleep Apnea Score 5

## 2022-08-31 ENCOUNTER — Ambulatory Visit: Payer: Medicare HMO | Attending: Cardiology | Admitting: Nurse Practitioner

## 2022-08-31 DIAGNOSIS — Z01810 Encounter for preprocedural cardiovascular examination: Secondary | ICD-10-CM | POA: Diagnosis not present

## 2022-08-31 NOTE — Progress Notes (Signed)
Anesthesia Chart Review   Case: [id number] Date/Time: 09/06/22 1025   Procedure: COMPUTER ASSISTED TOTAL KNEE ARTHROPLASTY (Left: Knee)   Anesthesia type: Spinal   Pre-op diagnosis: Left knee osteoarthritis   Location: [facility] ROOM 08 / [facility]   Surgeons: Samson Frederic, MD       DISCUSSION:72 y.o. never smoker with h/o hypertension, PVD, CAD (CABG x 3)., CHF, pacemaker in place (device orders in 08/29/2022 progress note), s/p AV replacement, heparin-induced thrombocytopenia and chronic macrocytic anemia, left knee OA scheduled for above procedure 09/06/2022 with Dr. Samson Frederic.  Per cardiology preoperative evaluation 08/31/2022, "According to the Revised Cardiac Risk Index (RCRI), his Perioperative Risk of Major Cardiac Event is (%): 6.6. His Functional Capacity in METs is: 7.34 according to the Duke Activity Status Index (DASI). Therefore, based on ACC/AHA guidelines, patient would be at acceptable risk for the planned procedure without further cardiovascular testing.    The patient was advised that if he develops new symptoms prior to surgery to contact our office to arrange for a follow-up visit, and he verbalized understanding.   Per office protocol, patient can hold Eliquis for 3 days prior to procedure."  VS: BP 118/70   Pulse 75   Temp 36.7 C (Oral)   Ht 5\' 6"  (1.676 m)   Wt 100.7 kg   SpO2 96%   BMI 35.83 kg/m   PROVIDERS: Excell Seltzer, MD is PCP  Primary Cardiologist:  Rollene Rotunda, MD   LABS: Labs reviewed: Acceptable for surgery. (all labs ordered are listed, but only abnormal results are displayed)  Labs Reviewed  SURGICAL PCR SCREEN     IMAGES:   EKG:   CV: Echo 12/01/2021  1. Left ventricular ejection fraction, by estimation, is 60 to 65%. The  left ventricle has normal function. The left ventricle has no regional  wall motion abnormalities. There is mild left ventricular hypertrophy.  Left ventricular diastolic parameters  are consistent with Grade  I diastolic dysfunction (impaired relaxation).   2. Right ventricular systolic function is normal. The right ventricular  size is normal.   3. The mitral valve is normal in structure. No evidence of mitral valve  regurgitation. No evidence of mitral stenosis.   4. The aortic valve is normal in structure. Aortic valve regurgitation is  mild. No aortic stenosis is present. Aortic valve mean gradient measures  13.0 mmHg. Aortic valve Vmax measures 2.42 m/s.   5. Aortic dilatation noted. There is moderate dilatation of the ascending  aorta, measuring 45 mm.   6. The inferior vena cava is normal in size with greater than 50%  respiratory variability, suggesting right atrial pressure of 3 mmHg.  Past Medical History:  Diagnosis Date   Alcohol abuse    Allergy    Heparin   Anemia    Anxiety    Aortic valve disease    Arthritis    CHF (congestive heart failure) (HCC)    Complication of anesthesia    slow to wake up and disoriented after OHS- 2021 at [facility]   Coronary artery disease    Depression    Glaucoma    Heart murmur    Hypertension    Myocardial infarction [facility])    Peripheral vascular disease (HCC)    Pre-diabetes    Presence of permanent cardiac pacemaker    S/P balloon aortic valvuloplasty 10/18/2020   done for bioprosthetic aortic valve replacement perivalvular leaking reducing it from severe to mild.   Shingles     Past Surgical  History:  Procedure Laterality Date   AORTIC ARCH ANGIOGRAPHY N/A 10/10/2020   Procedure: AORTIC ARCH ANGIOGRAPHY;  Surgeon: Yvonne Kendall, MD;  [facility];  Service: Cardiovascular;  Laterality: N/A;   AORTIC VALVE REPLACEMENT N/A 03/06/2019   Procedure: AORTIC VALVE REPLACEMENT (AVR), USING INTUITY ;  Surgeon: Linden Dolin, MD;  Location: [facility] OR;  Service: Open Heart Surgery;  Laterality: N/A;   BALLOON AORTIC VALVE VALVULOPLASTY N/A 10/18/2020   Procedure: BALLOON AORTIC VALVE VALVULOPLASTY;  Surgeon: Tonny Bollman, MD;  Location: [facility] OR;  Service: Cardiovascular;  Laterality: N/A;   BIV PACEMAKER INSERTION CRT-P N/A 10/20/2020   Procedure: BIV PACEMAKER INSERTION CRT-P;  Surgeon: Lanier Prude, MD;  Location: [facility] INVASIVE CV LAB;  Service: Cardiovascular;  Laterality: N/A;   CARDIAC CATHETERIZATION     CARDIAC VALVE REPLACEMENT     Aortic valve replaced   CATARACT EXTRACTION     CORONARY ARTERY BYPASS GRAFT N/A 03/06/2019   Procedure: CORONARY ARTERY BYPASS GRAFTING (CABG), ON PUMP, TIMES THREE, USING BILATERAL INTERNAL MAMMARIES AND LEFT RADIAL ARTERY HARVEST;  Surgeon: Linden Dolin, MD;  [facility];  Service: Open Heart Surgery;  Laterality: N/A;  BILATERAL IMA   EYE SURGERY     Cataracts removed   LEFT HEART CATH AND CORONARY ANGIOGRAPHY N/A 03/04/2019   Procedure: LEFT HEART CATH AND CORONARY ANGIOGRAPHY;  Surgeon: Kathleene Hazel, MD;  [facility];  Service: Cardiovascular;  Laterality: N/A;   None     RADIAL ARTERY HARVEST Left 03/06/2019   Procedure: RADIAL ARTERY HARVEST;  Surgeon: Linden Dolin, MD;  [facility];  Service: Open Heart Surgery;  Laterality: Left;   RIGHT HEART CATH AND CORONARY/GRAFT ANGIOGRAPHY N/A 10/10/2020   Procedure: RIGHT HEART CATH AND CORONARY/GRAFT ANGIOGRAPHY;  Surgeon: Yvonne Kendall, MD;  [facility];  Service: Cardiovascular;  Laterality: N/A;   TEE WITHOUT CARDIOVERSION N/A 03/06/2019   Procedure: TRANSESOPHAGEAL ECHOCARDIOGRAM (TEE);  Surgeon: Linden Dolin, MD;  Location: [facility] OR;  Service: Open Heart Surgery;  Laterality: N/A;   TEE WITHOUT CARDIOVERSION N/A 10/10/2020   Procedure: TRANSESOPHAGEAL ECHOCARDIOGRAM (TEE);  Surgeon: Wendall Stade, MD;  Location: [facility] ENDOSCOPY;  Service: Cardiovascular;  Laterality: N/A;   TEE WITHOUT CARDIOVERSION N/A 10/18/2020   Procedure: TRANSESOPHAGEAL ECHOCARDIOGRAM (TEE);  Surgeon: Tonny Bollman, MD;  Location: [facility] OR;  Service: Open Heart Surgery;   Laterality: N/A;   TEMPORARY PACEMAKER N/A 10/17/2020   Procedure: TEMPORARY PACEMAKER;  Surgeon: Marinus Maw, MD;  Location: [facility] INVASIVE CV LAB;  Service: Cardiovascular;  Laterality: N/A;    MEDICATIONS:  acetaminophen (TYLENOL) 500 MG tablet   allopurinol (ZYLOPRIM) 100 MG tablet   ALPRAZolam (XANAX) 0.5 MG tablet   apixaban (ELIQUIS) 5 MG TABS tablet   atorvastatin (LIPITOR) 80 MG tablet   colchicine 0.6 MG tablet   furosemide (LASIX) 20 MG tablet   JATENZO 237 MG CAPS   ketoconazole (NIZORAL) 2 % shampoo   latanoprost (XALATAN) 0.005 % ophthalmic solution   metoprolol succinate (TOPROL-XL) 25 MG 24 hr tablet   Misc Natural Products (OSTEO BI-FLEX/5-LOXIN ADVANCED) TABS   potassium chloride SA (KLOR-CON M20) 20 MEQ tablet   sacubitril-valsartan (ENTRESTO) 97-103 MG   sertraline (ZOLOFT) 50 MG tablet   spironolactone (ALDACTONE) 25 MG tablet   Testosterone 1.62 % GEL   traMADol (ULTRAM) 50 MG tablet   traZODone (DESYREL) 50 MG tablet   No current facility-administered medications for this encounter.  Jodell Cipro Ward, PA-C [facility] [phone]

## 2022-08-31 NOTE — Progress Notes (Signed)
Virtual Visit via Telephone Note   Because of Gregory [name]'s co-morbid illnesses, he is at least at moderate risk for complications without adequate follow up.  This format is felt to be most appropriate for this patient at this time.  The patient did not have access to video technology/had technical difficulties with video requiring transitioning to audio format only (telephone).  All issues noted in this document were discussed and addressed.  No physical exam could be performed with this format.  Please refer to the patient's chart for his consent to telehealth for [facility].  Evaluation Performed:  Preoperative cardiovascular risk assessment _____________   Date:  08/31/2022   Patient ID:  Gregory [name], DOB January 29, 1950, MRN [id number] Patient Location:  Home Provider location:   Office  Primary Care Provider:  Excell Seltzer, MD Primary Cardiologist:  Rollene Rotunda, MD  Chief Complaint / Patient Profile   72 y.o. y/o male with a h/o CAD s/p CABG x3, aortic valve disease s/p AVR in 2021 and hemolytic anemia secondary to severe bioprosthetic valve dysfunction with severe paravalvular leaking s/p successful balloon valvuloplasty in 10/2020,  paroxysmal atrial fibrillation, chronic combined systolic and diastolic heart failure, thoracic aortic aneurysm, high-grade AV block s/p PPM, hypertension, and postop HIT who is pending left total knee arthroplasty on 09/06/2022 with Dr. Loreli Dollar of [facility] and presents today for telephonic preoperative cardiovascular risk assessment.  History of Present Illness    Gregory [name] is a 72 y.o. male who presents via audio/video conferencing for a telehealth visit today.  Pt was last seen in cardiology clinic on 08/08/2022 by Lake Bells, PA.  At that time Gregory [name] was doing well.  The patient is now pending procedure as outlined above. Since his last visit, he has done well from a cardiac standpoint.   He denies  chest pain, palpitations, dyspnea, pnd, orthopnea, n, v, dizziness, syncope, edema, weight gain, or early satiety. All other systems reviewed and are otherwise negative except as noted above.   Past Medical History    Past Medical History:  Diagnosis Date   Alcohol abuse    Allergy    Heparin   Anemia    Anxiety    Aortic valve disease    Arthritis    CHF (congestive heart failure) (HCC)    Complication of anesthesia    slow to wake up and disoriented after OHS- 2021 at [facility]   Coronary artery disease    Depression    Glaucoma    Heart murmur    Hypertension    Myocardial infarction [facility])    Peripheral vascular disease (HCC)    Pre-diabetes    Presence of permanent cardiac pacemaker    S/P balloon aortic valvuloplasty 10/18/2020   done for bioprosthetic aortic valve replacement perivalvular leaking reducing it from severe to mild.   Shingles    Past Surgical History:  Procedure Laterality Date   AORTIC ARCH ANGIOGRAPHY N/A 10/10/2020   Procedure: AORTIC ARCH ANGIOGRAPHY;  Surgeon: Yvonne Kendall, MD;  [facility];  Service: Cardiovascular;  Laterality: N/A;   AORTIC VALVE REPLACEMENT N/A 03/06/2019   Procedure: AORTIC VALVE REPLACEMENT (AVR), USING INTUITY ;  Surgeon: Linden Dolin, MD;  Location: [facility] OR;  Service: Open Heart Surgery;  Laterality: N/A;   BALLOON AORTIC VALVE VALVULOPLASTY N/A 10/18/2020   Procedure: BALLOON AORTIC VALVE VALVULOPLASTY;  Surgeon: Tonny Bollman, MD;  Location: [facility] OR;  Service: Cardiovascular;  Laterality: N/A;  BIV PACEMAKER INSERTION CRT-P N/A 10/20/2020   Procedure: BIV PACEMAKER INSERTION CRT-P;  Surgeon: Lanier Prude, MD;  Location: [facility] INVASIVE CV LAB;  Service: Cardiovascular;  Laterality: N/A;   CARDIAC CATHETERIZATION     CARDIAC VALVE REPLACEMENT     Aortic valve replaced   CATARACT EXTRACTION     CORONARY ARTERY BYPASS GRAFT N/A 03/06/2019   Procedure: CORONARY ARTERY BYPASS GRAFTING (CABG), ON PUMP,  TIMES THREE, USING BILATERAL INTERNAL MAMMARIES AND LEFT RADIAL ARTERY HARVEST;  Surgeon: Linden Dolin, MD;  [facility];  Service: Open Heart Surgery;  Laterality: N/A;  BILATERAL IMA   EYE SURGERY     Cataracts removed   LEFT HEART CATH AND CORONARY ANGIOGRAPHY N/A 03/04/2019   Procedure: LEFT HEART CATH AND CORONARY ANGIOGRAPHY;  Surgeon: Kathleene Hazel, MD;  [facility];  Service: Cardiovascular;  Laterality: N/A;   None     RADIAL ARTERY HARVEST Left 03/06/2019   Procedure: RADIAL ARTERY HARVEST;  Surgeon: Linden Dolin, MD;  [facility];  Service: Open Heart Surgery;  Laterality: Left;   RIGHT HEART CATH AND CORONARY/GRAFT ANGIOGRAPHY N/A 10/10/2020   Procedure: RIGHT HEART CATH AND CORONARY/GRAFT ANGIOGRAPHY;  Surgeon: Yvonne Kendall, MD;  [facility];  Service: Cardiovascular;  Laterality: N/A;   TEE WITHOUT CARDIOVERSION N/A 03/06/2019   Procedure: TRANSESOPHAGEAL ECHOCARDIOGRAM (TEE);  Surgeon: Linden Dolin, MD;  Location: [facility] OR;  Service: Open Heart Surgery;  Laterality: N/A;   TEE WITHOUT CARDIOVERSION N/A 10/10/2020   Procedure: TRANSESOPHAGEAL ECHOCARDIOGRAM (TEE);  Surgeon: Wendall Stade, MD;  Location: [facility] ENDOSCOPY;  Service: Cardiovascular;  Laterality: N/A;   TEE WITHOUT CARDIOVERSION N/A 10/18/2020   Procedure: TRANSESOPHAGEAL ECHOCARDIOGRAM (TEE);  Surgeon: Tonny Bollman, MD;  Location: [facility] OR;  Service: Open Heart Surgery;  Laterality: N/A;   TEMPORARY PACEMAKER N/A 10/17/2020   Procedure: TEMPORARY PACEMAKER;  Surgeon: Marinus Maw, MD;  Location: [facility] INVASIVE CV LAB;  Service: Cardiovascular;  Laterality: N/A;    Allergies  Allergies  Allergen Reactions   Heparin     HIT antibody and SRA positive    Home Medications    Prior to Admission medications   Medication Sig Start Date End Date Taking? Authorizing Provider  acetaminophen (TYLENOL) 500 MG tablet Take 1,000 mg by mouth every 6 (six)  hours as needed for moderate pain.    [provider]  allopurinol (ZYLOPRIM) 100 MG tablet Take 1 tablet by mouth once daily 11/26/21   Bedsole, Amy E, MD  ALPRAZolam (XANAX) 0.5 MG tablet TAKE 1 TABLET BY MOUTH ONCE DAILY AS  NEEDED  FOR  ANXIETY  TAKE  30  MINUTES  PRIOR  TO  PLANE FLIGHT OR AS NEEDED FOR ANXIETY 11/01/21   Bedsole, Amy E, MD  apixaban (ELIQUIS) 5 MG TABS tablet Take 1 tablet (5 mg total) by mouth 2 (two) times daily. 08/08/22   Eustace Pen, PA-C  atorvastatin (LIPITOR) 80 MG tablet Take 1 tablet by mouth once daily 07/06/22   Bedsole, Amy E, MD  colchicine 0.6 MG tablet Take 2 tablets by mouth once daily Patient taking differently: Take 0.6 mg by mouth daily. 05/21/22   Bedsole, Amy E, MD  furosemide (LASIX) 20 MG tablet TAKE TWO TABLETS BY MOUTH IN THE MORNING AND TAKE ONE TABLET BY MOUTH IN THE AFTERNOON 01/31/22   Rollene Rotunda, MD  JATENZO 237 MG CAPS Take 237 mg by mouth 2 (two) times daily. 02/09/22  [provider]  ketoconazole (NIZORAL) 2 % shampoo Apply 1 application topically 2 (two) times a week. Patient taking differently: Apply 1 application  topically daily as needed for irritation. 05/09/20   Bedsole, Amy E, MD  latanoprost (XALATAN) 0.005 % ophthalmic solution Place 1 drop into both eyes every morning.  10/25/18   [provider]  metoprolol succinate (TOPROL-XL) 25 MG 24 hr tablet Take 1 tablet (25 mg total) by mouth 2 (two) times daily. 01/05/21   Rollene Rotunda, MD  Misc Natural Products (OSTEO BI-FLEX/5-LOXIN ADVANCED) TABS Take 2 tablets by mouth daily.    [provider]  potassium chloride SA (KLOR-CON M20) 20 MEQ tablet Take 1 tablet by mouth once daily 12/12/21   Rollene Rotunda, MD  sacubitril-valsartan [facility]) 97-103 MG Take 1 tablet by mouth twice daily 07/13/22   Rollene Rotunda, MD  sertraline (ZOLOFT) 50 MG tablet Take 1 tablet by mouth once daily 05/08/22   Excell Seltzer, MD  spironolactone (ALDACTONE) 25 MG  tablet Take 1 tablet by mouth once daily 04/05/22   Rollene Rotunda, MD  Testosterone 1.62 % GEL Apply 2 Pump topically daily. Patient not taking: Reported on 08/30/2022 06/11/22   [provider]  traMADol (ULTRAM) 50 MG tablet TAKE 1 TABLET BY MOUTH EVERY 8 HOURS AS NEEDED FOR PAIN 07/10/22   Excell Seltzer, MD  traZODone (DESYREL) 50 MG tablet Take 0.5-1 tablets (25-50 mg total) by mouth at bedtime as needed for sleep. 03/14/22   Excell Seltzer, MD    Physical Exam    Vital Signs:  LANSON ESBENSHADE does not have vital signs available for review today.  Given telephonic nature of communication, physical exam is limited. AAOx3. NAD. Normal affect.  Speech and respirations are unlabored.  Accessory Clinical Findings    None  Assessment & Plan    1.  Preoperative Cardiovascular Risk Assessment:  According to the Revised Cardiac Risk Index (RCRI), his Perioperative Risk of Major Cardiac Event is (%): 6.6. His Functional Capacity in METs is: 7.34 according to the Duke Activity Status Index (DASI). Therefore, based on ACC/AHA guidelines, patient would be at acceptable risk for the planned procedure without further cardiovascular testing.   The patient was advised that if he develops new symptoms prior to surgery to contact our office to arrange for a follow-up visit, and he verbalized understanding.  Per office protocol, patient can hold Eliquis for 3 days prior to procedure.   Patient will not need bridging with Lovenox (enoxaparin) around procedure. Please resume Eliquis as soon as possible postprocedure, at the discretion of the surgeon.     A copy of this note will be routed to requesting surgeon.  Time:   Today, I have spent 5 minutes with the patient with telehealth technology discussing medical history, symptoms, and management plan.     Joylene Grapes, NP  08/31/2022, 10:08 AM

## 2022-08-31 NOTE — Anesthesia Preprocedure Evaluation (Signed)
Anesthesia Evaluation    Airway        Dental   Pulmonary           Cardiovascular hypertension,      Neuro/Psych    GI/Hepatic   Endo/Other    Renal/GU      Musculoskeletal   Abdominal   Peds  Hematology   Anesthesia Other Findings   Reproductive/Obstetrics                             Anesthesia Physical Anesthesia Plan  ASA:   Anesthesia Plan:    Post-op Pain Management:    Induction:   PONV Risk Score and Plan:   Airway Management Planned:   Additional Equipment:   Intra-op Plan:   Post-operative Plan:   Informed Consent:   Plan Discussed with:   Anesthesia Plan Comments: (See PAT note 08/30/2022)       Anesthesia Quick Evaluation  BUN                      26 (H)              08/29/2022                CREATININE               1.05                08/29/2022                CALCIUM                  9.3                 08/29/2022                GFR                      82.43               04/25/2022                EGFR                     61                  11/02/2020                GFRNONAA                 >60                 08/29/2022                Musculoskeletal  (+) Arthritis ,    Abdominal   Peds  Hematology  (+) Blood dyscrasia, anemia Lab Results      Component                Value               Date                      WBC                      4.3                 08/29/2022                HGB                      10.9 (L)            08/29/2022                HCT                      33.0 (L)            08/29/2022                MCV                      107.5 (H)           08/29/2022                PLT  189                 08/29/2022              Anesthesia Other Findings   Reproductive/Obstetrics                              Anesthesia Physical Anesthesia Plan  ASA: 3  Anesthesia Plan: MAC, Regional and Spinal   Post-op Pain Management:    Induction:   PONV Risk Score and Plan: 2 and Propofol infusion, Treatment may vary due to age or medical condition and Ondansetron  Airway Management Planned: Nasal Cannula, Natural Airway and Simple Face Mask  Additional Equipment: None  Intra-op Plan:    Post-operative Plan:   Informed Consent: I have reviewed the patients History and Physical, chart, labs and discussed the procedure including the risks, benefits and alternatives for the proposed anesthesia with the patient or authorized representative who has indicated his/her understanding and acceptance.     Dental advisory given  Plan Discussed with: CRNA  Anesthesia Plan Comments: (See PAT note 08/30/2022)        Anesthesia Quick Evaluation

## 2022-09-03 ENCOUNTER — Other Ambulatory Visit: Payer: Self-pay | Admitting: Family Medicine

## 2022-09-06 ENCOUNTER — Ambulatory Visit (HOSPITAL_COMMUNITY): Payer: Medicare HMO | Admitting: Certified Registered Nurse Anesthetist

## 2022-09-06 ENCOUNTER — Other Ambulatory Visit: Payer: Self-pay

## 2022-09-06 ENCOUNTER — Observation Stay (HOSPITAL_COMMUNITY): Payer: Medicare HMO

## 2022-09-06 ENCOUNTER — Ambulatory Visit (HOSPITAL_COMMUNITY): Payer: Medicare HMO | Admitting: Physician Assistant

## 2022-09-06 ENCOUNTER — Encounter (HOSPITAL_COMMUNITY)
Admission: RE | Disposition: A | Discharge status: Home / Self Care | Payer: Self-pay | Source: Home / Self Care | Attending: Orthopedic Surgery

## 2022-09-06 ENCOUNTER — Encounter (HOSPITAL_COMMUNITY): Payer: Self-pay | Admitting: Orthopedic Surgery

## 2022-09-06 ENCOUNTER — Inpatient Hospital Stay (HOSPITAL_COMMUNITY)
Admission: RE | Admit: 2022-09-06 | Discharge: 2022-09-09 | DRG: 470 | Disposition: A | Discharge status: Home / Self Care | Payer: Medicare HMO | Attending: Orthopedic Surgery | Admitting: Orthopedic Surgery

## 2022-09-06 DIAGNOSIS — M1A9XX0 Chronic gout, unspecified, without tophus (tophi): Secondary | ICD-10-CM | POA: Diagnosis present

## 2022-09-06 DIAGNOSIS — Z953 Presence of xenogenic heart valve: Secondary | ICD-10-CM

## 2022-09-06 DIAGNOSIS — I2510 Atherosclerotic heart disease of native coronary artery without angina pectoris: Secondary | ICD-10-CM | POA: Diagnosis present

## 2022-09-06 DIAGNOSIS — N289 Disorder of kidney and ureter, unspecified: Secondary | ICD-10-CM | POA: Diagnosis present

## 2022-09-06 DIAGNOSIS — G8918 Other acute postprocedural pain: Secondary | ICD-10-CM | POA: Diagnosis not present

## 2022-09-06 DIAGNOSIS — R609 Edema, unspecified: Secondary | ICD-10-CM | POA: Diagnosis not present

## 2022-09-06 DIAGNOSIS — Z951 Presence of aortocoronary bypass graft: Secondary | ICD-10-CM

## 2022-09-06 DIAGNOSIS — M1712 Unilateral primary osteoarthritis, left knee: Secondary | ICD-10-CM

## 2022-09-06 DIAGNOSIS — I5032 Chronic diastolic (congestive) heart failure: Secondary | ICD-10-CM | POA: Diagnosis present

## 2022-09-06 DIAGNOSIS — Z96652 Presence of left artificial knee joint: Secondary | ICD-10-CM | POA: Diagnosis not present

## 2022-09-06 DIAGNOSIS — Z79899 Other long term (current) drug therapy: Secondary | ICD-10-CM

## 2022-09-06 DIAGNOSIS — Z95810 Presence of automatic (implantable) cardiac defibrillator: Secondary | ICD-10-CM

## 2022-09-06 DIAGNOSIS — Z888 Allergy status to other drugs, medicaments and biological substances status: Secondary | ICD-10-CM

## 2022-09-06 DIAGNOSIS — F411 Generalized anxiety disorder: Secondary | ICD-10-CM | POA: Diagnosis present

## 2022-09-06 DIAGNOSIS — I110 Hypertensive heart disease with heart failure: Secondary | ICD-10-CM | POA: Diagnosis present

## 2022-09-06 DIAGNOSIS — Z811 Family history of alcohol abuse and dependence: Secondary | ICD-10-CM

## 2022-09-06 DIAGNOSIS — I252 Old myocardial infarction: Secondary | ICD-10-CM

## 2022-09-06 DIAGNOSIS — I509 Heart failure, unspecified: Secondary | ICD-10-CM | POA: Diagnosis not present

## 2022-09-06 DIAGNOSIS — Z803 Family history of malignant neoplasm of breast: Secondary | ICD-10-CM

## 2022-09-06 DIAGNOSIS — Z7901 Long term (current) use of anticoagulants: Secondary | ICD-10-CM

## 2022-09-06 DIAGNOSIS — I739 Peripheral vascular disease, unspecified: Secondary | ICD-10-CM | POA: Diagnosis present

## 2022-09-06 DIAGNOSIS — D6489 Other specified anemias: Secondary | ICD-10-CM | POA: Diagnosis present

## 2022-09-06 DIAGNOSIS — I480 Paroxysmal atrial fibrillation: Secondary | ICD-10-CM | POA: Diagnosis present

## 2022-09-06 HISTORY — DX: Sleep apnea, unspecified: G47.30

## 2022-09-06 HISTORY — PX: KNEE ARTHROPLASTY: SHX992

## 2022-09-06 HISTORY — DX: Family history of other specified conditions: Z84.89

## 2022-09-06 SURGERY — ARTHROPLASTY, KNEE, TOTAL, USING IMAGELESS COMPUTER-ASSISTED NAVIGATION
Anesthesia: Monitor Anesthesia Care | Site: Knee | Laterality: Left

## 2022-09-06 MED ORDER — KETOROLAC TROMETHAMINE 30 MG/ML IJ SOLN
INTRAMUSCULAR | Status: DC | PRN
  Administered 2022-09-06: 30 mg

## 2022-09-06 MED ORDER — PANTOPRAZOLE SODIUM 40 MG PO TBEC
40.0000 mg | DELAYED_RELEASE_TABLET | Freq: Every day | ORAL | Status: DC
  Administered 2022-09-07 – 2022-09-09 (×3): 40 mg via ORAL
  Filled 2022-09-06 (×3): qty 1

## 2022-09-06 MED ORDER — ONDANSETRON HCL 4 MG PO TABS: 4.0000 mg | ORAL_TABLET | Freq: Four times a day (QID) | ORAL | Status: DC | PRN

## 2022-09-06 MED ORDER — BUPIVACAINE-EPINEPHRINE 0.25% -1:200000 IJ SOLN
INTRAMUSCULAR | Status: DC | PRN
  Administered 2022-09-06: 30 mL

## 2022-09-06 MED ORDER — ACETAMINOPHEN 10 MG/ML IV SOLN: 1000.0000 mg | Freq: Once | INTRAVENOUS | Status: DC | PRN

## 2022-09-06 MED ORDER — METHOCARBAMOL 500 MG PO TABS
500.0000 mg | ORAL_TABLET | Freq: Four times a day (QID) | ORAL | Status: DC | PRN
  Administered 2022-09-07 – 2022-09-09 (×5): 500 mg via ORAL
  Filled 2022-09-06 (×5): qty 1

## 2022-09-06 MED ORDER — DOCUSATE SODIUM 100 MG PO CAPS
100.0000 mg | ORAL_CAPSULE | Freq: Two times a day (BID) | ORAL | Status: DC
  Administered 2022-09-06 – 2022-09-09 (×5): 100 mg via ORAL
  Filled 2022-09-06 (×5): qty 1

## 2022-09-06 MED ORDER — SERTRALINE HCL 50 MG PO TABS
50.0000 mg | ORAL_TABLET | Freq: Every day | ORAL | Status: DC
  Administered 2022-09-07 – 2022-09-09 (×3): 50 mg via ORAL
  Filled 2022-09-06 (×3): qty 1

## 2022-09-06 MED ORDER — LACTATED RINGERS IV SOLN: INTRAVENOUS | Status: DC

## 2022-09-06 MED ORDER — ONDANSETRON HCL 4 MG/2ML IJ SOLN
4.0000 mg | Freq: Four times a day (QID) | INTRAMUSCULAR | Status: DC | PRN
  Administered 2022-09-08: 4 mg via INTRAVENOUS
  Filled 2022-09-06: qty 2

## 2022-09-06 MED ORDER — ISOPROPYL ALCOHOL 70 % SOLN
Status: DC | PRN
  Administered 2022-09-06: 1 via TOPICAL

## 2022-09-06 MED ORDER — VANCOMYCIN HCL 1000 MG IV SOLR
INTRAVENOUS | Status: DC | PRN
Start: 2022-09-06 — End: 2022-09-06
  Administered 2022-09-06: 1000 mg via TOPICAL

## 2022-09-06 MED ORDER — SODIUM CHLORIDE 0.9 % IV SOLN: INTRAVENOUS | Status: DC

## 2022-09-06 MED ORDER — ATORVASTATIN CALCIUM 40 MG PO TABS
80.0000 mg | ORAL_TABLET | Freq: Every day | ORAL | Status: DC
  Administered 2022-09-07 – 2022-09-09 (×3): 80 mg via ORAL
  Filled 2022-09-06 (×3): qty 2

## 2022-09-06 MED ORDER — FENTANYL CITRATE PF 50 MCG/ML IJ SOSY
50.0000 ug | PREFILLED_SYRINGE | Freq: Once | INTRAMUSCULAR | Status: AC
  Administered 2022-09-06: 50 ug via INTRAVENOUS
  Filled 2022-09-06: qty 2

## 2022-09-06 MED ORDER — ORAL CARE MOUTH RINSE: 15.0000 mL | Freq: Once | OROMUCOSAL | Status: AC

## 2022-09-06 MED ORDER — MENTHOL 3 MG MT LOZG: 1.0000 | LOZENGE | OROMUCOSAL | Status: DC | PRN

## 2022-09-06 MED ORDER — OXYCODONE HCL 5 MG PO TABS: 5.0000 mg | ORAL_TABLET | Freq: Once | ORAL | Status: DC | PRN

## 2022-09-06 MED ORDER — METOCLOPRAMIDE HCL 5 MG PO TABS: 5.0000 mg | ORAL_TABLET | Freq: Three times a day (TID) | ORAL | Status: DC | PRN

## 2022-09-06 MED ORDER — VANCOMYCIN HCL 1000 MG IV SOLR
INTRAVENOUS | Status: AC
  Filled 2022-09-06: qty 20

## 2022-09-06 MED ORDER — HYDROMORPHONE HCL 1 MG/ML IJ SOLN
0.5000 mg | INTRAMUSCULAR | Status: DC | PRN
  Filled 2022-09-06: qty 1

## 2022-09-06 MED ORDER — DEXAMETHASONE SODIUM PHOSPHATE 10 MG/ML IJ SOLN
INTRAMUSCULAR | Status: AC
  Filled 2022-09-06: qty 1

## 2022-09-06 MED ORDER — SODIUM CHLORIDE (PF) 0.9 % IJ SOLN
INTRAMUSCULAR | Status: DC | PRN
  Administered 2022-09-06: 30 mL

## 2022-09-06 MED ORDER — TESTOSTERONE UNDECANOATE 237 MG PO CAPS: 237.0000 mg | ORAL_CAPSULE | Freq: Two times a day (BID) | ORAL | Status: DC

## 2022-09-06 MED ORDER — OXYCODONE HCL 5 MG PO TABS
5.0000 mg | ORAL_TABLET | ORAL | Status: DC | PRN
  Administered 2022-09-06 – 2022-09-08 (×3): 10 mg via ORAL
  Filled 2022-09-06 (×3): qty 2
  Filled 2022-09-06: qty 1

## 2022-09-06 MED ORDER — POVIDONE-IODINE 10 % EX SWAB: 2.0000 | Freq: Once | CUTANEOUS | Status: DC

## 2022-09-06 MED ORDER — ACETAMINOPHEN 500 MG PO TABS
1000.0000 mg | ORAL_TABLET | Freq: Four times a day (QID) | ORAL | Status: AC
  Administered 2022-09-06 – 2022-09-07 (×4): 1000 mg via ORAL
  Filled 2022-09-06 (×4): qty 2

## 2022-09-06 MED ORDER — METOCLOPRAMIDE HCL 5 MG/ML IJ SOLN: 5.0000 mg | Freq: Three times a day (TID) | INTRAMUSCULAR | Status: DC | PRN

## 2022-09-06 MED ORDER — ONDANSETRON HCL 4 MG/2ML IJ SOLN
INTRAMUSCULAR | Status: DC | PRN
  Administered 2022-09-06: 44 mg via INTRAVENOUS

## 2022-09-06 MED ORDER — ACETAMINOPHEN 500 MG PO TABS: 1000.0000 mg | ORAL_TABLET | Freq: Once | ORAL | Status: DC | PRN

## 2022-09-06 MED ORDER — CEFAZOLIN SODIUM-DEXTROSE 2-4 GM/100ML-% IV SOLN
2.0000 g | Freq: Four times a day (QID) | INTRAVENOUS | Status: AC
  Administered 2022-09-06 (×2): 2 g via INTRAVENOUS
  Filled 2022-09-06 (×2): qty 100

## 2022-09-06 MED ORDER — FENTANYL CITRATE PF 50 MCG/ML IJ SOSY: 25.0000 ug | PREFILLED_SYRINGE | INTRAMUSCULAR | Status: DC | PRN

## 2022-09-06 MED ORDER — ALPRAZOLAM 0.5 MG PO TABS
0.5000 mg | ORAL_TABLET | Freq: Every evening | ORAL | Status: DC | PRN
  Administered 2022-09-06: 0.5 mg via ORAL
  Filled 2022-09-06: qty 1

## 2022-09-06 MED ORDER — PROPOFOL 500 MG/50ML IV EMUL
INTRAVENOUS | Status: DC | PRN
  Administered 2022-09-06: 100 ug/kg/min via INTRAVENOUS

## 2022-09-06 MED ORDER — POTASSIUM CHLORIDE CRYS ER 20 MEQ PO TBCR
20.0000 meq | EXTENDED_RELEASE_TABLET | Freq: Every day | ORAL | Status: DC
  Administered 2022-09-07 – 2022-09-09 (×3): 20 meq via ORAL
  Filled 2022-09-06 (×3): qty 1

## 2022-09-06 MED ORDER — BUPIVACAINE-EPINEPHRINE (PF) 0.5% -1:200000 IJ SOLN
INTRAMUSCULAR | Status: DC | PRN
Start: 2022-09-06 — End: 2022-09-06
  Administered 2022-09-06: 20 mL via PERINEURAL

## 2022-09-06 MED ORDER — POVIDONE-IODINE 10 % EX SWAB
2.0000 | Freq: Once | CUTANEOUS | Status: AC
  Administered 2022-09-06: 2 via TOPICAL

## 2022-09-06 MED ORDER — ONDANSETRON HCL 4 MG/2ML IJ SOLN
INTRAMUSCULAR | Status: AC
  Filled 2022-09-06: qty 2

## 2022-09-06 MED ORDER — FUROSEMIDE 40 MG PO TABS
40.0000 mg | ORAL_TABLET | Freq: Every day | ORAL | Status: DC
  Administered 2022-09-07 – 2022-09-09 (×3): 40 mg via ORAL
  Filled 2022-09-06 (×3): qty 1

## 2022-09-06 MED ORDER — ACETAMINOPHEN 500 MG PO TABS
1000.0000 mg | ORAL_TABLET | Freq: Once | ORAL | Status: AC
  Administered 2022-09-06: 1000 mg via ORAL
  Filled 2022-09-06: qty 2

## 2022-09-06 MED ORDER — CHLORHEXIDINE GLUCONATE 0.12 % MT SOLN
15.0000 mL | Freq: Once | OROMUCOSAL | Status: AC
  Administered 2022-09-06: 15 mL via OROMUCOSAL

## 2022-09-06 MED ORDER — BUPIVACAINE IN DEXTROSE 0.75-8.25 % IT SOLN
INTRATHECAL | Status: DC | PRN
  Administered 2022-09-06: 2 mL via INTRATHECAL

## 2022-09-06 MED ORDER — DEXAMETHASONE SODIUM PHOSPHATE 10 MG/ML IJ SOLN
INTRAMUSCULAR | Status: DC | PRN
  Administered 2022-09-06: 5 mg via INTRAVENOUS

## 2022-09-06 MED ORDER — PROPOFOL 1000 MG/100ML IV EMUL
INTRAVENOUS | Status: AC
  Filled 2022-09-06: qty 100

## 2022-09-06 MED ORDER — PROPOFOL 10 MG/ML IV BOLUS
INTRAVENOUS | Status: DC | PRN
  Administered 2022-09-06 (×2): 30 mg via INTRAVENOUS

## 2022-09-06 MED ORDER — LATANOPROST 0.005 % OP SOLN
1.0000 [drp] | Freq: Every day | OPHTHALMIC | Status: DC
  Administered 2022-09-07 – 2022-09-09 (×3): 1 [drp] via OPHTHALMIC
  Filled 2022-09-06: qty 2.5

## 2022-09-06 MED ORDER — TESTOSTERONE 1.62 % TD GEL: 2.0000 | Freq: Every day | TRANSDERMAL | Status: DC

## 2022-09-06 MED ORDER — OXYCODONE HCL 5 MG PO TABS
10.0000 mg | ORAL_TABLET | ORAL | Status: DC | PRN
  Administered 2022-09-06 – 2022-09-07 (×3): 15 mg via ORAL
  Administered 2022-09-07: 10 mg via ORAL
  Administered 2022-09-08: 15 mg via ORAL
  Filled 2022-09-06: qty 2
  Filled 2022-09-06 (×3): qty 3
  Filled 2022-09-06: qty 2

## 2022-09-06 MED ORDER — MIDAZOLAM HCL 2 MG/2ML IJ SOLN
1.0000 mg | Freq: Once | INTRAMUSCULAR | Status: DC
  Filled 2022-09-06: qty 2

## 2022-09-06 MED ORDER — SODIUM CHLORIDE 0.9 % IR SOLN
Status: DC | PRN
  Administered 2022-09-06: 1000 mL
  Administered 2022-09-06: 3000 mL

## 2022-09-06 MED ORDER — POLYETHYLENE GLYCOL 3350 17 G PO PACK: 17.0000 g | PACK | Freq: Every day | ORAL | Status: DC | PRN

## 2022-09-06 MED ORDER — FUROSEMIDE 20 MG PO TABS
20.0000 mg | ORAL_TABLET | Freq: Every day | ORAL | Status: DC
  Administered 2022-09-06 – 2022-09-08 (×2): 20 mg via ORAL
  Filled 2022-09-06 (×2): qty 1

## 2022-09-06 MED ORDER — ALUM & MAG HYDROXIDE-SIMETH 200-200-20 MG/5ML PO SUSP: 30.0000 mL | ORAL | Status: DC | PRN

## 2022-09-06 MED ORDER — SPIRONOLACTONE 25 MG PO TABS
25.0000 mg | ORAL_TABLET | Freq: Every day | ORAL | Status: DC
  Administered 2022-09-07 – 2022-09-09 (×3): 25 mg via ORAL
  Filled 2022-09-06 (×3): qty 1

## 2022-09-06 MED ORDER — METOPROLOL SUCCINATE ER 25 MG PO TB24
25.0000 mg | ORAL_TABLET | Freq: Two times a day (BID) | ORAL | Status: DC
  Administered 2022-09-06 – 2022-09-09 (×5): 25 mg via ORAL
  Filled 2022-09-06 (×5): qty 1

## 2022-09-06 MED ORDER — BISACODYL 10 MG RE SUPP: 10.0000 mg | Freq: Every day | RECTAL | Status: DC | PRN

## 2022-09-06 MED ORDER — METHOCARBAMOL 1000 MG/10ML IJ SOLN: 500.0000 mg | Freq: Four times a day (QID) | INTRAVENOUS | Status: DC | PRN

## 2022-09-06 MED ORDER — SENNA 8.6 MG PO TABS
1.0000 | ORAL_TABLET | Freq: Two times a day (BID) | ORAL | Status: DC
  Administered 2022-09-06 – 2022-09-09 (×5): 8.6 mg via ORAL
  Filled 2022-09-06 (×5): qty 1

## 2022-09-06 MED ORDER — SODIUM CHLORIDE (PF) 0.9 % IJ SOLN
INTRAMUSCULAR | Status: AC
  Filled 2022-09-06: qty 50

## 2022-09-06 MED ORDER — CEFAZOLIN SODIUM-DEXTROSE 2-4 GM/100ML-% IV SOLN
2.0000 g | INTRAVENOUS | Status: AC
  Administered 2022-09-06: 2 g via INTRAVENOUS
  Filled 2022-09-06: qty 100

## 2022-09-06 MED ORDER — PHENYLEPHRINE HCL-NACL 20-0.9 MG/250ML-% IV SOLN
INTRAVENOUS | Status: DC | PRN
  Administered 2022-09-06: 35 ug/min via INTRAVENOUS

## 2022-09-06 MED ORDER — COLCHICINE 0.6 MG PO TABS
0.6000 mg | ORAL_TABLET | Freq: Every day | ORAL | Status: DC
  Administered 2022-09-07 – 2022-09-09 (×3): 0.6 mg via ORAL
  Filled 2022-09-06 (×3): qty 1

## 2022-09-06 MED ORDER — OXYCODONE HCL 5 MG/5ML PO SOLN: 5.0000 mg | Freq: Once | ORAL | Status: DC | PRN

## 2022-09-06 MED ORDER — DIPHENHYDRAMINE HCL 12.5 MG/5ML PO ELIX
12.5000 mg | ORAL_SOLUTION | ORAL | Status: DC | PRN
  Filled 2022-09-06: qty 10

## 2022-09-06 MED ORDER — STERILE WATER FOR IRRIGATION IR SOLN
Status: DC | PRN
  Administered 2022-09-06: 2000 mL

## 2022-09-06 MED ORDER — KETOROLAC TROMETHAMINE 30 MG/ML IJ SOLN
INTRAMUSCULAR | Status: AC
  Filled 2022-09-06: qty 1

## 2022-09-06 MED ORDER — ACETAMINOPHEN 325 MG PO TABS
325.0000 mg | ORAL_TABLET | Freq: Four times a day (QID) | ORAL | Status: DC | PRN
  Administered 2022-09-08 – 2022-09-09 (×2): 650 mg via ORAL
  Filled 2022-09-06 (×3): qty 2

## 2022-09-06 MED ORDER — ACETAMINOPHEN 160 MG/5ML PO SOLN: 1000.0000 mg | Freq: Once | ORAL | Status: DC | PRN

## 2022-09-06 MED ORDER — ALLOPURINOL 100 MG PO TABS
100.0000 mg | ORAL_TABLET | Freq: Every day | ORAL | Status: DC
  Administered 2022-09-07 – 2022-09-09 (×3): 100 mg via ORAL
  Filled 2022-09-06 (×3): qty 1

## 2022-09-06 MED ORDER — PHENOL 1.4 % MT LIQD: 1.0000 | OROMUCOSAL | Status: DC | PRN

## 2022-09-06 MED ORDER — TRAZODONE HCL 50 MG PO TABS
25.0000 mg | ORAL_TABLET | Freq: Every evening | ORAL | Status: DC | PRN
  Administered 2022-09-06 – 2022-09-08 (×2): 50 mg via ORAL
  Filled 2022-09-06 (×2): qty 1

## 2022-09-06 MED ORDER — BUPIVACAINE-EPINEPHRINE 0.25% -1:200000 IJ SOLN
INTRAMUSCULAR | Status: AC
  Filled 2022-09-06: qty 1

## 2022-09-06 MED ORDER — APIXABAN 2.5 MG PO TABS
2.5000 mg | ORAL_TABLET | Freq: Two times a day (BID) | ORAL | Status: DC
  Administered 2022-09-07 – 2022-09-09 (×5): 2.5 mg via ORAL
  Filled 2022-09-06 (×5): qty 1

## 2022-09-06 MED ORDER — TRANEXAMIC ACID-NACL 1000-0.7 MG/100ML-% IV SOLN
1000.0000 mg | INTRAVENOUS | Status: AC
  Administered 2022-09-06: 1000 mg via INTRAVENOUS
  Filled 2022-09-06: qty 100

## 2022-09-06 SURGICAL SUPPLY — 67 items
ADH SKN CLS APL DERMABOND .7 (GAUZE/BANDAGES/DRESSINGS) ×2
APL PRP STRL LF DISP 70% ISPRP (MISCELLANEOUS) ×2
BATTERY INSTRU NAVIGATION (MISCELLANEOUS) ×3 IMPLANT
BLADE SAW RECIPROCATING 77.5 (BLADE) ×1 IMPLANT
BNDG CMPR 5X4 KNIT ELC UNQ LF (GAUZE/BANDAGES/DRESSINGS) ×1
BNDG CMPR 6 X 5 YARDS HK CLSR (GAUZE/BANDAGES/DRESSINGS) ×1
BNDG ELASTIC 4INX 5YD STR LF (GAUZE/BANDAGES/DRESSINGS) ×1 IMPLANT
BNDG ELASTIC 6INX 5YD STR LF (GAUZE/BANDAGES/DRESSINGS) ×1 IMPLANT
BTRY SRG DRVR LF (MISCELLANEOUS) ×3
CHLORAPREP W/TINT 26 (MISCELLANEOUS) ×2 IMPLANT
COMP FEM PS KNEE STD 10 LT (Knees) ×1 IMPLANT
COMP PATELLA 3 PEG 35 (Joint) ×1 IMPLANT
COMP TIB KNEE PS 0D LT (Joint) ×1 IMPLANT
COMPONENT FEM PS KN STD 10 LT (Knees) IMPLANT
COMPONENT PATELLA 3 PEG 35 (Joint) IMPLANT
COMPONENT TIB KNEE PS 0D LT (Joint) IMPLANT
COVER SURGICAL LIGHT HANDLE (MISCELLANEOUS) ×1 IMPLANT
DERMABOND ADVANCED .7 DNX12 (GAUZE/BANDAGES/DRESSINGS) IMPLANT
DRAPE SHEET LG 3/4 BI-LAMINATE (DRAPES) ×3 IMPLANT
DRAPE U-SHAPE 47X51 STRL (DRAPES) ×1 IMPLANT
DRSG AQUACEL AG ADV 3.5X10 (GAUZE/BANDAGES/DRESSINGS) ×1 IMPLANT
ELECT BLADE TIP CTD 4 INCH (ELECTRODE) ×1 IMPLANT
ELECT REM PT RETURN 15FT ADLT (MISCELLANEOUS) ×1 IMPLANT
GAUZE SPONGE 4X4 12PLY STRL (GAUZE/BANDAGES/DRESSINGS) ×1 IMPLANT
GLOVE BIO SURGEON STRL SZ7 (GLOVE) ×1 IMPLANT
GLOVE BIO SURGEON STRL SZ8.5 (GLOVE) ×2 IMPLANT
GLOVE BIOGEL PI IND STRL 7.5 (GLOVE) ×1 IMPLANT
GLOVE BIOGEL PI IND STRL 8.5 (GLOVE) ×1 IMPLANT
GOWN SPEC L3 XXLG W/TWL (GOWN DISPOSABLE) ×1 IMPLANT
GOWN STRL REUS W/ TWL XL LVL3 (GOWN DISPOSABLE) ×1 IMPLANT
GOWN STRL REUS W/TWL XL LVL3 (GOWN DISPOSABLE) ×1
HANDPIECE INTERPULSE COAX TIP (DISPOSABLE) ×1
HOLDER FOLEY CATH W/STRAP (MISCELLANEOUS) ×1 IMPLANT
HOOD PEEL AWAY T7 (MISCELLANEOUS) ×3 IMPLANT
IMPL TAPESTRY BIOINTEGR 40X30 (Mesh General) IMPLANT
KIT TURNOVER KIT A (KITS) IMPLANT
LINER TIB FB EF/3-11 11 LT (Liner) IMPLANT
MARKER SKIN DUAL TIP RULER LAB (MISCELLANEOUS) ×1 IMPLANT
NDL SAFETY ECLIP 18X1.5 (MISCELLANEOUS) ×1 IMPLANT
NDL SPNL 18GX3.5 QUINCKE PK (NEEDLE) ×1 IMPLANT
NEEDLE SPNL 18GX3.5 QUINCKE PK (NEEDLE) ×1
NS IRRIG 1000ML POUR BTL (IV SOLUTION) ×1 IMPLANT
PACK TOTAL KNEE CUSTOM (KITS) ×1 IMPLANT
PADDING CAST COTTON 6X4 STRL (CAST SUPPLIES) ×1 IMPLANT
PIN DRILL HDLS TROCAR 75 4PK (PIN) IMPLANT
PROTECTOR NERVE ULNAR (MISCELLANEOUS) ×1 IMPLANT
SAW OSC TIP CART 19.5X105X1.3 (SAW) ×1 IMPLANT
SCREW FEMALE HEX FIX 25X2.5 (ORTHOPEDIC DISPOSABLE SUPPLIES) IMPLANT
SEALER BIPOLAR AQUA 6.0 (INSTRUMENTS) ×1 IMPLANT
SET HNDPC FAN SPRY TIP SCT (DISPOSABLE) ×1 IMPLANT
SET PAD KNEE POSITIONER (MISCELLANEOUS) ×1 IMPLANT
SOLUTION PRONTOSAN WOUND 350ML (IRRIGATION / IRRIGATOR) IMPLANT
STAPLER VISISTAT 35W (STAPLE) IMPLANT
SUT MNCRL AB 3-0 PS2 18 (SUTURE) ×1 IMPLANT
SUT MON AB 2-0 CT1 36 (SUTURE) ×1 IMPLANT
SUT STRATAFIX PDO 1 14 VIOLET (SUTURE) ×1
SUT STRATFX PDO 1 14 VIOLET (SUTURE) ×1
SUT VIC AB 1 CTX 36 (SUTURE) ×2
SUT VIC AB 1 CTX36XBRD ANBCTR (SUTURE) ×2 IMPLANT
SUT VIC AB 2-0 CT1 27 (SUTURE) ×1
SUT VIC AB 2-0 CT1 TAPERPNT 27 (SUTURE) ×1 IMPLANT
SUTURE STRATFX PDO 1 14 VIOLET (SUTURE) ×1 IMPLANT
SYR 3ML LL SCALE MARK (SYRINGE) ×1 IMPLANT
TRAY FOLEY MTR SLVR 16FR STAT (SET/KITS/TRAYS/PACK) IMPLANT
TUBE SUCTION HIGH CAP CLEAR NV (SUCTIONS) ×1 IMPLANT
WATER STERILE IRR 1000ML POUR (IV SOLUTION) ×2 IMPLANT
WRAP KNEE MAXI GEL POST OP (GAUZE/BANDAGES/DRESSINGS) IMPLANT

## 2022-09-06 NOTE — Discharge Instructions (Addendum)
 Dr. Brian Swinteck Total Joint Specialist [facility] [address] [place], [place] [address] [phone]  TOTAL KNEE REPLACEMENT POSTOPERATIVE DIRECTIONS    Knee Rehabilitation, Guidelines Following Surgery  Results after knee surgery are often greatly improved when you follow the exercise, range of motion and muscle strengthening exercises prescribed by your doctor. Safety measures are also important to protect the knee from further injury. Any time any of these exercises cause you to have increased pain or swelling in your knee joint, decrease the amount until you are comfortable again and slowly increase them. If you have problems or questions, call your caregiver or physical therapist for advice.   WEIGHT BEARING Weight bearing as tolerated with assist device (walker, cane, etc) as directed, use it as long as suggested by your surgeon or therapist, typically at least 4-6 weeks.  HOME CARE INSTRUCTIONS  Remove items at home which could result in a fall. This includes throw rugs or furniture in walking pathways.  Continue medications as instructed at time of discharge. You may have some home medications which will be placed on hold until you complete the course of blood thinner medication.  You may start showering once you are discharged home but do not submerge the incision under water. Just pat the incision dry and apply a dry gauze dressing on daily. Walk with walker as instructed.  You may resume a sexual relationship in one month or when given the OK by your doctor.  Use walker as long as suggested by your caregivers. Avoid periods of inactivity such as sitting longer than an hour when not asleep. This helps prevent blood clots.  You may put full weight on your legs and walk as much as is comfortable.  You may return to work once you are cleared by your doctor.  Do not drive a car for 6 weeks or until released by you surgeon.  Do not drive while  taking narcotics.  Wear the elastic stockings for three weeks following surgery during the day but you may remove then at night. Make sure you keep all of your appointments after your operation with all of your doctors and caregivers. You should call the office at the above phone number and make an appointment for approximately two weeks after the date of your surgery. Do not remove your surgical dressing. The dressing is waterproof; you may take showers in 3 days, but do not take tub baths or submerge the dressing. Please pick up a stool softener and laxative for home use as long as you are requiring pain medications. ICE to the affected knee every three hours for 30 minutes at a time and then as needed for pain and swelling.  Continue to use ice on the knee for pain and swelling from surgery. You may notice swelling that will progress down to the foot and ankle.  This is normal after surgery.  Elevate the leg when you are not up walking on it.   It is important for you to complete the blood thinner medication as prescribed by your doctor. Continue to use the breathing machine which will help keep your temperature down.  It is common for your temperature to cycle up and down following surgery, especially at night when you are not up moving around and exerting yourself.  The breathing machine keeps your lungs expanded and your temperature down.  RANGE OF MOTION AND STRENGTHENING EXERCISES  Rehabilitation of the knee is important following a knee injury or an   operation. After just a few days of immobilization, the muscles of the thigh which control the knee become weakened and shrink (atrophy). Knee exercises are designed to build up the tone and strength of the thigh muscles and to improve knee motion. Often times heat used for twenty to thirty minutes before working out will loosen up your tissues and help with improving the range of motion but do not use heat for the first two weeks following surgery.  These exercises can be done on a training (exercise) mat, on the floor, on a table or on a bed. Use what ever works the best and is most comfortable for you Knee exercises include:  Leg Lifts - While your knee is still immobilized in a splint or cast, you can do straight leg raises. Lift the leg to 60 degrees, hold for 3 sec, and slowly lower the leg. Repeat 10-20 times 2-3 times daily. Perform this exercise against resistance later as your knee gets better.  Quad and Hamstring Sets - Tighten up the muscle on the front of the thigh (Quad) and hold for 5-10 sec. Repeat this 10-20 times hourly. Hamstring sets are done by pushing the foot backward against an object and holding for 5-10 sec. Repeat as with quad sets.  A rehabilitation program following serious knee injuries can speed recovery and prevent re-injury in the future due to weakened muscles. Contact your doctor or a physical therapist for more information on knee rehabilitation.   POST-OPERATIVE OPIOID TAPER INSTRUCTIONS: It is important to wean off of your opioid medication as soon as possible. If you do not need pain medication after your surgery it is ok to stop day one. Opioids include: Codeine, Hydrocodone(Norco, Vicodin), Oxycodone(Percocet, oxycontin) and hydromorphone amongst others.  Long term and even short term use of opiods can cause: Increased pain response Dependence Constipation Depression Respiratory depression And more.  Withdrawal symptoms can include Flu like symptoms Nausea, vomiting And more Techniques to manage these symptoms Hydrate well Eat regular healthy meals Stay active Use relaxation techniques(deep breathing, meditating, yoga) Do Not substitute Alcohol to help with tapering If you have been on opioids for less than two weeks and do not have pain than it is ok to stop all together.  Plan to wean off of opioids This plan should start within one week post op of your joint replacement. Maintain the same  interval or time between taking each dose and first decrease the dose.  Cut the total daily intake of opioids by one tablet each day Next start to increase the time between doses. The last dose that should be eliminated is the evening dose.    SKILLED REHAB INSTRUCTIONS: If the patient is transferred to a skilled rehab facility following release from the hospital, a list of the current medications will be sent to the facility for the patient to continue.  When discharged from the skilled rehab facility, please have the facility set up the patient's Home Health Physical Therapy prior to being released. Also, the skilled facility will be responsible for providing the patient with their medications at time of release from the facility to include their pain medication, the muscle relaxants, and their blood thinner medication. If the patient is still at the rehab facility at time of the two week follow up appointment, the skilled rehab facility will also need to assist the patient in arranging follow up appointment in our office and any transportation needs.  MAKE SURE YOU:  Understand these instructions.  Will watch   your condition.  Will get help right away if you are not doing well or get worse.    Pick up stool softner and laxative for home use following surgery while on pain medications. Do NOT remove your dressing. You may shower.  Do not take tub baths or submerge incision under water. May shower starting three days after surgery. Please use a clean towel to pat the incision dry following showers. Continue to use ice for pain and swelling after surgery. Do not use any lotions or creams on the incision until instructed by your surgeon.  

## 2022-09-06 NOTE — Care Plan (Signed)
Ortho Bundle Case Management Note  Patient Details  Name: CAETANO HERSHNER MRN: [id number] Date of Birth: 1950-12-09  L TKA on 09-06-22 DCP:  Home with wife DME:  RW ordered through [facility] PT:  [facility] on 09-10-22                   DME Arranged:  Dan Humphreys rolling DME Agency:  [employer]  HH Arranged:  NA HH Agency:  NA  Additional Comments: Please contact me with any questions of if this plan should need to change.  Ennis Forts, RN,CCM [facility]  [phone] 09/06/2022, 8:49 AM

## 2022-09-06 NOTE — Interval H&P Note (Signed)
History and Physical Interval Note:  09/06/2022 9:46 AM  Gregory [name]  has presented today for surgery, with the diagnosis of Left knee osteoarthritis.  The various methods of treatment have been discussed with the patient and family. After consideration of risks, benefits and other options for treatment, the patient has consented to  Procedure(s): COMPUTER ASSISTED TOTAL KNEE ARTHROPLASTY (Left) as a surgical intervention.  The patient's history has been reviewed, patient examined, no change in status, stable for surgery.  I have reviewed the patient's chart and labs.  Questions were answered to the patient's satisfaction.     Iline Oven Allesha Aronoff

## 2022-09-06 NOTE — Anesthesia Procedure Notes (Signed)
Spinal  Patient location during procedure: OR Start time: 09/06/2022 11:33 AM End time: 09/06/2022 11:38 AM Staffing Performed: resident/CRNA  Resident/CRNA: Vanessa [place], CRNA Performed by: Vanessa [place], CRNA Authorized by: Val Eagle, MD   Preanesthetic Checklist Completed: patient identified, IV checked, site marked, risks and benefits discussed, surgical consent, monitors and equipment checked, pre-op evaluation and timeout performed Spinal Block Patient position: sitting Prep: DuraPrep Patient monitoring: heart rate, blood pressure, continuous pulse ox and cardiac monitor Approach: midline Location: L3-4 Needle Needle type: Pencan  Needle gauge: 24 G Needle length: 10 cm Needle insertion depth: 9 cm Assessment Events: CSF return

## 2022-09-06 NOTE — Plan of Care (Signed)
  Problem: Activity: Goal: Ability to avoid complications of mobility impairment will improve Outcome: Progressing Goal: Range of joint motion will improve Outcome: Progressing   Problem: Pain Management: Goal: Pain level will decrease with appropriate interventions Outcome: Progressing   Problem: Pain Managment: Goal: General experience of comfort will improve Outcome: Progressing

## 2022-09-06 NOTE — Transfer of Care (Signed)
Immediate Anesthesia Transfer of Care Note  Patient: Gregory [name]  Procedure(s) Performed: TOTAL KNEE ARTHROPLASTY (Left: Knee)  Patient Location: PACU  Anesthesia Type:Spinal  Level of Consciousness: awake and patient cooperative  Airway & Oxygen Therapy: Patient Spontanous Breathing and Patient connected to face mask  Post-op Assessment: Report given to RN and Post -op Vital signs reviewed and stable  Post vital signs: Reviewed  Last Vitals:  Vitals Value Taken Time  BP 92/49 09/06/22 1354  Temp    Pulse 71 09/06/22 1356  Resp 10 09/06/22 1356  SpO2 94 % 09/06/22 1356  Vitals shown include unfiled device data.  Last Pain:  Vitals:   09/06/22 1100  TempSrc:   PainSc: 0-No pain         Complications: No notable events documented.

## 2022-09-06 NOTE — Anesthesia Procedure Notes (Signed)
Anesthesia Regional Block: Adductor canal block   Pre-Anesthetic Checklist: , timeout performed,  Correct Patient, Correct Site, Correct Laterality,  Correct Procedure, Correct Position, site marked,  Risks and benefits discussed,  Surgical consent,  Pre-op evaluation,  At surgeon's request and post-op pain management  Laterality: Left and Lower  Prep: chloraprep       Needles:  Injection technique: Single-shot      Needle Length: 9cm  Needle Gauge: 22     Additional Needles: Arrow StimuQuik ECHO Echogenic Stimulating PNB Needle  Procedures:,,,, ultrasound used (permanent image in chart),,    Narrative:  Start time: 09/06/2022 10:34 AM End time: 09/06/2022 10:40 AM Injection made incrementally with aspirations every 5 mL.  Performed by: Personally  Anesthesiologist: Val Eagle, MD

## 2022-09-06 NOTE — Op Note (Signed)
OPERATIVE REPORT  SURGEON: Samson Frederic, MD   ASSISTANT: Clint Bolder, PA-C  PREOPERATIVE DIAGNOSIS: Primary Left knee arthritis.   POSTOPERATIVE DIAGNOSIS: Primary Left knee arthritis.   PROCEDURE: Left total knee arthroplasty.   IMPLANTS: Zimmer Persona PPS Cementless CR femur, size 10. Persona 0 degree Spiked Keel OsseoTi Tibia, size F. Vivacit-E polyethelyene insert, size 11 mm, CR. OsseoTi 3-Peg patella, size 35 mm. Embody Tapestry BioIntegrative implant 40 x 30 mm.  ANESTHESIA:  MAC, Regional, and Spinal  TOURNIQUET TIME: Not utilized.   ESTIMATED BLOOD LOSS:-200 mL    ANTIBIOTICS: 2g Ancef.  DRAINS: None.  COMPLICATIONS: None   CONDITION: PACU - hemodynamically stable.   BRIEF CLINICAL NOTE: Gregory [name] is a 72 y.o. male with a long-standing history of Left knee arthritis. After failing conservative management, the patient was indicated for total knee arthroplasty. The risks, benefits, and alternatives to the procedure were explained, and the patient elected to proceed.  PROCEDURE IN DETAIL: Adductor canal block was obtained in the pre-op holding area. Once inside the operative room, spinal anesthesia was obtained, and a foley catheter was inserted. The patient was then positioned and the lower extremity was prepped and draped in the normal sterile surgical fashion.  A time-out was called verifying side and site of surgery. The patient received IV antibiotics within 60 minutes of beginning the procedure. A tourniquet was not utilized.   An anterior approach to the knee was performed utilizing a midvastus arthrotomy. A medial release was performed and the patellar fat pad was excised. An intramedullary guide was used to cut the distal femur perpendicular to the mechanical axis. A freehand patellar resection was performed, and the patella was sized and prepared with 3 lug holes.  An extramedullary cutting guide was used to make a neutral proximal tibia resection,  taking 9 mm of bone from the less affected lateral side with 3 degrees of slope. The menisci were excised. A spacer block was placed, and the alignment and balance in extension were confirmed.   The distal femur was sized using the 3-degree external rotation guide referencing the posterior femoral cortex. The appropriate 4-in-1 cutting block was pinned into place. Rotation was checked using Whiteside's line, the epicondylar axis, and then confirmed with a spacer block in flexion. The remaining femoral cuts were performed, taking care to protect the MCL.  The tibia was sized and the trial tray was pinned into place. The remaining trail components were inserted. The knee was stable to varus and valgus stress through a full range of motion. The patella tracked centrally, and the PCL was well balanced. The trial components were removed, and the proximal tibial surface was prepared. Final components were impacted into place. The knee was tested for a final time and found to be well balanced.   The wound was copiously irrigated with Prontosan solution and normal saline using pulse lavage.  Marcaine solution was injected into the periarticular soft tissue.  The wound was closed in layers using #1 Vicryl and Stratafix for the fascia (which was augmented with Tapestry BioIntegrative implant), 2-0 Vicryl for the subcutaneous fat, 2-0 Monocryl for the deep dermal layer, and staples + Dermabond for the skin.  Once the glue was fully dried, an Aquacell Ag and compressive dressing were applied.  The patient was transported to the recovery room in stable condition.  Sponge, needle, and instrument counts were correct at the end of the case x2.  The patient tolerated the procedure well and there were no known  complications.  The aquamantis was utilized for this case to help facilitate better hemostasis as patient was felt to be at increased risk of bleeding because of preop anemia and complex case requiring increased OR time  and/or exposure.  -minimally invasive approach.  A oscillating saw tip was utilized for this case to prevent damage to the soft tissue structures such as muscles, ligaments and tendons, and to ensure accurate bone cuts. This patient was at increased risk for above structures due to  minimally invasive approach.  Please note that a surgical assistant was a medical necessity for this procedure in order to perform it in a safe and expeditious manner. Surgical assistant was necessary to retract the ligaments and vital neurovascular structures to prevent injury to them and also necessary for proper positioning of the limb to allow for anatomic placement of the prosthesis.

## 2022-09-06 NOTE — Anesthesia Procedure Notes (Signed)
Procedure Name: MAC Date/Time: 09/06/2022 11:35 AM  Performed by: Vanessa Burdett, CRNAPre-anesthesia Checklist: Patient identified, Emergency Drugs available, Suction available and Patient being monitored Patient Re-evaluated:Patient Re-evaluated prior to induction Oxygen Delivery Method: Simple face mask

## 2022-09-06 NOTE — Evaluation (Signed)
Physical Therapy Evaluation Patient Details Name: Gregory [name] MRN: [id number] DOB: [birth year] Today's Date: 09/06/2022  History of Present Illness  72 yo male presents to therapy s/p L TKA on 09/06/2022 due to failure of conservative measures. Pt PMH includes but is not limited to: PAF, PVD, pacemaker, ICD in situ, anemia, gout, HLD, CAD, s/p CABG x 3, s/p aortic valve replacement, and HTN.  Clinical Impression     Gregory [name] is a 72 y.o. male POD 0 s/p L TKA. Patient reports mod I with mobility at baseline. Patient is now limited by functional impairments (see PT problem list below) and requires CGA for bed mobility and CGA and cues for transfers. Patient was able to ambulate 55 feet with RW and CGA level of assist. Patient instructed in exercise to facilitate ROM and circulation to manage edema. Patient will benefit from continued skilled PT interventions to address impairments and progress towards PLOF. Acute PT will follow to progress mobility and stair training in preparation for safe discharge home with family support and OPPT services scheduled for 9/9.       If plan is discharge home, recommend the following: A little help with walking and/or transfers;A little help with bathing/dressing/bathroom;Assistance with cooking/housework;Help with stairs or ramp for entrance;Assist for transportation   Can travel by private vehicle        Equipment Recommendations Rolling walker (2 wheels)  Recommendations for Other Services       Functional Status Assessment Patient has had a recent decline in their functional status and demonstrates the ability to make significant improvements in function in a reasonable and predictable amount of time.     Precautions / Restrictions Precautions Precautions: Knee;ICD/Pacemaker;Fall Restrictions Weight Bearing Restrictions: No      Mobility  Bed Mobility Overal bed mobility: Needs Assistance Bed Mobility: Supine to Sit     Supine to  sit: Contact guard, HOB elevated     General bed mobility comments: min cues    Transfers Overall transfer level: Needs assistance Equipment used: Rolling walker (2 wheels) Transfers: Sit to/from Stand Sit to Stand: Contact guard assist, From elevated surface           General transfer comment: min cues for UE placement    Ambulation/Gait Ambulation/Gait assistance: Contact guard assist Gait Distance (Feet): 55 Feet Assistive device: Rolling walker (2 wheels) Gait Pattern/deviations: Step-to pattern, Decreased stance time - left, Antalgic, Trunk flexed Gait velocity: slightly decreased     General Gait Details: step almost through pattern, min cues for safety and proper distance from [employer]            Wheelchair Mobility     Tilt Bed    Modified Rankin (Stroke Patients Only)       Balance Overall balance assessment: Needs assistance Sitting-balance support: Feet supported Sitting balance-Leahy Scale: Good     Standing balance support: Bilateral upper extremity supported, During functional activity, Reliant on assistive device for balance Standing balance-Leahy Scale: Poor                               Pertinent Vitals/Pain Pain Assessment Pain Assessment: 0-10 Pain Score: 5  Pain Location: L knee Pain Descriptors / Indicators: Aching, Constant, Discomfort, Operative site guarding Pain Intervention(s): Limited activity within patient's tolerance, Monitored during session, Premedicated before session, Repositioned, Ice applied    Home Living Family/patient expects to be discharged to:: Private residence Living Arrangements: Spouse/significant  other Available Help at Discharge: Family Type of Home: House Home Access: Stairs to enter Entrance Stairs-Rails: Right Entrance Stairs-Number of Steps: 2   Home Layout: One level Home Equipment: Rollator (4 wheels);Cane - single point      Prior Function Prior Level of Function :  Independent/Modified Independent;Driving             Mobility Comments: mod I with SPC for all ADLs self care tasks and IADLs       Extremity/Trunk Assessment        Lower Extremity Assessment Lower Extremity Assessment: LLE deficits/detail LLE Deficits / Details: ankle DF 5/5 PF 3+/5, SLR < 10 degree lag LLE Sensation: decreased light touch (buttocks)    Cervical / Trunk Assessment Cervical / Trunk Assessment: Normal  Communication   Communication Communication: No apparent difficulties  Cognition Arousal: Alert Behavior During Therapy: WFL for tasks assessed/performed Overall Cognitive Status: Within Functional Limits for tasks assessed                                          General Comments      Exercises Total Joint Exercises Ankle Circles/Pumps: AROM, Both, 15 reps   Assessment/Plan    PT Assessment Patient needs continued PT services  PT Problem List Decreased strength;Decreased range of motion;Decreased activity tolerance;Decreased balance;Decreased mobility;Decreased coordination;Pain       PT Treatment Interventions DME instruction;Gait training;Stair training;Functional mobility training;Therapeutic activities;Therapeutic exercise;Balance training;Neuromuscular re-education;Patient/family education;Modalities    PT Goals (Current goals can be found in the Care Plan section)  Acute Rehab PT Goals Patient Stated Goal: gst strong enough to have the R TKA, ride my bike and amb on uneven surfaces PT Goal Formulation: With patient Time For Goal Achievement: 09/20/22 Potential to Achieve Goals: Good    Frequency 7X/week     Co-evaluation               AM-PAC PT "6 Clicks" Mobility  Outcome Measure Help needed turning from your back to your side while in a flat bed without using bedrails?: A Little Help needed moving from lying on your back to sitting on the side of a flat bed without using bedrails?: A Little Help needed  moving to and from a bed to a chair (including a wheelchair)?: A Little Help needed standing up from a chair using your arms (e.g., wheelchair or bedside chair)?: A Little Help needed to walk in hospital room?: A Little Help needed climbing 3-5 steps with a railing? : A Lot 6 Click Score: 17    End of Session Equipment Utilized During Treatment: Gait belt Activity Tolerance: Patient tolerated treatment well Patient left: in chair;with call bell/phone within reach;with family/visitor present Nurse Communication: Mobility status PT Visit Diagnosis: Unsteadiness on feet (R26.81);Other abnormalities of gait and mobility (R26.89);Muscle weakness (generalized) (M62.81);Difficulty in walking, not elsewhere classified (R26.2);Pain Pain - Right/Left: Left Pain - part of body: Knee;Leg    Time: 1610-9604 PT Time Calculation (min) (ACUTE ONLY): 31 min   Charges:   PT Evaluation $PT Eval Low Complexity: 1 Low PT Treatments $Gait Training: 8-22 mins PT General Charges $$ ACUTE PT VISIT: 1 Visit         Johnny Bridge, PT Acute Rehab   Jacqualyn Posey 09/06/2022, 6:29 PM

## 2022-09-07 ENCOUNTER — Encounter (HOSPITAL_COMMUNITY): Payer: Self-pay | Admitting: Orthopedic Surgery

## 2022-09-07 LAB — BASIC METABOLIC PANEL
Anion gap: 8 (ref 5–15)
BUN: 21 mg/dL (ref 8–23)
CO2: 25 mmol/L (ref 22–32)
Calcium: 7.9 mg/dL — ABNORMAL LOW (ref 8.9–10.3)
Chloride: 102 mmol/L (ref 98–111)
Creatinine, Ser: 1.17 mg/dL (ref 0.61–1.24)
GFR, Estimated: >60 mL/min (ref >60)
Glucose, Bld: 126 mg/dL — ABNORMAL HIGH (ref 70–99)
Potassium: 4.8 mmol/L (ref 3.5–5.1)
Sodium: 135 mmol/L (ref 135–145)

## 2022-09-07 LAB — CBC
HCT: 27.8 % — ABNORMAL LOW (ref 39.0–52.0)
Hemoglobin: 8.9 g/dL — ABNORMAL LOW (ref 13.0–17.0)
MCH: 35 pg — ABNORMAL HIGH (ref 26.0–34.0)
MCHC: 32 g/dL (ref 30.0–36.0)
MCV: 109.4 fL — ABNORMAL HIGH (ref 80.0–100.0)
Platelets: 141 10*3/uL — ABNORMAL LOW (ref 150–400)
RBC: 2.54 MIL/uL — ABNORMAL LOW (ref 4.22–5.81)
RDW: 13.2 % (ref 11.5–15.5)
WBC: 8.9 10*3/uL (ref 4.0–10.5)
nRBC: 0 % (ref 0.0–0.2)

## 2022-09-07 MED ORDER — ONDANSETRON HCL 4 MG PO TABS: 4.0000 mg | ORAL_TABLET | Freq: Three times a day (TID) | ORAL | 0 refills | Status: DC | PRN

## 2022-09-07 MED ORDER — POLYETHYLENE GLYCOL 3350 17 G PO PACK: 17.0000 g | PACK | Freq: Every day | ORAL | 0 refills | Status: AC | PRN

## 2022-09-07 MED ORDER — SENNA 8.6 MG PO TABS
2.0000 | ORAL_TABLET | Freq: Every day | ORAL | 0 refills | Status: AC
Start: 2022-09-07 — End: 2022-09-22

## 2022-09-07 MED ORDER — SULFAMETHOXAZOLE-TRIMETHOPRIM 800-160 MG PO TABS: 1.0000 | ORAL_TABLET | Freq: Two times a day (BID) | ORAL | 0 refills | Status: AC

## 2022-09-07 MED ORDER — DOCUSATE SODIUM 100 MG PO CAPS: 100.0000 mg | ORAL_CAPSULE | Freq: Two times a day (BID) | ORAL | 0 refills | Status: AC

## 2022-09-07 MED ORDER — OXYCODONE HCL 5 MG PO TABS: 5.0000 mg | ORAL_TABLET | ORAL | 0 refills | Status: DC | PRN

## 2022-09-07 NOTE — Care Management Obs Status (Signed)
MEDICARE OBSERVATION STATUS NOTIFICATION   Patient Details  Name: Gregory [name] MRN: [id number] Date of Birth: [birth year]   Medicare Observation Status Notification Given:  Yes    Amada Jupiter, LCSW 09/07/2022, 2:00 PM

## 2022-09-07 NOTE — TOC Transition Note (Signed)
Transition of Care [facility]) - CM/SW Discharge Note   Patient Details  Name: Gregory [name] MRN: [id number] Date of Birth: [birth year]  Transition of Care [facility]) CM/SW Contact:  Amada Jupiter, LCSW Phone Number: 09/07/2022, 2:01 PM   Clinical Narrative:     Met with pt and confirming he has received RW to room via [employer].  OPPT already arranged with [facility].  No further TOC needs.  Final next level of care: OP Rehab Barriers to Discharge: No Barriers Identified   Patient Goals and CMS Choice      Discharge Placement                         Discharge Plan and Services Additional resources added to the After Visit Summary for                  DME Arranged: Walker rolling DME Agency: [employer]       HH Arranged: NA HH Agency: NA        Social Determinants of Health (SDOH) Interventions SDOH Screenings   Food Insecurity: No Food Insecurity (09/06/2022)  Housing: Low Risk  (09/06/2022)  Transportation Needs: No Transportation Needs (09/06/2022)  Utilities: Not At Risk (09/06/2022)  Alcohol Screen: Low Risk  (12/20/2020)  Depression (PHQ2-9): Medium Risk (07/26/2022)  Financial Resource Strain: Low Risk  (12/22/2021)  Physical Activity: Sufficiently Active (12/22/2021)  Social Connections: Moderately Isolated (12/22/2021)  Stress: No Stress Concern Present (12/22/2021)  Tobacco Use: Low Risk  (09/06/2022)     Readmission Risk Interventions     No data to display

## 2022-09-07 NOTE — Progress Notes (Signed)
Physical Therapy Treatment Patient Details Name: Gregory [name] MRN: [id number] DOB: 04-Feb-1950 Today's Date: 09/07/2022   History of Present Illness 72 yo male presents to therapy s/p L TKA on 09/06/2022 due to failure of conservative measures. Pt PMH includes but is not limited to: PAF, PVD, pacemaker, ICD in situ, anemia, gout, HLD, CAD, s/p CABG x 3, s/p aortic valve replacement, and HTN.    PT Comments  Pt is POD # 1 and is progressing well.  He was able to ambulate 3' and performed stairs similar to home set up.  Pt with good pain control, ROM, and quad activation.  He has good understanding of HEP and has f/u with outpt PT scheduled.  Pt demonstrates safe gait & transfers in order to return home from PT perspective once discharged by MD.  While in hospital, will continue to benefit from PT for skilled therapy to advance mobility and exercises.       If plan is discharge home, recommend the following: A little help with walking and/or transfers;A little help with bathing/dressing/bathroom;Assistance with cooking/housework;Help with stairs or ramp for entrance;Assist for transportation   Can travel by private vehicle        Equipment Recommendations  Rolling walker (2 wheels)    Recommendations for Other Services       Precautions / Restrictions Precautions Precautions: Knee Restrictions Weight Bearing Restrictions: No LLE Weight Bearing: Weight bearing as tolerated     Mobility  Bed Mobility Overal bed mobility: Needs Assistance Bed Mobility: Supine to Sit     Supine to sit: Supervision          Transfers Overall transfer level: Needs assistance Equipment used: Rolling walker (2 wheels) Transfers: Sit to/from Stand Sit to Stand: Supervision           General transfer comment: min cues for UE placement; increased time/effort to rise from low bed but improved from chair    Ambulation/Gait Ambulation/Gait assistance: Supervision Gait Distance (Feet): 80  Feet Assistive device: Rolling walker (2 wheels) Gait Pattern/deviations: Step-through pattern Gait velocity: slightly decreased but functional     General Gait Details: Slight decrease in weight shift to L, good stability and RW management   Stairs Stairs: Yes Stairs assistance: Contact guard assist Stair Management: Two rails, One rail Right, Step to pattern, Backwards, Forwards, With cane Number of Stairs: 7 General stair comments: Started with bil rails and progressed to 1 rail R and cane L to simulate home environment.  Step to pattern with cues/education on sequencing and pt able to recall.  Pt did up forward and preferred down backwards.  Tolerated well with good stability   Wheelchair Mobility     Tilt Bed    Modified Rankin (Stroke Patients Only)       Balance Overall balance assessment: Needs assistance Sitting-balance support: Feet supported Sitting balance-Leahy Scale: Good     Standing balance support: Bilateral upper extremity supported, No upper extremity supported Standing balance-Leahy Scale: Fair Standing balance comment: RW to ambulate but could static stand without support                            Cognition Arousal: Alert Behavior During Therapy: WFL for tasks assessed/performed Overall Cognitive Status: Within Functional Limits for tasks assessed  Exercises Total Joint Exercises Ankle Circles/Pumps: AROM, 10 reps, Both, Supine Quad Sets: AROM, Left, 10 reps, Supine Heel Slides: AROM, Left, 10 reps, Supine Hip ABduction/ADduction: AROM, Left, 10 reps, Supine Long Arc Quad: AROM, Left, 10 reps, Seated Knee Flexion: AROM, Left, 10 reps, Seated Goniometric ROM: L knee 0 to 80 degrees Other Exercises Other Exercises: Cues for correct positioning  and AAROM techniques if needed    General Comments  Educated on safe ice use, no pivots, car transfers, resting with leg straight,  and TED hose during day. Also, encouraged walking every 1-2 hours during day. Educated on HEP with focus on mobility the first weeks. Discussed doing exercises within pain control and if pain increasing could decreased ROM, reps, and stop exercises as needed. Encouraged to perform quad sets and ankle pumps frequently for blood flow and to promote full knee extension.       Pertinent Vitals/Pain Pain Assessment Pain Assessment: 0-10 Pain Score: 3  Pain Location: L knee Pain Descriptors / Indicators: Discomfort, Sore Pain Intervention(s): Limited activity within patient's tolerance, Monitored during session, Premedicated before session, Ice applied    Home Living                          Prior Function            PT Goals (current goals can now be found in the care plan section) Progress towards PT goals: Progressing toward goals    Frequency    7X/week      PT Plan      Co-evaluation              AM-PAC PT "6 Clicks" Mobility   Outcome Measure  Help needed turning from your back to your side while in a flat bed without using bedrails?: None Help needed moving from lying on your back to sitting on the side of a flat bed without using bedrails?: A Little Help needed moving to and from a bed to a chair (including a wheelchair)?: A Little Help needed standing up from a chair using your arms (e.g., wheelchair or bedside chair)?: A Little Help needed to walk in hospital room?: A Little Help needed climbing 3-5 steps with a railing? : A Little 6 Click Score: 19    End of Session Equipment Utilized During Treatment: Gait belt Activity Tolerance: Patient tolerated treatment well Patient left: in chair;with call bell/phone within reach;with chair alarm set Nurse Communication: Mobility status PT Visit Diagnosis: Unsteadiness on feet (R26.81);Other abnormalities of gait and mobility (R26.89);Muscle weakness (generalized) (M62.81);Difficulty in walking, not  elsewhere classified (R26.2);Pain Pain - Right/Left: Left Pain - part of body: Knee;Leg     Time: 8295-6213 PT Time Calculation (min) (ACUTE ONLY): 36 min  Charges:    $Gait Training: 8-22 mins $Therapeutic Exercise: 8-22 mins PT General Charges $$ ACUTE PT VISIT: 1 Visit                     Anise Salvo, PT Acute Rehab Services  Rehab [phone]    Rayetta Humphrey 09/07/2022, 11:50 AM

## 2022-09-07 NOTE — Progress Notes (Addendum)
    Subjective: Patient reports pain as mild to moderate.  Denies N/V/CP/SOB/Abd pain. He denies any tingling or numbness in LE bilaterally. His pain is well controlled this morning.   Objective:   VITALS:   Vitals:   09/07/22 0513 09/07/22 0528 09/07/22 0755 09/07/22 1107  BP: (!) 92/50 (!) 92/50 (!) 95/56 (!) 91/58  Pulse:  74 73 74  Resp:  17 18 20   Temp:  98 F (36.7 C) 97.7 F (36.5 C) 97.9 F (36.6 C)  TempSrc:  Oral Oral Oral  SpO2:  98% 97% 96%  Weight:      Height:        Patient sitting up in bed. NAD.  Neurologically intact ABD soft Neurovascular intact Sensation intact distally Dorsiflexion/Plantar flexion intact Incision: dressing C/D/I No cellulitis present Compartment soft Distal pedal pulses 2+ bilaterally this  morning. Doppler not needed.   Lab Results  Component Value Date   WBC 8.9 09/07/2022   HGB 8.9 (L) 09/07/2022   HCT 27.8 (L) 09/07/2022   MCV 109.4 (H) 09/07/2022   PLT 141 (L) 09/07/2022   BMET    Component Value Date/Time   NA 135 09/07/2022 0342   NA 141 11/02/2020 1603   K 4.8 09/07/2022 0342   CL 102 09/07/2022 0342   CO2 25 09/07/2022 0342   GLUCOSE 126 (H) 09/07/2022 0342   GLUCOSE 92 10/25/2005 1507   BUN 21 09/07/2022 0342   BUN 23 11/02/2020 1603   CREATININE 1.17 09/07/2022 0342   CREATININE 1.05 08/29/2022 0839   CALCIUM 7.9 (L) 09/07/2022 0342   EGFR 61 11/02/2020 1603   GFRNONAA >60 09/07/2022 0342   GFRNONAA >60 08/29/2022 0839     Assessment/Plan: 1 Day Post-Op   Principal Problem:   Osteoarthritis of left knee Active Problems:   S/P total knee arthroplasty, left  ABLA. Hemoglobin 8.9. Asymptomatic. Continue to monitor.   WBAT with walker DVT ppx:  Eliquis , SCDs, TEDS PO pain control PT/OT: Patient amblulated 55 feet with PT yesterday. Continue PT today.  Dispo:  - D/c home with OPPT once cleared with PT and voided. Patient placed on prophylactic bactrim at discharge.    Clois Dupes,  PA-C 09/07/2022, 11:22 AM   [facility]  [place] [address], [place], [place] [address] Phone: [phone] [email or web address] Facebook  [employer]

## 2022-09-08 LAB — BASIC METABOLIC PANEL
Anion gap: 6 (ref 5–15)
BUN: 17 mg/dL (ref 8–23)
CO2: 29 mmol/L (ref 22–32)
Calcium: 8.2 mg/dL — ABNORMAL LOW (ref 8.9–10.3)
Chloride: 100 mmol/L (ref 98–111)
Creatinine, Ser: 0.82 mg/dL (ref 0.61–1.24)
GFR, Estimated: >60 mL/min (ref >60)
Glucose, Bld: 124 mg/dL — ABNORMAL HIGH (ref 70–99)
Potassium: 4.2 mmol/L (ref 3.5–5.1)
Sodium: 135 mmol/L (ref 135–145)

## 2022-09-08 LAB — CBC
HCT: 27.3 % — ABNORMAL LOW (ref 39.0–52.0)
Hemoglobin: 8.6 g/dL — ABNORMAL LOW (ref 13.0–17.0)
MCH: 34.8 pg — ABNORMAL HIGH (ref 26.0–34.0)
MCHC: 31.5 g/dL (ref 30.0–36.0)
MCV: 110.5 fL — ABNORMAL HIGH (ref 80.0–100.0)
Platelets: 133 10*3/uL — ABNORMAL LOW (ref 150–400)
RBC: 2.47 MIL/uL — ABNORMAL LOW (ref 4.22–5.81)
RDW: 13.2 % (ref 11.5–15.5)
WBC: 7.5 10*3/uL (ref 4.0–10.5)
nRBC: 0 % (ref 0.0–0.2)

## 2022-09-08 MED ORDER — HYDROMORPHONE HCL 2 MG PO TABS
4.0000 mg | ORAL_TABLET | ORAL | Status: DC | PRN
  Administered 2022-09-08 – 2022-09-09 (×4): 4 mg via ORAL
  Filled 2022-09-08 (×5): qty 2

## 2022-09-08 NOTE — Progress Notes (Signed)
The patient experienced emesis x1. Zofran administered to assist.

## 2022-09-08 NOTE — Progress Notes (Signed)
Subjective: 2 Days Post-Op Procedure(s) (LRB): TOTAL KNEE ARTHROPLASTY (Left)  Patient reports pain as progressed this am.  Denies fever, chills, N/V, CP, SOB.  Notes that his appetite has been decreased but ready to eat.  Admits to flatus.  Objective:   VITALS:  Temp:  [97.9 F (36.6 C)-99.4 F (37.4 C)] 99.1 F (37.3 C) (09/07 6295) Pulse Rate:  [73-78] 78 (09/07 0632) Resp:  [16-20] 16 (09/07 2841) BP: (91-131)/(58-80) 119/66 (09/07 3244) SpO2:  [93 %-96 %] 93 % (09/07 0102)  General: WDWN patient in NAD. Psych:  Appropriate mood and affect. Neuro:  A&O x 3, Moving all extremities, sensation intact to light touch HEENT:  EOMs intact Chest:  Even non-labored respirations Skin:  Dressing C/D/I, no rashes or lesions Extremities: warm/dry, mild edema L knee, no erythema or echymosis.  No lymphadenopathy. Pulses: Dorsalis pedis 2+ MSK:  ROM: lacks 5 degrees TKE, MMT: able to perform quad set, (-) Homan's    LABS Recent Labs    09/07/22 0342 09/08/22 0332  HGB 8.9* 8.6*  WBC 8.9 7.5  PLT 141* 133*   Recent Labs    09/07/22 0342 09/08/22 0332  NA 135 135  K 4.8 4.2  CL 102 100  CO2 25 29  BUN 21 17  CREATININE 1.17 0.82  GLUCOSE 126* 124*   No results for input(s): "LABPT", "INR" in the last 72 hours.   Assessment/Plan: 2 Days Post-Op Procedure(s) (LRB): TOTAL KNEE ARTHROPLASTY (Left)  Patient seen in rounds for Dr. Linde Gillis  Up with therapy DVP ppx: Eliquis D/C upon clearance from PT.  D/C order placed.  Please inform if order needs to be canceled. Plan for outpatient post-op visit with Dr. Linna Caprice.   Alfredo Martinez PA-C [facility] Office:  [phone]

## 2022-09-08 NOTE — Progress Notes (Signed)
Physical Therapy Treatment Patient Details Name: Gregory [name] MRN: [id number] DOB: 07-14-1950 Today's Date: 09/08/2022   History of Present Illness 72 yo male presents to therapy s/p L TKA on 09/06/2022 due to failure of conservative measures. Pt PMH includes but is not limited to: PAF, PVD, pacemaker, ICD in situ, anemia, gout, HLD, CAD, s/p CABG x 3, s/p aortic valve replacement, and HTN.    PT Comments  Pt continues very cooperative but significantly limited by ongoing pain control issues despite premed.  Pt assisted up from recliner but tolerated only very short distance ambulated before sitting at EOB and beginning to vomit - RN alerted, pt assisted back to bed.    If plan is discharge home, recommend the following: A little help with walking and/or transfers;A little help with bathing/dressing/bathroom;Assistance with cooking/housework;Help with stairs or ramp for entrance;Assist for transportation   Can travel by private vehicle        Equipment Recommendations  Rolling walker (2 wheels)    Recommendations for Other Services       Precautions / Restrictions Precautions Precautions: Knee Restrictions Weight Bearing Restrictions: Yes LLE Weight Bearing: Weight bearing as tolerated     Mobility  Bed Mobility Overal bed mobility: Needs Assistance Bed Mobility: Sit to Supine     Supine to sit: Supervision Sit to supine: Contact guard assist   General bed mobility comments: Increased time, extensive use of bed rails`    Transfers Overall transfer level: Needs assistance Equipment used: Rolling walker (2 wheels) Transfers: Sit to/from Stand Sit to Stand: Min assist           General transfer comment: cues for LE management and use of UEs to self assist.  Physical assist to bring wt up and fwd to balance in standing with RW    Ambulation/Gait Ambulation/Gait assistance: Min assist Gait Distance (Feet): 5 Feet Assistive device: Rolling walker (2 wheels) Gait  Pattern/deviations: Step-to pattern, Decreased step length - right, Decreased step length - left, Shuffle, Trunk flexed Gait velocity: decreased     General Gait Details: Increased time with cues for sequence, posture and position from RW.  Distance ltd by pain   Stairs             Wheelchair Mobility     Tilt Bed    Modified Rankin (Stroke Patients Only)       Balance Overall balance assessment: Needs assistance Sitting-balance support: Feet supported Sitting balance-Leahy Scale: Good     Standing balance support: Bilateral upper extremity supported, Reliant on assistive device for balance Standing balance-Leahy Scale: Poor Standing balance comment: RW to ambulate but could static stand without support                            Cognition Arousal: Alert Behavior During Therapy: WFL for tasks assessed/performed Overall Cognitive Status: Within Functional Limits for tasks assessed                                          Exercises Total Joint Exercises Ankle Circles/Pumps: AROM, 10 reps, Both, Supine Quad Sets: AROM, 10 reps, Supine, Both Heel Slides: AAROM, 15 reps, Supine, Left Straight Leg Raises: AAROM, Left, 10 reps, Supine    General Comments        Pertinent Vitals/Pain Pain Assessment Pain Assessment: 0-10 Pain Score: 8  Pain Location:  L knee Pain Descriptors / Indicators: Aching, Grimacing, Guarding, Sore Pain Intervention(s): Limited activity within patient's tolerance, Monitored during session, Premedicated before session, Ice applied    Home Living                          Prior Function            PT Goals (current goals can now be found in the care plan section) Acute Rehab PT Goals Patient Stated Goal: gst strong enough to have the R TKA, ride my bike and amb on uneven surfaces PT Goal Formulation: With patient Time For Goal Achievement: 09/20/22 Potential to Achieve Goals: Good Progress  towards PT goals: Not progressing toward goals - comment (pain limited)    Frequency    7X/week      PT Plan      Co-evaluation              AM-PAC PT "6 Clicks" Mobility   Outcome Measure  Help needed turning from your back to your side while in a flat bed without using bedrails?: None Help needed moving from lying on your back to sitting on the side of a flat bed without using bedrails?: A Little Help needed moving to and from a bed to a chair (including a wheelchair)?: A Little Help needed standing up from a chair using your arms (e.g., wheelchair or bedside chair)?: A Little Help needed to walk in hospital room?: Total Help needed climbing 3-5 steps with a railing? : Total 6 Click Score: 15    End of Session Equipment Utilized During Treatment: Gait belt Activity Tolerance: Patient limited by pain Patient left: in bed;with call bell/phone within reach;with family/visitor present Nurse Communication: Mobility status PT Visit Diagnosis: Unsteadiness on feet (R26.81);Other abnormalities of gait and mobility (R26.89);Muscle weakness (generalized) (M62.81);Difficulty in walking, not elsewhere classified (R26.2);Pain Pain - Right/Left: Left Pain - part of body: Knee;Leg     Time: 9147-8295 PT Time Calculation (min) (ACUTE ONLY): 16 min  Charges:    $Therapeutic Activity: 8-22 mins PT General Charges $$ ACUTE PT VISIT: 1 Visit                     Mauro Kaufmann PT Acute Rehabilitation Services Pager [phone] Office [phone]    Jeanell Mangan 09/08/2022, 4:24 PM

## 2022-09-08 NOTE — Plan of Care (Signed)

## 2022-09-08 NOTE — Discharge Summary (Cosign Needed Addendum)
Physician Discharge Summary  Patient ID: Gregory [name] MRN: [id number] DOB/AGE: 1950-01-27 72 y.o.  Admit date: 09/06/2022 Discharge date: 09/09/2022  Admission Diagnoses: L knee OA; hx of HTN, prediabetes, aortic insuff, anemia, anxiety, diastolic dysfunction, lymphedema, vit D def, aortic valve replacement, heparin-induced thrombocytopenia, A-fib, dermatitis, CABG x 3, CAD, cardiomyopathy, dyslipidemia, gout, AV block, heart block, placement of cardia pacemaker, PVD, cellulits of R LE, decreased renal function.  Discharge Diagnoses:  Principal Problem:   Osteoarthritis of left knee Active Problems:   S/P total knee arthroplasty, left Same as above  Discharged Condition: stable  Hospital Course: Patient presented to [facility] OR for elective L TKA by Dr. Linna Caprice.  He tolerated the procedure well without complication.  He was then admitted to the hospital.  He worked well with therapy.  He tolerated his stay well without complication. He is to be D/C'd home.  Consults:  PT  Significant Diagnostic Studies: N/A  Treatments: IV hydration, antibiotics: Ancef, analgesia: acetaminophen, Dilaudid, cardiac meds: metoprolol, anticoagulation: eliquois, and surgery: as stated above.  Discharge Exam: Blood pressure 119/66, pulse 78, temperature 99.1 F (37.3 C), temperature source Oral, resp. rate 16, height 5\' 6"  (1.676 m), weight 99.3 kg, SpO2 93%. General: WDWN patient in NAD. Psych:  Appropriate mood and affect. Neuro:  A&O x 3, Moving all extremities, sensation intact to light touch HEENT:  EOMs intact Chest:  Even non-labored respirations Skin:  Dressing C/D/I, no rashes or lesions Extremities: warm/dry, mild edema to L knee, no erythema or echymosis.  No lymphadenopathy. Pulses: Dorsalis pedis 2+ MSK:  ROM: lacks 5 degrees TKE, MMT: able to perform quad set, (-) Homan's   Disposition: Discharge disposition: 01-Home or Self Care       Discharge Instructions     Call MD / Call 911    Complete by: As directed    If you experience chest pain or shortness of breath, CALL 911 and be transported to the hospital emergency room.  If you develope a fever above 101 F, pus (white drainage) or increased drainage or redness at the wound, or calf pain, call your surgeon's office.   Call MD / Call 911   Complete by: As directed    If you experience chest pain or shortness of breath, CALL 911 and be transported to the hospital emergency room.  If you develope a fever above 101 F, pus (white drainage) or increased drainage or redness at the wound, or calf pain, call your surgeon's office.   Change dressing   Complete by: As directed    Do not remove your dressing.   Constipation Prevention   Complete by: As directed    Drink plenty of fluids.  Prune juice may be helpful.  You may use a stool softener, such as Colace (over the counter) 100 mg twice a day.  Use MiraLax (over the counter) for constipation as needed.   Constipation Prevention   Complete by: As directed    Drink plenty of fluids.  Prune juice may be helpful.  You may use a stool softener, such as Colace (over the counter) 100 mg twice a day.  Use MiraLax (over the counter) for constipation as needed.   Diet - low sodium heart healthy   Complete by: As directed    Diet - low sodium heart healthy   Complete by: As directed    Discharge instructions   Complete by: As directed    Elevate toes above nose. Use cryotherapy as needed for pain and  swelling.   Do not put a pillow under the knee. Place it under the heel.   Complete by: As directed    Driving restrictions   Complete by: As directed    No driving for 6 weeks   Increase activity slowly as tolerated   Complete by: As directed    Increase activity slowly as tolerated   Complete by: As directed    Lifting restrictions   Complete by: As directed    No lifting for 6 weeks   Post-operative opioid taper instructions:   Complete by: As directed    POST-OPERATIVE  OPIOID TAPER INSTRUCTIONS: It is important to wean off of your opioid medication as soon as possible. If you do not need pain medication after your surgery it is ok to stop day one. Opioids include: Codeine, Hydrocodone(Norco, Vicodin), Oxycodone(Percocet, oxycontin) and hydromorphone amongst others.  Long term and even short term use of opiods can cause: Increased pain response Dependence Constipation Depression Respiratory depression And more.  Withdrawal symptoms can include Flu like symptoms Nausea, vomiting And more Techniques to manage these symptoms Hydrate well Eat regular healthy meals Stay active Use relaxation techniques(deep breathing, meditating, yoga) Do Not substitute Alcohol to help with tapering If you have been on opioids for less than two weeks and do not have pain than it is ok to stop all together.  Plan to wean off of opioids This plan should start within one week post op of your joint replacement. Maintain the same interval or time between taking each dose and first decrease the dose.  Cut the total daily intake of opioids by one tablet each day Next start to increase the time between doses. The last dose that should be eliminated is the evening dose.      Post-operative opioid taper instructions:   Complete by: As directed    POST-OPERATIVE OPIOID TAPER INSTRUCTIONS: It is important to wean off of your opioid medication as soon as possible. If you do not need pain medication after your surgery it is ok to stop day one. Opioids include: Codeine, Hydrocodone(Norco, Vicodin), Oxycodone(Percocet, oxycontin) and hydromorphone amongst others.  Long term and even short term use of opiods can cause: Increased pain response Dependence Constipation Depression Respiratory depression And more.  Withdrawal symptoms can include Flu like symptoms Nausea, vomiting And more Techniques to manage these symptoms Hydrate well Eat regular healthy meals Stay  active Use relaxation techniques(deep breathing, meditating, yoga) Do Not substitute Alcohol to help with tapering If you have been on opioids for less than two weeks and do not have pain than it is ok to stop all together.  Plan to wean off of opioids This plan should start within one week post op of your joint replacement. Maintain the same interval or time between taking each dose and first decrease the dose.  Cut the total daily intake of opioids by one tablet each day Next start to increase the time between doses. The last dose that should be eliminated is the evening dose.      TED hose   Complete by: As directed    Use stockings (TED hose) for 2 weeks on both leg(s).  You may remove them at night for sleeping.   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   allopurinol 100 MG tablet Commonly known as: ZYLOPRIM Take 1 tablet by mouth once daily   ALPRAZolam 0.5 MG tablet Commonly known as: XANAX TAKE 1 TABLET BY MOUTH ONCE DAILY AS  NEEDED  FOR  ANXIETY  TAKE  30  MINUTES  PRIOR  TO  PLANE FLIGHT OR AS NEEDED FOR ANXIETY   apixaban 5 MG Tabs tablet Commonly known as: Eliquis Take 1 tablet (5 mg total) by mouth 2 (two) times daily.   atorvastatin 80 MG tablet Commonly known as: LIPITOR Take 1 tablet by mouth once daily   colchicine 0.6 MG tablet Take 2 tablets by mouth once daily What changed: how much to take   docusate sodium 100 MG capsule Commonly known as: Colace Take 1 capsule  (100 mg total) by mouth 2 (two) times daily.   Entresto 97-103 MG Generic drug: sacubitril-valsartan Take 1 tablet by mouth twice daily   furosemide 20 MG tablet Commonly known as: LASIX TAKE TWO TABLETS BY MOUTH IN THE MORNING AND TAKE ONE TABLET BY MOUTH IN THE AFTERNOON   Jatenzo 237 MG Caps Generic drug: Testosterone Undecanoate Take 237 mg by mouth 2 (two) times daily.   ketoconazole 2 % shampoo Commonly known as: NIZORAL Apply 1 application topically 2 (two) times a week. What changed:  when to take this reasons to take this   Klor-Con M20 20 MEQ tablet Generic drug: potassium chloride SA Take 1 tablet by mouth once daily   latanoprost 0.005 % ophthalmic solution Commonly known as: XALATAN Place 1 drop into both eyes every morning.   metoprolol succinate 25 MG 24 hr tablet Commonly known as: TOPROL-XL Take 1 tablet (25 mg total) by mouth 2 (two) times daily.   ondansetron 4 MG tablet Commonly known as: Zofran Take 1 tablet (4 mg total) by mouth every 8 (eight) hours as needed for nausea or vomiting.   Osteo Bi-Flex/5-Loxin Advanced Tabs Take 2 tablets by mouth daily.   oxyCODONE 5 MG immediate release tablet Commonly known as: Roxicodone Take 1 tablet (5 mg total) by mouth every 4 (four) hours as needed for severe pain.   polyethylene glycol 17 g packet Commonly known as: MiraLax Take 17 g by mouth daily as needed for mild constipation or moderate constipation.   senna 8.6 MG Tabs tablet Commonly known as: SENOKOT Take 2 tablets (17.2 mg total) by mouth at bedtime for 15 days.   sertraline 50 MG tablet Commonly known as: ZOLOFT Take 1 tablet by mouth once daily   spironolactone 25 MG tablet Commonly known as: ALDACTONE Take 1 tablet by mouth once daily   sulfamethoxazole-trimethoprim 800-160 MG tablet Commonly known as: BACTRIM DS Take 1 tablet by mouth 2 (two) times daily for 10 days.   Testosterone 1.62 % Gel Apply 2 Pump topically daily.    traZODone 50 MG tablet Commonly known as: DESYREL TAKE 1/2 TO 1 (ONE-HALF TO ONE) TABLET BY MOUTH AT BEDTIME AS NEEDED FOR SLEEP               Discharge Care Instructions  (  From admission, onward)           Start     Ordered   09/08/22 0000  Weight bearing as tolerated       Question Answer Comment  Laterality left   Extremity Lower      09/08/22 0911   09/07/22 0000  Weight bearing as tolerated        09/07/22 1130   09/07/22 0000  Change dressing       Comments: Do not remove your dressing.   09/07/22 1130            Follow-up Information     Clois Dupes, PA-C. Go on 09/21/2022.   Specialty: Orthopedic Surgery Why: You are scheduled for a follow up appointment on 09-21-22 at 9:45 am. Contact information: [address] [place] [address] [phone]                 Signed: Lolly Mustache Office:  [phone]

## 2022-09-08 NOTE — Progress Notes (Signed)
Physical Therapy Treatment Patient Details Name: Gregory [name] MRN: [id number] DOB: 12/21/1950 Today's Date: 09/08/2022   History of Present Illness 72 yo male presents to therapy s/p L TKA on 09/06/2022 due to failure of conservative measures. Pt PMH includes but is not limited to: PAF, PVD, pacemaker, ICD in situ, anemia, gout, HLD, CAD, s/p CABG x 3, s/p aortic valve replacement, and HTN.    PT Comments  Pt very cooperative but limited this date by marked increased in pain.  Pt performed limited therex program and up to ambulate short distance in room but returned to sitting with elevating pain level - RN aware.    If plan is discharge home, recommend the following: A little help with walking and/or transfers;A little help with bathing/dressing/bathroom;Assistance with cooking/housework;Help with stairs or ramp for entrance;Assist for transportation   Can travel by private vehicle        Equipment Recommendations  Rolling walker (2 wheels)    Recommendations for Other Services       Precautions / Restrictions Precautions Precautions: Knee Restrictions Weight Bearing Restrictions: No LLE Weight Bearing: Weight bearing as tolerated     Mobility  Bed Mobility Overal bed mobility: Needs Assistance Bed Mobility: Supine to Sit     Supine to sit: Supervision     General bed mobility comments: Increased time, extensive use of bed rails`    Transfers Overall transfer level: Needs assistance Equipment used: Rolling walker (2 wheels) Transfers: Sit to/from Stand Sit to Stand: Min assist           General transfer comment: cues for LE management and use of UEs to self assist.  Physical assist to bring wt up and fwd to balance in standing with RW    Ambulation/Gait Ambulation/Gait assistance: Min assist Gait Distance (Feet): 8 Feet Assistive device: Rolling walker (2 wheels) Gait Pattern/deviations: Step-to pattern, Decreased step length - right, Decreased step length  - left, Shuffle, Trunk flexed Gait velocity: decreased     General Gait Details: Increased time with cues for sequence, posture and position from RW.  Distance ltd by pain   Stairs             Wheelchair Mobility     Tilt Bed    Modified Rankin (Stroke Patients Only)       Balance Overall balance assessment: Needs assistance Sitting-balance support: Feet supported Sitting balance-Leahy Scale: Good     Standing balance support: Bilateral upper extremity supported, Reliant on assistive device for balance Standing balance-Leahy Scale: Poor                              Cognition Arousal: Alert Behavior During Therapy: WFL for tasks assessed/performed Overall Cognitive Status: Within Functional Limits for tasks assessed                                          Exercises Total Joint Exercises Ankle Circles/Pumps: AROM, 10 reps, Both, Supine Quad Sets: AROM, 10 reps, Supine, Both Heel Slides: AAROM, 15 reps, Supine, Left Straight Leg Raises: AAROM, Left, 10 reps, Supine    General Comments        Pertinent Vitals/Pain Pain Assessment Pain Assessment: 0-10 Pain Score: 8  Pain Location: L knee Pain Descriptors / Indicators: Aching, Grimacing, Guarding, Sore Pain Intervention(s): Limited activity within patient's tolerance, Monitored during session,  Premedicated before session, Ice applied    Home Living                          Prior Function            PT Goals (current goals can now be found in the care plan section) Acute Rehab PT Goals Patient Stated Goal: gst strong enough to have the R TKA, ride my bike and amb on uneven surfaces PT Goal Formulation: With patient Time For Goal Achievement: 09/20/22 Potential to Achieve Goals: Good Progress towards PT goals: Progressing toward goals    Frequency    7X/week      PT Plan      Co-evaluation              AM-PAC PT "6 Clicks" Mobility    Outcome Measure  Help needed turning from your back to your side while in a flat bed without using bedrails?: None Help needed moving from lying on your back to sitting on the side of a flat bed without using bedrails?: A Little Help needed moving to and from a bed to a chair (including a wheelchair)?: A Little Help needed standing up from a chair using your arms (e.g., wheelchair or bedside chair)?: A Little Help needed to walk in hospital room?: Total Help needed climbing 3-5 steps with a railing? : Total 6 Click Score: 15    End of Session Equipment Utilized During Treatment: Gait belt Activity Tolerance: Patient limited by pain Patient left: in chair;with call bell/phone within reach;with chair alarm set;with family/visitor present Nurse Communication: Mobility status PT Visit Diagnosis: Unsteadiness on feet (R26.81);Other abnormalities of gait and mobility (R26.89);Muscle weakness (generalized) (M62.81);Difficulty in walking, not elsewhere classified (R26.2);Pain Pain - Right/Left: Left Pain - part of body: Knee;Leg     Time: 1207-1239 PT Time Calculation (min) (ACUTE ONLY): 32 min  Charges:    $Gait Training: 8-22 mins $Therapeutic Exercise: 8-22 mins PT General Charges $$ ACUTE PT VISIT: 1 Visit                     Mauro Kaufmann PT Acute Rehabilitation Services Pager [phone] Office [phone]    Gregory [name] 09/08/2022, 12:55 PM

## 2022-09-09 DIAGNOSIS — Z95810 Presence of automatic (implantable) cardiac defibrillator: Secondary | ICD-10-CM | POA: Diagnosis not present

## 2022-09-09 DIAGNOSIS — I252 Old myocardial infarction: Secondary | ICD-10-CM | POA: Diagnosis not present

## 2022-09-09 DIAGNOSIS — Z811 Family history of alcohol abuse and dependence: Secondary | ICD-10-CM | POA: Diagnosis not present

## 2022-09-09 DIAGNOSIS — N289 Disorder of kidney and ureter, unspecified: Secondary | ICD-10-CM | POA: Diagnosis not present

## 2022-09-09 DIAGNOSIS — D6489 Other specified anemias: Secondary | ICD-10-CM | POA: Diagnosis not present

## 2022-09-09 DIAGNOSIS — I739 Peripheral vascular disease, unspecified: Secondary | ICD-10-CM | POA: Diagnosis not present

## 2022-09-09 DIAGNOSIS — F411 Generalized anxiety disorder: Secondary | ICD-10-CM | POA: Diagnosis not present

## 2022-09-09 DIAGNOSIS — Z96652 Presence of left artificial knee joint: Secondary | ICD-10-CM | POA: Diagnosis not present

## 2022-09-09 DIAGNOSIS — Z953 Presence of xenogenic heart valve: Secondary | ICD-10-CM | POA: Diagnosis not present

## 2022-09-09 DIAGNOSIS — I2510 Atherosclerotic heart disease of native coronary artery without angina pectoris: Secondary | ICD-10-CM | POA: Diagnosis not present

## 2022-09-09 DIAGNOSIS — I110 Hypertensive heart disease with heart failure: Secondary | ICD-10-CM | POA: Diagnosis not present

## 2022-09-09 DIAGNOSIS — M1A9XX0 Chronic gout, unspecified, without tophus (tophi): Secondary | ICD-10-CM | POA: Diagnosis not present

## 2022-09-09 DIAGNOSIS — Z803 Family history of malignant neoplasm of breast: Secondary | ICD-10-CM | POA: Diagnosis not present

## 2022-09-09 DIAGNOSIS — Z79899 Other long term (current) drug therapy: Secondary | ICD-10-CM | POA: Diagnosis not present

## 2022-09-09 DIAGNOSIS — Z7901 Long term (current) use of anticoagulants: Secondary | ICD-10-CM | POA: Diagnosis not present

## 2022-09-09 DIAGNOSIS — M1712 Unilateral primary osteoarthritis, left knee: Secondary | ICD-10-CM | POA: Diagnosis not present

## 2022-09-09 DIAGNOSIS — Z951 Presence of aortocoronary bypass graft: Secondary | ICD-10-CM | POA: Diagnosis not present

## 2022-09-09 DIAGNOSIS — I480 Paroxysmal atrial fibrillation: Secondary | ICD-10-CM | POA: Diagnosis not present

## 2022-09-09 DIAGNOSIS — Z888 Allergy status to other drugs, medicaments and biological substances status: Secondary | ICD-10-CM | POA: Diagnosis not present

## 2022-09-09 DIAGNOSIS — I5032 Chronic diastolic (congestive) heart failure: Secondary | ICD-10-CM | POA: Diagnosis not present

## 2022-09-09 MED ORDER — KETOROLAC TROMETHAMINE 15 MG/ML IJ SOLN
7.5000 mg | Freq: Four times a day (QID) | INTRAMUSCULAR | Status: DC
  Administered 2022-09-09: 7.5 mg via INTRAVENOUS
  Filled 2022-09-09: qty 1

## 2022-09-09 NOTE — Progress Notes (Signed)
Physical Therapy Treatment Patient Details Name: Gregory [name] MRN: [id number] DOB: 03-19-1950 Today's Date: 09/09/2022   History of Present Illness 72 yo male presents to therapy s/p L TKA on 09/06/2022 due to failure of conservative measures. 72 yo male presents to therapy s/p L TKA on 09/06/2022 due to failure of conservative measures. Pt PMH includes but is not limited to: PAF, PVD, pacemaker, ICD in situ, anemia, gout, HLD, CAD, s/p CABG x 3, s/p aortic valve replacement, and HTN.    PT Comments  Pt in good spirits with noted improvement in pain control vs last session.  Pt performed therex program with assist and up to ambulate increased (but still limited) distance in hall.  Pt states feels comfortable with dc home this date.  Will return to follow up with stair training.    If plan is discharge home, recommend the following: A little help with walking and/or transfers;A little help with bathing/dressing/bathroom;Assistance with cooking/housework;Help with stairs or ramp for entrance;Assist for transportation   Can travel by private vehicle        Equipment Recommendations  Rolling walker (2 wheels)    Recommendations for Other Services       Precautions / Restrictions Precautions Precautions: Knee Restrictions Weight Bearing Restrictions: No LLE Weight Bearing: Weight bearing as tolerated     Mobility  Bed Mobility Overal bed mobility: Needs Assistance Bed Mobility: Supine to Sit     Supine to sit: Supervision     General bed mobility comments: Increased time with limited use of bed rails but no physical assist    Transfers Overall transfer level: Needs assistance Equipment used: Rolling walker (2 wheels) Transfers: Sit to/from Stand Sit to Stand: Contact guard assist           General transfer comment: Steady assist with cues for LE management and use of UEs to self assist.    Ambulation/Gait Ambulation/Gait assistance: Contact guard assist Gait Distance (Feet): 48 Feet Assistive device: Rolling walker (2 wheels) Gait Pattern/deviations:  Step-to pattern, Decreased step length - right, Decreased step length - left, Shuffle, Trunk flexed Gait velocity: decreased     General Gait Details: Increased time with cues for sequence, posture and position from RW.  Distance ltd by Psychiatric nurse     Tilt Bed    Modified Rankin (Stroke Patients Only)       Balance Overall balance assessment: Needs assistance Sitting-balance support: Feet supported Sitting balance-Leahy Scale: Good     Standing balance support: Single extremity supported Standing balance-Leahy Scale: Poor                              Cognition Arousal: Alert Behavior During Therapy: WFL for tasks assessed/performed Overall Cognitive Status: Within Functional Limits for tasks assessed                                          Exercises Total Joint Exercises Ankle Circles/Pumps: AROM, 10 reps, Both, Supine Quad Sets: AROM, 10 reps, Supine, Both Heel Slides: AAROM, Supine, Left, 20 reps Hip ABduction/ADduction: AROM, Left, Supine, 15 reps Straight Leg Raises: AAROM, Left, Supine, 20 reps    General Comments        Pertinent Vitals/Pain Pain Assessment Pain Assessment: 0-10 Pain Score: 6  Pain Location: L knee Pain Descriptors / Indicators: Aching, Grimacing, Guarding, Sore Pain Intervention(s):  Limited activity within patient's tolerance, Monitored during session, Premedicated before session, Ice applied    Home Living                          Prior Function            PT Goals (current goals can now be found in the care plan section) Acute Rehab PT Goals Patient Stated Goal: gst strong enough to have the R TKA, ride my bike and amb on uneven surfaces PT Goal Formulation: With patient Time For Goal Achievement: 09/20/22 Potential to Achieve Goals: Good Progress towards PT goals: Progressing toward goals    Frequency    7X/week      PT  Plan      Co-evaluation              AM-PAC PT "6 Clicks" Mobility   Outcome Measure  Help needed turning from your back to your side while in a flat bed without using bedrails?: None Help needed moving from lying on your back to sitting on the side of a flat bed without using bedrails?: A Little Help needed moving to and from a bed to a chair (including a wheelchair)?: A Little Help needed standing up from a chair using your arms (e.g., wheelchair or bedside chair)?: A Little Help needed to walk in hospital room?: A Little Help needed climbing 3-5 steps with a railing? : Total 6 Click Score: 17    End of Session Equipment Utilized During Treatment: Gait belt Activity Tolerance: Patient tolerated treatment well;Patient limited by fatigue;Patient limited by pain Patient left: in chair;with call bell/phone within reach;with chair alarm set Nurse Communication: Mobility status PT Visit Diagnosis: Unsteadiness on feet (R26.81);Other abnormalities of gait and mobility (R26.89);Muscle weakness (generalized) (M62.81);Difficulty in walking, not elsewhere classified (R26.2);Pain Pain - Right/Left: Left Pain - part of body: Knee;Leg     Time: 5284-1324 PT Time Calculation (min) (ACUTE ONLY): 33 min  Charges:    $Gait Training: 8-22 mins $Therapeutic Exercise: 8-22 mins PT General Charges $$ ACUTE PT VISIT: 1 Visit                     Mauro Kaufmann PT Acute Rehabilitation Services Pager [phone] Office [phone]    Jonel Sick 09/09/2022, 12:56 PM

## 2022-09-09 NOTE — Progress Notes (Signed)
Patient ID: Gregory [name], male   DOB: Jan 28, 1950, 72 y.o.   MRN: [id number] Subjective: 3 Days Post-Op Procedure(s) (LRB): TOTAL KNEE ARTHROPLASTY (Left)    Patient reports pain as moderate.  Progress has been slow due to post operative pain. Otherwise no events  Objective:   VITALS:   Vitals:   09/08/22 2230 09/09/22 0619  BP: 106/61 122/64  Pulse: 89 83  Resp: 18 20  Temp: 99.2 F (37.3 C) 99.1 F (37.3 C)  SpO2: 92% 92%    Neurovascular intact Incision: dressing C/D/I - ACE wrap removed, dressing dry  LABS Recent Labs    09/07/22 0342 09/08/22 0332  HGB 8.9* 8.6*  HCT 27.8* 27.3*  WBC 8.9 7.5  PLT 141* 133*    Recent Labs    09/07/22 0342 09/08/22 0332  NA 135 135  K 4.8 4.2  BUN 21 17  CREATININE 1.17 0.82  GLUCOSE 126* 124*    No results for input(s): "LABPT", "INR" in the last 72 hours.   Assessment/Plan: 3 Days Post-Op Procedure(s) (LRB): TOTAL KNEE ARTHROPLASTY (Left)   Up with therapy Ok to discharge to home if progresses with therapy RTC in 2 weeks to see [facility]

## 2022-09-09 NOTE — Progress Notes (Signed)
Physical Therapy Treatment Patient Details Name: Gregory [name] MRN: [id number] DOB: Sep 19, 1950 Today's Date: 09/09/2022   History of Present Illness 72 yo male presents to therapy s/p L TKA on 09/06/2022 due to failure of conservative measures. Pt PMH includes but is not limited to: PAF, PVD, pacemaker, ICD in situ, anemia, gout, HLD, CAD, s/p CABG x 3, s/p aortic valve replacement, and HTN.    PT Comments  Pt continues in good spirits with improved pain control.  Pt up to ambulate limited distance in hall to negotiate stairs with rail and cane.  Pt eager to dc home this date.    If plan is discharge home, recommend the following: A little help with walking and/or transfers;A little help with bathing/dressing/bathroom;Assistance with cooking/housework;Help with stairs or ramp for entrance;Assist for transportation   Can travel by private vehicle        Equipment Recommendations  Rolling walker (2 wheels)    Recommendations for Other Services       Precautions / Restrictions Precautions Precautions: Knee Restrictions Weight Bearing Restrictions: No LLE Weight Bearing: Weight bearing as tolerated     Mobility  Bed Mobility Overal bed mobility: Needs Assistance Bed Mobility: Supine to Sit     Supine to sit: Supervision     General bed mobility comments: Pt up in chair and returns to same    Transfers Overall transfer level: Needs assistance Equipment used: Rolling walker (2 wheels) Transfers: Sit to/from Stand Sit to Stand: Supervision           General transfer comment: cues for LE management and use of UEs to self assist.    Ambulation/Gait Ambulation/Gait assistance: Contact guard assist, Supervision Gait Distance (Feet): 45 Feet Assistive device: Rolling walker (2 wheels) Gait Pattern/deviations: Step-to pattern, Decreased step length - right, Decreased step length - left, Shuffle, Trunk flexed, Antalgic Gait velocity: decreased     General Gait  Details: Increased time with cues for sequence, posture and position from RW.  Distance ltd by pain/fatigue   Stairs Stairs: Yes Stairs assistance: Min assist Stair Management: One rail Right, Step to pattern, Forwards, Backwards, With cane Number of Stairs: 2 General stair comments: Up fwd and dwn bkwd "its how I have gotten used to doing it".  CUes for sequence. Steady assist.   Wheelchair Mobility     Tilt Bed    Modified Rankin (Stroke Patients Only)       Balance Overall balance assessment: Needs assistance Sitting-balance support: Feet supported Sitting balance-Leahy Scale: Good     Standing balance support: Single extremity supported Standing balance-Leahy Scale: Poor                              Cognition Arousal: Alert Behavior During Therapy: WFL for tasks assessed/performed Overall Cognitive Status: Within Functional Limits for tasks assessed                                          Exercises Total Joint Exercises Ankle Circles/Pumps: AROM, 10 reps, Both, Supine Quad Sets: AROM, 10 reps, Supine, Both Heel Slides: AAROM, Supine, Left, 20 reps Hip ABduction/ADduction: AROM, Left, Supine, 15 reps Straight Leg Raises: AAROM, Left, Supine, 20 reps    General Comments        Pertinent Vitals/Pain Pain Assessment Pain Assessment: 0-10 Pain Score: 6  Pain Location: L  knee Pain Descriptors / Indicators: Aching, Grimacing, Guarding, Sore Pain Intervention(s): Limited activity within patient's tolerance, Monitored during session, Premedicated before session, Ice applied    Home Living                          Prior Function            PT Goals (current goals can now be found in the care plan section) Acute Rehab PT Goals Patient Stated Goal: gst strong enough to have the R TKA, ride my bike and amb on uneven surfaces PT Goal Formulation: With patient Time For Goal Achievement: 09/20/22 Potential to Achieve  Goals: Good Progress towards PT goals: Progressing toward goals    Frequency    7X/week      PT Plan      Co-evaluation              AM-PAC PT "6 Clicks" Mobility   Outcome Measure  Help needed turning from your back to your side while in a flat bed without using bedrails?: None Help needed moving from lying on your back to sitting on the side of a flat bed without using bedrails?: A Little Help needed moving to and from a bed to a chair (including a wheelchair)?: A Little Help needed standing up from a chair using your arms (e.g., wheelchair or bedside chair)?: A Little Help needed to walk in hospital room?: A Little Help needed climbing 3-5 steps with a railing? : A Little 6 Click Score: 19    End of Session Equipment Utilized During Treatment: Gait belt Activity Tolerance: Patient tolerated treatment well Patient left: in chair;with call bell/phone within reach;with family/visitor present Nurse Communication: Mobility status PT Visit Diagnosis: Unsteadiness on feet (R26.81);Other abnormalities of gait and mobility (R26.89);Muscle weakness (generalized) (M62.81);Difficulty in walking, not elsewhere classified (R26.2);Pain Pain - Right/Left: Left Pain - part of body: Knee;Leg     Time: 4098-1191 PT Time Calculation (min) (ACUTE ONLY): 16 min  Charges:    $Gait Training: 8-22 mins $Therapeutic Exercise: 8-22 mins PT General Charges $$ ACUTE PT VISIT: 1 Visit                     Mauro Kaufmann PT Acute Rehabilitation Services Pager [phone] Office [phone]    Siobhan Zaro 09/09/2022, 1:02 PM

## 2022-09-09 NOTE — Plan of Care (Signed)
Discharge instructions given to the patient including medications and follow up.  

## 2022-09-10 ENCOUNTER — Telehealth: Payer: Self-pay

## 2022-09-10 ENCOUNTER — Other Ambulatory Visit: Payer: Self-pay | Admitting: Cardiology

## 2022-09-10 DIAGNOSIS — Z952 Presence of prosthetic heart valve: Secondary | ICD-10-CM

## 2022-09-10 DIAGNOSIS — E785 Hyperlipidemia, unspecified: Secondary | ICD-10-CM

## 2022-09-10 DIAGNOSIS — I2510 Atherosclerotic heart disease of native coronary artery without angina pectoris: Secondary | ICD-10-CM

## 2022-09-10 DIAGNOSIS — I10 Essential (primary) hypertension: Secondary | ICD-10-CM

## 2022-09-10 DIAGNOSIS — I5022 Chronic systolic (congestive) heart failure: Secondary | ICD-10-CM

## 2022-09-10 NOTE — Transitions of Care (Post Inpatient/ED Visit) (Signed)
09/10/2022  Name: Gregory [name] MRN: [id number] DOB: 1950/10/02  Today's TOC FU Call Status: Today's TOC FU Call Status:: Successful TOC FU Call Completed TOC FU Call Complete Date: 09/09/22 Patient's Name and Date of Birth confirmed.  Transition Care Management Follow-up Telephone Call Date of Discharge: 09/09/22 Discharge Facility: [facility]) Type of Discharge: Inpatient Admission Primary Inpatient Discharge Diagnosis:: left knee replacement How have you been since you were released from the hospital?: Same Any questions or concerns?: Yes Patient Questions/Concerns:: reports walking slowly.  Using walker.  Pain level 5.   Prefers to sit in chair. Patient Questions/Concerns Addressed: Provided Patient Educational Materials (Reviewed importance of walking.)  Items Reviewed: Did you receive and understand the discharge instructions provided?: Yes Medications obtained,verified, and reconciled?: Yes (Medications Reviewed) Any new allergies since your discharge?: No Dietary orders reviewed?: Yes Do you have support at home?: Yes People in Home: spouse Name of Support/Comfort Primary Source: Wife Arline Asp  Medications Reviewed Today: Medications Reviewed Today     Reviewed by Earlie Server, RN (Registered Nurse) on 09/10/22 at 1303  Med List Status: <None>   Medication Order Taking? Sig Documenting Provider Last Dose Status Informant  acetaminophen (TYLENOL) 500 MG tablet [id number] Yes Take 1,000 mg by mouth every 6 (six) hours as needed for moderate pain. [provider] Taking Active Spouse/Significant Other  allopurinol (ZYLOPRIM) 100 MG tablet [id number] Yes Take 1 tablet by mouth once daily Bedsole, Amy E, MD Taking Active Spouse/Significant Other  ALPRAZolam (XANAX) 0.5 MG tablet [id number] No TAKE 1 TABLET BY MOUTH ONCE DAILY AS  NEEDED  FOR  ANXIETY  TAKE  30  MINUTES  PRIOR  TO  PLANE FLIGHT OR AS NEEDED FOR ANXIETY  Patient not taking: Reported on  09/10/2022   Excell Seltzer, MD Not Taking Active Spouse/Significant Other  apixaban (ELIQUIS) 5 MG TABS tablet [id number] Yes Take 1 tablet (5 mg total) by mouth 2 (two) times daily. Eustace Pen, PA-C Taking Active Spouse/Significant Other  atorvastatin (LIPITOR) 80 MG tablet [id number] Yes Take 1 tablet by mouth once daily Bedsole, Amy E, MD Taking Active Spouse/Significant Other  colchicine 0.6 MG tablet [id number] Yes Take 2 tablets by mouth once daily  Patient taking differently: Take 0.6 mg by mouth daily.   Excell Seltzer, MD Taking Active Spouse/Significant Other  docusate sodium (COLACE) 100 MG capsule [id number] Yes Take 1 capsule (100 mg total) by mouth 2 (two) times daily. [address], [place] Taking Active   furosemide (LASIX) 20 MG tablet [id number] Yes TAKE TWO TABLETS BY MOUTH IN THE MORNING AND TAKE ONE TABLET BY MOUTH IN THE Freddi Che, MD Taking Active Spouse/Significant Other  JATENZO 237 MG CAPS [id number] No Take 237 mg by mouth 2 (two) times daily.  Patient not taking: Reported on 09/10/2022   [provider] Not Taking Active Spouse/Significant Other  ketoconazole (NIZORAL) 2 % shampoo [id number] Yes Apply 1 application topically 2 (two) times a week.  Patient taking differently: Apply 1 application  topically daily as needed for irritation.   Excell Seltzer, MD Taking Active Spouse/Significant Other  latanoprost (XALATAN) 0.005 % ophthalmic solution [id number] Yes Place 1 drop into both eyes every morning.  [provider] Taking Active Spouse/Significant Other  metoprolol succinate (TOPROL-XL) 25 MG 24 hr tablet [id number] Yes Take 1 tablet (25 mg total) by mouth 2 (two) times daily. Rollene Rotunda, MD Taking Active Spouse/Significant Other  Misc Natural Products [facility] BI-FLEX/5-LOXIN ADVANCED) TABS [id number] Yes  Take 2 tablets by mouth daily. [provider] Taking Active Spouse/Significant Other  ondansetron (ZOFRAN) 4 MG tablet  [id number] Yes Take 1 tablet (4 mg total) by mouth every 8 (eight) hours as needed for nausea or vomiting. Clois Dupes, [place] Taking Active   oxyCODONE (ROXICODONE) 5 MG immediate release tablet [id number] Yes Take 1 tablet (5 mg total) by mouth every 4 (four) hours as needed for severe pain. Clint Bolder S, [place] Taking Active   polyethylene glycol (MIRALAX) 17 g packet [id number] Yes Take 17 g by mouth daily as needed for mild constipation or moderate constipation. Clint Bolder S, [place] Taking Active   potassium chloride SA (KLOR-CON M20) 20 MEQ tablet [id number] Yes Take 1 tablet by mouth once daily Rollene Rotunda, MD Taking Active Spouse/Significant Other  sacubitril-valsartan (ENTRESTO) 97-103 MG [id number] Yes Take 1 tablet by mouth twice daily Rollene Rotunda, MD Taking Active Spouse/Significant Other  senna (SENOKOT) 8.6 MG TABS tablet [id number] Yes Take 2 tablets (17.2 mg total) by mouth at bedtime for 15 days. [address], [place] Taking Active   sertraline (ZOLOFT) 50 MG tablet [id number] Yes Take 1 tablet by mouth once daily Bedsole, Amy E, MD Taking Active Spouse/Significant Other  spironolactone (ALDACTONE) 25 MG tablet [id number] Yes Take 1 tablet by mouth once daily Rollene Rotunda, MD Taking Active Spouse/Significant Other  sulfamethoxazole-trimethoprim (BACTRIM DS) 800-160 MG tablet [id number] Yes Take 1 tablet by mouth 2 (two) times daily for 10 days. Clois Dupes, [place] Taking Active   Testosterone 1.62 % GEL [id number] Yes Apply 2 Pump topically daily. [provider] Taking Active Spouse/Significant Other  traZODone (DESYREL) 50 MG tablet [id number] Yes TAKE 1/2 TO 1 (ONE-HALF TO ONE) TABLET BY MOUTH AT BEDTIME AS NEEDED FOR SLEEP Bedsole, Amy E, MD Taking Active             Home Care and Equipment/Supplies: Were Home Health Services Ordered?: Yes Name of Home Health Agency:: [facility] Has Agency set up a time to come to your home?: No (delivered at the hospital) EMR reviewed  for Home Health Orders: Home Health Not Ordered Any new equipment or medical supplies ordered?: Yes Name of Medical supply agency?: see above Were you able to get the equipment/medical supplies?: Yes Do you have any questions related to the use of the equipment/supplies?: No  Functional Questionnaire: Do you need assistance with bathing/showering or dressing?: Yes Do you need assistance with meal preparation?: Yes Do you need assistance with eating?: No Do you have difficulty maintaining continence: No Do you need assistance with getting out of bed/getting out of a chair/moving?: No Do you have difficulty managing or taking your medications?: No Encouraged patient to be active to avoid constipation.   Follow up appointments reviewed: PCP Follow-up appointment confirmed?: No (wife declines me getting patient an appointment.) MD Provider Line Number:[phone] Given: No Specialist Hospital Follow-up appointment confirmed?: Yes Date of Specialist follow-up appointment?: 09/21/22 Follow-Up Specialty Provider:: Clint Bolder Do you need transportation to your follow-up appointment?: No Do you understand care options if your condition(s) worsen?: Yes-patient verbalized understanding  SDOH Interventions Today    Flowsheet Row Most Recent Value  SDOH Interventions   Food Insecurity Interventions Intervention Not Indicated  Transportation Interventions Intervention Not Indicated      Interventions Today    Flowsheet Row Most Recent Value  Chronic Disease   Chronic disease during today's visit Other  [left knee replacement.]  General Interventions   General Interventions Discussed/Reviewed General Interventions Discussed, Doctor Visits  Exercise Interventions   Exercise Discussed/Reviewed Exercise Discussed, Assistive device use and maintanence  [patient cancelled PT today because he did not feel like going. Reviewed imprortance of being active after knee replacement. Patient using  walker.  Reports that he is comfortable sitting. Encouraged patient to get up every hour to move.]  Education Interventions   Education Provided Provided Education  [Patient inquirinf igf he can have home health PT.  I encouraged paitent and wife to call surgeon. Currently scheduled for outpatient PT. Reviewed importance of getting bowels to move. No BM since surgery.]  Provided Verbal Education On Nutrition, Medication, Exercise, When to see the doctor  Nutrition Interventions   Nutrition Discussed/Reviewed Nutrition Discussed  Pharmacy Interventions   Pharmacy Dicussed/Reviewed Medications and their functions  Safety Interventions   Safety Discussed/Reviewed Fall Risk, Home Safety        Rowe Pavy, RN, BSN, CEN [facility] Coordinator [phone]

## 2022-09-12 ENCOUNTER — Encounter: Payer: Self-pay | Admitting: Cardiology

## 2022-09-13 ENCOUNTER — Encounter (HOSPITAL_COMMUNITY): Payer: Self-pay | Admitting: Orthopedic Surgery

## 2022-09-13 DIAGNOSIS — M25562 Pain in left knee: Secondary | ICD-10-CM | POA: Diagnosis not present

## 2022-09-13 NOTE — Anesthesia Postprocedure Evaluation (Signed)
Anesthesia Post Note  Patient: Gregory [name]  Procedure(s) Performed: TOTAL KNEE ARTHROPLASTY (Left: Knee)     Patient location during evaluation: PACU Anesthesia Type: Regional, Spinal and MAC Level of consciousness: awake and alert Pain management: pain level controlled Vital Signs Assessment: post-procedure vital signs reviewed and stable Respiratory status: spontaneous breathing, nonlabored ventilation and respiratory function stable Cardiovascular status: stable and blood pressure returned to baseline Postop Assessment: no apparent nausea or vomiting Anesthetic complications: no   No notable events documented.  Last Vitals:  Vitals:   09/08/22 2230 09/09/22 0619  BP: 106/61 122/64  Pulse: 89 83  Resp: 18 20  Temp: 37.3 C 37.3 C  SpO2: 92% 92%    Last Pain:  Vitals:   09/09/22 1100  TempSrc:   PainSc: 3                  Mar Zettler

## 2022-09-14 ENCOUNTER — Encounter: Payer: Self-pay | Admitting: Family Medicine

## 2022-09-14 MED ORDER — TRAMADOL HCL 50 MG PO TABS: 50.0000 mg | ORAL_TABLET | Freq: Three times a day (TID) | ORAL | 0 refills | Status: DC | PRN

## 2022-09-14 NOTE — Telephone Encounter (Signed)
Last office visit 07/26/2022 for decreased renal function.  Last refilled 07/10/2022 for #90 with no refills.  Next Appt: No future appointments with PCP.

## 2022-09-17 DIAGNOSIS — M25562 Pain in left knee: Secondary | ICD-10-CM | POA: Diagnosis not present

## 2022-09-20 DIAGNOSIS — M25562 Pain in left knee: Secondary | ICD-10-CM | POA: Diagnosis not present

## 2022-09-21 DIAGNOSIS — Z471 Aftercare following joint replacement surgery: Secondary | ICD-10-CM | POA: Diagnosis not present

## 2022-09-21 DIAGNOSIS — Z96652 Presence of left artificial knee joint: Secondary | ICD-10-CM | POA: Diagnosis not present

## 2022-09-24 ENCOUNTER — Ambulatory Visit (INDEPENDENT_AMBULATORY_CARE_PROVIDER_SITE_OTHER): Payer: Medicare HMO | Admitting: Family Medicine

## 2022-09-24 ENCOUNTER — Encounter: Payer: Self-pay | Admitting: Family Medicine

## 2022-09-24 VITALS — BP 112/56 | HR 75 | Temp 97.2°F | Ht 66.0 in | Wt 210.0 lb

## 2022-09-24 DIAGNOSIS — M25572 Pain in left ankle and joints of left foot: Secondary | ICD-10-CM | POA: Diagnosis not present

## 2022-09-24 DIAGNOSIS — M1A09X0 Idiopathic chronic gout, multiple sites, without tophus (tophi): Secondary | ICD-10-CM

## 2022-09-24 DIAGNOSIS — M25562 Pain in left knee: Secondary | ICD-10-CM | POA: Diagnosis not present

## 2022-09-24 LAB — CBC WITH DIFFERENTIAL/PLATELET
Basophils Absolute: 0.1 10*3/uL (ref 0.0–0.1)
Basophils Relative: 0.9 % (ref 0.0–3.0)
Eosinophils Absolute: 0.1 10*3/uL (ref 0.0–0.7)
Eosinophils Relative: 1.6 % (ref 0.0–5.0)
HCT: 30 % — ABNORMAL LOW (ref 39.0–52.0)
Hemoglobin: 9.5 g/dL — ABNORMAL LOW (ref 13.0–17.0)
Lymphocytes Relative: 24.2 % (ref 12.0–46.0)
Lymphs Abs: 2 10*3/uL (ref 0.7–4.0)
MCHC: 31.8 g/dL (ref 30.0–36.0)
MCV: 104.5 fl — ABNORMAL HIGH (ref 78.0–100.0)
Monocytes Absolute: 0.9 10*3/uL (ref 0.1–1.0)
Monocytes Relative: 10.9 % (ref 3.0–12.0)
Neutro Abs: 5.1 10*3/uL (ref 1.4–7.7)
Neutrophils Relative %: 62.4 % (ref 43.0–77.0)
Platelets: 454 10*3/uL — ABNORMAL HIGH (ref 150.0–400.0)
RBC: 2.87 Mil/uL — ABNORMAL LOW (ref 4.22–5.81)
RDW: 14.5 % (ref 11.5–15.5)
WBC: 8.1 10*3/uL (ref 4.0–10.5)

## 2022-09-24 LAB — URIC ACID: Uric Acid, Serum: 6.4 mg/dL (ref 4.0–7.8)

## 2022-09-24 NOTE — Assessment & Plan Note (Signed)
Acute increase in left knee postop.  No significant redness or swelling at this point but given pain in the knee increasing we will check a CBC to evaluate white blood cell count.  Does not clearly appear infected but will have patient consider following up early with Ortho if pain not improving. Hold physical therapy for today.

## 2022-09-24 NOTE — Assessment & Plan Note (Signed)
Acute, most likely secondary to ligament irritation/osteoarthritis flare from recent increase in physical therapy for left knee status post total knee replacement. Appearance of left ankle does not appear consistent with gout given no redness and swelling in ankle.  He does have significant decrease in mobility of left ankle. Will evaluate with uric acid for possible gout flare. He will continue Tylenol, NSAIDs are contraindicated.  He can continue colchicine twice daily for now.

## 2022-09-24 NOTE — Progress Notes (Signed)
Patient ID: Gregory [name], male    DOB: July 25, 1950, 72 y.o.   MRN: [id number]  This visit was conducted in person.  BP (!) 112/56   Pulse 75   Temp (!) 97.2 F (36.2 C) (Temporal)   Ht 5\' 6"  (1.676 m)   Wt 210 lb (95.3 kg)   SpO2 97%   BMI 33.89 kg/m    CC:  Chief Complaint  Patient presents with   Knee Pain    Left knee and ankle pain x 3 days     Subjective:   HPI: Gregory [name] is a 72 y.o. male presenting on 09/24/2022 for Knee Pain (Left knee and ankle pain x 3 days )  Acute onset left knee and left ankle pain x 3 days.  He has been doing PT for knee replacement.. toe lifts.  Ankle started hurting 3 days ago. No swelling or redness in ankle. Wears compression hose.  Now pain  and some redness and heat in left knee... per ORTHO follow up 9/20 doing well, also heat in left knee may be some better but pain interfering with PT.   No fever.  No flu like symptoms.   Has take colchicine  in last 2 days, 2 a day... minimal improvement.   PA [facility], Dr. Linna Caprice   Recent total knee replacement on September 06, 2022 for osteoarthritis. History of decreased renal function.  Atrial fibrillation, status post aortic valve replacement, dilated cardiomyopathy, maintained on Lasix diuretic. Has history of gout, controlled on allopurinol, in the past using colchicine as needed for flares.  Relevant past medical, surgical, family and social history reviewed and updated as indicated. Interim medical history since our last visit reviewed. Allergies and medications reviewed and updated. Outpatient Medications Prior to Visit  Medication Sig Dispense Refill   acetaminophen (TYLENOL) 500 MG tablet Take 1,000 mg by mouth every 6 (six) hours as needed for moderate pain.     allopurinol (ZYLOPRIM) 100 MG tablet Take 1 tablet by mouth once daily 90 tablet 1   apixaban (ELIQUIS) 5 MG TABS tablet Take 1 tablet (5 mg total) by mouth 2 (two) times daily. 60 tablet 6    atorvastatin (LIPITOR) 80 MG tablet Take 1 tablet by mouth once daily 90 tablet 3   furosemide (LASIX) 20 MG tablet TAKE TWO TABLETS BY MOUTH IN THE MORNING AND TAKE ONE TABLET BY MOUTH IN THE AFTERNOON 270 tablet 3   ketoconazole (NIZORAL) 2 % shampoo Apply 1 application topically 2 (two) times a week. (Patient taking differently: Apply 1 application  topically daily as needed for irritation.) 120 mL 1   latanoprost (XALATAN) 0.005 % ophthalmic solution Place 1 drop into both eyes every morning.      metoprolol succinate (TOPROL-XL) 25 MG 24 hr tablet Take 1 tablet by mouth twice daily 180 tablet 0   Misc Natural Products (OSTEO BI-FLEX/5-LOXIN ADVANCED) TABS Take 2 tablets by mouth daily.     ondansetron (ZOFRAN) 4 MG tablet Take 1 tablet (4 mg total) by mouth every 8 (eight) hours as needed for nausea or vomiting. 30 tablet 0   potassium chloride SA (KLOR-CON M20) 20 MEQ tablet Take 1 tablet by mouth once daily 90 tablet 3   sacubitril-valsartan (ENTRESTO) 97-103 MG Take 1 tablet by mouth twice daily 180 tablet 0   sertraline (ZOLOFT) 50 MG tablet Take 1 tablet by mouth once daily 90 tablet 3   spironolactone (ALDACTONE) 25 MG tablet Take 1 tablet by mouth  once daily 90 tablet 2   traMADol (ULTRAM) 50 MG tablet Take 1 tablet (50 mg total) by mouth every 8 (eight) hours as needed. for pain 90 tablet 0   traZODone (DESYREL) 50 MG tablet TAKE 1/2 TO 1 (ONE-HALF TO ONE) TABLET BY MOUTH AT BEDTIME AS NEEDED FOR SLEEP 90 tablet 1   ALPRAZolam (XANAX) 0.5 MG tablet TAKE 1 TABLET BY MOUTH ONCE DAILY AS  NEEDED  FOR  ANXIETY  TAKE  30  MINUTES  PRIOR  TO  PLANE FLIGHT OR AS NEEDED FOR ANXIETY (Patient not taking: Reported on 09/10/2022) 20 tablet 0   colchicine 0.6 MG tablet Take 2 tablets by mouth once daily (Patient not taking: Reported on 09/24/2022) 60 tablet 2   docusate sodium (COLACE) 100 MG capsule Take 1 capsule (100 mg total) by mouth 2 (two) times daily. (Patient not taking: Reported on 09/24/2022)  60 capsule 0   JATENZO 237 MG CAPS Take 237 mg by mouth 2 (two) times daily. (Patient not taking: Reported on 09/10/2022)     oxyCODONE (ROXICODONE) 5 MG immediate release tablet Take 1 tablet (5 mg total) by mouth every 4 (four) hours as needed for severe pain. (Patient not taking: Reported on 09/24/2022) 40 tablet 0   polyethylene glycol (MIRALAX) 17 g packet Take 17 g by mouth daily as needed for mild constipation or moderate constipation. (Patient not taking: Reported on 09/24/2022) 14 each 0   Testosterone 1.62 % GEL Apply 2 Pump topically daily. (Patient not taking: Reported on 09/24/2022)     No facility-administered medications prior to visit.     Per HPI unless specifically indicated in ROS section below Review of Systems  Constitutional:  Negative for fatigue and fever.  HENT:  Negative for ear pain.   Eyes:  Negative for pain.  Respiratory:  Negative for cough and shortness of breath.   Cardiovascular:  Negative for chest pain, palpitations and leg swelling.  Gastrointestinal:  Negative for abdominal pain.  Genitourinary:  Negative for dysuria.  Musculoskeletal:  Negative for arthralgias.  Neurological:  Negative for syncope, light-headedness and headaches.  Psychiatric/Behavioral:  Negative for dysphoric mood.    Objective:  BP (!) 112/56   Pulse 75   Temp (!) 97.2 F (36.2 C) (Temporal)   Ht 5\' 6"  (1.676 m)   Wt 210 lb (95.3 kg)   SpO2 97%   BMI 33.89 kg/m   Wt Readings from Last 3 Encounters:  09/24/22 210 lb (95.3 kg)  09/06/22 219 lb (99.3 kg)  08/30/22 222 lb (100.7 kg)      Physical Exam Constitutional:      Appearance: He is well-developed.  HENT:     Head: Normocephalic.     Right Ear: Hearing normal.     Left Ear: Hearing normal.     Nose: Nose normal.  Neck:     Thyroid: No thyroid mass or thyromegaly.     Vascular: No carotid bruit.     Trachea: Trachea normal.  Cardiovascular:     Rate and Rhythm: Normal rate and regular rhythm.     Pulses:  Normal pulses.     Heart sounds: Heart sounds not distant. No murmur heard.    No friction rub. No gallop.     Comments: No peripheral edema Pulmonary:     Effort: Pulmonary effort is normal. No respiratory distress.     Breath sounds: Normal breath sounds.  Skin:    General: Skin is warm and dry.  Findings: No rash.  Psychiatric:        Speech: Speech normal.        Behavior: Behavior normal.        Thought Content: Thought content normal.       Results for orders placed or performed in visit on 09/24/22  CBC with Differential/Platelet  Result Value Ref Range   WBC 8.1 4.0 - 10.5 K/uL   RBC 2.87 (L) 4.22 - 5.81 Mil/uL   Hemoglobin 9.5 (L) 13.0 - 17.0 g/dL   HCT 78.2 (L) 95.6 - 21.3 %   MCV 104.5 (H) 78.0 - 100.0 fl   MCHC 31.8 30.0 - 36.0 g/dL   RDW 08.6 57.8 - 46.9 %   Platelets 454.0 (H) 150.0 - 400.0 K/uL   Neutrophils Relative % 62.4 43.0 - 77.0 %   Lymphocytes Relative 24.2 12.0 - 46.0 %   Monocytes Relative 10.9 3.0 - 12.0 %   Eosinophils Relative 1.6 0.0 - 5.0 %   Basophils Relative 0.9 0.0 - 3.0 %   Neutro Abs 5.1 1.4 - 7.7 K/uL   Lymphs Abs 2.0 0.7 - 4.0 K/uL   Monocytes Absolute 0.9 0.1 - 1.0 K/uL   Eosinophils Absolute 0.1 0.0 - 0.7 K/uL   Basophils Absolute 0.1 0.0 - 0.1 K/uL  Uric acid  Result Value Ref Range   Uric Acid, Serum 6.4 4.0 - 7.8 mg/dL    Assessment and Plan  Acute left ankle pain Assessment & Plan: Acute, most likely secondary to ligament irritation/osteoarthritis flare from recent increase in physical therapy for left knee status post total knee replacement. Appearance of left ankle does not appear consistent with gout given no redness and swelling in ankle.  He does have significant decrease in mobility of left ankle. Will evaluate with uric acid for possible gout flare. He will continue Tylenol, NSAIDs are contraindicated.  He can continue colchicine twice daily for now.  Orders: -     CBC with Differential/Platelet -     Uric  acid  Acute pain of left knee Assessment & Plan: Acute increase in left knee postop.  No significant redness or swelling at this point but given pain in the knee increasing we will check a CBC to evaluate white blood cell count.  Does not clearly appear infected but will have patient consider following up early with Ortho if pain not improving. Hold physical therapy for today.  Orders: -     CBC with Differential/Platelet -     Uric acid  Chronic gout of multiple sites, unspecified cause -     CBC with Differential/Platelet -     Uric acid    No follow-ups on file.   [name], MD

## 2022-09-27 ENCOUNTER — Inpatient Hospital Stay: Payer: Medicare HMO | Attending: Hematology and Oncology

## 2022-09-27 ENCOUNTER — Inpatient Hospital Stay: Payer: Medicare HMO

## 2022-09-27 ENCOUNTER — Inpatient Hospital Stay: Payer: Medicare HMO | Admitting: Hematology and Oncology

## 2022-09-27 DIAGNOSIS — M25562 Pain in left knee: Secondary | ICD-10-CM | POA: Diagnosis not present

## 2022-09-27 NOTE — Assessment & Plan Note (Deleted)
Elevated LDH with normal direct Coombs test: Concern for intravascular hemolysis possibly from severe valvular heart disease   11/17/2020: Hemoglobin 9.6, WBC 3.3, platelets 142, ANC 1.9, ferritin 158, iron saturation 31% (improv from 5%) 01/13/21: Hb 9.9, WBC: 5.5, MCV 105.6, Platelet 165, TB 1.3, Ferritin 50, Iron Sat: 25%,  01/16/2021: Hemoglobin 9.2, MCV 105.1, B12 117 (start B12 injections) 02/13/2021: Hemoglobin 10, MCV 103.1,   06/20/2021: Hemoglobin 10, MCV 100.3  09/12/2021: Hemoglobin 11.1 01/02/2022: Hemoglobin 10.8 (Retacrit injection started) 02/15/2022: Hemoglobin 10.7 (Retacrit changed to every 2 weeks) 03/15/2022: Hemoglobin 9.9, MCV 103.3 03/22/2022: Hemoglobin 9.6, MCV 101.7 04/30/2022: Hemoglobin 11, MCV 101.2 05/08/2022: Hemoglobin 11, MCV 103 08/07/2022: Hemoglobin 10.1, MCV 105.3  Left total knee arthroplasty: 09/06/2022 Current treatment: Retacrit along with B12 injections every 3 weeks

## 2022-10-01 DIAGNOSIS — M25562 Pain in left knee: Secondary | ICD-10-CM | POA: Diagnosis not present

## 2022-10-04 DIAGNOSIS — M25562 Pain in left knee: Secondary | ICD-10-CM | POA: Diagnosis not present

## 2022-10-08 DIAGNOSIS — M25562 Pain in left knee: Secondary | ICD-10-CM | POA: Diagnosis not present

## 2022-10-08 DIAGNOSIS — E291 Testicular hypofunction: Secondary | ICD-10-CM | POA: Diagnosis not present

## 2022-10-10 ENCOUNTER — Other Ambulatory Visit: Payer: Self-pay | Admitting: Family

## 2022-10-11 DIAGNOSIS — M25562 Pain in left knee: Secondary | ICD-10-CM | POA: Diagnosis not present

## 2022-10-15 DIAGNOSIS — M25562 Pain in left knee: Secondary | ICD-10-CM | POA: Diagnosis not present

## 2022-10-17 ENCOUNTER — Other Ambulatory Visit: Payer: Self-pay | Admitting: *Deleted

## 2022-10-17 NOTE — Telephone Encounter (Signed)
Last office visit 09/24/2022 for knee pain.  Last refilled 09/14/2022 for #90 with no refills. Next Appt: No future appointments with PCP.

## 2022-10-18 ENCOUNTER — Ambulatory Visit (INDEPENDENT_AMBULATORY_CARE_PROVIDER_SITE_OTHER): Payer: Medicare HMO

## 2022-10-18 DIAGNOSIS — M25562 Pain in left knee: Secondary | ICD-10-CM | POA: Diagnosis not present

## 2022-10-18 DIAGNOSIS — I420 Dilated cardiomyopathy: Secondary | ICD-10-CM

## 2022-10-18 DIAGNOSIS — I480 Paroxysmal atrial fibrillation: Secondary | ICD-10-CM

## 2022-10-18 LAB — CUP PACEART REMOTE DEVICE CHECK
Battery Remaining Longevity: 59 mo
Battery Remaining Percentage: 71 %
Battery Voltage: 2.95 V
Brady Statistic AP VP Percent: 88 %
Brady Statistic AP VS Percent: 1 %
Brady Statistic AS VP Percent: 12 %
Brady Statistic AS VS Percent: 1 %
Brady Statistic RA Percent Paced: 88 %
Date Time Interrogation Session: 20241017020133
HighPow Impedance: 68 Ohm
Implantable Lead Connection Status: 753985
Implantable Lead Connection Status: 753985
Implantable Lead Connection Status: 753985
Implantable Lead Implant Date: 20221020
Implantable Lead Implant Date: 20221020
Implantable Lead Implant Date: 20221020
Implantable Lead Location: 753858
Implantable Lead Location: 753859
Implantable Lead Location: 753860
Implantable Pulse Generator Implant Date: 20221020
Lead Channel Impedance Value: 360 Ohm
Lead Channel Impedance Value: 440 Ohm
Lead Channel Impedance Value: 730 Ohm
Lead Channel Pacing Threshold Amplitude: 0.5 V
Lead Channel Pacing Threshold Amplitude: 0.75 V
Lead Channel Pacing Threshold Amplitude: 1.75 V
Lead Channel Pacing Threshold Pulse Width: 0.5 ms
Lead Channel Pacing Threshold Pulse Width: 0.5 ms
Lead Channel Pacing Threshold Pulse Width: 0.7 ms
Lead Channel Sensing Intrinsic Amplitude: 1.6 mV
Lead Channel Sensing Intrinsic Amplitude: 12 mV
Lead Channel Setting Pacing Amplitude: 1.5 V
Lead Channel Setting Pacing Amplitude: 2.25 V
Lead Channel Setting Pacing Amplitude: 2.5 V
Lead Channel Setting Pacing Pulse Width: 0.5 ms
Lead Channel Setting Pacing Pulse Width: 0.7 ms
Lead Channel Setting Sensing Sensitivity: 0.5 mV
Pulse Gen Serial Number: 111052020
Zone Setting Status: 755011

## 2022-10-18 MED ORDER — TRAMADOL HCL 50 MG PO TABS: 50.0000 mg | ORAL_TABLET | Freq: Three times a day (TID) | ORAL | 0 refills | Status: DC | PRN

## 2022-10-19 DIAGNOSIS — M25562 Pain in left knee: Secondary | ICD-10-CM | POA: Diagnosis not present

## 2022-10-19 DIAGNOSIS — Z471 Aftercare following joint replacement surgery: Secondary | ICD-10-CM | POA: Diagnosis not present

## 2022-10-19 DIAGNOSIS — Z96652 Presence of left artificial knee joint: Secondary | ICD-10-CM | POA: Diagnosis not present

## 2022-10-22 ENCOUNTER — Other Ambulatory Visit: Payer: Self-pay | Admitting: Family Medicine

## 2022-10-22 DIAGNOSIS — M25562 Pain in left knee: Secondary | ICD-10-CM | POA: Diagnosis not present

## 2022-10-23 ENCOUNTER — Other Ambulatory Visit: Payer: Self-pay | Admitting: *Deleted

## 2022-10-23 DIAGNOSIS — D539 Nutritional anemia, unspecified: Secondary | ICD-10-CM

## 2022-10-25 ENCOUNTER — Inpatient Hospital Stay: Payer: Medicare HMO

## 2022-10-25 ENCOUNTER — Inpatient Hospital Stay: Payer: Medicare HMO | Attending: Hematology and Oncology

## 2022-10-25 DIAGNOSIS — Z79899 Other long term (current) drug therapy: Secondary | ICD-10-CM | POA: Insufficient documentation

## 2022-10-25 DIAGNOSIS — Z96652 Presence of left artificial knee joint: Secondary | ICD-10-CM | POA: Insufficient documentation

## 2022-10-25 DIAGNOSIS — M25562 Pain in left knee: Secondary | ICD-10-CM | POA: Diagnosis not present

## 2022-10-25 DIAGNOSIS — D539 Nutritional anemia, unspecified: Secondary | ICD-10-CM | POA: Insufficient documentation

## 2022-10-25 DIAGNOSIS — E291 Testicular hypofunction: Secondary | ICD-10-CM | POA: Insufficient documentation

## 2022-10-30 DIAGNOSIS — M25562 Pain in left knee: Secondary | ICD-10-CM | POA: Diagnosis not present

## 2022-11-01 ENCOUNTER — Other Ambulatory Visit: Payer: Self-pay

## 2022-11-01 ENCOUNTER — Inpatient Hospital Stay: Payer: Medicare HMO

## 2022-11-01 ENCOUNTER — Inpatient Hospital Stay: Payer: Medicare HMO | Admitting: Hematology and Oncology

## 2022-11-01 VITALS — BP 105/65 | HR 79 | Temp 97.5°F | Resp 18 | Ht 66.0 in | Wt 214.5 lb

## 2022-11-01 DIAGNOSIS — E291 Testicular hypofunction: Secondary | ICD-10-CM | POA: Diagnosis not present

## 2022-11-01 DIAGNOSIS — E538 Deficiency of other specified B group vitamins: Secondary | ICD-10-CM

## 2022-11-01 DIAGNOSIS — M25562 Pain in left knee: Secondary | ICD-10-CM | POA: Diagnosis not present

## 2022-11-01 DIAGNOSIS — Z96652 Presence of left artificial knee joint: Secondary | ICD-10-CM | POA: Diagnosis not present

## 2022-11-01 DIAGNOSIS — D539 Nutritional anemia, unspecified: Secondary | ICD-10-CM

## 2022-11-01 DIAGNOSIS — Z79899 Other long term (current) drug therapy: Secondary | ICD-10-CM | POA: Diagnosis not present

## 2022-11-01 LAB — CBC WITH DIFFERENTIAL (CANCER CENTER ONLY)
Abs Immature Granulocytes: 0.03 10*3/uL (ref 0.00–0.07)
Basophils Absolute: 0 10*3/uL (ref 0.0–0.1)
Basophils Relative: 0 %
Eosinophils Absolute: 0.1 10*3/uL (ref 0.0–0.5)
Eosinophils Relative: 1 %
HCT: 29.5 % — ABNORMAL LOW (ref 39.0–52.0)
Hemoglobin: 9.5 g/dL — ABNORMAL LOW (ref 13.0–17.0)
Immature Granulocytes: 1 %
Lymphocytes Relative: 28 %
Lymphs Abs: 1.5 10*3/uL (ref 0.7–4.0)
MCH: 33.8 pg (ref 26.0–34.0)
MCHC: 32.2 g/dL (ref 30.0–36.0)
MCV: 105 fL — ABNORMAL HIGH (ref 80.0–100.0)
Monocytes Absolute: 0.5 10*3/uL (ref 0.1–1.0)
Monocytes Relative: 10 %
Neutro Abs: 3.3 10*3/uL (ref 1.7–7.7)
Neutrophils Relative %: 60 %
Platelet Count: 220 10*3/uL (ref 150–400)
RBC: 2.81 MIL/uL — ABNORMAL LOW (ref 4.22–5.81)
RDW: 14.4 % (ref 11.5–15.5)
WBC Count: 5.4 10*3/uL (ref 4.0–10.5)
nRBC: 0 % (ref 0.0–0.2)

## 2022-11-01 LAB — CMP (CANCER CENTER ONLY)
ALT: 15 U/L (ref 0–44)
AST: 47 U/L — ABNORMAL HIGH (ref 15–41)
Albumin: 4.2 g/dL (ref 3.5–5.0)
Alkaline Phosphatase: 47 U/L (ref 38–126)
Anion gap: 3 — ABNORMAL LOW (ref 5–15)
BUN: 22 mg/dL (ref 8–23)
CO2: 32 mmol/L (ref 22–32)
Calcium: 9.1 mg/dL (ref 8.9–10.3)
Chloride: 105 mmol/L (ref 98–111)
Creatinine: 0.94 mg/dL (ref 0.61–1.24)
GFR, Estimated: >60 mL/min (ref >60)
Glucose, Bld: 94 mg/dL (ref 70–99)
Potassium: 4.2 mmol/L (ref 3.5–5.1)
Sodium: 140 mmol/L (ref 135–145)
Total Bilirubin: 1.1 mg/dL (ref 0.3–1.2)
Total Protein: 6.6 g/dL (ref 6.5–8.1)

## 2022-11-01 MED ORDER — CYANOCOBALAMIN 1000 MCG/ML IJ SOLN
1000.0000 ug | Freq: Once | INTRAMUSCULAR | Status: AC
Start: 2022-11-01 — End: 2022-11-01
  Administered 2022-11-01: 1000 ug via INTRAMUSCULAR
  Filled 2022-11-01: qty 1

## 2022-11-01 NOTE — Patient Instructions (Signed)
 Vitamin B12 Injection What is this medication? Vitamin B12 (VAHY tuh min B12) prevents and treats low vitamin B12 levels in your body. It is used in people who do not get enough vitamin B12 from their diet or when their digestive tract does not absorb enough. Vitamin B12 plays an important role in maintaining the health of your nervous system and red blood cells. This medicine may be used for other purposes; ask your health care provider or pharmacist if you have questions. COMMON BRAND NAME(S): B-12 Compliance Kit, B-12 Injection Kit, Cyomin, Dodex, LA-12, Nutri-Twelve, Physicians EZ Use B-12, Primabalt, Vitamin Deficiency Injectable System - B12 What should I tell my care team before I take this medication? They need to know if you have any of these conditions: Kidney disease Leber's disease Megaloblastic anemia An unusual or allergic reaction to cyanocobalamin, cobalt, other medications, foods, dyes, or preservatives Pregnant or trying to get pregnant Breast-feeding How should I use this medication? This medication is injected into a muscle or deeply under the skin. It is usually given in a clinic or care team's office. However, your care team may teach you how to inject yourself. Follow all instructions. Talk to your care team about the use of this medication in children. Special care may be needed. Overdosage: If you think you have taken too much of this medicine contact a poison control center or emergency room at once. NOTE: This medicine is only for you. Do not share this medicine with others. What if I miss a dose? If you are given your dose at a clinic or care team's office, call to reschedule your appointment. If you give your own injections, and you miss a dose, take it as soon as you can. If it is almost time for your next dose, take only that dose. Do not take double or extra doses. What may interact with this medication? Alcohol Colchicine This list may not describe all possible  interactions. Give your health care provider a list of all the medicines, herbs, non-prescription drugs, or dietary supplements you use. Also tell them if you smoke, drink alcohol, or use illegal drugs. Some items may interact with your medicine. What should I watch for while using this medication? Visit your care team regularly. You may need blood work done while you are taking this medication. You may need to follow a special diet. Talk to your care team. Limit your alcohol intake and avoid smoking to get the best benefit. What side effects may I notice from receiving this medication? Side effects that you should report to your care team as soon as possible: Allergic reactions--skin rash, itching, hives, swelling of the face, lips, tongue, or throat Swelling of the ankles, hands, or feet Trouble breathing Side effects that usually do not require medical attention (report to your care team if they continue or are bothersome): Diarrhea This list may not describe all possible side effects. Call your doctor for medical advice about side effects. You may report side effects to FDA at 1-800-FDA-1088. Where should I keep my medication? Keep out of the reach of children. Store at room temperature between 15 and 30 degrees C (59 and 85 degrees F). Protect from light. Throw away any unused medication after the expiration date. NOTE: This sheet is a summary. It may not cover all possible information. If you have questions about this medicine, talk to your doctor, pharmacist, or health care provider.  2024 Elsevier/Gold Standard (2020-08-30 00:00:00)

## 2022-11-01 NOTE — Assessment & Plan Note (Signed)
Elevated LDH with normal direct Coombs test: Concern for intravascular hemolysis possibly from severe valvular heart disease   11/17/2020: Hemoglobin 9.6, WBC 3.3, platelets 142, ANC 1.9, ferritin 158, iron saturation 31% (improv from 5%) 01/13/21: Hb 9.9, WBC: 5.5, MCV 105.6, Platelet 165, TB 1.3, Ferritin 50, Iron Sat: 25%,  01/16/2021: Hemoglobin 9.2, MCV 105.1, B12 117 (start B12 injections) 02/13/2021: Hemoglobin 10, MCV 103.1,   06/20/2021: Hemoglobin 10, MCV 100.3  09/12/2021: Hemoglobin 11.1 01/02/2022: Hemoglobin 10.8 (Retacrit injection started) 02/15/2022: Hemoglobin 10.7 (Retacrit changed to every 2 weeks) 03/15/2022: Hemoglobin 9.9, MCV 103.3 08/07/2022: Hemoglobin 10.1, MCV 105.3 11/01/2022:   Left total knee arthroplasty 09/06/2022   B12 injections will be continued as well. Every 3-week injection appointments and every 6 weeks for MD follow-up.

## 2022-11-01 NOTE — Progress Notes (Signed)
Patient Care Team: Excell Seltzer, MD as PCP - General (Family Medicine) Rollene Rotunda, MD as PCP - Cardiology (Cardiology) Lanier Prude, MD as PCP - Electrophysiology (Cardiology) Jodelle Gross, NP as Nurse Practitioner (Cardiology) Kathyrn Sheriff, [facility] (Inactive) as Pharmacist (Pharmacist)  DIAGNOSIS:  Encounter Diagnosis  Name Primary?   Macrocytic anemia Yes   CHIEF COMPLIANT: Follow-up of anemia status post left knee arthroplasty History of Present Illness   S/P Left knee arthroplasty  The patient's anemia has been stable, with a hemoglobin of 9.5, the same as it was a month prior to surgery. He has been receiving B12 and iron shots, but has not been able to take his usual beet juice since the surgery. He is unsure if the shots are making a significant difference to his hemoglobin levels.  He also has low testosterone levels, for which he has tried various treatments, including intramuscular shots and a rub-on medication, but has not found them effective. He is considering trying an EpiPen-type pen, but is deterred by the cost.         ALLERGIES:  is allergic to heparin.  MEDICATIONS:  Current Outpatient Medications  Medication Sig Dispense Refill   acetaminophen (TYLENOL) 500 MG tablet Take 1,000 mg by mouth every 6 (six) hours as needed for moderate pain.     allopurinol (ZYLOPRIM) 100 MG tablet Take 1 tablet by mouth once daily 90 tablet 1   ALPRAZolam (XANAX) 0.5 MG tablet TAKE 1 TABLET BY MOUTH ONCE DAILY AS  NEEDED  FOR  ANXIETY  TAKE  30  MINUTES  PRIOR  TO  PLANE FLIGHT OR AS NEEDED FOR ANXIETY (Patient not taking: Reported on 09/10/2022) 20 tablet 0   apixaban (ELIQUIS) 5 MG TABS tablet Take 1 tablet (5 mg total) by mouth 2 (two) times daily. 60 tablet 6   atorvastatin (LIPITOR) 80 MG tablet Take 1 tablet by mouth once daily 90 tablet 3   colchicine 0.6 MG tablet Take 2 tablets by mouth once daily (Patient not taking: Reported on 09/24/2022) 60  tablet 2   furosemide (LASIX) 20 MG tablet TAKE TWO TABLETS BY MOUTH IN THE MORNING AND TAKE ONE TABLET BY MOUTH IN THE AFTERNOON 270 tablet 3   JATENZO 237 MG CAPS Take 237 mg by mouth 2 (two) times daily. (Patient not taking: Reported on 09/10/2022)     ketoconazole (NIZORAL) 2 % shampoo Apply 1 application topically 2 (two) times a week. (Patient taking differently: Apply 1 application  topically daily as needed for irritation.) 120 mL 1   latanoprost (XALATAN) 0.005 % ophthalmic solution Place 1 drop into both eyes every morning.      metoprolol succinate (TOPROL-XL) 25 MG 24 hr tablet Take 1 tablet by mouth twice daily 180 tablet 0   Misc Natural Products (OSTEO BI-FLEX/5-LOXIN ADVANCED) TABS Take 2 tablets by mouth daily.     ondansetron (ZOFRAN) 4 MG tablet Take 1 tablet (4 mg total) by mouth every 8 (eight) hours as needed for nausea or vomiting. 30 tablet 0   oxyCODONE (ROXICODONE) 5 MG immediate release tablet Take 1 tablet (5 mg total) by mouth every 4 (four) hours as needed for severe pain. (Patient not taking: Reported on 09/24/2022) 40 tablet 0   potassium chloride SA (KLOR-CON M20) 20 MEQ tablet Take 1 tablet by mouth once daily 90 tablet 3   sacubitril-valsartan (ENTRESTO) 97-103 MG Take 1 tablet by mouth twice daily 180 tablet 0   sertraline (ZOLOFT) 50 MG  tablet Take 1 tablet by mouth once daily 90 tablet 3   spironolactone (ALDACTONE) 25 MG tablet Take 1 tablet by mouth once daily 90 tablet 2   Testosterone 1.62 % GEL Apply 2 Pump topically daily. (Patient not taking: Reported on 09/24/2022)     traMADol (ULTRAM) 50 MG tablet Take 1 tablet (50 mg total) by mouth every 8 (eight) hours as needed. for pain 90 tablet 0   traZODone (DESYREL) 50 MG tablet TAKE 1/2 TO 1 (ONE-HALF TO ONE) TABLET BY MOUTH AT BEDTIME AS NEEDED FOR SLEEP 90 tablet 1   No current facility-administered medications for this visit.   Facility-Administered Medications Ordered in Other Visits  Medication Dose Route  Frequency Provider Last Rate Last Admin   cyanocobalamin (VITAMIN B12) injection 1,000 mcg  1,000 mcg Intramuscular Once Serena Croissant, MD        PHYSICAL EXAMINATION: ECOG PERFORMANCE STATUS: 1 - Symptomatic but completely ambulatory  Vitals:   11/01/22 1259  BP: 105/65  Pulse: 79  Resp: 18  Temp: (!) 97.5 F (36.4 C)  SpO2: 99%   Filed Weights   11/01/22 1259  Weight: 214 lb 8 oz (97.3 kg)    Physical Exam          (exam performed in the presence of a chaperone)  LABORATORY DATA:  I have reviewed the data as listed    Latest Ref Rng & Units 11/01/2022   12:26 PM 09/08/2022    3:32 AM 09/07/2022    3:42 AM  CMP  Glucose 70 - 99 mg/dL 94  161  096   BUN 8 - 23 mg/dL 22  17  21    Creatinine 0.61 - 1.24 mg/dL 0.45  4.09  8.11   Sodium 135 - 145 mmol/L 140  135  135   Potassium 3.5 - 5.1 mmol/L 4.2  4.2  4.8   Chloride 98 - 111 mmol/L 105  100  102   CO2 22 - 32 mmol/L 32  29  25   Calcium 8.9 - 10.3 mg/dL 9.1  8.2  7.9   Total Protein 6.5 - 8.1 g/dL 6.6     Total Bilirubin 0.3 - 1.2 mg/dL 1.1     Alkaline Phos 38 - 126 U/L 47     AST 15 - 41 U/L 47     ALT 0 - 44 U/L 15       Lab Results  Component Value Date   WBC 5.4 11/01/2022   HGB 9.5 (L) 11/01/2022   HCT 29.5 (L) 11/01/2022   MCV 105.0 (H) 11/01/2022   PLT 220 11/01/2022   NEUTROABS 3.3 11/01/2022    ASSESSMENT & PLAN:  Macrocytic anemia Elevated LDH with normal direct Coombs test: Concern for intravascular hemolysis possibly from severe valvular heart disease   11/17/2020: Hemoglobin 9.6, WBC 3.3, platelets 142, ANC 1.9, ferritin 158, iron saturation 31% (improv from 5%) 01/13/21: Hb 9.9, WBC: 5.5, MCV 105.6, Platelet 165, TB 1.3, Ferritin 50, Iron Sat: 25%,  01/16/2021: Hemoglobin 9.2, MCV 105.1, B12 117 (start B12 injections) 02/13/2021: Hemoglobin 10, MCV 103.1,   06/20/2021: Hemoglobin 10, MCV 100.3  09/12/2021: Hemoglobin 11.1 01/02/2022: Hemoglobin 10.8 (Retacrit injection started) 02/15/2022:  Hemoglobin 10.7 (Retacrit changed to every 2 weeks) 03/15/2022: Hemoglobin 9.9, MCV 103.3 08/07/2022: Hemoglobin 10.1, MCV 105.3 11/01/2022: Hemoglobin 9.5   Left total knee arthroplasty 09/06/2022   B12 injections will be continued as well. Assessment and Plan    Post-operative recovery Recent surgery with increased pain and  muscle loss. Plans for physical therapy and gym attendance to regain muscle strength. -Continue physical therapy and gym attendance as tolerated.  Anemia Stable hemoglobin at 9.5 post-surgery. Patient has been receiving B12 and iron shots, but has not been consistent with beet juice intake. -Administer B12 and iron shot today. -Check hemoglobin in 6 weeks to assess need for continued shots.  Testosterone deficiency Previous attempts at treatment with intramuscular shots and topical application have not been successful. Patient has not been consistent with treatment due to cost and availability. -Explore affordable and accessible options for testosterone treatment. -Consider potential benefit of testosterone treatment on hemoglobin levels.  Future surgery Patient plans for additional surgery in approximately a year. Current focus is on recovery and muscle strengthening from recent surgery. -Continue current recovery plan and reassess closer to planned surgery date.          Orders Placed This Encounter  Procedures   CBC with Differential (Cancer Center Only)    Standing Status:   Future    Standing Expiration Date:   11/01/2023   Ferritin    Standing Status:   Future    Standing Expiration Date:   11/01/2023   Iron and Iron Binding Capacity ([facility] only)    Standing Status:   Future    Standing Expiration Date:   11/01/2023   The patient has a good understanding of the overall plan. he agrees with it. he will call with any problems that may develop before the next visit here. Total time spent: 30 mins including face to face time and time spent for  planning, charting and co-ordination of care   Tamsen Meek, MD 11/01/22

## 2022-11-06 DIAGNOSIS — M25562 Pain in left knee: Secondary | ICD-10-CM | POA: Diagnosis not present

## 2022-11-06 NOTE — Progress Notes (Signed)
Remote ICD transmission.   

## 2022-11-08 DIAGNOSIS — M25562 Pain in left knee: Secondary | ICD-10-CM | POA: Diagnosis not present

## 2022-12-04 ENCOUNTER — Other Ambulatory Visit: Payer: Self-pay | Admitting: Family Medicine

## 2022-12-04 NOTE — Telephone Encounter (Signed)
Last office visit 09/24/2022 for knee /ankle pain and gout.  Last refilled 10/18/2022 for #90 with no refills.  Next Appt: No future appointments.

## 2022-12-05 ENCOUNTER — Other Ambulatory Visit: Payer: Self-pay | Admitting: Cardiology

## 2022-12-05 ENCOUNTER — Ambulatory Visit (HOSPITAL_COMMUNITY)
Admission: RE | Admit: 2022-12-05 | Discharge: 2022-12-05 | Disposition: A | Discharge status: Home / Self Care | Payer: Medicare HMO | Source: Ambulatory Visit | Attending: Internal Medicine | Admitting: Internal Medicine

## 2022-12-05 VITALS — BP 118/62 | HR 74 | Ht 66.0 in | Wt 220.2 lb

## 2022-12-05 DIAGNOSIS — I2510 Atherosclerotic heart disease of native coronary artery without angina pectoris: Secondary | ICD-10-CM

## 2022-12-05 DIAGNOSIS — Z951 Presence of aortocoronary bypass graft: Secondary | ICD-10-CM | POA: Diagnosis not present

## 2022-12-05 DIAGNOSIS — I110 Hypertensive heart disease with heart failure: Secondary | ICD-10-CM | POA: Diagnosis not present

## 2022-12-05 DIAGNOSIS — E785 Hyperlipidemia, unspecified: Secondary | ICD-10-CM

## 2022-12-05 DIAGNOSIS — Z7901 Long term (current) use of anticoagulants: Secondary | ICD-10-CM | POA: Insufficient documentation

## 2022-12-05 DIAGNOSIS — I4891 Unspecified atrial fibrillation: Secondary | ICD-10-CM | POA: Diagnosis not present

## 2022-12-05 DIAGNOSIS — Z950 Presence of cardiac pacemaker: Secondary | ICD-10-CM | POA: Diagnosis not present

## 2022-12-05 DIAGNOSIS — D6869 Other thrombophilia: Secondary | ICD-10-CM | POA: Insufficient documentation

## 2022-12-05 DIAGNOSIS — D589 Hereditary hemolytic anemia, unspecified: Secondary | ICD-10-CM | POA: Diagnosis not present

## 2022-12-05 DIAGNOSIS — R9431 Abnormal electrocardiogram [ECG] [EKG]: Secondary | ICD-10-CM | POA: Diagnosis not present

## 2022-12-05 DIAGNOSIS — I5042 Chronic combined systolic (congestive) and diastolic (congestive) heart failure: Secondary | ICD-10-CM | POA: Insufficient documentation

## 2022-12-05 DIAGNOSIS — Z952 Presence of prosthetic heart valve: Secondary | ICD-10-CM | POA: Diagnosis not present

## 2022-12-05 DIAGNOSIS — I10 Essential (primary) hypertension: Secondary | ICD-10-CM

## 2022-12-05 DIAGNOSIS — I480 Paroxysmal atrial fibrillation: Secondary | ICD-10-CM | POA: Diagnosis not present

## 2022-12-05 DIAGNOSIS — I5022 Chronic systolic (congestive) heart failure: Secondary | ICD-10-CM

## 2022-12-05 NOTE — Progress Notes (Signed)
Primary Care Physician: Excell Seltzer, MD Primary Cardiologist: Rollene Rotunda, MD Electrophysiologist: Lanier Prude, MD     Referring Physician: Dr. Argentina Ponder Gregory [name] is a 72 y.o. male with a history of CAD and aortic valve disease s/p CABG/AVR 03/06/19, TAA, HTN, obesity, chronic combined CHF, AVB s/p PPM, hemolytic anemia, severe bioprosthetic valve dysfunction with valvuloplasty on 10/22, and paroxysmal atrial fibrillation who presents for consultation in the [facility]. Device interrogation on 7/18 showed Afib as previously noted back in May 2024. Patient is not on anticoagulation. He has a CHADS2VASC score of 3.  On evaluation today, he is currently in NSR. He takes ASA daily. He could not feel episode of Afib and unsure if he has felt palpitations or SOB. He has upcoming knee surgery in September.   On follow up 12/05/22, he is currently in AV paced rhythm. Recent device interrogation showed no sustained arrhythmias with very short episodes; longest 68 seconds. He is still recovering from his left knee replacement and goal is to get back to bicycling 10 miles 5 times a week. He missed a couple doses of blood thinner recently by accident; due to traveling.   Today, he denies symptoms of palpitations, chest pain, shortness of breath, orthopnea, PND, lower extremity edema, dizziness, presyncope, syncope, snoring, daytime somnolence, bleeding, or neurologic sequela. The patient is tolerating medications without difficulties and is otherwise without complaint today.    he has a BMI of Body mass index is 35.54 kg/m.[name] Filed Weights   12/05/22 0904  Weight: 99.9 kg     Current Outpatient Medications  Medication Sig Dispense Refill   acetaminophen (TYLENOL) 500 MG tablet Take 1,000 mg by mouth every 6 (six) hours as needed for moderate pain.     allopurinol (ZYLOPRIM) 100 MG tablet Take 1 tablet by mouth once daily 90 tablet 1    ALPRAZolam (XANAX) 0.5 MG tablet TAKE 1 TABLET BY MOUTH ONCE DAILY AS  NEEDED  FOR  ANXIETY  TAKE  30  MINUTES  PRIOR  TO  PLANE FLIGHT OR AS NEEDED FOR ANXIETY 20 tablet 0   apixaban (ELIQUIS) 5 MG TABS tablet Take 1 tablet (5 mg total) by mouth 2 (two) times daily. 60 tablet 6   atorvastatin (LIPITOR) 80 MG tablet Take 1 tablet by mouth once daily 90 tablet 3   colchicine 0.6 MG tablet Take 2 tablets by mouth once daily (Patient taking differently: Only taking 1 tablet daily) 60 tablet 2   furosemide (LASIX) 20 MG tablet TAKE TWO TABLETS BY MOUTH IN THE MORNING AND TAKE ONE TABLET BY MOUTH IN THE AFTERNOON 270 tablet 3   JATENZO 237 MG CAPS Take 237 mg by mouth 2 (two) times daily.     ketoconazole (NIZORAL) 2 % shampoo Apply 1 application topically 2 (two) times a week. (Patient taking differently: Apply 1 application  topically daily as needed for irritation.) 120 mL 1   latanoprost (XALATAN) 0.005 % ophthalmic solution Place 1 drop into both eyes every morning.      metoprolol succinate (TOPROL-XL) 25 MG 24 hr tablet Take 1 tablet by mouth twice daily 180 tablet 0   Misc Natural Products (OSTEO BI-FLEX/5-LOXIN ADVANCED) TABS Take 2 tablets by mouth daily.     ondansetron (ZOFRAN) 4 MG tablet Take 1 tablet (4 mg total) by mouth every 8 (eight) hours as needed for nausea or vomiting. 30 tablet 0   potassium chloride SA (KLOR-CON  M20) 20 MEQ tablet Take 1 tablet by mouth once daily 90 tablet 3   sacubitril-valsartan (ENTRESTO) 97-103 MG Take 1 tablet by mouth twice daily 180 tablet 0   sertraline (ZOLOFT) 50 MG tablet Take 1 tablet by mouth once daily 90 tablet 3   spironolactone (ALDACTONE) 25 MG tablet Take 1 tablet by mouth once daily 90 tablet 2   traMADol (ULTRAM) 50 MG tablet TAKE 1 TABLET BY MOUTH EVERY 8 HOURS AS NEEDED FOR PAIN (Patient taking differently: Take 50 mg by mouth daily in the afternoon. for pain) 90 tablet 0   traZODone (DESYREL) 50 MG tablet TAKE 1/2 TO 1 (ONE-HALF TO ONE)  TABLET BY MOUTH AT BEDTIME AS NEEDED FOR SLEEP 90 tablet 1   oxyCODONE (ROXICODONE) 5 MG immediate release tablet Take 1 tablet (5 mg total) by mouth every 4 (four) hours as needed for severe pain. (Patient not taking: Reported on 09/24/2022) 40 tablet 0   Testosterone 1.62 % GEL Apply 2 Pump topically daily. (Patient not taking: Reported on 12/05/2022)     No current facility-administered medications for this encounter.    Atrial Fibrillation Management history:  Previous antiarrhythmic drugs: None Previous cardioversions: None Previous ablations: None Anticoagulation history: Eliquis   ROS- All systems are reviewed and negative except as per the HPI above.  Physical Exam: BP 118/62   Pulse 74   Ht 5\' 6"  (1.676 m)   Wt 99.9 kg   BMI 35.54 kg/m   GEN: Well nourished, well developed in no acute distress NECK: No JVD; No carotid bruits CARDIAC: Regular rate and rhythm, no murmurs, rubs, gallops RESPIRATORY:  Clear to auscultation without rales, wheezing or rhonchi  ABDOMEN: Soft, non-tender, non-distended EXTREMITIES:  No edema; No deformity   EKG today demonstrates  Vent. rate 74 BPM PR interval 220 ms QRS duration 148 ms QT/QTcB 450/499 ms P-R-T axes * -53 101 AV dual-paced rhythm with prolonged AV conduction Abnormal ECG When compared with ECG of 08-Aug-2022 08:47, PREVIOUS ECG IS PRESENT  Echo 12/01/21 demonstrated   1. Left ventricular ejection fraction, by estimation, is 60 to 65%. The  left ventricle has normal function. The left ventricle has no regional  wall motion abnormalities. There is mild left ventricular hypertrophy.  Left ventricular diastolic parameters  are consistent with Grade I diastolic dysfunction (impaired relaxation).   2. Right ventricular systolic function is normal. The right ventricular  size is normal.   3. The mitral valve is normal in structure. No evidence of mitral valve  regurgitation. No evidence of mitral stenosis.   4. The aortic  valve is normal in structure. Aortic valve regurgitation is  mild. No aortic stenosis is present. Aortic valve mean gradient measures  13.0 mmHg. Aortic valve Vmax measures 2.42 m/s.   5. Aortic dilatation noted. There is moderate dilatation of the ascending  aorta, measuring 45 mm.   6. The inferior vena cava is normal in size with greater than 50%  respiratory variability, suggesting right atrial pressure of 3 mmHg.    ASSESSMENT & PLAN CHA2DS2-VASc Score = 3  The patient's score is based upon: CHF History: 0 HTN History: 1 Diabetes History: 0 Stroke History: 0 Vascular Disease History: 1 Age Score: 1 Gender Score: 0       ASSESSMENT AND PLAN: Paroxysmal Atrial Fibrillation (ICD10:  I48.0) The patient's CHA2DS2-VASc score is 3, indicating a 3.2% annual risk of stroke.    He is currently in NSR.  Continue conservative observation without medication change.  Secondary Hypercoagulable State (ICD10:  D68.69) The patient is at significant risk for stroke/thromboembolism based upon his CHA2DS2-VASc Score of 3.  Start Apixaban (Eliquis).  Continue Eliquis 5 mg BID.    Follow up 6 months with EP.    Gregory Bells, PA-C  [facility] [address] [place], [place] [address] [phone]

## 2022-12-11 ENCOUNTER — Ambulatory Visit (INDEPENDENT_AMBULATORY_CARE_PROVIDER_SITE_OTHER): Payer: Medicare HMO

## 2022-12-11 DIAGNOSIS — Z23 Encounter for immunization: Secondary | ICD-10-CM | POA: Diagnosis not present

## 2022-12-19 ENCOUNTER — Encounter: Payer: Medicare HMO | Admitting: Pharmacist

## 2022-12-31 ENCOUNTER — Other Ambulatory Visit: Payer: Self-pay | Admitting: Family Medicine

## 2022-12-31 ENCOUNTER — Other Ambulatory Visit: Payer: Self-pay | Admitting: Cardiology

## 2023-01-09 ENCOUNTER — Encounter: Payer: Self-pay | Admitting: Family Medicine

## 2023-01-09 ENCOUNTER — Ambulatory Visit (INDEPENDENT_AMBULATORY_CARE_PROVIDER_SITE_OTHER): Payer: Medicare HMO | Admitting: Family Medicine

## 2023-01-09 VITALS — BP 138/62 | HR 77 | Temp 98.2°F | Ht 66.0 in | Wt 220.2 lb

## 2023-01-09 DIAGNOSIS — R051 Acute cough: Secondary | ICD-10-CM | POA: Diagnosis not present

## 2023-01-09 DIAGNOSIS — Z01118 Encounter for examination of ears and hearing with other abnormal findings: Secondary | ICD-10-CM | POA: Diagnosis not present

## 2023-01-09 MED ORDER — AMOXICILLIN 500 MG PO CAPS: 1000.0000 mg | ORAL_CAPSULE | Freq: Two times a day (BID) | ORAL | 0 refills | Status: DC

## 2023-01-09 MED ORDER — BENZONATATE 200 MG PO CAPS: 200.0000 mg | ORAL_CAPSULE | Freq: Two times a day (BID) | ORAL | 0 refills | Status: DC | PRN

## 2023-01-09 NOTE — Progress Notes (Signed)
 Patient ID: Gregory [name], male    DOB: [birth year], 73 y.o.   MRN: [id number]  This visit was conducted in person.  BP 138/62   Pulse 77   Temp 98.2 F (36.8 C) (Oral)   Ht 5' 6 (1.676 m)   Wt 220 lb 3.2 oz (99.9 kg)   SpO2 92%   BMI 35.54 kg/m    CC:  Chief Complaint  Patient presents with   Cough   SNEEZING    On New Years he did have diarrhea and was projectile vomiting that has since pass but he still has the coughing, sneezing and some phlegm buildup. At night it seems worse because its cutting into his sleep    Subjective:   HPI: Gregory [name] is a 73 y.o. male presenting on 01/09/2023 for Cough and SNEEZING (On New Years he did have diarrhea and was projectile vomiting that has since pass but he still has the coughing, sneezing and some phlegm buildup. At night it seems worse because its cutting into his sleep)   Date of onset 6 days Initial symptoms included  sneeze, runny nose, headache. Symptoms progressed to productive cough. Also had vomiting and diarrhea 7 days ago but this is now resolved  Cough is keeping him up at night  No SOB, no wheeze.   Has noted some intermittent pain in right hear with headache.. gone now.  Has noted decreased hearing in right ear over last several months.  Sick contacts:   with family member who was sick COVID testing:   none     He has tried to treat with  Mucinex DM     No history of chronic lung disease such as asthma or COPD.  Has atrial  fibrillation Non-smoker.       Relevant past medical, surgical, family and social history reviewed and updated as indicated. Interim medical history since our last visit reviewed. Allergies and medications reviewed and updated. Outpatient Medications Prior to Visit  Medication Sig Dispense Refill   acetaminophen  (TYLENOL ) 500 MG tablet Take 1,000 mg by mouth every 6 (six) hours as needed for moderate pain.     allopurinol  (ZYLOPRIM ) 100 MG tablet Take 1 tablet by  mouth once daily 90 tablet 1   ALPRAZolam  (XANAX ) 0.5 MG tablet TAKE 1 TABLET BY MOUTH ONCE DAILY AS  NEEDED  FOR  ANXIETY  TAKE  30  MINUTES  PRIOR  TO  PLANE FLIGHT OR AS NEEDED FOR ANXIETY 20 tablet 0   apixaban  (ELIQUIS ) 5 MG TABS tablet Take 1 tablet (5 mg total) by mouth 2 (two) times daily. 60 tablet 6   atorvastatin  (LIPITOR ) 80 MG tablet Take 1 tablet by mouth once daily 90 tablet 3   colchicine  0.6 MG tablet Take 2 tablets by mouth once daily 60 tablet 2   furosemide  (LASIX ) 20 MG tablet TAKE TWO TABLETS BY MOUTH IN THE MORNING AND TAKE ONE TABLET BY MOUTH IN THE AFTERNOON 270 tablet 3   JATENZO  237 MG CAPS Take 237 mg by mouth 2 (two) times daily.     ketoconazole  (NIZORAL ) 2 % shampoo Apply 1 application topically 2 (two) times a week. (Patient taking differently: Apply 1 application  topically daily as needed for irritation.) 120 mL 1   latanoprost  (XALATAN ) 0.005 % ophthalmic solution Place 1 drop into both eyes every morning.      metoprolol  succinate (TOPROL -XL) 25 MG 24 hr tablet TAKE 1 TABLET BY MOUTH TWICE DAILY (PLEASE  SCHEDULE  APPOINTMENT  FOR  FUTURE  REFILLS) 30 tablet 0   Misc Natural Products (OSTEO BI-FLEX/5-LOXIN ADVANCED) TABS Take 2 tablets by mouth daily.     potassium chloride  SA (KLOR-CON  M20) 20 MEQ tablet Take 1 tablet by mouth once daily 90 tablet 3   sacubitril -valsartan  (ENTRESTO ) 97-103 MG TAKE 1 TABLET BY MOUTH TWICE DAILY . APPOINTMENT REQUIRED FOR FUTURE REFILLS 30 tablet 0   sertraline  (ZOLOFT ) 50 MG tablet Take 1 tablet by mouth once daily 90 tablet 3   spironolactone  (ALDACTONE ) 25 MG tablet Take 1 tablet by mouth once daily 90 tablet 2   Testosterone  1.62 % GEL Apply 2 Pump topically daily. (Patient not taking: Reported on 12/05/2022)     traMADol  (ULTRAM ) 50 MG tablet TAKE 1 TABLET BY MOUTH EVERY 8 HOURS AS NEEDED FOR PAIN (Patient taking differently: Take 50 mg by mouth daily in the afternoon. for pain) 90 tablet 0   traZODone  (DESYREL ) 50 MG tablet  TAKE 1/2 TO 1 (ONE-HALF TO ONE) TABLET BY MOUTH AT BEDTIME AS NEEDED FOR SLEEP 90 tablet 1   ondansetron  (ZOFRAN ) 4 MG tablet Take 1 tablet (4 mg total) by mouth every 8 (eight) hours as needed for nausea or vomiting. 30 tablet 0   oxyCODONE  (ROXICODONE ) 5 MG immediate release tablet Take 1 tablet (5 mg total) by mouth every 4 (four) hours as needed for severe pain. (Patient not taking: Reported on 09/24/2022) 40 tablet 0   No facility-administered medications prior to visit.     Per HPI unless specifically indicated in ROS section below Review of Systems  Constitutional:  Negative for fatigue and fever.  HENT:  Positive for congestion and ear pain.   Eyes:  Negative for pain.  Respiratory:  Positive for cough. Negative for shortness of breath.   Cardiovascular:  Negative for chest pain, palpitations and leg swelling.  Gastrointestinal:  Negative for abdominal pain.  Genitourinary:  Negative for dysuria.  Musculoskeletal:  Negative for arthralgias.  Neurological:  Negative for syncope, light-headedness and headaches.  Psychiatric/Behavioral:  Negative for dysphoric mood.    Objective:  BP 138/62   Pulse 77   Temp 98.2 F (36.8 C) (Oral)   Ht 5' 6 (1.676 m)   Wt 220 lb 3.2 oz (99.9 kg)   SpO2 92%   BMI 35.54 kg/m   Wt Readings from Last 3 Encounters:  01/09/23 220 lb 3.2 oz (99.9 kg)  12/05/22 220 lb 3.2 oz (99.9 kg)  11/01/22 214 lb 8 oz (97.3 kg)      Physical Exam Vitals reviewed.  Constitutional:      Appearance: He is well-developed.  HENT:     Head: Normocephalic.     Right Ear: Hearing normal. No middle ear effusion. Tympanic membrane is scarred and erythematous.     Left Ear: Hearing normal.  No middle ear effusion.     Ears:      Comments:  Abnormal right TM.SABRA erythema on posterior Tm with inlay of well circumscribe yellowish lesion  Possible  infeciton vs scarring vs cholesteoma    Nose: Nose normal.  Neck:     Thyroid : No thyroid  mass or thyromegaly.      Vascular: No carotid bruit.     Trachea: Trachea normal.  Cardiovascular:     Rate and Rhythm: Normal rate and regular rhythm.     Pulses: Normal pulses.     Heart sounds: Heart sounds not distant. Murmur heard.     Systolic murmur is present.  No friction rub. No gallop.     Comments: No peripheral edema Pulmonary:     Effort: Pulmonary effort is normal. No respiratory distress.     Breath sounds: Normal breath sounds.  Skin:    General: Skin is warm and dry.     Findings: No rash.  Psychiatric:        Speech: Speech normal.        Behavior: Behavior normal.        Thought Content: Thought content normal.       Results for orders placed or performed in visit on 11/01/22  CMP ([facility] only)   Collection Time: 11/01/22 12:26 PM  Result Value Ref Range   Sodium 140 135 - 145 mmol/L   Potassium 4.2 3.5 - 5.1 mmol/L   Chloride 105 98 - 111 mmol/L   CO2 32 22 - 32 mmol/L   Glucose, Bld 94 70 - 99 mg/dL   BUN 22 8 - 23 mg/dL   Creatinine 9.05 9.38 - 1.24 mg/dL   Calcium  9.1 8.9 - 10.3 mg/dL   Total Protein 6.6 6.5 - 8.1 g/dL   Albumin  4.2 3.5 - 5.0 g/dL   AST 47 (H) 15 - 41 U/L   ALT 15 0 - 44 U/L   Alkaline Phosphatase 47 38 - 126 U/L   Total Bilirubin 1.1 0.3 - 1.2 mg/dL   GFR, Estimated >39 >39 mL/min   Anion gap 3 (L) 5 - 15  CBC with Differential (Cancer Center Only)   Collection Time: 11/01/22 12:26 PM  Result Value Ref Range   WBC Count 5.4 4.0 - 10.5 K/uL   RBC 2.81 (L) 4.22 - 5.81 MIL/uL   Hemoglobin 9.5 (L) 13.0 - 17.0 g/dL   HCT 70.4 (L) 60.9 - 47.9 %   MCV 105.0 (H) 80.0 - 100.0 fL   MCH 33.8 26.0 - 34.0 pg   MCHC 32.2 30.0 - 36.0 g/dL   RDW 85.5 88.4 - 84.4 %   Platelet Count 220 150 - 400 K/uL   nRBC 0.0 0.0 - 0.2 %   Neutrophils Relative % 60 %   Neutro Abs 3.3 1.7 - 7.7 K/uL   Lymphocytes Relative 28 %   Lymphs Abs 1.5 0.7 - 4.0 K/uL   Monocytes Relative 10 %   Monocytes Absolute 0.5 0.1 - 1.0 K/uL   Eosinophils Relative 1 %    Eosinophils Absolute 0.1 0.0 - 0.5 K/uL   Basophils Relative 0 %   Basophils Absolute 0.0 0.0 - 0.1 K/uL   Immature Granulocytes 1 %   Abs Immature Granulocytes 0.03 0.00 - 0.07 K/uL    Assessment and Plan  Acute cough Assessment & Plan: Acute, symptoms most consistent with viral upper respiratory tract infection with possible right ear involvement. Will treat with symptomatic care benzonatate  200 mg twice daily as needed for cough. Will start amoxicillin  500 mg 2 capsules twice daily x 7 days for possible otitis media.   Abnormal ear exam Assessment & Plan: Acute, associated with possible new hearing loss in right ear.  Will treat with antibiotics for possible bacterial infection giving additional symptoms but patient will return for reevaluation of ear in 2 to 4 weeks. May need ENT referral for further evaluation.   Other orders -     Benzonatate ; Take 1 capsule (200 mg total) by mouth 2 (two) times daily as needed for cough.  Dispense: 20 capsule; Refill: 0 -     Amoxicillin ; Take 2 capsules (1,000  mg total) by mouth 2 (two) times daily.  Dispense: 28 capsule; Refill: 0    Return in about 3 weeks (around 01/30/2023) for  follow up right ear exam.   Greig Ring, MD

## 2023-01-09 NOTE — Assessment & Plan Note (Signed)
 Acute, associated with possible new hearing loss in right ear.  Will treat with antibiotics for possible bacterial infection giving additional symptoms but patient will return for reevaluation of ear in 2 to 4 weeks. May need ENT referral for further evaluation.

## 2023-01-09 NOTE — Assessment & Plan Note (Signed)
 Acute, symptoms most consistent with viral upper respiratory tract infection with possible right ear involvement. Will treat with symptomatic care benzonatate  200 mg twice daily as needed for cough. Will start amoxicillin  500 mg 2 capsules twice daily x 7 days for possible otitis media.

## 2023-01-14 ENCOUNTER — Other Ambulatory Visit: Payer: Self-pay | Admitting: Cardiology

## 2023-01-17 ENCOUNTER — Ambulatory Visit (INDEPENDENT_AMBULATORY_CARE_PROVIDER_SITE_OTHER): Payer: Medicare HMO

## 2023-01-17 ENCOUNTER — Other Ambulatory Visit: Payer: Self-pay | Admitting: Cardiology

## 2023-01-17 DIAGNOSIS — I420 Dilated cardiomyopathy: Secondary | ICD-10-CM

## 2023-01-17 LAB — CUP PACEART REMOTE DEVICE CHECK
Battery Remaining Longevity: 55 mo
Battery Remaining Percentage: 67 %
Battery Voltage: 2.95 V
Brady Statistic AP VP Percent: 87 %
Brady Statistic AP VS Percent: 1 %
Brady Statistic AS VP Percent: 12 %
Brady Statistic AS VS Percent: 1 %
Brady Statistic RA Percent Paced: 87 %
Date Time Interrogation Session: 20250116010038
HighPow Impedance: 57 Ohm
Implantable Lead Connection Status: 753985
Implantable Lead Connection Status: 753985
Implantable Lead Connection Status: 753985
Implantable Lead Implant Date: 20221020
Implantable Lead Implant Date: 20221020
Implantable Lead Implant Date: 20221020
Implantable Lead Location: 753858
Implantable Lead Location: 753859
Implantable Lead Location: 753860
Implantable Pulse Generator Implant Date: 20221020
Lead Channel Impedance Value: 350 Ohm
Lead Channel Impedance Value: 410 Ohm
Lead Channel Impedance Value: 660 Ohm
Lead Channel Pacing Threshold Amplitude: 0.625 V
Lead Channel Pacing Threshold Amplitude: 0.75 V
Lead Channel Pacing Threshold Amplitude: 1.625 V
Lead Channel Pacing Threshold Pulse Width: 0.5 ms
Lead Channel Pacing Threshold Pulse Width: 0.5 ms
Lead Channel Pacing Threshold Pulse Width: 0.7 ms
Lead Channel Sensing Intrinsic Amplitude: 12 mV
Lead Channel Sensing Intrinsic Amplitude: 2.5 mV
Lead Channel Setting Pacing Amplitude: 1.625
Lead Channel Setting Pacing Amplitude: 2.125
Lead Channel Setting Pacing Amplitude: 2.5 V
Lead Channel Setting Pacing Pulse Width: 0.5 ms
Lead Channel Setting Pacing Pulse Width: 0.7 ms
Lead Channel Setting Sensing Sensitivity: 0.5 mV
Pulse Gen Serial Number: 111052020
Zone Setting Status: 755011

## 2023-01-20 ENCOUNTER — Encounter: Payer: Self-pay | Admitting: Cardiology

## 2023-01-23 ENCOUNTER — Encounter: Payer: Self-pay | Admitting: Family Medicine

## 2023-01-24 ENCOUNTER — Encounter: Payer: Self-pay | Admitting: Family Medicine

## 2023-01-24 ENCOUNTER — Ambulatory Visit (INDEPENDENT_AMBULATORY_CARE_PROVIDER_SITE_OTHER): Payer: Medicare HMO | Admitting: Family Medicine

## 2023-01-24 VITALS — BP 128/64 | HR 75 | Temp 97.8°F | Ht 66.0 in | Wt 221.5 lb

## 2023-01-24 DIAGNOSIS — W19XXXA Unspecified fall, initial encounter: Secondary | ICD-10-CM | POA: Diagnosis not present

## 2023-01-24 DIAGNOSIS — Z01118 Encounter for examination of ears and hearing with other abnormal findings: Secondary | ICD-10-CM | POA: Diagnosis not present

## 2023-01-24 DIAGNOSIS — S41101A Unspecified open wound of right upper arm, initial encounter: Secondary | ICD-10-CM

## 2023-01-24 MED ORDER — SILVER SULFADIAZINE 1 % EX CREA: 1.0000 | TOPICAL_CREAM | Freq: Every day | CUTANEOUS | 0 refills | Status: DC

## 2023-01-24 NOTE — Assessment & Plan Note (Signed)
Abnormal exam from early January has resolved with oral antibiotics.  Patient continues to have symptoms of eustachian tube dysfunction.  He will start Flonase 2 sprays per nostril daily.  If minimal improvement in the next 2 weeks consider prednisone taper or referral to ENT

## 2023-01-24 NOTE — Assessment & Plan Note (Signed)
Acute, no preceding symptoms.  Patient feels well overall.  Fall occurred because cane slipped.

## 2023-01-24 NOTE — Progress Notes (Signed)
Patient ID: Gregory [name], male    DOB: 04-21-1950, 73 y.o.   MRN: [id number]  This visit was conducted in person.  BP 128/64   Pulse 75   Temp 97.8 F (36.6 C) (Oral)   Ht 5\' 6"  (1.676 m)   Wt 221 lb 8 oz (100.5 kg)   SpO2 95%   BMI 35.75 kg/m    CC:  Chief Complaint  Patient presents with   Wound Check    Here to have R arm wound checked.     Subjective:   HPI: Gregory [name] is a 73 y.o. male patient with history of prediabetes, atrial fibrillation, coronary artery disease, dilated cardiomyopathy and peripheral vascular disease presenting on 01/24/2023 for Wound Check (Here to have R arm wound checked. )   He reports accidental fall on January 22, 2023.  Landed on chest Had resulting abrasion on right arm and hand. [name] kicked out from him. No proceeding dizziness, weakness, palpitations . Has some chest soreness, right arm hit door jaw.. peeled skin off right arm.  Stopped bleeding, washed with soapy water. Wrapped.  No elbow or wrist pain.  Small abrasion on knees.  He has history of poorly healing injuries on leg secondary to peripheral artery disease.     On eliquis.    Wife has noted decreased hearing .[name] Worse  in right ear compared to left.  S/P amox course... still decreased hearing and feels like underwater.  No  pain, no fever.   Relevant past medical, surgical, family and social history reviewed and updated as indicated. Interim medical history since our last visit reviewed. Allergies and medications reviewed and updated. Outpatient Medications Prior to Visit  Medication Sig Dispense Refill   acetaminophen (TYLENOL) 500 MG tablet Take 1,000 mg by mouth every 6 (six) hours as needed for moderate pain.     allopurinol (ZYLOPRIM) 100 MG tablet Take 1 tablet by mouth once daily 90 tablet 1   ALPRAZolam (XANAX) 0.5 MG tablet TAKE 1 TABLET BY MOUTH ONCE DAILY AS  NEEDED  FOR  ANXIETY  TAKE  30  MINUTES  PRIOR  TO  PLANE FLIGHT OR AS NEEDED  FOR ANXIETY 20 tablet 0   amoxicillin (AMOXIL) 500 MG capsule Take 2 capsules (1,000 mg total) by mouth 2 (two) times daily. 28 capsule 0   apixaban (ELIQUIS) 5 MG TABS tablet Take 1 tablet (5 mg total) by mouth 2 (two) times daily. 60 tablet 6   atorvastatin (LIPITOR) 80 MG tablet Take 1 tablet by mouth once daily 90 tablet 3   benzonatate (TESSALON) 200 MG capsule Take 1 capsule (200 mg total) by mouth 2 (two) times daily as needed for cough. 20 capsule 0   colchicine 0.6 MG tablet Take 2 tablets by mouth once daily 60 tablet 2   furosemide (LASIX) 20 MG tablet TAKE TWO TABLETS BY MOUTH IN THE MORNING AND TAKE ONE TABLET BY MOUTH IN THE AFTERNOON 270 tablet 3   JATENZO 237 MG CAPS Take 237 mg by mouth 2 (two) times daily.     ketoconazole (NIZORAL) 2 % shampoo Apply 1 application topically 2 (two) times a week. (Patient taking differently: Apply 1 application  topically daily as needed for irritation.) 120 mL 1   latanoprost (XALATAN) 0.005 % ophthalmic solution Place 1 drop into both eyes every morning.      metoprolol succinate (TOPROL-XL) 25 MG 24 hr tablet TAKE 1 TABLET BY MOUTH TWICE DAILY (PLEASE  SCHEDULE  APPOINTMENT  FOR  FUTURE  REFILLS) 30 tablet 0   Misc Natural Products (OSTEO BI-FLEX/5-LOXIN ADVANCED) TABS Take 2 tablets by mouth daily.     potassium chloride SA (KLOR-CON M20) 20 MEQ tablet Take 1 tablet by mouth once daily 30 tablet 0   sacubitril-valsartan (ENTRESTO) 97-103 MG TAKE 1 TABLET BY MOUTH TWICE DAILY. APPOINTMENT NEEDED FOR FURTHER REFILLS 15 tablet 0   sertraline (ZOLOFT) 50 MG tablet Take 1 tablet by mouth once daily 90 tablet 3   spironolactone (ALDACTONE) 25 MG tablet Take 1 tablet by mouth once daily 90 tablet 0   Testosterone 1.62 % GEL Apply 2 Pump topically daily.     traMADol (ULTRAM) 50 MG tablet TAKE 1 TABLET BY MOUTH EVERY 8 HOURS AS NEEDED FOR PAIN (Patient taking differently: Take 50 mg by mouth daily in the afternoon. for pain) 90 tablet 0   traZODone  (DESYREL) 50 MG tablet TAKE 1/2 TO 1 (ONE-HALF TO ONE) TABLET BY MOUTH AT BEDTIME AS NEEDED FOR SLEEP 90 tablet 1   No facility-administered medications prior to visit.     Per HPI unless specifically indicated in ROS section below Review of Systems  Constitutional:  Negative for fatigue and fever.  HENT:  Negative for ear pain.   Eyes:  Negative for pain.  Respiratory:  Negative for cough and shortness of breath.   Cardiovascular:  Negative for chest pain, palpitations and leg swelling.  Gastrointestinal:  Negative for abdominal pain.  Genitourinary:  Negative for dysuria.  Musculoskeletal:  Negative for arthralgias.  Neurological:  Negative for syncope, light-headedness and headaches.  Psychiatric/Behavioral:  Negative for dysphoric mood.    Objective:  BP 128/64   Pulse 75   Temp 97.8 F (36.6 C) (Oral)   Ht 5\' 6"  (1.676 m)   Wt 221 lb 8 oz (100.5 kg)   SpO2 95%   BMI 35.75 kg/m   Wt Readings from Last 3 Encounters:  01/24/23 221 lb 8 oz (100.5 kg)  01/09/23 220 lb 3.2 oz (99.9 kg)  12/05/22 220 lb 3.2 oz (99.9 kg)      Physical Exam Constitutional:      Appearance: He is well-developed.  HENT:     Head: Normocephalic.     Right Ear: Hearing, tympanic membrane, ear canal and external ear normal.     Left Ear: Hearing, tympanic membrane, ear canal and external ear normal.     Nose: Nose normal.  Neck:     Thyroid: No thyroid mass or thyromegaly.     Vascular: No carotid bruit.     Trachea: Trachea normal.  Cardiovascular:     Rate and Rhythm: Normal rate and regular rhythm.     Pulses: Normal pulses.     Heart sounds: Heart sounds not distant. No murmur heard.    No friction rub. No gallop.     Comments: No peripheral edema Pulmonary:     Effort: Pulmonary effort is normal. No respiratory distress.     Breath sounds: Normal breath sounds.  Skin:    General: Skin is warm and dry.     Findings: No rash.          Comments: 5 cm abrasion with skin flap in  right forearm, 3 cm abrasion on right posterior hand, small abrasion on  bilateral knees  Psychiatric:        Speech: Speech normal.        Behavior: Behavior normal.        Thought Content:  Thought content normal.       Results for orders placed or performed in visit on 01/17/23  CUP PACEART REMOTE DEVICE CHECK   Collection Time: 01/17/23  1:00 AM  Result Value Ref Range   Date Time Interrogation Session [id number]    Pulse Generator Manufacturer SJCR    Pulse Gen Model [id number] Gallant HF    Pulse Gen Serial Number [id number]    Clinic Name [facility]    Implantable Pulse Generator Type Cardiac Resynch Therapy Defibulator    Implantable Pulse Generator Implant Date [id number]    Implantable Lead Manufacturer [facility]    Implantable Lead Model 1458Q Quartet    Implantable Lead Serial Number [id number]    Implantable Lead Implant Date [id number]    Implantable Lead Location Detail 1 UNKNOWN    Implantable Lead Location [id number]    Implantable Lead Connection Status [id number]    Implantable Lead Manufacturer [facility]    Implantable Lead Model 770-541-6399 Durata SJ4    Implantable Lead Serial Number [id number]    Implantable Lead Implant Date [id number]    Implantable Lead Location Detail 1 UNKNOWN    Implantable Lead Location [id number]    Implantable Lead Connection Status [id number]    Implantable Lead Manufacturer SJCR    Implantable Lead Model LPA1200M Tendril MRI    Implantable Lead Serial Number [id number]    Implantable Lead Implant Date [id number]    Implantable Lead Location Detail 1 UNKNOWN    Implantable Lead Location [id number]    Implantable Lead Connection Status [id number]    Lead Channel Setting Sensing Sensitivity 0.5 mV   Lead Channel Setting Sensing Adaptation Mode Adaptive Sensing    Lead Channel Setting Pacing Amplitude 2.5 V   Lead Channel Setting Pacing Pulse Width 0.5 ms   Lead Channel Setting Pacing Amplitude 1.625    Lead Channel Setting Pacing Pulse Width 0.7 ms   Lead Channel  Setting Pacing Amplitude 2.125    Lead Channel Setting Pacing Capture Mode Adaptive Capture    Zone Setting Status Active    Zone Setting Status Inactive    Zone Setting Status [phone]    Lead Channel Status NULL    Lead Channel Impedance Value 660 ohm   Lead Channel Pacing Threshold Amplitude 1.625 V   Lead Channel Pacing Threshold Pulse Width 0.7 ms   Lead Channel Status NULL    Lead Channel Impedance Value 410 ohm   Lead Channel Sensing Intrinsic Amplitude 2.5 mV   Lead Channel Pacing Threshold Amplitude 0.75 V   Lead Channel Pacing Threshold Pulse Width 0.5 ms   Lead Channel Status NULL    Lead Channel Impedance Value 350 ohm   Lead Channel Sensing Intrinsic Amplitude 12.0 mV   Lead Channel Pacing Threshold Amplitude 0.625 V   Lead Channel Pacing Threshold Pulse Width 0.5 ms   HighPow Impedance 57 ohm   HighPow Imped Status NULL    Battery Status MOS    Battery Remaining Longevity 55 mo   Battery Remaining Percentage 67.0 %   Battery Voltage 2.95 V   Brady Statistic RA Percent Paced 87.0 %   Brady Statistic AP VP Percent 87.0 %   Brady Statistic AS VP Percent 12.0 %   Brady Statistic AP VS Percent 1.0 %   Brady Statistic AS VS Percent 1.0 %    Assessment and Plan  Arm wound, right, initial encounter Assessment & Plan: Acute, still some minor oozing able to achieve Heema stasis.  Dead tissue debrided from both wounds  on right arm. Applied petroleum gauze and absorbable gauze with Coban. Recommend washing daily with warm soapy water.  Apply Silvadene cream for antibacterial. Patient will call if signs of infection occurring.  Return and ER precautions provided.   Accidental fall, initial encounter Assessment & Plan: Acute, no preceding symptoms.  Patient feels well overall.  Fall occurred because cane slipped.   Abnormal ear exam Assessment & Plan: Abnormal exam from early January has resolved with oral antibiotics.  Patient continues to have symptoms of eustachian  tube dysfunction.  He will start Flonase 2 sprays per nostril daily.  If minimal improvement in the next 2 weeks consider prednisone taper or referral to ENT   Other orders -     Silver sulfADIAZINE; Apply 1 Application topically daily.  Dispense: 400 g; Refill: 0    No follow-ups on file.   [name], MD

## 2023-01-24 NOTE — Assessment & Plan Note (Signed)
Acute, still some minor oozing able to achieve Heema stasis.  Dead tissue debrided from both wounds on right arm. Applied petroleum gauze and absorbable gauze with Coban. Recommend washing daily with warm soapy water.  Apply Silvadene cream for antibacterial. Patient will call if signs of infection occurring.  Return and ER precautions provided.

## 2023-01-30 ENCOUNTER — Ambulatory Visit: Payer: Medicare HMO | Admitting: Family Medicine

## 2023-02-05 ENCOUNTER — Other Ambulatory Visit: Payer: Self-pay | Admitting: Family Medicine

## 2023-02-08 ENCOUNTER — Other Ambulatory Visit: Payer: Self-pay | Admitting: Family Medicine

## 2023-02-08 NOTE — Telephone Encounter (Signed)
 Last office visit 01/24/2023 for arm wound.  Last refilled 12/04/22 for #90 with no refills.  Next appt: No future appointments with PCP.

## 2023-02-21 ENCOUNTER — Other Ambulatory Visit: Payer: Self-pay | Admitting: Cardiology

## 2023-02-25 NOTE — Progress Notes (Signed)
 Remote ICD transmission.

## 2023-03-06 ENCOUNTER — Telehealth: Payer: Self-pay | Admitting: Cardiology

## 2023-03-06 DIAGNOSIS — Z952 Presence of prosthetic heart valve: Secondary | ICD-10-CM

## 2023-03-06 DIAGNOSIS — I5022 Chronic systolic (congestive) heart failure: Secondary | ICD-10-CM

## 2023-03-06 DIAGNOSIS — I2510 Atherosclerotic heart disease of native coronary artery without angina pectoris: Secondary | ICD-10-CM

## 2023-03-06 DIAGNOSIS — E785 Hyperlipidemia, unspecified: Secondary | ICD-10-CM

## 2023-03-06 DIAGNOSIS — I10 Essential (primary) hypertension: Secondary | ICD-10-CM

## 2023-03-06 MED ORDER — METOPROLOL SUCCINATE ER 25 MG PO TB24: 25.0000 mg | ORAL_TABLET | Freq: Two times a day (BID) | ORAL | 0 refills | Status: DC

## 2023-03-06 MED ORDER — POTASSIUM CHLORIDE CRYS ER 20 MEQ PO TBCR: 20.0000 meq | EXTENDED_RELEASE_TABLET | Freq: Every day | ORAL | 0 refills | Status: DC

## 2023-03-06 NOTE — Telephone Encounter (Signed)
 Pt's medications were sent to pt's pharmacy as requested. Confirmation received.

## 2023-03-06 NOTE — Telephone Encounter (Signed)
*  STAT* If patient is at the pharmacy, call can be transferred to refill team.   1. Which medications need to be refilled? (please list name of each medication and dose if known) metoprolol succinate (TOPROL-XL) 25 MG 24 hr tablet  potassium chloride SA (KLOR-CON M20) 20 MEQ tablet   2. Which pharmacy/location (including street and city if local pharmacy) is medication to be sent to?  [facility] [address], [place] - [address]     3. Do they need a 30 day or 90 day supply? 90

## 2023-03-08 ENCOUNTER — Encounter (HOSPITAL_BASED_OUTPATIENT_CLINIC_OR_DEPARTMENT_OTHER): Payer: Self-pay

## 2023-03-10 NOTE — Progress Notes (Unsigned)
 Cardiology Clinic Note   Patient Name: Lazarus Sudbury Date of Encounter: 03/10/2023  Primary Care Provider:  Excell Seltzer, MD Primary Cardiologist:  Rollene Rotunda, MD  Patient Profile    ***  Past Medical History    Past Medical History:  Diagnosis Date   Alcohol abuse    Allergy    Heparin   Anemia    Anxiety    Aortic valve disease    Arthritis    CHF (congestive heart failure) (HCC)    Complication of anesthesia    slow to wake up and disoriented after [facility]- [facility] at [facility]   Coronary artery disease    Depression    Family history of adverse reaction to anesthesia    Glaucoma    Heart murmur    Hypertension    Myocardial infarction [facility])    Peripheral vascular disease (HCC)    Pre-diabetes    Presence of permanent cardiac pacemaker    S/P balloon aortic valvuloplasty 10/18/2020   done for bioprosthetic aortic valve replacement perivalvular leaking reducing it from severe to mild.   Shingles    Sleep apnea    Past Surgical History:  Procedure Laterality Date   AORTIC ARCH ANGIOGRAPHY N/A 10/10/2020   Procedure: AORTIC ARCH ANGIOGRAPHY;  Surgeon: Yvonne Kendall, MD;  Location: [facility] INVASIVE CV LAB;  Service: Cardiovascular;  Laterality: N/A;   AORTIC VALVE REPLACEMENT N/A 03/06/2019   Procedure: AORTIC VALVE REPLACEMENT (AVR), USING INTUITY ;  Surgeon: Linden Dolin, MD;  Location: [facility] OR;  Service: Open Heart Surgery;  Laterality: N/A;   BALLOON AORTIC VALVE VALVULOPLASTY N/A 10/18/2020   Procedure: BALLOON AORTIC VALVE VALVULOPLASTY;  Surgeon: Tonny Bollman, MD;  Location: [facility] OR;  Service: Cardiovascular;  Laterality: N/A;   BIV PACEMAKER INSERTION CRT-P N/A 10/20/2020   Procedure: BIV PACEMAKER INSERTION CRT-P;  Surgeon: Lanier Prude, MD;  Location: [facility] INVASIVE CV LAB;  Service: Cardiovascular;  Laterality: N/A;   CARDIAC CATHETERIZATION     CARDIAC VALVE REPLACEMENT     Aortic valve replaced   CATARACT EXTRACTION     CORONARY  ARTERY BYPASS GRAFT N/A 03/06/2019   Procedure: CORONARY ARTERY BYPASS GRAFTING (CABG), ON PUMP, TIMES THREE, USING BILATERAL INTERNAL MAMMARIES AND LEFT RADIAL ARTERY HARVEST;  Surgeon: Linden Dolin, MD;  Location: [facility] OR;  Service: Open Heart Surgery;  Laterality: N/A;  BILATERAL IMA   EYE SURGERY     Cataracts removed   KNEE ARTHROPLASTY Left 09/06/2022   Procedure: TOTAL KNEE ARTHROPLASTY;  Surgeon: Samson Frederic, MD;  Location: [facility] ORS;  Service: Orthopedics;  Laterality: Left;   LEFT HEART CATH AND CORONARY ANGIOGRAPHY N/A 03/04/2019   Procedure: LEFT HEART CATH AND CORONARY ANGIOGRAPHY;  Surgeon: Kathleene Hazel, MD;  Location: [facility] INVASIVE CV LAB;  Service: Cardiovascular;  Laterality: N/A;   None     RADIAL ARTERY HARVEST Left 03/06/2019   Procedure: RADIAL ARTERY HARVEST;  Surgeon: Linden Dolin, MD;  Location: [facility] OR;  Service: Open Heart Surgery;  Laterality: Left;   RIGHT HEART CATH AND CORONARY/GRAFT ANGIOGRAPHY N/A 10/10/2020   Procedure: RIGHT HEART CATH AND CORONARY/GRAFT ANGIOGRAPHY;  Surgeon: Yvonne Kendall, MD;  Location: [facility] INVASIVE CV LAB;  Service: Cardiovascular;  Laterality: N/A;   TEE WITHOUT CARDIOVERSION N/A 03/06/2019   Procedure: TRANSESOPHAGEAL ECHOCARDIOGRAM (TEE);  Surgeon: Linden Dolin, MD;  Location: [facility] OR;  Service: Open Heart Surgery;  Laterality: N/A;   TEE WITHOUT CARDIOVERSION N/A 10/10/2020   Procedure: TRANSESOPHAGEAL ECHOCARDIOGRAM (TEE);  Surgeon: Wendall Stade, MD;  Location: [facility] ENDOSCOPY;  Service: Cardiovascular;  Laterality: N/A;   TEE WITHOUT CARDIOVERSION N/A 10/18/2020   Procedure: TRANSESOPHAGEAL ECHOCARDIOGRAM (TEE);  Surgeon: Tonny Bollman, MD;  Location: [facility] OR;  Service: Open Heart Surgery;  Laterality: N/A;   TEMPORARY PACEMAKER N/A 10/17/2020   Procedure: TEMPORARY PACEMAKER;  Surgeon: Marinus Maw, MD;  Location: [facility] 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 Authorizing Provider  acetaminophen (TYLENOL) 500 MG tablet Take 1,000 mg by mouth every 6 (six) hours as needed for moderate pain.    [provider]  allopurinol (ZYLOPRIM) 100 MG tablet Take 1 tablet by mouth once daily 02/05/23   Bedsole, Amy E, MD  ALPRAZolam (XANAX) 0.5 MG tablet TAKE 1 TABLET BY MOUTH ONCE DAILY AS  NEEDED  FOR  ANXIETY  TAKE  30  MINUTES  PRIOR  TO  PLANE FLIGHT OR AS NEEDED FOR ANXIETY 11/01/21   Bedsole, Amy E, MD  amoxicillin (AMOXIL) 500 MG capsule Take 2 capsules (1,000 mg total) by mouth 2 (two) times daily. 01/09/23   Bedsole, Amy E, MD  apixaban (ELIQUIS) 5 MG TABS tablet Take 1 tablet (5 mg total) by mouth 2 (two) times daily. 08/08/22   Eustace Pen, PA-C  atorvastatin (LIPITOR) 80 MG tablet Take 1 tablet by mouth once daily 07/06/22   Bedsole, Amy E, MD  benzonatate (TESSALON) 200 MG capsule Take 1 capsule (200 mg total) by mouth 2 (two) times daily as needed for cough. 01/09/23   Excell Seltzer, MD  colchicine 0.6 MG tablet Take 2 tablets by mouth once daily 12/31/22   Bedsole, Amy E, MD  furosemide (LASIX) 20 MG tablet TAKE TWO TABLETS BY MOUTH IN THE MORNING AND TAKE ONE TABLET BY MOUTH IN THE AFTERNOON 01/31/22   Rollene Rotunda, MD  JATENZO 237 MG CAPS Take 237 mg by mouth 2 (two) times daily. 02/09/22   [provider]  ketoconazole (NIZORAL) 2 % shampoo Apply 1 application topically 2 (two) times a week. Patient taking differently: Apply 1 application  topically daily as needed for irritation. 05/09/20   Bedsole, Amy E, MD  latanoprost (XALATAN) 0.005 % ophthalmic solution Place 1 drop into both eyes every morning.  10/25/18   [provider]  metoprolol succinate (TOPROL-XL) 25 MG 24 hr tablet Take 1 tablet (25 mg total) by mouth 2 (two)  times daily. 03/06/23   Rollene Rotunda, MD  Misc Natural Products (OSTEO BI-FLEX/5-LOXIN ADVANCED) TABS Take 2 tablets by mouth daily.    [provider]  potassium chloride SA (KLOR-CON M20) 20 MEQ tablet Take 1 tablet (20 mEq total) by mouth daily. 03/06/23   Rollene Rotunda, MD  sacubitril-valsartan (ENTRESTO) 97-103 MG Take 1 tablet by mouth 2 (two) times daily. 02/22/23   Rollene Rotunda, MD  sertraline (ZOLOFT) 50 MG tablet Take 1 tablet by mouth once daily 05/08/22   Bedsole, Amy E, MD  silver sulfADIAZINE (SILVADENE) 1 % cream Apply 1 Application topically daily. 01/24/23   Excell Seltzer, MD  spironolactone (ALDACTONE) 25 MG tablet Take 1 tablet by mouth once daily 01/16/23   Rollene Rotunda, MD  Testosterone 1.62 % GEL Apply  2 Pump topically daily. 06/11/22   [provider]  traMADol (ULTRAM) 50 MG tablet TAKE 1 TABLET BY MOUTH EVERY 8 HOURS AS NEEDED FOR PAIN 02/08/23   Bedsole, Amy E, MD  traZODone (DESYREL) 50 MG tablet TAKE 1/2 TO 1 (ONE-HALF TO ONE) TABLET BY MOUTH AT BEDTIME AS NEEDED FOR SLEEP 09/04/22   Excell Seltzer, MD    Family History    Family History  Adopted: Yes  Problem Relation Age of Onset   Cancer Mother        breast cancer   Alcohol abuse Father    Breast cancer Sister    Cancer Sister    is adopted.   Social History    Social History   Socioeconomic History   Marital status: Married    Spouse name: Cindi   Number of children: 3   Years of education: Not on file   Highest education level: Some college, no degree  Occupational History   Occupation: Holiday representative  Tobacco Use   Smoking status: Never   Smokeless tobacco: Never  Vaping Use   Vaping status: Never Used  Substance and Sexual Activity   Alcohol use: Yes    Alcohol/week: 7.0 standard drinks of alcohol    Types: 7 Cans of beer per week   Drug use: Yes    Frequency: 1.0 times per week    Types: Marijuana    Comment: gummies   Sexual activity: Not Currently    Birth  control/protection: Abstinence  Other Topics Concern   Not on file  Social History Narrative   Lives at home with wife.   Social Drivers of Corporate investment banker Strain: Low Risk  (01/08/2023)   Overall Financial Resource Strain (CARDIA)    Difficulty of Paying Living Expenses: Not very hard  Food Insecurity: No Food Insecurity (01/08/2023)   Hunger Vital Sign    Worried About Running Out of Food in the Last Year: Never true    Ran Out of Food in the Last Year: Never true  Transportation Needs: No Transportation Needs (01/08/2023)   PRAPARE - Administrator, Civil Service (Medical): No    Lack of Transportation (Non-Medical): No  Physical Activity: Unknown (01/08/2023)   Exercise Vital Sign    Days of Exercise per Week: 0 days    Minutes of Exercise per Session: Not on file  Stress: Stress Concern Present (01/08/2023)   [employer] of Occupational Health - Occupational Stress Questionnaire    Feeling of Stress : Very much  Social Connections: Unknown (01/08/2023)   Social Connection and Isolation Panel [NHANES]    Frequency of Communication with Friends and Family: Patient declined    Frequency of Social Gatherings with Friends and Family: Patient declined    Attends Religious Services: Never    Database administrator or Organizations: No    Attends Engineer, structural: Not on file    Marital Status: Married  Catering manager Violence: Not At Risk (09/06/2022)   Humiliation, Afraid, Rape, and Kick questionnaire    Fear of Current or Ex-Partner: No    Emotionally Abused: No    Physically Abused: No    Sexually Abused: No     Review of Systems    General:  No chills, fever, night sweats or weight changes.  Cardiovascular:  No chest pain, dyspnea on exertion, edema, orthopnea, palpitations, paroxysmal nocturnal dyspnea. Dermatological: No rash, lesions/masses Respiratory: No cough, dyspnea Urologic: No hematuria, dysuria Abdominal:  No nausea,  vomiting, diarrhea, bright red blood per rectum, melena, or hematemesis Neurologic:  No visual changes, wkns, changes in mental status. All other systems reviewed and are otherwise negative except as noted above.  Physical Exam    VS:  There were no vitals taken for this visit. , BMI There is no height or weight on file to calculate BMI. GEN: Well nourished, well developed, in no acute distress. HEENT: normal. Neck: Supple, no JVD, carotid bruits, or masses. Cardiac: RRR, no murmurs, rubs, or gallops. No clubbing, cyanosis, edema.  Radials/DP/PT 2+ and equal bilaterally.  Respiratory:  Respirations regular and unlabored, clear to auscultation bilaterally. GI: Soft, nontender, nondistended, BS + x 4. MS: no deformity or atrophy. Skin: warm and dry, no rash. Neuro:  Strength and sensation are intact. Psych: Normal affect.  Accessory Clinical Findings    Recent Labs: 11/01/2022: ALT 15; BUN 22; Creatinine 0.94; Hemoglobin 9.5; Platelet Count 220; Potassium 4.2; Sodium 140   Recent Lipid Panel    Component Value Date/Time   CHOL 110 04/25/2022 0815   CHOL 132 08/07/2019 0811   TRIG 82.0 04/25/2022 0815   TRIG 162 (H) 10/25/2005 1507   HDL 50.00 04/25/2022 0815   HDL 61 08/07/2019 0811   CHOLHDL 2 04/25/2022 0815   VLDL 16.4 04/25/2022 0815   LDLCALC 44 04/25/2022 0815   LDLCALC 52 08/07/2019 0811   LDLDIRECT 151.0 01/05/2016 0748    No BP recorded.  {Refresh Note OR Click here to enter BP  :1}***    ECG personally reviewed by me today- ***          Assessment & Plan   1.  ***   Thomasene Ripple. Pakou Rainbow NP-C     03/10/2023, 3:53 PM [facility] [address] Office [phone] Fax [phone]    I spent***minutes examining this patient, reviewing medications, and using patient centered shared decision making involving their cardiac care.   I spent  20 minutes reviewing past medical history,  medications, and prior cardiac  tests.

## 2023-03-12 ENCOUNTER — Ambulatory Visit: Attending: General Practice | Admitting: General Practice

## 2023-03-12 ENCOUNTER — Encounter: Payer: Self-pay | Admitting: General Practice

## 2023-03-12 VITALS — BP 106/64 | HR 62 | Ht 67.0 in | Wt 216.2 lb

## 2023-03-12 DIAGNOSIS — Z9889 Other specified postprocedural states: Secondary | ICD-10-CM

## 2023-03-12 DIAGNOSIS — I480 Paroxysmal atrial fibrillation: Secondary | ICD-10-CM | POA: Diagnosis not present

## 2023-03-12 DIAGNOSIS — I2510 Atherosclerotic heart disease of native coronary artery without angina pectoris: Secondary | ICD-10-CM | POA: Diagnosis not present

## 2023-03-12 DIAGNOSIS — E785 Hyperlipidemia, unspecified: Secondary | ICD-10-CM

## 2023-03-12 NOTE — Patient Instructions (Signed)
 Medication Instructions:  SEE ATTACHED LETTER The current medical regimen is effective;  continue present plan and medications as directed. Please refer to the Current Medication list given to you today.  *If you need a refill on your cardiac medications before your next appointment, please call your pharmacy*  Lab Work: FASTING LIPID AND LFT If you have labs (blood work) drawn today and your tests are completely normal, you will receive your results only by:  MyChart Message (if you have MyChart) OR  A paper copy in the mail If you have any lab test that is abnormal or we need to change your treatment, we will call you to review the results.  Testing/Procedures: Your physician has requested that you have an echocardiogram. Echocardiography is a painless test that uses sound waves to create images of your heart. It provides your doctor with information about the size and shape of your heart and how well your heart's chambers and valves are working. This procedure takes approximately one hour. There are no restrictions for this procedure. Please do NOT wear cologne, perfume, aftershave, or lotions (deodorant is allowed). Please arrive 15 minutes prior to your appointment time.  Please note: We ask at that you not bring children with you during ultrasound (echo/ vascular) testing. Due to room size and safety concerns, children are not allowed in the ultrasound rooms during exams. Our front office staff cannot provide observation of children in our lobby area while testing is being conducted. An adult accompanying a patient to their appointment will only be allowed in the ultrasound room at the discretion of the ultrasound technician under special circumstances. We apologize for any inconvenience.    Follow-Up: At [facility], you and your health needs are our priority.  As part of our continuing mission to provide you with exceptional heart care, we have created designated Provider Care  Teams.  These Care Teams include your primary Cardiologist (physician) and Advanced Practice Providers (APPs -  Physician Assistants and Nurse Practitioners) who all work together to provide you with the care you need, when you need it.  We recommend signing up for the patient portal called "MyChart".  Sign up information is provided on this After Visit Summary.  MyChart is used to connect with patients for Virtual Visits (Telemedicine).  Patients are able to view lab/test results, encounter notes, upcoming appointments, etc.  Non-urgent messages can be sent to your provider as well.   To learn more about what you can do with MyChart, go to [email or web address].    Your next appointment:   12 month(s)  Provider:   Rollene Rotunda, MD         s

## 2023-03-25 ENCOUNTER — Other Ambulatory Visit: Payer: Self-pay | Admitting: Cardiology

## 2023-03-28 ENCOUNTER — Telehealth: Payer: Self-pay | Admitting: *Deleted

## 2023-03-28 DIAGNOSIS — Z23 Encounter for immunization: Secondary | ICD-10-CM

## 2023-03-28 NOTE — Telephone Encounter (Signed)
-----   Message from Alvina Chou sent at 03/28/2023 10:21 AM EDT ----- Regarding: Lab orders for Friday, 3.28.25 Lab orders for titer tests, thanks

## 2023-03-29 ENCOUNTER — Other Ambulatory Visit (INDEPENDENT_AMBULATORY_CARE_PROVIDER_SITE_OTHER)

## 2023-03-29 ENCOUNTER — Ambulatory Visit

## 2023-03-29 DIAGNOSIS — Z0184 Encounter for antibody response examination: Secondary | ICD-10-CM | POA: Diagnosis not present

## 2023-03-29 DIAGNOSIS — Z23 Encounter for immunization: Secondary | ICD-10-CM | POA: Diagnosis not present

## 2023-03-30 LAB — MEASLES/MUMPS/RUBELLA IMMUNITY
Mumps IgG: 85.1 [AU]/ml
Rubella: 24.7 {index}
Rubeola IgG: >300 [AU]/ml

## 2023-04-02 ENCOUNTER — Encounter: Payer: Self-pay | Admitting: Family Medicine

## 2023-04-03 ENCOUNTER — Telehealth (HOSPITAL_COMMUNITY): Payer: Self-pay | Admitting: General Practice

## 2023-04-03 NOTE — Telephone Encounter (Signed)
 Patient cancelled echocardiogram  and did not wish to reschedule at this time. Order will be removed from the echo WQ. Thank you.

## 2023-04-04 ENCOUNTER — Ambulatory Visit (HOSPITAL_COMMUNITY)

## 2023-04-05 NOTE — Telephone Encounter (Signed)
 Wife Arline Asp) called to request order be re-instated as patient wants to reschedule the echocardiogram.

## 2023-04-18 ENCOUNTER — Ambulatory Visit (INDEPENDENT_AMBULATORY_CARE_PROVIDER_SITE_OTHER): Payer: Medicare HMO

## 2023-04-18 ENCOUNTER — Other Ambulatory Visit: Payer: Self-pay | Admitting: Family Medicine

## 2023-04-18 DIAGNOSIS — I420 Dilated cardiomyopathy: Secondary | ICD-10-CM

## 2023-04-18 NOTE — Telephone Encounter (Signed)
 Last filled 02-08-23 #90 Last OV 01-24-23 No Future OV [facility] [address]

## 2023-04-19 ENCOUNTER — Other Ambulatory Visit: Payer: Self-pay | Admitting: Cardiology

## 2023-04-20 LAB — CUP PACEART REMOTE DEVICE CHECK
Battery Remaining Longevity: 52 mo
Battery Remaining Percentage: 63 %
Battery Voltage: 2.95 V
Brady Statistic AP VP Percent: 87 %
Brady Statistic AP VS Percent: 1 %
Brady Statistic AS VP Percent: 13 %
Brady Statistic AS VS Percent: 1 %
Brady Statistic RA Percent Paced: 87 %
Date Time Interrogation Session: 20250418142857
HighPow Impedance: 57 Ohm
Implantable Lead Connection Status: 753985
Implantable Lead Connection Status: 753985
Implantable Lead Connection Status: 753985
Implantable Lead Implant Date: 20221020
Implantable Lead Implant Date: 20221020
Implantable Lead Implant Date: 20221020
Implantable Lead Location: 753858
Implantable Lead Location: 753859
Implantable Lead Location: 753860
Implantable Pulse Generator Implant Date: 20221020
Lead Channel Impedance Value: 350 Ohm
Lead Channel Impedance Value: 440 Ohm
Lead Channel Impedance Value: 710 Ohm
Lead Channel Pacing Threshold Amplitude: 0.625 V
Lead Channel Pacing Threshold Amplitude: 0.75 V
Lead Channel Pacing Threshold Amplitude: 1.875 V
Lead Channel Pacing Threshold Pulse Width: 0.5 ms
Lead Channel Pacing Threshold Pulse Width: 0.5 ms
Lead Channel Pacing Threshold Pulse Width: 0.7 ms
Lead Channel Sensing Intrinsic Amplitude: 1.3 mV
Lead Channel Sensing Intrinsic Amplitude: 7.6 mV
Lead Channel Setting Pacing Amplitude: 1.625
Lead Channel Setting Pacing Amplitude: 2.375
Lead Channel Setting Pacing Amplitude: 2.5 V
Lead Channel Setting Pacing Pulse Width: 0.5 ms
Lead Channel Setting Pacing Pulse Width: 0.7 ms
Lead Channel Setting Sensing Sensitivity: 0.5 mV
Pulse Gen Serial Number: 111052020
Zone Setting Status: 755011

## 2023-04-27 ENCOUNTER — Encounter: Payer: Self-pay | Admitting: Cardiology

## 2023-05-29 ENCOUNTER — Telehealth: Payer: Self-pay | Admitting: Family Medicine

## 2023-05-29 ENCOUNTER — Telehealth: Payer: Self-pay | Admitting: Cardiology

## 2023-05-29 ENCOUNTER — Other Ambulatory Visit: Payer: Self-pay | Admitting: Family Medicine

## 2023-05-29 DIAGNOSIS — I10 Essential (primary) hypertension: Secondary | ICD-10-CM

## 2023-05-29 DIAGNOSIS — I5022 Chronic systolic (congestive) heart failure: Secondary | ICD-10-CM

## 2023-05-29 DIAGNOSIS — F411 Generalized anxiety disorder: Secondary | ICD-10-CM

## 2023-05-29 DIAGNOSIS — E785 Hyperlipidemia, unspecified: Secondary | ICD-10-CM

## 2023-05-29 DIAGNOSIS — Z952 Presence of prosthetic heart valve: Secondary | ICD-10-CM

## 2023-05-29 DIAGNOSIS — I2510 Atherosclerotic heart disease of native coronary artery without angina pectoris: Secondary | ICD-10-CM

## 2023-05-29 MED ORDER — METOPROLOL SUCCINATE ER 25 MG PO TB24: 25.0000 mg | ORAL_TABLET | Freq: Two times a day (BID) | ORAL | 3 refills | Status: AC

## 2023-05-29 MED ORDER — TRAMADOL HCL 50 MG PO TABS: 50.0000 mg | ORAL_TABLET | Freq: Three times a day (TID) | ORAL | 0 refills | Status: DC | PRN

## 2023-05-29 MED ORDER — SERTRALINE HCL 50 MG PO TABS: 50.0000 mg | ORAL_TABLET | Freq: Every day | ORAL | 0 refills | Status: DC

## 2023-05-29 MED ORDER — TRAZODONE HCL 50 MG PO TABS: 25.0000 mg | ORAL_TABLET | Freq: Every evening | ORAL | 0 refills | Status: AC | PRN

## 2023-05-29 MED ORDER — FUROSEMIDE 20 MG PO TABS: ORAL_TABLET | ORAL | 3 refills | Status: AC

## 2023-05-29 MED ORDER — ALPRAZOLAM 0.5 MG PO TABS: ORAL_TABLET | ORAL | 0 refills | Status: AC

## 2023-05-29 MED ORDER — BENZONATATE 200 MG PO CAPS: 200.0000 mg | ORAL_CAPSULE | Freq: Two times a day (BID) | ORAL | 0 refills | Status: AC | PRN

## 2023-05-29 MED ORDER — KETOCONAZOLE 2 % EX SHAM: 1.0000 | MEDICATED_SHAMPOO | CUTANEOUS | 0 refills | Status: AC

## 2023-05-29 MED ORDER — SPIRONOLACTONE 25 MG PO TABS: 25.0000 mg | ORAL_TABLET | Freq: Every day | ORAL | 3 refills | Status: AC

## 2023-05-29 MED ORDER — ALLOPURINOL 100 MG PO TABS: 100.0000 mg | ORAL_TABLET | Freq: Every day | ORAL | 0 refills | Status: DC

## 2023-05-29 MED ORDER — ATORVASTATIN CALCIUM 80 MG PO TABS: 80.0000 mg | ORAL_TABLET | Freq: Every day | ORAL | 0 refills | Status: AC

## 2023-05-29 MED ORDER — COLCHICINE 0.6 MG PO TABS: 1.2000 mg | ORAL_TABLET | Freq: Every day | ORAL | 0 refills | Status: DC

## 2023-05-29 NOTE — Telephone Encounter (Signed)
*  STAT* If patient is at the pharmacy, call can be transferred to refill team.   1. Which medications need to be refilled? (please list name of each medication and dose if known)   spironolactone  (ALDACTONE ) 25 MG tablet  metoprolol  succinate (TOPROL -XL) 25 MG 24 hr tablet  furosemide  (LASIX ) 20 MG tablet   DIFFERENT PHARMACY   4. Which pharmacy/location (including street and city if local pharmacy) is medication to be sent to?  [facility] [address] [place], [place] - [address] Phone: [phone]  Fax: [phone]       5. Do they need a 30 day or 90 day supply? 90

## 2023-05-29 NOTE — Addendum Note (Signed)
 Addended by: Lott Rouleau A on: 05/29/2023 10:34 AM   Modules accepted: Orders

## 2023-05-29 NOTE — Telephone Encounter (Unsigned)
 Copied from CRM [id number]. Topic: Clinical - Medication Refill >> May 29, 2023 10:57 AM Vivian Z wrote: Medication:  sertraline  (ZOLOFT ) 50 MG tablet allopurinol  (ZYLOPRIM ) 100 MG tablet colchicine  0.6 MG tablet  Has the patient contacted their pharmacy? Yes (Agent: If no, request that the patient contact the pharmacy for the refill. If patient does not wish to contact the pharmacy document the reason why and proceed with request.) (Agent: If yes, when and what did the pharmacy advise?) Patient is using a new pharmacy.  This is the patient's preferred pharmacy:  [facility] [address] [place], [place] - [address] [place] [address] Phone: [phone] Fax: [phone]  Is this the correct pharmacy for this prescription? Yes If no, delete pharmacy and type the correct one.   Has the prescription been filled recently? No  Is the patient out of the medication? Yes  Has the patient been seen for an appointment in the last year OR does the patient have an upcoming appointment? Yes  Can we respond through MyChart? No  Agent: Please be advised that Rx refills may take up to 3 business days. We ask that you follow-up with your pharmacy.

## 2023-05-29 NOTE — Telephone Encounter (Signed)
 Pt's medications were sent to pt's pharmacy as requested. Confirmation received.

## 2023-05-29 NOTE — Telephone Encounter (Signed)
 Please call and schedule Medicare Wellness with Cornelius Dill and CPE with fasting labs prior or same day if that works better.    Once scheduled, please send back to me to refill medication.     Last office visit 01/24/2023 for Right Arm Wound.  Last refilled: Alprazolam  11/01/2021 for #20 with no refills.   Tramadol  04/18/2023 for #90 with no refills.   Tessalon  Perles 01/09/2023 for #20 with no refills.   Ketoconazole  Shampoo 05/09/2020 for 120 ml with 1 refill.

## 2023-05-29 NOTE — Progress Notes (Signed)
 Remote ICD transmission.

## 2023-05-29 NOTE — Telephone Encounter (Signed)
 Copied from CRM [id number]. Topic: Clinical - Prescription Issue >> May 29, 2023 10:59 AM Magdalene School wrote: Reason for CRM: Patient would like all his prescriptions transferred to new pharmacy. Patient stated that he takes 15 different medications and unsure which ones are from his PCP ans which ones are from his specialist but he needs all active prescriptions sent to pharmacy listed below.  Patient contact number: [phone]  [facility] [address] [place], [place] - [address] [place] [address] Phone: [phone] Fax: [phone]

## 2023-05-30 NOTE — Telephone Encounter (Signed)
 Refills for these medication were sent in yesterday 05/29/2023.

## 2023-06-02 ENCOUNTER — Encounter: Payer: Self-pay | Admitting: Cardiology

## 2023-06-02 NOTE — Progress Notes (Unsigned)
 Electrophysiology Clinic Note    Date:  06/04/2023  Patient ID:  Carolos, Fecher [birth year], MRN [id number] PCP:  Judithann Novas, MD  Cardiologist:  Eilleen Grates, MD Electrophysiologist: Boyce Byes, MD   Discussed the use of AI scribe software for clinical note transcription with the patient, who gave verbal consent to proceed.   Patient Profile    Chief Complaint: routine device follow-up  History of Present Illness: Arjuna Doeden is a 73 y.o. male with PMH notable for parox AFib, HFrEF, CHB s/p PPM, CAD, AI s/p CABG/AVR with subsequent severe bioprosthetic valve dysfunction s/p valvuloplasty, thoracic aortic aneurysm, ; seen today for Boyce Byes, MD for routine electrophysiology followup.   He last saw Dr. Marven Slimmer 11/2021 at which time he was doing well, remained active at work without limitations.  He has seen PA Suaraz in AF clinic and NP Cleaver, most recently 03/2023 at which time he was having trouble affording eliquis  and entresto . He was doing well remaining active   On follow up today, he is doing well from a cardiac perspective.  He denies chest pain, pressure,  lower extremity edema. he continues to have shortness of breath with activities, particularly when climbing stairs.  Is able to walk on flat ground with very minimal symptoms.  He is not aware of any A-fib episodes, though does have sensation of his heart racing during arguments with his wife.  He takes Eliquis  twice daily, dose often forget his evening dose.  He has noticed that his skin is thin and he bleeds easily, no bleeding concerns through GI/GU tracts.  He and his wife have recently moved to [facility].    Arrhythmia/Device History St Jude BiV ICD, imp 10/2020; dx CHB, HFrEF   AAD -  Amiodarone  - stopped d/t no AF    ROS:  Please see the history of present illness. All other systems are reviewed and otherwise negative.    Physical Exam    VS:  BP (!) 122/56    Pulse (!) 140   Ht 5' 6.5" (1.689 m)   Wt 216 lb 3.2 oz (98.1 kg)   SpO2 95%   BMI 34.37 kg/m  BMI: Body mass index is 34.37 kg/m.  Wt Readings from Last 3 Encounters:  06/04/23 216 lb 3.2 oz (98.1 kg)  03/12/23 216 lb 3.2 oz (98.1 kg)  01/24/23 221 lb 8 oz (100.5 kg)     GEN- The patient is well appearing, alert and oriented x 3 today.   Lungs- Clear to ausculation bilaterally, normal work of breathing.  Heart- Regular rate and rhythm, no murmurs, rubs or gallops Extremities- Trace peripheral edema, warm, dry Skin-   device pocket well-healed, no tethering   Device interrogation done today and reviewed by myself:  Battery 4  years Lead thresholds, impedence, sensing stable  Frequent brief AMS episodes, some appear PMT and others  Aflutter. Overall AMS burden is < 1% No ventricular arrhythmias VP > 99% Optivol stable No changes made today   Studies Reviewed   Previous EP, cardiology notes.    EKG is ordered. Personal review of EKG from today shows:    EKG Interpretation Date/Time:  Tuesday June 04 2023 09:49:05 EDT Ventricular Rate:  140 PR Interval:  184 QRS Duration:  140 QT Interval:  250 QTC Calculation: 381 R Axis:   103  Text Interpretation: AV dual-paced rhythm Confirmed by Braelyn Jenson [phone]) on 06/04/2023 10:04:53 AM     TTE,  12/01/2021  1. Left ventricular ejection fraction, by estimation, is 60 to 65%. The left ventricle has normal function. The left ventricle has no regional wall motion abnormalities. There is mild left ventricular hypertrophy. Left ventricular diastolic parameters are consistent with Grade I diastolic dysfunction (impaired relaxation).   2. Right ventricular systolic function is normal. The right ventricular size is normal.   3. The mitral valve is normal in structure. No evidence of mitral valve regurgitation. No evidence of mitral stenosis.   4. The aortic valve is normal in structure. Aortic valve regurgitation is mild. No aortic  stenosis is present. Aortic valve mean gradient measures 13.0 mmHg. Aortic valve Vmax measures 2.42 m/s.   5. Aortic dilatation noted. There is moderate dilatation of the ascending aorta, measuring 45 mm.   6. The inferior vena cava is normal in size with greater than 50% respiratory variability, suggesting right atrial pressure of 3 mmHg.   Comparison(s): No significant change from prior study. Prior images reviewed side by side.   TTE, 05/24/2021  1. Left ventricular ejection fraction, by estimation, is 50 to 55%. The left ventricle has normal function. The left ventricle has no regional wall motion abnormalities. There is mild concentric left ventricular hypertrophy. Left ventricular diastolic parameters are consistent with Grade I diastolic dysfunction (impaired relaxation). Elevated left atrial pressure.   2. Right ventricular systolic function is normal. The right ventricular size is normal. There is normal pulmonary artery systolic pressure.   3. Left atrial size was moderately dilated.   4. The mitral valve is normal in structure. Mild mitral valve regurgitation.   5. There are two perivalvular aortic insufficiency jets. The larger is located anterolaterally (1 o'clock in short axis), with a smaller posteromedial jet (7 o'clock in short axis). The aortic valve has been repaired/replaced. Aortic valve regurgitation is moderate. There is a 25 mm bioprosthetic valve present in the aortic position. Procedure Date: 2021. Aortic regurgitation PHT measures 362 msec. Aortic valve mean gradient measures 16.0 mmHg. Aortic valve Vmax measures 2.60 m/s. Aortic valve acceleration time measures 77 msec.   6. Aortic dilatation noted. There is mild dilatation of the aortic root, measuring 43 mm. There is moderate dilatation of the ascending aorta, measuring 48 mm.   7. The inferior vena cava is normal in size with greater than 50% respiratory variability, suggesting right atrial pressure of 3 mmHg.    Comparison(s): No significant change from prior study. Prior images reviewed side by side. The aortic prosthesis gradients are slightly higher, which may suggest increased flow due to aortic insufficiency, but the aortic insufficiency PHT is unchanged.    Assessment and Plan     #) parox AFib Low burden via device Continue 25mg  toprol  BID  #) Hypercoag d/t parox afib CHA2DS2-VASc Score = at least 3 [CHF History: 0, HTN History: 1, Diabetes History: 0, Stroke History: 0, Vascular Disease History: 1, Age Score: 1, Gender Score: 0].  Therefore, the patient's annual risk of stroke is 3.2 %.    Stroke ppx - 5mg  eliquis  BID, appropriately dosed No bleeding concerns Encouraged him to use alarms to remember evening doses  #) ICM #) CHB s/p CRT-D Device functioning well, see paceart for details High VP > 99% No ventricular arrhythmias  #) HFimpEF Appears euvolemic on exam, warm and dry Most recent LVEF improved Follows regularly with Dr. Lavonne Prairie GDMT - 25mg  toprol , 97-103 entresto , 25mg  spiro         Current medicines are reviewed at length with the patient  today.   The patient does not have concerns regarding his medicines.  The following changes were made today:  none  Labs/ tests ordered today include:  Orders Placed This Encounter  Procedures   EKG 12-Lead     Disposition: Follow up with Dr. Marven Slimmer or EP APP in 12 months  He request recommendation for EP and general cardiology providers closer to home. Will msg Dr. Marven Slimmer and Dr. Lavonne Prairie   Signed, Adaline Holly, NP  06/04/23  4:40 PM  Electrophysiology [facility]

## 2023-06-04 ENCOUNTER — Ambulatory Visit (INDEPENDENT_AMBULATORY_CARE_PROVIDER_SITE_OTHER): Admitting: Cardiology

## 2023-06-04 ENCOUNTER — Encounter: Payer: Self-pay | Admitting: Cardiology

## 2023-06-04 ENCOUNTER — Ambulatory Visit (HOSPITAL_COMMUNITY)
Admission: RE | Admit: 2023-06-04 | Discharge: 2023-06-04 | Disposition: A | Discharge status: Home / Self Care | Source: Ambulatory Visit | Attending: General Practice | Admitting: General Practice

## 2023-06-04 VITALS — BP 122/56 | HR 140 | Ht 66.5 in | Wt 216.2 lb

## 2023-06-04 DIAGNOSIS — Z95810 Presence of automatic (implantable) cardiac defibrillator: Secondary | ICD-10-CM | POA: Insufficient documentation

## 2023-06-04 DIAGNOSIS — I5032 Chronic diastolic (congestive) heart failure: Secondary | ICD-10-CM | POA: Insufficient documentation

## 2023-06-04 DIAGNOSIS — I480 Paroxysmal atrial fibrillation: Secondary | ICD-10-CM | POA: Insufficient documentation

## 2023-06-04 DIAGNOSIS — Z952 Presence of prosthetic heart valve: Secondary | ICD-10-CM

## 2023-06-04 DIAGNOSIS — D6869 Other thrombophilia: Secondary | ICD-10-CM | POA: Diagnosis not present

## 2023-06-04 DIAGNOSIS — Z9889 Other specified postprocedural states: Secondary | ICD-10-CM | POA: Insufficient documentation

## 2023-06-04 LAB — CUP PACEART INCLINIC DEVICE CHECK
Battery Remaining Longevity: 50 mo
Brady Statistic RA Percent Paced: 87 %
Brady Statistic RV Percent Paced: 99.58 %
Date Time Interrogation Session: 20250603164834
HighPow Impedance: 60.75 Ohm
Implantable Lead Connection Status: 753985
Implantable Lead Connection Status: 753985
Implantable Lead Connection Status: 753985
Implantable Lead Implant Date: 20221020
Implantable Lead Implant Date: 20221020
Implantable Lead Implant Date: 20221020
Implantable Lead Location: 753858
Implantable Lead Location: 753859
Implantable Lead Location: 753860
Implantable Pulse Generator Implant Date: 20221020
Lead Channel Impedance Value: 350 Ohm
Lead Channel Impedance Value: 450 Ohm
Lead Channel Impedance Value: 737.5 Ohm
Lead Channel Pacing Threshold Amplitude: 0.5 V
Lead Channel Pacing Threshold Amplitude: 0.5 V
Lead Channel Pacing Threshold Amplitude: 0.5 V
Lead Channel Pacing Threshold Amplitude: 1 V
Lead Channel Pacing Threshold Amplitude: 1.75 V
Lead Channel Pacing Threshold Amplitude: 1.75 V
Lead Channel Pacing Threshold Pulse Width: 0.5 ms
Lead Channel Pacing Threshold Pulse Width: 0.5 ms
Lead Channel Pacing Threshold Pulse Width: 0.5 ms
Lead Channel Pacing Threshold Pulse Width: 0.5 ms
Lead Channel Pacing Threshold Pulse Width: 0.7 ms
Lead Channel Pacing Threshold Pulse Width: 0.7 ms
Lead Channel Sensing Intrinsic Amplitude: 1.9 mV
Lead Channel Sensing Intrinsic Amplitude: 7.6 mV
Lead Channel Setting Pacing Amplitude: 1.5 V
Lead Channel Setting Pacing Amplitude: 2.125
Lead Channel Setting Pacing Amplitude: 2.5 V
Lead Channel Setting Pacing Pulse Width: 0.5 ms
Lead Channel Setting Pacing Pulse Width: 0.7 ms
Lead Channel Setting Sensing Sensitivity: 0.5 mV
Pulse Gen Serial Number: 111052020
Zone Setting Status: 755011

## 2023-06-04 LAB — ECHOCARDIOGRAM COMPLETE
AR max vel: 2.67 cm2
AV Area VTI: 2.76 cm2
AV Area mean vel: 2.61 cm2
AV Mean grad: 14.3 mmHg
AV Peak grad: 26.8 mmHg
Ao pk vel: 2.59 m/s
Area-P 1/2: 2.87 cm2
Height: 66.5 in
P 1/2 time: 384 ms
S' Lateral: 4.6 cm
Weight: 3459.2 [oz_av]

## 2023-06-04 NOTE — Patient Instructions (Signed)
 Medication Instructions:  Your Physician recommend you continue on your current medication as directed.    *If you need a refill on your cardiac medications before your next appointment, please call your pharmacy*  Lab Work: None ordered at this time  If you have labs (blood work) drawn today and your tests are completely normal, you will receive your results only by: MyChart Message (if you have MyChart) OR A paper copy in the mail If you have any lab test that is abnormal or we need to change your treatment, we will call you to review the results.  Testing/Procedures: None ordered at this time   Follow-Up: At [facility], you and your health needs are our priority.  As part of our continuing mission to provide you with exceptional heart care, our providers are all part of one team.  This team includes your primary Cardiologist (physician) and Advanced Practice Providers or APPs (Physician Assistants and Nurse Practitioners) who all work together to provide you with the care you need, when you need it.  Your next appointment:   1 year(s)  Provider:   Suzann Riddle, NP  or EP team if care not transitioned to [facility] yet

## 2023-06-05 ENCOUNTER — Ambulatory Visit: Payer: Self-pay | Admitting: General Practice

## 2023-06-08 ENCOUNTER — Ambulatory Visit: Payer: Self-pay | Admitting: Cardiology

## 2023-07-11 ENCOUNTER — Encounter: Payer: Self-pay | Admitting: Hematology and Oncology

## 2023-07-15 ENCOUNTER — Other Ambulatory Visit: Payer: Self-pay

## 2023-07-15 MED ORDER — SACUBITRIL-VALSARTAN 97-103 MG PO TABS
1.0000 | ORAL_TABLET | Freq: Two times a day (BID) | ORAL | 2 refills | Status: AC
Start: 2023-07-15 — End: ?

## 2023-07-18 ENCOUNTER — Ambulatory Visit: Payer: Medicare HMO

## 2023-07-18 DIAGNOSIS — I420 Dilated cardiomyopathy: Secondary | ICD-10-CM

## 2023-07-19 LAB — CUP PACEART REMOTE DEVICE CHECK
Battery Remaining Longevity: 48 mo
Battery Remaining Percentage: 60 %
Battery Voltage: 2.95 V
Brady Statistic AP VP Percent: 87 %
Brady Statistic AP VS Percent: 1 %
Brady Statistic AS VP Percent: 12 %
Brady Statistic AS VS Percent: 1 %
Brady Statistic RA Percent Paced: 87 %
Date Time Interrogation Session: 20250717174618
HighPow Impedance: 57 Ohm
Implantable Lead Connection Status: 753985
Implantable Lead Connection Status: 753985
Implantable Lead Connection Status: 753985
Implantable Lead Implant Date: 20221020
Implantable Lead Implant Date: 20221020
Implantable Lead Implant Date: 20221020
Implantable Lead Location: 753858
Implantable Lead Location: 753859
Implantable Lead Location: 753860
Implantable Pulse Generator Implant Date: 20221020
Lead Channel Impedance Value: 330 Ohm
Lead Channel Impedance Value: 440 Ohm
Lead Channel Impedance Value: 690 Ohm
Lead Channel Pacing Threshold Amplitude: 0.5 V
Lead Channel Pacing Threshold Amplitude: 0.625 V
Lead Channel Pacing Threshold Amplitude: 1.5 V
Lead Channel Pacing Threshold Pulse Width: 0.5 ms
Lead Channel Pacing Threshold Pulse Width: 0.5 ms
Lead Channel Pacing Threshold Pulse Width: 0.7 ms
Lead Channel Sensing Intrinsic Amplitude: 2.1 mV
Lead Channel Sensing Intrinsic Amplitude: 8.9 mV
Lead Channel Setting Pacing Amplitude: 1.625
Lead Channel Setting Pacing Amplitude: 2 V
Lead Channel Setting Pacing Amplitude: 2.5 V
Lead Channel Setting Pacing Pulse Width: 0.5 ms
Lead Channel Setting Pacing Pulse Width: 0.7 ms
Lead Channel Setting Sensing Sensitivity: 0.5 mV
Pulse Gen Serial Number: 111052020
Zone Setting Status: 755011

## 2023-07-20 ENCOUNTER — Ambulatory Visit: Payer: Self-pay | Admitting: Cardiology

## 2023-07-25 ENCOUNTER — Other Ambulatory Visit: Payer: Self-pay | Admitting: Family Medicine

## 2023-07-25 NOTE — Telephone Encounter (Signed)
 Please schedule Medicare wellness with Steward Drone and CPE with fasting labs prior with Dr. Ermalene Searing.

## 2023-07-26 NOTE — Telephone Encounter (Signed)
 Lvm to schedule a AWV with NHA and CPE with Provider as well as prior fasting labs.

## 2023-09-05 ENCOUNTER — Other Ambulatory Visit: Payer: Self-pay | Admitting: Family Medicine

## 2023-09-05 DIAGNOSIS — F411 Generalized anxiety disorder: Secondary | ICD-10-CM

## 2023-09-05 NOTE — Telephone Encounter (Signed)
 Please schedule Medicare Wellness with Erminio and CPE with Dr. Avelina with fasting labs prior.  Please send back to me once scheduled to refill medication.

## 2023-09-06 NOTE — Telephone Encounter (Signed)
 I spoked with patient and he stated that he has moved and is planning to switch providers at his location since he is 4 hours away.

## 2023-09-24 ENCOUNTER — Other Ambulatory Visit (HOSPITAL_COMMUNITY): Payer: Self-pay | Admitting: Internal Medicine

## 2023-09-24 ENCOUNTER — Other Ambulatory Visit: Payer: Self-pay | Admitting: Family Medicine

## 2023-09-24 NOTE — Telephone Encounter (Signed)
 Last office visit 01/24/23 for arm wound.  Last refilled 05/29/2023 for #90 with no refills.  Next Appt: No future appointments with PCP.   Please call and schedule Medicare Wellness with Erminio and CPE with fasting labs with Dr. Avelina.

## 2023-09-25 ENCOUNTER — Encounter: Payer: Self-pay | Admitting: Hematology and Oncology

## 2023-09-25 ENCOUNTER — Telehealth: Payer: Self-pay

## 2023-09-25 ENCOUNTER — Other Ambulatory Visit (HOSPITAL_COMMUNITY): Payer: Self-pay

## 2023-09-25 NOTE — Telephone Encounter (Signed)
 Pharmacy Patient Advocate Encounter   Received notification from Onbase that prior authorization for Tramadol  50 is required/requested.   Insurance verification completed.   The patient is insured through [employer] .   Per test claim: PA required and submitted KEY/EOC/Request #: [id number] due to   Spoke with [facility]. 7 day opiate naive supply only option.

## 2023-09-26 NOTE — Telephone Encounter (Signed)
 LVM to schedule

## 2023-09-30 ENCOUNTER — Other Ambulatory Visit (HOSPITAL_COMMUNITY): Payer: Self-pay

## 2023-10-08 NOTE — Progress Notes (Signed)
 Remote ICD Transmission

## 2023-10-17 ENCOUNTER — Ambulatory Visit (INDEPENDENT_AMBULATORY_CARE_PROVIDER_SITE_OTHER): Payer: Medicare HMO

## 2023-10-17 ENCOUNTER — Other Ambulatory Visit: Payer: Self-pay | Admitting: Family Medicine

## 2023-10-17 DIAGNOSIS — I420 Dilated cardiomyopathy: Secondary | ICD-10-CM

## 2023-10-17 LAB — CUP PACEART REMOTE DEVICE CHECK
Battery Remaining Longevity: 46 mo
Battery Remaining Percentage: 56 %
Battery Voltage: 2.95 V
Brady Statistic AP VP Percent: 88 %
Brady Statistic AP VS Percent: 1 %
Brady Statistic AS VP Percent: 11 %
Brady Statistic AS VS Percent: 1 %
Brady Statistic RA Percent Paced: 88 %
Date Time Interrogation Session: 20251016020006
HighPow Impedance: 66 Ohm
Implantable Lead Connection Status: 753985
Implantable Lead Connection Status: 753985
Implantable Lead Connection Status: 753985
Implantable Lead Implant Date: 20221020
Implantable Lead Implant Date: 20221020
Implantable Lead Implant Date: 20221020
Implantable Lead Location: 753858
Implantable Lead Location: 753859
Implantable Lead Location: 753860
Implantable Pulse Generator Implant Date: 20221020
Lead Channel Impedance Value: 350 Ohm
Lead Channel Impedance Value: 460 Ohm
Lead Channel Impedance Value: 780 Ohm
Lead Channel Pacing Threshold Amplitude: 0.5 V
Lead Channel Pacing Threshold Amplitude: 0.625 V
Lead Channel Pacing Threshold Amplitude: 1.875 V
Lead Channel Pacing Threshold Pulse Width: 0.5 ms
Lead Channel Pacing Threshold Pulse Width: 0.5 ms
Lead Channel Pacing Threshold Pulse Width: 0.7 ms
Lead Channel Sensing Intrinsic Amplitude: 1.2 mV
Lead Channel Sensing Intrinsic Amplitude: 8.9 mV
Lead Channel Setting Pacing Amplitude: 1.625
Lead Channel Setting Pacing Amplitude: 2.375
Lead Channel Setting Pacing Amplitude: 2.5 V
Lead Channel Setting Pacing Pulse Width: 0.5 ms
Lead Channel Setting Pacing Pulse Width: 0.7 ms
Lead Channel Setting Sensing Sensitivity: 0.5 mV
Pulse Gen Serial Number: 111052020
Zone Setting Status: 755011

## 2023-10-17 NOTE — Telephone Encounter (Signed)
 Last office visit 01/24/23 for arm wound.  Last refilled 05/29/2023 for #90 with no refills.  Last Lipid 04/25/22. Next Appt: No future appointments with PCP.  Patient has been contacted by MyChart to schedule his annual physical and patient has reviewed message but no future appointments with PCP.

## 2023-10-19 ENCOUNTER — Other Ambulatory Visit (HOSPITAL_COMMUNITY): Payer: Self-pay | Admitting: Internal Medicine

## 2023-10-21 ENCOUNTER — Ambulatory Visit: Payer: Self-pay | Admitting: Cardiology

## 2023-10-23 NOTE — Progress Notes (Signed)
 Remote ICD Transmission

## 2023-10-24 ENCOUNTER — Other Ambulatory Visit: Payer: Self-pay | Admitting: Family Medicine

## 2023-10-28 ENCOUNTER — Other Ambulatory Visit: Payer: Self-pay | Admitting: Family Medicine

## 2023-11-04 ENCOUNTER — Other Ambulatory Visit: Payer: Self-pay | Admitting: Family Medicine

## 2023-11-05 ENCOUNTER — Telehealth: Payer: Self-pay

## 2023-11-05 NOTE — Telephone Encounter (Signed)
 Noted

## 2023-11-05 NOTE — Telephone Encounter (Signed)
 Called pt to set up follow up appt due to refill request of atorvastatin  but Pt stated has moved to new county and has new pcp.

## 2023-11-17 ENCOUNTER — Other Ambulatory Visit (HOSPITAL_COMMUNITY): Payer: Self-pay | Admitting: Internal Medicine

## 2023-11-18 ENCOUNTER — Other Ambulatory Visit: Payer: Self-pay | Admitting: Family Medicine

## 2023-12-03 ENCOUNTER — Other Ambulatory Visit: Payer: Self-pay | Admitting: Family Medicine

## 2023-12-03 DIAGNOSIS — F411 Generalized anxiety disorder: Secondary | ICD-10-CM

## 2023-12-04 ENCOUNTER — Other Ambulatory Visit: Payer: Self-pay | Admitting: Family Medicine

## 2023-12-04 DIAGNOSIS — F411 Generalized anxiety disorder: Secondary | ICD-10-CM

## 2023-12-16 ENCOUNTER — Other Ambulatory Visit: Payer: Self-pay | Admitting: Family Medicine

## 2023-12-16 DIAGNOSIS — F411 Generalized anxiety disorder: Secondary | ICD-10-CM

## 2023-12-30 ENCOUNTER — Other Ambulatory Visit: Payer: Self-pay | Admitting: Family Medicine

## 2024-01-15 ENCOUNTER — Telehealth (HOSPITAL_COMMUNITY): Payer: Self-pay | Admitting: Cardiology

## 2024-01-15 NOTE — Telephone Encounter (Signed)
 Reyes with [facility] called to request [place] release  Please call [phone] opt 1 to complete

## 2024-01-15 NOTE — Telephone Encounter (Signed)
 Released Pt in [place] as requested.  Attempted to return call to number listed, but was on hold.  No further action needed.

## 2024-01-16 ENCOUNTER — Ambulatory Visit: Payer: Self-pay | Attending: Cardiology

## 2024-01-16 ENCOUNTER — Ambulatory Visit: Payer: Self-pay

## 2024-01-16 DIAGNOSIS — I420 Dilated cardiomyopathy: Secondary | ICD-10-CM

## 2024-01-17 LAB — CUP PACEART REMOTE DEVICE CHECK
Battery Remaining Longevity: 42 mo
Battery Remaining Percentage: 53 %
Battery Voltage: 2.93 V
Brady Statistic AP VP Percent: 89 %
Brady Statistic AP VS Percent: 1 %
Brady Statistic AS VP Percent: 11 %
Brady Statistic AS VS Percent: 1 %
Brady Statistic RA Percent Paced: 89 %
Date Time Interrogation Session: 20260115020041
HighPow Impedance: 64 Ohm
Implantable Lead Connection Status: 753985
Implantable Lead Connection Status: 753985
Implantable Lead Connection Status: 753985
Implantable Lead Implant Date: 20221020
Implantable Lead Implant Date: 20221020
Implantable Lead Implant Date: 20221020
Implantable Lead Location: 753858
Implantable Lead Location: 753859
Implantable Lead Location: 753860
Implantable Pulse Generator Implant Date: 20221020
Lead Channel Impedance Value: 360 Ohm
Lead Channel Impedance Value: 460 Ohm
Lead Channel Impedance Value: 750 Ohm
Lead Channel Pacing Threshold Amplitude: 0.5 V
Lead Channel Pacing Threshold Amplitude: 0.625 V
Lead Channel Pacing Threshold Amplitude: 1.75 V
Lead Channel Pacing Threshold Pulse Width: 0.5 ms
Lead Channel Pacing Threshold Pulse Width: 0.5 ms
Lead Channel Pacing Threshold Pulse Width: 0.7 ms
Lead Channel Sensing Intrinsic Amplitude: 1.5 mV
Lead Channel Sensing Intrinsic Amplitude: 8.9 mV
Lead Channel Setting Pacing Amplitude: 1.625
Lead Channel Setting Pacing Amplitude: 2.25 V
Lead Channel Setting Pacing Amplitude: 2.5 V
Lead Channel Setting Pacing Pulse Width: 0.5 ms
Lead Channel Setting Pacing Pulse Width: 0.7 ms
Lead Channel Setting Sensing Sensitivity: 0.5 mV
Pulse Gen Serial Number: 111052020
Zone Setting Status: 755011

## 2024-01-19 ENCOUNTER — Ambulatory Visit: Payer: Self-pay | Admitting: Cardiology

## 2024-01-23 NOTE — Progress Notes (Signed)
 Remote ICD Transmission

## 2024-04-16 ENCOUNTER — Ambulatory Visit: Payer: Self-pay

## 2024-07-16 ENCOUNTER — Ambulatory Visit: Payer: Self-pay

## 2024-10-15 ENCOUNTER — Ambulatory Visit: Payer: Self-pay

## 2025-01-14 ENCOUNTER — Ambulatory Visit: Payer: Self-pay

## 2025-04-15 ENCOUNTER — Ambulatory Visit: Payer: Self-pay
